# Patient Record
Sex: Male | Born: 1945
Health system: Southern US, Community
[De-identification: ages and names within clinical notes are randomized; demographics above are authoritative.]

## PROBLEM LIST (undated history)

## (undated) DIAGNOSIS — K648 Other hemorrhoids: Secondary | ICD-10-CM

## (undated) DIAGNOSIS — R7302 Impaired glucose tolerance (oral): Secondary | ICD-10-CM

## (undated) DIAGNOSIS — M109 Gout, unspecified: Secondary | ICD-10-CM

## (undated) DIAGNOSIS — I442 Atrioventricular block, complete: Secondary | ICD-10-CM

## (undated) DIAGNOSIS — I779 Disorder of arteries and arterioles, unspecified: Secondary | ICD-10-CM

## (undated) DIAGNOSIS — I2510 Atherosclerotic heart disease of native coronary artery without angina pectoris: Secondary | ICD-10-CM

## (undated) DIAGNOSIS — R51 Headache: Secondary | ICD-10-CM

## (undated) DIAGNOSIS — I10 Essential (primary) hypertension: Secondary | ICD-10-CM

## (undated) DIAGNOSIS — M1990 Unspecified osteoarthritis, unspecified site: Secondary | ICD-10-CM

## (undated) DIAGNOSIS — IMO0002 Reserved for concepts with insufficient information to code with codable children: Secondary | ICD-10-CM

## (undated) DIAGNOSIS — I739 Peripheral vascular disease, unspecified: Secondary | ICD-10-CM

## (undated) DIAGNOSIS — K219 Gastro-esophageal reflux disease without esophagitis: Secondary | ICD-10-CM

## (undated) DIAGNOSIS — R609 Edema, unspecified: Secondary | ICD-10-CM

## (undated) DIAGNOSIS — I350 Nonrheumatic aortic (valve) stenosis: Secondary | ICD-10-CM

## (undated) DIAGNOSIS — E119 Type 2 diabetes mellitus without complications: Secondary | ICD-10-CM

## (undated) DIAGNOSIS — R519 Headache, unspecified: Secondary | ICD-10-CM

## (undated) DIAGNOSIS — E785 Hyperlipidemia, unspecified: Secondary | ICD-10-CM

## (undated) DIAGNOSIS — Z86010 Personal history of colonic polyps: Secondary | ICD-10-CM

## (undated) DIAGNOSIS — Z7901 Long term (current) use of anticoagulants: Secondary | ICD-10-CM

## (undated) DIAGNOSIS — N179 Acute kidney failure, unspecified: Secondary | ICD-10-CM

## (undated) DIAGNOSIS — K5730 Diverticulosis of large intestine without perforation or abscess without bleeding: Secondary | ICD-10-CM

## (undated) DIAGNOSIS — R9430 Abnormal result of cardiovascular function study, unspecified: Secondary | ICD-10-CM

## (undated) DIAGNOSIS — Z125 Encounter for screening for malignant neoplasm of prostate: Secondary | ICD-10-CM

## (undated) DIAGNOSIS — J45909 Unspecified asthma, uncomplicated: Secondary | ICD-10-CM

## (undated) HISTORY — PX: AORTIC VALVE REPLACEMENT: SHX41

## (undated) HISTORY — PX: COLONOSCOPY: SHX174

## (undated) HISTORY — DX: Essential (primary) hypertension: I10

## (undated) HISTORY — DX: Acute kidney failure, unspecified: N17.9

## (undated) HISTORY — DX: Diverticulosis of large intestine without perforation or abscess without bleeding: K57.30

## (undated) HISTORY — DX: Edema, unspecified: R60.9

## (undated) HISTORY — DX: Long term (current) use of anticoagulants: Z79.01

## (undated) HISTORY — DX: Impaired glucose tolerance (oral): R73.02

## (undated) HISTORY — DX: Disorder of arteries and arterioles, unspecified: I77.9

## (undated) HISTORY — DX: Reserved for concepts with insufficient information to code with codable children: IMO0002

## (undated) HISTORY — DX: Hyperlipidemia, unspecified: E78.5

## (undated) HISTORY — PX: SHOULDER SURGERY: SHX246

## (undated) HISTORY — DX: Encounter for screening for malignant neoplasm of prostate: Z12.5

## (undated) HISTORY — PX: CORONARY ARTERY BYPASS GRAFT: SHX141

## (undated) HISTORY — DX: Nonrheumatic aortic (valve) stenosis: I35.0

## (undated) HISTORY — PX: CIRCUMCISION: SUR203

## (undated) HISTORY — PX: ESOPHAGOGASTRODUODENOSCOPY: SHX1529

## (undated) HISTORY — PX: HERNIA REPAIR: SHX51

## (undated) HISTORY — PX: PACEMAKER INSERTION: SHX728

## (undated) HISTORY — DX: Other hemorrhoids: K64.8

## (undated) HISTORY — DX: Abnormal result of cardiovascular function study, unspecified: R94.30

## (undated) HISTORY — DX: Atherosclerotic heart disease of native coronary artery without angina pectoris: I25.10

## (undated) HISTORY — DX: Unspecified osteoarthritis, unspecified site: M19.90

## (undated) HISTORY — PX: OTHER SURGICAL HISTORY: SHX169

## (undated) HISTORY — DX: Peripheral vascular disease, unspecified: I73.9

## (undated) HISTORY — DX: Gout, unspecified: M10.9

## (undated) HISTORY — DX: Atrioventricular block, complete: I44.2

## (undated) HISTORY — DX: Type 2 diabetes mellitus without complications: E11.9

## (undated) HISTORY — DX: Unspecified asthma, uncomplicated: J45.909

---
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
## 1997-12-10 ENCOUNTER — Encounter: Admission: RE | Admit: 1997-12-10 | Discharge: 1998-03-10 | Payer: Self-pay | Admitting: Internal Medicine

## 2000-06-20 ENCOUNTER — Ambulatory Visit (HOSPITAL_COMMUNITY): Admission: RE | Admit: 2000-06-20 | Discharge: 2000-06-20 | Payer: Self-pay | Admitting: Internal Medicine

## 2000-06-20 ENCOUNTER — Encounter: Payer: Self-pay | Admitting: Internal Medicine

## 2001-02-18 ENCOUNTER — Emergency Department (HOSPITAL_COMMUNITY): Admission: EM | Admit: 2001-02-18 | Discharge: 2001-02-18 | Payer: Self-pay | Admitting: Emergency Medicine

## 2001-02-18 ENCOUNTER — Encounter: Payer: Self-pay | Admitting: Emergency Medicine

## 2001-08-24 ENCOUNTER — Encounter: Payer: Self-pay | Admitting: Emergency Medicine

## 2001-08-24 ENCOUNTER — Emergency Department (HOSPITAL_COMMUNITY): Admission: EM | Admit: 2001-08-24 | Discharge: 2001-08-24 | Payer: Self-pay | Admitting: Emergency Medicine

## 2003-06-12 HISTORY — PX: CARDIAC CATHETERIZATION: SHX172

## 2003-06-28 ENCOUNTER — Ambulatory Visit (HOSPITAL_COMMUNITY): Admission: RE | Admit: 2003-06-28 | Discharge: 2003-06-29 | Payer: Self-pay | Admitting: Cardiology

## 2004-02-10 ENCOUNTER — Inpatient Hospital Stay (HOSPITAL_COMMUNITY): Admission: RE | Admit: 2004-02-10 | Discharge: 2004-02-28 | Payer: Self-pay | Admitting: Cardiology

## 2004-02-11 ENCOUNTER — Encounter: Payer: Self-pay | Admitting: Cardiovascular Disease

## 2004-02-18 ENCOUNTER — Encounter: Payer: Self-pay | Admitting: Thoracic Surgery (Cardiothoracic Vascular Surgery)

## 2004-02-18 ENCOUNTER — Encounter (INDEPENDENT_AMBULATORY_CARE_PROVIDER_SITE_OTHER): Payer: Self-pay | Admitting: *Deleted

## 2004-03-06 ENCOUNTER — Encounter: Admission: RE | Admit: 2004-03-06 | Discharge: 2004-03-06 | Payer: Self-pay | Admitting: Cardiology

## 2004-03-13 ENCOUNTER — Emergency Department (HOSPITAL_COMMUNITY): Admission: EM | Admit: 2004-03-13 | Discharge: 2004-03-13 | Payer: Self-pay

## 2004-03-23 ENCOUNTER — Encounter (HOSPITAL_COMMUNITY): Admission: RE | Admit: 2004-03-23 | Discharge: 2004-04-10 | Payer: Self-pay | Admitting: Cardiology

## 2004-04-13 ENCOUNTER — Encounter (HOSPITAL_COMMUNITY): Admission: RE | Admit: 2004-04-13 | Discharge: 2004-05-13 | Payer: Self-pay | Admitting: Cardiology

## 2004-05-11 ENCOUNTER — Encounter
Admission: RE | Admit: 2004-05-11 | Discharge: 2004-05-11 | Payer: Self-pay | Admitting: Thoracic Surgery (Cardiothoracic Vascular Surgery)

## 2004-05-15 ENCOUNTER — Encounter (HOSPITAL_COMMUNITY): Admission: RE | Admit: 2004-05-15 | Discharge: 2004-06-14 | Payer: Self-pay | Admitting: Cardiology

## 2004-05-27 ENCOUNTER — Ambulatory Visit: Payer: Self-pay | Admitting: Cardiology

## 2004-05-27 ENCOUNTER — Ambulatory Visit: Payer: Self-pay | Admitting: *Deleted

## 2004-06-09 ENCOUNTER — Ambulatory Visit: Payer: Self-pay | Admitting: Internal Medicine

## 2004-06-15 ENCOUNTER — Encounter (HOSPITAL_COMMUNITY): Admission: RE | Admit: 2004-06-15 | Discharge: 2004-07-10 | Payer: Self-pay | Admitting: Cardiology

## 2004-06-25 ENCOUNTER — Ambulatory Visit: Payer: Self-pay | Admitting: *Deleted

## 2004-07-23 ENCOUNTER — Ambulatory Visit: Payer: Self-pay | Admitting: Internal Medicine

## 2004-07-29 ENCOUNTER — Ambulatory Visit: Payer: Self-pay | Admitting: Internal Medicine

## 2004-08-20 ENCOUNTER — Ambulatory Visit: Payer: Self-pay | Admitting: Cardiology

## 2004-08-31 ENCOUNTER — Ambulatory Visit: Payer: Self-pay | Admitting: Internal Medicine

## 2004-09-10 ENCOUNTER — Ambulatory Visit: Payer: Self-pay | Admitting: Cardiology

## 2004-10-08 ENCOUNTER — Ambulatory Visit: Payer: Self-pay | Admitting: Internal Medicine

## 2004-10-29 ENCOUNTER — Ambulatory Visit: Payer: Self-pay | Admitting: Cardiology

## 2004-11-26 ENCOUNTER — Ambulatory Visit: Payer: Self-pay | Admitting: Cardiology

## 2004-11-27 ENCOUNTER — Ambulatory Visit: Payer: Self-pay | Admitting: Internal Medicine

## 2004-12-25 ENCOUNTER — Ambulatory Visit: Payer: Self-pay | Admitting: Cardiology

## 2005-01-15 ENCOUNTER — Ambulatory Visit: Payer: Self-pay | Admitting: Cardiology

## 2005-02-05 ENCOUNTER — Ambulatory Visit: Payer: Self-pay | Admitting: Cardiology

## 2005-03-05 ENCOUNTER — Ambulatory Visit: Payer: Self-pay | Admitting: Cardiology

## 2005-03-19 ENCOUNTER — Ambulatory Visit: Payer: Self-pay | Admitting: Internal Medicine

## 2005-04-01 ENCOUNTER — Ambulatory Visit: Payer: Self-pay | Admitting: Cardiology

## 2005-04-30 ENCOUNTER — Ambulatory Visit: Payer: Self-pay | Admitting: Internal Medicine

## 2005-04-30 ENCOUNTER — Ambulatory Visit: Payer: Self-pay | Admitting: Cardiovascular Disease

## 2005-05-27 ENCOUNTER — Ambulatory Visit: Payer: Self-pay | Admitting: Internal Medicine

## 2005-06-11 ENCOUNTER — Ambulatory Visit: Payer: Self-pay | Admitting: Internal Medicine

## 2005-06-17 ENCOUNTER — Ambulatory Visit: Payer: Self-pay | Admitting: Cardiology

## 2005-06-18 ENCOUNTER — Ambulatory Visit: Payer: Self-pay | Admitting: Internal Medicine

## 2005-07-01 ENCOUNTER — Ambulatory Visit: Payer: Self-pay | Admitting: Cardiology

## 2005-07-22 ENCOUNTER — Ambulatory Visit: Payer: Self-pay

## 2005-08-19 ENCOUNTER — Ambulatory Visit: Payer: Self-pay | Admitting: Internal Medicine

## 2005-09-16 ENCOUNTER — Ambulatory Visit: Payer: Self-pay | Admitting: Cardiology

## 2005-10-07 ENCOUNTER — Ambulatory Visit: Payer: Self-pay | Admitting: Internal Medicine

## 2005-10-14 ENCOUNTER — Ambulatory Visit: Payer: Self-pay | Admitting: Cardiology

## 2005-11-04 ENCOUNTER — Ambulatory Visit: Payer: Self-pay | Admitting: Cardiology

## 2005-12-02 ENCOUNTER — Ambulatory Visit: Payer: Self-pay | Admitting: Cardiology

## 2005-12-17 ENCOUNTER — Ambulatory Visit: Payer: Self-pay | Admitting: Internal Medicine

## 2005-12-23 ENCOUNTER — Ambulatory Visit: Payer: Self-pay | Admitting: Cardiology

## 2006-01-20 ENCOUNTER — Ambulatory Visit: Payer: Self-pay | Admitting: Cardiology

## 2006-01-24 ENCOUNTER — Ambulatory Visit: Payer: Self-pay

## 2006-02-03 ENCOUNTER — Ambulatory Visit: Payer: Self-pay | Admitting: Internal Medicine

## 2006-03-03 ENCOUNTER — Ambulatory Visit: Payer: Self-pay | Admitting: Cardiology

## 2006-03-31 ENCOUNTER — Ambulatory Visit: Payer: Self-pay | Admitting: Cardiology

## 2006-04-22 ENCOUNTER — Ambulatory Visit: Payer: Self-pay | Admitting: Internal Medicine

## 2006-04-28 ENCOUNTER — Ambulatory Visit: Payer: Self-pay | Admitting: Internal Medicine

## 2006-05-26 ENCOUNTER — Ambulatory Visit: Payer: Self-pay | Admitting: Cardiology

## 2006-06-14 ENCOUNTER — Ambulatory Visit: Payer: Self-pay | Admitting: Internal Medicine

## 2006-06-14 LAB — CONVERTED CEMR LAB
ALT: 22 units/L (ref 0–40)
AST: 28 units/L (ref 0–37)
Basophils Absolute: 0 10*3/uL (ref 0.0–0.1)
CO2: 32 meq/L (ref 19–32)
Calcium: 9.1 mg/dL (ref 8.4–10.5)
Chloride: 102 meq/L (ref 96–112)
Chol/HDL Ratio, serum: 3
Eosinophil percent: 1.2 % (ref 0.0–5.0)
Glucose, Bld: 96 mg/dL (ref 70–99)
LDL Cholesterol: 63 mg/dL (ref 0–99)
Lymphocytes Relative: 14.5 % (ref 12.0–46.0)
Monocytes Absolute: 0.7 10*3/uL (ref 0.2–0.7)
Platelets: 190 10*3/uL (ref 150–400)
Potassium: 3.9 meq/L (ref 3.5–5.1)
Sodium: 140 meq/L (ref 135–145)
Total Bilirubin: 0.9 mg/dL (ref 0.3–1.2)
VLDL: 18 mg/dL (ref 0–40)
WBC: 9.9 10*3/uL (ref 4.5–10.5)

## 2006-06-21 ENCOUNTER — Ambulatory Visit: Payer: Self-pay | Admitting: Internal Medicine

## 2006-06-23 ENCOUNTER — Ambulatory Visit: Payer: Self-pay | Admitting: Cardiology

## 2006-06-28 ENCOUNTER — Ambulatory Visit: Payer: Self-pay | Admitting: Internal Medicine

## 2006-07-21 ENCOUNTER — Ambulatory Visit: Payer: Self-pay | Admitting: Cardiology

## 2006-08-05 ENCOUNTER — Ambulatory Visit: Payer: Self-pay | Admitting: Internal Medicine

## 2006-08-12 ENCOUNTER — Ambulatory Visit: Payer: Self-pay | Admitting: Internal Medicine

## 2006-08-18 ENCOUNTER — Ambulatory Visit: Payer: Self-pay | Admitting: Cardiology

## 2006-08-29 ENCOUNTER — Ambulatory Visit: Payer: Self-pay | Admitting: Internal Medicine

## 2006-08-29 ENCOUNTER — Ambulatory Visit: Payer: Self-pay

## 2006-08-31 ENCOUNTER — Ambulatory Visit: Payer: Self-pay | Admitting: Internal Medicine

## 2006-09-08 ENCOUNTER — Ambulatory Visit: Payer: Self-pay | Admitting: *Deleted

## 2006-09-20 ENCOUNTER — Ambulatory Visit: Payer: Self-pay | Admitting: Internal Medicine

## 2006-10-06 ENCOUNTER — Ambulatory Visit: Payer: Self-pay | Admitting: Cardiology

## 2006-10-10 ENCOUNTER — Ambulatory Visit: Payer: Self-pay | Admitting: Internal Medicine

## 2006-11-03 ENCOUNTER — Ambulatory Visit: Payer: Self-pay | Admitting: Cardiology

## 2006-11-07 ENCOUNTER — Ambulatory Visit: Payer: Self-pay | Admitting: Internal Medicine

## 2006-12-01 ENCOUNTER — Ambulatory Visit: Payer: Self-pay | Admitting: Cardiology

## 2006-12-06 ENCOUNTER — Ambulatory Visit: Payer: Self-pay | Admitting: Internal Medicine

## 2006-12-29 ENCOUNTER — Ambulatory Visit: Payer: Self-pay | Admitting: Internal Medicine

## 2007-01-02 ENCOUNTER — Ambulatory Visit: Payer: Self-pay | Admitting: Internal Medicine

## 2007-01-16 ENCOUNTER — Encounter: Payer: Self-pay | Admitting: Internal Medicine

## 2007-01-16 DIAGNOSIS — M109 Gout, unspecified: Secondary | ICD-10-CM

## 2007-01-16 DIAGNOSIS — I25119 Atherosclerotic heart disease of native coronary artery with unspecified angina pectoris: Secondary | ICD-10-CM | POA: Insufficient documentation

## 2007-01-16 DIAGNOSIS — E785 Hyperlipidemia, unspecified: Secondary | ICD-10-CM | POA: Insufficient documentation

## 2007-01-16 DIAGNOSIS — I25810 Atherosclerosis of coronary artery bypass graft(s) without angina pectoris: Secondary | ICD-10-CM | POA: Insufficient documentation

## 2007-01-16 DIAGNOSIS — I10 Essential (primary) hypertension: Secondary | ICD-10-CM

## 2007-01-16 DIAGNOSIS — I2510 Atherosclerotic heart disease of native coronary artery without angina pectoris: Secondary | ICD-10-CM

## 2007-01-16 HISTORY — DX: Hyperlipidemia, unspecified: E78.5

## 2007-01-16 HISTORY — DX: Essential (primary) hypertension: I10

## 2007-01-16 HISTORY — DX: Gout, unspecified: M10.9

## 2007-01-17 ENCOUNTER — Ambulatory Visit: Payer: Self-pay | Admitting: Internal Medicine

## 2007-01-30 ENCOUNTER — Ambulatory Visit: Payer: Self-pay | Admitting: Internal Medicine

## 2007-01-30 ENCOUNTER — Ambulatory Visit: Payer: Self-pay | Admitting: Cardiology

## 2007-02-14 ENCOUNTER — Ambulatory Visit: Payer: Self-pay | Admitting: Internal Medicine

## 2007-02-20 ENCOUNTER — Ambulatory Visit: Payer: Self-pay | Admitting: Internal Medicine

## 2007-02-27 ENCOUNTER — Ambulatory Visit: Payer: Self-pay | Admitting: Internal Medicine

## 2007-03-15 ENCOUNTER — Ambulatory Visit: Payer: Self-pay | Admitting: Internal Medicine

## 2007-03-27 ENCOUNTER — Ambulatory Visit: Payer: Self-pay | Admitting: Internal Medicine

## 2007-04-13 ENCOUNTER — Ambulatory Visit: Payer: Self-pay | Admitting: Cardiology

## 2007-04-24 ENCOUNTER — Ambulatory Visit: Payer: Self-pay | Admitting: Internal Medicine

## 2007-05-11 ENCOUNTER — Ambulatory Visit: Payer: Self-pay | Admitting: Internal Medicine

## 2007-05-18 ENCOUNTER — Ambulatory Visit: Payer: Self-pay | Admitting: Internal Medicine

## 2007-05-22 ENCOUNTER — Ambulatory Visit: Payer: Self-pay | Admitting: Internal Medicine

## 2007-06-14 ENCOUNTER — Ambulatory Visit: Payer: Self-pay | Admitting: Internal Medicine

## 2007-06-19 ENCOUNTER — Ambulatory Visit: Payer: Self-pay | Admitting: Internal Medicine

## 2007-06-29 ENCOUNTER — Ambulatory Visit: Payer: Self-pay | Admitting: Cardiology

## 2007-07-17 ENCOUNTER — Ambulatory Visit: Payer: Self-pay | Admitting: Internal Medicine

## 2007-07-27 ENCOUNTER — Ambulatory Visit: Payer: Self-pay | Admitting: Internal Medicine

## 2007-08-14 ENCOUNTER — Ambulatory Visit: Payer: Self-pay

## 2007-08-24 ENCOUNTER — Ambulatory Visit: Payer: Self-pay | Admitting: Cardiology

## 2007-08-31 ENCOUNTER — Encounter: Payer: Self-pay | Admitting: Internal Medicine

## 2007-09-21 ENCOUNTER — Ambulatory Visit: Payer: Self-pay | Admitting: Cardiology

## 2007-10-16 ENCOUNTER — Ambulatory Visit: Payer: Self-pay | Admitting: Internal Medicine

## 2007-10-17 ENCOUNTER — Ambulatory Visit: Payer: Self-pay | Admitting: Internal Medicine

## 2007-10-17 DIAGNOSIS — K5730 Diverticulosis of large intestine without perforation or abscess without bleeding: Secondary | ICD-10-CM | POA: Insufficient documentation

## 2007-10-17 HISTORY — DX: Diverticulosis of large intestine without perforation or abscess without bleeding: K57.30

## 2007-10-17 LAB — CONVERTED CEMR LAB
AST: 33 units/L (ref 0–37)
Albumin: 4 g/dL (ref 3.5–5.2)
Basophils Relative: 0.1 % (ref 0.0–1.0)
Bilirubin, Direct: 0.1 mg/dL (ref 0.0–0.3)
Calcium: 9.5 mg/dL (ref 8.4–10.5)
Chloride: 106 meq/L (ref 96–112)
Cholesterol: 123 mg/dL (ref 0–200)
Eosinophils Absolute: 0.1 10*3/uL (ref 0.0–0.7)
GFR calc Af Amer: 87 mL/min
Glucose, Bld: 77 mg/dL (ref 70–99)
Hemoglobin: 14.4 g/dL (ref 13.0–17.0)
Lymphocytes Relative: 24.2 % (ref 12.0–46.0)
MCHC: 33.1 g/dL (ref 30.0–36.0)
MCV: 88.5 fL (ref 78.0–100.0)
Monocytes Relative: 8.5 % (ref 3.0–12.0)
Platelets: 155 10*3/uL (ref 150–400)
RBC: 4.9 M/uL (ref 4.22–5.81)
RDW: 13.4 % (ref 11.5–14.6)
TSH: 2.01 microintl units/mL (ref 0.35–5.50)
Total Bilirubin: 0.9 mg/dL (ref 0.3–1.2)
Triglycerides: 64 mg/dL (ref 0–149)
VLDL: 13 mg/dL (ref 0–40)

## 2007-10-19 ENCOUNTER — Ambulatory Visit: Payer: Self-pay

## 2007-11-16 ENCOUNTER — Ambulatory Visit: Payer: Self-pay | Admitting: Internal Medicine

## 2007-12-14 ENCOUNTER — Ambulatory Visit: Payer: Self-pay | Admitting: Internal Medicine

## 2008-01-10 ENCOUNTER — Ambulatory Visit: Payer: Self-pay | Admitting: Internal Medicine

## 2008-02-07 ENCOUNTER — Ambulatory Visit: Payer: Self-pay | Admitting: Cardiology

## 2008-02-16 ENCOUNTER — Ambulatory Visit: Payer: Self-pay | Admitting: Internal Medicine

## 2008-03-04 ENCOUNTER — Ambulatory Visit: Payer: Self-pay | Admitting: Internal Medicine

## 2008-03-12 ENCOUNTER — Ambulatory Visit: Payer: Self-pay | Admitting: Internal Medicine

## 2008-04-01 ENCOUNTER — Ambulatory Visit: Payer: Self-pay | Admitting: Internal Medicine

## 2008-04-04 ENCOUNTER — Ambulatory Visit: Payer: Self-pay | Admitting: Internal Medicine

## 2008-04-29 ENCOUNTER — Ambulatory Visit: Payer: Self-pay | Admitting: Cardiovascular Disease

## 2008-05-14 ENCOUNTER — Telehealth: Payer: Self-pay | Admitting: Internal Medicine

## 2008-05-17 ENCOUNTER — Ambulatory Visit: Payer: Self-pay | Admitting: Internal Medicine

## 2008-05-27 ENCOUNTER — Ambulatory Visit: Payer: Self-pay | Admitting: Cardiology

## 2008-05-28 ENCOUNTER — Telehealth: Payer: Self-pay | Admitting: Internal Medicine

## 2008-05-31 ENCOUNTER — Telehealth: Payer: Self-pay | Admitting: Internal Medicine

## 2008-06-24 ENCOUNTER — Ambulatory Visit: Payer: Self-pay | Admitting: Internal Medicine

## 2008-07-17 ENCOUNTER — Ambulatory Visit: Payer: Self-pay | Admitting: Internal Medicine

## 2008-07-17 ENCOUNTER — Telehealth: Payer: Self-pay | Admitting: Internal Medicine

## 2008-07-31 ENCOUNTER — Telehealth: Payer: Self-pay | Admitting: Internal Medicine

## 2008-08-01 ENCOUNTER — Ambulatory Visit: Payer: Self-pay | Admitting: Cardiology

## 2008-08-16 ENCOUNTER — Ambulatory Visit: Payer: Self-pay | Admitting: Internal Medicine

## 2008-08-29 ENCOUNTER — Ambulatory Visit: Payer: Self-pay | Admitting: Internal Medicine

## 2008-09-12 ENCOUNTER — Encounter: Payer: Self-pay | Admitting: Internal Medicine

## 2008-09-19 ENCOUNTER — Ambulatory Visit: Payer: Self-pay

## 2008-09-26 ENCOUNTER — Ambulatory Visit: Payer: Self-pay | Admitting: Internal Medicine

## 2008-09-27 ENCOUNTER — Ambulatory Visit: Payer: Self-pay | Admitting: Internal Medicine

## 2008-10-24 ENCOUNTER — Ambulatory Visit: Payer: Self-pay | Admitting: Cardiology

## 2008-10-24 ENCOUNTER — Ambulatory Visit: Payer: Self-pay

## 2008-11-01 DIAGNOSIS — K648 Other hemorrhoids: Secondary | ICD-10-CM

## 2008-11-01 DIAGNOSIS — Z950 Presence of cardiac pacemaker: Secondary | ICD-10-CM | POA: Insufficient documentation

## 2008-11-01 DIAGNOSIS — I442 Atrioventricular block, complete: Secondary | ICD-10-CM

## 2008-11-01 HISTORY — DX: Other hemorrhoids: K64.8

## 2008-11-13 ENCOUNTER — Ambulatory Visit: Payer: Self-pay | Admitting: Internal Medicine

## 2008-11-18 ENCOUNTER — Ambulatory Visit: Payer: Self-pay | Admitting: Internal Medicine

## 2008-11-27 ENCOUNTER — Ambulatory Visit: Payer: Self-pay | Admitting: Internal Medicine

## 2008-11-27 DIAGNOSIS — R609 Edema, unspecified: Secondary | ICD-10-CM

## 2008-11-27 HISTORY — DX: Edema, unspecified: R60.9

## 2008-12-10 ENCOUNTER — Encounter: Payer: Self-pay | Admitting: *Deleted

## 2008-12-16 ENCOUNTER — Ambulatory Visit: Payer: Self-pay | Admitting: Cardiology

## 2008-12-16 LAB — CONVERTED CEMR LAB
POC INR: 4.3
Protime: 25.1

## 2008-12-26 ENCOUNTER — Ambulatory Visit: Payer: Self-pay | Admitting: Internal Medicine

## 2008-12-26 LAB — CONVERTED CEMR LAB
POC INR: 2.4
Protime: 19

## 2008-12-28 ENCOUNTER — Ambulatory Visit: Payer: Self-pay | Admitting: Internal Medicine

## 2009-01-15 ENCOUNTER — Encounter: Payer: Self-pay | Admitting: *Deleted

## 2009-01-16 ENCOUNTER — Ambulatory Visit: Payer: Self-pay | Admitting: Cardiology

## 2009-02-13 ENCOUNTER — Ambulatory Visit: Payer: Self-pay | Admitting: Cardiology

## 2009-02-13 LAB — CONVERTED CEMR LAB: Prothrombin Time: 20.4 s

## 2009-02-18 ENCOUNTER — Encounter: Payer: Self-pay | Admitting: Cardiology

## 2009-03-06 ENCOUNTER — Telehealth: Payer: Self-pay | Admitting: Internal Medicine

## 2009-03-18 ENCOUNTER — Telehealth (INDEPENDENT_AMBULATORY_CARE_PROVIDER_SITE_OTHER): Payer: Self-pay | Admitting: *Deleted

## 2009-03-25 ENCOUNTER — Ambulatory Visit: Payer: Self-pay | Admitting: Cardiology

## 2009-03-27 ENCOUNTER — Ambulatory Visit: Payer: Self-pay | Admitting: Internal Medicine

## 2009-03-31 ENCOUNTER — Ambulatory Visit: Payer: Self-pay | Admitting: Internal Medicine

## 2009-04-22 ENCOUNTER — Ambulatory Visit: Payer: Self-pay | Admitting: Cardiology

## 2009-04-22 LAB — CONVERTED CEMR LAB: POC INR: 2.6

## 2009-05-09 ENCOUNTER — Ambulatory Visit: Payer: Self-pay | Admitting: Internal Medicine

## 2009-05-23 ENCOUNTER — Ambulatory Visit: Payer: Self-pay | Admitting: Internal Medicine

## 2009-05-23 ENCOUNTER — Ambulatory Visit: Payer: Self-pay | Admitting: Cardiology

## 2009-05-23 DIAGNOSIS — Z954 Presence of other heart-valve replacement: Secondary | ICD-10-CM

## 2009-05-23 DIAGNOSIS — Z952 Presence of prosthetic heart valve: Secondary | ICD-10-CM | POA: Insufficient documentation

## 2009-05-23 LAB — CONVERTED CEMR LAB: POC INR: 2.9

## 2009-06-19 ENCOUNTER — Ambulatory Visit: Payer: Self-pay | Admitting: Internal Medicine

## 2009-06-19 DIAGNOSIS — R42 Dizziness and giddiness: Secondary | ICD-10-CM

## 2009-06-19 LAB — CONVERTED CEMR LAB
AST: 42 units/L — ABNORMAL HIGH (ref 0–37)
Alkaline Phosphatase: 50 units/L (ref 39–117)
BUN: 16 mg/dL (ref 6–23)
Basophils Relative: 0.4 % (ref 0.0–3.0)
Bilirubin, Direct: 0 mg/dL (ref 0.0–0.3)
Creatinine, Ser: 1 mg/dL (ref 0.4–1.5)
Eosinophils Absolute: 0.1 10*3/uL (ref 0.0–0.7)
GFR calc non Af Amer: 79.71 mL/min (ref >60)
Glucose, Bld: 112 mg/dL — ABNORMAL HIGH (ref 70–99)
Hemoglobin: 14.4 g/dL (ref 13.0–17.0)
MCV: 89.4 fL (ref 78.0–100.0)
Monocytes Absolute: 0.4 10*3/uL (ref 0.1–1.0)
Neutro Abs: 3.6 10*3/uL (ref 1.4–7.7)
Platelets: 155 10*3/uL (ref 150.0–400.0)
Prothrombin Time: 21.2 s
Sodium: 140 meq/L (ref 135–145)
TSH: 1.28 microintl units/mL (ref 0.35–5.50)
Total Protein: 6.4 g/dL (ref 6.0–8.3)

## 2009-06-30 ENCOUNTER — Ambulatory Visit: Payer: Self-pay | Admitting: Internal Medicine

## 2009-07-01 ENCOUNTER — Telehealth: Payer: Self-pay | Admitting: Internal Medicine

## 2009-07-07 ENCOUNTER — Encounter (INDEPENDENT_AMBULATORY_CARE_PROVIDER_SITE_OTHER): Payer: Self-pay | Admitting: *Deleted

## 2009-07-07 ENCOUNTER — Ambulatory Visit: Payer: Self-pay | Admitting: Cardiology

## 2009-07-07 LAB — CONVERTED CEMR LAB: POC INR: 2.6

## 2009-08-04 ENCOUNTER — Ambulatory Visit: Payer: Self-pay | Admitting: Internal Medicine

## 2009-08-04 LAB — CONVERTED CEMR LAB: POC INR: 2.5

## 2009-08-21 ENCOUNTER — Encounter: Payer: Self-pay | Admitting: Cardiology

## 2009-08-21 ENCOUNTER — Encounter: Payer: Self-pay | Admitting: Internal Medicine

## 2009-08-27 ENCOUNTER — Encounter: Payer: Self-pay | Admitting: Cardiology

## 2009-08-28 ENCOUNTER — Ambulatory Visit: Payer: Self-pay | Admitting: Cardiology

## 2009-09-11 ENCOUNTER — Ambulatory Visit: Payer: Self-pay

## 2009-09-11 ENCOUNTER — Ambulatory Visit: Payer: Self-pay | Admitting: Internal Medicine

## 2009-09-11 ENCOUNTER — Encounter: Payer: Self-pay | Admitting: Cardiology

## 2009-09-11 ENCOUNTER — Ambulatory Visit (HOSPITAL_COMMUNITY): Admission: RE | Admit: 2009-09-11 | Discharge: 2009-09-11 | Payer: Self-pay | Admitting: Cardiology

## 2009-09-17 ENCOUNTER — Ambulatory Visit: Payer: Self-pay | Admitting: Internal Medicine

## 2009-09-17 LAB — CONVERTED CEMR LAB
Bilirubin Urine: NEGATIVE
Blood in Urine, dipstick: NEGATIVE
Protein, U semiquant: NEGATIVE
WBC Urine, dipstick: NEGATIVE

## 2009-09-24 ENCOUNTER — Ambulatory Visit: Payer: Self-pay | Admitting: Internal Medicine

## 2009-09-25 ENCOUNTER — Ambulatory Visit: Payer: Self-pay | Admitting: Cardiology

## 2009-09-25 LAB — CONVERTED CEMR LAB: POC INR: 3.7

## 2009-09-29 ENCOUNTER — Ambulatory Visit: Payer: Self-pay | Admitting: Internal Medicine

## 2009-10-20 LAB — CONVERTED CEMR LAB
ALT: 25 units/L (ref 0–53)
AST: 32 units/L (ref 0–37)
Albumin: 4 g/dL (ref 3.5–5.2)
BUN: 15 mg/dL (ref 6–23)
Basophils Absolute: 0 10*3/uL (ref 0.0–0.1)
Basophils Relative: 0.5 % (ref 0.0–3.0)
Bilirubin, Direct: 0 mg/dL (ref 0.0–0.3)
CO2: 30 meq/L (ref 19–32)
Chloride: 107 meq/L (ref 96–112)
Creatinine, Ser: 0.9 mg/dL (ref 0.4–1.5)
GFR calc non Af Amer: 89.95 mL/min (ref >60)
Glucose, Bld: 106 mg/dL — ABNORMAL HIGH (ref 70–99)
LDL Cholesterol: 70 mg/dL (ref 0–99)
Lymphs Abs: 1.5 10*3/uL (ref 0.7–4.0)
Monocytes Absolute: 0.4 10*3/uL (ref 0.1–1.0)
Potassium: 3.9 meq/L (ref 3.5–5.1)
Sodium: 142 meq/L (ref 135–145)
Total CHOL/HDL Ratio: 3
WBC: 5.7 10*3/uL (ref 4.5–10.5)

## 2009-10-23 ENCOUNTER — Ambulatory Visit: Payer: Self-pay | Admitting: Cardiology

## 2009-10-23 LAB — CONVERTED CEMR LAB: POC INR: 2.9

## 2009-11-07 ENCOUNTER — Telehealth: Payer: Self-pay | Admitting: Cardiology

## 2009-11-11 ENCOUNTER — Ambulatory Visit: Payer: Self-pay | Admitting: Cardiology

## 2009-11-12 ENCOUNTER — Telehealth: Payer: Self-pay | Admitting: Internal Medicine

## 2009-11-14 ENCOUNTER — Ambulatory Visit (HOSPITAL_COMMUNITY): Admission: RE | Admit: 2009-11-14 | Discharge: 2009-11-15 | Payer: Self-pay | Admitting: Surgery

## 2009-11-19 ENCOUNTER — Ambulatory Visit: Payer: Self-pay | Admitting: Cardiology

## 2009-11-24 ENCOUNTER — Ambulatory Visit: Payer: Self-pay

## 2009-11-24 ENCOUNTER — Ambulatory Visit: Payer: Self-pay | Admitting: Cardiovascular Disease

## 2009-11-27 ENCOUNTER — Encounter: Payer: Self-pay | Admitting: Internal Medicine

## 2009-12-15 ENCOUNTER — Ambulatory Visit: Payer: Self-pay | Admitting: Internal Medicine

## 2009-12-15 LAB — CONVERTED CEMR LAB: POC INR: 2.5

## 2010-01-14 ENCOUNTER — Ambulatory Visit: Payer: Self-pay | Admitting: Cardiology

## 2010-01-14 LAB — CONVERTED CEMR LAB: POC INR: 2.4

## 2010-01-26 ENCOUNTER — Ambulatory Visit: Payer: Self-pay | Admitting: Internal Medicine

## 2010-02-11 ENCOUNTER — Ambulatory Visit: Payer: Self-pay | Admitting: Cardiovascular Disease

## 2010-02-11 LAB — CONVERTED CEMR LAB: POC INR: 2.5

## 2010-03-11 ENCOUNTER — Encounter: Payer: Self-pay | Admitting: Cardiology

## 2010-03-12 ENCOUNTER — Ambulatory Visit: Payer: Self-pay | Admitting: Internal Medicine

## 2010-03-12 ENCOUNTER — Ambulatory Visit: Payer: Self-pay | Admitting: Cardiology

## 2010-03-18 ENCOUNTER — Ambulatory Visit: Payer: Self-pay | Admitting: Internal Medicine

## 2010-04-09 ENCOUNTER — Ambulatory Visit: Payer: Self-pay | Admitting: Cardiology

## 2010-04-09 LAB — CONVERTED CEMR LAB: POC INR: 2.4

## 2010-04-27 ENCOUNTER — Ambulatory Visit: Payer: Self-pay | Admitting: Internal Medicine

## 2010-05-07 ENCOUNTER — Ambulatory Visit: Payer: Self-pay | Admitting: Cardiology

## 2010-05-07 LAB — CONVERTED CEMR LAB: POC INR: 2.3

## 2010-05-08 ENCOUNTER — Encounter (INDEPENDENT_AMBULATORY_CARE_PROVIDER_SITE_OTHER): Payer: Self-pay | Admitting: *Deleted

## 2010-06-03 ENCOUNTER — Ambulatory Visit: Payer: Self-pay | Admitting: Internal Medicine

## 2010-06-23 ENCOUNTER — Ambulatory Visit: Payer: Self-pay | Admitting: Internal Medicine

## 2010-06-23 LAB — CONVERTED CEMR LAB: INR: 2.6

## 2010-07-21 ENCOUNTER — Ambulatory Visit: Admission: RE | Admit: 2010-07-21 | Discharge: 2010-07-21 | Payer: Self-pay | Source: Home / Self Care

## 2010-07-21 LAB — CONVERTED CEMR LAB: POC INR: 3

## 2010-07-27 ENCOUNTER — Encounter: Payer: Self-pay | Admitting: Internal Medicine

## 2010-08-04 ENCOUNTER — Ambulatory Visit: Payer: Self-pay | Admitting: Internal Medicine

## 2010-08-13 NOTE — Medication Information (Signed)
Summary: rov/ewj  Anticoagulant Therapy  Managed by: [name], PharmD Referring MD: Jeremy Rough MD PCP: Jeremy Heman MD: Jeremy Latch MD, [name] Indication 1: Aortic Valve Replacement (ICD-V43.3) Indication 2: Aortic Valve Disorder (ICD-424.1) Lab Used: [facility] Site: [facility] INR POC 1.5 INR RANGE 2 - 3  Dietary changes: no    Health status changes: no    Bleeding/hemorrhagic complications: no    Recent/future hospitalizations: no    Any changes in medication regimen? no    Recent/future dental: no  Any missed doses?: yes     Details: see note below  Is patient compliant with meds? yes      Comments: Pt scheduled for hernia operation this friday 11/14/09, pt took last coumadin dose this past Saturday 4/31/11.  INR today is 1.5  and patient is to begin taking lovenox injections.    Current Medications (verified): 1)  Altace 10 Mg Caps (Ramipril) .Marland Kitchen.. 1 Once Daily 2)  Aspirin Ec 81 Mg Tbec (Aspirin) .... Take 1 Tablet By Mouth Every Morning 3)  Colchicine 0.6 Mg Tabs (Colchicine) .Marland Kitchen.. 1 Two Times A Day 4)  Crestor 20 Mg Tabs (Rosuvastatin Calcium) .... Take One Tablet By Mouth Daily. 5)  Nitroquick 0.4 Mg Subl (Nitroglycerin) .... As Needed 6)  Metoprolol Succinate 100 Mg  Tb24 (Metoprolol Succinate) .... One Daily 7)  Warfarin Sodium 10 Mg Tabs (Warfarin Sodium) .... Take As Directed By Coumadin Clinic. 8)  Klor-Con M10 10 Meq  Tbcr (Potassium Chloride Crys Cr) .Marland Kitchen.. 1 Two Times A Day 9)  Amlodipine Besylate 5 Mg Tabs (Amlodipine Besylate) .... One Daily 10)  Viagra 50 Mg Tabs (Sildenafil Citrate) .... As Directed 11)  Furosemide 40 Mg Tabs (Furosemide) .... One Twice Daily 12)  Glucosamine-Chondroitin 250-200 Mg Caps (Glucosamine-Chondroitin) .... Take 1 Capsule Daily 13)  Vitamin C 500 Mg Tabs (Ascorbic Acid) .... Take 1 Tablet Daily 14)  Multivitamins   Tabs (Multiple Vitamin) .... Take 1 Tablet Daily 15)  Calcium 500 Mg Tabs (Calcium Carbonate)  .... Take 1 Tablet Two Times A Day 16)  Bonine 25 Mg Chew (Meclizine Hcl) .... Take 1 Tablet Daily 17)  Lovenox 120 Mg/0.39ml Soln (Enoxaparin Sodium) .... Discard 10 Mg.  Inject 110 Mg Subcutaneously As Directed.  Allergies (verified): 1)  ! Lopressor (Metoprolol Tartrate) 2)  Codeine Phosphate (Codeine Phosphate)  Anticoagulation Management History:      The patient is taking warfarin and comes in today for a routine follow up visit.  Positive risk factors for bleeding include an age of 67 years or older.  The bleeding index is 'intermediate risk'.  Positive CHADS2 values include History of HTN.  Negative CHADS2 values include Age > 70 years old.  The start date was 02/19/2004.  His last INR was 3.1.  Anticoagulation responsible provider: Shirlee Latch MD, [name].  INR POC: 1.5.  Cuvette Lot#: [id number].  Exp: 12/2010.    Anticoagulation Management Assessment/Plan:      The patient's current anticoagulation dose is Warfarin sodium 10 mg tabs: Take as directed by coumadin clinic..  The target INR is 2.0-3.0.  The next INR is due 11/19/2009.  Anticoagulation instructions were given to patient.  Results were reviewed/authorized by [name], PharmD.  He was notified by [name].         Prior Anticoagulation Instructions: INR 2.9  Continue on same dosage 10mg  daily.  Stop coumadin on 11/08/09 last dose then check INR on 11/11/09.  Current Anticoagulation Instructions: INR 1.5  Today 5/3 - Start Lovenox  injections today, one injection this AM and PM, NO coumadin Wed 5/4 - Lovenox in AM and PM, NO coumadin Thurs 5/5 - Lovenox in AM only, NO coumadin Friday 5/6 - Procedure Day, Lovenox in PM only, Coumadin 15 mg Sat 5/7 - Lovenox AM and PM, Coumadin 15 mg Sun 5/8 - Lovenox AM and PM, Coumadin 10 mg Mon 5/9 - Lovenox AM and PM, Coumadin 10 mg Tuesday 5/10 - Lovenox AM and PM, Coumadin 10 mg Wednesday 5/11 - Appointment    Prescriptions: LOVENOX 120 MG/0.8ML SOLN (ENOXAPARIN SODIUM)  Discard 10 mg.  Inject 110 mg subcutaneously as directed.  #14 x 0   Entered by:   [name]   Authorized by:   Jeremy Givens, MD, [facility]   Signed by:   [name] on 11/11/2009   Method used:   Electronically to        [facility]  [address] (retail)       [address]       [place], [place]  [address]       Ph: [phone]       Fax: [phone]   RxID:   [id number]

## 2010-08-13 NOTE — Progress Notes (Signed)
Summary: med question   Phone Note Call from Patient Call back at [phone]   Caller: Patient Reason for Call: Talk to Nurse Summary of Call: pt has question about when he is to start lovenox after he stops his coumadin Initial call taken by: Migdalia Dk,  November 07, 2009 10:36 AM  Follow-up for Phone Call        Spoke with pt.  He is to take his last dose of Coumadin on 4/30.  He will hold his Coumadin on 5/1 and 5/2 and we will recheck his INR on 5/3.  If his INR is <2, will start Lovenox and bridge for procedure.  Follow-up by: Weston Brass PharmD,  November 07, 2009 10:45 AM

## 2010-08-13 NOTE — Cardiovascular Report (Signed)
Summary: TTM   TTM   Imported By: Roderic Ovens 10/27/2009 11:16:15  _____________________________________________________________________  External Attachment:    Type:   Image     Comment:   External Document

## 2010-08-13 NOTE — Miscellaneous (Signed)
Summary: dx correction   Clinical Lists Changes  Problems: Changed problem from PACEMAKER (ICD-V45.Marland Kitchen01) to PACEMAKER, PERMANENT (ICD-V45.01) changed the incorrect dx code to correct dx code Genella Mech  May 08, 2010 12:24 PM

## 2010-08-13 NOTE — Miscellaneous (Signed)
  Clinical Lists Changes  Observations: Added new observation of PAST MED HX: HYPERLIPIDEMIA (ICD-272.4) HYPERTENSION (ICD-401.9) CORONARY ARTERY DISEASE (ICD-414.00) CABG... 2005 Bental procedure... August, 2005 (AVR in aortic root conduit) Aortic stenosis.... treated surgically PACEMAKER (ICD-V45..01).Marland KitchenMarland KitchenMarland KitchenAugust, 2005  Taylor AV BLOCK, COMPLETE (ICD-426.0) HEMORRHOIDS, INTERNAL (ICD-455.0) ENCOUNTER FOR THERAPEUTIC DRUG MONITORING (ICD-V58.83) SPECIAL SCREENING MALIGNANT NEOPLASM OF PROSTATE (ICD-V76.44) DIVERTICULOSIS, COLON (ICD-562.10) GOUT (ICD-274.9) Carotid artery disease.... minimal... Doppler.... August 2 05    (08/27/2009 18:02)       Past History:  Past Medical History: HYPERLIPIDEMIA (ICD-272.4) HYPERTENSION (ICD-401.9) CORONARY ARTERY DISEASE (ICD-414.00) CABG... 2005 Bental procedure... August, 2005 (AVR in aortic root conduit) Aortic stenosis.... treated surgically PACEMAKER (ICD-V45..01).Marland KitchenMarland KitchenMarland KitchenAugust, 2005  [place] AV BLOCK, COMPLETE (ICD-426.0) HEMORRHOIDS, INTERNAL (ICD-455.0) ENCOUNTER FOR THERAPEUTIC DRUG MONITORING (ICD-V58.83) SPECIAL SCREENING MALIGNANT NEOPLASM OF PROSTATE (ICD-V76.44) DIVERTICULOSIS, COLON (ICD-562.10) GOUT (ICD-274.9) Carotid artery disease.... minimal... Doppler.... August 2 05

## 2010-08-13 NOTE — Cardiovascular Report (Signed)
Summary: TTM   TTM   Imported By: Roderic Ovens 02/10/2010 14:34:17  _____________________________________________________________________  External Attachment:    Type:   Image     Comment:   External Document

## 2010-08-13 NOTE — Cardiovascular Report (Signed)
Summary: TTM   TTM   Imported By: Roderic Ovens 08/06/2009 10:40:14  _____________________________________________________________________  External Attachment:    Type:   Image     Comment:   External Document

## 2010-08-13 NOTE — Medication Information (Signed)
Summary: rov/eh  Anticoagulant Therapy  Managed by: Leota Sauers, PharmD, BCPS, CPP Referring MD: Willa Rough MD Supervising MD: Myrtis Ser MD, Tinnie Gens Indication 1: Aortic Valve Replacement (ICD-V43.3) Indication 2: Aortic Valve Disorder (ICD-424.1) Lab Used: [facility] Site: [facility] INR POC 3.3 INR RANGE 2 - 3  Dietary changes: no    Health status changes: no    Bleeding/hemorrhagic complications: no    Recent/future hospitalizations: no    Any changes in medication regimen? no    Recent/future dental: no  Any missed doses?: no       Is patient compliant with meds? yes       Allergies: 1)  ! Lopressor (Metoprolol Tartrate) 2)  Codeine Phosphate (Codeine Phosphate)  Anticoagulation Management History:      The patient is taking warfarin and comes in today for a routine follow up visit.  Positive risk factors for bleeding include an age of 23 years or older.  The bleeding index is 'intermediate risk'.  Positive CHADS2 values include History of HTN.  Negative CHADS2 values include Age > 81 years old.  The start date was 02/19/2004.  His last INR was 3.1.  Anticoagulation responsible provider: Myrtis Ser MD, Tinnie Gens.  INR POC: 3.3.  Cuvette Lot#: [id number].  Exp: 10/2010.    Anticoagulation Management Assessment/Plan:      The patient's current anticoagulation dose is Warfarin sodium 10 mg tabs: Take as directed by coumadin clinic..  The target INR is 2.0-3.0.  The next INR is due 09/25/2009.  Anticoagulation instructions were given to patient.  Results were reviewed/authorized by Leota Sauers, PharmD, BCPS, CPP.         Prior Anticoagulation Instructions: INR: 2.5 Continue with same dosage of 10mg  tablet daily  Recheck in 4 weeks  Current Anticoagulation Instructions: INR 3.3  Take 1/2 tab = 5mg  today Thur 2/17 then 1 tab = 10mg  each day

## 2010-08-13 NOTE — Medication Information (Signed)
Summary: rov coumadin - [facility]  Anticoagulant Therapy  Managed by: Leota Sauers, PharmD, BCPS, CPP Referring MD: Willa Rough MD PCP: Rayann Heman MD: Shirlee Latch MD, Verniece Encarnacion Indication 1: Aortic Valve Replacement (ICD-V43.3) Indication 2: Aortic Valve Disorder (ICD-424.1) Lab Used: [facility] Site: [facility] INR POC 3.7 INR RANGE 2 - 3  Dietary changes: no    Health status changes: no    Bleeding/hemorrhagic complications: no    Recent/future hospitalizations: yes       Details: plan hernia repair in future? when  Any changes in medication regimen? no    Recent/future dental: no  Any missed doses?: no       Is patient compliant with meds? yes       Current Medications (verified): 1)  Altace 10 Mg Caps (Ramipril) .Marland Kitchen.. 1 Once Daily 2)  Aspirin Ec 81 Mg Tbec (Aspirin) .... Take 1 Tablet By Mouth Every Morning 3)  Colchicine 0.6 Mg Tabs (Colchicine) .Marland Kitchen.. 1 Two Times A Day 4)  Crestor 20 Mg Tabs (Rosuvastatin Calcium) .... Take One Tablet By Mouth Daily. 5)  Nitroquick 0.4 Mg Subl (Nitroglycerin) .... As Needed 6)  Metoprolol Succinate 100 Mg  Tb24 (Metoprolol Succinate) .... One Daily 7)  Warfarin Sodium 10 Mg Tabs (Warfarin Sodium) .... Take As Directed By Coumadin Clinic. 8)  Klor-Con M10 10 Meq  Tbcr (Potassium Chloride Crys Cr) .Marland Kitchen.. 1 Two Times A Day 9)  Amlodipine Besylate 5 Mg Tabs (Amlodipine Besylate) .... One Daily 10)  Viagra 50 Mg Tabs (Sildenafil Citrate) .... As Directed 11)  Furosemide 40 Mg Tabs (Furosemide) .... One Twice Daily 12)  Glucosamine-Chondroitin 250-200 Mg Caps (Glucosamine-Chondroitin) .... Take 1 Capsule Daily 13)  Vitamin C 500 Mg Tabs (Ascorbic Acid) .... Take 1 Tablet Daily 14)  Multivitamins   Tabs (Multiple Vitamin) .... Take 1 Tablet Daily 15)  Calcium 500 Mg Tabs (Calcium Carbonate) .... Take 1 Tablet Two Times A Day 16)  Bonine 25 Mg Chew (Meclizine Hcl) .... Take 1 Tablet Daily  Allergies (verified): 1)  ! Lopressor  (Metoprolol Tartrate) 2)  Codeine Phosphate (Codeine Phosphate)  Anticoagulation Management History:      The patient is taking warfarin and comes in today for a routine follow up visit.  Positive risk factors for bleeding include an age of 67 years or older.  The bleeding index is 'intermediate risk'.  Positive CHADS2 values include History of HTN.  Negative CHADS2 values include Age > 47 years old.  The start date was 02/19/2004.  His last INR was 3.1.  Anticoagulation responsible provider: Shirlee Latch MD, Solyana Nonaka.  INR POC: 3.7.  Cuvette Lot#: [id number].  Exp: 10/2010.    Anticoagulation Management Assessment/Plan:      The patient's current anticoagulation dose is Warfarin sodium 10 mg tabs: Take as directed by coumadin clinic..  The target INR is 2.0-3.0.  The next INR is due 10/23/2009.  Anticoagulation instructions were given to patient.  Results were reviewed/authorized by Leota Sauers, PharmD, BCPS, CPP.         Prior Anticoagulation Instructions: INR 3.3  Take 1/2 tab = 5mg  today Thur 2/17 then 1 tab = 10mg  each day  Current Anticoagulation Instructions: INR 3.7  eat greens tonight 1/2 tab = 5mg  today Thur 3/17 then 1 tab = 10mg  each day

## 2010-08-13 NOTE — Procedures (Signed)
Summary: pc2      Allergies Added:   Current Medications (verified): 1)  Altace 10 Mg Caps (Ramipril) .Marland Kitchen.. 1 Once Daily 2)  Aspirin Ec 81 Mg Tbec (Aspirin) .... Take 1 Tablet By Mouth Every Morning 3)  Colchicine 0.6 Mg Tabs (Colchicine) .Marland Kitchen.. 1 Two Times A Day 4)  Crestor 20 Mg Tabs (Rosuvastatin Calcium) .... Take One Tablet By Mouth Daily. 5)  Nitroquick 0.4 Mg Subl (Nitroglycerin) .... As Needed 6)  Metoprolol Succinate 100 Mg  Tb24 (Metoprolol Succinate) .... One Daily 7)  Warfarin Sodium 10 Mg Tabs (Warfarin Sodium) .... Take As Directed By Coumadin Clinic. 8)  Klor-Con M10 10 Meq  Tbcr (Potassium Chloride Crys Cr) .Marland Kitchen.. 1 Two Times A Day 9)  Amlodipine Besylate 5 Mg Tabs (Amlodipine Besylate) .... One Daily 10)  Viagra 50 Mg Tabs (Sildenafil Citrate) .... As Directed 11)  Furosemide 40 Mg Tabs (Furosemide) .... One Twice Daily 12)  Glucosamine-Chondroitin 250-200 Mg Caps (Glucosamine-Chondroitin) .... Take 1 Capsule Daily 13)  Vitamin C 500 Mg Tabs (Ascorbic Acid) .... Take 1 Tablet Daily 14)  Multivitamins   Tabs (Multiple Vitamin) .... Take 1 Tablet Daily 15)  Calcium 500 Mg Tabs (Calcium Carbonate) .... Take 1 Tablet Two Times A Day 16)  Bonine 25 Mg Chew (Meclizine Hcl) .... Take 1 Tablet Daily  Allergies (verified): 1)  ! Lopressor (Metoprolol Tartrate) 2)  Codeine Phosphate (Codeine Phosphate)   PPM Specifications Following MD:  Lewayne Bunting, MD     PPM Vendor:  [employer] Jude     PPM Model Number:  [id number]     PPM Serial Number:  [id number] PPM DOI:  02/25/2004      Lead 1    Location: [facility]     DOI: 02/25/2004     Model #: [id number]     Serial #: [id number]     Status: active Lead 2    Location: RV     DOI: 02/25/2004     Model #: [id number]     Serial #: [id number]      Magnet Response Rate:  BOL  98.6 ERI  86.3  Indications:  CHB  Explantation Comments:  TTM's with [facility]  PPM Follow Up Remote Check?  No Battery Voltage:  2.80 V     Battery Est. Longevity:  6.25years     Pacer Dependent:  Yes        PPM Device Measurements Atrium  Amplitude: 2.2 mV, Impedance: 547 ohms, Threshold: 1.0 V at 0.5 msec Right Ventricle  Impedance: 580 ohms, Threshold: 0.5 V at 0.5 msec  Episodes MS Episodes:  7     Percent Mode Switch:  <1%     Coumadin:  Yes Atrial Pacing:  59%     Ventricular Pacing:  100%  Parameters Mode:  DDDR     Lower Rate Limit:  60     Upper Rate Limit:  120 Paced AV Delay:  250     Sensed AV Delay:  225 Next Cardiology Appt Due:  05/12/2010 Tech Comments:  Rate response adjusted for blunted histogram.  TTM's with [employer].  ROV 6 months with Dr. Ladona Ridgel. Altha Harm, LPN  Nov 24, 2009 11:34 AM  MD Comments:  Agree with above.

## 2010-08-13 NOTE — Miscellaneous (Signed)
  Clinical Lists Changes  Observations: Added new observation of PAST MED HX: HYPERLIPIDEMIA (ICD-272.4) HYPERTENSION (ICD-401.9) CORONARY ARTERY DISEASE (ICD-414.00) EF  50-55%.... echo... March, 2011... distal inferior and posterior hypokinesis and apical hypokinesis CABG... 2005 Bental procedure... August,.... 2005...( aVR in aortic root conduit)  /  echo... March, 2011... prosthetic valve working well Aortic stenosis... treated surgically PACEMAKER (ICD-V45..01).Marland KitchenMarland KitchenAugust, 2005... Dr. Ladona Ridgel Carotid artery disease minimal... Doppler... August 2 005 AV BLOCK, COMPLETE (ICD-426.0) HEMORRHOIDS, INTERNAL (ICD-455.0) ENCOUNTER FOR THERAPEUTIC DRUG MONITORING (ICD-V58.83) SPECIAL SCREENING MALIGNANT NEOPLASM OF PROSTATE (ICD-V76.44) DIVERTICULOSIS, COLON (ICD-562.10) GOUT (ICD-274.9)    (03/11/2010 13:53) Added new observation of REFERRING MD: Tsuei (03/11/2010 13:53) Added new observation of PRIMARY MD: Amador Cunas (03/11/2010 13:53)       Past History:  Past Medical History: HYPERLIPIDEMIA (ICD-272.4) HYPERTENSION (ICD-401.9) CORONARY ARTERY DISEASE (ICD-414.00) EF  50-55%.... echo... March, 2011... distal inferior and posterior hypokinesis and apical hypokinesis CABG... 2005 Bental procedure... August,.... 2005...( aVR in aortic root conduit)  /  echo... March, 2011... prosthetic valve working well Aortic stenosis... treated surgically PACEMAKER (ICD-V45..01).Marland KitchenMarland KitchenAugust, 2005... Dr. Ladona Ridgel Carotid artery disease minimal... Doppler... August 2 005 AV BLOCK, COMPLETE (ICD-426.0) HEMORRHOIDS, INTERNAL (ICD-455.0) ENCOUNTER FOR THERAPEUTIC DRUG MONITORING (ICD-V58.83) SPECIAL SCREENING MALIGNANT NEOPLASM OF PROSTATE (ICD-V76.44) DIVERTICULOSIS, COLON (ICD-562.10) GOUT (ICD-274.9)

## 2010-08-13 NOTE — Medication Information (Signed)
Summary: rov/tm  Anticoagulant Therapy  Managed by: Weston Brass, PharmD Referring MD: Willa Rough MD PCP: Rayann Heman MD: Eden Emms MD, Theron Arista Indication 1: Aortic Valve Replacement (ICD-V43.3) Indication 2: Aortic Valve Disorder (ICD-424.1) Lab Used: [facility] Site: [facility] INR POC 2.5 INR RANGE 2 - 3  Dietary changes: no    Health status changes: no    Bleeding/hemorrhagic complications: no    Recent/future hospitalizations: no    Any changes in medication regimen? no    Recent/future dental: no  Any missed doses?: no       Is patient compliant with meds? yes       Allergies: 1)  ! Lopressor (Metoprolol Tartrate) 2)  Codeine Phosphate (Codeine Phosphate)  Anticoagulation Management History:      The patient is taking warfarin and comes in today for a routine follow up visit.  Positive risk factors for bleeding include an age of 59 years or older.  The bleeding index is 'intermediate risk'.  Positive CHADS2 values include History of HTN.  Negative CHADS2 values include Age > 34 years old.  The start date was 02/19/2004.  His last INR was 3.1.  Anticoagulation responsible [place]: Eden Emms MD, Theron Arista.  INR POC: 2.5.  Cuvette Lot#: [id number].  Exp: 04/2011.    Anticoagulation Management Assessment/Plan:      The patient's current anticoagulation dose is Warfarin sodium 10 mg tabs: Take as directed by coumadin clinic..  The target INR is 2.0-3.0.  The next INR is due 03/11/2010.  Anticoagulation instructions were given to patient.  Results were reviewed/authorized by Weston Brass, PharmD.  He was notified by Gweneth Fritter, PharmD Candidate.         Prior Anticoagulation Instructions: INR 2.4 Continue 10mg s daily. Recheck in 4 weeks.   Current Anticoagulation Instructions: INR- 2.5  Continue taking 1 tablet (10mg ) every day

## 2010-08-13 NOTE — Assessment & Plan Note (Signed)
Summary: 6 MONTH ROV/NJR/PTS WIFE RSC/CJR   Vital Signs:  Patient profile:   65 year old male Weight:      253 pounds Temp:     98.2 degrees F oral BP sitting:   124 / 80  (right arm) Cuff size:   regular  Vitals Entered By: Duard Brady LPN (March 18, 2010 7:57 AM) CC: 6 mos rov - ok Is Patient Diabetic? No   Primary Care Provider:  Amador Cunas  CC:  6 mos rov - ok.  History of Present Illness: 53 -year-old patient, history of aortic valve repair on chronic Coumadin.  He is doing quite well and has had a recent cardiology appointment.  His only complaint today is lower extremity edema.  This has been an intermittent problem.  The past.  Blood pressure has been well controlled.  Blood pressure regimen includes amlodipine 5 mg daily.  He denies any symptoms of heart failure. He has a history of gout, which has been stable.  He has treated dyslipidemia, and a history of coronary artery disease.  He denies any exertional chest pain ; remains on Crestor, which he continues to tolerate well  Allergies: 1)  ! Lopressor (Metoprolol Tartrate) 2)  Codeine Phosphate (Codeine Phosphate)  Past History:  Past Medical History: Reviewed history from 03/12/2010 and no changes required. HYPERLIPIDEMIA (ICD-272.4) HYPERTENSION (ICD-401.9) CORONARY ARTERY DISEASE (ICD-414.00) EF  50-55%.... echo... March, 2011... distal inferior and posterior hypokinesis and apical hypokinesis CABG... 2005 Bental procedure... August,.... 2005...( aVR in aortic root conduit)  /  echo... March, 2011... prosthetic valve working well Aortic stenosis... treated surgically PACEMAKER (ICD-V45..01).Marland KitchenMarland KitchenAugust, 2005... Dr. Ladona Ridgel Carotid artery disease minimal... Doppler... August 2 005 AV BLOCK, COMPLETE (ICD-426.0) HEMORRHOIDS, INTERNAL (ICD-455.0) ENCOUNTER FOR THERAPEUTIC DRUG MONITORING (ICD-V58.83) SPECIAL SCREENING MALIGNANT NEOPLASM OF PROSTATE (ICD-V76.44) DIVERTICULOSIS, COLON (ICD-562.10) GOUT  (ICD-274.9)    Past Surgical History: Reviewed history from 11/01/2008 and no changes required. Coronary artery bypass graft August 2005 Aortic valve repair August 2005 Permanent pacemaker - Doylene Canning. Ladona Ridgel, M.D. - 02/25/2004 - complete heart block Circumcision Shoulder surgery status post PCI with stenting December 2004 colonoscopy  February 2008, upper panendoscopy, February 2008  Review of Systems       The patient complains of peripheral edema.  The patient denies anorexia, fever, weight loss, weight gain, vision loss, decreased hearing, hoarseness, chest pain, syncope, dyspnea on exertion, prolonged cough, headaches, hemoptysis, abdominal pain, melena, hematochezia, severe indigestion/heartburn, hematuria, incontinence, genital sores, muscle weakness, suspicious skin lesions, transient blindness, difficulty walking, depression, unusual weight change, abnormal bleeding, enlarged lymph nodes, angioedema, breast masses, and testicular masses.    Physical Exam  General:  overweight-appearing.  118/72overweight-appearing.   Head:  Normocephalic and atraumatic without obvious abnormalities. No apparent alopecia or balding. Eyes:  No corneal or conjunctival inflammation noted. EOMI. Perrla. Funduscopic exam benign, without hemorrhages, exudates or papilledema. Vision grossly normal. Mouth:  Oral mucosa and oropharynx without lesions or exudates.  Teeth in good repair. Neck:  No deformities, masses, or tenderness noted. Lungs:  Normal respiratory effort, chest expands symmetrically. Lungs are clear to auscultation, no crackles or wheezes. Heart:  prosthetic heart sounds.  No murmur Abdomen:  Bowel sounds positive,abdomen soft and non-tender without masses, organomegaly or hernias noted. Msk:  No deformity or scoliosis noted of thoracic or lumbar spine.   Extremities:  2+ left pedal edema and 2+ right pedal edema.  2+ left pedal edema and 2+ right pedal edema.   Skin:  Intact without  suspicious lesions or  rashes Cervical Nodes:  No lymphadenopathy noted   Impression & Recommendations:  Problem # 1:  AORTIC VALVE REPLACEMENT, HX OF (ICD-V43.3)  Problem # 2:  LEG EDEMA (ICD-782.3)  His updated medication list for this problem includes:    Furosemide 40 Mg Tabs (Furosemide) ..... One twice daily will discontinue amlodipine, continue aggressive salt restriction, and observed.  He will continue to monitor blood pressures at home closely  His updated medication list for this problem includes:    Furosemide 40 Mg Tabs (Furosemide) ..... One twice daily  Problem # 3:  HYPERLIPIDEMIA (ICD-272.4)  His updated medication list for this problem includes:    Crestor 20 Mg Tabs (Rosuvastatin calcium) .Marland Kitchen... Take one tablet by mouth daily.  His updated medication list for this problem includes:    Crestor 20 Mg Tabs (Rosuvastatin calcium) .Marland Kitchen... Take one tablet by mouth daily.  Problem # 4:  HYPERTENSION (ICD-401.9)  The following medications were removed from the medication list:    Amlodipine Besylate 5 Mg Tabs (Amlodipine besylate) ..... One daily His updated medication list for this problem includes:    Altace 10 Mg Caps (Ramipril) .Marland Kitchen... 1 once daily    Metoprolol Succinate 100 Mg Tb24 (Metoprolol succinate) ..... One daily    Furosemide 40 Mg Tabs (Furosemide) ..... One twice daily  The following medications were removed from the medication list:    Amlodipine Besylate 5 Mg Tabs (Amlodipine besylate) ..... One daily His updated medication list for this problem includes:    Altace 10 Mg Caps (Ramipril) .Marland Kitchen... 1 once daily    Metoprolol Succinate 100 Mg Tb24 (Metoprolol succinate) ..... One daily    Furosemide 40 Mg Tabs (Furosemide) ..... One twice daily  Problem # 5:  CORONARY ARTERY DISEASE (ICD-414.00)  The following medications were removed from the medication list:    Amlodipine Besylate 5 Mg Tabs (Amlodipine besylate) ..... One daily His updated medication  list for this problem includes:    Altace 10 Mg Caps (Ramipril) .Marland Kitchen... 1 once daily    Aspirin Ec 81 Mg Tbec (Aspirin) .Marland Kitchen... Take 1 tablet by mouth every morning    Nitroquick 0.4 Mg Subl (Nitroglycerin) .Marland Kitchen... As needed    Metoprolol Succinate 100 Mg Tb24 (Metoprolol succinate) ..... One daily    Furosemide 40 Mg Tabs (Furosemide) ..... One twice daily  The following medications were removed from the medication list:    Amlodipine Besylate 5 Mg Tabs (Amlodipine besylate) ..... One daily His updated medication list for this problem includes:    Altace 10 Mg Caps (Ramipril) .Marland Kitchen... 1 once daily    Aspirin Ec 81 Mg Tbec (Aspirin) .Marland Kitchen... Take 1 tablet by mouth every morning    Nitroquick 0.4 Mg Subl (Nitroglycerin) .Marland Kitchen... As needed    Metoprolol Succinate 100 Mg Tb24 (Metoprolol succinate) ..... One daily    Furosemide 40 Mg Tabs (Furosemide) ..... One twice daily  Complete Medication List: 1)  Altace 10 Mg Caps (Ramipril) .Marland Kitchen.. 1 once daily 2)  Aspirin Ec 81 Mg Tbec (Aspirin) .... Take 1 tablet by mouth every morning 3)  Crestor 20 Mg Tabs (Rosuvastatin calcium) .... Take one tablet by mouth daily. 4)  Nitroquick 0.4 Mg Subl (Nitroglycerin) .... As needed 5)  Metoprolol Succinate 100 Mg Tb24 (Metoprolol succinate) .... One daily 6)  Warfarin Sodium 10 Mg Tabs (Warfarin sodium) .... Take as directed by coumadin clinic. 7)  Klor-con M10 10 Meq Tbcr (Potassium chloride crys cr) .Marland Kitchen.. 1 two times a day 8)  Cialis 10  Mg Tabs (Tadalafil) .... As needed 9)  Furosemide 40 Mg Tabs (Furosemide) .... One twice daily 10)  Glucosamine-chondroitin 250-200 Mg Caps (Glucosamine-chondroitin) .... Take 1 capsule daily 11)  Vitamin C 500 Mg Tabs (Ascorbic acid) .... Take 1 tablet daily 12)  Multivitamins Tabs (Multiple vitamin) .... Take 1 tablet daily 13)  Calcium 500 Mg Tabs (Calcium carbonate) .... Take 1 tablet two times a day 14)  Bonine 25 Mg Chew (Meclizine hcl) .... Take 1 tablet daily 15)  Fish Oil Oil  (Fish oil) .... Once daily  Patient Instructions: 1)  Please schedule a follow-up appointment in 6 months. 2)  Limit your Sodium (Salt). 3)  It is important that you exercise regularly at least 20 minutes 5 times a week. If you develop chest pain, have severe difficulty breathing, or feel very tired , stop exercising immediately and seek medical attention. 4)  You need to lose weight. Consider a lower calorie diet and regular exercise.  Prescriptions: FUROSEMIDE 40 MG TABS (FUROSEMIDE) one twice daily  #180 x 6   Entered and Authorized by:   Gordy Savers  MD   Signed by:   Gordy Savers  MD on 03/18/2010   Method used:   Faxed to ...       [facility] Environmental education officer)       [address] [address]       [place], [place]  [address]       Ph: [phone]       Fax: [phone]   RxID:   [id number] KLOR-CON M10 10 MEQ  TBCR (POTASSIUM CHLORIDE CRYS CR) 1 two times a day  #180 x 6   Entered and Authorized by:   Gordy Savers  MD   Signed by:   Gordy Savers  MD on 03/18/2010   Method used:   Faxed to ...       [facility] Environmental education officer)       [address] [address]       [place], [place]  [address]       Ph: [phone]       Fax: [phone]   RxID:   [id number] METOPROLOL SUCCINATE 100 MG  TB24 (METOPROLOL SUCCINATE) one daily  #90 x 6   Entered and Authorized by:   Gordy Savers  MD   Signed by:   Gordy Savers  MD on 03/18/2010   Method used:   Faxed to ...       [facility] Environmental education officer)       [address] [address]       [place], [place]  [address]       Ph: [phone]       Fax: [phone]   RxID:   [id number] CRESTOR 20 MG TABS (ROSUVASTATIN CALCIUM) Take one tablet by mouth daily.  #90 x 6   Entered and Authorized by:   Gordy Savers  MD   Signed by:   Gordy Savers  MD on 03/18/2010   Method used:   Faxed to ...       [facility] Environmental education officer)       [address] [address]       [place], [place]  [address]       Ph: [phone]       Fax:  [phone]   RxID:   [id number] ALTACE 10 MG CAPS (RAMIPRIL) 1 once daily  #90 x 6   Entered and Authorized by:   Gordy Savers  MD   Signed by:  Gordy Savers  MD on 03/18/2010   Method used:   Faxed to ...       [facility] Environmental education officer)       [address] [address]       [place], [place]  [address]       Ph: [phone]       Fax: [phone]   RxID:   [id number] FUROSEMIDE 40 MG TABS (FUROSEMIDE) one twice daily  #180 x 6   Entered and Authorized by:   Gordy Savers  MD   Signed by:   Gordy Savers  MD on 03/18/2010   Method used:   Print then Give to Patient   RxID:   [id number] KLOR-CON M10 10 MEQ  TBCR (POTASSIUM CHLORIDE CRYS CR) 1 two times a day  #180 x 6   Entered and Authorized by:   Gordy Savers  MD   Signed by:   Gordy Savers  MD on 03/18/2010   Method used:   Print then Give to Patient   RxID:   [id number] METOPROLOL SUCCINATE 100 MG  TB24 (METOPROLOL SUCCINATE) one daily  #90 x 6   Entered and Authorized by:   Gordy Savers  MD   Signed by:   Gordy Savers  MD on 03/18/2010   Method used:   Print then Give to Patient   RxID:   [id number] CRESTOR 20 MG TABS (ROSUVASTATIN CALCIUM) Take one tablet by mouth daily.  #90 x 6   Entered and Authorized by:   Gordy Savers  MD   Signed by:   Gordy Savers  MD on 03/18/2010   Method used:   Print then Give to Patient   RxID:   [id number] ALTACE 10 MG CAPS (RAMIPRIL) 1 once daily  #90 x 6   Entered and Authorized by:   Gordy Savers  MD   Signed by:   Gordy Savers  MD on 03/18/2010   Method used:   Print then Give to Patient   RxID:   [id number]

## 2010-08-13 NOTE — Progress Notes (Signed)
Summary:  refill  Warfarin to [facility]  Phone Note Call from Patient Call back at Home Phone [phone]   Caller: spouse-Jeanetta Summary of Call: Pt is needing a refill of Warfarin to [facility]. Please fax [phone]   ID# [phone] Initial call taken by: Lucy Antigua,  Nov 12, 2009 1:12 PM    Prescriptions: WARFARIN SODIUM 10 MG TABS (WARFARIN SODIUM) Take as directed by coumadin clinic.  #30 x 3   Entered by:   Duard Brady LPN   Authorized by:   Gordy Savers  MD   Signed by:   Duard Brady LPN on 29/52/8413   Method used:   Faxed to ...       [facility] Environmental education officer)       [address] [address]       [place], [place]  [address]       Ph: [phone]       Fax: [phone]   RxID:   [id number]

## 2010-08-13 NOTE — Medication Information (Signed)
Summary: Coumadin Clinic   Anticoagulant Therapy  Managed by: Weston Brass, PharmD Referring MD: Willa Rough MD PCP: Rayann Heman MD: Jens Som MD, Arlys John Indication 1: Aortic Valve Replacement (ICD-V43.3) Indication 2: Aortic Valve Disorder (ICD-424.1) Lab Used: [facility] Site: [facility] INR POC 2.3 INR RANGE 2 - 3  Dietary changes: no    Health status changes: no    Bleeding/hemorrhagic complications: no    Recent/future hospitalizations: no    Any changes in medication regimen? no    Recent/future dental: no  Any missed doses?: no       Is patient compliant with meds? yes       Allergies: 1)  ! Lopressor (Metoprolol Tartrate) 2)  Codeine Phosphate (Codeine Phosphate)  Anticoagulation Management History:      The patient is taking warfarin and comes in today for a routine follow up visit.  Positive risk factors for bleeding include an age of 67 years or older.  The bleeding index is 'intermediate risk'.  Positive CHADS2 values include History of HTN.  Negative CHADS2 values include Age > 80 years old.  The start date was 02/19/2004.  His last INR was 3.1.  Anticoagulation responsible Linette Gunderson: Jens Som MD, Arlys John.  INR POC: 2.3.  Cuvette Lot#: [id number].  Exp: 05/2011.    Anticoagulation Management Assessment/Plan:      The patient's current anticoagulation dose is Warfarin sodium 10 mg tabs: Take as directed by coumadin clinic..  The target INR is 2.0-3.0.  The next INR is due 06/04/2010.  Anticoagulation instructions were given to patient.  Results were reviewed/authorized by Weston Brass, PharmD.  He was notified by Ilean Skill D candidate.         Prior Anticoagulation Instructions: INR 2.4  Continue taking one tablet every day.  Recheck in four weeks.   Current Anticoagulation Instructions: INR 2.3  Continue taking same dose of 1 tablet everyday. Recheck in 4 weeks.

## 2010-08-13 NOTE — Assessment & Plan Note (Signed)
Summary: f80m  Medications Added CIALIS 10 MG TABS (TADALAFIL) as needed FISH OIL   OIL (FISH OIL) once daily      Allergies Added:   Visit Type:  Follow-up Referring Provider:  Corliss Skains Primary Provider:  Amador Cunas  CC:  aortic valve replacement.  History of Present Illness: The patient is seen for followup of aortic valve replacement.  He does have coronary disease.  Is not having any chest pain. patient had a Bental  procedure with an aortic root conduit and a mechanical aortic valve.  This was done in 2005. he takes his Coumadin reliably and is quite stable.  Current Medications (verified): 1)  Altace 10 Mg Caps (Ramipril) .Marland Kitchen.. 1 Once Daily 2)  Aspirin Ec 81 Mg Tbec (Aspirin) .... Take 1 Tablet By Mouth Every Morning 3)  Colchicine 0.6 Mg Tabs (Colchicine) .Marland Kitchen.. 1 Two Times A Day 4)  Crestor 20 Mg Tabs (Rosuvastatin Calcium) .... Take One Tablet By Mouth Daily. 5)  Nitroquick 0.4 Mg Subl (Nitroglycerin) .... As Needed 6)  Metoprolol Succinate 100 Mg  Tb24 (Metoprolol Succinate) .... One Daily 7)  Warfarin Sodium 10 Mg Tabs (Warfarin Sodium) .... Take As Directed By Coumadin Clinic. 8)  Klor-Con M10 10 Meq  Tbcr (Potassium Chloride Crys Cr) .Marland Kitchen.. 1 Two Times A Day 9)  Amlodipine Besylate 5 Mg Tabs (Amlodipine Besylate) .... One Daily 10)  Cialis 10 Mg Tabs (Tadalafil) .... As Needed 11)  Furosemide 40 Mg Tabs (Furosemide) .... One Twice Daily 12)  Glucosamine-Chondroitin 250-200 Mg Caps (Glucosamine-Chondroitin) .... Take 1 Capsule Daily 13)  Vitamin C 500 Mg Tabs (Ascorbic Acid) .... Take 1 Tablet Daily 14)  Multivitamins   Tabs (Multiple Vitamin) .... Take 1 Tablet Daily 15)  Calcium 500 Mg Tabs (Calcium Carbonate) .... Take 1 Tablet Two Times A Day 16)  Bonine 25 Mg Chew (Meclizine Hcl) .... Take 1 Tablet Daily 17)  Fish Oil   Oil (Fish Oil) .... Once Daily  Allergies (verified): 1)  ! Lopressor (Metoprolol Tartrate) 2)  Codeine Phosphate (Codeine Phosphate)  Past  History:  Past Medical History: HYPERLIPIDEMIA (ICD-272.4) HYPERTENSION (ICD-401.9) CORONARY ARTERY DISEASE (ICD-414.00) EF  50-55%.... echo... March, 2011... distal inferior and posterior hypokinesis and apical hypokinesis CABG... 2005 Bental procedure... August,.... 2005...( aVR in aortic root conduit)  /  echo... March, 2011... prosthetic valve working well Aortic stenosis... treated surgically PACEMAKER (ICD-V45..01).Marland KitchenMarland KitchenAugust, 2005... Dr. Ladona Ridgel Carotid artery disease minimal... Doppler... August 2 005 AV BLOCK, COMPLETE (ICD-426.0) HEMORRHOIDS, INTERNAL (ICD-455.0) ENCOUNTER FOR THERAPEUTIC DRUG MONITORING (ICD-V58.83) SPECIAL SCREENING MALIGNANT NEOPLASM OF PROSTATE (ICD-V76.44) DIVERTICULOSIS, COLON (ICD-562.10) GOUT (ICD-274.9)    Review of Systems       Patient denies fever, chills, headache, sweats, rash, change in vision, change in hearing, chest pain, cough, nausea vomiting, urinary symptoms.  All of the systems are reviewed and are negative  Vital Signs:  Patient profile:   65 year old male Height:      67.25 inches Weight:      252 pounds BMI:     39.32 Pulse rate:   65 / minute BP sitting:   136 / 72  (left arm) Cuff size:   regular  Vitals Entered By: Hardin Negus, RMA (March 12, 2010 2:56 PM)  Physical Exam  General:  patient looks great. Eyes:  no xanthelasma. Neck:  no jugular distention. Lungs:  lungs are clear.  Respiratory effort is nonlabored. Heart:  cardiac exam reveals S1-S2.  the aortic closure sound is crisp.  No aortic insufficiency  is heard. Abdomen:  abdomen is soft. Extremities:  no peripheral edema. Psych:  patient is oriented to person time and place.  Affect is normal.   PPM Specifications Following MD:  Lewayne Bunting, MD     PPM Vendor:  [employer]     PPM Model Number:  [id number]     PPM Serial Number:  [id number] PPM DOI:  02/25/2004      Lead 1    Location: [facility]     DOI: 02/25/2004     Model #: [id number]     Serial #: [id number]     Status:  active Lead 2    Location: [facility]     DOI: 02/25/2004     Model #: [id number]     Serial #: [id number]      Magnet Response Rate:  BOL  98.6 ERI  86.3  Indications:  CHB  Explantation Comments:  TTM's with [employer]  PPM Follow Up Pacer Dependent:  Yes      Episodes Coumadin:  Yes  Parameters Mode:  DDDR     Lower Rate Limit:  60     Upper Rate Limit:  120 Paced AV Delay:  250     Sensed AV Delay:  225  Impression & Recommendations:  Problem # 1:  DIZZINESS (ICD-780.4) He is not having any dizziness.  Problem # 2:  COUMADIN THERAPY (ICD-V58.61) patient continues Coumadin and stable.  Problem # 3:  HYPERTENSION (ICD-401.9)  His updated medication list for this problem includes:    Altace 10 Mg Caps (Ramipril) .Marland Kitchen... 1 once daily    Aspirin Ec 81 Mg Tbec (Aspirin) .Marland Kitchen... Take 1 tablet by mouth every morning    Metoprolol Succinate 100 Mg Tb24 (Metoprolol succinate) ..... One daily    Amlodipine Besylate 5 Mg Tabs (Amlodipine besylate) ..... One daily    Furosemide 40 Mg Tabs (Furosemide) ..... One twice daily blood pressure is controlled.  No change in therapy.  Problem # 4:  AORTIC VALVE REPLACEMENT, HX OF (ICD-V43.3) We know that his aortic prosthesis is working well from his echo done last year.  Her bowel sound is crisp.  No further workup.  Patient Instructions: 1)  Your physician recommends that you continue on your current medications as directed. Please refer to the Current Medication list given to you today. 2)  Your physician wants you to follow-up in: 1 YEAR  You will receive a reminder letter in the mail two months in advance. If you don't receive a letter, please call our office to schedule the follow-up appointment.

## 2010-08-13 NOTE — Cardiovascular Report (Signed)
Summary: Office Visit   Office Visit   Imported By: Roderic Ovens 07/01/2010 09:21:18  _____________________________________________________________________  External Attachment:    Type:   Image     Comment:   External Document

## 2010-08-13 NOTE — Medication Information (Signed)
Summary: rov/jk   Anticoagulant Therapy  Managed by: Cloyde Reams, RN, BSN Referring MD: Willa Rough MD PCP: Rayann Heman MD: Eden Emms MD, Theron Arista Indication 1: Aortic Valve Replacement (ICD-V43.3) Indication 2: Aortic Valve Disorder (ICD-424.1) Lab Used: [facility]  Site: [facility] INR POC 2.9 INR RANGE 2 - 3  Dietary changes: no    Health status changes: no    Bleeding/hemorrhagic complications: no    Recent/future hospitalizations: no    Any changes in medication regimen? no    Recent/future dental: no  Any missed doses?: no       Is patient compliant with meds? yes       Allergies: 1)  ! Lopressor (Metoprolol Tartrate) 2)  Codeine Phosphate (Codeine Phosphate)  Anticoagulation Management History:      The patient is taking warfarin and comes in today for a routine follow up visit.  Positive risk factors for bleeding include an age of 20 years or older.  The bleeding index is 'intermediate risk'.  Positive CHADS2 values include History of HTN.  Negative CHADS2 values include Age > 31 years old.  The start date was 02/19/2004.  His last INR was 3.1.  Anticoagulation responsible provider: Eden Emms MD, Theron Arista.  INR POC: 2.9.  Cuvette Lot#: [id number].  Exp: 04/2011.    Anticoagulation Management Assessment/Plan:      The patient's current anticoagulation dose is Warfarin sodium 10 mg tabs: Take as directed by coumadin clinic..  The target INR is 2.0-3.0.  The next INR is due 04/09/2010.  Anticoagulation instructions were given to patient.  Results were reviewed/authorized by Cloyde Reams, RN, BSN.  He was notified by Cloyde Reams RN.         Prior Anticoagulation Instructions: INR- 2.5  Continue taking 1 tablet (10mg ) every day   Current Anticoagulation Instructions: INR 2.9  Continue on same dosage 10mg  daily.  Recheck in 4 weeks.

## 2010-08-13 NOTE — Consult Note (Signed)
Summary: [facility]   Imported By: Maryln Gottron 09/03/2009 09:47:30  _____________________________________________________________________  External Attachment:    Type:   Image     Comment:   External Document

## 2010-08-13 NOTE — Medication Information (Signed)
Summary: rov.mp  Anticoagulant Therapy  Managed by: Shelby Dubin, PharmD, BCPS, CPP Referring MD: Willa Rough MD Supervising MD: Tenny Craw MD, Gunnar Fusi Indication 1: Aortic Valve Replacement (ICD-V43.3) Indication 2: Aortic Valve Disorder (ICD-424.1) Lab Used: [facility] Site: [facility] INR POC 2.5 INR RANGE 2 - 3  Dietary changes: no    Health status changes: no    Bleeding/hemorrhagic complications: no    Recent/future hospitalizations: no    Any changes in medication regimen? no    Recent/future dental: no  Any missed doses?: no       Is patient compliant with meds? yes       Allergies (verified): 1)  ! Lopressor (Metoprolol Tartrate) 2)  Codeine Phosphate (Codeine Phosphate)  Anticoagulation Management History:      The patient is taking warfarin and comes in today for a routine follow up visit.  Positive risk factors for bleeding include an age of 67 years or older.  The bleeding index is 'intermediate risk'.  Positive CHADS2 values include History of HTN.  Negative CHADS2 values include Age > 33 years old.  The start date was 02/19/2004.  His last INR was 3.1.  Anticoagulation responsible provider: Tenny Craw MD, Gunnar Fusi.  INR POC: 2.5.  Cuvette Lot#: [id number].  Exp: 08/2010.    Anticoagulation Management Assessment/Plan:      The patient's current anticoagulation dose is Warfarin sodium 10 mg tabs: Take as directed by coumadin clinic..  The target INR is 2.0-3.0.  The next INR is due 09/01/2009.  Anticoagulation instructions were given to patient.  Results were reviewed/authorized by Shelby Dubin, PharmD, BCPS, CPP.  He was notified by Ysidro Evert, Pharm D Candidate.         Prior Anticoagulation Instructions: INR 2.6  Continue 1 tab daily.  Recheck in 4 weeks.    Current Anticoagulation Instructions: INR: 2.5 Continue with same dosage of 10mg  tablet daily  Recheck in 4 weeks

## 2010-08-13 NOTE — Medication Information (Signed)
Summary: rov/eac  Anticoagulant Therapy  Managed by: Eda Keys, PharmD Referring MD: Willa Rough MD PCP: Rayann Heman MD: Shirlee Latch MD, Freida Busman Indication 1: Aortic Valve Replacement (ICD-V43.3) Indication 2: Aortic Valve Disorder (ICD-424.1) Lab Used: [facility] Site: [facility] INR POC 1.2 INR RANGE 2 - 3  Dietary changes: no    Health status changes: no    Bleeding/hemorrhagic complications: no    Recent/future hospitalizations: no    Any changes in medication regimen? no    Recent/future dental: no  Any missed doses?: no       Is patient compliant with meds? yes      Comments: Hernia operation this past 11/14/09, pt remained in hospital overnight, and resumed coumadin on 11/15/09.  Pt was told by surgeon that he did not need to take lovenox post-op.    Allergies: 1)  ! Lopressor (Metoprolol Tartrate) 2)  Codeine Phosphate (Codeine Phosphate)  Anticoagulation Management History:      The patient is taking warfarin and comes in today for a routine follow up visit.  Positive risk factors for bleeding include an age of 20 years or older.  The bleeding index is 'intermediate risk'.  Positive CHADS2 values include History of HTN.  Negative CHADS2 values include Age > 66 years old.  The start date was 02/19/2004.  His last INR was 3.1.  Anticoagulation responsible provider: Shirlee Latch MD, [place].  INR POC: 1.2.  Cuvette Lot#: [id number].  Exp: 02/2011.    Anticoagulation Management Assessment/Plan:      The patient's current anticoagulation dose is Warfarin sodium 10 mg tabs: Take as directed by coumadin clinic..  The target INR is 2.0-3.0.  The next INR is due 11/26/2009.  Anticoagulation instructions were given to patient.  Results were reviewed/authorized by Eda Keys, PharmD.  He was notified by Eda Keys.         Prior Anticoagulation Instructions: INR 1.5  Today 5/3 - Start Lovenox injections today, one injection this AM and PM, NO coumadin Wed 5/4  - Lovenox in AM and PM, NO coumadin Thurs 5/5 - Lovenox in AM only, NO coumadin Friday 5/6 - Procedure Day, Lovenox in PM only, Coumadin 15 mg Sat 5/7 - Lovenox AM and PM, Coumadin 15 mg Sun 5/8 - Lovenox AM and PM, Coumadin 10 mg Mon 5/9 - Lovenox AM and PM, Coumadin 10 mg Tuesday 5/10 - Lovenox AM and PM, Coumadin 10 mg Wednesday 5/11 - Appointment     Current Anticoagulation Instructions: INR 1.2  Take 1.5 tablets (15 mg) today and tomorrow.  Then return to normal  dosing schedule of 1 tablet (10 mg) daily.  Return to clinic in 1 week.

## 2010-08-13 NOTE — Medication Information (Signed)
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 no    Recent/future dental: no  Any missed doses?: no       Is patient compliant with meds? yes      Comments: Pt requested to come back the same time as his appointment with w/ Dr. Ladona Ridgel  Allergies: 1)  ! Lopressor (Metoprolol Tartrate) 2)  Codeine Phosphate (Codeine Phosphate)  Anticoagulation Management History:      The patient is taking warfarin and comes in today for a routine follow up visit.  Positive risk factors for bleeding include an age of 67 years or older.  The bleeding index is 'intermediate risk'.  Positive CHADS2 values include History of HTN.  Negative CHADS2 values include Age > 44 years old.  The start date was 02/19/2004.  His last INR was 3.1.  Anticoagulation responsible provider: Bensimhon MD, Reuel Boom.  INR POC: 2.6.  Cuvette Lot#: [id number].  Exp: 06/2011.    Anticoagulation Management Assessment/Plan:      The patient's current anticoagulation dose is Warfarin sodium 10 mg tabs: Take as directed by coumadin clinic..  The target INR is 2.0-3.0.  The next INR is due 06/23/2010.  Anticoagulation instructions were given to patient.  Results were reviewed/authorized by Weston Brass, PharmD.  He was notified by Hoy Register, PharmD Candidate.         Prior Anticoagulation Instructions: INR 2.3  Continue taking same dose of 1 tablet everyday. Recheck in 4 weeks.   Current Anticoagulation Instructions: INR 2.6 Continue previous dose of 1 tablet everyday Recheck INR  in 4 weeks

## 2010-08-13 NOTE — Medication Information (Signed)
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 yes       Allergies: 1)  ! Lopressor (Metoprolol Tartrate) 2)  Codeine Phosphate (Codeine Phosphate)  Anticoagulation Management History:      The patient is taking warfarin and comes in today for a routine follow up visit.  Positive risk factors for bleeding include an age of 67 years or older.  The bleeding index is 'intermediate risk'.  Positive CHADS2 values include History of HTN.  Negative CHADS2 values include Age > 67 years old.  The start date was 02/19/2004.  His last INR was 2.6.  Anticoagulation responsible provider: Laruth Hanger, MD.  INR POC: 3.0.  Cuvette Lot#: [id number].  Exp: 08/2011.    Anticoagulation Management Assessment/Plan:      The patient's current anticoagulation dose is Warfarin sodium 10 mg tabs: Take as directed by coumadin clinic..  The target INR is 2.0-3.0.  The next INR is due 08/18/2010.  Anticoagulation instructions were given to patient.  Results were reviewed/authorized by Weston Brass, PharmD.  He was notified by Stephannie Peters, PharmD Candidate .         Prior Anticoagulation Instructions: INR 2.6 The patient is to continue with the same dose of coumadin.  This dosage includes:  take 1 tablet (10mg ) every day recheck INR in 4 weeks  Current Anticoagulation Instructions: INR 3.0  Coumadin 10 mg tablets - Continue 1 tablet every day

## 2010-08-13 NOTE — Assessment & Plan Note (Signed)
Summary: pc2    Visit Type:  Follow-up Referring Provider:  Corliss Skains Primary Provider:  Amador Cunas   History of Present Illness: [name] returns today for followup.  He is a 65 yo man with a h/o AS, s/p Bentall procedure in 2005 complicated by the development of CHB.  He has done well since then.  He denies c/p, sob, peripheral edema or syncope. No other complaints today.   Current Medications (verified): 1)  Altace 10 Mg Caps (Ramipril) .Marland Kitchen.. 1 Once Daily 2)  Aspirin Ec 81 Mg Tbec (Aspirin) .... Take 1 Tablet By Mouth Every Morning 3)  Crestor 20 Mg Tabs (Rosuvastatin Calcium) .... Take One Tablet By Mouth Daily. 4)  Nitroquick 0.4 Mg Subl (Nitroglycerin) .... As Needed 5)  Metoprolol Succinate 100 Mg  Tb24 (Metoprolol Succinate) .... One Daily 6)  Warfarin Sodium 10 Mg Tabs (Warfarin Sodium) .... Take As Directed By Coumadin Clinic. 7)  Klor-Con M10 10 Meq  Tbcr (Potassium Chloride Crys Cr) .Marland Kitchen.. 1 Two Times A Day 8)  Cialis 10 Mg Tabs (Tadalafil) .... As Needed 9)  Furosemide 40 Mg Tabs (Furosemide) .... One Twice Daily 10)  Glucosamine-Chondroitin 250-200 Mg Caps (Glucosamine-Chondroitin) .... Take 1 Capsule Daily 11)  Vitamin C 500 Mg Tabs (Ascorbic Acid) .... Take 1 Tablet Daily 12)  Multivitamins   Tabs (Multiple Vitamin) .... Take 1 Tablet Daily 13)  Calcium 500 Mg Tabs (Calcium Carbonate) .... Take 1 Tablet Two Times A Day 14)  Bonine 25 Mg Chew (Meclizine Hcl) .... Take 1 Tablet Daily 15)  Fish Oil   Oil (Fish Oil) .... Once Daily  Allergies: 1)  ! Lopressor (Metoprolol Tartrate) 2)  Codeine Phosphate (Codeine Phosphate)  Past History:  Past Medical History: Last updated: 03/12/2010 HYPERLIPIDEMIA (ICD-272.4) HYPERTENSION (ICD-401.9) CORONARY ARTERY DISEASE (ICD-414.00) EF  50-55%.... echo... March, 2011... distal inferior and posterior hypokinesis and apical hypokinesis CABG... 2005 Bental procedure... August,.... 2005...( aVR in aortic root conduit)  /  echo...  March, 2011... prosthetic valve working well Aortic stenosis... treated surgically PACEMAKER (ICD-V45..01).Marland KitchenMarland KitchenAugust, 2005... Dr. Ladona Ridgel Carotid artery disease minimal... Doppler... August 2 005 AV BLOCK, COMPLETE (ICD-426.0) HEMORRHOIDS, INTERNAL (ICD-455.0) ENCOUNTER FOR THERAPEUTIC DRUG MONITORING (ICD-V58.83) SPECIAL SCREENING MALIGNANT NEOPLASM OF PROSTATE (ICD-V76.44) DIVERTICULOSIS, COLON (ICD-562.10) GOUT (ICD-274.9)    Past Surgical History: Last updated: 11/01/2008 Coronary artery bypass graft August 2005 Aortic valve repair August 2005 Permanent pacemaker - Doylene Canning. Ladona Ridgel, M.D. - 02/25/2004 - complete heart block Circumcision Shoulder surgery status post PCI with stenting December 2004 colonoscopy  February 2008, upper panendoscopy, February 2008  Review of Systems  The patient denies chest pain, syncope, dyspnea on exertion, and peripheral edema.    Vital Signs:  Patient profile:   65 year old male Height:      67.25 inches Weight:      254 pounds BMI:     39.63 Pulse rate:   61 / minute BP sitting:   120 / 80  (right arm)  Vitals Entered By: [name] CMA (June 23, 2010 4:08 PM)  Physical Exam  General:  overweight-appearing.  118/72overweight-appearing.   Head:  Normocephalic and atraumatic without obvious abnormalities. No apparent alopecia or balding. Eyes:  No corneal or conjunctival inflammation noted. EOMI. Perrla. Funduscopic exam benign, without hemorrhages, exudates or papilledema. Vision grossly normal. Mouth:  Oral mucosa and oropharynx without lesions or exudates.  Teeth in good repair. Neck:  No deformities, masses, or tenderness noted. Chest Wall:  No deformities, masses, tenderness or gynecomastia noted. sternotomy scar. Well  healed PPM incision. Lungs:  Normal respiratory effort, chest expands symmetrically. Lungs are clear to auscultation, no crackles or wheezes. Heart:  prosthetic heart sounds.  No murmur Mechanical S2. Abdomen:   Bowel sounds positive,abdomen soft and non-tender without masses, organomegaly or hernias noted. Msk:  No deformity or scoliosis noted of thoracic or lumbar spine.   Pulses:  pedal pulses intact Extremities:  2+ left pedal edema and 2+ right pedal edema.  2+ left pedal edema and 2+ right pedal edema.   Neurologic:  Alert and oriented x 3.   PPM Specifications Following MD:  Lewayne Bunting, MD     PPM Vendor:  [employer]     PPM Model Number:  [id number]     PPM Serial Number:  [id number] PPM DOI:  02/25/2004      Lead 1    Location: [facility]     DOI: 02/25/2004     Model #: [id number]     Serial #: [id number]     Status: active Lead 2    Location: [facility]     DOI: 02/25/2004     Model #: [id number]     Serial #: [id number]      Magnet Response Rate:  BOL  98.6 ERI  86.3  Indications:  CHB  Explantation Comments:  TTM's with [employer]  PPM Follow Up Remote Check?  No Battery Voltage:  2.78 V     Battery Est. Longevity:  5.25 years     Pacer Dependent:  Yes       PPM Device Measurements Atrium  Amplitude: 2.2 mV, Impedance: 547 ohms, Threshold: 1.0 V at 0.5 msec Right Ventricle  Impedance: 566 ohms, Threshold: 0.5 V at 0.5 msec  Episodes MS Episodes:  4     Percent Mode Switch:  <1%     Coumadin:  Yes Atrial Pacing:  53%     Ventricular Pacing:  100%  Parameters Mode:  DDDR     Lower Rate Limit:  60     Upper Rate Limit:  120 Paced AV Delay:  250     Sensed AV Delay:  225 Next Cardiology Appt Due:  12/11/2010 Tech Comments:  No parameter changes.  Device function normal.  TTM's with [employer].  ROV 6 months clinic. Altha Harm, LPN  June 23, 2010 4:12 PM  MD Comments:  Agree with above.  Impression & Recommendations:  Problem # 1:  PACEMAKER, PERMANENT (ICD-V45.01) His device is working normally.  will followup in several months.  Problem # 2:  HYPERTENSION (ICD-401.9) His blood pressure is well controlled. Continue meds as below. His updated medication list for this problem includes:    Altace 10 Mg Caps (Ramipril)  .Marland Kitchen... 1 once daily    Aspirin Ec 81 Mg Tbec (Aspirin) .Marland Kitchen... Take 1 tablet by mouth every morning    Metoprolol Succinate 100 Mg Tb24 (Metoprolol succinate) ..... One daily    Furosemide 40 Mg Tabs (Furosemide) ..... One twice daily  Problem # 3:  CORONARY ARTERY DISEASE (ICD-414.00) He denies c/p or sob. Continue meds as below.  His updated medication list for this problem includes:    Altace 10 Mg Caps (Ramipril) .Marland Kitchen... 1 once daily    Aspirin Ec 81 Mg Tbec (Aspirin) .Marland Kitchen... Take 1 tablet by mouth every morning    Nitroquick 0.4 Mg Subl (Nitroglycerin) .Marland Kitchen... As needed    Metoprolol Succinate 100 Mg Tb24 (Metoprolol succinate) ..... One daily    Warfarin Sodium 10 Mg Tabs (Warfarin sodium) .Marland Kitchen... Take as  directed by coumadin clinic.  Patient Instructions: 1)  Your physician wants you to follow-up in: 12 months with Dr Court Joy will receive a reminder letter in the mail two months in advance. If you don't receive a letter, please call our office to schedule the follow-up appointment.

## 2010-08-13 NOTE — Medication Information (Signed)
Summary: rov/sp  Anticoagulant Therapy  Managed by: Weston Brass, PharmD Referring MD: Willa Rough MD PCP: Rayann Heman MD: Tenny Craw MD, Gunnar Fusi Indication 1: Aortic Valve Replacement (ICD-V43.3) Indication 2: Aortic Valve Disorder (ICD-424.1) Lab Used: [facility] Site: [facility] INR POC 2.5 INR RANGE 2 - 3  Dietary changes: no    Health status changes: no    Bleeding/hemorrhagic complications: no    Recent/future hospitalizations: no    Any changes in medication regimen? no    Recent/future dental: no  Any missed doses?: no       Is patient compliant with meds? yes       Allergies: 1)  ! Lopressor (Metoprolol Tartrate) 2)  Codeine Phosphate (Codeine Phosphate)  Anticoagulation Management History:      The patient is taking warfarin and comes in today for a routine follow up visit.  Positive risk factors for bleeding include an age of 67 years or older.  The bleeding index is 'intermediate risk'.  Positive CHADS2 values include History of HTN.  Negative CHADS2 values include Age > 81 years old.  The start date was 02/19/2004.  His last INR was 3.1.  Anticoagulation responsible provider: Tenny Craw MD, Gunnar Fusi.  INR POC: 2.5.  Cuvette Lot#: [id number].  Exp: 02/2011.    Anticoagulation Management Assessment/Plan:      The patient's current anticoagulation dose is Warfarin sodium 10 mg tabs: Take as directed by coumadin clinic..  The target INR is 2.0-3.0.  The next INR is due 01/14/2010.  Anticoagulation instructions were given to patient.  Results were reviewed/authorized by Weston Brass, PharmD.  He was notified by Weston Brass PharmD.         Prior Anticoagulation Instructions: INR 2.2  Continue same dose of 1 tablet every day   Current Anticoagulation Instructions: INR 2.5  Continue same dose of 1 tablet every day

## 2010-08-13 NOTE — Cardiovascular Report (Signed)
Summary: TTM   TTM   Imported By: Roderic Ovens 05/12/2010 15:19:45  _____________________________________________________________________  External Attachment:    Type:   Image     Comment:   External Document

## 2010-08-13 NOTE — Medication Information (Signed)
Summary: rov/nb   Anticoagulant Therapy  Managed by: [name], PharmD Referring MD: Jeremy Rough MD PCP: Jeremy Heman MD: Jeremy Romney MD, [name] Indication 1: Aortic Valve Replacement (ICD-V43.3) Indication 2: Aortic Valve Disorder (ICD-424.1) Lab Used: [facility] Site: [facility] INR RANGE 2 - 3  Dietary changes: no    Health status changes: no    Bleeding/hemorrhagic complications: no    Recent/future hospitalizations: no    Any changes in medication regimen? no    Recent/future dental: no  Any missed doses?: no       Is patient compliant with meds? yes       Allergies: 1)  ! Lopressor (Metoprolol Tartrate) 2)  Codeine Phosphate (Codeine Phosphate)  Anticoagulation Management History:      Positive risk factors for bleeding include an age of 67 years or older.  The bleeding index is 'intermediate risk'.  Positive CHADS2 values include History of HTN.  Negative CHADS2 values include Age > 43 years old.  The start date was 02/19/2004.  His last INR was 3.1 and today's INR is 2.6.  Anticoagulation responsible [name]: Bensimhon MD, [name].  Cuvette Lot#: [id number].  Exp: 07/2011.    Anticoagulation Management Assessment/Plan:      The patient's current anticoagulation dose is Warfarin sodium 10 mg tabs: Take as directed by coumadin clinic..  The target INR is 2.0-3.0.  The next INR is due 07/21/2010.  Anticoagulation instructions were given to patient.  Results were reviewed/authorized by [name], PharmD.         Prior Anticoagulation Instructions: INR 2.6 Continue previous dose of 1 tablet everyday Recheck INR in 4 weeks  Current Anticoagulation Instructions: INR 2.6 The patient is to continue with the same dose of coumadin.  This dosage includes:  take 1 tablet (10mg ) every day recheck INR in 4 weeks

## 2010-08-13 NOTE — Assessment & Plan Note (Signed)
Summary: cpx/njr   Vital Signs:  Patient profile:   65 year old male Height:      67.25 inches Weight:      241 pounds Temp:     98.5 degrees F oral BP sitting:   122 / 70  (left arm) Cuff size:   regular  Vitals Entered By: Duard Brady LPN (September 24, 2009 8:54 AM) CC: cpx - doing well Is Patient Diabetic? No   Primary Care Provider:  Amador Cunas  CC:  cpx - doing well.  History of Present Illness: 65 year old patient who is seen today for an  annual clinical exam.  He is followed by cardiology and has a history of complete heart block status post pacemaker insertion history of coronary artery disease and aortic valve replacement.  Surgery.  He has  done quite well.  He has  had a recent 2-D echocardiogram preoperatively for elective right inguinal hernia repair.  His right inguinal hernia remains symptomatic.  Denies any cardiotonic complaints.  He remains on chronic Coumadin therapy and follow the Coumadin clinic.  He is on statin therapy  Allergies: 1)  ! Lopressor (Metoprolol Tartrate) 2)  Codeine Phosphate (Codeine Phosphate)  Past History:  Past Medical History: Reviewed history from 08/28/2009 and no changes required. HYPERLIPIDEMIA (ICD-272.4) HYPERTENSION (ICD-401.9) CORONARY ARTERY DISEASE (ICD-414.00) CABG... 2005 Bental procedure... August,.... 2005...( aVR in aortic root conduit) Aortic stenosis... treated surgically PACEMAKER (ICD-V45..01).Marland KitchenMarland KitchenAugust, 2005... Dr. Ladona Ridgel Carotid artery disease minimal... Doppler... August 2 005 AV BLOCK, COMPLETE (ICD-426.0) HEMORRHOIDS, INTERNAL (ICD-455.0) ENCOUNTER FOR THERAPEUTIC DRUG MONITORING (ICD-V58.83) SPECIAL SCREENING MALIGNANT NEOPLASM OF PROSTATE (ICD-V76.44) DIVERTICULOSIS, COLON (ICD-562.10) GOUT (ICD-274.9)    Past Surgical History: Reviewed history from 11/01/2008 and no changes required. Coronary artery bypass graft August 2005 Aortic valve repair August 2005 Permanent pacemaker - Doylene Canning.  Ladona Ridgel, M.D. - 02/25/2004 - complete heart block Circumcision Shoulder surgery status post PCI with stenting December 2004 colonoscopy  February 2008, upper panendoscopy, February 2008  Family History: Reviewed history from 05/18/2007 and no changes required. thefather at age 33 MI and also had renal cancer mother's health unknown one brother history type 2 diabetes Positive  for hypertension, total two brothers and 3 sisters died age 12 (one brother)  Social History: Reviewed history from 11/01/2008 and no changes required. Full Time Married  Tobacco Use - Former.  1970 Alcohol Use - no  Review of Systems  The patient denies anorexia, fever, weight loss, weight gain, vision loss, decreased hearing, hoarseness, chest pain, syncope, dyspnea on exertion, peripheral edema, prolonged cough, headaches, hemoptysis, abdominal pain, melena, hematochezia, severe indigestion/heartburn, hematuria, incontinence, genital sores, muscle weakness, suspicious skin lesions, transient blindness, difficulty walking, depression, unusual weight change, abnormal bleeding, enlarged lymph nodes, angioedema, breast masses, and testicular masses.    Physical Exam  General:  overweight-appearing.  130/80overweight-appearing.   Head:  Normocephalic and atraumatic without obvious abnormalities. No apparent alopecia or balding. Eyes:  No corneal or conjunctival inflammation noted. EOMI. Perrla. Funduscopic exam benign, without hemorrhages, exudates or papilledema. Vision grossly normal. Ears:  External ear exam shows no significant lesions or deformities.  Otoscopic examination reveals clear canals, tympanic membranes are intact bilaterally without bulging, retraction, inflammation or discharge. Hearing is grossly normal bilaterally. Nose:  External nasal examination shows no deformity or inflammation. Nasal mucosa are pink and moist without lesions or exudates. Mouth:  Oral mucosa and oropharynx without lesions or  exudates.  Teeth in good repair. Neck:  No deformities, masses, or tenderness noted. Chest Wall:  No deformities,  masses, tenderness or gynecomastia noted. sternotomy scar Breasts:  No masses or gynecomastia noted Lungs:  Normal respiratory effort, chest expands symmetrically. Lungs are clear to auscultation, no crackles or wheezes. Heart:  regular prosthetic heart sounds no significant  murmur Abdomen:  obese soft and nontender.  No organomegaly. A right inguinal hernia noted Rectal:  small external hemorrhoid Genitalia:  Testes bilaterally descended without nodularity, tenderness or masses. No scrotal masses or lesions. No penis lesions or urethral discharge. Prostate:  1+ enlarged.  prominent left lobe Msk:  No deformity or scoliosis noted of thoracic or lumbar spine.   Pulses:  pedal pulses intact Extremities:  No clubbing, cyanosis, edema, or deformity noted with normal full range of motion of all joints.   Neurologic:  alert & oriented X3, cranial nerves II-XII intact, strength normal in all extremities, sensation intact to light touch, sensation intact to pinprick, gait normal, and DTRs symmetrical and normal.  alert & oriented X3, cranial nerves II-XII intact, strength normal in all extremities, sensation intact to light touch, sensation intact to pinprick, gait normal, and DTRs symmetrical and normal.   Skin:  Intact without suspicious lesions or rashes Cervical Nodes:  No lymphadenopathy noted Axillary Nodes:  No palpable lymphadenopathy Inguinal Nodes:  No significant adenopathy Psych:  Cognition and judgment appear intact. Alert and cooperative with normal attention span and concentration. No apparent delusions, illusions, hallucinations   Impression & Recommendations:  Problem # 1:  Preventive Health Care (ICD-V70.0)  Complete Medication List: 1)  Altace 10 Mg Caps (Ramipril) .Marland Kitchen.. 1 once daily 2)  Aspirin Ec 81 Mg Tbec (Aspirin) .... Take 1 tablet by mouth every  morning 3)  Colchicine 0.6 Mg Tabs (Colchicine) .Marland Kitchen.. 1 two times a day 4)  Crestor 20 Mg Tabs (Rosuvastatin calcium) .... Take one tablet by mouth daily. 5)  Nitroquick 0.4 Mg Subl (Nitroglycerin) .... As needed 6)  Metoprolol Succinate 100 Mg Tb24 (Metoprolol succinate) .... One daily 7)  Warfarin Sodium 10 Mg Tabs (Warfarin sodium) .... Take as directed by coumadin clinic. 8)  Klor-con M10 10 Meq Tbcr (Potassium chloride crys cr) .Marland Kitchen.. 1 two times a day 9)  Amlodipine Besylate 5 Mg Tabs (Amlodipine besylate) .... One daily 10)  Viagra 50 Mg Tabs (Sildenafil citrate) .... As directed 11)  Furosemide 40 Mg Tabs (Furosemide) .... One twice daily 12)  Glucosamine-chondroitin 250-200 Mg Caps (Glucosamine-chondroitin) .... Take 1 capsule daily 13)  Vitamin C 500 Mg Tabs (Ascorbic acid) .... Take 1 tablet daily 14)  Multivitamins Tabs (Multiple vitamin) .... Take 1 tablet daily 15)  Calcium 500 Mg Tabs (Calcium carbonate) .... Take 1 tablet two times a day 16)  Bonine 25 Mg Chew (Meclizine hcl) .... Take 1 tablet daily  Patient Instructions: 1)  Please schedule a follow-up appointment in 6 months. 2)  Limit your Sodium (Salt). 3)  It is important that you exercise regularly at least 20 minutes 5 times a week. If you develop chest pain, have severe difficulty breathing, or feel very tired , stop exercising immediately and seek medical attention. 4)  You need to lose weight. Consider a lower calorie diet and regular exercise.  Prescriptions: FUROSEMIDE 40 MG TABS (FUROSEMIDE) one twice daily  #180 x 6   Entered and Authorized by:   Gordy Savers  MD   Signed by:   Gordy Savers  MD on 09/24/2009   Method used:   Print then Give to Patient   RxID:   [id number] VIAGRA 50 MG TABS (  SILDENAFIL CITRATE) as directed  #12 x 6   Entered and Authorized by:   Gordy Savers  MD   Signed by:   Gordy Savers  MD on 09/24/2009   Method used:   Print then Give to Patient   RxID:    [id number] AMLODIPINE BESYLATE 5 MG TABS (AMLODIPINE BESYLATE) one daily  #90 x 6   Entered and Authorized by:   Gordy Savers  MD   Signed by:   Gordy Savers  MD on 09/24/2009   Method used:   Print then Give to Patient   RxID:   [id number] KLOR-CON M10 10 MEQ  TBCR (POTASSIUM CHLORIDE CRYS CR) 1 two times a day  #180 x 6   Entered and Authorized by:   Gordy Savers  MD   Signed by:   Gordy Savers  MD on 09/24/2009   Method used:   Print then Give to Patient   RxID:   [id number] METOPROLOL SUCCINATE 100 MG  TB24 (METOPROLOL SUCCINATE) one daily  #90 x 6   Entered and Authorized by:   Gordy Savers  MD   Signed by:   Gordy Savers  MD on 09/24/2009   Method used:   Print then Give to Patient   RxID:   [id number] CRESTOR 20 MG TABS (ROSUVASTATIN CALCIUM) Take one tablet by mouth daily.  #90 x 6   Entered and Authorized by:   Gordy Savers  MD   Signed by:   Gordy Savers  MD on 09/24/2009   Method used:   Print then Give to Patient   RxID:   [id number] ALTACE 10 MG CAPS (RAMIPRIL) 1 once daily  #90 x 6   Entered and Authorized by:   Gordy Savers  MD   Signed by:   Gordy Savers  MD on 09/24/2009   Method used:   Print then Give to Patient   RxID:   [id number]

## 2010-08-13 NOTE — Medication Information (Signed)
Summary: [facility] coumadin - [facility]  Anticoagulant Therapy  Managed by: Cloyde Reams, RN, BSN Referring MD: Willa Rough MD PCP: Rayann Heman MD: Jens Som MD, Arlys John Indication 1: Aortic Valve Replacement (ICD-V43.3) Indication 2: Aortic Valve Disorder (ICD-424.1) Lab Used: [facility] Site: [facility] INR POC 2.9 INR RANGE 2 - 3  Dietary changes: no    Health status changes: no    Bleeding/hemorrhagic complications: no    Recent/future hospitalizations: yes       Details: Hernia surgery scheduled for 11/14/09.    Any changes in medication regimen? no    Recent/future dental: no  Any missed doses?: no       Is patient compliant with meds? yes      Comments: Pt has been cleared for surgery by Dr Myrtis Ser.  Will need Lovenox bridge while off coumadin.    Allergies: 1)  ! Lopressor (Metoprolol Tartrate) 2)  Codeine Phosphate (Codeine Phosphate)  Anticoagulation Management History:      The patient is taking warfarin and comes in today for a routine follow up visit.  Positive risk factors for bleeding include an age of 67 years or older.  The bleeding index is 'intermediate risk'.  Positive CHADS2 values include History of HTN.  Negative CHADS2 values include Age > 35 years old.  The start date was 02/19/2004.  His last INR was 3.1.  Anticoagulation responsible provider: Jens Som MD, Arlys John.  INR POC: 2.9.  Cuvette Lot#: [id number].  Exp: 10/2010.    Anticoagulation Management Assessment/Plan:      The patient's current anticoagulation dose is Warfarin sodium 10 mg tabs: Take as directed by coumadin clinic..  The target INR is 2.0-3.0.  The next INR is due 11/11/2009.  Anticoagulation instructions were given to patient.  Results were reviewed/authorized by Cloyde Reams, RN, BSN.  He was notified by Cloyde Reams RN.         Prior Anticoagulation Instructions: INR 3.7  eat greens tonight 1/2 tab = 5mg  today Thur 3/17 then 1 tab = 10mg  each day  Current Anticoagulation  Instructions: INR 2.9  Continue on same dosage 10mg  daily.  Stop coumadin on 11/08/09 last dose then check INR on 11/11/09.

## 2010-08-13 NOTE — Medication Information (Signed)
Summary: rov/eac  Anticoagulant Therapy  Managed by: Weston Brass, PharmD Referring MD: Willa Rough MD PCP: Rayann Heman MD: Clifton James MD, Cristal Deer Indication 1: Aortic Valve Replacement (ICD-V43.3) Indication 2: Aortic Valve Disorder (ICD-424.1) Lab Used: [facility] Site: [facility] INR POC 2.2 INR RANGE 2 - 3  Dietary changes: no    Health status changes: no    Bleeding/hemorrhagic complications: no    Recent/future hospitalizations: yes       Details: had surgery last week ; restarted Coumadin on Friday 5/6  Any changes in medication regimen? no    Recent/future dental: no  Any missed doses?: yes     Details: off Coumadin for procedure last week  Is patient compliant with meds? yes       Allergies: 1)  ! Lopressor (Metoprolol Tartrate) 2)  Codeine Phosphate (Codeine Phosphate)  Anticoagulation Management History:      The patient is taking warfarin and comes in today for a routine follow up visit.  Positive risk factors for bleeding include an age of 65 years or older.  The bleeding index is 'intermediate risk'.  Positive CHADS2 values include History of HTN.  Negative CHADS2 values include Age > 21 years old.  The start date was 02/19/2004.  His last INR was 3.1.  Anticoagulation responsible provider: Clifton James MD, Cristal Deer.  INR POC: 2.2.  Cuvette Lot#: [id number].  Exp: 02/2011.    Anticoagulation Management Assessment/Plan:      The patient's current anticoagulation dose is Warfarin sodium 10 mg tabs: Take as directed by coumadin clinic..  The target INR is 2.0-3.0.  The next INR is due 12/15/2009.  Anticoagulation instructions were given to patient.  Results were reviewed/authorized by Weston Brass, PharmD.  He was notified by Weston Brass PharmD.         Prior Anticoagulation Instructions: INR 1.2  Take 1.5 tablets (15 mg) today and tomorrow.  Then return to normal  dosing schedule of 1 tablet (10 mg) daily.  Return to clinic in 1 week.    Current  Anticoagulation Instructions: INR 2.2  Continue same dose of 1 tablet every day

## 2010-08-13 NOTE — Medication Information (Signed)
Summary: rov/sp  Anticoagulant Therapy  Managed by: Bethena Midget, RN, BSN Referring MD: Willa Rough MD PCP: Rayann Heman MD: Shirlee Latch MD, Paxtyn Boyar Indication 1: Aortic Valve Replacement (ICD-V43.3) Indication 2: Aortic Valve Disorder (ICD-424.1) Lab Used: [facility]  Site: [facility] INR POC 2.4 INR RANGE 2 - 3  Dietary changes: no    Health status changes: yes       Details: Swelling Bil lower legs and feet, more in Rt.   Bleeding/hemorrhagic complications: no    Recent/future hospitalizations: no    Any changes in medication regimen? no    Recent/future dental: no  Any missed doses?: no       Is patient compliant with meds? yes       Allergies: 1)  ! Lopressor (Metoprolol Tartrate) 2)  Codeine Phosphate (Codeine Phosphate)  Anticoagulation Management History:      The patient is taking warfarin and comes in today for a routine follow up visit.  Positive risk factors for bleeding include an age of 17 years or older.  The bleeding index is 'intermediate risk'.  Positive CHADS2 values include History of HTN.  Negative CHADS2 values include Age > 23 years old.  The start date was 02/19/2004.  His last INR was 3.1.  Anticoagulation responsible provider: Shirlee Latch MD, Abner Ardis.  INR POC: 2.4.  Cuvette Lot#: [id number].  Exp: 03/2011.    Anticoagulation Management Assessment/Plan:      The patient's current anticoagulation dose is Warfarin sodium 10 mg tabs: Take as directed by coumadin clinic..  The target INR is 2.0-3.0.  The next INR is due 02/11/2010.  Anticoagulation instructions were given to patient.  Results were reviewed/authorized by Bethena Midget, RN, BSN.  He was notified by Bethena Midget, RN, BSN.         Prior Anticoagulation Instructions: INR 2.5  Continue same dose of 1 tablet every day   Current Anticoagulation Instructions: INR 2.4 Continue 10mg s daily. Recheck in 4 weeks.

## 2010-08-13 NOTE — Letter (Signed)
Summary: [facility]   Imported By: Maryln Gottron 12/24/2009 08:47:51  _____________________________________________________________________  External Attachment:    Type:   Image     Comment:   External Document

## 2010-08-13 NOTE — Assessment & Plan Note (Signed)
Summary: ec6/surgical clearance for/hernia/stop plavix & ASA 5 days/jml  Medications Added CRESTOR 20 MG TABS (ROSUVASTATIN CALCIUM) Take one tablet by mouth daily.      Allergies Added:   Visit Type:  surgical clearance Referring Provider:  Corliss Skains Primary Provider:  Amador Cunas   History of Present Illness: The patient is seen for surgical clearance for hernia surgery.  He is actually doing very well.  I have not seen him in many years.  He had aortic valve surgery in 2005.  This was done for aortic stenosis.  He had a dental bental procedure with a mechanical aortic prosthesis in an aortic conduit.  He did have CABG at that time.  He also developed AV block at that time and required a pacemaker.  His pacemaker works well and has been followed.  He has not had a followup echo since 2005.  He has no chest pain.  He does about full activity.  There've been no syncope.  He has a significant hernia that needs to be repaired.  Current Medications (verified): 1)  Altace 10 Mg Caps (Ramipril) .Marland Kitchen.. 1 Once Daily 2)  Aspirin Ec 81 Mg Tbec (Aspirin) .... Take 1 Tablet By Mouth Every Morning 3)  Colchicine 0.6 Mg Tabs (Colchicine) .Marland Kitchen.. 1 Two Times A Day 4)  Crestor 20 Mg Tabs (Rosuvastatin Calcium) .... Take One Tablet By Mouth Daily. 5)  Nitroquick 0.4 Mg Subl (Nitroglycerin) .... As Needed 6)  Metoprolol Succinate 100 Mg  Tb24 (Metoprolol Succinate) .... One Daily 7)  Warfarin Sodium 10 Mg Tabs (Warfarin Sodium) .... Take As Directed By Coumadin Clinic. 8)  Klor-Con M10 10 Meq  Tbcr (Potassium Chloride Crys Cr) .Marland Kitchen.. 1 Two Times A Day 9)  Amlodipine Besylate 5 Mg Tabs (Amlodipine Besylate) .... One Daily 10)  Viagra 50 Mg Tabs (Sildenafil Citrate) .... As Directed 11)  Furosemide 40 Mg Tabs (Furosemide) .... One Twice Daily 12)  Glucosamine-Chondroitin 250-200 Mg Caps (Glucosamine-Chondroitin) .... Take 1 Capsule Daily 13)  Vitamin C 500 Mg Tabs (Ascorbic Acid) .... Take 1 Tablet Daily 14)  Fish  Oil 1000 Mg Caps (Omega-3 Fatty Acids) .... Take 1 Capsule Daily 15)  Multivitamins   Tabs (Multiple Vitamin) .... Take 1 Tablet Daily 16)  Calcium 500 Mg Tabs (Calcium Carbonate) .... Take 1 Tablet Two Times A Day 17)  Bonine 25 Mg Chew (Meclizine Hcl) .... Take 1 Tablet Daily  Allergies (verified): 1)  ! Lopressor (Metoprolol Tartrate) 2)  Codeine Phosphate (Codeine Phosphate)  Past History:  Past Surgical History: Last updated: 11/01/2008 Coronary artery bypass graft August 2005 Aortic valve repair August 2005 Permanent pacemaker - Doylene Canning. Ladona Ridgel, M.D. - 02/25/2004 - complete heart block Circumcision Shoulder surgery status post PCI with stenting December 2004 colonoscopy  February 2008, upper panendoscopy, February 2008  Past Medical History: HYPERLIPIDEMIA (ICD-272.4) HYPERTENSION (ICD-401.9) CORONARY ARTERY DISEASE (ICD-414.00) CABG... 2005 Bental procedure... August,.... 2005...( aVR in aortic root conduit) Aortic stenosis... treated surgically PACEMAKER (ICD-V45..01).Marland KitchenMarland KitchenAugust, 2005... Dr. Ladona Ridgel Carotid artery disease minimal... Doppler... August 2 005 AV BLOCK, COMPLETE (ICD-426.0) HEMORRHOIDS, INTERNAL (ICD-455.0) ENCOUNTER FOR THERAPEUTIC DRUG MONITORING (ICD-V58.83) SPECIAL SCREENING MALIGNANT NEOPLASM OF PROSTATE (ICD-V76.44) DIVERTICULOSIS, COLON (ICD-562.10) GOUT (ICD-274.9)    Review of Systems       Patient denies fever, chills, headache, sweats, rash, change in vision, change in hearing, chest pain, cough, shortness of breath, nausea vomiting, urinary symptoms.  He is limited by his hernia.  All of the systems are reviewed and are negative.  Vital Signs:  Patient  profile:   65 year old male Height:      66 inches Weight:      241 pounds BMI:     39.04 Pulse rate:   62 / minute BP sitting:   142 / 72  (left arm) Cuff size:   regular  Vitals Entered By: Hardin Negus, RMA (August 28, 2009 11:52 AM)  Physical Exam  General:  patient is stable  in general. Head:  head is atraumatic. Eyes:  no xanthelasma. Neck:  arterial venous distention. Chest Wall:  no chest wall tenderness. Lungs:  lungs are clear.  Respiratory effort is nonlabored. Heart:  cardiac exam reveals S1 and S2.  Valve closure is crisp.  No AI is heard. Abdomen:  abdomen is soft. Msk:  no musculoskeletal deformities. Extremities:  no peripheral edema. Skin:  no skin rashes. Psych:  patient is oriented to person time and place.  Affect is normal.   PPM Specifications Following MD:  Lewayne Bunting, MD     PPM Vendor:  [employer]     PPM Model Number:  [id number]     PPM Serial Number:  [id number] PPM DOI:  02/25/2004      Lead 1    Location: [facility]     DOI: 02/25/2004     Model #: [id number]     Serial #: [id number]     Status: active Lead 2    Location: [facility]     DOI: 02/25/2004     Model #: [id number]     Serial #: [id number]      Magnet Response Rate:  BOL  98.6 ERI  86.3  Indications:  CHB  Explantation Comments:  TTM's with [employer]  PPM Follow Up Pacer Dependent:  Yes      Episodes Coumadin:  Yes  Parameters Mode:  DDDR     Lower Rate Limit:  60     Upper Rate Limit:  120 Paced AV Delay:  250     Sensed AV Delay:  225  Impression & Recommendations:  Problem # 1:  COUMADIN THERAPY (ICD-V58.61) The patient is on Coumadin for his mechanical prosthesis.  This will be carefully stopped with consideration of Lovenox coverage for his surgery.  Problem # 2:  AORTIC VALVE REPLACEMENT, HX OF (ICD-V43.3)  Orders: Echocardiogram (Echo) 2-D echo has been ordered to follow up his aortic valve prosthesis and to assess LV function.  Problem # 3:  LEG EDEMA (ICD-782.3) The patient is not having any edema.  Problem # 4:  HYPERTENSION (ICD-401.9)  His updated medication list for this problem includes:    Altace 10 Mg Caps (Ramipril) .Marland Kitchen... 1 once daily    Aspirin Ec 81 Mg Tbec (Aspirin) .Marland Kitchen... Take 1 tablet by mouth every morning    Metoprolol Succinate 100 Mg Tb24 (Metoprolol succinate) ..... One  daily    Amlodipine Besylate 5 Mg Tabs (Amlodipine besylate) ..... One daily    Furosemide 40 Mg Tabs (Furosemide) ..... One twice daily Blood pressure is under good control today.  No change in therapy.  Problem # 5:  CORONARY ARTERY DISEASE (ICD-414.00)  His updated medication list for this problem includes:    Altace 10 Mg Caps (Ramipril) .Marland Kitchen... 1 once daily    Aspirin Ec 81 Mg Tbec (Aspirin) .Marland Kitchen... Take 1 tablet by mouth every morning    Nitroquick 0.4 Mg Subl (Nitroglycerin) .Marland Kitchen... As needed    Metoprolol Succinate 100 Mg Tb24 (Metoprolol succinate) ..... One daily    Warfarin Sodium 10 Mg  Tabs (Warfarin sodium) .Marland Kitchen... Take as directed by coumadin clinic.    Amlodipine Besylate 5 Mg Tabs (Amlodipine besylate) ..... One daily  Orders: EKG w/ Interpretation (93000) Patient is not having any significant symptoms.  EKG is done and reviewed by me.  He has sinus rhythm with ventricular pacing.  The patient's exercise capacity is good.  Is not having symptoms.  He does not need any screening for his ischemic disease at this time.  Problem # 6:  * CLEARANCE FOR HERNIA SURGERY The patient's cardiac status is stable.  We will have his echo done and reviewed rapidly.  Unless there is an unexpected abnormality with his valve or left ventricular function, he will be cleared for surgery for his hernia repair. We will coordinate with the Coumadin clinic for the holding of his Coumadin probable Lovenox coverage for his surgery.  He will need to have his Coumadin started immediately after surgery.  Patient Instructions: 1)  Your physician has requested that you have an echocardiogram.  Echocardiography is a painless test that uses sound waves to create images of your heart. It provides your doctor with information about the size and shape of your heart and how well your heart's chambers and valves are working.  This procedure takes approximately one hour. There are no restrictions for this procedure. 2)  You  have been cleared for surgery, pending your echo results.  You will need to be off coumadin and on Lovenox for surgery, when you have scheduled the surgery please give [place] a call so we can stop your coumadin and start the Lovenox. 3)  Follow up in 1 year   Appended Document: ec6/surgical clearance for/hernia/stop plavix & ASA 5 days/jml Echocardiogram data reviewed.  Aortic prosthesis is working well.  Ejection fraction is 50-55%.  There is questionable wall motion abnormality, but this could be related to pacing function.  Overall the echo data is favorable.  Patient has no symptoms and has good exercise capacity.  He will be cleared for his hernia surgery.

## 2010-08-13 NOTE — Medication Information (Signed)
Summary: rov/ewj   Anticoagulant Therapy  Managed by: Weston Brass, PharmD Referring MD: Willa Rough MD PCP: Rayann Heman MD: Jens Som MD, Arlys John Indication 1: Aortic Valve Replacement (ICD-V43.3) Indication 2: Aortic Valve Disorder (ICD-424.1) Lab Used: [facility] Site: [facility] INR POC 2.4 INR RANGE 2 - 3  Dietary changes: no    Health status changes: no    Bleeding/hemorrhagic complications: no    Recent/future hospitalizations: no    Any changes in medication regimen? yes       Details: stopped amlodipine and colchicine  Recent/future dental: no  Any missed doses?: no       Is patient compliant with meds? yes       Allergies: 1)  ! Lopressor (Metoprolol Tartrate) 2)  Codeine Phosphate (Codeine Phosphate)  Anticoagulation Management History:      The patient is taking warfarin and comes in today for a routine follow up visit.  Positive risk factors for bleeding include an age of 59 years or older.  The bleeding index is 'intermediate risk'.  Positive CHADS2 values include History of HTN.  Negative CHADS2 values include Age > 69 years old.  The start date was 02/19/2004.  His last INR was 3.1.  Anticoagulation responsible provider: Jens Som MD, Arlys John.  INR POC: 2.4.  Cuvette Lot#: [id number].  Exp: 05/2011.    Anticoagulation Management Assessment/Plan:      The patient's current anticoagulation dose is Warfarin sodium 10 mg tabs: Take as directed by coumadin clinic..  The target INR is 2.0-3.0.  The next INR is due 05/07/2010.  Anticoagulation instructions were given to patient.  Results were reviewed/authorized by Weston Brass, PharmD.  He was notified by Kennieth Francois.         Prior Anticoagulation Instructions: INR 2.9  Continue on same dosage 10mg  daily.  Recheck in 4 weeks.    Current Anticoagulation Instructions: INR 2.4  Continue taking one tablet every day.  Recheck in four weeks.

## 2010-08-13 NOTE — Letter (Signed)
Summary: [name]'s Office Note  [name]'s Office Note   Imported By: Roderic Ovens 10/23/2009 16:23:44  _____________________________________________________________________  External Attachment:    Type:   Image     Comment:   External Document

## 2010-08-18 ENCOUNTER — Encounter: Payer: Self-pay | Admitting: Cardiology

## 2010-08-18 ENCOUNTER — Encounter (INDEPENDENT_AMBULATORY_CARE_PROVIDER_SITE_OTHER): Payer: MEDICARE

## 2010-08-18 DIAGNOSIS — Z7901 Long term (current) use of anticoagulants: Secondary | ICD-10-CM

## 2010-08-18 DIAGNOSIS — I359 Nonrheumatic aortic valve disorder, unspecified: Secondary | ICD-10-CM

## 2010-08-18 LAB — CONVERTED CEMR LAB: POC INR: 2.2

## 2010-08-19 NOTE — Cardiovascular Report (Signed)
Summary: TTM   TTM   Imported By: Roderic Ovens 08/11/2010 12:03:02  _____________________________________________________________________  External Attachment:    Type:   Image     Comment:   External Document

## 2010-08-25 DIAGNOSIS — I359 Nonrheumatic aortic valve disorder, unspecified: Secondary | ICD-10-CM

## 2010-08-25 DIAGNOSIS — I350 Nonrheumatic aortic (valve) stenosis: Secondary | ICD-10-CM

## 2010-08-25 DIAGNOSIS — Z954 Presence of other heart-valve replacement: Secondary | ICD-10-CM

## 2010-08-25 HISTORY — DX: Nonrheumatic aortic (valve) stenosis: I35.0

## 2010-08-27 NOTE — Medication Information (Signed)
Summary: [facility]   Anticoagulant Therapy  Managed by: Georgina Pillion, PharmD Referring MD: Willa Rough MD PCP: Rayann Heman MD: Elease Hashimoto, MD Indication 1: Aortic Valve Replacement (ICD-V43.3) Indication 2: Aortic Valve Disorder (ICD-424.1) Lab Used: [facility] Site: [facility] INR POC 2.2 INR RANGE 2 - 3  Dietary changes: no    Health status changes: no    Bleeding/hemorrhagic complications: no    Recent/future hospitalizations: no    Any changes in medication regimen? no    Recent/future dental: no  Any missed doses?: no       Is patient compliant with meds? yes       Allergies: 1)  ! Lopressor (Metoprolol Tartrate) 2)  Codeine Phosphate (Codeine Phosphate)  Anticoagulation Management History:      Positive risk factors for bleeding include an age of 67 years or older.  The bleeding index is 'intermediate risk'.  Positive CHADS2 values include History of HTN.  Negative CHADS2 values include Age > 83 years old.  The start date was 02/19/2004.  His last INR was 2.6.  Anticoagulation responsible provider: Nahser, MD.  INR POC: 2.2.  Exp: 08/2011.    Anticoagulation Management Assessment/Plan:      The patient's current anticoagulation dose is Warfarin sodium 10 mg tabs: Take as directed by coumadin clinic..  The target INR is 2.0-3.0.  The next INR is due 09/15/2010.  Anticoagulation instructions were given to patient.  Results were reviewed/authorized by Georgina Pillion, PharmD.  He was notified by Georgina Pillion PharmD.         Prior Anticoagulation Instructions: INR 3.0  Coumadin 10 mg tablets - Continue 1 tablet every day   Current Anticoagulation Instructions: Continue taking your coumadin as scheduled with 1 tablet (10 mg) daily.  INR 2.2

## 2010-09-02 ENCOUNTER — Telehealth: Payer: Self-pay | Admitting: Internal Medicine

## 2010-09-02 MED ORDER — FUROSEMIDE 40 MG PO TABS: 40.0000 mg | ORAL_TABLET | Freq: Two times a day (BID) | ORAL | Status: DC

## 2010-09-02 MED ORDER — POTASSIUM CHLORIDE 10 MEQ PO TBCR: 10.0000 meq | EXTENDED_RELEASE_TABLET | Freq: Two times a day (BID) | ORAL | Status: DC

## 2010-09-02 NOTE — Telephone Encounter (Signed)
Pts wife called to request that refills on meds: potassium / furosemide ... Be sent to [facility]...the patient has appt for cpx next month with Dr Kirtland Bouchard.

## 2010-09-15 ENCOUNTER — Encounter: Payer: Self-pay | Admitting: Cardiology

## 2010-09-15 ENCOUNTER — Encounter (INDEPENDENT_AMBULATORY_CARE_PROVIDER_SITE_OTHER): Payer: Medicare Other

## 2010-09-15 DIAGNOSIS — Z954 Presence of other heart-valve replacement: Secondary | ICD-10-CM

## 2010-09-15 DIAGNOSIS — I359 Nonrheumatic aortic valve disorder, unspecified: Secondary | ICD-10-CM

## 2010-09-15 DIAGNOSIS — Z7901 Long term (current) use of anticoagulants: Secondary | ICD-10-CM

## 2010-09-21 ENCOUNTER — Other Ambulatory Visit (INDEPENDENT_AMBULATORY_CARE_PROVIDER_SITE_OTHER): Payer: Medicare Other | Admitting: Internal Medicine

## 2010-09-21 DIAGNOSIS — R609 Edema, unspecified: Secondary | ICD-10-CM

## 2010-09-21 DIAGNOSIS — E785 Hyperlipidemia, unspecified: Secondary | ICD-10-CM

## 2010-09-21 DIAGNOSIS — Z79899 Other long term (current) drug therapy: Secondary | ICD-10-CM

## 2010-09-21 DIAGNOSIS — I10 Essential (primary) hypertension: Secondary | ICD-10-CM

## 2010-09-21 DIAGNOSIS — I2510 Atherosclerotic heart disease of native coronary artery without angina pectoris: Secondary | ICD-10-CM

## 2010-09-21 DIAGNOSIS — Z0000 Encounter for general adult medical examination without abnormal findings: Secondary | ICD-10-CM

## 2010-09-21 DIAGNOSIS — Z125 Encounter for screening for malignant neoplasm of prostate: Secondary | ICD-10-CM

## 2010-09-21 LAB — CBC WITH DIFFERENTIAL/PLATELET
Basophils Absolute: 0 10*3/uL (ref 0.0–0.1)
Basophils Relative: 0.3 % (ref 0.0–3.0)
Eosinophils Absolute: 0.1 10*3/uL (ref 0.0–0.7)
Lymphocytes Relative: 20.4 % (ref 12.0–46.0)
MCHC: 33.9 g/dL (ref 30.0–36.0)
Neutrophils Relative %: 70.1 % (ref 43.0–77.0)
Platelets: 150 10*3/uL (ref 150.0–400.0)
RBC: 4.78 Mil/uL (ref 4.22–5.81)

## 2010-09-21 LAB — HEPATIC FUNCTION PANEL
Alkaline Phosphatase: 44 U/L (ref 39–117)
Bilirubin, Direct: 0.1 mg/dL (ref 0.0–0.3)
Total Bilirubin: 0.6 mg/dL (ref 0.3–1.2)
Total Protein: 6.4 g/dL (ref 6.0–8.3)

## 2010-09-21 LAB — POCT URINALYSIS DIPSTICK
Leukocytes, UA: NEGATIVE
Nitrite, UA: NEGATIVE
Protein, UA: NEGATIVE
Urobilinogen, UA: 0.2
pH, UA: 6

## 2010-09-21 LAB — BASIC METABOLIC PANEL
CO2: 29 mEq/L (ref 19–32)
Calcium: 8.7 mg/dL (ref 8.4–10.5)
Creatinine, Ser: 1.1 mg/dL (ref 0.4–1.5)

## 2010-09-21 LAB — LIPID PANEL
HDL: 42.8 mg/dL (ref >39.00)
LDL Cholesterol: 81 mg/dL (ref 0–99)
Total CHOL/HDL Ratio: 4
VLDL: 29.2 mg/dL (ref 0.0–40.0)

## 2010-09-22 NOTE — Medication Information (Signed)
Summary: rov/ewj  Anticoagulant Therapy  Managed by: Cloyde Reams, RN, BSN Referring MD: Willa Rough MD PCP: Rayann Heman MD: Patty Sermons Indication 1: Aortic Valve Replacement (ICD-V43.3) Indication 2: Aortic Valve Disorder (ICD-424.1) Lab Used: [facility] Site: [facility] INR POC 2.5 INR RANGE 2 - 3  Dietary changes: no    Health status changes: no    Bleeding/hemorrhagic complications: no    Recent/future hospitalizations: no    Any changes in medication regimen? no    Recent/future dental: no  Any missed doses?: no       Is patient compliant with meds? yes       Allergies: 1)  ! Lopressor (Metoprolol Tartrate) 2)  Codeine Phosphate (Codeine Phosphate)  Anticoagulation Management History:      The patient is taking warfarin and comes in today for a routine follow up visit.  Positive risk factors for bleeding include an age of 67 years or older.  The bleeding index is 'intermediate risk'.  Positive CHADS2 values include History of HTN.  Negative CHADS2 values include Age > 27 years old.  The start date was 02/19/2004.  His last INR was 2.6.  Anticoagulation responsible provider: Kamilia Carollo.  INR POC: 2.5.  Cuvette Lot#: [id number].  Exp: 07/2011.    Anticoagulation Management Assessment/Plan:      The patient's current anticoagulation dose is Warfarin sodium 10 mg tabs: Take as directed by coumadin clinic..  The target INR is 2.0-3.0.  The next INR is due 10/13/2010.  Anticoagulation instructions were given to patient.  Results were reviewed/authorized by Cloyde Reams, RN, BSN.  He was notified by Cloyde Reams RN.         Prior Anticoagulation Instructions: Continue taking your coumadin as scheduled with 1 tablet (10 mg) daily.  INR 2.2  Current Anticoagulation Instructions: INR 2.5  Continue on same dosage 10mg  daily.  Recheck in 4 weeks.

## 2010-09-25 ENCOUNTER — Encounter: Payer: Self-pay | Admitting: Internal Medicine

## 2010-09-28 ENCOUNTER — Encounter: Payer: Self-pay | Admitting: Internal Medicine

## 2010-09-28 ENCOUNTER — Ambulatory Visit (INDEPENDENT_AMBULATORY_CARE_PROVIDER_SITE_OTHER): Payer: PRIVATE HEALTH INSURANCE | Admitting: Internal Medicine

## 2010-09-28 DIAGNOSIS — R609 Edema, unspecified: Secondary | ICD-10-CM

## 2010-09-28 DIAGNOSIS — E785 Hyperlipidemia, unspecified: Secondary | ICD-10-CM

## 2010-09-28 DIAGNOSIS — I10 Essential (primary) hypertension: Secondary | ICD-10-CM

## 2010-09-28 DIAGNOSIS — Z0000 Encounter for general adult medical examination without abnormal findings: Secondary | ICD-10-CM

## 2010-09-28 DIAGNOSIS — I359 Nonrheumatic aortic valve disorder, unspecified: Secondary | ICD-10-CM

## 2010-09-28 DIAGNOSIS — I2510 Atherosclerotic heart disease of native coronary artery without angina pectoris: Secondary | ICD-10-CM

## 2010-09-28 MED ORDER — ROSUVASTATIN CALCIUM 20 MG PO TABS: 20.0000 mg | ORAL_TABLET | Freq: Every day | ORAL | Status: DC

## 2010-09-28 MED ORDER — RAMIPRIL 10 MG PO CAPS: 10.0000 mg | ORAL_CAPSULE | Freq: Every day | ORAL | Status: DC

## 2010-09-28 MED ORDER — FUROSEMIDE 40 MG PO TABS: 40.0000 mg | ORAL_TABLET | Freq: Two times a day (BID) | ORAL | Status: DC

## 2010-09-28 MED ORDER — METOPROLOL SUCCINATE ER 100 MG PO TB24: 100.0000 mg | ORAL_TABLET | Freq: Every day | ORAL | Status: DC

## 2010-09-28 MED ORDER — POTASSIUM CHLORIDE 10 MEQ PO TBCR: 10.0000 meq | EXTENDED_RELEASE_TABLET | Freq: Two times a day (BID) | ORAL | Status: DC

## 2010-09-28 NOTE — Patient Instructions (Signed)
Limit your sodium (Salt) intake  You need to lose weight.  Consider a lower calorie diet and regular exercise.  Return in 6 months for follow-up    It is important that you exercise regularly, at least 20 minutes 3 to 4 times per week.  If you develop chest pain or shortness of breath seek  medical attention.

## 2010-09-28 NOTE — Progress Notes (Signed)
Subjective:    Patient ID: [name], male    DOB: February 14, 1946, 65 y.o.   MRN: [id number]  HPI  65 year old patient who is followed closely by cardiology for coronary artery disease he is also status post aortic valve repair and permanent pacemaker insertion for complete heart block. Laboratory studies were reviewed and and fasting blood sugar was up to 131. The significance of impaired glucose tolerance discussed at length. A brother has type 2 diabetes. He is seen today for an annual physical and except for right knee pain doing well. His weight is 250.  1. Risk factors, based on past  M,S,F history-  Patient has known coronary artery disease. Cardiovascular restrictions include hypertension dyslipidemia family history and impaired glucose tolerance  2.  Physical activities: fairly sedentary Limited somewhat by right knee pain  3.  Depression/mood: no history of depression or mood disorder  4.  Hearing: no hearing deficits  5.  ADL's: independent in all aspects of daily living  6.  Fall risk: low  7.  Home safety: no problems identified  8.  Height weight, and visual acuity; height and weight stable no change in visual acuity. Has had a recent eye exam  9.  Counseling: heart healthy diet weight loss exercise all encouraged  10. Lab orders based on risk factors: laboratory profile reviewed  11. Referral-  followup cardiology as scheduled 12. Care plan: lifestyle issues discussed at length weight loss exercise all encouraged  13. Cognitive assessment:  Alert and oriented normal affect no history of cognitive dysfunction   Past Medical History  Diagnosis Date  . Aortic valve disease 08/25/2010  . AORTIC VALVE REPLACEMENT, HX OF 05/23/2009  . AV BLOCK, COMPLETE 11/01/2008  . CORONARY ARTERY DISEASE 01/16/2007  . DIVERTICULOSIS, COLON 10/17/2007  . DIZZINESS 06/19/2009  . GOUT 01/16/2007  . HEMORRHOIDS, INTERNAL 11/01/2008  . HYPERLIPIDEMIA 01/16/2007  . HYPERTENSION 01/16/2007  .  LEG EDEMA 11/27/2008  . PACEMAKER, PERMANENT 11/01/2008  . CAD (coronary artery disease)   . Impaired glucose tolerance    Past Surgical History  Procedure Date  . Coronary artery bypass graft   . Aortic valve replacement   . Pacemaker insertion   . Shoulder surgery     reports that he quit smoking about 37 years ago. He does not have any smokeless tobacco history on file. His alcohol and drug histories not on file. family history is not on file. Allergies  Allergen Reactions  . Codeine Phosphate     REACTION: difficulty breathing  . Metoprolol Tartrate     REACTION: loss of vision         Review of Systems  Constitutional: Negative for fever, chills, activity change, appetite change and fatigue.  HENT: Negative for hearing loss, ear pain, congestion, rhinorrhea, sneezing, mouth sores, trouble swallowing, neck pain, neck stiffness, dental problem, voice change, sinus pressure and tinnitus.   Eyes: Negative for photophobia, pain, redness and visual disturbance.  Respiratory: Negative for apnea, cough, choking, chest tightness, shortness of breath and wheezing.   Cardiovascular: Positive for leg swelling. Negative for chest pain and palpitations.  Gastrointestinal: Negative for nausea, vomiting, abdominal pain, diarrhea, constipation, blood in stool, abdominal distention, anal bleeding and rectal pain.  Genitourinary: Negative for dysuria, urgency, frequency, hematuria, flank pain, decreased urine volume, discharge, penile swelling, scrotal swelling, difficulty urinating, genital sores and testicular pain.  Musculoskeletal: Positive for joint swelling. Negative for myalgias, back pain, arthralgias and gait problem.  Skin: Negative for color change, rash and  wound.  Neurological: Negative for dizziness, tremors, seizures, syncope, facial asymmetry, speech difficulty, weakness, light-headedness, numbness and headaches.  Hematological: Negative for adenopathy. Does not bruise/bleed  easily.  Psychiatric/Behavioral: Negative for suicidal ideas, hallucinations, behavioral problems, confusion, sleep disturbance, self-injury, dysphoric mood, decreased concentration and agitation. The patient is not nervous/anxious.        Objective:   Physical Exam  Constitutional: He appears well-developed and well-nourished. No distress.        Weight 250. Blood pressure 140/80 both arms  HENT:  Head: Normocephalic and atraumatic.  Right Ear: External ear normal.  Left Ear: External ear normal.  Nose: Nose normal.  Mouth/Throat: Oropharynx is clear and moist.  Eyes: Conjunctivae and EOM are normal. Pupils are equal, round, and reactive to light. No scleral icterus.  Neck: Normal range of motion. Neck supple. No JVD present. No thyromegaly present.  Cardiovascular: Regular rhythm, normal heart sounds and intact distal pulses.  Exam reveals no gallop and no friction rub.   No murmur heard. Pulmonary/Chest: Effort normal and breath sounds normal. He exhibits no tenderness.  Abdominal: Soft. Bowel sounds are normal. He exhibits no distension and no mass. There is no tenderness.  Genitourinary: Prostate normal and penis normal.        Prostate slightly irregular with more prominent left lobe  Musculoskeletal: Normal range of motion. He exhibits edema. He exhibits no tenderness.        +2 lower extremity edema  Right knee slightly swollen tender and warm to touch  Lymphadenopathy:    He has no cervical adenopathy.  Neurological: He is alert. He has normal reflexes. No cranial nerve deficit. Coordination normal.  Skin: Skin is warm and dry. No rash noted.  Psychiatric: He has a normal mood and affect. His behavior is normal.          Assessment & Plan:   health maintenance examination  Impaired because tolerance- lifestyle issues discussed at length  Coronary artery disease  Dyslipidemia

## 2010-09-29 LAB — BASIC METABOLIC PANEL
BUN: 15 mg/dL (ref 6–23)
CO2: 32 mEq/L (ref 19–32)
Chloride: 104 mEq/L (ref 96–112)
Creatinine, Ser: 1.24 mg/dL (ref 0.4–1.5)
Potassium: 4.5 mEq/L (ref 3.5–5.1)

## 2010-09-29 LAB — DIFFERENTIAL
Basophils Relative: 0 % (ref 0–1)
Eosinophils Absolute: 0.1 10*3/uL (ref 0.0–0.7)
Eosinophils Relative: 1 % (ref 0–5)
Lymphs Abs: 2 10*3/uL (ref 0.7–4.0)
Monocytes Relative: 8 % (ref 3–12)
Neutrophils Relative %: 66 % (ref 43–77)

## 2010-09-29 LAB — PROTIME-INR
INR: 2.04 — ABNORMAL HIGH (ref 0.00–1.49)
Prothrombin Time: 22.9 seconds — ABNORMAL HIGH (ref 11.6–15.2)

## 2010-09-29 LAB — CBC
HCT: 43.8 % (ref 39.0–52.0)
MCHC: 34.5 g/dL (ref 30.0–36.0)
MCV: 88 fL (ref 78.0–100.0)
Platelets: 160 10*3/uL (ref 150–400)

## 2010-10-13 ENCOUNTER — Encounter: Payer: Medicare Other | Admitting: *Deleted

## 2010-10-26 ENCOUNTER — Encounter: Payer: Self-pay | Admitting: Internal Medicine

## 2010-10-26 DIAGNOSIS — I442 Atrioventricular block, complete: Secondary | ICD-10-CM

## 2010-10-28 ENCOUNTER — Ambulatory Visit (INDEPENDENT_AMBULATORY_CARE_PROVIDER_SITE_OTHER): Payer: PRIVATE HEALTH INSURANCE | Admitting: *Deleted

## 2010-10-28 DIAGNOSIS — I359 Nonrheumatic aortic valve disorder, unspecified: Secondary | ICD-10-CM

## 2010-10-28 DIAGNOSIS — Z954 Presence of other heart-valve replacement: Secondary | ICD-10-CM

## 2010-10-28 LAB — POCT INR: INR: 2.3

## 2010-11-09 ENCOUNTER — Other Ambulatory Visit: Payer: Self-pay | Admitting: *Deleted

## 2010-11-09 MED ORDER — WARFARIN SODIUM 10 MG PO TABS: 10.0000 mg | ORAL_TABLET | ORAL | Status: DC

## 2010-11-24 NOTE — Assessment & Plan Note (Signed)
[name]                         ELECTROPHYSIOLOGY OFFICE NOTE   [name], [name]                      MRN:          [id number]  DATE:03/12/2008                            DOB:          07-20-1945    [name] returns today for followup.  He is a very pleasant middle-  aged male with complete heart block, status post pacemaker insertion  back in August 2005.  He returns today for followup.  The patient also  has an aortic valve replacement with a [employer] Jude Hemashield valve conduit  with a Bentall procedure done at that time and two-vessel bypass  surgery.  Previously, he has preserved LV function.  He denies chest  pain.  He denies shortness of breath.  He has had no significant  palpitations.   CURRENT MEDICINES:  1. Colchicine 0.6 b.i.d. as needed.  2. Potassium 20 daily.  3. Altace 5 a day.  4. Zocor 40 a day.  5. Warfarin 10 a day.  6. Boniva 25 a day.  7. Multiple vitamins.  8. Aspirin 81 a day.  9. Metoprolol 100 mg daily.  10.Also, hydrochlorothiazide 25 a day.   PHYSICAL EXAMINATION:  GENERAL:  He is a pleasant well-appearing middle-  aged man in no distress.  VITAL SIGNS:  Blood pressure was 174/96.  The pulse was 59 and regular,  respirations were 18, and the weight was 219 pounds.  NECK:  No jugular venous distention.  LUNGS:  Clear bilaterally to auscultation.  No wheezes, rales, or  rhonchi are present.  CARDIOVASCULAR:  Regular rate and rhythm.  Normal S1 and S2.  EXTREMITIES:  Demonstrated no edema.   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  P-  waves are 2.  There are no R-waves secondary to complete heart block.  The impedance was 535 in the A and 514 in the V.  Thresholds of 1 volt  at 0.5 both in the right atrium and 0.5 volt at 0.5 in the right  ventricle.  Battery voltage was 2.8 volts.   IMPRESSION:  1. Complete heart block, status post pacemaker insertion.  2. Coronary artery  disease, status post bypass surgery.  3. Aortic valve disease, status post replacement with Bentall      procedure.   DISCUSSION:  [name] was stable and his pacemaker is working  normally.  We will plan to see him back in the office in 1 year for  pacemaker followup.     Doylene Canning. Ladona Ridgel, MD  Electronically Signed    GWT/MedQ  DD: 03/12/2008  DT: 03/13/2008  Job #: [id number]

## 2010-11-25 ENCOUNTER — Ambulatory Visit (INDEPENDENT_AMBULATORY_CARE_PROVIDER_SITE_OTHER): Payer: PRIVATE HEALTH INSURANCE | Admitting: *Deleted

## 2010-11-25 DIAGNOSIS — I359 Nonrheumatic aortic valve disorder, unspecified: Secondary | ICD-10-CM

## 2010-11-25 DIAGNOSIS — Z954 Presence of other heart-valve replacement: Secondary | ICD-10-CM

## 2010-11-25 LAB — POCT INR: INR: 2.1

## 2010-11-27 NOTE — Consult Note (Signed)
NAME:  [name], [name]                         ACCOUNT NO.:  [id number]   MEDICAL RECORD NO.:  [id number]                   PATIENT TYPE:  OIB   LOCATION:  6525                                 FACILITY:  [facility]   PHYSICIAN:  Salvatore Decent. Cornelius Moras, M.D.              DATE OF BIRTH:  03-25-1946   DATE OF CONSULTATION:  02/13/2004  DATE OF DISCHARGE:                                   CONSULTATION   CONSULTING PHYSICIAN:  Salvatore Decent. Cornelius Moras, M.D.   REQUESTING PHYSICIAN:  Willa Rough, M.D.   PRIMARY CARE PHYSICIAN:  Gordy Savers, M.D. [facility]   REASON FOR CONSULTATION:  Aortic stenosis, coronary artery disease, class II  exertional angina.   HISTORY OF PRESENT ILLNESS:  Mr. 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  He has a history of hypertension, gout, and hyperlipidemia.  His cardiac history dates back to December 2004, when he originally  presented to Dr. Amador Cunas with symptoms of exertional chest pain.  He  subsequently underwent a stress Cardiolite exam that was abnormal prompting  cardiac catheterization.  He was noted to have mild aortic stenosis at the  time with two-vessel coronary artery disease and high grade stenosis of the  circumflex marginal branch.  He underwent percutaneous coronary intervention  and stent placement with a CYPHER stent in the circumflex marginal coronary  artery.  His symptoms improved.  Mr. Tietje continued to do well until the  last couple of months or so when he has now developed recurrent __________  exertional substernal chest pain.  He describes classical symptoms of angina  which only are brought on with relatively strenuous exertion.  This occurs  when he is mowing the lawn or lifting something heavy at work.  His pain is  always relieved by rest.  It is associated with shortness of breath.  He  also has mild exertional shortness of breath.  He denies any  episodes of  chest pain occurring at rest or awakening him from sleep at night.  He  denies any resting shortness of breath.  He denies any palpitations or  syncopal episodes.  He denies any history of lower edema, PND, orthopnea.  Mr. Kubisiak recently underwent followup stress Cardiolite exam which was  evaluated by Dr. Myrtis Ser on February 07, 2004.  The patient exercised nine minutes  on a standard Bruce protocol, and he was noted to develop severe chest pain  consistent with angina.  He had marked EKG changes with 2-mm of ST  depression in the inferior and lateral leads.  Cardiolite images also  demonstrated evidence suggestive of ischemia in the inferior apical wall  with complete reversibility.  Mr. Beavers was subsequently brought back in  for followup cardiac catheterization and a 2D echocardiogram.  Catheterization was initially performed, on February 10, 2004, by Dr. Riley Kill,  and the patient  was found to have progression of coronary artery disease and  aortic stenosis.  Left ventricular function was preserved.  The 2D  echocardiogram was also performed confirming the persistence of normal left  ventricular size and function but progression of mild to moderate aortic  stenosis with peak and mean transvalvular gradient increased to 42 and 25-  mmHg respectively in comparison with echocardiogram performed last December  where corresponding measurements were 34 and 19-mmHg respectively.  This  corresponds with an estimated aortic valve area of approximately 1.5-cm2.  The patient is also noted to have a bicuspid aortic valve on echocardiogram.  The patient was taken back to the cath lab yesterday by Dr. Riley Kill with  consideration of possible percutaneous coronary intervention.  However after  repeat coronary arteriography, the decision was made to refer the patient  for a surgical intervention and a surgical consultation has been requested.   REVIEW OF SYSTEMS:  GENERAL:  Mr. Rogerson reports  feeling well.  He has a  good appetite.  He did loose weight last year while he participated in a  diet with his wife but this was entirely intentional, and he has been quite  stable recently.  CARDIAC:  Notable for symptoms of exertional angina as  described.  The patient does not have significant symptoms of congestive  heart failure other than mild exertional shortness of breath.  RESPIRATORY:  Notable for a recent dry nonproductive cough.  The patient denies a  productive cough, hemoptysis, wheezing.  GASTROINTESTINAL:  Negative.  The  patient reports normal bowel function.  He has no difficulty swallowing.  He  denies a history of hematochezia, hematemesis, melena.  MUSCULOSKELETAL:  Notable for mild arthritis and arthralgias afflicting both elbows, both  shoulders, both knees, and his lower back.  This is not terribly limiting.  He has not had any flares of gout recently.  NEUROLOGIC:  Negative.  The  patient denies symptoms suggestive of previous TIA or stroke.  ENDOCRINE:  Negative.  The patient denies symptoms suggestive of diabetes; however, his  blood sugars have been elevated this hospital admission.  PSYCHIATRIC:  Negative.  The patient denies recent problems with depression or anxiety.  HEENT:  Negative. The patient denies loose teeth or painful teeth.  He sees  his dentist regularly.  He has now been counseled regarding dental  prophylaxis.  GENITOURINARY:  Negative.   PAST MEDICAL HISTORY:  1. Aortic stenosis.  2. Coronary artery disease, status post percutaneous coronary intervention     of the circumflex marginal branch in December 2004.  3. Hypertension.  4. Hyperlipidemia.  5. Gout.   PAST SURGICAL HISTORY:  Patient has undergone shoulder surgery on both  shoulders in the past, following injuries.   FAMILY HISTORY:  Noncontributory.   SOCIAL HISTORY:  The patient is married and lives with his wife in [place].  They have been from [place] their entire  lives.  They have  three children and several grandchildren.  They remain quite active  physically and he works for [employer] which requires a fair amount  of strenuous labor.  He is a nonsmoker, although he did smoke a long time  having quit 35 years previously.  He denies history of excessive alcohol  consumption.   MEDICATIONS PRIOR TO ADMISSION:  Aspirin, Plavix, Lipitor, InnoPran,  Norvasc, Maxzide, Colchicine, Combivent inhaler.   DRUG ALLERGIES:  None known.   PHYSICAL EXAMINATION:  GENERAL:  Notable for a well-appearing male who  appears his stated age  in no acute distress.  VITAL SIGNS:  Blood pressure is currently 158/99, pulse 59 and regular.  He  is afebrile.  Oxygen saturation is 96% on room air.  HEENT:  Grossly unrevealing.  He has good dentition.  NECK:  Supple.  There is no cervical nor supraclavicular lymphadenopathy.  There is no jugular venous distention.  No carotid bruits are noted.  CHEST:  Auscultation of the chest demonstrates clear and symmetric breath  sounds bilaterally.  No wheezes or rhonchi are demonstrated.  CARDIOVASCULAR:  Notable for a regular rate and rhythm.  There is a grade  3/6 systolic murmur heard best along the sternal border with radiation  towards the neck.  No diastolic murmurs are noted.  ABDOMEN:  Slightly obese but soft and nontender.  There are no palpable  masses.  Bowel sounds are present.  The liver edge is not palpable.  EXTREMITIES:  Warm and well perfused.  There is no lower extremity edema.  Distal pulses are trace palpable in the dorsalis pedis position bilaterally.  There is no venous insufficiency.  There is no lower extremity edema.  RECTAL:  Deferred.  GU:  Deferred.  SKIN:  Clean and dry, healthy-appearing throughout.   DIAGNOSTIC TESTS:  1. A 2D echocardiogram, performed August 2, has been reviewed.  This     demonstrates normal left ventricle function with mild left ventricular     hypertrophy.  There is  bicuspid aortic valve with moderate aortic     stenosis with peak and mean transvalvular gradient of 42 and 25-mmHg     respectively.  Based on peak velocity this corresponds to an aortic valve     area of approximately 1.5-cm2.  No other abnormalities are appreciated.  2. Cardiac catheterizations, performed August 1 and August 3, are both     reviewed as was the previous catheterization from December 2004.  These     findings demonstrate left main coronary stenosis with at least 50%     stenosis of the distal left main coronary artery.  There is a long     segment 60% stenosis of the proximal left anterior descending coronary     artery and 80% stenosis of the distal left anterior descending coronary     artery just after takeoff of a diagonal branch.  There is at least 50-60%     in-stent restenosis of the large circumflex marginal branch at the site     of the previous percutaneous coronary intervention.  There is right    dominant coronary circulation with 20-30% stenosis in the right coronary     artery proximally.  No other significant abnormalities are noted.  By     catheterization, the mean aortic transvalvular gradient is 23-mmHg.     Right heart catheterization was not performed such that aortic valve area     has not been estimated by catheterization.   IMPRESSION:  Left main coronary stenosis and two vessel coronary artery  disease with moderate aortic stenosis that has progressed somewhat in  comparison with previous evaluation in December 2004.  The patient presents  with recurrent symptoms of exertional angina.  I believe that he would best  be treated by elective aortic valve replacement and coronary artery bypass  grafting.   PLAN:  I have outlined options at length with Mr. Broadus and his wife.  Alternative treatment strategies have been discussed in detail.  They  understand and accept all associated risks of surgery including but not  limited to risk of death,  stroke, myocardial infarction, congestive heart  failure, respiratory failure, pneumonia, arrythmia, heart block or  bradycardia requiring permanent pacemaker, recurrent coronary artery  disease, possible complications related to prosthetic aortic valve.  We have  discussed the relative risks and benefits of aortic valve replacement using  a mechanical valve with subsequent need for long term anticoagulation versus  the use of a bioprosthetic tissue valve and the possible risk of late valve  degeneration and failure.  After considerable discussion, Mr. Whetsel  requests that we proceed with mechanical valve replacement.  He seems like a  good candidate for long term anticoagulation therapy with Coumadin and I  support this decision.  All of their questions have been addressed.                                               Salvatore Decent. Cornelius Moras, M.D.    CHO/MEDQ  D:  02/13/2004  T:  02/14/2004  Job:  [id number]   cc:   Willa Rough, M.D.   Arturo Morton. Riley Kill, M.D. [facility]   Gordy Savers, M.D. [facility]   patient's chart in short stay

## 2010-11-27 NOTE — Assessment & Plan Note (Signed)
[facility]                            [facility] OFFICE NOTE   NAME:[name]                      MRN:          [id number]  DATE:06/21/2006                            DOB:          08-09-1945    A 65 year old gentleman seen today for an annual exam.  He has a history  of coronary artery disease status post CABG, aortic valve repair, and  pacemaker insertion in 2004.  He is status post aortic valve replacement  with St. Jude valve via Bentall procedure, as well as a 2 vessel graft  with LIMA to LAD and vein graft to circumflex marginal branch.  Post-op  he developed high-grade AV block and has a DDD pacemaker implant.  Clinically, is doing quite well.  He has hypertension, hyperlipidemia,  and a history of gout.   SURGICAL PROCEDURES:  Have included a shoulder surgery and a  circumcision.   FAMILY HISTORY:  Reviewed, unchanged.   He did have a screening sigmoidoscopy 5 or 6 years ago.   EXAM:  Well-developed, robust-appearing male in no acute distress.  Blood pressure 140/80.  FUNDI, EARS, NOSE, AND THROAT:  Clear.  NECK:  No bruits.  CHEST:  Clear.  CARDIOVASCULAR:  A grade 2/6 systolic murmur at the base.  Prosthetic  heart sounds noted.  ABDOMEN:  Soft and nontender.  No organomegaly.  GENITALIA:  Normal.  A small right inguinal hernia noted.  EXTREMITIES:  Full peripheral pulses.  No edema.  Did have a left  patellar bursitis with swelling.  RECTAL:  Prostate was minimally enlarged.  Stool was Hematest positive.   IMPRESSION:  1. Heme-positive stool.  2. Hypertension.  3. Coronary artery disease status post aortic valve repair.  4. Chronic Coumadin.   DISPOSITION:  The situation was discussed at length.  Will consider  diagnostic colonoscopy.  Will need brisk therapy with heparin.  Altace  will be discontinued and benazepril substituted.  Toprol dose increased  to 25 mg daily.     Gordy Savers, MD  Electronically  Signed    PFK/MedQ  DD: 06/21/2006  DT: 06/21/2006  Job #: [id number]

## 2010-11-27 NOTE — Cardiovascular Report (Signed)
NAME:  LELDON, STEEGE                         ACCOUNT NO.:  [id number]   MEDICAL RECORD NO.:  [id number]                   PATIENT TYPE:  OIB   LOCATION:  6525                                 FACILITY:  [facility]   PHYSICIAN:  Arturo Morton. Riley Kill, M.D. [facility]         DATE OF BIRTH:  Nov 27, 1945   DATE OF PROCEDURE:  02/12/2004  DATE OF DISCHARGE:                              CARDIAC CATHETERIZATION   INDICATIONS FOR PROCEDURE:  Mr. Schulke is a very delightful 65 year old  gentleman who has had previously identified aortic stenosis.  The aortic  stenosis has been thought to be of moderate degree.  He underwent  catheterization in December by Dr. Samule Ohm and had a stent placed to his  obtuse marginal branch.  Following this, the patient did well but has had  exertional chest tightness.  He has also had an abnormal Cardiolite with a  redistribution defect, chest pain, and ST depression.  His initial  catheterization suggested a valve area of about 1.6 cm squared with a  gradient of about 13 mm.  He underwent repeat cardiac catheterization two  days earlier at which time he was noted to have some slight haziness in  front of the previously placed stent but without high grade narrowing.  There appeared to be a high grade lesion in the mid left anterior descending  artery as well as segmental diffuse disease throughout the proximal LAD.  We  had a repeat 2D echo done which did, in fact, suggest a valve area in the  range of 1.5 cm square, although there is fairly very little valve motion.  His overall LV function is well preserved.  We discussed the various options  and elected to bring him back to the catheterization lab today for further  evaluation and probable percutaneous intervention.   PROCEDURE:  Selective coronary arteriography of the left coronary artery.   DESCRIPTION OF PROCEDURE:  The patient was brought to the catheterization  laboratory and prepped and draped in the usual fashion.   Through an anterior  puncture, the left femoral artery was easily entered.  Bivalirudin was given  according to protocol.  A 7 French sheath was then placed in the left  femoral artery.  We used a 6 [place] guiding catheter and we took multiple  views of the left anterior descending  artery.  With this, I subsequently  called Dr. Myrtis Ser to the catheterization laboratory to review the findings  with me.  To briefly summarize, the LAD is a heavily calcified vessel.  In  careful analysis with good angiographic injections, the mean lumen diameter  appears to be in the range of about 1.7 mm square throughout the whole  proximal vessel.  The high grade stenosis is about 80% in the mid vessel  after the take off of the third diagonal.  Based on this and the diffuseness  of the disease and also the fact that the patient has left main  disease  involving the distal left main, we thought that the strategy of spot  stenting the mid left anterior descending  artery, which was our original  plan, would likely not be the best long term strategy.  We discussed this  with the patient who was agreeable to our plans and I subsequently brought  his wife into the laboratory to review the angiographic studies in detail.  We notified Dr. Rexanne Mano who is on call for surgery to see the patient  with regards to aortic valve replacement and coronary artery bypass.   ANGIOGRAPHIC DATA:  The left main coronary artery is fairly small in caliber  and there is a calcified shelf on the superior aspect with a minimal lumen  diameter in the range of about 2.5.  The distal left main demonstrates about  40% to perhaps 50% narrowing with a probable MLD in the range of just over  2.  The proximal LAD is calcified.  The proximal LAD after the septal  perforator is heavily calcified and diffusely diseased with an MLD that  appears to be in the range of about 1.5 to 1.7.  Beyond this, the vessel  opens up to what appears to be  about a 2 mm artery and then there is a high  grade stenosis of about 80% after the third diagonal branch.  Distal to  this, the vessel is diffusely diseased, but the MLD approaches about 2 to  2.5 mm in the distal vessel.  The first diagonal was a very small caliber  vessel diffusely diseased with about 80% narrowing.  The third diagonal also  has about 50% narrowing.  The circumflex has about 50% narrowing prior to  the stent site but does not appear as hazy as on the original study.  The  superior sub-branch also has about 50% narrowing.  The AV circumflex has  probably somewhere in the neighborhood of about 40% narrowing and this  applies to the posterolateral branch.  The right coronary artery was studied  on the previous angiographic study.   CONCLUSION:  1. Severe disease of the left anterior descending artery as described in the     above text.  2. Mild to moderate disease of the distal left main.  3. Other findings as noted above.  4. Known aortic valve stenosis of moderate severity.   DISPOSITION:  Dr. Myrtis Ser and I reviewed this in careful detail.  On re-review  after getting good guiding shots, it is my opinion that the patient would be  best served by revascularization surgery with a mammary to the LAD.  The  complicating features is his aortic valve.  His aortic valve is a bicuspid  aortic valve, there is very little valve motion noted angiographically.  By  echo, only modest valve motion is noted.  The gradient by me earlier in the  week was about 23 mm.  Dr. Laneta Simmers will see the patient.  The patient's  Plavix will be held.                                               Arturo Morton. Riley Kill, M.D. [facility]    TDS/MEDQ  D:  02/12/2004  T:  02/13/2004  Job:  [id number]   cc:   Willa Rough, M.D.

## 2010-11-27 NOTE — Discharge Summary (Signed)
NAME:  [name], [name]                         ACCOUNT NO.:  [id number]   MEDICAL RECORD NO.:  [id number]                   PATIENT TYPE:  OIB   LOCATION:  6522                                 FACILITY:  [facility]   PHYSICIAN:  Duke Salvia, M.D.               DATE OF BIRTH:  August 31, 1945   DATE OF ADMISSION:  06/28/2003  DATE OF DISCHARGE:  06/29/2003                                 DISCHARGE SUMMARY   DISCHARGE DIAGNOSES:  1. Coronary artery disease, status post drug eluding stent to the obtuse     marginal #1 on June 28, 2003, by Dr. Randa Evens.  2. Hypertension, treated.  3. Hyperlipidemia, treated.  4. CODEINE allergy.  5. Former smoker.   HOSPITAL COURSE:  [name] is a 65 year old male patient who had  exertional chest pain and underwent a treadmill test under the care of Dr.  Amador Cunas.  He was concerned about the patient's chest pain and ST segment  changes and referred him for evaluation.  The patient did have a soft murmur  on exam, and the 2-D echocardiogram did reveal a good LV with a dilated  aortic root, and significant AS.  The patient was brought in for cardiac  catheterization, and he was found to have an EF of 60% with no wall motion  abnormalities, no MR, aortic valve area of 1.65 sq/cm.  He had a 30% mid  right lesion, a 20% distal left main stenosis, 50% LAD stenosis.  There was  a 90% in OM1 that was reduced to 0% after utilization of a Taxus stent.  At  this point, we will be more aggressive with the patient's lipid lowering  therapy.  He needs to remain on Plavix for six months and aspirin  indefinitely.  He remained in the hospital overnight, and was in stable  condition with the following lab work:  Sodium 146, potassium 4.6, BUN 12,  creatinine 1.  Hemoglobin 14.4, hematocrit 42.1, platelets 198.  He was  discharged to home on the following medications:   DISCHARGE MEDICATIONS:  1. Plavix 75 mg a day for six months.  2. Aspirin 325 mg a  day.  3. Lipitor 40 mg at bedtime.  4. Norvasc 5 mg a day.  5. Maxzide 25 mg a day.  6. Colchicine 0.6 mg twice a day.  7. InnoPran XL 80 mg a day.  8. May continue his vitamins.  9. Sublingual nitroglycerin as needed.   ACTIVITY:  No lifting, driving, or heavy work for two days, then gradually  increase activity.   DIET:  Remain on a low fat, low salt, low cholesterol diet.   WOUND CARE:  Clean the cath site with soap and water.  No scrubbing.   FOLLOWUP:  Follow up with Dr. Myrtis Ser on July 15, 2003, at 4:15 p.m.      Guy Franco, P.A. [facility]  Duke Salvia, M.D.    LB/MEDQ  D:  06/29/2003  T:  06/30/2003  Job:  [id number]

## 2010-11-27 NOTE — Op Note (Signed)
NAME:  [name], [name]                         ACCOUNT NO.:  [id number]   MEDICAL RECORD NO.:  [id number]                   PATIENT TYPE:  OIB   LOCATION:  2306                                 FACILITY:  [facility]   PHYSICIAN:  Salvatore Decent. Cornelius Moras, M.D.              DATE OF BIRTH:  June 01, 1946   DATE OF PROCEDURE:  02/18/2004  DATE OF DISCHARGE:                                 OPERATIVE REPORT   PREOPERATIVE DIAGNOSES:  Aortic stenosis, two-vessel coronary artery  disease, Class II exertional angina.   POSTOPERATIVE DIAGNOSES:  Aortic stenosis, two-vessel coronary artery  disease, Class II exertional angina, with aneurysmal dilatation of the  proximal ascending thoracic aorta.   PROCEDURE:  Median sternotomy for Bentall aortic root replacement (#27-mm  St. Jude/Hemashield valve conduit), and coronary artery bypass grafting x2  (left internal mammary artery to distal left anterior descending coronary  artery, saphenous vein graft to circumflex marginal branch, endoscopic  saphenous vein harvest from right thigh).   SURGEON:  Salvatore Decent. Cornelius Moras, M.D.   ASSISTANT:  Salvatore Decent. Dorris Fetch, M.D.   SECOND ASSISTANT:  Jerold Coombe, P.A.   ANESTHESIA:  General.   BRIEF CLINICAL NOTE:  The patient is a 65 year old gentleman from [place]  with known history of coronary artery disease, hypertension, gout, and  hyperlipidemia.  He originally presented with symptoms of exertional angina  in December of 2004 which ultimately prompted stress Cardiolite exam  followed by cardiac catheterization.  He was found to have high-grade  stenosis of the circumflex marginal branch, and he underwent percutaneous  coronary intervention and stenting of this vessel  He did well initially,  but he returns now with recurrent symptoms of exertional angina.  He also is  noted to have moderate aortic stenosis, and the severity of this has  increased over the six months since his previous evaluation.  Cardiac  catheterization confirmed the presence of moderate aortic stenosis as well  as two-vessel coronary artery disease.  Left ventricular function is  preserved.  A full consultation note has been dictated previously.   OPERATIVE CONSENT:  The patient and his family have been counseled at length  regarding the indications and potential benefits of aortic valve replacement  and coronary artery bypass grafting.  Alternative treatment strategies have  been discussed in detail.  He understands and accepts all associated risk of  surgery including but not limited to risk of death, stroke, myocardial  infarction, congestive heart failure, respiratory failure, pneumonia,  bleeding requiring blood transfusion, arrhythmia, heart block or bradycardia  requiring permanent pacemaker, recurrent coronary artery disease.  Relative  risks and benefits with regards to options for mechanical valve replacement  have been discussed at length.  Alternatives of a completely mechanical  valve with need for long-term anticoagulation have been discussed as well as  the alternative of a tissue bioprosthetic valve and its associated potential  for late degeneration or failure.  All  of his questions have been addressed.  All of his questions have been addressed.  The patient requests that we  proceed with mechanical valve replacement, and he accepts the associated  risks and need for long-term anticoagulation with Coumadin.   OPERATIVE NOTE IN DETAIL:  The patient is brought to the operating room on  the above-mentioned date, and central monitoring is established by the  anesthesia service under the care and direction of Dr. Jean Rosenthal.  Specifically, a Swan-Ganz catheter is placed through a right internal  jugular approach.  A radial arterial line is placed.  Intravenous  antibiotics are administered.  Following induction with general endotracheal  anesthesia, a Foley catheter is placed.  The patient's chest, abdomen,  both  groins, and both lower extremities are prepared and draped in a sterile  manner.   Baseline transesophageal echocardiogram is performed by Dr. Jean Rosenthal.  This  confirms the presence of a bicuspid aortic valve with severe aortic  stenosis.  Left ventricular function appears normal.  There is mild-to-  moderate left ventricular hypertrophy.  Of note, the aortic annulus is  relatively large, and there is aneurysmal dilatation of the entire aortic  root including the sinuses of Valsalva which measure greater than 4.7 mm in  diameter.  The ascending aorta itself continues to get larger beyond the  level of the sinotubular junction.   Endoscopic saphenous vein procurement is performed through a small incision  made just above the right knee along the medial aspect of the right thigh.  The greater saphenous vein is removed from the right thigh and is notably of  good quality.  After the vein is removed, the small incision is closed in  multiple layers in routine fashion.   The median sternotomy incision is performed, and the left internal mammary  artery is dissected from the chest wall and prepared by bypass grafting.  The left internal mammary artery is good quality conduit.  The patient is  heparinized systemically.   The pericardium is opened.  The ascending aorta is aneurysmally dilated.  In  the mid-portion of the ascending aorta, it measures in excess of greater  than 5 cm in transverse diameter on its external surface.  The aortic wall  appears somewhat thin.  There are no palpable plaques or calcifications.  The aortic root replacement is felt indicated to replace the aneurysmally  dilated proximal ascending thoracic aorta as well as the aneurysmal  dilatation of the sinotubular junction and the sinuses of Valsalva.   The ascending aorta is cannulated at the level of the takeoff of the  innominate artery.  A two-stage venous cannula is placed to the tip of the right atrial  appendage.  A retrograde cardioplegia catheter is placed  through the right atrium into the coronary sinus.  Adequate heparinization  is verified.   Cardiopulmonary bypass is begun.  A left ventricular vent is placed through  the right superior pulmonary vein.  The surface of the heart is inspected.  There is mild left ventricular hypertrophy.  Distal sites are selected for  coronary bypass grafting.  The portion of saphenous vein and the left  internal mammary artery are both trimmed to appropriate lengths.  A  temperature probe is placed in the left ventricular septum.  A cardioplegia  catheter is placed in the ascending aorta.   The patient is cooled to 28 degrees systemic temperature.  The aortic cross-  clamp is applied, and cardioplegia is delivered initially in an antegrade  fashion  through the aortic root.  Iced saline slush is applied for topical  hypothermia.  Supplemental cardioplegia is administered retrograde through  the coronary sinus catheter.  The initial cardioplegic arrest and myocardial  cooling are felt to be excellent.  Repeat doses of cardioplegia are  administered intermittently throughout the cross-clamp portion of the  operation both through the aortic root, down the subsequently placed vein  graft, and retrograde through the coronary sinus catheter to maintain septal  temperature below 15 degrees centigrade.  The following distal coronary  anastomoses are performed:  1.  The circumflex marginal branch is grafted  with a saphenous vein graft in end-to-side fashion.  This coronary measures  1.6 mm in diameter at the site of distal bypass and is of good quality.  2.  The distal left anterior descending coronary artery is grafted with the left  internal mammary artery in and end-to-side fashion.  There coronary artery  measures 1.7 mm in diameter at the site of distal bypass and is of good  quality.  It is diffusely diseased proximally.   The ascending aorta is  transected just above the sinotubular junction.  Aortic valve is inspected.  The aortic valve is bicuspid and severely  stenotic.  There is heavy calcification in portions of the aortic annulus  and the leaflets of the valve itself.  The left main coronary artery and the  right coronary artery are identified, and their locations are notably  somewhat high along the dilated sinuses of Valsalva.  The aortic valve is  excised sharply.  The aortic annulus is decalcified.  This is somewhat  tedious due to the large amount of calcifications, particularly in the  region involving the commissure between the left and right cusps of the  aortic valve anteriorly.  Ultimately, adequate decalcification of the aortic  annulus is completed.  The left ventricular chamber and aortic root are  irrigated with copious iced saline solution.  The left main coronary artery  and the right coronary artery are now both mobilized away from the aortic wall on small button pedicles.  The aortic root is sized to accept a 27-mm  St. Jude mechanical prosthesis.   Aortic root replacement is performed using a St. Jude/Hemashield mechanical  valve graft conduit (Model #[id number], Serial [id number]).  This is  performed with interrupted 2-0 Ethibond horizontal mattress pledgeted  sutures with pledgets in the supra-annular position.  After the proximal  suture line is completed, the suture line is reinforced with BioGlue.  The  valve is carefully inspected to make sure that it is completely seated, and  that the leaflets open and close normally.  Thermal cautery is now utilized  to create a small circular hole in the proximal wall of the Hemashield graft  immediately adjacent to the left main coronary artery.  The left main  coronary artery is now reanastomosed directly to the sidewall of the  Hemashield graft with running 6-0 Prolene suture.  This suture line is  reinforced with BioGlue.  Similarly, the right coronary  artery is now sewn  directly to the side of the Hemashield graft after making another small hole  in the graft with thermal cautery.  This suture line is reinforced with  BioGlue.  The distal end of the Hemashield graft is now trimmed to an  appropriate length, and the remainder of the aneurysmally dilated portion of  the ascending thoracic aorta is resected.  The distal anastomosis is now  completed in an end-to-end fashion with  running 4-0 Prolene suture.  This  suture line is reinforced with BioGlue.   The single proximal saphenous vein anastomosis is performed directly to the  Hemashield graft after making a small circular opening along the dorsal  surface of the graft with thermal cautery.  A Magoon needle is placed  directly into the Hemashield graft to serve as an aortic root vent.  The  patient is placed in steep Trendelenburg position.  Left internal mammary  artery clamp is removed, and the septal temperature is noted to rise  rapidly.  One final dose of warm retrograde hotshot cardioplegia is  administered.  The lungs are ventilated and the heart allowed to fill to  facilitate evacuation of any residual air from the pulmonary veins and left  heart circulation.  The aortic cross-clamp is removed after a total cross-  clamp time of 134 minutes.   The heart begins to beat spontaneously without need for cardioversion.  The  retrograde cardioplegia catheter is removed.  All suture lines and proximal  and distal coronary anastomoses are inspected for hemostasis and appropriate  graft orientation.  Epicardial pacing wires are affixed to the right  ventricular outflow tract. The left ventricular vent is removed, and  epicardial pacing wires are now affixed to the right atrial appendage.  The  patient is rewarmed to 37 degrees centigrade temperature.  The patient is  weaned from cardiopulmonary bypass without difficulty.  The patient's rhythm at separation from bypass is third-degree  A-V block with sinus rhythm.  A-V  sequential pacing is employed.  No inotropic support is required.  Total  cardioplegia bypass time for the operation is 168 minutes.   Follow-up transesophageal echocardiogram is performed by Dr. Jean Rosenthal after  separation from bypass.  This demonstrates a normally-functioning mechanical  aortic valve.  There is normal left ventricular function.  There is no  mitral regurgitation. There is insignificant residual air.  No other  abnormalities are noted.   The aortic root vent is removed.  The venous cannula is removed.  The aortic  cannula is removed and its cannulation site oversewn with Prolene suture.  Protamine is administered to reverse the anticoagulation.  The mediastinal  and the left chest are irrigated with saline solution containing vancomycin.  Meticulous surgical hemostasis is ascertained.   The mediastinum and the left chest are drained with three chest tubes placed  through separate stab incisions inferiorly.  The median sternotomy is closed  in routine fashion, and the soft tissues anterior to the sternum are closed  in multiple layers.  The skin is closed with a running subcuticular skin  closure.   The patient tolerated the procedure well and is transported to the surgical  intensive care unit in stable condition.  There are no intraoperative  complications.  All sponge, instrument, and needle counts are verified  correct at completion of the operation.  No blood products were  administered.                                               Salvatore Decent. Cornelius Moras, M.D.    CHO/MEDQ  D:  02/18/2004  T:  02/18/2004  Job:  [id number]   cc:   Willa Rough, M.D.   Arturo Morton. Riley Kill, M.D. [facility]   Gordy Savers, M.D. [facility]

## 2010-11-27 NOTE — Assessment & Plan Note (Signed)
[facility]                           ELECTROPHYSIOLOGY OFFICE NOTE   NAME:[name], [name]                      MRN:          [id number]  DATE:01/24/2006                            DOB:          Jul 24, 1945    PACEMAKER NOTE:  [name] was seen today in the clinic on the 16th of  July 2007 for follow up of his Hanna. Jude, Model [id number] identity.  The date of  implant was February 25, 2004 for complete heart block.  On interrogation of  his device today his battery voltage is 2.81. P-wave measured 4 mV with an  atrial capture threshold of 1.25v at 0.5 msec and an atrial lead impedence  of 509.  R-waves are greater than 12.5 mV with a ventricular capture  threshold of 0.625v at 0.5 msec and a ventricular lead impedence of 445.  There were 130 mode switch episodes noted totaling less than 1% of the time.  No changes were made in his parameters.  He does have Autocapture on.  He  will be seen again in 6 months' time.                                   Altha Harm, LPN                                Doylene Canning. Ladona Ridgel, MD   PO/MedQ  DD:  01/24/2006  DT:  01/25/2006  Job #:  [id number]

## 2010-11-27 NOTE — Assessment & Plan Note (Signed)
[facility]                         GASTROENTEROLOGY OFFICE NOTE   NAME:[name], [name]                      MRN:          [id number]  DATE:06/28/2006                            DOB:          1946/01/21    REASON FOR CONSULTATION:  Heme-positive stool.   ASSESSMENT:  Hemoccult positive stool in a man on Coumadin and aspirin.  His hemoglobin is normal.  There are no other signs of bleeding.  He has  a St. Jude aortic valve and two-vessel coronary artery disease with  coronary artery bypass grafting with LIMA to the LAD and a vein graft to  the circumflex marginal branch.  He also has a pacemaker.   RECOMMENDATIONS AND PLAN:  A colonoscopy and possible upper GI endoscopy  is appropriate.  Aortic valves are generally low risk for emboli.  It is  possible that he may be able to just hold his Coumadin for 3-4 days and  stay on his aspirin and have the procedure rather than bridging Lovenox.  I am going to check with Dr. Myrtis Ser, his cardiologist of record, on his  recommendations to see whether we need a Lovenox window versus simply  holding the Coumadin for a few days.  This is explained to the patient.  He understands the risks, benefits and indications.  Pending that answer  from Dr. Myrtis Ser (that may require a consult/office visit with Dr. Myrtis Ser)  would arrange the endoscopy procedures.   HISTORY:  A 65 year old white man recently found to have a hemoccult-  positive stool on digital rectal examination in Dr. Vernon Prey  office.  He had no bleeding, no bowel habit changes.  He had a  sigmoidoscopy several years ago, about 5 or 6 years ago, that was  negative.  His GI review of systems is otherwise negative.   MEDICATIONS:  1. Colchicine b.i.d.  2. Toprol-XL 25 mg daily.  3. Lasix 20 mg daily.  4. Klor-Con 20 mEq daily.  5. Altace 5 mg daily.  6. Zocor 40 mg daily.  7. Warfarin 10 mg daily.  8. Boniva 25 mg daily.  9. Multivitamin daily.  10.Omega-3 fatty acids daily.  11.Glucosamine 500 mg a day.  12.Chondroitin 400 mg daily.  13.Vitamin B12 daily.  14.Aspirin 81 mg daily.  15.Vitamin C daily.  16.Calcium daily.   DRUG ALLERGIES:  LOPRESSOR and CODEINE.   PAST MEDICAL HISTORY:  1. Aortic valve replacement.  2. Coronary artery bypass grafting.  3. Hypertension.  4. Pacemaker for high-grade AV block.  5. Hypertension.  6. Dyslipidemia.  7. Gout.  8. Osteoarthritis.   FAMILY HISTORY:  Heart disease in his father, no colon cancer.   SOCIAL HISTORY:  Married.  He has been a Nutritional therapist.  He is getting set to  retire.  Lives with his wife who is also a patient of mine.  Two sons,  one daughter.  There no alcohol, tobacco, or drugs.   REVIEW OF SYSTEMS:  See medical history form for full details.   PHYSICAL EXAMINATION:  GENERAL:  Well-developed, well-nourished, middle-  aged white man.  VITAL SIGNS:  Height 5 feet 6-and-three-quarter  inches, weight 198  pounds, blood pressure 138/68, pulse 62.  HEENT:  Eyes anicteric.  ENT:  Normal mouth, nose, pharynx.  NECK:  Supple without thyromegaly or mass.  CHEST:  Clear.  HEART:  S1, S2 with a metallic S2, 2/6 systolic murmur.  ABDOMEN:  Soft, nontender, without organomegaly or mass.  RECTAL:  Deferred.  LYMPHATIC:  No neck or supraclavicular nodes.  EXTREMITIES:  Show 1+ bilateral lower extremity edema.  SKIN:  Warm, dry, no acute rash in the areas observed.  PSYCHIATRIC:  He is alert and oriented x3.   I appreciate the opportunity to care for this patient.     Iva Boop, MD,FACG  Electronically Signed    CEG/MedQ  DD: 06/28/2006  DT: 06/28/2006  Job #: [id number]   cc:   Gordy Savers, MD  Luis Abed, MD, [facility]

## 2010-11-27 NOTE — Cardiovascular Report (Signed)
NAME:  [name], [name]                         ACCOUNT NO.:  [id number]   MEDICAL RECORD NO.:  [id number]                   PATIENT TYPE:  INP   LOCATION:  6525                                 FACILITY:  [facility]   PHYSICIAN:  Arturo Morton. Riley Kill, M.D. [facility]         DATE OF BIRTH:  October 13, 1945   DATE OF PROCEDURE:  DATE OF DISCHARGE:  02/13/2004                              CARDIAC CATHETERIZATION   INDICATIONS FOR PROCEDURE:  Mr. [name] is a 65 year old gentleman who  previously has undergone intervention to the obtuse marginal branch by Dr.  Samule Ohm.  At that time, he was noted to have aortic valvular stenosis by both  echocardiography and catheterization.  The echocardiogram at that time  suggested a valve area of 0.9 sq cm, and the catheterization study suggested  a 1.6 sq cm valve area.  He more recently has had some fairly classical  exertional angina and radionuclide imaging has suggested chest pain with  electrocardiographic abnormalities and a perfusion defect compatible with  ischemia.  He was set back up by Dr. Myrtis Ser for left heart catheterization.   PROCEDURES:  1. Left heart catheterization.  2. Selective coronary arteriography.  3. Proximal root aortography.   DESCRIPTION OF PROCEDURE:  The patient was brought to the catheterization  laboratory and prepped and draped in the usual sterile fashion.  Through an  anterior puncture, the femoral artery was entered.  Coronary arteriography  was performed using a standard Judkins catheter for the left and a right  bypass catheter for the right which has an anterior takeoff.  It was  difficulty crossing the aortic valve.  The aortic root appeared to be large  with a heavily calcified valve.  The aortic valve was subsequently crossed  with a straight wire and RCA catheter.   HEMODYNAMIC DATA:  1. Central aorta 117/68, mean 87.  2. Left ventricle 149/16.  3. Peak gradient 22 mm.  4. Aortic valve mean gradient 23.   ANGIOGRAPHIC  DATA:  1. The aortic valve itself is heavily calcified with what appears to be     fairly poor mobility.  2. The aortic root is dilated and the post stenotic dilatation and the     appearance of the valve would strongly suggest a bicuspid aortic valve     with a domed appearance.  There is post stenotic dilatation.  3. The left main coronary artery tapers distally and has about 30% distal     left main narrowing.  It is also calcified.  4. Left left anterior descending coronary artery courses to the apex.  The     LAD is really diffusely diseased.  There appears to be a mid vessel with     an MLD of about 2 mm and long area of calcified plaque.  The vessel opens     up distally to this, but then just after the takeoff of a third diagonal     branch  there is a focal stenosis of about 80% in the LAD. There are three     diagonal branches, all of which are relatively small.  5. The circumflex provides a first marginal branch which bifurcates, the     inferior bifurcation is stented.  The stent itself is patent, although     the proximal portion of the stent does not appear to be completely fully     dilated and has an appearance of being perhaps slightly hypodense.  This     runs off distally with good distal runoff.  The AV circumflex itself     provides additional marginal branch with about 30% narrowing distally.  6. The right coronary artery demonstrates about 40 to 50% narrowing at most     in the mid vessel which does not appear to be flow limiting.  Beyond     this, there is moderate calcification of the vessel and a fair amount of     luminal irregularity but no high grade critical areas of stenosis.  The     PDA itself has a fair amount of luminal irregularity.   CONCLUSION:  1. At least moderate aortic valve stenosis with calculated aortic valve     gradient of 23 mm.  2. Moderately severe segmental disease of the left anterior descending     artery with a mid high grade focal  stenosis.  3. Heavily calcified aortic valve  appears to be bicuspid with post stenotic     aortic root dilatation.   DISPOSITION:  I plan to get an echocardiogram  and review the case with Dr.  Myrtis Ser.  Problematic is the LAD which itself is not a perfect vessel for  percutaneous intervention.  The MLD of the diffusely diseased mid vessel is  at best 2 mm in most places and probably less than that segmentally  throughout.  There is a focal lesion distally which likely is causing his  ischemia.  One of the questions revolves around the LAD itself can be  properly revascularized to try to buy him some time until aortic valve  surgery is necessary.  Nonetheless, it would seem that valve surgery would  seem to be unlikely more than about five years ago, and we will need to  discuss whether the patient would be best served by an attempt at  percutaneous intervention to buy them some time, or going ahead and putting  a mammary to the LAD.  Unfortunately, the LAD itself is a diffusely diseased  vessel and the distal vessel does in fact appear to be suitable for  grafting.  An echocardiogram  will be obtained and final disposition done  once that information is obtained.                                               Arturo Morton. Riley Kill, M.D. [facility]    TDS/MEDQ  D:  02/16/2004  T:  02/17/2004  Job:  [id number]   cc:   [facility]

## 2010-11-27 NOTE — Op Note (Signed)
NAME:  [name], [name]                         ACCOUNT NO.:  [id number]   MEDICAL RECORD NO.:  [id number]                   PATIENT TYPE:  INP   LOCATION:  2002                                 FACILITY:  [facility]   PHYSICIAN:  Doylene Canning. Ladona Ridgel, M.D.               DATE OF BIRTH:  03/15/1946   DATE OF PROCEDURE:  02/25/2004  DATE OF DISCHARGE:                                 OPERATIVE REPORT   PROCEDURE PERFORMED:  Insertion of a dual chamber pacemaker.   INDICATIONS FOR PROCEDURE:  Intermittent complete heart block, status post  aortic valve replacement.   INTRODUCTION:  The patient is a 65 year old male with coronary disease and  aortic valve disease, who is status post bypass surgery and aortic valve  replacement.  Postoperatively, the patient developed intermittent complete  heart block and is now referred for permanent pacemaker insertion.   DESCRIPTION OF PROCEDURE:  After informed consent was obtained, the patient  was taken to the diagnostic electrophysiology laboratory in a fasting state.  After the usual preparation and draping, intravenous fentanyl and Midazolam  were given for sedation.  A total of 30 mL of lidocaine was infiltrated into  the left infraclavicular region. A 5 cm incision was carried out over this  region and electrocautery utilized to dissect down to the fascial plane.  The right subclavian vein was then punctured times two.  The St. Jude model  1488 58 cm active fixation pacing lead, serial #[id number] was advanced into  the right ventricle and mapping was carried out in the right ventricle.  At  the final site, R waves measured 10 mV and pacing threshold of 0.8 V at 0.5  msec with a pacing impedance of 785 ohms.  10V pacing in the right ventricle  did not stimulate the diaphragm.  With the ventricular lead in satisfactory  position, attention was then turned to placement of the atrial lead. The  atrial lead was again a St. Jude model 1488 52 cm lead, serial   [id number],  which was advanced into the right atrium.  Mapping was then carried out in  the right atrium. The P-waves measured  2 mV and the atrial threshold 0.8 V  at 0.5 msec with a pacing impedance of 407 ohms once the lead was actively  fixed.  Again 10 V pacing in the atrium did not stimulate the diaphragm.  With both atrial and ventricular leads in satisfactory position, they were  secured to the pectoralis fascia with a figure-of-eight silk suture.  In  addition, the sewing sleeve was secured with silk suture.  Electrocautery  was then utilized to make a subcutaneous tissue pocket.  Electrocautery was  then utilized to assure hemostasis.  Kanamycin irrigation was utilized to  irrigate the pocket.  The St. Jude Identity, XLDR model [phone] dual chamber  pacemaker, serial [id number] was then connected to the atrial and ventricular  pacing leads and placed  in the subcutaneous pocket.  The generator was  secured with silk suture.  Additional Kanamycin was utilized to irrigate the  pocket and the incision was then closed with a layer of 2-0 Vicryl followed  by a layer of 3-0 Vicryl followed by a layer of 4-0 Vicryl.  Benzoin was  painted on the skin and Steri-Strips were applied and pressure dressing  placed.  The patient returned to his room in satisfactory condition.   COMPLICATIONS:  There were no immediate procedure complications.   RESULTS:  This demonstrates successful insertion of a Medtronic dual chamber  pacemaker in a patient with intermittent complete heart block, status post  aortic valve replacement.                                               Doylene Canning. Ladona Ridgel, M.D.    GWT/MEDQ  D:  02/25/2004  T:  02/25/2004  Job:  [id number]   cc:   Salvatore Decent. Cornelius Moras, M.D.  [address]  [place]  [place] [address]   Willa Rough, M.D.   Gordy Savers, M.D. [facility]

## 2010-11-27 NOTE — Discharge Summary (Signed)
NAME:  [name], [name]                         ACCOUNT NO.:  [id number]   MEDICAL RECORD NO.:  [id number]                   PATIENT TYPE:  INP   LOCATION:  2002                                 FACILITY:  [facility]   PHYSICIAN:  Salvatore Decent. Cornelius Moras, M.D.              DATE OF BIRTH:  1945-07-24   DATE OF ADMISSION:  02/18/2004  DATE OF DISCHARGE:  02/28/2004                                 DISCHARGE SUMMARY   ANTICIPATED DATE OF DISCHARGE:  February 28, 2004.   ADMISSION DIAGNOSES:  1. Aortic stenosis.  2. Two-vessel coronary artery disease.  3. Class 2 exertional angina.   ADDITIONAL/DISCHARGE DIAGNOSES:  1. Aortic stenosis status post Bentall aortic root replacement with a 27     millimeter St. Jude mechanical valve and Hemashield conduit, completed on     February 18, 2004.  2. Two-vessel coronary artery disease status post coronary artery bypass     grafting times two, also completed on February 18, 2004.  3. History of Class 2 exertional angina.  4. High-grade A-V block status post DDD pacemaker placement, February 24, 2004.  5. Chronic anticoagulation for mechanical valve coverage.  6. Status post percutaneous intervention of the circumflex marginal branch,     December 2004.  7. Hypertension.  8. Hyperlipidemia.  9. Gout.   HOSPITAL MANAGEMENT/PROCEDURES:  1. Bentall aortic root replacement with a 27 millimeter St. Jude/Hemashield     valve conduit.  2. Coronary artery bypass grafting times two utilizing the left internal     mammary artery to the distal left anterior descending artery and     saphenous vein graft to the circumflex marginal branch, completed on     February 18, 2004.  3. Initiation of cardiac rehabilitation Phase I.  4. Placement of a dual chamber pacemaker for complete heart block, completed     on February 24, 2004.  5. Initiation of anticoagulation therapy for mechanical valve.   CONSULTATIONS:  1. Electrophysiology.  2. Cardiac rehab.  3. Case  management.   HISTORY OF PRESENT ILLNESS:  [name] is a 65 year old gentleman from  [place] with a known history of coronary artery disease, hypertension,  gout and hyperlipidemia.  Patient originally presented with symptoms of  exertional angina in December 2004, which ultimate prompted stress  Cardiolite exam followed by cardiac catheterization.  The patient was found  to have high-grade stenosis of the circumflex marginal branch and underwent  percutaneous coronary intervention and stenting of this vessel.  The patient  did well initially, but returned with recurrent symptoms of exertional  angina in early August 2005.  The patient underwent cardiac catheterization  and was found to have moderate aortic stenosis, which was increasing in  severity over the past six months.  Catheterization also revealed the  presence of two-vessel coronary artery disease.  The patient's left  ventricular function was preserved.  A surgical consultation was initiated  for these findings.   Dr. Cornelius Moras of [facility] saw the patient in consultation while he was an inpatient  in early August.  His impression that was indeed the patient had left main  coronary stenosis and two-vessel coronary artery disease with moderate  aortic stenosis that had progressed somewhat in comparison with previous  evaluation in December 2004.  He believed that the patient would best be  treated with elective aortic valve replacement and coronary artery bypass  grafting.  The risks, benefits and alternatives to the procedure were  discussed with the patient and his family at that time.  They were in  understanding and agreed to proceed with surgery.  The relative risks and  benefits of aortic valve replacement using a mechanical valve with  subsequent need for long-term anticoagulation versus the use of  bioprosthetic tissue valve, and the possible risk of late valve degeneration  and failure were all discussed with the patient  and his family.  After  considerable discussion [name] requested that we proceed with  mechanical valve replacement.  Dr. Cornelius Moras felt that this was an appropriate  decision.  Overall, the patient was hemodynamically stable and was deemed  appropriate for discharge at that time with plans to return at a later date  for elective cardiac surgery.   HOSPITAL COURSE:  [name] was then admitted to [facility]  electively on February 18, 2004.  At that time the patient was taken to the  operating room and underwent coronary artery bypass grafting times two  utilizing the left internal mammary artery to the left anterior descending  coronary artery and saphenous vein graft to  the circumflex marginal branch  as well as Bentall aortic root replacement with a 27 mm St. Jude/Hemashield  valve conduit.  The greater saphenous vein was endoscopically harvested from  the right thigh.  Overall, the patient tolerated this procedure well.  An  intraoperative transesophageal echocardiogram revealed a bicuspid aortic  valve with thickened leaflets and restricted motion.  After cardiopulmonary  bypass the aortic valve showed no perivalvular leak and the leaflets were  mobile without evidence of regurgitation.  The patient was then transferred  to the surgical intensive care unit in critical, but stable condition.   The patient's postoperative course was complicated by high-grade A-V block  with intermittent second and third degree A-V block.  The patient was VVI  paced appropriately postoperatively.  The patient's underlying rhythm was an  intermittent second to third degree A-V block with the heart rate in the  30s.  An electrophysiology consultation was initiated and the patient was  seen by Dr. Ladona Ridgel.  Dr. Ladona Ridgel felt that the patient would benefit greatly  from a dual chamber pacemaker placement for this conduction abnormality. The patient continued to be paced via an external pacemaker  until he was  taken to the cardiac catheterization suite on February 24, 2004.  At that time  a dual chamber pacemaker was placed by Dr. Lewayne Bunting.  There were no  immediate procedural complications.  There was successful insertion of a  Medtronic dual chamber pacemaker.  The patient tolerated this procedure well  and was transferred back to his regular room in stable condition.   The patient then continued in a normal sinus rhythm with a ventricular paced  beat of 85 beats/minute.  The pacemaker seemed to be functioning  appropriately.  Otherwise, Mr. Steege made steady progress towards recovery  after cardiac surgery.  He awoke from anesthesia  without any neurologic  deficits.  He was extubated without difficulty.  He was initiated on cardiac  rehab Phase I and was advanced as tolerated. The patient had resumed normal  bowel and bladder function.  He was tolerating a regular diet.  He remained  hemodynamically stable and afebrile.  He was diuresed appropriately for  slight volume overload.  His incisions were healing well without evidence of  infection.   The patient was deemed appropriate for the initiation of discharge planning  then on February 27, 2004.  At the time of discharge planning the patient's  vital signs were as follows.  The temperature was 99.2, pulse was 91 and  regular, respirations were 20 and unlabored,  blood pressure was 134/80, and  SpO2 was 91-95%n on room air.  The patient's weight is 228 pounds with a  preoperative weight of 197 pounds.  On physical exam the patient's heart is  in a regular rate and rhythm without evidence of regurgitation.  His chest  is clear to auscultation.  His incisions are healing well without evidence  of infection.  The patient had been initiated on anticoagulation therapy for  coverage of his mechanical valve.   Lab values at the time of initiation of discharge planning were as follows.  PT INR from February 27, 2004 were 14.2 and 1.1  respectively.  TSH was 2.154.  CBC showed WBC of 9.7, hemoglobin of 9.1, hematocrit of 27.1, and platelet  count of 317,000.  BMET showed sodium of 132, potassium of 3.9, chloride of  98, CO2 of 28, glucose of 114, BUN of 18, and creatinine of 1.1.  Two-view  chest x-ray completed on August 17th showed no evidence of pneumothorax.  There was good position of two placement electrodes.  There was noted stable  cardiomegaly.  There was mild atelectasis at the left base.  There was a  small left pleural effusion.   DISPOSITION:  We will continue as planned with discharge then on February 28, 2004 pending A.M. rounds and no change in the patient's clinical status.  The patient will received 7.5 mg of Coumadin on the evening of August 18th.  We anticipate that the patient will be discharged on 7.5 mg of Coumadin  daily, then as directed by [facility].   DISCHARGE MEDICATIONS:  1. Lipitor 40 mg daily. 2. Coumadin 7.5 mg daily.  3. Metoprolol 25 mg twice daily.  4. Lasix 40 mg daily for two weeks.  5. Potassium 20 mEq twice daily for two weeks.  6. Tylox 1-2 tablets every 4-6 hours as needed for pain.   DISCHARGE INSTRUCTIONS:  1. Activity - the patient is to avoid driving.  He is to avoid heavy lifting     or strenuous activity.  He is to continue to walk daily.  2. Diet - the patient should follow a low-fat, low-salt diet.  3. Wound care - the patient is to wash his incisions daily with soap and     water.  He may shower.  He is to notify the [facility] office if he has any     redness, swelling or drainage from his incision sites.   SPECIAL INSTRUCTIONS:  1. The patient is to follow up with the [facility] on     Thursday, March 12, 2004.  This is scheduled for  9:15 A.M.  2. The patient is also scheduled to see Dr. Ladona Ridgel at [facility] on     July 01, 2004  at 12:10 P.M.  3. The patient is to follow up with the Coumadin Clinic on Monday, August     22nd,  for his first PT INR lab draw.   FOLLOW-UP APPOINTMENTS:  The patient is scheduled to see Dr. Cornelius Moras on August  29th at 1:45 P.M.      Carolyn A. Eustaquio Boyden.                  Salvatore Decent. Cornelius Moras, M.D.    CAF/MEDQ  D:  02/27/2004  T:  02/29/2004  Job:  [id number]   cc:   [facility]   Doylene Canning. Ladona Ridgel, M.D.  [facility]   Arturo Morton. Riley Kill, M.D. [facility]   Willa Rough, M.D.

## 2010-11-27 NOTE — Cardiovascular Report (Signed)
NAME:  [name], [name]                         ACCOUNT NO.:  [id number]   MEDICAL RECORD NO.:  [id number]                   PATIENT TYPE:  OIB   LOCATION:  2899                                 FACILITY:  [facility]   PHYSICIAN:  Salvadore Farber, M.D.             DATE OF BIRTH:  1946-04-27   DATE OF PROCEDURE:  06/28/2003  DATE OF DISCHARGE:                              CARDIAC CATHETERIZATION   PROCEDURE:  Left and right heart catheterization, left ventriculography,  coronary angiography, stent to the OM-1.   INDICATION:  Mr. Oran is a 65 year old gentleman with hypertension and  dyslipidemia.  He recently presented with exertional chest pain and had  positive exercise test.  An echocardiogram demonstrated mild aortic  stenosis, but valve seem to move minimally raising concern that the  gradients obtained by echocardiogram underestimated the degree of stenosis.  He is therefore referred for diagnostic angiography and right and left heart  catheterization with an assessment of his aortic valve.   PROCEDURAL TECHNIQUE:  Informed consent was obtained.  Under 1% lidocaine  local anesthesia, a 6 French sheath was placed in the right femoral artery  and an 8 French sheath in the right femoral vein using the modified  Seldinger technique.  Right heart catheterization was performed using a  balloon-tip catheter.  Cardiac output was measured using both Fick and  thermodilution methods.  The valve was then crossed with some difficulty  using a JR-4 catheter and a straight wire.  The wire was exchanged for a J  tip wire and pigtail advanced over this into the ventricle.  Simultaneous  femoral artery and ventricular pressure measurements were made.  Ventriculography was then performed by power injection.  Coronary  angiography was then performed using JL-5 and AL-1 (AL-1 for the RCA).   These calculations and images demonstrated mild aortic stenosis with  moderate stenosis of the LAD and a  severe stenosis of the first obtuse  marginal branch.  Decision was made to treat the marginal percutaneously.   Anticoagulation was initiated with heparin and eptifibatide to achieve and  maintain an ACT of greater than 200 seconds.  A 6 [place] JL-5 guide was  advanced over wire and engaged in the ostium of the left main coronary.  An  IQ wire was manipulated into the distal portion of the first obtuse marginal  branch.  Attempt was made to dilate the lesion using a 2.0 x 6-mm cutting  balloon.  This was unable to be passed beyond the lesion.  Therefore, it was  dilated using a 2.0 x 9-mm Maverick.  After two inflations there remained  approximately 50% residual stenosis.  Decision was therefore made to stent.  A 2.5 x 12-mm Taxus stent was positioned across the lesion with the  beginning of the stent laying just in the crux of bifurcation.  It was  deployed at 14 atmospheres.  Final angiograms demonstrated no residual  stenosis  and no TIMI-3 flow to the distal vasculature.  The patient was  transferred to the holding room after having tolerated the procedure well.  Plavix was administered upon arrival to the holding room (300 mg).   COMPLICATIONS:  None.   FINDINGS:   HEMODYNAMICS:  1. RA 6/6/3, RV 25/5/6, PA 25/15/19, PCW 05/22/10, LV 142/5/18, aorta     132/69/94.  2. Aortic valve:  Peak gradient 10, mean gradient 13, calculated valve area     1.65 sq cm.   VENTRICULOGRAPHY:  EF 60% without regional wall motion abnormality.  No  mitral regurgitation.   CORONARY ANGIOGRAPHY:  1. Left main:  There is a 20% stenosis of the distal vessel.  It is     calcified.  2. LAD:  The LAD is a moderate size vessel giving rise to two small     diagonals.  The mid and distal vessel are small in caliber, approximately     2.0 mm.  There is a 50% stenosis of the mid vessel.  3. Circumflex:  Large vessel giving rise to three obtuse marginals.  There     is a 90% stenosis in the first obtuse  marginal just after a branch.  This     was treated with drug-eluting stent to no residual stenosis.  4. RCA:  Moderate size dominant vessel with a 30% stenosis proximally.   IMPRESSION/RECOMMENDATIONS:  1. 90% stenosis of the first obtuse marginal treated successfully with drug-     eluting stent placement.  Will treat with aspirin indefinitely and Plavix     for six months.  2. 50% stenosis of the left anterior descending which will be treated     medically.  3. Aortic stenosis, mild.  Will increase Lipitor dose to 40 mg per day as     there is some early data that aggressive statin therapy retards the     progression of aortic stenosis.                                               Salvadore Farber, M.D.    WED/MEDQ  D:  06/28/2003  T:  06/28/2003  Job:  [id number]   cc:   Gordy Savers, M.D. [facility]   Willa Rough, M.D.

## 2010-11-27 NOTE — Assessment & Plan Note (Signed)
[facility]                         ELECTROPHYSIOLOGY OFFICE NOTE   NAME:FRIDDLETaim, Wurm                      MRN:          [id number]  DATE:08/29/2006                            DOB:          11/13/1945    Mr. Belt was seen today in the clinic on August 29, 2006, for  follow-up of his St. Jude model number [id number] Identity.  Date of implant  was February 25, 2004, for complete heart block.  On interrogation of his  device today, his battery voltage is 2.79.  P waves measured 4 mV with  an atrial capture threshold of 1 V at 0.5 msec and an atrial lead  impedance of 513 Ohms.  R waves measured greater than 12.5 mV with a  ventricular pacing threshold of 0.625 V at 0.5 msec and a ventricular  lead impedance of 452 Ohms.  There were 218 mode switch episodes noted  totaling less than 1% of the time.  Auto capture is programmed on.  No  other change is made in his parameters and he will continue with his  phone checks on an every 3 month basis and return for an office visit in  one year's time.      Altha Harm, LPN  Electronically Signed      Doylene Canning. Ladona Ridgel, MD  Electronically Signed   PO/MedQ  DD: 08/29/2006  DT: 08/29/2006  Job #: [id number]

## 2010-12-23 ENCOUNTER — Ambulatory Visit (INDEPENDENT_AMBULATORY_CARE_PROVIDER_SITE_OTHER): Payer: Medicare Other | Admitting: *Deleted

## 2010-12-23 DIAGNOSIS — Z954 Presence of other heart-valve replacement: Secondary | ICD-10-CM

## 2010-12-23 DIAGNOSIS — I359 Nonrheumatic aortic valve disorder, unspecified: Secondary | ICD-10-CM

## 2010-12-23 LAB — POCT INR: INR: 2.3

## 2011-01-20 ENCOUNTER — Ambulatory Visit (INDEPENDENT_AMBULATORY_CARE_PROVIDER_SITE_OTHER): Payer: Medicare Other | Admitting: *Deleted

## 2011-01-20 DIAGNOSIS — I359 Nonrheumatic aortic valve disorder, unspecified: Secondary | ICD-10-CM

## 2011-01-20 DIAGNOSIS — Z954 Presence of other heart-valve replacement: Secondary | ICD-10-CM

## 2011-01-20 LAB — POCT INR: INR: 3.3

## 2011-02-01 ENCOUNTER — Encounter: Payer: Self-pay | Admitting: Internal Medicine

## 2011-02-01 DIAGNOSIS — I442 Atrioventricular block, complete: Secondary | ICD-10-CM

## 2011-02-15 ENCOUNTER — Ambulatory Visit (INDEPENDENT_AMBULATORY_CARE_PROVIDER_SITE_OTHER): Payer: Medicare Other | Admitting: *Deleted

## 2011-02-15 DIAGNOSIS — I359 Nonrheumatic aortic valve disorder, unspecified: Secondary | ICD-10-CM

## 2011-02-15 DIAGNOSIS — I442 Atrioventricular block, complete: Secondary | ICD-10-CM

## 2011-02-15 DIAGNOSIS — Z954 Presence of other heart-valve replacement: Secondary | ICD-10-CM

## 2011-02-15 NOTE — Progress Notes (Signed)
Pacer check

## 2011-02-17 ENCOUNTER — Encounter: Payer: Self-pay | Admitting: Cardiology

## 2011-03-16 ENCOUNTER — Encounter: Payer: Self-pay | Admitting: Cardiology

## 2011-03-16 DIAGNOSIS — Z951 Presence of aortocoronary bypass graft: Secondary | ICD-10-CM | POA: Insufficient documentation

## 2011-03-16 DIAGNOSIS — Z7901 Long term (current) use of anticoagulants: Secondary | ICD-10-CM | POA: Insufficient documentation

## 2011-03-16 DIAGNOSIS — R9430 Abnormal result of cardiovascular function study, unspecified: Secondary | ICD-10-CM | POA: Insufficient documentation

## 2011-03-16 DIAGNOSIS — I739 Peripheral vascular disease, unspecified: Secondary | ICD-10-CM

## 2011-03-18 ENCOUNTER — Ambulatory Visit (INDEPENDENT_AMBULATORY_CARE_PROVIDER_SITE_OTHER): Payer: Medicare Other | Admitting: *Deleted

## 2011-03-18 ENCOUNTER — Ambulatory Visit (INDEPENDENT_AMBULATORY_CARE_PROVIDER_SITE_OTHER): Payer: Medicare Other | Admitting: Cardiology

## 2011-03-18 ENCOUNTER — Encounter: Payer: Self-pay | Admitting: Cardiology

## 2011-03-18 DIAGNOSIS — I359 Nonrheumatic aortic valve disorder, unspecified: Secondary | ICD-10-CM

## 2011-03-18 DIAGNOSIS — I779 Disorder of arteries and arterioles, unspecified: Secondary | ICD-10-CM

## 2011-03-18 DIAGNOSIS — Z7901 Long term (current) use of anticoagulants: Secondary | ICD-10-CM

## 2011-03-18 DIAGNOSIS — Z954 Presence of other heart-valve replacement: Secondary | ICD-10-CM

## 2011-03-18 DIAGNOSIS — R42 Dizziness and giddiness: Secondary | ICD-10-CM

## 2011-03-18 DIAGNOSIS — I2510 Atherosclerotic heart disease of native coronary artery without angina pectoris: Secondary | ICD-10-CM

## 2011-03-18 DIAGNOSIS — I442 Atrioventricular block, complete: Secondary | ICD-10-CM

## 2011-03-18 NOTE — Patient Instructions (Signed)
Your physician wants you to follow-up in: one year You will receive a reminder letter in the mail two months in advance. If you don't receive a letter, please call our office to schedule the follow-up appointment.  

## 2011-03-18 NOTE — Assessment & Plan Note (Signed)
Is not having any recurrent dizziness.  He's had no syncope.  His pacing function appears to be normal.  He follows with Dr. Ladona Ridgel.

## 2011-03-18 NOTE — Assessment & Plan Note (Signed)
Coronary disease is stable.  No further workup is needed.  I have chosen not to proceed with any exercise testing at this time.

## 2011-03-18 NOTE — Assessment & Plan Note (Signed)
He continues on Coumadin indefinitely.

## 2011-03-18 NOTE — Assessment & Plan Note (Signed)
He had minimal carotid disease in the past.  I do not hear any bruits.  When I see him next year we will consider a followup carotid Doppler.

## 2011-03-18 NOTE — Assessment & Plan Note (Signed)
Aortic valve closure is crisp.  The patient is doing well.  He had an echo in 2011 with good function.  No further workup is needed.

## 2011-03-18 NOTE — Progress Notes (Signed)
HPI Patient is seen today to followup coronary disease and aortic valve replacement.  The patient had a Bentall procedure in 2005.  He received a mechanical aortic valve to a new aortic root conduit.  We know this is working well by echo in March of 2011.  His treated his aortic stenosis.  He also underwent bypass.  In addition he had A-V block and received a pacemaker.  Is not having any chest pain.  There's been no syncope or presyncope.  He is not having any significant dizziness.  As part of today's evaluation I have reviewed her old chart record and I have completely updated no chronic medical record carefully. Allergies  Allergen Reactions  . Codeine Phosphate     REACTION: difficulty breathing  . Metoprolol Tartrate     REACTION: loss of vision    Current Outpatient Prescriptions  Medication Sig Dispense Refill  . Ascorbic Acid (VITAMIN C) 500 MG tablet Take 500 mg by mouth daily.        Marland Kitchen aspirin 81 MG tablet Take 81 mg by mouth daily.        . Calcium Carbonate (CALCIUM 500 PO) Take by mouth daily.        . furosemide (LASIX) 40 MG tablet Take 1 tablet (40 mg total) by mouth 2 (two) times daily.  180 tablet  6  . Glucosamine-Chondroitin 250-200 MG CAPS Take by mouth daily.        . Meclizine HCl (BONINE) 25 MG CHEW Chew 1 tablet by mouth daily.        . metoprolol (TOPROL-XL) 100 MG 24 hr tablet Take 1 tablet (100 mg total) by mouth daily.  90 tablet  6  . Multiple Vitamin (MULTIVITAMIN) tablet Take 1 tablet by mouth daily.        . nitroGLYCERIN (NITROSTAT) 0.4 MG SL tablet Place 0.4 mg under the tongue every 5 (five) minutes as needed.        . Omega-3 Fatty Acids (FISH OIL) 1000 MG CAPS Take by mouth daily.        . potassium chloride (KLOR-CON 10) 10 MEQ CR tablet Take 1 tablet (10 mEq total) by mouth 2 (two) times daily.  180 tablet  6  . ramipril (ALTACE) 10 MG capsule Take 1 capsule (10 mg total) by mouth daily.  90 capsule  6  . rosuvastatin (CRESTOR) 20 MG tablet Take 1  tablet (20 mg total) by mouth daily.  90 tablet  6  . tadalafil (CIALIS) 10 MG tablet Take 10 mg by mouth daily as needed.        . vitamin B-12 (CYANOCOBALAMIN) 100 MCG tablet Take 100 mcg by mouth daily.        Marland Kitchen warfarin (COUMADIN) 10 MG tablet Take 1 tablet (10 mg total) by mouth as directed.  40 tablet  3    History   Social History  . Marital Status: Married    Spouse Name: N/A    Number of Children: N/A  . Years of Education: N/A   Occupational History  . Not on file.   Social History Main Topics  . Smoking status: Former Smoker    Quit date: 07/12/1973  . Smokeless tobacco: Not on file  . Alcohol Use: No  . Drug Use: Not on file  . Sexually Active: Not on file   Other Topics Concern  . Not on file   Social History Narrative  . No narrative on file    Family History  Problem Relation Age of Onset  . Heart attack Father   . Cancer Father     renal cancer  . Diabetes Brother     Past Medical History  Diagnosis Date  . Aortic stenosis 08/25/2010    AVR  2005  . AORTIC VALVE REPLACEMENT, HX OF 05/23/2009    2005   Bental with aortic root conduit and mechanical AVR  . AV BLOCK, COMPLETE 11/01/2008    PTVDP  02/2004  . CAD (coronary artery disease) 01/16/2007    CABG  2005  . DIVERTICULOSIS, COLON 10/17/2007  . DIZZINESS 06/19/2009  . GOUT 01/16/2007  . HEMORRHOIDS, INTERNAL 11/01/2008  . HYPERLIPIDEMIA 01/16/2007  . HYPERTENSION 01/16/2007  . LEG EDEMA 11/27/2008  . PACEMAKER, PERMANENT 11/01/2008    Dr Ladona Ridgel 02/2004  . Impaired glucose tolerance   . Carotid artery disease     minimal, doppler 2005  . Special screening for malignant neoplasm of prostate   . Warfarin anticoagulation     AVR  . Ejection fraction     50-55%, echo, 09/2009, inferior/posterior,apical hypo  . Hx of CABG     2005    Past Surgical History  Procedure Date  . Coronary artery bypass graft   . Aortic valve replacement   . Pacemaker insertion   . Shoulder surgery   . Cardiac  catheterization 06/2003  . Circumcision   . Colonoscopy     ROS  Patient denies fever, chills, headache, rash, change in vision, change in hearing, chest pain, cough, nausea vomiting, urinary symptoms.  All other systems are reviewed and are negative.  PHYSICAL EXAM Patient is stable.  He is overweight.  He is oriented to person time and place.  Affect is normal.  Head is atraumatic.  There is no jugular venous distention.  Lungs are clear.  Respiratory effort is nonlabored.  Cardiac exam reveals a nostril and S2.  There is crisp closure sound of his mechanical aortic prosthesis.  The abdomen is protuberant but soft.  There is no peripheral edema.  No skin rashes.  There are no musculoskeletal deformities.  Filed Vitals:   03/18/11 0905  BP: 120/88  Pulse: 60  Height: 5\' 7"  (1.702 m)  Weight: 249 lb (112.946 kg)   EKG is done today and reviewed by me.  He is pacing with AV sequential pacing.  Rhythm is sinus.  ASSESSMENT & PLAN

## 2011-03-31 ENCOUNTER — Encounter: Payer: Self-pay | Admitting: Internal Medicine

## 2011-03-31 ENCOUNTER — Ambulatory Visit (INDEPENDENT_AMBULATORY_CARE_PROVIDER_SITE_OTHER): Payer: Medicare Other | Admitting: Internal Medicine

## 2011-03-31 DIAGNOSIS — E785 Hyperlipidemia, unspecified: Secondary | ICD-10-CM

## 2011-03-31 DIAGNOSIS — Z0000 Encounter for general adult medical examination without abnormal findings: Secondary | ICD-10-CM

## 2011-03-31 DIAGNOSIS — I779 Disorder of arteries and arterioles, unspecified: Secondary | ICD-10-CM

## 2011-03-31 DIAGNOSIS — I10 Essential (primary) hypertension: Secondary | ICD-10-CM

## 2011-03-31 DIAGNOSIS — Z7901 Long term (current) use of anticoagulants: Secondary | ICD-10-CM

## 2011-03-31 DIAGNOSIS — R609 Edema, unspecified: Secondary | ICD-10-CM

## 2011-03-31 DIAGNOSIS — I2510 Atherosclerotic heart disease of native coronary artery without angina pectoris: Secondary | ICD-10-CM

## 2011-03-31 DIAGNOSIS — Z23 Encounter for immunization: Secondary | ICD-10-CM

## 2011-03-31 MED ORDER — WARFARIN SODIUM 10 MG PO TABS: 10.0000 mg | ORAL_TABLET | ORAL | Status: DC

## 2011-03-31 NOTE — Patient Instructions (Signed)
Limit your sodium (Salt) intake  You need to lose weight.  Consider a lower calorie diet and regular exercise.    It is important that you exercise regularly, at least 20 minutes 3 to 4 times per week.  If you develop chest pain or shortness of breath seek  medical attention.  Return in 6 months for follow-up  

## 2011-03-31 NOTE — Progress Notes (Signed)
  Subjective:    Patient ID: [name], male    DOB: May 12, 1946, 65 y.o.   MRN: [id number]  HPI  65 year old patient who is seen today for followup. He has a history of coronary artery disease as well as aortic valve replacement surgery. He is on chronic Coumadin anticoagulation. He is doing quite well. He is enrolled in Weight Watchers program and has had some early success. Denies any cardiopulmonary complaints. He does have a history of impaired glucose tolerance. He has treated hypertension. He has a history of gout which has been stable. He has dyslipidemia treated with Crestor.    Review of Systems  Constitutional: Negative for fever, chills, appetite change and fatigue.  HENT: Negative for hearing loss, ear pain, congestion, sore throat, trouble swallowing, neck stiffness, dental problem, voice change and tinnitus.   Eyes: Negative for pain, discharge and visual disturbance.  Respiratory: Negative for cough, chest tightness, wheezing and stridor.   Cardiovascular: Positive for leg swelling. Negative for chest pain and palpitations.  Gastrointestinal: Negative for nausea, vomiting, abdominal pain, diarrhea, constipation, blood in stool and abdominal distention.  Genitourinary: Negative for urgency, hematuria, flank pain, discharge, difficulty urinating and genital sores.  Musculoskeletal: Negative for myalgias, back pain, joint swelling, arthralgias and gait problem.  Skin: Negative for rash.  Neurological: Negative for dizziness, syncope, speech difficulty, weakness, numbness and headaches.  Hematological: Negative for adenopathy. Does not bruise/bleed easily.  Psychiatric/Behavioral: Negative for behavioral problems and dysphoric mood. The patient is not nervous/anxious.        Objective:   Physical Exam  Constitutional: He is oriented to person, place, and time. He appears well-developed.       Blood pressure 120/80  HENT:  Head: Normocephalic.  Right Ear: External ear  normal.  Left Ear: External ear normal.  Eyes: Conjunctivae and EOM are normal.  Neck: Normal range of motion.  Cardiovascular: Normal rate.        Prosthetic heart sounds  Pulmonary/Chest: Breath sounds normal.  Abdominal: Bowel sounds are normal.  Musculoskeletal: Normal range of motion. He exhibits edema. He exhibits no tenderness.       +2 lower extremity edema  Neurological: He is alert and oriented to person, place, and time.  Psychiatric: He has a normal mood and affect. His behavior is normal.          Assessment & Plan:   Impaired glucose tolerance- stable with random blood sugar 150 Hypertension. Stable and well controlled we'll continue present regimen Status post aortic valve disease. Clinically stable Chronic Coumadin therapy Lower extremity edema. Will continue on furosemide present dose  We'll continue restricted salt diet and efforts at weight loss. His medical regimen will be unchanged we'll continue chronic Coumadin anticoagulation. Will followup with cardiology as planned as well as orthopedics. He will see back in 6 months for a complete exam

## 2011-04-15 ENCOUNTER — Ambulatory Visit (INDEPENDENT_AMBULATORY_CARE_PROVIDER_SITE_OTHER): Payer: Medicare Other | Admitting: *Deleted

## 2011-04-15 ENCOUNTER — Encounter: Payer: Medicare Other | Admitting: *Deleted

## 2011-04-15 DIAGNOSIS — Z954 Presence of other heart-valve replacement: Secondary | ICD-10-CM

## 2011-04-15 DIAGNOSIS — I359 Nonrheumatic aortic valve disorder, unspecified: Secondary | ICD-10-CM

## 2011-04-29 ENCOUNTER — Telehealth: Payer: Self-pay | Admitting: *Deleted

## 2011-04-29 MED ORDER — PRAVASTATIN SODIUM 40 MG PO TABS: 40.0000 mg | ORAL_TABLET | Freq: Every evening | ORAL | Status: DC

## 2011-04-29 NOTE — Telephone Encounter (Signed)
Left message on pt's personal voicemail 

## 2011-04-29 NOTE — Telephone Encounter (Signed)
Pravastatin 40 mg #90 one daily

## 2011-04-29 NOTE — Telephone Encounter (Signed)
Wife is calling to see if Dr. Kirtland Bouchard would change Pt from Crestor to Pravastatin.  It is less expensive.

## 2011-05-08 ENCOUNTER — Encounter: Payer: Self-pay | Admitting: Internal Medicine

## 2011-05-08 DIAGNOSIS — I442 Atrioventricular block, complete: Secondary | ICD-10-CM

## 2011-05-13 ENCOUNTER — Ambulatory Visit (INDEPENDENT_AMBULATORY_CARE_PROVIDER_SITE_OTHER): Payer: Medicare Other | Admitting: *Deleted

## 2011-05-13 DIAGNOSIS — Z954 Presence of other heart-valve replacement: Secondary | ICD-10-CM

## 2011-05-13 DIAGNOSIS — Z7901 Long term (current) use of anticoagulants: Secondary | ICD-10-CM

## 2011-05-13 DIAGNOSIS — I359 Nonrheumatic aortic valve disorder, unspecified: Secondary | ICD-10-CM

## 2011-06-10 ENCOUNTER — Ambulatory Visit (INDEPENDENT_AMBULATORY_CARE_PROVIDER_SITE_OTHER): Payer: Medicare Other | Admitting: *Deleted

## 2011-06-10 DIAGNOSIS — Z7901 Long term (current) use of anticoagulants: Secondary | ICD-10-CM

## 2011-06-10 DIAGNOSIS — I359 Nonrheumatic aortic valve disorder, unspecified: Secondary | ICD-10-CM

## 2011-06-10 DIAGNOSIS — Z954 Presence of other heart-valve replacement: Secondary | ICD-10-CM

## 2011-06-10 LAB — POCT INR: INR: 3.1

## 2011-07-08 ENCOUNTER — Ambulatory Visit (INDEPENDENT_AMBULATORY_CARE_PROVIDER_SITE_OTHER): Payer: Medicare Other | Admitting: *Deleted

## 2011-07-08 DIAGNOSIS — I359 Nonrheumatic aortic valve disorder, unspecified: Secondary | ICD-10-CM

## 2011-07-08 DIAGNOSIS — Z954 Presence of other heart-valve replacement: Secondary | ICD-10-CM

## 2011-07-08 DIAGNOSIS — Z7901 Long term (current) use of anticoagulants: Secondary | ICD-10-CM

## 2011-07-08 LAB — POCT INR: INR: 3.4

## 2011-08-05 ENCOUNTER — Ambulatory Visit (INDEPENDENT_AMBULATORY_CARE_PROVIDER_SITE_OTHER): Payer: Self-pay | Admitting: *Deleted

## 2011-08-05 DIAGNOSIS — Z7901 Long term (current) use of anticoagulants: Secondary | ICD-10-CM

## 2011-08-05 DIAGNOSIS — I359 Nonrheumatic aortic valve disorder, unspecified: Secondary | ICD-10-CM

## 2011-08-05 DIAGNOSIS — Z954 Presence of other heart-valve replacement: Secondary | ICD-10-CM

## 2011-08-05 LAB — POCT INR: INR: 2.8

## 2011-08-09 ENCOUNTER — Encounter: Payer: Self-pay | Admitting: Internal Medicine

## 2011-08-09 DIAGNOSIS — I442 Atrioventricular block, complete: Secondary | ICD-10-CM

## 2011-09-09 ENCOUNTER — Encounter: Payer: Self-pay | Admitting: Internal Medicine

## 2011-09-09 ENCOUNTER — Ambulatory Visit (INDEPENDENT_AMBULATORY_CARE_PROVIDER_SITE_OTHER): Payer: Medicare Other | Admitting: *Deleted

## 2011-09-09 ENCOUNTER — Ambulatory Visit (INDEPENDENT_AMBULATORY_CARE_PROVIDER_SITE_OTHER): Payer: Medicare Other | Admitting: Internal Medicine

## 2011-09-09 DIAGNOSIS — I359 Nonrheumatic aortic valve disorder, unspecified: Secondary | ICD-10-CM

## 2011-09-09 DIAGNOSIS — Z954 Presence of other heart-valve replacement: Secondary | ICD-10-CM

## 2011-09-09 DIAGNOSIS — Z950 Presence of cardiac pacemaker: Secondary | ICD-10-CM

## 2011-09-09 DIAGNOSIS — Z7901 Long term (current) use of anticoagulants: Secondary | ICD-10-CM

## 2011-09-09 DIAGNOSIS — I442 Atrioventricular block, complete: Secondary | ICD-10-CM

## 2011-09-09 DIAGNOSIS — I2510 Atherosclerotic heart disease of native coronary artery without angina pectoris: Secondary | ICD-10-CM

## 2011-09-09 LAB — PACEMAKER DEVICE OBSERVATION
AL AMPLITUDE: 1.9 mv
AL IMPEDENCE PM: 535 Ohm
ATRIAL PACING PM: 63
BAMS-0001: 170 {beats}/min
VENTRICULAR PACING PM: 100

## 2011-09-09 NOTE — Patient Instructions (Signed)
Your physician wants you to follow-up in: 12 months with Dr. Taylor. You will receive a reminder letter in the mail two months in advance. If you don't receive a letter, please call our office to schedule the follow-up appointment.    

## 2011-09-09 NOTE — Assessment & Plan Note (Signed)
Pacemaker interrogation demonstrates normal function. We'll plan to recheck in several months. 

## 2011-09-09 NOTE — Progress Notes (Signed)
HPI [name] returns today for followup. He is a pleasant 66 yo man with a h/o AS, s/p AVR/with a Bentall procedure done 8 yrs ago. He had CHB and underwent permanent pacemaker insertion. He returns today for followup. He denies chest pain or shortness of breath. He admits to dietary indiscretion. He has been compliant with his medications. No syncope. No fevers or chills. Allergies  Allergen Reactions  . Codeine Phosphate     REACTION: difficulty breathing  . Metoprolol Tartrate     REACTION: loss of vision     Current Outpatient Prescriptions  Medication Sig Dispense Refill  . Ascorbic Acid (VITAMIN C) 500 MG tablet Take 500 mg by mouth daily.        Marland Kitchen aspirin 81 MG tablet Take 81 mg by mouth daily.        . Calcium Carbonate (CALCIUM 500 PO) Take by mouth daily.        . furosemide (LASIX) 40 MG tablet Take 1 tablet (40 mg total) by mouth 2 (two) times daily.  180 tablet  6  . Glucosamine-Chondroitin 250-200 MG CAPS Take by mouth daily.        . Meclizine HCl (BONINE) 25 MG CHEW Chew 1 tablet by mouth daily.        . metoprolol (TOPROL-XL) 100 MG 24 hr tablet Take 1 tablet (100 mg total) by mouth daily.  90 tablet  6  . Multiple Vitamin (MULTIVITAMIN) tablet Take 1 tablet by mouth daily.        . nitroGLYCERIN (NITROSTAT) 0.4 MG SL tablet Place 0.4 mg under the tongue every 5 (five) minutes as needed.        . Omega-3 Fatty Acids (FISH OIL) 1000 MG CAPS Take 1,000 mg by mouth 2 (two) times daily.       . potassium chloride (KLOR-CON 10) 10 MEQ CR tablet Take 1 tablet (10 mEq total) by mouth 2 (two) times daily.  180 tablet  6  . pravastatin (PRAVACHOL) 40 MG tablet Take 1 tablet (40 mg total) by mouth every evening.  90 tablet  1  . ramipril (ALTACE) 10 MG capsule Take 1 capsule (10 mg total) by mouth daily.  90 capsule  6  . tadalafil (CIALIS) 10 MG tablet Take 10 mg by mouth daily as needed.        . vitamin B-12 (CYANOCOBALAMIN) 100 MCG tablet Take 100 mcg by mouth daily.          Marland Kitchen warfarin (COUMADIN) 10 MG tablet Take 1 tablet (10 mg total) by mouth as directed.  90 tablet  3     Past Medical History  Diagnosis Date  . Aortic stenosis 08/25/2010    AVR  2005  . AORTIC VALVE REPLACEMENT, HX OF 05/23/2009    2005   Bental with aortic root conduit and mechanical AVR  . AV BLOCK, COMPLETE 11/01/2008    PTVDP  02/2004  . CAD (coronary artery disease) 01/16/2007    CABG  2005  . DIVERTICULOSIS, COLON 10/17/2007  . DIZZINESS 06/19/2009  . GOUT 01/16/2007  . HEMORRHOIDS, INTERNAL 11/01/2008  . HYPERLIPIDEMIA 01/16/2007  . HYPERTENSION 01/16/2007  . LEG EDEMA 11/27/2008  . PACEMAKER, PERMANENT 11/01/2008    Dr Ladona Ridgel 02/2004  . Impaired glucose tolerance   . Carotid artery disease     minimal, doppler 2005  . Special screening for malignant neoplasm of prostate   . Warfarin anticoagulation     AVR  . Ejection fraction  50-55%, echo, 09/2009, inferior/posterior,apical hypo  . Hx of CABG     2005    ROS:   All systems reviewed and negative except as noted in the HPI.   Past Surgical History  Procedure Date  . Coronary artery bypass graft   . Aortic valve replacement   . Pacemaker insertion   . Shoulder surgery   . Cardiac catheterization 06/2003  . Circumcision   . Colonoscopy      Family History  Problem Relation Age of Onset  . Heart attack Father   . Cancer Father     renal cancer  . Diabetes Brother      History   Social History  . Marital Status: Married    Spouse Name: N/A    Number of Children: N/A  . Years of Education: N/A   Occupational History  . Not on file.   Social History Main Topics  . Smoking status: Former Smoker    Quit date: 07/12/1973  . Smokeless tobacco: Never Used  . Alcohol Use: No  . Drug Use: Not on file  . Sexually Active: Not on file   Other Topics Concern  . Not on file   Social History Narrative  . No narrative on file     BP 158/88  Pulse 64  Ht 5' 7.75" (1.721 m)  Wt 117.536 kg (259 lb 1.9 oz)   BMI 39.69 kg/m2  Physical Exam:  Well appearing middle-aged man, NAD HEENT: Unremarkable Neck:  No JVD, no thyromegally Lungs:  Clear with no wheezes, rales, or rhonchi. HEART:  Regular rate rhythm, no murmurs, no rubs, no clicks. Crisp mechanical aortic valve closure sound is present. Abd:  soft, positive bowel sounds, no organomegally, no rebound, no guarding Ext:  2 plus pulses, no edema, no cyanosis, no clubbing Skin:  No rashes no nodules Neuro:  CN II through XII intact, motor grossly intact  DEVICE  Normal device function.  See PaceArt for details.   Assess/Plan:

## 2011-09-09 NOTE — Assessment & Plan Note (Signed)
He denies anginal symptoms. He will continue his current medical therapy. 

## 2011-09-09 NOTE — Assessment & Plan Note (Signed)
Physical exam demonstrates that his valve closure sound is normal. No evidence of regurgitant or stenosis by exam. He has not had a 2-D echo in many years. As long as he remains stable, we will wait till his 10 year anniversary from his valve replacement before repeating his echo.

## 2011-09-30 ENCOUNTER — Other Ambulatory Visit (INDEPENDENT_AMBULATORY_CARE_PROVIDER_SITE_OTHER): Payer: Medicare Other

## 2011-09-30 DIAGNOSIS — Z0000 Encounter for general adult medical examination without abnormal findings: Secondary | ICD-10-CM

## 2011-09-30 DIAGNOSIS — Z79899 Other long term (current) drug therapy: Secondary | ICD-10-CM

## 2011-09-30 DIAGNOSIS — Z125 Encounter for screening for malignant neoplasm of prostate: Secondary | ICD-10-CM

## 2011-09-30 DIAGNOSIS — I2510 Atherosclerotic heart disease of native coronary artery without angina pectoris: Secondary | ICD-10-CM

## 2011-09-30 LAB — HEPATIC FUNCTION PANEL
AST: 23 U/L (ref 0–37)
Albumin: 4 g/dL (ref 3.5–5.2)
Total Protein: 6.6 g/dL (ref 6.0–8.3)

## 2011-09-30 LAB — CBC WITH DIFFERENTIAL/PLATELET
Eosinophils Absolute: 0.1 10*3/uL (ref 0.0–0.7)
MCHC: 33.6 g/dL (ref 30.0–36.0)
MCV: 87.6 fl (ref 78.0–100.0)
Monocytes Absolute: 0.4 10*3/uL (ref 0.1–1.0)
Neutrophils Relative %: 67.1 % (ref 43.0–77.0)
Platelets: 148 10*3/uL — ABNORMAL LOW (ref 150.0–400.0)

## 2011-09-30 LAB — POCT URINALYSIS DIPSTICK
Bilirubin, UA: NEGATIVE
Glucose, UA: NEGATIVE
Ketones, UA: NEGATIVE
Leukocytes, UA: NEGATIVE
pH, UA: 5.5

## 2011-09-30 LAB — PSA: PSA: 0.92 ng/mL (ref 0.10–4.00)

## 2011-09-30 LAB — BASIC METABOLIC PANEL
BUN: 20 mg/dL (ref 6–23)
CO2: 27 mEq/L (ref 19–32)
Chloride: 100 mEq/L (ref 96–112)
Creatinine, Ser: 1 mg/dL (ref 0.4–1.5)

## 2011-09-30 LAB — LIPID PANEL
HDL: 42 mg/dL (ref >39.00)
Triglycerides: 173 mg/dL — ABNORMAL HIGH (ref 0.0–149.0)

## 2011-09-30 LAB — TSH: TSH: 1.39 u[IU]/mL (ref 0.35–5.50)

## 2011-10-06 ENCOUNTER — Encounter: Payer: Self-pay | Admitting: Internal Medicine

## 2011-10-07 ENCOUNTER — Ambulatory Visit (INDEPENDENT_AMBULATORY_CARE_PROVIDER_SITE_OTHER): Payer: Medicare Other | Admitting: *Deleted

## 2011-10-07 ENCOUNTER — Ambulatory Visit (INDEPENDENT_AMBULATORY_CARE_PROVIDER_SITE_OTHER): Payer: Medicare Other | Admitting: Internal Medicine

## 2011-10-07 ENCOUNTER — Encounter: Payer: Self-pay | Admitting: Internal Medicine

## 2011-10-07 VITALS — BP 120/84 | HR 66 | Temp 97.7°F | Resp 20 | Ht 67.5 in | Wt 252.0 lb

## 2011-10-07 DIAGNOSIS — I359 Nonrheumatic aortic valve disorder, unspecified: Secondary | ICD-10-CM

## 2011-10-07 DIAGNOSIS — Z954 Presence of other heart-valve replacement: Secondary | ICD-10-CM

## 2011-10-07 DIAGNOSIS — R7309 Other abnormal glucose: Secondary | ICD-10-CM

## 2011-10-07 DIAGNOSIS — Z950 Presence of cardiac pacemaker: Secondary | ICD-10-CM

## 2011-10-07 DIAGNOSIS — I2510 Atherosclerotic heart disease of native coronary artery without angina pectoris: Secondary | ICD-10-CM

## 2011-10-07 DIAGNOSIS — I350 Nonrheumatic aortic (valve) stenosis: Secondary | ICD-10-CM

## 2011-10-07 DIAGNOSIS — Z23 Encounter for immunization: Secondary | ICD-10-CM

## 2011-10-07 DIAGNOSIS — I442 Atrioventricular block, complete: Secondary | ICD-10-CM

## 2011-10-07 DIAGNOSIS — Z2911 Encounter for prophylactic immunotherapy for respiratory syncytial virus (RSV): Secondary | ICD-10-CM

## 2011-10-07 DIAGNOSIS — Z7901 Long term (current) use of anticoagulants: Secondary | ICD-10-CM

## 2011-10-07 DIAGNOSIS — Z0000 Encounter for general adult medical examination without abnormal findings: Secondary | ICD-10-CM

## 2011-10-07 DIAGNOSIS — R7302 Impaired glucose tolerance (oral): Secondary | ICD-10-CM

## 2011-10-07 LAB — POCT INR: INR: 3.2

## 2011-10-07 MED ORDER — WARFARIN SODIUM 10 MG PO TABS: 10.0000 mg | ORAL_TABLET | ORAL | Status: DC

## 2011-10-07 MED ORDER — METOPROLOL TARTRATE 50 MG PO TABS: 50.0000 mg | ORAL_TABLET | Freq: Two times a day (BID) | ORAL | Status: DC

## 2011-10-07 MED ORDER — TADALAFIL 10 MG PO TABS: 10.0000 mg | ORAL_TABLET | Freq: Every day | ORAL | Status: DC | PRN

## 2011-10-07 MED ORDER — PRAVASTATIN SODIUM 40 MG PO TABS: 40.0000 mg | ORAL_TABLET | Freq: Every evening | ORAL | Status: DC

## 2011-10-07 MED ORDER — RAMIPRIL 10 MG PO CAPS: 10.0000 mg | ORAL_CAPSULE | Freq: Every day | ORAL | Status: DC

## 2011-10-07 NOTE — Patient Instructions (Signed)
Limit your sodium (Salt) intake    It is important that you exercise regularly, at least 20 minutes 3 to 4 times per week.  If you develop chest pain or shortness of breath seek  medical attention.  You need to lose weight.  Consider a lower calorie diet and regular exercise.  Return in 6 months for follow-up   

## 2011-10-07 NOTE — Progress Notes (Signed)
Subjective:    Patient ID: [name], male    DOB: 04-Sep-1945, 66 y.o.   MRN: [id number]  HPI 66 year old patient who is seen today for a preventive health examination    CC: cpx - doing well.  History of Present Illness:   He is followed by cardiology and has a history of complete heart block status post pacemaker insertion history of coronary artery disease and aortic valve replacement. Surgery. He has done quite well. He has had a recent 2-D echocardiogram preoperatively for elective right inguinal hernia repair. His right inguinal hernia remains symptomatic. Denies any cardiotonic complaints. He remains on chronic Coumadin therapy and follow the Coumadin clinic. He is on statin therapy   Allergies:  1) ! Lopressor (Metoprolol Tartrate)  2) Codeine Phosphate (Codeine Phosphate)   Past History:  Past Medical History:  Reviewed history from 08/28/2009 and no changes required.  HYPERLIPIDEMIA (ICD-272.4)  HYPERTENSION (ICD-401.9)  CORONARY ARTERY DISEASE (ICD-414.00)  CABG... 2005  Bental procedure... August,.... 2005...( aVR in aortic root conduit)  Aortic stenosis... treated surgically  PACEMAKER (ICD-V45..01).Marland KitchenMarland KitchenAugust, 2005... Dr. Ladona Ridgel  Carotid artery disease minimal... Doppler... August 2 005  AV BLOCK, COMPLETE (ICD-426.0)  HEMORRHOIDS, INTERNAL (ICD-455.0)  ENCOUNTER FOR THERAPEUTIC DRUG MONITORING (ICD-V58.83)  SPECIAL SCREENING MALIGNANT NEOPLASM OF PROSTATE (ICD-V76.44)  DIVERTICULOSIS, COLON (ICD-562.10)  GOUT (ICD-274.9)   Past Surgical History:  Reviewed history from 11/01/2008 and no changes required.  Coronary artery bypass graft August 2005  Aortic valve repair August 2005  Permanent pacemaker - Doylene Canning. Ladona Ridgel, M.D. - 02/25/2004 - complete heart block  Circumcision  Shoulder surgery  status post PCI with stenting December 2004  colonoscopy February 2008, upper panendoscopy, February 2008   Family History:  Reviewed history from 05/18/2007 and no  changes required.  thefather at age 16 MI and also had renal cancer  mother's health unknown  one brother history type 2 diabetes  Positive for hypertension, total two brothers and 3 sisters  died age 103 (one brother)   Social History:  Reviewed history from 11/01/2008 and no changes required.  Full Time  Married  Tobacco Use - Former. 1970  Alcohol Use - no   1. Risk factors, based on past  M,S,F history-  cardiovascular risk factors include hypertension and dyslipidemia. The patient is status post CABG  2.  Physical activities: No exercise restrictions although fairly sedentary. 3.  Depression/mood: No depression or mood disorder  4.  Hearing: No significant deficits  5.  ADL's: Independent in all aspects of daily living  6.  Fall risk: Low  7.  Home safety: No problems identified  8.  Height weight, and visual acuity; height and weight stable weight is 252. No visual complaints  9.  Counseling: More regular exercise heart healthy diet and weight loss all encouraged  10. Lab orders based on risk factors: Laboratory studies reviewed. Impaired glucose tolerance unchanged slight worsening of dyslipidemia. Last issues addressed  11. Referral : Followup cardiology. Continue monthly INR monitoring  12. Care plan: Monthly INR monitoring weight loss regular exercise all recommended  13. Cognitive assessment: Alert and oriented normal affect. No cognitive dysfunction     Review of Systems  Constitutional: Negative for fever, chills, appetite change and fatigue.  HENT: Negative for hearing loss, ear pain, congestion, sore throat, trouble swallowing, neck stiffness, dental problem, voice change and tinnitus.   Eyes: Negative for pain, discharge and visual disturbance.  Respiratory: Negative for cough, chest tightness, wheezing and stridor.   Cardiovascular: Negative for  chest pain, palpitations and leg swelling.  Gastrointestinal: Negative for nausea, vomiting, abdominal pain,  diarrhea, constipation, blood in stool and abdominal distention.  Genitourinary: Negative for urgency, hematuria, flank pain, discharge, difficulty urinating and genital sores.  Musculoskeletal: Negative for myalgias, back pain, joint swelling, arthralgias and gait problem.  Skin: Negative for rash.  Neurological: Negative for dizziness, syncope, speech difficulty, weakness, numbness and headaches.  Hematological: Negative for adenopathy. Does not bruise/bleed easily.  Psychiatric/Behavioral: Negative for behavioral problems and dysphoric mood. The patient is not nervous/anxious.        Objective:   Physical Exam  Constitutional: He appears well-developed and well-nourished.       Weight 252 Blood pressure 120/80  HENT:  Head: Normocephalic and atraumatic.  Right Ear: External ear normal.  Left Ear: External ear normal.  Nose: Nose normal.  Mouth/Throat: Oropharynx is clear and moist.  Eyes: Conjunctivae and EOM are normal. Pupils are equal, round, and reactive to light. No scleral icterus.  Neck: Normal range of motion. Neck supple. No JVD present. No thyromegaly present.       Brief right carotid bruit  Cardiovascular: Normal rate, regular rhythm and intact distal pulses.  Exam reveals no gallop and no friction rub.   No murmur heard.      Prosthetic heart sounds without significant murmur  Pulmonary/Chest: Effort normal and breath sounds normal. He exhibits no tenderness.  Abdominal: Soft. Bowel sounds are normal. He exhibits no distension and no mass. There is no tenderness.  Genitourinary: Rectum normal and penis normal. Guaiac negative stool.       Prostate +2 enlarged  Musculoskeletal: Normal range of motion. He exhibits no edema and no tenderness.  Lymphadenopathy:    He has no cervical adenopathy.  Neurological: He is alert. He has normal reflexes. No cranial nerve deficit. Coordination normal.  Skin: Skin is warm and dry. No rash noted.  Psychiatric: He has a normal mood  and affect. His behavior is normal.          Assessment & Plan:   Preventive health examination Coronary artery disease status post CABG Status post aortic valve repair on chronic Coumadin anticoagulation Status post AV block status post permanent pacemaker insertion  Obesity Hypertension Dyslipidemia  Lifestyle issues addressed

## 2011-11-04 ENCOUNTER — Ambulatory Visit (INDEPENDENT_AMBULATORY_CARE_PROVIDER_SITE_OTHER): Payer: Medicare Other | Admitting: Pharmacist

## 2011-11-04 DIAGNOSIS — Z954 Presence of other heart-valve replacement: Secondary | ICD-10-CM

## 2011-11-04 DIAGNOSIS — Z7901 Long term (current) use of anticoagulants: Secondary | ICD-10-CM

## 2011-11-04 DIAGNOSIS — I359 Nonrheumatic aortic valve disorder, unspecified: Secondary | ICD-10-CM

## 2011-11-04 LAB — POCT INR: INR: 4.8

## 2011-11-08 ENCOUNTER — Encounter: Payer: Self-pay | Admitting: Internal Medicine

## 2011-11-08 DIAGNOSIS — I442 Atrioventricular block, complete: Secondary | ICD-10-CM

## 2011-11-18 ENCOUNTER — Ambulatory Visit (INDEPENDENT_AMBULATORY_CARE_PROVIDER_SITE_OTHER): Payer: Medicare Other | Admitting: *Deleted

## 2011-11-18 DIAGNOSIS — I359 Nonrheumatic aortic valve disorder, unspecified: Secondary | ICD-10-CM

## 2011-11-18 DIAGNOSIS — Z954 Presence of other heart-valve replacement: Secondary | ICD-10-CM

## 2011-11-18 DIAGNOSIS — Z7901 Long term (current) use of anticoagulants: Secondary | ICD-10-CM

## 2011-11-21 ENCOUNTER — Other Ambulatory Visit: Payer: Self-pay | Admitting: Internal Medicine

## 2011-11-29 ENCOUNTER — Other Ambulatory Visit: Payer: Self-pay | Admitting: Internal Medicine

## 2011-11-29 ENCOUNTER — Telehealth: Payer: Self-pay

## 2011-11-29 MED ORDER — RAMIPRIL 10 MG PO CAPS: 10.0000 mg | ORAL_CAPSULE | Freq: Every day | ORAL | Status: DC

## 2011-11-29 NOTE — Telephone Encounter (Signed)
Called and spoke with pt's wife and she is aware.  [name] wife states the altace needs to be called in.  [facility] but the system did not transfer to the pharmacy and no one was could be contacted.  Rx sent again.

## 2011-11-29 NOTE — Telephone Encounter (Signed)
Triage VM:  [name] wife called and states the last time pt was in to see Dr. Amador Cunas pt was given a written rx and the pt cannot tolerate this medication.  [name] wife request a call back to explain.    Called pt's wife back for clarification.  Pt was given lopressor before a surgery and it caused pt to not be able to see.  [name] wife states pt was told to never take this medication again.  [name] wife would like to know which medication pt needs to be switched back to? Pls advise.

## 2011-11-29 NOTE — Telephone Encounter (Signed)
Continue Altace Discontinue metoprolol

## 2011-12-11 ENCOUNTER — Other Ambulatory Visit: Payer: Self-pay | Admitting: Internal Medicine

## 2011-12-16 ENCOUNTER — Ambulatory Visit (INDEPENDENT_AMBULATORY_CARE_PROVIDER_SITE_OTHER): Payer: Medicare Other | Admitting: *Deleted

## 2011-12-16 DIAGNOSIS — Z954 Presence of other heart-valve replacement: Secondary | ICD-10-CM

## 2011-12-16 DIAGNOSIS — I359 Nonrheumatic aortic valve disorder, unspecified: Secondary | ICD-10-CM

## 2011-12-16 DIAGNOSIS — Z7901 Long term (current) use of anticoagulants: Secondary | ICD-10-CM

## 2012-01-12 ENCOUNTER — Ambulatory Visit (INDEPENDENT_AMBULATORY_CARE_PROVIDER_SITE_OTHER): Payer: Medicare Other | Admitting: *Deleted

## 2012-01-12 DIAGNOSIS — Z7901 Long term (current) use of anticoagulants: Secondary | ICD-10-CM

## 2012-01-12 DIAGNOSIS — Z954 Presence of other heart-valve replacement: Secondary | ICD-10-CM

## 2012-01-12 DIAGNOSIS — I359 Nonrheumatic aortic valve disorder, unspecified: Secondary | ICD-10-CM

## 2012-01-12 LAB — POCT INR: INR: 2.5

## 2012-02-07 DIAGNOSIS — I442 Atrioventricular block, complete: Secondary | ICD-10-CM

## 2012-02-23 ENCOUNTER — Ambulatory Visit (INDEPENDENT_AMBULATORY_CARE_PROVIDER_SITE_OTHER): Payer: Medicare Other | Admitting: *Deleted

## 2012-02-23 DIAGNOSIS — I359 Nonrheumatic aortic valve disorder, unspecified: Secondary | ICD-10-CM

## 2012-02-23 DIAGNOSIS — Z954 Presence of other heart-valve replacement: Secondary | ICD-10-CM

## 2012-02-23 DIAGNOSIS — Z7901 Long term (current) use of anticoagulants: Secondary | ICD-10-CM

## 2012-02-23 LAB — POCT INR: INR: 1.7

## 2012-03-15 ENCOUNTER — Ambulatory Visit (INDEPENDENT_AMBULATORY_CARE_PROVIDER_SITE_OTHER): Payer: Medicare Other | Admitting: *Deleted

## 2012-03-15 DIAGNOSIS — Z7901 Long term (current) use of anticoagulants: Secondary | ICD-10-CM

## 2012-03-15 DIAGNOSIS — Z954 Presence of other heart-valve replacement: Secondary | ICD-10-CM

## 2012-03-15 DIAGNOSIS — I359 Nonrheumatic aortic valve disorder, unspecified: Secondary | ICD-10-CM

## 2012-03-19 ENCOUNTER — Encounter: Payer: Self-pay | Admitting: Cardiology

## 2012-03-21 ENCOUNTER — Encounter: Payer: Self-pay | Admitting: Cardiology

## 2012-03-21 ENCOUNTER — Ambulatory Visit (INDEPENDENT_AMBULATORY_CARE_PROVIDER_SITE_OTHER): Payer: Medicare Other | Admitting: Cardiology

## 2012-03-21 VITALS — BP 150/89 | HR 60 | Resp 18 | Ht 67.0 in | Wt 237.8 lb

## 2012-03-21 DIAGNOSIS — Z950 Presence of cardiac pacemaker: Secondary | ICD-10-CM

## 2012-03-21 DIAGNOSIS — Z7901 Long term (current) use of anticoagulants: Secondary | ICD-10-CM

## 2012-03-21 DIAGNOSIS — R42 Dizziness and giddiness: Secondary | ICD-10-CM

## 2012-03-21 DIAGNOSIS — I10 Essential (primary) hypertension: Secondary | ICD-10-CM

## 2012-03-21 DIAGNOSIS — I2510 Atherosclerotic heart disease of native coronary artery without angina pectoris: Secondary | ICD-10-CM

## 2012-03-21 DIAGNOSIS — Z954 Presence of other heart-valve replacement: Secondary | ICD-10-CM

## 2012-03-21 DIAGNOSIS — I779 Disorder of arteries and arterioles, unspecified: Secondary | ICD-10-CM

## 2012-03-21 NOTE — Assessment & Plan Note (Signed)
The patient's last carotid Doppler was 2005. He had minimal disease at that time. He has known coronary artery disease. This time for followup carotid Doppler and this will be scheduled.

## 2012-03-21 NOTE — Assessment & Plan Note (Signed)
Patient will continue on Coumadin indefinitely.

## 2012-03-21 NOTE — Assessment & Plan Note (Signed)
The patient does have known coronary disease. He did undergo CABG at the time of his aortic valve replacement in 2005. He is not having any significant symptoms. I've discussed with him the fact that we will consider doing an exercise test next year.

## 2012-03-21 NOTE — Assessment & Plan Note (Signed)
The patient received an aortic root conduit with mechanical aortic valve replacement in 2005. His echo in March, 2011 show that the valve is working quite well. He continues on full dose anticoagulation. He's doing very well. No further workup is needed at this time.

## 2012-03-21 NOTE — Assessment & Plan Note (Signed)
He is not having any recurrent dizziness. 

## 2012-03-21 NOTE — Patient Instructions (Addendum)
Your physician has requested that you have a carotid duplex. This test is an ultrasound of the carotid arteries in your neck. It looks at blood flow through these arteries that supply the brain with blood. Allow one hour for this exam. There are no restrictions or special instructions.   Your physician recommends that you continue on your current medications as directed. Please refer to the Current Medication list given to you today.   Your physician wants you to follow-up in: 1 year. You will receive a reminder letter in the mail two months in advance. If you don't receive a letter, please call our office to schedule the follow-up appointment.  Reminder: Please have your primary doctor check your lipids at your next visit with them

## 2012-03-21 NOTE — Progress Notes (Signed)
HPI  Patient is seen today to followup coronary artery disease and aortic valve replacement. [name] is doing very well. [name] underwent CABG and aVR in 2005. [name] also required a pacemaker. [name] is followed by Dr. Ladona Ridgel. [name] is fully active. [name] is overweight but is actively losing some weight. [name] has been having some knee problems.  Allergies  Allergen Reactions  . Codeine Phosphate     REACTION: difficulty breathing  . Metoprolol Tartrate     REACTION: loss of vision    Current Outpatient Prescriptions  Medication Sig Dispense Refill  . Ascorbic Acid (VITAMIN C) 500 MG tablet Take 500 mg by mouth daily.        Marland Kitchen aspirin 81 MG tablet Take 81 mg by mouth daily.        . Calcium Carbonate (CALCIUM 500 PO) Take by mouth daily.        . fexofenadine (ALLEGRA) 180 MG tablet Take 180 mg by mouth daily.      . furosemide (LASIX) 40 MG tablet TAKE ONE TABLET BY MOUTH TWICE DAILY  180 tablet  1  . Glucosamine-Chondroitin 250-200 MG CAPS Take by mouth daily.        Marland Kitchen KLOR-CON M10 10 MEQ tablet TAKE ONE TABLET BY MOUTH TWICE DAILY  180 each  1  . Meclizine HCl (BONINE) 25 MG CHEW Chew 1 tablet by mouth daily.        . metoprolol (LOPRESSOR) 50 MG tablet Take 1 tablet (50 mg total) by mouth 2 (two) times daily.  180 tablet  3  . metoprolol succinate (TOPROL-XL) 100 MG 24 hr tablet TAKE ONE TABLET BY MOUTH EVERY DAY  90 tablet  3  . Multiple Vitamin (MULTIVITAMIN) tablet Take 1 tablet by mouth daily.        . nitroGLYCERIN (NITROSTAT) 0.4 MG SL tablet Place 0.4 mg under the tongue every 5 (five) minutes as needed.        . Omega-3 Fatty Acids (FISH OIL) 1000 MG CAPS Take 1,000 mg by mouth 2 (two) times daily.       . pravastatin (PRAVACHOL) 40 MG tablet Take 1 tablet (40 mg total) by mouth every evening.  90 tablet  1  . ramipril (ALTACE) 10 MG capsule Take 1 capsule (10 mg total) by mouth daily.  90 capsule  6  . tadalafil (CIALIS) 10 MG tablet Take 1 tablet (10 mg total) by mouth daily as needed.  10  tablet  6  . vitamin B-12 (CYANOCOBALAMIN) 100 MCG tablet Take 100 mcg by mouth daily.        Marland Kitchen warfarin (COUMADIN) 10 MG tablet Take 1 tablet (10 mg total) by mouth as directed.  90 tablet  3  . DISCONTD: potassium chloride (KLOR-CON 10) 10 MEQ CR tablet Take 1 tablet (10 mEq total) by mouth 2 (two) times daily.  180 tablet  6    History   Social History  . Marital Status: Married    Spouse Name: N/A    Number of Children: N/A  . Years of Education: N/A   Occupational History  . Not on file.   Social History Main Topics  . Smoking status: Former Smoker    Quit date: 07/12/1973  . Smokeless tobacco: Never Used  . Alcohol Use: No  . Drug Use: Not on file  . Sexually Active: Not on file   Other Topics Concern  . Not on file   Social History Narrative  . No narrative on  file    Family History  Problem Relation Age of Onset  . Heart attack Father   . Cancer Father     renal cancer  . Diabetes Brother     Past Medical History  Diagnosis Date  . Aortic stenosis 08/25/2010    AVR  2005  . AORTIC VALVE REPLACEMENT, HX OF 05/23/2009    2005   Bental with aortic root conduit and mechanical AVR  . AV BLOCK, COMPLETE 11/01/2008    PTVDP  02/2004  . CAD (coronary artery disease) 01/16/2007    CABG  2005  . DIVERTICULOSIS, COLON 10/17/2007  . DIZZINESS 06/19/2009  . GOUT 01/16/2007  . HEMORRHOIDS, INTERNAL 11/01/2008  . HYPERLIPIDEMIA 01/16/2007  . HYPERTENSION 01/16/2007  . LEG EDEMA 11/27/2008  . PACEMAKER, PERMANENT 11/01/2008    Dr Ladona Ridgel 02/2004  . Impaired glucose tolerance   . Carotid artery disease     minimal, doppler 2005  . Special screening for malignant neoplasm of prostate   . Warfarin anticoagulation     AVR  . Ejection fraction     50-55%, echo, 09/2009, inferior/posterior,apical hypo  . Hx of CABG     2005    Past Surgical History  Procedure Date  . Coronary artery bypass graft   . Aortic valve replacement   . Pacemaker insertion   . Shoulder surgery   .  Cardiac catheterization 06/2003  . Circumcision   . Colonoscopy     ROS    Patient denies fever, chills, headache, sweats, rash, change in vision, change in hearing, chest pain, cough, nausea or vomiting, urinary symptoms. All other systems are reviewed and are negative.   PHYSICAL EXAM   Patient is oriented to person time and place. Affect is normal. Lungs are clear. Respiratory effort is nonlabored. Cardiac exam reveals S1 and S2. The aortic prosthetic closure sound is very crisp. No aortic insufficiency is heard. Abdomen is soft. There is no peripheral edema. There no musculoskeletal deformities. There are no skin rashes.  Filed Vitals:   03/21/12 1030  BP: 150/89  Pulse: 60  Resp: 18  Height: 5\' 7"  (1.702 m)  Weight: 237 lb 12.8 oz (107.865 kg)  SpO2: 93%   EKG is done today and reviewed by me. The rhythm is paced.  ASSESSMENT & PLAN

## 2012-03-21 NOTE — Assessment & Plan Note (Signed)
His pacemaker is followed carefully by Dr. Ladona Ridgel.

## 2012-03-27 ENCOUNTER — Encounter (INDEPENDENT_AMBULATORY_CARE_PROVIDER_SITE_OTHER): Payer: Medicare Other

## 2012-03-27 DIAGNOSIS — I6529 Occlusion and stenosis of unspecified carotid artery: Secondary | ICD-10-CM

## 2012-04-01 ENCOUNTER — Encounter: Payer: Self-pay | Admitting: Cardiology

## 2012-04-03 ENCOUNTER — Ambulatory Visit (INDEPENDENT_AMBULATORY_CARE_PROVIDER_SITE_OTHER): Payer: Medicare Other | Admitting: *Deleted

## 2012-04-03 DIAGNOSIS — I359 Nonrheumatic aortic valve disorder, unspecified: Secondary | ICD-10-CM

## 2012-04-03 DIAGNOSIS — Z7901 Long term (current) use of anticoagulants: Secondary | ICD-10-CM

## 2012-04-03 DIAGNOSIS — Z954 Presence of other heart-valve replacement: Secondary | ICD-10-CM

## 2012-04-03 LAB — POCT INR: INR: 3

## 2012-04-10 ENCOUNTER — Encounter: Payer: Self-pay | Admitting: Internal Medicine

## 2012-04-10 ENCOUNTER — Ambulatory Visit (INDEPENDENT_AMBULATORY_CARE_PROVIDER_SITE_OTHER): Payer: Medicare Other | Admitting: Internal Medicine

## 2012-04-10 VITALS — BP 130/80 | Temp 98.0°F | Wt 234.0 lb

## 2012-04-10 DIAGNOSIS — E785 Hyperlipidemia, unspecified: Secondary | ICD-10-CM

## 2012-04-10 DIAGNOSIS — Z7901 Long term (current) use of anticoagulants: Secondary | ICD-10-CM

## 2012-04-10 DIAGNOSIS — Z23 Encounter for immunization: Secondary | ICD-10-CM

## 2012-04-10 DIAGNOSIS — I2510 Atherosclerotic heart disease of native coronary artery without angina pectoris: Secondary | ICD-10-CM

## 2012-04-10 DIAGNOSIS — I10 Essential (primary) hypertension: Secondary | ICD-10-CM

## 2012-04-10 DIAGNOSIS — I442 Atrioventricular block, complete: Secondary | ICD-10-CM

## 2012-04-10 DIAGNOSIS — Z954 Presence of other heart-valve replacement: Secondary | ICD-10-CM

## 2012-04-10 MED ORDER — FUROSEMIDE 40 MG PO TABS: 40.0000 mg | ORAL_TABLET | Freq: Every day | ORAL | Status: DC

## 2012-04-10 MED ORDER — PRAVASTATIN SODIUM 40 MG PO TABS: 40.0000 mg | ORAL_TABLET | Freq: Every evening | ORAL | Status: DC

## 2012-04-10 MED ORDER — METOPROLOL TARTRATE 50 MG PO TABS: 50.0000 mg | ORAL_TABLET | Freq: Two times a day (BID) | ORAL | Status: DC

## 2012-04-10 MED ORDER — POTASSIUM CHLORIDE CRYS ER 10 MEQ PO TBCR: 10.0000 meq | EXTENDED_RELEASE_TABLET | Freq: Every day | ORAL | Status: DC

## 2012-04-10 MED ORDER — RAMIPRIL 10 MG PO CAPS: 10.0000 mg | ORAL_CAPSULE | Freq: Every day | ORAL | Status: DC

## 2012-04-10 MED ORDER — TADALAFIL 10 MG PO TABS: 10.0000 mg | ORAL_TABLET | Freq: Every day | ORAL | Status: DC | PRN

## 2012-04-10 MED ORDER — WARFARIN SODIUM 10 MG PO TABS: 10.0000 mg | ORAL_TABLET | ORAL | Status: DC

## 2012-04-10 NOTE — Patient Instructions (Addendum)
Limit your sodium (Salt) intake    It is important that you exercise regularly, at least 20 minutes 3 to 4 times per week.  If you develop chest pain or shortness of breath seek  medical attention.  You need to lose weight.  Consider a lower calorie diet and regular exercise.  Return in 6 months for follow-up   

## 2012-04-10 NOTE — Progress Notes (Signed)
  Subjective:    Patient ID: [name], male    DOB: Jan 16, 1946, 66 y.o.   MRN: [id number]  HPI  Wt Readings from Last 3 Encounters:  04/10/12 234 lb (106.142 kg)  03/21/12 237 lb 12.8 oz (107.865 kg)  10/07/11 252 lb (114.306 kg)    BP Readings from Last 3 Encounters:  04/10/12 130/80  03/21/12 150/89  10/07/11 120/84    Review of Systems     Objective:   Physical Exam        Assessment & Plan:   Hypertension controlled Dyslipidemia. Check lipid panel in 6 months CAD. Remains asymptomatic Status post aortic valve repair stable Chronic Coumadin anticoagulation  CPX 6 months

## 2012-04-10 NOTE — Progress Notes (Signed)
Subjective:    Patient ID: [name], male    DOB: 01-Jan-1946, 66 y.o.   MRN: [id number]  HPI  66 year old patient who is seen today for his six-month followup.  He has a history of coronary artery disease status post CABG. He remains on chronic Coumadin anticoagulation status post aVR. He is also status post permanent pacemaker insertion for complete heart block. His cardiac status has been stable. He denies any exertional symptoms although somewhat limited due 2 arthritis involving the knees. He has been seen by cardiology recently who ordered a carotid duplex that revealed very mild stable disease. A followup examination in 2 years recommended History of hypertension which has been well-controlled he has dyslipidemia and remains on Pravachol. He has a history of minimal lower extremity edema which has been managed with furosemide. Over the past 6 months he has lost almost 20 pounds in weight. His wife has been following a Weight Wat[employer] diet and he is following  portion control.  Past Medical History  Diagnosis Date  . Aortic stenosis 08/25/2010    AVR  2005  . AORTIC VALVE REPLACEMENT, HX OF 05/23/2009    2005   Bental with aortic root conduit and mechanical AVR  . AV BLOCK, COMPLETE 11/01/2008    PTVDP  02/2004  . CAD (coronary artery disease) 01/16/2007    CABG  2005  . DIVERTICULOSIS, COLON 10/17/2007  . DIZZINESS 06/19/2009  . GOUT 01/16/2007  . HEMORRHOIDS, INTERNAL 11/01/2008  . HYPERLIPIDEMIA 01/16/2007  . HYPERTENSION 01/16/2007  . LEG EDEMA 11/27/2008  . PACEMAKER, PERMANENT 11/01/2008    Dr Ladona Ridgel 02/2004  . Impaired glucose tolerance   . Carotid artery disease     minimal, doppler 2005  . Special screening for malignant neoplasm of prostate   . Warfarin anticoagulation     AVR  . Ejection fraction     50-55%, echo, 09/2009, inferior/posterior,apical hypo  . Hx of CABG     2005    History   Social History  . Marital Status: Married    Spouse Name: N/A    Number of  Children: N/A  . Years of Education: N/A   Occupational History  . Not on file.   Social History Main Topics  . Smoking status: Former Smoker    Quit date: 07/12/1973  . Smokeless tobacco: Never Used  . Alcohol Use: No  . Drug Use: Not on file  . Sexually Active: Not on file   Other Topics Concern  . Not on file   Social History Narrative  . No narrative on file    Past Surgical History  Procedure Date  . Coronary artery bypass graft   . Aortic valve replacement   . Pacemaker insertion   . Shoulder surgery   . Cardiac catheterization 06/2003  . Circumcision   . Colonoscopy     Family History  Problem Relation Age of Onset  . Heart attack Father   . Cancer Father     renal cancer  . Diabetes Brother     Allergies  Allergen Reactions  . Codeine Phosphate     REACTION: difficulty breathing  . Metoprolol Tartrate     REACTION: loss of vision    Current Outpatient Prescriptions on File Prior to Visit  Medication Sig Dispense Refill  . Ascorbic Acid (VITAMIN C) 500 MG tablet Take 500 mg by mouth daily.        Marland Kitchen aspirin 81 MG tablet Take 81 mg by mouth daily.        Marland Kitchen  Calcium Carbonate (CALCIUM 500 PO) Take by mouth daily.        . fexofenadine (ALLEGRA) 180 MG tablet Take 180 mg by mouth daily.      . furosemide (LASIX) 40 MG tablet TAKE ONE TABLET BY MOUTH TWICE DAILY  180 tablet  1  . Glucosamine-Chondroitin 250-200 MG CAPS Take by mouth daily.        Marland Kitchen KLOR-CON M10 10 MEQ tablet TAKE ONE TABLET BY MOUTH TWICE DAILY  180 each  1  . Meclizine HCl (BONINE) 25 MG CHEW Chew 1 tablet by mouth daily.        . metoprolol (LOPRESSOR) 50 MG tablet Take 1 tablet (50 mg total) by mouth 2 (two) times daily.  180 tablet  3  . metoprolol succinate (TOPROL-XL) 100 MG 24 hr tablet TAKE ONE TABLET BY MOUTH EVERY DAY  90 tablet  3  . Multiple Vitamin (MULTIVITAMIN) tablet Take 1 tablet by mouth daily.        . nitroGLYCERIN (NITROSTAT) 0.4 MG SL tablet Place 0.4 mg under the  tongue every 5 (five) minutes as needed.        . Omega-3 Fatty Acids (FISH OIL) 1000 MG CAPS Take 1,000 mg by mouth 2 (two) times daily.       . pravastatin (PRAVACHOL) 40 MG tablet Take 1 tablet (40 mg total) by mouth every evening.  90 tablet  1  . ramipril (ALTACE) 10 MG capsule Take 1 capsule (10 mg total) by mouth daily.  90 capsule  6  . tadalafil (CIALIS) 10 MG tablet Take 1 tablet (10 mg total) by mouth daily as needed.  10 tablet  6  . vitamin B-12 (CYANOCOBALAMIN) 100 MCG tablet Take 100 mcg by mouth daily.        Marland Kitchen warfarin (COUMADIN) 10 MG tablet Take 1 tablet (10 mg total) by mouth as directed.  90 tablet  3  . DISCONTD: potassium chloride (KLOR-CON 10) 10 MEQ CR tablet Take 1 tablet (10 mEq total) by mouth 2 (two) times daily.  180 tablet  6    BP 130/80  Temp 98 F (36.7 C) (Oral)  Wt 234 lb (106.142 kg)       Review of Systems  Constitutional: Negative for fever, chills, appetite change and fatigue.  HENT: Negative for hearing loss, ear pain, congestion, sore throat, trouble swallowing, neck stiffness, dental problem, voice change and tinnitus.   Eyes: Negative for pain, discharge and visual disturbance.  Respiratory: Negative for cough, chest tightness, wheezing and stridor.   Cardiovascular: Negative for chest pain, palpitations and leg swelling.  Gastrointestinal: Negative for nausea, vomiting, abdominal pain, diarrhea, constipation, blood in stool and abdominal distention.  Genitourinary: Negative for urgency, hematuria, flank pain, discharge, difficulty urinating and genital sores.  Musculoskeletal: Positive for arthralgias (knee pain) and gait problem. Negative for myalgias, back pain and joint swelling.  Skin: Negative for rash.  Neurological: Positive for headaches (this a.m. only). Negative for dizziness, syncope, speech difficulty, weakness and numbness.  Hematological: Negative for adenopathy. Does not bruise/bleed easily.  Psychiatric/Behavioral: Negative  for behavioral problems and dysphoric mood. The patient is not nervous/anxious.        Objective:   Physical Exam  Constitutional: He is oriented to person, place, and time. He appears well-developed.  HENT:  Head: Normocephalic.  Right Ear: External ear normal.  Left Ear: External ear normal.  Eyes: Conjunctivae normal and EOM are normal.  Neck: Normal range of motion.  Cardiovascular: Normal rate  and regular rhythm.        Prosthetic heart sounds. No murmur  Pulmonary/Chest: Breath sounds normal.  Abdominal: Bowel sounds are normal.  Musculoskeletal: Normal range of motion. He exhibits no edema and no tenderness.       Trace ankle edema  Neurological: He is alert and oriented to person, place, and time.  Psychiatric: He has a normal mood and affect. His behavior is normal.          Assessment & Plan:   Hypertension well controlled. Repeat blood pressure 130/70 Dyslipidemia. We'll continue Pravachol. We'll check a lipid profile in 6 months at that time his annual exam Coronary artery disease stable Obesity improved Chronic Coumadin anticoagulation. We'll continue monthly INRs   All medications refilled CPX 6 months Flu shot administered

## 2012-05-01 ENCOUNTER — Ambulatory Visit (INDEPENDENT_AMBULATORY_CARE_PROVIDER_SITE_OTHER): Payer: Medicare Other

## 2012-05-01 DIAGNOSIS — Z7901 Long term (current) use of anticoagulants: Secondary | ICD-10-CM

## 2012-05-01 DIAGNOSIS — I359 Nonrheumatic aortic valve disorder, unspecified: Secondary | ICD-10-CM

## 2012-05-01 DIAGNOSIS — Z954 Presence of other heart-valve replacement: Secondary | ICD-10-CM

## 2012-05-01 LAB — POCT INR: INR: 2.7

## 2012-05-08 DIAGNOSIS — I442 Atrioventricular block, complete: Secondary | ICD-10-CM

## 2012-05-21 ENCOUNTER — Other Ambulatory Visit: Payer: Self-pay | Admitting: Internal Medicine

## 2012-05-22 NOTE — Telephone Encounter (Signed)
Med filled.  

## 2012-05-29 ENCOUNTER — Ambulatory Visit (INDEPENDENT_AMBULATORY_CARE_PROVIDER_SITE_OTHER): Payer: Medicare Other | Admitting: *Deleted

## 2012-05-29 DIAGNOSIS — Z7901 Long term (current) use of anticoagulants: Secondary | ICD-10-CM

## 2012-05-29 DIAGNOSIS — I359 Nonrheumatic aortic valve disorder, unspecified: Secondary | ICD-10-CM

## 2012-05-29 DIAGNOSIS — Z954 Presence of other heart-valve replacement: Secondary | ICD-10-CM

## 2012-06-21 ENCOUNTER — Other Ambulatory Visit: Payer: Medicare Other

## 2012-06-26 ENCOUNTER — Ambulatory Visit (INDEPENDENT_AMBULATORY_CARE_PROVIDER_SITE_OTHER): Payer: Medicare Other | Admitting: *Deleted

## 2012-06-26 DIAGNOSIS — Z7901 Long term (current) use of anticoagulants: Secondary | ICD-10-CM

## 2012-06-26 DIAGNOSIS — Z954 Presence of other heart-valve replacement: Secondary | ICD-10-CM

## 2012-06-26 DIAGNOSIS — I359 Nonrheumatic aortic valve disorder, unspecified: Secondary | ICD-10-CM

## 2012-06-26 LAB — POCT INR: INR: 2.7

## 2012-07-16 ENCOUNTER — Encounter (HOSPITAL_COMMUNITY): Payer: Self-pay | Admitting: Adult Health

## 2012-07-16 ENCOUNTER — Emergency Department (HOSPITAL_COMMUNITY)
Admission: EM | Admit: 2012-07-16 | Discharge: 2012-07-16 | Disposition: A | Discharge status: Home / Self Care | Payer: Medicare Other | Attending: Emergency Medicine | Admitting: Emergency Medicine

## 2012-07-16 DIAGNOSIS — Z8679 Personal history of other diseases of the circulatory system: Secondary | ICD-10-CM | POA: Insufficient documentation

## 2012-07-16 DIAGNOSIS — Z7901 Long term (current) use of anticoagulants: Secondary | ICD-10-CM | POA: Insufficient documentation

## 2012-07-16 DIAGNOSIS — Z8719 Personal history of other diseases of the digestive system: Secondary | ICD-10-CM | POA: Insufficient documentation

## 2012-07-16 DIAGNOSIS — S81019A Laceration without foreign body, unspecified knee, initial encounter: Secondary | ICD-10-CM

## 2012-07-16 DIAGNOSIS — Y929 Unspecified place or not applicable: Secondary | ICD-10-CM | POA: Insufficient documentation

## 2012-07-16 DIAGNOSIS — Z8639 Personal history of other endocrine, nutritional and metabolic disease: Secondary | ICD-10-CM | POA: Insufficient documentation

## 2012-07-16 DIAGNOSIS — Z23 Encounter for immunization: Secondary | ICD-10-CM | POA: Insufficient documentation

## 2012-07-16 DIAGNOSIS — Z862 Personal history of diseases of the blood and blood-forming organs and certain disorders involving the immune mechanism: Secondary | ICD-10-CM | POA: Insufficient documentation

## 2012-07-16 DIAGNOSIS — E785 Hyperlipidemia, unspecified: Secondary | ICD-10-CM | POA: Insufficient documentation

## 2012-07-16 DIAGNOSIS — Z79899 Other long term (current) drug therapy: Secondary | ICD-10-CM | POA: Insufficient documentation

## 2012-07-16 DIAGNOSIS — Z87891 Personal history of nicotine dependence: Secondary | ICD-10-CM | POA: Insufficient documentation

## 2012-07-16 DIAGNOSIS — Z950 Presence of cardiac pacemaker: Secondary | ICD-10-CM | POA: Insufficient documentation

## 2012-07-16 DIAGNOSIS — S81009A Unspecified open wound, unspecified knee, initial encounter: Secondary | ICD-10-CM | POA: Insufficient documentation

## 2012-07-16 DIAGNOSIS — W19XXXA Unspecified fall, initial encounter: Secondary | ICD-10-CM

## 2012-07-16 DIAGNOSIS — Z951 Presence of aortocoronary bypass graft: Secondary | ICD-10-CM | POA: Insufficient documentation

## 2012-07-16 DIAGNOSIS — Z954 Presence of other heart-valve replacement: Secondary | ICD-10-CM | POA: Insufficient documentation

## 2012-07-16 DIAGNOSIS — W010XXA Fall on same level from slipping, tripping and stumbling without subsequent striking against object, initial encounter: Secondary | ICD-10-CM | POA: Insufficient documentation

## 2012-07-16 DIAGNOSIS — I2510 Atherosclerotic heart disease of native coronary artery without angina pectoris: Secondary | ICD-10-CM | POA: Insufficient documentation

## 2012-07-16 DIAGNOSIS — I10 Essential (primary) hypertension: Secondary | ICD-10-CM | POA: Insufficient documentation

## 2012-07-16 DIAGNOSIS — Y9301 Activity, walking, marching and hiking: Secondary | ICD-10-CM | POA: Insufficient documentation

## 2012-07-16 MED ORDER — TETANUS-DIPHTH-ACELL PERTUSSIS 5-2.5-18.5 LF-MCG/0.5 IM SUSP
0.5000 mL | Freq: Once | INTRAMUSCULAR | Status: AC
  Administered 2012-07-16: 0.5 mL via INTRAMUSCULAR

## 2012-07-16 MED ORDER — TETANUS-DIPHTH-ACELL PERTUSSIS 5-2.5-18.5 LF-MCG/0.5 IM SUSP
INTRAMUSCULAR | Status: AC
  Filled 2012-07-16: qty 0.5

## 2012-07-16 NOTE — ED Notes (Signed)
Pt reports tripping over a floor register and landing on register with left knee. Pt has a laceration approximately 2inch in length with little bleeding to the site. Pt reports pain 2/10. Pt has good popliteal and pedal pulses on left knee.

## 2012-07-16 NOTE — ED Provider Notes (Signed)
History   This chart was scribed for Jones Skene, MD by Melba Coon, ED Scribe. The patient was seen in room TR08C/TR08C and the patient's care was started at 7:23PM.    CSN: [id number]  Arrival date & time 07/16/12  1742   First MD Initiated Contact with Patient 07/16/12 1918      Chief Complaint  Patient presents with  . Laceration    (Consider location/radiation/quality/duration/timing/severity/associated sxs/prior treatment) The history is provided by the patient. No language interpreter was used.   [name] is a 67 y.o. male who presents to the Emergency Department complaining of constant, mild to moderate left knee pain pertaining to a laceration from a fall with no head contact or LOC with an onset about 2 hours ago. He reports tripping and falling on a sharp surface. He rates the severity of the pain a 2/10 at the ED but was worse at the time of the fall. Denies HA, fever, neck pain, sore throat, rash, back pain, CP, SOB, abdominal pain, nausea, emesis, diarrhea, dysuria, or extremity weakness, numbness, or tingling. He is on coumadin for artificial valve; last INR check was 3 weeks ago. Last tetanus shot was unknown. No known allergies. No other pertinent medical symptoms.  Past Medical History  Diagnosis Date  . Aortic stenosis 08/25/2010    AVR  2005  . AORTIC VALVE REPLACEMENT, HX OF 05/23/2009    2005   Bental with aortic root conduit and mechanical AVR  . AV BLOCK, COMPLETE 11/01/2008    PTVDP  02/2004  . CAD (coronary artery disease) 01/16/2007    CABG  2005  . DIVERTICULOSIS, COLON 10/17/2007  . DIZZINESS 06/19/2009  . GOUT 01/16/2007  . HEMORRHOIDS, INTERNAL 11/01/2008  . HYPERLIPIDEMIA 01/16/2007  . HYPERTENSION 01/16/2007  . LEG EDEMA 11/27/2008  . PACEMAKER, PERMANENT 11/01/2008    Dr Ladona Ridgel 02/2004  . Impaired glucose tolerance   . Carotid artery disease     minimal, doppler 2005  . Special screening for malignant neoplasm of prostate   . Warfarin  anticoagulation     AVR  . Ejection fraction     50-55%, echo, 09/2009, inferior/posterior,apical hypo  . Hx of CABG     2005    Past Surgical History  Procedure Date  . Coronary artery bypass graft   . Aortic valve replacement   . Pacemaker insertion   . Shoulder surgery   . Cardiac catheterization 06/2003  . Circumcision   . Colonoscopy     Family History  Problem Relation Age of Onset  . Heart attack Father   . Cancer Father     renal cancer  . Diabetes Brother     History  Substance Use Topics  . Smoking status: Former Smoker    Quit date: 07/12/1973  . Smokeless tobacco: Never Used  . Alcohol Use: No      Review of Systems 10 Systems reviewed and all are negative for acute change except as noted in the HPI.   Allergies  Codeine phosphate and Metoprolol tartrate  Home Medications   Current Outpatient Rx  Name  Route  Sig  Dispense  Refill  . VITAMIN C 500 MG PO TABS   Oral   Take 500 mg by mouth daily.           . ASPIRIN 81 MG PO TABS   Oral   Take 81 mg by mouth daily.           Marland Kitchen CALCIUM 500 PO  Oral   Take 1 tablet by mouth daily.          Marland Kitchen FEXOFENADINE HCL 180 MG PO TABS   Oral   Take 180 mg by mouth daily.         . FUROSEMIDE 40 MG PO TABS      TAKE ONE TABLET BY MOUTH TWICE DAILY   180 tablet   0   . GLUCOSAMINE-CHONDROITIN 250-200 MG PO CAPS   Oral   Take 1 capsule by mouth daily.          . MECLIZINE HCL 25 MG PO CHEW   Oral   Chew 1 tablet by mouth daily.           Marland Kitchen METOPROLOL SUCCINATE ER 100 MG PO TB24      TAKE ONE TABLET BY MOUTH EVERY DAY   90 tablet   3   . ONE-DAILY MULTI VITAMINS PO TABS   Oral   Take 1 tablet by mouth daily.           Marland Kitchen NITROGLYCERIN 0.4 MG SL SUBL   Sublingual   Place 0.4 mg under the tongue every 5 (five) minutes as needed. For chest pain         . FISH OIL 1000 MG PO CAPS   Oral   Take 1,000 mg by mouth 2 (two) times daily.          Marland Kitchen POTASSIUM CHLORIDE CRYS  ER 10 MEQ PO TBCR   Oral   Take 1 tablet (10 mEq total) by mouth daily.   90 tablet   6   . PRAVASTATIN SODIUM 40 MG PO TABS   Oral   Take 1 tablet (40 mg total) by mouth every evening.   90 tablet   1   . RAMIPRIL 10 MG PO CAPS   Oral   Take 1 capsule (10 mg total) by mouth daily.   90 capsule   6   . TADALAFIL 10 MG PO TABS   Oral   Take 1 tablet (10 mg total) by mouth daily as needed.   10 tablet   6   . VITAMIN B12 100 MCG PO TABS   Oral   Take 100 mcg by mouth daily.           . WARFARIN SODIUM 10 MG PO TABS   Oral   Take 5-10 mg by mouth daily. Take 10 mg everyday except Monday take 5 mg           BP 170/92  Pulse 65  Temp 97.6 F (36.4 C) (Oral)  Resp 16  SpO2 97%  Physical Exam  Nursing notes reviewed.  Electronic medical record reviewed. VITAL SIGNS:   Filed Vitals:   07/16/12 1748 07/16/12 1754  BP: 170/92 170/92  Pulse: 65 65  Temp: 97.6 F (36.4 C) 97.6 F (36.4 C)  TempSrc: Oral Oral  Resp: 18 16  SpO2: 97% 97%   CONSTITUTIONAL: Awake, oriented, appears non-toxic HENT: Atraumatic, normocephalic, oral mucosa pink and moist, airway patent. Nares patent without drainage. External ears normal. EYES: Conjunctiva clear, EOMI, PERRLA NECK: Trachea midline, non-tender, supple CARDIOVASCULAR: Normal heart rate, Normal rhythm. No rubs, gallops auscultated. Audible and palpable 6/6 click, loudest at aortic region from [facility] Valve. PULMONARY/CHEST: Clear to auscultation, no rhonchi, wheezes, or rales. Symmetrical breath sounds. Non-tender. ABDOMINAL: Non-distended, obese, soft, non-tender - no rebound or guarding.  BS normal. NEUROLOGIC: Non-focal, moving all four extremities, no gross sensory or motor  deficits. EXTREMITIES: No clubbing, cyanosis, or edema. 3.5cm laceration on left knee.  Knee non-tender. Bleeding hemostatic. SKIN: Warm, Dry, No erythema, No rash  ED Course  Procedures (including critical care time)  COORDINATION OF  CARE:  7:27PM - tetanus shot will be ordered for [name]. Laceration repair will be performed.  LACERATION REPAIR Performed by: Jones Skene, MD Consent: Verbal consent obtained. Risks and benefits: risks, benefits and alternatives were discussed Patient identity confirmed: provided demographic data Time out performed prior to procedure Prepped and Draped in normal sterile fashion Wound explored Laceration Location: left knee Laceration Length: 3.5cm No Foreign Bodies seen or palpated Anesthesia: local infiltration Local anesthetic: lidocaine 2% w/ epinephrine Anesthetic total: 5 ml Irrigation method: syringe, saline under pressure using splash shield Amount of cleaning: standard Skin closure: close Number of sutures or staples: 7, 3-0 Ethilon Technique: interrupted Patient tolerance: Patient tolerated the procedure well with no immediate complications.   Labs Reviewed - No data to display No results found.   1. Knee laceration   2. Fall     3-1/2 cm laceration left knee 7 stitches, interrupted 3-0 Ethilon  MDM  [name] is a 67 y.o. male presents with fall and laceration to left knee.  No knee pain.  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  No indication to XR, d/w pt who agrees.  Laceration repaired.  I explained the diagnosis and have given explicit precautions to return to the ER including signs of infection or any other new or worsening symptoms. The patient understands and accepts the medical plan as it's been dictated and I have answered their questions. Discharge instructions concerning home care has been given.  The patient is STABLE and is discharged to home in good condition.    I personally performed the services described in this documentation, which was scribed in my presence. The recorded information has been reviewed and is accurate.      Jones Skene, MD 07/17/12 1248

## 2012-07-16 NOTE — ED Notes (Addendum)
Presents with left leg laceration approximately 2 inches, bleeding controlled. Leg wrapped with ace bandage and non adherent dressing. Hit leg on floor register while walking.

## 2012-08-07 ENCOUNTER — Encounter: Payer: Self-pay | Admitting: Internal Medicine

## 2012-08-07 ENCOUNTER — Ambulatory Visit (INDEPENDENT_AMBULATORY_CARE_PROVIDER_SITE_OTHER): Payer: Medicare Other

## 2012-08-07 DIAGNOSIS — I359 Nonrheumatic aortic valve disorder, unspecified: Secondary | ICD-10-CM

## 2012-08-07 DIAGNOSIS — Z954 Presence of other heart-valve replacement: Secondary | ICD-10-CM

## 2012-08-07 DIAGNOSIS — I442 Atrioventricular block, complete: Secondary | ICD-10-CM

## 2012-08-07 DIAGNOSIS — Z7901 Long term (current) use of anticoagulants: Secondary | ICD-10-CM

## 2012-09-12 ENCOUNTER — Encounter: Payer: Self-pay | Admitting: Internal Medicine

## 2012-09-12 ENCOUNTER — Ambulatory Visit (INDEPENDENT_AMBULATORY_CARE_PROVIDER_SITE_OTHER): Payer: Medicare Other | Admitting: *Deleted

## 2012-09-12 ENCOUNTER — Ambulatory Visit (INDEPENDENT_AMBULATORY_CARE_PROVIDER_SITE_OTHER): Payer: Medicare Other | Admitting: Internal Medicine

## 2012-09-12 VITALS — BP 159/83 | HR 77 | Wt 240.0 lb

## 2012-09-12 DIAGNOSIS — I359 Nonrheumatic aortic valve disorder, unspecified: Secondary | ICD-10-CM

## 2012-09-12 DIAGNOSIS — I10 Essential (primary) hypertension: Secondary | ICD-10-CM

## 2012-09-12 DIAGNOSIS — Z950 Presence of cardiac pacemaker: Secondary | ICD-10-CM

## 2012-09-12 DIAGNOSIS — Z7901 Long term (current) use of anticoagulants: Secondary | ICD-10-CM

## 2012-09-12 DIAGNOSIS — Z954 Presence of other heart-valve replacement: Secondary | ICD-10-CM

## 2012-09-12 DIAGNOSIS — I442 Atrioventricular block, complete: Secondary | ICD-10-CM

## 2012-09-12 LAB — PACEMAKER DEVICE OBSERVATION
AL AMPLITUDE: 2.5 mv
AL THRESHOLD: 1.25 V
ATRIAL PACING PM: 54
BAMS-0001: 170 {beats}/min
BAMS-0003: 60 {beats}/min
DEVICE MODEL PM: 1380400

## 2012-09-12 NOTE — Patient Instructions (Addendum)
Your physician wants you to follow-up in: 12 months with Dr Court Joy will receive a reminder letter in the mail two months in advance. If you don't receive a letter, please call our office to schedule the follow-up appointment.  [facility] every 3 months

## 2012-09-12 NOTE — Assessment & Plan Note (Signed)
His blood pressure remains elevated. My hope is that with continued weight loss, his pressure will continue to improve. He may ultimately require up titration of his medical therapy.

## 2012-09-12 NOTE — Progress Notes (Signed)
HPI [name] returns today for followup. He is a very pleasant 67 year old man with a history of symptomatic bradycardia due to complete heart block, status post permanent pacemaker insertion. He has done well in the interim. He again some weight and enrolled in Weight [employer] and has lost over 30 pounds. His goal is to get down to 200 pounds. He denies chest pain, shortness of breath, or peripheral edema. No cough or hemoptysis. Allergies  Allergen Reactions  . Codeine Phosphate     REACTION: difficulty breathing  . Metoprolol Tartrate     REACTION: loss of vision     Current Outpatient Prescriptions  Medication Sig Dispense Refill  . Ascorbic Acid (VITAMIN C) 500 MG tablet Take 500 mg by mouth daily.        Marland Kitchen aspirin 81 MG tablet Take 81 mg by mouth daily.        . Calcium Carbonate (CALCIUM 500 PO) Take 1 tablet by mouth daily.       . furosemide (LASIX) 40 MG tablet TAKE ONE TABLET BY MOUTH TWICE DAILY  180 tablet  0  . Glucosamine-Chondroitin 250-200 MG CAPS Take 1 capsule by mouth daily.       Marland Kitchen loratadine (CLARITIN) 10 MG tablet Take 10 mg by mouth daily.      . Meclizine HCl (BONINE) 25 MG CHEW Chew 1 tablet by mouth daily.        . metoprolol succinate (TOPROL-XL) 100 MG 24 hr tablet TAKE ONE TABLET BY MOUTH EVERY DAY  90 tablet  3  . Multiple Vitamin (MULTIVITAMIN) tablet Take 1 tablet by mouth daily.        . nitroGLYCERIN (NITROSTAT) 0.4 MG SL tablet Place 0.4 mg under the tongue every 5 (five) minutes as needed. For chest pain      . Omega-3 Fatty Acids (FISH OIL) 1000 MG CAPS Take 1,000 mg by mouth 2 (two) times daily.       . potassium chloride (KLOR-CON M10) 10 MEQ tablet Take 1 tablet (10 mEq total) by mouth daily.  90 tablet  6  . pravastatin (PRAVACHOL) 40 MG tablet Take 1 tablet (40 mg total) by mouth every evening.  90 tablet  1  . ramipril (ALTACE) 10 MG capsule Take 1 capsule (10 mg total) by mouth daily.  90 capsule  6  . tadalafil (CIALIS) 10 MG tablet Take 1  tablet (10 mg total) by mouth daily as needed.  10 tablet  6  . traMADol (ULTRAM) 50 MG tablet       . vitamin B-12 (CYANOCOBALAMIN) 100 MCG tablet Take 100 mcg by mouth daily.        Marland Kitchen warfarin (COUMADIN) 10 MG tablet Take 5-10 mg by mouth daily. Take 10 mg everyday except Monday take 5 mg      . [DISCONTINUED] potassium chloride (KLOR-CON 10) 10 MEQ CR tablet Take 1 tablet (10 mEq total) by mouth 2 (two) times daily.  180 tablet  6   No current facility-administered medications for this visit.     Past Medical History  Diagnosis Date  . Aortic stenosis 08/25/2010    AVR  2005  . AORTIC VALVE REPLACEMENT, HX OF 05/23/2009    2005   Bental with aortic root conduit and mechanical AVR  . AV BLOCK, COMPLETE 11/01/2008    PTVDP  02/2004  . CAD (coronary artery disease) 01/16/2007    CABG  2005  . DIVERTICULOSIS, COLON 10/17/2007  . DIZZINESS 06/19/2009  . GOUT  01/16/2007  . HEMORRHOIDS, INTERNAL 11/01/2008  . HYPERLIPIDEMIA 01/16/2007  . HYPERTENSION 01/16/2007  . LEG EDEMA 11/27/2008  . PACEMAKER, PERMANENT 11/01/2008    Dr Ladona Ridgel 02/2004  . Impaired glucose tolerance   . Carotid artery disease     minimal, doppler 2005  . Special screening for malignant neoplasm of prostate   . Warfarin anticoagulation     AVR  . Ejection fraction     50-55%, echo, 09/2009, inferior/posterior,apical hypo  . Hx of CABG     2005    ROS:   All systems reviewed and negative except as noted in the HPI.   Past Surgical History  Procedure Laterality Date  . Coronary artery bypass graft    . Aortic valve replacement    . Pacemaker insertion    . Shoulder surgery    . Cardiac catheterization  06/2003  . Circumcision    . Colonoscopy       Family History  Problem Relation Age of Onset  . Heart attack Father   . Cancer Father     renal cancer  . Diabetes Brother      History   Social History  . Marital Status: Married    Spouse Name: N/A    Number of Children: N/A  . Years of Education: N/A    Occupational History  . Not on file.   Social History Main Topics  . Smoking status: Former Smoker    Quit date: 07/12/1973  . Smokeless tobacco: Never Used  . Alcohol Use: No  . Drug Use: Not on file  . Sexually Active: Not on file   Other Topics Concern  . Not on file   Social History Narrative  . No narrative on file     BP 159/83  Pulse 77  Wt 240 lb (108.863 kg)  BMI 37.58 kg/m2  Physical Exam:  Well appearing 67 year old man,NAD HEENT: Unremarkable Neck:  7 cm JVD, no thyromegally Lungs:  Clear with no wheezes, rales, or rhonchi. HEART:  Regular rate rhythm, no murmurs, no rubs, no clicks Abd:  soft, positive bowel sounds, no organomegally, no rebound, no guarding Ext:  2 plus pulses, no edema, no cyanosis, no clubbing Skin:  No rashes no nodules Neuro:  CN II through XII intact, motor grossly intact  EKG - normal sinus rhythm with P. Synchronous ventricular pacing, QRS duration 200 ms  DEVICE  Normal device function.  See PaceArt for details.   Assess/Plan:

## 2012-09-12 NOTE — Assessment & Plan Note (Signed)
His [employer] dual-chamber pacemaker is working normally. We'll plan to recheck in several months. 

## 2012-09-19 ENCOUNTER — Telehealth: Payer: Self-pay | Admitting: Internal Medicine

## 2012-09-19 MED ORDER — POTASSIUM CHLORIDE CRYS ER 10 MEQ PO TBCR: 10.0000 meq | EXTENDED_RELEASE_TABLET | Freq: Two times a day (BID) | ORAL | Status: DC

## 2012-09-19 MED ORDER — NITROGLYCERIN 0.4 MG SL SUBL: 0.4000 mg | SUBLINGUAL_TABLET | SUBLINGUAL | Status: DC | PRN

## 2012-09-19 NOTE — Telephone Encounter (Signed)
Patient's wife called stating that his potassium was called in for 1poqd and patient takes 1po bid. Please correct and send to [facility] in [place].

## 2012-09-19 NOTE — Telephone Encounter (Signed)
Pt notified Rx for Potassium 1 by mouth twice a day was sent to pharmacy and also refill for Nitroglycerin. Pt verbalized understanding.

## 2012-10-02 ENCOUNTER — Other Ambulatory Visit (INDEPENDENT_AMBULATORY_CARE_PROVIDER_SITE_OTHER): Payer: Medicare Other

## 2012-10-02 DIAGNOSIS — I10 Essential (primary) hypertension: Secondary | ICD-10-CM

## 2012-10-02 DIAGNOSIS — Z0000 Encounter for general adult medical examination without abnormal findings: Secondary | ICD-10-CM

## 2012-10-02 LAB — POCT URINALYSIS DIPSTICK
Bilirubin, UA: NEGATIVE
Glucose, UA: NEGATIVE
Ketones, UA: NEGATIVE
Leukocytes, UA: NEGATIVE
pH, UA: 6

## 2012-10-02 LAB — CBC WITH DIFFERENTIAL/PLATELET
Basophils Relative: 0.5 % (ref 0.0–3.0)
Eosinophils Relative: 1.3 % (ref 0.0–5.0)
Lymphocytes Relative: 21.6 % (ref 12.0–46.0)
Neutrophils Relative %: 70.5 % (ref 43.0–77.0)
RBC: 5.17 Mil/uL (ref 4.22–5.81)
WBC: 6.8 10*3/uL (ref 4.5–10.5)

## 2012-10-02 LAB — BASIC METABOLIC PANEL
Calcium: 9.3 mg/dL (ref 8.4–10.5)
Chloride: 102 mEq/L (ref 96–112)
Creatinine, Ser: 0.9 mg/dL (ref 0.4–1.5)
GFR: 84.76 mL/min (ref >60.00)

## 2012-10-02 LAB — LIPID PANEL
HDL: 33.9 mg/dL — ABNORMAL LOW (ref >39.00)
Triglycerides: 160 mg/dL — ABNORMAL HIGH (ref 0.0–149.0)
VLDL: 32 mg/dL (ref 0.0–40.0)

## 2012-10-02 LAB — HEPATIC FUNCTION PANEL
ALT: 19 U/L (ref 0–53)
Alkaline Phosphatase: 48 U/L (ref 39–117)
Bilirubin, Direct: 0.1 mg/dL (ref 0.0–0.3)
Total Protein: 6.4 g/dL (ref 6.0–8.3)

## 2012-10-08 ENCOUNTER — Other Ambulatory Visit: Payer: Self-pay | Admitting: Internal Medicine

## 2012-10-09 ENCOUNTER — Ambulatory Visit (INDEPENDENT_AMBULATORY_CARE_PROVIDER_SITE_OTHER): Payer: Medicare Other | Admitting: Internal Medicine

## 2012-10-09 ENCOUNTER — Encounter: Payer: Self-pay | Admitting: Internal Medicine

## 2012-10-09 VITALS — BP 150/90 | HR 73 | Temp 97.9°F | Resp 18 | Ht 67.0 in | Wt 232.0 lb

## 2012-10-09 DIAGNOSIS — Z0000 Encounter for general adult medical examination without abnormal findings: Secondary | ICD-10-CM

## 2012-10-09 DIAGNOSIS — Z954 Presence of other heart-valve replacement: Secondary | ICD-10-CM

## 2012-10-09 DIAGNOSIS — Z950 Presence of cardiac pacemaker: Secondary | ICD-10-CM

## 2012-10-09 DIAGNOSIS — I442 Atrioventricular block, complete: Secondary | ICD-10-CM

## 2012-10-09 DIAGNOSIS — I2510 Atherosclerotic heart disease of native coronary artery without angina pectoris: Secondary | ICD-10-CM

## 2012-10-09 DIAGNOSIS — R7309 Other abnormal glucose: Secondary | ICD-10-CM

## 2012-10-09 DIAGNOSIS — R7302 Impaired glucose tolerance (oral): Secondary | ICD-10-CM

## 2012-10-09 DIAGNOSIS — I10 Essential (primary) hypertension: Secondary | ICD-10-CM

## 2012-10-09 DIAGNOSIS — Z7901 Long term (current) use of anticoagulants: Secondary | ICD-10-CM

## 2012-10-09 NOTE — Patient Instructions (Signed)
Limit your sodium (Salt) intake    It is important that you exercise regularly, at least 20 minutes 3 to 4 times per week.  If you develop chest pain or shortness of breath seek  medical attention.  You need to lose weight.  Consider a lower calorie diet and regular exercise.  Return in 6 months for follow-up   

## 2012-10-09 NOTE — Progress Notes (Signed)
Patient ID: [name], male   DOB: [birth year], 67 y.o.   MRN: [id number]  Subjective:    Patient ID: [name], male    DOB: [birth year], 67 y.o.   MRN: [id number]  HPI 44 -year-old patient who is seen today for a preventive health examination    CC: cpx - doing well.  History of Present Illness:   He is followed by cardiology and has a history of complete heart block status post pacemaker insertion history of coronary artery disease and aortic valve replacement. Surgery. He has done quite well. Denies any cardiotonic complaints. He remains on chronic Coumadin therapy and follow the Coumadin clinic. He is on statin therapy   Allergies:  1) ! Lopressor (Metoprolol Tartrate)  2) Codeine Phosphate (Codeine Phosphate)   Past History:  Past Medical History:  Reviewed history from 08/28/2009 and no changes required.  HYPERLIPIDEMIA (ICD-272.4)  HYPERTENSION (ICD-401.9)  CORONARY ARTERY DISEASE (ICD-414.00)  CABG... 2005  Bental procedure... August,.... 2005...( aVR in aortic root conduit)  Aortic stenosis... treated surgically  PACEMAKER (ICD-V45..01).Marland KitchenMarland KitchenAugust, 2005... Dr. Ladona Ridgel  Carotid artery disease minimal... Doppler... August 2 005  AV BLOCK, COMPLETE (ICD-426.0)  HEMORRHOIDS, INTERNAL (ICD-455.0)  ENCOUNTER FOR THERAPEUTIC DRUG MONITORING (ICD-V58.83)  SPECIAL SCREENING MALIGNANT NEOPLASM OF PROSTATE (ICD-V76.44)  DIVERTICULOSIS, COLON (ICD-562.10)  GOUT (ICD-274.9)   Past Surgical History:  Reviewed history from 11/01/2008 and no changes required.  Coronary artery bypass graft August 2005  Aortic valve repair August 2005  Permanent pacemaker - Doylene Canning. Ladona Ridgel, M.D. - 02/25/2004 - complete heart block  Circumcision  Shoulder surgery  status post PCI with stenting December 2004  colonoscopy February 2008, upper panendoscopy, February 2008   Family History:  Reviewed history from 05/18/2007 and no changes required.  thefather at age 60 MI and also had renal  cancer  mother's health unknown  one brother history type 2 diabetes  Positive for hypertension, total two brothers and 3 sisters  died age 71 (one brother)   Social History:  Reviewed history from 11/01/2008 and no changes required.  Full Time  Married  Tobacco Use - Former. 1970  Alcohol Use - no   1. Risk factors, based on past  M,S,F history-  cardiovascular risk factors include hypertension and dyslipidemia. The patient is status post CABG  2.  Physical activities: No exercise restrictions although fairly sedentary. 3.  Depression/mood: No depression or mood disorder  4.  Hearing: No significant deficits  5.  ADL's: Independent in all aspects of daily living  6.  Fall risk: Low  7.  Home safety: No problems identified  8.  Height weight, and visual acuity; height and weight stable weight is 252. No visual complaints  9.  Counseling: More regular exercise heart healthy diet and weight loss all encouraged  10. Lab orders based on risk factors: Laboratory studies reviewed. Impaired glucose tolerance unchanged slight worsening of dyslipidemia. Last issues addressed  11. Referral : Followup cardiology. Continue monthly INR monitoring  12. Care plan: Monthly INR monitoring weight loss regular exercise all recommended  13. Cognitive assessment: Alert and oriented normal affect. No cognitive dysfunction     Review of Systems  Constitutional: Negative for fever, chills, appetite change and fatigue.  HENT: Negative for hearing loss, ear pain, congestion, sore throat, trouble swallowing, neck stiffness, dental problem, voice change and tinnitus.   Eyes: Negative for pain, discharge and visual disturbance.  Respiratory: Negative for cough, chest tightness, wheezing and stridor.   Cardiovascular: Negative for chest pain,  palpitations and leg swelling.  Gastrointestinal: Negative for nausea, vomiting, abdominal pain, diarrhea, constipation, blood in stool and abdominal  distention.  Genitourinary: Negative for urgency, hematuria, flank pain, discharge, difficulty urinating and genital sores.  Musculoskeletal: Negative for myalgias, back pain, joint swelling, arthralgias and gait problem.  Skin: Negative for rash.  Neurological: Negative for dizziness, syncope, speech difficulty, weakness, numbness and headaches.  Hematological: Negative for adenopathy. Does not bruise/bleed easily.  Psychiatric/Behavioral: Negative for behavioral problems and dysphoric mood. The patient is not nervous/anxious.        Objective:   Physical Exam  Constitutional: He appears well-developed and well-nourished.  Weight 232 Blood pressure 120/80  HENT:  Head: Normocephalic and atraumatic.  Right Ear: External ear normal.  Left Ear: External ear normal.  Nose: Nose normal.  Mouth/Throat: Oropharynx is clear and moist.  Eyes: Conjunctivae and EOM are normal. Pupils are equal, round, and reactive to light. No scleral icterus.  Neck: Normal range of motion. Neck supple. No JVD present. No thyromegaly present.  Brief right carotid bruit  Cardiovascular: Normal rate, regular rhythm and intact distal pulses.  Exam reveals no gallop and no friction rub.   No murmur heard. Prosthetic heart sounds without significant murmur  Pulmonary/Chest: Effort normal and breath sounds normal. He exhibits no tenderness.  Abdominal: Soft. Bowel sounds are normal. He exhibits no distension and no mass. There is no tenderness.  Genitourinary: Rectum normal and penis normal. Guaiac negative stool.  Prostate +2 enlarged  Musculoskeletal: Normal range of motion. He exhibits no edema and no tenderness.  Lymphadenopathy:    He has no cervical adenopathy.  Neurological: He is alert. He has normal reflexes. No cranial nerve deficit. Coordination normal.  Skin: Skin is warm and dry. No rash noted.  Psychiatric: He has a normal mood and affect. His behavior is normal.          Assessment & Plan:    Preventive health examination Coronary artery disease status post CABG Status post aortic valve repair on chronic Coumadin anticoagulation Status post AV block status post permanent pacemaker insertion  Obesity Hypertension Dyslipidemia  Lifestyle issues addressed

## 2012-10-11 ENCOUNTER — Other Ambulatory Visit: Payer: Self-pay | Admitting: *Deleted

## 2012-10-11 MED ORDER — FUROSEMIDE 40 MG PO TABS: ORAL_TABLET | ORAL | Status: DC

## 2012-10-24 ENCOUNTER — Ambulatory Visit (INDEPENDENT_AMBULATORY_CARE_PROVIDER_SITE_OTHER): Payer: Medicare Other | Admitting: *Deleted

## 2012-10-24 DIAGNOSIS — I359 Nonrheumatic aortic valve disorder, unspecified: Secondary | ICD-10-CM

## 2012-10-24 DIAGNOSIS — Z954 Presence of other heart-valve replacement: Secondary | ICD-10-CM

## 2012-10-24 DIAGNOSIS — Z7901 Long term (current) use of anticoagulants: Secondary | ICD-10-CM

## 2012-10-24 LAB — POCT INR: INR: 2

## 2012-10-31 ENCOUNTER — Other Ambulatory Visit: Payer: Self-pay | Admitting: Internal Medicine

## 2012-11-06 DIAGNOSIS — I442 Atrioventricular block, complete: Secondary | ICD-10-CM

## 2012-11-09 ENCOUNTER — Encounter: Payer: Self-pay | Admitting: Internal Medicine

## 2012-11-09 ENCOUNTER — Ambulatory Visit (INDEPENDENT_AMBULATORY_CARE_PROVIDER_SITE_OTHER): Payer: Medicare Other | Admitting: Internal Medicine

## 2012-11-09 VITALS — BP 140/86 | HR 66 | Temp 98.3°F | Resp 20 | Wt 231.0 lb

## 2012-11-09 DIAGNOSIS — J309 Allergic rhinitis, unspecified: Secondary | ICD-10-CM

## 2012-11-09 DIAGNOSIS — I10 Essential (primary) hypertension: Secondary | ICD-10-CM

## 2012-11-09 MED ORDER — METHYLPREDNISOLONE ACETATE 80 MG/ML IJ SUSP
80.0000 mg | Freq: Once | INTRAMUSCULAR | Status: AC
  Administered 2012-11-09: 80 mg via INTRAMUSCULAR

## 2012-11-09 NOTE — Progress Notes (Signed)
Subjective:    Patient ID: [name], male    DOB: 09-13-1945, 67 y.o.   MRN: [id number]  HPI   67 year old patient who has treated hypertension. He has a history of aortic stenosis status post aortic valve repair history complete AV block status post pacemaker insertion status post CABG. The past 3 days he has a cough congestion and mild sore throat. Cough is his predominant symptom. There's been no fever or sputum production. He does have a history of seasonal allergies during the spring  Past Medical History  Diagnosis Date  . Aortic stenosis 08/25/2010    AVR  2005  . AORTIC VALVE REPLACEMENT, HX OF 05/23/2009    2005   Bental with aortic root conduit and mechanical AVR  . AV BLOCK, COMPLETE 11/01/2008    PTVDP  02/2004  . CAD (coronary artery disease) 01/16/2007    CABG  2005  . DIVERTICULOSIS, COLON 10/17/2007  . DIZZINESS 06/19/2009  . GOUT 01/16/2007  . HEMORRHOIDS, INTERNAL 11/01/2008  . HYPERLIPIDEMIA 01/16/2007  . HYPERTENSION 01/16/2007  . LEG EDEMA 11/27/2008  . PACEMAKER, PERMANENT 11/01/2008    Dr Ladona Ridgel 02/2004  . Impaired glucose tolerance   . Carotid artery disease     minimal, doppler 2005  . Special screening for malignant neoplasm of prostate   . Warfarin anticoagulation     AVR  . Ejection fraction     50-55%, echo, 09/2009, inferior/posterior,apical hypo  . Hx of CABG     2005    History   Social History  . Marital Status: Married    Spouse Name: N/A    Number of Children: N/A  . Years of Education: N/A   Occupational History  . Not on file.   Social History Main Topics  . Smoking status: Former Smoker    Quit date: 07/12/1973  . Smokeless tobacco: Never Used  . Alcohol Use: No  . Drug Use: Not on file  . Sexually Active: Not on file   Other Topics Concern  . Not on file   Social History Narrative  . No narrative on file    Past Surgical History  Procedure Laterality Date  . Coronary artery bypass graft    . Aortic valve replacement    .  Pacemaker insertion    . Shoulder surgery    . Cardiac catheterization  06/2003  . Circumcision    . Colonoscopy      Family History  Problem Relation Age of Onset  . Heart attack Father   . Cancer Father     renal cancer  . Diabetes Brother     Allergies  Allergen Reactions  . Codeine Phosphate     REACTION: difficulty breathing  . Metoprolol Tartrate     REACTION: loss of vision    Current Outpatient Prescriptions on File Prior to Visit  Medication Sig Dispense Refill  . Ascorbic Acid (VITAMIN C) 500 MG tablet Take 500 mg by mouth daily.        Marland Kitchen aspirin 81 MG tablet Take 81 mg by mouth daily.        . Calcium Carbonate (CALCIUM 500 PO) Take 1 tablet by mouth daily.       . furosemide (LASIX) 40 MG tablet TAKE ONE TABLET BY MOUTH TWICE DAILY  180 tablet  3  . Glucosamine-Chondroitin 250-200 MG CAPS Take 1 capsule by mouth daily.       Marland Kitchen loratadine (CLARITIN) 10 MG tablet Take 10 mg by mouth daily.      Marland Kitchen  Meclizine HCl (BONINE) 25 MG CHEW Chew 1 tablet by mouth daily.        . metoprolol succinate (TOPROL-XL) 100 MG 24 hr tablet TAKE ONE TABLET BY MOUTH EVERY DAY  90 tablet  1  . Multiple Vitamin (MULTIVITAMIN) tablet Take 1 tablet by mouth daily.        . nitroGLYCERIN (NITROSTAT) 0.4 MG SL tablet Place 1 tablet (0.4 mg total) under the tongue every 5 (five) minutes as needed. For chest pain  30 tablet  1  . Omega-3 Fatty Acids (FISH OIL) 1000 MG CAPS Take 1,000 mg by mouth 2 (two) times daily.       . potassium chloride (KLOR-CON M10) 10 MEQ tablet Take 1 tablet (10 mEq total) by mouth 2 (two) times daily.  180 tablet  3  . pravastatin (PRAVACHOL) 40 MG tablet TAKE ONE TABLET BY MOUTH EVERY DAY IN THE EVENING  90 tablet  1  . ramipril (ALTACE) 10 MG capsule Take 1 capsule (10 mg total) by mouth daily.  90 capsule  6  . tadalafil (CIALIS) 10 MG tablet Take 1 tablet (10 mg total) by mouth daily as needed.  10 tablet  6  . traMADol (ULTRAM) 50 MG tablet       . vitamin B-12  (CYANOCOBALAMIN) 100 MCG tablet Take 100 mcg by mouth daily.        Marland Kitchen warfarin (COUMADIN) 10 MG tablet Take 5-10 mg by mouth daily. Take 10 mg everyday except Monday take 5 mg      . [DISCONTINUED] potassium chloride (KLOR-CON 10) 10 MEQ CR tablet Take 1 tablet (10 mEq total) by mouth 2 (two) times daily.  180 tablet  6   No current facility-administered medications on file prior to visit.    BP 140/86  Pulse 66  Temp(Src) 98.3 F (36.8 C) (Oral)  Resp 20  Wt 231 lb (104.781 kg)  BMI 36.17 kg/m2  SpO2 98%       Review of Systems  Constitutional: Positive for fatigue. Negative for fever, chills and appetite change.  HENT: Positive for congestion, sore throat, rhinorrhea, voice change and postnasal drip. Negative for hearing loss, ear pain, trouble swallowing, neck stiffness, dental problem and tinnitus.   Eyes: Negative for pain, discharge and visual disturbance.  Respiratory: Positive for cough. Negative for chest tightness, wheezing and stridor.   Cardiovascular: Negative for chest pain, palpitations and leg swelling.  Gastrointestinal: Negative for nausea, vomiting, abdominal pain, diarrhea, constipation, blood in stool and abdominal distention.  Genitourinary: Negative for urgency, hematuria, flank pain, discharge, difficulty urinating and genital sores.  Musculoskeletal: Negative for myalgias, back pain, joint swelling, arthralgias and gait problem.  Skin: Negative for rash.  Neurological: Negative for dizziness, syncope, speech difficulty, weakness, numbness and headaches.  Hematological: Negative for adenopathy. Does not bruise/bleed easily.  Psychiatric/Behavioral: Negative for behavioral problems and dysphoric mood. The patient is not nervous/anxious.        Objective:   Physical Exam  Constitutional: He is oriented to person, place, and time. He appears well-developed.  HENT:  Head: Normocephalic.  Right Ear: External ear normal.  Left Ear: External ear normal.   Eyes: Conjunctivae and EOM are normal.  Neck: Normal range of motion.  Cardiovascular: Normal rate, regular rhythm and normal heart sounds.   Prosthetic heart sounds  Pulmonary/Chest: Breath sounds normal.  Abdominal: Bowel sounds are normal.  Musculoskeletal: Normal range of motion. He exhibits no edema and no tenderness.  Neurological: He is alert and oriented  to person, place, and time.  Psychiatric: He has a normal mood and affect. His behavior is normal.          Assessment & Plan:   Allergic rhinitis/bronchitis we'll continue nonsedating antihistamines treat cough symptomatically and also treat with Depo-Medrol Hypertension stable

## 2012-11-09 NOTE — Patient Instructions (Addendum)
Acute bronchitis symptoms for less than 10 days are generally not helped by antibiotics.  Take over-the-counter expectorants and cough medications such as  Mucinex DM.  Call if there is no improvement in 5 to 7 days or if he developed worsening cough, fever, or new symptoms, such as shortness of breath or chest pain.    

## 2012-12-25 ENCOUNTER — Ambulatory Visit (INDEPENDENT_AMBULATORY_CARE_PROVIDER_SITE_OTHER): Payer: Medicare Other | Admitting: *Deleted

## 2012-12-25 DIAGNOSIS — Z954 Presence of other heart-valve replacement: Secondary | ICD-10-CM

## 2012-12-25 DIAGNOSIS — Z7901 Long term (current) use of anticoagulants: Secondary | ICD-10-CM

## 2013-01-02 ENCOUNTER — Other Ambulatory Visit (INDEPENDENT_AMBULATORY_CARE_PROVIDER_SITE_OTHER): Payer: Self-pay | Admitting: Surgery

## 2013-01-02 ENCOUNTER — Encounter (INDEPENDENT_AMBULATORY_CARE_PROVIDER_SITE_OTHER): Payer: Self-pay | Admitting: Surgery

## 2013-01-02 ENCOUNTER — Ambulatory Visit (INDEPENDENT_AMBULATORY_CARE_PROVIDER_SITE_OTHER): Payer: Medicare Other | Admitting: Surgery

## 2013-01-02 ENCOUNTER — Encounter (INDEPENDENT_AMBULATORY_CARE_PROVIDER_SITE_OTHER): Payer: Self-pay | Admitting: General Surgery

## 2013-01-02 VITALS — BP 136/70 | HR 60 | Temp 97.0°F | Resp 16 | Ht 67.75 in | Wt 231.6 lb

## 2013-01-02 DIAGNOSIS — K4091 Unilateral inguinal hernia, without obstruction or gangrene, recurrent: Secondary | ICD-10-CM

## 2013-01-02 DIAGNOSIS — K4090 Unilateral inguinal hernia, without obstruction or gangrene, not specified as recurrent: Secondary | ICD-10-CM

## 2013-01-02 DIAGNOSIS — K469 Unspecified abdominal hernia without obstruction or gangrene: Secondary | ICD-10-CM

## 2013-01-02 NOTE — Progress Notes (Signed)
Patient ID: [name], male   DOB: [birth year], 67 y.o.   MRN: [id number]  Chief Complaint  Patient presents with  . Follow-up    LTFU - hernia    HPI [name] is a 67 y.o. male.  PCP - Dr. Amador Cunas for recurrent right inguinal hernia  Cardiologist - Dr. Myrtis Ser HPI [name] who is status post open repair of a large indirect right inguinal hernia with mesh on 11/14/09. He had been doing quite well until about a year ago. He started developing some swelling in his right groin. This has become larger and more uncomfortable. It is especially uncomfortable when he is bending over. He denies any obstructive symptoms and continues to have regular bowel movements. He does state that his groin feels uncomfortable when he is having a bowel movement. He remains anticoagulated for his artificial heart valve. Last time we had to bridge him with Lovenox.  Past Medical History  Diagnosis Date  . Aortic stenosis 08/25/2010    AVR  2005  . AORTIC VALVE REPLACEMENT, HX OF 05/23/2009    2005   Bental with aortic root conduit and mechanical AVR  . AV BLOCK, COMPLETE 11/01/2008    PTVDP  02/2004  . CAD (coronary artery disease) 01/16/2007    CABG  2005  . DIVERTICULOSIS, COLON 10/17/2007  . DIZZINESS 06/19/2009  . GOUT 01/16/2007  . HEMORRHOIDS, INTERNAL 11/01/2008  . HYPERLIPIDEMIA 01/16/2007  . HYPERTENSION 01/16/2007  . LEG EDEMA 11/27/2008  . PACEMAKER, PERMANENT 11/01/2008    Dr Ladona Ridgel 02/2004  . Impaired glucose tolerance   . Carotid artery disease     minimal, doppler 2005  . Special screening for malignant neoplasm of prostate   . Warfarin anticoagulation     AVR  . Ejection fraction     50-55%, echo, 09/2009, inferior/posterior,apical hypo  . Hx of CABG     2005  . Asthma   . Arthritis     Past Surgical History  Procedure Laterality Date  . Coronary artery bypass graft    . Aortic valve replacement    . Pacemaker insertion    . Shoulder surgery    . Cardiac  catheterization  06/2003  . Circumcision    . Colonoscopy    . Hernia repair      Family History  Problem Relation Age of Onset  . Heart attack Father   . Cancer Father     renal cancer  . Diabetes Brother   . Hypertension Mother   . Diabetes Mother     Social History History  Substance Use Topics  . Smoking status: Former Smoker    Quit date: 07/12/1973  . Smokeless tobacco: Never Used  . Alcohol Use: No    Allergies  Allergen Reactions  . Codeine Phosphate     REACTION: difficulty breathing  . Metoprolol Tartrate     REACTION: loss of vision    Current Outpatient Prescriptions  Medication Sig Dispense Refill  . Ascorbic Acid (VITAMIN C) 500 MG tablet Take 500 mg by mouth daily.        Marland Kitchen aspirin 81 MG tablet Take 81 mg by mouth daily.        . Calcium Carbonate (CALCIUM 500 PO) Take 1 tablet by mouth daily.       . furosemide (LASIX) 40 MG tablet TAKE ONE TABLET BY MOUTH TWICE DAILY  180 tablet  3  . Glucosamine-Chondroitin 250-200 MG CAPS Take 1 capsule by mouth  daily.       . loratadine (CLARITIN) 10 MG tablet Take 10 mg by mouth daily.      . Meclizine HCl (BONINE) 25 MG CHEW Chew 1 tablet by mouth daily.        . metoprolol succinate (TOPROL-XL) 100 MG 24 hr tablet TAKE ONE TABLET BY MOUTH EVERY DAY  90 tablet  1  . Multiple Vitamin (MULTIVITAMIN) tablet Take 1 tablet by mouth daily.        . nitroGLYCERIN (NITROSTAT) 0.4 MG SL tablet Place 1 tablet (0.4 mg total) under the tongue every 5 (five) minutes as needed. For chest pain  30 tablet  1  . Omega-3 Fatty Acids (FISH OIL) 1000 MG CAPS Take 1,000 mg by mouth 2 (two) times daily.       . potassium chloride (KLOR-CON M10) 10 MEQ tablet Take 1 tablet (10 mEq total) by mouth 2 (two) times daily.  180 tablet  3  . pravastatin (PRAVACHOL) 40 MG tablet TAKE ONE TABLET BY MOUTH EVERY DAY IN THE EVENING  90 tablet  1  . ramipril (ALTACE) 10 MG capsule Take 1 capsule (10 mg total) by mouth daily.  90 capsule  6  .  tadalafil (CIALIS) 10 MG tablet Take 1 tablet (10 mg total) by mouth daily as needed.  10 tablet  6  . traMADol (ULTRAM) 50 MG tablet       . vitamin B-12 (CYANOCOBALAMIN) 100 MCG tablet Take 100 mcg by mouth daily.        Marland Kitchen warfarin (COUMADIN) 10 MG tablet Take 5-10 mg by mouth daily. Take 10 mg everyday except Monday take 5 mg      . [DISCONTINUED] potassium chloride (KLOR-CON 10) 10 MEQ CR tablet Take 1 tablet (10 mEq total) by mouth 2 (two) times daily.  180 tablet  6   No current facility-administered medications for this visit.    Review of Systems Review of Systems  Constitutional: Negative for fever, chills and unexpected weight change.  HENT: Negative for hearing loss, congestion, sore throat, trouble swallowing and voice change.   Eyes: Negative for visual disturbance.  Respiratory: Negative for cough and wheezing.   Cardiovascular: Negative for chest pain, palpitations and leg swelling.  Gastrointestinal: Negative for nausea, vomiting, abdominal pain, diarrhea, constipation, blood in stool, abdominal distention, anal bleeding and rectal pain.  Genitourinary: Negative for hematuria and difficulty urinating.  Musculoskeletal: Negative for arthralgias.  Skin: Negative for rash and wound.  Neurological: Negative for seizures, syncope, weakness and headaches.  Hematological: Negative for adenopathy. Does not bruise/bleed easily.  Psychiatric/Behavioral: Negative for confusion.    Blood pressure 136/70, pulse 60, temperature 97 F (36.1 C), temperature source Oral, resp. rate 16, height 5' 7.75" (1.721 m), weight 231 lb 9.6 oz (105.053 kg).  Physical Exam Physical Exam WDWN in NAD HEENT:  EOMI, sclera anicteric Neck:  No masses, no thyromegaly Lungs:  CTA bilaterally; normal respiratory effort CV:  Regular rate and rhythm; no murmurs Abd:  +bowel sounds, soft, non-tender, no masses GU:  Healed right inguinal incision - palpable hernia - reducible;  Small reducible left  inguinal hernia  Ext:  Well-perfused; no edema Skin:  Warm, dry; no sign of jaundice  Data Reviewed none  Assessment    Recurrent right inguinal hernia Left inguinal hernia     Plan    Laparoscopic bilateral inguinal hernia repairs with mesh.  The surgical procedure has been discussed with the patient.  Potential risks, benefits, alternative treatments, and  expected outcomes have been explained.  All of the patient's questions at this time have been answered.  The likelihood of reaching the patient's treatment goal is good.  The patient understand the proposed surgical procedure and wishes to proceed.  We will first obtain clearance from Dr. Myrtis Ser.  We will probably have to bridge his anticoagulation with Lovenox.        Avayah Raffety K. 01/02/2013, 1:29 PM

## 2013-01-03 ENCOUNTER — Telehealth: Payer: Self-pay

## 2013-01-03 DIAGNOSIS — I2510 Atherosclerotic heart disease of native coronary artery without angina pectoris: Secondary | ICD-10-CM

## 2013-01-03 DIAGNOSIS — Z954 Presence of other heart-valve replacement: Secondary | ICD-10-CM

## 2013-01-03 NOTE — Telephone Encounter (Signed)
From: Luis Abed, MD Sent: 01/02/2013 7:37 PM To: Homero Fellers Via, LPN This patient has an appointment to see me for surgical clearance for hernia surgery on July 15. He has coronary disease post CABG in 2005. He has not had any exercise testing since that time. He does need to have some studies done before I see him in the office. Please contact him. Please arrange for him to have a [employer]. The diagnosis is coronary disease post CABG 2005. Please also arrange for him to have a 2-D echo. The diagnosis is status post Bental procedure with a mechanical aortic valve placement in 2005   Pt advised, he verbalized understanding. Lexiscan and Echo ordered and message sent to [facility] to schedule and to contact pt with date and times of tests. Also, I went over [facility] instructions with pt and he repeated all instructions back to me and verbalized understanding.

## 2013-01-05 ENCOUNTER — Ambulatory Visit (HOSPITAL_COMMUNITY): Payer: Medicare Other | Attending: Internal Medicine | Admitting: Radiology

## 2013-01-05 DIAGNOSIS — Z954 Presence of other heart-valve replacement: Secondary | ICD-10-CM | POA: Insufficient documentation

## 2013-01-05 DIAGNOSIS — I2510 Atherosclerotic heart disease of native coronary artery without angina pectoris: Secondary | ICD-10-CM | POA: Insufficient documentation

## 2013-01-05 DIAGNOSIS — I10 Essential (primary) hypertension: Secondary | ICD-10-CM | POA: Insufficient documentation

## 2013-01-05 DIAGNOSIS — I359 Nonrheumatic aortic valve disorder, unspecified: Secondary | ICD-10-CM

## 2013-01-05 NOTE — Progress Notes (Signed)
Echocardiogram performed.  

## 2013-01-08 ENCOUNTER — Ambulatory Visit (HOSPITAL_COMMUNITY): Payer: Medicare Other | Attending: Cardiology | Admitting: Radiology

## 2013-01-08 VITALS — BP 132/94 | Ht 68.0 in | Wt 229.0 lb

## 2013-01-08 DIAGNOSIS — Z01810 Encounter for preprocedural cardiovascular examination: Secondary | ICD-10-CM | POA: Insufficient documentation

## 2013-01-08 DIAGNOSIS — I779 Disorder of arteries and arterioles, unspecified: Secondary | ICD-10-CM | POA: Insufficient documentation

## 2013-01-08 DIAGNOSIS — I10 Essential (primary) hypertension: Secondary | ICD-10-CM | POA: Insufficient documentation

## 2013-01-08 DIAGNOSIS — I2510 Atherosclerotic heart disease of native coronary artery without angina pectoris: Secondary | ICD-10-CM

## 2013-01-08 DIAGNOSIS — R0789 Other chest pain: Secondary | ICD-10-CM | POA: Insufficient documentation

## 2013-01-08 DIAGNOSIS — E785 Hyperlipidemia, unspecified: Secondary | ICD-10-CM | POA: Insufficient documentation

## 2013-01-08 DIAGNOSIS — Z8249 Family history of ischemic heart disease and other diseases of the circulatory system: Secondary | ICD-10-CM | POA: Insufficient documentation

## 2013-01-08 DIAGNOSIS — Z9221 Personal history of antineoplastic chemotherapy: Secondary | ICD-10-CM | POA: Insufficient documentation

## 2013-01-08 DIAGNOSIS — K4090 Unilateral inguinal hernia, without obstruction or gangrene, not specified as recurrent: Secondary | ICD-10-CM | POA: Insufficient documentation

## 2013-01-08 DIAGNOSIS — Z951 Presence of aortocoronary bypass graft: Secondary | ICD-10-CM | POA: Insufficient documentation

## 2013-01-08 DIAGNOSIS — Z950 Presence of cardiac pacemaker: Secondary | ICD-10-CM | POA: Insufficient documentation

## 2013-01-08 DIAGNOSIS — Z87891 Personal history of nicotine dependence: Secondary | ICD-10-CM | POA: Insufficient documentation

## 2013-01-08 MED ORDER — TECHNETIUM TC 99M SESTAMIBI GENERIC - CARDIOLITE
11.0000 | Freq: Once | INTRAVENOUS | Status: AC | PRN
  Administered 2013-01-08: 11 via INTRAVENOUS

## 2013-01-08 MED ORDER — ADENOSINE (DIAGNOSTIC) 3 MG/ML IV SOLN
0.5600 mg/kg | Freq: Once | INTRAVENOUS | Status: AC
  Administered 2013-01-08: 58.2 mg via INTRAVENOUS

## 2013-01-08 MED ORDER — TECHNETIUM TC 99M SESTAMIBI GENERIC - CARDIOLITE
33.0000 | Freq: Once | INTRAVENOUS | Status: AC | PRN
  Administered 2013-01-08: 33 via INTRAVENOUS

## 2013-01-08 NOTE — Progress Notes (Signed)
[facility] SITE 3 NUCLEAR MED [address] [place], [place] [address] [phone]    Cardiology Nuclear Med Study  [name] is a 67 y.o. male     MRN : [id number]     DOB: 17-Sep-1945  Procedure Date: 01/08/2013  Nuclear Med Background Indication for Stress Test:  Evaluation for Ischemia, Graft Patency, and Surgical Clearance for  (L) Inguinal hernia repair on 01-23-13 by Manus Rudd, MD History:  2005 Pacemaker CHB, 8/05 H/O Chemo, 8/05 CABG x2 + AVR 09/2009 ECHO EF: 50-55% Cardiac Risk Factors: Carotid Disease, Family History - CAD, History of Smoking, Hypertension and Lipids  Symptoms:  Chest Tightness    Nuclear Pre-Procedure Caffeine/Decaff Intake:  None> 12 hrs NPO After: 8:00pm   Lungs:  clear O2 Sat: 96% on room air. IV 0.9% NS with Angio Cath:  20g  IV Site: R Antecubital x 1, tolerated well IV Started by:  Irean Hong, RN  Chest Size (in):  44 Cup Size: n/a  Height: 5\' 8"  (1.727 m)  Weight:  229 lb (103.874 kg)  BMI:  Body mass index is 34.83 kg/(m^2). Tech Comments:  No Toprol or other medications today    Nuclear Med Study 1 or 2 day study: 1 day  Stress Test Type:  Adenosine  Reading MD: Cassell Clement, MD  Order Authorizing Provider:  Willa Rough, MD  Resting Radionuclide: Technetium 55m Sestamibi  Resting Radionuclide Dose: 11.0 mCi   Stress Radionuclide:  Technetium 14m Sestamibi  Stress Radionuclide Dose: 33.0 mCi           Stress Protocol Rest HR: 68 Stress HR: 75  Rest BP: 132/94 Stress BP: 132/82  Exercise Time (min): n/a METS: n/a   Predicted Max HR: 152 bpm % Max HR: 49.34 bpm Rate Pressure Product: 9900   Dose of Adenosine (mg):  58.3 Dose of Lexiscan: n/a mg  Dose of Atropine (mg): n/a Dose of Dobutamine: n/a mcg/kg/min (at max HR)  Stress Test Technologist: Milana Na, EMT-P  Nuclear Technologist:  Domenic Polite, CNMT     Rest Procedure:  Myocardial perfusion imaging was performed at rest 45 minutes  following the intravenous administration of Technetium 1m Sestamibi. Rest ECG: AV paced rhythm  Stress Procedure:  The patient received IV adenosine at 140 mcg/kg/min for 4 minutes.  Technetium 8m Sestamibi was injected at the 2 minute mark and quantitative spect images were obtained after a 45 minute delay. Stress ECG: No significant change from baseline ECG  QPS Raw Data Images:  Normal; no motion artifact; normal heart/lung ratio. Stress Images:  Normal homogeneous uptake in all areas of the myocardium. Rest Images:  Normal homogeneous uptake in all areas of the myocardium. Subtraction (SDS):  No evidence of ischemia. Transient Ischemic Dilatation (Normal <1.22):NA Lung/Heart Ratio (Normal <0.45):  0.43  Quantitative Gated Spect Images QGS EDV:  163 ml QGS ESV:  88 ml  Impression Exercise Capacity:  Adenosine study with no exercise. BP Response:  Normal blood pressure response. Clinical Symptoms:  Mild chest pain/dyspnea. ECG Impression:  No significant ECG changes with Lexiscan. Comparison with Prior Nuclear Study: No images to compare  Overall Impression:  Low risk stress nuclear study.  No ischemia. Mildly depressed LV systolic dysfunction..  LV Ejection Fraction: 46%.  LV Wall Motion:   Mildly depressed LV systolic function.Mild apical hypokinesia.   Cassell Clement

## 2013-01-21 ENCOUNTER — Encounter: Payer: Self-pay | Admitting: Cardiology

## 2013-01-21 DIAGNOSIS — I340 Nonrheumatic mitral (valve) insufficiency: Secondary | ICD-10-CM | POA: Insufficient documentation

## 2013-01-23 ENCOUNTER — Telehealth (INDEPENDENT_AMBULATORY_CARE_PROVIDER_SITE_OTHER): Payer: Self-pay | Admitting: General Surgery

## 2013-01-23 ENCOUNTER — Ambulatory Visit (INDEPENDENT_AMBULATORY_CARE_PROVIDER_SITE_OTHER): Payer: Medicare Other | Admitting: Cardiology

## 2013-01-23 ENCOUNTER — Ambulatory Visit (INDEPENDENT_AMBULATORY_CARE_PROVIDER_SITE_OTHER): Payer: Medicare Other | Admitting: *Deleted

## 2013-01-23 ENCOUNTER — Telehealth: Payer: Self-pay | Admitting: *Deleted

## 2013-01-23 ENCOUNTER — Encounter: Payer: Self-pay | Admitting: Cardiology

## 2013-01-23 VITALS — BP 128/74 | HR 64 | Ht 67.0 in | Wt 231.8 lb

## 2013-01-23 DIAGNOSIS — Z954 Presence of other heart-valve replacement: Secondary | ICD-10-CM

## 2013-01-23 DIAGNOSIS — Z7901 Long term (current) use of anticoagulants: Secondary | ICD-10-CM

## 2013-01-23 DIAGNOSIS — Z01810 Encounter for preprocedural cardiovascular examination: Secondary | ICD-10-CM

## 2013-01-23 DIAGNOSIS — I2510 Atherosclerotic heart disease of native coronary artery without angina pectoris: Secondary | ICD-10-CM

## 2013-01-23 DIAGNOSIS — I359 Nonrheumatic aortic valve disorder, unspecified: Secondary | ICD-10-CM

## 2013-01-23 LAB — POCT INR: INR: 2.1

## 2013-01-23 NOTE — Patient Instructions (Signed)
**Note De-Identified  Obfuscation** You have been given cardiac clearance to have hernia repair  Your physician wants you to follow-up in: 1 year. You will receive a reminder letter in the mail two months in advance. If you don't receive a letter, please call our office to schedule the follow-up appointment.

## 2013-01-23 NOTE — Assessment & Plan Note (Signed)
His coronary disease is stable. He did have a nuclear scan recently and there is no ischemia.

## 2013-01-23 NOTE — Telephone Encounter (Signed)
Called patient to let him know that we have cardiac clearance and also told her that I sent a message in epic to sharon Dr Myrtis Ser nurse needing to know about the Lovenox bridging. I told [name] once I hear back from Dr Myrtis Ser office that I will let them know

## 2013-01-23 NOTE — Assessment & Plan Note (Signed)
He continues on Coumadin. We will be able to hold this with bridging for the surgery that he needs.

## 2013-01-23 NOTE — Telephone Encounter (Signed)
Message copied by Jeannine Kitten on Tue Jan 23, 2013  4:50 PM ------      Message from: Wilder Glade      Created: Tue Jan 23, 2013  2:31 PM       Hey. I got Dr. Myrtis Ser note for the ok for hernia surgery. Dr Corliss Skains wants to know is Dr Myrtis Ser going to bridge lovenox for the patient. Please let me know so I can tell Dr Corliss Skains either way. Thanks Pattricia Boss Dr Corliss Skains nurse. ------

## 2013-01-23 NOTE — Telephone Encounter (Signed)
Called and spoke with Pattricia Boss nurse with DR Harlon Flor and she understands that from Dr Myrtis Ser notes  pt has clearance to hold coumadin and be bridged with Lovenox and pt will be given lovenox instructions when date of surgery has been scheduled.

## 2013-01-23 NOTE — Assessment & Plan Note (Signed)
Patient received a mechanical aortic valve in 2005. It has been followed carefully over the years and he continues to work well. He had a recent echo once again showing good valvular function. No further workup needed.

## 2013-01-23 NOTE — Progress Notes (Signed)
HPI  Patient is seen for followup coronary disease and aortic valve disease. He's also seen for preop clearance for hernia surgery and for possible knee surgery. He is doing well. I saw him last September, 2013. When I heard that he needed to be cleared for surgery, I reviewed his records carefully. He had not had any type of cardiac testing for many years. Decision was made to proceed with both an echo and a nuclear scan. He echo reveals ejection fraction 55%. He has some inferior hypokinesis. His prosthetic aortic valve gradients are good. There is mild mitral regurgitation. He had a nuclear scan revealing no significant ischemia.  Today in the office he's feeling well. He's not having any significant chest pain or shortness of breath.  Allergies  Allergen Reactions  . Codeine Phosphate     REACTION: difficulty breathing  . Metoprolol Tartrate     REACTION: loss of vision    Current Outpatient Prescriptions  Medication Sig Dispense Refill  . Ascorbic Acid (VITAMIN C) 500 MG tablet Take 1,000 mg by mouth daily.       Marland Kitchen aspirin 81 MG tablet Take 81 mg by mouth daily.        . calcium carbonate (OS-CAL) 600 MG TABS Take 600 mg by mouth 2 (two) times daily with a meal.      . furosemide (LASIX) 40 MG tablet TAKE ONE TABLET BY MOUTH TWICE DAILY  180 tablet  3  . Glucosamine-Chondroitin 250-200 MG CAPS Take 1 capsule by mouth daily.       Marland Kitchen loratadine (CLARITIN) 10 MG tablet Take 10 mg by mouth daily.      . Meclizine HCl (BONINE) 25 MG CHEW Chew 1 tablet by mouth daily.        . metoprolol succinate (TOPROL-XL) 100 MG 24 hr tablet TAKE ONE TABLET BY MOUTH EVERY DAY  90 tablet  1  . Multiple Vitamin (MULTIVITAMIN) tablet Take 1 tablet by mouth daily.        . nitroGLYCERIN (NITROSTAT) 0.4 MG SL tablet Place 1 tablet (0.4 mg total) under the tongue every 5 (five) minutes as needed. For chest pain  30 tablet  1  . Omega-3 Fatty Acids (FISH OIL) 1000 MG CAPS Take 1,000 mg by mouth 2 (two) times  daily.       . potassium chloride (KLOR-CON M10) 10 MEQ tablet Take 1 tablet (10 mEq total) by mouth 2 (two) times daily.  180 tablet  3  . pravastatin (PRAVACHOL) 40 MG tablet TAKE ONE TABLET BY MOUTH EVERY DAY IN THE EVENING  90 tablet  1  . ramipril (ALTACE) 10 MG capsule Take 1 capsule (10 mg total) by mouth daily.  90 capsule  6  . tadalafil (CIALIS) 10 MG tablet Take 1 tablet (10 mg total) by mouth daily as needed.  10 tablet  6  . traMADol (ULTRAM) 50 MG tablet       . vitamin B-12 (CYANOCOBALAMIN) 1000 MCG tablet Take 1,000 mcg by mouth daily.      Marland Kitchen warfarin (COUMADIN) 10 MG tablet Take 5-10 mg by mouth daily. Take 10 mg everyday except Monday take 5 mg      . [DISCONTINUED] potassium chloride (KLOR-CON 10) 10 MEQ CR tablet Take 1 tablet (10 mEq total) by mouth 2 (two) times daily.  180 tablet  6   No current facility-administered medications for this visit.    History   Social History  . Marital Status: Married  Spouse Name: N/A    Number of Children: N/A  . Years of Education: N/A   Occupational History  . Not on file.   Social History Main Topics  . Smoking status: Former Smoker    Quit date: 07/12/1973  . Smokeless tobacco: Never Used  . Alcohol Use: No  . Drug Use: No  . Sexually Active: Not on file   Other Topics Concern  . Not on file   Social History Narrative  . No narrative on file    Family History  Problem Relation Age of Onset  . Heart attack Father   . Cancer Father     renal cancer  . Diabetes Brother   . Hypertension Mother   . Diabetes Mother     Past Medical History  Diagnosis Date  . Aortic stenosis 08/25/2010    AVR  2005  . AORTIC VALVE REPLACEMENT, HX OF 05/23/2009    2005   Bental with aortic root conduit and mechanical AVR  . AV BLOCK, COMPLETE 11/01/2008    PTVDP  02/2004  . CAD (coronary artery disease) 01/16/2007    CABG  2005  . DIVERTICULOSIS, COLON 10/17/2007  . DIZZINESS 06/19/2009  . GOUT 01/16/2007  . HEMORRHOIDS,  INTERNAL 11/01/2008  . HYPERLIPIDEMIA 01/16/2007  . HYPERTENSION 01/16/2007  . LEG EDEMA 11/27/2008  . PACEMAKER, PERMANENT 11/01/2008    Dr Ladona Ridgel 02/2004  . Impaired glucose tolerance   . Carotid artery disease     minimal, doppler 2005  . Special screening for malignant neoplasm of prostate   . Warfarin anticoagulation     AVR  . Ejection fraction     50-55%, echo, 09/2009, inferior/posterior,apical hypo  . Hx of CABG     2005  . Asthma   . Arthritis     Past Surgical History  Procedure Laterality Date  . Coronary artery bypass graft    . Aortic valve replacement    . Pacemaker insertion    . Shoulder surgery    . Cardiac catheterization  06/2003  . Circumcision    . Colonoscopy    . Hernia repair      Patient Active Problem List   Diagnosis Date Noted  . Preop cardiovascular exam 01/23/2013  . Mitral regurgitation 01/21/2013  . Recurrent right inguinal hernia 01/02/2013  . Left inguinal hernia 01/02/2013  . Impaired glucose tolerance   . Carotid artery disease   . Warfarin anticoagulation   . Ejection fraction   . Hx of CABG   . Aortic stenosis 08/25/2010  . DIZZINESS 06/19/2009  . AORTIC VALVE REPLACEMENT, HX OF 05/23/2009  . LEG EDEMA 11/27/2008  . AV BLOCK, COMPLETE 11/01/2008  . PACEMAKER, PERMANENT 11/01/2008  . DIVERTICULOSIS, COLON 10/17/2007  . GOUT 01/16/2007  . CAD (coronary artery disease) 01/16/2007  . HYPERLIPIDEMIA 01/16/2007  . HYPERTENSION 01/16/2007    ROS   Patient denies fever, chills, headache, sweats, rash, change in vision, change in hearing, chest pain, cough, nausea vomiting, urinary symptoms. All other systems are reviewed and are negative.  PHYSICAL EXAM  Patient is overweight is stable. He is oriented to person time and place. Affect is normal. He's here with his wife. There is no jugulovenous distention. Lungs are clear. Respiratory effort is nonlabored. Cardiac exam reveals crisp closure sound of the aortic prosthesis. Abdomen is  soft. There is no peripheral edema. There no musculoskeletal deformities. There are no skin rashes.  Filed Vitals:   01/23/13 1158  BP: 128/74  Pulse: 64  Height: 5\' 7"  (1.702 m)  Weight: 231 lb 12.8 oz (105.144 kg)   EKG is done today and reviewed by me. The rhythm is paced.  ASSESSMENT & PLAN

## 2013-01-23 NOTE — Assessment & Plan Note (Signed)
The patient is stable to have hernia repairs. If decision is made for knee surgery he is also stable for this. His Coumadin can be held but he will need to be bridged with Lovenox. His mechanical valve was placed in 2005. He is cleared for his surgery.

## 2013-01-24 ENCOUNTER — Telehealth (INDEPENDENT_AMBULATORY_CARE_PROVIDER_SITE_OTHER): Payer: Self-pay | Admitting: General Surgery

## 2013-01-24 ENCOUNTER — Telehealth (INDEPENDENT_AMBULATORY_CARE_PROVIDER_SITE_OTHER): Payer: Self-pay | Admitting: Surgery

## 2013-01-24 ENCOUNTER — Telehealth: Payer: Self-pay | Admitting: Cardiology

## 2013-01-24 MED ORDER — ENOXAPARIN SODIUM 100 MG/ML ~~LOC~~ SOLN: 100.0000 mg | Freq: Two times a day (BID) | SUBCUTANEOUS | Status: DC

## 2013-01-24 NOTE — Telephone Encounter (Signed)
I spoke with the pt's wife and she was calling to let the coumadin clinic know that the pt's surgery has been scheduled for 02/07/13.  The pt does require a lovenox bridge and our clinic was waiting on the date of surgery before providing instructions.  I will forward this message to the Anticoagulation Clinic.

## 2013-01-24 NOTE — Telephone Encounter (Signed)
Called patient to let him know that I spoke to Kennon Rounds who is over the coumadin clinic and she will relay the message to Lelon Perla who works with Dr Myrtis Ser and they will bridge the patient with Lovenox. And Jasmine December will call the patient and will have him come in so she can show him what to do with the bridging and what to do after his surgery with his coumadin. Patient schedule for surgery on 02-07-13 at [facility]

## 2013-01-24 NOTE — Patient Instructions (Addendum)
Scheduled for Hernia repair on July 30th 02/01/2013 last day to take coumadin  02/02/2013 no coumadin no lovenox 02/03/2013 no coumadin Lovenox 100mg  8am and lovenox 100 mg 8pm 02/04/2013 no coumadin Lovenox 100 mg 8am and Lovenox 100 mg 8pm 02/05/2013 no coumadin  Lovenox 100 mg 8am and Lovenox 100 mg 8PM 02/06/2013 no coumadin Lovenox 100 mg 8am only 02/07/2013 no coumadin no Lovenox day of Surgery then when surgeon states may restart coumadin and Lovenox continue Lovenox 100 mg at 8am and Lovenox 100 mg 8pm and restart coumadin at same dose except take extra 1/2 tablet for 2 days and take coumadin and lovenox until seen in coumadin clinic on Monday August 4th

## 2013-01-24 NOTE — Telephone Encounter (Signed)
Will call pt and give instructions regarding when to stop coumadin and dosing regarding Lovenox .Will place this information in his coumadin note of 01/23/2013

## 2013-01-24 NOTE — Telephone Encounter (Signed)
Sx scheduled 02/07/13 note on orders to advise so Dr Myrtis Ser can bridge lovenox

## 2013-01-24 NOTE — Telephone Encounter (Signed)
New Prob     Wanting to get some info prior to surgery coming up. Please call.

## 2013-01-26 ENCOUNTER — Encounter (HOSPITAL_COMMUNITY): Payer: Self-pay | Admitting: Pharmacy Technician

## 2013-01-30 NOTE — Patient Instructions (Addendum)
Malachy Coleman Brandy  01/30/2013   Your procedure is scheduled on:  02/07/13              Surgery 100pm-250pm  Report to [facility] at    1030  AM.  Call this number if you have problems the morning of surgery: [phone]   Remember:   Do not eat food after midnite .              May have clear liquids until 0630am then npo.    Take these medicines the morning of surgery with A SIP OF WATER:    Do not wear jewelry,  Do not wear lotions, powders, or perfumes.   Do not shave 48 hours prior to surgery. Men may shave face and neck.  Do not bring valuables to the hospital.  Contacts, dentures or bridgework may not be worn into surgery.     Patients discharged the day of surgery will not be allowed to drive  home.  Name and phone number of your driver:    SEE CHG INSTRUCTION SHEET    Please read over the following fact sheets that you were given:, coughing and deep breathing exercises, leg exercises               Failure to comply with these instructions may result in cancellation of your surgery.                Patient Signature ____________________________              Nurse Signature _____________________________

## 2013-01-31 ENCOUNTER — Ambulatory Visit (HOSPITAL_COMMUNITY)
Admission: RE | Admit: 2013-01-31 | Discharge: 2013-01-31 | Disposition: A | Discharge status: Home / Self Care | Payer: Medicare Other | Source: Ambulatory Visit | Attending: Surgery | Admitting: Surgery

## 2013-01-31 ENCOUNTER — Encounter (HOSPITAL_COMMUNITY)
Admission: RE | Admit: 2013-01-31 | Discharge: 2013-01-31 | Disposition: A | Discharge status: Home / Self Care | Payer: Medicare Other | Source: Ambulatory Visit | Attending: Surgery | Admitting: Surgery

## 2013-01-31 ENCOUNTER — Encounter (HOSPITAL_COMMUNITY): Payer: Self-pay

## 2013-01-31 DIAGNOSIS — Z950 Presence of cardiac pacemaker: Secondary | ICD-10-CM | POA: Insufficient documentation

## 2013-01-31 DIAGNOSIS — Z01812 Encounter for preprocedural laboratory examination: Secondary | ICD-10-CM | POA: Insufficient documentation

## 2013-01-31 DIAGNOSIS — Z954 Presence of other heart-valve replacement: Secondary | ICD-10-CM | POA: Insufficient documentation

## 2013-01-31 DIAGNOSIS — I10 Essential (primary) hypertension: Secondary | ICD-10-CM | POA: Insufficient documentation

## 2013-01-31 DIAGNOSIS — Z01818 Encounter for other preprocedural examination: Secondary | ICD-10-CM | POA: Insufficient documentation

## 2013-01-31 DIAGNOSIS — K4091 Unilateral inguinal hernia, without obstruction or gangrene, recurrent: Secondary | ICD-10-CM | POA: Insufficient documentation

## 2013-01-31 DIAGNOSIS — Z951 Presence of aortocoronary bypass graft: Secondary | ICD-10-CM | POA: Insufficient documentation

## 2013-01-31 HISTORY — DX: Gastro-esophageal reflux disease without esophagitis: K21.9

## 2013-01-31 LAB — CBC
Hemoglobin: 15.5 g/dL (ref 13.0–17.0)
MCH: 30 pg (ref 26.0–34.0)
Platelets: 171 10*3/uL (ref 150–400)
RBC: 5.17 MIL/uL (ref 4.22–5.81)
WBC: 8.1 10*3/uL (ref 4.0–10.5)

## 2013-01-31 LAB — BASIC METABOLIC PANEL
CO2: 30 mEq/L (ref 19–32)
Calcium: 9.6 mg/dL (ref 8.4–10.5)
Chloride: 99 mEq/L (ref 96–112)
Glucose, Bld: 94 mg/dL (ref 70–99)
Potassium: 4.1 mEq/L (ref 3.5–5.1)
Sodium: 137 mEq/L (ref 135–145)

## 2013-01-31 LAB — PROTIME-INR
INR: 1.67 — ABNORMAL HIGH (ref 0.00–1.49)
Prothrombin Time: 19.2 seconds — ABNORMAL HIGH (ref 11.6–15.2)

## 2013-01-31 NOTE — Progress Notes (Addendum)
09/12/12 last office visit with Dr Ladona Ridgel [facility] 01/23/13 Last office visit with Dr Myrtis Ser [facility] Stress 01/09/13  ECHO 01/05/13   EKG 01/23/13 EPIC  Last device Observaton 09/12/12 EPIC  Carotid Duplex 03/27/12 EPIC

## 2013-02-01 NOTE — Progress Notes (Signed)
[name] at [facility] made aware of device orders on patient.  Aware pt's arrival time in Short Stay and time of surgery.  [name] stated they would be here.

## 2013-02-01 NOTE — Progress Notes (Signed)
Called [address] [facility] regarding patient's upcoming surgery on 02/07/13.  Rep to call me back.

## 2013-02-05 DIAGNOSIS — I442 Atrioventricular block, complete: Secondary | ICD-10-CM

## 2013-02-07 ENCOUNTER — Ambulatory Visit (HOSPITAL_COMMUNITY)
Admission: RE | Admit: 2013-02-07 | Discharge: 2013-02-07 | Disposition: A | Discharge status: Home / Self Care | Payer: Medicare Other | Source: Ambulatory Visit | Attending: Surgery | Admitting: Surgery

## 2013-02-07 ENCOUNTER — Encounter (HOSPITAL_COMMUNITY): Payer: Self-pay | Admitting: *Deleted

## 2013-02-07 ENCOUNTER — Ambulatory Visit (HOSPITAL_COMMUNITY): Payer: Medicare Other | Admitting: Anesthesiology

## 2013-02-07 ENCOUNTER — Encounter (HOSPITAL_COMMUNITY)
Admission: RE | Disposition: A | Discharge status: Home / Self Care | Payer: Self-pay | Source: Ambulatory Visit | Attending: Surgery

## 2013-02-07 ENCOUNTER — Encounter (HOSPITAL_COMMUNITY): Payer: Self-pay | Admitting: Anesthesiology

## 2013-02-07 DIAGNOSIS — K4090 Unilateral inguinal hernia, without obstruction or gangrene, not specified as recurrent: Secondary | ICD-10-CM

## 2013-02-07 DIAGNOSIS — I2510 Atherosclerotic heart disease of native coronary artery without angina pectoris: Secondary | ICD-10-CM | POA: Insufficient documentation

## 2013-02-07 DIAGNOSIS — I10 Essential (primary) hypertension: Secondary | ICD-10-CM | POA: Insufficient documentation

## 2013-02-07 DIAGNOSIS — Z950 Presence of cardiac pacemaker: Secondary | ICD-10-CM | POA: Insufficient documentation

## 2013-02-07 DIAGNOSIS — K4091 Unilateral inguinal hernia, without obstruction or gangrene, recurrent: Secondary | ICD-10-CM | POA: Insufficient documentation

## 2013-02-07 DIAGNOSIS — Z951 Presence of aortocoronary bypass graft: Secondary | ICD-10-CM | POA: Insufficient documentation

## 2013-02-07 DIAGNOSIS — E785 Hyperlipidemia, unspecified: Secondary | ICD-10-CM | POA: Insufficient documentation

## 2013-02-07 DIAGNOSIS — Z79899 Other long term (current) drug therapy: Secondary | ICD-10-CM | POA: Insufficient documentation

## 2013-02-07 DIAGNOSIS — J45909 Unspecified asthma, uncomplicated: Secondary | ICD-10-CM | POA: Insufficient documentation

## 2013-02-07 DIAGNOSIS — Z954 Presence of other heart-valve replacement: Secondary | ICD-10-CM | POA: Insufficient documentation

## 2013-02-07 HISTORY — PX: INSERTION OF MESH: SHX5868

## 2013-02-07 HISTORY — PX: INGUINAL HERNIA REPAIR: SHX194

## 2013-02-07 LAB — PROTIME-INR
INR: 0.99 (ref 0.00–1.49)
Prothrombin Time: 12.9 seconds (ref 11.6–15.2)

## 2013-02-07 LAB — APTT: aPTT: 28 seconds (ref 24–37)

## 2013-02-07 SURGERY — REPAIR, HERNIA, INGUINAL, BILATERAL, LAPAROSCOPIC
Anesthesia: General | Site: Groin | Laterality: Bilateral | Wound class: Clean

## 2013-02-07 MED ORDER — MIDAZOLAM HCL 5 MG/5ML IJ SOLN
INTRAMUSCULAR | Status: DC | PRN
  Administered 2013-02-07: 2 mg via INTRAVENOUS

## 2013-02-07 MED ORDER — HYDROMORPHONE HCL PF 1 MG/ML IJ SOLN: 0.2500 mg | INTRAMUSCULAR | Status: DC | PRN

## 2013-02-07 MED ORDER — LACTATED RINGERS IV SOLN: INTRAVENOUS | Status: DC

## 2013-02-07 MED ORDER — ONDANSETRON HCL 4 MG/2ML IJ SOLN
4.0000 mg | INTRAMUSCULAR | Status: DC | PRN
Start: 2013-02-07 — End: 2013-02-07

## 2013-02-07 MED ORDER — LIDOCAINE HCL 4 % MT SOLN
OROMUCOSAL | Status: DC | PRN
  Administered 2013-02-07: 4 mL via TOPICAL

## 2013-02-07 MED ORDER — LACTATED RINGERS IV SOLN
INTRAVENOUS | Status: DC
  Administered 2013-02-07: 15:00:00 via INTRAVENOUS
  Administered 2013-02-07: 1000 mL via INTRAVENOUS

## 2013-02-07 MED ORDER — ACETAMINOPHEN 10 MG/ML IV SOLN
1000.0000 mg | Freq: Once | INTRAVENOUS | Status: DC
  Filled 2013-02-07: qty 100

## 2013-02-07 MED ORDER — CEFAZOLIN SODIUM-DEXTROSE 2-3 GM-% IV SOLR
2.0000 g | INTRAVENOUS | Status: AC
  Administered 2013-02-07: 2 g via INTRAVENOUS

## 2013-02-07 MED ORDER — ACETAMINOPHEN 10 MG/ML IV SOLN
INTRAVENOUS | Status: DC | PRN
  Administered 2013-02-07: 1000 mg via INTRAVENOUS

## 2013-02-07 MED ORDER — ROCURONIUM BROMIDE 100 MG/10ML IV SOLN
INTRAVENOUS | Status: DC | PRN
  Administered 2013-02-07: 60 mg via INTRAVENOUS
  Administered 2013-02-07 (×2): 10 mg via INTRAVENOUS

## 2013-02-07 MED ORDER — BUPIVACAINE-EPINEPHRINE 0.25% -1:200000 IJ SOLN
INTRAMUSCULAR | Status: DC | PRN
  Administered 2013-02-07: 6 mL

## 2013-02-07 MED ORDER — ONDANSETRON HCL 4 MG/2ML IJ SOLN
INTRAMUSCULAR | Status: DC | PRN
  Administered 2013-02-07: 4 mg via INTRAVENOUS

## 2013-02-07 MED ORDER — PROPOFOL 10 MG/ML IV BOLUS
INTRAVENOUS | Status: DC | PRN
  Administered 2013-02-07: 160 mg via INTRAVENOUS

## 2013-02-07 MED ORDER — CHLORHEXIDINE GLUCONATE 4 % EX LIQD
1.0000 "application " | Freq: Once | CUTANEOUS | Status: DC
  Filled 2013-02-07: qty 15

## 2013-02-07 MED ORDER — BUPIVACAINE-EPINEPHRINE 0.25% -1:200000 IJ SOLN
INTRAMUSCULAR | Status: AC
  Filled 2013-02-07: qty 1

## 2013-02-07 MED ORDER — CEFAZOLIN SODIUM-DEXTROSE 2-3 GM-% IV SOLR
INTRAVENOUS | Status: AC
  Filled 2013-02-07: qty 50

## 2013-02-07 MED ORDER — FENTANYL CITRATE 0.05 MG/ML IJ SOLN
INTRAMUSCULAR | Status: DC | PRN
  Administered 2013-02-07 (×2): 50 ug via INTRAVENOUS
  Administered 2013-02-07: 100 ug via INTRAVENOUS
  Administered 2013-02-07: 50 ug via INTRAVENOUS
  Administered 2013-02-07: 150 ug via INTRAVENOUS

## 2013-02-07 MED ORDER — 0.9 % SODIUM CHLORIDE (POUR BTL) OPTIME
TOPICAL | Status: DC | PRN
  Administered 2013-02-07: 1000 mL

## 2013-02-07 MED ORDER — NEOSTIGMINE METHYLSULFATE 1 MG/ML IJ SOLN
INTRAMUSCULAR | Status: DC | PRN
  Administered 2013-02-07: 2 mg via INTRAVENOUS

## 2013-02-07 MED ORDER — LACTATED RINGERS IV SOLN
INTRAVENOUS | Status: DC | PRN
  Administered 2013-02-07: 1000 mL via INTRAVENOUS

## 2013-02-07 MED ORDER — TRAMADOL HCL 50 MG PO TABS: 100.0000 mg | ORAL_TABLET | Freq: Four times a day (QID) | ORAL | Status: DC | PRN

## 2013-02-07 MED ORDER — MORPHINE SULFATE 10 MG/ML IJ SOLN: 2.0000 mg | INTRAMUSCULAR | Status: DC | PRN

## 2013-02-07 MED ORDER — GLYCOPYRROLATE 0.2 MG/ML IJ SOLN
INTRAMUSCULAR | Status: DC | PRN
  Administered 2013-02-07: 0.4 mg via INTRAVENOUS

## 2013-02-07 SURGICAL SUPPLY — 42 items
APL SKNCLS STERI-STRIP NONHPOA (GAUZE/BANDAGES/DRESSINGS) ×2
BENZOIN TINCTURE PRP APPL 2/3 (GAUZE/BANDAGES/DRESSINGS) ×3 IMPLANT
CABLE HIGH FREQUENCY MONO STRZ (ELECTRODE) ×3 IMPLANT
CLOTH BEACON ORANGE TIMEOUT ST (SAFETY) ×3 IMPLANT
COVER SURGICAL LIGHT HANDLE (MISCELLANEOUS) ×2 IMPLANT
DECANTER SPIKE VIAL GLASS SM (MISCELLANEOUS) ×3 IMPLANT
DEVICE SECURE STRAP 25 ABSORB (INSTRUMENTS) ×1 IMPLANT
DISSECT BALLN SPACEMKR + OVL (BALLOONS) ×3
DISSECTOR BALLN SPACEMKR + OVL (BALLOONS) ×2 IMPLANT
DISSECTOR BLUNT TIP ENDO 5MM (MISCELLANEOUS) IMPLANT
DRAPE LAPAROSCOPIC ABDOMINAL (DRAPES) ×3 IMPLANT
DRSG TEGADERM 2-3/8X2-3/4 SM (GAUZE/BANDAGES/DRESSINGS) ×2 IMPLANT
ELECT REM PT RETURN 9FT ADLT (ELECTROSURGICAL) ×3
ELECTRODE REM PT RTRN 9FT ADLT (ELECTROSURGICAL) ×2 IMPLANT
GLOVE BIOGEL PI IND STRL 7.0 (GLOVE) ×2 IMPLANT
GLOVE BIOGEL PI INDICATOR 7.0 (GLOVE) ×1
GOWN STRL NON-REIN LRG LVL3 (GOWN DISPOSABLE) ×1 IMPLANT
GOWN STRL REIN XL XLG (GOWN DISPOSABLE) ×4 IMPLANT
KIT BASIN OR (CUSTOM PROCEDURE TRAY) ×3 IMPLANT
MARKER SKIN DUAL TIP RULER LAB (MISCELLANEOUS) ×3 IMPLANT
MESH 3DMAX LIGHT 4.1X6.2 LT LR (Mesh General) ×2 IMPLANT
MESH 3DMAX LIGHT 4.1X6.2 RT LR (Mesh General) ×1 IMPLANT
NDL INSUFFLATION 14GA 120MM (NEEDLE) IMPLANT
NEEDLE INSUFFLATION 14GA 120MM (NEEDLE) IMPLANT
NS IRRIG 1000ML POUR BTL (IV SOLUTION) ×3 IMPLANT
PENCIL BUTTON HOLSTER BLD 10FT (ELECTRODE) ×2 IMPLANT
SCISSORS LAP 5X35 DISP (ENDOMECHANICALS) IMPLANT
SET IRRIG TUBING LAPAROSCOPIC (IRRIGATION / IRRIGATOR) ×3 IMPLANT
SOLUTION ANTI FOG 6CC (MISCELLANEOUS) ×3 IMPLANT
STRIP CLOSURE SKIN 1/2X4 (GAUZE/BANDAGES/DRESSINGS) ×3 IMPLANT
SUT VIC AB 3-0 SH 27 (SUTURE)
SUT VIC AB 3-0 SH 27XBRD (SUTURE) IMPLANT
SUT VIC AB 4-0 SH 18 (SUTURE) ×2 IMPLANT
SYR 30ML LL (SYRINGE) ×2 IMPLANT
TACKER 5MM HERNIA 3.5CML NAB (ENDOMECHANICALS) ×2 IMPLANT
TOWEL OR 17X26 10 PK STRL BLUE (TOWEL DISPOSABLE) ×3 IMPLANT
TRAY FOLEY CATH 14FRSI W/METER (CATHETERS) ×3 IMPLANT
TRAY LAP CHOLE (CUSTOM PROCEDURE TRAY) ×3 IMPLANT
TROCAR BLADELESS OPT 5 75 (ENDOMECHANICALS) ×4 IMPLANT
TROCAR CANNULA W/PORT DUAL 5MM (MISCELLANEOUS) ×1 IMPLANT
TUBING FILTER THERMOFLATOR (ELECTROSURGICAL) ×2 IMPLANT
TUBING INSUFFLATION 10FT LAP (TUBING) ×2 IMPLANT

## 2013-02-07 NOTE — Anesthesia Preprocedure Evaluation (Addendum)
Anesthesia Evaluation  Patient identified by MRN, date of birth, ID band Patient awake    Reviewed: Allergy & Precautions, H&P , NPO status , Patient's Chart, lab work & pertinent test results, reviewed documented beta blocker date and time   Airway Mallampati: II TM Distance: >3 FB Neck ROM: full    Dental no notable dental hx. (+) Teeth Intact and Dental Advisory Given   Pulmonary neg pulmonary ROS, asthma ,  breath sounds clear to auscultation  Pulmonary exam normal       Cardiovascular hypertension, Pt. on home beta blockers and Pt. on medications + angina + CAD and + CABG + dysrhythmias + pacemaker + Valvular Problems/Murmurs AS Rhythm:regular Rate:Normal + Systolic murmurs S/p  AVR.  CABG 2005. EF 50%   Neuro/Psych negative neurological ROS  negative psych ROS   GI/Hepatic negative GI ROS, Neg liver ROS, GERD-  Medicated and Controlled,  Endo/Other  negative endocrine ROS  Renal/GU negative Renal ROS  negative genitourinary   Musculoskeletal   Abdominal   Peds  Hematology negative hematology ROS (+)   Anesthesia Other Findings   Reproductive/Obstetrics negative OB ROS                          Anesthesia Physical Anesthesia Plan  ASA: III  Anesthesia Plan: General   Post-op Pain Management:    Induction: Intravenous  Airway Management Planned: Oral ETT  Additional Equipment:   Intra-op Plan:   Post-operative Plan: Extubation in OR  Informed Consent: I have reviewed the patients History and Physical, chart, labs and discussed the procedure including the risks, benefits and alternatives for the proposed anesthesia with the patient or authorized representative who has indicated his/her understanding and acceptance.   Dental Advisory Given  Plan Discussed with: CRNA and Surgeon  Anesthesia Plan Comments:         Anesthesia Quick Evaluation

## 2013-02-07 NOTE — Transfer of Care (Signed)
Immediate Anesthesia Transfer of Care Note  Patient: [name]  Procedure(s) Performed: Procedure(s): LAPAROSCOPIC BILATERAL INGUINAL HERNIA REPAIR (Bilateral) INSERTION OF MESH (Bilateral)  Patient Location: PACU  Anesthesia Type:General  Level of Consciousness: awake, alert , oriented and patient cooperative  Airway & Oxygen Therapy: Patient Spontanous Breathing and Patient connected to face mask oxygen  Post-op Assessment: Report given to PACU RN, Post -op Vital signs reviewed and stable and Patient moving all extremities X 4  Post vital signs: stable  Complications: No apparent anesthesia complications

## 2013-02-07 NOTE — Op Note (Signed)
Pre-Op diagnosis:  1.  Recurrent right inguinal hernia    2.  Primary left inguinal hernia Post-op Diagnosis:  Same Procedure:  Laparoscopic bilateral inguinal hernia repairs with mesh Surgeon:  Alyrica Thurow K. Anesthesia:  GETT Indications: [name] who presents 3 years after having an open repair of a right inguinal hernia. He has developed a recurrence in this area. He also has a new left inguinal hernia. He presents now for laparoscopic repair of both of these. He has had his Coumadin held and has been on Lovenox for the last several days. Description of procedure: The patient brought to the operating room placed in supine position on the operating room table. After adequate level of general anesthesia was obtained a Foley catheter was placed under sterile technique. The patient's abdomen and groins were prepped with chlor prep. His scrotum was prepped with Betadine. We draped sterile fashion. A timeout was taken to ensure the proper patient proper procedure. We began working towards his right side. 2 cm below the umbilicus we made a transverse incision just to the left of midline. Dissection was carried down to the anterior surface of the rectus sheath. We incised the rectus sheath and retracted the rectus muscle. We entered the retromuscular space.The spacemaker balloon was inserted and advanced down to the symphysis pubis. We inflated the dissection balloon and held this in place for several minutes for hemostasis. We then removed the balloon and insufflated CO2 into the preperitoneal space. We maintaining a maximum pressure of 15 mm mercury. Two 5 mm ports placed in the lower midline.We began working towards his right side. We open the preperitoneal space all the way out to the anterior superior iliac spine. We examined the abdominal wall and dissected around the spermatic cord. The patient has a fairly large indirect hernia sac containing fat. We were able to reduce this completely  and by peeling this away from the spermatic cord. There is a small direct defect but this does not seem to be containing any contents. Once we completely reduced indirect hernia sac we used a large 3-D Max mesh. We inserted this into the preperitoneal space on the right and deployed at in the proper orientation. The lower edge was tucked underneath the edge of the peritoneum. We secured the mesh anteriorly with 3 staples from the secure strap device. We inspected for hemostasis. We then turned our attention to the other side. I moved to the other side of the table. We opened up the preperitoneal space on the left. The patient had another smaller indirect hernia. We were able to reduce this by peeling away from the spermatic cord. No direct defect was noted. Once we completely reduced this hernia sac, We placed a large 3-D Max left-sided mesh. This was also secured with 3 staples from the secure strap device. The lower edge the mesh was tucked underneath the edge of the peritoneum. We again inspected both pieces of mesh and released pneumoperitoneum under direct vision. The trochars were removed. The fascia was closed at the infraumbilical port site with 0 Vicryl. 4 Monocryl was used to close all the skin incisions. Both testicles were palpated down within the scrotum. There was no insufflation of the scrotum. Steri-Strips and dry dressings were applied. The patient was then extubated and brought to recovery in stable condition. Foley catheter was removed.  Wilmon Arms. Corliss Skains, MD, [facility]  General/ Trauma Surgery  02/07/2013 2:57 PM

## 2013-02-07 NOTE — Anesthesia Postprocedure Evaluation (Signed)
  Anesthesia Post-op Note  Patient: [name]  Procedure(s) Performed: Procedure(s) (LRB): LAPAROSCOPIC BILATERAL INGUINAL HERNIA REPAIR (Bilateral) INSERTION OF MESH (Bilateral)  Patient Location: PACU  Anesthesia Type: General  Level of Consciousness: awake and alert   Airway and Oxygen Therapy: Patient Spontanous Breathing  Post-op Pain: mild  Post-op Assessment: Post-op Vital signs reviewed, Patient's Cardiovascular Status Stable, Respiratory Function Stable, Patent Airway and No signs of Nausea or vomiting  Last Vitals:  Filed Vitals:   02/07/13 1500  BP: 136/67  Pulse: 70  Temp:   Resp: 15    Post-op Vital Signs: stable   Complications: No apparent anesthesia complications

## 2013-02-07 NOTE — H&P (Signed)
HPI  [name] is a 67 y.o. male. PCP - Dr. Amador Cunas for recurrent right inguinal hernia  Cardiologist - Dr. Myrtis Ser  HPI  [name] who is status post open repair of a large indirect right inguinal hernia with mesh on 11/14/09. He had been doing quite well until about a year ago. He started developing some swelling in his right groin. This has become larger and more uncomfortable. It is especially uncomfortable when he is bending over. He denies any obstructive symptoms and continues to have regular bowel movements. He does state that his groin feels uncomfortable when he is having a bowel movement. He remains anticoagulated for his artificial heart valve. Last time we had to bridge him with Lovenox.  Past Medical History   Diagnosis  Date   .  Aortic stenosis  08/25/2010     AVR 2005   .  AORTIC VALVE REPLACEMENT, HX OF  05/23/2009     2005 Bental with aortic root conduit and mechanical AVR   .  AV BLOCK, COMPLETE  11/01/2008     PTVDP 02/2004   .  CAD (coronary artery disease)  01/16/2007     CABG 2005   .  DIVERTICULOSIS, COLON  10/17/2007   .  DIZZINESS  06/19/2009   .  GOUT  01/16/2007   .  HEMORRHOIDS, INTERNAL  11/01/2008   .  HYPERLIPIDEMIA  01/16/2007   .  HYPERTENSION  01/16/2007   .  LEG EDEMA  11/27/2008   .  PACEMAKER, PERMANENT  11/01/2008     Dr Ladona Ridgel 02/2004   .  Impaired glucose tolerance    .  Carotid artery disease      minimal, doppler 2005   .  Special screening for malignant neoplasm of prostate    .  Warfarin anticoagulation      AVR   .  Ejection fraction      50-55%, echo, 09/2009, inferior/posterior,apical hypo   .  Hx of CABG      2005   .  Asthma    .  Arthritis     Past Surgical History   Procedure  Laterality  Date   .  Coronary artery bypass graft     .  Aortic valve replacement     .  Pacemaker insertion     .  Shoulder surgery     .  Cardiac catheterization   06/2003   .  Circumcision     .  Colonoscopy     .  Hernia repair       Family History   Problem  Relation  Age of Onset   .  Heart attack  Father    .  Cancer  Father      renal cancer   .  Diabetes  Brother    .  Hypertension  Mother    .  Diabetes  Mother    Social History  History   Substance Use Topics   .  Smoking status:  Former Smoker     Quit date:  07/12/1973   .  Smokeless tobacco:  Never Used   .  Alcohol Use:  No    Allergies   Allergen  Reactions   .  Codeine Phosphate      REACTION: difficulty breathing   .  Metoprolol Tartrate      REACTION: loss of vision    Current Outpatient Prescriptions   Medication  Sig  Dispense  Refill   .  Ascorbic Acid (VITAMIN C) 500 MG tablet  Take 500 mg by mouth daily.     Marland Kitchen  aspirin 81 MG tablet  Take 81 mg by mouth daily.     .  Calcium Carbonate (CALCIUM 500 PO)  Take 1 tablet by mouth daily.     .  furosemide (LASIX) 40 MG tablet  TAKE ONE TABLET BY MOUTH TWICE DAILY  180 tablet  3   .  Glucosamine-Chondroitin 250-200 MG CAPS  Take 1 capsule by mouth daily.     Marland Kitchen  loratadine (CLARITIN) 10 MG tablet  Take 10 mg by mouth daily.     .  Meclizine HCl (BONINE) 25 MG CHEW  Chew 1 tablet by mouth daily.     .  metoprolol succinate (TOPROL-XL) 100 MG 24 hr tablet  TAKE ONE TABLET BY MOUTH EVERY DAY  90 tablet  1   .  Multiple Vitamin (MULTIVITAMIN) tablet  Take 1 tablet by mouth daily.     .  nitroGLYCERIN (NITROSTAT) 0.4 MG SL tablet  Place 1 tablet (0.4 mg total) under the tongue every 5 (five) minutes as needed. For chest pain  30 tablet  1   .  Omega-3 Fatty Acids (FISH OIL) 1000 MG CAPS  Take 1,000 mg by mouth 2 (two) times daily.     .  potassium chloride (KLOR-CON M10) 10 MEQ tablet  Take 1 tablet (10 mEq total) by mouth 2 (two) times daily.  180 tablet  3   .  pravastatin (PRAVACHOL) 40 MG tablet  TAKE ONE TABLET BY MOUTH EVERY DAY IN THE EVENING  90 tablet  1   .  ramipril (ALTACE) 10 MG capsule  Take 1 capsule (10 mg total) by mouth daily.  90 capsule  6   .  tadalafil (CIALIS) 10 MG tablet   Take 1 tablet (10 mg total) by mouth daily as needed.  10 tablet  6   .  traMADol (ULTRAM) 50 MG tablet      .  vitamin B-12 (CYANOCOBALAMIN) 100 MCG tablet  Take 100 mcg by mouth daily.     Marland Kitchen  warfarin (COUMADIN) 10 MG tablet  Take 5-10 mg by mouth daily. Take 10 mg everyday except Monday take 5 mg     .  [DISCONTINUED] potassium chloride (KLOR-CON 10) 10 MEQ CR tablet  Take 1 tablet (10 mEq total) by mouth 2 (two) times daily.  180 tablet  6    No current facility-administered medications for this visit.   Review of Systems  Review of Systems  Constitutional: Negative for fever, chills and unexpected weight change.  HENT: Negative for hearing loss, congestion, sore throat, trouble swallowing and voice change.  Eyes: Negative for visual disturbance.  Respiratory: Negative for cough and wheezing.  Cardiovascular: Negative for chest pain, palpitations and leg swelling.  Gastrointestinal: Negative for nausea, vomiting, abdominal pain, diarrhea, constipation, blood in stool, abdominal distention, anal bleeding and rectal pain.  Genitourinary: Negative for hematuria and difficulty urinating.  Musculoskeletal: Negative for arthralgias.  Skin: Negative for rash and wound.  Neurological: Negative for seizures, syncope, weakness and headaches.  Hematological: Negative for adenopathy. Does not bruise/bleed easily.  Psychiatric/Behavioral: Negative for confusion.  Blood pressure 136/70, pulse 60, temperature 97 F (36.1 C), temperature source Oral, resp. rate 16, height 5' 7.75" (1.721 m), weight 231 lb 9.6 oz (105.053 kg).  Physical Exam  Physical Exam  WDWN in NAD  HEENT: EOMI, sclera anicteric  Neck: No masses, no thyromegaly  Lungs: CTA bilaterally; normal respiratory effort  CV: Regular rate and rhythm; no murmurs  Abd: +bowel sounds, soft, non-tender, no masses  GU: Healed right inguinal incision - palpable hernia - reducible; Small reducible left inguinal hernia  Ext: Well-perfused; no  edema  Skin: Warm, dry; no sign of jaundice  Data Reviewed  none  Assessment  Recurrent right inguinal hernia  Left inguinal hernia  Plan  Laparoscopic bilateral inguinal hernia repairs with mesh. The surgical procedure has been discussed with the patient. Potential risks, benefits, alternative treatments, and expected outcomes have been explained. All of the patient's questions at this time have been answered. The likelihood of reaching the patient's treatment goal is good. The patient understand the proposed surgical procedure and wishes to proceed.  Wilmon Arms. Corliss Skains, MD, [facility]  General/ Trauma Surgery  02/07/2013 11:06 AM

## 2013-02-08 ENCOUNTER — Encounter (HOSPITAL_COMMUNITY): Payer: Self-pay | Admitting: Surgery

## 2013-02-09 ENCOUNTER — Encounter (HOSPITAL_COMMUNITY): Payer: Self-pay | Admitting: Emergency Medicine

## 2013-02-09 ENCOUNTER — Emergency Department (HOSPITAL_COMMUNITY): Payer: Medicare Other

## 2013-02-09 ENCOUNTER — Ambulatory Visit (HOSPITAL_COMMUNITY): Payer: Medicare Other

## 2013-02-09 ENCOUNTER — Telehealth (INDEPENDENT_AMBULATORY_CARE_PROVIDER_SITE_OTHER): Payer: Self-pay | Admitting: Surgery

## 2013-02-09 ENCOUNTER — Inpatient Hospital Stay (HOSPITAL_COMMUNITY)
Admission: EM | Admit: 2013-02-09 | Discharge: 2013-02-23 | DRG: 919 | Disposition: A | Discharge status: Home / Self Care | Payer: Medicare Other | Attending: Surgery | Admitting: Surgery

## 2013-02-09 DIAGNOSIS — K219 Gastro-esophageal reflux disease without esophagitis: Secondary | ICD-10-CM | POA: Diagnosis present

## 2013-02-09 DIAGNOSIS — R34 Anuria and oliguria: Secondary | ICD-10-CM | POA: Diagnosis present

## 2013-02-09 DIAGNOSIS — K560 Paralytic ileus: Secondary | ICD-10-CM | POA: Diagnosis not present

## 2013-02-09 DIAGNOSIS — IMO0001 Reserved for inherently not codable concepts without codable children: Secondary | ICD-10-CM

## 2013-02-09 DIAGNOSIS — Z6839 Body mass index (BMI) 39.0-39.9, adult: Secondary | ICD-10-CM

## 2013-02-09 DIAGNOSIS — I2510 Atherosclerotic heart disease of native coronary artery without angina pectoris: Secondary | ICD-10-CM

## 2013-02-09 DIAGNOSIS — I359 Nonrheumatic aortic valve disorder, unspecified: Secondary | ICD-10-CM

## 2013-02-09 DIAGNOSIS — R109 Unspecified abdominal pain: Secondary | ICD-10-CM

## 2013-02-09 DIAGNOSIS — I959 Hypotension, unspecified: Secondary | ICD-10-CM

## 2013-02-09 DIAGNOSIS — I779 Disorder of arteries and arterioles, unspecified: Secondary | ICD-10-CM

## 2013-02-09 DIAGNOSIS — I442 Atrioventricular block, complete: Secondary | ICD-10-CM

## 2013-02-09 DIAGNOSIS — D62 Acute posthemorrhagic anemia: Secondary | ICD-10-CM

## 2013-02-09 DIAGNOSIS — D72829 Elevated white blood cell count, unspecified: Secondary | ICD-10-CM

## 2013-02-09 DIAGNOSIS — Z7901 Long term (current) use of anticoagulants: Secondary | ICD-10-CM

## 2013-02-09 DIAGNOSIS — E875 Hyperkalemia: Secondary | ICD-10-CM

## 2013-02-09 DIAGNOSIS — IMO0002 Reserved for concepts with insufficient information to code with codable children: Principal | ICD-10-CM | POA: Diagnosis present

## 2013-02-09 DIAGNOSIS — R739 Hyperglycemia, unspecified: Secondary | ICD-10-CM

## 2013-02-09 DIAGNOSIS — R578 Other shock: Secondary | ICD-10-CM | POA: Diagnosis present

## 2013-02-09 DIAGNOSIS — Z952 Presence of prosthetic heart valve: Secondary | ICD-10-CM

## 2013-02-09 DIAGNOSIS — D649 Anemia, unspecified: Secondary | ICD-10-CM

## 2013-02-09 DIAGNOSIS — I350 Nonrheumatic aortic (valve) stenosis: Secondary | ICD-10-CM

## 2013-02-09 DIAGNOSIS — Y838 Other surgical procedures as the cause of abnormal reaction of the patient, or of later complication, without mention of misadventure at the time of the procedure: Secondary | ICD-10-CM | POA: Diagnosis present

## 2013-02-09 DIAGNOSIS — E872 Acidosis, unspecified: Secondary | ICD-10-CM | POA: Diagnosis present

## 2013-02-09 DIAGNOSIS — R339 Retention of urine, unspecified: Secondary | ICD-10-CM | POA: Diagnosis present

## 2013-02-09 DIAGNOSIS — Z954 Presence of other heart-valve replacement: Secondary | ICD-10-CM

## 2013-02-09 DIAGNOSIS — R5383 Other fatigue: Secondary | ICD-10-CM | POA: Diagnosis present

## 2013-02-09 DIAGNOSIS — N179 Acute kidney failure, unspecified: Secondary | ICD-10-CM

## 2013-02-09 DIAGNOSIS — R609 Edema, unspecified: Secondary | ICD-10-CM

## 2013-02-09 DIAGNOSIS — Z950 Presence of cardiac pacemaker: Secondary | ICD-10-CM

## 2013-02-09 DIAGNOSIS — E785 Hyperlipidemia, unspecified: Secondary | ICD-10-CM | POA: Diagnosis present

## 2013-02-09 DIAGNOSIS — Z87891 Personal history of nicotine dependence: Secondary | ICD-10-CM

## 2013-02-09 DIAGNOSIS — Z951 Presence of aortocoronary bypass graft: Secondary | ICD-10-CM

## 2013-02-09 DIAGNOSIS — N4889 Other specified disorders of penis: Secondary | ICD-10-CM | POA: Diagnosis present

## 2013-02-09 DIAGNOSIS — J9600 Acute respiratory failure, unspecified whether with hypoxia or hypercapnia: Secondary | ICD-10-CM | POA: Diagnosis present

## 2013-02-09 DIAGNOSIS — N170 Acute kidney failure with tubular necrosis: Secondary | ICD-10-CM | POA: Diagnosis present

## 2013-02-09 DIAGNOSIS — E236 Other disorders of pituitary gland: Secondary | ICD-10-CM | POA: Diagnosis present

## 2013-02-09 DIAGNOSIS — R7302 Impaired glucose tolerance (oral): Secondary | ICD-10-CM

## 2013-02-09 DIAGNOSIS — I10 Essential (primary) hypertension: Secondary | ICD-10-CM

## 2013-02-09 DIAGNOSIS — R5381 Other malaise: Secondary | ICD-10-CM | POA: Diagnosis present

## 2013-02-09 HISTORY — DX: Acute kidney failure, unspecified: N17.9

## 2013-02-09 LAB — COMPREHENSIVE METABOLIC PANEL
ALT: 35 U/L (ref 0–53)
AST: 28 U/L (ref 0–37)
Albumin: 3.2 g/dL — ABNORMAL LOW (ref 3.5–5.2)
CO2: 23 mEq/L (ref 19–32)
Calcium: 8.2 mg/dL — ABNORMAL LOW (ref 8.4–10.5)
Chloride: 102 mEq/L (ref 96–112)
GFR calc non Af Amer: 30 mL/min — ABNORMAL LOW (ref >90)
Sodium: 137 mEq/L (ref 135–145)
Total Bilirubin: 0.3 mg/dL (ref 0.3–1.2)

## 2013-02-09 LAB — LIPASE, BLOOD: Lipase: 14 U/L (ref 11–59)

## 2013-02-09 LAB — CBC WITH DIFFERENTIAL/PLATELET
Basophils Relative: 0 % (ref 0–1)
Eosinophils Absolute: 0 10*3/uL (ref 0.0–0.7)
Eosinophils Relative: 0 % (ref 0–5)
Hemoglobin: 9.9 g/dL — ABNORMAL LOW (ref 13.0–17.0)
Lymphs Abs: 1.5 10*3/uL (ref 0.7–4.0)
MCH: 29.7 pg (ref 26.0–34.0)
MCHC: 33.2 g/dL (ref 30.0–36.0)
MCV: 89.5 fL (ref 78.0–100.0)
Monocytes Absolute: 1.3 10*3/uL — ABNORMAL HIGH (ref 0.1–1.0)
Monocytes Relative: 6 % (ref 3–12)
Neutrophils Relative %: 87 % — ABNORMAL HIGH (ref 43–77)

## 2013-02-09 LAB — URINE MICROSCOPIC-ADD ON

## 2013-02-09 LAB — HEMOGLOBIN AND HEMATOCRIT, BLOOD: HCT: 29.5 % — ABNORMAL LOW (ref 39.0–52.0)

## 2013-02-09 LAB — PROTIME-INR
INR: 1.12 (ref 0.00–1.49)
Prothrombin Time: 14.2 seconds (ref 11.6–15.2)
Prothrombin Time: 13.9 seconds (ref 11.6–15.2)

## 2013-02-09 LAB — URINALYSIS, ROUTINE W REFLEX MICROSCOPIC
Glucose, UA: NEGATIVE mg/dL
Leukocytes, UA: NEGATIVE
Nitrite: NEGATIVE
pH: 5 (ref 5.0–8.0)

## 2013-02-09 MED ORDER — NITROGLYCERIN 0.4 MG SL SUBL: 0.4000 mg | SUBLINGUAL_TABLET | SUBLINGUAL | Status: DC | PRN

## 2013-02-09 MED ORDER — ACETAMINOPHEN 325 MG PO TABS
650.0000 mg | ORAL_TABLET | Freq: Four times a day (QID) | ORAL | Status: DC | PRN
  Administered 2013-02-15 – 2013-02-23 (×7): 650 mg via ORAL
  Filled 2013-02-09 (×7): qty 2

## 2013-02-09 MED ORDER — ALUM & MAG HYDROXIDE-SIMETH 200-200-20 MG/5ML PO SUSP
30.0000 mL | ORAL | Status: DC | PRN
Start: 2013-02-09 — End: 2013-02-23
  Administered 2013-02-09 – 2013-02-10 (×2): 30 mL via ORAL
  Filled 2013-02-09 (×2): qty 30

## 2013-02-09 MED ORDER — ACETAMINOPHEN 650 MG RE SUPP: 650.0000 mg | Freq: Four times a day (QID) | RECTAL | Status: DC | PRN

## 2013-02-09 MED ORDER — PANTOPRAZOLE SODIUM 40 MG IV SOLR
40.0000 mg | Freq: Every day | INTRAVENOUS | Status: DC
  Administered 2013-02-09 – 2013-02-11 (×3): 40 mg via INTRAVENOUS
  Filled 2013-02-09 (×5): qty 40

## 2013-02-09 MED ORDER — SODIUM CHLORIDE 0.9 % IV BOLUS (SEPSIS)
1000.0000 mL | Freq: Once | INTRAVENOUS | Status: AC
  Administered 2013-02-09: 1000 mL via INTRAVENOUS

## 2013-02-09 MED ORDER — FENTANYL CITRATE 0.05 MG/ML IJ SOLN
25.0000 ug | Freq: Once | INTRAMUSCULAR | Status: AC
  Administered 2013-02-09: 25 ug via INTRAVENOUS
  Filled 2013-02-09: qty 2

## 2013-02-09 MED ORDER — DIPHENHYDRAMINE HCL 12.5 MG/5ML PO ELIX: 12.5000 mg | ORAL_SOLUTION | Freq: Four times a day (QID) | ORAL | Status: DC | PRN

## 2013-02-09 MED ORDER — IOHEXOL 300 MG/ML  SOLN: 50.0000 mL | Freq: Once | INTRAMUSCULAR | Status: DC | PRN

## 2013-02-09 MED ORDER — MORPHINE SULFATE 2 MG/ML IJ SOLN
1.0000 mg | INTRAMUSCULAR | Status: DC | PRN
  Administered 2013-02-09 (×2): 2 mg via INTRAVENOUS
  Administered 2013-02-10 (×2): 4 mg via INTRAVENOUS
  Administered 2013-02-10: 2 mg via INTRAVENOUS
  Administered 2013-02-10: 4 mg via INTRAVENOUS
  Administered 2013-02-11: 2 mg via INTRAVENOUS
  Administered 2013-02-11: 4 mg via INTRAVENOUS
  Administered 2013-02-11 – 2013-02-12 (×2): 2 mg via INTRAVENOUS
  Administered 2013-02-12 (×2): 4 mg via INTRAVENOUS
  Administered 2013-02-13 – 2013-02-20 (×4): 2 mg via INTRAVENOUS
  Filled 2013-02-09: qty 1
  Filled 2013-02-09: qty 2
  Filled 2013-02-09: qty 1
  Filled 2013-02-09: qty 2
  Filled 2013-02-09 (×2): qty 1
  Filled 2013-02-09 (×2): qty 2
  Filled 2013-02-09 (×2): qty 1
  Filled 2013-02-09: qty 2
  Filled 2013-02-09 (×3): qty 1
  Filled 2013-02-09: qty 2
  Filled 2013-02-09 (×2): qty 1

## 2013-02-09 MED ORDER — ONDANSETRON HCL 4 MG/2ML IJ SOLN
4.0000 mg | Freq: Four times a day (QID) | INTRAMUSCULAR | Status: DC | PRN
  Administered 2013-02-09: 4 mg via INTRAVENOUS
  Filled 2013-02-09: qty 2

## 2013-02-09 MED ORDER — VITAMIN K1 10 MG/ML IJ SOLN
5.0000 mg | Freq: Once | INTRAMUSCULAR | Status: AC
  Administered 2013-02-09: 5 mg via INTRAVENOUS
  Filled 2013-02-09: qty 0.5

## 2013-02-09 MED ORDER — CEFAZOLIN SODIUM-DEXTROSE 2-3 GM-% IV SOLR
2.0000 g | Freq: Three times a day (TID) | INTRAVENOUS | Status: DC
  Administered 2013-02-09 – 2013-02-15 (×17): 2 g via INTRAVENOUS
  Filled 2013-02-09 (×21): qty 50

## 2013-02-09 MED ORDER — DEXTROSE-NACL 5-0.45 % IV SOLN
INTRAVENOUS | Status: DC
  Administered 2013-02-09: 20 mL/h via INTRAVENOUS
  Administered 2013-02-10 (×2): 125 mL/h via INTRAVENOUS
  Administered 2013-02-11 (×2): 100 mL/h via INTRAVENOUS

## 2013-02-09 MED ORDER — SODIUM CHLORIDE 0.9 % IV BOLUS (SEPSIS)
1000.0000 mL | INTRAVENOUS | Status: AC
  Administered 2013-02-09: 1000 mL via INTRAVENOUS

## 2013-02-09 MED ORDER — DIPHENHYDRAMINE HCL 50 MG/ML IJ SOLN: 12.5000 mg | Freq: Four times a day (QID) | INTRAMUSCULAR | Status: DC | PRN

## 2013-02-09 NOTE — ED Notes (Signed)
Pt from home via EMS c/o nausea, abd pain, hematuria, urinary retention s/p hernia repair on Wednesday. Pt reports that he had blood in his urine this am before EMS arrived, but c/o not being able to urinate. Pt A&O and in NAD.

## 2013-02-09 NOTE — ED Notes (Signed)
[id number]> Expected date:<BR> Expected time:<BR> Means of arrival:<BR> Comments:<BR> EMS-post op issues

## 2013-02-09 NOTE — Consult Note (Addendum)
PULMONARY  / CRITICAL CARE MEDICINE  Name: [name] MRN: [id number] DOB: 01-27-1946    ADMISSION DATE:  02/09/2013 CONSULTATION DATE:  02/09/13  REFERRING MD :  Derrell Lolling  PRIMARY SERVICE: CCS/ gen surgery   CHIEF COMPLAINT:  abd pain/ weak  BRIEF PATIENT DESCRIPTION: 35 yowm s/p avr 2005 on chronic coumadin s/p Bil IH 7/30 readmitted am 8/1 with abd pain and weakness and found to have extensive extraperinal abd hematoma and blood loss anemia so PCCM asked to eval pm 8/1 for possible shock with low uop and soft bps  SIGNIFICANT EVENTS / STUDIES:  CT abd 8/1 > Large extraperitoneal hematoma deep to the rectus musculature in  the lower anterior abdominal wall.  Intraperitoneal and extraperitoneal gas in the abdomen and pelvis  tracks up into the lower chest consistent with dissection of gas  from the history of laparoscopic surgery 2 days ago.   LINES / TUBES:    CULTURES:    ANTIBIOTICS: Ancef 8/1 >>    HISTORY OF PRESENT ILLNESS:  66 y/o M with history of CAD s/p 2v CABG 2005 & Bentall aortic root replacement & St. Jude mechanical aortic valve at that time c/b heart block s/p pacemaker, HTN,  On ACEi who presented to [facility] with abdominal pain, anuria, and hypotension. On 02/07/13 he underwent laparoscopic bilateral inguinal hernia repairs. The patient had to be done under a heparin window (INR 0.99 day of surgery) and bridged with Lovenox before & after. He was restarted on his Coumadin the night of surgery. The patient urinated once post-op before going home, but has essentially not voided much since then. He had minimal UOP this AM accompanied with dysuria. He presented with complaints of abdominal pain and pressure in his pelvis. BP at home was 90/60 and he started sweating and felt cold so came to ER at advice of surgical office. Upon arrival, his BP was 70/40s which responded with IVF. A foley catheter was placed and he has only had a total of 50cc of urine   PAST MEDICAL  HISTORY :  Past Medical History  Diagnosis Date  . Aortic stenosis 08/25/2010    a. Bentall aortic root replacement with a St. Jude mechanical valve and Hemashield conduit 02/2004.  . AV BLOCK, COMPLETE     a. s/p St Jude dual chamber pacemaker 02/2004.  Marland Kitchen CAD (coronary artery disease)     a. s/p CABGx2 (LIMA-dLAD, SVG-Cx). b. Low risk nuc 12/2012 without ischemia, EF 46% mild apical hypokinesia (EF 55% inf HK by echo).  . DIVERTICULOSIS, COLON 10/17/2007  . GOUT 01/16/2007  . HEMORRHOIDS, INTERNAL 11/01/2008  . HYPERLIPIDEMIA 01/16/2007  . HYPERTENSION 01/16/2007  . LEG EDEMA 11/27/2008  . Impaired glucose tolerance   . Carotid artery disease     a. 0-39% bilateral ICA stenosis, stable mild hard plaque in carotid bulbs. F/u 03/2014 recommended.  Marland Kitchen Special screening for malignant neoplasm of prostate   . Warfarin anticoagulation     AVR  . Ejection fraction     a. EF 55% with inf HK, mild MR by echo 12/2012.  . Asthma   . Arthritis   . GERD (gastroesophageal reflux disease)     hxof    Past Surgical History  Procedure Laterality Date  . Coronary artery bypass graft    . Aortic valve replacement    . Pacemaker insertion    . Shoulder surgery    . Cardiac catheterization  06/2003  . Circumcision    . Colonoscopy    .  Hernia repair    . Coronary stents       prior to bypass   . Inguinal hernia repair Bilateral 02/07/2013    Procedure: LAPAROSCOPIC BILATERAL INGUINAL HERNIA REPAIR;  Surgeon: Wilmon Arms. Corliss Skains, MD;  Location: [facility];  Service: General;  Laterality: Bilateral;  . Insertion of mesh Bilateral 02/07/2013    Procedure: INSERTION OF MESH;  Surgeon: Wilmon Arms. Corliss Skains, MD;  Location: [facility];  Service: General;  Laterality: Bilateral;   Prior to Admission medications   Medication Sig Start Date End Date Taking? Authorizing Provider  Ascorbic Acid (VITAMIN C) 500 MG tablet Take 1,000 mg by mouth daily.    Yes Historical Provider, MD  aspirin 81 MG tablet Take 81 mg by mouth daily.      Yes Historical Provider, MD  calcium carbonate (OS-CAL) 600 MG TABS Take 600 mg by mouth 2 (two) times daily with a meal.   Yes Historical Provider, MD  enoxaparin (LOVENOX) 100 MG/ML injection Inject 1 mL (100 mg total) into the skin every 12 (twelve) hours. 02/03/13  Yes Luis Abed, MD  furosemide (LASIX) 40 MG tablet Take 40 mg by mouth 2 (two) times daily.   Yes Historical Provider, MD  Glucosamine-Chondroitin 250-200 MG CAPS Take 2 capsules by mouth daily.    Yes Historical Provider, MD  loratadine (CLARITIN) 10 MG tablet Take 10 mg by mouth daily.   Yes Historical Provider, MD  Meclizine HCl (BONINE) 25 MG CHEW Chew 1 tablet by mouth daily.     Yes Historical Provider, MD  metoprolol succinate (TOPROL-XL) 100 MG 24 hr tablet Take 100 mg by mouth every morning. Take with or immediately following a meal.   Yes Historical Provider, MD  Misc Natural Products (TART CHERRY ADVANCED) CAPS Take 1 capsule by mouth every evening.   Yes Historical Provider, MD  Multiple Vitamin (MULTIVITAMIN) tablet Take 1 tablet by mouth daily.     Yes Historical Provider, MD  nitroGLYCERIN (NITROSTAT) 0.4 MG SL tablet Place 1 tablet (0.4 mg total) under the tongue every 5 (five) minutes as needed. For chest pain 09/19/12  Yes Gordy Savers, MD  Omega-3 Fatty Acids (FISH OIL) 1000 MG CAPS Take 1,000 mg by mouth every morning.    Yes Historical Provider, MD  potassium chloride (KLOR-CON M10) 10 MEQ tablet Take 1 tablet (10 mEq total) by mouth 2 (two) times daily. 09/19/12  Yes Gordy Savers, MD  pravastatin (PRAVACHOL) 40 MG tablet Take 40 mg by mouth every evening.    Yes Historical Provider, MD  ramipril (ALTACE) 10 MG capsule Take 10 mg by mouth every morning.   Yes Historical Provider, MD  tadalafil (CIALIS) 10 MG tablet Take 1 tablet (10 mg total) by mouth daily as needed. 04/10/12  Yes Gordy Savers, MD  traMADol (ULTRAM) 50 MG tablet Take 50 mg by mouth every 6 (six) hours as needed for pain.   07/26/12  Yes Historical Provider, MD  vitamin B-12 (CYANOCOBALAMIN) 1000 MCG tablet Take 1,000 mcg by mouth daily.   Yes Historical Provider, MD  warfarin (COUMADIN) 10 MG tablet Take 5-10 mg by mouth every evening. Takes 5 mg on Monday and 10 mg all the other days   Yes Historical Provider, MD   Allergies  Allergen Reactions  . Codeine Phosphate     REACTION: difficulty breathing  . Metoprolol Tartrate     REACTION: loss of vision    FAMILY HISTORY:  Family History  Problem Relation Age of  Onset  . Heart attack Father   . Cancer Father     renal cancer  . Diabetes Brother   . Hypertension Mother   . Diabetes Mother    SOCIAL HISTORY:  reports that he quit smoking about 39 years ago. He has never used smokeless tobacco. He reports that he does not drink alcohol or use illicit drugs.  REVIEW OF SYSTEMS:   Taken in detail and all systems neg x above. Good appetite 7/31 none now but no n or v   SUBJECTIVE:  No sob/ c/o abd pain  VITAL SIGNS: Temp:  [97.4 F (36.3 C)-98.5 F (36.9 C)] 98.5 F (36.9 C) (08/01 1800) Pulse Rate:  [78-99] 93 (08/01 1802) Resp:  [12-23] 19 (08/01 1802) BP: (66-133)/(44-71) 132/53 mmHg (08/01 1802) SpO2:  [91 %-100 %] 100 % (08/01 1700) Weight:  [219 lb (99.338 kg)-238 lb 1.6 oz (108 kg)] 238 lb 1.6 oz (108 kg) (08/01 1402) FIO2  2lpm NP  HEMODYNAMICS:   VENTILATOR SETTINGS:   INTAKE / OUTPUT: Intake/Output     07/31 0701 - 08/01 0700 08/01 0701 - 08/02 0700   P.O.  50   I.V. (mL/kg)  925 (8.6)   Blood  1342.5   IV Piggyback  50   Total Intake(mL/kg)  2367.5 (21.9)   Urine (mL/kg/hr)  200   Total Output   200   Net   +2167.5          PHYSICAL EXAMINATION: General:  Uncomfortable, mild increased wob Neuro:  Alert, nodf HEENT:  Supple neck, no jvd / no nodes Cardiovascular:  RRR mechanical s2 Lungs:  Clear to A and P Abdomen:  Mod distended, tender diffusely mild rebound Musculoskeletal:  No deformity Skin:  Would looks good    LABS:  CBC Recent Labs     02/09/13  1058  02/09/13  1400  WBC  21.3*   --   HGB  9.9*  9.1*  HCT  29.8*  28.0*  PLT  213   --    Coag's Recent Labs     02/07/13  1103  02/09/13  1040  APTT  28   --   INR  0.99  1.12   BMET Recent Labs     02/09/13  1058  NA  137  K  4.8  CL  102  CO2  23  BUN  40*  CREATININE  2.17*  GLUCOSE  197*   Electrolytes Recent Labs     02/09/13  1058  CALCIUM  8.2*   Sepsis Markers No results found for this basename: LACTICACIDVEN, PROCALCITON, O2SATVEN,  in the last 72 hours ABG No results found for this basename: PHART, PCO2ART, PO2ART,  in the last 72 hours Liver Enzymes Recent Labs     02/09/13  1058  AST  28  ALT  35  ALKPHOS  41  BILITOT  0.3  ALBUMIN  3.2*   Cardiac Enzymes No results found for this basename: TROPONINI, PROBNP,  in the last 72 hours Glucose No results found for this basename: GLUCAP,  in the last 72 hours  Imaging Ct Abdomen Pelvis Wo Contrast  02/09/2013   *RADIOLOGY REPORT*  Clinical Data: Status post laparoscopic inguinal hernia repair 2 days ago.  Now with low abdominal pain and urinary retention.  CT ABDOMEN AND PELVIS WITHOUT CONTRAST  Technique:  Multidetector CT imaging of the abdomen and pelvis was performed following the standard protocol without intravenous contrast.  Comparison: None.  Findings: Several calcified  granulomas are seen within the liver parenchyma.  Calcified granuloma also noted in the spleen.  There is a trace amount of perihepatic and perisplenic ascites.  The stomach, duodenum, pancreas, the and adrenal glands are unremarkable.  The gallbladder is distended.  There is some layering sludge or stones in the lumen of the gallbladder.  Right kidney is unremarkable.  2.0 cm water density lesion in the left kidney is compatible with a cyst.  No abdominal aortic aneurysm.  No evidence for lymphadenopathy in the abdomen.  The patient has free gas in the abdomen and lower thorax.  Gas  is seen in the intraperitoneal space as well as extraperitoneal compartments of the anterior abdominal wall.  Imaging through the pelvis reveals a large 16.9 x 9.6 x 12.4 cm heterogeneous complex fluid collection in the anterior pelvis, consistent with hematoma.  This hematoma is deep to the rectus musculature and given the mass effect on the bladder and surgical history, it is most likely in the preperitoneal space.  Some blood products are seen tracking into the inguinal regions bilaterally, right greater than left.  There is edema/hemorrhage tracking in the pelvic floor posteriorly to the presacral space in the the patient has some subcutaneous edema in the lower anterior abdominal wall, compatible with recent surgery.  Foley catheter decompresses the urinary bladder.  Terminal ileum is normal. The appendix is not visualized, but there is no edema or inflammation in the region of the cecum.  Bone windows reveal no worrisome lytic or sclerotic osseous lesions.  IMPRESSION: Large extraperitoneal hematoma deep to the rectus musculature in the lower anterior abdominal wall.  Intraperitoneal and extraperitoneal gas in the abdomen and pelvis tracks up into the lower chest consistent with dissection of gas from the history of laparoscopic surgery 2 days ago.   Original Report Authenticated By: Kennith Center, M.D.   Dg Chest Port 1 View  02/09/2013   *RADIOLOGY REPORT*  Clinical Data: Postop hernia surgery  PORTABLE CHEST - 1 VIEW  Comparison: 01/31/2013  Findings: Dual lead right subclavian pacemaker device and leads are stable.  Normal heart size.  Lungs under aerated and clear.  No pneumothorax.  No pleural fluid.  IMPRESSION: No active cardiopulmonary disease.   Original Report Authenticated By: Jolaine Click, M.D.     CXR:  n/a  ASSESSMENT / PLAN:  PULMONARY A: Hypoxemic resp failure with poor Cabd  -rx 02  CARDIOVASCULAR A: Hypovolemia shock secondary to blood loss s/p AVR/ paced rhythm P vol expand  with prbc's and ffp  RENAL Lab Results  Component Value Date   CREATININE 2.17* 02/09/2013   CREATININE 0.91 01/31/2013   CREATININE 0.9 10/02/2012    A:  Acute renal failure, oliguric, on ACEi on admit P:  Hold acei, vol expand with prbc's and ffp    GASTROINTESTINAL A:  ? Developing ileus post op       Abd wall hematoma P:  Judicious analgesics  HEMATOLOGIC  Recent Labs Lab 02/09/13 1058 02/09/13 1400  HGB 9.9* 9.1*    Lab Results  Component Value Date   INR 1.12 02/09/2013   INR 0.99 02/07/2013   INR 1.67* 01/31/2013   PROTIME 19.0 12/26/2008   PROTIME 25.1 12/16/2008    A:  Acute blood loss anemia, coagulopathy corrected P:  Transfusions ordered, f/u serial hgb  INFECTIOUS A:  No issues P:   Ancef rx per CCS     I have personally obtained a history, examined the patient, evaluated laboratory and imaging results,  formulated the assessment and plan and placed orders. CRITICAL CARE: The patient is critically ill with multiple organ systems failure and requires high complexity decision making for assessment and support, frequent evaluation and titration of therapies, application of advanced monitoring technologies and extensive interpretation of multiple databases. Critical Care Time devoted to patient care services described in this note is 40 minutes.      Sandrea Hughs, MD Pulmonary and Critical Care Medicine [facility] Cell [phone] After 5:30 PM or weekends, call [phone]

## 2013-02-09 NOTE — Progress Notes (Signed)
UR completed 

## 2013-02-09 NOTE — Telephone Encounter (Signed)
Ms. Tugwell called me this morning (6:30AM) about her husband.  Over the last 12 hours he has felt cold, his BP at home is 90/60, and he gets light headed when he stands.  His is 2 days out from a lap bil ing hernia repair and on anti coagulation.  I advised her to go to the [facility] to be checked out.

## 2013-02-09 NOTE — ED Notes (Signed)
Tech Verlon Au unable to obtain accurate bladder scan d/t surgical sites and discomfort to pt.Received verbal to proceed with foley per Dr. Romeo Apple

## 2013-02-09 NOTE — Progress Notes (Addendum)
General surgery attending followup note:  Patient's blood pressure has been a little soft, with systolics sometimes below 80. BP back up to 115 systolic now. Abdominal exam reveals continued tenderness, perhaps a little bit higher in the abdomen that it was before. He is not diaphoretic. Hemoglobin went from 9.9 this morning to 9.1 at 2 PM, so the numbers suggest that he is not actively bleeding. He has not received PRBC yet.I suspect he is still somewhat hypovolemic.  Patient has been evaluated by cardiology. They feel that his heart and heart valve are functioning normally.  Plan: We will give 2 units of PRBCs now in hopes of further volume expanding him. If  he remains stable we will follow serial H&H's CCM to get involved.  If he becomes unstable again then we will do a CT angiogram to see if there is a site of extravasation, with the thought that he could then go directly to interventional radiology for angioembolization if there is a bleeding site. This was discussed with Dr. Denny Levy in radiology, who will alert his colleague on call tonight. This was discussed with the family.  The air and I  Angelia Mould. Derrell Lolling, M.D., [facility] General and Minimally invasive Surgery Breast and Colorectal Surgery Office:   [phone] Pager:   [phone]

## 2013-02-09 NOTE — H&P (Signed)
[name] [birth year]  [id number].   Primary Care MD: Dr. [name] Chief Complaint/Reason for Consult: abdominal pain with hypotension s/p lap bilateral inguinal hernia repairs HPI: This is a 67 yo male with multiple cardiac issues who underwent a laparoscopic bilateral inguinal hernia repair by Dr. Corliss Skains on Wednesday 02-07-13.  Due to an aortic valve replacement, the patient had to be done under a Heparin window and was restarted on his coumadin the night of surgery along with Lovenox injections.  The patient urinated once post op before going home, but has essentially not voided since then.  He is eating ok and moving his bowels.  No nausea.  He c/o abdominal pain and pressure in his pelvis.  This morning, he was noted to be hypotensive at home with a BP of 90/60s.  He starting sweating and becoming quite cold.  His wife called our office who referred him to [facility].  Upon arrival, his BP was 70/40s.  He has responded with some IVFs.  A foley catheter was placed and he has only had a total of 50cc of urine.  The patient denies any chest pain, SOB.  Currently, all labs and diagnostic studies are pending.  Review of Systems: Please see HPI, otherwise all other systems are negative  Family History  Problem Relation Age of Onset  . Heart attack Father   . Cancer Father     renal cancer  . Diabetes Brother   . Hypertension Mother   . Diabetes Mother     Past Medical History  Diagnosis Date  . Aortic stenosis 08/25/2010    AVR  2005  . AORTIC VALVE REPLACEMENT, HX OF 05/23/2009    2005   Bental with aortic root conduit and mechanical AVR  . AV BLOCK, COMPLETE 11/01/2008    PTVDP  02/2004  . CAD (coronary artery disease) 01/16/2007    CABG  2005  . DIVERTICULOSIS, COLON 10/17/2007  . DIZZINESS 06/19/2009  . GOUT 01/16/2007  . HEMORRHOIDS, INTERNAL 11/01/2008  . HYPERLIPIDEMIA 01/16/2007  . HYPERTENSION 01/16/2007  . LEG EDEMA 11/27/2008  . PACEMAKER, PERMANENT 11/01/2008    Dr Ladona Ridgel 02/2004   . Impaired glucose tolerance   . Carotid artery disease     minimal, doppler 2005  . Special screening for malignant neoplasm of prostate   . Warfarin anticoagulation     AVR  . Ejection fraction     50-55%, echo, 09/2009, inferior/posterior,apical hypo  . Hx of CABG     2005  . Asthma   . Arthritis   . Pacemaker   . Heart murmur     hx of   . GERD (gastroesophageal reflux disease)     hxof     Past Surgical History  Procedure Laterality Date  . Coronary artery bypass graft    . Aortic valve replacement    . Pacemaker insertion    . Shoulder surgery    . Cardiac catheterization  06/2003  . Circumcision    . Colonoscopy    . Hernia repair    . Coronary stents       prior to bypass   . Inguinal hernia repair Bilateral 02/07/2013    Procedure: LAPAROSCOPIC BILATERAL INGUINAL HERNIA REPAIR;  Surgeon: Wilmon Arms. Corliss Skains, MD;  Location: [facility];  Service: General;  Laterality: Bilateral;  . Insertion of mesh Bilateral 02/07/2013    Procedure: INSERTION OF MESH;  Surgeon: Wilmon Arms. Corliss Skains, MD;  Location: [facility];  Service: General;  Laterality: Bilateral;  Social History:  reports that he quit smoking about 39 years ago. He has never used smokeless tobacco. He reports that he does not drink alcohol or use illicit drugs.  Allergies:  Allergies  Allergen Reactions  . Codeine Phosphate     REACTION: difficulty breathing  . Metoprolol Tartrate     REACTION: loss of vision     (Not in a hospital admission)  Blood pressure 99/56, pulse 78, temperature 97.6 F (36.4 C), temperature source Oral, resp. rate 19, height 5' 7.75" (1.721 m), weight 219 lb (99.338 kg), SpO2 97.00%. Physical Exam: General: pleasant, obese white male who is laying in bed in mild distress secondary to being chilled with occasional shivers HEENT: head is normocephalic, atraumatic.  Sclera are noninjected.  PERRL.  Ears and nose without any masses or lesions.  Mouth is pink, but dry. Heart: regular, rate,  and rhythm (paced).  Normal s1,s2. No obvious murmurs, gallops, or rubs noted, however, artifical valve is audible.  Palpable radial and pedal pulses bilaterally Lungs: CTAB, no wheezes, rhonchi, or rales noted.  Respiratory effort nonlabored Abd: soft, tender diffusely, but greatest in lower abdomen where he does appear somewhat distended and a little more tight than his upper abdomen.  He has some ecchymosis noted across his entire lower abdomen, but not too severe at this time.  His incisions are all c/d/iw with steri-strips in place. hypoactive BS, no masses, hernias, or organomegaly GU: some scrotal and penile ecchymosis, but not dependent edema noted MS: all 4 extremities are symmetrical with no cyanosis, clubbing, or edema. Skin: warm and dry with no masses, lesions, or rashes.  Feet are cool Psych: A&Ox3 with an appropriate affect.    Results for orders placed during the hospital encounter of 02/09/13 (from the past 48 hour(s))  URINALYSIS, ROUTINE W REFLEX MICROSCOPIC     Status: Abnormal   Collection Time    02/09/13 10:12 AM      Result Value Range   Color, Urine AMBER (*) YELLOW   Comment: BIOCHEMICALS MAY BE AFFECTED BY COLOR   APPearance CLOUDY (*) CLEAR   Specific Gravity, Urine 1.027  1.005 - 1.030   pH 5.0  5.0 - 8.0   Glucose, UA NEGATIVE  NEGATIVE mg/dL   Hgb urine dipstick NEGATIVE  NEGATIVE   Bilirubin Urine SMALL (*) NEGATIVE   Ketones, ur NEGATIVE  NEGATIVE mg/dL   Protein, ur 30 (*) NEGATIVE mg/dL   Urobilinogen, UA 1.0  0.0 - 1.0 mg/dL   Nitrite NEGATIVE  NEGATIVE   Leukocytes, UA NEGATIVE  NEGATIVE  URINE MICROSCOPIC-ADD ON     Status: Abnormal   Collection Time    02/09/13 10:12 AM      Result Value Range   Squamous Epithelial / LPF FEW (*) RARE   Bacteria, UA RARE  RARE   Casts HYALINE CASTS (*) NEGATIVE   Urine-Other MUCOUS PRESENT     No results found.     Assessment/Plan 1. Abdominal pain, s/p lap bilateral inguinal hernia repairs, POD#2 2.  Hypotension, improving with IVFs 3. Suspected hematoma 4. Anuria, 50cc only present after insertion of foley Patient Active Problem List   Diagnosis Date Noted  . Abdominal pain 02/09/2013  . Anuria 02/09/2013  . Preop cardiovascular exam 01/23/2013  . Mitral regurgitation 01/21/2013  . Recurrent right inguinal hernia 01/02/2013  . Left inguinal hernia 01/02/2013  . Impaired glucose tolerance   . Carotid artery disease   . Warfarin anticoagulation   . Ejection fraction   .  Hx of CABG   . Aortic stenosis 08/25/2010  . DIZZINESS 06/19/2009  . AORTIC VALVE REPLACEMENT, HX OF 05/23/2009  . LEG EDEMA 11/27/2008  . AV BLOCK, COMPLETE 11/01/2008  . PACEMAKER, PERMANENT 11/01/2008  . DIVERTICULOSIS, COLON 10/17/2007  . GOUT 01/16/2007  . CAD (coronary artery disease) 01/16/2007  . HYPERLIPIDEMIA 01/16/2007  . HYPERTENSION 01/16/2007   Plan: 1. Currently awaiting labs to be completed so further workup with a CT scan can be done.  I suspect given his decrease in UOP that we will not be able to use IV contrast for his CT scan. 2. He will be admitted and resuscitated in the SDU. 3. Cardiology will be consulted as patient is high risk for stopping anticoagulation given his mechanical aortic valve; however, I suspect he is anemic secondary to a post operative hematoma.  He will be T&S and may require transfusion. 4. We will also ask for medicine to evaluate the patient to assist with his other medical problems along with his anuria. 5. All anticoagulation held at this point, pending further findings. 6. Further recommendations can be made after more information is obtained. 7. Remain NPO for now.  ADDENDUM: 1. WBC 21K Hgb 9.9 Cr. 2.12 Will get a stat CT scan without contrast to evaluate this hematoma.  Concern for continued active bleeding.  We have 4 units of pRBCs on hold right.  We have d/w pharmacy and they are recommending 2 of FFP for lovenox to see if that will help.  He is becoming  hypotensive again despite all of the fluid he has received.  He has a repeat h/h at 1400pm.    Bular Hickok E 02/09/2013, 10:54 AM Pager: [phone]

## 2013-02-09 NOTE — Progress Notes (Signed)
Patient ID: [name], male   DOB: 12-08-1945, 67 y.o.   MRN: [id number]    Subjective: Mild pain lower abdomen, no other C/O  Objective: Vital signs in last 24 hours: Temp:  [97.4 F (36.3 C)-98.5 F (36.9 C)] 98.5 F (36.9 C) (08/01 1800) Pulse Rate:  [78-99] 97 (08/01 1830) Resp:  [12-23] 22 (08/01 1830) BP: (66-133)/(44-71) 116/66 mmHg (08/01 1830) SpO2:  [91 %-100 %] 100 % (08/01 1830) Weight:  [219 lb (99.338 kg)-238 lb 1.6 oz (108 kg)] 238 lb 1.6 oz (108 kg) (08/01 1402) Last BM Date: 02/09/13  Intake/Output from previous day:   Intake/Output this shift: UOP 200cc past 5 hr    General appearance: alert, cooperative and no distress GI: abnormal findings:  mild tenderness in the lower abdomen and mild bruising  Lab Results:   Recent Labs  02/09/13 1058 02/09/13 1400 02/09/13 1831  WBC 21.3*  --   --   HGB 9.9* 9.1* 9.9*  HCT 29.8* 28.0* 29.5*  PLT 213  --   --    BMET  Recent Labs  02/09/13 1058  NA 137  K 4.8  CL 102  CO2 23  GLUCOSE 197*  BUN 40*  CREATININE 2.17*  CALCIUM 8.2*     Studies/Results: Ct Abdomen Pelvis Wo Contrast  02/09/2013   *RADIOLOGY REPORT*  Clinical Data: Status post laparoscopic inguinal hernia repair 2 days ago.  Now with low abdominal pain and urinary retention.  CT ABDOMEN AND PELVIS WITHOUT CONTRAST  Technique:  Multidetector CT imaging of the abdomen and pelvis was performed following the standard protocol without intravenous contrast.  Comparison: None.  Findings: Several calcified granulomas are seen within the liver parenchyma.  Calcified granuloma also noted in the spleen.  There is a trace amount of perihepatic and perisplenic ascites.  The stomach, duodenum, pancreas, the and adrenal glands are unremarkable.  The gallbladder is distended.  There is some layering sludge or stones in the lumen of the gallbladder.  Right kidney is unremarkable.  2.0 cm water density lesion in the left kidney is compatible with a cyst.   No abdominal aortic aneurysm.  No evidence for lymphadenopathy in the abdomen.  The patient has free gas in the abdomen and lower thorax.  Gas is seen in the intraperitoneal space as well as extraperitoneal compartments of the anterior abdominal wall.  Imaging through the pelvis reveals a large 16.9 x 9.6 x 12.4 cm heterogeneous complex fluid collection in the anterior pelvis, consistent with hematoma.  This hematoma is deep to the rectus musculature and given the mass effect on the bladder and surgical history, it is most likely in the preperitoneal space.  Some blood products are seen tracking into the inguinal regions bilaterally, right greater than left.  There is edema/hemorrhage tracking in the pelvic floor posteriorly to the presacral space in the the patient has some subcutaneous edema in the lower anterior abdominal wall, compatible with recent surgery.  Foley catheter decompresses the urinary bladder.  Terminal ileum is normal. The appendix is not visualized, but there is no edema or inflammation in the region of the cecum.  Bone windows reveal no worrisome lytic or sclerotic osseous lesions.  IMPRESSION: Large extraperitoneal hematoma deep to the rectus musculature in the lower anterior abdominal wall.  Intraperitoneal and extraperitoneal gas in the abdomen and pelvis tracks up into the lower chest consistent with dissection of gas from the history of laparoscopic surgery 2 days ago.   Original Report Authenticated By: Minerva Areola  Molli Posey, M.D.   Dg Chest Port 1 View  02/09/2013   *RADIOLOGY REPORT*  Clinical Data: Postop hernia surgery  PORTABLE CHEST - 1 VIEW  Comparison: 01/31/2013  Findings: Dual lead right subclavian pacemaker device and leads are stable.  Normal heart size.  Lungs under aerated and clear.  No pneumothorax.  No pleural fluid.  IMPRESSION: No active cardiopulmonary disease.   Original Report Authenticated By: Jolaine Click, M.D.    Anti-infectives: Anti-infectives   Start     Dose/Rate  Route Frequency Ordered Stop   02/09/13 1400  ceFAZolin (ANCEF) IVPB 2 g/50 mL premix     2 g 100 mL/hr over 30 Minutes Intravenous 3 times per day 02/09/13 1142        Assessment/Plan: Pre peritoneal bleed post lap IH repair.  Appears stable.  Monitor VS, UOP and hbg closely.      LOS: 0 days    Dwanna Goshert T 02/09/2013

## 2013-02-09 NOTE — Consult Note (Signed)
CARDIOLOGY CONSULT NOTE  Patient ID: [name], MRN: [id number], DOB/AGE: 02-20-1946 67 y.o. Admit date: 02/09/2013   Date of Consult: 02/09/2013 Primary Physician: Rogelia Boga, MD Primary Cardiologist: Myrtis Ser  Chief Complaint: abdominal pain, not urinating Reason for Consult: discuss ongoing anticoagulation in setting of mech AVR (2005), admitted with oliguria/ARF, large extraperitoneal hematoma POD#2 lap bilat inguinal hernia repair, ABL anemia  HPI: [name] is a 67 y/o M with history of CAD s/p 2v CABG 2005 & Bentall aortic root replacement & St. Jude mechanical aortic valve at that time c/b heart block s/p pacemaker, HTN, HL who presented to [facility] with abdominal pain, anuria, and hypotension. On 02/07/13 [name] underwent laparoscopic bilateral inguinal hernia repairs. The patient had to be done under a heparin window (INR 0.99 day of surgery)  and bridged with Lovenox before & after. [name] was restarted on his Coumadin the night of surgery. The patient urinated once post-op before going home, but has essentially not voided much since then. [name] had minimal UOP this AM accompanied with dysuria. [name] presented with complaints of abdominal pain and pressure in his pelvis. BP at home was 90/60 and [name] started sweating and felt cold so came to ER at advice of surgical office. Upon arrival, his BP was 70/40s which responded with IVF. A foley catheter was placed and [name] has only had a total of 50cc of urine. CT abd shows large extraperitoneal hematoma deep to the rectus musculature in the lower anterior abdominal wall, as well as intraperitoneal and extraperitoneal gas in the abdomen and pelvis tracking up into the lower chest consistent with dissection of gas from the history of laparoscopic surgery 2 days ago. [name] denies any CP, SOB, vomiting, syncope, LEE. BUN/Cr 40/2.17 (pre-op 0.91), Hgb went from 15.5(pre)->9.9(today), WBL 21k, INR 1.12, lactic acid 4.2. We are asked to consult regarding need for  ongoing anticogulation in the setting of AVR. Tentative plan per surgery appears to be volume resuscitation with crystalloid, pRBC, FFP. They do not presently feel that [name] will require surgical intervention for his hematoma.  Past Medical History  Diagnosis Date  . Aortic stenosis 08/25/2010    a. Bentall aortic root replacement with a St. Jude mechanical valve and Hemashield conduit 02/2004.  . AV BLOCK, COMPLETE     a. s/p St Jude dual chamber pacemaker 02/2004.  Marland Kitchen CAD (coronary artery disease)     a. s/p CABGx2 (LIMA-dLAD, SVG-Cx). b. Low risk nuc 12/2012 without ischemia, EF 46% mild apical hypokinesia (EF 55% inf HK by echo).  . DIVERTICULOSIS, COLON 10/17/2007  . GOUT 01/16/2007  . HEMORRHOIDS, INTERNAL 11/01/2008  . HYPERLIPIDEMIA 01/16/2007  . HYPERTENSION 01/16/2007  . LEG EDEMA 11/27/2008  . Impaired glucose tolerance   . Carotid artery disease     a. 0-39% bilateral ICA stenosis, stable mild hard plaque in carotid bulbs. F/u 03/2014 recommended.  Marland Kitchen Special screening for malignant neoplasm of prostate   . Warfarin anticoagulation     AVR  . Ejection fraction     a. EF 55% with inf HK, mild MR by echo 12/2012.  . Asthma   . Arthritis   . GERD (gastroesophageal reflux disease)     hxof       Most Recent Cardiac Studies: Nuc Stress Test 12/2012 Overall Impression: Low risk stress nuclear study. No ischemia. Mildly depressed LV systolic dysfunction..  LV Ejection Fraction: 46%. LV Wall Motion: Mildly depressed LV systolic function.Mild apical hypokinesia.  2D Echo 12/2012 - Left ventricle: LVEF  is approximately 55% with inferior hypokinesis The cavity size was normal. Wall thickness was normal. - Aortic valve: AV prosthesis is difficult to see. Peak and mean gradients through the valve are 22 and 11 mm Hg respectively. Mild aortic insufficiency. - Mitral valve: Mild regurgitation. - Left atrium: The atrium was mildly to moderately dilated. - Pulmonary arteries: PA peak pressure: 31mm  Hg (S).  Carotid Duplex 03/2012: 0-39% bilateral ICA stenosis, stable mild hard plaque in carotid bulbs. F/u 03/2014 recommended   Surgical History:  Past Surgical History  Procedure Laterality Date  . Coronary artery bypass graft    . Aortic valve replacement    . Pacemaker insertion    . Shoulder surgery    . Cardiac catheterization  06/2003  . Circumcision    . Colonoscopy    . Hernia repair    . Coronary stents       prior to bypass   . Inguinal hernia repair Bilateral 02/07/2013    Procedure: LAPAROSCOPIC BILATERAL INGUINAL HERNIA REPAIR;  Surgeon: Wilmon Arms. Corliss Skains, MD;  Location: [facility];  Service: General;  Laterality: Bilateral;  . Insertion of mesh Bilateral 02/07/2013    Procedure: INSERTION OF MESH;  Surgeon: Wilmon Arms. Corliss Skains, MD;  Location: [facility];  Service: General;  Laterality: Bilateral;     Home Meds: Prior to Admission medications   Medication Sig Start Date End Date Taking? Authorizing Provider  Ascorbic Acid (VITAMIN C) 500 MG tablet Take 1,000 mg by mouth daily.    Yes Historical Provider, MD  aspirin 81 MG tablet Take 81 mg by mouth daily.     Yes Historical Provider, MD  calcium carbonate (OS-CAL) 600 MG TABS Take 600 mg by mouth 2 (two) times daily with a meal.   Yes Historical Provider, MD  enoxaparin (LOVENOX) 100 MG/ML injection Inject 1 mL (100 mg total) into the skin every 12 (twelve) hours. 02/03/13  Yes Luis Abed, MD  furosemide (LASIX) 40 MG tablet Take 40 mg by mouth 2 (two) times daily.   Yes Historical Provider, MD  Glucosamine-Chondroitin 250-200 MG CAPS Take 2 capsules by mouth daily.    Yes Historical Provider, MD  loratadine (CLARITIN) 10 MG tablet Take 10 mg by mouth daily.   Yes Historical Provider, MD  Meclizine HCl (BONINE) 25 MG CHEW Chew 1 tablet by mouth daily.     Yes Historical Provider, MD  metoprolol succinate (TOPROL-XL) 100 MG 24 hr tablet Take 100 mg by mouth every morning. Take with or immediately following a meal.   Yes Historical  Provider, MD  Misc Natural Products (TART CHERRY ADVANCED) CAPS Take 1 capsule by mouth every evening.   Yes Historical Provider, MD  Multiple Vitamin (MULTIVITAMIN) tablet Take 1 tablet by mouth daily.     Yes Historical Provider, MD  nitroGLYCERIN (NITROSTAT) 0.4 MG SL tablet Place 1 tablet (0.4 mg total) under the tongue every 5 (five) minutes as needed. For chest pain 09/19/12  Yes Gordy Savers, MD  Omega-3 Fatty Acids (FISH OIL) 1000 MG CAPS Take 1,000 mg by mouth every morning.    Yes Historical Provider, MD  potassium chloride (KLOR-CON M10) 10 MEQ tablet Take 1 tablet (10 mEq total) by mouth 2 (two) times daily. 09/19/12  Yes Gordy Savers, MD  pravastatin (PRAVACHOL) 40 MG tablet Take 40 mg by mouth every evening.    Yes Historical Provider, MD  ramipril (ALTACE) 10 MG capsule Take 10 mg by mouth every morning.   Yes Historical  Provider, MD  tadalafil (CIALIS) 10 MG tablet Take 1 tablet (10 mg total) by mouth daily as needed. 04/10/12  Yes Gordy Savers, MD  traMADol (ULTRAM) 50 MG tablet Take 50 mg by mouth every 6 (six) hours as needed for pain.  07/26/12  Yes Historical Provider, MD  vitamin B-12 (CYANOCOBALAMIN) 1000 MCG tablet Take 1,000 mcg by mouth daily.   Yes Historical Provider, MD  warfarin (COUMADIN) 10 MG tablet Take 5-10 mg by mouth every evening. Takes 5 mg on Monday and 10 mg all the other days   Yes Historical Provider, MD    Inpatient Medications:  . pantoprazole (PROTONIX) IV  40 mg Intravenous QHS   .  ceFAZolin (ANCEF) IV 2 g (02/09/13 1326)  . dextrose 5 % and 0.45% NaCl      Allergies:  Allergies  Allergen Reactions  . Codeine Phosphate     REACTION: difficulty breathing  . Metoprolol Tartrate     REACTION: loss of vision    History   Social History  . Marital Status: Married    Spouse Name: N/A    Number of Children: N/A  . Years of Education: N/A   Occupational History  . Not on file.   Social History Main Topics  . Smoking  status: Former Smoker    Quit date: 07/12/1973  . Smokeless tobacco: Never Used  . Alcohol Use: No  . Drug Use: No  . Sexually Active: No   Other Topics Concern  . Not on file   Social History Narrative  . No narrative on file     Family History  Problem Relation Age of Onset  . Heart attack Father   . Cancer Father     renal cancer  . Diabetes Brother   . Hypertension Mother   . Diabetes Mother      Review of Systems: General: negative for fever. Feels chilly.  Cardiovascular: see above Dermatological: negative for rash Respiratory: negative for cough or wheezing Urologic: see above Abdominal: negative for diarrhea, bright red blood per rectum, melena, or hematemesis Neurologic: negative for visual changes, syncope, or dizziness All other systems reviewed and are otherwise negative except as noted above.  Labs:  Lab Results  Component Value Date   WBC 21.3* 02/09/2013   HGB 9.9* 02/09/2013   HCT 29.8* 02/09/2013   MCV 89.5 02/09/2013   PLT 213 02/09/2013     Recent Labs Lab 02/09/13 1058  NA 137  K 4.8  CL 102  CO2 23  BUN 40*  CREATININE 2.17*  CALCIUM 8.2*  PROT 5.7*  BILITOT 0.3  ALKPHOS 41  ALT 35  AST 28  GLUCOSE 197*   Radiology/Studies:  Ct Abdomen Pelvis Wo Contrast 02/09/2013   *RADIOLOGY REPORT*  Clinical Data: Status post laparoscopic inguinal hernia repair 2 days ago.  Now with low abdominal pain and urinary retention.  CT ABDOMEN AND PELVIS WITHOUT CONTRAST  Technique:  Multidetector CT imaging of the abdomen and pelvis was performed following the standard protocol without intravenous contrast.  Comparison: None.  Findings: Several calcified granulomas are seen within the liver parenchyma.  Calcified granuloma also noted in the spleen.  There is a trace amount of perihepatic and perisplenic ascites.  The stomach, duodenum, pancreas, the and adrenal glands are unremarkable.  The gallbladder is distended.  There is some layering sludge or stones in the  lumen of the gallbladder.  Right kidney is unremarkable.  2.0 cm water density lesion in the left kidney  is compatible with a cyst.  No abdominal aortic aneurysm.  No evidence for lymphadenopathy in the abdomen.  The patient has free gas in the abdomen and lower thorax.  Gas is seen in the intraperitoneal space as well as extraperitoneal compartments of the anterior abdominal wall.  Imaging through the pelvis reveals a large 16.9 x 9.6 x 12.4 cm heterogeneous complex fluid collection in the anterior pelvis, consistent with hematoma.  This hematoma is deep to the rectus musculature and given the mass effect on the bladder and surgical history, it is most likely in the preperitoneal space.  Some blood products are seen tracking into the inguinal regions bilaterally, right greater than left.  There is edema/hemorrhage tracking in the pelvic floor posteriorly to the presacral space in the the patient has some subcutaneous edema in the lower anterior abdominal wall, compatible with recent surgery.  Foley catheter decompresses the urinary bladder.  Terminal ileum is normal. The appendix is not visualized, but there is no edema or inflammation in the region of the cecum.  Bone windows reveal no worrisome lytic or sclerotic osseous lesions.  IMPRESSION: Large extraperitoneal hematoma deep to the rectus musculature in the lower anterior abdominal wall.  Intraperitoneal and extraperitoneal gas in the abdomen and pelvis tracks up into the lower chest consistent with dissection of gas from the history of laparoscopic surgery 2 days ago.   Original Report Authenticated By: Kennith Center, M.D.   Dg Chest 2 View  01/31/2013   *RADIOLOGY REPORT*  Clinical Data: Preoperative evaluation for hernia repair, history asthma, hypertension, smoking, post CABG, AVR, pacemaker  CHEST - 2 VIEW  Comparison: 11/10/2009  Findings: Right subclavian transvenous pacemaker leads project at right atrium and right ventricle. Upper normal heart size  post CABG and AVR. Tortuous aorta. Pulmonary vascularity normal. Lungs clear. No pleural effusion or pneumothorax. Question prior left rotator cuff surgery. Prior resection versus resorption of distal right clavicle. Minimal thoracolumbar scoliosis.  IMPRESSION: Post pacemaker, CABG and AVR. No acute abnormalities.   Original Report Authenticated By: Ulyses Southward, M.D.   Dg Chest Port 1 View  02/09/2013   *RADIOLOGY REPORT*  Clinical Data: Postop hernia surgery  PORTABLE CHEST - 1 VIEW  Comparison: 01/31/2013  Findings: Dual lead right subclavian pacemaker device and leads are stable.  Normal heart size.  Lungs under aerated and clear.  No pneumothorax.  No pleural fluid.  IMPRESSION: No active cardiopulmonary disease.   Original Report Authenticated By: Jolaine Click, M.D.   EKG: Vpaced 82bpm  Physical Exam: Blood pressure 92/58, pulse 92, temperature 97.6 F (36.4 C), temperature source Oral, resp. rate 20, height 5' 7.75" (1.721 m), weight 219 lb (99.338 kg), SpO2 93.00%. General: Well developed, well nourished, in no acute distress. Head: Normocephalic, atraumatic, sclera non-icteric, no xanthomas, nares are without discharge.  Neck: Negative for carotid bruits. JVD not elevated. Lungs: Clear bilaterally to auscultation without wheezes, rales, or rhonchi. Breathing is unlabored. Heart: RRR with S1 S2. Mech click. No murmurs, rubs, or gallops appreciated. Abdomen: Soft, tender with guarding. Hypoactive BS. Msk:  Strength and tone appear normal for age. Extremities: No clubbing or cyanosis. No LE edema.  Distal pedal pulses are 2+ and equal bilaterally. Neuro: Alert and oriented X 3. No facial asymmetry. No focal deficit. Moves all extremities spontaneously. Psych:  Responds to questions appropriately with a normal affect.   Assessment and Plan:   1. Large extraperitoneal abdominal hematoma POD #2 laparoscopic bilateral inguinal hernia repair 2. Anemia, likely acute blood loss 3.  Acute renal  failure with oliguria 4. Hypotension 5. AS s/p Bentall aortic root replacement with a St. Jude mechanical valve and Hemashield conduit 02/2004, on chronic Coumadin 6. CAD s/p 2V CABG 2005, low-risk nuc 12/2012 7. Prior h/o HTN 8. AV block 2005 s/p St. Jude pacemaker  Will likely need to hold antihypertensives in setting of hypotension, but consider continuing possibly lower dose beta blocker as soon as able in setting of known CAD. [name] may not require resumption of ASA given that [name] is on Coumadin. Will discuss recommendations for continued anticoagulation for valve with MD.  Signed, Kriste Basque Dunn PA-C 02/09/2013, 1:39 PM As above, patient seen and examined. Briefly [name] is a 67 year old male with past medical history of aortic stenosis status post Bentall aortic root replacement/St. Jude's mechanical valve, coronary artery disease status post coronary bypassing graft, prior pacemaker and now with extraperitoneal hematoma who I am  asked to evaluate for his aortic valve/anticoagulation. Patient underwent laparoscopic bilateral inguinal hernia repair on July 30. This morning [name] developed diaphoresis and inability to urinate. [name] was mildly dizzy. [name] did not have chest pain, dyspnea or syncope. [name] came to the emergency room and was hypotensive. CT scan shows large extraperitoneal hematoma. Hemoglobin decreased from 15.5 to 9.9. BUN and creatinine increased to 40 and 2.17. Electrocardiogram shows sinus rhythm with ventricular pacing. Exam shows diffuse abdominal tenderness. [name] is hypotensive. I agree with holding all anticoagulation. There is some risk with a mechanical aortic valve but there is no other option at this point. [name] has been given vitamin K and fresh frozen plasma. His INR today was 1.12. We will follow this with repeat values tomorrow. Follow serial hemoglobin and transfuse as needed. If [name] continues to bleed then [name] may require arteriogram to localize site with possible embolization versus surgical  intervention. General surgery is managing this issue. We will not resume anticoagulation until it is clear that [name] has stopped bleeding. His renal insufficiency is most likely related to hypotension/ATN. Hold all antihypertensives. Watch volume status as [name] is on Lasix chronically at home. Note LV function is preserved. Olga Millers

## 2013-02-09 NOTE — ED Notes (Signed)
Pt from home via EMS c/o urinary retention, hematuria, nausea. Pt had hernia repair on Wednesday at this facility. Pt denies CP, SOB

## 2013-02-09 NOTE — ED Notes (Signed)
Unable to obtain labs. Chrissie Noa, tech at bedside trying to obtain ordered labs

## 2013-02-09 NOTE — ED Provider Notes (Addendum)
CSN: [id number]     Arrival date & time 02/09/13  4098 History     First MD Initiated Contact with Patient 02/09/13 [phone]     Chief Complaint  Patient presents with  . Urinary Retention  . Abdominal Pain  . Post-op Problem  . Nausea   (Consider location/radiation/quality/duration/timing/severity/associated sxs/prior Treatment) Patient is a 67 y.o. male presenting with abdominal pain. The history is provided by the patient.  Abdominal Pain This is a new problem. The current episode started 2 days ago. The problem occurs constantly. The problem has been gradually worsening. Associated symptoms include abdominal pain. Pertinent negatives include no chest pain, no headaches and no shortness of breath. Exacerbated by: palpation. Nothing relieves the symptoms. He has tried nothing for the symptoms. The treatment provided no relief.    Past Medical History  Diagnosis Date  . Aortic stenosis 08/25/2010    a. [place] aortic root replacement with a 27  . AORTIC VALVE REPLACEMENT, HX OF 05/23/2009    2005   Bental with aortic root conduit and mechanical AVR  . AV BLOCK, COMPLETE 11/01/2008    a. s/p St Jude dual chamber pacemaker 02/2004.  Marland Kitchen CAD (coronary artery disease) 01/16/2007    a. s/p CABGx2 (LIMA-dLAD, SVG-Cx). b. Low risk nuc 12/2012 without ischemia, EF 46% mild apical hypokinesia (EF 55% inf HK by echo).  . DIVERTICULOSIS, COLON 10/17/2007  . DIZZINESS 06/19/2009  . GOUT 01/16/2007  . HEMORRHOIDS, INTERNAL 11/01/2008  . HYPERLIPIDEMIA 01/16/2007  . HYPERTENSION 01/16/2007  . LEG EDEMA 11/27/2008  . PACEMAKER, PERMANENT 11/01/2008    Dr Ladona Ridgel 02/2004  . Impaired glucose tolerance   . Carotid artery disease     a. 0-39% bilateral ICA stenosis, stable mild hard plaque in carotid bulbs. F/u 03/2014 recommended.  Marland Kitchen Special screening for malignant neoplasm of prostate   . Warfarin anticoagulation     AVR  . Ejection fraction     a. EF 55% with inf HK, mild MR by echo 12/2012.  Marland Kitchen Hx of CABG      2005  . Asthma   . Arthritis   . GERD (gastroesophageal reflux disease)     hxof    Past Surgical History  Procedure Laterality Date  . Coronary artery bypass graft    . Aortic valve replacement    . Pacemaker insertion    . Shoulder surgery    . Cardiac catheterization  06/2003  . Circumcision    . Colonoscopy    . Hernia repair    . Coronary stents       prior to bypass   . Inguinal hernia repair Bilateral 02/07/2013    Procedure: LAPAROSCOPIC BILATERAL INGUINAL HERNIA REPAIR;  Surgeon: Wilmon Arms. Corliss Skains, MD;  Location: [facility];  Service: General;  Laterality: Bilateral;  . Insertion of mesh Bilateral 02/07/2013    Procedure: INSERTION OF MESH;  Surgeon: Wilmon Arms. Corliss Skains, MD;  Location: [facility];  Service: General;  Laterality: Bilateral;   Family History  Problem Relation Age of Onset  . Heart attack Father   . Cancer Father     renal cancer  . Diabetes Brother   . Hypertension Mother   . Diabetes Mother    History  Substance Use Topics  . Smoking status: Former Smoker    Quit date: 07/12/1973  . Smokeless tobacco: Never Used  . Alcohol Use: No    Review of Systems  Constitutional: Negative for fever.  HENT: Negative for rhinorrhea, drooling and neck pain.  Eyes: Negative for pain.  Respiratory: Negative for cough and shortness of breath.   Cardiovascular: Negative for chest pain and leg swelling.  Gastrointestinal: Positive for nausea and abdominal pain. Negative for vomiting and diarrhea.  Genitourinary: Negative for dysuria and hematuria.  Musculoskeletal: Negative for gait problem.  Skin: Negative for color change.  Neurological: Negative for numbness and headaches.  Hematological: Negative for adenopathy.  Psychiatric/Behavioral: Negative for behavioral problems.  All other systems reviewed and are negative.    Allergies  Codeine phosphate and Metoprolol tartrate  Home Medications   Current Outpatient Rx  Name  Route  Sig  Dispense  Refill  .  Ascorbic Acid (VITAMIN C) 500 MG tablet   Oral   Take 1,000 mg by mouth daily.          Marland Kitchen aspirin 81 MG tablet   Oral   Take 81 mg by mouth daily.           . calcium carbonate (OS-CAL) 600 MG TABS   Oral   Take 600 mg by mouth 2 (two) times daily with a meal.         . enoxaparin (LOVENOX) 100 MG/ML injection   Subcutaneous   Inject 1 mL (100 mg total) into the skin every 12 (twelve) hours.   10 Syringe   1   . furosemide (LASIX) 40 MG tablet   Oral   Take 40 mg by mouth 2 (two) times daily.         . Glucosamine-Chondroitin 250-200 MG CAPS   Oral   Take 2 capsules by mouth daily.          Marland Kitchen loratadine (CLARITIN) 10 MG tablet   Oral   Take 10 mg by mouth daily.         . Meclizine HCl (BONINE) 25 MG CHEW   Oral   Chew 1 tablet by mouth daily.           . metoprolol succinate (TOPROL-XL) 100 MG 24 hr tablet   Oral   Take 100 mg by mouth every morning. Take with or immediately following a meal.         . Misc Natural Products (TART CHERRY ADVANCED) CAPS   Oral   Take 1 capsule by mouth every evening.         . Multiple Vitamin (MULTIVITAMIN) tablet   Oral   Take 1 tablet by mouth daily.           . nitroGLYCERIN (NITROSTAT) 0.4 MG SL tablet   Sublingual   Place 1 tablet (0.4 mg total) under the tongue every 5 (five) minutes as needed. For chest pain   30 tablet   1   . Omega-3 Fatty Acids (FISH OIL) 1000 MG CAPS   Oral   Take 1,000 mg by mouth every morning.          . potassium chloride (KLOR-CON M10) 10 MEQ tablet   Oral   Take 1 tablet (10 mEq total) by mouth 2 (two) times daily.   180 tablet   3   . pravastatin (PRAVACHOL) 40 MG tablet   Oral   Take 40 mg by mouth every evening.          . ramipril (ALTACE) 10 MG capsule   Oral   Take 10 mg by mouth every morning.         . tadalafil (CIALIS) 10 MG tablet   Oral   Take 1 tablet (10 mg total) by  mouth daily as needed.   10 tablet   6   . traMADol (ULTRAM) 50 MG  tablet   Oral   Take 50 mg by mouth every 6 (six) hours as needed for pain.          . vitamin B-12 (CYANOCOBALAMIN) 1000 MCG tablet   Oral   Take 1,000 mcg by mouth daily.         Marland Kitchen warfarin (COUMADIN) 10 MG tablet   Oral   Take 5-10 mg by mouth every evening. Takes 5 mg on Monday and 10 mg all the other days          BP 121/61  Pulse 89  Temp(Src) 97.6 F (36.4 C) (Oral)  Resp 15  Ht 5' 7.75" (1.721 m)  Wt 219 lb (99.338 kg)  BMI 33.54 kg/m2  SpO2 99% Physical Exam  Nursing note and vitals reviewed. Constitutional: He is oriented to person, place, and time. He appears well-developed and well-nourished.  HENT:  Head: Normocephalic and atraumatic.  Right Ear: External ear normal.  Left Ear: External ear normal.  Nose: Nose normal.  Mouth/Throat: Oropharynx is clear and moist. No oropharyngeal exudate.  Eyes: Conjunctivae and EOM are normal. Pupils are equal, round, and reactive to light.  Neck: Normal range of motion. Neck supple.  Cardiovascular: Normal rate, regular rhythm, normal heart sounds and intact distal pulses.  Exam reveals no gallop and no friction rub.   No murmur heard. Pulmonary/Chest: Effort normal and breath sounds normal. No respiratory distress. He has no wheezes.  Abdominal: Soft. Bowel sounds are normal. He exhibits no distension. There is tenderness (Moderate ttp of epigastric region and suprapubic/lower abdomen. Three surgical scars in suprapubic/lower abdomen area appear clean, dry, intact. Dependant bruising of penis and scrotum. ). There is no rebound and no guarding.  Musculoskeletal: Normal range of motion. He exhibits no edema and no tenderness.  No swelling or ttp of calves bilaterally.  Bilateral feet cool to touch.   Neurological: He is alert and oriented to person, place, and time.  Skin: Skin is warm and dry.  Psychiatric: He has a normal mood and affect. His behavior is normal.    ED Course   Procedures (including critical care  time)  Labs Reviewed  URINALYSIS, ROUTINE W REFLEX MICROSCOPIC - Abnormal; Notable for the following:    Color, Urine AMBER (*)    APPearance CLOUDY (*)    Bilirubin Urine SMALL (*)    Protein, ur 30 (*)    All other components within normal limits  COMPREHENSIVE METABOLIC PANEL - Abnormal; Notable for the following:    Glucose, Bld 197 (*)    BUN 40 (*)    Creatinine, Ser 2.17 (*)    Calcium 8.2 (*)    Total Protein 5.7 (*)    Albumin 3.2 (*)    GFR calc non Af Amer 30 (*)    GFR calc Af Amer 34 (*)    All other components within normal limits  CBC WITH DIFFERENTIAL - Abnormal; Notable for the following:    WBC 21.3 (*)    RBC 3.33 (*)    Hemoglobin 9.9 (*)    HCT 29.8 (*)    Neutrophils Relative % 87 (*)    Neutro Abs 18.5 (*)    Lymphocytes Relative 7 (*)    Monocytes Absolute 1.3 (*)    All other components within normal limits  LACTIC ACID, PLASMA - Abnormal; Notable for the following:    Lactic Acid,  Venous 4.2 (*)    All other components within normal limits  URINE MICROSCOPIC-ADD ON - Abnormal; Notable for the following:    Squamous Epithelial / LPF FEW (*)    Casts HYALINE CASTS (*)    All other components within normal limits  URINE CULTURE  LIPASE, BLOOD  PROTIME-INR  HEMOGLOBIN AND HEMATOCRIT, BLOOD  TYPE AND SCREEN  ABO/RH  PREPARE RBC (CROSSMATCH)  PREPARE FRESH FROZEN PLASMA   Ct Abdomen Pelvis Wo Contrast  02/09/2013   *RADIOLOGY REPORT*  Clinical Data: Status post laparoscopic inguinal hernia repair 2 days ago.  Now with low abdominal pain and urinary retention.  CT ABDOMEN AND PELVIS WITHOUT CONTRAST  Technique:  Multidetector CT imaging of the abdomen and pelvis was performed following the standard protocol without intravenous contrast.  Comparison: None.  Findings: Several calcified granulomas are seen within the liver parenchyma.  Calcified granuloma also noted in the spleen.  There is a trace amount of perihepatic and perisplenic ascites.  The  stomach, duodenum, pancreas, the and adrenal glands are unremarkable.  The gallbladder is distended.  There is some layering sludge or stones in the lumen of the gallbladder.  Right kidney is unremarkable.  2.0 cm water density lesion in the left kidney is compatible with a cyst.  No abdominal aortic aneurysm.  No evidence for lymphadenopathy in the abdomen.  The patient has free gas in the abdomen and lower thorax.  Gas is seen in the intraperitoneal space as well as extraperitoneal compartments of the anterior abdominal wall.  Imaging through the pelvis reveals a large 16.9 x 9.6 x 12.4 cm heterogeneous complex fluid collection in the anterior pelvis, consistent with hematoma.  This hematoma is deep to the rectus musculature and given the mass effect on the bladder and surgical history, it is most likely in the preperitoneal space.  Some blood products are seen tracking into the inguinal regions bilaterally, right greater than left.  There is edema/hemorrhage tracking in the pelvic floor posteriorly to the presacral space in the the patient has some subcutaneous edema in the lower anterior abdominal wall, compatible with recent surgery.  Foley catheter decompresses the urinary bladder.  Terminal ileum is normal. The appendix is not visualized, but there is no edema or inflammation in the region of the cecum.  Bone windows reveal no worrisome lytic or sclerotic osseous lesions.  IMPRESSION: Large extraperitoneal hematoma deep to the rectus musculature in the lower anterior abdominal wall.  Intraperitoneal and extraperitoneal gas in the abdomen and pelvis tracks up into the lower chest consistent with dissection of gas from the history of laparoscopic surgery 2 days ago.   Original Report Authenticated By: Kennith Center, M.D.   Dg Chest Port 1 View  02/09/2013   *RADIOLOGY REPORT*  Clinical Data: Postop hernia surgery  PORTABLE CHEST - 1 VIEW  Comparison: 01/31/2013  Findings: Dual lead right subclavian pacemaker  device and leads are stable.  Normal heart size.  Lungs under aerated and clear.  No pneumothorax.  No pleural fluid.  IMPRESSION: No active cardiopulmonary disease.   Original Report Authenticated By: Jolaine Click, M.D.   1. Abdominal pain   2. Hypotension   3. Acute renal failure   4. Hyperglycemia   5. Leukocytosis   6. Acute blood loss anemia   7. Lactic acidosis   8. Intra-abdominal hematoma, initial encounter      Date: 02/09/2013  Rate: 82  Rhythm: ventricular paced rhythm  QRS Axis: indeterminate  Intervals: indeterminate due to paced  rhythm  ST/T Wave abnormalities: indeterminate  Conduction Disutrbances:indeterminate due to paced rhythm  Narrative Interpretation: Ventricular paced rhythm  Old EKG Reviewed: unchanged MDM  12:35 PM 67 y.o. male w/ hx of aortic valve replacement on coumadin and s/p bilat ing hernia surgery on 02/07/13 who pw chills, lower abd pain, urinary retention, hypotension which began early this morning. Pt noted to have 90/60 BP at home, 81/47 here. Pt not tachycardic, denies cp or sob, not febrile. Doubt PE/ACS/sepsis. Pt restarted coumadin immediately after surgery was also taking lovenox at the same time. Concern for post operative bleeding in abdomen, acute urinary retention possibly from foley placement during procedure vs ARF. Abdomen ttp in epig and suprapubic region. Will get labs, CT abd, and monitor response to IVF 1L bolus x 2.   Surgery consulted. Plan for admit to Gen Surg.   CRITICAL CARE Performed by: Purvis Sheffield, S Total critical care time: 30 Critical care time was exclusive of separately billable procedures and treating other patients. Critical care was necessary to treat or prevent imminent or life-threatening deterioration. Critical care was time spent personally by me on the following activities: development of treatment plan with patient and/or surrogate as well as nursing, discussions with consultants, evaluation of patient's  response to treatment, examination of patient, obtaining history from patient or surrogate, ordering and performing treatments and interventions, ordering and review of laboratory studies, ordering and review of radiographic studies, pulse oximetry and re-evaluation of patient's condition.   Junius Argyle, MD 02/09/13 1235  Junius Argyle, MD 02/09/13 1328  Junius Argyle, MD 02/12/13 [phone]

## 2013-02-09 NOTE — Progress Notes (Signed)
Handoff report received from Darl Pikes, RN, ED. Patient transferring to room 1226.

## 2013-02-09 NOTE — H&P (Signed)
General surgery attending note:  I personally interviewed and examined this patient. I have reviewed his lab work, CT scan, operative summary, and I have discussed his case with Dr. Manus Rudd. I agree with the assessment and treatment plan outlined by Barnetta Chapel, PA.  This patient is 48 hours postop from laparoscopic bilateral inguinal hernia repairs with 3-D Max mesh. The surgery was uneventful. The patient had received a Lovenox bridge because of his mechanical heart valve, and he started on high dose Lovenox and Coumadin the night of surgery at home. At home he developed increasing lower abdominal pressure, oliguria and hypotension and diaphoresis. On arrival in the emergency department he was hypotensive. Foley catheter has been inserted. Urine is clear. Abdomen is soft in the upper abdomen  and tender with fullness in the infraumbilical area. He has a normal amount of ecchymoses but no external hematoma. BP is up to 115 systolic and heart rate is down to 95 after 3 L of saline. INR is normal. The rest of his lab work has been summarized elsewhere.  CT scan shows a contained extraperitoneal fluid collection which is probably a little over 1000 cc of whole blood. There is a little bit of residual carbon dioxide in the peritoneal space. The ureters are not dilated and there is no evidence of hydronephrosis or obstruction of the GU tract.   Assessment/plan:  Acute postoperative hemorrhage, preperitoneal space, anterior lower abdominal wall secondary to pharmacologically-induced coagulopathy. Recent TEPP repair bilateral inguinal hernias (02/07/2013)  resuscitate with crystalloid, packed RBC, and FFP. Clear liquids by mouth Admit to ICU for observation Cardiology and internal medicine have been consulted to participate in his care Most likely, he will not require surgical intervention for his hematoma since it most likely will tamponade  and slowly resolve over time. He will obviously need to  be followed carefully to make sure that this is the natural history of his bleeding.  acute renal failure, presume secondary to intravascular volume depletion. S/p CABG S/p AVR-mechanical Permanent pacemaker HTN  The biggest issue will be how  balance the need to control hemorrhage with the need to provide VTE  prophylaxis for his mechanical heart valve.    Angelia Mould. Derrell Lolling, M.D., [facility] General and Minimally invasive Surgery Breast and Colorectal Surgery Office:   [phone] Pager:   [phone]

## 2013-02-10 DIAGNOSIS — D649 Anemia, unspecified: Secondary | ICD-10-CM

## 2013-02-10 DIAGNOSIS — R7309 Other abnormal glucose: Secondary | ICD-10-CM

## 2013-02-10 DIAGNOSIS — R109 Unspecified abdominal pain: Secondary | ICD-10-CM

## 2013-02-10 LAB — PREPARE FRESH FROZEN PLASMA: Unit division: 0

## 2013-02-10 LAB — GLUCOSE, CAPILLARY: Glucose-Capillary: 162 mg/dL — ABNORMAL HIGH (ref 70–99)

## 2013-02-10 LAB — BASIC METABOLIC PANEL
BUN: 47 mg/dL — ABNORMAL HIGH (ref 6–23)
CO2: 24 mEq/L (ref 19–32)
Calcium: 8.2 mg/dL — ABNORMAL LOW (ref 8.4–10.5)
Creatinine, Ser: 2.01 mg/dL — ABNORMAL HIGH (ref 0.50–1.35)
Glucose, Bld: 205 mg/dL — ABNORMAL HIGH (ref 70–99)

## 2013-02-10 LAB — URINE CULTURE

## 2013-02-10 LAB — CBC
MCH: 30.2 pg (ref 26.0–34.0)
MCV: 87.7 fL (ref 78.0–100.0)
Platelets: 142 10*3/uL — ABNORMAL LOW (ref 150–400)
RDW: 14.7 % (ref 11.5–15.5)

## 2013-02-10 LAB — HEMOGLOBIN AND HEMATOCRIT, BLOOD: HCT: 26.1 % — ABNORMAL LOW (ref 39.0–52.0)

## 2013-02-10 MED ORDER — INSULIN ASPART 100 UNIT/ML ~~LOC~~ SOLN
0.0000 [IU] | SUBCUTANEOUS | Status: DC
  Administered 2013-02-10: 1 [IU] via SUBCUTANEOUS
  Administered 2013-02-10 (×4): 2 [IU] via SUBCUTANEOUS
  Administered 2013-02-11: 1 [IU] via SUBCUTANEOUS
  Administered 2013-02-11 – 2013-02-12 (×5): 2 [IU] via SUBCUTANEOUS
  Administered 2013-02-12 (×3): 1 [IU] via SUBCUTANEOUS
  Administered 2013-02-12: 5 [IU] via SUBCUTANEOUS
  Administered 2013-02-13: 1 [IU] via SUBCUTANEOUS
  Administered 2013-02-13: 2 [IU] via SUBCUTANEOUS
  Administered 2013-02-13 (×2): 1 [IU] via SUBCUTANEOUS
  Administered 2013-02-13: 2 [IU] via SUBCUTANEOUS
  Administered 2013-02-13: 1 [IU] via SUBCUTANEOUS
  Administered 2013-02-14 (×2): 2 [IU] via SUBCUTANEOUS
  Administered 2013-02-14 (×2): 1 [IU] via SUBCUTANEOUS
  Administered 2013-02-14: 2 [IU] via SUBCUTANEOUS
  Administered 2013-02-15: 1 [IU] via SUBCUTANEOUS
  Administered 2013-02-15 (×2): 2 [IU] via SUBCUTANEOUS
  Administered 2013-02-15 – 2013-02-17 (×11): 1 [IU] via SUBCUTANEOUS
  Administered 2013-02-17: 2 [IU] via SUBCUTANEOUS
  Administered 2013-02-17: 1 [IU] via SUBCUTANEOUS
  Administered 2013-02-17: 2 [IU] via SUBCUTANEOUS

## 2013-02-10 MED ORDER — SODIUM CHLORIDE 0.9 % IV BOLUS (SEPSIS)
500.0000 mL | Freq: Once | INTRAVENOUS | Status: AC
  Administered 2013-02-10: 500 mL via INTRAVENOUS

## 2013-02-10 NOTE — Progress Notes (Signed)
Subjective: Complains of a lot of lower abdominal pain  Objective: Vital signs in last 24 hours: Temp:  [97.4 F (36.3 C)-99 F (37.2 C)] 97.9 F (36.6 C) (08/02 0600) Pulse Rate:  [71-118] 89 (08/02 0600) Resp:  [12-23] 17 (08/02 0600) BP: (66-142)/(44-101) 120/73 mmHg (08/02 0600) SpO2:  [91 %-100 %] 98 % (08/02 0600) Weight:  [219 lb (99.338 kg)-238 lb 1.6 oz (108 kg)] 238 lb 1.6 oz (108 kg) (08/01 1402) Last BM Date: 02/09/13  Intake/Output from previous day: 08/01 0701 - 08/02 0700 In: 4382.5 [P.O.:50; I.V.:2490; Blood:1692.5; IV Piggyback:150] Out: 575 [Urine:575] Intake/Output this shift:    GI: tender diffusely with bruising over lower abdomen. few bs  Lab Results:   Recent Labs  02/09/13 1058  02/09/13 1831 02/09/13 2324  WBC 21.3*  --   --   --   HGB 9.9*  < > 9.9* 7.0*  HCT 29.8*  < > 29.5* 20.8*  PLT 213  --   --   --   < > = values in this interval not displayed. BMET  Recent Labs  02/09/13 1058  NA 137  K 4.8  CL 102  CO2 23  GLUCOSE 197*  BUN 40*  CREATININE 2.17*  CALCIUM 8.2*   PT/INR  Recent Labs  02/09/13 1040 02/09/13 1903  LABPROT 14.2 13.9  INR 1.12 1.09   ABG No results found for this basename: PHART, PCO2, PO2, HCO3,  in the last 72 hours  Studies/Results: Ct Abdomen Pelvis Wo Contrast  02/09/2013   *RADIOLOGY REPORT*  Clinical Data: Status post laparoscopic inguinal hernia repair 2 days ago.  Now with low abdominal pain and urinary retention.  CT ABDOMEN AND PELVIS WITHOUT CONTRAST  Technique:  Multidetector CT imaging of the abdomen and pelvis was performed following the standard protocol without intravenous contrast.  Comparison: None.  Findings: Several calcified granulomas are seen within the liver parenchyma.  Calcified granuloma also noted in the spleen.  There is a trace amount of perihepatic and perisplenic ascites.  The stomach, duodenum, pancreas, the and adrenal glands are unremarkable.  The gallbladder is  distended.  There is some layering sludge or stones in the lumen of the gallbladder.  Right kidney is unremarkable.  2.0 cm water density lesion in the left kidney is compatible with a cyst.  No abdominal aortic aneurysm.  No evidence for lymphadenopathy in the abdomen.  The patient has free gas in the abdomen and lower thorax.  Gas is seen in the intraperitoneal space as well as extraperitoneal compartments of the anterior abdominal wall.  Imaging through the pelvis reveals a large 16.9 x 9.6 x 12.4 cm heterogeneous complex fluid collection in the anterior pelvis, consistent with hematoma.  This hematoma is deep to the rectus musculature and given the mass effect on the bladder and surgical history, it is most likely in the preperitoneal space.  Some blood products are seen tracking into the inguinal regions bilaterally, right greater than left.  There is edema/hemorrhage tracking in the pelvic floor posteriorly to the presacral space in the the patient has some subcutaneous edema in the lower anterior abdominal wall, compatible with recent surgery.  Foley catheter decompresses the urinary bladder.  Terminal ileum is normal. The appendix is not visualized, but there is no edema or inflammation in the region of the cecum.  Bone windows reveal no worrisome lytic or sclerotic osseous lesions.  IMPRESSION: Large extraperitoneal hematoma deep to the rectus musculature in the lower anterior abdominal wall.  Intraperitoneal and extraperitoneal gas in the abdomen and pelvis tracks up into the lower chest consistent with dissection of gas from the history of laparoscopic surgery 2 days ago.   Original Report Authenticated By: Kennith Center, M.D.   Dg Chest Port 1 View  02/09/2013   *RADIOLOGY REPORT*  Clinical Data: Postop hernia surgery  PORTABLE CHEST - 1 VIEW  Comparison: 01/31/2013  Findings: Dual lead right subclavian pacemaker device and leads are stable.  Normal heart size.  Lungs under aerated and clear.  No  pneumothorax.  No pleural fluid.  IMPRESSION: No active cardiopulmonary disease.   Original Report Authenticated By: Jolaine Click, M.D.    Anti-infectives: Anti-infectives   Start     Dose/Rate Route Frequency Ordered Stop   02/09/13 1400  ceFAZolin (ANCEF) IVPB 2 g/50 mL premix     2 g 100 mL/hr over 30 Minutes Intravenous 3 times per day 02/09/13 1142        Assessment/Plan: s/p * No surgery found * Recheck hg and cr this am Continue to monitor uop Continue to monitor in icu Hold blood thinners  LOS: 1 day    TOTH III,PAUL S 02/10/2013

## 2013-02-10 NOTE — Progress Notes (Signed)
eLink Physician-Brief Progress Note Patient Name: [name] DOB: 10-May-1946 MRN: [id number]  Date of Service  02/10/2013   HPI/Events of Note   Lab result - anemia  eICU Interventions  Transfuse 2U PRBC   Intervention Category Intermediate Interventions: Bleeding - evaluation and treatment with blood products  Lavalle Skoda K. 02/10/2013, 12:21 AM

## 2013-02-10 NOTE — Progress Notes (Signed)
   Subjective:  Denies CP or dyspnea; complains of abdominal pain   Objective:  Filed Vitals:   02/10/13 0415 02/10/13 0430 02/10/13 0530 02/10/13 0600  BP: 134/85 101/66 142/66 120/73  Pulse: 102 91 88 89  Temp: 97.9 F (36.6 C) 97.8 F (36.6 C) 97.5 F (36.4 C) 97.9 F (36.6 C)  TempSrc: Oral Tympanic Oral Oral  Resp: 20 16 18 17   Height:      Weight:      SpO2:    98%    Intake/Output from previous day:  Intake/Output Summary (Last 24 hours) at 02/10/13 0732 Last data filed at 02/10/13 0700  Gross per 24 hour  Intake 4382.5 ml  Output    575 ml  Net 3807.5 ml    Physical Exam: Physical exam: Well-developed well-nourished in no acute distress.  Skin is warm and dry.  HEENT is normal.  Neck is supple.  Chest is clear to auscultation with normal expansion.  Cardiovascular exam is regular rate and rhythm. Crisp mechanical valve sound Abdominal exam diffuse tenderness to palpation. Extremities show no edema. neuro grossly intact    Lab Results: Basic Metabolic Panel:  Recent Labs  16/10/96 1058  NA 137  K 4.8  CL 102  CO2 23  GLUCOSE 197*  BUN 40*  CREATININE 2.17*  CALCIUM 8.2*   CBC:  Recent Labs  02/09/13 1058  02/09/13 1831 02/09/13 2324  WBC 21.3*  --   --   --   NEUTROABS 18.5*  --   --   --   HGB 9.9*  < > 9.9* 7.0*  HCT 29.8*  < > 29.5* 20.8*  MCV 89.5  --   --   --   PLT 213  --   --   --   < > = values in this interval not displayed.   Assessment/Plan:  1 H/O AVR - hold all anticoagulation until it is clear bleeding has stopped 2 extraperitoneal hematoma/acute blood loss anemia/hypovolemic shock - transfuse as needed; management per general surgery/CCM. Follow Hgb. 3 Acute kidney injury - most likely ATN from hypotension; await repeat BMET 4 HTN - BP meds on hold; follow volume status closey (patient on lasix at home); resume BP meds later as BP and renal function improves. 5 CAD/s/p CABG 6 H/O pacemaker - telemetry reveals  sinus tach with ventricular pacing.   Olga Millers 02/10/2013, 7:32 AM

## 2013-02-10 NOTE — Progress Notes (Signed)
PULMONARY  / CRITICAL CARE MEDICINE  Name: [name] MRN: [id number] DOB: 1945/07/17    ADMISSION DATE:  02/09/2013 CONSULTATION DATE:  02/09/13  REFERRING MD :  Derrell Lolling  PRIMARY SERVICE: [facility]/ gen surgery   CHIEF COMPLAINT:  abd pain/ weak  BRIEF PATIENT DESCRIPTION: 67 yowm s/p avr 2005 on chronic coumadin s/p Bil IH 7/30 readmitted am 8/1 with abd pain and weakness and found to have extensive extraperinal abd hematoma and blood loss anemia so PCCM asked to eval pm 8/1 for possible shock with low uop and soft bps  SIGNIFICANT EVENTS / STUDIES:  CT abd 8/1 > Large extraperitoneal hematoma deep to the rectus musculature in  the lower anterior abdominal wall.  Intraperitoneal and extraperitoneal gas in the abdomen and pelvis  tracks up into the lower chest consistent with dissection of gas  from the history of laparoscopic surgery 2 days ago.   LINES / TUBES:    CULTURES:    ANTIBIOTICS: Ancef 8/1 >>   SUBJECTIVE:  Complains of continued abdominal pain and more abdominal swelling this morning Transfused 2 U PRBC  VITAL SIGNS: Temp:  [97 F (36.1 C)-99 F (37.2 C)] 97 F (36.1 C) (08/02 0800) Pulse Rate:  [71-118] 89 (08/02 0600) Resp:  [12-23] 21 (08/02 0802) BP: (66-142)/(44-101) 114/63 mmHg (08/02 0802) SpO2:  [91 %-100 %] 99 % (08/02 0802) Weight:  [108 kg (238 lb 1.6 oz)] 108 kg (238 lb 1.6 oz) (08/01 1402) FIO2  2lpm NP  HEMODYNAMICS:   VENTILATOR SETTINGS:   INTAKE / OUTPUT: Intake/Output     08/01 0701 - 08/02 0700 08/02 0701 - 08/03 0700   P.O. 50    I.V. (mL/kg) 2490 (23.1) 125 (1.2)   Blood 1692.5    IV Piggyback 150    Total Intake(mL/kg) 4382.5 (40.6) 125 (1.2)   Urine (mL/kg/hr) 575 60 (0.3)   Total Output 575 60   Net +3807.5 +65          PHYSICAL EXAMINATION:  Gen: resting comfortably HEENT: NCAT, EOMi, OP clear,  PULM: CTA B CV: RRR, mechanical heart valve, no JVD AB: BS+, distended, bruising lower abdomen, tender lower  abdomen Ext: warm, pitting ankle edema, no clubbing, no cyanosis Derm: no rash or skin breakdown Neuro: A&Ox4, MAEW   LABS:  CBC Recent Labs     02/09/13  1058  02/09/13  1400  02/09/13  1831  02/09/13  2324  WBC  21.3*   --    --    --   HGB  9.9*  9.1*  9.9*  7.0*  HCT  29.8*  28.0*  29.5*  20.8*  PLT  213   --    --    --    Coag's Recent Labs     02/07/13  1103  02/09/13  1040  02/09/13  1903  APTT  28   --    --   INR  0.99  1.12  1.09   BMET Recent Labs     02/09/13  1058  NA  137  K  4.8  CL  102  CO2  23  BUN  40*  CREATININE  2.17*  GLUCOSE  197*   Electrolytes Recent Labs     02/09/13  1058  CALCIUM  8.2*   Sepsis Markers No results found for this basename: LACTICACIDVEN, PROCALCITON, O2SATVEN,  in the last 72 hours ABG No results found for this basename: PHART, PCO2ART, PO2ART,  in the last 72 hours  Liver Enzymes Recent Labs     02/09/13  1058  AST  28  ALT  35  ALKPHOS  41  BILITOT  0.3  ALBUMIN  3.2*   Cardiac Enzymes No results found for this basename: TROPONINI, PROBNP,  in the last 72 hours Glucose No results found for this basename: GLUCAP,  in the last 72 hours  Imaging Ct Abdomen Pelvis Wo Contrast  02/09/2013   *RADIOLOGY REPORT*  Clinical Data: Status post laparoscopic inguinal hernia repair 2 days ago.  Now with low abdominal pain and urinary retention.  CT ABDOMEN AND PELVIS WITHOUT CONTRAST  Technique:  Multidetector CT imaging of the abdomen and pelvis was performed following the standard protocol without intravenous contrast.  Comparison: None.  Findings: Several calcified granulomas are seen within the liver parenchyma.  Calcified granuloma also noted in the spleen.  There is a trace amount of perihepatic and perisplenic ascites.  The stomach, duodenum, pancreas, the and adrenal glands are unremarkable.  The gallbladder is distended.  There is some layering sludge or stones in the lumen of the gallbladder.  Right kidney is  unremarkable.  2.0 cm water density lesion in the left kidney is compatible with a cyst.  No abdominal aortic aneurysm.  No evidence for lymphadenopathy in the abdomen.  The patient has free gas in the abdomen and lower thorax.  Gas is seen in the intraperitoneal space as well as extraperitoneal compartments of the anterior abdominal wall.  Imaging through the pelvis reveals a large 16.9 x 9.6 x 12.4 cm heterogeneous complex fluid collection in the anterior pelvis, consistent with hematoma.  This hematoma is deep to the rectus musculature and given the mass effect on the bladder and surgical history, it is most likely in the preperitoneal space.  Some blood products are seen tracking into the inguinal regions bilaterally, right greater than left.  There is edema/hemorrhage tracking in the pelvic floor posteriorly to the presacral space in the the patient has some subcutaneous edema in the lower anterior abdominal wall, compatible with recent surgery.  Foley catheter decompresses the urinary bladder.  Terminal ileum is normal. The appendix is not visualized, but there is no edema or inflammation in the region of the cecum.  Bone windows reveal no worrisome lytic or sclerotic osseous lesions.  IMPRESSION: Large extraperitoneal hematoma deep to the rectus musculature in the lower anterior abdominal wall.  Intraperitoneal and extraperitoneal gas in the abdomen and pelvis tracks up into the lower chest consistent with dissection of gas from the history of laparoscopic surgery 2 days ago.   Original Report Authenticated By: Kennith Center, M.D.   Dg Chest Port 1 View  02/09/2013   *RADIOLOGY REPORT*  Clinical Data: Postop hernia surgery  PORTABLE CHEST - 1 VIEW  Comparison: 01/31/2013  Findings: Dual lead right subclavian pacemaker device and leads are stable.  Normal heart size.  Lungs under aerated and clear.  No pneumothorax.  No pleural fluid.  IMPRESSION: No active cardiopulmonary disease.   Original Report  Authenticated By: Jolaine Click, M.D.     CXR:  n/a  ASSESSMENT / PLAN:  PULMONARY A: No Acute issues this morning -monitor O2 saturation/volume status  CARDIOVASCULAR A: Hypovolemia shock secondary to blood loss s/p AVR/ paced rhythm P : -continue scheduled fluids -continue PRBC as noted below  RENAL Lab Results  Component Value Date   CREATININE 2.17* 02/09/2013   CREATININE 0.91 01/31/2013   CREATININE 0.9 10/02/2012    A:  Acute renal failure, oliguric, on  ACEi on admit P:  -hold ACE -continue IVF -one time bolus 500cc for continue oliguria    GASTROINTESTINAL A:  ? Developing ileus post op       Abd wall hematoma P:   -Judicious analgesics  HEMATOLOGIC  Recent Labs Lab 02/09/13 1400 02/09/13 1831 02/09/13 2324  HGB 9.1* 9.9* 7.0*    Lab Results  Component Value Date   INR 1.09 02/09/2013   INR 1.12 02/09/2013   INR 0.99 02/07/2013   PROTIME 19.0 12/26/2008   PROTIME 25.1 12/16/2008    A:  Acute blood loss anemia, coagulopathy corrected P:  -Transfusion for Hgb < 7 -repeat H/H later today  INFECTIOUS A:  No issues P:   Ancef rx per CCS    Today's summary: 66 yo male on chronic anticoagulation for St Jude aortic valve who is s/p lap hernia repair 7/30 readmitted on 8/1 due to rectal sheath hematoma and AKI.  Transfused again overnight for dropping Hgb; still oliguric.  Add  500cc fluid bolus now, f/u repeat h/h and BMET this morning.  Yolonda Kida PCCM Pager: [phone]

## 2013-02-11 LAB — CBC
HCT: 23 % — ABNORMAL LOW (ref 39.0–52.0)
Hemoglobin: 7.8 g/dL — ABNORMAL LOW (ref 13.0–17.0)
MCH: 29.9 pg (ref 26.0–34.0)
MCHC: 33.9 g/dL (ref 30.0–36.0)
MCV: 88.1 fL (ref 78.0–100.0)
Platelets: 144 10*3/uL — ABNORMAL LOW (ref 150–400)
RBC: 2.61 MIL/uL — ABNORMAL LOW (ref 4.22–5.81)
RDW: 14.9 % (ref 11.5–15.5)
WBC: 12.6 10*3/uL — ABNORMAL HIGH (ref 4.0–10.5)

## 2013-02-11 LAB — TYPE AND SCREEN
Unit division: 0
Unit division: 0

## 2013-02-11 LAB — BASIC METABOLIC PANEL
BUN: 43 mg/dL — ABNORMAL HIGH (ref 6–23)
CO2: 21 mEq/L (ref 19–32)
Calcium: 8.2 mg/dL — ABNORMAL LOW (ref 8.4–10.5)
Chloride: 97 mEq/L (ref 96–112)
Creatinine, Ser: 1.46 mg/dL — ABNORMAL HIGH (ref 0.50–1.35)
GFR calc Af Amer: 55 mL/min — ABNORMAL LOW (ref >90)
GFR calc non Af Amer: 48 mL/min — ABNORMAL LOW (ref >90)
Glucose, Bld: 180 mg/dL — ABNORMAL HIGH (ref 70–99)
Potassium: 4.7 mEq/L (ref 3.5–5.1)
Sodium: 127 mEq/L — ABNORMAL LOW (ref 135–145)

## 2013-02-11 LAB — GLUCOSE, CAPILLARY
Glucose-Capillary: 158 mg/dL — ABNORMAL HIGH (ref 70–99)
Glucose-Capillary: 155 mg/dL — ABNORMAL HIGH (ref 70–99)
Glucose-Capillary: 135 mg/dL — ABNORMAL HIGH (ref 70–99)

## 2013-02-11 LAB — PROTIME-INR
INR: 1.07 (ref 0.00–1.49)
Prothrombin Time: 13.7 seconds (ref 11.6–15.2)

## 2013-02-11 LAB — HEMOGLOBIN AND HEMATOCRIT, BLOOD
HCT: 23 % — ABNORMAL LOW (ref 39.0–52.0)
Hemoglobin: 8 g/dL — ABNORMAL LOW (ref 13.0–17.0)

## 2013-02-11 MED ORDER — SODIUM CHLORIDE 0.9 % IV SOLN
INTRAVENOUS | Status: DC
  Administered 2013-02-11: 75 mL/h via INTRAVENOUS
  Administered 2013-02-12: 02:00:00 via INTRAVENOUS

## 2013-02-11 NOTE — Progress Notes (Signed)
Subjective: Still tender but less so today. Feels a little better. No nausea  Objective: Vital signs in last 24 hours: Temp:  [97 F (36.1 C)-99.2 F (37.3 C)] 98.8 F (37.1 C) (08/03 0400) Pulse Rate:  [106] 106 (08/02 2000) Resp:  [14-23] 17 (08/03 0400) BP: (96-147)/(49-76) 106/64 mmHg (08/03 0400) SpO2:  [94 %-99 %] 97 % (08/03 0400) Last BM Date: 02/09/13  Intake/Output from previous day: 08/02 0701 - 08/03 0700 In: 3515 [P.O.:50; I.V.:2815; IV Piggyback:650] Out: 680 [Urine:680] Intake/Output this shift:    GI: soft, less tender. good bs. bruising of lower abdominal wall  Lab Results:   Recent Labs  02/10/13 0920 02/10/13 1850 02/11/13 0520  WBC 15.5*  --  12.6*  HGB 9.8* 8.8* 7.8*  HCT 28.4* 26.1* 23.0*  PLT 142*  --  144*   BMET  Recent Labs  02/10/13 0920 02/11/13 0346  NA 131* 127*  K 4.8 4.7  CL 98 97  CO2 24 21  GLUCOSE 205* 180*  BUN 47* 43*  CREATININE 2.01* 1.46*  CALCIUM 8.2* 8.2*   PT/INR  Recent Labs  02/10/13 0920 02/11/13 0520  LABPROT 13.2 13.7  INR 1.02 1.07   ABG No results found for this basename: PHART, PCO2, PO2, HCO3,  in the last 72 hours  Studies/Results: Ct Abdomen Pelvis Wo Contrast  02/09/2013   *RADIOLOGY REPORT*  Clinical Data: Status post laparoscopic inguinal hernia repair 2 days ago.  Now with low abdominal pain and urinary retention.  CT ABDOMEN AND PELVIS WITHOUT CONTRAST  Technique:  Multidetector CT imaging of the abdomen and pelvis was performed following the standard protocol without intravenous contrast.  Comparison: None.  Findings: Several calcified granulomas are seen within the liver parenchyma.  Calcified granuloma also noted in the spleen.  There is a trace amount of perihepatic and perisplenic ascites.  The stomach, duodenum, pancreas, the and adrenal glands are unremarkable.  The gallbladder is distended.  There is some layering sludge or stones in the lumen of the gallbladder.  Right kidney is  unremarkable.  2.0 cm water density lesion in the left kidney is compatible with a cyst.  No abdominal aortic aneurysm.  No evidence for lymphadenopathy in the abdomen.  The patient has free gas in the abdomen and lower thorax.  Gas is seen in the intraperitoneal space as well as extraperitoneal compartments of the anterior abdominal wall.  Imaging through the pelvis reveals a large 16.9 x 9.6 x 12.4 cm heterogeneous complex fluid collection in the anterior pelvis, consistent with hematoma.  This hematoma is deep to the rectus musculature and given the mass effect on the bladder and surgical history, it is most likely in the preperitoneal space.  Some blood products are seen tracking into the inguinal regions bilaterally, right greater than left.  There is edema/hemorrhage tracking in the pelvic floor posteriorly to the presacral space in the the patient has some subcutaneous edema in the lower anterior abdominal wall, compatible with recent surgery.  Foley catheter decompresses the urinary bladder.  Terminal ileum is normal. The appendix is not visualized, but there is no edema or inflammation in the region of the cecum.  Bone windows reveal no worrisome lytic or sclerotic osseous lesions.  IMPRESSION: Large extraperitoneal hematoma deep to the rectus musculature in the lower anterior abdominal wall.  Intraperitoneal and extraperitoneal gas in the abdomen and pelvis tracks up into the lower chest consistent with dissection of gas from the history of laparoscopic surgery 2 days ago.  Original Report Authenticated By: Kennith Center, M.D.   Dg Chest Port 1 View  02/09/2013   *RADIOLOGY REPORT*  Clinical Data: Postop hernia surgery  PORTABLE CHEST - 1 VIEW  Comparison: 01/31/2013  Findings: Dual lead right subclavian pacemaker device and leads are stable.  Normal heart size.  Lungs under aerated and clear.  No pneumothorax.  No pleural fluid.  IMPRESSION: No active cardiopulmonary disease.   Original Report  Authenticated By: Jolaine Click, M.D.    Anti-infectives: Anti-infectives   Start     Dose/Rate Route Frequency Ordered Stop   02/09/13 1400  ceFAZolin (ANCEF) IVPB 2 g/50 mL premix     2 g 100 mL/hr over 30 Minutes Intravenous 3 times per day 02/09/13 1142        Assessment/Plan: Whitney/p * No surgery found * Advance diet. Start full liquids today Continue to monitor in icu. Hg still drifting down but hasn't required blood since 8/1  LOS: 2 days    [name],[name] 02/11/2013

## 2013-02-11 NOTE — Progress Notes (Signed)
RT placed 2 LPM oxygen back on patient due to patient desating. RN aware.

## 2013-02-11 NOTE — Progress Notes (Addendum)
PULMONARY  / CRITICAL CARE MEDICINE  Name: [name] MRN: [id number] DOB: May 20, 1946    ADMISSION DATE:  02/09/2013 CONSULTATION DATE:  02/09/13  REFERRING MD :  Derrell Lolling  PRIMARY SERVICE: [facility]/ gen surgery   CHIEF COMPLAINT:  abd pain/ weak  BRIEF PATIENT DESCRIPTION: 67 yowm s/p avr 2005 on chronic coumadin s/p Bil IH 7/30 readmitted am 8/1 with abd pain and weakness and found to have extensive extraperinal abd hematoma and blood loss anemia so PCCM asked to eval pm 8/1 for possible shock with low uop and soft bps  SIGNIFICANT EVENTS / STUDIES:  CT abd 8/1 > Large extraperitoneal hematoma deep to the rectus musculature in  the lower anterior abdominal wall.  Intraperitoneal and extraperitoneal gas in the abdomen and pelvis  tracks up into the lower chest consistent with dissection of gas  from the history of laparoscopic surgery 2 days ago.   LINES / TUBES:    CULTURES:    ANTIBIOTICS: Ancef 8/1 >>   SUBJECTIVE:  Feels better today  VITAL SIGNS: Temp:  [97.2 F (36.2 C)-99.2 F (37.3 C)] 98.1 F (36.7 C) (08/03 0800) Pulse Rate:  [106] 106 (08/02 2000) Resp:  [14-23] 17 (08/03 0800) BP: (96-147)/(49-76) 129/64 mmHg (08/03 0800) SpO2:  [95 %-99 %] 99 % (08/03 0800) FIO2  2lpm [place]  HEMODYNAMICS:   VENTILATOR SETTINGS:   INTAKE / OUTPUT: Intake/Output     08/02 0701 - 08/03 0700 08/03 0701 - 08/04 0700   P.O. 50    I.V. (mL/kg) 2940 (27.2) 100 (0.9)   Blood     IV Piggyback 650    Total Intake(mL/kg) 3640 (33.7) 100 (0.9)   Urine (mL/kg/hr) 895 (0.3) 400 (1.3)   Total Output 895 400   Net +2745 -300          PHYSICAL EXAMINATION:  Gen: resting comfortably HEENT: NCAT, EOMi, OP clear,  PULM: CTA B CV: RRR, mechanical heart valve, no JVD AB: BS+, distended, bruising lower abdomen,less tender lower abdomen Ext: warm, pitting ankle edema, no clubbing, no cyanosis Derm: no rash or skin breakdown Neuro: A&Ox4, MAEW   LABS:  CBC Recent Labs      02/09/13  1058   02/10/13  0920  02/10/13  1850  02/11/13  0520  WBC  21.3*   --   15.5*   --   12.6*  HGB  9.9*   < >  9.8*  8.8*  7.8*  HCT  29.8*   < >  28.4*  26.1*  23.0*  PLT  213   --   142*   --   144*   < > = values in this interval not displayed.   Coag's Recent Labs     02/09/13  1903  02/10/13  0920  02/11/13  0520  APTT   --   27   --   INR  1.09  1.02  1.07   BMET Recent Labs     02/09/13  1058  02/10/13  0920  02/11/13  0346  NA  137  131*  127*  K  4.8  4.8  4.7  CL  102  98  97  CO2  23  24  21   BUN  40*  47*  43*  CREATININE  2.17*  2.01*  1.46*  GLUCOSE  197*  205*  180*   Electrolytes Recent Labs     02/09/13  1058  02/10/13  0920  02/11/13  0346  CALCIUM  8.2*  8.2*  8.2*   Sepsis Markers No results found for this basename: LACTICACIDVEN, PROCALCITON, O2SATVEN,  in the last 72 hours ABG No results found for this basename: PHART, PCO2ART, PO2ART,  in the last 72 hours Liver Enzymes Recent Labs     02/09/13  1058  AST  28  ALT  35  ALKPHOS  41  BILITOT  0.3  ALBUMIN  3.2*   Cardiac Enzymes No results found for this basename: TROPONINI, PROBNP,  in the last 72 hours Glucose Recent Labs     02/10/13  0940  02/10/13  1114  02/10/13  1554  02/10/13  2016  02/10/13  2306  02/11/13  0434  GLUCAP  167*  170*  162*  163*  141*  183*    Imaging Ct Abdomen Pelvis Wo Contrast  02/09/2013   *RADIOLOGY REPORT*  Clinical Data: Status post laparoscopic inguinal hernia repair 2 days ago.  Now with low abdominal pain and urinary retention.  CT ABDOMEN AND PELVIS WITHOUT CONTRAST  Technique:  Multidetector CT imaging of the abdomen and pelvis was performed following the standard protocol without intravenous contrast.  Comparison: None.  Findings: Several calcified granulomas are seen within the liver parenchyma.  Calcified granuloma also noted in the spleen.  There is a trace amount of perihepatic and perisplenic ascites.  The stomach, duodenum,  pancreas, the and adrenal glands are unremarkable.  The gallbladder is distended.  There is some layering sludge or stones in the lumen of the gallbladder.  Right kidney is unremarkable.  2.0 cm water density lesion in the left kidney is compatible with a cyst.  No abdominal aortic aneurysm.  No evidence for lymphadenopathy in the abdomen.  The patient has free gas in the abdomen and lower thorax.  Gas is seen in the intraperitoneal space as well as extraperitoneal compartments of the anterior abdominal wall.  Imaging through the pelvis reveals a large 16.9 x 9.6 x 12.4 cm heterogeneous complex fluid collection in the anterior pelvis, consistent with hematoma.  This hematoma is deep to the rectus musculature and given the mass effect on the bladder and surgical history, it is most likely in the preperitoneal space.  Some blood products are seen tracking into the inguinal regions bilaterally, right greater than left.  There is edema/hemorrhage tracking in the pelvic floor posteriorly to the presacral space in the the patient has some subcutaneous edema in the lower anterior abdominal wall, compatible with recent surgery.  Foley catheter decompresses the urinary bladder.  Terminal ileum is normal. The appendix is not visualized, but there is no edema or inflammation in the region of the cecum.  Bone windows reveal no worrisome lytic or sclerotic osseous lesions.  IMPRESSION: Large extraperitoneal hematoma deep to the rectus musculature in the lower anterior abdominal wall.  Intraperitoneal and extraperitoneal gas in the abdomen and pelvis tracks up into the lower chest consistent with dissection of gas from the history of laparoscopic surgery 2 days ago.   Original Report Authenticated By: Kennith Center, M.D.   Dg Chest Port 1 View  02/09/2013   *RADIOLOGY REPORT*  Clinical Data: Postop hernia surgery  PORTABLE CHEST - 1 VIEW  Comparison: 01/31/2013  Findings: Dual lead right subclavian pacemaker device and leads are  stable.  Normal heart size.  Lungs under aerated and clear.  No pneumothorax.  No pleural fluid.  IMPRESSION: No active cardiopulmonary disease.   Original Report Authenticated By: Jolaine Click, M.D.     CXR:  n/a  ASSESSMENT / PLAN:  PULMONARY A: No Acute issues this morning -monitor O2 saturation/volume status  CARDIOVASCULAR A: Hypovolemia shock secondary to blood loss > improved s/p AVR/ paced rhythm P : -continue scheduled fluids -continue PRBC as noted below -restart anticoagulation per surgery/cardiology  RENAL Lab Results  Component Value Date   CREATININE 1.46* 02/11/2013   CREATININE 2.01* 02/10/2013   CREATININE 2.17* 02/09/2013    A:  Acute renal failure, resolving Hyponatremia, likely SIADH due to pain P:  -Restart ACE later this week -change IVF to NS given hyponatremia -consider free water restrict and work up if no improvement in Na on BMET  8/5    GASTROINTESTINAL A:  ? Developing ileus post op       Abd wall hematoma P:   -Judicious analgesics -per surgery  HEMATOLOGIC  Recent Labs Lab 02/10/13 0920 02/10/13 1850 02/11/13 0520  HGB 9.8* 8.8* 7.8*    Lab Results  Component Value Date   INR 1.07 02/11/2013   INR 1.02 02/10/2013   INR 1.09 02/09/2013   PROTIME 19.0 12/26/2008   PROTIME 25.1 12/16/2008    A:  Acute blood loss anemia, coagulopathy corrected P:  -Transfusion for Hgb < 7 -repeat H/H 1900  INFECTIOUS A:  No issues P:   Ancef rx per CCS    Today's summary: 67 yo male on chronic anticoagulation for St Jude aortic valve who is s/p lap hernia repair 7/30 readmitted on 8/1 due to rectal sheath hematoma and AKI.  AKI improving; H/H a little lower but overall doing well Repeat h/h later today PCCM will likely sign off 8/5 if continued improvement   Yolonda Kida PCCM Pager: [phone]

## 2013-02-11 NOTE — Progress Notes (Signed)
   Subjective:  Denies CP or dyspnea; complains of abdominal pain   Objective:  Filed Vitals:   02/11/13 0000 02/11/13 0140 02/11/13 0200 02/11/13 0400  BP: 100/65 134/66 128/63 106/64  Pulse:      Temp:    98.8 F (37.1 C)  TempSrc:    Oral  Resp: 20 20 23 17   Height:      Weight:      SpO2: 97% 98% 98% 97%    Intake/Output from previous day:  Intake/Output Summary (Last 24 hours) at 02/11/13 0758 Last data filed at 02/11/13 0600  Gross per 24 hour  Intake   3515 ml  Output    895 ml  Net   2620 ml    Physical Exam: Physical exam: Well-developed well-nourished in no acute distress.  Skin is warm and dry.  HEENT is normal.  Neck is supple.  Chest is clear to auscultation with normal expansion.  Cardiovascular exam is regular rate and rhythm. Crisp mechanical valve sound Abdominal exam diffuse tenderness to palpation (improved). Extremities show trace-1+ edema in thighs. neuro grossly intact    Lab Results: Basic Metabolic Panel:  Recent Labs  40/98/11 0920 02/11/13 0346  NA 131* 127*  K 4.8 4.7  CL 98 97  CO2 24 21  GLUCOSE 205* 180*  BUN 47* 43*  CREATININE 2.01* 1.46*  CALCIUM 8.2* 8.2*   CBC:  Recent Labs  02/09/13 1058  02/10/13 0920 02/10/13 1850 02/11/13 0520  WBC 21.3*  --  15.5*  --  12.6*  NEUTROABS 18.5*  --   --   --   --   HGB 9.9*  < > 9.8* 8.8* 7.8*  HCT 29.8*  < > 28.4* 26.1* 23.0*  MCV 89.5  --  87.7  --  88.1  PLT 213  --  142*  --  144*  < > = values in this interval not displayed.   Assessment/Plan:  1 H/O AVR - continue to hold all anticoagulation until it is clear bleeding has stopped 2 extraperitoneal hematoma/acute blood loss anemia/hypovolemic shock - transfuse as needed; management per general surgery/CCM. Follow Hgb (trending down this AM). 3 Acute kidney injury - most likely ATN from hypotension; some improvement this AM 4 HTN - BP meds on hold; follow volume status closey (patient on lasix at home); resume BP  meds later as BP and renal function improves. 5 CAD/s/p CABG 6 H/O pacemaker - telemetry reveals sinus tach with ventricular pacing.   Olga Millers 02/11/2013, 7:58 AM

## 2013-02-12 LAB — PROTIME-INR: Prothrombin Time: 14.1 seconds (ref 11.6–15.2)

## 2013-02-12 LAB — BASIC METABOLIC PANEL
BUN: 23 mg/dL (ref 6–23)
Calcium: 8.2 mg/dL — ABNORMAL LOW (ref 8.4–10.5)
Chloride: 102 mEq/L (ref 96–112)
Creatinine, Ser: 0.84 mg/dL (ref 0.50–1.35)
GFR calc Af Amer: >90 mL/min (ref >90)

## 2013-02-12 LAB — GLUCOSE, CAPILLARY
Glucose-Capillary: 129 mg/dL — ABNORMAL HIGH (ref 70–99)
Glucose-Capillary: 131 mg/dL — ABNORMAL HIGH (ref 70–99)
Glucose-Capillary: 139 mg/dL — ABNORMAL HIGH (ref 70–99)
Glucose-Capillary: 181 mg/dL — ABNORMAL HIGH (ref 70–99)
Glucose-Capillary: 295 mg/dL — ABNORMAL HIGH (ref 70–99)

## 2013-02-12 LAB — HEMOGLOBIN AND HEMATOCRIT, BLOOD
HCT: 22.2 % — ABNORMAL LOW (ref 39.0–52.0)
Hemoglobin: 7.6 g/dL — ABNORMAL LOW (ref 13.0–17.0)

## 2013-02-12 MED ORDER — FUROSEMIDE 10 MG/ML IJ SOLN
40.0000 mg | Freq: Once | INTRAMUSCULAR | Status: AC
  Administered 2013-02-12: 40 mg via INTRAVENOUS
  Filled 2013-02-12: qty 4

## 2013-02-12 MED ORDER — PANTOPRAZOLE SODIUM 40 MG PO TBEC
40.0000 mg | DELAYED_RELEASE_TABLET | Freq: Every day | ORAL | Status: DC
  Administered 2013-02-12 – 2013-02-23 (×12): 40 mg via ORAL
  Filled 2013-02-12 (×12): qty 1

## 2013-02-12 MED ORDER — METOPROLOL SUCCINATE ER 25 MG PO TB24
25.0000 mg | ORAL_TABLET | Freq: Every day | ORAL | Status: DC
  Administered 2013-02-12 – 2013-02-14 (×3): 25 mg via ORAL
  Filled 2013-02-12 (×4): qty 1

## 2013-02-12 NOTE — Progress Notes (Signed)
PULMONARY  / CRITICAL CARE MEDICINE  Name: [name] MRN: [id number] DOB: Nov 23, 1945    ADMISSION DATE:  02/09/2013 CONSULTATION DATE:  02/09/13  REFERRING MD :  Derrell Lolling  PRIMARY SERVICE: CCS/ gen surgery   CHIEF COMPLAINT:  abd pain/ weak  BRIEF PATIENT DESCRIPTION: 67 yowm s/p avr 2005 on chronic coumadin s/p Bil IH 7/30 readmitted am 8/1 with abd pain and weakness and found to have extensive extraperinal abd hematoma and blood loss anemia so PCCM asked to eval pm 8/1 for possible shock with low uop and soft bps  SIGNIFICANT EVENTS / STUDIES:  CT abd 8/1 > Large extraperitoneal hematoma deep to the rectus musculature in  the lower anterior abdominal wall.  Intraperitoneal and extraperitoneal gas in the abdomen and pelvis  tracks up into the lower chest consistent with dissection of gas  from the history of laparoscopic surgery 2 days ago.   LINES / TUBES:    CULTURES:    ANTIBIOTICS: Ancef 8/1 >>   SUBJECTIVE:  Feels better today  VITAL SIGNS: Temp:  [98.4 F (36.9 C)-99.6 F (37.6 C)] 98.4 F (36.9 C) (08/04 0400) Resp:  [17-22] 19 (08/04 0349) BP: (120-146)/(64-73) 133/66 mmHg (08/04 0349) SpO2:  [97 %-100 %] 99 % (08/04 0349) Weight:  [114.3 kg (251 lb 15.8 oz)] 114.3 kg (251 lb 15.8 oz) (08/04 0500) Room air  HEMODYNAMICS:   VENTILATOR SETTINGS:   INTAKE / OUTPUT: Intake/Output     08/03 0701 - 08/04 0700 08/04 0701 - 08/05 0700   P.O. 240    I.V. (mL/kg) 1850 (16.2)    IV Piggyback 150    Total Intake(mL/kg) 2240 (19.6)    Urine (mL/kg/hr) 2400 (0.9)    Total Output 2400     Net -160            PHYSICAL EXAMINATION:  Gen: resting comfortably HEENT: NCAT, EOMi, OP clear,  PULM: CTA B CV: RRR, mechanical heart valve, no JVD + click  AB: BS+, distended, bruising lower abdomen,less tender lower abdomen Ext: warm, pitting ankle edema, no clubbing, no cyanosis Derm: no rash or skin breakdown Neuro: A&Ox4, MAEW   LABS:  CBC Recent Labs     02/09/13  1058   02/10/13  0920  02/10/13  1850  02/11/13  0520  02/11/13  1905  WBC  21.3*   --   15.5*   --   12.6*   --   HGB  9.9*   < >  9.8*  8.8*  7.8*  8.0*  HCT  29.8*   < >  28.4*  26.1*  23.0*  23.0*  PLT  213   --   142*   --   144*   --    < > = values in this interval not displayed.   Coag's Recent Labs     02/10/13  0920  02/11/13  0520  02/12/13  0345  APTT  27   --    --   INR  1.02  1.07  1.11   BMET Recent Labs     02/10/13  0920  02/11/13  0346  02/12/13  0345  NA  131*  127*  133*  K  4.8  4.7  4.3  CL  98  97  102  CO2  24  21  26   BUN  47*  43*  23  CREATININE  2.01*  1.46*  0.84  GLUCOSE  205*  180*  146*   Electrolytes  Recent Labs     02/10/13  0920  02/11/13  0346  02/12/13  0345  CALCIUM  8.2*  8.2*  8.2*   Sepsis Markers No results found for this basename: LACTICACIDVEN, PROCALCITON, O2SATVEN,  in the last 72 hours ABG No results found for this basename: PHART, PCO2ART, PO2ART,  in the last 72 hours Liver Enzymes Recent Labs     02/09/13  1058  AST  28  ALT  35  ALKPHOS  41  BILITOT  0.3  ALBUMIN  3.2*   Cardiac Enzymes No results found for this basename: TROPONINI, PROBNP,  in the last 72 hours Glucose Recent Labs     02/11/13  0737  02/11/13  1220  02/11/13  1522  02/11/13  1950  02/11/13  2350  02/12/13  0341  GLUCAP  158*  155*  135*  139*  128*  134*    Imaging No results found.   CXR:  n/a  ASSESSMENT / PLAN:  PULMONARY A: No Acute issues this morning -monitor O2 saturation/volume status  CARDIOVASCULAR A: Hypovolemia shock secondary to blood loss > improved s/p AVR/ paced rhythm P : -continue scheduled fluids -continue PRBC as noted below -restart anticoagulation per surgery/cardiology  RENAL   A:  Acute renal failure, resolved Hyponatremia, likely SIADH due to pain P:  -Restart ACE later this week -changed IVF to NS given hyponatremia -consider free water restrict and work up if no  improvement in Na on BMET  8/5    GASTROINTESTINAL A:  ? Developing ileus post op       Abd wall hematoma P:   -Judicious analgesics -per surgery  HEMATOLOGIC   A:  Acute blood loss anemia, coagulopathy corrected hgb holding  P:  -Transfusion for Hgb < 7 -repeat H/H 1900 - to resume anticoagulation when ok by surgical team  INFECTIOUS A:  No issues P:   Ancef rx per CCS    Today's summary: 67 yo male male on chronic anticoagulation for St Jude aortic valve who is s/p lap hernia repair 7/30 readmitted on 8/1 due to rectal sheath hematoma and AKI.  AKI improving; H/H a little lower but overall doing well PCCM will s/o   Reviewed above, examined pt, and agree with assessment/plan.  Hemodynamics stable, and clinically improving.  PCCM will sign off.  Please call if additional help needed.  Coralyn Helling, MD [facility] Pulmonary/Critical Care 02/12/2013, 11:49 AM Pager:  [phone] After 3pm call: [phone]

## 2013-02-12 NOTE — Progress Notes (Addendum)
Patient ID: [name], male   DOB: October 01, 1945, 67 y.o.   MRN: [id number]    Subjective: Continues to feel better, +flatus, no bm, tolerating diet, denies n/v, wants to get up and walk more  Objective: Vital signs in last 24 hours: Temp:  [98.4 F (36.9 C)-99.6 F (37.6 C)] 98.4 F (36.9 C) (08/04 0400) Resp:  [17-24] 23 (08/04 0830) BP: (120-155)/(59-73) 155/59 mmHg (08/04 0830) SpO2:  [94 %-100 %] 94 % (08/04 0830) Weight:  [251 lb 15.8 oz (114.3 kg)] 251 lb 15.8 oz (114.3 kg) (08/04 0500) Last BM Date: 02/09/13  Intake/Output from previous day: 08/03 0701 - 08/04 0700 In: 2240 [P.O.:240; I.V.:1850; IV Piggyback:150] Out: 2400 [Urine:2400] Intake/Output this shift:    GI: soft, less tender. good bs. bruising more so in scrotum now, scrotum and penis very swollen, would not be able to remove foley  Lab Results:   Recent Labs  02/10/13 0920  02/11/13 0520 02/11/13 1905  WBC 15.5*  --  12.6*  --   HGB 9.8*  < > 7.8* 8.0*  HCT 28.4*  < > 23.0* 23.0*  PLT 142*  --  144*  --   < > = values in this interval not displayed. BMET  Recent Labs  02/11/13 0346 02/12/13 0345  NA 127* 133*  K 4.7 4.3  CL 97 102  CO2 21 26  GLUCOSE 180* 146*  BUN 43* 23  CREATININE 1.46* 0.84  CALCIUM 8.2* 8.2*   PT/INR  Recent Labs  02/11/13 0520 02/12/13 0345  LABPROT 13.7 14.1  INR 1.07 1.11   ABG No results found for this basename: PHART, PCO2, PO2, HCO3,  in the last 72 hours  Studies/Results: No results found.  Anti-infectives: Anti-infectives   Start     Dose/Rate Route Frequency Ordered Stop   02/09/13 1400  ceFAZolin (ANCEF) IVPB 2 g/50 mL premix     2 g 100 mL/hr over 30 Minutes Intravenous 3 times per day 02/09/13 1142        Assessment/Plan: 1.  Lower abdomen/groin hematoma s/p bilateral IH repair 02/07/2013 - M Tsuei - : S/P 2 untis PRBC, doing well, recheck hgb today, has been stable, will plan transfer to the floor today  --soft diet  --transfer to  floor  --leave foley due to scrotal and penis swelling  2.  Anticoagulation 3.  History of pacemaker. 4.  History of CAD     LOS: 3 days    WHITE, ELIZABETH 02/12/2013  Agree with above. Wife in room.  I answered questions. Will have to go slow removing foley. Seems to be doing well.  Ovidio Kin, MD, [facility] Surgery Pager: [phone] Office phone:  [phone]

## 2013-02-12 NOTE — Progress Notes (Signed)
CARE MANAGEMENT NOTE 02/12/2013  Patient:  [name], [name]   Account Number:  [id number]  Date Initiated:  02/12/2013  Documentation initiated by:  DAVIS,RHONDA  Subjective/Objective Assessment:   pt with infection and retroperitoneal hematoma versus abcess.     Action/Plan:   to med surg floor from sdu on [id number] will be to go home   Anticipated DC Date:  02/15/2013   Anticipated DC Plan:  HOME/SELF CARE  In-house referral  NA      DC Planning Services  NA      [facility] Choice  NA   Choice offered to / List presented to:  NA   DME arranged  NA      DME agency  NA     HH arranged  NA      HH agency  NA   Status of service:  In process, will continue to follow Medicare Important Message given?  NA - LOS <3 / Initial given by admissions (If response is "NO", the following Medicare IM given date fields will be blank) Date Medicare IM given:   Date Additional Medicare IM given:    Discharge Disposition:    Per UR Regulation:  Reviewed for med. necessity/level of care/duration of stay  If discussed at Long Length of Stay Meetings, dates discussed:    Comments:   [id number] Earlene Plater RN, BSN, CCN: [id number] Case management. Chart reviewed for discharge planning and present needs. Discharge needs: none present at time of review. Next chart review due:  [id number]

## 2013-02-12 NOTE — Progress Notes (Signed)
   Subjective:  Denies CP or dyspnea; abdominal pain improving   Objective:  Filed Vitals:   02/11/13 2355 02/12/13 0349 02/12/13 0400 02/12/13 0500  BP: 146/64 133/66    Pulse:      Temp: 99.4 F (37.4 C) 98.4 F (36.9 C) 98.4 F (36.9 C)   TempSrc: Oral  Oral   Resp: 22 19    Height:      Weight:    251 lb 15.8 oz (114.3 kg)  SpO2: 97% 99%      Intake/Output from previous day:  Intake/Output Summary (Last 24 hours) at 02/12/13 0717 Last data filed at 02/12/13 0600  Gross per 24 hour  Intake   2240 ml  Output   2400 ml  Net   -160 ml    Physical Exam: Physical exam: Well-developed well-nourished in no acute distress.  Skin is warm and dry.  HEENT is normal.  Neck is supple.  Chest mildly diminished BS bases Cardiovascular exam is regular rate and rhythm. Crisp mechanical valve sound Abdominal exam diffuse tenderness to palpation (improving). Extremities show trace-1+ edema in thighs and ankles. neuro grossly intact    Lab Results: Basic Metabolic Panel:  Recent Labs  16/10/96 0346 02/12/13 0345  NA 127* 133*  K 4.7 4.3  CL 97 102  CO2 21 26  GLUCOSE 180* 146*  BUN 43* 23  CREATININE 1.46* 0.84  CALCIUM 8.2* 8.2*   CBC:  Recent Labs  02/09/13 1058  02/10/13 0920  02/11/13 0520 02/11/13 1905  WBC 21.3*  --  15.5*  --  12.6*  --   NEUTROABS 18.5*  --   --   --   --   --   HGB 9.9*  < > 9.8*  < > 7.8* 8.0*  HCT 29.8*  < > 28.4*  < > 23.0* 23.0*  MCV 89.5  --  87.7  --  88.1  --   PLT 213  --  142*  --  144*  --   < > = values in this interval not displayed.   Assessment/Plan:  1 H/O AVR - continue to hold all anticoagulation until it is clear bleeding has stopped; will await input from surgery concerning timing of resuming heparin/coumadin. 2 extraperitoneal hematoma/acute blood loss anemia/hypovolemic shock - transfuse as needed; management per general surgery/CCM. Follow Hgb (trending down this AM). 3 Acute kidney injury - most likely  ATN from hypotension; continues to improve 4 HTN - BP improving; patient mildly volume overloaded on exam; DC IVFs; lasix 40 mg IV x 1. Resume low dose toprol. 5 CAD/s/p CABG 6 H/O pacemaker - telemetry reveals sinus tach with ventricular pacing.   Olga Millers 02/12/2013, 7:17 AM

## 2013-02-13 LAB — GLUCOSE, CAPILLARY: Glucose-Capillary: 126 mg/dL — ABNORMAL HIGH (ref 70–99)

## 2013-02-13 LAB — BASIC METABOLIC PANEL
CO2: 28 mEq/L (ref 19–32)
Calcium: 8.4 mg/dL (ref 8.4–10.5)
Creatinine, Ser: 0.81 mg/dL (ref 0.50–1.35)
GFR calc non Af Amer: 89 mL/min — ABNORMAL LOW (ref >90)
Glucose, Bld: 136 mg/dL — ABNORMAL HIGH (ref 70–99)

## 2013-02-13 LAB — CBC
MCH: 29.7 pg (ref 26.0–34.0)
MCV: 89.5 fL (ref 78.0–100.0)
RDW: 14.9 % (ref 11.5–15.5)
WBC: 10.3 10*3/uL (ref 4.0–10.5)

## 2013-02-13 LAB — PROTIME-INR: INR: 1.11 (ref 0.00–1.49)

## 2013-02-13 LAB — PREPARE RBC (CROSSMATCH)

## 2013-02-13 LAB — HEPARIN LEVEL (UNFRACTIONATED): Heparin Unfractionated: <0.1 IU/mL — ABNORMAL LOW (ref 0.30–0.70)

## 2013-02-13 MED ORDER — AZITHROMYCIN 250 MG PO TABS
250.0000 mg | ORAL_TABLET | Freq: Every day | ORAL | Status: DC
  Administered 2013-02-14: 250 mg via ORAL
  Filled 2013-02-13 (×2): qty 1

## 2013-02-13 MED ORDER — FUROSEMIDE 10 MG/ML IJ SOLN
40.0000 mg | Freq: Two times a day (BID) | INTRAMUSCULAR | Status: DC
  Administered 2013-02-13 (×2): 40 mg via INTRAVENOUS
  Filled 2013-02-13 (×5): qty 4

## 2013-02-13 MED ORDER — HEPARIN (PORCINE) IN NACL 100-0.45 UNIT/ML-% IJ SOLN
1250.0000 [IU]/h | INTRAMUSCULAR | Status: DC
  Filled 2013-02-13: qty 250

## 2013-02-13 MED ORDER — POTASSIUM CHLORIDE CRYS ER 20 MEQ PO TBCR
20.0000 meq | EXTENDED_RELEASE_TABLET | Freq: Every day | ORAL | Status: DC
  Administered 2013-02-13 – 2013-02-23 (×11): 20 meq via ORAL
  Filled 2013-02-13 (×11): qty 1

## 2013-02-13 MED ORDER — HEPARIN (PORCINE) IN NACL 100-0.45 UNIT/ML-% IJ SOLN
1250.0000 [IU]/h | INTRAMUSCULAR | Status: DC
Start: 2013-02-13 — End: 2013-02-13
  Administered 2013-02-13: 1250 [IU]/h via INTRAVENOUS
  Filled 2013-02-13: qty 250

## 2013-02-13 MED ORDER — HEPARIN (PORCINE) IN NACL 100-0.45 UNIT/ML-% IJ SOLN
1500.0000 [IU]/h | INTRAMUSCULAR | Status: DC
  Filled 2013-02-13: qty 250

## 2013-02-13 MED ORDER — AZITHROMYCIN 500 MG PO TABS
500.0000 mg | ORAL_TABLET | Freq: Every day | ORAL | Status: AC
  Administered 2013-02-13: 500 mg via ORAL
  Filled 2013-02-13: qty 1

## 2013-02-13 NOTE — Progress Notes (Signed)
   Subjective:  Denies CP or dyspnea; abdominal pain much improved; complains of congestion and productive cough   Objective:  Filed Vitals:   02/12/13 1240 02/12/13 1500 02/12/13 2224 02/13/13 0504  BP: 125/72 126/69 145/84 106/62  Pulse: 103 96 100 81  Temp: 99.1 F (37.3 C) 99.8 F (37.7 C) 100.3 F (37.9 C) 98.5 F (36.9 C)  TempSrc: Oral Oral Oral Oral  Resp:  16 16 16   Height:      Weight:      SpO2: 97% 97% 98% 94%    Intake/Output from previous day:  Intake/Output Summary (Last 24 hours) at 02/13/13 1914 Last data filed at 02/13/13 0504  Gross per 24 hour  Intake    700 ml  Output   3000 ml  Net  -2300 ml    Physical Exam: Physical exam: Well-developed well-nourished in no acute distress.  Skin is warm and dry.  HEENT is normal.  Neck is supple.  Chest mildly diminished BS bases Cardiovascular exam is regular rate and rhythm. Crisp mechanical valve sound Abdominal exam diffuse tenderness to palpation (improving). Extremities show trace-1+ edema in thighs and ankles. neuro grossly intact    Lab Results: Basic Metabolic Panel:  Recent Labs  78/29/56 0345 02/13/13 0345  NA 133* 135  K 4.3 4.2  CL 102 101  CO2 26 28  GLUCOSE 146* 136*  BUN 23 18  CREATININE 0.84 0.81  CALCIUM 8.2* 8.4   CBC:  Recent Labs  02/11/13 0520  02/12/13 1025 02/13/13 0345  WBC 12.6*  --   --  10.3  HGB 7.8*  < > 7.6* 7.1*  HCT 23.0*  < > 22.2* 21.4*  MCV 88.1  --   --  89.5  PLT 144*  --   --  203  < > = values in this interval not displayed.   Assessment/Plan:  1 H/O AVR - all anticoagulation is on hold at present; need input from Dr Ezzard Standing concerning timing of resuming heparin/coumadin (hopefully soon given AVR). 2 extraperitoneal hematoma/acute blood loss anemia/hypovolemic shock - transfuse as needed; management per general surgery/CCM. Follow Hgb (continues to trend down this AM). 3 Acute kidney injury - most likely ATN from hypotension; resoloved. 4  HTN - BP controlled at present; will increase meds to preadmission dose if increases; patient volume overloaded on exam; lasix 40 mg IV BID; follow renal function. 5 CAD/s/p CABG 6 H/O pacemaker 7 Fever-complains of productive cough; probable URI; add azithromycin; check UA with foley in place.   [name] 02/13/2013, 6:07 AM

## 2013-02-13 NOTE — Progress Notes (Addendum)
ANTICOAGULATION CONSULT NOTE - Follow Up Consult  Pharmacy Consult for Heparin Indication: Mechanical Aortic Heart Valve  Allergies  Allergen Reactions  . Codeine Phosphate     REACTION: difficulty breathing  . Metoprolol Tartrate     REACTION: loss of vision    Patient Measurements: Height: 5\' 7"  (170.2 cm) Weight: 251 lb 15.8 oz (114.3 kg) IBW/kg (Calculated) : 66.1 Heparin Dosing Weight: 91.6kg  Vital Signs: Temp: 99.1 F (37.3 C) (08/05 1430) Temp src: Oral (08/05 1430) BP: 124/62 mmHg (08/05 1430) Pulse Rate: 92 (08/05 1430)  Labs:  Recent Labs  02/11/13 0346 02/11/13 0520 02/11/13 1905 02/12/13 0345 02/12/13 1025 02/13/13 0345 02/13/13 1715  HGB  --  7.8* 8.0*  --  7.6* 7.1*  --   HCT  --  23.0* 23.0*  --  22.2* 21.4*  --   PLT  --  144*  --   --   --  203  --   LABPROT  --  13.7  --  14.1  --  14.1  --   INR  --  1.07  --  1.11  --  1.11  --   HEPARINUNFRC  --   --   --   --   --   --  <0.10*  CREATININE 1.46*  --   --  0.84  --  0.81  --     Estimated Creatinine Clearance: 105.4 ml/min (by C-G formula based on Cr of 0.81).   Medical History: Past Medical History  Diagnosis Date  . Aortic stenosis 08/25/2010    a. Bentall aortic root replacement with a St. Jude mechanical valve and Hemashield conduit 02/2004.  . AV BLOCK, COMPLETE     a. s/p St Jude dual chamber pacemaker 02/2004.  Marland Kitchen CAD (coronary artery disease)     a. s/p CABGx2 (LIMA-dLAD, SVG-Cx). b. Low risk nuc 12/2012 without ischemia, EF 46% mild apical hypokinesia (EF 55% inf HK by echo).  . DIVERTICULOSIS, COLON 10/17/2007  . GOUT 01/16/2007  . HEMORRHOIDS, INTERNAL 11/01/2008  . HYPERLIPIDEMIA 01/16/2007  . HYPERTENSION 01/16/2007  . LEG EDEMA 11/27/2008  . Impaired glucose tolerance   . Carotid artery disease     a. 0-39% bilateral ICA stenosis, stable mild hard plaque in carotid bulbs. F/u 03/2014 recommended.  Marland Kitchen Special screening for malignant neoplasm of prostate   . Warfarin anticoagulation      AVR  . Ejection fraction     a. EF 55% with inf HK, mild MR by echo 12/2012.  . Asthma   . Arthritis   . GERD (gastroesophageal reflux disease)     hxof     Medications:  Scheduled:  . [START ON 02/14/2013] azithromycin  250 mg Oral Daily  .  ceFAZolin (ANCEF) IV  2 g Intravenous Q8H  . furosemide  40 mg Intravenous BID  . insulin aspart  0-9 Units Subcutaneous Q4H  . metoprolol succinate  25 mg Oral Daily  . pantoprazole  40 mg Oral Daily  . potassium chloride  20 mEq Oral Daily   Infusions:  . heparin 1,250 Units/hr (02/13/13 1610)    Assessment: 67 yo male admitted 8/1 with lower abdomen/groin hematoma s/p bilateral inguinal hernia repair 7/30. Patient on chronic coumadin for AVR and was on lovenox bridge prior to and following surgery which is currently on hold  Bruising in scrotum reported, Hgb remains low, Plt wnl  INR at baseline off coumadin  Per CCS, transfused 1 unit PRBC and began heparin without bolus  today  First HL < 0.1 with heparin infusing @ 1250 units/hr  Spoke with RN about any interruptions of therapy; upon viewing IV site, RN reports that IV had infiltrated for an unknown period of time  RN to restart IV heparin in new IV line  Goal of Therapy:  Heparin level 0.3-0.7 units/ml Monitor platelets by anticoagulation protocol: Yes   Plan:   Continue IV heparin @ 1250 units/hr (due to IV site being infiltrated)  Recheck heparin level in 6 hrs  Daily heparin level, CBC  F/u coumadin restart possibly 8/7  Terrilee Files, PharmD  02/13/2013,6:12 PM

## 2013-02-13 NOTE — Progress Notes (Signed)
ANTICOAGULATION CONSULT NOTE - Initial Consult  Pharmacy Consult for Heparin Indication: Mechanical Aortic Heart Valve  Allergies  Allergen Reactions  . Codeine Phosphate     REACTION: difficulty breathing  . Metoprolol Tartrate     REACTION: loss of vision    Patient Measurements: Height: 5\' 7"  (170.2 cm) Weight: 251 lb 15.8 oz (114.3 kg) IBW/kg (Calculated) : 66.1 Heparin Dosing Weight: 91.6kg  Vital Signs: Temp: 98.5 F (36.9 C) (08/05 0504) Temp src: Oral (08/05 0504) BP: 106/62 mmHg (08/05 0504) Pulse Rate: 81 (08/05 0504)  Labs:  Recent Labs  02/10/13 0920  02/11/13 0346 02/11/13 0520 02/11/13 1905 02/12/13 0345 02/12/13 1025 02/13/13 0345  HGB 9.8*  < >  --  7.8* 8.0*  --  7.6* 7.1*  HCT 28.4*  < >  --  23.0* 23.0*  --  22.2* 21.4*  PLT 142*  --   --  144*  --   --   --  203  APTT 27  --   --   --   --   --   --   --   LABPROT 13.2  --   --  13.7  --  14.1  --  14.1  INR 1.02  --   --  1.07  --  1.11  --  1.11  CREATININE 2.01*  --  1.46*  --   --  0.84  --  0.81  < > = values in this interval not displayed.  Estimated Creatinine Clearance: 105.4 ml/min (by C-G formula based on Cr of 0.81).   Medical History: Past Medical History  Diagnosis Date  . Aortic stenosis 08/25/2010    a. Bentall aortic root replacement with a St. Jude mechanical valve and Hemashield conduit 02/2004.  . AV BLOCK, COMPLETE     a. s/p St Jude dual chamber pacemaker 02/2004.  Marland Kitchen CAD (coronary artery disease)     a. s/p CABGx2 (LIMA-dLAD, SVG-Cx). b. Low risk nuc 12/2012 without ischemia, EF 46% mild apical hypokinesia (EF 55% inf HK by echo).  . DIVERTICULOSIS, COLON 10/17/2007  . GOUT 01/16/2007  . HEMORRHOIDS, INTERNAL 11/01/2008  . HYPERLIPIDEMIA 01/16/2007  . HYPERTENSION 01/16/2007  . LEG EDEMA 11/27/2008  . Impaired glucose tolerance   . Carotid artery disease     a. 0-39% bilateral ICA stenosis, stable mild hard plaque in carotid bulbs. F/u 03/2014 recommended.  Marland Kitchen Special  screening for malignant neoplasm of prostate   . Warfarin anticoagulation     AVR  . Ejection fraction     a. EF 55% with inf HK, mild MR by echo 12/2012.  . Asthma   . Arthritis   . GERD (gastroesophageal reflux disease)     hxof     Medications:  Scheduled:  . azithromycin  500 mg Oral Daily   Followed by  . [START ON 02/14/2013] azithromycin  250 mg Oral Daily  .  ceFAZolin (ANCEF) IV  2 g Intravenous Q8H  . furosemide  40 mg Intravenous BID  . insulin aspart  0-9 Units Subcutaneous Q4H  . metoprolol succinate  25 mg Oral Daily  . pantoprazole  40 mg Oral Daily  . potassium chloride  20 mEq Oral Daily   Infusions:    Assessment: 67 yo male admitted 8/1 with lower abdomen/groin hematoma s/p bilateral inguinal hernia repair 7/30. Patient on chronic coumadin for AVR and was on lovenox bridge prior to and following surgery which is currently on hold  Bruising in scrotum reported, Hgb  remains low, Plt wnl  INR at baseline off coumadin  Per CCS, transfuse 1 unit PRBC and begin heparin without bolus today  Goal of Therapy:  Heparin level 0.3-0.7 units/ml Monitor platelets by anticoagulation protocol: Yes   Plan:   No Heparin bolus per MD  Begin Heparin 1250 units/hr (14 units/kg/hr based on adjusted body weight)  Check heparin level in 8hrs  Daily heparin level, CBC  F/u coumadin restart possibly 8/7  Loralee Pacas, PharmD, BCPS Pager: [phone] 02/13/2013,8:20 AM

## 2013-02-13 NOTE — Progress Notes (Signed)
Patient ID: Jin Capote Meller, male   DOB: 1945-08-18, 67 y.o.   MRN: [id number]    Subjective: A little sore but overall doing well, very hungry and wants more to eat, denies n/v, no bm, +flatus  Objective: Vital signs in last 24 hours: Temp:  [98.5 F (36.9 C)-100.3 F (37.9 C)] 98.5 F (36.9 C) (08/05 0504) Pulse Rate:  [81-103] 81 (08/05 0504) Resp:  [16-23] 16 (08/05 0504) BP: (106-155)/(59-84) 106/62 mmHg (08/05 0504) SpO2:  [94 %-98 %] 94 % (08/05 0504) Last BM Date: 02/09/13  Intake/Output from previous day: 08/04 0701 - 08/05 0700 In: 700 [P.O.:600; IV Piggyback:100] Out: 3000 [Urine:3000] Intake/Output this shift:    GI: soft, min tender. good bs. bruising more so in scrotum now, scrotum and penis swelling looks better today, would not be able to remove foley  Lab Results:   Recent Labs  02/11/13 0520  02/12/13 1025 02/13/13 0345  WBC 12.6*  --   --  10.3  HGB 7.8*  < > 7.6* 7.1*  HCT 23.0*  < > 22.2* 21.4*  PLT 144*  --   --  203  < > = values in this interval not displayed. BMET  Recent Labs  02/12/13 0345 02/13/13 0345  NA 133* 135  K 4.3 4.2  CL 102 101  CO2 26 28  GLUCOSE 146* 136*  BUN 23 18  CREATININE 0.84 0.81  CALCIUM 8.2* 8.4   PT/INR  Recent Labs  02/12/13 0345 02/13/13 0345  LABPROT 14.1 14.1  INR 1.11 1.11   ABG No results found for this basename: PHART, PCO2, PO2, HCO3,  in the last 72 hours  Studies/Results: No results found.  Anti-infectives: Anti-infectives   Start     Dose/Rate Route Frequency Ordered Stop   02/14/13 1000  azithromycin (ZITHROMAX) tablet 250 mg     250 mg Oral Daily 02/13/13 0619 02/18/13 0959   02/13/13 1000  azithromycin (ZITHROMAX) tablet 500 mg     500 mg Oral Daily 02/13/13 0619 02/14/13 0959   02/09/13 1400  ceFAZolin (ANCEF) IVPB 2 g/50 mL premix     2 g 100 mL/hr over 30 Minutes Intravenous 3 times per day 02/09/13 1142        Assessment/Plan: 1.  Lower abdomen/groin hematoma s/p  bilateral IH repair - 02/07/2013 - M Tsuei - : S/P 2 untis PRBC, will transfuse another unit today and start heparin gtt but with NO bolus, following h and h closely and if drops will have to d/c heparin, would hold starting coumadin until thursday  --soft diet advance as tol  --transfuse one unit PRBC today  --recheck CBC in am  --leave foley due to scrotal and penis swelling  2.  Anticoagulation for mechanical valve 3.  History of pacemaker. 4.  History of CAD    LOS: 4 days    WHITE, ELIZABETH 02/13/2013  Agree with above. Spoke with B. Crenshaw about restarting his anticoagulation. Will restart with non bolused Heparin and see how he does.  Transfuse one unit and check labs in AM. Wife in room. Has not had a BM since the end of last week, but has active bowel sounds.  Seems to be progressing.  Ovidio Kin, MD, [facility] Pager: [phone] Office phone:  [phone]

## 2013-02-14 LAB — URINALYSIS, ROUTINE W REFLEX MICROSCOPIC
Ketones, ur: NEGATIVE mg/dL
Nitrite: NEGATIVE
pH: 5.5 (ref 5.0–8.0)

## 2013-02-14 LAB — TYPE AND SCREEN
ABO/RH(D): A POS
Antibody Screen: NEGATIVE
Unit division: 0

## 2013-02-14 LAB — BASIC METABOLIC PANEL
GFR calc Af Amer: >90 mL/min (ref >90)
GFR calc non Af Amer: 87 mL/min — ABNORMAL LOW (ref >90)
Potassium: 4 mEq/L (ref 3.5–5.1)
Sodium: 137 mEq/L (ref 135–145)

## 2013-02-14 LAB — HEPARIN LEVEL (UNFRACTIONATED)
Heparin Unfractionated: 0.13 IU/mL — ABNORMAL LOW (ref 0.30–0.70)
Heparin Unfractionated: 0.44 IU/mL (ref 0.30–0.70)

## 2013-02-14 LAB — PROTIME-INR: Prothrombin Time: 14 seconds (ref 11.6–15.2)

## 2013-02-14 LAB — URINE MICROSCOPIC-ADD ON

## 2013-02-14 LAB — CBC
Hemoglobin: 8.3 g/dL — ABNORMAL LOW (ref 13.0–17.0)
RBC: 2.9 MIL/uL — ABNORMAL LOW (ref 4.22–5.81)

## 2013-02-14 LAB — GLUCOSE, CAPILLARY: Glucose-Capillary: 185 mg/dL — ABNORMAL HIGH (ref 70–99)

## 2013-02-14 MED ORDER — OMEGA-3-ACID ETHYL ESTERS 1 G PO CAPS
1.0000 g | ORAL_CAPSULE | Freq: Two times a day (BID) | ORAL | Status: DC
  Administered 2013-02-14 – 2013-02-23 (×19): 1 g via ORAL
  Filled 2013-02-14 (×21): qty 1

## 2013-02-14 MED ORDER — SIMVASTATIN 40 MG PO TABS
40.0000 mg | ORAL_TABLET | Freq: Every day | ORAL | Status: DC
  Administered 2013-02-14 – 2013-02-22 (×9): 40 mg via ORAL
  Filled 2013-02-14 (×10): qty 1

## 2013-02-14 MED ORDER — HEPARIN (PORCINE) IN NACL 100-0.45 UNIT/ML-% IJ SOLN
1700.0000 [IU]/h | INTRAMUSCULAR | Status: AC
  Administered 2013-02-14 (×2): 1700 [IU]/h via INTRAVENOUS
  Filled 2013-02-14: qty 250

## 2013-02-14 MED ORDER — FUROSEMIDE 40 MG PO TABS
40.0000 mg | ORAL_TABLET | Freq: Two times a day (BID) | ORAL | Status: DC
  Administered 2013-02-14 – 2013-02-23 (×19): 40 mg via ORAL
  Filled 2013-02-14 (×21): qty 1

## 2013-02-14 MED ORDER — HEPARIN (PORCINE) IN NACL 100-0.45 UNIT/ML-% IJ SOLN
2200.0000 [IU]/h | INTRAMUSCULAR | Status: DC
  Administered 2013-02-14 – 2013-02-15 (×3): 2200 [IU]/h via INTRAVENOUS
  Filled 2013-02-14 (×4): qty 250

## 2013-02-14 NOTE — Progress Notes (Signed)
Subjective: No real complaints, swelling of scrotum is somewhat better.  Foley is in.  He has been up some, not much.  Still has an ice pack around perineium.  No other complaints, eating well, normal BM.  Objective: Vital signs in last 24 hours: Temp:  [98.7 F (37.1 C)-100.3 F (37.9 C)] 98.7 F (37.1 C) (08/06 0602) Pulse Rate:  [53-95] 82 (08/06 0602) Resp:  [18-20] 18 (08/06 0602) BP: (99-129)/(56-70) 128/62 mmHg (08/06 0602) SpO2:  [93 %-96 %] 93 % (08/06 0602) Last BM Date: 02/13/13 Tm 100.3, VSS H/H 8.3/25.4 Diet: dysphagia III Intake/Output from previous day: 08/05 0701 - 08/06 0700 In: 666.2 [P.O.:240; I.V.:276.2; IV Piggyback:150] Out: 3850 [Urine:3850] Intake/Output this shift:    General appearance: alert, cooperative and no distress GI: soft, he has some ecchymosis midline above pubic symphysis. Male genitalia: Still has scrotal and penile swelling with foley in place.  Lab Results:   Recent Labs  02/13/13 0345 02/14/13 0359  WBC 10.3 8.9  HGB 7.1* 8.3*  HCT 21.4* 25.4*  PLT 203 229    BMET  Recent Labs  02/13/13 0345 02/14/13 0359  NA 135 137  K 4.2 4.0  CL 101 102  CO2 28 29  GLUCOSE 136* 135*  BUN 18 19  CREATININE 0.81 0.86  CALCIUM 8.4 8.3*   PT/INR  Recent Labs  02/13/13 0345 02/14/13 0359  LABPROT 14.1 14.0  INR 1.11 1.10     Recent Labs Lab 02/09/13 1058  AST 28  ALT 35  ALKPHOS 41  BILITOT 0.3  PROT 5.7*  ALBUMIN 3.2*     Lipase     Component Value Date/Time   LIPASE 14 02/09/2013 1058     Studies/Results: No results found.  Medications: . azithromycin  250 mg Oral Daily  .  ceFAZolin (ANCEF) IV  2 g Intravenous Q8H  . furosemide  40 mg Oral BID  . insulin aspart  0-9 Units Subcutaneous Q4H  . metoprolol succinate  25 mg Oral Daily  . pantoprazole  40 mg Oral Daily  . potassium chloride  20 mEq Oral Daily   Prior to Admission medications   Medication Sig Start Date End Date Taking? Authorizing  Provider  Ascorbic Acid (VITAMIN C) 500 MG tablet Take 1,000 mg by mouth daily.    Yes Historical Provider, MD  aspirin 81 MG tablet Take 81 mg by mouth daily.     Yes Historical Provider, MD  calcium carbonate (OS-CAL) 600 MG TABS Take 600 mg by mouth 2 (two) times daily with a meal.   Yes Historical Provider, MD  enoxaparin (LOVENOX) 100 MG/ML injection Inject 1 mL (100 mg total) into the skin every 12 (twelve) hours. 02/03/13  Yes Luis Abed, MD  furosemide (LASIX) 40 MG tablet Take 40 mg by mouth 2 (two) times daily.   Yes Historical Provider, MD  Glucosamine-Chondroitin 250-200 MG CAPS Take 2 capsules by mouth daily.    Yes Historical Provider, MD  loratadine (CLARITIN) 10 MG tablet Take 10 mg by mouth daily.   Yes Historical Provider, MD  Meclizine HCl (BONINE) 25 MG CHEW Chew 1 tablet by mouth daily.     Yes Historical Provider, MD  metoprolol succinate (TOPROL-XL) 100 MG 24 hr tablet Take 100 mg by mouth every morning. Take with or immediately following a meal.   Yes Historical Provider, MD  Misc Natural Products (TART CHERRY ADVANCED) CAPS Take 1 capsule by mouth every evening.   Yes Historical Provider, MD  Multiple  Vitamin (MULTIVITAMIN) tablet Take 1 tablet by mouth daily.     Yes Historical Provider, MD  nitroGLYCERIN (NITROSTAT) 0.4 MG SL tablet Place 1 tablet (0.4 mg total) under the tongue every 5 (five) minutes as needed. For chest pain 09/19/12  Yes Gordy Savers, MD  Omega-3 Fatty Acids (FISH OIL) 1000 MG CAPS Take 1,000 mg by mouth every morning.    Yes Historical Provider, MD  potassium chloride (KLOR-CON M10) 10 MEQ tablet Take 1 tablet (10 mEq total) by mouth 2 (two) times daily. 09/19/12  Yes Gordy Savers, MD  pravastatin (PRAVACHOL) 40 MG tablet Take 40 mg by mouth every evening.    Yes Historical Provider, MD  ramipril (ALTACE) 10 MG capsule Take 10 mg by mouth every morning.   Yes Historical Provider, MD  tadalafil (CIALIS) 10 MG tablet Take 1 tablet (10 mg  total) by mouth daily as needed. 04/10/12  Yes Gordy Savers, MD  traMADol (ULTRAM) 50 MG tablet Take 50 mg by mouth every 6 (six) hours as needed for pain.  07/26/12  Yes Historical Provider, MD  vitamin B-12 (CYANOCOBALAMIN) 1000 MCG tablet Take 1,000 mcg by mouth daily.   Yes Historical Provider, MD  warfarin (COUMADIN) 10 MG tablet Take 5-10 mg by mouth every evening. Takes 5 mg on Monday and 10 mg all the other days   Yes Historical Provider, MD    Assessment/Plan Lower abdomen/groin hematoma s/p bilateral IH repair - 02/07/2013 - M Tsuei - S/P tranfusion 2Units of PRBC yesterday, St. Jude AVR, Heparin started yesterday without bolus CAD/PTVP   Plan:  Continue heparin per pharmacy, recheck H/H tomorrow, try an athletic supporter, to help with mobility.  If he does well and does not drop H/H we plan to start coumadin tomorrow. He is currently on a much lower dose of Toprol, he is not on altace, I restarted his Statin. He was on Pravachol, I may have accidentally changed it to Zocor trying to restart it here.  If he stays till he's therapeutic on coumadin we may be able to try with foley out before he goes.   LOS: 5 days    JENNINGS,WILLARD 02/14/2013  Agree with above.  Ovidio Kin, MD, [facility] Surgery Pager: [phone] Office phone:  [phone]

## 2013-02-14 NOTE — Progress Notes (Signed)
   Subjective:  Denies CP or dyspnea; abdominal pain much improved   Objective:  Filed Vitals:   02/13/13 1405 02/13/13 1430 02/13/13 2128 02/14/13 0602  BP: 116/59 124/62 129/70 128/62  Pulse: 53 92 95 82  Temp: 99.9 F (37.7 C) 99.1 F (37.3 C) 100.3 F (37.9 C) 98.7 F (37.1 C)  TempSrc: Oral Oral Oral Oral  Resp: 20 19 20 18   Height:      Weight:      SpO2: 94% 96% 93% 93%    Intake/Output from previous day:  Intake/Output Summary (Last 24 hours) at 02/14/13 0710 Last data filed at 02/14/13 0602  Gross per 24 hour  Intake 666.16 ml  Output   3850 ml  Net -3183.84 ml    Physical Exam: Physical exam: Well-developed well-nourished in no acute distress.  Skin is warm and dry.  HEENT is normal.  Neck is supple.  Chest CTA Cardiovascular exam is regular rate and rhythm. Crisp mechanical valve sound Abdominal exam diffuse tenderness to palpation (improving). Extremities show trace-1+ edema in thighs. neuro grossly intact    Lab Results: Basic Metabolic Panel:  Recent Labs  45/40/98 0345 02/14/13 0359  NA 135 137  K 4.2 4.0  CL 101 102  CO2 28 29  GLUCOSE 136* 135*  BUN 18 19  CREATININE 0.81 0.86  CALCIUM 8.4 8.3*   CBC:  Recent Labs  02/13/13 0345 02/14/13 0359  WBC 10.3 8.9  HGB 7.1* 8.3*  HCT 21.4* 25.4*  MCV 89.5 87.6  PLT 203 229     Assessment/Plan:  1 H/O AVR - Heparin has been resumed; follow HGB; if stable in AM, will resume coumadin; continue heparin until INR 2.5. 2 extraperitoneal hematoma/acute blood loss anemia/hypovolemic shock - improved; no evidence of active bleeding at present; transfuse as needed; management per general surgery/CCM.  3 Acute kidney injury - most likely ATN from hypotension; resoloved. 4 HTN - BP controlled at present; continue toprol at present dose; continue lasix but change to po; follow renal function. 5 CAD/s/p CABG 6 H/O pacemaker 7 Fever-probable URI; continue azithromycin; await UA.   Olga Millers 02/14/2013, 7:10 AM

## 2013-02-14 NOTE — Progress Notes (Signed)
ANTICOAGULATION CONSULT NOTE - Follow Up  Pharmacy Consult for Heparin Indication: Mechanical Aortic Heart Valve  Allergies  Allergen Reactions  . Codeine Phosphate     REACTION: difficulty breathing  . Metoprolol Tartrate     REACTION: loss of vision    Patient Measurements: Height: 5\' 7"  (170.2 cm) Weight: 251 lb 15.8 oz (114.3 kg) IBW/kg (Calculated) : 66.1 Heparin Dosing Weight: 91.6kg  Vital Signs: Temp: 98.7 F (37.1 C) (08/06 0602) Temp src: Oral (08/06 0602) BP: 114/68 mmHg (08/06 1045) Pulse Rate: 82 (08/06 1045)  Labs:  Recent Labs  02/12/13 0345 02/12/13 1025 02/13/13 0345 02/13/13 1715 02/13/13 2330 02/14/13 0359 02/14/13 1010  HGB  --  7.6* 7.1*  --   --  8.3*  --   HCT  --  22.2* 21.4*  --   --  25.4*  --   PLT  --   --  203  --   --  229  --   LABPROT 14.1  --  14.1  --   --  14.0  --   INR 1.11  --  1.11  --   --  1.10  --   HEPARINUNFRC  --   --   --  <0.10* <0.10*  --  0.13*  CREATININE 0.84  --  0.81  --   --  0.86  --     Estimated Creatinine Clearance: 99.3 ml/min (by C-G formula based on Cr of 0.86).   Medical History: Past Medical History  Diagnosis Date  . Aortic stenosis 08/25/2010    a. [place] aortic root replacement with a St. Jude mechanical valve and Hemashield conduit 02/2004.  . AV BLOCK, COMPLETE     a. s/p St Jude dual chamber pacemaker 02/2004.  Marland Kitchen CAD (coronary artery disease)     a. s/p CABGx2 (LIMA-dLAD, SVG-Cx). b. Low risk nuc 12/2012 without ischemia, EF 46% mild apical hypokinesia (EF 55% inf HK by echo).  . DIVERTICULOSIS, COLON 10/17/2007  . GOUT 01/16/2007  . HEMORRHOIDS, INTERNAL 11/01/2008  . HYPERLIPIDEMIA 01/16/2007  . HYPERTENSION 01/16/2007  . LEG EDEMA 11/27/2008  . Impaired glucose tolerance   . Carotid artery disease     a. 0-39% bilateral ICA stenosis, stable mild hard plaque in carotid bulbs. F/u 03/2014 recommended.  Marland Kitchen Special screening for malignant neoplasm of prostate   . Warfarin anticoagulation     AVR   . Ejection fraction     a. EF 55% with inf HK, mild MR by echo 12/2012.  . Asthma   . Arthritis   . GERD (gastroesophageal reflux disease)     hxof     Medications:  Scheduled:  . azithromycin  250 mg Oral Daily  .  ceFAZolin (ANCEF) IV  2 g Intravenous Q8H  . furosemide  40 mg Oral BID  . insulin aspart  0-9 Units Subcutaneous Q4H  . metoprolol succinate  25 mg Oral Daily  . omega-3 acid ethyl esters  1 g Oral BID  . pantoprazole  40 mg Oral Daily  . potassium chloride  20 mEq Oral Daily  . simvastatin  40 mg Oral q1800   Infusions:  . heparin 1,700 Units/hr (02/14/13 6213)    Assessment: 67 yo male admitted 8/1 with lower abdomen/groin hematoma s/p bilateral inguinal hernia repair 7/30. Patient on chronic coumadin for AVR and was on lovenox bridge prior to and following surgery which is currently on hold  Bruising in scrotum reported  INR at baseline off coumadin  Heparin  started without bolus yesterday  Heparin level remains subtherapeutic but starting to trend up. (<0.1 --> 0.13). Confirmed with RN - heparin currently infusing at 1700 units/hr (17 ml/hr). No problems with IV site (previous IV site had infiltrated, IV site was changed, current IV site looks good per RN). No interruptions in therapy, no bleeding events reported.  Hgb increased after transfusion  Possible restart of coumadin tomorrow noted in PA note.  Goal of Therapy:  Heparin level 0.3-0.7 units/ml Monitor platelets by anticoagulation protocol: Yes   Plan:   Increase heparin to 2200 units/hr (22 ml/hr)  Recheck heparin level in 6 hrs  Daily heparin level, CBC  F/u coumadin restart possibly 8/7  Darrol Angel, PharmD Pager: [phone] 02/14/2013,11:06 AM

## 2013-02-14 NOTE — Progress Notes (Signed)
Rx Brief Anticoagulation consult note  Assessement:  HL=<0.10  No problems with infusion or bleeding per RN  Plan:  Increase heparin to 1700 units/hr  Recheck HL ~ 8 hrs (10am)  Lorenza Evangelist 02/14/2013 1:14 AM

## 2013-02-14 NOTE — Progress Notes (Signed)
PHARMACY BRIEF NOTE - Drug Level Result  Pharmacy Consult for:  IV Heparin Indication:  Mechanical Aortic Heart Valve  With infusion of 2200 units/hr, the heparin level drawn at 17:11 today is reported as 0.44 units/ml.  This level is within the therapeutic range, 0.3-0.7 units/ml.  The patient's nurse has not seen any signs of bleeding.  Plan:  Continue heparin at the current rate overnight.  Next level will be drawn with morning labs 8/7.  Polo Riley R.Ph. 02/14/2013 6:52 PM

## 2013-02-15 LAB — CBC
HCT: 25.3 % — ABNORMAL LOW (ref 39.0–52.0)
Hemoglobin: 8.4 g/dL — ABNORMAL LOW (ref 13.0–17.0)
MCH: 29.2 pg (ref 26.0–34.0)
MCV: 87.8 fL (ref 78.0–100.0)
RBC: 2.88 MIL/uL — ABNORMAL LOW (ref 4.22–5.81)
WBC: 9.6 10*3/uL (ref 4.0–10.5)

## 2013-02-15 LAB — PROTIME-INR: Prothrombin Time: 14.2 seconds (ref 11.6–15.2)

## 2013-02-15 LAB — GLUCOSE, CAPILLARY
Glucose-Capillary: 138 mg/dL — ABNORMAL HIGH (ref 70–99)
Glucose-Capillary: 156 mg/dL — ABNORMAL HIGH (ref 70–99)
Glucose-Capillary: 171 mg/dL — ABNORMAL HIGH (ref 70–99)

## 2013-02-15 MED ORDER — WARFARIN SODIUM 10 MG PO TABS
10.0000 mg | ORAL_TABLET | Freq: Once | ORAL | Status: AC
  Administered 2013-02-15: 10 mg via ORAL
  Filled 2013-02-15: qty 1

## 2013-02-15 MED ORDER — HEPARIN (PORCINE) IN NACL 100-0.45 UNIT/ML-% IJ SOLN
2100.0000 [IU]/h | INTRAMUSCULAR | Status: DC
Start: 2013-02-15 — End: 2013-02-16
  Administered 2013-02-15: 2100 [IU]/h via INTRAVENOUS
  Filled 2013-02-15 (×3): qty 250

## 2013-02-15 MED ORDER — WARFARIN - PHARMACIST DOSING INPATIENT
Freq: Every day | Status: DC
  Administered 2013-02-21: 18:00:00

## 2013-02-15 MED ORDER — METOPROLOL SUCCINATE ER 50 MG PO TB24
50.0000 mg | ORAL_TABLET | Freq: Every day | ORAL | Status: DC
  Administered 2013-02-15 – 2013-02-23 (×8): 50 mg via ORAL
  Filled 2013-02-15 (×9): qty 1

## 2013-02-15 NOTE — Progress Notes (Signed)
  Subjective: He feels fine, they got him an athletic supporter, but it was to small.  He has less swelling in his scrotum and penis.  Objective: Vital signs in last 24 hours: Temp:  [98 F (36.7 C)-98.9 F (37.2 C)] 98 F (36.7 C) (08/07 0547) Pulse Rate:  [80-94] 81 (08/07 0547) Resp:  [18-20] 18 (08/07 0547) BP: (108-134)/(56-72) 134/64 mmHg (08/07 0547) SpO2:  [94 %-98 %] 98 % (08/07 0547) Last BM Date: 02/14/13 480 PO recorded,  Afebrile, VSS H/H is stable Intake/Output from previous day: 08/06 0701 - 08/07 0700 In: 1040.4 [P.O.:480; I.V.:460.4; IV Piggyback:100] Out: 3025 [Urine:3025] Intake/Output this shift:    General appearance: alert, cooperative and no distress Resp: clear to auscultation bilaterally GI: soft, still some ecchymosis lower mid abd,  his penile and scrotal swelling a little better too.  Lab Results:   Recent Labs  02/14/13 0359 02/15/13 0400  WBC 8.9 9.6  HGB 8.3* 8.4*  HCT 25.4* 25.3*  PLT 229 276    BMET  Recent Labs  02/13/13 0345 02/14/13 0359  NA 135 137  K 4.2 4.0  CL 101 102  CO2 28 29  GLUCOSE 136* 135*  BUN 18 19  CREATININE 0.81 0.86  CALCIUM 8.4 8.3*   PT/INR  Recent Labs  02/14/13 0359 02/15/13 0400  LABPROT 14.0 14.2  INR 1.10 1.12     Recent Labs Lab 02/09/13 1058  AST 28  ALT 35  ALKPHOS 41  BILITOT 0.3  PROT 5.7*  ALBUMIN 3.2*     Lipase     Component Value Date/Time   LIPASE 14 02/09/2013 1058     Studies/Results: No results found.  Medications: . azithromycin  250 mg Oral Daily  .  ceFAZolin (ANCEF) IV  2 g Intravenous Q8H  . furosemide  40 mg Oral BID  . insulin aspart  0-9 Units Subcutaneous Q4H  . metoprolol succinate  50 mg Oral Daily  . omega-3 acid ethyl esters  1 g Oral BID  . pantoprazole  40 mg Oral Daily  . potassium chloride  20 mEq Oral Daily  . simvastatin  40 mg Oral q1800    Assessment/Plan Lower abdomen/groin hematoma s/p bilateral IH repair - 02/07/2013 - M  Tsuei -  S/P tranfusion 2Units of PRBC yesterday,  St. Jude AVR, Heparin started yesterday without bolus  CAD/PTVP On Ancef and azithromycin for WBC elevation and fever (day 6 ANCEF; Day 2 Azithromycin) last temp elevation 8/5 at 2100 hours. Negative Urine culture.   Plan:  We will take the foley out and see if he can void on his own, his wife is bringing Him some clothes and supportive underwear.  We will restart his coumadin, continue heparin. Dr. Jens Som would like him fully anticoagulated prior to d/c home. No growth from urine culture no further fever, so I will stop antibiotics.  LOS: 6 days    JENNINGS,WILLARD 02/15/2013  Agree with above. Looks good.  Ovidio Kin, MD, [facility] Surgery Pager: [phone] Office phone:  [phone]

## 2013-02-15 NOTE — Progress Notes (Signed)
ANTICOAGULATION CONSULT NOTE - Follow Up  Pharmacy Consult for Heparin & Warfarin Indication: Mechanical Aortic Heart Valve  Allergies  Allergen Reactions  . Codeine Phosphate     REACTION: difficulty breathing  . Metoprolol Tartrate     REACTION: loss of vision   Patient Measurements: Height: 5\' 7"  (170.2 cm) Weight: 251 lb 15.8 oz (114.3 kg) IBW/kg (Calculated) : 66.1 Heparin Dosing Weight: 91.6kg  Vital Signs: Temp: 98 F (36.7 C) (08/07 0547) Temp src: Oral (08/07 0547) BP: 134/64 mmHg (08/07 0547) Pulse Rate: 81 (08/07 0547)  Labs:  Recent Labs  02/13/13 0345  02/14/13 0359 02/14/13 1010 02/14/13 1711 02/15/13 0400  HGB 7.1*  --  8.3*  --   --  8.4*  HCT 21.4*  --  25.4*  --   --  25.3*  PLT 203  --  229  --   --  276  LABPROT 14.1  --  14.0  --   --  14.2  INR 1.11  --  1.10  --   --  1.12  HEPARINUNFRC  --   < >  --  0.13* 0.44 0.51  CREATININE 0.81  --  0.86  --   --   --   < > = values in this interval not displayed.  Estimated Creatinine Clearance: 99.3 ml/min (by C-G formula based on Cr of 0.86).  Medications:  Scheduled:  . azithromycin  250 mg Oral Daily  .  ceFAZolin (ANCEF) IV  2 g Intravenous Q8H  . furosemide  40 mg Oral BID  . insulin aspart  0-9 Units Subcutaneous Q4H  . metoprolol succinate  50 mg Oral Daily  . omega-3 acid ethyl esters  1 g Oral BID  . pantoprazole  40 mg Oral Daily  . potassium chloride  20 mEq Oral Daily  . simvastatin  40 mg Oral q1800   Infusions:  . heparin 2,200 Units/hr (02/15/13 1610)    Assessment: 67 yo male admitted 8/1 with lower abdomen/groin hematoma s/p bilateral inguinal hernia repair 7/30. Patient on chronic coumadin for AVR and was on lovenox bridge prior to and following surgery, Warfarin resumed 7/30.  Bruising in scrotum improved, hematoma resolving  Heparin started without bolus 8/5, IV infiltrated 8/5, no further problems, no sign of bleed  Heparin level therapeutic today (0.51  units/ml),INR at baseline off coumadin (1.12)  Hgb increased after transfusion (8.4)  Resume Warfarin today 8/7, Goal INR 2.5-3.5 (per Cards note 8/7)  Azithromycin may increase INR   Goal of Therapy:  Heparin level 0.3-0.7 units/ml Monitor platelets by anticoagulation protocol: Yes   Plan:   Decrease heparin to 2100 units/hr (21 ml/hr)  Coumadin 10mg  this morning  Continue daily heparin level, CBC, INR  Discontinue Heparin when INR 2.5  Otho Bellows PharmD Pager [phone] 02/15/2013, 8:20 AM

## 2013-02-15 NOTE — Progress Notes (Signed)
   Subjective:  Denies CP or dyspnea; abdominal pain much improved   Objective:  Filed Vitals:   02/14/13 1807 02/14/13 2105 02/15/13 0127 02/15/13 0547  BP: 134/72 121/56 123/63 134/64  Pulse: 94 89 91 81  Temp: 98.8 F (37.1 C) 98.9 F (37.2 C) 98.6 F (37 C) 98 F (36.7 C)  TempSrc: Oral Oral Oral Oral  Resp: 18 18 20 18   Height:      Weight:      SpO2: 94% 96% 95% 98%    Intake/Output from previous day:  Intake/Output Summary (Last 24 hours) at 02/15/13 0724 Last data filed at 02/15/13 0550  Gross per 24 hour  Intake 1040.43 ml  Output   3025 ml  Net -1984.57 ml    Physical Exam: Physical exam: Well-developed well-nourished in no acute distress.  Skin is warm and dry.  HEENT is normal.  Neck is supple.  Chest CTA Cardiovascular exam is regular rate and rhythm. Crisp mechanical valve sound Abdominal exam diffuse tenderness to palpation (improving). Extremities show trace-1+ edema in thighs. neuro grossly intact    Lab Results: Basic Metabolic Panel:  Recent Labs  16/10/96 0345 02/14/13 0359  NA 135 137  K 4.2 4.0  CL 101 102  CO2 28 29  GLUCOSE 136* 135*  BUN 18 19  CREATININE 0.81 0.86  CALCIUM 8.4 8.3*   CBC:  Recent Labs  02/14/13 0359 02/15/13 0400  WBC 8.9 9.6  HGB 8.3* 8.4*  HCT 25.4* 25.3*  MCV 87.6 87.8  PLT 229 276     Assessment/Plan:  1 H/O AVR - Heparin has been resumed; HGB unchanged; begin coumadin; continue heparin until INR 2.5. 2 extraperitoneal hematoma/acute blood loss anemia/hypovolemic shock - improved; no evidence of active bleeding at present; transfuse as needed; management per general surgery/CCM.  3 Acute kidney injury - most likely ATN from hypotension; resoloved. 4 HTN - BP controlled at present; change toprol to 50 mg daily; continue lasix; follow renal function. 5 CAD/s/p CABG 6 H/O pacemaker 7 Fever-resolved; probable URI; continue azithromycin; UA negative. Can ancef be DCed? Will leave to  surgery.   Olga Millers 02/15/2013, 7:24 AM

## 2013-02-16 LAB — CBC
MCHC: 32.2 g/dL (ref 30.0–36.0)
MCV: 88.1 fL (ref 78.0–100.0)
Platelets: 293 10*3/uL (ref 150–400)
RDW: 15.3 % (ref 11.5–15.5)
WBC: 10.2 10*3/uL (ref 4.0–10.5)

## 2013-02-16 LAB — HEPARIN LEVEL (UNFRACTIONATED): Heparin Unfractionated: 0.6 IU/mL (ref 0.30–0.70)

## 2013-02-16 LAB — BASIC METABOLIC PANEL
Calcium: 9 mg/dL (ref 8.4–10.5)
Chloride: 99 mEq/L (ref 96–112)
Creatinine, Ser: 0.74 mg/dL (ref 0.50–1.35)
GFR calc Af Amer: >90 mL/min (ref >90)
GFR calc non Af Amer: >90 mL/min (ref >90)

## 2013-02-16 LAB — GLUCOSE, CAPILLARY
Glucose-Capillary: 129 mg/dL — ABNORMAL HIGH (ref 70–99)
Glucose-Capillary: 139 mg/dL — ABNORMAL HIGH (ref 70–99)
Glucose-Capillary: 143 mg/dL — ABNORMAL HIGH (ref 70–99)

## 2013-02-16 LAB — PROTIME-INR: Prothrombin Time: 14 seconds (ref 11.6–15.2)

## 2013-02-16 MED ORDER — HEPARIN (PORCINE) IN NACL 100-0.45 UNIT/ML-% IJ SOLN
1500.0000 [IU]/h | INTRAMUSCULAR | Status: DC
Start: 2013-02-16 — End: 2013-02-20
  Administered 2013-02-16 – 2013-02-18 (×5): 2000 [IU]/h via INTRAVENOUS
  Administered 2013-02-19: 1500 [IU]/h via INTRAVENOUS
  Administered 2013-02-19: 2000 [IU]/h via INTRAVENOUS
  Filled 2013-02-16 (×11): qty 250

## 2013-02-16 MED ORDER — WARFARIN SODIUM 10 MG PO TABS
10.0000 mg | ORAL_TABLET | Freq: Once | ORAL | Status: AC
  Administered 2013-02-16: 10 mg via ORAL
  Filled 2013-02-16 (×2): qty 1

## 2013-02-16 MED ORDER — POTASSIUM CHLORIDE CRYS ER 20 MEQ PO TBCR
40.0000 meq | EXTENDED_RELEASE_TABLET | Freq: Once | ORAL | Status: AC
  Administered 2013-02-16: 40 meq via ORAL
  Filled 2013-02-16: qty 2

## 2013-02-16 NOTE — Progress Notes (Signed)
ANTICOAGULATION CONSULT NOTE - Follow Up  Pharmacy Consult for Heparin & Warfarin Indication: Mechanical Aortic Heart Valve  Allergies  Allergen Reactions  . Codeine Phosphate     REACTION: difficulty breathing  . Metoprolol Tartrate     REACTION: loss of vision   Patient Measurements: Height: 5\' 7"  (170.2 cm) Weight: 251 lb 15.8 oz (114.3 kg) IBW/kg (Calculated) : 66.1 Heparin Dosing Weight: 91.6kg  Vital Signs: Temp: 98.3 F (36.8 C) (08/08 0600) Temp src: Oral (08/08 0600) BP: 107/60 mmHg (08/08 0600) Pulse Rate: 69 (08/08 0600)  Labs:  Recent Labs  02/14/13 0359  02/14/13 1711 02/15/13 0400 02/16/13 0435  HGB 8.3*  --   --  8.4* 8.8*  HCT 25.4*  --   --  25.3* 27.3*  PLT 229  --   --  276 293  LABPROT 14.0  --   --  14.2 14.0  INR 1.10  --   --  1.12 1.10  HEPARINUNFRC  --   < > 0.44 0.51 0.60  CREATININE 0.86  --   --   --  0.74  < > = values in this interval not displayed.  Estimated Creatinine Clearance: 106.8 ml/min (by C-G formula based on Cr of 0.74).  Medications:  Scheduled:  . furosemide  40 mg Oral BID  . insulin aspart  0-9 Units Subcutaneous Q4H  . metoprolol succinate  50 mg Oral Daily  . omega-3 acid ethyl esters  1 g Oral BID  . pantoprazole  40 mg Oral Daily  . potassium chloride  20 mEq Oral Daily  . potassium chloride  40 mEq Oral Once  . simvastatin  40 mg Oral q1800  . Warfarin - Pharmacist Dosing Inpatient   Does not apply q1800   Infusions:  . heparin 2,100 Units/hr (02/15/13 2050)    Assessment: 67 yo male admitted 8/1 with lower abdomen/groin hematoma s/p bilateral inguinal hernia repair 7/30. Patient on chronic coumadin for AVR and was on lovenox bridge prior to and following surgery, Warfarin resumed 7/30.  Bruising in scrotum improved, hematoma resolving  Heparin started without bolus 8/5  Heparin level therapeutic today (0.6) and trending up  Warfarin resumed 8/7, INR subtherapeutic as expected (1.1)  Hgb has  remained stable, no bleeding reported   Goal of Therapy:  INR 2.5 - 2.5 per Cardiology Heparin level 0.3-0.7 units/ml Monitor platelets by anticoagulation protocol: Yes   Plan:   Decrease heparin to 2000 units/hr to prevent further increase  Repeat warfarin 10mg  this morning at 10am  Continue daily heparin level, CBC, INR  Discontinue Heparin when INR 2.5  Loralee Pacas, PharmD, BCPS Pager: [phone]  02/16/2013, 7:01 AM

## 2013-02-16 NOTE — Progress Notes (Signed)
   Subjective:  Denies CP or dyspnea; mild abdominal pain much improved   Objective:  Filed Vitals:   02/15/13 0547 02/15/13 1400 02/15/13 2112 02/16/13 0600  BP: 134/64 138/62 117/70 107/60  Pulse: 81 75 89 69  Temp: 98 F (36.7 C) 99.1 F (37.3 C) 99.8 F (37.7 C) 98.3 F (36.8 C)  TempSrc: Oral Oral Oral Oral  Resp: 18 18 18 18   Height:      Weight:      SpO2: 98% 97% 96% 95%    Intake/Output from previous day:  Intake/Output Summary (Last 24 hours) at 02/16/13 4098 Last data filed at 02/16/13 0600  Gross per 24 hour  Intake 630.37 ml  Output   2325 ml  Net -1694.63 ml    Physical Exam: Physical exam: Well-developed well-nourished in no acute distress.  Skin is warm and dry.  HEENT is normal.  Neck is supple.  Chest CTA Cardiovascular exam is regular rate and rhythm. Crisp mechanical valve sound Abdominal exam tender mid lower abdomen but much improved Extremities show no edema neuro grossly intact    Lab Results: Basic Metabolic Panel:  Recent Labs  11/91/47 0359 02/16/13 0435  NA 137 136  K 4.0 3.7  CL 102 99  CO2 29 30  GLUCOSE 135* 139*  BUN 19 17  CREATININE 0.86 0.74  CALCIUM 8.3* 9.0   CBC:  Recent Labs  02/15/13 0400 02/16/13 0435  WBC 9.6 10.2  HGB 8.4* 8.8*  HCT 25.3* 27.3*  MCV 87.8 88.1  PLT 276 293     Assessment/Plan:  1 H/O AVR - Heparin has been resumed; HGB unchanged; continue coumadin; continue heparin until INR 2.5. Will resume ASA 81 mg daily in 2-4 weeks if Hgb remains stable. 2 extraperitoneal hematoma/acute blood loss anemia/hypovolemic shock - improved; no evidence of active bleeding at present; transfuse as needed; management per general surgery/CCM.  3 Acute kidney injury - most likely ATN from hypotension; resoloved. 4 HTN - BP controlled at present; continue toprol; continue lasix. Will not resume altace at present as BP borderline; can be added as outpatient if needed. 5 CAD/s/p CABG 6 H/O  pacemaker    Olga Millers 02/16/2013, 6:39 AM

## 2013-02-16 NOTE — Progress Notes (Signed)
General Surgery Note  LOS: 7 days  POD -  #8  Assessment/Plan: 1. Lower abdomen/groin hematoma after bilateral laparoscopic IH repair 02/07/2013 - M Tsuei   This now appears stable.  2. Anticoagulation   The patient has been on Heparin and is stable. Coumadin has been added.  Will want patient fully anticoagulated prior to discharge.  PT/INR - 14.0/1.0 - 02/16/2013 3.  History of pacemaker.  4.  [employer] Jude  AVR with mechanical valve 5.  CAD with history of CABG 6.  Foley removed yesterday - he has done well with this out.  Subjective:  Patient is doing well.  Eating well. No pain or issues.  Objective:   Filed Vitals:   02/16/13 0600  BP: 107/60  Pulse: 69  Temp: 98.3 F (36.8 C)  Resp: 18     Intake/Output from previous day:  08/07 0701 - 08/08 0700 In: 630.4 [P.O.:120; I.V.:510.4] Out: 2325 [Urine:2325]  Intake/Output this shift:      Physical Exam:   General: WN WM who is alert and oriented.    HEENT: Normal. Pupils equal. .   Lungs: Clear   Abdomen: Soft.  BS present   Wound: Good   Lab Results:    Recent Labs  02/15/13 0400 02/16/13 0435  WBC 9.6 10.2  HGB 8.4* 8.8*  HCT 25.3* 27.3*  PLT 276 293    BMET   Recent Labs  02/14/13 0359 02/16/13 0435  NA 137 136  K 4.0 3.7  CL 102 99  CO2 29 30  GLUCOSE 135* 139*  BUN 19 17  CREATININE 0.86 0.74  CALCIUM 8.3* 9.0    PT/INR   Recent Labs  02/15/13 0400 02/16/13 0435  LABPROT 14.2 14.0  INR 1.12 1.10    ABG  No results found for this basename: PHART, PCO2, PO2, HCO3,  in the last 72 hours   Studies/Results:  No results found.   Anti-infectives:   Anti-infectives   Start     Dose/Rate Route Frequency Ordered Stop   02/14/13 1000  azithromycin (ZITHROMAX) tablet 250 mg  Status:  Discontinued     250 mg Oral Daily 02/13/13 0619 02/15/13 0844   02/13/13 1000  azithromycin (ZITHROMAX) tablet 500 mg     500 mg Oral Daily 02/13/13 0619 02/13/13 0940   02/09/13 1400  ceFAZolin (ANCEF) IVPB  2 g/50 mL premix  Status:  Discontinued     2 g 100 mL/hr over 30 Minutes Intravenous 3 times per day 02/09/13 1142 02/15/13 0844      Ovidio Kin, MD, FACS Pager: [phone],   [facility] Office: [phone] 02/16/2013

## 2013-02-17 LAB — CBC
HCT: 29 % — ABNORMAL LOW (ref 39.0–52.0)
Hemoglobin: 9.3 g/dL — ABNORMAL LOW (ref 13.0–17.0)
MCH: 28.4 pg (ref 26.0–34.0)
MCHC: 32.1 g/dL (ref 30.0–36.0)
MCV: 88.4 fL (ref 78.0–100.0)
Platelets: 339 10*3/uL (ref 150–400)
RDW: 15.1 % (ref 11.5–15.5)

## 2013-02-17 LAB — GLUCOSE, CAPILLARY
Glucose-Capillary: 138 mg/dL — ABNORMAL HIGH (ref 70–99)
Glucose-Capillary: 131 mg/dL — ABNORMAL HIGH (ref 70–99)
Glucose-Capillary: 153 mg/dL — ABNORMAL HIGH (ref 70–99)
Glucose-Capillary: 131 mg/dL — ABNORMAL HIGH (ref 70–99)

## 2013-02-17 MED ORDER — WARFARIN SODIUM 2.5 MG PO TABS
12.5000 mg | ORAL_TABLET | Freq: Once | ORAL | Status: AC
  Administered 2013-02-17: 12.5 mg via ORAL
  Filled 2013-02-17: qty 1

## 2013-02-17 MED ORDER — INSULIN ASPART 100 UNIT/ML ~~LOC~~ SOLN
0.0000 [IU] | Freq: Three times a day (TID) | SUBCUTANEOUS | Status: DC
Start: 2013-02-18 — End: 2013-02-23
  Administered 2013-02-18 (×2): 1 [IU] via SUBCUTANEOUS
  Administered 2013-02-19: 2 [IU] via SUBCUTANEOUS
  Administered 2013-02-19 – 2013-02-20 (×2): 1 [IU] via SUBCUTANEOUS
  Administered 2013-02-20: 2 [IU] via SUBCUTANEOUS
  Administered 2013-02-20 – 2013-02-21 (×3): 1 [IU] via SUBCUTANEOUS
  Administered 2013-02-22: 2 [IU] via SUBCUTANEOUS

## 2013-02-17 NOTE — Progress Notes (Signed)
Subjective:  Walking in room. No CP, no SOB, no syncope, no CVA symptoms.   Objective:  Vital Signs in the last 24 hours: Temp:  [98.4 F (36.9 C)-98.9 F (37.2 C)] 98.4 F (36.9 C) (08/09 0500) Pulse Rate:  [78-83] 80 (08/09 0500) Resp:  [18-20] 18 (08/09 0500) BP: (118-151)/(72-80) 118/72 mmHg (08/09 0500) SpO2:  [94 %-97 %] 94 % (08/09 0500)  Intake/Output from previous day: 08/08 0701 - 08/09 0700 In: 1199.7 [P.O.:720; I.V.:479.7] Out: 3050 [Urine:3050]   Physical Exam: General: Well developed, well nourished, in no acute distress. Head:  Normocephalic and atraumatic. Lungs: Clear to auscultation and percussion. Heart: Normal S1 and sharp S2 click.  No murmur, rubs or gallops.  Abdomen: soft, non-tender, positive bowel sounds. Extremities: No clubbing or cyanosis. No edema. Neurologic: Alert and oriented x 3.    Lab Results:  Recent Labs  02/16/13 0435 02/17/13 0432  WBC 10.2 11.1*  HGB 8.8* 9.3*  PLT 293 339    Recent Labs  02/16/13 0435  NA 136  K 3.7  CL 99  CO2 30  GLUCOSE 139*  BUN 17  CREATININE 0.74     Telemetry: None  Assessment/Plan:  Principal Problem:   Abdominal pain Active Problems:   Anuria   Acute renal failure   Hypotension   Anemia   Hyperglycemia   Acute blood loss anemia   Leukocytosis   Lactic acidosis   1 H/O AVR - Heparin has been resumed; HGB unchanged; continue coumadin; continue heparin until INR 2.5 (although with mechanical aortic valve would not be unreasonable to run at 2-3 INR.) Will resume ASA 81 mg daily in 2-4 weeks if Hgb remains stable. INR 1.09 now  2 extraperitoneal hematoma/acute blood loss anemia/hypovolemic shock - improved; no evidence of active bleeding at present; transfuse as needed; management per general surgery/CCM.   3 Acute kidney injury - most likely ATN from hypotension; resoloved.   4 HTN - BP controlled at present; continue toprol; continue lasix. Will not resume altace at present as  BP borderline; can be added as outpatient if needed.   5 CAD/s/p CABG   6 H/O pacemaker  7 HL - simvastatin   Shakora Nordquist 02/17/2013, 8:05 AM

## 2013-02-17 NOTE — Progress Notes (Deleted)
ANTICOAGULATION CONSULT NOTE - Follow Up  Pharmacy Consult for Heparin & Warfarin Indication: Mechanical Aortic Heart Valve  Allergies  Allergen Reactions  . Codeine Phosphate     REACTION: difficulty breathing  . Metoprolol Tartrate     REACTION: loss of vision   Patient Measurements: Height: 5\' 7"  (170.2 cm) Weight: 251 lb 15.8 oz (114.3 kg) IBW/kg (Calculated) : 66.1 Heparin Dosing Weight: 91.6kg  Vital Signs: Temp: 98.4 F (36.9 C) (08/09 0500) Temp src: Oral (08/09 0500) BP: 118/72 mmHg (08/09 0500) Pulse Rate: 80 (08/09 0500)  Labs:  Recent Labs  02/15/13 0400 02/16/13 0435 02/17/13 0432  HGB 8.4* 8.8* 9.3*  HCT 25.3* 27.3* 29.0*  PLT 276 293 339  LABPROT 14.2 14.0 13.9  INR 1.12 1.10 1.09  HEPARINUNFRC 0.51 0.60 0.37  CREATININE  --  0.74  --     Estimated Creatinine Clearance: 106.8 ml/min (by C-G formula based on Cr of 0.74).  Medications:  Scheduled:  . furosemide  40 mg Oral BID  . insulin aspart  0-9 Units Subcutaneous Q4H  . metoprolol succinate  50 mg Oral Daily  . omega-3 acid ethyl esters  1 g Oral BID  . pantoprazole  40 mg Oral Daily  . potassium chloride  20 mEq Oral Daily  . simvastatin  40 mg Oral q1800  . Warfarin - Pharmacist Dosing Inpatient   Does not apply q1800   Infusions:  . heparin 2,000 Units/hr (02/16/13 2148)    Assessment: 67 yo male admitted 8/1 with lower abdomen/groin hematoma s/p bilateral inguinal hernia repair 7/30. Patient on chronic coumadin for AVR and was on lovenox bridge prior to and following surgery, Warfarin resumed 7/30.  Home dose of wafarin was 10 mg daily except 5 mg on mondays  Bruising in scrotum improved, hematoma resolving  Heparin started without bolus 8/5  Heparin level therapeutic today (0.37)   Warfarin resumed 8/7, INR subtherapeutic during reinitation as expected (1.09)  Hgb has remained stable, no bleeding reported   Goal of Therapy:  INR 2.5 - 2.5 per Cardiology Heparin level  0.3-0.7 units/ml Monitor platelets by anticoagulation protocol: Yes   Plan:   Continue heparin to 2000 units/hr  Increase warfarin dose today to 12.5 mg x 1 dose   Continue daily heparin level, CBC, INR  Discontinue Heparin when INR 2.5  Henson Fraticelli, Loma Messing PharmD Pager #: [phone] 7:22 AM 02/17/2013

## 2013-02-17 NOTE — Progress Notes (Signed)
ANTICOAGULATION CONSULT NOTE - Follow Up  Pharmacy Consult for Heparin & Warfarin Indication: Mechanical Aortic Heart Valve  Allergies  Allergen Reactions  . Codeine Phosphate     REACTION: difficulty breathing  . Metoprolol Tartrate     REACTION: loss of vision   Patient Measurements: Height: 5\' 7"  (170.2 cm) Weight: 251 lb 15.8 oz (114.3 kg) IBW/kg (Calculated) : 66.1 Heparin Dosing Weight: 91.6kg  Vital Signs: Temp: 98.4 F (36.9 C) (08/09 0500) Temp src: Oral (08/09 0500) BP: 118/72 mmHg (08/09 0500) Pulse Rate: 80 (08/09 0500)  Labs:  Recent Labs  02/15/13 0400 02/16/13 0435 02/17/13 0432  HGB 8.4* 8.8* 9.3*  HCT 25.3* 27.3* 29.0*  PLT 276 293 339  LABPROT 14.2 14.0 13.9  INR 1.12 1.10 1.09  HEPARINUNFRC 0.51 0.60 0.37  CREATININE  --  0.74  --     Estimated Creatinine Clearance: 106.8 ml/min (by C-G formula based on Cr of 0.74).  Medications:  Scheduled:  . furosemide  40 mg Oral BID  . insulin aspart  0-9 Units Subcutaneous Q4H  . metoprolol succinate  50 mg Oral Daily  . omega-3 acid ethyl esters  1 g Oral BID  . pantoprazole  40 mg Oral Daily  . potassium chloride  20 mEq Oral Daily  . simvastatin  40 mg Oral q1800  . Warfarin - Pharmacist Dosing Inpatient   Does not apply q1800   Infusions:  . heparin 2,000 Units/hr (02/16/13 2148)    Assessment: 67 yo male admitted 8/1 with lower abdomen/groin hematoma s/p bilateral inguinal hernia repair 7/30. Patient on chronic coumadin for [employer]. [employer] AVR with mechanical valve and was on lovenox bridge prior to and following surgery, Warfarin resumed 7/30.  Bruising in scrotum improved, hematoma resolving  Heparin started without bolus 8/5  Heparin level therapeutic today  Warfarin resumed 8/7, INR subtherapeutic as expected  Hgb has remained stable, no bleeding reported   Goal of Therapy:  Continue Heparin until INR 2.5 per Cardiology Heparin level 0.3-0.7 units/ml Monitor platelets by  anticoagulation protocol: Yes   Plan:   Continue heparin infusion at 2000 units/hr.  Warfarin 12.5 mg this morning at 10am  Continue daily heparin level, CBC, INR  Discontinue Heparin when INR 2.5  Clance Boll, PharmD, BCPS Pager: [phone] 02/17/2013 7:13 AM

## 2013-02-17 NOTE — Progress Notes (Signed)
Patient ID: [name], male   DOB: Dec 05, 1945, 67 y.o.   MRN: [id number] [facility] Surgery Progress Note:   * No surgery found *  Subjective: Mental status is mildly confused Objective: Vital signs in last 24 hours: Temp:  [98.4 F (36.9 C)-98.9 F (37.2 C)] 98.4 F (36.9 C) (08/09 0500) Pulse Rate:  [78-83] 80 (08/09 0500) Resp:  [18-20] 18 (08/09 0500) BP: (118-151)/(72-80) 118/72 mmHg (08/09 0500) SpO2:  [94 %-97 %] 94 % (08/09 0500)  Intake/Output from previous day: 08/08 0701 - 08/09 0700 In: 1199.7 [P.O.:720; I.V.:479.7] Out: 3050 [Urine:3050] Intake/Output this shift:    Physical Exam: Work of breathing is normal  Lab Results:  Results for orders placed during the hospital encounter of 02/09/13 (from the past 48 hour(s))  GLUCOSE, CAPILLARY     Status: Abnormal   Collection Time    02/15/13 12:26 PM      Result Value Range   Glucose-Capillary 132 (*) 70 - 99 mg/dL  GLUCOSE, CAPILLARY     Status: Abnormal   Collection Time    02/15/13  4:17 PM      Result Value Range   Glucose-Capillary 156 (*) 70 - 99 mg/dL  GLUCOSE, CAPILLARY     Status: Abnormal   Collection Time    02/15/13  7:16 PM      Result Value Range   Glucose-Capillary 171 (*) 70 - 99 mg/dL  GLUCOSE, CAPILLARY     Status: Abnormal   Collection Time    02/15/13 11:36 PM      Result Value Range   Glucose-Capillary 146 (*) 70 - 99 mg/dL  GLUCOSE, CAPILLARY     Status: Abnormal   Collection Time    02/16/13  3:36 AM      Result Value Range   Glucose-Capillary 129 (*) 70 - 99 mg/dL  PROTIME-INR     Status: None   Collection Time    02/16/13  4:35 AM      Result Value Range   Prothrombin Time 14.0  11.6 - 15.2 seconds   INR 1.10  0.00 - 1.49  HEPARIN LEVEL (UNFRACTIONATED)     Status: None   Collection Time    02/16/13  4:35 AM      Result Value Range   Heparin Unfractionated 0.60  0.30 - 0.70 IU/mL   Comment:            IF HEPARIN RESULTS ARE BELOW     EXPECTED VALUES, AND  PATIENT     DOSAGE HAS BEEN CONFIRMED,     SUGGEST FOLLOW UP TESTING     OF ANTITHROMBIN III LEVELS.  CBC     Status: Abnormal   Collection Time    02/16/13  4:35 AM      Result Value Range   WBC 10.2  4.0 - 10.5 K/uL   RBC 3.10 (*) 4.22 - 5.81 MIL/uL   Hemoglobin 8.8 (*) 13.0 - 17.0 g/dL   HCT 40.9 (*) 81.1 - 91.4 %   MCV 88.1  78.0 - 100.0 fL   MCH 28.4  26.0 - 34.0 pg   MCHC 32.2  30.0 - 36.0 g/dL   RDW 78.2  95.6 - 21.3 %   Platelets 293  150 - 400 K/uL  BASIC METABOLIC PANEL     Status: Abnormal   Collection Time    02/16/13  4:35 AM      Result Value Range   Sodium 136  135 - 145 mEq/L  Potassium 3.7  3.5 - 5.1 mEq/L   Chloride 99  96 - 112 mEq/L   CO2 30  19 - 32 mEq/L   Glucose, Bld 139 (*) 70 - 99 mg/dL   BUN 17  6 - 23 mg/dL   Creatinine, Ser 1.61  0.50 - 1.35 mg/dL   Calcium 9.0  8.4 - 09.6 mg/dL   GFR calc non Af Amer >90  >90 mL/min   GFR calc Af Amer >90  >90 mL/min   Comment:            The eGFR has been calculated     using the CKD EPI equation.     This calculation has not been     validated in all clinical     situations.     eGFR's persistently     <90 mL/min signify     possible Chronic Kidney Disease.  GLUCOSE, CAPILLARY     Status: Abnormal   Collection Time    02/16/13  7:30 AM      Result Value Range   Glucose-Capillary 129 (*) 70 - 99 mg/dL   Comment 1 Notify RN     Comment 2 Documented in Chart    GLUCOSE, CAPILLARY     Status: Abnormal   Collection Time    02/16/13 11:21 AM      Result Value Range   Glucose-Capillary 146 (*) 70 - 99 mg/dL   Comment 1 Notify RN     Comment 2 Documented in Chart    GLUCOSE, CAPILLARY     Status: Abnormal   Collection Time    02/16/13  4:21 PM      Result Value Range   Glucose-Capillary 139 (*) 70 - 99 mg/dL   Comment 1 Notify RN     Comment 2 Documented in Chart    GLUCOSE, CAPILLARY     Status: Abnormal   Collection Time    02/16/13  7:42 PM      Result Value Range   Glucose-Capillary 143 (*)  70 - 99 mg/dL  GLUCOSE, CAPILLARY     Status: Abnormal   Collection Time    02/17/13 12:16 AM      Result Value Range   Glucose-Capillary 137 (*) 70 - 99 mg/dL  PROTIME-INR     Status: None   Collection Time    02/17/13  4:32 AM      Result Value Range   Prothrombin Time 13.9  11.6 - 15.2 seconds   INR 1.09  0.00 - 1.49  HEPARIN LEVEL (UNFRACTIONATED)     Status: None   Collection Time    02/17/13  4:32 AM      Result Value Range   Heparin Unfractionated 0.37  0.30 - 0.70 IU/mL   Comment:            IF HEPARIN RESULTS ARE BELOW     EXPECTED VALUES, AND PATIENT     DOSAGE HAS BEEN CONFIRMED,     SUGGEST FOLLOW UP TESTING     OF ANTITHROMBIN III LEVELS.  CBC     Status: Abnormal   Collection Time    02/17/13  4:32 AM      Result Value Range   WBC 11.1 (*) 4.0 - 10.5 K/uL   RBC 3.28 (*) 4.22 - 5.81 MIL/uL   Hemoglobin 9.3 (*) 13.0 - 17.0 g/dL   HCT 04.5 (*) 40.9 - 81.1 %   MCV 88.4  78.0 - 100.0 fL   MCH  28.4  26.0 - 34.0 pg   MCHC 32.1  30.0 - 36.0 g/dL   RDW 30.8  65.7 - 84.6 %   Platelets 339  150 - 400 K/uL  GLUCOSE, CAPILLARY     Status: Abnormal   Collection Time    02/17/13  4:38 AM      Result Value Range   Glucose-Capillary 138 (*) 70 - 99 mg/dL    Radiology/Results: No results found.  Anti-infectives: Anti-infectives   Start     Dose/Rate Route Frequency Ordered Stop   02/14/13 1000  azithromycin (ZITHROMAX) tablet 250 mg  Status:  Discontinued     250 mg Oral Daily 02/13/13 0619 02/15/13 0844   02/13/13 1000  azithromycin (ZITHROMAX) tablet 500 mg     500 mg Oral Daily 02/13/13 0619 02/13/13 0940   02/09/13 1400  ceFAZolin (ANCEF) IVPB 2 g/50 mL premix  Status:  Discontinued     2 g 100 mL/hr over 30 Minutes Intravenous 3 times per day 02/09/13 1142 02/15/13 0844      Assessment/Plan: Problem List: Patient Active Problem List   Diagnosis Date Noted  . Abdominal pain 02/09/2013  . Anuria 02/09/2013  . Acute renal failure 02/09/2013  .  Hypotension 02/09/2013  . Anemia 02/09/2013  . Hyperglycemia 02/09/2013  . Acute blood loss anemia 02/09/2013  . Leukocytosis 02/09/2013  . Lactic acidosis 02/09/2013  . Preop cardiovascular exam 01/23/2013  . Mitral regurgitation 01/21/2013  . Recurrent right inguinal hernia 01/02/2013  . Left inguinal hernia 01/02/2013  . Impaired glucose tolerance   . Carotid artery disease   . Warfarin anticoagulation   . Ejection fraction   . Hx of CABG   . Aortic stenosis 08/25/2010  . DIZZINESS 06/19/2009  . AORTIC VALVE REPLACEMENT, HX OF 05/23/2009  . LEG EDEMA 11/27/2008  . AV BLOCK, COMPLETE 11/01/2008  . PACEMAKER, PERMANENT 11/01/2008  . DIVERTICULOSIS, COLON 10/17/2007  . GOUT 01/16/2007  . CAD (coronary artery disease) 01/16/2007  . HYPERLIPIDEMIA 01/16/2007  . HYPERTENSION 01/16/2007    Anticoagulation is in progress per cardiology.   * No surgery found *    LOS: 8 days   Matt B. Daphine Deutscher, MD, [facility] [phone] beeper [phone]  02/17/2013 8:10 AM

## 2013-02-18 LAB — CBC
Hemoglobin: 10.3 g/dL — ABNORMAL LOW (ref 13.0–17.0)
MCH: 29.7 pg (ref 26.0–34.0)
RBC: 3.47 MIL/uL — ABNORMAL LOW (ref 4.22–5.81)

## 2013-02-18 LAB — PROTIME-INR: INR: 1.14 (ref 0.00–1.49)

## 2013-02-18 LAB — GLUCOSE, CAPILLARY: Glucose-Capillary: 142 mg/dL — ABNORMAL HIGH (ref 70–99)

## 2013-02-18 MED ORDER — TRAMADOL HCL 50 MG PO TABS
50.0000 mg | ORAL_TABLET | Freq: Four times a day (QID) | ORAL | Status: DC | PRN
  Administered 2013-02-18 – 2013-02-22 (×7): 50 mg via ORAL
  Filled 2013-02-18 (×7): qty 1

## 2013-02-18 MED ORDER — WARFARIN SODIUM 7.5 MG PO TABS
15.0000 mg | ORAL_TABLET | Freq: Once | ORAL | Status: AC
  Administered 2013-02-18: 15 mg via ORAL
  Filled 2013-02-18: qty 2

## 2013-02-18 NOTE — Progress Notes (Signed)
Patient ID: [name], male   DOB: [birth year], 67 y.o.   MRN: [id number] [facility] Surgery Progress Note:   * No surgery found *  Subjective: Mental status is clearer today.   Objective: Vital signs in last 24 hours: Temp:  [97.3 F (36.3 C)-98.4 F (36.9 C)] 98.2 F (36.8 C) (08/10 0526) Pulse Rate:  [73-95] 95 (08/10 0526) Resp:  [18] 18 (08/10 0526) BP: (111-147)/(69-88) 115/78 mmHg (08/10 0526) SpO2:  [96 %-97 %] 96 % (08/10 0526)  Intake/Output from previous day: 08/09 0701 - 08/10 0700 In: 482.7 [I.V.:482.7] Out: 3900 [Urine:3900] Intake/Output this shift:    Physical Exam: Work of breathing is normal.  No complaints.    Lab Results:  Results for orders placed during the hospital encounter of 02/09/13 (from the past 48 hour(s))  GLUCOSE, CAPILLARY     Status: Abnormal   Collection Time    02/16/13 11:21 AM      Result Value Range   Glucose-Capillary 146 (*) 70 - 99 mg/dL   Comment 1 Notify RN     Comment 2 Documented in Chart    GLUCOSE, CAPILLARY     Status: Abnormal   Collection Time    02/16/13  4:21 PM      Result Value Range   Glucose-Capillary 139 (*) 70 - 99 mg/dL   Comment 1 Notify RN     Comment 2 Documented in Chart    GLUCOSE, CAPILLARY     Status: Abnormal   Collection Time    02/16/13  7:42 PM      Result Value Range   Glucose-Capillary 143 (*) 70 - 99 mg/dL  GLUCOSE, CAPILLARY     Status: Abnormal   Collection Time    02/17/13 12:16 AM      Result Value Range   Glucose-Capillary 137 (*) 70 - 99 mg/dL  PROTIME-INR     Status: None   Collection Time    02/17/13  4:32 AM      Result Value Range   Prothrombin Time 13.9  11.6 - 15.2 seconds   INR 1.09  0.00 - 1.49  HEPARIN LEVEL (UNFRACTIONATED)     Status: None   Collection Time    02/17/13  4:32 AM      Result Value Range   Heparin Unfractionated 0.37  0.30 - 0.70 IU/mL   Comment:            IF HEPARIN RESULTS ARE BELOW     EXPECTED VALUES, AND PATIENT     DOSAGE HAS BEEN  CONFIRMED,     SUGGEST FOLLOW UP TESTING     OF ANTITHROMBIN III LEVELS.  CBC     Status: Abnormal   Collection Time    02/17/13  4:32 AM      Result Value Range   WBC 11.1 (*) 4.0 - 10.5 K/uL   RBC 3.28 (*) 4.22 - 5.81 MIL/uL   Hemoglobin 9.3 (*) 13.0 - 17.0 g/dL   HCT 40.9 (*) 81.1 - 91.4 %   MCV 88.4  78.0 - 100.0 fL   MCH 28.4  26.0 - 34.0 pg   MCHC 32.1  30.0 - 36.0 g/dL   RDW 78.2  95.6 - 21.3 %   Platelets 339  150 - 400 K/uL  GLUCOSE, CAPILLARY     Status: Abnormal   Collection Time    02/17/13  4:38 AM      Result Value Range   Glucose-Capillary 138 (*) 70 - 99  mg/dL  GLUCOSE, CAPILLARY     Status: Abnormal   Collection Time    02/17/13  8:17 AM      Result Value Range   Glucose-Capillary 131 (*) 70 - 99 mg/dL  GLUCOSE, CAPILLARY     Status: Abnormal   Collection Time    02/17/13 11:32 AM      Result Value Range   Glucose-Capillary 183 (*) 70 - 99 mg/dL  GLUCOSE, CAPILLARY     Status: Abnormal   Collection Time    02/17/13  4:09 PM      Result Value Range   Glucose-Capillary 114 (*) 70 - 99 mg/dL  GLUCOSE, CAPILLARY     Status: Abnormal   Collection Time    02/17/13  7:16 PM      Result Value Range   Glucose-Capillary 153 (*) 70 - 99 mg/dL  GLUCOSE, CAPILLARY     Status: Abnormal   Collection Time    02/17/13 10:10 PM      Result Value Range   Glucose-Capillary 131 (*) 70 - 99 mg/dL  PROTIME-INR     Status: None   Collection Time    02/18/13  5:00 AM      Result Value Range   Prothrombin Time 14.4  11.6 - 15.2 seconds   INR 1.14  0.00 - 1.49  HEPARIN LEVEL (UNFRACTIONATED)     Status: None   Collection Time    02/18/13  5:00 AM      Result Value Range   Heparin Unfractionated 0.59  0.30 - 0.70 IU/mL   Comment:            IF HEPARIN RESULTS ARE BELOW     EXPECTED VALUES, AND PATIENT     DOSAGE HAS BEEN CONFIRMED,     SUGGEST FOLLOW UP TESTING     OF ANTITHROMBIN III LEVELS.  CBC     Status: Abnormal   Collection Time    02/18/13  5:00 AM       Result Value Range   WBC 11.5 (*) 4.0 - 10.5 K/uL   RBC 3.47 (*) 4.22 - 5.81 MIL/uL   Hemoglobin 10.3 (*) 13.0 - 17.0 g/dL   HCT 21.3 (*) 08.6 - 57.8 %   MCV 89.0  78.0 - 100.0 fL   MCH 29.7  26.0 - 34.0 pg   MCHC 33.3  30.0 - 36.0 g/dL   RDW 46.9  62.9 - 52.8 %   Platelets 370  150 - 400 K/uL    Radiology/Results: No results found.  Anti-infectives: Anti-infectives   Start     Dose/Rate Route Frequency Ordered Stop   02/14/13 1000  azithromycin (ZITHROMAX) tablet 250 mg  Status:  Discontinued     250 mg Oral Daily 02/13/13 0619 02/15/13 0844   02/13/13 1000  azithromycin (ZITHROMAX) tablet 500 mg     500 mg Oral Daily 02/13/13 0619 02/13/13 0940   02/09/13 1400  ceFAZolin (ANCEF) IVPB 2 g/50 mL premix  Status:  Discontinued     2 g 100 mL/hr over 30 Minutes Intravenous 3 times per day 02/09/13 1142 02/15/13 0844      Assessment/Plan: Problem List: Patient Active Problem List   Diagnosis Date Noted  . Abdominal pain 02/09/2013  . Anuria 02/09/2013  . Acute renal failure 02/09/2013  . Hypotension 02/09/2013  . Anemia 02/09/2013  . Hyperglycemia 02/09/2013  . Acute blood loss anemia 02/09/2013  . Leukocytosis 02/09/2013  . Lactic acidosis 02/09/2013  . Preop cardiovascular exam 01/23/2013  .  Mitral regurgitation 01/21/2013  . Recurrent right inguinal hernia 01/02/2013  . Left inguinal hernia 01/02/2013  . Impaired glucose tolerance   . Carotid artery disease   . Warfarin anticoagulation   . Ejection fraction   . Hx of CABG   . Aortic stenosis 08/25/2010  . DIZZINESS 06/19/2009  . AORTIC VALVE REPLACEMENT, HX OF 05/23/2009  . LEG EDEMA 11/27/2008  . AV BLOCK, COMPLETE 11/01/2008  . PACEMAKER, PERMANENT 11/01/2008  . DIVERTICULOSIS, COLON 10/17/2007  . GOUT 01/16/2007  . CAD (coronary artery disease) 01/16/2007  . HYPERLIPIDEMIA 01/16/2007  . HYPERTENSION 01/16/2007    PT and INR are slowly heading toward acceptable levels.  Continue observation.   * No  surgery found *    LOS: 9 days   Matt B. Daphine Deutscher, MD, [facility] [phone] beeper [phone]  02/18/2013 7:46 AM

## 2013-02-18 NOTE — Progress Notes (Signed)
Subjective:  A little tired. No CP, no SOB  Objective:  Vital Signs in the last 24 hours: Temp:  [97.3 F (36.3 C)-98.4 F (36.9 C)] 98.2 F (36.8 C) (08/10 0526) Pulse Rate:  [73-95] 95 (08/10 0526) Resp:  [18] 18 (08/10 0526) BP: (111-147)/(69-88) 115/78 mmHg (08/10 0526) SpO2:  [96 %-97 %] 96 % (08/10 0526)  Intake/Output from previous day: 08/09 0701 - 08/10 0700 In: 482.7 [I.V.:482.7] Out: 3900 [Urine:3900]   Physical Exam: General: Well developed, well nourished, in no acute distress. Head:  Normocephalic and atraumatic. Lungs: Clear to auscultation and percussion. Heart: Normal S1 and sharp S2.click  No murmur, rubs or gallops.  Abdomen: soft, non-tender, positive bowel sounds. Extremities: No clubbing or cyanosis. No edema. Neurologic: Alert and oriented x 3.    Lab Results:  Recent Labs  02/17/13 0432 02/18/13 0500  WBC 11.1* 11.5*  HGB 9.3* 10.3*  PLT 339 370    Recent Labs  02/16/13 0435  NA 136  K 3.7  CL 99  CO2 30  GLUCOSE 139*  BUN 17  CREATININE 0.74   Assessment/Plan:  Principal Problem:   Abdominal pain Active Problems:   Anuria   Acute renal failure   Hypotension   Anemia   Hyperglycemia   Acute blood loss anemia   Leukocytosis   Lactic acidosis  1 H/O AVR - Heparin has been resumed; HGB unchanged; continue coumadin; continue heparin until INR 2.5 (although with mechanical aortic valve would not be unreasonable to run at 2-3 INR.) Will resume ASA 81 mg daily in 2-4 weeks if Hgb remains stable. INR 1.14 now   2 extraperitoneal hematoma/acute blood loss anemia/hypovolemic shock - improved; no evidence of active bleeding at present; transfuse as needed; management per general surgery/CCM.   3 Acute kidney injury - most likely ATN from hypotension; resoloved.   4 HTN - BP controlled at present; continue toprol; continue lasix. Will not resume altace at present as BP borderline; can be added as outpatient if needed.   5 CAD/s/p  CABG   6 H/O pacemaker   7 HL - simvastatin    Shannette Tabares 02/18/2013, 8:03 AM

## 2013-02-18 NOTE — Progress Notes (Signed)
ANTICOAGULATION CONSULT NOTE - Follow Up  Pharmacy Consult for Heparin & Warfarin Indication: Mechanical Aortic Heart Valve  Allergies  Allergen Reactions  . Codeine Phosphate     REACTION: difficulty breathing  . Metoprolol Tartrate     REACTION: loss of vision   Patient Measurements: Height: 5\' 7"  (170.2 cm) Weight: 251 lb 15.8 oz (114.3 kg) IBW/kg (Calculated) : 66.1 Heparin Dosing Weight: 91.6kg  Vital Signs: Temp: 98.2 F (36.8 C) (08/10 0526) Temp src: Oral (08/10 0526) BP: 115/78 mmHg (08/10 0526) Pulse Rate: 95 (08/10 0526)  Labs:  Recent Labs  02/16/13 0435 02/17/13 0432 02/18/13 0500  HGB 8.8* 9.3* 10.3*  HCT 27.3* 29.0* 30.9*  PLT 293 339 370  LABPROT 14.0 13.9 14.4  INR 1.10 1.09 1.14  HEPARINUNFRC 0.60 0.37 0.59  CREATININE 0.74  --   --     Estimated Creatinine Clearance: 106.8 ml/min (by C-G formula based on Cr of 0.74).  Medications:  Scheduled:  . furosemide  40 mg Oral BID  . insulin aspart  0-9 Units Subcutaneous TID WC  . metoprolol succinate  50 mg Oral Daily  . omega-3 acid ethyl esters  1 g Oral BID  . pantoprazole  40 mg Oral Daily  . potassium chloride  20 mEq Oral Daily  . simvastatin  40 mg Oral q1800  . Warfarin - Pharmacist Dosing Inpatient   Does not apply q1800   Infusions:  . heparin 2,000 Units/hr (02/18/13 0159)    Assessment: 67 yo male admitted 8/1 with lower abdomen/groin hematoma s/p bilateral inguinal hernia repair 7/30. Patient on chronic coumadin for St. Jude AVR with mechanical valve and was on lovenox bridge prior to and following surgery, Warfarin resumed 7/30.  Bruising in scrotum improved, hematoma resolving  Heparin started without bolus 8/5  Heparin level therapeutic today  Warfarin resumed 8/7, INR subtherapeutic.  Doses thus far: 10mg , 10mg , 12.5mg   Home warfarin dosing = 10 mg daily except 5 mg on Mondays  Hgb has remained stable, no bleeding reported   Goal of Therapy:  INR 2-3 for  mechanical AVR; Continue Heparin until INR 2.5 per Cardiology Heparin level 0.3-0.7 units/ml Monitor platelets by anticoagulation protocol: Yes   Plan:   Continue heparin infusion at 2000 units/hr.  Warfarin 15 mg this morning at 10am.  INR has been slow to respond so giving boost dose today of 1.5x home dose.  Continue daily heparin level, CBC, INR  Discontinue Heparin when INR 2.5  Clance Boll, PharmD, BCPS Pager: [phone] 02/18/2013 7:22 AM

## 2013-02-19 DIAGNOSIS — I2510 Atherosclerotic heart disease of native coronary artery without angina pectoris: Secondary | ICD-10-CM

## 2013-02-19 LAB — GLUCOSE, CAPILLARY
Glucose-Capillary: 120 mg/dL — ABNORMAL HIGH (ref 70–99)
Glucose-Capillary: 137 mg/dL — ABNORMAL HIGH (ref 70–99)
Glucose-Capillary: 154 mg/dL — ABNORMAL HIGH (ref 70–99)

## 2013-02-19 LAB — PROTIME-INR: Prothrombin Time: 16.4 seconds — ABNORMAL HIGH (ref 11.6–15.2)

## 2013-02-19 LAB — CBC
MCH: 28.4 pg (ref 26.0–34.0)
MCV: 89.2 fL (ref 78.0–100.0)
Platelets: 346 10*3/uL (ref 150–400)
RDW: 15.3 % (ref 11.5–15.5)

## 2013-02-19 MED ORDER — WARFARIN SODIUM 7.5 MG PO TABS
15.0000 mg | ORAL_TABLET | Freq: Once | ORAL | Status: AC
  Administered 2013-02-19: 15 mg via ORAL
  Filled 2013-02-19: qty 2

## 2013-02-19 NOTE — Progress Notes (Signed)
  Subjective: No issues, no significant discomfort.  Tolerating diet  Objective: Vital signs in last 24 hours: Temp:  [97.7 F (36.5 C)-98.3 F (36.8 C)] 97.7 F (36.5 C) (08/11 0446) Pulse Rate:  [67-78] 67 (08/11 0446) Resp:  [18] 18 (08/11 0446) BP: (104-120)/(68-74) 120/74 mmHg (08/11 0446) SpO2:  [95 %-99 %] 95 % (08/11 0446) Last BM Date: 02/18/13  Intake/Output from previous day: 08/10 0701 - 08/11 0700 In: 600 [P.O.:120; I.V.:480] Out: 1950 [Urine:1950] Intake/Output this shift:    General appearance: alert, cooperative and no distress Resp: nonlabored GI: incisions okay, no sign of infection, ND, lower abdominal and pelvic ecchymosis  Lab Results:   Recent Labs  02/18/13 0500 02/19/13 0420  WBC 11.5* 10.2  HGB 10.3* 9.7*  HCT 30.9* 30.5*  PLT 370 346   BMET No results found for this basename: NA, K, CL, CO2, GLUCOSE, BUN, CREATININE, CALCIUM,  in the last 72 hours PT/INR  Recent Labs  02/18/13 0500 02/19/13 0420  LABPROT 14.4 16.4*  INR 1.14 1.36   ABG No results found for this basename: PHART, PCO2, PO2, HCO3,  in the last 72 hours  Studies/Results: No results found.  Anti-infectives: Anti-infectives   Start     Dose/Rate Route Frequency Ordered Stop   02/14/13 1000  azithromycin (ZITHROMAX) tablet 250 mg  Status:  Discontinued     250 mg Oral Daily 02/13/13 0619 02/15/13 0844   02/13/13 1000  azithromycin (ZITHROMAX) tablet 500 mg     500 mg Oral Daily 02/13/13 0619 02/13/13 0940   02/09/13 1400  ceFAZolin (ANCEF) IVPB 2 g/50 mL premix  Status:  Discontinued     2 g 100 mL/hr over 30 Minutes Intravenous 3 times per day 02/09/13 1142 02/15/13 0844      Assessment/Plan: s/p * No surgery found * Doing okay.  awaiting therapeutic INR.  LOS: 10 days    [name] DAVID 02/19/2013

## 2013-02-19 NOTE — Progress Notes (Signed)
ANTICOAGULATION CONSULT NOTE - Follow Up  Pharmacy Consult for Heparin & Warfarin Indication: Mechanical Aortic Heart Valve  Allergies  Allergen Reactions  . Codeine Phosphate     REACTION: difficulty breathing  . Metoprolol Tartrate     REACTION: loss of vision   Patient Measurements: Height: 5\' 7"  (170.2 cm) Weight: 251 lb 15.8 oz (114.3 kg) IBW/kg (Calculated) : 66.1 Heparin Dosing Weight: 91.6kg  Vital Signs: Temp: 97.7 F (36.5 C) (08/11 0446) Temp src: Oral (08/11 0446) BP: 116/74 mmHg (08/11 1006) Pulse Rate: 71 (08/11 1006)  Labs:  Recent Labs  02/17/13 0432 02/18/13 0500 02/19/13 0420 02/19/13 1245  HGB 9.3* 10.3* 9.7*  --   HCT 29.0* 30.9* 30.5*  --   PLT 339 370 346  --   LABPROT 13.9 14.4 16.4*  --   INR 1.09 1.14 1.36  --   HEPARINUNFRC 0.37 0.59 0.88* 0.48    Estimated Creatinine Clearance: 106.8 ml/min (by C-G formula based on Cr of 0.74).  Medications:  Scheduled:  . furosemide  40 mg Oral BID  . insulin aspart  0-9 Units Subcutaneous TID WC  . metoprolol succinate  50 mg Oral Daily  . omega-3 acid ethyl esters  1 g Oral BID  . pantoprazole  40 mg Oral Daily  . potassium chloride  20 mEq Oral Daily  . simvastatin  40 mg Oral q1800  . Warfarin - Pharmacist Dosing Inpatient   Does not apply q1800   Infusions:  . heparin 1,500 Units/hr (02/19/13 1610)    Assessment: 67 yo male admitted 8/1 with lower abdomen/groin hematoma s/p bilateral inguinal hernia repair 7/30. Patient on chronic warfarin for St. Jude AVR and was on Lovenox bridge prior to and following surgery, warfarin was resumed 7/30 postop.  Lovenox and warfarin were discontinued due to bleeding.  IV heparin was started 8/5 and warfarin was resumed 8/7.  Heparin level therapeutic after rate reduction to 1500 units/hr this AM.  INR slowly responding.  Inpatient doses thus far: 10mg , 10mg , 12.5mg , 15 mg.  Warfarin 15 mg was given this morning as outlined in earlier note.  Patient  confirms that his usual warfarin dosage is10 mg daily except 5 mg on Mondays.   Goal of Therapy:  INR 2-3 for mechanical AVR;  Heparin level 0.3-0.7 units/ml Monitor platelets by anticoagulation protocol: Yes   Plan:   Continue heparin at 1500 units/hr today.   Recheck heparin level, PT/INR, CBC in AM.  Per cardiology note from today, plan to discontinue heparin when INR is 2.0 or above.  Elie Goody, PharmD, BCPS Pager: [phone] 02/19/2013  1:52 PM

## 2013-02-19 NOTE — Progress Notes (Signed)
    SUBJECTIVE: No CP, No SOB  BP 120/74  Pulse 67  Temp(Src) 97.7 F (36.5 C) (Oral)  Resp 18  Ht 5\' 7"  (1.702 m)  Wt 251 lb 15.8 oz (114.3 kg)  BMI 39.46 kg/m2  SpO2 95%  Intake/Output Summary (Last 24 hours) at 02/19/13 0644 Last data filed at 02/19/13 0600  Gross per 24 hour  Intake    600 ml  Output   1950 ml  Net  -1350 ml    PHYSICAL EXAM General: Well developed, well nourished, in no acute distress. Alert and oriented x 3.  Psych:  Good affect, responds appropriately Neck: No JVD. No masses noted.  Lungs: Clear bilaterally with no wheezes or rhonci noted.  Heart: RRR with no murmurs noted. Abdomen: Bowel sounds are present. Soft, non-tender.  Extremities: No lower extremity edema.   LABS: CBC:  Recent Labs  02/18/13 0500 02/19/13 0420  WBC 11.5* 10.2  HGB 10.3* 9.7*  HCT 30.9* 30.5*  MCV 89.0 89.2  PLT 370 346    Current Meds: . furosemide  40 mg Oral BID  . insulin aspart  0-9 Units Subcutaneous TID WC  . metoprolol succinate  50 mg Oral Daily  . omega-3 acid ethyl esters  1 g Oral BID  . pantoprazole  40 mg Oral Daily  . potassium chloride  20 mEq Oral Daily  . simvastatin  40 mg Oral q1800  . warfarin  15 mg Oral Once  . Warfarin - Pharmacist Dosing Inpatient   Does not apply q1800   ASSESSMENT AND PLAN:  1 History of AVR: Heparin has been resumed; HGB stable. Continue coumadin and will continue heparin drip until INR 2.0. INR 1.36 today. Would not use Lovenox as bridge with recent bleeding  2 Extraperitoneal hematoma/acute blood loss anemia/hypovolemic shock: Improved; no evidence of active bleeding at present. management per general surgery/CCM.   3 Acute kidney injury: most likely ATN from hypotension; resoloved.   4 HTN: BP controlled. Will not resume altace at present as BP borderline  can be added as outpatient if needed.   5 CAD s/p CABG:  Stable.    MCALHANY,CHRISTOPHER  8/11/20146:44 AM

## 2013-02-19 NOTE — Progress Notes (Signed)
Patient ID: [name], male   DOB: 1946/07/08, 67 y.o.   MRN: [id number]   Patient resting comfortably.  Still has some groin/ scrotal tenderness No sign of recurrent hernia on either side Incisions c/d/i Bruising is improving  Awaiting therapeutic INR prior to discharge.  Wilmon Arms. Corliss Skains, MD, [facility]  General/ Trauma Surgery  02/19/2013 5:23 PM

## 2013-02-19 NOTE — Progress Notes (Signed)
ANTICOAGULATION CONSULT NOTE - Follow Up  Pharmacy Consult for Heparin & Warfarin Indication: Mechanical Aortic Heart Valve  Allergies  Allergen Reactions  . Codeine Phosphate     REACTION: difficulty breathing  . Metoprolol Tartrate     REACTION: loss of vision   Patient Measurements: Height: 5\' 7"  (170.2 cm) Weight: 251 lb 15.8 oz (114.3 kg) IBW/kg (Calculated) : 66.1 Heparin Dosing Weight: 91.6kg  Vital Signs: Temp: 97.7 F (36.5 C) (08/11 0446) Temp src: Oral (08/11 0446) BP: 120/74 mmHg (08/11 0446) Pulse Rate: 67 (08/11 0446)  Labs:  Recent Labs  02/17/13 0432 02/18/13 0500 02/19/13 0420  HGB 9.3* 10.3* 9.7*  HCT 29.0* 30.9* 30.5*  PLT 339 370 346  LABPROT 13.9 14.4 16.4*  INR 1.09 1.14 1.36  HEPARINUNFRC 0.37 0.59 0.88*    Estimated Creatinine Clearance: 106.8 ml/min (by C-G formula based on Cr of 0.74).  Medications:  Scheduled:  . furosemide  40 mg Oral BID  . insulin aspart  0-9 Units Subcutaneous TID WC  . metoprolol succinate  50 mg Oral Daily  . omega-3 acid ethyl esters  1 g Oral BID  . pantoprazole  40 mg Oral Daily  . potassium chloride  20 mEq Oral Daily  . simvastatin  40 mg Oral q1800  . Warfarin - Pharmacist Dosing Inpatient   Does not apply q1800   Infusions:  . heparin 2,000 Units/hr (02/19/13 0152)    Assessment: 67 yo male admitted 8/1 with lower abdomen/groin hematoma s/p bilateral inguinal hernia repair 7/30. Patient on chronic coumadin for St. Jude AVR with mechanical valve and was on lovenox bridge prior to and following surgery, Warfarin resumed 7/30.  Heparin level supratherapeutic this AM.   RN reports no new evidence of bleeding. Hgb, Hct, Pltc relatively stable.  Warfarin resumed 8/7, INR slowly responding.  Inpatient doses thus far: 10mg , 10mg , 12.5mg , 15 mg.  Home warfarin dosing reported as 10 mg daily except 5 mg on Mondays.   Goal of Therapy:  INR 2-3 for mechanical AVR; Continue Heparin until INR 2.5 per  Cardiology Heparin level 0.3-0.7 units/ml Monitor platelets by anticoagulation protocol: Yes   Plan:   Reduce heparin to 1500 units/hr now, recheck heparin level at 12:30 pm.  Repeat warfarin 15 mg this morning at 10am.  (INR has been slow to respond so giving another booster dose today of approximately 1.5x home dose).  Continue daily heparin level, CBC, INR  Elie Goody, PharmD, BCPS Pager: [phone] 02/19/2013  6:18 AM

## 2013-02-20 LAB — CBC
HCT: 31.3 % — ABNORMAL LOW (ref 39.0–52.0)
Hemoglobin: 10 g/dL — ABNORMAL LOW (ref 13.0–17.0)
RBC: 3.51 MIL/uL — ABNORMAL LOW (ref 4.22–5.81)
WBC: 10.5 10*3/uL (ref 4.0–10.5)

## 2013-02-20 LAB — GLUCOSE, CAPILLARY: Glucose-Capillary: 190 mg/dL — ABNORMAL HIGH (ref 70–99)

## 2013-02-20 LAB — HEPARIN LEVEL (UNFRACTIONATED): Heparin Unfractionated: 0.27 IU/mL — ABNORMAL LOW (ref 0.30–0.70)

## 2013-02-20 MED ORDER — HEPARIN (PORCINE) IN NACL 100-0.45 UNIT/ML-% IJ SOLN
1600.0000 [IU]/h | INTRAMUSCULAR | Status: DC
  Administered 2013-02-20 – 2013-02-23 (×5): 1600 [IU]/h via INTRAVENOUS
  Filled 2013-02-20 (×8): qty 250

## 2013-02-20 MED ORDER — WARFARIN SODIUM 7.5 MG PO TABS
17.5000 mg | ORAL_TABLET | Freq: Once | ORAL | Status: AC
  Administered 2013-02-20: 17.5 mg via ORAL
  Filled 2013-02-20: qty 1

## 2013-02-20 NOTE — Progress Notes (Signed)
    SUBJECTIVE: No complaints.   BP 110/66  Pulse 70  Temp(Src) 98.2 F (36.8 C) (Oral)  Resp 14  Ht 5\' 7"  (1.702 m)  Wt 251 lb 15.8 oz (114.3 kg)  BMI 39.46 kg/m2  SpO2 94%  Intake/Output Summary (Last 24 hours) at 02/20/13 1734 Last data filed at 02/20/13 1451  Gross per 24 hour  Intake 1025.5 ml  Output   4600 ml  Net -3574.5 ml    PHYSICAL EXAM General: Well developed, well nourished, in no acute distress. Alert and oriented x 3.  Psych:  Good affect, responds appropriately Neck: No JVD. No masses noted.  Lungs: Clear bilaterally with no wheezes or rhonci noted.  Heart: RRR with no murmurs noted. Abdomen: Bowel sounds are present. Soft, non-tender.  Extremities: No lower extremity edema.   LABS:CBC:  Recent Labs  02/19/13 0420 02/20/13 0420  WBC 10.2 10.5  HGB 9.7* 10.0*  HCT 30.5* 31.3*  MCV 89.2 89.2  PLT 346 338   Current Meds: . furosemide  40 mg Oral BID  . insulin aspart  0-9 Units Subcutaneous TID WC  . metoprolol succinate  50 mg Oral Daily  . omega-3 acid ethyl esters  1 g Oral BID  . pantoprazole  40 mg Oral Daily  . potassium chloride  20 mEq Oral Daily  . simvastatin  40 mg Oral q1800  . Warfarin - Pharmacist Dosing Inpatient   Does not apply q1800     ASSESSMENT AND PLAN:  1 History of AVR: Heparin has been resumed; HGB stable. Continue coumadin and will continue heparin drip until INR 2.0.    INR 1.42 today. Would not use Lovenox as bridge with recent bleeding   2 Extraperitoneal hematoma/acute blood loss anemia/hypovolemic shock: Improved; no evidence of active bleeding at present. management per general surgery/CCM.   3 HTN: BP controlled. Will not resume altace at present as BP borderline can be added as outpatient if needed.   4 CAD s/p CABG: Stable.     [name]  8/12/20145:34 PM

## 2013-02-20 NOTE — Progress Notes (Signed)
ANTICOAGULATION CONSULT NOTE - Follow Up  Pharmacy Consult for Heparin  Indication: Mechanical Aortic Heart Valve  Allergies  Allergen Reactions  . Codeine Phosphate     REACTION: difficulty breathing  . Metoprolol Tartrate     REACTION: loss of vision   Patient Measurements: Height: 5\' 7"  (170.2 cm) Weight: 251 lb 15.8 oz (114.3 kg) IBW/kg (Calculated) : 66.1 Heparin Dosing Weight: 91.6kg  Vital Signs: Temp: 98.2 F (36.8 C) (08/12 1450) Temp src: Oral (08/12 1450) BP: 110/66 mmHg (08/12 1450) Pulse Rate: 70 (08/12 1450)  Labs:  Recent Labs  02/18/13 0500 02/19/13 0420 02/19/13 1245 02/20/13 0420 02/20/13 1440  HGB 10.3* 9.7*  --  10.0*  --   HCT 30.9* 30.5*  --  31.3*  --   PLT 370 346  --  338  --   LABPROT 14.4 16.4*  --  17.0*  --   INR 1.14 1.36  --  1.42  --   HEPARINUNFRC 0.59 0.88* 0.48 0.27* 0.32   Medications:  Scheduled:  . furosemide  40 mg Oral BID  . insulin aspart  0-9 Units Subcutaneous TID WC  . metoprolol succinate  50 mg Oral Daily  . omega-3 acid ethyl esters  1 g Oral BID  . pantoprazole  40 mg Oral Daily  . potassium chloride  20 mEq Oral Daily  . simvastatin  40 mg Oral q1800  . Warfarin - Pharmacist Dosing Inpatient   Does not apply q1800   Infusions:  . heparin 1,600 Units/hr (02/20/13 1113)   Assessment: 67 yo male admitted 8/1 with lower abdomen/groin hematoma s/p bilateral inguinal hernia repair 7/30. Patient on chronic warfarin for St. Jude AVR and was on Lovenox bridge prior to and following surgery, warfarin was resumed 7/30 postop.  Lovenox and warfarin were discontinued due to bleeding.  IV heparin was started 8/5 and warfarin was resumed 8/7.  Heparin level therapeutic after increase in rate  No bleeding or other issues reported  Goal of Therapy:  INR 2-3 for mechanical AVR;  Heparin level 0.3-0.7 units/ml Monitor platelets by anticoagulation protocol: Yes   Plan:   Continue heparin to 1600 units/hr this am.    Recheck heparin level in AM  Daily PT/INR, CBC, Heparin level  Per cardiology note from 8/11, plan to discontinue heparin when INR is 2.0 or above.  Gwen Her PharmD  [phone] 02/20/2013 4:13 PM

## 2013-02-20 NOTE — Progress Notes (Signed)
  Subjective: No issues  Objective: Vital signs in last 24 hours: Temp:  [98.1 F (36.7 C)-98.3 F (36.8 C)] 98.3 F (36.8 C) (08/12 0558) Pulse Rate:  [69-71] 69 (08/12 0558) Resp:  [12-16] 12 (08/12 0558) BP: (112-117)/(70-74) 112/71 mmHg (08/12 0558) SpO2:  [94 %-96 %] 94 % (08/12 0558) Last BM Date: 02/18/13  Intake/Output from previous day: 08/11 0701 - 08/12 0700 In: 1261.2 [P.O.:900; I.V.:361.2] Out: 3100 [Urine:3100] Intake/Output this shift:    General appearance: alert, cooperative and no distress GI: minimal tenderness, wounds okay, stable ecchymosis  Lab Results:   Recent Labs  02/19/13 0420 02/20/13 0420  WBC 10.2 10.5  HGB 9.7* 10.0*  HCT 30.5* 31.3*  PLT 346 338   BMET No results found for this basename: NA, K, CL, CO2, GLUCOSE, BUN, CREATININE, CALCIUM,  in the last 72 hours PT/INR  Recent Labs  02/19/13 0420 02/20/13 0420  LABPROT 16.4* 17.0*  INR 1.36 1.42   ABG No results found for this basename: PHART, PCO2, PO2, HCO3,  in the last 72 hours  Studies/Results: No results found.  Anti-infectives: Anti-infectives   Start     Dose/Rate Route Frequency Ordered Stop   02/14/13 1000  azithromycin (ZITHROMAX) tablet 250 mg  Status:  Discontinued     250 mg Oral Daily 02/13/13 0619 02/15/13 0844   02/13/13 1000  azithromycin (ZITHROMAX) tablet 500 mg     500 mg Oral Daily 02/13/13 0619 02/13/13 0940   02/09/13 1400  ceFAZolin (ANCEF) IVPB 2 g/50 mL premix  Status:  Discontinued     2 g 100 mL/hr over 30 Minutes Intravenous 3 times per day 02/09/13 1142 02/15/13 0844      Assessment/Plan: s/p * No surgery found * awaiting therapeutic INR  LOS: 11 days    [name] DAVID 02/20/2013

## 2013-02-20 NOTE — Progress Notes (Signed)
ANTICOAGULATION CONSULT NOTE - Follow Up  Pharmacy Consult for Heparin & Warfarin Indication: Mechanical Aortic Heart Valve  Allergies  Allergen Reactions  . Codeine Phosphate     REACTION: difficulty breathing  . Metoprolol Tartrate     REACTION: loss of vision   Patient Measurements: Height: 5\' 7"  (170.2 cm) Weight: 251 lb 15.8 oz (114.3 kg) IBW/kg (Calculated) : 66.1 Heparin Dosing Weight: 91.6kg  Vital Signs: Temp: 98.3 F (36.8 C) (08/12 0558) Temp src: Oral (08/12 0558) BP: 112/71 mmHg (08/12 0558) Pulse Rate: 69 (08/12 0558)  Labs:  Recent Labs  02/18/13 0500 02/19/13 0420 02/19/13 1245 02/20/13 0420  HGB 10.3* 9.7*  --  10.0*  HCT 30.9* 30.5*  --  31.3*  PLT 370 346  --  338  LABPROT 14.4 16.4*  --  17.0*  INR 1.14 1.36  --  1.42  HEPARINUNFRC 0.59 0.88* 0.48 0.27*   Medications:  Scheduled:  . furosemide  40 mg Oral BID  . insulin aspart  0-9 Units Subcutaneous TID WC  . metoprolol succinate  50 mg Oral Daily  . omega-3 acid ethyl esters  1 g Oral BID  . pantoprazole  40 mg Oral Daily  . potassium chloride  20 mEq Oral Daily  . simvastatin  40 mg Oral q1800  . Warfarin - Pharmacist Dosing Inpatient   Does not apply q1800   Infusions:  . heparin 1,500 Units/hr (02/20/13 0600)   Assessment: 67 yo male admitted 8/1 with lower abdomen/groin hematoma s/p bilateral inguinal hernia repair 7/30. Patient on chronic warfarin for St. Jude AVR and was on Lovenox bridge prior to and following surgery, warfarin was resumed 7/30 postop.  Lovenox and warfarin were discontinued due to bleeding.  IV heparin was started 8/5 and warfarin was resumed 8/7.  Heparin level therapeutic after rate reduction to 1500 units/hr yesterday AM. Heparin level today below goal range (0.27 units/ml)  INR slowly responding = 1.42 today.  Inpatient doses thus far: 10mg , 10mg , 12.5mg , 15 mg, 15mg     Patient confirms that his usual warfarin dosage is 10 mg daily except 5 mg on  Mondays.   Goal of Therapy:  INR 2-3 for mechanical AVR;  Heparin level 0.3-0.7 units/ml Monitor platelets by anticoagulation protocol: Yes   Plan:   Increase heparin to 1600 units/hr this am.   Recheck heparin level at 1500  Warfarin 17.5mg  today, give dose at 1000  Daily PT/INR, CBC, Heparin level  Per cardiology note from 8/11, plan to discontinue heparin when INR is 2.0 or above.  Otho Bellows PharmD Pager [phone] 02/20/2013, 8:18 AM

## 2013-02-21 DIAGNOSIS — Z952 Presence of prosthetic heart valve: Secondary | ICD-10-CM

## 2013-02-21 LAB — GLUCOSE, CAPILLARY
Glucose-Capillary: 143 mg/dL — ABNORMAL HIGH (ref 70–99)
Glucose-Capillary: 132 mg/dL — ABNORMAL HIGH (ref 70–99)

## 2013-02-21 LAB — CBC
HCT: 31.8 % — ABNORMAL LOW (ref 39.0–52.0)
MCV: 88.8 fL (ref 78.0–100.0)
Platelets: 371 10*3/uL (ref 150–400)
RBC: 3.58 MIL/uL — ABNORMAL LOW (ref 4.22–5.81)
WBC: 9.3 10*3/uL (ref 4.0–10.5)

## 2013-02-21 LAB — HEPARIN LEVEL (UNFRACTIONATED): Heparin Unfractionated: 0.57 IU/mL (ref 0.30–0.70)

## 2013-02-21 MED ORDER — WARFARIN SODIUM 7.5 MG PO TABS
15.0000 mg | ORAL_TABLET | Freq: Once | ORAL | Status: AC
  Administered 2013-02-21: 15 mg via ORAL
  Filled 2013-02-21: qty 2

## 2013-02-21 NOTE — Progress Notes (Signed)
    SUBJECTIVE: No complaints.   BP 135/74  Pulse 78  Temp(Src) 97.9 F (36.6 C) (Oral)  Resp 16  Ht 5\' 7"  (1.702 m)  Wt 251 lb 15.8 oz (114.3 kg)  BMI 39.46 kg/m2  SpO2 92%  Intake/Output Summary (Last 24 hours) at 02/21/13 0655 Last data filed at 02/21/13 0514  Gross per 24 hour  Intake 1149.5 ml  Output   4400 ml  Net -3250.5 ml    PHYSICAL EXAM General: Well developed, well nourished, in no acute distress. Alert and oriented x 3.  Psych:  Good affect, responds appropriately Neck: No JVD. No masses noted.  Lungs: Clear bilaterally with no wheezes or rhonci noted.  Heart: RRR with no murmurs noted. Abdomen: Bowel sounds are present. Soft, non-tender.  Extremities: No lower extremity edema.   LABS: CBC:  Recent Labs  02/20/13 0420 02/21/13 0402  WBC 10.5 9.3  HGB 10.0* 10.3*  HCT 31.3* 31.8*  MCV 89.2 88.8  PLT 338 371   Current Meds: . furosemide  40 mg Oral BID  . insulin aspart  0-9 Units Subcutaneous TID WC  . metoprolol succinate  50 mg Oral Daily  . omega-3 acid ethyl esters  1 g Oral BID  . pantoprazole  40 mg Oral Daily  . potassium chloride  20 mEq Oral Daily  . simvastatin  40 mg Oral q1800  . Warfarin - Pharmacist Dosing Inpatient   Does not apply q1800     ASSESSMENT AND PLAN:  1 History of AVR: Heparin has been resumed; H/H stable. Continue coumadin and will continue heparin drip until INR 2.0. INR 1.67  today. Would not use Lovenox as bridge with recent bleeding.   2 Extraperitoneal hematoma/acute blood loss anemia/hypovolemic shock: Improved; no evidence of active bleeding at present. management per general surgery   3 HTN: BP controlled. Will not resume altace at present as BP borderline can be added as outpatient if needed.   4 CAD s/p CABG: Stable.     Angel Weedon  8/13/20146:55 AM

## 2013-02-21 NOTE — Progress Notes (Signed)
  Subjective: Pt feeling good, no discomfort, ambulating.  Tolerating diet.    Objective: Vital signs in last 24 hours: Temp:  [97.9 F (36.6 C)-98.4 F (36.9 C)] 97.9 F (36.6 C) (08/13 0514) Pulse Rate:  [70-78] 78 (08/13 0514) Resp:  [14-16] 16 (08/13 0514) BP: (110-135)/(66-75) 135/74 mmHg (08/13 0514) SpO2:  [92 %-94 %] 92 % (08/13 0514) Last BM Date: 02/18/13  Intake/Output from previous day: 08/12 0701 - 08/13 0700 In: 1357.5 [P.O.:960; I.V.:397.5] Out: 4400 [Urine:4400] Intake/Output this shift:    General appearance: alert, cooperative, appears stated age and no distress GI: soft, non-tender; bowel sounds normal; no masses,  no organomegaly Extremities: extremities normal, atraumatic, no cyanosis or edema.  Steri strips are intact.  Ecchymosis lower abdomen extending to left lateral abdomen/back.  Lab Results:   Recent Labs  02/20/13 0420 02/21/13 0402  WBC 10.5 9.3  HGB 10.0* 10.3*  HCT 31.3* 31.8*  PLT 338 371   Anti-infectives: Anti-infectives   Start     Dose/Rate Route Frequency Ordered Stop   02/14/13 1000  azithromycin (ZITHROMAX) tablet 250 mg  Status:  Discontinued     250 mg Oral Daily 02/13/13 0619 02/15/13 0844   02/13/13 1000  azithromycin (ZITHROMAX) tablet 500 mg     500 mg Oral Daily 02/13/13 0619 02/13/13 0940   02/09/13 1400  ceFAZolin (ANCEF) IVPB 2 g/50 mL premix  Status:  Discontinued     2 g 100 mL/hr over 30 Minutes Intravenous 3 times per day 02/09/13 1142 02/15/13 0844      Assessment/Plan: 67 year old male with a history of AVR, HTN, CAD s/p CABG.  Bilateral inguinal hernia repair(02/07/13)  Extraperitoneal hematoma lower anterior abdomen -stable hemoglobin and hematocrit, improved pain.   -awaiting therapeutic INR 1.67 today. -coumadin per pharmacy -appreciate cardiology consult -encouraged to ambulate -SCDs   LOS: 12 days    Bonner Puna [facility] ANP-BC Pager [phone]  02/21/2013 8:56 AM  No new issues.  INR creeping  up. Hopefully INR therapeutic soon.

## 2013-02-21 NOTE — Progress Notes (Signed)
ANTICOAGULATION CONSULT NOTE - Follow Up  Pharmacy Consult for Heparin & Warfarin Indication: Mechanical Aortic Heart Valve  Allergies  Allergen Reactions  . Codeine Phosphate     REACTION: difficulty breathing  . Metoprolol Tartrate     REACTION: loss of vision   Patient Measurements: Height: 5\' 7"  (170.2 cm) Weight: 251 lb 15.8 oz (114.3 kg) IBW/kg (Calculated) : 66.1 Heparin Dosing Weight: 91.6kg  Vital Signs: Temp: 97.9 F (36.6 C) (08/13 0514) Temp src: Oral (08/13 0514) BP: 135/74 mmHg (08/13 0514) Pulse Rate: 78 (08/13 0514)  Labs:  Recent Labs  02/19/13 0420  02/20/13 0420 02/20/13 1440 02/21/13 0402  HGB 9.7*  --  10.0*  --  10.3*  HCT 30.5*  --  31.3*  --  31.8*  PLT 346  --  338  --  371  LABPROT 16.4*  --  17.0*  --  19.2*  INR 1.36  --  1.42  --  1.67*  HEPARINUNFRC 0.88*  < > 0.27* 0.32 0.57  < > = values in this interval not displayed. Medications:  Scheduled:  . furosemide  40 mg Oral BID  . insulin aspart  0-9 Units Subcutaneous TID WC  . metoprolol succinate  50 mg Oral Daily  . omega-3 acid ethyl esters  1 g Oral BID  . pantoprazole  40 mg Oral Daily  . potassium chloride  20 mEq Oral Daily  . simvastatin  40 mg Oral q1800  . Warfarin - Pharmacist Dosing Inpatient   Does not apply q1800   Infusions:  . heparin 1,600 Units/hr (02/21/13 0325)   Assessment: 67 yo male admitted 8/1 with lower abdomen/groin hematoma s/p bilateral inguinal hernia repair 7/30. Patient on chronic warfarin for St. Jude AVR and was on Lovenox bridge prior to and following surgery, warfarin was resumed 7/30 postop.  Lovenox and warfarin were discontinued due to bleeding.  IV heparin was started 8/5 and warfarin was resumed 8/7.  Heparin level therapeutic on  1600 units/hr.  No bleeding reported.  Hgb, Hct, Pltc stable.  INR slowly responding (1.67 today).  Inpatient warfarin doses administered thus far: 10mg , 10mg , 12.5mg , 15 mg, 15 mg, 17.5 mg.    Patient confirms  that his usual warfarin dosage is 10 mg daily except 5 mg on Mondays.   Goal of Therapy:  INR 2-3 for mechanical AVR;  Heparin level 0.3-0.7 units/ml Monitor platelets by anticoagulation protocol: Yes   Plan:   Continue heparin at 1600 units/hr.   Warfarin 15 mg today, give dose at 1000  Daily PT/INR, CBC, Heparin level  Per cardiology, plan to discontinue heparin when INR is 2.0 or above.  Elie Goody, PharmD, BCPS Pager: [phone] 02/21/2013  7:08 AM

## 2013-02-22 LAB — HEPARIN LEVEL (UNFRACTIONATED): Heparin Unfractionated: 0.49 IU/mL (ref 0.30–0.70)

## 2013-02-22 LAB — PROTIME-INR
INR: 1.77 — ABNORMAL HIGH (ref 0.00–1.49)
Prothrombin Time: 20.1 seconds — ABNORMAL HIGH (ref 11.6–15.2)

## 2013-02-22 LAB — GLUCOSE, CAPILLARY: Glucose-Capillary: 114 mg/dL — ABNORMAL HIGH (ref 70–99)

## 2013-02-22 LAB — CBC
Platelets: 375 10*3/uL (ref 150–400)
RDW: 15.3 % (ref 11.5–15.5)
WBC: 8.9 10*3/uL (ref 4.0–10.5)

## 2013-02-22 MED ORDER — WARFARIN SODIUM 7.5 MG PO TABS
15.0000 mg | ORAL_TABLET | Freq: Once | ORAL | Status: AC
  Administered 2013-02-22: 15 mg via ORAL
  Filled 2013-02-22: qty 2

## 2013-02-22 MED ORDER — RAMIPRIL 10 MG PO CAPS
10.0000 mg | ORAL_CAPSULE | Freq: Every day | ORAL | Status: DC
  Administered 2013-02-22 – 2013-02-23 (×2): 10 mg via ORAL
  Filled 2013-02-22 (×3): qty 1

## 2013-02-22 NOTE — Progress Notes (Signed)
    SUBJECTIVE: No complaints.   BP 147/87  Pulse 67  Temp(Src) 98.4 F (36.9 C) (Oral)  Resp 16  Ht 5\' 7"  (1.702 m)  Wt 251 lb 15.8 oz (114.3 kg)  BMI 39.46 kg/m2  SpO2 98%  Intake/Output Summary (Last 24 hours) at 02/22/13 0615 Last data filed at 02/22/13 8657  Gross per 24 hour  Intake 1393.33 ml  Output   3900 ml  Net -2506.67 ml    PHYSICAL EXAM General: Well developed, well nourished, in no acute distress. Alert and oriented x 3.  Psych:  Good affect, responds appropriately Neck: No JVD. No masses noted.  Lungs: Clear bilaterally with no wheezes or rhonci noted.  Heart: RRR with no murmurs noted. Abdomen: Bowel sounds are present. Soft, non-tender.  Extremities: No lower extremity edema.   LABS: CBC:  Recent Labs  02/21/13 0402 02/22/13 0413  WBC 9.3 8.9  HGB 10.3* 10.3*  HCT 31.8* 32.4*  MCV 88.8 89.5  PLT 371 375   Current Meds: . furosemide  40 mg Oral BID  . insulin aspart  0-9 Units Subcutaneous TID WC  . metoprolol succinate  50 mg Oral Daily  . omega-3 acid ethyl esters  1 g Oral BID  . pantoprazole  40 mg Oral Daily  . potassium chloride  20 mEq Oral Daily  . simvastatin  40 mg Oral q1800  . Warfarin - Pharmacist Dosing Inpatient   Does not apply q1800     ASSESSMENT AND PLAN:   1 History of AVR: Continue coumadin and heparin drip until INR 2.0. INR 1.77 today. Would not use Lovenox as bridge with recent bleeding. H/H stabe. He is anxious to go home. If his INR is over 1.9 tomorrow, I think it would be safe to let him go home then. His coumadin is followed in our [facility].   2 Extraperitoneal hematoma/acute blood loss anemia/hypovolemic shock: Improved; no evidence of active bleeding at present. management per general surgery   3. HTN: BP elevated this am. Resume home dose of Altace 10 mg once daily today.   4 CAD s/p CABG: Stable.    Jillana Selph  8/14/20146:15 AM

## 2013-02-22 NOTE — Progress Notes (Signed)
  Subjective: Pt feeling down today, wants to go home. Denies abdominal pain, bm today.  Appetite is good.  Ambulating in the hallways.    Objective: Vital signs in last 24 hours: Temp:  [98.4 F (36.9 C)] 98.4 F (36.9 C) (08/14 0552) Pulse Rate:  [67] 67 (08/14 0552) Resp:  [16] 16 (08/14 0552) BP: (115-147)/(77-87) 147/87 mmHg (08/14 0552) SpO2:  [95 %-98 %] 98 % (08/14 0552) Last BM Date: 02/18/13  Intake/Output from previous day: 08/13 0701 - 08/14 0700 In: 1336 [P.O.:1080; I.V.:256] Out: 3900 [Urine:3900] Intake/Output this shift:   PE General appearance: alert, cooperative, appears stated age and no distress  GI: soft, non-tender; bowel sounds normal; no masses, no organomegaly  Extremities: extremities normal, atraumatic, no cyanosis or edema. Steri strips are intact. Ecchymosis lower abdomen extending to left lateral abdomen/back  Lab Results:   Recent Labs  02/21/13 0402 02/22/13 0413  WBC 9.3 8.9  HGB 10.3* 10.3*  HCT 31.8* 32.4*  PLT 371 375   BMET No results found for this basename: NA, K, CL, CO2, GLUCOSE, BUN, CREATININE, CALCIUM,  in the last 72 hours PT/INR  Recent Labs  02/21/13 0402 02/22/13 0413  LABPROT 19.2* 20.1*  INR 1.67* 1.77*   ABG No results found for this basename: PHART, PCO2, PO2, HCO3,  in the last 72 hours  Studies/Results: No results found.  Anti-infectives: Anti-infectives   Start     Dose/Rate Route Frequency Ordered Stop   02/14/13 1000  azithromycin (ZITHROMAX) tablet 250 mg  Status:  Discontinued     250 mg Oral Daily 02/13/13 0619 02/15/13 0844   02/13/13 1000  azithromycin (ZITHROMAX) tablet 500 mg     500 mg Oral Daily 02/13/13 0619 02/13/13 0940   02/09/13 1400  ceFAZolin (ANCEF) IVPB 2 g/50 mL premix  Status:  Discontinued     2 g 100 mL/hr over 30 Minutes Intravenous 3 times per day 02/09/13 1142 02/15/13 0844      Assessment/Plan: 67 year old male with a history of AVR, HTN, CAD s/p CABG. Bilateral  inguinal hernia repair(02/07/13)   Extraperitoneal hematoma lower anterior abdomen  -stable hemoglobin and hematocrit, improved pain.  -awaiting therapeutic INR 1.77 today.  -coumadin per pharmacy  -appreciate cardiology consult  -encouraged to ambulate  -SCDs  Dispo: home tomorrow if INR >1.9   LOS: 13 days   Teala Daffron, [facility] ANP-BC Pager [phone]  02/22/2013 10:00 AM

## 2013-02-22 NOTE — Progress Notes (Signed)
ANTICOAGULATION CONSULT NOTE - Follow Up  Pharmacy Consult for Heparin & Warfarin Indication: Mechanical Aortic Heart Valve  Allergies  Allergen Reactions  . Codeine Phosphate     REACTION: difficulty breathing  . Metoprolol Tartrate     REACTION: loss of vision   Patient Measurements: Height: 5\' 7"  (170.2 cm) Weight: 251 lb 15.8 oz (114.3 kg) IBW/kg (Calculated) : 66.1 Heparin Dosing Weight: 91.6kg  Vital Signs: Temp: 98.4 F (36.9 C) (08/14 0552) Temp src: Oral (08/14 0552) BP: 147/87 mmHg (08/14 0552) Pulse Rate: 67 (08/14 0552)  Labs:  Recent Labs  02/20/13 0420 02/20/13 1440 02/21/13 0402 02/22/13 0413  HGB 10.0*  --  10.3* 10.3*  HCT 31.3*  --  31.8* 32.4*  PLT 338  --  371 375  LABPROT 17.0*  --  19.2* 20.1*  INR 1.42  --  1.67* 1.77*  HEPARINUNFRC 0.27* 0.32 0.57 0.49   Medications:  Scheduled:  . furosemide  40 mg Oral BID  . insulin aspart  0-9 Units Subcutaneous TID WC  . metoprolol succinate  50 mg Oral Daily  . omega-3 acid ethyl esters  1 g Oral BID  . pantoprazole  40 mg Oral Daily  . potassium chloride  20 mEq Oral Daily  . ramipril  10 mg Oral Daily  . simvastatin  40 mg Oral q1800  . Warfarin - Pharmacist Dosing Inpatient   Does not apply q1800   Infusions:  . heparin 1,600 Units/hr (02/21/13 2117)   Assessment: 67 yo male admitted 8/1 with lower abdomen/groin hematoma s/p bilateral inguinal hernia repair 7/30. Patient on chronic warfarin for St. Jude AVR and was on Lovenox bridge prior to and following surgery, warfarin was resumed 7/30 postop.  Lovenox and warfarin were discontinued due to bleeding.  IV heparin was started 8/5 and warfarin was resumed 8/7.   8/14:   Heparin level remains therapeutic on 1600 units/hr.  No bleeding reported and no issues with infusion.  Hgb, Hct, Pltc stable.    INR continues to rise, today 1.77.  Inpatient warfarin doses administered thus far: 10mg , 10mg , 12.5mg , 15 mg, 15 mg, 17.5 mg, 15mg .     Patient confirms that his usual warfarin dosage is 10 mg daily except 5 mg on Mondays.  Noted cardiology plans for if INR > 1.9 tomorrow, may be ok for d/c with close f/u at [facility]  Goal of Therapy:  INR 2-3 for mechanical AVR;  Heparin level 0.3-0.7 units/ml Monitor platelets by anticoagulation protocol: Yes   Plan:   Continue heparin at 1600 units/hr.   Repeat coumadin 15mg  po x 1 at 1000.   Daily PT/INR, CBC, Heparin level  Haynes Hoehn, PharmD 02/22/2013 7:49 AM  Pager: [phone]

## 2013-02-23 LAB — CBC
HCT: 32.1 % — ABNORMAL LOW (ref 39.0–52.0)
Hemoglobin: 10.3 g/dL — ABNORMAL LOW (ref 13.0–17.0)
MCH: 28.5 pg (ref 26.0–34.0)
MCHC: 32.1 g/dL (ref 30.0–36.0)

## 2013-02-23 LAB — GLUCOSE, CAPILLARY: Glucose-Capillary: 107 mg/dL — ABNORMAL HIGH (ref 70–99)

## 2013-02-23 LAB — HEPARIN LEVEL (UNFRACTIONATED): Heparin Unfractionated: 0.53 IU/mL (ref 0.30–0.70)

## 2013-02-23 MED ORDER — ACETAMINOPHEN 325 MG PO TABS: 650.0000 mg | ORAL_TABLET | Freq: Four times a day (QID) | ORAL | Status: DC | PRN

## 2013-02-23 MED ORDER — WARFARIN SODIUM 7.5 MG PO TABS
15.0000 mg | ORAL_TABLET | Freq: Once | ORAL | Status: AC
  Administered 2013-02-23: 15 mg via ORAL
  Filled 2013-02-23: qty 2

## 2013-02-23 NOTE — Progress Notes (Signed)
ANTICOAGULATION CONSULT NOTE - Follow Up  Pharmacy Consult for Heparin & Warfarin Indication: Mechanical Aortic Heart Valve  Allergies  Allergen Reactions  . Codeine Phosphate     REACTION: difficulty breathing  . Metoprolol Tartrate     REACTION: loss of vision   Patient Measurements: Height: 5\' 7"  (170.2 cm) Weight: 251 lb 15.8 oz (114.3 kg) IBW/kg (Calculated) : 66.1 Heparin Dosing Weight: 91.6kg  Vital Signs: Temp: 98.2 F (36.8 C) (08/15 0539) Temp src: Oral (08/15 0539) BP: 97/58 mmHg (08/15 0539) Pulse Rate: 69 (08/15 0539)  Labs:  Recent Labs  02/21/13 0402 02/22/13 0413 02/23/13 0417  HGB 10.3* 10.3* 10.3*  HCT 31.8* 32.4* 32.1*  PLT 371 375 392  LABPROT 19.2* 20.1* 20.9*  INR 1.67* 1.77* 1.86*  HEPARINUNFRC 0.57 0.49 0.53   Medications:  Scheduled:  . furosemide  40 mg Oral BID  . insulin aspart  0-9 Units Subcutaneous TID WC  . metoprolol succinate  50 mg Oral Daily  . omega-3 acid ethyl esters  1 g Oral BID  . pantoprazole  40 mg Oral Daily  . potassium chloride  20 mEq Oral Daily  . ramipril  10 mg Oral Daily  . simvastatin  40 mg Oral q1800  . Warfarin - Pharmacist Dosing Inpatient   Does not apply q1800   Infusions:  . heparin 1,600 Units/hr (02/23/13 0142)   Assessment: 67 yo male admitted 8/1 with lower abdomen/groin hematoma s/p bilateral inguinal hernia repair 7/30. Patient on chronic warfarin for St. Jude AVR and was on Lovenox bridge prior to and following surgery, warfarin was resumed 7/30 postop.  Lovenox and warfarin were discontinued due to bleeding.  IV heparin was started 8/5 and warfarin was resumed 8/7.    8/15:   Heparin level remains therapeutic on 1600 units/hr.  No bleeding reported and no issues with infusion.  Hgb, Hct, Pltc stable.    INR continues to rise, today 1.86.  Inpatient warfarin doses administered thus far: 10mg , 10mg , 12.5mg , 15 mg, 15 mg, 17.5 mg, 15mg , 15 mg   Patient confirms that his usual warfarin  dosage is 10 mg daily except 5 mg on Mondays.  Noted cardiology states with mechanical AVR there is a lower risk of thrombosis so okay to go home today with INR 1.86.  Recommend repeating 15 mg today  and f/u with INR check this weekend if possible after discharge.   Goal of Therapy:  INR 2-3 for mechanical AVR;  Heparin level 0.3-0.7 units/ml Monitor platelets by anticoagulation protocol: Yes   Plan:   Continue heparin at 1600 units/hr.   Repeat coumadin 15mg  po x 1 at 1000.   Daily PT/INR, CBC, Heparin level  Americus Scheurich, Loma Messing PharmD Pager #: [phone] 8:30 AM 02/23/2013

## 2013-02-23 NOTE — Care Management Note (Signed)
    Page 1 of 2   02/23/2013     1:04:51 PM   CARE MANAGEMENT NOTE 02/23/2013  Patient:  [name], [name]   Account Number:  [id number]  Date Initiated:  02/12/2013  Documentation initiated by:  DAVIS,RHONDA  Subjective/Objective Assessment:   pt with infection and retroperitoneal hematoma versus abcess.     Action/Plan:   to med surg floor from [facility] on [id number] will be to go home   Anticipated DC Date:  02/17/2013   Anticipated DC Plan:  HOME/SELF CARE  In-house referral  NA      DC Planning Services  CM consult      PAC Choice  NA   Choice offered to / List presented to:  NA   DME arranged  NA      DME agency  NA     HH arranged  NA      HH agency  NA   Status of service:  Completed, signed off Medicare Important Message given?  NA - LOS <3 / Initial given by admissions (If response is "NO", the following Medicare IM given date fields will be blank) Date Medicare IM given:   Date Additional Medicare IM given:    Discharge Disposition:  HOME/SELF CARE  Per UR Regulation:  Reviewed for med. necessity/level of care/duration of stay  If discussed at Long Length of Stay Meetings, dates discussed:    Comments:  02/23/2013 Colleen Can BSN RN CCM [phone] Pt discharged to today. Plans to go to his home where spouse will be caregiver. He is ambulatory and will not reuire DME or HH services .   [id number] Earlene Plater RN, BSN, CCN: [id number] Case management. Chart reviewed for discharge planning and present needs. Discharge needs: none present at time of review. Next chart review due:  [id number]

## 2013-02-23 NOTE — Progress Notes (Signed)
    SUBJECTIVE: No complaints   BP 97/58  Pulse 69  Temp(Src) 98.2 F (36.8 C) (Oral)  Resp 16  Ht 5\' 7"  (1.702 m)  Wt 251 lb 15.8 oz (114.3 kg)  BMI 39.46 kg/m2  SpO2 97%  Intake/Output Summary (Last 24 hours) at 02/23/13 1610 Last data filed at 02/23/13 0142  Gross per 24 hour  Intake  427.2 ml  Output   1300 ml  Net -872.8 ml    PHYSICAL EXAM General: Well developed, well nourished, in no acute distress. Alert and oriented x 3.  Psych:  Good affect, responds appropriately Neck: No JVD. No masses noted.  Lungs: Clear bilaterally with no wheezes or rhonci noted.  Heart: RRR with no murmurs noted. Abdomen: Bowel sounds are present. Soft, non-tender.  Extremities: No lower extremity edema.   LABS: CBC:  Recent Labs  02/22/13 0413 02/23/13 0417  WBC 8.9 8.6  HGB 10.3* 10.3*  HCT 32.4* 32.1*  MCV 89.5 88.9  PLT 375 392    Current Meds: . furosemide  40 mg Oral BID  . insulin aspart  0-9 Units Subcutaneous TID WC  . metoprolol succinate  50 mg Oral Daily  . omega-3 acid ethyl esters  1 g Oral BID  . pantoprazole  40 mg Oral Daily  . potassium chloride  20 mEq Oral Daily  . ramipril  10 mg Oral Daily  . simvastatin  40 mg Oral q1800  . Warfarin - Pharmacist Dosing Inpatient   Does not apply q1800    ASSESSMENT AND PLAN:   1 History of AVR: INR is 1.86 today. With mechanical AVR, there is lower risk for thrombosis so I think it will be ok for himi to go home today. He can f/u in our coumadin clinic Monday for INR check.   2 Extraperitoneal hematoma/acute blood loss anemia/hypovolemic shock: Improved; no evidence of active bleeding at present. management per general surgery   3. HTN: BP controlled.    4 CAD s/p CABG: Stable.    [name]  8/15/20147:11 AM

## 2013-02-23 NOTE — Discharge Summary (Signed)
Physician Discharge Summary  Kylee Nardozzi Kuck ZOX:[id number] DOB: 1946/04/27 DOA: 02/09/2013  PCP: Rogelia Boga, MD  Consultation: cardiology and pharmacy for warfarin management   Admit date: 02/09/2013 Discharge date: 02/23/2013  Recommendations for Outpatient Follow-up:   Follow-up Information   Follow up with Wynona Luna., MD On 03/21/2013. (appointment time 9:10.  arrive by 8:50 please)    Specialty:  General Surgery   Contact information:   [address] [place] [address] [phone]       Follow up with Verne Carrow, MD. Call on 02/26/2013. (please call to schedule a INR check on monday please)    Specialty:  Cardiology   Contact information:   [address] [place] [address] [phone]      Discharge Diagnoses:  1. Extraperitoneal hematoma lower abdomen following inguinal hernia repair 2. Warfarin therapy for aortic valve replacement 3. Bilateral laparoscopic hernia repair 02/07/13 Dr. Corliss Skains 4. Hypertension 5. Coronary artery disease s/p CABG   Surgical Procedure: none this admission   Discharge Condition: stable Disposition: home  Diet recommendation: heart healthy   Filed Weights   02/09/13 0850 02/09/13 1402 02/12/13 0500  Weight: 219 lb (99.338 kg) 238 lb 1.6 oz (108 kg) 251 lb 15.8 oz (114.3 kg)    Filed Vitals:   02/23/13 1018  BP: 113/62  Pulse: 72  Temp:   Resp:       Hospital Course:  Mr. Wendorff presented to [facility] ED with abdominal pain and hypotension 2 days after bilateral inguinal hernia repairs.  He was found to have a large abdominal wall hematoma.  He had a decrease in hemoglobin, but was stable.  He also had an elevated WBC count.  He was given 2 units of FFP and 2 units of PRBCs due to hypotension.  He was monitored in the ICU and transferred to regular floor when medically more stable.  He was started on heparin per pharmacy and given an additional 1 unit of blood.  His coumadin was  restarted, cardiology wanted the patient fully anticoagulated prior to discharge.  His vital signs remained stable. H&H stabilized.  He was mobilized and diet was advanced as tolerated.  This morning his INR was 1.86, this was felt by cardiology to be adequate for discharge.  I checked with pharmacy regarding dose today, given 15mg  prior to discharge, 10mg  sat and sun which is his normal home dose.  He will follow up in coumadin clinic on Monday with his cardiologist.  We discussed warning signs that warrant immediate attention.  We discussed his restrictions.  He was encouraged to call sooner if questions or concerns arise otherwise he will see Dr. Corliss Skains in the clinic in 2 weeks.    PE  General appearance: alert, cooperative, appears stated age and no distress  GI: soft, non-tender; bowel sounds normal; no masses, no organomegaly  Extremities: extremities normal, atraumatic, no cyanosis or edema. Steri strips are intact. Ecchymosis lower abdomen extending to left lateral abdomen/back  Discharge Instructions   Future Appointments Provider Department Dept Phone   03/16/2013 9:10 AM Wilmon Arms. Corliss Skains, MD [facility], [place] [phone]   04/10/2013 8:00 AM Gordy Savers, MD [facility] [phone]       Medication List    ASK your doctor about these medications       aspirin 81 MG tablet  Take 81 mg by mouth daily.     BONINE 25 MG Chew  Generic drug:  Meclizine HCl  Chew 1 tablet by mouth daily.     calcium carbonate 600 MG Tabs tablet  Commonly known as:  OS-CAL  Take 600 mg by mouth 2 (two) times daily with a meal.     enoxaparin 100 MG/ML injection  Commonly known as:  LOVENOX  Inject 1 mL (100 mg total) into the skin every 12 (twelve) hours.     Fish Oil 1000 MG Caps  Take 1,000 mg by mouth every morning.     furosemide 40 MG tablet  Commonly known as:  LASIX  Take 40 mg by mouth 2 (two) times daily.     Glucosamine-Chondroitin 250-200 MG  Caps  Take 2 capsules by mouth daily.     loratadine 10 MG tablet  Commonly known as:  CLARITIN  Take 10 mg by mouth daily.     metoprolol succinate 100 MG 24 hr tablet  Commonly known as:  TOPROL-XL  Take 100 mg by mouth every morning. Take with or immediately following a meal.     multivitamin tablet  Take 1 tablet by mouth daily.     nitroGLYCERIN 0.4 MG SL tablet  Commonly known as:  NITROSTAT  Place 1 tablet (0.4 mg total) under the tongue every 5 (five) minutes as needed. For chest pain     potassium chloride 10 MEQ tablet  Commonly known as:  KLOR-CON M10  Take 1 tablet (10 mEq total) by mouth 2 (two) times daily.     pravastatin 40 MG tablet  Commonly known as:  PRAVACHOL  Take 40 mg by mouth every evening.     ramipril 10 MG capsule  Commonly known as:  ALTACE  Take 10 mg by mouth every morning.     tadalafil 10 MG tablet  Commonly known as:  CIALIS  Take 1 tablet (10 mg total) by mouth daily as needed.     Tart Cherry Advanced Caps  Take 1 capsule by mouth every evening.     traMADol 50 MG tablet  Commonly known as:  ULTRAM  Take 50 mg by mouth every 6 (six) hours as needed for pain.     vitamin B-12 1000 MCG tablet  Commonly known as:  CYANOCOBALAMIN  Take 1,000 mcg by mouth daily.     vitamin C 500 MG tablet  Commonly known as:  ASCORBIC ACID  Take 1,000 mg by mouth daily.     warfarin 10 MG tablet  Commonly known as:  COUMADIN  Take 5-10 mg by mouth every evening. Takes 5 mg on Monday and 10 mg all the other days           Follow-up Information   Follow up with Wynona Luna., MD On 03/21/2013. (appointment time 9:10.  arrive by 8:50 please)    Specialty:  General Surgery   Contact information:   [address] [place] [address] [phone]       Follow up with Verne Carrow, MD. Call on 02/26/2013. (please call to schedule a INR check on monday please)    Specialty:  Cardiology   Contact information:   [address] [place] [address] [phone]        The results of significant diagnostics from this hospitalization (including imaging, microbiology, ancillary and laboratory) are listed below for reference.    Significant Diagnostic Studies: Ct Abdomen Pelvis Wo Contrast  02/09/2013   *RADIOLOGY REPORT*  Clinical Data: Status post laparoscopic inguinal hernia repair 2 days ago.  Now with  low abdominal pain and urinary retention.  CT ABDOMEN AND PELVIS WITHOUT CONTRAST  Technique:  Multidetector CT imaging of the abdomen and pelvis was performed following the standard protocol without intravenous contrast.  Comparison: None.  Findings: Several calcified granulomas are seen within the liver parenchyma.  Calcified granuloma also noted in the spleen.  There is a trace amount of perihepatic and perisplenic ascites.  The stomach, duodenum, pancreas, the and adrenal glands are unremarkable.  The gallbladder is distended.  There is some layering sludge or stones in the lumen of the gallbladder.  Right kidney is unremarkable.  2.0 cm water density lesion in the left kidney is compatible with a cyst.  No abdominal aortic aneurysm.  No evidence for lymphadenopathy in the abdomen.  The patient has free gas in the abdomen and lower thorax.  Gas is seen in the intraperitoneal space as well as extraperitoneal compartments of the anterior abdominal wall.  Imaging through the pelvis reveals a large 16.9 x 9.6 x 12.4 cm heterogeneous complex fluid collection in the anterior pelvis, consistent with hematoma.  This hematoma is deep to the rectus musculature and given the mass effect on the bladder and surgical history, it is most likely in the preperitoneal space.  Some blood products are seen tracking into the inguinal regions bilaterally, right greater than left.  There is edema/hemorrhage tracking in the pelvic floor posteriorly to the presacral space in the the patient has some subcutaneous edema in the lower  anterior abdominal wall, compatible with recent surgery.  Foley catheter decompresses the urinary bladder.  Terminal ileum is normal. The appendix is not visualized, but there is no edema or inflammation in the region of the cecum.  Bone windows reveal no worrisome lytic or sclerotic osseous lesions.  IMPRESSION: Large extraperitoneal hematoma deep to the rectus musculature in the lower anterior abdominal wall.  Intraperitoneal and extraperitoneal gas in the abdomen and pelvis tracks up into the lower chest consistent with dissection of gas from the history of laparoscopic surgery 2 days ago.   Original Report Authenticated By: Kennith Center, M.D.   Dg Chest 2 View  01/31/2013   *RADIOLOGY REPORT*  Clinical Data: Preoperative evaluation for hernia repair, history asthma, hypertension, smoking, post CABG, AVR, pacemaker  CHEST - 2 VIEW  Comparison: 11/10/2009  Findings: Right subclavian transvenous pacemaker leads project at right atrium and right ventricle. Upper normal heart size post CABG and AVR. Tortuous aorta. Pulmonary vascularity normal. Lungs clear. No pleural effusion or pneumothorax. Question prior left rotator cuff surgery. Prior resection versus resorption of distal right clavicle. Minimal thoracolumbar scoliosis.  IMPRESSION: Post pacemaker, CABG and AVR. No acute abnormalities.   Original Report Authenticated By: Ulyses Southward, M.D.   Dg Chest Port 1 View  02/09/2013   *RADIOLOGY REPORT*  Clinical Data: Postop hernia surgery  PORTABLE CHEST - 1 VIEW  Comparison: 01/31/2013  Findings: Dual lead right subclavian pacemaker device and leads are stable.  Normal heart size.  Lungs under aerated and clear.  No pneumothorax.  No pleural fluid.  IMPRESSION: No active cardiopulmonary disease.   Original Report Authenticated By: Jolaine Click, M.D.   CBC:  Recent Labs Lab 02/19/13 1610 02/20/13 0420 02/21/13 0402 02/22/13 0413 02/23/13 0417  WBC 10.2 10.5 9.3 8.9 8.6  HGB 9.7* 10.0* 10.3* 10.3* 10.3*   HCT 30.5* 31.3* 31.8* 32.4* 32.1*  MCV 89.2 89.2 88.8 89.5 88.9  PLT 346 338 371 375 392   CBG:  Recent Labs Lab 02/21/13 2225 02/22/13 0716 02/22/13  1700 02/22/13 2113 02/23/13 0727  GLUCAP 132* 114* 154* 146* 107*    Principal Problem:   Abdominal pain Active Problems:   Anuria   Acute renal failure   Hypotension   Anemia   Hyperglycemia   Acute blood loss anemia   Leukocytosis   Lactic acidosis   Aortic valve replaced   Signed:  Emir Nack, ANP-BC

## 2013-02-26 ENCOUNTER — Ambulatory Visit (INDEPENDENT_AMBULATORY_CARE_PROVIDER_SITE_OTHER): Payer: Medicare Other | Admitting: *Deleted

## 2013-02-26 DIAGNOSIS — Z954 Presence of other heart-valve replacement: Secondary | ICD-10-CM

## 2013-02-26 DIAGNOSIS — I359 Nonrheumatic aortic valve disorder, unspecified: Secondary | ICD-10-CM

## 2013-02-26 DIAGNOSIS — Z7901 Long term (current) use of anticoagulants: Secondary | ICD-10-CM

## 2013-02-26 LAB — POCT INR: INR: 2.2

## 2013-03-05 ENCOUNTER — Encounter (INDEPENDENT_AMBULATORY_CARE_PROVIDER_SITE_OTHER): Payer: Medicare Other | Admitting: Surgery

## 2013-03-07 ENCOUNTER — Ambulatory Visit (INDEPENDENT_AMBULATORY_CARE_PROVIDER_SITE_OTHER): Payer: Medicare Other | Admitting: Internal Medicine

## 2013-03-07 ENCOUNTER — Ambulatory Visit (INDEPENDENT_AMBULATORY_CARE_PROVIDER_SITE_OTHER): Payer: Medicare Other | Admitting: *Deleted

## 2013-03-07 ENCOUNTER — Encounter: Payer: Self-pay | Admitting: Internal Medicine

## 2013-03-07 VITALS — BP 120/80 | HR 75 | Temp 97.6°F | Resp 20 | Wt 227.0 lb

## 2013-03-07 DIAGNOSIS — Z7901 Long term (current) use of anticoagulants: Secondary | ICD-10-CM

## 2013-03-07 DIAGNOSIS — R300 Dysuria: Secondary | ICD-10-CM

## 2013-03-07 DIAGNOSIS — I359 Nonrheumatic aortic valve disorder, unspecified: Secondary | ICD-10-CM

## 2013-03-07 DIAGNOSIS — Z954 Presence of other heart-valve replacement: Secondary | ICD-10-CM

## 2013-03-07 DIAGNOSIS — I10 Essential (primary) hypertension: Secondary | ICD-10-CM

## 2013-03-07 LAB — POCT URINALYSIS DIPSTICK
Spec Grav, UA: 1.01
Urobilinogen, UA: 0.2
pH, UA: 6

## 2013-03-07 MED ORDER — CIPROFLOXACIN HCL 500 MG PO TABS: 500.0000 mg | ORAL_TABLET | Freq: Two times a day (BID) | ORAL | Status: DC

## 2013-03-07 NOTE — Progress Notes (Signed)
Subjective:    Patient ID: [name], male    DOB: June 05, 1946, 67 y.o.   MRN: [id number]  HPI  67 year old patient who is seen today in followup. He was discharged from the hospital on August 15 following surgery complicated by postoperative bleeding. He was hospitalized for 2 weeks and now is back on Coumadin anticoagulation. He generally has done quite well except for burning dysuria since his hospital discharge. He did have a Foley catheter performed due to hypovolemia. No discharge. Urinalysis was fairly unremarkable  Past Medical History  Diagnosis Date  . Aortic stenosis 08/25/2010    a. [place] aortic root replacement with a St. Jude mechanical valve and Hemashield conduit 02/2004.  . AV BLOCK, COMPLETE     a. s/p St Jude dual chamber pacemaker 02/2004.  Marland Kitchen CAD (coronary artery disease)     a. s/p CABGx2 (LIMA-dLAD, SVG-Cx). b. Low risk nuc 12/2012 without ischemia, EF 46% mild apical hypokinesia (EF 55% inf HK by echo).  . DIVERTICULOSIS, COLON 10/17/2007  . GOUT 01/16/2007  . HEMORRHOIDS, INTERNAL 11/01/2008  . HYPERLIPIDEMIA 01/16/2007  . HYPERTENSION 01/16/2007  . LEG EDEMA 11/27/2008  . Impaired glucose tolerance   . Carotid artery disease     a. 0-39% bilateral ICA stenosis, stable mild hard plaque in carotid bulbs. F/u 03/2014 recommended.  Marland Kitchen Special screening for malignant neoplasm of prostate   . Warfarin anticoagulation     AVR  . Ejection fraction     a. EF 55% with inf HK, mild MR by echo 12/2012.  . Asthma   . Arthritis   . GERD (gastroesophageal reflux disease)     hxof     History   Social History  . Marital Status: Married    Spouse Name: N/A    Number of Children: N/A  . Years of Education: N/A   Occupational History  . Not on file.   Social History Main Topics  . Smoking status: Former Smoker    Quit date: 07/12/1973  . Smokeless tobacco: Never Used  . Alcohol Use: No  . Drug Use: No  . Sexual Activity: No   Other Topics Concern  . Not on file    Social History Narrative  . No narrative on file    Past Surgical History  Procedure Laterality Date  . Coronary artery bypass graft    . Aortic valve replacement    . Pacemaker insertion    . Shoulder surgery    . Cardiac catheterization  06/2003  . Circumcision    . Colonoscopy    . Hernia repair    . Coronary stents       prior to bypass   . Inguinal hernia repair Bilateral 02/07/2013    Procedure: LAPAROSCOPIC BILATERAL INGUINAL HERNIA REPAIR;  Surgeon: Wilmon Arms. Corliss Skains, MD;  Location: [facility];  Service: General;  Laterality: Bilateral;  . Insertion of mesh Bilateral 02/07/2013    Procedure: INSERTION OF MESH;  Surgeon: Wilmon Arms. Corliss Skains, MD;  Location: [facility];  Service: General;  Laterality: Bilateral;    Family History  Problem Relation Age of Onset  . Heart attack Father   . Cancer Father     renal cancer  . Diabetes Brother   . Hypertension Mother   . Diabetes Mother     Allergies  Allergen Reactions  . Codeine Phosphate     REACTION: difficulty breathing  . Metoprolol Tartrate     REACTION: loss of vision    Current Outpatient Prescriptions  on File Prior to Visit  Medication Sig Dispense Refill  . acetaminophen (TYLENOL) 325 MG tablet Take 2 tablets (650 mg total) by mouth every 6 (six) hours as needed.      . Ascorbic Acid (VITAMIN C) 500 MG tablet Take 1,000 mg by mouth daily.       Marland Kitchen aspirin 81 MG tablet Take 81 mg by mouth daily.        . calcium carbonate (OS-CAL) 600 MG TABS Take 600 mg by mouth 2 (two) times daily with a meal.      . furosemide (LASIX) 40 MG tablet Take 40 mg by mouth 2 (two) times daily.      . Glucosamine-Chondroitin 250-200 MG CAPS Take 2 capsules by mouth daily.       Marland Kitchen loratadine (CLARITIN) 10 MG tablet Take 10 mg by mouth daily.      . Meclizine HCl (BONINE) 25 MG CHEW Chew 1 tablet by mouth daily.        . metoprolol succinate (TOPROL-XL) 100 MG 24 hr tablet Take 100 mg by mouth every morning. Take with or immediately following  a meal.      . Misc Natural Products (TART CHERRY ADVANCED) CAPS Take 1 capsule by mouth every evening.      . Multiple Vitamin (MULTIVITAMIN) tablet Take 1 tablet by mouth daily.        . nitroGLYCERIN (NITROSTAT) 0.4 MG SL tablet Place 1 tablet (0.4 mg total) under the tongue every 5 (five) minutes as needed. For chest pain  30 tablet  1  . Omega-3 Fatty Acids (FISH OIL) 1000 MG CAPS Take 1,000 mg by mouth every morning.       . potassium chloride (KLOR-CON M10) 10 MEQ tablet Take 1 tablet (10 mEq total) by mouth 2 (two) times daily.  180 tablet  3  . pravastatin (PRAVACHOL) 40 MG tablet Take 40 mg by mouth every evening.       . ramipril (ALTACE) 10 MG capsule Take 10 mg by mouth every morning.      . tadalafil (CIALIS) 10 MG tablet Take 1 tablet (10 mg total) by mouth daily as needed.  10 tablet  6  . traMADol (ULTRAM) 50 MG tablet Take 50 mg by mouth every 6 (six) hours as needed for pain.       . vitamin B-12 (CYANOCOBALAMIN) 1000 MCG tablet Take 1,000 mcg by mouth daily.      Marland Kitchen warfarin (COUMADIN) 10 MG tablet Take 5-10 mg by mouth every evening. Takes 5 mg on Monday and 10 mg all the other days      . [DISCONTINUED] potassium chloride (KLOR-CON 10) 10 MEQ CR tablet Take 1 tablet (10 mEq total) by mouth 2 (two) times daily.  180 tablet  6   No current facility-administered medications on file prior to visit.    BP 120/80  Pulse 75  Temp(Src) 97.6 F (36.4 C) (Oral)  Resp 20  Wt 227 lb (102.967 kg)  BMI 35.55 kg/m2  SpO2 97%       Review of Systems  Constitutional: Negative for fever, chills, appetite change and fatigue.  HENT: Negative for hearing loss, ear pain, congestion, sore throat, trouble swallowing, neck stiffness, dental problem, voice change and tinnitus.   Eyes: Negative for pain, discharge and visual disturbance.  Respiratory: Negative for cough, chest tightness, wheezing and stridor.   Cardiovascular: Negative for chest pain, palpitations and leg swelling.   Gastrointestinal: Negative for nausea, vomiting, abdominal pain,  diarrhea, constipation, blood in stool and abdominal distention.  Genitourinary: Positive for dysuria. Negative for urgency, hematuria, flank pain, discharge, difficulty urinating and genital sores.  Musculoskeletal: Negative for myalgias, back pain, joint swelling, arthralgias and gait problem.  Skin: Negative for rash.  Neurological: Negative for dizziness, syncope, speech difficulty, weakness, numbness and headaches.  Hematological: Negative for adenopathy. Does not bruise/bleed easily.  Psychiatric/Behavioral: Negative for behavioral problems and dysphoric mood. The patient is not nervous/anxious.        Objective:   Physical Exam  Constitutional: He is oriented to person, place, and time. He appears well-developed.  Blood pressure normal No tachycardia  HENT:  Head: Normocephalic.  Right Ear: External ear normal.  Left Ear: External ear normal.  Eyes: Conjunctivae and EOM are normal.  Neck: Normal range of motion.  Cardiovascular: Normal rate and normal heart sounds.   Pulmonary/Chest: Breath sounds normal.  Abdominal: Bowel sounds are normal.  Musculoskeletal: Normal range of motion. He exhibits no edema and no tenderness.  Neurological: He is alert and oriented to person, place, and time.  Skin:  Extensive resolving ecchymosis  Psychiatric: He has a normal mood and affect. His behavior is normal.          Assessment & Plan:   Dysuria. This has been persistent following Foley catheterization. Will treat with short course of Cipro Hypertension stable  chronic Coumadin anticoagulation following aVR

## 2013-03-07 NOTE — Patient Instructions (Addendum)
Limit your sodium (Salt) intake  Return in 6 months for follow-up   Take your antibiotic as prescribed until ALL of it is gone, but stop if you develop a rash, swelling, or any side effects of the medication.  Contact our office as soon as possible if  there are side effects of the medication.

## 2013-03-16 ENCOUNTER — Ambulatory Visit (INDEPENDENT_AMBULATORY_CARE_PROVIDER_SITE_OTHER): Payer: Medicare Other | Admitting: Surgery

## 2013-03-16 ENCOUNTER — Encounter (INDEPENDENT_AMBULATORY_CARE_PROVIDER_SITE_OTHER): Payer: Self-pay | Admitting: Surgery

## 2013-03-16 VITALS — BP 132/82 | HR 60 | Temp 98.0°F | Resp 18 | Ht 67.0 in | Wt 231.0 lb

## 2013-03-16 DIAGNOSIS — K4091 Unilateral inguinal hernia, without obstruction or gangrene, recurrent: Secondary | ICD-10-CM

## 2013-03-16 DIAGNOSIS — K4090 Unilateral inguinal hernia, without obstruction or gangrene, not specified as recurrent: Secondary | ICD-10-CM

## 2013-03-16 NOTE — Progress Notes (Signed)
Status post laparoscopic bilateral inguinal hernia repairs on 02/07/13. The surgery was performed through a Lovenox window. Unfortunately, the patient had significant postoperative bleeding in his pre-peritoneal space. He required hospitalization for almost a week as well as a blood transfusions. The hematoma seems to be resolving although he still has some residual bruising around his flanks. The swelling in both groins seems to be improving. He still has some burning and irritation with urination but this seems to be from Foley trauma. Urinary tract infection has been ruled out by his primary care physician. Appetite and bowel movements are normal.  His incisions are well-healed with no sign of infection. No sign of recurrent hernia on either side. He still has some residual swelling mostly on the right. He has some very slight remaining ecchymosis around his flank and buttocks but his wife states that this continues to improve. No sign of any irritation at the urethral meatus.  The patient may resume a normal level of activity over the next couple of weeks. Followup as needed.  Wilmon Arms. Corliss Skains, MD, [facility]  General/ Trauma Surgery  03/16/2013 10:06 AM

## 2013-03-21 ENCOUNTER — Ambulatory Visit (INDEPENDENT_AMBULATORY_CARE_PROVIDER_SITE_OTHER): Payer: Medicare Other | Admitting: *Deleted

## 2013-03-21 DIAGNOSIS — I359 Nonrheumatic aortic valve disorder, unspecified: Secondary | ICD-10-CM

## 2013-03-21 DIAGNOSIS — Z7901 Long term (current) use of anticoagulants: Secondary | ICD-10-CM

## 2013-03-21 DIAGNOSIS — Z954 Presence of other heart-valve replacement: Secondary | ICD-10-CM

## 2013-04-02 ENCOUNTER — Ambulatory Visit (INDEPENDENT_AMBULATORY_CARE_PROVIDER_SITE_OTHER): Payer: Medicare Other | Admitting: *Deleted

## 2013-04-02 DIAGNOSIS — Z23 Encounter for immunization: Secondary | ICD-10-CM

## 2013-04-04 ENCOUNTER — Ambulatory Visit (INDEPENDENT_AMBULATORY_CARE_PROVIDER_SITE_OTHER): Payer: Medicare Other | Admitting: *Deleted

## 2013-04-04 DIAGNOSIS — Z954 Presence of other heart-valve replacement: Secondary | ICD-10-CM

## 2013-04-04 DIAGNOSIS — Z7901 Long term (current) use of anticoagulants: Secondary | ICD-10-CM

## 2013-04-04 DIAGNOSIS — I359 Nonrheumatic aortic valve disorder, unspecified: Secondary | ICD-10-CM

## 2013-04-10 ENCOUNTER — Ambulatory Visit: Payer: Medicare Other | Admitting: Internal Medicine

## 2013-04-18 ENCOUNTER — Other Ambulatory Visit: Payer: Self-pay | Admitting: *Deleted

## 2013-04-18 MED ORDER — PRAVASTATIN SODIUM 40 MG PO TABS: 40.0000 mg | ORAL_TABLET | Freq: Every evening | ORAL | Status: DC

## 2013-05-02 ENCOUNTER — Ambulatory Visit (INDEPENDENT_AMBULATORY_CARE_PROVIDER_SITE_OTHER): Payer: Medicare Other | Admitting: *Deleted

## 2013-05-02 DIAGNOSIS — Z954 Presence of other heart-valve replacement: Secondary | ICD-10-CM

## 2013-05-02 DIAGNOSIS — Z7901 Long term (current) use of anticoagulants: Secondary | ICD-10-CM

## 2013-05-02 DIAGNOSIS — I359 Nonrheumatic aortic valve disorder, unspecified: Secondary | ICD-10-CM

## 2013-05-07 DIAGNOSIS — I442 Atrioventricular block, complete: Secondary | ICD-10-CM

## 2013-05-19 ENCOUNTER — Other Ambulatory Visit: Payer: Self-pay | Admitting: Internal Medicine

## 2013-05-23 ENCOUNTER — Encounter: Payer: Self-pay | Admitting: Internal Medicine

## 2013-05-30 ENCOUNTER — Ambulatory Visit (INDEPENDENT_AMBULATORY_CARE_PROVIDER_SITE_OTHER): Payer: Medicare Other | Admitting: *Deleted

## 2013-05-30 DIAGNOSIS — I359 Nonrheumatic aortic valve disorder, unspecified: Secondary | ICD-10-CM

## 2013-05-30 DIAGNOSIS — Z7901 Long term (current) use of anticoagulants: Secondary | ICD-10-CM

## 2013-05-30 DIAGNOSIS — Z954 Presence of other heart-valve replacement: Secondary | ICD-10-CM

## 2013-05-30 LAB — POCT INR: INR: 2.1

## 2013-06-27 ENCOUNTER — Other Ambulatory Visit: Payer: Self-pay | Admitting: Internal Medicine

## 2013-06-27 ENCOUNTER — Ambulatory Visit (INDEPENDENT_AMBULATORY_CARE_PROVIDER_SITE_OTHER): Payer: Medicare Other | Admitting: *Deleted

## 2013-06-27 DIAGNOSIS — I359 Nonrheumatic aortic valve disorder, unspecified: Secondary | ICD-10-CM

## 2013-06-27 DIAGNOSIS — Z7901 Long term (current) use of anticoagulants: Secondary | ICD-10-CM

## 2013-06-27 DIAGNOSIS — Z954 Presence of other heart-valve replacement: Secondary | ICD-10-CM

## 2013-06-27 LAB — POCT INR: INR: 2.6

## 2013-07-18 ENCOUNTER — Telehealth: Payer: Self-pay | Admitting: Internal Medicine

## 2013-07-18 MED ORDER — NITROGLYCERIN 0.4 MG SL SUBL: 0.4000 mg | SUBLINGUAL_TABLET | SUBLINGUAL | Status: DC | PRN

## 2013-07-18 MED ORDER — METOPROLOL SUCCINATE ER 100 MG PO TB24: 100.0000 mg | ORAL_TABLET | Freq: Every morning | ORAL | Status: DC

## 2013-07-18 MED ORDER — WARFARIN SODIUM 10 MG PO TABS: ORAL_TABLET | ORAL | Status: DC

## 2013-07-18 MED ORDER — POTASSIUM CHLORIDE CRYS ER 10 MEQ PO TBCR: 10.0000 meq | EXTENDED_RELEASE_TABLET | Freq: Two times a day (BID) | ORAL | Status: DC

## 2013-07-18 MED ORDER — FUROSEMIDE 40 MG PO TABS: 40.0000 mg | ORAL_TABLET | Freq: Two times a day (BID) | ORAL | Status: DC

## 2013-07-18 MED ORDER — RAMIPRIL 10 MG PO CAPS: 10.0000 mg | ORAL_CAPSULE | Freq: Every day | ORAL | Status: DC

## 2013-07-18 NOTE — Telephone Encounter (Signed)
[facility] requesting refill of ramipril (ALTACE) 10 MG capsule, warfarin (COUMADIN) 10 MG tablet, and furosemide (LASIX) 40 MG tablet

## 2013-07-18 NOTE — Telephone Encounter (Signed)
Rx's sent to [facility] 

## 2013-07-18 NOTE — Telephone Encounter (Signed)
[facility] requesting refill of the following medications:  warfarin (COUMADIN) 10 MG tablet ramipril (ALTACE) 10 MG capsule furosemide (LASIX) 40 MG tablet potassium chloride (KLOR-CON M10) 10 MEQ tablet metoprolol succinate (TOPROL-XL) 100 MG 24 hr tablet pravastatin (PRAVACHOL) 40 MG tablet nitroGLYCERIN (NITROSTAT) 0.4 MG SL tablet

## 2013-08-06 ENCOUNTER — Encounter: Payer: Self-pay | Admitting: Internal Medicine

## 2013-08-06 DIAGNOSIS — I442 Atrioventricular block, complete: Secondary | ICD-10-CM

## 2013-08-08 ENCOUNTER — Ambulatory Visit (INDEPENDENT_AMBULATORY_CARE_PROVIDER_SITE_OTHER): Payer: Medicare HMO | Admitting: *Deleted

## 2013-08-08 DIAGNOSIS — I359 Nonrheumatic aortic valve disorder, unspecified: Secondary | ICD-10-CM

## 2013-08-08 DIAGNOSIS — Z5181 Encounter for therapeutic drug level monitoring: Secondary | ICD-10-CM

## 2013-08-08 DIAGNOSIS — Z954 Presence of other heart-valve replacement: Secondary | ICD-10-CM

## 2013-08-08 DIAGNOSIS — Z7901 Long term (current) use of anticoagulants: Secondary | ICD-10-CM

## 2013-08-08 LAB — POCT INR: INR: 2.2

## 2013-08-23 ENCOUNTER — Encounter: Payer: Self-pay | Admitting: Internal Medicine

## 2013-08-23 ENCOUNTER — Ambulatory Visit (INDEPENDENT_AMBULATORY_CARE_PROVIDER_SITE_OTHER): Payer: Medicare HMO | Admitting: Internal Medicine

## 2013-08-23 VITALS — BP 140/90 | HR 68 | Temp 97.6°F | Resp 20 | Ht 67.0 in | Wt 247.0 lb

## 2013-08-23 DIAGNOSIS — I10 Essential (primary) hypertension: Secondary | ICD-10-CM

## 2013-08-23 DIAGNOSIS — M109 Gout, unspecified: Secondary | ICD-10-CM

## 2013-08-23 DIAGNOSIS — Z954 Presence of other heart-valve replacement: Secondary | ICD-10-CM

## 2013-08-23 MED ORDER — PREDNISONE 20 MG PO TABS: 20.0000 mg | ORAL_TABLET | Freq: Two times a day (BID) | ORAL | Status: DC

## 2013-08-23 NOTE — Progress Notes (Signed)
Subjective:    Patient ID: [name], male    DOB: 1946/01/13, 68 y.o.   MRN: [id number]  HPI  68 year old patient who is status post aortic valve repair who is on chronic Coumadin anticoagulation. For the past several days she has had pain and swelling involving his left hand. This is primarily over the left second MCP joint. He states he has had gout involving this area in the past. He has been treated with Coke is seen in the remote past. More recently he has been using ibuprofen 3 times daily  Past Medical History  Diagnosis Date  . Aortic stenosis 08/25/2010    a. Bentall aortic root replacement with a St. Jude mechanical valve and Hemashield conduit 02/2004.  . AV BLOCK, COMPLETE     a. s/p St Jude dual chamber pacemaker 02/2004.  Marland Kitchen CAD (coronary artery disease)     a. s/p CABGx2 (LIMA-dLAD, SVG-Cx). b. Low risk nuc 12/2012 without ischemia, EF 46% mild apical hypokinesia (EF 55% inf HK by echo).  . DIVERTICULOSIS, COLON 10/17/2007  . GOUT 01/16/2007  . HEMORRHOIDS, INTERNAL 11/01/2008  . HYPERLIPIDEMIA 01/16/2007  . HYPERTENSION 01/16/2007  . LEG EDEMA 11/27/2008  . Impaired glucose tolerance   . Carotid artery disease     a. 0-39% bilateral ICA stenosis, stable mild hard plaque in carotid bulbs. F/u 03/2014 recommended.  Marland Kitchen Special screening for malignant neoplasm of prostate   . Warfarin anticoagulation     AVR  . Ejection fraction     a. EF 55% with inf HK, mild MR by echo 12/2012.  . Asthma   . Arthritis   . GERD (gastroesophageal reflux disease)     hxof     History   Social History  . Marital Status: Married    Spouse Name: N/A    Number of Children: N/A  . Years of Education: N/A   Occupational History  . Not on file.   Social History Main Topics  . Smoking status: Former Smoker    Quit date: 07/12/1973  . Smokeless tobacco: Never Used  . Alcohol Use: No  . Drug Use: No  . Sexual Activity: No   Other Topics Concern  . Not on file   Social History  Narrative  . No narrative on file    Past Surgical History  Procedure Laterality Date  . Coronary artery bypass graft    . Aortic valve replacement    . Pacemaker insertion    . Shoulder surgery    . Cardiac catheterization  06/2003  . Circumcision    . Colonoscopy    . Hernia repair    . Coronary stents       prior to bypass   . Inguinal hernia repair Bilateral 02/07/2013    Procedure: LAPAROSCOPIC BILATERAL INGUINAL HERNIA REPAIR;  Surgeon: Imogene Burn. Georgette Dover, MD;  Location: [facility];  Service: General;  Laterality: Bilateral;  . Insertion of mesh Bilateral 02/07/2013    Procedure: INSERTION OF MESH;  Surgeon: Imogene Burn. Georgette Dover, MD;  Location: [facility];  Service: General;  Laterality: Bilateral;    Family History  Problem Relation Age of Onset  . Heart attack Father   . Cancer Father     renal cancer  . Diabetes Brother   . Hypertension Mother   . Diabetes Mother     Allergies  Allergen Reactions  . Codeine Phosphate     REACTION: difficulty breathing  . Metoprolol Tartrate     REACTION: loss  of vision    Current Outpatient Prescriptions on File Prior to Visit  Medication Sig Dispense Refill  . acetaminophen (TYLENOL) 325 MG tablet Take 2 tablets (650 mg total) by mouth every 6 (six) hours as needed.      . Ascorbic Acid (VITAMIN C) 500 MG tablet Take 1,000 mg by mouth daily.       Marland Kitchen aspirin 81 MG tablet Take 81 mg by mouth daily.        . calcium carbonate (OS-CAL) 600 MG TABS Take 600 mg by mouth 2 (two) times daily with a meal.      . furosemide (LASIX) 40 MG tablet Take 1 tablet (40 mg total) by mouth 2 (two) times daily.  180 tablet  1  . Glucosamine-Chondroitin 250-200 MG CAPS Take 2 capsules by mouth daily.       Marland Kitchen loratadine (CLARITIN) 10 MG tablet Take 10 mg by mouth daily.      . Meclizine HCl (BONINE) 25 MG CHEW Chew 1 tablet by mouth daily.        . metoprolol succinate (TOPROL-XL) 100 MG 24 hr tablet Take 1 tablet (100 mg total) by mouth every morning. Take  with or immediately following a meal.  90 tablet  1  . Misc Natural Products (TART CHERRY ADVANCED) CAPS Take 1 capsule by mouth every evening.      . Multiple Vitamin (MULTIVITAMIN) tablet Take 1 tablet by mouth daily.        . nitroGLYCERIN (NITROSTAT) 0.4 MG SL tablet Place 1 tablet (0.4 mg total) under the tongue every 5 (five) minutes as needed. For chest pain  90 tablet  1  . Omega-3 Fatty Acids (FISH OIL) 1000 MG CAPS Take 1,000 mg by mouth every morning.       . potassium chloride (KLOR-CON M10) 10 MEQ tablet Take 1 tablet (10 mEq total) by mouth 2 (two) times daily.  180 tablet  1  . pravastatin (PRAVACHOL) 40 MG tablet Take 1 tablet (40 mg total) by mouth every evening.  90 tablet  1  . ramipril (ALTACE) 10 MG capsule Take 1 capsule (10 mg total) by mouth daily.  90 capsule  1  . traMADol (ULTRAM) 50 MG tablet Take 50 mg by mouth every 6 (six) hours as needed for pain.       . vitamin B-12 (CYANOCOBALAMIN) 1000 MCG tablet Take 1,000 mcg by mouth daily.      Marland Kitchen warfarin (COUMADIN) 10 MG tablet TAKE ONE TABLET BY MOUTH AS DIRECTED  90 tablet  0  . [DISCONTINUED] potassium chloride (KLOR-CON 10) 10 MEQ CR tablet Take 1 tablet (10 mEq total) by mouth 2 (two) times daily.  180 tablet  6   No current facility-administered medications on file prior to visit.    BP 140/90  Pulse 68  Temp(Src) 97.6 F (36.4 C) (Oral)  Resp 20  Ht 5\' 7"  (1.702 m)  Wt 247 lb (112.038 kg)  BMI 38.68 kg/m2  SpO2 96%       Review of Systems  Constitutional: Negative for fever, chills, appetite change and fatigue.  HENT: Negative for congestion, dental problem, ear pain, hearing loss, sore throat, tinnitus, trouble swallowing and voice change.   Eyes: Negative for pain, discharge and visual disturbance.  Respiratory: Negative for cough, chest tightness, wheezing and stridor.   Cardiovascular: Negative for chest pain, palpitations and leg swelling.  Gastrointestinal: Negative for nausea, vomiting,  abdominal pain, diarrhea, constipation, blood in stool and abdominal distention.  Genitourinary: Negative for urgency, hematuria, flank pain, discharge, difficulty urinating and genital sores.  Musculoskeletal: Positive for arthralgias and joint swelling. Negative for back pain, gait problem, myalgias and neck stiffness.  Skin: Negative for rash.  Neurological: Negative for dizziness, syncope, speech difficulty, weakness, numbness and headaches.  Hematological: Negative for adenopathy. Does not bruise/bleed easily.  Psychiatric/Behavioral: Negative for behavioral problems and dysphoric mood. The patient is not nervous/anxious.        Objective:   Physical Exam  Constitutional: He appears well-developed and well-nourished. No distress.  Repeat blood pressure 140/84  Musculoskeletal:  Soft tissue swelling over the left second and third MCP joints; second MCP joint slightly tender and warm          Assessment & Plan:   Probable gouty arthritis. Will treat with oral prednisone short-term. Check uric acid level Hypertension Obesity  Information concerning gout  and a low purine dietdispensed Schedule CPX

## 2013-08-23 NOTE — Progress Notes (Signed)
Pre-visit discussion using our clinic review tool. No additional management support is needed unless otherwise documented below in the visit note.  

## 2013-08-23 NOTE — Patient Instructions (Addendum)
Limit your sodium (Salt) intake  Prednisone one twice daily for 6 daysGout Gout is an inflammatory arthritis caused by a buildup of uric acid crystals in the joints. Uric acid is a chemical that is normally present in the blood. When the level of uric acid in the blood is too high it can form crystals that deposit in your joints and tissues. This causes joint redness, soreness, and swelling (inflammation). Repeat attacks are common. Over time, uric acid crystals can form into masses (tophi) near a joint, destroying bone and causing disfigurement. Gout is treatable and often preventable. CAUSES  The disease begins with elevated levels of uric acid in the blood. Uric acid is produced by your body when it breaks down a naturally found substance called purines. Certain foods you eat, such as meats and fish, contain high amounts of purines. Causes of an elevated uric acid level include:  Being passed down from parent to child (heredity).  Diseases that cause increased uric acid production (such as obesity, psoriasis, and certain cancers).  Excessive alcohol use.  Diet, especially diets rich in meat and seafood.  Medicines, including certain cancer-fighting medicines (chemotherapy), water pills (diuretics), and aspirin.  Chronic kidney disease. The kidneys are no longer able to remove uric acid well.  Problems with metabolism. Conditions strongly associated with gout include:  Obesity.  High blood pressure.  High cholesterol.  Diabetes. Not everyone with elevated uric acid levels gets gout. It is not understood why some people get gout and others do not. Surgery, joint injury, and eating too much of certain foods are some of the factors that can lead to gout attacks. SYMPTOMS   An attack of gout comes on quickly. It causes intense pain with redness, swelling, and warmth in a joint.  Fever can occur.  Often, only one joint is involved. Certain joints are more commonly involved:  Base  of the big toe.  Knee.  Ankle.  Wrist.  Finger. Without treatment, an attack usually goes away in a few days to weeks. Between attacks, you usually will not have symptoms, which is different from many other forms of arthritis. DIAGNOSIS  Your caregiver will suspect gout based on your symptoms and exam. In some cases, tests may be recommended. The tests may include:  Blood tests.  Urine tests.  X-rays.  Joint fluid exam. This exam requires a needle to remove fluid from the joint (arthrocentesis). Using a microscope, gout is confirmed when uric acid crystals are seen in the joint fluid. TREATMENT  There are two phases to gout treatment: treating the sudden onset (acute) attack and preventing attacks (prophylaxis).  Treatment of an Acute Attack.  Medicines are used. These include anti-inflammatory medicines or steroid medicines.  An injection of steroid medicine into the affected joint is sometimes necessary.  The painful joint is rested. Movement can worsen the arthritis.  You may use warm or cold treatments on painful joints, depending which works best for you.  Treatment to Prevent Attacks.  If you suffer from frequent gout attacks, your caregiver may advise preventive medicine. These medicines are started after the acute attack subsides. These medicines either help your kidneys eliminate uric acid from your body or decrease your uric acid production. You may need to stay on these medicines for a very long time.  The early phase of treatment with preventive medicine can be associated with an increase in acute gout attacks. For this reason, during the first few months of treatment, your caregiver may also advise you  to take medicines usually used for acute gout treatment. Be sure you understand your caregiver's directions. Your caregiver may make several adjustments to your medicine dose before these medicines are effective.  Discuss dietary treatment with your caregiver or  dietitian. Alcohol and drinks high in sugar and fructose and foods such as meat, poultry, and seafood can increase uric acid levels. Your caregiver or dietician can advise you on drinks and foods that should be limited. HOME CARE INSTRUCTIONS   Do not take aspirin to relieve pain. This raises uric acid levels.  Only take over-the-counter or prescription medicines for pain, discomfort, or fever as directed by your caregiver.  Rest the joint as much as possible. When in bed, keep sheets and blankets off painful areas.  Keep the affected joint raised (elevated).  Apply warm or cold treatments to painful joints. Use of warm or cold treatments depends on which works best for you.  Use crutches if the painful joint is in your leg.  Drink enough fluids to keep your urine clear or pale yellow. This helps your body get rid of uric acid. Limit alcohol, sugary drinks, and fructose drinks.  Follow your dietary instructions. Pay careful attention to the amount of protein you eat. Your daily diet should emphasize fruits, vegetables, whole grains, and fat-free or low-fat milk products. Discuss the use of coffee, vitamin C, and cherries with your caregiver or dietician. These may be helpful in lowering uric acid levels.  Maintain a healthy body weight. SEEK MEDICAL CARE IF:   You develop diarrhea, vomiting, or any side effects from medicines.  You do not feel better in 24 hours, or you are getting worse. SEEK IMMEDIATE MEDICAL CARE IF:   Your joint becomes suddenly more tender, and you have chills or a fever. MAKE SURE YOU:   Understand these instructions.  Will watch your condition.  Will get help right away if you are not doing well or get worse. Document Released: 06/25/2000 Document Revised: 10/23/2012 Document Reviewed: 02/09/2012 [facility] Patient Information 2014 [place]. Purine Restricted Diet A low-purine diet consists of foods that reduce uric acid made in your  body. INDICATIONS FOR USE  Your caregiver may ask you to follow a low-purine diet to reduce gout flairs.  GUIDELINES  Avoid high-purine foods, including all alcohol, yeast extracts taken as supplements, and sauces made from meats (like gravy). Do not eat high-purine meats, including anchovies, sardines, herring, mussels, tuna, codfish, scallops, trout, haddock, bacon, organ meats, tripe, goose, wild game, and sweetbreads.  Grains  Allowed/Recommended: All, except those listed to consume in moderation.  Consume in Moderation: Oatmeal ( cup uncooked daily), wheat bran or germ ( cup daily), and whole grains. Vegetables  Allowed/Recommended: All, except those listed to consume in moderation.  Consume in Moderation: Asparagus, cauliflower, spinach, mushrooms, and green peas ( cup daily). Fruit  Allowed/Recommended: All.  Consume in Moderation: None. Meat and Meat Substitutes  Allowed/Recommended: Eggs, nuts, and peanut butter.  Consume in Moderation: Limit to 4 to 6 oz daily. Avoid high-purine meats. Lentils, peas, and dried beans (1 cup daily). Milk  Allowed/Recommended: All. Choose low-fat or skim when possible.  Consume in Moderation: None. Fats and Oils  Allowed/Recommended: All.  Consume in Moderation: None. Beverages  Allowed/Recommended: All, except those listed to avoid.  Avoid: All alcohol. Condiments/Miscellaneous  Allowed/Recommended: All, except those listed to consume in moderation.  Consume in Moderation: Bouillon and meat-based broths and soups. Document Released: 10/23/2010 Document Revised: 09/20/2011 Document Reviewed: 10/23/2010 [employer] Patient Information  2014 [place], [place].

## 2013-08-24 ENCOUNTER — Telehealth: Payer: Self-pay | Admitting: Internal Medicine

## 2013-08-24 LAB — URIC ACID: URIC ACID, SERUM: 7.3 mg/dL (ref 4.0–7.8)

## 2013-08-24 NOTE — Telephone Encounter (Signed)
Relevant patient education assigned to patient using Emmi. ° °

## 2013-08-27 ENCOUNTER — Other Ambulatory Visit: Payer: Self-pay | Admitting: *Deleted

## 2013-08-27 MED ORDER — PRAVASTATIN SODIUM 40 MG PO TABS: 40.0000 mg | ORAL_TABLET | Freq: Every evening | ORAL | Status: DC

## 2013-08-29 ENCOUNTER — Telehealth: Payer: Self-pay | Admitting: Internal Medicine

## 2013-08-29 NOTE — Telephone Encounter (Signed)
Pt needs new rx pravastatin  40mg  #90 w/refills fax [facility] [phone]

## 2013-08-29 NOTE — Telephone Encounter (Signed)
Spoke to pt told him Rx refill was sent on 2/16 to [facility]. Pt verbalized understanding.

## 2013-09-07 ENCOUNTER — Ambulatory Visit: Payer: Medicare Other | Admitting: Internal Medicine

## 2013-09-21 ENCOUNTER — Ambulatory Visit (INDEPENDENT_AMBULATORY_CARE_PROVIDER_SITE_OTHER): Payer: Commercial Managed Care - HMO | Admitting: Internal Medicine

## 2013-09-21 ENCOUNTER — Ambulatory Visit (INDEPENDENT_AMBULATORY_CARE_PROVIDER_SITE_OTHER): Payer: Commercial Managed Care - HMO | Admitting: *Deleted

## 2013-09-21 ENCOUNTER — Encounter: Payer: Self-pay | Admitting: Internal Medicine

## 2013-09-21 VITALS — BP 158/84 | HR 66 | Ht 67.0 in | Wt 241.0 lb

## 2013-09-21 DIAGNOSIS — I359 Nonrheumatic aortic valve disorder, unspecified: Secondary | ICD-10-CM | POA: Diagnosis not present

## 2013-09-21 DIAGNOSIS — Z5181 Encounter for therapeutic drug level monitoring: Secondary | ICD-10-CM

## 2013-09-21 DIAGNOSIS — Z950 Presence of cardiac pacemaker: Secondary | ICD-10-CM | POA: Diagnosis not present

## 2013-09-21 DIAGNOSIS — I442 Atrioventricular block, complete: Secondary | ICD-10-CM

## 2013-09-21 DIAGNOSIS — I2510 Atherosclerotic heart disease of native coronary artery without angina pectoris: Secondary | ICD-10-CM | POA: Diagnosis not present

## 2013-09-21 DIAGNOSIS — Z954 Presence of other heart-valve replacement: Secondary | ICD-10-CM | POA: Diagnosis not present

## 2013-09-21 DIAGNOSIS — Z7901 Long term (current) use of anticoagulants: Secondary | ICD-10-CM | POA: Diagnosis not present

## 2013-09-21 LAB — MDC_IDC_ENUM_SESS_TYPE_INCLINIC
Battery Impedance: 3300 Ohm
Battery Voltage: 2.78 V
Implantable Pulse Generator Model: 5386
Implantable Pulse Generator Serial Number: 1380400
Lead Channel Impedance Value: 539 Ohm
Lead Channel Pacing Threshold Amplitude: 0.625 V
Lead Channel Pacing Threshold Amplitude: 1 V
Lead Channel Pacing Threshold Pulse Width: 0.5 ms
Lead Channel Setting Pacing Amplitude: 2.5 V
Lead Channel Setting Sensing Sensitivity: 3 mV
MDC IDC MSMT LEADCHNL RA PACING THRESHOLD PULSEWIDTH: 0.5 ms
MDC IDC MSMT LEADCHNL RA SENSING INTR AMPL: 2.3 mV
MDC IDC MSMT LEADCHNL RV IMPEDANCE VALUE: 623 Ohm
MDC IDC SESS DTM: 20150313142208
MDC IDC SET LEADCHNL RV PACING PULSEWIDTH: 0.5 ms

## 2013-09-21 LAB — POCT INR: INR: 2.7

## 2013-09-21 NOTE — Progress Notes (Signed)
HPI [name] returns today for followup. He is a very pleasant 68 year old man with a history of symptomatic bradycardia due to complete heart block, status post permanent pacemaker insertion. He is s/p Bentall with CHB. He has done well in the interim. He again some weight and enrolled in [employer] and has lost over 30 pounds. His goal is to get down to 200 pounds. He denies chest pain, shortness of breath, or peripheral edema. No cough or hemoptysis. Allergies  Allergen Reactions  . Codeine Phosphate     REACTION: difficulty breathing  . Metoprolol Tartrate     REACTION: loss of vision     Current Outpatient Prescriptions  Medication Sig Dispense Refill  . acetaminophen (TYLENOL) 325 MG tablet Take 2 tablets (650 mg total) by mouth every 6 (six) hours as needed.      . Ascorbic Acid (VITAMIN C) 500 MG tablet Take 1,000 mg by mouth daily.       Marland Kitchen aspirin 81 MG tablet Take 81 mg by mouth daily.        . calcium carbonate (OS-CAL) 600 MG TABS Take 600 mg by mouth 2 (two) times daily with a meal.      . furosemide (LASIX) 40 MG tablet Take 1 tablet (40 mg total) by mouth 2 (two) times daily.  180 tablet  1  . Glucosamine-Chondroitin 250-200 MG CAPS Take 2 capsules by mouth daily.       Marland Kitchen loratadine (CLARITIN) 10 MG tablet Take 10 mg by mouth daily.      . Meclizine HCl (BONINE) 25 MG CHEW Chew 1 tablet by mouth daily.        . metoprolol succinate (TOPROL-XL) 100 MG 24 hr tablet Take 1 tablet (100 mg total) by mouth every morning. Take with or immediately following a meal.  90 tablet  1  . Misc Natural Products (TART CHERRY ADVANCED) CAPS Take 2 capsules by mouth every evening.       . Multiple Vitamin (MULTIVITAMIN) tablet Take 1 tablet by mouth daily.        . nitroGLYCERIN (NITROSTAT) 0.4 MG SL tablet Place 1 tablet (0.4 mg total) under the tongue every 5 (five) minutes as needed. For chest pain  90 tablet  1  . Omega-3 Fatty Acids (FISH OIL) 1000 MG CAPS Take 1,000 mg by mouth  every morning.       . potassium chloride (KLOR-CON M10) 10 MEQ tablet Take 1 tablet (10 mEq total) by mouth 2 (two) times daily.  180 tablet  1  . pravastatin (PRAVACHOL) 40 MG tablet Take 1 tablet (40 mg total) by mouth every evening.  90 tablet  3  . ramipril (ALTACE) 10 MG capsule Take 1 capsule (10 mg total) by mouth daily.  90 capsule  1  . tadalafil (CIALIS) 10 MG tablet Take 40 mg by mouth daily as needed.      . traMADol (ULTRAM) 50 MG tablet Take 50 mg by mouth every 6 (six) hours as needed for pain.       . vitamin B-12 (CYANOCOBALAMIN) 1000 MCG tablet Take 1,000 mcg by mouth daily.      Marland Kitchen warfarin (COUMADIN) 10 MG tablet TAKE ONE TABLET BY MOUTH AS DIRECTED  90 tablet  0  . [DISCONTINUED] potassium chloride (KLOR-CON 10) 10 MEQ CR tablet Take 1 tablet (10 mEq total) by mouth 2 (two) times daily.  180 tablet  6   No current facility-administered medications for this visit.  Past Medical History  Diagnosis Date  . Aortic stenosis 08/25/2010    a. Bentall aortic root replacement with a St. Jude mechanical valve and Hemashield conduit 02/2004.  . AV BLOCK, COMPLETE     a. s/p St Jude dual chamber pacemaker 02/2004.  Marland Kitchen CAD (coronary artery disease)     a. s/p CABGx2 (LIMA-dLAD, SVG-Cx). b. Low risk nuc 12/2012 without ischemia, EF 46% mild apical hypokinesia (EF 55% inf HK by echo).  . DIVERTICULOSIS, COLON 10/17/2007  . GOUT 01/16/2007  . HEMORRHOIDS, INTERNAL 11/01/2008  . HYPERLIPIDEMIA 01/16/2007  . HYPERTENSION 01/16/2007  . LEG EDEMA 11/27/2008  . Impaired glucose tolerance   . Carotid artery disease     a. 0-39% bilateral ICA stenosis, stable mild hard plaque in carotid bulbs. F/u 03/2014 recommended.  Marland Kitchen Special screening for malignant neoplasm of prostate   . Warfarin anticoagulation     AVR  . Ejection fraction     a. EF 55% with inf HK, mild MR by echo 12/2012.  . Asthma   . Arthritis   . GERD (gastroesophageal reflux disease)     hxof     ROS:   All systems reviewed  and negative except as noted in the HPI.   Past Surgical History  Procedure Laterality Date  . Coronary artery bypass graft    . Aortic valve replacement    . Pacemaker insertion    . Shoulder surgery    . Cardiac catheterization  06/2003  . Circumcision    . Colonoscopy    . Hernia repair    . Coronary stents       prior to bypass   . Inguinal hernia repair Bilateral 02/07/2013    Procedure: LAPAROSCOPIC BILATERAL INGUINAL HERNIA REPAIR;  Surgeon: Imogene Burn. Georgette Dover, MD;  Location: [facility];  Service: General;  Laterality: Bilateral;  . Insertion of mesh Bilateral 02/07/2013    Procedure: INSERTION OF MESH;  Surgeon: Imogene Burn. Georgette Dover, MD;  Location: [facility];  Service: General;  Laterality: Bilateral;     Family History  Problem Relation Age of Onset  . Heart attack Father   . Cancer Father     renal cancer  . Diabetes Brother   . Hypertension Mother   . Diabetes Mother      History   Social History  . Marital Status: Married    Spouse Name: N/A    Number of Children: N/A  . Years of Education: N/A   Occupational History  . Not on file.   Social History Main Topics  . Smoking status: Former Smoker    Quit date: 07/12/1973  . Smokeless tobacco: Never Used  . Alcohol Use: No  . Drug Use: No  . Sexual Activity: No   Other Topics Concern  . Not on file   Social History Narrative  . No narrative on file     BP 158/84  Pulse 66  Ht 5\' 7"  (1.702 m)  Wt 241 lb (109.317 kg)  BMI 37.74 kg/m2  Physical Exam:  Well appearing 68 year old man,NAD HEENT: Unremarkable Neck:  7 cm JVD, no thyromegally Lungs:  Clear with no wheezes, rales, or rhonchi. HEART:  Regular rate rhythm, no murmurs, no rubs, no clicks, mechanical S2 Abd:  soft, positive bowel sounds, no organomegally, no rebound, no guarding Ext:  2 plus pulses, no edema, no cyanosis, no clubbing Skin:  No rashes no nodules Neuro:  CN II through XII intact, motor grossly intact  DEVICE  Normal device  function.  See PaceArt for details.   Assess/Plan:

## 2013-09-21 NOTE — Assessment & Plan Note (Signed)
He is s/p bypass doing well with no anginal symptoms. Continue current meds.

## 2013-09-21 NOTE — Assessment & Plan Note (Signed)
His St. Jude DDD PM is working normally. Will recheck in several months. 

## 2013-09-21 NOTE — Patient Instructions (Addendum)
Your physician recommends that you continue on your current medications as directed. Please refer to the Current Medication list given to you today.  Continue [employer] checks every 3 months.  Your physician recommends that you schedule a follow-up appointment in: 12 months with Dr.Taylor

## 2013-10-01 ENCOUNTER — Telehealth: Payer: Self-pay | Admitting: *Deleted

## 2013-10-01 MED ORDER — WARFARIN SODIUM 10 MG PO TABS: ORAL_TABLET | ORAL | Status: DC

## 2013-10-01 NOTE — Telephone Encounter (Signed)
Patient requests warfarin refill to be sent to [facility]. Thanks, MI

## 2013-10-22 ENCOUNTER — Telehealth: Payer: Self-pay | Admitting: Internal Medicine

## 2013-10-22 NOTE — Telephone Encounter (Signed)
Spoke to pt, told him I do not have that he is using albuterol inhaler. Pt said just as needed and then passed the phone to his wife and said it is Combivent inhaler. Told pt and wife I will have to check with Dr. Raliegh Ip and get back to him. Pt verbalized understanding.

## 2013-10-22 NOTE — Telephone Encounter (Signed)
Dr. K, please see message and advise. 

## 2013-10-22 NOTE — Telephone Encounter (Signed)
Pt request albuteral inhaler, Respinal, (Ipratropium)  Could not find on med list. [place] Mail order 90 days

## 2013-10-22 NOTE — Telephone Encounter (Signed)
Ok to RF? 

## 2013-10-23 MED ORDER — IPRATROPIUM-ALBUTEROL 20-100 MCG/ACT IN AERS: 1.0000 | INHALATION_SPRAY | Freq: Four times a day (QID) | RESPIRATORY_TRACT | Status: DC | PRN

## 2013-10-23 MED ORDER — IPRATROPIUM-ALBUTEROL 20-100 MCG/ACT IN AERS: 1.0000 | INHALATION_SPRAY | Freq: Four times a day (QID) | RESPIRATORY_TRACT | Status: DC

## 2013-10-23 NOTE — Telephone Encounter (Signed)
Left message on voicemail to call office.  

## 2013-10-23 NOTE — Telephone Encounter (Signed)
[name] wife [name] called back, asked her what pt is using? [name] said he uses Combivent like her, they only use it during allergy season. Told her okay will send Rx to [facility]. [name] verbalized understanding. Rx sent.

## 2013-11-02 ENCOUNTER — Ambulatory Visit (INDEPENDENT_AMBULATORY_CARE_PROVIDER_SITE_OTHER): Payer: Commercial Managed Care - HMO

## 2013-11-02 DIAGNOSIS — Z5181 Encounter for therapeutic drug level monitoring: Secondary | ICD-10-CM

## 2013-11-02 DIAGNOSIS — Z7901 Long term (current) use of anticoagulants: Secondary | ICD-10-CM

## 2013-11-02 DIAGNOSIS — Z954 Presence of other heart-valve replacement: Secondary | ICD-10-CM

## 2013-11-02 DIAGNOSIS — I359 Nonrheumatic aortic valve disorder, unspecified: Secondary | ICD-10-CM | POA: Diagnosis not present

## 2013-11-02 LAB — POCT INR: INR: 2.9

## 2013-12-14 ENCOUNTER — Ambulatory Visit (INDEPENDENT_AMBULATORY_CARE_PROVIDER_SITE_OTHER): Payer: Medicare HMO | Admitting: *Deleted

## 2013-12-14 DIAGNOSIS — Z7901 Long term (current) use of anticoagulants: Secondary | ICD-10-CM

## 2013-12-14 DIAGNOSIS — I359 Nonrheumatic aortic valve disorder, unspecified: Secondary | ICD-10-CM

## 2013-12-14 DIAGNOSIS — Z954 Presence of other heart-valve replacement: Secondary | ICD-10-CM

## 2013-12-14 DIAGNOSIS — Z5181 Encounter for therapeutic drug level monitoring: Secondary | ICD-10-CM

## 2013-12-14 LAB — POCT INR: INR: 3

## 2013-12-17 ENCOUNTER — Telehealth: Payer: Self-pay | Admitting: Internal Medicine

## 2013-12-17 NOTE — Telephone Encounter (Signed)
Pt needs [facility] referral for [facility]/ dr Ayesha Rumpf on Tues 6/9 5:30 pm Pt has been seen before at this office Npi:  [phone]  referral fax line: [phone]

## 2013-12-24 ENCOUNTER — Encounter: Payer: Self-pay | Admitting: Internal Medicine

## 2013-12-24 DIAGNOSIS — I442 Atrioventricular block, complete: Secondary | ICD-10-CM

## 2014-01-25 ENCOUNTER — Ambulatory Visit (INDEPENDENT_AMBULATORY_CARE_PROVIDER_SITE_OTHER): Payer: Commercial Managed Care - HMO | Admitting: *Deleted

## 2014-01-25 DIAGNOSIS — Z954 Presence of other heart-valve replacement: Secondary | ICD-10-CM

## 2014-01-25 DIAGNOSIS — Z5181 Encounter for therapeutic drug level monitoring: Secondary | ICD-10-CM

## 2014-01-25 DIAGNOSIS — Z7901 Long term (current) use of anticoagulants: Secondary | ICD-10-CM

## 2014-01-25 DIAGNOSIS — I359 Nonrheumatic aortic valve disorder, unspecified: Secondary | ICD-10-CM | POA: Diagnosis not present

## 2014-01-25 LAB — POCT INR: INR: 2.6

## 2014-02-07 ENCOUNTER — Ambulatory Visit (INDEPENDENT_AMBULATORY_CARE_PROVIDER_SITE_OTHER): Payer: Commercial Managed Care - HMO | Admitting: Cardiology

## 2014-02-07 ENCOUNTER — Encounter: Payer: Self-pay | Admitting: Cardiology

## 2014-02-07 VITALS — BP 137/82 | HR 74 | Ht 67.75 in | Wt 244.1 lb

## 2014-02-07 DIAGNOSIS — I779 Disorder of arteries and arterioles, unspecified: Secondary | ICD-10-CM

## 2014-02-07 DIAGNOSIS — Z7901 Long term (current) use of anticoagulants: Secondary | ICD-10-CM

## 2014-02-07 DIAGNOSIS — R05 Cough: Secondary | ICD-10-CM

## 2014-02-07 DIAGNOSIS — I2510 Atherosclerotic heart disease of native coronary artery without angina pectoris: Secondary | ICD-10-CM

## 2014-02-07 DIAGNOSIS — Z950 Presence of cardiac pacemaker: Secondary | ICD-10-CM

## 2014-02-07 DIAGNOSIS — I350 Nonrheumatic aortic (valve) stenosis: Secondary | ICD-10-CM

## 2014-02-07 DIAGNOSIS — I359 Nonrheumatic aortic valve disorder, unspecified: Secondary | ICD-10-CM

## 2014-02-07 DIAGNOSIS — Z954 Presence of other heart-valve replacement: Secondary | ICD-10-CM

## 2014-02-07 DIAGNOSIS — I25810 Atherosclerosis of coronary artery bypass graft(s) without angina pectoris: Secondary | ICD-10-CM

## 2014-02-07 DIAGNOSIS — I739 Peripheral vascular disease, unspecified: Secondary | ICD-10-CM

## 2014-02-07 DIAGNOSIS — R059 Cough, unspecified: Secondary | ICD-10-CM

## 2014-02-07 MED ORDER — OMEPRAZOLE MAGNESIUM 20 MG PO TBEC: 20.0000 mg | DELAYED_RELEASE_TABLET | Freq: Every day | ORAL | Status: DC

## 2014-02-07 MED ORDER — ATORVASTATIN CALCIUM 40 MG PO TABS: 40.0000 mg | ORAL_TABLET | Freq: Every day | ORAL | Status: DC

## 2014-02-07 NOTE — Assessment & Plan Note (Signed)
His aortic stenosis has been treated by an aortic valve replacement in 2005. We know from 2013 that the valve is working well by echo. No further workup now.

## 2014-02-07 NOTE — Assessment & Plan Note (Signed)
He is anticoagulated for his aortic valve prosthesis.

## 2014-02-07 NOTE — Assessment & Plan Note (Signed)
The patient had mild carotid disease in the past. He will need a followup Doppler in the next 6 months.

## 2014-02-07 NOTE — Assessment & Plan Note (Signed)
Patient's aortic valve is working well. No further workup.

## 2014-02-07 NOTE — Assessment & Plan Note (Signed)
He may be having some symptoms since he does have some sputum in the morning. However I wonder if he is having symptoms related to GERD. However be recommending omeprazole for short-term trial. He'll be following up with his primary physician.  As part of today's evaluation I spent greater than 25 minutes with is [facility]. More than half of this time was with direct contact speaking with the patient and his wife about all of his issues

## 2014-02-07 NOTE — Assessment & Plan Note (Signed)
Patient underwent bypass surgery at the time of his aortic valve replacement. He had no ischemia his nuclear study within the past few years. I've recommended that we change him from Pravachol to atorvastatin for guidelines directed therapy.

## 2014-02-07 NOTE — Patient Instructions (Signed)
**Note De-Identified  Obfuscation** Your physician has recommended you make the following change in your medication: stop taking Pravachol and start taking Atorvastatin 40 mg at bedtime and start taking over the counter Omeprazole 20 mg twice daily 30 to 60 mins before breakfast and supper for 1 week then only take 1 tablet daily 30 to 60 mins. Before breakfast there after.  Your physician has requested that you have a carotid duplex. This test is an ultrasound of the carotid arteries in your neck. It looks at blood flow through these arteries that supply the brain with blood. Allow one hour for this exam. There are no restrictions or special instructions.  Your physician wants you to follow-up in: 1 year. You will receive a reminder letter in the mail two months in advance. If you don't receive a letter, please call our office to schedule the follow-up appointment.

## 2014-02-07 NOTE — Assessment & Plan Note (Signed)
Pacemaker is working well. No change in therapy.

## 2014-02-07 NOTE — Progress Notes (Signed)
Patient ID: [name], male   DOB: 1945/12/22, 68 y.o.   MRN: [id number]    HPI  Patient is seen today for followup coronary disease and aortic valve disease. He saw Dr. Lovena Le in March, 2015. His pacemaker was working well. I saw him last July, 2014. We know from 2013 that his echo revealed good LV function with some inferior hypokinesis. His prosthetic aortic valve was working well. There was mild mitral regurgitation. His nuclear scan at that time revealed no significant ischemia.  The patient is doing well. He has had some problems with a cough. This appears to be respiratory. He has some sputum in the morning. He'll be following up with his primary physician. He's not been having any significant chest pain. Walking is limited because of his knee discomfort.  He has some symptoms that may be GERD.  Allergies  Allergen Reactions  . Codeine Phosphate Hives, Itching, Rash and Other (See Comments)    REACTION: difficulty breathing  . Metoprolol Tartrate Other (See Comments)    REACTION: loss of vision    Current Outpatient Prescriptions  Medication Sig Dispense Refill  . acetaminophen (TYLENOL) 325 MG tablet Take 2 tablets (650 mg total) by mouth every 6 (six) hours as needed.      . Ascorbic Acid (VITAMIN C) 500 MG tablet Take 1,000 mg by mouth daily.       Marland Kitchen aspirin 81 MG tablet Take 81 mg by mouth daily.        . calcium carbonate (OS-CAL) 600 MG TABS Take 600 mg by mouth 2 (two) times daily with a meal.      . furosemide (LASIX) 40 MG tablet Take 1 tablet (40 mg total) by mouth 2 (two) times daily.  180 tablet  1  . Glucosamine-Chondroitin 250-200 MG CAPS Take 2 capsules by mouth daily.       . Ipratropium-Albuterol (COMBIVENT) 20-100 MCG/ACT AERS respimat Inhale 1 puff into the lungs every 6 (six) hours as needed for wheezing.  3 Inhaler  1  . loratadine (CLARITIN) 10 MG tablet Take 10 mg by mouth daily.      . Meclizine HCl (BONINE) 25 MG CHEW Chew 1 tablet by mouth daily.         . metoprolol succinate (TOPROL-XL) 100 MG 24 hr tablet Take 1 tablet (100 mg total) by mouth every morning. Take with or immediately following a meal.  90 tablet  1  . Misc Natural Products (TART CHERRY ADVANCED) CAPS Take 2 capsules by mouth every evening.       . Multiple Vitamin (MULTIVITAMIN) tablet Take 1 tablet by mouth daily.        . nitroGLYCERIN (NITROSTAT) 0.4 MG SL tablet Place 1 tablet (0.4 mg total) under the tongue every 5 (five) minutes as needed. For chest pain  90 tablet  1  . Omega-3 Fatty Acids (FISH OIL) 1000 MG CAPS Take 1,000 mg by mouth every morning.       . potassium chloride (KLOR-CON M10) 10 MEQ tablet Take 1 tablet (10 mEq total) by mouth 2 (two) times daily.  180 tablet  1  . pravastatin (PRAVACHOL) 40 MG tablet Take 1 tablet (40 mg total) by mouth every evening.  90 tablet  3  . ramipril (ALTACE) 10 MG capsule Take 1 capsule (10 mg total) by mouth daily.  90 capsule  1  . tadalafil (CIALIS) 10 MG tablet Take 40 mg by mouth daily as needed.      Marland Kitchen  traMADol (ULTRAM) 50 MG tablet Take 50 mg by mouth every 6 (six) hours as needed for pain.       . vitamin B-12 (CYANOCOBALAMIN) 1000 MCG tablet Take 1,000 mcg by mouth daily.      Marland Kitchen warfarin (COUMADIN) 10 MG tablet 1 pill everyday or as directed by coumadin clinic  90 tablet  1  . [DISCONTINUED] potassium chloride (KLOR-CON 10) 10 MEQ CR tablet Take 1 tablet (10 mEq total) by mouth 2 (two) times daily.  180 tablet  6   No current facility-administered medications for this visit.    History   Social History  . Marital Status: Married    Spouse Name: N/A    Number of Children: N/A  . Years of Education: N/A   Occupational History  . Not on file.   Social History Main Topics  . Smoking status: Former Smoker    Quit date: 07/12/1973  . Smokeless tobacco: Never Used  . Alcohol Use: No  . Drug Use: No  . Sexual Activity: No   Other Topics Concern  . Not on file   Social History Narrative  . No narrative  on file    Family History  Problem Relation Age of Onset  . Heart attack Father   . Cancer Father     renal cancer  . Diabetes Brother   . Hypertension Mother   . Diabetes Mother     Past Medical History  Diagnosis Date  . Aortic stenosis 08/25/2010    a. [place] aortic root replacement with a St. Jude mechanical valve and Hemashield conduit 02/2004.  . AV BLOCK, COMPLETE     a. s/p St Jude dual chamber pacemaker 02/2004.  Marland Kitchen CAD (coronary artery disease)     a. s/p CABGx2 (LIMA-dLAD, SVG-Cx). b. Low risk nuc 12/2012 without ischemia, EF 46% mild apical hypokinesia (EF 55% inf HK by echo).  . DIVERTICULOSIS, COLON 10/17/2007  . GOUT 01/16/2007  . HEMORRHOIDS, INTERNAL 11/01/2008  . HYPERLIPIDEMIA 01/16/2007  . HYPERTENSION 01/16/2007  . LEG EDEMA 11/27/2008  . Impaired glucose tolerance   . Carotid artery disease     a. 0-39% bilateral ICA stenosis, stable mild hard plaque in carotid bulbs. F/u 03/2014 recommended.  Marland Kitchen Special screening for malignant neoplasm of prostate   . Warfarin anticoagulation     AVR  . Ejection fraction     a. EF 55% with inf HK, mild MR by echo 12/2012.  . Asthma   . Arthritis   . GERD (gastroesophageal reflux disease)     hxof     Past Surgical History  Procedure Laterality Date  . Coronary artery bypass graft    . Aortic valve replacement    . Pacemaker insertion    . Shoulder surgery    . Cardiac catheterization  06/2003  . Circumcision    . Colonoscopy    . Hernia repair    . Coronary stents       prior to bypass   . Inguinal hernia repair Bilateral 02/07/2013    Procedure: LAPAROSCOPIC BILATERAL INGUINAL HERNIA REPAIR;  Surgeon: Imogene Burn. Georgette Dover, MD;  Location: [facility];  Service: General;  Laterality: Bilateral;  . Insertion of mesh Bilateral 02/07/2013    Procedure: INSERTION OF MESH;  Surgeon: Imogene Burn. Georgette Dover, MD;  Location: [facility];  Service: General;  Laterality: Bilateral;    Patient Active Problem List   Diagnosis Date Noted  . Encounter  for therapeutic drug monitoring 08/08/2013  . Aortic valve  replaced 02/21/2013  . Abdominal pain 02/09/2013  . Anuria 02/09/2013  . Acute renal failure 02/09/2013  . Hypotension 02/09/2013  . Anemia 02/09/2013  . Hyperglycemia 02/09/2013  . Acute blood loss anemia 02/09/2013  . Leukocytosis 02/09/2013  . Lactic acidosis 02/09/2013  . Preop cardiovascular exam 01/23/2013  . Mitral regurgitation 01/21/2013  . Recurrent right inguinal hernia 01/02/2013  . Left inguinal hernia 01/02/2013  . Impaired glucose tolerance   . Carotid artery disease   . Warfarin anticoagulation   . Ejection fraction   . Hx of CABG   . Aortic stenosis 08/25/2010  . DIZZINESS 06/19/2009  . AORTIC VALVE REPLACEMENT, HX OF 05/23/2009  . LEG EDEMA 11/27/2008  . AV BLOCK, COMPLETE 11/01/2008  . PACEMAKER, PERMANENT 11/01/2008  . DIVERTICULOSIS, COLON 10/17/2007  . GOUT 01/16/2007  . CAD (coronary artery disease) 01/16/2007  . HYPERLIPIDEMIA 01/16/2007  . HYPERTENSION 01/16/2007    ROS   Patient denies fever, chills, headache, sweats, rash, change in vision, change in hearing, chest pain, nausea vomiting, urinary symptoms.  PHYSICAL EXAM   Patient is overweight. He is here with his wife. He is oriented to person time and place. Affect is normal. Head is atraumatic. Sclera and conjunctiva are normal. There is no jugulovenous distention. Lungs are clear. Respiratory effort is not labored.  Cardiac exam reveals S1 and S2. There is crisp closure of his aortic prosthesis. Abdomen is soft. There is no peripheral edema. There no musculoskeletal deformities. There are no skin rashes.  EKG is done today and reviewed by me. There is an electronic pacemaker sensing the atrium and pacing the ventricle.  ASSESSMENT & PLAN

## 2014-02-13 ENCOUNTER — Other Ambulatory Visit (INDEPENDENT_AMBULATORY_CARE_PROVIDER_SITE_OTHER): Payer: Commercial Managed Care - HMO

## 2014-02-13 ENCOUNTER — Ambulatory Visit (INDEPENDENT_AMBULATORY_CARE_PROVIDER_SITE_OTHER): Payer: Commercial Managed Care - HMO | Admitting: *Deleted

## 2014-02-13 DIAGNOSIS — Z0000 Encounter for general adult medical examination without abnormal findings: Secondary | ICD-10-CM

## 2014-02-13 DIAGNOSIS — Z23 Encounter for immunization: Secondary | ICD-10-CM

## 2014-02-13 LAB — HEPATIC FUNCTION PANEL
ALK PHOS: 47 U/L (ref 39–117)
ALT: 17 U/L (ref 0–53)
AST: 23 U/L (ref 0–37)
Albumin: 3.9 g/dL (ref 3.5–5.2)
BILIRUBIN DIRECT: 0.1 mg/dL (ref 0.0–0.3)
BILIRUBIN TOTAL: 0.7 mg/dL (ref 0.2–1.2)
Total Protein: 6.2 g/dL (ref 6.0–8.3)

## 2014-02-13 LAB — CBC WITH DIFFERENTIAL/PLATELET
BASOS ABS: 0 10*3/uL (ref 0.0–0.1)
Basophils Relative: 0.5 % (ref 0.0–3.0)
EOS ABS: 0.1 10*3/uL (ref 0.0–0.7)
Eosinophils Relative: 1.2 % (ref 0.0–5.0)
HCT: 44 % (ref 39.0–52.0)
Hemoglobin: 14.6 g/dL (ref 13.0–17.0)
LYMPHS PCT: 21.9 % (ref 12.0–46.0)
Lymphs Abs: 1.6 10*3/uL (ref 0.7–4.0)
MCHC: 33.3 g/dL (ref 30.0–36.0)
MCV: 89.6 fl (ref 78.0–100.0)
Monocytes Absolute: 0.5 10*3/uL (ref 0.1–1.0)
Monocytes Relative: 6.7 % (ref 3.0–12.0)
NEUTROS PCT: 69.7 % (ref 43.0–77.0)
Neutro Abs: 4.9 10*3/uL (ref 1.4–7.7)
Platelets: 170 10*3/uL (ref 150.0–400.0)
RBC: 4.91 Mil/uL (ref 4.22–5.81)
RDW: 14.2 % (ref 11.5–15.5)
WBC: 7.1 10*3/uL (ref 4.0–10.5)

## 2014-02-13 LAB — TSH: TSH: 1.77 u[IU]/mL (ref 0.35–4.50)

## 2014-02-13 LAB — LIPID PANEL
CHOLESTEROL: 192 mg/dL (ref 0–200)
HDL: 36.8 mg/dL — ABNORMAL LOW (ref >39.00)
LDL Cholesterol: 121 mg/dL — ABNORMAL HIGH (ref 0–99)
NonHDL: 155.2
Total CHOL/HDL Ratio: 5
Triglycerides: 171 mg/dL — ABNORMAL HIGH (ref 0.0–149.0)
VLDL: 34.2 mg/dL (ref 0.0–40.0)

## 2014-02-13 LAB — BASIC METABOLIC PANEL
BUN: 16 mg/dL (ref 6–23)
CALCIUM: 8.9 mg/dL (ref 8.4–10.5)
CO2: 31 mEq/L (ref 19–32)
CREATININE: 0.9 mg/dL (ref 0.4–1.5)
Chloride: 103 mEq/L (ref 96–112)
GFR: 87.63 mL/min (ref >60.00)
GLUCOSE: 101 mg/dL — AB (ref 70–99)
Potassium: 4.6 mEq/L (ref 3.5–5.1)
Sodium: 139 mEq/L (ref 135–145)

## 2014-02-13 LAB — POCT URINALYSIS DIPSTICK
BILIRUBIN UA: NEGATIVE
Blood, UA: NEGATIVE
GLUCOSE UA: NEGATIVE
Leukocytes, UA: NEGATIVE
Nitrite, UA: NEGATIVE
Spec Grav, UA: 1.02
Urobilinogen, UA: 0.2
pH, UA: 5.5

## 2014-02-13 LAB — PSA: PSA: 1.04 ng/mL (ref 0.10–4.00)

## 2014-02-15 ENCOUNTER — Other Ambulatory Visit: Payer: Self-pay | Admitting: Internal Medicine

## 2014-02-19 ENCOUNTER — Ambulatory Visit (INDEPENDENT_AMBULATORY_CARE_PROVIDER_SITE_OTHER): Payer: Commercial Managed Care - HMO | Admitting: Internal Medicine

## 2014-02-19 ENCOUNTER — Encounter: Payer: Self-pay | Admitting: Internal Medicine

## 2014-02-19 VITALS — BP 150/90 | HR 68 | Temp 98.1°F | Resp 20 | Ht 67.0 in | Wt 243.0 lb

## 2014-02-19 DIAGNOSIS — Z954 Presence of other heart-valve replacement: Secondary | ICD-10-CM

## 2014-02-19 DIAGNOSIS — E785 Hyperlipidemia, unspecified: Secondary | ICD-10-CM

## 2014-02-19 DIAGNOSIS — I10 Essential (primary) hypertension: Secondary | ICD-10-CM

## 2014-02-19 DIAGNOSIS — Z7901 Long term (current) use of anticoagulants: Secondary | ICD-10-CM

## 2014-02-19 DIAGNOSIS — I442 Atrioventricular block, complete: Secondary | ICD-10-CM

## 2014-02-19 DIAGNOSIS — Z952 Presence of prosthetic heart valve: Secondary | ICD-10-CM

## 2014-02-19 DIAGNOSIS — Z951 Presence of aortocoronary bypass graft: Secondary | ICD-10-CM

## 2014-02-19 DIAGNOSIS — Z0000 Encounter for general adult medical examination without abnormal findings: Secondary | ICD-10-CM

## 2014-02-19 NOTE — Patient Instructions (Signed)
Limit your sodium (Salt) intake  You need to lose weight.  Consider a lower calorie diet and regular exercise.    It is important that you exercise regularly, at least 20 minutes 3 to 4 times per week.  If you develop chest pain or shortness of breath seek  medical attention.  Cardiac Diet This diet can help prevent heart disease and stroke. Many factors influence your heart health, including eating and exercise habits. Coronary risk rises a lot with abnormal blood fat (lipid) levels. Cardiac meal planning includes limiting unhealthy fats, increasing healthy fats, and making other small dietary changes. General guidelines are as follows:  Adjust calorie intake to reach and maintain desirable body weight.  Limit total fat intake to less than 30% of total calories. Saturated fat should be less than 7% of calories.  Saturated fats are found in animal products and in some vegetable products. Saturated vegetable fats are found in coconut oil, cocoa butter, palm oil, and palm kernel oil. Read labels carefully to avoid these products as much as possible. Use butter in moderation. Choose tub margarines and oils that have 2 grams of fat or less. Good cooking oils are canola and olive oils.  Practice low-fat cooking techniques. Do not fry food. Instead, broil, bake, boil, steam, grill, roast on a rack, stir-fry, or microwave it. Other fat reducing suggestions include:  Remove the skin from poultry.  Remove all visible fat from meats.  Skim the fat off stews, soups, and gravies before serving them.  Steam vegetables in water or broth instead of sauting them in fat.  Avoid foods with trans fat (or hydrogenated oils), such as commercially fried foods and commercially baked goods. Commercial shortening and deep-frying fats will contain trans fat.  Increase intake of fruits, vegetables, whole grains, and legumes to replace foods high in fat.  Increase consumption of nuts, legumes, and seeds to at  least 4 servings weekly. One serving of a legume equals  cup, and 1 serving of nuts or seeds equals  cup.  Choose whole grains more often. Have 3 servings per day (a serving is 1 ounce [oz]).  Eat 4 to 5 servings of vegetables per day. A serving of vegetables is 1 cup of raw leafy vegetables;  cup of raw or cooked cut-up vegetables;  cup of vegetable juice.  Eat 4 to 5 servings of fruit per day. A serving of fruit is 1 medium whole fruit;  cup of dried fruit;  cup of fresh, frozen, or canned fruit;  cup of 100% fruit juice.  Increase your intake of dietary fiber to 20 to 30 grams per day. Insoluble fiber may help lower your risk of heart disease and may help curb your appetite.  Soluble fiber binds cholesterol to be removed from the blood. Foods high in soluble fiber are dried beans, citrus fruits, oats, apples, bananas, broccoli, Brussels sprouts, and eggplant.  Try to include foods fortified with plant sterols or stanols, such as yogurt, breads, juices, or margarines. Choose several fortified foods to achieve a daily intake of 2 to 3 grams of plant sterols or stanols.  Foods with omega-3 fats can help reduce your risk of heart disease. Aim to have a 3.5 oz portion of fatty fish twice per week, such as salmon, mackerel, albacore tuna, sardines, lake trout, or herring. If you wish to take a fish oil supplement, choose one that contains 1 gram of both DHA and EPA.  Limit processed meats to 2 servings (3 oz portion)  weekly.  Limit the sodium in your diet to 1500 milligrams (mg) per day. If you have high blood pressure, talk to a registered dietitian about a DASH (Dietary Approaches to Stop Hypertension) eating plan.  Limit sweets and beverages with added sugar, such as soda, to no more than 5 servings per week. One serving is:   1 tablespoon sugar.  1 tablespoon jelly or jam.   cup sorbet.  1 cup lemonade.   cup regular soda. CHOOSING FOODS Starches  Allowed: Breads: All  kinds (wheat, rye, raisin, white, oatmeal, [place], [place], and English muffin bread). Low-fat rolls: English muffins, frankfurter and hamburger buns, bagels, pita bread, tortillas (not fried). Pancakes, waffles, biscuits, and muffins made with recommended oil.  Avoid: Products made with saturated or trans fats, oils, or whole milk products. Butter rolls, cheese breads, croissants. Commercial doughnuts, muffins, sweet rolls, biscuits, waffles, pancakes, store-bought mixes. Crackers  Allowed: Low-fat crackers and snacks: Animal, graham, rye, saltine (with recommended oil, no lard), oyster, and matzo crackers. Bread sticks, melba toast, rusks, flatbread, pretzels, and light popcorn.  Avoid: High-fat crackers: cheese crackers, butter crackers, and those made with coconut, palm oil, or trans fat (hydrogenated oils). Buttered popcorn. Cereals  Allowed: Hot or cold whole-grain cereals.  Avoid: Cereals containing coconut, hydrogenated vegetable fat, or animal fat. Potatoes / Pasta / Rice  Allowed: All kinds of potatoes, rice, and pasta (such as macaroni, spaghetti, and noodles).  Avoid: Pasta or rice prepared with cream sauce or high-fat cheese. Chow mein noodles, [place] fries. Vegetables  Allowed: All vegetables and vegetable juices.  Avoid: Fried vegetables. Vegetables in cream, butter, or high-fat cheese sauces. Limit coconut. Fruit in cream or custard. Protein  Allowed: Limit your intake of meat, seafood, and poultry to no more than 6 oz (cooked weight) per day. All lean, well-trimmed beef, veal, pork, and lamb. All chicken and [place] without skin. All fish and shellfish. Wild game: wild duck, rabbit, pheasant, and venison. Egg whites or low-cholesterol egg substitutes may be used as desired. Meatless dishes: recipes with dried beans, peas, lentils, and tofu (soybean curd). Seeds and nuts: all seeds and most nuts.  Avoid: Prime grade and other heavily marbled and fatty meats, such as short  ribs, spare ribs, rib eye roast or steak, frankfurters, sausage, bacon, and high-fat luncheon meats, mutton. Caviar. Commercially fried fish. Domestic duck, goose, venison sausage. Organ meats: liver, gizzard, heart, chitterlings, brains, kidney, sweetbreads. Dairy  Allowed: Low-fat cheeses: nonfat or low-fat cottage cheese (1% or 2% fat), cheeses made with part skim milk, such as mozzarella, farmers, string, or ricotta. (Cheeses should be labeled no more than 2 to 6 grams fat per oz.). Skim (or 1%) milk: liquid, powdered, or evaporated. Buttermilk made with low-fat milk. Drinks made with skim or low-fat milk or cocoa. Chocolate milk or cocoa made with skim or low-fat (1%) milk. Nonfat or low-fat yogurt.  Avoid: Whole milk cheeses, including colby, cheddar, [place], Monterey Jack, [place], [place], [place], American, Swiss, and blue. Creamed cottage cheese, cream cheese. Whole milk and whole milk products, including buttermilk or yogurt made from whole milk, drinks made from whole milk. Condensed milk, evaporated whole milk, and 2% milk. Soups and Combination Foods  Allowed: Low-fat low-sodium soups: broth, dehydrated soups, homemade broth, soups with the fat removed, homemade cream soups made with skim or low-fat milk. Low-fat spaghetti, lasagna, chili, and Spanish rice if low-fat ingredients and low-fat cooking techniques are used.  Avoid: Cream soups made with whole milk, cream, or high-fat cheese. All  other soups. Desserts and Sweets  Allowed: Sherbet, fruit ices, gelatins, meringues, and angel food cake. Homemade desserts with recommended fats, oils, and milk products. Jam, jelly, honey, marmalade, sugars, and syrups. Pure sugar candy, such as gum drops, hard candy, jelly beans, marshmallows, mints, and small amounts of dark chocolate.  Avoid: Commercially prepared cakes, pies, cookies, frosting, pudding, or mixes for these products. Desserts containing whole milk products, chocolate, coconut,  lard, palm oil, or palm kernel oil. Ice cream or ice cream drinks. Candy that contains chocolate, coconut, butter, hydrogenated fat, or unknown ingredients. Buttered syrups. Fats and Oils  Allowed: Vegetable oils: safflower, sunflower, corn, soybean, cottonseed, sesame, canola, olive, or peanut. Non-hydrogenated margarines. Salad dressing or mayonnaise: homemade or commercial, made with a recommended oil. Low or nonfat salad dressing or mayonnaise.  Limit added fats and oils to 6 to 8 tsp per day (includes fats used in cooking, baking, salads, and spreads on bread). Remember to count the "hidden fats" in foods.  Avoid: Solid fats and shortenings: butter, lard, salt pork, bacon drippings. Gravy containing meat fat, shortening, or suet. Cocoa butter, coconut. Coconut oil, palm oil, palm kernel oil, or hydrogenated oils: these ingredients are often used in bakery products, nondairy creamers, whipped toppings, candy, and commercially fried foods. Read labels carefully. Salad dressings made of unknown oils, sour cream, or cheese, such as blue cheese and Roquefort. Cream, all kinds: half-and-half, light, heavy, or whipping. Sour cream or cream cheese (even if "light" or low-fat). Nondairy cream substitutes: coffee creamers and sour cream substitutes made with palm, palm kernel, hydrogenated oils, or coconut oil. Beverages  Allowed: Coffee (regular or decaffeinated), tea. Diet carbonated beverages, mineral water. Alcohol: Check with your caregiver. Moderation is recommended.  Avoid: Whole milk, regular sodas, and juice drinks with added sugar. Condiments  Allowed: All seasonings and condiments. Cocoa powder. "Cream" sauces made with recommended ingredients.  Avoid: Carob powder made with hydrogenated fats. SAMPLE MENU Breakfast   cup orange juice   cup oatmeal  1 slice toast  1 tsp margarine  1 cup skim milk Lunch  [place] sandwich with 2 oz [place], 2 slices bread  Lettuce and tomato  slices  Fresh fruit  Carrot sticks  Coffee or tea Snack  Fresh fruit or low-fat crackers Dinner  3 oz lean ground beef  1 baked potato  1 tsp margarine   cup asparagus  Lettuce salad  1 tbs non-creamy dressing   cup peach slices  1 cup skim milk Document Released: 04/06/2008 Document Revised: 12/28/2011 Document Reviewed: 08/28/2013 [employer] Patient Information 2015 [place], [place]. This information is not intended to replace advice given to you by your health care provider. Make sure you discuss any questions you have with your health care provider. Health Maintenance A healthy lifestyle and preventative care can promote health and wellness.  Maintain regular health, dental, and eye exams.  Eat a healthy diet. Foods like vegetables, fruits, whole grains, low-fat dairy products, and lean protein foods contain the nutrients you need and are low in calories. Decrease your intake of foods high in solid fats, added sugars, and salt. Get information about a proper diet from your health care provider, if necessary.  Regular physical exercise is one of the most important things you can do for your health. Most adults should get at least 150 minutes of moderate-intensity exercise (any activity that increases your heart rate and causes you to sweat) each week. In addition, most adults need muscle-strengthening exercises on 2 or more days a  week.   Maintain a healthy weight. The body mass index (BMI) is a screening tool to identify possible weight problems. It provides an estimate of body fat based on height and weight. Your health care provider can find your BMI and can help you achieve or maintain a healthy weight. For males 20 years and older:  A BMI below 18.5 is considered underweight.  A BMI of 18.5 to 24.9 is normal.  A BMI of 25 to 29.9 is considered overweight.  A BMI of 30 and above is considered obese.  Maintain normal blood lipids and cholesterol by exercising and  minimizing your intake of saturated fat. Eat a balanced diet with plenty of fruits and vegetables. Blood tests for lipids and cholesterol should begin at age 50 and be repeated every 5 years. If your lipid or cholesterol levels are high, you are over age 22, or you are at high risk for heart disease, you may need your cholesterol levels checked more frequently.Ongoing high lipid and cholesterol levels should be treated with medicines if diet and exercise are not working.  If you smoke, find out from your health care provider how to quit. If you do not use tobacco, do not start.  Lung cancer screening is recommended for adults aged 22-80 years who are at high risk for developing lung cancer because of a history of smoking. A yearly low-dose CT scan of the lungs is recommended for people who have at least a 30-pack-year history of smoking and are current smokers or have quit within the past 15 years. A pack year of smoking is smoking an average of 1 pack of cigarettes a day for 1 year (for example, a 30-pack-year history of smoking could mean smoking 1 pack a day for 30 years or 2 packs a day for 15 years). Yearly screening should continue until the smoker has stopped smoking for at least 15 years. Yearly screening should be stopped for people who develop a health problem that would prevent them from having lung cancer treatment.  If you choose to drink alcohol, do not have more than 2 drinks per day. One drink is considered to be 12 oz (360 mL) of beer, 5 oz (150 mL) of wine, or 1.5 oz (45 mL) of liquor.  Avoid the use of street drugs. Do not share needles with anyone. Ask for help if you need support or instructions about stopping the use of drugs.  High blood pressure causes heart disease and increases the risk of stroke. Blood pressure should be checked at least every 1-2 years. Ongoing high blood pressure should be treated with medicines if weight loss and exercise are not effective.  If you are  80-45 years old, ask your health care provider if you should take aspirin to prevent heart disease.  Diabetes screening involves taking a blood sample to check your fasting blood sugar level. This should be done once every 3 years after age 59 if you are at a normal weight and without risk factors for diabetes. Testing should be considered at a younger age or be carried out more frequently if you are overweight and have at least 1 risk factor for diabetes.  Colorectal cancer can be detected and often prevented. Most routine colorectal cancer screening begins at the age of 66 and continues through age 25. However, your health care provider may recommend screening at an earlier age if you have risk factors for colon cancer. On a yearly basis, your health care provider may provide  home test kits to check for hidden blood in the stool. A small camera at the end of a tube may be used to directly examine the colon (sigmoidoscopy or colonoscopy) to detect the earliest forms of colorectal cancer. Talk to your health care provider about this at age 44 when routine screening begins. A direct exam of the colon should be repeated every 5-10 years through age 8, unless early forms of precancerous polyps or small growths are found.  People who are at an increased risk for hepatitis B should be screened for this virus. You are considered at high risk for hepatitis B if:  You were born in a country where hepatitis B occurs often. Talk with your health care provider about which countries are considered high risk.  Your parents were born in a high-risk country and you have not received a shot to protect against hepatitis B (hepatitis B vaccine).  You have HIV or AIDS.  You use needles to inject street drugs.  You live with, or have sex with, someone who has hepatitis B.  You are a man who has sex with other men (MSM).  You get hemodialysis treatment.  You take certain medicines for conditions like cancer, organ  transplantation, and autoimmune conditions.  Hepatitis C blood testing is recommended for all people born from [birth year] through 1967 and any individual with known risk factors for hepatitis C.  Healthy men should no longer receive prostate-specific antigen (PSA) blood tests as part of routine cancer screening. Talk to your health care provider about prostate cancer screening.  Testicular cancer screening is not recommended for adolescents or adult males who have no symptoms. Screening includes self-exam, a health care provider exam, and other screening tests. Consult with your health care provider about any symptoms you have or any concerns you have about testicular cancer.  Practice safe sex. Use condoms and avoid high-risk sexual practices to reduce the spread of sexually transmitted infections (STIs).  You should be screened for STIs, including gonorrhea and chlamydia if:  You are sexually active and are younger than 24 years.  You are older than 24 years, and your health care provider tells you that you are at risk for this type of infection.  Your sexual activity has changed since you were last screened, and you are at an increased risk for chlamydia or gonorrhea. Ask your health care provider if you are at risk.  If you are at risk of being infected with HIV, it is recommended that you take a prescription medicine daily to prevent HIV infection. This is called pre-exposure prophylaxis (PrEP). You are considered at risk if:  You are a man who has sex with other men (MSM).  You are a heterosexual man who is sexually active with multiple partners.  You take drugs by injection.  You are sexually active with a partner who has HIV.  Talk with your health care provider about whether you are at high risk of being infected with HIV. If you choose to begin PrEP, you should first be tested for HIV. You should then be tested every 3 months for as long as you are taking PrEP.  Use sunscreen. Apply  sunscreen liberally and repeatedly throughout the day. You should seek shade when your shadow is shorter than you. Protect yourself by wearing long sleeves, pants, a wide-brimmed hat, and sunglasses year round whenever you are outdoors.  Tell your health care provider of new moles or changes in moles, especially if there is a  change in shape or color. Also, tell your health care provider if a mole is larger than the size of a pencil eraser.  A one-time screening for abdominal aortic aneurysm (AAA) and surgical repair of large AAAs by ultrasound is recommended for men aged 59-75 years who are current or former smokers.  Stay current with your vaccines (immunizations). Document Released: 12/25/2007 Document Revised: 07/03/2013 Document Reviewed: 11/23/2010 [facility] Patient Information 2015 [place], [place]. This information is not intended to replace advice given to you by your health care provider. Make sure you discuss any questions you have with your health care provider.

## 2014-02-19 NOTE — Progress Notes (Signed)
Patient ID: [name], male   DOB: Nov 10, 1945, 68 y.o.   MRN: [id number]  Subjective:    Patient ID: [name], male    DOB: 02-19-1946, 68 y.o.   MRN: [id number]  HPI  68 -year-old patient who is seen today for a preventive health examination    CC: cpx - doing well.  History of Present Illness:    68 year old patient who is in today for a preventive health examination He is followed by cardiology and has a history of complete heart block status post pacemaker insertion;  history of coronary artery disease and aortic valve replacement. Surgery. He has done quite well. Denies any cardio pulmonary complaints. He remains on chronic Coumadin therapy and followed in  the Coumadin clinic. He is on statin therapy; this has recently been switched to atorvastatin  Allergies:  1) ! Lopressor (Metoprolol Tartrate)  2) Codeine Phosphate (Codeine Phosphate)   Past History:  Past Medical History:   HYPERLIPIDEMIA (ICD-272.4)  HYPERTENSION (ICD-401.9)  CORONARY ARTERY DISEASE (ICD-414.00)  CABG... 2005  Bental procedure... August,.... 2005...( aVR in aortic root conduit)  Aortic stenosis... treated surgically  PACEMAKER (ICD-V45..01).Marland KitchenMarland KitchenAugust, 2005... Dr. Lovena Le  Carotid artery disease minimal... Doppler... August 2 005  AV BLOCK, COMPLETE (ICD-426.0)  HEMORRHOIDS, INTERNAL (ICD-455.0)  ENCOUNTER FOR THERAPEUTIC DRUG MONITORING (ICD-V58.83)  SPECIAL SCREENING MALIGNANT NEOPLASM OF PROSTATE (ICD-V76.44)  DIVERTICULOSIS, COLON (ICD-562.10)  GOUT (ICD-274.9)   Past Surgical History:   Coronary artery bypass graft August 2005  Aortic valve repair August 2005  Permanent pacemaker - [employer] M[employer]. Lovena Le, M.D. - 02/25/2004 - complete heart block  Circumcision  Shoulder surgery  status post PCI with stenting December 2004  colonoscopy February 2008, upper panendoscopy, February 2008   Family History:   thefather at age 32 MI and also had renal cancer  mother's health unknown  one  brother history type 2 diabetes  Positive for hypertension, total two brothers and 3 sisters  died age 39 (one brother)   Social History:   Full Time  Married  Tobacco Use - Former. 1970  Alcohol Use - no   1. Risk factors, based on past  M,S,F history-  cardiovascular risk factors include hypertension and dyslipidemia. The patient is status post CABG  2.  Physical activities: No exercise restrictions although fairly sedentary. 3.  Depression/mood: No depression or mood disorder  4.  Hearing: No significant deficits  5.  ADL's: Independent in all aspects of daily living  6.  Fall risk: Low  7.  Home safety: No problems identified  8.  Height weight, and visual acuity; height and weight stable weight is 252. No visual complaints  9.  Counseling: More regular exercise heart healthy diet and weight loss all encouraged  10. Lab orders based on risk factors: Laboratory studies reviewed. Impaired glucose tolerance unchanged slight worsening of dyslipidemia. Last issues addressed  11. Referral : Followup cardiology. Continue monthly INR monitoring  12. Care plan: Monthly INR monitoring weight loss regular exercise all recommended  13. Cognitive assessment: Alert and oriented normal affect. No cognitive dysfunction  14.  Preventive services-followup colonoscopy in 3 years.  Serial carotid artery ultrasounds  15.  Provider.  Update- followup cardiology.  GI followup in 3 years     Review of Systems  Constitutional: Negative for fever, chills, appetite change and fatigue.  HENT: Negative for congestion, dental problem, ear pain, hearing loss, sore throat, tinnitus, trouble swallowing and voice change.   Eyes: Negative for pain, discharge and visual disturbance.  Respiratory: Negative for cough, chest tightness, wheezing and stridor.   Cardiovascular: Negative for chest pain, palpitations and leg swelling.  Gastrointestinal: Negative for nausea, vomiting, abdominal pain,  diarrhea, constipation, blood in stool and abdominal distention.  Genitourinary: Negative for urgency, hematuria, flank pain, discharge, difficulty urinating and genital sores.  Musculoskeletal: Negative for arthralgias, back pain, gait problem, joint swelling, myalgias and neck stiffness.  Skin: Negative for rash.  Neurological: Negative for dizziness, syncope, speech difficulty, weakness, numbness and headaches.  Hematological: Negative for adenopathy. Does not bruise/bleed easily.  Psychiatric/Behavioral: Negative for behavioral problems and dysphoric mood. The patient is not nervous/anxious.        Objective:   Physical Exam  Constitutional: He appears well-developed and well-nourished.  Weight 243  Blood pressure 130/90   HENT:  Head: Normocephalic and atraumatic.  Right Ear: External ear normal.  Left Ear: External ear normal.  Nose: Nose normal.  Mouth/Throat: Oropharynx is clear and moist.  Eyes: Conjunctivae and EOM are normal. Pupils are equal, round, and reactive to light. No scleral icterus.  Neck: Normal range of motion. Neck supple. No JVD present. No thyromegaly present.  Brief right carotid bruit  Cardiovascular: Normal rate, regular rhythm and intact distal pulses.  Exam reveals no gallop and no friction rub.   No murmur heard. Prosthetic heart sounds without significant murmur  Dorsalis pedis pulses full.  Posterior tibial pulses faint   Pulmonary/Chest: Effort normal and breath sounds normal. He exhibits no tenderness.  Abdominal: Soft. Bowel sounds are normal. He exhibits no distension and no mass. There is no tenderness.  Genitourinary: Rectum normal and penis normal. Guaiac negative stool.  Prostate +2 enlarged  Musculoskeletal: Normal range of motion. He exhibits no edema and no tenderness.  Lymphadenopathy:    He has no cervical adenopathy.  Neurological: He is alert. He has normal reflexes. No cranial nerve deficit. Coordination normal.  Skin: Skin is  warm and dry. No rash noted.  Psychiatric: He has a normal mood and affect. His behavior is normal.          Assessment & Plan:   Preventive health examination Coronary artery disease status post CABG Status post aortic valve repair on chronic Coumadin anticoagulation Status post AV block status post permanent pacemaker insertion  Obesity Hypertension Dyslipidemia  Lifestyle issues addressed.  We'll encourage weight loss and more vigorous exercise regimen

## 2014-02-19 NOTE — Progress Notes (Signed)
Pre visit review using our clinic review tool, if applicable. No additional management support is needed unless otherwise documented below in the visit note. 

## 2014-02-20 ENCOUNTER — Other Ambulatory Visit: Payer: Self-pay | Admitting: Internal Medicine

## 2014-03-05 ENCOUNTER — Other Ambulatory Visit: Payer: Self-pay | Admitting: Internal Medicine

## 2014-03-08 ENCOUNTER — Ambulatory Visit (INDEPENDENT_AMBULATORY_CARE_PROVIDER_SITE_OTHER): Payer: Commercial Managed Care - HMO

## 2014-03-08 DIAGNOSIS — I359 Nonrheumatic aortic valve disorder, unspecified: Secondary | ICD-10-CM | POA: Diagnosis not present

## 2014-03-08 DIAGNOSIS — Z954 Presence of other heart-valve replacement: Secondary | ICD-10-CM

## 2014-03-08 DIAGNOSIS — Z5181 Encounter for therapeutic drug level monitoring: Secondary | ICD-10-CM

## 2014-03-08 DIAGNOSIS — Z7901 Long term (current) use of anticoagulants: Secondary | ICD-10-CM | POA: Diagnosis not present

## 2014-03-08 LAB — POCT INR: INR: 3.3

## 2014-03-25 DIAGNOSIS — I442 Atrioventricular block, complete: Secondary | ICD-10-CM

## 2014-04-02 ENCOUNTER — Other Ambulatory Visit: Payer: Self-pay | Admitting: Cardiology

## 2014-04-05 ENCOUNTER — Ambulatory Visit (INDEPENDENT_AMBULATORY_CARE_PROVIDER_SITE_OTHER): Payer: Commercial Managed Care - HMO

## 2014-04-05 ENCOUNTER — Ambulatory Visit: Payer: Commercial Managed Care - HMO

## 2014-04-05 DIAGNOSIS — Z954 Presence of other heart-valve replacement: Secondary | ICD-10-CM

## 2014-04-05 DIAGNOSIS — I359 Nonrheumatic aortic valve disorder, unspecified: Secondary | ICD-10-CM

## 2014-04-05 DIAGNOSIS — Z7901 Long term (current) use of anticoagulants: Secondary | ICD-10-CM

## 2014-04-05 DIAGNOSIS — Z5181 Encounter for therapeutic drug level monitoring: Secondary | ICD-10-CM

## 2014-04-05 LAB — POCT INR: INR: 2.9

## 2014-04-15 ENCOUNTER — Ambulatory Visit (HOSPITAL_COMMUNITY): Payer: Medicare HMO | Attending: Internal Medicine | Admitting: *Deleted

## 2014-04-15 DIAGNOSIS — I739 Peripheral vascular disease, unspecified: Secondary | ICD-10-CM

## 2014-04-15 DIAGNOSIS — I779 Disorder of arteries and arterioles, unspecified: Secondary | ICD-10-CM

## 2014-04-15 DIAGNOSIS — Z87891 Personal history of nicotine dependence: Secondary | ICD-10-CM | POA: Diagnosis not present

## 2014-04-15 DIAGNOSIS — E785 Hyperlipidemia, unspecified: Secondary | ICD-10-CM | POA: Diagnosis not present

## 2014-04-15 DIAGNOSIS — I2510 Atherosclerotic heart disease of native coronary artery without angina pectoris: Secondary | ICD-10-CM | POA: Diagnosis not present

## 2014-04-15 DIAGNOSIS — I10 Essential (primary) hypertension: Secondary | ICD-10-CM | POA: Insufficient documentation

## 2014-04-15 DIAGNOSIS — I6523 Occlusion and stenosis of bilateral carotid arteries: Secondary | ICD-10-CM | POA: Diagnosis not present

## 2014-04-15 DIAGNOSIS — Z951 Presence of aortocoronary bypass graft: Secondary | ICD-10-CM | POA: Diagnosis not present

## 2014-04-15 NOTE — Progress Notes (Signed)
Carotid duplex complete 

## 2014-04-17 ENCOUNTER — Encounter: Payer: Self-pay | Admitting: Cardiology

## 2014-04-19 ENCOUNTER — Telehealth: Payer: Self-pay | Admitting: Cardiology

## 2014-04-19 NOTE — Telephone Encounter (Signed)
**Note De-Identified  Obfuscation** The pt has been given his Carotid Doppler results and he verbalized understanding.

## 2014-04-19 NOTE — Telephone Encounter (Signed)
Follow Up ° °Pt returned call/sr  °

## 2014-04-19 NOTE — Telephone Encounter (Signed)
ERROR//SR

## 2014-04-22 ENCOUNTER — Encounter: Payer: Medicare HMO | Admitting: Internal Medicine

## 2014-04-29 ENCOUNTER — Telehealth: Payer: Self-pay | Admitting: Internal Medicine

## 2014-04-29 NOTE — Telephone Encounter (Signed)
Flu shot documented

## 2014-04-29 NOTE — Telephone Encounter (Signed)
Pt had flu shot on 9/25 and it has not been entered into system yet. Can you put this in? Pt keps getting calls from [facility] to get [facility] inj.

## 2014-05-03 ENCOUNTER — Ambulatory Visit (INDEPENDENT_AMBULATORY_CARE_PROVIDER_SITE_OTHER): Payer: Commercial Managed Care - HMO | Admitting: *Deleted

## 2014-05-03 DIAGNOSIS — Z952 Presence of prosthetic heart valve: Secondary | ICD-10-CM

## 2014-05-03 DIAGNOSIS — Z7901 Long term (current) use of anticoagulants: Secondary | ICD-10-CM

## 2014-05-03 DIAGNOSIS — Z954 Presence of other heart-valve replacement: Secondary | ICD-10-CM

## 2014-05-03 DIAGNOSIS — Z5181 Encounter for therapeutic drug level monitoring: Secondary | ICD-10-CM

## 2014-05-03 DIAGNOSIS — I359 Nonrheumatic aortic valve disorder, unspecified: Secondary | ICD-10-CM

## 2014-05-03 LAB — POCT INR: INR: 3

## 2014-05-10 ENCOUNTER — Telehealth: Payer: Self-pay | Admitting: Internal Medicine

## 2014-05-10 ENCOUNTER — Other Ambulatory Visit: Payer: Self-pay | Admitting: *Deleted

## 2014-05-10 MED ORDER — PREDNISONE 10 MG PO TABS: 10.0000 mg | ORAL_TABLET | Freq: Every day | ORAL | Status: DC

## 2014-05-10 MED ORDER — PREDNISONE 10 MG PO TABS: 10.0000 mg | ORAL_TABLET | Freq: Two times a day (BID) | ORAL | Status: DC

## 2014-05-10 NOTE — Telephone Encounter (Signed)
[name] wife Jeanett Schlein, notified Rx for Prednisone sent to pharmacy.

## 2014-05-10 NOTE — Telephone Encounter (Signed)
Please see message and advise 

## 2014-05-10 NOTE — Telephone Encounter (Signed)
Prednisone 10 mg #14 one twice a day

## 2014-05-10 NOTE — Telephone Encounter (Signed)
Pt states his right hand is swollen w/ arthritis. Been like this for one week. Pt would like a round of prednisone. [name] wife states dr Raliegh Ip did this for him before when the same thing happened and it helped. [facility]

## 2014-05-31 ENCOUNTER — Ambulatory Visit (INDEPENDENT_AMBULATORY_CARE_PROVIDER_SITE_OTHER): Payer: Commercial Managed Care - HMO

## 2014-05-31 DIAGNOSIS — Z5181 Encounter for therapeutic drug level monitoring: Secondary | ICD-10-CM

## 2014-05-31 DIAGNOSIS — I359 Nonrheumatic aortic valve disorder, unspecified: Secondary | ICD-10-CM

## 2014-05-31 DIAGNOSIS — Z952 Presence of prosthetic heart valve: Secondary | ICD-10-CM

## 2014-05-31 DIAGNOSIS — Z7901 Long term (current) use of anticoagulants: Secondary | ICD-10-CM

## 2014-05-31 DIAGNOSIS — Z954 Presence of other heart-valve replacement: Secondary | ICD-10-CM

## 2014-05-31 LAB — POCT INR: INR: 2.5

## 2014-06-24 DIAGNOSIS — I442 Atrioventricular block, complete: Secondary | ICD-10-CM

## 2014-06-28 ENCOUNTER — Ambulatory Visit (INDEPENDENT_AMBULATORY_CARE_PROVIDER_SITE_OTHER): Payer: Commercial Managed Care - HMO

## 2014-06-28 DIAGNOSIS — I359 Nonrheumatic aortic valve disorder, unspecified: Secondary | ICD-10-CM

## 2014-06-28 DIAGNOSIS — Z952 Presence of prosthetic heart valve: Secondary | ICD-10-CM

## 2014-06-28 DIAGNOSIS — Z5181 Encounter for therapeutic drug level monitoring: Secondary | ICD-10-CM

## 2014-06-28 DIAGNOSIS — Z954 Presence of other heart-valve replacement: Secondary | ICD-10-CM

## 2014-06-28 DIAGNOSIS — Z7901 Long term (current) use of anticoagulants: Secondary | ICD-10-CM

## 2014-06-28 LAB — POCT INR: INR: 3.3

## 2014-07-11 ENCOUNTER — Other Ambulatory Visit: Payer: Self-pay | Admitting: Internal Medicine

## 2014-07-16 ENCOUNTER — Telehealth: Payer: Self-pay | Admitting: Internal Medicine

## 2014-07-16 MED ORDER — RAMIPRIL 10 MG PO CAPS: 10.0000 mg | ORAL_CAPSULE | Freq: Every day | ORAL | Status: DC

## 2014-07-16 NOTE — Telephone Encounter (Signed)
Pt notified Rx sent to [facility].

## 2014-07-16 NOTE — Telephone Encounter (Signed)
[name] wife called bc [facility] called them bc they have not received rx ramipril (ALTACE) 10 MG capsule. Wife would like you to resend.

## 2014-07-18 ENCOUNTER — Other Ambulatory Visit: Payer: Self-pay | Admitting: Internal Medicine

## 2014-07-22 ENCOUNTER — Encounter (HOSPITAL_COMMUNITY): Payer: Self-pay | Admitting: Emergency Medicine

## 2014-07-22 ENCOUNTER — Emergency Department (HOSPITAL_COMMUNITY): Payer: Commercial Managed Care - HMO

## 2014-07-22 ENCOUNTER — Telehealth: Payer: Self-pay | Admitting: Cardiology

## 2014-07-22 ENCOUNTER — Emergency Department (HOSPITAL_COMMUNITY)
Admission: EM | Admit: 2014-07-22 | Discharge: 2014-07-22 | Disposition: A | Discharge status: Home / Self Care | Payer: Commercial Managed Care - HMO | Attending: Emergency Medicine | Admitting: Emergency Medicine

## 2014-07-22 DIAGNOSIS — Z87891 Personal history of nicotine dependence: Secondary | ICD-10-CM | POA: Diagnosis not present

## 2014-07-22 DIAGNOSIS — Z7901 Long term (current) use of anticoagulants: Secondary | ICD-10-CM | POA: Diagnosis not present

## 2014-07-22 DIAGNOSIS — K648 Other hemorrhoids: Secondary | ICD-10-CM | POA: Diagnosis not present

## 2014-07-22 DIAGNOSIS — Z7952 Long term (current) use of systemic steroids: Secondary | ICD-10-CM | POA: Insufficient documentation

## 2014-07-22 DIAGNOSIS — J45901 Unspecified asthma with (acute) exacerbation: Secondary | ICD-10-CM | POA: Insufficient documentation

## 2014-07-22 DIAGNOSIS — Z79899 Other long term (current) drug therapy: Secondary | ICD-10-CM | POA: Diagnosis not present

## 2014-07-22 DIAGNOSIS — I2510 Atherosclerotic heart disease of native coronary artery without angina pectoris: Secondary | ICD-10-CM | POA: Diagnosis not present

## 2014-07-22 DIAGNOSIS — E785 Hyperlipidemia, unspecified: Secondary | ICD-10-CM | POA: Insufficient documentation

## 2014-07-22 DIAGNOSIS — J159 Unspecified bacterial pneumonia: Secondary | ICD-10-CM | POA: Diagnosis not present

## 2014-07-22 DIAGNOSIS — K219 Gastro-esophageal reflux disease without esophagitis: Secondary | ICD-10-CM | POA: Diagnosis not present

## 2014-07-22 DIAGNOSIS — Z7982 Long term (current) use of aspirin: Secondary | ICD-10-CM | POA: Insufficient documentation

## 2014-07-22 DIAGNOSIS — J189 Pneumonia, unspecified organism: Secondary | ICD-10-CM | POA: Diagnosis not present

## 2014-07-22 DIAGNOSIS — M1990 Unspecified osteoarthritis, unspecified site: Secondary | ICD-10-CM | POA: Diagnosis not present

## 2014-07-22 DIAGNOSIS — Z9889 Other specified postprocedural states: Secondary | ICD-10-CM | POA: Insufficient documentation

## 2014-07-22 DIAGNOSIS — I10 Essential (primary) hypertension: Secondary | ICD-10-CM | POA: Insufficient documentation

## 2014-07-22 DIAGNOSIS — Z951 Presence of aortocoronary bypass graft: Secondary | ICD-10-CM | POA: Insufficient documentation

## 2014-07-22 DIAGNOSIS — R0602 Shortness of breath: Secondary | ICD-10-CM

## 2014-07-22 DIAGNOSIS — R079 Chest pain, unspecified: Secondary | ICD-10-CM | POA: Diagnosis present

## 2014-07-22 LAB — CBC WITH DIFFERENTIAL/PLATELET
BASOS PCT: 0 % (ref 0–1)
Basophils Absolute: 0 10*3/uL (ref 0.0–0.1)
EOS ABS: 0.1 10*3/uL (ref 0.0–0.7)
Eosinophils Relative: 1 % (ref 0–5)
HEMATOCRIT: 42.6 % (ref 39.0–52.0)
HEMOGLOBIN: 13.9 g/dL (ref 13.0–17.0)
LYMPHS ABS: 1.4 10*3/uL (ref 0.7–4.0)
Lymphocytes Relative: 19 % (ref 12–46)
MCH: 28.5 pg (ref 26.0–34.0)
MCHC: 32.6 g/dL (ref 30.0–36.0)
MCV: 87.3 fL (ref 78.0–100.0)
MONO ABS: 0.6 10*3/uL (ref 0.1–1.0)
MONOS PCT: 8 % (ref 3–12)
NEUTROS PCT: 72 % (ref 43–77)
Neutro Abs: 5.2 10*3/uL (ref 1.7–7.7)
Platelets: 167 10*3/uL (ref 150–400)
RBC: 4.88 MIL/uL (ref 4.22–5.81)
RDW: 15.1 % (ref 11.5–15.5)
WBC: 7.3 10*3/uL (ref 4.0–10.5)

## 2014-07-22 LAB — TROPONIN I

## 2014-07-22 LAB — I-STAT TROPONIN, ED: Troponin i, poc: 0 ng/mL (ref 0.00–0.08)

## 2014-07-22 LAB — BASIC METABOLIC PANEL
ANION GAP: 5 (ref 5–15)
BUN: 12 mg/dL (ref 6–23)
CO2: 27 mmol/L (ref 19–32)
Calcium: 9 mg/dL (ref 8.4–10.5)
Chloride: 104 mEq/L (ref 96–112)
Creatinine, Ser: 0.96 mg/dL (ref 0.50–1.35)
GFR, EST NON AFRICAN AMERICAN: 82 mL/min — AB (ref >90)
GLUCOSE: 187 mg/dL — AB (ref 70–99)
Potassium: 3.7 mmol/L (ref 3.5–5.1)
SODIUM: 136 mmol/L (ref 135–145)

## 2014-07-22 LAB — BRAIN NATRIURETIC PEPTIDE: B Natriuretic Peptide: 333 pg/mL — ABNORMAL HIGH (ref 0.0–100.0)

## 2014-07-22 MED ORDER — HYDROCORTISONE 2.5 % EX LOTN: TOPICAL_LOTION | Freq: Two times a day (BID) | CUTANEOUS | Status: DC

## 2014-07-22 MED ORDER — IPRATROPIUM-ALBUTEROL 0.5-2.5 (3) MG/3ML IN SOLN
3.0000 mL | Freq: Once | RESPIRATORY_TRACT | Status: AC
  Administered 2014-07-22: 3 mL via RESPIRATORY_TRACT
  Filled 2014-07-22: qty 3

## 2014-07-22 MED ORDER — LEVOFLOXACIN IN D5W 750 MG/150ML IV SOLN
750.0000 mg | Freq: Once | INTRAVENOUS | Status: AC
  Administered 2014-07-22: 750 mg via INTRAVENOUS
  Filled 2014-07-22: qty 150

## 2014-07-22 MED ORDER — NITROGLYCERIN 0.4 MG SL SUBL: 0.4000 mg | SUBLINGUAL_TABLET | SUBLINGUAL | Status: DC | PRN

## 2014-07-22 MED ORDER — LEVOFLOXACIN 750 MG PO TABS: 750.0000 mg | ORAL_TABLET | Freq: Every day | ORAL | Status: DC

## 2014-07-22 NOTE — Telephone Encounter (Signed)
Spoke with patient and he stated he felt like someone standing on chest When asked patient to rate on a scale of 1-10, 10 being the worst he rated at a 6 Patient have some increased shortness of breath  Advised patient needed to call 911 and go to ED Patient stated he could get himself there without calling 911, did encourage calling 911 Will forward to Dr Ron Parker

## 2014-07-22 NOTE — ED Notes (Signed)
PA at bedside,  

## 2014-07-22 NOTE — ED Notes (Signed)
Pt c/o CP that is pressure with SOB x 3 days; pt sts some cough

## 2014-07-22 NOTE — ED Provider Notes (Signed)
CSN: [id number]     Arrival date & time 07/22/14  1521 History   First MD Initiated Contact with Patient 07/22/14 1659     Chief Complaint  Patient presents with  . Chest Pain   HPI  Patient is a 69 year old male with past medical history of aortic stenosis with mechanical valve replacement, coronary artery disease, hyperlipidemia, hypertension, asthma, arthritis who presents emergency room for evaluation of chest pressure and shortness of breath. Patient states that for the past 2 days he has felt like something is been standing on his chest. He has tried inhalers at home with little relief. He takes a daily aspirin and also feels that that is not helping. He has a history of being exposed to bronchitis and pneumonia. He feels that he has had increased shortness of breath, wheezing, cough with white sputum production, a rash on his back and left arm, runny nose, nasal congestion. He denies ear pain, sore throat, numbness, leg swelling, PND, orthopnea. Patient states that he is followed by Dr. Ron Parker. He feels that this chest pressure and shortness of breath is similar to when he has had pneumonia in the past. Patient reports that he has missed 2 doses of metoprolol but hasn't started taking it again today. Patient states that the rash on his arms and his back are itchy. Nobody else in the house has a rash. He has not changed any of his personal products. He does admit to an allergy to poinsettias but has not been around them lately.  Past Medical History  Diagnosis Date  . Aortic stenosis 08/25/2010    a. [place] aortic root replacement with a St. Jude mechanical valve and Hemashield conduit 02/2004.  . AV BLOCK, COMPLETE     a. s/p St Jude dual chamber pacemaker 02/2004.  Marland Kitchen CAD (coronary artery disease)     a. s/p CABGx2 (LIMA-dLAD, SVG-Cx). b. Low risk nuc 12/2012 without ischemia, EF 46% mild apical hypokinesia (EF 55% inf HK by echo).  . DIVERTICULOSIS, COLON 10/17/2007  . GOUT 01/16/2007  .  HEMORRHOIDS, INTERNAL 11/01/2008  . HYPERLIPIDEMIA 01/16/2007  . HYPERTENSION 01/16/2007  . LEG EDEMA 11/27/2008  . Impaired glucose tolerance   . Carotid artery disease     a. 0-39% bilateral ICA stenosis, stable mild hard plaque in carotid bulbs. F/u 03/2014 recommended.  Marland Kitchen Special screening for malignant neoplasm of prostate   . Warfarin anticoagulation     AVR  . Ejection fraction     a. EF 55% with inf HK, mild MR by echo 12/2012.  . Asthma   . Arthritis   . GERD (gastroesophageal reflux disease)     hxof    Past Surgical History  Procedure Laterality Date  . Coronary artery bypass graft    . Aortic valve replacement    . Pacemaker insertion    . Shoulder surgery    . Cardiac catheterization  06/2003  . Circumcision    . Colonoscopy    . Hernia repair    . Coronary stents       prior to bypass   . Inguinal hernia repair Bilateral 02/07/2013    Procedure: LAPAROSCOPIC BILATERAL INGUINAL HERNIA REPAIR;  Surgeon: Imogene Burn. Georgette Dover, MD;  Location: [facility];  Service: General;  Laterality: Bilateral;  . Insertion of mesh Bilateral 02/07/2013    Procedure: INSERTION OF MESH;  Surgeon: Imogene Burn. Georgette Dover, MD;  Location: [facility];  Service: General;  Laterality: Bilateral;   Family History  Problem Relation Age of  Onset  . Heart attack Father   . Cancer Father     renal cancer  . Diabetes Brother   . Hypertension Mother   . Diabetes Mother    History  Substance Use Topics  . Smoking status: Former Smoker    Quit date: 07/12/1973  . Smokeless tobacco: Never Used  . Alcohol Use: No    Review of Systems  Constitutional: Negative for fever, chills and fatigue.  HENT: Positive for congestion, postnasal drip and rhinorrhea.   Respiratory: Positive for cough, shortness of breath and wheezing. Negative for chest tightness.   Cardiovascular: Positive for chest pain. Negative for palpitations and leg swelling.  Gastrointestinal: Negative for nausea, vomiting, abdominal pain, diarrhea and  constipation.  Genitourinary: Negative for urgency, frequency, hematuria and difficulty urinating.  Neurological: Negative for dizziness, numbness and headaches.  All other systems reviewed and are negative.     Allergies  Codeine phosphate and Metoprolol tartrate  Home Medications   Prior to Admission medications   Medication Sig Start Date End Date Taking? Authorizing Provider  acetaminophen (TYLENOL) 325 MG tablet Take 2 tablets (650 mg total) by mouth every 6 (six) hours as needed. 02/23/13   Erby Pian, NP  Ascorbic Acid (VITAMIN C) 500 MG tablet Take 1,000 mg by mouth daily.     Historical Provider, MD  aspirin 81 MG tablet Take 81 mg by mouth daily.      Historical Provider, MD  atorvastatin (LIPITOR) 40 MG tablet Take 40 mg by mouth daily.    Historical Provider, MD  calcium carbonate (OS-CAL) 600 MG TABS Take 600 mg by mouth 2 (two) times daily with a meal.    Historical Provider, MD  furosemide (LASIX) 40 MG tablet TAKE 1 TABLET TWICE DAILY 07/19/14   Marletta Lor, MD  Glucosamine-Chondroitin 250-200 MG CAPS Take 2 capsules by mouth daily.     Historical Provider, MD  hydrocortisone 2.5 % lotion Apply topically 2 (two) times daily. 07/22/14   Amrit Erck A Forcucci, PA-C  Ipratropium-Albuterol (COMBIVENT) 20-100 MCG/ACT AERS respimat Inhale 1 puff into the lungs every 6 (six) hours as needed for wheezing. 10/23/13   Marletta Lor, MD  levofloxacin (LEVAQUIN) 750 MG tablet Take 1 tablet (750 mg total) by mouth daily. X 7 days 07/22/14   Dellia Donnelly A Forcucci, PA-C  loratadine (CLARITIN) 10 MG tablet Take 10 mg by mouth daily.    Historical Provider, MD  Meclizine HCl (BONINE) 25 MG CHEW Chew 1 tablet by mouth daily.      Historical Provider, MD  metoprolol succinate (TOPROL-XL) 100 MG 24 hr tablet TAKE 1 TABLET EVERY MORNING.  TAKE WITH OR IMMEDIATELY FOLLOWING A MEAL 02/15/14   Marletta Lor, MD  Misc Natural Products [facility] ADVANCED) CAPS Take 1 capsule by mouth  every evening.     Historical Provider, MD  Multiple Vitamin (MULTIVITAMIN) tablet Take 1 tablet by mouth daily.      Historical Provider, MD  nitroGLYCERIN (NITROSTAT) 0.4 MG SL tablet Place 1 tablet (0.4 mg total) under the tongue every 5 (five) minutes as needed. For chest pain 07/18/13   Marletta Lor, MD  Omega-3 Fatty Acids (FISH OIL) 1000 MG CAPS Take 1,000 mg by mouth every morning.     Historical Provider, MD  omeprazole (PRILOSEC OTC) 20 MG tablet Take 1 tablet (20 mg total) by mouth daily. 02/07/14   Carlena Bjornstad, MD  potassium chloride (K-DUR,KLOR-CON) 10 MEQ tablet TAKE 1 TABLET TWICE DAILY 02/15/14  Marletta Lor, MD  predniSONE (DELTASONE) 10 MG tablet Take 1 tablet (10 mg total) by mouth 2 (two) times daily with a meal. 05/10/14   Marletta Lor, MD  ramipril (ALTACE) 10 MG capsule Take 1 capsule (10 mg total) by mouth daily. 07/16/14   Marletta Lor, MD  tadalafil (CIALIS) 10 MG tablet Take 40 mg by mouth daily as needed. 04/10/12   Marletta Lor, MD  traMADol (ULTRAM) 50 MG tablet Take 50 mg by mouth every 6 (six) hours as needed for pain.  07/26/12   Historical Provider, MD  vitamin B-12 (CYANOCOBALAMIN) 1000 MCG tablet Take 1,000 mcg by mouth daily.    Historical Provider, MD  warfarin (COUMADIN) 10 MG tablet Take as directed by coumadin clinic 04/03/14   Carlena Bjornstad, MD   BP 150/80 mmHg  Pulse 64  Temp(Src) 98.2 F (36.8 C) (Oral)  Resp 23  SpO2 96% Physical Exam  Constitutional: He is oriented to person, place, and time. He appears well-developed and well-nourished. No distress.  HENT:  Head: Normocephalic.  Mouth/Throat: Oropharynx is clear and moist. No oropharyngeal exudate.  Eyes: Conjunctivae and EOM are normal. Pupils are equal, round, and reactive to light. No scleral icterus.  Neck: Normal range of motion. Neck supple. No JVD present. No thyromegaly present.  Cardiovascular: Normal rate, regular rhythm, normal heart sounds and intact  distal pulses.  Exam reveals no gallop and no friction rub.   No murmur heard. Pulmonary/Chest: Effort normal. No respiratory distress. He has no wheezes. He has rales (Right lower lung field soft and crackles). He exhibits no tenderness.  Abdominal: Soft. Bowel sounds are normal. He exhibits no distension and no mass. There is no tenderness. There is no rebound and no guarding.  Musculoskeletal: Normal range of motion.  Lymphadenopathy:    He has no cervical adenopathy.  Neurological: He is alert and oriented to person, place, and time. He has normal strength. No cranial nerve deficit or sensory deficit.  Skin: Skin is warm and dry. He is not diaphoretic.  Psychiatric: He has a normal mood and affect. His behavior is normal. Judgment and thought content normal.  Nursing note and vitals reviewed.   ED Course  Procedures (including critical care time) Labs Review Labs Reviewed  BASIC METABOLIC PANEL - Abnormal; Notable for the following:    Glucose, Bld 187 (*)    GFR calc non Af Amer 82 (*)    All other components within normal limits  BRAIN NATRIURETIC PEPTIDE - Abnormal; Notable for the following:    B Natriuretic Peptide 333.0 (*)    All other components within normal limits  CBC WITH DIFFERENTIAL  TROPONIN I  PROTIME-INR  I-STAT TROPOININ, ED    Imaging Review Dg Chest 2 View  07/22/2014   CLINICAL DATA:  Acute shortness of breath and chest pain for 2 days.  EXAM: CHEST - 2 VIEW  COMPARISON:  02/09/2013  FINDINGS: Prior median sternotomy for aortic valve replacement. Right subclavian 2 lead pacer noted. Stable cardiomegaly without edema or pneumonia. Minor basilar atelectasis versus scarring. No focal collapse or consolidation. Negative for effusion or pneumothorax. Trachea midline. Degenerative changes of the spine diffusely.  IMPRESSION: Stable postoperative findings without superimposed acute process   Electronically Signed   By: Daryll Brod M.D.   On: 07/22/2014 17:25      EKG Interpretation   Date/Time:  Monday July 22 2014 15:25:27 EST Ventricular Rate:  87 PR Interval:    QRS Duration:  190 QT Interval:  450 QTC Calculation: 541 R Axis:   -15 Text Interpretation:  Electronic ventricular pacemaker No significant  change since last tracing Confirmed by YAO  MD, DAVID ([id number]) on 07/22/2014  4:58:19 PM      MDM   Final diagnoses:  Shortness of breath  CAP (community acquired pneumonia)   Patient is a 69 year old male who presents emergency room for evaluation rash and chest pressure and shortness of breath. Physical exam reveals maculopapular rash, left arm and back. Lungs reveal some crackles in the right lower lobe. Patient clinically seems to have possible pneumonia. Chest x-ray is negative. EKG is unchanged. I-STAT troponin is negative. BMP is unremarkable. CBC is unremarkable. Troponin is pending. Suspect that this might likely be clinical pneumonia. We'll give a dose of Levaquin here and a breathing treatment. It doesn't troponin is negative patient be discharged home patient return for ACS symptoms. We'll also discharge home with 2% hydrocortisone cream for rash. Patient to take his antibiotics until they're always gone. Patient follow-up with his PCP as needed. Patient to return for worsening shortness of breath, chest pain, or any other concerning symptoms. He states understanding and agreement at this time. Delta troponin is negative. I do not feel that this is ACS at this time. I believe that this may be more of a pulmonary cause. Patient seen by and discussed with Dr. Darl Householder who agrees with the above workup and plan. Patient stable for discharge.  Cherylann Parr, PA-C 07/22/14 [place] Yao, MD 07/24/14 [phone]

## 2014-07-22 NOTE — ED Notes (Signed)
Patient transported to X-ray 

## 2014-07-22 NOTE — Discharge Instructions (Signed)

## 2014-07-22 NOTE — Telephone Encounter (Signed)
Pt c/o of Chest Pain: (Chest Pressure)  1. Are you having CP right now? Yes 2. Are you experiencing any other symptoms (ex. SOB, nausea, vomiting, sweating)? No, he just reports he has felt bad. Bp is 153/86 3. How long have you been experiencing CP? Within 20 minutes.. persistent for the last 3-4 days  4. Is your CP continuous or coming and going? Continuous  5. Have you taken Nitroglycerin? No    Comment: Pt wife called reports the pt is stating that he feels as if someone is standing on his chest. Requests a call back to determine if they should go to the hospital. Please call

## 2014-07-22 NOTE — ED Notes (Signed)
Pt states he still has a chest pressure, but states, " I don't need a Nitro". Advised pt what the Nitro is for still refused.

## 2014-07-26 ENCOUNTER — Ambulatory Visit (INDEPENDENT_AMBULATORY_CARE_PROVIDER_SITE_OTHER): Payer: Commercial Managed Care - HMO

## 2014-07-26 DIAGNOSIS — Z954 Presence of other heart-valve replacement: Secondary | ICD-10-CM

## 2014-07-26 DIAGNOSIS — Z5181 Encounter for therapeutic drug level monitoring: Secondary | ICD-10-CM

## 2014-07-26 DIAGNOSIS — I359 Nonrheumatic aortic valve disorder, unspecified: Secondary | ICD-10-CM

## 2014-07-26 DIAGNOSIS — Z7901 Long term (current) use of anticoagulants: Secondary | ICD-10-CM

## 2014-07-26 DIAGNOSIS — Z952 Presence of prosthetic heart valve: Secondary | ICD-10-CM

## 2014-07-26 LAB — POCT INR: INR: 2.6

## 2014-08-02 ENCOUNTER — Other Ambulatory Visit: Payer: Self-pay | Admitting: Internal Medicine

## 2014-08-06 NOTE — Telephone Encounter (Signed)
[facility] [place] [facility] - Client [place] [facility] Patient Name: [name] DOB: May 01, 1946 Initial Comment Caller states her husband has a rash. Upper tosos. Nurse Assessment Nurse: Donalynn Furlong, RN, Myna Hidalgo Date/Time Eilene Ghazi Time): 08/06/2014 12:58:51 PM Confirm and document reason for call. If symptomatic, describe symptoms. ---Caller states her husband has a rash. Upper torso. Discharged from 07/22/14 for PNA, after being treated with antibiotics Has the patient traveled out of the country within the last 30 days? ---No Does the patient require triage? ---Yes Related visit to physician within the last 2 weeks? ---Yes Does the PT have any chronic conditions? (i.e. diabetes, asthma, etc.) ---Yes List chronic conditions. ---blood pressure, "heart condition"/takes thinners, asthma, takes Lipitor Guidelines Guideline Title Affirmed Question Affirmed Notes Rash or Redness - Localized Mild localized rash (all triage questions negative) Final Disposition [place], RN, Myna Hidalgo

## 2014-08-06 NOTE — Telephone Encounter (Signed)
Noted  

## 2014-08-08 ENCOUNTER — Encounter: Payer: Self-pay | Admitting: Internal Medicine

## 2014-08-08 ENCOUNTER — Telehealth: Payer: Self-pay | Admitting: Internal Medicine

## 2014-08-08 ENCOUNTER — Ambulatory Visit (INDEPENDENT_AMBULATORY_CARE_PROVIDER_SITE_OTHER): Payer: Commercial Managed Care - HMO | Admitting: Internal Medicine

## 2014-08-08 VITALS — BP 120/80 | HR 71 | Temp 98.1°F | Resp 20 | Ht 67.0 in | Wt 247.0 lb

## 2014-08-08 DIAGNOSIS — J209 Acute bronchitis, unspecified: Secondary | ICD-10-CM

## 2014-08-08 DIAGNOSIS — R05 Cough: Secondary | ICD-10-CM | POA: Diagnosis not present

## 2014-08-08 DIAGNOSIS — R059 Cough, unspecified: Secondary | ICD-10-CM

## 2014-08-08 MED ORDER — BENZONATATE 200 MG PO CAPS: 200.0000 mg | ORAL_CAPSULE | Freq: Two times a day (BID) | ORAL | Status: DC | PRN

## 2014-08-08 MED ORDER — PREDNISONE 20 MG PO TABS: 20.0000 mg | ORAL_TABLET | Freq: Every day | ORAL | Status: DC

## 2014-08-08 NOTE — Telephone Encounter (Signed)
Dr Raliegh Ip said he will have you look at his schedule to see when pt can come in to see him today. Wife said pt is still coughing and chest tightness. Pt recovering from pneumonia

## 2014-08-08 NOTE — Progress Notes (Signed)
Pre visit review using our clinic review tool, if applicable. No additional management support is needed unless otherwise documented below in the visit note. 

## 2014-08-08 NOTE — Patient Instructions (Signed)
Acute bronchitis symptoms are generally not helped by antibiotics.  Take over-the-counter expectorants and cough medications such as  Mucinex DM.  Call if there is no improvement in 5 to 7 days or if  you develop worsening cough, fever, or new symptoms, such as shortness of breath or chest pain.  HOME CARE INSTRUCTIONS  Get plenty of rest.  Drink enough fluids to keep your urine clear or pale yellow (unless you have a medical condition that requires fluid restriction). Increasing fluids may help thin your respiratory secretions (sputum) and reduce chest congestion, and it will prevent dehydration.  Take medicines only as directed by your health care provider.  If you were prescribed an antibiotic medicine, finish it all even if you start to feel better.  Avoid smoking and secondhand smoke. Exposure to cigarette smoke or irritating chemicals will make bronchitis worse. If you are a smoker, consider using nicotine gum or skin patches to help control withdrawal symptoms. Quitting smoking will help your lungs heal faster.  Reduce the chances of another bout of acute bronchitis by washing your hands frequently, avoiding people with cold symptoms, and trying not to touch your hands to your mouth, nose, or eyes.   

## 2014-08-08 NOTE — Progress Notes (Signed)
Subjective:    Patient ID: [name], male    DOB: June 29, 1946, 69 y.o.   MRN: [id number]  HPI  69 year old patient who is seen in the ED approximately 2 weeks ago.  He was treated with Levaquin and nebulizer treatments.  He's had persistent chest congestion and cough.  More recently has developed a rash involving his upper anterior chest and upper arms.  Cough is nonproductive.  Chest x-ray from January 11 reviewed and revealed no acute infiltrate.  He is status post aVR and is on Coumadin anticoagulation.  He has a codeine allergy  Past Medical History  Diagnosis Date  . Aortic stenosis 08/25/2010    a. Bentall aortic root replacement with a St. Jude mechanical valve and Hemashield conduit 02/2004.  . AV BLOCK, COMPLETE     a. s/p St Jude dual chamber pacemaker 02/2004.  Marland Kitchen CAD (coronary artery disease)     a. s/p CABGx2 (LIMA-dLAD, SVG-Cx). b. Low risk nuc 12/2012 without ischemia, EF 46% mild apical hypokinesia (EF 55% inf HK by echo).  . DIVERTICULOSIS, COLON 10/17/2007  . GOUT 01/16/2007  . HEMORRHOIDS, INTERNAL 11/01/2008  . HYPERLIPIDEMIA 01/16/2007  . HYPERTENSION 01/16/2007  . LEG EDEMA 11/27/2008  . Impaired glucose tolerance   . Carotid artery disease     a. 0-39% bilateral ICA stenosis, stable mild hard plaque in carotid bulbs. F/u 03/2014 recommended.  Marland Kitchen Special screening for malignant neoplasm of prostate   . Warfarin anticoagulation     AVR  . Ejection fraction     a. EF 55% with inf HK, mild MR by echo 12/2012.  . Asthma   . Arthritis   . GERD (gastroesophageal reflux disease)     hxof     History   Social History  . Marital Status: Married    Spouse Name: N/A    Number of Children: N/A  . Years of Education: N/A   Occupational History  . Not on file.   Social History Main Topics  . Smoking status: Former Smoker    Quit date: 07/12/1973  . Smokeless tobacco: Never Used  . Alcohol Use: No  . Drug Use: No  . Sexual Activity: No   Other Topics Concern  .  Not on file   Social History Narrative    Past Surgical History  Procedure Laterality Date  . Coronary artery bypass graft    . Aortic valve replacement    . Pacemaker insertion    . Shoulder surgery    . Cardiac catheterization  06/2003  . Circumcision    . Colonoscopy    . Hernia repair    . Coronary stents       prior to bypass   . Inguinal hernia repair Bilateral 02/07/2013    Procedure: LAPAROSCOPIC BILATERAL INGUINAL HERNIA REPAIR;  Surgeon: Imogene Burn. Georgette Dover, MD;  Location: [facility];  Service: General;  Laterality: Bilateral;  . Insertion of mesh Bilateral 02/07/2013    Procedure: INSERTION OF MESH;  Surgeon: Imogene Burn. Georgette Dover, MD;  Location: [facility];  Service: General;  Laterality: Bilateral;    Family History  Problem Relation Age of Onset  . Heart attack Father   . Cancer Father     renal cancer  . Diabetes Brother   . Hypertension Mother   . Diabetes Mother     Allergies  Allergen Reactions  . Codeine Phosphate Hives, Shortness Of Breath, Itching, Rash and Other (See Comments)    REACTION: difficulty breathing  . Metoprolol  Tartrate Other (See Comments)    REACTION: loss of vision    Current Outpatient Prescriptions on File Prior to Visit  Medication Sig Dispense Refill  . acetaminophen (TYLENOL) 325 MG tablet Take 2 tablets (650 mg total) by mouth every 6 (six) hours as needed.    . Ascorbic Acid (VITAMIN C) 500 MG tablet Take 1,000 mg by mouth daily.     Marland Kitchen aspirin 81 MG tablet Take 81 mg by mouth daily.      Marland Kitchen atorvastatin (LIPITOR) 40 MG tablet Take 40 mg by mouth daily.    . calcium carbonate (OS-CAL) 600 MG TABS Take 600 mg by mouth 2 (two) times daily with a meal.    . furosemide (LASIX) 40 MG tablet TAKE 1 TABLET TWICE DAILY 180 tablet 1  . Glucosamine-Chondroitin 250-200 MG CAPS Take 2 capsules by mouth daily.     Marland Kitchen guaiFENesin (MUCINEX) 600 MG 12 hr tablet Take 600 mg by mouth 2 (two) times daily as needed.    Marland Kitchen guaiFENesin-dextromethorphan (ROBITUSSIN  DM) 100-10 MG/5ML syrup Take 5 mLs by mouth every 4 (four) hours as needed for cough.    . hydrocortisone 2.5 % lotion Apply topically 2 (two) times daily. 59 mL 0  . loratadine (CLARITIN) 10 MG tablet Take 10 mg by mouth daily.    . Meclizine HCl (BONINE) 25 MG CHEW Chew 1 tablet by mouth daily.      . metoprolol succinate (TOPROL-XL) 100 MG 24 hr tablet TAKE 1 TABLET EVERY MORNING.  TAKE WITH OR IMMEDIATELY FOLLOWING A MEAL 90 tablet 1  . Misc Natural Products ([employer] ADVANCED) CAPS Take 1 capsule by mouth every morning.     . Multiple Vitamin (MULTIVITAMIN) tablet Take 1 tablet by mouth daily.      . nitroGLYCERIN (NITROSTAT) 0.4 MG SL tablet Place 1 tablet (0.4 mg total) under the tongue every 5 (five) minutes as needed. For chest pain 90 tablet 1  . Omega-3 Fatty Acids (FISH OIL) 1000 MG CAPS Take 1,000 mg by mouth every morning.     Marland Kitchen omeprazole (PRILOSEC OTC) 20 MG tablet Take 1 tablet (20 mg total) by mouth daily. (Patient taking differently: Take 20 mg by mouth as needed. )    . potassium chloride (K-DUR,KLOR-CON) 10 MEQ tablet TAKE 1 TABLET TWICE DAILY 180 tablet 1  . ramipril (ALTACE) 10 MG capsule Take 1 capsule (10 mg total) by mouth daily. 90 capsule 3  . tadalafil (CIALIS) 10 MG tablet Take 40 mg by mouth daily as needed.    . traMADol (ULTRAM) 50 MG tablet Take 50 mg by mouth every 6 (six) hours as needed for pain.     . vitamin B-12 (CYANOCOBALAMIN) 1000 MCG tablet Take 1,000 mcg by mouth daily.    Marland Kitchen warfarin (COUMADIN) 10 MG tablet Take as directed by coumadin clinic (Patient taking differently: Take 10 mg by mouth daily at 6 PM. Take as directed by coumadin clinic) 90 tablet 1  . [DISCONTINUED] potassium chloride (KLOR-CON 10) 10 MEQ CR tablet Take 1 tablet (10 mEq total) by mouth 2 (two) times daily. 180 tablet 6   No current facility-administered medications on file prior to visit.    BP 120/80 mmHg  Pulse 71  Temp(Src) 98.1 F (36.7 C) (Oral)  Resp 20  Ht 5\' 7"   (1.702 m)  Wt 247 lb (112.038 kg)  BMI 38.68 kg/m2  SpO2 94%     Review of Systems  Constitutional: Positive for fatigue. Negative for  fever, chills and appetite change.  HENT: Positive for postnasal drip, rhinorrhea and sinus pressure. Negative for congestion, dental problem, ear pain, hearing loss, sore throat, tinnitus, trouble swallowing and voice change.   Eyes: Negative for pain, discharge and visual disturbance.  Respiratory: Positive for cough. Negative for chest tightness, wheezing and stridor.   Cardiovascular: Negative for chest pain, palpitations and leg swelling.  Gastrointestinal: Negative for nausea, vomiting, abdominal pain, diarrhea, constipation, blood in stool and abdominal distention.  Genitourinary: Negative for urgency, hematuria, flank pain, discharge, difficulty urinating and genital sores.  Musculoskeletal: Negative for myalgias, back pain, joint swelling, arthralgias, gait problem and neck stiffness.  Skin: Negative for rash.  Neurological: Negative for dizziness, syncope, speech difficulty, weakness, numbness and headaches.  Hematological: Negative for adenopathy. Does not bruise/bleed easily.  Psychiatric/Behavioral: Negative for behavioral problems and dysphoric mood. The patient is not nervous/anxious.        Objective:   Physical Exam  Constitutional: He is oriented to person, place, and time. He appears well-developed.  HENT:  Head: Normocephalic.  Right Ear: External ear normal.  Left Ear: External ear normal.  Eyes: Conjunctivae and EOM are normal.  Neck: Normal range of motion.  Cardiovascular: Normal rate and normal heart sounds.   Pulmonary/Chest: Effort normal and breath sounds normal.  Abdominal: Bowel sounds are normal.  Musculoskeletal: Normal range of motion. He exhibits no edema or tenderness.  Neurological: He is alert and oriented to person, place, and time.  Skin:  Patchy erythema over the upper back and lateral aspects of both  upper arms  Psychiatric: He has a normal mood and affect. His behavior is normal.          Assessment & Plan:   Slowly resolving bronchitis.  Will treat with a burst of oral prednisone.  Continue expectorants and antitussives Hypertension, stable Rash.  Hopeful to respond to oral prednisone

## 2014-08-08 NOTE — Telephone Encounter (Signed)
Spoke to pt's wife, told her he can come in today at 1:30. Mrs Fleck said that will be fine. Kathy added pt to schedule.

## 2014-08-13 ENCOUNTER — Encounter (HOSPITAL_COMMUNITY): Payer: Self-pay | Admitting: Adult Health

## 2014-08-13 ENCOUNTER — Emergency Department (HOSPITAL_COMMUNITY)
Admission: EM | Admit: 2014-08-13 | Discharge: 2014-08-13 | Disposition: A | Discharge status: Home / Self Care | Payer: Commercial Managed Care - HMO | Attending: Emergency Medicine | Admitting: Emergency Medicine

## 2014-08-13 ENCOUNTER — Telehealth: Payer: Self-pay | Admitting: Internal Medicine

## 2014-08-13 ENCOUNTER — Emergency Department (HOSPITAL_COMMUNITY): Payer: Commercial Managed Care - HMO

## 2014-08-13 DIAGNOSIS — Z79899 Other long term (current) drug therapy: Secondary | ICD-10-CM | POA: Diagnosis not present

## 2014-08-13 DIAGNOSIS — R0602 Shortness of breath: Secondary | ICD-10-CM

## 2014-08-13 DIAGNOSIS — Z87891 Personal history of nicotine dependence: Secondary | ICD-10-CM | POA: Diagnosis not present

## 2014-08-13 DIAGNOSIS — M1990 Unspecified osteoarthritis, unspecified site: Secondary | ICD-10-CM | POA: Diagnosis not present

## 2014-08-13 DIAGNOSIS — Z7901 Long term (current) use of anticoagulants: Secondary | ICD-10-CM | POA: Insufficient documentation

## 2014-08-13 DIAGNOSIS — I10 Essential (primary) hypertension: Secondary | ICD-10-CM | POA: Insufficient documentation

## 2014-08-13 DIAGNOSIS — Z951 Presence of aortocoronary bypass graft: Secondary | ICD-10-CM | POA: Diagnosis not present

## 2014-08-13 DIAGNOSIS — E785 Hyperlipidemia, unspecified: Secondary | ICD-10-CM | POA: Insufficient documentation

## 2014-08-13 DIAGNOSIS — R05 Cough: Secondary | ICD-10-CM | POA: Diagnosis not present

## 2014-08-13 DIAGNOSIS — Z7982 Long term (current) use of aspirin: Secondary | ICD-10-CM | POA: Diagnosis not present

## 2014-08-13 DIAGNOSIS — K219 Gastro-esophageal reflux disease without esophagitis: Secondary | ICD-10-CM | POA: Insufficient documentation

## 2014-08-13 DIAGNOSIS — Z950 Presence of cardiac pacemaker: Secondary | ICD-10-CM | POA: Insufficient documentation

## 2014-08-13 DIAGNOSIS — R059 Cough, unspecified: Secondary | ICD-10-CM

## 2014-08-13 DIAGNOSIS — D72829 Elevated white blood cell count, unspecified: Secondary | ICD-10-CM | POA: Insufficient documentation

## 2014-08-13 DIAGNOSIS — I2510 Atherosclerotic heart disease of native coronary artery without angina pectoris: Secondary | ICD-10-CM | POA: Insufficient documentation

## 2014-08-13 DIAGNOSIS — Z9861 Coronary angioplasty status: Secondary | ICD-10-CM | POA: Insufficient documentation

## 2014-08-13 DIAGNOSIS — R7989 Other specified abnormal findings of blood chemistry: Secondary | ICD-10-CM | POA: Diagnosis not present

## 2014-08-13 DIAGNOSIS — Z8701 Personal history of pneumonia (recurrent): Secondary | ICD-10-CM | POA: Diagnosis not present

## 2014-08-13 DIAGNOSIS — Z9889 Other specified postprocedural states: Secondary | ICD-10-CM | POA: Insufficient documentation

## 2014-08-13 DIAGNOSIS — J45901 Unspecified asthma with (acute) exacerbation: Secondary | ICD-10-CM | POA: Insufficient documentation

## 2014-08-13 DIAGNOSIS — Z7952 Long term (current) use of systemic steroids: Secondary | ICD-10-CM | POA: Diagnosis not present

## 2014-08-13 LAB — BASIC METABOLIC PANEL
ANION GAP: 9 (ref 5–15)
BUN: 17 mg/dL (ref 6–23)
CHLORIDE: 101 mmol/L (ref 96–112)
CO2: 26 mmol/L (ref 19–32)
Calcium: 9.1 mg/dL (ref 8.4–10.5)
Creatinine, Ser: 1.04 mg/dL (ref 0.50–1.35)
GFR, EST AFRICAN AMERICAN: 82 mL/min — AB (ref >90)
GFR, EST NON AFRICAN AMERICAN: 71 mL/min — AB (ref >90)
GLUCOSE: 190 mg/dL — AB (ref 70–99)
POTASSIUM: 3.7 mmol/L (ref 3.5–5.1)
SODIUM: 136 mmol/L (ref 135–145)

## 2014-08-13 LAB — D-DIMER, QUANTITATIVE: D-Dimer, Quant: 0.42 ug/mL-FEU (ref 0.00–0.48)

## 2014-08-13 LAB — PROTIME-INR
INR: 2.16 — ABNORMAL HIGH (ref 0.00–1.49)
Prothrombin Time: 24.3 seconds — ABNORMAL HIGH (ref 11.6–15.2)

## 2014-08-13 LAB — CBC
HCT: 41.8 % (ref 39.0–52.0)
HEMOGLOBIN: 14.3 g/dL (ref 13.0–17.0)
MCH: 29.4 pg (ref 26.0–34.0)
MCHC: 34.2 g/dL (ref 30.0–36.0)
MCV: 86 fL (ref 78.0–100.0)
Platelets: 176 10*3/uL (ref 150–400)
RBC: 4.86 MIL/uL (ref 4.22–5.81)
RDW: 14.9 % (ref 11.5–15.5)
WBC: 11.2 10*3/uL — ABNORMAL HIGH (ref 4.0–10.5)

## 2014-08-13 LAB — I-STAT TROPONIN, ED
TROPONIN I, POC: 0 ng/mL (ref 0.00–0.08)
TROPONIN I, POC: 0.01 ng/mL (ref 0.00–0.08)

## 2014-08-13 LAB — BRAIN NATRIURETIC PEPTIDE: B Natriuretic Peptide: 120.4 pg/mL — ABNORMAL HIGH (ref 0.0–100.0)

## 2014-08-13 MED ORDER — IPRATROPIUM-ALBUTEROL 0.5-2.5 (3) MG/3ML IN SOLN
3.0000 mL | Freq: Once | RESPIRATORY_TRACT | Status: AC
  Administered 2014-08-13: 3 mL via RESPIRATORY_TRACT
  Filled 2014-08-13: qty 3

## 2014-08-13 NOTE — ED Notes (Signed)
Spoke with lab regarding adding on d-dimer, states test will be in process.

## 2014-08-13 NOTE — Telephone Encounter (Signed)
Pt currently admitted.

## 2014-08-13 NOTE — ED Notes (Signed)
Presents with SOB, chest tightness, dizziness and fatigue began two weeks ago when he was dx with PNA-today it all became worse, associated with a productive cough and diminished breath sounds. Sats are 93% RA. He is alert and oriented.

## 2014-08-13 NOTE — ED Provider Notes (Signed)
CSN: [id number]     Arrival date & time 08/13/14  1522 History   First MD Initiated Contact with Patient 08/13/14 1559     Chief Complaint  Patient presents with  . Shortness of Breath     (Consider location/radiation/quality/duration/timing/severity/associated sxs/prior Treatment) Patient is a 69 y.o. male presenting with shortness of breath. The history is provided by the patient. No language interpreter was used.  Shortness of Breath Severity:  Moderate Onset quality:  Gradual Duration:  2 weeks Timing:  Constant Progression:  Unchanged Chronicity:  New Context: not activity, not emotional upset, not known allergens and not URI   Relieved by:  Nothing Worsened by:  Coughing Ineffective treatments:  None tried Associated symptoms: cough and sputum production   Associated symptoms: no abdominal pain, no chest pain, no ear pain, no fever, no neck pain, no vomiting and no wheezing   Cough:    Cough characteristics:  Productive   Sputum characteristics:  White   Severity:  Moderate   Onset quality:  Gradual   Timing:  Constant   Progression:  Unchanged   Chronicity:  New   Past Medical History  Diagnosis Date  . Aortic stenosis 08/25/2010    a. Bentall aortic root replacement with a St. Jude mechanical valve and Hemashield conduit 02/2004.  . AV BLOCK, COMPLETE     a. s/p St Jude dual chamber pacemaker 02/2004.  Marland Kitchen CAD (coronary artery disease)     a. s/p CABGx2 (LIMA-dLAD, SVG-Cx). b. Low risk nuc 12/2012 without ischemia, EF 46% mild apical hypokinesia (EF 55% inf HK by echo).  . DIVERTICULOSIS, COLON 10/17/2007  . GOUT 01/16/2007  . HEMORRHOIDS, INTERNAL 11/01/2008  . HYPERLIPIDEMIA 01/16/2007  . HYPERTENSION 01/16/2007  . LEG EDEMA 11/27/2008  . Impaired glucose tolerance   . Carotid artery disease     a. 0-39% bilateral ICA stenosis, stable mild hard plaque in carotid bulbs. F/u 03/2014 recommended.  Marland Kitchen Special screening for malignant neoplasm of prostate   . Warfarin  anticoagulation     AVR  . Ejection fraction     a. EF 55% with inf HK, mild MR by echo 12/2012.  . Asthma   . Arthritis   . GERD (gastroesophageal reflux disease)     hxof    Past Surgical History  Procedure Laterality Date  . Coronary artery bypass graft    . Aortic valve replacement    . Pacemaker insertion    . Shoulder surgery    . Cardiac catheterization  06/2003  . Circumcision    . Colonoscopy    . Hernia repair    . Coronary stents       prior to bypass   . Inguinal hernia repair Bilateral 02/07/2013    Procedure: LAPAROSCOPIC BILATERAL INGUINAL HERNIA REPAIR;  Surgeon: Imogene Burn. Georgette Dover, MD;  Location: [facility];  Service: General;  Laterality: Bilateral;  . Insertion of mesh Bilateral 02/07/2013    Procedure: INSERTION OF MESH;  Surgeon: Imogene Burn. Georgette Dover, MD;  Location: [facility];  Service: General;  Laterality: Bilateral;   Family History  Problem Relation Age of Onset  . Heart attack Father   . Cancer Father     renal cancer  . Diabetes Brother   . Hypertension Mother   . Diabetes Mother    History  Substance Use Topics  . Smoking status: Former Smoker    Quit date: 07/12/1973  . Smokeless tobacco: Never Used  . Alcohol Use: No    Review of Systems  Constitutional: Negative for fever.  HENT: Negative for ear pain.   Respiratory: Positive for cough, sputum production and shortness of breath. Negative for chest tightness and wheezing.   Cardiovascular: Negative for chest pain.  Gastrointestinal: Negative for vomiting and abdominal pain.  Genitourinary: Negative for dysuria, urgency and frequency.  Musculoskeletal: Negative for back pain and neck pain.  Skin: Negative for color change.  Neurological: Negative for weakness and numbness.  All other systems reviewed and are negative.     Allergies  Codeine phosphate and Metoprolol tartrate  Home Medications   Prior to Admission medications   Medication Sig Start Date End Date Taking? Authorizing Provider   acetaminophen (TYLENOL) 325 MG tablet Take 2 tablets (650 mg total) by mouth every 6 (six) hours as needed. Patient taking differently: Take 650 mg by mouth every 6 (six) hours as needed for mild pain.  02/23/13  Yes Emina Riebock, NP  Ascorbic Acid (VITAMIN C) 500 MG tablet Take 1,000 mg by mouth daily.    Yes Historical Provider, MD  aspirin 81 MG tablet Take 81 mg by mouth daily.     Yes Historical Provider, MD  atorvastatin (LIPITOR) 40 MG tablet Take 40 mg by mouth every evening.    Yes Historical Provider, MD  benzonatate (TESSALON) 200 MG capsule Take 1 capsule (200 mg total) by mouth 2 (two) times daily as needed for cough. 08/08/14  Yes Marletta Lor, MD  calcium carbonate (OS-CAL) 600 MG TABS Take 600 mg by mouth 2 (two) times daily with a meal.   Yes Historical Provider, MD  furosemide (LASIX) 40 MG tablet TAKE 1 TABLET TWICE DAILY 07/19/14  Yes Marletta Lor, MD  Glucosamine-Chondroitin 250-200 MG CAPS Take 2 capsules by mouth daily.    Yes Historical Provider, MD  guaiFENesin (MUCINEX) 600 MG 12 hr tablet Take 600 mg by mouth 2 (two) times daily as needed for cough or to loosen phlegm.    Yes Historical Provider, MD  guaiFENesin-dextromethorphan (ROBITUSSIN DM) 100-10 MG/5ML syrup Take 5 mLs by mouth every 4 (four) hours as needed for cough.   Yes Historical Provider, MD  hydrocortisone 2.5 % lotion Apply topically 2 (two) times daily. 07/22/14  Yes Courtney A Forcucci, PA-C  loratadine (CLARITIN) 10 MG tablet Take 10 mg by mouth daily.   Yes Historical Provider, MD  Meclizine HCl (BONINE) 25 MG CHEW Chew 1 tablet by mouth daily.     Yes Historical Provider, MD  metoprolol succinate (TOPROL-XL) 100 MG 24 hr tablet TAKE 1 TABLET EVERY MORNING.  TAKE WITH OR IMMEDIATELY FOLLOWING A MEAL 08/04/14  Yes Marletta Lor, MD  Misc Natural Products [facility] ADVANCED) CAPS Take 2 capsules by mouth daily.    Yes Historical Provider, MD  Multiple Vitamin (MULTIVITAMIN) tablet Take 1  tablet by mouth daily.     Yes Historical Provider, MD  nitroGLYCERIN (NITROSTAT) 0.4 MG SL tablet Place 1 tablet (0.4 mg total) under the tongue every 5 (five) minutes as needed. For chest pain 07/18/13  Yes Marletta Lor, MD  Omega-3 Fatty Acids (FISH OIL) 1000 MG CAPS Take 1,000 mg by mouth 2 (two) times daily.    Yes Historical Provider, MD  omeprazole (PRILOSEC OTC) 20 MG tablet Take 1 tablet (20 mg total) by mouth daily. Patient taking differently: Take 20 mg by mouth as needed.  02/07/14  Yes Carlena Bjornstad, MD  potassium chloride (K-DUR,KLOR-CON) 10 MEQ tablet TAKE 1 TABLET TWICE DAILY 08/04/14  Yes Doretha Sou  Burnice Logan, MD  predniSONE (DELTASONE) 20 MG tablet Take 1 tablet (20 mg total) by mouth daily with breakfast. 08/08/14  Yes Marletta Lor, MD  ramipril (ALTACE) 10 MG capsule Take 1 capsule (10 mg total) by mouth daily. 07/16/14  Yes Marletta Lor, MD  tadalafil (CIALIS) 10 MG tablet Take 40 mg by mouth daily as needed for erectile dysfunction.  04/10/12  Yes Marletta Lor, MD  traMADol (ULTRAM) 50 MG tablet Take 50 mg by mouth every 6 (six) hours as needed for pain.  07/26/12  Yes Historical Provider, MD  vitamin B-12 (CYANOCOBALAMIN) 1000 MCG tablet Take 1,000 mcg by mouth daily.   Yes Historical Provider, MD  warfarin (COUMADIN) 10 MG tablet Take as directed by coumadin clinic Patient taking differently: Take 10 mg by mouth daily at 6 PM. Take as directed by coumadin clinic 04/03/14  Yes Carlena Bjornstad, MD   BP 124/62 mmHg  Pulse 67  Temp(Src) 98.7 F (37.1 C) (Oral)  Resp 18  SpO2 88% Physical Exam  Constitutional: He is oriented to person, place, and time. He appears well-developed and well-nourished. No distress.  HENT:  Head: Normocephalic and atraumatic.  Eyes: Pupils are equal, round, and reactive to light.  Neck: Normal range of motion.  Cardiovascular: Normal rate, regular rhythm and normal heart sounds.   Pulmonary/Chest: Effort normal and breath  sounds normal. No respiratory distress. He has no decreased breath sounds. He has no wheezes. He has no rhonchi. He has no rales.  Abdominal: Soft. He exhibits no distension. There is no tenderness. There is no rebound and no guarding.  Musculoskeletal: He exhibits no edema or tenderness.  Neurological: He is alert and oriented to person, place, and time. He exhibits normal muscle tone.  Skin: Skin is warm and dry.  Nursing note and vitals reviewed.   ED Course  Procedures (including critical care time) Labs Review Labs Reviewed  CBC - Abnormal; Notable for the following:    WBC 11.2 (*)    All other components within normal limits  BASIC METABOLIC PANEL - Abnormal; Notable for the following:    Glucose, Bld 190 (*)    GFR calc non Af Amer 71 (*)    GFR calc Af Amer 82 (*)    All other components within normal limits  BRAIN NATRIURETIC PEPTIDE - Abnormal; Notable for the following:    B Natriuretic Peptide 120.4 (*)    All other components within normal limits  PROTIME-INR - Abnormal; Notable for the following:    Prothrombin Time 24.3 (*)    INR 2.16 (*)    All other components within normal limits  D-DIMER, QUANTITATIVE  I-STAT TROPOININ, ED  I-STAT TROPOININ, ED    Imaging Review Dg Chest 2 View  08/13/2014   CLINICAL DATA:  Shortness of breath, chest tightness, dizziness and fatigue beginning 2 weeks ago, diagnosed with pneumonia, productive cough, diminished breath sounds, history coronary artery disease post CABG, arrhythmia post pacemaker, hypertension, hyperlipidemia, aortic stenosis post valvular surgery  EXAM: CHEST  2 VIEW  COMPARISON:  07/22/2014  FINDINGS: RIGHT subclavian sequential pacemaker leads project at RIGHT atrium and RIGHT ventricle.  Minimal enlargement of cardiac silhouette post AVR and CABG.  Partially calcified and tortuous thoracic aorta.  Pulmonary vascularity normal.  Lungs clear.  No pleural effusion or pneumothorax.  Prior resection of distal RIGHT  clavicle.  Bones demineralized.  IMPRESSION: Mild enlargement of cardiac silhouette post CABG, AVR and pacemaker.  No acute abnormalities.  Electronically Signed   By: Lavonia Dana M.D.   On: 08/13/2014 16:07     EKG Interpretation None      MDM   Final diagnoses:  Shortness of breath  Cough    Patient is a 69 year old Caucasian male with pertinent past medical history of coronary artery bypass graft, mechanical valve, and recent diagnosis of bronchitis and pneumonia who comes to the emergency department today with worsening shortness of breath this morning. Physical exam as above. With patient's history of mechanical valve this is concerning that a PE could be potentially contributing to his symptoms. Patient has expressed improvement with treatment for his bronchitis as a result I feel this is the most likely diagnosis.  Patient has a Wells of 1.5.  As a result a negative d-dimer should be sufficient to rule out PE. Initial workup included a CBC, BMP, BNP, PT/INR, d-dimer, troponin, chest x-ray, and EKG.  Chest x-ray demonstrated enlargement of cardiac silhouette but was otherwise unremarkable. No consolidations making pneumonia unlikely. There is no evidence of pneumothorax. Patient's CBC had white count of 11.2 otherwise unremarkable. BMP was unremarkable. BNP was 120 otherwise unremarkable. INR was 2.16 which is slightly subtherapeutic however patient has never had subtherapeutic INR before and has not yet taken his Coumadin today.  I do not feel he requires adjustment of his coumadin dosing at this time. D-dimer was negative at 0.42.  As a result I doubt a PE. Patient's history is atypical and non cardiac in nature.  I feel that an MI is unlikely however with his significant history will obtain delta troponin's. Delta troponins were negative as a result I do not feel patient requires further evaluation for MI at this time. Patient was treated with a DuoNeb in the emergency room with significant  improvement in his symptoms. I feel this is consistent with his history of bronchitis. The patient was instructed to continue his treatments with albuterol at home.  The patient expressed understanding. He was discharged in good condition. Labs reviewed by myself and considered in medical decision-making. Imaging was interpreted by radiology. Care was discussed with my attending Dr. Aline Brochure.     Katheren Shams, MD 08/14/14 1141  Pamella Pert, MD 08/14/14 [phone]

## 2014-08-13 NOTE — Discharge Instructions (Signed)
Cough, Adult  A cough is a reflex that helps clear your throat and airways. It can help heal the body or may be a reaction to an irritated airway. A cough may only last 2 or 3 weeks (acute) or may last more than 8 weeks (chronic).  CAUSES Acute cough:  Viral or bacterial infections. Chronic cough:  Infections.  Allergies.  Asthma.  Post-nasal drip.  Smoking.  Heartburn or acid reflux.  Some medicines.  Chronic lung problems (COPD).  Cancer. SYMPTOMS   Cough.  Fever.  Chest pain.  Increased breathing rate.  High-pitched whistling sound when breathing (wheezing).  Colored mucus that you cough up (sputum). TREATMENT   A bacterial cough may be treated with antibiotic medicine.  A viral cough must run its course and will not respond to antibiotics.  Your caregiver may recommend other treatments if you have a chronic cough. HOME CARE INSTRUCTIONS   Only take over-the-counter or prescription medicines for pain, discomfort, or fever as directed by your caregiver. Use cough suppressants only as directed by your caregiver.  Use a cold steam vaporizer or humidifier in your bedroom or home to help loosen secretions.  Sleep in a semi-upright position if your cough is worse at night.  Rest as needed.  Stop smoking if you smoke. SEEK IMMEDIATE MEDICAL CARE IF:   You have pus in your sputum.  Your cough starts to worsen.  You cannot control your cough with suppressants and are losing sleep.  You begin coughing up blood.  You have difficulty breathing.  You develop pain which is getting worse or is uncontrolled with medicine.  You have a fever. MAKE SURE YOU:   Understand these instructions.  Will watch your condition.  Will get help right away if you are not doing well or get worse. Document Released: 12/25/2010 Document Revised: 09/20/2011 Document Reviewed: 12/25/2010 [employer] Patient Information 2015 [employer]. This information is not intended  to replace advice given to you by your health care provider. Make sure you discuss any questions you have with your health care provider.  

## 2014-08-13 NOTE — Telephone Encounter (Signed)
Patient Name: [name]  DOB: 01/29/1946    Initial Comment Caller states husband has bronchitis he is complaining that he can not get enough air,   Nurse Assessment  Nurse: Leilani Merl, RN, Heather Date/Time (Eastern Time): 08/13/2014 2:44:36 PM  Confirm and document reason for call. If symptomatic, describe symptoms. ---Caller states husband has bronchitis that he was diagnosed with last week and given medication and today he is complaining that he can not get enough air, he is using his inhaler, but it is not helping.  Has the patient traveled out of the country within the last 30 days? ---Not Applicable  Does the patient require triage? ---Yes  Related visit to physician within the last 2 weeks? ---Yes  Does the PT have any chronic conditions? (i.e. diabetes, asthma, etc.) ---Yes  List chronic conditions. ---bronchitis, heart problems, artificial heart valve     Guidelines    Guideline Title Affirmed Question Affirmed Notes  Cough - Acute Productive Difficulty breathing    Final Disposition User   Go to ED Now [place], RN, [employer]

## 2014-08-13 NOTE — ED Notes (Signed)
Pt requesting breathing treatment, states his breathing feels tight and he has received them here in the ED before and they have helped, respirations even and nonlabored

## 2014-08-20 ENCOUNTER — Other Ambulatory Visit: Payer: Self-pay | Admitting: Cardiology

## 2014-08-22 ENCOUNTER — Ambulatory Visit: Payer: Commercial Managed Care - HMO | Admitting: Internal Medicine

## 2014-08-23 ENCOUNTER — Ambulatory Visit (INDEPENDENT_AMBULATORY_CARE_PROVIDER_SITE_OTHER): Payer: Commercial Managed Care - HMO | Admitting: *Deleted

## 2014-08-23 DIAGNOSIS — Z954 Presence of other heart-valve replacement: Secondary | ICD-10-CM | POA: Diagnosis not present

## 2014-08-23 DIAGNOSIS — I359 Nonrheumatic aortic valve disorder, unspecified: Secondary | ICD-10-CM | POA: Diagnosis not present

## 2014-08-23 DIAGNOSIS — Z952 Presence of prosthetic heart valve: Secondary | ICD-10-CM

## 2014-08-23 DIAGNOSIS — Z5181 Encounter for therapeutic drug level monitoring: Secondary | ICD-10-CM | POA: Diagnosis not present

## 2014-08-23 DIAGNOSIS — Z7901 Long term (current) use of anticoagulants: Secondary | ICD-10-CM

## 2014-08-23 LAB — POCT INR: INR: 2.9

## 2014-08-27 ENCOUNTER — Telehealth: Payer: Self-pay | Admitting: Internal Medicine

## 2014-08-27 DIAGNOSIS — R21 Rash and other nonspecific skin eruption: Secondary | ICD-10-CM

## 2014-08-27 NOTE — Telephone Encounter (Signed)
Okay to order Dermatology referral?

## 2014-08-27 NOTE — Telephone Encounter (Signed)
[name] wife notified referral for Dermatology done and someone will get in touch with him regarding an appt.

## 2014-08-27 NOTE — Telephone Encounter (Signed)
Pt was seen for a rash on both arms, sides, and now on legs and back and it itches. Had this since jan.  Pt was prescribed med by Dr Raliegh Ip , but is not any better.  Pt would ike referral to [facility] dermatology. Any doc ok [employer] ins. Phone: [phone].

## 2014-08-27 NOTE — Telephone Encounter (Signed)
ok 

## 2014-09-13 ENCOUNTER — Other Ambulatory Visit: Payer: Self-pay | Admitting: Dermatology

## 2014-09-13 DIAGNOSIS — D2111 Benign neoplasm of connective and other soft tissue of right upper limb, including shoulder: Secondary | ICD-10-CM | POA: Diagnosis not present

## 2014-09-13 DIAGNOSIS — D485 Neoplasm of uncertain behavior of skin: Secondary | ICD-10-CM | POA: Diagnosis not present

## 2014-09-13 DIAGNOSIS — L308 Other specified dermatitis: Secondary | ICD-10-CM | POA: Diagnosis not present

## 2014-09-13 DIAGNOSIS — L509 Urticaria, unspecified: Secondary | ICD-10-CM | POA: Diagnosis not present

## 2014-09-16 ENCOUNTER — Ambulatory Visit (INDEPENDENT_AMBULATORY_CARE_PROVIDER_SITE_OTHER): Payer: Commercial Managed Care - HMO | Admitting: Pharmacist

## 2014-09-16 DIAGNOSIS — I359 Nonrheumatic aortic valve disorder, unspecified: Secondary | ICD-10-CM | POA: Diagnosis not present

## 2014-09-16 DIAGNOSIS — Z952 Presence of prosthetic heart valve: Secondary | ICD-10-CM

## 2014-09-16 DIAGNOSIS — Z954 Presence of other heart-valve replacement: Secondary | ICD-10-CM | POA: Diagnosis not present

## 2014-09-16 DIAGNOSIS — Z7901 Long term (current) use of anticoagulants: Secondary | ICD-10-CM | POA: Diagnosis not present

## 2014-09-16 DIAGNOSIS — Z5181 Encounter for therapeutic drug level monitoring: Secondary | ICD-10-CM | POA: Diagnosis not present

## 2014-09-16 LAB — POCT INR: INR: 3.5

## 2014-09-19 ENCOUNTER — Ambulatory Visit (INDEPENDENT_AMBULATORY_CARE_PROVIDER_SITE_OTHER): Payer: Commercial Managed Care - HMO | Admitting: *Deleted

## 2014-09-19 DIAGNOSIS — I359 Nonrheumatic aortic valve disorder, unspecified: Secondary | ICD-10-CM | POA: Diagnosis not present

## 2014-09-19 DIAGNOSIS — Z7901 Long term (current) use of anticoagulants: Secondary | ICD-10-CM

## 2014-09-19 DIAGNOSIS — Z952 Presence of prosthetic heart valve: Secondary | ICD-10-CM

## 2014-09-19 DIAGNOSIS — Z5181 Encounter for therapeutic drug level monitoring: Secondary | ICD-10-CM | POA: Diagnosis not present

## 2014-09-19 DIAGNOSIS — Z954 Presence of other heart-valve replacement: Secondary | ICD-10-CM | POA: Diagnosis not present

## 2014-09-19 LAB — POCT INR: INR: 2.2

## 2014-09-24 ENCOUNTER — Encounter: Payer: Self-pay | Admitting: Internal Medicine

## 2014-09-24 ENCOUNTER — Other Ambulatory Visit: Payer: Self-pay

## 2014-09-24 ENCOUNTER — Ambulatory Visit (INDEPENDENT_AMBULATORY_CARE_PROVIDER_SITE_OTHER): Payer: Commercial Managed Care - HMO | Admitting: Internal Medicine

## 2014-09-24 VITALS — BP 126/70 | HR 75 | Ht 67.0 in | Wt 249.4 lb

## 2014-09-24 DIAGNOSIS — I10 Essential (primary) hypertension: Secondary | ICD-10-CM | POA: Diagnosis not present

## 2014-09-24 DIAGNOSIS — I779 Disorder of arteries and arterioles, unspecified: Secondary | ICD-10-CM | POA: Diagnosis not present

## 2014-09-24 DIAGNOSIS — I442 Atrioventricular block, complete: Secondary | ICD-10-CM | POA: Diagnosis not present

## 2014-09-24 DIAGNOSIS — Z950 Presence of cardiac pacemaker: Secondary | ICD-10-CM | POA: Diagnosis not present

## 2014-09-24 DIAGNOSIS — I739 Peripheral vascular disease, unspecified: Secondary | ICD-10-CM

## 2014-09-24 LAB — MDC_IDC_ENUM_SESS_TYPE_INCLINIC
Battery Impedance: 4800 Ohm
Battery Voltage: 2.76 V
Lead Channel Pacing Threshold Amplitude: 0.625 V
Lead Channel Pacing Threshold Amplitude: 1 V
Lead Channel Pacing Threshold Pulse Width: 0.5 ms
Lead Channel Sensing Intrinsic Amplitude: 2.5 mV
Lead Channel Setting Pacing Amplitude: 2.5 V
Lead Channel Setting Pacing Pulse Width: 0.5 ms
Lead Channel Setting Sensing Sensitivity: 3 mV
MDC IDC MSMT LEADCHNL RA IMPEDANCE VALUE: 505 Ohm
MDC IDC MSMT LEADCHNL RA PACING THRESHOLD PULSEWIDTH: 0.5 ms
MDC IDC MSMT LEADCHNL RV IMPEDANCE VALUE: 662 Ohm
MDC IDC PG SERIAL: 1380400
MDC IDC SESS DTM: 20160315135851

## 2014-09-24 MED ORDER — LOSARTAN POTASSIUM 100 MG PO TABS: 100.0000 mg | ORAL_TABLET | Freq: Every day | ORAL | Status: DC

## 2014-09-24 NOTE — Assessment & Plan Note (Signed)
He denies anginal symptoms. He'll continue his current medical therapy. 

## 2014-09-24 NOTE — Assessment & Plan Note (Signed)
His [employer] dual-chamber pacemaker is working normally. We'll plan to recheck in several months. 

## 2014-09-24 NOTE — Patient Instructions (Addendum)
Your physician wants you to follow-up in: 6 months in the device clinic and 12 months with Dr Knox Saliva will receive a reminder letter in the mail two months in advance. If you don't receive a letter, please call our office to schedule the follow-up appointment.   Your physician has recommended you make the following change in your medication:  1) Stop Ramipril 2) Start Losartan 100 mg daily

## 2014-09-24 NOTE — Progress Notes (Signed)
HPI [name] returns today for followup. He is a very pleasant 69 year old man with a history of symptomatic bradycardia due to complete heart block, status post permanent pacemaker insertion. He is s/p Bentall with CHB. He has done well in the interim. He again some weight and enrolled in Weight Wat[employer] and has lost over 30 pounds. His goal is to get down to 200 pounds. He denies chest pain, shortness of breath, or peripheral edema. No cough or hemoptysis. Allergies  Allergen Reactions  . Codeine Phosphate Hives, Shortness Of Breath, Itching, Rash and Other (See Comments)    REACTION: difficulty breathing  . Metoprolol Tartrate Other (See Comments)    REACTION: loss of vision. Can take the XL tablet not regular      Current Outpatient Prescriptions  Medication Sig Dispense Refill  . acetaminophen (TYLENOL) 325 MG tablet Take 2 tablets (650 mg total) by mouth every 6 (six) hours as needed. (Patient taking differently: Take 650 mg by mouth every 6 (six) hours as needed for mild pain. )    . Ascorbic Acid (VITAMIN C) 500 MG tablet Take 1,000 mg by mouth daily.     Marland Kitchen aspirin 81 MG tablet Take 81 mg by mouth daily.      Marland Kitchen atorvastatin (LIPITOR) 40 MG tablet Take 40 mg by mouth every evening.     . calcium carbonate (OS-CAL) 600 MG TABS Take 600 mg by mouth 2 (two) times daily with a meal.    . cetirizine (ZYRTEC) 10 MG tablet Take 20 mg by mouth daily.    . furosemide (LASIX) 40 MG tablet TAKE 1 TABLET TWICE DAILY 180 tablet 1  . Glucosamine-Chondroitin 250-200 MG CAPS Take 2 capsules by mouth daily.     Marland Kitchen guaiFENesin-dextromethorphan (ROBITUSSIN DM) 100-10 MG/5ML syrup Take 5 mLs by mouth every 4 (four) hours as needed for cough.    . hydrocortisone 2.5 % lotion Apply topically 2 (two) times daily. 59 mL 0  . Meclizine HCl (BONINE) 25 MG CHEW Chew 1 tablet by mouth daily.      . metoprolol succinate (TOPROL-XL) 100 MG 24 hr tablet TAKE 1 TABLET EVERY MORNING.  TAKE WITH OR IMMEDIATELY  FOLLOWING A MEAL 90 tablet 1  . Misc Natural Products (TART CHERRY ADVANCED) CAPS Take 2 capsules by mouth daily.     . Multiple Vitamin (MULTIVITAMIN) tablet Take 1 tablet by mouth daily.      . nitroGLYCERIN (NITROSTAT) 0.4 MG SL tablet Place 1 tablet (0.4 mg total) under the tongue every 5 (five) minutes as needed. For chest pain 90 tablet 1  . Omega-3 Fatty Acids (FISH OIL) 1000 MG CAPS Take 1,000 mg by mouth 2 (two) times daily.     Marland Kitchen omeprazole (PRILOSEC OTC) 20 MG tablet Take 1 tablet (20 mg total) by mouth daily. (Patient taking differently: Take 20 mg by mouth as needed. )    . potassium chloride (K-DUR,KLOR-CON) 10 MEQ tablet TAKE 1 TABLET TWICE DAILY 180 tablet 1  . predniSONE (DELTASONE) 20 MG tablet Take 1 tablet (20 mg total) by mouth daily with breakfast. 12 tablet 0  . ramipril (ALTACE) 10 MG capsule Take 1 capsule (10 mg total) by mouth daily. 90 capsule 3  . tadalafil (CIALIS) 10 MG tablet Take 40 mg by mouth daily as needed for erectile dysfunction.     . traMADol (ULTRAM) 50 MG tablet Take 50 mg by mouth every 6 (six) hours as needed for pain.     . vitamin B-12 (  CYANOCOBALAMIN) 1000 MCG tablet Take 1,000 mcg by mouth daily.    Marland Kitchen warfarin (COUMADIN) 10 MG tablet TAKE AS DIRECTED BY COUMADIN CLINIC 90 tablet 1  . [DISCONTINUED] potassium chloride (KLOR-CON 10) 10 MEQ CR tablet Take 1 tablet (10 mEq total) by mouth 2 (two) times daily. 180 tablet 6   No current facility-administered medications for this visit.     Past Medical History  Diagnosis Date  . Aortic stenosis 08/25/2010    a. Bentall aortic root replacement with a St. Jude mechanical valve and Hemashield conduit 02/2004.  . AV BLOCK, COMPLETE     a. s/p St Jude dual chamber pacemaker 02/2004.  Marland Kitchen CAD (coronary artery disease)     a. s/p CABGx2 (LIMA-dLAD, SVG-Cx). b. Low risk nuc 12/2012 without ischemia, EF 46% mild apical hypokinesia (EF 55% inf HK by echo).  . DIVERTICULOSIS, COLON 10/17/2007  . GOUT 01/16/2007  .  HEMORRHOIDS, INTERNAL 11/01/2008  . HYPERLIPIDEMIA 01/16/2007  . HYPERTENSION 01/16/2007  . LEG EDEMA 11/27/2008  . Impaired glucose tolerance   . Carotid artery disease     a. 0-39% bilateral ICA stenosis, stable mild hard plaque in carotid bulbs. F/u 03/2014 recommended.  Marland Kitchen Special screening for malignant neoplasm of prostate   . Warfarin anticoagulation     AVR  . Ejection fraction     a. EF 55% with inf HK, mild MR by echo 12/2012.  . Asthma   . Arthritis   . GERD (gastroesophageal reflux disease)     hxof     ROS:   All systems reviewed and negative except as noted in the HPI.   Past Surgical History  Procedure Laterality Date  . Coronary artery bypass graft    . Aortic valve replacement    . Pacemaker insertion    . Shoulder surgery    . Cardiac catheterization  06/2003  . Circumcision    . Colonoscopy    . Hernia repair    . Coronary stents       prior to bypass   . Inguinal hernia repair Bilateral 02/07/2013    Procedure: LAPAROSCOPIC BILATERAL INGUINAL HERNIA REPAIR;  Surgeon: Imogene Burn. Georgette Dover, MD;  Location: [facility];  Service: General;  Laterality: Bilateral;  . Insertion of mesh Bilateral 02/07/2013    Procedure: INSERTION OF MESH;  Surgeon: Imogene Burn. Georgette Dover, MD;  Location: [facility];  Service: General;  Laterality: Bilateral;     Family History  Problem Relation Age of Onset  . Heart attack Father   . Cancer Father     renal cancer  . Diabetes Brother   . Hypertension Mother   . Diabetes Mother      History   Social History  . Marital Status: Married    Spouse Name: N/A  . Number of Children: N/A  . Years of Education: N/A   Occupational History  . Not on file.   Social History Main Topics  . Smoking status: Former Smoker    Quit date: 07/12/1973  . Smokeless tobacco: Never Used  . Alcohol Use: No  . Drug Use: No  . Sexual Activity: No   Other Topics Concern  . Not on file   Social History Narrative     BP 126/70 mmHg  Pulse 75  Ht 5\' 7"   (1.702 m)  Wt 249 lb 6.4 oz (113.127 kg)  BMI 39.05 kg/m2  Physical Exam:  Well appearing 69 year old man,NAD HEENT: Unremarkable Neck:  7 cm JVD, no thyromegally Lungs:  Clear  with no wheezes, rales, or rhonchi. HEART:  Regular rate rhythm, no murmurs, no rubs, no clicks, mechanical S2 Abd:  soft, positive bowel sounds, no organomegally, no rebound, no guarding Ext:  2 plus pulses, no edema, no cyanosis, no clubbing Skin:  No rashes no nodules Neuro:  CN II through XII intact, motor grossly intact  DEVICE  Normal device function.  See PaceArt for details.   Assess/Plan:

## 2014-09-24 NOTE — Assessment & Plan Note (Signed)
His blood pressure is well controlled. No change in medical therapy. I've encouraged the patient to try and continue losing weight.

## 2014-09-30 ENCOUNTER — Telehealth: Payer: Self-pay | Admitting: Internal Medicine

## 2014-09-30 DIAGNOSIS — G8929 Other chronic pain: Secondary | ICD-10-CM

## 2014-09-30 DIAGNOSIS — M25569 Pain in unspecified knee: Principal | ICD-10-CM

## 2014-09-30 NOTE — Telephone Encounter (Signed)
Pt is requesting a referral to see Dr Gladstone Lighter . He receives shots in both knees .Marland Kitchen

## 2014-09-30 NOTE — Telephone Encounter (Signed)
Pt requesting referral to Ortho with Dr. Gladstone Lighter, okay to send referral?

## 2014-09-30 NOTE — Telephone Encounter (Signed)
ok 

## 2014-10-01 NOTE — Telephone Encounter (Signed)
Pt notified referral to Dr. Gladstone Lighter was done and someone will contact him regarding an appointment.

## 2014-10-02 ENCOUNTER — Encounter: Payer: Self-pay | Admitting: Internal Medicine

## 2014-10-03 ENCOUNTER — Ambulatory Visit (INDEPENDENT_AMBULATORY_CARE_PROVIDER_SITE_OTHER): Payer: Commercial Managed Care - HMO

## 2014-10-03 DIAGNOSIS — Z5181 Encounter for therapeutic drug level monitoring: Secondary | ICD-10-CM | POA: Diagnosis not present

## 2014-10-03 DIAGNOSIS — I359 Nonrheumatic aortic valve disorder, unspecified: Secondary | ICD-10-CM | POA: Diagnosis not present

## 2014-10-03 DIAGNOSIS — Z954 Presence of other heart-valve replacement: Secondary | ICD-10-CM | POA: Diagnosis not present

## 2014-10-03 DIAGNOSIS — Z7901 Long term (current) use of anticoagulants: Secondary | ICD-10-CM

## 2014-10-03 DIAGNOSIS — Z952 Presence of prosthetic heart valve: Secondary | ICD-10-CM

## 2014-10-03 LAB — POCT INR: INR: 4

## 2014-10-15 DIAGNOSIS — M170 Bilateral primary osteoarthritis of knee: Secondary | ICD-10-CM | POA: Diagnosis not present

## 2014-10-17 ENCOUNTER — Ambulatory Visit (INDEPENDENT_AMBULATORY_CARE_PROVIDER_SITE_OTHER): Payer: Commercial Managed Care - HMO | Admitting: *Deleted

## 2014-10-17 DIAGNOSIS — Z954 Presence of other heart-valve replacement: Secondary | ICD-10-CM | POA: Diagnosis not present

## 2014-10-17 DIAGNOSIS — I359 Nonrheumatic aortic valve disorder, unspecified: Secondary | ICD-10-CM | POA: Diagnosis not present

## 2014-10-17 DIAGNOSIS — Z7901 Long term (current) use of anticoagulants: Secondary | ICD-10-CM | POA: Diagnosis not present

## 2014-10-17 DIAGNOSIS — Z5181 Encounter for therapeutic drug level monitoring: Secondary | ICD-10-CM | POA: Diagnosis not present

## 2014-10-17 DIAGNOSIS — Z952 Presence of prosthetic heart valve: Secondary | ICD-10-CM

## 2014-10-17 LAB — POCT INR: INR: 2.4

## 2014-11-14 ENCOUNTER — Ambulatory Visit (INDEPENDENT_AMBULATORY_CARE_PROVIDER_SITE_OTHER): Payer: Commercial Managed Care - HMO | Admitting: *Deleted

## 2014-11-14 DIAGNOSIS — I359 Nonrheumatic aortic valve disorder, unspecified: Secondary | ICD-10-CM

## 2014-11-14 DIAGNOSIS — Z5181 Encounter for therapeutic drug level monitoring: Secondary | ICD-10-CM | POA: Diagnosis not present

## 2014-11-14 DIAGNOSIS — Z954 Presence of other heart-valve replacement: Secondary | ICD-10-CM | POA: Diagnosis not present

## 2014-11-14 DIAGNOSIS — Z7901 Long term (current) use of anticoagulants: Secondary | ICD-10-CM

## 2014-11-14 DIAGNOSIS — Z952 Presence of prosthetic heart valve: Secondary | ICD-10-CM

## 2014-11-14 LAB — POCT INR: INR: 2.8

## 2014-11-27 ENCOUNTER — Other Ambulatory Visit: Payer: Self-pay | Admitting: Cardiology

## 2014-12-03 ENCOUNTER — Other Ambulatory Visit: Payer: Self-pay | Admitting: Internal Medicine

## 2014-12-03 NOTE — Telephone Encounter (Signed)
Denied.  Filled on 07/19/14 for 6 months.  Request is too soon.

## 2014-12-19 ENCOUNTER — Ambulatory Visit (INDEPENDENT_AMBULATORY_CARE_PROVIDER_SITE_OTHER): Payer: Commercial Managed Care - HMO | Admitting: *Deleted

## 2014-12-19 DIAGNOSIS — I359 Nonrheumatic aortic valve disorder, unspecified: Secondary | ICD-10-CM | POA: Diagnosis not present

## 2014-12-19 DIAGNOSIS — Z7901 Long term (current) use of anticoagulants: Secondary | ICD-10-CM | POA: Diagnosis not present

## 2014-12-19 DIAGNOSIS — Z952 Presence of prosthetic heart valve: Secondary | ICD-10-CM

## 2014-12-19 DIAGNOSIS — Z5181 Encounter for therapeutic drug level monitoring: Secondary | ICD-10-CM | POA: Diagnosis not present

## 2014-12-19 DIAGNOSIS — Z954 Presence of other heart-valve replacement: Secondary | ICD-10-CM

## 2014-12-19 LAB — POCT INR: INR: 2.6

## 2014-12-23 ENCOUNTER — Telehealth: Payer: Self-pay | Admitting: Internal Medicine

## 2014-12-23 MED ORDER — METOPROLOL SUCCINATE ER 100 MG PO TB24: ORAL_TABLET | ORAL | Status: DC

## 2014-12-23 NOTE — Telephone Encounter (Signed)
Pt needs refills on metoprolol 100 mg #90 w/refills send to [facility]

## 2014-12-23 NOTE — Telephone Encounter (Signed)
[name] wife Mel Almond, notified Rx sent to [facility].

## 2014-12-24 DIAGNOSIS — I442 Atrioventricular block, complete: Secondary | ICD-10-CM | POA: Diagnosis not present

## 2015-01-03 ENCOUNTER — Telehealth: Payer: Self-pay

## 2015-01-03 ENCOUNTER — Other Ambulatory Visit: Payer: Self-pay | Admitting: *Deleted

## 2015-01-03 ENCOUNTER — Other Ambulatory Visit: Payer: Self-pay | Admitting: Cardiology

## 2015-01-03 MED ORDER — WARFARIN SODIUM 10 MG PO TABS: ORAL_TABLET | ORAL | Status: DC

## 2015-01-03 NOTE — Telephone Encounter (Signed)
Rx sent 

## 2015-01-23 DIAGNOSIS — M170 Bilateral primary osteoarthritis of knee: Secondary | ICD-10-CM | POA: Diagnosis not present

## 2015-01-30 ENCOUNTER — Ambulatory Visit (INDEPENDENT_AMBULATORY_CARE_PROVIDER_SITE_OTHER): Payer: Commercial Managed Care - HMO

## 2015-01-30 DIAGNOSIS — Z954 Presence of other heart-valve replacement: Secondary | ICD-10-CM

## 2015-01-30 DIAGNOSIS — I359 Nonrheumatic aortic valve disorder, unspecified: Secondary | ICD-10-CM | POA: Diagnosis not present

## 2015-01-30 DIAGNOSIS — Z7901 Long term (current) use of anticoagulants: Secondary | ICD-10-CM

## 2015-01-30 DIAGNOSIS — Z952 Presence of prosthetic heart valve: Secondary | ICD-10-CM

## 2015-01-30 DIAGNOSIS — Z5181 Encounter for therapeutic drug level monitoring: Secondary | ICD-10-CM | POA: Diagnosis not present

## 2015-01-30 LAB — POCT INR: INR: 2.3

## 2015-01-31 ENCOUNTER — Telehealth: Payer: Self-pay | Admitting: Internal Medicine

## 2015-01-31 NOTE — Telephone Encounter (Signed)
Pt needs a refill on his Potassium. Pt uses mail order pharmacy for [facility].

## 2015-02-03 MED ORDER — POTASSIUM CHLORIDE CRYS ER 10 MEQ PO TBCR: 10.0000 meq | EXTENDED_RELEASE_TABLET | Freq: Two times a day (BID) | ORAL | Status: DC

## 2015-02-03 NOTE — Telephone Encounter (Signed)
[name] wife Jeanett Schlein notified Rx sent to [facility].

## 2015-02-28 ENCOUNTER — Other Ambulatory Visit: Payer: Self-pay | Admitting: Internal Medicine

## 2015-03-03 ENCOUNTER — Telehealth: Payer: Self-pay | Admitting: Internal Medicine

## 2015-03-03 MED ORDER — FUROSEMIDE 40 MG PO TABS: 40.0000 mg | ORAL_TABLET | Freq: Two times a day (BID) | ORAL | Status: DC

## 2015-03-03 NOTE — Telephone Encounter (Signed)
Pt needs refill rx fax  to [facility] mail order pharm [phone]. Pt needs furosemide 40 mg twice a day #180 w/refills

## 2015-03-03 NOTE — Telephone Encounter (Signed)
Rx sent 

## 2015-03-13 ENCOUNTER — Ambulatory Visit (INDEPENDENT_AMBULATORY_CARE_PROVIDER_SITE_OTHER): Payer: Commercial Managed Care - HMO | Admitting: Pharmacist

## 2015-03-13 DIAGNOSIS — Z954 Presence of other heart-valve replacement: Secondary | ICD-10-CM

## 2015-03-13 DIAGNOSIS — Z7901 Long term (current) use of anticoagulants: Secondary | ICD-10-CM

## 2015-03-13 DIAGNOSIS — I359 Nonrheumatic aortic valve disorder, unspecified: Secondary | ICD-10-CM | POA: Diagnosis not present

## 2015-03-13 DIAGNOSIS — Z5181 Encounter for therapeutic drug level monitoring: Secondary | ICD-10-CM

## 2015-03-13 DIAGNOSIS — Z952 Presence of prosthetic heart valve: Secondary | ICD-10-CM

## 2015-03-13 LAB — POCT INR: INR: 3

## 2015-03-26 ENCOUNTER — Ambulatory Visit (INDEPENDENT_AMBULATORY_CARE_PROVIDER_SITE_OTHER): Payer: Commercial Managed Care - HMO | Admitting: *Deleted

## 2015-03-26 ENCOUNTER — Telehealth: Payer: Self-pay | Admitting: Internal Medicine

## 2015-03-26 DIAGNOSIS — Z950 Presence of cardiac pacemaker: Secondary | ICD-10-CM

## 2015-03-26 DIAGNOSIS — I442 Atrioventricular block, complete: Secondary | ICD-10-CM

## 2015-03-26 LAB — CUP PACEART INCLINIC DEVICE CHECK
Battery Impedance: 7700 Ohm
Battery Voltage: 2.73 V
Brady Statistic RV Percent Paced: 99 %
Date Time Interrogation Session: 20160914102333
Lead Channel Impedance Value: 501 Ohm
Lead Channel Pacing Threshold Pulse Width: 0.5 ms
Lead Channel Pacing Threshold Pulse Width: 0.5 ms
Lead Channel Setting Pacing Pulse Width: 0.5 ms
Lead Channel Setting Sensing Sensitivity: 3 mV
MDC IDC MSMT LEADCHNL RA PACING THRESHOLD AMPLITUDE: 1 V
MDC IDC MSMT LEADCHNL RA SENSING INTR AMPL: 2.1 mV
MDC IDC MSMT LEADCHNL RV IMPEDANCE VALUE: 618 Ohm
MDC IDC MSMT LEADCHNL RV PACING THRESHOLD AMPLITUDE: 0.75 V
MDC IDC PG SERIAL: 1380400
MDC IDC SET LEADCHNL RA PACING AMPLITUDE: 2.5 V
MDC IDC STAT BRADY RA PERCENT PACED: 42 %

## 2015-03-26 MED ORDER — PREDNISONE 20 MG PO TABS: 20.0000 mg | ORAL_TABLET | Freq: Every day | ORAL | Status: DC

## 2015-03-26 NOTE — Telephone Encounter (Signed)
Please see message and advise 

## 2015-03-26 NOTE — Telephone Encounter (Signed)
Prednisone 20 mg  #14 BID Offer return office visit if desired

## 2015-03-26 NOTE — Progress Notes (Signed)
Pacemaker check in clinic. Normal device function. Thresholds, sensing, impedances consistent with previous measurements. Device programmed to maximize longevity. 24 mode switches- longest 24 mins, pk A 591 +warfarin. No high ventricular rates noted. Device programmed at appropriate safety margins. Histogram distribution appropriate for patient activity level. Device programmed to optimize intrinsic conduction. Estimated longevity 1-1.5 years. ROV with GT in March.

## 2015-03-26 NOTE — Telephone Encounter (Signed)
Spoke to pt told him Rx for Prednisone sent to pharmacy if not improved office visit. Pt verbalized understanding.

## 2015-03-26 NOTE — Telephone Encounter (Signed)
Pt call to say that his right hand is swollen possibly a gout flair up. Pt is asking if predisone can be called in .    Pharmacy [facility]

## 2015-04-11 ENCOUNTER — Telehealth: Payer: Self-pay | Admitting: Internal Medicine

## 2015-04-11 MED ORDER — PREDNISONE 20 MG PO TABS: 20.0000 mg | ORAL_TABLET | Freq: Two times a day (BID) | ORAL | Status: DC

## 2015-04-11 NOTE — Telephone Encounter (Addendum)
Pt states his gout has returned. Pt concerned he did not have the correct medicine dosage, because  Dr Raliegh Ip advised prednisone #14  BID However when picked up RX, it only said  predniSONE (DELTASONE) 20 MG tablet 14 tablet 0 03/26/2015     Sig - Route: Take 1 tablet (20 mg total) by mouth daily with breakfast. - Oral   Pt hoping he can get another refill and take the correct amount so that is will go away.  Pt states prednisone has always worked in the past.  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

## 2015-04-11 NOTE — Telephone Encounter (Signed)
Spoke to pt, told him sorry about that, new Rx sent over for Prednisone twice a day for 7 days. Pt verbalized understanding.

## 2015-04-21 ENCOUNTER — Ambulatory Visit (INDEPENDENT_AMBULATORY_CARE_PROVIDER_SITE_OTHER): Payer: Commercial Managed Care - HMO | Admitting: *Deleted

## 2015-04-21 ENCOUNTER — Encounter: Payer: Self-pay | Admitting: Cardiology

## 2015-04-21 ENCOUNTER — Ambulatory Visit (INDEPENDENT_AMBULATORY_CARE_PROVIDER_SITE_OTHER): Payer: Commercial Managed Care - HMO | Admitting: Cardiology

## 2015-04-21 VITALS — BP 130/90 | HR 68 | Ht 67.0 in | Wt 246.8 lb

## 2015-04-21 DIAGNOSIS — Z951 Presence of aortocoronary bypass graft: Secondary | ICD-10-CM | POA: Diagnosis not present

## 2015-04-21 DIAGNOSIS — Z7901 Long term (current) use of anticoagulants: Secondary | ICD-10-CM

## 2015-04-21 DIAGNOSIS — I359 Nonrheumatic aortic valve disorder, unspecified: Secondary | ICD-10-CM

## 2015-04-21 DIAGNOSIS — Z954 Presence of other heart-valve replacement: Secondary | ICD-10-CM

## 2015-04-21 DIAGNOSIS — Z950 Presence of cardiac pacemaker: Secondary | ICD-10-CM

## 2015-04-21 DIAGNOSIS — Z5181 Encounter for therapeutic drug level monitoring: Secondary | ICD-10-CM

## 2015-04-21 DIAGNOSIS — I2510 Atherosclerotic heart disease of native coronary artery without angina pectoris: Secondary | ICD-10-CM | POA: Diagnosis not present

## 2015-04-21 DIAGNOSIS — I2583 Coronary atherosclerosis due to lipid rich plaque: Secondary | ICD-10-CM

## 2015-04-21 LAB — POCT INR: INR: 4.8

## 2015-04-21 MED ORDER — NITROGLYCERIN 0.4 MG SL SUBL: 0.4000 mg | SUBLINGUAL_TABLET | SUBLINGUAL | Status: DC | PRN

## 2015-04-21 NOTE — Progress Notes (Signed)
Cardiology Office Note   Date:  04/21/2015   ID:  [name], DOB 18-Feb-1946, MRN [id number]  PCP:  Nyoka Cowden, MD  Cardiologist:   Candee Furbish, MD       History of Present Illness: [name] is a 69 y.o. male former patient of Dr. Ron Parker here to establish care. Has coronary artery disease as well as pacemaker, and aortic valve disease.  In 2013, echo showed normal LV function with mild inferior hypokinesis. Has a mechanical prosthetic aortic valve working well. Mild mitral regurgitation. Nuclear stress test in 2013 also showed no ischemia.  Knee discomfort has been an issue. He was a Development worker, community for several years. Has lower extremity edema bilaterally. Overall doing well.  Weight loss has been an issue for him.  No chest pain, no shortness of breath, no syncope, no bleeding.  Previously had a hernia surgery and while on Lovenox bridge protocol bled, had to go back to the hospital for 2 weeks.    Past Medical History  Diagnosis Date  . Aortic stenosis 08/25/2010    a. Bentall aortic root replacement with a St. Jude mechanical valve and Hemashield conduit 02/2004.  . AV BLOCK, COMPLETE     a. s/p St Jude dual chamber pacemaker 02/2004.  Marland Kitchen CAD (coronary artery disease)     a. s/p CABGx2 (LIMA-dLAD, SVG-Cx). b. Low risk nuc 12/2012 without ischemia, EF 46% mild apical hypokinesia (EF 55% inf HK by echo).  . DIVERTICULOSIS, COLON 10/17/2007  . GOUT 01/16/2007  . HEMORRHOIDS, INTERNAL 11/01/2008  . HYPERLIPIDEMIA 01/16/2007  . HYPERTENSION 01/16/2007  . LEG EDEMA 11/27/2008  . Impaired glucose tolerance   . Carotid artery disease (HCC)     a. 0-39% bilateral ICA stenosis, stable mild hard plaque in carotid bulbs. F/u 03/2014 recommended.  Marland Kitchen Special screening for malignant neoplasm of prostate   . Warfarin anticoagulation     AVR  . Ejection fraction     a. EF 55% with inf HK, mild MR by echo 12/2012.  . Asthma   . Arthritis   . GERD (gastroesophageal reflux  disease)     hxof     Past Surgical History  Procedure Laterality Date  . Coronary artery bypass graft    . Aortic valve replacement    . Pacemaker insertion    . Shoulder surgery    . Cardiac catheterization  06/2003  . Circumcision    . Colonoscopy    . Hernia repair    . Coronary stents       prior to bypass   . Inguinal hernia repair Bilateral 02/07/2013    Procedure: LAPAROSCOPIC BILATERAL INGUINAL HERNIA REPAIR;  Surgeon: Imogene Burn. Georgette Dover, MD;  Location: [facility];  Service: General;  Laterality: Bilateral;  . Insertion of mesh Bilateral 02/07/2013    Procedure: INSERTION OF MESH;  Surgeon: Imogene Burn. Georgette Dover, MD;  Location: [facility];  Service: General;  Laterality: Bilateral;     Current Outpatient Prescriptions  Medication Sig Dispense Refill  . acetaminophen (TYLENOL) 325 MG tablet Take 2 tablets (650 mg total) by mouth every 6 (six) hours as needed. (Patient taking differently: Take 650 mg by mouth every 6 (six) hours as needed for mild pain. )    . Ascorbic Acid (VITAMIN C) 500 MG tablet Take 1,000 mg by mouth daily.     Marland Kitchen aspirin 81 MG tablet Take 81 mg by mouth daily.      Marland Kitchen atorvastatin (LIPITOR) 40 MG tablet TAKE  1 TABLET EVERY DAY  (REPLACES  PRAVASTATIN) 90 tablet 3  . calcium carbonate (OS-CAL) 600 MG TABS Take 600 mg by mouth 2 (two) times daily with a meal.    . cetirizine (ZYRTEC) 10 MG tablet Take 20 mg by mouth daily.    . furosemide (LASIX) 40 MG tablet Take 1 tablet (40 mg total) by mouth 2 (two) times daily. 180 tablet 1  . Glucosamine-Chondroitin 250-200 MG CAPS Take 2 capsules by mouth daily.     Marland Kitchen guaiFENesin-dextromethorphan (ROBITUSSIN DM) 100-10 MG/5ML syrup Take 5 mLs by mouth every 4 (four) hours as needed for cough.    . hydrocortisone 2.5 % lotion Apply topically 2 (two) times daily. (Patient taking differently: Apply topically as needed (for rash). ) 59 mL 0  . losartan (COZAAR) 100 MG tablet Take 1 tablet (100 mg total) by mouth daily. 90 tablet 3  .  Meclizine HCl (BONINE) 25 MG CHEW Chew 1 tablet by mouth daily.      . metoprolol succinate (TOPROL-XL) 100 MG 24 hr tablet TAKE 1 TABLET EVERY MORNING.  TAKE WITH OR IMMEDIATELY FOLLOWING A MEAL 90 tablet 1  . Misc Natural Products (TART CHERRY ADVANCED) CAPS Take 2 capsules by mouth daily.     . Multiple Vitamin (MULTIVITAMIN) tablet Take 1 tablet by mouth daily.      . nitroGLYCERIN (NITROSTAT) 0.4 MG SL tablet Place 1 tablet (0.4 mg total) under the tongue every 5 (five) minutes as needed. For chest pain 90 tablet 1  . Omega-3 Fatty Acids (FISH OIL) 1000 MG CAPS Take 1,000 mg by mouth 2 (two) times daily.     . potassium chloride (K-DUR,KLOR-CON) 10 MEQ tablet Take 1 tablet (10 mEq total) by mouth 2 (two) times daily. 180 tablet 3  . tadalafil (CIALIS) 10 MG tablet Take 40 mg by mouth daily as needed for erectile dysfunction.     . traMADol (ULTRAM) 50 MG tablet Take 50 mg by mouth every 6 (six) hours as needed for pain.     . vitamin B-12 (CYANOCOBALAMIN) 1000 MCG tablet Take 1,000 mcg by mouth daily.    Marland Kitchen warfarin (COUMADIN) 10 MG tablet TAKE AS DIRECTED BY COUMADIN CLINIC 90 tablet 3  . predniSONE (DELTASONE) 20 MG tablet Take 1 tablet (20 mg total) by mouth 2 (two) times daily with a meal. (Patient not taking: Reported on 04/21/2015) 14 tablet 0  . [DISCONTINUED] potassium chloride (KLOR-CON 10) 10 MEQ CR tablet Take 1 tablet (10 mEq total) by mouth 2 (two) times daily. 180 tablet 6   No current facility-administered medications for this visit.    Allergies:   Codeine phosphate; Metoprolol tartrate; and Ramipril    Social History:  The patient  reports that he quit smoking about 41 years ago. He has never used smokeless tobacco. He reports that he does not drink alcohol or use illicit drugs. Retired Development worker, community    Family History:  The patient's family history includes Cancer in his father; Diabetes in his brother and mother; Heart attack in his father; Hypertension in his mother.     ROS:  Please see the history of present illness.   Otherwise, review of systems are positive for none.   All other systems are reviewed and negative.    PHYSICAL EXAM: VS:  BP 130/90 mmHg  Pulse 68  Ht 5\' 7"  (1.702 m)  Wt 246 lb 12.8 oz (111.948 kg)  BMI 38.65 kg/m2  SpO2 97% , BMI Body mass index is 38.65  kg/(m^2). GEN: Well nourished, well developed, in no acute distress HEENT: normal Neck: no JVD, carotid bruits, or masses Cardiac: RRR; no murmurs, rubs, or gallops, 1+BLE  edema S2 CLICK Respiratory:  clear to auscultation bilaterally, normal work of breathing GI: soft, nontender, nondistended, + BS, obese MS: no deformity or atrophy Skin: warm and dry, no rash Neuro:  Strength and sensation are intact Psych: euthymic mood, full affect   EKG:  EKG is not ordered today.  Recent Labs: 08/13/2014: B Natriuretic Peptide 120.4*; BUN 17; Creatinine, Ser 1.04; Hemoglobin 14.3; Platelets 176; Potassium 3.7; Sodium 136    Lipid Panel    Component Value Date/Time   CHOL 192 02/13/2014 0759   TRIG 171.0* 02/13/2014 0759   TRIG 91 06/14/2006 0825   HDL 36.80* 02/13/2014 0759   CHOLHDL 5 02/13/2014 0759   CHOLHDL 3.0 CALC 06/14/2006 0825   VLDL 34.2 02/13/2014 0759   LDLCALC 121* 02/13/2014 0759      Wt Readings from Last 3 Encounters:  04/21/15 246 lb 12.8 oz (111.948 kg)  09/24/14 249 lb 6.4 oz (113.127 kg)  08/08/14 247 lb (112.038 kg)    04/2014-vascular study, carotids Heterogeneous plaque, bilaterally. Stable, 1-39% bilateral ICA stenosis. Patent vertebral arteries with antegrade flow. Normal subclavian arteries, bilaterally. f/u 2 years.  Other studies Reviewed: Additional studies/ records that were reviewed today include: Prior office notes reviewed, lab work reviewed, previous LDL 121. Review of the above records demonstrates: As above   ASSESSMENT AND PLAN:  1.  Aortic stenosis-history of aortic valve replacement in 2005, bioprosthetic. Echo 2013  functioning well.  2. History of aortic valve replacement, mechanical-2005 as above. Functioning well.  3. Coronary artery disease-bypass surgery at the time of aortic valve replacement 2005. 2013 no ischemia on nuclear stress test. Secondary prevention.  4. Hyperlipidemia-previously changed from Pravachol to atorvastatin, guideline directed therapy. LDL goal 70. He is getting lab work soon with Dr. Raliegh Ip.  5. Carotid artery disease-mild plaque previously. Stable disease. We will likely check again in 2017. Mild plaque bilaterally.  6. Pacemaker-working well, Dr. Lovena Le. Visit reviewed. He states he has about a year left of battery life.  7. Warfarin-previously noted that he was anticoagulated for his aortic valve prosthesis. I believe he does have an mechanical valve however.  8. Obesity-continue to encourage weight loss, decrease carbohydrates.     Current medicines are reviewed at length with the patient today.  The patient does not have concerns regarding medicines.  The following changes have been made:  no change  Labs/ tests ordered today include: none  No orders of the defined types were placed in this encounter.     Disposition:   FU with Skains in 1 year  Signed, Candee Furbish, MD  04/21/2015 9:35 AM    [facility] [place], [place], [place]  [address] Phone: [phone]; Fax: [phone]

## 2015-04-21 NOTE — Addendum Note (Signed)
Addended by: Shellia Cleverly on: 04/21/2015 09:45 AM   Modules accepted: Orders

## 2015-04-21 NOTE — Patient Instructions (Addendum)
Medication Instructions:  The current medical regimen is effective;  continue present plan and medications.  Follow-Up: Follow up in 1 year with Dr. Skains.  You will receive a letter in the mail 2 months before you are due.  Please call us when you receive this letter to schedule your follow up appointment.  Thank you for choosing [facility]!!     

## 2015-04-23 ENCOUNTER — Other Ambulatory Visit (INDEPENDENT_AMBULATORY_CARE_PROVIDER_SITE_OTHER): Payer: Commercial Managed Care - HMO

## 2015-04-23 DIAGNOSIS — Z125 Encounter for screening for malignant neoplasm of prostate: Secondary | ICD-10-CM | POA: Diagnosis not present

## 2015-04-23 DIAGNOSIS — Z0000 Encounter for general adult medical examination without abnormal findings: Secondary | ICD-10-CM

## 2015-04-23 LAB — CBC WITH DIFFERENTIAL/PLATELET
Basophils Absolute: 0 10*3/uL (ref 0.0–0.1)
Basophils Relative: 0.4 % (ref 0.0–3.0)
EOS PCT: 1.8 % (ref 0.0–5.0)
Eosinophils Absolute: 0.1 10*3/uL (ref 0.0–0.7)
HCT: 43.8 % (ref 39.0–52.0)
HEMOGLOBIN: 14.3 g/dL (ref 13.0–17.0)
Lymphocytes Relative: 17.5 % (ref 12.0–46.0)
Lymphs Abs: 1.2 10*3/uL (ref 0.7–4.0)
MCHC: 32.7 g/dL (ref 30.0–36.0)
MCV: 88.3 fl (ref 78.0–100.0)
MONO ABS: 0.4 10*3/uL (ref 0.1–1.0)
Monocytes Relative: 6.4 % (ref 3.0–12.0)
Neutro Abs: 5.2 10*3/uL (ref 1.4–7.7)
Neutrophils Relative %: 73.9 % (ref 43.0–77.0)
Platelets: 158 10*3/uL (ref 150.0–400.0)
RBC: 4.96 Mil/uL (ref 4.22–5.81)
RDW: 15.3 % (ref 11.5–15.5)
WBC: 7.1 10*3/uL (ref 4.0–10.5)

## 2015-04-23 LAB — LIPID PANEL
Cholesterol: 151 mg/dL (ref 0–200)
HDL: 41.5 mg/dL (ref >39.00)
LDL CALC: 75 mg/dL (ref 0–99)
NonHDL: 109.69
TRIGLYCERIDES: 173 mg/dL — AB (ref 0.0–149.0)
Total CHOL/HDL Ratio: 4
VLDL: 34.6 mg/dL (ref 0.0–40.0)

## 2015-04-23 LAB — HEPATIC FUNCTION PANEL
ALBUMIN: 3.9 g/dL (ref 3.5–5.2)
ALT: 26 U/L (ref 0–53)
AST: 20 U/L (ref 0–37)
Alkaline Phosphatase: 51 U/L (ref 39–117)
BILIRUBIN TOTAL: 1 mg/dL (ref 0.2–1.2)
Bilirubin, Direct: 0.2 mg/dL (ref 0.0–0.3)
Total Protein: 6 g/dL (ref 6.0–8.3)

## 2015-04-23 LAB — POCT URINALYSIS DIPSTICK
BILIRUBIN UA: NEGATIVE
Blood, UA: NEGATIVE
GLUCOSE UA: NEGATIVE
KETONES UA: NEGATIVE
Leukocytes, UA: NEGATIVE
Nitrite, UA: NEGATIVE
SPEC GRAV UA: 1.025
Urobilinogen, UA: 0.2
pH, UA: 5.5

## 2015-04-23 LAB — BASIC METABOLIC PANEL
BUN: 15 mg/dL (ref 6–23)
CHLORIDE: 103 meq/L (ref 96–112)
CO2: 32 mEq/L (ref 19–32)
Calcium: 9 mg/dL (ref 8.4–10.5)
Creatinine, Ser: 0.84 mg/dL (ref 0.40–1.50)
GFR: 95.79 mL/min (ref >60.00)
GLUCOSE: 203 mg/dL — AB (ref 70–99)
POTASSIUM: 4.4 meq/L (ref 3.5–5.1)
Sodium: 141 mEq/L (ref 135–145)

## 2015-04-23 LAB — PSA: PSA: 1.45 ng/mL (ref 0.10–4.00)

## 2015-04-23 LAB — TSH: TSH: 1.49 u[IU]/mL (ref 0.35–4.50)

## 2015-04-25 ENCOUNTER — Other Ambulatory Visit (INDEPENDENT_AMBULATORY_CARE_PROVIDER_SITE_OTHER): Payer: Commercial Managed Care - HMO

## 2015-04-25 DIAGNOSIS — R739 Hyperglycemia, unspecified: Secondary | ICD-10-CM

## 2015-04-25 LAB — HEMOGLOBIN A1C: HEMOGLOBIN A1C: 9 % — AB (ref 4.6–6.5)

## 2015-04-28 ENCOUNTER — Telehealth: Payer: Self-pay | Admitting: Internal Medicine

## 2015-04-28 ENCOUNTER — Ambulatory Visit (INDEPENDENT_AMBULATORY_CARE_PROVIDER_SITE_OTHER): Payer: Commercial Managed Care - HMO | Admitting: Internal Medicine

## 2015-04-28 ENCOUNTER — Encounter: Payer: Self-pay | Admitting: Internal Medicine

## 2015-04-28 VITALS — BP 120/68 | HR 69 | Temp 98.3°F | Resp 20 | Ht 66.25 in | Wt 246.0 lb

## 2015-04-28 DIAGNOSIS — Z23 Encounter for immunization: Secondary | ICD-10-CM

## 2015-04-28 DIAGNOSIS — I10 Essential (primary) hypertension: Secondary | ICD-10-CM | POA: Diagnosis not present

## 2015-04-28 DIAGNOSIS — E1110 Type 2 diabetes mellitus with ketoacidosis without coma: Secondary | ICD-10-CM

## 2015-04-28 DIAGNOSIS — I350 Nonrheumatic aortic (valve) stenosis: Secondary | ICD-10-CM

## 2015-04-28 DIAGNOSIS — E785 Hyperlipidemia, unspecified: Secondary | ICD-10-CM | POA: Diagnosis not present

## 2015-04-28 DIAGNOSIS — E1310 Other specified diabetes mellitus with ketoacidosis without coma: Secondary | ICD-10-CM

## 2015-04-28 DIAGNOSIS — Z0000 Encounter for general adult medical examination without abnormal findings: Secondary | ICD-10-CM

## 2015-04-28 MED ORDER — GLIPIZIDE ER 2.5 MG PO TB24: 2.5000 mg | ORAL_TABLET | Freq: Every day | ORAL | Status: DC

## 2015-04-28 MED ORDER — METFORMIN HCL 1000 MG PO TABS
1000.0000 mg | ORAL_TABLET | Freq: Two times a day (BID) | ORAL | Status: DC
Start: 2015-04-28 — End: 2016-04-18

## 2015-04-28 NOTE — Patient Instructions (Signed)
Limit your sodium (Salt) intake  You need to lose weight.  Consider a lower calorie diet and regular exercise.   Please check your hemoglobin A1c every 3 monthsBlood Glucose Monitoring, Adult Monitoring your blood glucose (also know as blood sugar) helps you to manage your diabetes. It also helps you and your health care provider monitor your diabetes and determine how well your treatment plan is working. WHY SHOULD YOU MONITOR YOUR BLOOD GLUCOSE?  It can help you understand how food, exercise, and medicine affect your blood glucose.  It allows you to know what your blood glucose is at any given moment. You can quickly tell if you are having low blood glucose (hypoglycemia) or high blood glucose (hyperglycemia).  It can help you and your health care provider know how to adjust your medicines.  It can help you understand how to manage an illness or adjust medicine for exercise. WHEN SHOULD YOU TEST? Your health care provider will help you decide how often you should check your blood glucose. This may depend on the type of diabetes you have, your diabetes control, or the types of medicines you are taking. Be sure to write down all of your blood glucose readings so that this information can be reviewed with your health care provider. See below for examples of testing times that your health care provider may suggest. Type 1 Diabetes  Test at least 2 times per day if your diabetes is well controlled, if you are using an insulin pump, or if you perform multiple daily injections.  If your diabetes is not well controlled or if you are sick, you may need to test more often.  It is a good idea to also test:  Before every insulin injection.  Before and after exercise.  Between meals and 2 hours after a meal.  Occasionally between 2:00 a.m. and 3:00 a.m. Type 2 Diabetes  If you are taking insulin, test at least 2 times per day. However, it is best to test before every insulin injection.  If  you take medicines by mouth (orally), test 2 times a day.  If you are on a controlled diet, test once a day.  If your diabetes is not well controlled or if you are sick, you may need to monitor more often. HOW TO MONITOR YOUR BLOOD GLUCOSE Supplies Needed  Blood glucose meter.  Test strips for your meter. Each meter has its own strips. You must use the strips that go with your own meter.  A pricking needle (lancet).  A device that holds the lancet (lancing device).  A journal or log book to write down your results. Procedure  Wash your hands with soap and water. Alcohol is not preferred.  Prick the side of your finger (not the tip) with the lancet.  Gently milk the finger until a small drop of blood appears.  Follow the instructions that come with your meter for inserting the test strip, applying blood to the strip, and using your blood glucose meter. Other Areas to Get Blood for Testing Some meters allow you to use other areas of your body (other than your finger) to test your blood. These areas are called alternative sites. The most common alternative sites are:  The forearm.  The thigh.  The back area of the lower leg.  The palm of the hand. The blood flow in these areas is slower. Therefore, the blood glucose values you get may be delayed, and the numbers are different from what you would  get from your fingers. Do not use alternative sites if you think you are having hypoglycemia. Your reading will not be accurate. Always use a finger if you are having hypoglycemia. Also, if you cannot feel your lows (hypoglycemia unawareness), always use your fingers for your blood glucose checks. ADDITIONAL TIPS FOR GLUCOSE MONITORING  Do not reuse lancets.  Always carry your supplies with you.  All blood glucose meters have a 24-hour "hotline" number to call if you have questions or need help.  Adjust (calibrate) your blood glucose meter with a control solution after finishing a  few boxes of strips. BLOOD GLUCOSE RECORD KEEPING It is a good idea to keep a daily record or log of your blood glucose readings. Most glucose meters, if not all, keep your glucose records stored in the meter. Some meters come with the ability to download your records to your home computer. Keeping a record of your blood glucose readings is especially helpful if you are wanting to look for patterns. Make notes to go along with the blood glucose readings because you might forget what happened at that exact time. Keeping good records helps you and your health care provider to work together to achieve good diabetes management.    This information is not intended to replace advice given to you by your health care provider. Make sure you discuss any questions you have with your health care provider.   Document Released: 07/01/2003 Document Revised: 07/19/2014 Document Reviewed: 11/20/2012 Elsevier Interactive Patient Education 2016 [place]. Diabetes and Standards of Medical Care Diabetes is complicated. You may find that your diabetes team includes a dietitian, nurse, diabetes educator, eye doctor, and more. To help everyone know what is going on and to help you get the care you deserve, the following schedule of care was developed to help keep you on track. Below are the tests, exams, vaccines, medicines, education, and plans you will need. HbA1c test This test shows how well you have controlled your glucose over the past 2-3 months. It is used to see if your diabetes management plan needs to be adjusted.   It is performed at least 2 times a year if you are meeting treatment goals.  It is performed 4 times a year if therapy has changed or if you are not meeting treatment goals. Blood pressure test  This test is performed at every routine medical visit. The goal is less than 140/90 mm Hg for most people, but 130/80 mm Hg in some cases. Ask your health care provider about your goal. Dental  exam  Follow up with the dentist regularly. Eye exam  If you are diagnosed with type 1 diabetes as a child, get an exam upon reaching the age of 27 years or older and having had diabetes for 3-5 years. Yearly eye exams are recommended after that initial eye exam.  If you are diagnosed with type 1 diabetes as an adult, get an exam within 5 years of diagnosis and then yearly.  If you are diagnosed with type 2 diabetes, get an exam as soon as possible after the diagnosis and then yearly. Foot care exam  Visual foot exams are performed at every routine medical visit. The exams check for cuts, injuries, or other problems with the feet.  You should have a complete foot exam performed every year. This exam includes an inspection of the structure and skin of your feet, a check of the pulses in your feet, and a check of the sensation in your  feet.  Type 1 diabetes: The first exam is performed 5 years after diagnosis.  Type 2 diabetes: The first exam is performed at the time of diagnosis.  Check your feet nightly for cuts, injuries, or other problems with your feet. Tell your health care provider if anything is not healing. Kidney function test (urine microalbumin)  This test is performed once a year.  Type 1 diabetes: The first test is performed 5 years after diagnosis.  Type 2 diabetes: The first test is performed at the time of diagnosis.  A serum creatinine and estimated glomerular filtration rate (eGFR) test is done once a year to assess the level of chronic kidney disease (CKD), if present. Lipid profile (cholesterol, HDL, LDL, triglycerides)  Performed every 5 years for most people.  The goal for LDL is less than 100 mg/dL. If you are at high risk, the goal is less than 70 mg/dL.  The goal for HDL is 40 mg/dL-50 mg/dL for men and 50 mg/dL-60 mg/dL for women. An HDL cholesterol of 60 mg/dL or higher gives some protection against heart disease.  The goal for triglycerides is less  than 150 mg/dL. Immunizations  The flu (influenza) vaccine is recommended yearly for every person 84 months of age or older who has diabetes.  The pneumonia (pneumococcal) vaccine is recommended for every person 44 years of age or older who has diabetes. Adults 42 years of age or older may receive the pneumonia vaccine as a series of two separate shots.  The hepatitis B vaccine is recommended for adults shortly after they have been diagnosed with diabetes.  The Tdap (tetanus, diphtheria, and pertussis) vaccine should be given:  According to normal childhood vaccination schedules, for children.  Every 10 years, for adults who have diabetes. Diabetes self-management education  Education is recommended at diagnosis and ongoing as needed. Treatment plan  Your treatment plan is reviewed at every medical visit.   This information is not intended to replace advice given to you by your health care provider. Make sure you discuss any questions you have with your health care provider.   Document Released: 04/25/2009 Document Revised: 07/19/2014 Document Reviewed: 11/28/2012 Elsevier Interactive Patient Education 2016 [place] for Eating Away From Home If You Have Diabetes Controlling your level of blood glucose, also known as blood sugar, can be challenging. It can be even more difficult when you do not prepare your own meals. The following tips can help you manage your diabetes when you eat away from home. PLANNING AHEAD Plan ahead if you know you will be eating away from home:  Ask your health care provider how to time meals and medicine if you are taking insulin.  Make a list of restaurants near you that offer healthy choices. If they have a carry-out menu, take it home and plan what you will order ahead of time.  Look up the restaurant you want to eat at online. Many chain and fast-food restaurants list nutritional information online. Use this information to choose the healthiest  options and to calculate how many carbohydrates will be in your meal.  Use a carbohydrate-counting book or mobile app to look up the carbohydrate content and serving size of the foods you want to eat.  Become familiar with serving sizes and learn to recognize how many servings are in a portion. This will allow you to estimate how many carbohydrates you can eat. FREE FOODS A "free food" is any food or drink that has less than 5 g  of carbohydrates per serving. Free foods include:  Many vegetables.  Hard boiled eggs.  Nuts or seeds.  Olives.  Cheeses.  Meats. These types of foods make good appetizer choices and are often available at salad bars. Lemon juice, vinegar, or a low-calorie salad dressing of fewer than 20 calories per serving can be used as a "free" salad dressing.  CHOICES TO REDUCE CARBOHYDRATES  Substitute nonfat sweetened yogurt with a sugar-free yogurt. Yogurt made from soy milk may also be used, but you will still want a sugar-free or plain option to choose a lower carbohydrate amount.  Ask your server to take away the bread basket or chips from your table.  Order fresh fruit. A salad bar often offers fresh fruit choices. Avoid canned fruit because it is usually packed in sugar or syrup.  Order a salad, and eat it without dressing. Or, create a "free" salad dressing.  Ask for substitutions. For example, instead of [place] fries, request an order of a vegetable such as salad, green beans, or broccoli. OTHER TIPS   If you take insulin, take the insulin once your food arrives to your table. This will ensure your insulin and food are timed correctly.  Ask your server about the portion size before your order, and ask for a take-out box if the portion has more servings than you should have. When your food comes, leave the amount you should have on the plate, and put the rest in the take-out box.  Consider splitting an entree with someone and ordering a side salad.   This  information is not intended to replace advice given to you by your health care provider. Make sure you discuss any questions you have with your health care provider.   Document Released: 06/28/2005 Document Revised: 03/19/2015 Document Reviewed: 09/25/2013 Elsevier Interactive Patient Education 2016 Elsevier Inc[employer] Type 2 Diabetes Mellitus, Adult Type 2 diabetes mellitus, often simply referred to as type 2 diabetes, is a long-lasting (chronic) disease. In type 2 diabetes, the pancreas does not make enough insulin (a hormone), the cells are less responsive to the insulin that is made (insulin resistance), or both. Normally, insulin moves sugars from food into the tissue cells. The tissue cells use the sugars for energy. The lack of insulin or the lack of normal response to insulin causes excess sugars to build up in the blood instead of going into the tissue cells. As a result, high blood sugar (hyperglycemia) develops. The effect of high sugar (glucose) levels can cause many complications. Type 2 diabetes was also previously called adult-onset diabetes, but it can occur at any age.  RISK FACTORS  A person is predisposed to developing type 2 diabetes if someone in the family has the disease and also has one or more of the following primary risk factors:  Weight gain, or being overweight or obese.  An inactive lifestyle.  A history of consistently eating high-calorie foods. Maintaining a normal weight and regular physical activity can reduce the chance of developing type 2 diabetes. SYMPTOMS  A person with type 2 diabetes may not show symptoms initially. The symptoms of type 2 diabetes appear slowly. The symptoms include:  Increased thirst (polydipsia).  Increased urination (polyuria).  Increased urination during the night (nocturia).  Sudden or unexplained weight changes.  Frequent, recurring infections.  Tiredness (fatigue).  Weakness.  Vision changes, such as blurred  vision.  Fruity smell to your breath.  Abdominal pain.  Nausea or vomiting.  Cuts or bruises which are slow to  heal.  Tingling or numbness in the hands or feet.  An open skin wound (ulcer). DIAGNOSIS Type 2 diabetes is frequently not diagnosed until complications of diabetes are present. Type 2 diabetes is diagnosed when symptoms or complications are present and when blood glucose levels are increased. Your blood glucose level may be checked by one or more of the following blood tests:  A fasting blood glucose test. You will not be allowed to eat for at least 8 hours before a blood sample is taken.  A random blood glucose test. Your blood glucose is checked at any time of the day regardless of when you ate.  A hemoglobin A1c blood glucose test. A hemoglobin A1c test provides information about blood glucose control over the previous 3 months.  An oral glucose tolerance test (OGTT). Your blood glucose is measured after you have not eaten (fasted) for 2 hours and then after you drink a glucose-containing beverage. TREATMENT   You may need to take insulin or diabetes medicine daily to keep blood glucose levels in the desired range.  If you use insulin, you may need to adjust the dosage depending on the carbohydrates that you eat with each meal or snack.  Lifestyle changes are recommended as part of your treatment. These may include:  Following an individualized diet plan developed by a nutritionist or dietitian.  Exercising daily. Your health care providers will set individualized treatment goals for you based on your age, your medicines, how long you have had diabetes, and any other medical conditions you have. Generally, the goal of treatment is to maintain the following blood glucose levels:  Before meals (preprandial): 80-130 mg/dL.  After meals (postprandial): below 180 mg/dL.  A1c: less than 6.5-7%. HOME CARE INSTRUCTIONS   Have your hemoglobin A1c level checked twice a  year.  Perform daily blood glucose monitoring as directed by your health care provider.  Monitor urine ketones when you are ill and as directed by your health care provider.  Take your diabetes medicine or insulin as directed by your health care provider to maintain your blood glucose levels in the desired range.  Never run out of diabetes medicine or insulin. It is needed every day.  If you are using insulin, you may need to adjust the amount of insulin given based on your intake of carbohydrates. Carbohydrates can raise blood glucose levels but need to be included in your diet. Carbohydrates provide vitamins, minerals, and fiber which are an essential part of a healthy diet. Carbohydrates are found in fruits, vegetables, whole grains, dairy products, legumes, and foods containing added sugars.  Eat healthy foods. You should make an appointment to see a registered dietitian to help you create an eating plan that is right for you.  Lose weight if you are overweight.  Carry a medical alert card or wear your medical alert jewelry.  Carry a 15-gram carbohydrate snack with you at all times to treat low blood glucose (hypoglycemia). Some examples of 15-gram carbohydrate snacks include:  Glucose tablets, 3 or 4.  Glucose gel, 15-gram tube.  Raisins, 2 tablespoons (24 grams).  Jelly beans, 6.  Animal crackers, 8.  Regular pop, 4 ounces (120 mL).  Gummy treats, 9.  Recognize hypoglycemia. Hypoglycemia occurs with blood glucose levels of 70 mg/dL and below. The risk for hypoglycemia increases when fasting or skipping meals, during or after intense exercise, and during sleep. Hypoglycemia symptoms can include:  Tremors or shakes.  Decreased ability to concentrate.  Sweating.  Increased heart  rate.  Headache.  Dry mouth.  Hunger.  Irritability.  Anxiety.  Restless sleep.  Altered speech or coordination.  Confusion.  Treat hypoglycemia promptly. If you are alert and  able to safely swallow, follow the 15:15 rule:  Take 15-20 grams of rapid-acting glucose or carbohydrate. Rapid-acting options include glucose gel, glucose tablets, or 4 ounces (120 mL) of fruit juice, regular soda, or low-fat milk.  Check your blood glucose level 15 minutes after taking the glucose.  Take 15-20 grams more of glucose if the repeat blood glucose level is still 70 mg/dL or below.  Eat a meal or snack within 1 hour once blood glucose levels return to normal.  Be alert to feeling very thirsty and urinating more frequently than usual, which are early signs of hyperglycemia. An early awareness of hyperglycemia allows for prompt treatment. Treat hyperglycemia as directed by your health care provider.  Engage in at least 150 minutes of moderate-intensity physical activity a week, spread over at least 3 days of the week or as directed by your health care provider. In addition, you should engage in resistance exercise at least 2 times a week or as directed by your health care provider. Try to spend no more than 90 minutes at one time inactive.  Adjust your medicine and food intake as needed if you start a new exercise or sport.  Follow your sick-day plan anytime you are unable to eat or drink as usual.  Do not use any tobacco products including cigarettes, chewing tobacco, or electronic cigarettes. If you need help quitting, ask your health care provider.  Limit alcohol intake to no more than 1 drink per day for nonpregnant women and 2 drinks per day for men. You should drink alcohol only when you are also eating food. Talk with your health care provider whether alcohol is safe for you. Tell your health care provider if you drink alcohol several times a week.  Keep all follow-up visits as directed by your health care provider. This is important.  Schedule an eye exam soon after the diagnosis of type 2 diabetes and then annually.  Perform daily skin and foot care. Examine your skin  and feet daily for cuts, bruises, redness, nail problems, bleeding, blisters, or sores. A foot exam by a health care provider should be done annually.  Brush your teeth and gums at least twice a day and floss at least once a day. Follow up with your dentist regularly.  Share your diabetes management plan with your workplace or school.  Keep your immunizations up to date. It is recommended that you receive a flu (influenza) vaccine every year. It is also recommended that you receive a pneumonia (pneumococcal) vaccine. If you are 78 years of age or older and have never received a pneumonia vaccine, this vaccine may be given as a series of two separate shots. Ask your health care provider which additional vaccines may be recommended.  Learn to manage stress.  Obtain ongoing diabetes education and support as needed.  Participate in or seek rehabilitation as needed to maintain or improve independence and quality of life. Request a physical or occupational therapy referral if you are having foot or hand numbness, or difficulties with grooming, dressing, eating, or physical activity. SEEK MEDICAL CARE IF:   You are unable to eat food or drink fluids for more than 6 hours.  You have nausea and vomiting for more than 6 hours.  Your blood glucose level is over 240 mg/dL.  There is  a change in mental status.  You develop an additional serious illness.  You have diarrhea for more than 6 hours.  You have been sick or have had a fever for a couple of days and are not getting better.  You have pain during any physical activity.  SEEK IMMEDIATE MEDICAL CARE IF:  You have difficulty breathing.  You have moderate to large ketone levels.   This information is not intended to replace advice given to you by your health care provider. Make sure you discuss any questions you have with your health care provider.   Document Released: 06/28/2005 Document Revised: 03/19/2015 Document Reviewed:  01/25/2012 Elsevier Interactive Patient Education [employer].

## 2015-04-28 NOTE — Telephone Encounter (Signed)
Pt needs new rxs accu check nano glucometer with test strips and lancet sent to [facility] order pharm . Pt needs enough supplies for 90 day supply

## 2015-04-28 NOTE — Progress Notes (Signed)
Patient ID: [name], male   DOB: March 20, 1946, 69 y.o.   MRN: [id number]  Subjective:    Patient ID: [name], male    DOB: 23-Jun-1946, 69 y.o.   MRN: [id number]  HPI 34 -year-old patient who is seen today for a preventive health examination    CC: cpx - doing well.  History of Present Illness:    69 y/o is in today for a preventive health examination He is followed by cardiology and has a history of complete heart block status post pacemaker insertion;  history of coronary artery disease and aortic valve replacement. Surgery. He has done quite well. Denies any cardio pulmonary complaints. He remains on chronic Coumadin therapy and followed in  the Coumadin clinic. He is on statin therapy; this has been switched to atorvastatin Patient has a history of impaired glucose tolerance.  Recent laboratory studies revealed a fasting blood sugar greater than 200 and hemoglobin A1c 9.0  Allergies:  1) ! Lopressor (Metoprolol Tartrate)  2) Codeine Phosphate (Codeine Phosphate)   Past History:  Past Medical History:   HYPERLIPIDEMIA (ICD-272.4)  HYPERTENSION (ICD-401.9)  CORONARY ARTERY DISEASE (ICD-414.00)  CABG... 2005  Bental procedure... August,.... 2005...( aVR in aortic root conduit)  Aortic stenosis... treated surgically  PACEMAKER (ICD-V45..01).Marland KitchenMarland KitchenAugust, 2005... Dr. Lovena Le  Carotid artery disease minimal... Doppler... August 2 005  AV BLOCK, COMPLETE (ICD-426.0)  HEMORRHOIDS, INTERNAL (ICD-455.0)  ENCOUNTER FOR THERAPEUTIC DRUG MONITORING (ICD-V58.83)  SPECIAL SCREENING MALIGNANT NEOPLASM OF PROSTATE (ICD-V76.44)  DIVERTICULOSIS, COLON (ICD-562.10)  GOUT (ICD-274.9)   Past Surgical History:   Coronary artery bypass graft August 2005  Aortic valve repair August 2005  Permanent pacemaker - [employer]. Lovena Le, M.D. - 02/25/2004 - complete heart block  Circumcision  Shoulder surgery  status post PCI with stenting December 2004  colonoscopy February 2008, upper  panendoscopy, February 2008   Family History:   thefather at age 41 MI and also had renal cancer  mother's health unknown  one brother history type 2 diabetes  Positive for hypertension, total two brothers and 3 sisters  died age 57 (one brother)   Social History:   Full Time  Married  Tobacco Use - Former. 1970  Alcohol Use - no   1. Risk factors, based on past  M,S,F history-  cardiovascular risk factors include hypertension and dyslipidemia. The patient is status post CABG  2.  Physical activities: No exercise restrictions although fairly sedentary. 3.  Depression/mood: No depression or mood disorder  4.  Hearing: No significant deficits  5.  ADL's: Independent in all aspects of daily living  6.  Fall risk: Low  7.  Home safety: No problems identified  8.  Height weight, and visual acuity; height and weight stable weight is 252. No visual complaints  9.  Counseling: More regular exercise heart healthy diet and weight loss all encouraged  10. Lab orders based on risk factors: Laboratory studies reviewed. Impaired glucose tolerance unchanged slight worsening of dyslipidemia. Last issues addressed  11. Referral : Followup cardiology. Continue monthly INR monitoring  12. Care plan: Monthly INR monitoring weight loss regular exercise all recommended  13. Cognitive assessment: Alert and oriented normal affect. No cognitive dysfunction  14.  Preventive services-followup colonoscopy in 2 years.  Serial carotid artery ultrasounds  15.  Provider.  Update- followup cardiology.  GI followup in 2 years     Review of Systems  Constitutional: Negative for fever, chills, appetite change and fatigue.  HENT: Negative for congestion, dental problem, ear  pain, hearing loss, sore throat, tinnitus, trouble swallowing and voice change.   Eyes: Negative for pain, discharge and visual disturbance.  Respiratory: Negative for cough, chest tightness, wheezing and stridor.    Cardiovascular: Negative for chest pain, palpitations and leg swelling.  Gastrointestinal: Negative for nausea, vomiting, abdominal pain, diarrhea, constipation, blood in stool and abdominal distention.  Genitourinary: Negative for urgency, hematuria, flank pain, discharge, difficulty urinating and genital sores.  Musculoskeletal: Negative for myalgias, back pain, joint swelling, arthralgias, gait problem and neck stiffness.  Skin: Negative for rash.  Neurological: Negative for dizziness, syncope, speech difficulty, weakness, numbness and headaches.  Hematological: Negative for adenopathy. Does not bruise/bleed easily.  Psychiatric/Behavioral: Negative for behavioral problems and dysphoric mood. The patient is not nervous/anxious.        Objective:   Physical Exam  Constitutional: He appears well-developed and well-nourished.  Weight 246 Blood pressure 120/68  HENT:  Head: Normocephalic and atraumatic.  Right Ear: External ear normal.  Left Ear: External ear normal.  Nose: Nose normal.  Mouth/Throat: Oropharynx is clear and moist.  Eyes: Conjunctivae and EOM are normal. Pupils are equal, round, and reactive to light. No scleral icterus.  Neck: Normal range of motion. Neck supple. No JVD present. No thyromegaly present.  Brief right carotid bruit  Cardiovascular: Normal rate, regular rhythm and intact distal pulses.  Exam reveals no gallop and no friction rub.   No murmur heard. Prosthetic heart sounds without significant murmur  Dorsalis pedis pulses full.  Posterior tibial pulses faint   Pulmonary/Chest: Effort normal and breath sounds normal. He exhibits no tenderness.  Abdominal: Soft. Bowel sounds are normal. He exhibits no distension and no mass. There is no tenderness.  Genitourinary: Rectum normal and penis normal. Guaiac negative stool.  Prostate +2 enlarged  Musculoskeletal: Normal range of motion. He exhibits no edema or tenderness.  Lymphadenopathy:    He has no  cervical adenopathy.  Neurological: He is alert. He has normal reflexes. No cranial nerve deficit. Coordination normal.  Skin: Skin is warm and dry. No rash noted.  Psychiatric: He has a normal mood and affect. His behavior is normal.          Assessment & Plan:   Preventive health examination Coronary artery disease status post CABG Status post aortic valve repair on chronic Coumadin anticoagulation Status post AV block status post permanent pacemaker insertion  diabetes mellitus.  New onset.  Patient will be placed on metformin therapy.  Lifestyle issues discussed.  Will start home blood sugar monitoring Recheck 6 weeks  Obesity Hypertension Dyslipidemia  Lifestyle issues addressed.  We'll encourage weight loss and more vigorous exercise regimen

## 2015-04-29 ENCOUNTER — Encounter: Payer: Self-pay | Admitting: Internal Medicine

## 2015-04-29 ENCOUNTER — Other Ambulatory Visit: Payer: Self-pay | Admitting: Internal Medicine

## 2015-04-29 NOTE — Telephone Encounter (Signed)
Left message on voicemail to call office.  

## 2015-04-30 ENCOUNTER — Telehealth: Payer: Self-pay | Admitting: Internal Medicine

## 2015-04-30 MED ORDER — ACCU-CHEK FASTCLIX LANCETS MISC: Status: DC

## 2015-04-30 MED ORDER — GLUCOSE BLOOD VI STRP: ORAL_STRIP | Status: DC

## 2015-04-30 NOTE — Telephone Encounter (Signed)
Pt return your call Butch Penny ad would like a call back

## 2015-04-30 NOTE — Telephone Encounter (Signed)
See other message

## 2015-04-30 NOTE — Telephone Encounter (Signed)
Spoke to pt's wife Mel Almond, asked her how many times a day is pt suppose to test sugar. Mel Almond said twice a day. Told her okay I will send Rx's for test strips and lancets to [facility]. Jeanetta verbalized understanding.

## 2015-05-01 ENCOUNTER — Ambulatory Visit (INDEPENDENT_AMBULATORY_CARE_PROVIDER_SITE_OTHER): Payer: Commercial Managed Care - HMO | Admitting: Pharmacist

## 2015-05-01 DIAGNOSIS — I359 Nonrheumatic aortic valve disorder, unspecified: Secondary | ICD-10-CM

## 2015-05-01 DIAGNOSIS — Z5181 Encounter for therapeutic drug level monitoring: Secondary | ICD-10-CM

## 2015-05-01 DIAGNOSIS — Z7901 Long term (current) use of anticoagulants: Secondary | ICD-10-CM

## 2015-05-01 DIAGNOSIS — Z954 Presence of other heart-valve replacement: Secondary | ICD-10-CM

## 2015-05-01 DIAGNOSIS — Z952 Presence of prosthetic heart valve: Secondary | ICD-10-CM

## 2015-05-01 LAB — POCT INR: INR: 3.2

## 2015-05-02 ENCOUNTER — Encounter: Payer: Commercial Managed Care - HMO | Attending: Internal Medicine | Admitting: Dietician

## 2015-05-02 ENCOUNTER — Encounter: Payer: Self-pay | Admitting: Dietician

## 2015-05-02 VITALS — Ht 66.25 in | Wt 247.0 lb

## 2015-05-02 DIAGNOSIS — IMO0002 Reserved for concepts with insufficient information to code with codable children: Secondary | ICD-10-CM

## 2015-05-02 DIAGNOSIS — Z713 Dietary counseling and surveillance: Secondary | ICD-10-CM | POA: Insufficient documentation

## 2015-05-02 DIAGNOSIS — E1165 Type 2 diabetes mellitus with hyperglycemia: Secondary | ICD-10-CM | POA: Insufficient documentation

## 2015-05-02 DIAGNOSIS — E118 Type 2 diabetes mellitus with unspecified complications: Secondary | ICD-10-CM

## 2015-05-02 NOTE — Progress Notes (Signed)
Diabetes Self-Management Education  Visit Type: First/Initial  Appt. Start Time: 1000 Appt. End Time: 1100  05/02/2015  [name], identified by name and date of birth, is a 69 y.o. male with a diagnosis of Diabetes: Type 2 (diagnosed 04/25/15).  Patient is here with his wife.  He is a retired Development worker, community.  Hx includes CABG, AVR, pacemaker, and aneurism repair.  Mediations include Coumadin, Glucotrol XL and Glucophage.  He was taught how to use the meter today and CBG was 229.  He started his medication this am.  Both shop and wife cooks.  He has already made diet changes by reducing refined sugars and carbohydrate portions.  ASSESSMENT  Height 5' 6.25" (1.683 m), weight 247 lb (112.038 kg). Body mass index is 39.55 kg/(m^2).      Diabetes Self-Management Education - 05/02/15 1009    Visit Information   Visit Type First/Initial   Initial Visit   Diabetes Type Type 2   Are you currently following a meal plan? No   Are you taking your medications as prescribed? Yes   Date Diagnosed 04/25/15   Health Coping   How would you rate your overall health? Good   Psychosocial Assessment   Patient Belief/Attitude about Diabetes Motivated to manage diabetes   Self-care barriers None   Self-management support Doctor's office;Family   Other persons present Patient;Spouse/SO   Patient Concerns Weight Control;Glycemic Control;Nutrition/Meal planning   Special Needs None   Preferred Learning Style No preference indicated   Learning Readiness Ready   What is the last grade level you completed in school? 4 years HS   Complications   Last HgB A1C per patient/outside source 9 %  04/25/15   How often do you check your blood sugar? 0 times/day (not testing)   Have you had a dilated eye exam in the past 12 months? Yes   Have you had a dental exam in the past 12 months? Yes   Are you checking your feet? No   Dietary Intake   Breakfast eggbeaters, oatmeal, 1 slice [place] bacon, 6 oz OJ, 2  cups coffee with sweet and low and nondairy creamer  8   Snack (morning) fruit   Lunch leftovers  12-1   Snack (afternoon) yogurt (low carb), peanuts   Dinner rotisorie chicken, squash caserole   Snack (evening) none   Beverage(s) diet Mt. Dew, diet green tea, water   Exercise   Exercise Type Light (walking / raking leaves)  knee pain   How many days per week to you exercise? 2   How many minutes per day do you exercise? 1   Total minutes per week of exercise 2   Patient Education   Previous Diabetes Education No   Disease state  Definition of diabetes, type 1 and 2, and the diagnosis of diabetes;Factors that contribute to the development of diabetes   Nutrition management  Role of diet in the treatment of diabetes and the relationship between the three main macronutrients and blood glucose level;Food label reading, portion sizes and measuring food.;Meal options for control of blood glucose level and chronic complications.   Physical activity and exercise  Role of exercise on diabetes management, blood pressure control and cardiac health.;Helped patient identify appropriate exercises in relation to his/her diabetes, diabetes complications and other health issue.   Medications Reviewed patients medication for diabetes, action, purpose, timing of dose and side effects.   Monitoring Taught/evaluated SMBG meter.;Purpose and frequency of SMBG.;Identified appropriate SMBG and/or A1C goals.;Taught/discussed recording of  test results and interpretation of SMBG.;Yearly dilated eye exam;Daily foot exams   Chronic complications Relationship between chronic complications and blood glucose control;Assessed and discussed foot care and prevention of foot problems;Retinopathy and reason for yearly dilated eye exams;Reviewed with patient heart disease, higher risk of, and prevention   Psychosocial adjustment Worked with patient to identify barriers to care and solutions;Role of stress on diabetes    Individualized Goals (developed by patient)   Nutrition Follow meal plan discussed;General guidelines for healthy choices and portions discussed   Physical Activity Exercise 5-7 days per week;30 minutes per day   Monitoring  test my blood glucose as discussed   Reducing Risk examine blood glucose patterns;do foot checks daily   Outcomes   Expected Outcomes Demonstrated interest in learning. Expect positive outcomes   Future DMSE 4-6 wks   Program Status Completed      Individualized Plan for Diabetes Self-Management Training:   Learning Objective:  Patient will have a greater understanding of diabetes self-management. Patient education plan is to attend individual and/or group sessions per assessed needs and concerns.   Plan:   Patient Instructions  Plan:  Stop eating when you are full. Consider silver sneakers program Low salt tomato juice rather than orange juice. Aim for 3 Carb Choices per meal (45 grams) +/- 1 either way  Aim for 0-1 Carbs per snack if hungry  Include protein in moderation with your meals and snacks Consider reading food labels for Total Carbohydrate and Fat Grams of foods Consider  increasing your activity level by walking/biking/yard work for 30 minutes daily as tolerated Consider checking BG at alternate times per day as directed by MD  Consider taking medication as directed by MD      Expected Outcomes:  Demonstrated interest in learning. Expect positive outcomes  Education material provided: Living Well with Diabetes, Food label handouts, A1C conversion sheet, Meal plan card, My Plate and Snack sheet  If problems or questions, patient to contact team via:  Phone and Email  Future DSME appointment: prn

## 2015-05-02 NOTE — Patient Instructions (Addendum)
Plan:  Stop eating when you are full. Consider silver sneakers program Low salt tomato juice rather than orange juice. Aim for 3 Carb Choices per meal (45 grams) +/- 1 either way  Aim for 0-1 Carbs per snack if hungry  Include protein in moderation with your meals and snacks Consider reading food labels for Total Carbohydrate and Fat Grams of foods Consider  increasing your activity level by walking/biking/yard work for 30 minutes daily as tolerated Consider checking BG at alternate times per day as directed by MD  Consider taking medication as directed by MD

## 2015-05-05 DIAGNOSIS — H5200 Hypermetropia, unspecified eye: Secondary | ICD-10-CM | POA: Diagnosis not present

## 2015-05-05 DIAGNOSIS — I10 Essential (primary) hypertension: Secondary | ICD-10-CM | POA: Diagnosis not present

## 2015-05-05 DIAGNOSIS — H5210 Myopia, unspecified eye: Secondary | ICD-10-CM | POA: Diagnosis not present

## 2015-05-05 DIAGNOSIS — E109 Type 1 diabetes mellitus without complications: Secondary | ICD-10-CM | POA: Diagnosis not present

## 2015-05-05 LAB — HM DIABETES EYE EXAM

## 2015-05-13 ENCOUNTER — Telehealth: Payer: Self-pay | Admitting: Internal Medicine

## 2015-05-13 NOTE — Telephone Encounter (Signed)
Wife would like you to call her,  donna to discuss pt's DM meds.

## 2015-05-13 NOTE — Telephone Encounter (Signed)
Spoke to pt's wife Mel Almond, she said pt's blood sugar was 93 yesterday prior to dinner and he did not take his Metformin. Asked her how his sugars have been running. Mel Almond said prior to dinner 128-113. So this is the first time it was 25? Jeanetta said yes. Told her to continue to monitior sugars if he gets 90 or below again or starts having symptoms like dizziness please call office. Jeanetta verbalized understanding.

## 2015-05-15 ENCOUNTER — Encounter: Payer: Self-pay | Admitting: Internal Medicine

## 2015-05-22 ENCOUNTER — Ambulatory Visit (INDEPENDENT_AMBULATORY_CARE_PROVIDER_SITE_OTHER): Payer: Commercial Managed Care - HMO | Admitting: *Deleted

## 2015-05-22 DIAGNOSIS — Z7901 Long term (current) use of anticoagulants: Secondary | ICD-10-CM | POA: Diagnosis not present

## 2015-05-22 DIAGNOSIS — Z5181 Encounter for therapeutic drug level monitoring: Secondary | ICD-10-CM | POA: Diagnosis not present

## 2015-05-22 DIAGNOSIS — I359 Nonrheumatic aortic valve disorder, unspecified: Secondary | ICD-10-CM

## 2015-05-22 DIAGNOSIS — Z954 Presence of other heart-valve replacement: Secondary | ICD-10-CM | POA: Diagnosis not present

## 2015-05-22 DIAGNOSIS — Z952 Presence of prosthetic heart valve: Secondary | ICD-10-CM

## 2015-05-22 LAB — POCT INR: INR: 2.5

## 2015-05-23 ENCOUNTER — Other Ambulatory Visit: Payer: Self-pay

## 2015-05-23 DIAGNOSIS — M25569 Pain in unspecified knee: Secondary | ICD-10-CM

## 2015-05-28 DIAGNOSIS — M170 Bilateral primary osteoarthritis of knee: Secondary | ICD-10-CM | POA: Diagnosis not present

## 2015-06-09 ENCOUNTER — Encounter: Payer: Self-pay | Admitting: Internal Medicine

## 2015-06-09 ENCOUNTER — Ambulatory Visit (INDEPENDENT_AMBULATORY_CARE_PROVIDER_SITE_OTHER): Payer: Commercial Managed Care - HMO | Admitting: Internal Medicine

## 2015-06-09 VITALS — BP 120/78 | HR 65 | Temp 97.9°F | Resp 20 | Ht 66.25 in | Wt 239.0 lb

## 2015-06-09 DIAGNOSIS — E119 Type 2 diabetes mellitus without complications: Secondary | ICD-10-CM | POA: Diagnosis not present

## 2015-06-09 DIAGNOSIS — I10 Essential (primary) hypertension: Secondary | ICD-10-CM

## 2015-06-09 DIAGNOSIS — E785 Hyperlipidemia, unspecified: Secondary | ICD-10-CM

## 2015-06-09 DIAGNOSIS — I2510 Atherosclerotic heart disease of native coronary artery without angina pectoris: Secondary | ICD-10-CM

## 2015-06-09 NOTE — Progress Notes (Signed)
Pre visit review using our clinic review tool, if applicable. No additional management support is needed unless otherwise documented below in the visit note. 

## 2015-06-09 NOTE — Patient Instructions (Signed)
Hold glipizide.  If blood sugars are consistently less than 70  Limit your sodium (Salt) intake    It is important that you exercise regularly, at least 20 minutes 3 to 4 times per week.  If you develop chest pain or shortness of breath seek  medical attention.   Please check your hemoglobin A1c every 3 months

## 2015-06-09 NOTE — Progress Notes (Signed)
Subjective:    Patient ID: [name], male    DOB: 13-Sep-1945, 69 y.o.   MRN: [id number]  HPI 69 year old patient who is seen today for follow-up of newly diagnosed diabetes.  He was noted have a random blood sugar greater than 200 and hemoglobin A1c of 9.0.  He has received diabetic instructions including diet and has been not very active.  There is been some modest weight loss  Wt Readings from Last 3 Encounters:  06/09/15 239 lb (108.41 kg)  05/02/15 247 lb (112.038 kg)  04/28/15 246 lb (111.585 kg)    He feels quite well.  Blood sugars have been trending down  Past Medical History  Diagnosis Date  . Aortic stenosis 08/25/2010    a. [place] aortic root replacement with a St. Jude mechanical valve and Hemashield conduit 02/2004.  . AV BLOCK, COMPLETE     a. s/p St Jude dual chamber pacemaker 02/2004.  Marland Kitchen CAD (coronary artery disease)     a. s/p CABGx2 (LIMA-dLAD, SVG-Cx). b. Low risk nuc 12/2012 without ischemia, EF 46% mild apical hypokinesia (EF 55% inf HK by echo).  . DIVERTICULOSIS, COLON 10/17/2007  . GOUT 01/16/2007  . HEMORRHOIDS, INTERNAL 11/01/2008  . HYPERLIPIDEMIA 01/16/2007  . HYPERTENSION 01/16/2007  . LEG EDEMA 11/27/2008  . Impaired glucose tolerance   . Carotid artery disease (HCC)     a. 0-39% bilateral ICA stenosis, stable mild hard plaque in carotid bulbs. F/u 03/2014 recommended.  Marland Kitchen Special screening for malignant neoplasm of prostate   . Warfarin anticoagulation     AVR  . Ejection fraction     a. EF 55% with inf HK, mild MR by echo 12/2012.  . Asthma   . Arthritis   . GERD (gastroesophageal reflux disease)     hxof   . Diabetes mellitus without complication [facility])     Social History   Social History  . Marital Status: Married    Spouse Name: N/A  . Number of Children: N/A  . Years of Education: N/A   Occupational History  . Not on file.   Social History Main Topics  . Smoking status: Former Smoker    Quit date: 07/12/1973  . Smokeless  tobacco: Never Used  . Alcohol Use: No  . Drug Use: No  . Sexual Activity: No   Other Topics Concern  . Not on file   Social History Narrative    Past Surgical History  Procedure Laterality Date  . Coronary artery bypass graft    . Aortic valve replacement    . Pacemaker insertion    . Shoulder surgery    . Cardiac catheterization  06/2003  . Circumcision    . Colonoscopy    . Hernia repair    . Coronary stents       prior to bypass   . Inguinal hernia repair Bilateral 02/07/2013    Procedure: LAPAROSCOPIC BILATERAL INGUINAL HERNIA REPAIR;  Surgeon: Imogene Burn. Georgette Dover, MD;  Location: [facility] ORS;  Service: General;  Laterality: Bilateral;  . Insertion of mesh Bilateral 02/07/2013    Procedure: INSERTION OF MESH;  Surgeon: Imogene Burn. Georgette Dover, MD;  Location: [facility];  Service: General;  Laterality: Bilateral;    Family History  Problem Relation Age of Onset  . Heart attack Father   . Cancer Father     renal cancer  . Diabetes Brother   . Hypertension Mother   . Diabetes Mother     Allergies  Allergen Reactions  .  Codeine Phosphate Hives, Shortness Of Breath, Itching, Rash and Other (See Comments)    REACTION: difficulty breathing  . Metoprolol Tartrate Other (See Comments)    REACTION: loss of vision. Can take the XL tablet not regular   . Ramipril Cough    Current Outpatient Prescriptions on File Prior to Visit  Medication Sig Dispense Refill  . ACCU-CHEK FASTCLIX LANCETS MISC USE TO CHECK BLOOD SUGAR TWICE A DAY AND PRN 302 each 3  . acetaminophen (TYLENOL) 325 MG tablet Take 2 tablets (650 mg total) by mouth every 6 (six) hours as needed. (Patient taking differently: Take 650 mg by mouth every 6 (six) hours as needed for mild pain. )    . Ascorbic Acid (VITAMIN C) 500 MG tablet Take 1,000 mg by mouth daily.     Marland Kitchen aspirin 81 MG tablet Take 81 mg by mouth daily.      Marland Kitchen atorvastatin (LIPITOR) 40 MG tablet TAKE 1 TABLET EVERY DAY  (REPLACES  PRAVASTATIN) 90 tablet 3  . calcium  carbonate (OS-CAL) 600 MG TABS Take 600 mg by mouth 2 (two) times daily with a meal.    . cetirizine (ZYRTEC) 10 MG tablet Take 20 mg by mouth daily.    . furosemide (LASIX) 40 MG tablet Take 1 tablet (40 mg total) by mouth 2 (two) times daily. 180 tablet 1  . glipiZIDE (GLUCOTROL XL) 2.5 MG 24 hr tablet Take 1 tablet (2.5 mg total) by mouth daily with breakfast. 90 tablet 3  . Glucosamine-Chondroitin 250-200 MG CAPS Take 2 capsules by mouth daily.     Marland Kitchen glucose blood (ACCU-CHEK SMARTVIEW) test strip USE TO CHECK BLOOD SUGAR TWICE A DAY AND PRN 300 each 3  . guaiFENesin-dextromethorphan (ROBITUSSIN DM) 100-10 MG/5ML syrup Take 5 mLs by mouth every 4 (four) hours as needed for cough.    . hydrocortisone 2.5 % lotion Apply topically 2 (two) times daily. 59 mL 0  . losartan (COZAAR) 100 MG tablet Take 1 tablet (100 mg total) by mouth daily. 90 tablet 3  . Meclizine HCl (BONINE) 25 MG CHEW Chew 1 tablet by mouth daily.      . metFORMIN (GLUCOPHAGE) 1000 MG tablet Take 1 tablet (1,000 mg total) by mouth 2 (two) times daily with a meal. 180 tablet 3  . metoprolol succinate (TOPROL-XL) 100 MG 24 hr tablet TAKE 1 TABLET EVERY MORNING.  TAKE WITH OR IMMEDIATELY FOLLOWING A MEAL 90 tablet 1  . Misc Natural Products (TART CHERRY ADVANCED) CAPS Take 2 capsules by mouth daily.     . Multiple Vitamin (MULTIVITAMIN) tablet Take 1 tablet by mouth daily.      . nitroGLYCERIN (NITROSTAT) 0.4 MG SL tablet Place 1 tablet (0.4 mg total) under the tongue every 5 (five) minutes as needed. For chest pain 25 tablet prn  . Omega-3 Fatty Acids (FISH OIL) 1000 MG CAPS Take 1,000 mg by mouth 2 (two) times daily.     . potassium chloride (K-DUR,KLOR-CON) 10 MEQ tablet Take 1 tablet (10 mEq total) by mouth 2 (two) times daily. 180 tablet 3  . tadalafil (CIALIS) 10 MG tablet Take 40 mg by mouth daily as needed for erectile dysfunction.     . traMADol (ULTRAM) 50 MG tablet Take 50 mg by mouth every 6 (six) hours as needed for  pain.     . vitamin B-12 (CYANOCOBALAMIN) 1000 MCG tablet Take 1,000 mcg by mouth daily.    Marland Kitchen warfarin (COUMADIN) 10 MG tablet TAKE AS DIRECTED BY COUMADIN  CLINIC 90 tablet 3  . [DISCONTINUED] potassium chloride (KLOR-CON 10) 10 MEQ CR tablet Take 1 tablet (10 mEq total) by mouth 2 (two) times daily. 180 tablet 6   No current facility-administered medications on file prior to visit.    BP 120/78 mmHg  Pulse 65  Temp(Src) 97.9 F (36.6 C) (Oral)  Resp 20  Ht 5' 6.25" (1.683 m)  Wt 239 lb (108.41 kg)  BMI 38.27 kg/m2  SpO2 98%    Review of Systems  Constitutional: Negative for fever, chills, appetite change and fatigue.  HENT: Negative for congestion, dental problem, ear pain, hearing loss, sore throat, tinnitus, trouble swallowing and voice change.   Eyes: Negative for pain, discharge and visual disturbance.  Respiratory: Negative for cough, chest tightness, wheezing and stridor.   Cardiovascular: Negative for chest pain, palpitations and leg swelling.  Gastrointestinal: Negative for nausea, vomiting, abdominal pain, diarrhea, constipation, blood in stool and abdominal distention.  Genitourinary: Negative for urgency, hematuria, flank pain, discharge, difficulty urinating and genital sores.  Musculoskeletal: Negative for myalgias, back pain, joint swelling, arthralgias, gait problem and neck stiffness.  Skin: Negative for rash.  Neurological: Negative for dizziness, syncope, speech difficulty, weakness, numbness and headaches.  Hematological: Negative for adenopathy. Does not bruise/bleed easily.  Psychiatric/Behavioral: Negative for behavioral problems and dysphoric mood. The patient is not nervous/anxious.        Objective:   Physical Exam  Constitutional: He appears well-developed and well-nourished. No distress.  Blood pressure 120/78 Weight 239          Assessment & Plan:  Diabetes mellitus.  Improved glycemic control No change in therapy Glipizide will be held  if blood sugars are less than 70 on a regular basis or there is any frank hypoglycemia  Recheck 3 months with hemoglobin A1c

## 2015-06-19 ENCOUNTER — Ambulatory Visit (INDEPENDENT_AMBULATORY_CARE_PROVIDER_SITE_OTHER): Payer: Commercial Managed Care - HMO | Admitting: *Deleted

## 2015-06-19 DIAGNOSIS — Z954 Presence of other heart-valve replacement: Secondary | ICD-10-CM | POA: Diagnosis not present

## 2015-06-19 DIAGNOSIS — Z7901 Long term (current) use of anticoagulants: Secondary | ICD-10-CM | POA: Diagnosis not present

## 2015-06-19 DIAGNOSIS — Z5181 Encounter for therapeutic drug level monitoring: Secondary | ICD-10-CM | POA: Diagnosis not present

## 2015-06-19 DIAGNOSIS — I359 Nonrheumatic aortic valve disorder, unspecified: Secondary | ICD-10-CM

## 2015-06-19 DIAGNOSIS — Z952 Presence of prosthetic heart valve: Secondary | ICD-10-CM

## 2015-06-19 LAB — POCT INR: INR: 2

## 2015-06-25 DIAGNOSIS — I442 Atrioventricular block, complete: Secondary | ICD-10-CM | POA: Diagnosis not present

## 2015-06-30 ENCOUNTER — Other Ambulatory Visit: Payer: Self-pay | Admitting: Internal Medicine

## 2015-07-08 ENCOUNTER — Other Ambulatory Visit: Payer: Self-pay | Admitting: Internal Medicine

## 2015-07-17 ENCOUNTER — Ambulatory Visit (INDEPENDENT_AMBULATORY_CARE_PROVIDER_SITE_OTHER): Payer: Commercial Managed Care - HMO | Admitting: *Deleted

## 2015-07-17 DIAGNOSIS — Z7901 Long term (current) use of anticoagulants: Secondary | ICD-10-CM

## 2015-07-17 DIAGNOSIS — I359 Nonrheumatic aortic valve disorder, unspecified: Secondary | ICD-10-CM | POA: Diagnosis not present

## 2015-07-17 DIAGNOSIS — Z954 Presence of other heart-valve replacement: Secondary | ICD-10-CM | POA: Diagnosis not present

## 2015-07-17 DIAGNOSIS — Z5181 Encounter for therapeutic drug level monitoring: Secondary | ICD-10-CM

## 2015-07-17 DIAGNOSIS — Z952 Presence of prosthetic heart valve: Secondary | ICD-10-CM

## 2015-07-17 LAB — POCT INR: INR: 1.9

## 2015-07-31 ENCOUNTER — Ambulatory Visit (INDEPENDENT_AMBULATORY_CARE_PROVIDER_SITE_OTHER): Payer: Commercial Managed Care - HMO | Admitting: *Deleted

## 2015-07-31 DIAGNOSIS — Z954 Presence of other heart-valve replacement: Secondary | ICD-10-CM

## 2015-07-31 DIAGNOSIS — Z5181 Encounter for therapeutic drug level monitoring: Secondary | ICD-10-CM | POA: Diagnosis not present

## 2015-07-31 DIAGNOSIS — Z952 Presence of prosthetic heart valve: Secondary | ICD-10-CM

## 2015-07-31 DIAGNOSIS — Z7901 Long term (current) use of anticoagulants: Secondary | ICD-10-CM

## 2015-07-31 DIAGNOSIS — I359 Nonrheumatic aortic valve disorder, unspecified: Secondary | ICD-10-CM | POA: Diagnosis not present

## 2015-07-31 LAB — POCT INR: INR: 2.3

## 2015-08-28 ENCOUNTER — Ambulatory Visit (INDEPENDENT_AMBULATORY_CARE_PROVIDER_SITE_OTHER): Payer: Commercial Managed Care - HMO | Admitting: *Deleted

## 2015-08-28 DIAGNOSIS — Z952 Presence of prosthetic heart valve: Secondary | ICD-10-CM

## 2015-08-28 DIAGNOSIS — Z7901 Long term (current) use of anticoagulants: Secondary | ICD-10-CM

## 2015-08-28 DIAGNOSIS — Z954 Presence of other heart-valve replacement: Secondary | ICD-10-CM

## 2015-08-28 DIAGNOSIS — Z5181 Encounter for therapeutic drug level monitoring: Secondary | ICD-10-CM | POA: Diagnosis not present

## 2015-08-28 DIAGNOSIS — I359 Nonrheumatic aortic valve disorder, unspecified: Secondary | ICD-10-CM | POA: Diagnosis not present

## 2015-08-28 LAB — POCT INR: INR: 2.4

## 2015-09-08 DIAGNOSIS — M170 Bilateral primary osteoarthritis of knee: Secondary | ICD-10-CM | POA: Diagnosis not present

## 2015-09-09 ENCOUNTER — Other Ambulatory Visit: Payer: Self-pay | Admitting: Internal Medicine

## 2015-09-10 ENCOUNTER — Encounter: Payer: Self-pay | Admitting: Internal Medicine

## 2015-09-10 ENCOUNTER — Ambulatory Visit (INDEPENDENT_AMBULATORY_CARE_PROVIDER_SITE_OTHER): Payer: Commercial Managed Care - HMO | Admitting: Internal Medicine

## 2015-09-10 VITALS — BP 124/80 | HR 70 | Temp 98.1°F | Resp 20 | Ht 66.25 in | Wt 243.0 lb

## 2015-09-10 DIAGNOSIS — I10 Essential (primary) hypertension: Secondary | ICD-10-CM | POA: Diagnosis not present

## 2015-09-10 DIAGNOSIS — E119 Type 2 diabetes mellitus without complications: Secondary | ICD-10-CM | POA: Diagnosis not present

## 2015-09-10 LAB — HEMOGLOBIN A1C: Hgb A1c MFr Bld: 6.1 % (ref 4.6–6.5)

## 2015-09-10 NOTE — Progress Notes (Signed)
Subjective:    Patient ID: [name], male    DOB: [birth year], 70 y.o.   MRN: [id number]  HPI  70 year old patient who is seen today for follow-up of diabetes.  This is a fairly recent diagnosis and now he is on dual therapy.  Fasting blood sugars generally range from 88-95 and postprandial readings 90-100.  No hypoglycemia.  He generally feels well.  There is been some modest weight loss. He has essential hypertension. He is scheduled for cardiology follow-up soon which will include the pacemaker check. Status post aVR on chronic Coumadin anticoagulation  Past Medical History  Diagnosis Date  . Aortic stenosis 08/25/2010    a. Bentall aortic root replacement with a St. Jude mechanical valve and Hemashield conduit 02/2004.  . AV BLOCK, COMPLETE     a. s/p St Jude dual chamber pacemaker 02/2004.  Marland Kitchen CAD (coronary artery disease)     a. s/p CABGx2 (LIMA-dLAD, SVG-Cx). b. Low risk nuc 12/2012 without ischemia, EF 46% mild apical hypokinesia (EF 55% inf HK by echo).  . DIVERTICULOSIS, COLON 10/17/2007  . GOUT 01/16/2007  . HEMORRHOIDS, INTERNAL 11/01/2008  . HYPERLIPIDEMIA 01/16/2007  . HYPERTENSION 01/16/2007  . LEG EDEMA 11/27/2008  . Impaired glucose tolerance   . Carotid artery disease (HCC)     a. 0-39% bilateral ICA stenosis, stable mild hard plaque in carotid bulbs. F/u 03/2014 recommended.  Marland Kitchen Special screening for malignant neoplasm of prostate   . Warfarin anticoagulation     AVR  . Ejection fraction     a. EF 55% with inf HK, mild MR by echo 12/2012.  . Asthma   . Arthritis   . GERD (gastroesophageal reflux disease)     hxof   . Diabetes mellitus without complication [facility])     Social History   Social History  . Marital Status: Married    Spouse Name: N/A  . Number of Children: N/A  . Years of Education: N/A   Occupational History  . Not on file.   Social History Main Topics  . Smoking status: Former Smoker    Quit date: 07/12/1973  . Smokeless tobacco: Never Used    . Alcohol Use: No  . Drug Use: No  . Sexual Activity: No   Other Topics Concern  . Not on file   Social History Narrative    Past Surgical History  Procedure Laterality Date  . Coronary artery bypass graft    . Aortic valve replacement    . Pacemaker insertion    . Shoulder surgery    . Cardiac catheterization  06/2003  . Circumcision    . Colonoscopy    . Hernia repair    . Coronary stents       prior to bypass   . Inguinal hernia repair Bilateral 02/07/2013    Procedure: LAPAROSCOPIC BILATERAL INGUINAL HERNIA REPAIR;  Surgeon: Imogene Burn. Georgette Dover, MD;  Location: [facility];  Service: General;  Laterality: Bilateral;  . Insertion of mesh Bilateral 02/07/2013    Procedure: INSERTION OF MESH;  Surgeon: Imogene Burn. Georgette Dover, MD;  Location: [facility];  Service: General;  Laterality: Bilateral;    Family History  Problem Relation Age of Onset  . Heart attack Father   . Cancer Father     renal cancer  . Diabetes Brother   . Hypertension Mother   . Diabetes Mother     Allergies  Allergen Reactions  . Codeine Phosphate Hives, Shortness Of Breath, Itching, Rash and Other (See Comments)  REACTION: difficulty breathing  . Metoprolol Tartrate Other (See Comments)    REACTION: loss of vision. Can take the XL tablet not regular   . Ramipril Cough    Current Outpatient Prescriptions on File Prior to Visit  Medication Sig Dispense Refill  . ACCU-CHEK FASTCLIX LANCETS MISC USE TO CHECK BLOOD SUGAR TWICE A DAY AND PRN 302 each 3  . acetaminophen (TYLENOL) 325 MG tablet Take 2 tablets (650 mg total) by mouth every 6 (six) hours as needed. (Patient taking differently: Take 650 mg by mouth every 6 (six) hours as needed for mild pain. )    . Ascorbic Acid (VITAMIN C) 500 MG tablet Take 1,000 mg by mouth daily.     Marland Kitchen aspirin 81 MG tablet Take 81 mg by mouth daily.      Marland Kitchen atorvastatin (LIPITOR) 40 MG tablet TAKE 1 TABLET EVERY DAY  (REPLACES  PRAVASTATIN) 90 tablet 3  . calcium carbonate (OS-CAL)  600 MG TABS Take 600 mg by mouth 2 (two) times daily with a meal.    . cetirizine (ZYRTEC) 10 MG tablet Take 20 mg by mouth daily.    . furosemide (LASIX) 40 MG tablet TAKE 1 TABLET TWICE DAILY 180 tablet 1  . glipiZIDE (GLUCOTROL XL) 2.5 MG 24 hr tablet Take 1 tablet (2.5 mg total) by mouth daily with breakfast. 90 tablet 3  . Glucosamine-Chondroitin 250-200 MG CAPS Take 2 capsules by mouth daily.     Marland Kitchen glucose blood (ACCU-CHEK SMARTVIEW) test strip USE TO CHECK BLOOD SUGAR TWICE A DAY AND PRN 300 each 3  . guaiFENesin-dextromethorphan (ROBITUSSIN DM) 100-10 MG/5ML syrup Take 5 mLs by mouth every 4 (four) hours as needed for cough.    . hydrocortisone 2.5 % lotion Apply topically 2 (two) times daily. 59 mL 0  . losartan (COZAAR) 100 MG tablet Take 1 tablet (100 mg total) by mouth daily. 90 tablet 2  . Meclizine HCl (BONINE) 25 MG CHEW Chew 1 tablet by mouth daily.      . metFORMIN (GLUCOPHAGE) 1000 MG tablet Take 1 tablet (1,000 mg total) by mouth 2 (two) times daily with a meal. 180 tablet 3  . metoprolol succinate (TOPROL-XL) 100 MG 24 hr tablet TAKE 1 TABLET EVERY MORNING WITH OR IMMEDIATELY FOLLOWING A MEAL 90 tablet 1  . Misc Natural Products (TART CHERRY ADVANCED) CAPS Take 2 capsules by mouth daily.     . Multiple Vitamin (MULTIVITAMIN) tablet Take 1 tablet by mouth daily.      . nitroGLYCERIN (NITROSTAT) 0.4 MG SL tablet Place 1 tablet (0.4 mg total) under the tongue every 5 (five) minutes as needed. For chest pain 25 tablet prn  . Omega-3 Fatty Acids (FISH OIL) 1000 MG CAPS Take 1,000 mg by mouth 2 (two) times daily.     . potassium chloride (K-DUR,KLOR-CON) 10 MEQ tablet Take 1 tablet (10 mEq total) by mouth 2 (two) times daily. 180 tablet 3  . tadalafil (CIALIS) 10 MG tablet Take 40 mg by mouth daily as needed for erectile dysfunction.     . traMADol (ULTRAM) 50 MG tablet Take 50 mg by mouth every 6 (six) hours as needed for pain.     . vitamin B-12 (CYANOCOBALAMIN) 1000 MCG tablet  Take 1,000 mcg by mouth daily.    Marland Kitchen warfarin (COUMADIN) 10 MG tablet TAKE AS DIRECTED BY COUMADIN CLINIC 90 tablet 3  . [DISCONTINUED] potassium chloride (KLOR-CON 10) 10 MEQ CR tablet Take 1 tablet (10 mEq total) by mouth 2 (  two) times daily. 180 tablet 6   No current facility-administered medications on file prior to visit.    BP 124/80 mmHg  Pulse 70  Temp(Src) 98.1 F (36.7 C) (Oral)  Resp 20  Ht 5' 6.25" (1.683 m)  Wt 243 lb (110.224 kg)  BMI 38.91 kg/m2  SpO2 96%     Review of Systems  Constitutional: Negative for fever, chills, appetite change and fatigue.  HENT: Negative for congestion, dental problem, ear pain, hearing loss, sore throat, tinnitus, trouble swallowing and voice change.   Eyes: Negative for pain, discharge and visual disturbance.  Respiratory: Negative for cough, chest tightness, wheezing and stridor.   Cardiovascular: Negative for chest pain, palpitations and leg swelling.  Gastrointestinal: Negative for nausea, vomiting, abdominal pain, diarrhea, constipation, blood in stool and abdominal distention.  Genitourinary: Negative for urgency, hematuria, flank pain, discharge, difficulty urinating and genital sores.  Musculoskeletal: Negative for myalgias, back pain, joint swelling, arthralgias, gait problem and neck stiffness.  Skin: Negative for rash.  Neurological: Negative for dizziness, syncope, speech difficulty, weakness, numbness and headaches.  Hematological: Negative for adenopathy. Does not bruise/bleed easily.  Psychiatric/Behavioral: Negative for behavioral problems and dysphoric mood. The patient is not nervous/anxious.        Objective:   Physical Exam  Constitutional: He is oriented to person, place, and time. He appears well-developed.  HENT:  Head: Normocephalic.  Right Ear: External ear normal.  Left Ear: External ear normal.  Eyes: Conjunctivae and EOM are normal.  Neck: Normal range of motion.  Cardiovascular: Normal rate and normal  heart sounds.   Pulmonary/Chest: Breath sounds normal.  Abdominal: Bowel sounds are normal.  Musculoskeletal: Normal range of motion. He exhibits no edema or tenderness.  Trace edema  Neurological: He is alert and oriented to person, place, and time.  Psychiatric: He has a normal mood and affect. His behavior is normal.          Assessment & Plan:   Diabetes mellitus.  Appears to be well controlled.  We'll check a hemoglobin A1c Hypertension, stable coronary artery disease Status post aVR Status post permanent pacemaker.  Follow cardiology

## 2015-09-10 NOTE — Patient Instructions (Signed)
Limit your sodium (Salt) intake   Please check your hemoglobin A1c every 3-6 months   

## 2015-09-10 NOTE — Progress Notes (Signed)
Pre visit review using our clinic review tool, if applicable. No additional management support is needed unless otherwise documented below in the visit note. 

## 2015-10-01 ENCOUNTER — Ambulatory Visit (INDEPENDENT_AMBULATORY_CARE_PROVIDER_SITE_OTHER): Payer: Commercial Managed Care - HMO | Admitting: Internal Medicine

## 2015-10-01 ENCOUNTER — Encounter: Payer: Self-pay | Admitting: Internal Medicine

## 2015-10-01 ENCOUNTER — Ambulatory Visit (INDEPENDENT_AMBULATORY_CARE_PROVIDER_SITE_OTHER): Payer: Commercial Managed Care - HMO | Admitting: *Deleted

## 2015-10-01 VITALS — BP 124/70 | HR 71 | Ht 68.0 in | Wt 233.8 lb

## 2015-10-01 DIAGNOSIS — Z954 Presence of other heart-valve replacement: Secondary | ICD-10-CM | POA: Diagnosis not present

## 2015-10-01 DIAGNOSIS — Z5181 Encounter for therapeutic drug level monitoring: Secondary | ICD-10-CM

## 2015-10-01 DIAGNOSIS — I442 Atrioventricular block, complete: Secondary | ICD-10-CM

## 2015-10-01 DIAGNOSIS — Z952 Presence of prosthetic heart valve: Secondary | ICD-10-CM

## 2015-10-01 DIAGNOSIS — I359 Nonrheumatic aortic valve disorder, unspecified: Secondary | ICD-10-CM

## 2015-10-01 DIAGNOSIS — Z7901 Long term (current) use of anticoagulants: Secondary | ICD-10-CM | POA: Diagnosis not present

## 2015-10-01 LAB — CUP PACEART INCLINIC DEVICE CHECK
Battery Impedance: 16300 Ohm
Date Time Interrogation Session: 20170322171345
Implantable Lead Implant Date: 20050816
Implantable Lead Location: 753860
Lead Channel Impedance Value: 480 Ohm
Lead Channel Pacing Threshold Pulse Width: 0.5 ms
Lead Channel Setting Sensing Sensitivity: 3 mV
MDC IDC LEAD IMPLANT DT: 20050816
MDC IDC LEAD LOCATION: 753859
MDC IDC MSMT BATTERY REMAINING LONGEVITY: 6
MDC IDC MSMT BATTERY VOLTAGE: 2.66 V
MDC IDC MSMT LEADCHNL RA PACING THRESHOLD AMPLITUDE: 1.25 V
MDC IDC MSMT LEADCHNL RA SENSING INTR AMPL: 2 mV
MDC IDC MSMT LEADCHNL RV IMPEDANCE VALUE: 662 Ohm
MDC IDC MSMT LEADCHNL RV PACING THRESHOLD AMPLITUDE: 0.625 V
MDC IDC MSMT LEADCHNL RV PACING THRESHOLD PULSEWIDTH: 0.5 ms
MDC IDC PG SERIAL: 1380400
MDC IDC SET LEADCHNL RA PACING AMPLITUDE: 2.5 V
MDC IDC SET LEADCHNL RV PACING PULSEWIDTH: 0.5 ms
Pulse Gen Model: 5386

## 2015-10-01 LAB — POCT INR: INR: 1.8

## 2015-10-01 NOTE — Patient Instructions (Signed)

## 2015-10-01 NOTE — Progress Notes (Signed)
HPI Mr. Graul returns today for followup. He is a very pleasant 70 year old man with a history of symptomatic bradycardia due to complete heart block, status post permanent pacemaker insertion. He is s/p Bentall with CHB. He has done well in the interim. He has lost over 20 pounds. His goal is to get down to 200 pounds. He denies chest pain, shortness of breath, or peripheral edema. No cough or hemoptysis. He is considering knee surgery. He is approaching device ERI. Allergies  Allergen Reactions  . Codeine Phosphate Hives, Shortness Of Breath, Itching, Rash and Other (See Comments)    REACTION: difficulty breathing  . Metoprolol Tartrate Other (See Comments)    REACTION: loss of vision. Can take the XL tablet not regular   . Ramipril Cough     Current Outpatient Prescriptions  Medication Sig Dispense Refill  . ACCU-CHEK FASTCLIX LANCETS MISC USE TO CHECK BLOOD SUGAR TWICE A DAY AND PRN 302 each 3  . acetaminophen (TYLENOL) 325 MG tablet Take 650 mg by mouth every 6 (six) hours as needed for mild pain.    Marland Kitchen amoxicillin (AMOXIL) 500 MG capsule Take 4 capsules by mouth as needed. Take one (1) hour prior to dental procedures.    . Ascorbic Acid (VITAMIN C) 500 MG tablet Take 1,000 mg by mouth daily.     Marland Kitchen aspirin 81 MG tablet Take 81 mg by mouth daily.      Marland Kitchen atorvastatin (LIPITOR) 40 MG tablet TAKE 1 TABLET EVERY DAY  (REPLACES  PRAVASTATIN) 90 tablet 3  . calcium carbonate (OS-CAL) 600 MG TABS Take 600 mg by mouth 2 (two) times daily with a meal.    . cetirizine (ZYRTEC) 10 MG tablet Take 20 mg by mouth daily.    . furosemide (LASIX) 40 MG tablet TAKE 1 TABLET TWICE DAILY 180 tablet 1  . glipiZIDE (GLUCOTROL XL) 2.5 MG 24 hr tablet Take 1 tablet (2.5 mg total) by mouth daily with breakfast. 90 tablet 3  . Glucosamine-Chondroitin 250-200 MG CAPS Take 2 capsules by mouth daily.     Marland Kitchen glucose blood (ACCU-CHEK SMARTVIEW) test strip USE TO CHECK BLOOD SUGAR TWICE A DAY AND PRN 300 each 3  .  guaiFENesin-dextromethorphan (ROBITUSSIN DM) 100-10 MG/5ML syrup Take 5 mLs by mouth every 4 (four) hours as needed for cough.    . hydrocortisone 2.5 % lotion Apply topically 2 (two) times daily. 59 mL 0  . losartan (COZAAR) 100 MG tablet Take 1 tablet (100 mg total) by mouth daily. 90 tablet 2  . Meclizine HCl (BONINE) 25 MG CHEW Chew 1 tablet by mouth daily.      . metFORMIN (GLUCOPHAGE) 1000 MG tablet Take 1 tablet (1,000 mg total) by mouth 2 (two) times daily with a meal. 180 tablet 3  . metoprolol succinate (TOPROL-XL) 100 MG 24 hr tablet TAKE 1 TABLET EVERY MORNING WITH OR IMMEDIATELY FOLLOWING A MEAL 90 tablet 1  . Misc Natural Products (TART CHERRY ADVANCED) CAPS Take 2 capsules by mouth daily.     . Multiple Vitamin (MULTIVITAMIN) tablet Take 1 tablet by mouth daily.      . nitroGLYCERIN (NITROSTAT) 0.4 MG SL tablet Place 1 tablet (0.4 mg total) under the tongue every 5 (five) minutes as needed. For chest pain 25 tablet prn  . Omega-3 Fatty Acids (FISH OIL) 1000 MG CAPS Take 1,000 mg by mouth 2 (two) times daily.     . potassium chloride (K-DUR,KLOR-CON) 10 MEQ tablet Take 1 tablet (10 mEq total) by  mouth 2 (two) times daily. 180 tablet 3  . tadalafil (CIALIS) 10 MG tablet Take 40 mg by mouth daily as needed for erectile dysfunction.     . traMADol (ULTRAM) 50 MG tablet Take 50 mg by mouth every 6 (six) hours as needed for pain.     . vitamin B-12 (CYANOCOBALAMIN) 1000 MCG tablet Take 1,000 mcg by mouth daily.    Marland Kitchen warfarin (COUMADIN) 10 MG tablet TAKE AS DIRECTED BY COUMADIN CLINIC 90 tablet 3  . [DISCONTINUED] potassium chloride (KLOR-CON 10) 10 MEQ CR tablet Take 1 tablet (10 mEq total) by mouth 2 (two) times daily. 180 tablet 6   No current facility-administered medications for this visit.     Past Medical History  Diagnosis Date  . Aortic stenosis 08/25/2010    a. Bentall aortic root replacement with a St. Jude mechanical valve and Hemashield conduit 02/2004.  . AV BLOCK,  COMPLETE     a. s/p St Jude dual chamber pacemaker 02/2004.  Marland Kitchen CAD (coronary artery disease)     a. s/p CABGx2 (LIMA-dLAD, SVG-Cx). b. Low risk nuc 12/2012 without ischemia, EF 46% mild apical hypokinesia (EF 55% inf HK by echo).  . DIVERTICULOSIS, COLON 10/17/2007  . GOUT 01/16/2007  . HEMORRHOIDS, INTERNAL 11/01/2008  . HYPERLIPIDEMIA 01/16/2007  . HYPERTENSION 01/16/2007  . LEG EDEMA 11/27/2008  . Impaired glucose tolerance   . Carotid artery disease (HCC)     a. 0-39% bilateral ICA stenosis, stable mild hard plaque in carotid bulbs. F/u 03/2014 recommended.  Marland Kitchen Special screening for malignant neoplasm of prostate   . Warfarin anticoagulation     AVR  . Ejection fraction     a. EF 55% with inf HK, mild MR by echo 12/2012.  . Asthma   . Arthritis   . GERD (gastroesophageal reflux disease)     hxof   . Diabetes mellitus without complication ([place])     ROS:   All systems reviewed and negative except as noted in the HPI.   Past Surgical History  Procedure Laterality Date  . Coronary artery bypass graft    . Aortic valve replacement    . Pacemaker insertion    . Shoulder surgery    . Cardiac catheterization  06/2003  . Circumcision    . Colonoscopy    . Hernia repair    . Coronary stents       prior to bypass   . Inguinal hernia repair Bilateral 02/07/2013    Procedure: LAPAROSCOPIC BILATERAL INGUINAL HERNIA REPAIR;  Surgeon: Imogene Burn. Georgette Dover, MD;  Location: [facility];  Service: General;  Laterality: Bilateral;  . Insertion of mesh Bilateral 02/07/2013    Procedure: INSERTION OF MESH;  Surgeon: Imogene Burn. Georgette Dover, MD;  Location: [facility];  Service: General;  Laterality: Bilateral;     Family History  Problem Relation Age of Onset  . Heart attack Father   . Cancer Father     renal cancer  . Diabetes Brother   . Hypertension Mother   . Diabetes Mother      Social History   Social History  . Marital Status: Married    Spouse Name: N/A  . Number of Children: N/A  . Years of  Education: N/A   Occupational History  . Not on file.   Social History Main Topics  . Smoking status: Former Smoker    Quit date: 07/12/1973  . Smokeless tobacco: Never Used  . Alcohol Use: No  . Drug Use: No  .  Sexual Activity: No   Other Topics Concern  . Not on file   Social History Narrative     BP 124/70 mmHg  Pulse 71  Ht 5\' 8"  (1.727 m)  Wt 233 lb 12.8 oz (106.051 kg)  BMI 35.56 kg/m2  Physical Exam:  Well appearing 70 year old man,NAD HEENT: Unremarkable Neck:  7 cm JVD, no thyromegally Lungs:  Clear with no wheezes, rales, or rhonchi. HEART:  Regular rate rhythm, no murmurs, no rubs, no clicks, mechanical S2 Abd:  soft, positive bowel sounds, no organomegally, no rebound, no guarding Ext:  2 plus pulses, no edema, no cyanosis, no clubbing Skin:  No rashes no nodules Neuro:  CN II through XII intact, motor grossly intact  ECG - NSR with p synchronous ventricular pacing  DEVICE  Normal device function.  See PaceArt for details.   Assess/Plan: 1. Complete heart block - he is doing well, s/p PPM insertion 2. HTN - his blood pressure is well controlled. Will follow. 3. PPM - his St. Jude DDD PM is approaching ERI. Will follow carefully as he has no escape. 4. Obesity - I have encouraged the patient to lose weight. He had lost over 30 lbs in the past but gained it back.   Mikle [email or web address].D.

## 2015-10-03 ENCOUNTER — Encounter: Payer: Self-pay | Admitting: *Deleted

## 2015-10-03 NOTE — Progress Notes (Signed)
Faxed order for monthly TTMs (for battery) to [employer] at [phone].  Fax confirmation received.

## 2015-10-20 ENCOUNTER — Ambulatory Visit (INDEPENDENT_AMBULATORY_CARE_PROVIDER_SITE_OTHER): Payer: Commercial Managed Care - HMO | Admitting: Internal Medicine

## 2015-10-20 ENCOUNTER — Encounter: Payer: Self-pay | Admitting: Internal Medicine

## 2015-10-20 VITALS — BP 120/70 | HR 81 | Temp 98.2°F | Resp 20 | Ht 68.0 in | Wt 231.0 lb

## 2015-10-20 DIAGNOSIS — I10 Essential (primary) hypertension: Secondary | ICD-10-CM | POA: Diagnosis not present

## 2015-10-20 DIAGNOSIS — E119 Type 2 diabetes mellitus without complications: Secondary | ICD-10-CM

## 2015-10-20 DIAGNOSIS — H65191 Other acute nonsuppurative otitis media, right ear: Secondary | ICD-10-CM

## 2015-10-20 MED ORDER — AMOXICILLIN-POT CLAVULANATE 875-125 MG PO TABS: 1.0000 | ORAL_TABLET | Freq: Two times a day (BID) | ORAL | Status: DC

## 2015-10-20 NOTE — Progress Notes (Signed)
Subjective:    Patient ID: [name], male    DOB: Nov 21, 1945, 70 y.o.   MRN: [id number]  HPI  70 year old patient who has a history of essential hypertension.  He is on chronic anticoagulation following aVR.  For the past 6 days he has had chest congestion, cough, low-grade fever for the past couple days she has had worsening right ear pain associated with some bloody drainage.  No documented fever.  She also has some mild pharyngitis  Past Medical History  Diagnosis Date  . Aortic stenosis 08/25/2010    a. Bentall aortic root replacement with a St. Jude mechanical valve and Hemashield conduit 02/2004.  . AV BLOCK, COMPLETE     a. s/p St Jude dual chamber pacemaker 02/2004.  Marland Kitchen CAD (coronary artery disease)     a. s/p CABGx2 (LIMA-dLAD, SVG-Cx). b. Low risk nuc 12/2012 without ischemia, EF 46% mild apical hypokinesia (EF 55% inf HK by echo).  . DIVERTICULOSIS, COLON 10/17/2007  . GOUT 01/16/2007  . HEMORRHOIDS, INTERNAL 11/01/2008  . HYPERLIPIDEMIA 01/16/2007  . HYPERTENSION 01/16/2007  . LEG EDEMA 11/27/2008  . Impaired glucose tolerance   . Carotid artery disease (HCC)     a. 0-39% bilateral ICA stenosis, stable mild hard plaque in carotid bulbs. F/u 03/2014 recommended.  Marland Kitchen Special screening for malignant neoplasm of prostate   . Warfarin anticoagulation     AVR  . Ejection fraction     a. EF 55% with inf HK, mild MR by echo 12/2012.  . Asthma   . Arthritis   . GERD (gastroesophageal reflux disease)     hxof   . Diabetes mellitus without complication [facility])     Social History   Social History  . Marital Status: Married    Spouse Name: N/A  . Number of Children: N/A  . Years of Education: N/A   Occupational History  . Not on file.   Social History Main Topics  . Smoking status: Former Smoker    Quit date: 07/12/1973  . Smokeless tobacco: Never Used  . Alcohol Use: No  . Drug Use: No  . Sexual Activity: No   Other Topics Concern  . Not on file   Social History  Narrative    Past Surgical History  Procedure Laterality Date  . Coronary artery bypass graft    . Aortic valve replacement    . Pacemaker insertion    . Shoulder surgery    . Cardiac catheterization  06/2003  . Circumcision    . Colonoscopy    . Hernia repair    . Coronary stents       prior to bypass   . Inguinal hernia repair Bilateral 02/07/2013    Procedure: LAPAROSCOPIC BILATERAL INGUINAL HERNIA REPAIR;  Surgeon: Imogene Burn. Georgette Dover, MD;  Location: [facility];  Service: General;  Laterality: Bilateral;  . Insertion of mesh Bilateral 02/07/2013    Procedure: INSERTION OF MESH;  Surgeon: Imogene Burn. Georgette Dover, MD;  Location: [facility];  Service: General;  Laterality: Bilateral;    Family History  Problem Relation Age of Onset  . Heart attack Father   . Cancer Father     renal cancer  . Diabetes Brother   . Hypertension Mother   . Diabetes Mother     Allergies  Allergen Reactions  . Codeine Phosphate Hives, Shortness Of Breath, Itching, Rash and Other (See Comments)    REACTION: difficulty breathing  . Metoprolol Tartrate Other (See Comments)    REACTION: loss  of vision. Can take the XL tablet not regular   . Ramipril Cough    Current Outpatient Prescriptions on File Prior to Visit  Medication Sig Dispense Refill  . ACCU-CHEK FASTCLIX LANCETS MISC USE TO CHECK BLOOD SUGAR TWICE A DAY AND PRN 302 each 3  . acetaminophen (TYLENOL) 325 MG tablet Take 650 mg by mouth every 6 (six) hours as needed for mild pain.    Marland Kitchen amoxicillin (AMOXIL) 500 MG capsule Take 4 capsules by mouth as needed. Take one (1) hour prior to dental procedures.    . Ascorbic Acid (VITAMIN C) 500 MG tablet Take 1,000 mg by mouth daily.     Marland Kitchen aspirin 81 MG tablet Take 81 mg by mouth daily.      Marland Kitchen atorvastatin (LIPITOR) 40 MG tablet TAKE 1 TABLET EVERY DAY  (REPLACES  PRAVASTATIN) 90 tablet 3  . calcium carbonate (OS-CAL) 600 MG TABS Take 600 mg by mouth 2 (two) times daily with a meal.    . cetirizine (ZYRTEC) 10 MG  tablet Take 20 mg by mouth daily.    . furosemide (LASIX) 40 MG tablet TAKE 1 TABLET TWICE DAILY 180 tablet 1  . glipiZIDE (GLUCOTROL XL) 2.5 MG 24 hr tablet Take 1 tablet (2.5 mg total) by mouth daily with breakfast. 90 tablet 3  . Glucosamine-Chondroitin 250-200 MG CAPS Take 2 capsules by mouth daily.     Marland Kitchen glucose blood (ACCU-CHEK SMARTVIEW) test strip USE TO CHECK BLOOD SUGAR TWICE A DAY AND PRN 300 each 3  . guaiFENesin-dextromethorphan (ROBITUSSIN DM) 100-10 MG/5ML syrup Take 5 mLs by mouth every 4 (four) hours as needed for cough.    . hydrocortisone 2.5 % lotion Apply topically 2 (two) times daily. 59 mL 0  . losartan (COZAAR) 100 MG tablet Take 1 tablet (100 mg total) by mouth daily. 90 tablet 2  . Meclizine HCl (BONINE) 25 MG CHEW Chew 1 tablet by mouth daily.      . metFORMIN (GLUCOPHAGE) 1000 MG tablet Take 1 tablet (1,000 mg total) by mouth 2 (two) times daily with a meal. 180 tablet 3  . metoprolol succinate (TOPROL-XL) 100 MG 24 hr tablet TAKE 1 TABLET EVERY MORNING WITH OR IMMEDIATELY FOLLOWING A MEAL 90 tablet 1  . Misc Natural Products (TART CHERRY ADVANCED) CAPS Take 2 capsules by mouth daily.     . Multiple Vitamin (MULTIVITAMIN) tablet Take 1 tablet by mouth daily.      . nitroGLYCERIN (NITROSTAT) 0.4 MG SL tablet Place 1 tablet (0.4 mg total) under the tongue every 5 (five) minutes as needed. For chest pain 25 tablet prn  . Omega-3 Fatty Acids (FISH OIL) 1000 MG CAPS Take 1,000 mg by mouth 2 (two) times daily.     . potassium chloride (K-DUR,KLOR-CON) 10 MEQ tablet Take 1 tablet (10 mEq total) by mouth 2 (two) times daily. 180 tablet 3  . tadalafil (CIALIS) 10 MG tablet Take 40 mg by mouth daily as needed for erectile dysfunction.     . traMADol (ULTRAM) 50 MG tablet Take 50 mg by mouth every 6 (six) hours as needed for pain.     . vitamin B-12 (CYANOCOBALAMIN) 1000 MCG tablet Take 1,000 mcg by mouth daily.    Marland Kitchen warfarin (COUMADIN) 10 MG tablet TAKE AS DIRECTED BY COUMADIN  CLINIC 90 tablet 3  . [DISCONTINUED] potassium chloride (KLOR-CON 10) 10 MEQ CR tablet Take 1 tablet (10 mEq total) by mouth 2 (two) times daily. 180 tablet 6   No  current facility-administered medications on file prior to visit.    BP 120/70 mmHg  Pulse 81  Temp(Src) 98.2 F (36.8 C) (Oral)  Resp 20  Ht 5\' 8"  (1.727 m)  Wt 231 lb (104.781 kg)  BMI 35.13 kg/m2  SpO2 98%     Review of Systems  Constitutional: Positive for activity change, appetite change and fatigue. Negative for fever and chills.  HENT: Positive for congestion, ear discharge, ear pain and sore throat. Negative for dental problem, hearing loss, tinnitus, trouble swallowing and voice change.   Eyes: Negative for pain, discharge and visual disturbance.  Respiratory: Positive for cough. Negative for chest tightness, wheezing and stridor.   Cardiovascular: Negative for chest pain, palpitations and leg swelling.  Gastrointestinal: Negative for nausea, vomiting, abdominal pain, diarrhea, constipation, blood in stool and abdominal distention.  Genitourinary: Negative for urgency, hematuria, flank pain, discharge, difficulty urinating and genital sores.  Musculoskeletal: Negative for myalgias, back pain, joint swelling, arthralgias, gait problem and neck stiffness.  Skin: Negative for rash.  Neurological: Negative for dizziness, syncope, speech difficulty, weakness, numbness and headaches.  Hematological: Negative for adenopathy. Does not bruise/bleed easily.  Psychiatric/Behavioral: Negative for behavioral problems and dysphoric mood. The patient is not nervous/anxious.        Objective:   Physical Exam  Constitutional: He is oriented to person, place, and time. He appears well-developed.  HENT:  Head: Normocephalic.  Right Ear: External ear normal.  Left Ear: External ear normal.  Right tympanic membrane was erythematous.  Dried blood present over the tympanic membrane and in the canal Weber lateralized to the  right  Eyes: Conjunctivae and EOM are normal.  Neck: Normal range of motion.  Cardiovascular: Normal rate and normal heart sounds.   Pulmonary/Chest: Breath sounds normal.  Abdominal: Bowel sounds are normal.  Musculoskeletal: Normal range of motion. He exhibits no edema or tenderness.  Neurological: He is alert and oriented to person, place, and time.  Psychiatric: He has a normal mood and affect. His behavior is normal.          Assessment & Plan:   Right otitis media URI Status post aVR Essential hypertension Chronic anticoagulation  Will treat with Augmentin

## 2015-10-20 NOTE — Progress Notes (Signed)
Pre visit review using our clinic review tool, if applicable. No additional management support is needed unless otherwise documented below in the visit note. 

## 2015-10-20 NOTE — Patient Instructions (Addendum)
Otitis Media, Adult Otitis media is redness, soreness, and inflammation of the middle ear. Otitis media may be caused by allergies or, most commonly, by infection. Often it occurs as a complication of the common cold. SIGNS AND SYMPTOMS Symptoms of otitis media may include:  Earache.  Fever.  Ringing in your ear.  Headache.  Leakage of fluid from the ear. DIAGNOSIS To diagnose otitis media, your health care provider will examine your ear with an otoscope. This is an instrument that allows your health care provider to see into your ear in order to examine your eardrum. Your health care provider also will ask you questions about your symptoms. TREATMENT  Typically, otitis media resolves on its own within 3-5 days. Your health care provider may prescribe medicine to ease your symptoms of pain. If otitis media does not resolve within 5 days or is recurrent, your health care provider may prescribe antibiotic medicines if he or she suspects that a bacterial infection is the cause. HOME CARE INSTRUCTIONS   If you were prescribed an antibiotic medicine, finish it all even if you start to feel better.  Take medicines only as directed by your health care provider.  Keep all follow-up visits as directed by your health care provider. SEEK MEDICAL CARE IF:  You have otitis media only in one ear, or bleeding from your nose, or both.  You notice a lump on your neck.  You are not getting better in 3-5 days.  You feel worse instead of better. SEEK IMMEDIATE MEDICAL CARE IF:   You have pain that is not controlled with medicine.  You have swelling, redness, or pain around your ear or stiffness in your neck.  You notice that part of your face is paralyzed.  You notice that the bone behind your ear (mastoid) is tender when you touch it. MAKE SURE YOU:   Understand these instructions.  Will watch your condition.  Will get help right away if you are not doing well or get worse.   This  information is not intended to replace advice given to you by your health care provider. Make sure you discuss any questions you have with your health care provider.   Document Released: 04/02/2004 Document Revised: 07/19/2014 Document Reviewed: 01/23/2013 Elsevier Interactive Patient Education 2016 [place]  Drink plenty of water. Water helps thin the mucus so your sinuses can drain more easily.  Use a humidifier.  Inhale steam 3-4 times a day (for example, sit in the bathroom with the shower running).  Apply a warm, moist washcloth to your face 3-4 times a day, or as directed by your health care provider.  Use saline nasal sprays to help moisten and clean your sinuses.  Take medicines only as directed by your health care provider.  If you were prescribed either an antibiotic or antifungal medicine, finish it all even if you start to feel better.

## 2015-10-21 ENCOUNTER — Telehealth: Payer: Self-pay | Admitting: Internal Medicine

## 2015-10-21 MED ORDER — AMOXICILLIN-POT CLAVULANATE 875-125 MG PO TABS: 1.0000 | ORAL_TABLET | Freq: Two times a day (BID) | ORAL | Status: DC

## 2015-10-21 NOTE — Telephone Encounter (Signed)
Pt.s wife Mel Almond notified Rx was sent to local pharmacy. Jeanetta verbalized understanding.

## 2015-10-21 NOTE — Telephone Encounter (Signed)
Pt would like to have Rx Amoxicillin-clavulanate 875-125 mg sent to [facility] in [place] on [address] it was previously sent to the mail order pharm.

## 2015-10-23 ENCOUNTER — Telehealth: Payer: Self-pay | Admitting: *Deleted

## 2015-10-23 NOTE — Telephone Encounter (Signed)
Spoke with pt's wife and she states he has been placed on Augmentin 875/125mg  1 bid for 1 week and pt's wife instructed that there is no interaction between Augmentin and Coumadin . Instructed to continue to take coumadin as ordered and Augmentin as ordered and to keep his appt in coumadin clinic on April 19th and she states understanding

## 2015-10-29 ENCOUNTER — Ambulatory Visit (INDEPENDENT_AMBULATORY_CARE_PROVIDER_SITE_OTHER): Payer: Commercial Managed Care - HMO | Admitting: *Deleted

## 2015-10-29 DIAGNOSIS — Z5181 Encounter for therapeutic drug level monitoring: Secondary | ICD-10-CM

## 2015-10-29 DIAGNOSIS — Z954 Presence of other heart-valve replacement: Secondary | ICD-10-CM | POA: Diagnosis not present

## 2015-10-29 DIAGNOSIS — I359 Nonrheumatic aortic valve disorder, unspecified: Secondary | ICD-10-CM

## 2015-10-29 DIAGNOSIS — Z7901 Long term (current) use of anticoagulants: Secondary | ICD-10-CM | POA: Diagnosis not present

## 2015-10-29 DIAGNOSIS — Z952 Presence of prosthetic heart valve: Secondary | ICD-10-CM

## 2015-10-29 LAB — POCT INR: INR: 2.7

## 2015-11-17 ENCOUNTER — Other Ambulatory Visit: Payer: Self-pay | Admitting: Internal Medicine

## 2015-11-18 ENCOUNTER — Other Ambulatory Visit: Payer: Self-pay | Admitting: Cardiology

## 2015-11-26 ENCOUNTER — Ambulatory Visit (INDEPENDENT_AMBULATORY_CARE_PROVIDER_SITE_OTHER): Payer: Commercial Managed Care - HMO | Admitting: Pharmacist

## 2015-11-26 DIAGNOSIS — Z954 Presence of other heart-valve replacement: Secondary | ICD-10-CM

## 2015-11-26 DIAGNOSIS — Z5181 Encounter for therapeutic drug level monitoring: Secondary | ICD-10-CM

## 2015-11-26 DIAGNOSIS — Z952 Presence of prosthetic heart valve: Secondary | ICD-10-CM

## 2015-11-26 DIAGNOSIS — Z7901 Long term (current) use of anticoagulants: Secondary | ICD-10-CM

## 2015-11-26 DIAGNOSIS — I359 Nonrheumatic aortic valve disorder, unspecified: Secondary | ICD-10-CM | POA: Diagnosis not present

## 2015-11-26 LAB — POCT INR: INR: 1.8

## 2015-11-28 DIAGNOSIS — M170 Bilateral primary osteoarthritis of knee: Secondary | ICD-10-CM | POA: Diagnosis not present

## 2015-12-03 ENCOUNTER — Other Ambulatory Visit: Payer: Self-pay | Admitting: Internal Medicine

## 2015-12-16 ENCOUNTER — Other Ambulatory Visit: Payer: Self-pay | Admitting: Internal Medicine

## 2015-12-24 ENCOUNTER — Ambulatory Visit (INDEPENDENT_AMBULATORY_CARE_PROVIDER_SITE_OTHER): Payer: Commercial Managed Care - HMO | Admitting: *Deleted

## 2015-12-24 DIAGNOSIS — I359 Nonrheumatic aortic valve disorder, unspecified: Secondary | ICD-10-CM | POA: Diagnosis not present

## 2015-12-24 DIAGNOSIS — Z7901 Long term (current) use of anticoagulants: Secondary | ICD-10-CM | POA: Diagnosis not present

## 2015-12-24 DIAGNOSIS — Z954 Presence of other heart-valve replacement: Secondary | ICD-10-CM | POA: Diagnosis not present

## 2015-12-24 DIAGNOSIS — Z952 Presence of prosthetic heart valve: Secondary | ICD-10-CM

## 2015-12-24 DIAGNOSIS — Z5181 Encounter for therapeutic drug level monitoring: Secondary | ICD-10-CM | POA: Diagnosis not present

## 2015-12-24 LAB — POCT INR: INR: 2

## 2015-12-25 ENCOUNTER — Other Ambulatory Visit: Payer: Self-pay | Admitting: Internal Medicine

## 2015-12-25 NOTE — Telephone Encounter (Signed)
Rx refill sent to pharmacy. 

## 2015-12-26 ENCOUNTER — Encounter: Payer: Self-pay | Admitting: Internal Medicine

## 2015-12-26 DIAGNOSIS — M1711 Unilateral primary osteoarthritis, right knee: Secondary | ICD-10-CM | POA: Diagnosis not present

## 2015-12-26 DIAGNOSIS — M170 Bilateral primary osteoarthritis of knee: Secondary | ICD-10-CM | POA: Diagnosis not present

## 2015-12-26 DIAGNOSIS — I442 Atrioventricular block, complete: Secondary | ICD-10-CM | POA: Diagnosis not present

## 2015-12-26 DIAGNOSIS — M722 Plantar fascial fibromatosis: Secondary | ICD-10-CM | POA: Diagnosis not present

## 2016-01-12 ENCOUNTER — Encounter: Payer: Self-pay | Admitting: Internal Medicine

## 2016-01-12 ENCOUNTER — Ambulatory Visit (INDEPENDENT_AMBULATORY_CARE_PROVIDER_SITE_OTHER): Payer: Commercial Managed Care - HMO | Admitting: Internal Medicine

## 2016-01-12 VITALS — BP 128/82 | HR 72 | Temp 97.7°F | Ht 68.0 in | Wt 234.0 lb

## 2016-01-12 DIAGNOSIS — Z954 Presence of other heart-valve replacement: Secondary | ICD-10-CM | POA: Diagnosis not present

## 2016-01-12 DIAGNOSIS — I10 Essential (primary) hypertension: Secondary | ICD-10-CM | POA: Diagnosis not present

## 2016-01-12 DIAGNOSIS — Z7901 Long term (current) use of anticoagulants: Secondary | ICD-10-CM | POA: Diagnosis not present

## 2016-01-12 DIAGNOSIS — Z952 Presence of prosthetic heart valve: Secondary | ICD-10-CM

## 2016-01-12 DIAGNOSIS — E119 Type 2 diabetes mellitus without complications: Secondary | ICD-10-CM

## 2016-01-12 LAB — HEMOGLOBIN A1C: HEMOGLOBIN A1C: 6.1 % (ref 4.6–6.5)

## 2016-01-12 NOTE — Progress Notes (Signed)
Pre visit review using our clinic review tool, if applicable. No additional management support is needed unless otherwise documented below in the visit note. 

## 2016-01-12 NOTE — Progress Notes (Signed)
Subjective:    Patient ID: [name], male    DOB: March 12, 1946, 70 y.o.   MRN: [id number]  HPI Wt Readings from Last 3 Encounters:  01/12/16 234 lb (106.142 kg)  10/20/15 231 lb (104.781 kg)  10/01/15 233 lb 12.8 oz (106.051 kg)   Lab Results  Component Value Date   HGBA1C 6.1 09/10/2015   70 year old patient who is seen today for follow-up of type 2 diabetes.  For the past 5 days.  Glipizide has been placed on hold due to some episodes of weakness and shaking.  No documented hypoglycemia, but blood sugars are often in the seventies fasting in the morning.  He remains on metformin and thousand milligrams twice daily He is status post aVR and remains on chronic Coumadin anticoagulation. He also complains of lower extremity cramping but also occasionally involves his hands.  He is on furosemide 40 twice a day as well as potassium supplementation.  He has chronic lower extremity edema Last eye examination October 2016 Doing well.  No new concerns or complaints Denies any cardiopulmonary complaints  Past Medical History  Diagnosis Date  . Aortic stenosis 08/25/2010    a. Bentall aortic root replacement with a St. Jude mechanical valve and Hemashield conduit 02/2004.  . AV BLOCK, COMPLETE     a. s/p St Jude dual chamber pacemaker 02/2004.  Marland Kitchen CAD (coronary artery disease)     a. s/p CABGx2 (LIMA-dLAD, SVG-Cx). b. Low risk nuc 12/2012 without ischemia, EF 46% mild apical hypokinesia (EF 55% inf HK by echo).  . DIVERTICULOSIS, COLON 10/17/2007  . GOUT 01/16/2007  . HEMORRHOIDS, INTERNAL 11/01/2008  . HYPERLIPIDEMIA 01/16/2007  . HYPERTENSION 01/16/2007  . LEG EDEMA 11/27/2008  . Impaired glucose tolerance   . Carotid artery disease (HCC)     a. 0-39% bilateral ICA stenosis, stable mild hard plaque in carotid bulbs. F/u 03/2014 recommended.  Marland Kitchen Special screening for malignant neoplasm of prostate   . Warfarin anticoagulation     AVR  . Ejection fraction     a. EF 55% with inf HK, mild MR by  echo 12/2012.  . Asthma   . Arthritis   . GERD (gastroesophageal reflux disease)     hxof   . Diabetes mellitus without complication [facility])      Social History   Social History  . Marital Status: Married    Spouse Name: N/A  . Number of Children: N/A  . Years of Education: N/A   Occupational History  . Not on file.   Social History Main Topics  . Smoking status: Former Smoker    Quit date: 07/12/1973  . Smokeless tobacco: Never Used  . Alcohol Use: No  . Drug Use: No  . Sexual Activity: No   Other Topics Concern  . Not on file   Social History Narrative    Past Surgical History  Procedure Laterality Date  . Coronary artery bypass graft    . Aortic valve replacement    . Pacemaker insertion    . Shoulder surgery    . Cardiac catheterization  06/2003  . Circumcision    . Colonoscopy    . Hernia repair    . Coronary stents       prior to bypass   . Inguinal hernia repair Bilateral 02/07/2013    Procedure: LAPAROSCOPIC BILATERAL INGUINAL HERNIA REPAIR;  Surgeon: Imogene Burn. Georgette Dover, MD;  Location: [facility];  Service: General;  Laterality: Bilateral;  . Insertion of mesh Bilateral 02/07/2013  Procedure: INSERTION OF MESH;  Surgeon: Imogene Burn. Georgette Dover, MD;  Location: [facility];  Service: General;  Laterality: Bilateral;    Family History  Problem Relation Age of Onset  . Heart attack Father   . Cancer Father     renal cancer  . Diabetes Brother   . Hypertension Mother   . Diabetes Mother     Allergies  Allergen Reactions  . Codeine Phosphate Hives, Shortness Of Breath, Itching, Rash and Other (See Comments)    REACTION: difficulty breathing  . Metoprolol Tartrate Other (See Comments)    REACTION: loss of vision. Can take the XL tablet not regular   . Ramipril Cough    Current Outpatient Prescriptions on File Prior to Visit  Medication Sig Dispense Refill  . ACCU-CHEK FASTCLIX LANCETS MISC USE TO CHECK BLOOD SUGAR TWICE A DAY AND AS NEEDED 306 each 3  .  acetaminophen (TYLENOL) 325 MG tablet Take 650 mg by mouth every 6 (six) hours as needed for mild pain.    . Alcohol Swabs (ALCOHOL PREP) PADS     . amoxicillin-clavulanate (AUGMENTIN) 875-125 MG tablet Take 1 tablet by mouth 2 (two) times daily. 14 tablet 0  . Ascorbic Acid (VITAMIN C) 500 MG tablet Take 1,000 mg by mouth daily.     Marland Kitchen aspirin 81 MG tablet Take 81 mg by mouth daily.      Marland Kitchen atorvastatin (LIPITOR) 40 MG tablet Take 1 tablet (40 mg total) by mouth daily at 6 PM. 90 tablet 3  . calcium carbonate (OS-CAL) 600 MG TABS Take 600 mg by mouth 2 (two) times daily with a meal.    . cetirizine (ZYRTEC) 10 MG tablet Take 20 mg by mouth daily.    . furosemide (LASIX) 40 MG tablet TAKE 1 TABLET TWICE DAILY 180 tablet 1  . glipiZIDE (GLUCOTROL XL) 2.5 MG 24 hr tablet Take 1 tablet (2.5 mg total) by mouth daily with breakfast. 90 tablet 3  . Glucosamine-Chondroitin 250-200 MG CAPS Take 2 capsules by mouth daily.     Marland Kitchen glucose blood (ACCU-CHEK SMARTVIEW) test strip USE TO CHECK BLOOD SUGAR TWICE A DAY AND PRN 300 each 3  . guaiFENesin-dextromethorphan (ROBITUSSIN DM) 100-10 MG/5ML syrup Take 5 mLs by mouth every 4 (four) hours as needed for cough.    . hydrocortisone 2.5 % lotion Apply topically 2 (two) times daily. 59 mL 0  . losartan (COZAAR) 100 MG tablet Take 1 tablet (100 mg total) by mouth daily. 90 tablet 2  . Meclizine HCl (BONINE) 25 MG CHEW Chew 1 tablet by mouth daily.      . metFORMIN (GLUCOPHAGE) 1000 MG tablet Take 1 tablet (1,000 mg total) by mouth 2 (two) times daily with a meal. 180 tablet 3  . metoprolol succinate (TOPROL-XL) 100 MG 24 hr tablet TAKE 1 TABLET EVERY MORNING WITH OR IMMEDIATELY FOLLOWING A MEAL 90 tablet 1  . Misc Natural Products (TART CHERRY ADVANCED) CAPS Take 2 capsules by mouth daily.     . Multiple Vitamin (MULTIVITAMIN) tablet Take 1 tablet by mouth daily.      . nitroGLYCERIN (NITROSTAT) 0.4 MG SL tablet Place 1 tablet (0.4 mg total) under the tongue every 5  (five) minutes as needed. For chest pain 25 tablet prn  . Omega-3 Fatty Acids (FISH OIL) 1000 MG CAPS Take 1,000 mg by mouth 2 (two) times daily.     . potassium chloride (K-DUR) 10 MEQ tablet TAKE 1 TABLET TWICE DAILY 180 tablet 3  .  tadalafil (CIALIS) 10 MG tablet Take 40 mg by mouth daily as needed for erectile dysfunction.     . traMADol (ULTRAM) 50 MG tablet Take 50 mg by mouth every 6 (six) hours as needed for pain.     . vitamin B-12 (CYANOCOBALAMIN) 1000 MCG tablet Take 1,000 mcg by mouth daily.    Marland Kitchen warfarin (COUMADIN) 10 MG tablet TAKE AS DIRECTED BY COUMADIN CLINIC 90 tablet 1   No current facility-administered medications on file prior to visit.    BP 128/82 mmHg  Pulse 72  Temp(Src) 97.7 F (36.5 C) (Oral)  Ht 5\' 8"  (1.727 m)  Wt 234 lb (106.142 kg)  BMI 35.59 kg/m2     Review of Systems  Constitutional: Negative for fever, chills, appetite change and fatigue.  HENT: Negative for congestion, dental problem, ear pain, hearing loss, sore throat, tinnitus, trouble swallowing and voice change.   Eyes: Negative for pain, discharge and visual disturbance.  Respiratory: Negative for cough, chest tightness, wheezing and stridor.   Cardiovascular: Positive for leg swelling. Negative for chest pain and palpitations.  Gastrointestinal: Negative for nausea, vomiting, abdominal pain, diarrhea, constipation, blood in stool and abdominal distention.  Genitourinary: Negative for urgency, hematuria, flank pain, discharge, difficulty urinating and genital sores.  Musculoskeletal: Negative for myalgias, back pain, joint swelling, arthralgias, gait problem and neck stiffness.  Skin: Negative for rash.  Neurological: Positive for weakness. Negative for dizziness, syncope, speech difficulty, numbness and headaches.  Hematological: Negative for adenopathy. Does not bruise/bleed easily.  Psychiatric/Behavioral: Negative for behavioral problems and dysphoric mood. The patient is not  nervous/anxious.        Objective:   Physical Exam  Constitutional: He is oriented to person, place, and time. He appears well-developed.  Obese Blood pressure low normal  HENT:  Head: Normocephalic.  Right Ear: External ear normal.  Left Ear: External ear normal.  Eyes: Conjunctivae and EOM are normal.  Neck: Normal range of motion.  Cardiovascular: Normal rate, regular rhythm and normal heart sounds.   Prosthetic heart sounds  Pulmonary/Chest: Breath sounds normal.  Abdominal: Bowel sounds are normal.  Musculoskeletal: Normal range of motion. He exhibits edema. He exhibits no tenderness.  Neurological: He is alert and oriented to person, place, and time.  Psychiatric: He has a normal mood and affect. His behavior is normal.          Assessment & Plan:   Diabetes mellitus.  Appears to be under tight control.  Mild hypoglycemia, fasting in the morning.  Glipizide has been on hold for 5 days and he maintains nice glycemic control.  Will continue to hold.  We'll check hemoglobin A1c today and repeat in 3 months diabetic foot examination performed Essential hypertension, well-controlled Lower extremity edema.  In view of the cramping will attempt to decrease furosemide to 40 mg daily  Reassessed.  3 months  Nyoka Cowden, MD

## 2016-01-12 NOTE — Patient Instructions (Signed)
Limit your sodium (Salt) intake   Please check your hemoglobin A1c every 3 months    It is important that you exercise regularly, at least 20 minutes 3 to 4 times per week.  If you develop chest pain or shortness of breath seek  medical attention.  You need to lose weight.  Consider a lower calorie diet and regular exercise. 

## 2016-01-21 ENCOUNTER — Ambulatory Visit (INDEPENDENT_AMBULATORY_CARE_PROVIDER_SITE_OTHER): Payer: Commercial Managed Care - HMO | Admitting: Pharmacist

## 2016-01-21 DIAGNOSIS — Z5181 Encounter for therapeutic drug level monitoring: Secondary | ICD-10-CM | POA: Diagnosis not present

## 2016-01-21 DIAGNOSIS — I359 Nonrheumatic aortic valve disorder, unspecified: Secondary | ICD-10-CM | POA: Diagnosis not present

## 2016-01-21 DIAGNOSIS — Z7901 Long term (current) use of anticoagulants: Secondary | ICD-10-CM

## 2016-01-21 DIAGNOSIS — Z952 Presence of prosthetic heart valve: Secondary | ICD-10-CM

## 2016-01-21 DIAGNOSIS — Z954 Presence of other heart-valve replacement: Secondary | ICD-10-CM | POA: Diagnosis not present

## 2016-01-21 LAB — POCT INR: INR: 1.8

## 2016-01-26 ENCOUNTER — Encounter: Payer: Self-pay | Admitting: Internal Medicine

## 2016-01-26 ENCOUNTER — Emergency Department (HOSPITAL_COMMUNITY): Payer: Commercial Managed Care - HMO

## 2016-01-26 ENCOUNTER — Encounter (HOSPITAL_COMMUNITY): Payer: Self-pay | Admitting: Emergency Medicine

## 2016-01-26 ENCOUNTER — Telehealth: Payer: Self-pay | Admitting: Internal Medicine

## 2016-01-26 ENCOUNTER — Observation Stay (HOSPITAL_COMMUNITY)
Admission: EM | Admit: 2016-01-26 | Discharge: 2016-01-27 | Disposition: A | Discharge status: Home / Self Care | Payer: Commercial Managed Care - HMO | Attending: Internal Medicine | Admitting: Internal Medicine

## 2016-01-26 ENCOUNTER — Ambulatory Visit (INDEPENDENT_AMBULATORY_CARE_PROVIDER_SITE_OTHER): Payer: Commercial Managed Care - HMO | Admitting: *Deleted

## 2016-01-26 VITALS — BP 108/68 | HR 72 | Resp 16

## 2016-01-26 DIAGNOSIS — R0600 Dyspnea, unspecified: Secondary | ICD-10-CM | POA: Insufficient documentation

## 2016-01-26 DIAGNOSIS — Z7984 Long term (current) use of oral hypoglycemic drugs: Secondary | ICD-10-CM | POA: Insufficient documentation

## 2016-01-26 DIAGNOSIS — IMO0001 Reserved for inherently not codable concepts without codable children: Secondary | ICD-10-CM | POA: Diagnosis present

## 2016-01-26 DIAGNOSIS — I442 Atrioventricular block, complete: Secondary | ICD-10-CM | POA: Diagnosis not present

## 2016-01-26 DIAGNOSIS — E119 Type 2 diabetes mellitus without complications: Secondary | ICD-10-CM | POA: Insufficient documentation

## 2016-01-26 DIAGNOSIS — Z950 Presence of cardiac pacemaker: Secondary | ICD-10-CM

## 2016-01-26 DIAGNOSIS — R05 Cough: Secondary | ICD-10-CM | POA: Diagnosis not present

## 2016-01-26 DIAGNOSIS — Z87891 Personal history of nicotine dependence: Secondary | ICD-10-CM | POA: Diagnosis not present

## 2016-01-26 DIAGNOSIS — Z7982 Long term (current) use of aspirin: Secondary | ICD-10-CM | POA: Diagnosis not present

## 2016-01-26 DIAGNOSIS — R0789 Other chest pain: Principal | ICD-10-CM | POA: Insufficient documentation

## 2016-01-26 DIAGNOSIS — Z951 Presence of aortocoronary bypass graft: Secondary | ICD-10-CM | POA: Diagnosis not present

## 2016-01-26 DIAGNOSIS — R079 Chest pain, unspecified: Secondary | ICD-10-CM | POA: Diagnosis not present

## 2016-01-26 DIAGNOSIS — D72829 Elevated white blood cell count, unspecified: Secondary | ICD-10-CM | POA: Diagnosis present

## 2016-01-26 DIAGNOSIS — J45909 Unspecified asthma, uncomplicated: Secondary | ICD-10-CM | POA: Insufficient documentation

## 2016-01-26 DIAGNOSIS — Z7901 Long term (current) use of anticoagulants: Secondary | ICD-10-CM | POA: Diagnosis not present

## 2016-01-26 DIAGNOSIS — E1139 Type 2 diabetes mellitus with other diabetic ophthalmic complication: Secondary | ICD-10-CM

## 2016-01-26 DIAGNOSIS — Z952 Presence of prosthetic heart valve: Secondary | ICD-10-CM

## 2016-01-26 DIAGNOSIS — Z79899 Other long term (current) drug therapy: Secondary | ICD-10-CM | POA: Diagnosis not present

## 2016-01-26 DIAGNOSIS — I25810 Atherosclerosis of coronary artery bypass graft(s) without angina pectoris: Secondary | ICD-10-CM | POA: Diagnosis present

## 2016-01-26 DIAGNOSIS — R5383 Other fatigue: Secondary | ICD-10-CM | POA: Insufficient documentation

## 2016-01-26 DIAGNOSIS — I2510 Atherosclerotic heart disease of native coronary artery without angina pectoris: Secondary | ICD-10-CM | POA: Insufficient documentation

## 2016-01-26 DIAGNOSIS — Z954 Presence of other heart-valve replacement: Secondary | ICD-10-CM | POA: Diagnosis not present

## 2016-01-26 DIAGNOSIS — I10 Essential (primary) hypertension: Secondary | ICD-10-CM | POA: Diagnosis present

## 2016-01-26 DIAGNOSIS — I25119 Atherosclerotic heart disease of native coronary artery with unspecified angina pectoris: Secondary | ICD-10-CM | POA: Diagnosis present

## 2016-01-26 DIAGNOSIS — R0602 Shortness of breath: Secondary | ICD-10-CM | POA: Diagnosis not present

## 2016-01-26 LAB — URINALYSIS, ROUTINE W REFLEX MICROSCOPIC
BILIRUBIN URINE: NEGATIVE
GLUCOSE, UA: NEGATIVE mg/dL
HGB URINE DIPSTICK: NEGATIVE
KETONES UR: NEGATIVE mg/dL
Leukocytes, UA: NEGATIVE
Nitrite: NEGATIVE
PROTEIN: NEGATIVE mg/dL
Specific Gravity, Urine: 1.016 (ref 1.005–1.030)
pH: 6 (ref 5.0–8.0)

## 2016-01-26 LAB — CUP PACEART INCLINIC DEVICE CHECK
Battery Impedance: 27100 Ohm
Brady Statistic RA Percent Paced: 38 %
Brady Statistic RV Percent Paced: 99 %
Implantable Lead Implant Date: 20050816
Lead Channel Impedance Value: 507 Ohm
Lead Channel Impedance Value: 602 Ohm
Lead Channel Pacing Threshold Pulse Width: 0.5 ms
Lead Channel Setting Sensing Sensitivity: 3 mV
MDC IDC LEAD IMPLANT DT: 20050816
MDC IDC LEAD LOCATION: 753859
MDC IDC LEAD LOCATION: 753860
MDC IDC MSMT BATTERY VOLTAGE: 2.59 V
MDC IDC MSMT LEADCHNL RA PACING THRESHOLD AMPLITUDE: 1.25 V
MDC IDC MSMT LEADCHNL RA SENSING INTR AMPL: 2.1 mV
MDC IDC MSMT LEADCHNL RV PACING THRESHOLD AMPLITUDE: 0.5 V
MDC IDC MSMT LEADCHNL RV PACING THRESHOLD PULSEWIDTH: 0.5 ms
MDC IDC SESS DTM: 20170717143338
MDC IDC SET LEADCHNL RA PACING AMPLITUDE: 2.5 V
MDC IDC SET LEADCHNL RV PACING PULSEWIDTH: 0.5 ms
Pulse Gen Serial Number: 1380400

## 2016-01-26 LAB — I-STAT TROPONIN, ED: TROPONIN I, POC: 0 ng/mL (ref 0.00–0.08)

## 2016-01-26 LAB — BASIC METABOLIC PANEL
ANION GAP: 6 (ref 5–15)
BUN: 14 mg/dL (ref 6–20)
CALCIUM: 9.3 mg/dL (ref 8.9–10.3)
CO2: 28 mmol/L (ref 22–32)
Chloride: 104 mmol/L (ref 101–111)
Creatinine, Ser: 0.96 mg/dL (ref 0.61–1.24)
Glucose, Bld: 129 mg/dL — ABNORMAL HIGH (ref 65–99)
POTASSIUM: 4 mmol/L (ref 3.5–5.1)
SODIUM: 138 mmol/L (ref 135–145)

## 2016-01-26 LAB — PROTIME-INR
INR: 2.17 — ABNORMAL HIGH (ref 0.00–1.49)
Prothrombin Time: 24 seconds — ABNORMAL HIGH (ref 11.6–15.2)

## 2016-01-26 LAB — CBC
HEMATOCRIT: 42.7 % (ref 39.0–52.0)
HEMOGLOBIN: 13.6 g/dL (ref 13.0–17.0)
MCH: 28.6 pg (ref 26.0–34.0)
MCHC: 31.9 g/dL (ref 30.0–36.0)
MCV: 89.7 fL (ref 78.0–100.0)
Platelets: 204 10*3/uL (ref 150–400)
RBC: 4.76 MIL/uL (ref 4.22–5.81)
RDW: 14.9 % (ref 11.5–15.5)
WBC: 11.1 10*3/uL — AB (ref 4.0–10.5)

## 2016-01-26 LAB — TROPONIN I: Troponin I: <0.03 ng/mL (ref <0.03)

## 2016-01-26 LAB — BRAIN NATRIURETIC PEPTIDE: B NATRIURETIC PEPTIDE 5: 102.9 pg/mL — AB (ref 0.0–100.0)

## 2016-01-26 MED ORDER — ACETAMINOPHEN 325 MG PO TABS: 650.0000 mg | ORAL_TABLET | Freq: Four times a day (QID) | ORAL | Status: DC | PRN

## 2016-01-26 MED ORDER — METOPROLOL SUCCINATE ER 100 MG PO TB24
100.0000 mg | ORAL_TABLET | Freq: Every day | ORAL | Status: DC
  Administered 2016-01-27: 100 mg via ORAL
  Filled 2016-01-26: qty 1

## 2016-01-26 MED ORDER — SODIUM CHLORIDE 0.9% FLUSH: 3.0000 mL | INTRAVENOUS | Status: DC | PRN

## 2016-01-26 MED ORDER — ASPIRIN 81 MG PO CHEW
324.0000 mg | CHEWABLE_TABLET | ORAL | Status: AC
  Administered 2016-01-26: 324 mg via ORAL
  Filled 2016-01-26: qty 4

## 2016-01-26 MED ORDER — SODIUM CHLORIDE 0.9% FLUSH
3.0000 mL | Freq: Two times a day (BID) | INTRAVENOUS | Status: DC
  Administered 2016-01-26 – 2016-01-27 (×2): 3 mL via INTRAVENOUS

## 2016-01-26 MED ORDER — NITROGLYCERIN 0.4 MG SL SUBL: 0.4000 mg | SUBLINGUAL_TABLET | SUBLINGUAL | Status: DC | PRN

## 2016-01-26 MED ORDER — ACETAMINOPHEN 325 MG PO TABS: 650.0000 mg | ORAL_TABLET | ORAL | Status: DC | PRN

## 2016-01-26 MED ORDER — GLUCOSAMINE-CHONDROITIN 250-200 MG PO CAPS: 2.0000 | ORAL_CAPSULE | Freq: Every day | ORAL | Status: DC

## 2016-01-26 MED ORDER — WARFARIN SODIUM 5 MG PO TABS
5.0000 mg | ORAL_TABLET | Freq: Once | ORAL | Status: AC
  Administered 2016-01-26: 5 mg via ORAL
  Filled 2016-01-26: qty 1

## 2016-01-26 MED ORDER — ASPIRIN 300 MG RE SUPP: 300.0000 mg | RECTAL | Status: AC

## 2016-01-26 MED ORDER — ADULT MULTIVITAMIN W/MINERALS CH
1.0000 | ORAL_TABLET | Freq: Every day | ORAL | Status: DC
  Administered 2016-01-27: 1 via ORAL
  Filled 2016-01-26: qty 1

## 2016-01-26 MED ORDER — MECLIZINE HCL 25 MG PO TABS
25.0000 mg | ORAL_TABLET | Freq: Every day | ORAL | Status: DC
  Administered 2016-01-27: 25 mg via ORAL
  Filled 2016-01-26: qty 1

## 2016-01-26 MED ORDER — INSULIN ASPART 100 UNIT/ML ~~LOC~~ SOLN: 0.0000 [IU] | Freq: Three times a day (TID) | SUBCUTANEOUS | Status: DC

## 2016-01-26 MED ORDER — ASPIRIN EC 81 MG PO TBEC
81.0000 mg | DELAYED_RELEASE_TABLET | Freq: Every day | ORAL | Status: DC
  Administered 2016-01-27: 81 mg via ORAL
  Filled 2016-01-26: qty 1

## 2016-01-26 MED ORDER — FUROSEMIDE 40 MG PO TABS
40.0000 mg | ORAL_TABLET | Freq: Every day | ORAL | Status: DC
  Administered 2016-01-27: 40 mg via ORAL
  Filled 2016-01-26: qty 1

## 2016-01-26 MED ORDER — SODIUM CHLORIDE 0.9 % IV SOLN: 250.0000 mL | INTRAVENOUS | Status: DC | PRN

## 2016-01-26 MED ORDER — WARFARIN - PHYSICIAN DOSING INPATIENT: Freq: Every day | Status: DC

## 2016-01-26 MED ORDER — LOSARTAN POTASSIUM 50 MG PO TABS: 100.0000 mg | ORAL_TABLET | Freq: Every evening | ORAL | Status: DC

## 2016-01-26 MED ORDER — POTASSIUM CHLORIDE CRYS ER 10 MEQ PO TBCR
10.0000 meq | EXTENDED_RELEASE_TABLET | Freq: Two times a day (BID) | ORAL | Status: DC
  Administered 2016-01-27: 10 meq via ORAL
  Filled 2016-01-26 (×2): qty 1

## 2016-01-26 MED ORDER — ONDANSETRON HCL 4 MG/2ML IJ SOLN: 4.0000 mg | Freq: Four times a day (QID) | INTRAMUSCULAR | Status: DC | PRN

## 2016-01-26 MED ORDER — ATORVASTATIN CALCIUM 40 MG PO TABS
40.0000 mg | ORAL_TABLET | Freq: Every day | ORAL | Status: DC
  Administered 2016-01-26: 40 mg via ORAL
  Filled 2016-01-26: qty 1

## 2016-01-26 NOTE — H&P (Signed)
History and Physical    Cleotis Kepford Whitney [id number] DOB: 11-Jul-1946 DOA: 01/26/2016  PCP: Nyoka Cowden, MD   Patient coming from: Home   Chief Complaint: Acute dyspnea, chest discomfort   HPI: [name] is a 70 y.o. male with medical history significant for CAD status post CABG, complete heart block with pacer, aortic stenosis status post mechanical valve replacement on warfarin, hypertension, and type 2 diabetes mellitus who presents the emergency department for evaluation of acute dyspnea with chest discomfort. Patient reports waking in his usual state of health this morning and was feeling well when he sat down for breakfast. While seated, he experienced acute onset of dyspnea with a vague discomfort in the central and right chest. Patient reports the sensation is similar to when his pacer has been turned off in the cardiology clinic. There were no alleviating or exacerbating factors identified and the patient denied any associated nausea, diaphoresis, or symptoms in the arm, neck, or jaw. Patient was able to get in to see his cardiologist in the clinic today for his evaluation of his pacer, which was found to be operating appropriately, but with battery nearing the end of its life. Patient was advised to seek further evaluation in the emergency department.  ED Course: Upon arrival to the ED, patient is found to be afebrile, saturating well on room air, and with vital signs stable. EKG demonstrates a paced rhythm and chest x-ray is negative for acute cardiopulmonary disease. BMP is unremarkable and CBC is notable only for a leukocytosis to 11,100. Troponin is undetectable and BNP is slightly elevated at 102.9. INR is within the therapeutic range at 2.17 and urinalysis is unremarkable. Patient remained hemodynamically stable in the emergency department and did not experience any further anginal symptoms. Given his significant cardiac history, he will be observed for ongoing  evaluation and management of acute dyspnea and chest discomfort concerning for possible ACS.  Review of Systems:  All other systems reviewed and apart from HPI, are negative.  Past Medical History  Diagnosis Date  . Aortic stenosis 08/25/2010    a. [place] aortic root replacement with a St. Jude mechanical valve and Hemashield conduit 02/2004.  . AV BLOCK, COMPLETE     a. s/p St Jude dual chamber pacemaker 02/2004.  Marland Kitchen CAD (coronary artery disease)     a. s/p CABGx2 (LIMA-dLAD, SVG-Cx). b. Low risk nuc 12/2012 without ischemia, EF 46% mild apical hypokinesia (EF 55% inf HK by echo).  . DIVERTICULOSIS, COLON 10/17/2007  . GOUT 01/16/2007  . HEMORRHOIDS, INTERNAL 11/01/2008  . HYPERLIPIDEMIA 01/16/2007  . HYPERTENSION 01/16/2007  . LEG EDEMA 11/27/2008  . Impaired glucose tolerance   . Carotid artery disease (HCC)     a. 0-39% bilateral ICA stenosis, stable mild hard plaque in carotid bulbs. F/u 03/2014 recommended.  Marland Kitchen Special screening for malignant neoplasm of prostate   . Warfarin anticoagulation     AVR  . Ejection fraction     a. EF 55% with inf HK, mild MR by echo 12/2012.  . Asthma   . Arthritis   . GERD (gastroesophageal reflux disease)     hxof   . Diabetes mellitus without complication [facility])     Past Surgical History  Procedure Laterality Date  . Coronary artery bypass graft    . Aortic valve replacement    . Pacemaker insertion    . Shoulder surgery    . Cardiac catheterization  06/2003  . Circumcision    . Colonoscopy    .  Hernia repair    . Coronary stents       prior to bypass   . Inguinal hernia repair Bilateral 02/07/2013    Procedure: LAPAROSCOPIC BILATERAL INGUINAL HERNIA REPAIR;  Surgeon: Imogene Burn. Georgette Dover, MD;  Location: [facility];  Service: General;  Laterality: Bilateral;  . Insertion of mesh Bilateral 02/07/2013    Procedure: INSERTION OF MESH;  Surgeon: Imogene Burn. Tsuei, MD;  Location: [facility];  Service: General;  Laterality: Bilateral;     reports that he quit  smoking about 42 years ago. He has never used smokeless tobacco. He reports that he does not drink alcohol or use illicit drugs.  Allergies  Allergen Reactions  . Codeine Phosphate Hives, Shortness Of Breath, Itching, Rash and Other (See Comments)    REACTION: difficulty breathing  . Metoprolol Tartrate Other (See Comments)    REACTION: loss of vision. Can take the XL tablet not regular   . Ramipril Cough    Family History  Problem Relation Age of Onset  . Heart attack Father   . Cancer Father     renal cancer  . Diabetes Brother   . Hypertension Mother   . Diabetes Mother      Prior to Admission medications   Medication Sig Start Date End Date Taking? Authorizing Provider  acetaminophen (TYLENOL) 325 MG tablet Take 650 mg by mouth every 6 (six) hours as needed for mild pain.   Yes Historical Provider, MD  Ascorbic Acid (VITAMIN C) 500 MG tablet Take 1,000 mg by mouth daily.    Yes Historical Provider, MD  aspirin 81 MG tablet Take 81 mg by mouth daily.     Yes Historical Provider, MD  atorvastatin (LIPITOR) 40 MG tablet Take 1 tablet (40 mg total) by mouth daily at 6 PM. 12/17/15  Yes Evans Lance, MD  calcium carbonate (OS-CAL) 600 MG TABS Take 600 mg by mouth 2 (two) times daily with a meal.   Yes Historical Provider, MD  cetirizine (ZYRTEC) 10 MG tablet Take 10 mg by mouth daily.    Yes Historical Provider, MD  furosemide (LASIX) 40 MG tablet TAKE 1 TABLET TWICE DAILY Patient taking differently: TAKE 1 TABLET ONCE DAILY 12/25/15  Yes Marletta Lor, MD  Glucosamine-Chondroitin 250-200 MG CAPS Take 2 capsules by mouth daily.    Yes Historical Provider, MD  guaiFENesin-dextromethorphan (ROBITUSSIN DM) 100-10 MG/5ML syrup Take 5 mLs by mouth every 4 (four) hours as needed for cough.   Yes Historical Provider, MD  losartan (COZAAR) 100 MG tablet Take 1 tablet (100 mg total) by mouth daily. Patient taking differently: Take 100 mg by mouth every evening.  07/01/15  Yes Jerline Pain, MD  Meclizine HCl (BONINE) 25 MG CHEW Chew 25 mg by mouth daily.    Yes Historical Provider, MD  metFORMIN (GLUCOPHAGE) 1000 MG tablet Take 1 tablet (1,000 mg total) by mouth 2 (two) times daily with a meal. 04/28/15  Yes Marletta Lor, MD  metoprolol succinate (TOPROL-XL) 100 MG 24 hr tablet TAKE 1 TABLET EVERY MORNING WITH OR IMMEDIATELY FOLLOWING A MEAL 09/09/15  Yes Marletta Lor, MD  Misc Natural Products (TART CHERRY ADVANCED) CAPS Take 2 capsules by mouth daily.    Yes Historical Provider, MD  Multiple Vitamin (MULTIVITAMIN) tablet Take 1 tablet by mouth daily.     Yes Historical Provider, MD  nitroGLYCERIN (NITROSTAT) 0.4 MG SL tablet Place 1 tablet (0.4 mg total) under the tongue every 5 (five) minutes as needed.  For chest pain 04/21/15  Yes Jerline Pain, MD  Omega-3 Fatty Acids (FISH OIL) 1000 MG CAPS Take 1,000 mg by mouth 2 (two) times daily.    Yes Historical Provider, MD  potassium chloride (K-DUR) 10 MEQ tablet TAKE 1 TABLET TWICE DAILY 12/03/15  Yes Marletta Lor, MD  tadalafil (CIALIS) 10 MG tablet Take 40 mg by mouth daily as needed for erectile dysfunction.  04/10/12  Yes Marletta Lor, MD  traMADol (ULTRAM) 50 MG tablet Take 50 mg by mouth every 6 (six) hours as needed for pain.  07/26/12  Yes Historical Provider, MD  vitamin B-12 (CYANOCOBALAMIN) 1000 MCG tablet Take 1,000 mcg by mouth daily.   Yes Historical Provider, MD  warfarin (COUMADIN) 10 MG tablet TAKE AS DIRECTED BY COUMADIN CLINIC Patient taking differently: TAKES 5MG  ON MON ONLY, TAKES 10MG  ALL OTHER DAYS (IN EVENINGS) 11/18/15  Yes Jerline Pain, MD  ACCU-CHEK FASTCLIX LANCETS MISC USE TO CHECK BLOOD SUGAR TWICE A DAY AND AS NEEDED 11/17/15   Marletta Lor, MD  amoxicillin-clavulanate (AUGMENTIN) 875-125 MG tablet Take 1 tablet by mouth 2 (two) times daily. 10/21/15   Marletta Lor, MD  glucose blood (ACCU-CHEK SMARTVIEW) test strip USE TO CHECK BLOOD SUGAR TWICE A DAY AND PRN  04/30/15   Marletta Lor, MD  hydrocortisone 2.5 % lotion Apply topically 2 (two) times daily. 07/22/14   Starlyn Skeans, PA-C    Physical Exam: Filed Vitals:   01/26/16 2030 01/26/16 2100 01/26/16 2130 01/26/16 2200  BP: 151/84 140/79 148/75 142/73  Pulse: 71 69 68 67  Temp:      TempSrc:      Resp: 20 16 22 16   Height:      Weight:      SpO2: 95% 96% 99% 95%      Constitutional: NAD, calm, comfortable Eyes: PERTLA, lids and conjunctivae normal ENMT: Mucous membranes are moist. Posterior pharynx clear of any exudate or lesions.   Neck: normal, supple, no masses, no thyromegaly Respiratory: clear to auscultation bilaterally, no wheezing, no crackles. Normal respiratory effort.   Cardiovascular: S1 & S2 heard, regular rate and rhythm, no significant murmurs. Trace pitting edema to b/l LE's. 2+ pedal pulses.   Abdomen: No distension, no tenderness, no masses palpated. Bowel sounds normal.  Musculoskeletal: no clubbing / cyanosis. No joint deformity upper and lower extremities. Normal muscle tone.  Skin: no significant rashes, lesions, ulcers. Warm, dry, well-perfused. Neurologic: CN 2-12 grossly intact. Sensation intact, DTR normal. Strength 5/5 in all 4 limbs.  Psychiatric: Normal judgment and insight. Alert and oriented x 3. Normal mood and affect.     Labs on Admission: I have personally reviewed following labs and imaging studies  CBC:  Recent Labs Lab 01/26/16 1422  WBC 11.1*  HGB 13.6  HCT 42.7  MCV 89.7  PLT 0000000   Basic Metabolic Panel:  Recent Labs Lab 01/26/16 1422  NA 138  K 4.0  CL 104  CO2 28  GLUCOSE 129*  BUN 14  CREATININE 0.96  CALCIUM 9.3   GFR: Estimated Creatinine Clearance: 82 mL/min (by C-G formula based on Cr of 0.96). Liver Function Tests: No results for input(s): AST, ALT, ALKPHOS, BILITOT, PROT, ALBUMIN in the last 168 hours. No results for input(s): LIPASE, AMYLASE in the last 168 hours. No results for input(s): AMMONIA  in the last 168 hours. Coagulation Profile:  Recent Labs Lab 01/21/16 1002 01/26/16 1422  INR 1.8 2.17*   Cardiac Enzymes: No  results for input(s): CKTOTAL, CKMB, CKMBINDEX, TROPONINI in the last 168 hours. BNP (last 3 results) No results for input(s): PROBNP in the last 8760 hours. HbA1C: No results for input(s): HGBA1C in the last 72 hours. CBG: No results for input(s): GLUCAP in the last 168 hours. Lipid Profile: No results for input(s): CHOL, HDL, LDLCALC, TRIG, CHOLHDL, LDLDIRECT in the last 72 hours. Thyroid Function Tests: No results for input(s): TSH, T4TOTAL, FREET4, T3FREE, THYROIDAB in the last 72 hours. Anemia Panel: No results for input(s): VITAMINB12, FOLATE, FERRITIN, TIBC, IRON, RETICCTPCT in the last 72 hours. Urine analysis:    Component Value Date/Time   COLORURINE YELLOW 01/26/2016 2031   APPEARANCEUR CLEAR 01/26/2016 2031   LABSPEC 1.016 01/26/2016 2031   PHURINE 6.0 01/26/2016 2031   GLUCOSEU NEGATIVE 01/26/2016 2031   HGBUR NEGATIVE 01/26/2016 2031   HGBUR negative 09/17/2009 0800   BILIRUBINUR NEGATIVE 01/26/2016 2031   BILIRUBINUR n 04/23/2015 1002   KETONESUR NEGATIVE 01/26/2016 2031   PROTEINUR NEGATIVE 01/26/2016 2031   PROTEINUR trace 04/23/2015 1002   UROBILINOGEN 0.2 04/23/2015 1002   UROBILINOGEN 0.2 02/14/2013 0910   NITRITE NEGATIVE 01/26/2016 2031   NITRITE n 04/23/2015 1002   LEUKOCYTESUR NEGATIVE 01/26/2016 2031   Sepsis Labs: @LABRCNTIP (procalcitonin:4,lacticidven:4) )No results found for this or any previous visit (from the past 240 hour(s)).   Radiological Exams on Admission: Dg Chest 2 View  01/26/2016  CLINICAL DATA:  Cough starting this morning EXAM: CHEST  2 VIEW COMPARISON:  08/13/2014 FINDINGS: Cardiomediastinal silhouette is stable. Status post median sternotomy and cardiac valve replacement. Dual lead cardiac pacemaker is unchanged in position. No acute infiltrate or pleural effusion. No pulmonary edema. Mild  degenerative changes thoracic spine. IMPRESSION: No active cardiopulmonary disease. Status post CABG and cardiac valve replacement. Electronically Signed   By: Lahoma Crocker M.D.   On: 01/26/2016 14:53    EKG: Independently reviewed. Atrially-paced rhythm.   Assessment/Plan  1. Chest pain  - Pt with hx of CAD as below  - Initial troponin 0.00 and EKG without acute ischemic features  - Sxs have resolved by time of admission  - Monitor on telemetry for ischemic changes; repeat EKG in am, sooner for angina recurrence  - Obtain serial troponin measurements  - ASA 324 mg chew ordered  - Continue beta-blocker and statin  - NTG prn angina   2. CAD - Hx of remote CABG  - Low-risk nuclear med stress test in 2014  - Ruling-out ACS as above, otherwise continue current management with daily ASA 81, Lipitor, Toprol, losartan  3. Hypertension  - At goal currently  - Continue current management with losartan and Toprol   4. Hyperlipidemia  - Continue Lipitor 40 mg qHS   5. Type II DM  - A1c 6.1% earlier this month, reflecting good glycemic-control  - Managed with metformin only at home, will hold while in hospital  - Check CBG with meals and qHS  - Start with low-intensity SSI correctional, adjust prn    6. Aortic valve replacement - Stable, continue AC with warfarin  - INR therapeutic at 2.17 on admission    7. Leukocytosis - WBC elevated to 11.1 on admission with unclear etiology  - No fever or localizing s/s  - Monitor off antibiotics     DVT prophylaxis: warfarin  Code Status: Full  Family Communication: Wife updated at bedside  Disposition Plan: Observe on telemetry  Consults called: None Admission status: Observation     Vianne Bulls, MD [facility]  Pager [phone]  If 7PM-7AM, please contact night-coverage [email or web address] Password [email or web address]  01/26/2016, 10:02 PM

## 2016-01-26 NOTE — Progress Notes (Signed)
Pacemaker check in clinic due to extreme fatigue and shortness of breath. Normal device function. Thresholds, sensing, impedances consistent with previous measurements. RV sensing not tested- patient dependent. Device programmed to maximize longevity. 5 mode switch episodes- longest 10 seconds. Device programmed at appropriate safety margins. Histogram distribution appropriate for patient activity level. Device programmed to optimize intrinsic conduction. Estimated longevity 0.25 years. ROV with device clinic 02/18/16 at 0930.  [name] is c/o SOB, extreme fatigue, some chest pressure since 11:30 this morning. He appears to be tired. He is without pallor and diaphoresis. BP 108/64, HR 70s, 02 95%. He reports that he has not used his inhaler today. He used it while in the office, and reports some relief with his shortness of breath a few minutes later but continues to have some chest pressure. I reviewed these symptoms and findings of PPM check with Dr. Lovena Le. He has advised the patient to seek treatment in the ER if he wishes, given his extensive cardiac history and current symptoms. [name] and his wife are agreeable.

## 2016-01-26 NOTE — ED Provider Notes (Signed)
CSN: [id number]     Arrival date & time 01/26/16  1407 History   First MD Initiated Contact with Patient 01/26/16 1855     Chief Complaint  Patient presents with  . Weakness  . Shortness of Breath     (Consider location/radiation/quality/duration/timing/severity/associated sxs/prior Treatment) HPI  70 year old male who presents with weakness, shortness of breath and chest pressure. He has a history of CAD status post CABG, aortic stenosis status post mechanical aortic valve replacement on Coumadin, complete heart block status post permanent pacemaker, hypertension hyperlipidemia. States that he has been in his usual state of health. Has been more exertional over the weekend working in his yard. Today at 11 to 11:30 AM while eating developed sudden onset of shortness of breath, fatigue and generalized weakness, and chest pressure. Did not feel that symptoms worsened with exertion. Was seen for pacemaker check and at that time his pacemaker has low battery but otherwise functioning normally. He did take a puff of a breathing treatment, he did feel some mild improvement with some mild residual shortness of breath. No major dyspnea on exertion, orthopnea, PND. Has noted some increased left pedal edema.  Past Medical History  Diagnosis Date  . Aortic stenosis 08/25/2010    a. Bentall aortic root replacement with a St. Jude mechanical valve and Hemashield conduit 02/2004.  . AV BLOCK, COMPLETE     a. s/p St Jude dual chamber pacemaker 02/2004.  Marland Kitchen CAD (coronary artery disease)     a. s/p CABGx2 (LIMA-dLAD, SVG-Cx). b. Low risk nuc 12/2012 without ischemia, EF 46% mild apical hypokinesia (EF 55% inf HK by echo).  . DIVERTICULOSIS, COLON 10/17/2007  . GOUT 01/16/2007  . HEMORRHOIDS, INTERNAL 11/01/2008  . HYPERLIPIDEMIA 01/16/2007  . HYPERTENSION 01/16/2007  . LEG EDEMA 11/27/2008  . Impaired glucose tolerance   . Carotid artery disease (HCC)     a. 0-39% bilateral ICA stenosis, stable mild hard plaque in  carotid bulbs. F/u 03/2014 recommended.  Marland Kitchen Special screening for malignant neoplasm of prostate   . Warfarin anticoagulation     AVR  . Ejection fraction     a. EF 55% with inf HK, mild MR by echo 12/2012.  . Asthma   . Arthritis   . GERD (gastroesophageal reflux disease)     hxof   . Diabetes mellitus without complication [facility]    Past Surgical History  Procedure Laterality Date  . Coronary artery bypass graft    . Aortic valve replacement    . Pacemaker insertion    . Shoulder surgery    . Cardiac catheterization  06/2003  . Circumcision    . Colonoscopy    . Hernia repair    . Coronary stents       prior to bypass   . Inguinal hernia repair Bilateral 02/07/2013    Procedure: LAPAROSCOPIC BILATERAL INGUINAL HERNIA REPAIR;  Surgeon: Imogene Burn. Georgette Dover, MD;  Location: [facility];  Service: General;  Laterality: Bilateral;  . Insertion of mesh Bilateral 02/07/2013    Procedure: INSERTION OF MESH;  Surgeon: Imogene Burn. Georgette Dover, MD;  Location: [facility];  Service: General;  Laterality: Bilateral;   Family History  Problem Relation Age of Onset  . Heart attack Father   . Cancer Father     renal cancer  . Diabetes Brother   . Hypertension Mother   . Diabetes Mother    Social History  Substance Use Topics  . Smoking status: Former Smoker    Quit date: 07/12/1973  .  Smokeless tobacco: Never Used  . Alcohol Use: No    Review of Systems 10/14 systems reviewed and are negative other than those stated in the HPI    Allergies  Codeine phosphate; Metoprolol tartrate; and Ramipril  Home Medications   Prior to Admission medications   Medication Sig Start Date End Date Taking? Authorizing Provider  acetaminophen (TYLENOL) 325 MG tablet Take 650 mg by mouth every 6 (six) hours as needed for mild pain.   Yes Historical Provider, MD  Ascorbic Acid (VITAMIN C) 500 MG tablet Take 1,000 mg by mouth daily.    Yes Historical Provider, MD  aspirin 81 MG tablet Take 81 mg by mouth daily.     Yes  Historical Provider, MD  atorvastatin (LIPITOR) 40 MG tablet Take 1 tablet (40 mg total) by mouth daily at 6 PM. 12/17/15  Yes Evans Lance, MD  calcium carbonate (OS-CAL) 600 MG TABS Take 600 mg by mouth 2 (two) times daily with a meal.   Yes Historical Provider, MD  cetirizine (ZYRTEC) 10 MG tablet Take 10 mg by mouth daily.    Yes Historical Provider, MD  furosemide (LASIX) 40 MG tablet TAKE 1 TABLET TWICE DAILY Patient taking differently: TAKE 1 TABLET ONCE DAILY 12/25/15  Yes Marletta Lor, MD  Glucosamine-Chondroitin 250-200 MG CAPS Take 2 capsules by mouth daily.    Yes Historical Provider, MD  guaiFENesin-dextromethorphan (ROBITUSSIN DM) 100-10 MG/5ML syrup Take 5 mLs by mouth every 4 (four) hours as needed for cough.   Yes Historical Provider, MD  losartan (COZAAR) 100 MG tablet Take 1 tablet (100 mg total) by mouth daily. Patient taking differently: Take 100 mg by mouth every evening.  07/01/15  Yes Jerline Pain, MD  Meclizine HCl (BONINE) 25 MG CHEW Chew 25 mg by mouth daily.    Yes Historical Provider, MD  metFORMIN (GLUCOPHAGE) 1000 MG tablet Take 1 tablet (1,000 mg total) by mouth 2 (two) times daily with a meal. 04/28/15  Yes Marletta Lor, MD  metoprolol succinate (TOPROL-XL) 100 MG 24 hr tablet TAKE 1 TABLET EVERY MORNING WITH OR IMMEDIATELY FOLLOWING A MEAL 09/09/15  Yes Marletta Lor, MD  Misc Natural Products (TART CHERRY ADVANCED) CAPS Take 2 capsules by mouth daily.    Yes Historical Provider, MD  Multiple Vitamin (MULTIVITAMIN) tablet Take 1 tablet by mouth daily.     Yes Historical Provider, MD  nitroGLYCERIN (NITROSTAT) 0.4 MG SL tablet Place 1 tablet (0.4 mg total) under the tongue every 5 (five) minutes as needed. For chest pain 04/21/15  Yes Jerline Pain, MD  Omega-3 Fatty Acids (FISH OIL) 1000 MG CAPS Take 1,000 mg by mouth 2 (two) times daily.    Yes Historical Provider, MD  potassium chloride (K-DUR) 10 MEQ tablet TAKE 1 TABLET TWICE DAILY 12/03/15   Yes Marletta Lor, MD  tadalafil (CIALIS) 10 MG tablet Take 40 mg by mouth daily as needed for erectile dysfunction.  04/10/12  Yes Marletta Lor, MD  traMADol (ULTRAM) 50 MG tablet Take 50 mg by mouth every 6 (six) hours as needed for pain.  07/26/12  Yes Historical Provider, MD  vitamin B-12 (CYANOCOBALAMIN) 1000 MCG tablet Take 1,000 mcg by mouth daily.   Yes Historical Provider, MD  warfarin (COUMADIN) 10 MG tablet TAKE AS DIRECTED BY COUMADIN CLINIC Patient taking differently: TAKES 5MG  ON MON ONLY, TAKES 10MG  ALL OTHER DAYS (IN EVENINGS) 11/18/15  Yes Jerline Pain, MD  ACCU-CHEK FASTCLIX LANCETS MISC USE  TO CHECK BLOOD SUGAR TWICE A DAY AND AS NEEDED 11/17/15   Marletta Lor, MD  amoxicillin-clavulanate (AUGMENTIN) 875-125 MG tablet Take 1 tablet by mouth 2 (two) times daily. 10/21/15   Marletta Lor, MD  glucose blood (ACCU-CHEK SMARTVIEW) test strip USE TO CHECK BLOOD SUGAR TWICE A DAY AND PRN 04/30/15   Marletta Lor, MD  hydrocortisone 2.5 % lotion Apply topically 2 (two) times daily. 07/22/14   Courtney Forcucci, PA-C   BP 151/81 mmHg  Pulse 66  Temp(Src) 97.7 F (36.5 C) (Oral)  Resp 18  Ht 5\' 7"  (1.702 m)  Wt 234 lb (106.142 kg)  BMI 36.64 kg/m2  SpO2 97% Physical Exam Physical Exam  Nursing note and vitals reviewed. Constitutional: Well developed, well nourished, non-toxic, and in no acute distress Head: Normocephalic and atraumatic.  Mouth/Throat: Oropharynx is clear and moist.  Neck: Normal range of motion. Neck supple.  Cardiovascular: Normal rate and regular rhythm.  baseline bilateral pedal edema left > right Pulmonary/Chest: Effort normal and breath sounds normal.  Abdominal: Soft. There is no tenderness. There is no rebound and no guarding.  Musculoskeletal: Normal range of motion.  Neurological: Alert, no facial droop, fluent speech, moves all extremities symmetrically Skin: Skin is warm and dry.  Psychiatric: Cooperative  ED Course   Procedures (including critical care time) Labs Review Labs Reviewed  BASIC METABOLIC PANEL - Abnormal; Notable for the following:    Glucose, Bld 129 (*)    All other components within normal limits  CBC - Abnormal; Notable for the following:    WBC 11.1 (*)    All other components within normal limits  BRAIN NATRIURETIC PEPTIDE - Abnormal; Notable for the following:    B Natriuretic Peptide 102.9 (*)    All other components within normal limits  URINALYSIS, ROUTINE W REFLEX MICROSCOPIC (NOT AT [facility])  PROTIME-INR  Randolm Idol, ED    Imaging Review Dg Chest 2 View  01/26/2016  CLINICAL DATA:  Cough starting this morning EXAM: CHEST  2 VIEW COMPARISON:  08/13/2014 FINDINGS: Cardiomediastinal silhouette is stable. Status post median sternotomy and cardiac valve replacement. Dual lead cardiac pacemaker is unchanged in position. No acute infiltrate or pleural effusion. No pulmonary edema. Mild degenerative changes thoracic spine. IMPRESSION: No active cardiopulmonary disease. Status post CABG and cardiac valve replacement. Electronically Signed   By: Lahoma Crocker M.D.   On: 01/26/2016 14:53   I have personally reviewed and evaluated these images and lab results as part of my medical decision-making.   EKG Interpretation   Date/Time:  Monday January 26 2016 14:11:32 EDT Ventricular Rate:  82 PR Interval:    QRS Duration: 186 QT Interval:  450 QTC Calculation: 525 R Axis:   -44 Text Interpretation:  Atrial-sensed ventricular-paced rhythm with  prolonged AV conduction Abnormal ECG Unchanged from prior EKg  Confirmed  by LIU MD, DANA [id number]) on 01/26/2016 7:25:10 PM      MDM   Final diagnoses:  None    Presenting with Chest pressure, fatigue and shortness of breath at 11:30 AM. He is asymptomatic on presentation. EKG is nonischemic and shows paced rhythm. Troponin 1 is negative and chest x-ray without acute cardiopulmonary processes. Does not appear significantly fluid  overloaded and symptoms not suggestive of CHF exacerbation. His higher risk for ACS, with history of CABG, hypertension, hyperlipidemia and age. I discussed with Dr. Myna Hidalgo who will admit for observation for cardiac rule out.    Forde Dandy, MD 01/26/16 2113

## 2016-01-26 NOTE — Telephone Encounter (Signed)
Call transferred into triage for SOB. Pt states for last 20 min he has been feeling like he feels when he gets his device checked and they cut it off briefly.  I asked patient to send a remote transmission. I spoke with Pamala Hurry in device clinic. Forwarding phone note to device triage. Verified patient's home phone number.

## 2016-01-26 NOTE — Telephone Encounter (Signed)
Transmission not received. Called [name]- [name] is still experiencing SOB and fatigue. Considering the battery longevity 0.40yr remaining at 10/01/15 PPM check and his dependency on the device, I have asked if [name] felt comfortable making the drive with [name] to the [facility] in [place]. She said she does and she will be here in about 45 mins. Added [name] to the device schedule for PPM check at 1pm.

## 2016-01-26 NOTE — Telephone Encounter (Signed)
Patient is aware someone will be calling him after they receive his transmission.

## 2016-01-26 NOTE — Telephone Encounter (Signed)
Pt seen in office

## 2016-01-26 NOTE — Telephone Encounter (Signed)
New message  Pt wife called about husband is feeling like he is weak, shortness of breath. Battery in pace maker is 72 years old.   Pt c/o Shortness Of Breath: STAT if SOB developed within the last 24 hours or pt is noticeably SOB on the phone  1. Are you currently SOB (can you hear that pt is SOB on the phone)?  Yes  2. How long have you been experiencing SOB?  A few minutes ago(11:35am)  3. Are you SOB when sitting or when up moving around? Sitting   4. Are you currently experiencing any other symptoms? Weakness

## 2016-01-26 NOTE — ED Notes (Signed)
Pt states while eating breakfast around 11 he started just feeling "not well" and having shortness of breath. Pt denies any chest pain. Pt was seen at cardiologist to have pacemaker checked. Pt states battery life is down to .25 and was sent here for further evaluation. Pt is warm and dry at this time.

## 2016-01-27 ENCOUNTER — Other Ambulatory Visit: Payer: Self-pay | Admitting: Student

## 2016-01-27 DIAGNOSIS — Z950 Presence of cardiac pacemaker: Secondary | ICD-10-CM | POA: Diagnosis not present

## 2016-01-27 DIAGNOSIS — R0600 Dyspnea, unspecified: Secondary | ICD-10-CM

## 2016-01-27 DIAGNOSIS — R0789 Other chest pain: Secondary | ICD-10-CM | POA: Diagnosis not present

## 2016-01-27 DIAGNOSIS — J45909 Unspecified asthma, uncomplicated: Secondary | ICD-10-CM | POA: Diagnosis not present

## 2016-01-27 DIAGNOSIS — I25118 Atherosclerotic heart disease of native coronary artery with other forms of angina pectoris: Secondary | ICD-10-CM | POA: Diagnosis not present

## 2016-01-27 DIAGNOSIS — I442 Atrioventricular block, complete: Secondary | ICD-10-CM | POA: Diagnosis not present

## 2016-01-27 DIAGNOSIS — R079 Chest pain, unspecified: Secondary | ICD-10-CM | POA: Diagnosis not present

## 2016-01-27 DIAGNOSIS — E119 Type 2 diabetes mellitus without complications: Secondary | ICD-10-CM | POA: Diagnosis not present

## 2016-01-27 DIAGNOSIS — Z954 Presence of other heart-valve replacement: Secondary | ICD-10-CM | POA: Diagnosis not present

## 2016-01-27 DIAGNOSIS — R5383 Other fatigue: Secondary | ICD-10-CM | POA: Diagnosis not present

## 2016-01-27 DIAGNOSIS — Z951 Presence of aortocoronary bypass graft: Secondary | ICD-10-CM | POA: Diagnosis not present

## 2016-01-27 DIAGNOSIS — I10 Essential (primary) hypertension: Secondary | ICD-10-CM | POA: Diagnosis not present

## 2016-01-27 DIAGNOSIS — I2510 Atherosclerotic heart disease of native coronary artery without angina pectoris: Secondary | ICD-10-CM | POA: Diagnosis not present

## 2016-01-27 LAB — GLUCOSE, CAPILLARY
Glucose-Capillary: 110 mg/dL — ABNORMAL HIGH (ref 65–99)
Glucose-Capillary: 131 mg/dL — ABNORMAL HIGH (ref 65–99)

## 2016-01-27 LAB — PROTIME-INR
INR: 2.04 — AB (ref 0.00–1.49)
PROTHROMBIN TIME: 22.9 s — AB (ref 11.6–15.2)

## 2016-01-27 LAB — TROPONIN I
Troponin I: <0.03 ng/mL (ref <0.03)
Troponin I: <0.03 ng/mL (ref <0.03)

## 2016-01-27 NOTE — Discharge Instructions (Signed)

## 2016-01-27 NOTE — Consult Note (Signed)
Cardiology Consult    Patient ID: [name] MRN: [id number], DOB/AGE: June 12, 1946   Admit date: 01/26/2016 Date of Consult: 01/27/2016  Primary Physician: Nyoka Cowden, MD Reason for Consult: Chest Pain Primary Cardiologist: Dr. Lovena Le Requesting Provider: Dr. Ree Kida   History of Present Illness    Pleas Newlon Hoganson is a 70 y.o. male with past medical history of CAD (s/p CABG x 2 w/ LIMA-LAD and SVG-Cx in 2005), aortic stenosis (s/p Bentall aortic root replacement w/ St.Jude mechanical valve), CHB (s/p PPM 2005), Type 2 DM, HTN, HLD, and GERD who presented to [facility] ED on 01/26/2016 for evaluation of chest pain.  He called the office earlier that day reported shortness of breath and fatigue. He reported the sensation felt like when his device is cut off to be checked. His device was checked in the clinic and showed normal device functioning and it was programmed to maximize longevity with an estimated longevity of 0.25 years. He has an appointment with the device clinic on 02/18/2016. He also reported at the time of his device check having chest pressure since 1130 that morning. It was therefore recommended he go to the ED for evaluation.   In talking with the patient today, he reports his chest "felt funny" that morning while eating breakfast. He stood up and walked outside which relieved his pain. He says it does not feel like a pressure or shooing pain. Not resembling any previous anginal episodes. He says it "felt relieved" when his device was interrogated. He denies any repeat episodes of chest pain or dyspnea since being admitted. Has ambulated around his room without difficulty. Reports feeling well this morning and wants to go home.  While admitted, cyclic troponin values have been negative. BNP 102.9. INR 2.17. CBC with WBC of 11.1, Hgb 13.6, platelets 204. K+ 4.0. Creatinine 0.96. CXR with no active cardiopulmonary disease. EKG shows AV paced, HR 68, with no acute  ST or T-wave changes when compared to previous tracings.   Past Medical History   Past Medical History  Diagnosis Date  . Aortic stenosis 08/25/2010    a. Bentall aortic root replacement with a St. Jude mechanical valve and Hemashield conduit 02/2004.  . AV BLOCK, COMPLETE     a. s/p St Jude dual chamber pacemaker 02/2004.  Marland Kitchen CAD (coronary artery disease)     a. s/p CABGx2 (LIMA-dLAD, SVG-Cx). b. Low risk nuc 12/2012 without ischemia, EF 46% mild apical hypokinesia (EF 55% inf HK by echo).  . DIVERTICULOSIS, COLON 10/17/2007  . GOUT 01/16/2007  . HEMORRHOIDS, INTERNAL 11/01/2008  . HYPERLIPIDEMIA 01/16/2007  . HYPERTENSION 01/16/2007  . LEG EDEMA 11/27/2008  . Impaired glucose tolerance   . Carotid artery disease (HCC)     a. 0-39% bilateral ICA stenosis, stable mild hard plaque in carotid bulbs. F/u 03/2014 recommended.  Marland Kitchen Special screening for malignant neoplasm of prostate   . Warfarin anticoagulation     AVR  . Ejection fraction     a. EF 55% with inf HK, mild MR by echo 12/2012.  . Asthma   . Arthritis   . GERD (gastroesophageal reflux disease)     hxof   . Diabetes mellitus without complication [facility])     Past Surgical History  Procedure Laterality Date  . Coronary artery bypass graft    . Aortic valve replacement    . Pacemaker insertion    . Shoulder surgery    . Cardiac catheterization  06/2003  . Circumcision    .  Colonoscopy    . Hernia repair    . Coronary stents       prior to bypass   . Inguinal hernia repair Bilateral 02/07/2013    Procedure: LAPAROSCOPIC BILATERAL INGUINAL HERNIA REPAIR;  Surgeon: Imogene Burn. Georgette Dover, MD;  Location: [facility];  Service: General;  Laterality: Bilateral;  . Insertion of mesh Bilateral 02/07/2013    Procedure: INSERTION OF MESH;  Surgeon: Imogene Burn. Georgette Dover, MD;  Location: [facility];  Service: General;  Laterality: Bilateral;     Allergies  Allergies  Allergen Reactions  . Codeine Phosphate Hives, Shortness Of Breath, Itching, Rash and Other  (See Comments)    REACTION: difficulty breathing  . Metoprolol Tartrate Other (See Comments)    REACTION: loss of vision. Can take the XL tablet not regular   . Ramipril Cough    Inpatient Medications    . aspirin EC  81 mg Oral Daily  . atorvastatin  40 mg Oral q1800  . furosemide  40 mg Oral Daily  . insulin aspart  0-9 Units Subcutaneous TID WC  . losartan  100 mg Oral QPM  . meclizine  25 mg Oral Daily  . metoprolol succinate  100 mg Oral Daily  . multivitamin with minerals  1 tablet Oral Daily  . potassium chloride  10 mEq Oral BID  . sodium chloride flush  3 mL Intravenous Q12H  . Warfarin - Physician Dosing Inpatient   Does not apply q66    Family History    Family History  Problem Relation Age of Onset  . Heart attack Father   . Cancer Father     renal cancer  . Diabetes Brother   . Hypertension Mother   . Diabetes Mother     Social History    Social History   Social History  . Marital Status: Married    Spouse Name: N/A  . Number of Children: N/A  . Years of Education: N/A   Occupational History  . Not on file.   Social History Main Topics  . Smoking status: Former Smoker    Quit date: 07/12/1973  . Smokeless tobacco: Never Used  . Alcohol Use: No  . Drug Use: No  . Sexual Activity: No   Other Topics Concern  . Not on file   Social History Narrative     Review of Systems    General:  No chills, fever, night sweats or weight changes.  Cardiovascular:  No orthopnea, palpitations, paroxysmal nocturnal dyspnea. Positive for chest pain, dyspnea, and lower extremity edema. Dermatological: No rash, lesions/masses Respiratory: No cough, Positive for dyspnea. Urologic: No hematuria, dysuria Abdominal:   No nausea, vomiting, diarrhea, bright red blood per rectum, melena, or hematemesis Neurologic:  No visual changes, wkns, changes in mental status. All other systems reviewed and are otherwise negative except as noted above.  Physical Exam      Blood pressure 132/70, pulse 66, temperature 97.7 F (36.5 C), temperature source Oral, resp. rate 18, height 5\' 7"  (1.702 m), weight 229 lb 3.2 oz (103.964 kg), SpO2 93 %.  General: Pleasant,  Caucasian male appearing in NAD. Psych: Normal affect. Neuro: Alert and oriented X 3. Moves all extremities spontaneously. HEENT: Normal  Neck: Supple without bruits or JVD. Lungs:  Resp regular and unlabored, CTA without wheezing or rales. Heart: RRR no s3, s4, or murmurs. Crisp valve sounds appreciated. Abdomen: Soft, non-tender, non-distended, BS + x 4.  Extremities: No clubbing or cyanosis. Trace lower extremity edema. DP/PT/Radials 2+ and  equal bilaterally.  Labs    Troponin [facility] Test)  Recent Labs  01/26/16 1440  TROPIPOC 0.00    Recent Labs  01/26/16 2201 01/27/16 0431  TROPONINI <0.03 <0.03   Lab Results  Component Value Date   WBC 11.1* 01/26/2016   HGB 13.6 01/26/2016   HCT 42.7 01/26/2016   MCV 89.7 01/26/2016   PLT 204 01/26/2016    Recent Labs Lab 01/26/16 1422  NA 138  K 4.0  CL 104  CO2 28  BUN 14  CREATININE 0.96  CALCIUM 9.3  GLUCOSE 129*   Lab Results  Component Value Date   CHOL 151 04/23/2015   HDL 41.50 04/23/2015   LDLCALC 75 04/23/2015   TRIG 173.0* 04/23/2015   Lab Results  Component Value Date   DDIMER 0.42 08/13/2014     Radiology Studies    Dg Chest 2 View  01/26/2016  CLINICAL DATA:  Cough starting this morning EXAM: CHEST  2 VIEW COMPARISON:  08/13/2014 FINDINGS: Cardiomediastinal silhouette is stable. Status post median sternotomy and cardiac valve replacement. Dual lead cardiac pacemaker is unchanged in position. No acute infiltrate or pleural effusion. No pulmonary edema. Mild degenerative changes thoracic spine. IMPRESSION: No active cardiopulmonary disease. Status post CABG and cardiac valve replacement. Electronically Signed   By: Lahoma Crocker M.D.   On: 01/26/2016 14:53    EKG & Cardiac Imaging    EKG:  AV paced,  HR 68, with no acute ST or T-wave changes when compared to previous tracings.  Echocardiogram: 12/2012 Study Conclusions  - Left ventricle: LVEF is approximately 55% with inferior hypokinesis The cavity size was normal. Wall thickness was normal. - Aortic valve: AV prosthesis is difficult to see. Peak and mean gradients through the valve are 22 and 11 mm Hg respectively. Mild aortic insufficiency. - Mitral valve: Mild regurgitation. - Left atrium: The atrium was mildly to moderately dilated. - Pulmonary arteries: PA peak pressure: 43mm Hg (S). Assessment & Plan    1. Atypical Chest Pain - reports developing a "funny sensation" in his chest while eating breakfast on 7/17, saying it feels like when his PPM is cut off to be checked. The sensation improved when he stood up and walked outside. Had a lingering sensation for a few hours but says this had fully resolved by the time he arrived to the ED. No recurrence in chest discomfort since being admitted.  - cyclic troponin values have been negative. EKG shows AV paced, HR 68, with no acute ST or T-wave changes when compared to previous tracings. - overall, his symptoms sound atypical for ischemia. Improved with activity and no dyspnea with exertion noted. - would likely not pursue further ischemic evaluation at this time. Dr. Harrington Challenger to see the patient as well.  2. CAD - s/p CABG x 2 w/ LIMA-LAD and SVG-Cx in 2005. NST in 2014 with no evidence of ischemia.  - continue ASA, statin, BB, and ARB.   3. Aortic stenosis - s/p Bentall aortic root replacement w/ St.Jude mechanical valve in 2005  4. CHB - s/p PPM 2005 - device interrogated in the clinic yesterday and showed normal device function with an estimated longevity of 0.25 years. He has a follow-up appointment with the device clinic on 02/18/2016.    Signed, Erma Heritage, PA-C 01/27/2016, 8:22 AM Pager: [phone]  PT seen and examined  Agree with findingas of B Strader  above Pt with known CAD and also PPM  Had "spell" yesterday  Strange  Not associated with activity  Did take awhile to go away  OK today   On exam Lungs CTA  Cardiac RRR  No S3  Ext without edema    Pacer checked yesterday  Still OK  I would recomm that the pt ambulate   If OK without symptoms I think OK for d/c  WOuld set up for outpt echo to eval LVEF and valve function.  Dorris Carnes

## 2016-01-27 NOTE — ED Notes (Signed)
Attempted report x1. 

## 2016-01-27 NOTE — Progress Notes (Signed)
Pt walked in hallway  Felt OK I feel he is OK to d/c home   Will make sure he has f/u   He should have an outpt echo to reevla valve

## 2016-01-27 NOTE — Care Management Obs Status (Signed)
[facility] NOTIFICATION   Patient Details  Name: KEON AINSWORTH MRN: [id number] Date of Birth: 1945-12-31   Medicare Observation Status Notification Given:  Yes    Bethena Roys, RN 01/27/2016, 11:14 AM

## 2016-01-27 NOTE — Discharge Summary (Signed)
Physician Discharge Summary  [name] [id number] DOB: 09-20-1945 DOA: 01/26/2016  PCP: Nyoka Cowden, MD  Admit date: 01/26/2016 Discharge date: 01/27/2016  Time spent: 45 minutes  Recommendations for Outpatient Follow-up:  Patient will be discharged to home.  Patient will need to follow up with primary care provider within one week of discharge, repeat CBC.  Follow up with cardiology for outpatient echocardiogram. Patient should continue medications as prescribed.  Patient should follow a heart healthy/carb modified diet.   Discharge Diagnoses:  Chest pain  CAD Essential hypertension  Hyperlipidemia  Diabetes mellitus, type II Aortic valve replacement Leukocytosis  Discharge Condition: Stable  Diet recommendation: Heart healthy/carb modified  Filed Weights   01/26/16 1416 01/27/16 0137  Weight: 106.142 kg (234 lb) 103.964 kg (229 lb 3.2 oz)    History of present illness:  On 01/26/2016 by Dr. Christia Reading Opyd [name] is a 70 y.o. male with medical history significant for CAD status post CABG, complete heart block with pacer, aortic stenosis status post mechanical valve replacement on warfarin, hypertension, and type 2 diabetes mellitus who presents the emergency department for evaluation of acute dyspnea with chest discomfort. Patient reports waking in his usual state of health this morning and was feeling well when he sat down for breakfast. While seated, he experienced acute onset of dyspnea with a vague discomfort in the central and right chest. Patient reports the sensation is similar to when his pacer has been turned off in the cardiology clinic. There were no alleviating or exacerbating factors identified and the patient denied any associated nausea, diaphoresis, or symptoms in the arm, neck, or jaw. Patient was able to get in to see his cardiologist in the clinic today for his evaluation of his pacer, which was found to be operating appropriately, but  with battery nearing the end of its life. Patient was advised to seek further evaluation in the emergency department.  Hospital Course:  Chest pain  -Pt with hx of CAD as below  -Initial troponin 0.00 and EKG without acute ischemic features  -Troponins cycled and unremarkable -continue statin and toprol, aspirin  -Cardiology consulted and appreciated, recommended outpatient follow up with echocardiogram  CAD -Hx of remote CABG  -Low-risk nuclear med stress test in 2014  -continue ASA 81, Lipitor, Toprol, losartan  Essential hypertension  -At goal currently  -Continue current management with losartan and Toprol   Hyperlipidemia  -Continue Lipitor 40 mg qHS   Diabetes mellitus, type II -A1c 6.1 (01/12/2016) -Continue home meds at discharge  Aortic valve replacement -Stable, continue AC with warfarin  -INR therapeutic at 2.04   Leukocytosis -WBC elevated to 11.1 on admission with unclear etiology  -CXR and UA unremarkable for infection -Monitor off antibiotics   Procedures: None  Consultations: Cardiology  Discharge Exam: Filed Vitals:   01/27/16 1008 01/27/16 1113  BP: 151/83 145/72  Pulse: 66 67  Temp:    Resp:       General: Well developed, well nourished, NAD, appears stated age  HEENT: NCAT, mucous membranes moist.  Neck: Supple, no JVD, no masses  Cardiovascular: RRR, mechanical S2, no murmurs  Respiratory: Clear to auscultation bilaterally with equal chest rise  Abdomen: Soft, nontender, nondistended, + bowel sounds  Extremities: warm dry without cyanosis clubbing. Trace LE edema  Neuro: AAOx3, nonfocal  Skin: Without rashes exudates or nodules  Psych: Normal affect and demeanor with intact judgement and insight  Discharge Instructions      Discharge Instructions    Discharge instructions  Complete by:  As directed   Patient will be discharged to home.  Patient will need to follow up with primary care provider within one week  of discharge, repeat CBC.  Follow up with cardiology for outpatient echocardiogram. Patient should continue medications as prescribed.  Patient should follow a heart healthy/carb modified diet.            Medication List    STOP taking these medications        amoxicillin-clavulanate 875-125 MG tablet  Commonly known as:  AUGMENTIN     tadalafil 10 MG tablet  Commonly known as:  CIALIS      TAKE these medications        ACCU-CHEK FASTCLIX LANCETS Misc  USE TO CHECK BLOOD SUGAR TWICE A DAY AND AS NEEDED     acetaminophen 325 MG tablet  Commonly known as:  TYLENOL  Take 650 mg by mouth every 6 (six) hours as needed for mild pain.     aspirin 81 MG tablet  Take 81 mg by mouth daily.     atorvastatin 40 MG tablet  Commonly known as:  LIPITOR  Take 1 tablet (40 mg total) by mouth daily at 6 PM.     BONINE 25 MG Chew  Generic drug:  Meclizine HCl  Chew 25 mg by mouth daily.     calcium carbonate 600 MG Tabs tablet  Commonly known as:  OS-CAL  Take 600 mg by mouth 2 (two) times daily with a meal.     cetirizine 10 MG tablet  Commonly known as:  ZYRTEC  Take 10 mg by mouth daily.     Fish Oil 1000 MG Caps  Take 1,000 mg by mouth 2 (two) times daily.     furosemide 40 MG tablet  Commonly known as:  LASIX  TAKE 1 TABLET TWICE DAILY     Glucosamine-Chondroitin 250-200 MG Caps  Take 2 capsules by mouth daily.     glucose blood test strip  Commonly known as:  ACCU-CHEK SMARTVIEW  USE TO CHECK BLOOD SUGAR TWICE A DAY AND PRN     guaiFENesin-dextromethorphan 100-10 MG/5ML syrup  Commonly known as:  ROBITUSSIN DM  Take 5 mLs by mouth every 4 (four) hours as needed for cough.     hydrocortisone 2.5 % lotion  Apply topically 2 (two) times daily.     losartan 100 MG tablet  Commonly known as:  COZAAR  Take 1 tablet (100 mg total) by mouth daily.     metFORMIN 1000 MG tablet  Commonly known as:  GLUCOPHAGE  Take 1 tablet (1,000 mg total) by mouth 2 (two) times  daily with a meal.     metoprolol succinate 100 MG 24 hr tablet  Commonly known as:  TOPROL-XL  TAKE 1 TABLET EVERY MORNING WITH OR IMMEDIATELY FOLLOWING A MEAL     multivitamin tablet  Take 1 tablet by mouth daily.     nitroGLYCERIN 0.4 MG SL tablet  Commonly known as:  NITROSTAT  Place 1 tablet (0.4 mg total) under the tongue every 5 (five) minutes as needed. For chest pain     potassium chloride 10 MEQ tablet  Commonly known as:  K-DUR  TAKE 1 TABLET TWICE DAILY     TART CHERRY ADVANCED Caps  Take 2 capsules by mouth daily.     traMADol 50 MG tablet  Commonly known as:  ULTRAM  Take 50 mg by mouth every 6 (six) hours as needed for pain.  vitamin B-12 1000 MCG tablet  Commonly known as:  CYANOCOBALAMIN  Take 1,000 mcg by mouth daily.     vitamin C 500 MG tablet  Commonly known as:  ASCORBIC ACID  Take 1,000 mg by mouth daily.     warfarin 10 MG tablet  Commonly known as:  COUMADIN  TAKE AS DIRECTED BY COUMADIN CLINIC       Allergies  Allergen Reactions  . Codeine Phosphate Hives, Shortness Of Breath, Itching, Rash and Other (See Comments)    REACTION: difficulty breathing  . Metoprolol Tartrate Other (See Comments)    REACTION: loss of vision. Can take the XL tablet not regular   . Ramipril Cough   Follow-up Information    Follow up with Nyoka Cowden, MD. Schedule an appointment as soon as possible for a visit in 1 week.   Specialty:  Internal Medicine   Why:  Hospital follow up   Contact information:   [place] [address] [phone]       Follow up with Candee Furbish, MD. Schedule an appointment as soon as possible for a visit in 1 week.   Specialty:  Cardiology   Why:  Hospital follow up   Contact information:   [id number] [place] [address] [phone]        The results of significant diagnostics from this hospitalization (including imaging, microbiology, ancillary and laboratory)  are listed below for reference.    Significant Diagnostic Studies: Dg Chest 2 View  01/26/2016  CLINICAL DATA:  Cough starting this morning EXAM: CHEST  2 VIEW COMPARISON:  08/13/2014 FINDINGS: Cardiomediastinal silhouette is stable. Status post median sternotomy and cardiac valve replacement. Dual lead cardiac pacemaker is unchanged in position. No acute infiltrate or pleural effusion. No pulmonary edema. Mild degenerative changes thoracic spine. IMPRESSION: No active cardiopulmonary disease. Status post CABG and cardiac valve replacement. Electronically Signed   By: Lahoma Crocker M.D.   On: 01/26/2016 14:53    Microbiology: No results found for this or any previous visit (from the past 240 hour(s)).   Labs: Basic Metabolic Panel:  Recent Labs Lab 01/26/16 1422  NA 138  K 4.0  CL 104  CO2 28  GLUCOSE 129*  BUN 14  CREATININE 0.96  CALCIUM 9.3   Liver Function Tests: No results for input(s): AST, ALT, ALKPHOS, BILITOT, PROT, ALBUMIN in the last 168 hours. No results for input(s): LIPASE, AMYLASE in the last 168 hours. No results for input(s): AMMONIA in the last 168 hours. CBC:  Recent Labs Lab 01/26/16 1422  WBC 11.1*  HGB 13.6  HCT 42.7  MCV 89.7  PLT 204   Cardiac Enzymes:  Recent Labs Lab 01/26/16 2201 01/27/16 0431 01/27/16 1054  TROPONINI <0.03 <0.03 <0.03   BNP: BNP (last 3 results)  Recent Labs  01/26/16 1422  BNP 102.9*    ProBNP (last 3 results) No results for input(s): PROBNP in the last 8760 hours.  CBG:  Recent Labs Lab 01/27/16 0722 01/27/16 1100  GLUCAP 110* 131*       Signed:  Errik Mitchelle  [facility] 01/27/2016, 1:01 PM

## 2016-02-06 ENCOUNTER — Ambulatory Visit (HOSPITAL_COMMUNITY): Payer: Commercial Managed Care - HMO | Attending: Internal Medicine

## 2016-02-06 DIAGNOSIS — Z8249 Family history of ischemic heart disease and other diseases of the circulatory system: Secondary | ICD-10-CM | POA: Diagnosis not present

## 2016-02-06 DIAGNOSIS — Z952 Presence of prosthetic heart valve: Secondary | ICD-10-CM | POA: Insufficient documentation

## 2016-02-06 DIAGNOSIS — I119 Hypertensive heart disease without heart failure: Secondary | ICD-10-CM | POA: Diagnosis not present

## 2016-02-06 DIAGNOSIS — Z87891 Personal history of nicotine dependence: Secondary | ICD-10-CM | POA: Diagnosis not present

## 2016-02-06 DIAGNOSIS — E119 Type 2 diabetes mellitus without complications: Secondary | ICD-10-CM | POA: Diagnosis not present

## 2016-02-06 DIAGNOSIS — Z951 Presence of aortocoronary bypass graft: Secondary | ICD-10-CM | POA: Insufficient documentation

## 2016-02-06 DIAGNOSIS — I2510 Atherosclerotic heart disease of native coronary artery without angina pectoris: Secondary | ICD-10-CM | POA: Insufficient documentation

## 2016-02-06 DIAGNOSIS — R0600 Dyspnea, unspecified: Secondary | ICD-10-CM

## 2016-02-06 DIAGNOSIS — I351 Nonrheumatic aortic (valve) insufficiency: Secondary | ICD-10-CM | POA: Diagnosis not present

## 2016-02-18 ENCOUNTER — Encounter: Payer: Self-pay | Admitting: Internal Medicine

## 2016-02-18 ENCOUNTER — Ambulatory Visit (INDEPENDENT_AMBULATORY_CARE_PROVIDER_SITE_OTHER): Payer: Commercial Managed Care - HMO | Admitting: *Deleted

## 2016-02-18 ENCOUNTER — Ambulatory Visit (INDEPENDENT_AMBULATORY_CARE_PROVIDER_SITE_OTHER): Payer: Commercial Managed Care - HMO | Admitting: Pharmacist

## 2016-02-18 DIAGNOSIS — Z954 Presence of other heart-valve replacement: Secondary | ICD-10-CM

## 2016-02-18 DIAGNOSIS — Z5181 Encounter for therapeutic drug level monitoring: Secondary | ICD-10-CM | POA: Diagnosis not present

## 2016-02-18 DIAGNOSIS — I442 Atrioventricular block, complete: Secondary | ICD-10-CM

## 2016-02-18 DIAGNOSIS — Z952 Presence of prosthetic heart valve: Secondary | ICD-10-CM

## 2016-02-18 DIAGNOSIS — Z7901 Long term (current) use of anticoagulants: Secondary | ICD-10-CM | POA: Diagnosis not present

## 2016-02-18 DIAGNOSIS — I359 Nonrheumatic aortic valve disorder, unspecified: Secondary | ICD-10-CM | POA: Diagnosis not present

## 2016-02-18 LAB — POCT INR: INR: 1.9

## 2016-02-18 NOTE — Progress Notes (Signed)
Pacemaker check in clinic for battery status only (N/C). Remaining longevity 0-0.25 years. No episodes recorded. No changes made this session. Patient will follow up with the Device clinic on 9/6.

## 2016-02-19 LAB — CUP PACEART INCLINIC DEVICE CHECK
Date Time Interrogation Session: 20170809100848
Implantable Lead Implant Date: 20050816
Implantable Lead Location: 753859
Lead Channel Impedance Value: 494 Ohm
Lead Channel Setting Pacing Pulse Width: 0.5 ms
Lead Channel Setting Sensing Sensitivity: 3 mV
MDC IDC LEAD IMPLANT DT: 20050816
MDC IDC LEAD LOCATION: 753860
MDC IDC MSMT BATTERY IMPEDANCE: 29400 Ohm
MDC IDC MSMT BATTERY VOLTAGE: 2.59 V
MDC IDC MSMT LEADCHNL RV IMPEDANCE VALUE: 578 Ohm
MDC IDC PG SERIAL: 1380400
MDC IDC SET LEADCHNL RA PACING AMPLITUDE: 2.5 V
Pulse Gen Model: 5386

## 2016-03-10 ENCOUNTER — Ambulatory Visit (INDEPENDENT_AMBULATORY_CARE_PROVIDER_SITE_OTHER): Payer: Commercial Managed Care - HMO | Admitting: *Deleted

## 2016-03-10 DIAGNOSIS — Z7901 Long term (current) use of anticoagulants: Secondary | ICD-10-CM | POA: Diagnosis not present

## 2016-03-10 DIAGNOSIS — Z954 Presence of other heart-valve replacement: Secondary | ICD-10-CM

## 2016-03-10 DIAGNOSIS — Z5181 Encounter for therapeutic drug level monitoring: Secondary | ICD-10-CM

## 2016-03-10 DIAGNOSIS — Z952 Presence of prosthetic heart valve: Secondary | ICD-10-CM

## 2016-03-10 DIAGNOSIS — I359 Nonrheumatic aortic valve disorder, unspecified: Secondary | ICD-10-CM

## 2016-03-10 LAB — POCT INR: INR: 1.7

## 2016-03-17 ENCOUNTER — Ambulatory Visit (INDEPENDENT_AMBULATORY_CARE_PROVIDER_SITE_OTHER): Payer: Commercial Managed Care - HMO | Admitting: *Deleted

## 2016-03-17 DIAGNOSIS — I442 Atrioventricular block, complete: Secondary | ICD-10-CM

## 2016-03-17 DIAGNOSIS — Z950 Presence of cardiac pacemaker: Secondary | ICD-10-CM

## 2016-03-17 LAB — CUP PACEART INCLINIC DEVICE CHECK
Battery Remaining Longevity: 0 mo
Battery Voltage: 2.56 V
Brady Statistic RV Percent Paced: 99 %
Implantable Lead Implant Date: 20050816
Implantable Lead Location: 753859
Lead Channel Impedance Value: 473 Ohm
Lead Channel Setting Sensing Sensitivity: 3 mV
MDC IDC LEAD IMPLANT DT: 20050816
MDC IDC LEAD LOCATION: 753860
MDC IDC MSMT BATTERY IMPEDANCE: 31400 Ohm
MDC IDC MSMT LEADCHNL RV IMPEDANCE VALUE: 564 Ohm
MDC IDC PG SERIAL: 1380400
MDC IDC SESS DTM: 20170906135503
MDC IDC SET LEADCHNL RA PACING AMPLITUDE: 2.5 V
MDC IDC SET LEADCHNL RV PACING PULSEWIDTH: 0.5 ms
MDC IDC STAT BRADY RA PERCENT PACED: 39 %

## 2016-03-17 NOTE — Progress Notes (Signed)
Pacemaker check in clinic for battery status only (N/C). Remaining longevity <3 months. Battery voltage 2.56V, ERI at 2.5V. No episode triggers enabled. No mode switches. No changes made this session. ROV with Device Clinic on 04/28/16 for battery check (billable).

## 2016-03-24 ENCOUNTER — Ambulatory Visit (INDEPENDENT_AMBULATORY_CARE_PROVIDER_SITE_OTHER): Payer: Commercial Managed Care - HMO | Admitting: *Deleted

## 2016-03-24 DIAGNOSIS — Z954 Presence of other heart-valve replacement: Secondary | ICD-10-CM

## 2016-03-24 DIAGNOSIS — Z5181 Encounter for therapeutic drug level monitoring: Secondary | ICD-10-CM

## 2016-03-24 DIAGNOSIS — I359 Nonrheumatic aortic valve disorder, unspecified: Secondary | ICD-10-CM

## 2016-03-24 DIAGNOSIS — Z7901 Long term (current) use of anticoagulants: Secondary | ICD-10-CM | POA: Diagnosis not present

## 2016-03-24 DIAGNOSIS — Z952 Presence of prosthetic heart valve: Secondary | ICD-10-CM

## 2016-03-24 LAB — POCT INR: INR: 2.4

## 2016-03-30 DIAGNOSIS — M170 Bilateral primary osteoarthritis of knee: Secondary | ICD-10-CM | POA: Diagnosis not present

## 2016-03-31 ENCOUNTER — Telehealth: Payer: Self-pay | Admitting: *Deleted

## 2016-03-31 NOTE — Telephone Encounter (Signed)
Spoke with patient's wife.  Patient had requested that coumadin clinic appointment and device check appointment be scheduled on the same day.  Patient's wife is agreeable to moving pacer check appointment to 04/14/16 at 9:00am.  She is appreciative of call and denies additional questions or concerns at this time.

## 2016-04-02 ENCOUNTER — Encounter: Payer: Self-pay | Admitting: *Deleted

## 2016-04-06 ENCOUNTER — Encounter: Payer: Self-pay | Admitting: Internal Medicine

## 2016-04-06 ENCOUNTER — Ambulatory Visit (INDEPENDENT_AMBULATORY_CARE_PROVIDER_SITE_OTHER): Payer: Commercial Managed Care - HMO | Admitting: Internal Medicine

## 2016-04-06 VITALS — BP 164/90 | HR 67 | Temp 97.9°F | Resp 20 | Ht 67.0 in | Wt 232.5 lb

## 2016-04-06 DIAGNOSIS — E785 Hyperlipidemia, unspecified: Secondary | ICD-10-CM

## 2016-04-06 DIAGNOSIS — I10 Essential (primary) hypertension: Secondary | ICD-10-CM

## 2016-04-06 DIAGNOSIS — Z23 Encounter for immunization: Secondary | ICD-10-CM | POA: Diagnosis not present

## 2016-04-06 DIAGNOSIS — E119 Type 2 diabetes mellitus without complications: Secondary | ICD-10-CM

## 2016-04-06 DIAGNOSIS — Z0000 Encounter for general adult medical examination without abnormal findings: Secondary | ICD-10-CM

## 2016-04-06 LAB — MICROALBUMIN / CREATININE URINE RATIO
CREATININE, U: 136.1 mg/dL
MICROALB UR: 0.7 mg/dL (ref 0.0–1.9)
Microalb Creat Ratio: 0.5 mg/g (ref 0.0–30.0)

## 2016-04-06 LAB — LIPID PANEL
CHOL/HDL RATIO: 3
Cholesterol: 130 mg/dL (ref 0–200)
HDL: 46 mg/dL (ref >39.00)
LDL CALC: 62 mg/dL (ref 0–99)
NonHDL: 84.26
Triglycerides: 112 mg/dL (ref 0.0–149.0)
VLDL: 22.4 mg/dL (ref 0.0–40.0)

## 2016-04-06 MED ORDER — WARFARIN SODIUM 10 MG PO TABS: ORAL_TABLET | ORAL | 1 refills | Status: DC

## 2016-04-06 NOTE — Progress Notes (Signed)
Subjective:    Patient ID: [name], male    DOB: 08-15-1945, 70 y.o.   MRN: [id number]  HPI  Lab Results  Component Value Date   HGBA1C 6.1 01/12/2016    Wt Readings from Last 3 Encounters:  04/06/16 232 lb 8 oz (105.5 kg)  01/27/16 229 lb 3.2 oz (104 kg)  01/12/16 234 lb (106.1 kg)    BP Readings from Last 3 Encounters:  04/06/16 (!) 164/90  01/27/16 130/60  01/26/16 1/61   70 year old patient who is seen today in follow-up.  He has type 2 diabetes that has been tightly controlled on metformin therapy alone.  He continues close home blood sugar monitoring. He is followed closely by cardiology.  Status post aVR and pacemaker No cardiac complaints  Past Medical History:  Diagnosis Date  . Aortic stenosis 08/25/2010   a. [place] aortic root replacement with a St. Jude mechanical valve and Hemashield conduit 02/2004.  Marland Kitchen Arthritis   . Asthma   . AV BLOCK, COMPLETE    a. s/p St Jude dual chamber pacemaker 02/2004.  Marland Kitchen CAD (coronary artery disease)    a. s/p CABGx2 (LIMA-dLAD, SVG-Cx). b. Low risk nuc 12/2012 without ischemia, EF 46% mild apical hypokinesia (EF 55% inf HK by echo).  . Carotid artery disease ([place])    a. 0-39% bilateral ICA stenosis, stable mild hard plaque in carotid bulbs. F/u 03/2014 recommended.  . Diabetes mellitus without complication ([place])   . DIVERTICULOSIS, COLON 10/17/2007  . Ejection fraction    a. EF 55% with inf HK, mild MR by echo 12/2012.  Marland Kitchen GERD (gastroesophageal reflux disease)    hxof   . GOUT 01/16/2007  . HEMORRHOIDS, INTERNAL 11/01/2008  . HYPERLIPIDEMIA 01/16/2007  . HYPERTENSION 01/16/2007  . Impaired glucose tolerance   . LEG EDEMA 11/27/2008  . Special screening for malignant neoplasm of prostate   . Warfarin anticoagulation    AVR     Social History   Social History  . Marital status: Married    Spouse name: N/A  . Number of children: N/A  . Years of education: N/A   Occupational History  . Not on file.   Social History  Main Topics  . Smoking status: Former Smoker    Quit date: 07/12/1973  . Smokeless tobacco: Never Used  . Alcohol use No  . Drug use: No  . Sexual activity: No   Other Topics Concern  . Not on file   Social History Narrative  . No narrative on file    Past Surgical History:  Procedure Laterality Date  . AORTIC VALVE REPLACEMENT    . CARDIAC CATHETERIZATION  06/2003  . CIRCUMCISION    . COLONOSCOPY    . CORONARY ARTERY BYPASS GRAFT    . coronary stents      prior to bypass   . HERNIA REPAIR    . INGUINAL HERNIA REPAIR Bilateral 02/07/2013   Procedure: LAPAROSCOPIC BILATERAL INGUINAL HERNIA REPAIR;  Surgeon: Imogene Burn. Georgette Dover, MD;  Location: [facility];  Service: General;  Laterality: Bilateral;  . INSERTION OF MESH Bilateral 02/07/2013   Procedure: INSERTION OF MESH;  Surgeon: Imogene Burn. Georgette Dover, MD;  Location: [facility];  Service: General;  Laterality: Bilateral;  . PACEMAKER INSERTION    . SHOULDER SURGERY      Family History  Problem Relation Age of Onset  . Heart attack Father   . Cancer Father     renal cancer  . Hypertension Mother   .  Diabetes Mother   . Diabetes Brother     Allergies  Allergen Reactions  . Codeine Phosphate Hives, Shortness Of Breath, Itching, Rash and Other (See Comments)    REACTION: difficulty breathing  . Metoprolol Tartrate Other (See Comments)    REACTION: loss of vision. Can take the XL tablet not regular   . Ramipril Cough    Current Outpatient Prescriptions on File Prior to Visit  Medication Sig Dispense Refill  . ACCU-CHEK FASTCLIX LANCETS MISC USE TO CHECK BLOOD SUGAR TWICE A DAY AND AS NEEDED 306 each 3  . acetaminophen (TYLENOL) 325 MG tablet Take 650 mg by mouth every 6 (six) hours as needed for mild pain.    . Ascorbic Acid (VITAMIN C) 500 MG tablet Take 1,000 mg by mouth daily.     Marland Kitchen aspirin 81 MG tablet Take 81 mg by mouth daily.      Marland Kitchen atorvastatin (LIPITOR) 40 MG tablet Take 1 tablet (40 mg total) by mouth daily at 6 PM. 90 tablet  3  . calcium carbonate (OS-CAL) 600 MG TABS Take 600 mg by mouth 2 (two) times daily with a meal.    . cetirizine (ZYRTEC) 10 MG tablet Take 10 mg by mouth daily.     . furosemide (LASIX) 40 MG tablet TAKE 1 TABLET TWICE DAILY (Patient taking differently: TAKE 1 TABLET ONCE DAILY) 180 tablet 1  . Glucosamine-Chondroitin 250-200 MG CAPS Take 2 capsules by mouth daily.     Marland Kitchen glucose blood (ACCU-CHEK SMARTVIEW) test strip USE TO CHECK BLOOD SUGAR TWICE A DAY AND PRN 300 each 3  . guaiFENesin-dextromethorphan (ROBITUSSIN DM) 100-10 MG/5ML syrup Take 5 mLs by mouth every 4 (four) hours as needed for cough.    . hydrocortisone 2.5 % lotion Apply topically 2 (two) times daily. 59 mL 0  . losartan (COZAAR) 100 MG tablet Take 1 tablet (100 mg total) by mouth daily. (Patient taking differently: Take 100 mg by mouth every evening. ) 90 tablet 2  . Meclizine HCl (BONINE) 25 MG CHEW Chew 25 mg by mouth daily.     . metFORMIN (GLUCOPHAGE) 1000 MG tablet Take 1 tablet (1,000 mg total) by mouth 2 (two) times daily with a meal. 180 tablet 3  . metoprolol succinate (TOPROL-XL) 100 MG 24 hr tablet TAKE 1 TABLET EVERY MORNING WITH OR IMMEDIATELY FOLLOWING A MEAL 90 tablet 1  . Misc Natural Products (TART CHERRY ADVANCED) CAPS Take 2 capsules by mouth daily.     . Multiple Vitamin (MULTIVITAMIN) tablet Take 1 tablet by mouth daily.      . nitroGLYCERIN (NITROSTAT) 0.4 MG SL tablet Place 1 tablet (0.4 mg total) under the tongue every 5 (five) minutes as needed. For chest pain 25 tablet prn  . Omega-3 Fatty Acids (FISH OIL) 1000 MG CAPS Take 1,000 mg by mouth 2 (two) times daily.     . potassium chloride (K-DUR) 10 MEQ tablet TAKE 1 TABLET TWICE DAILY 180 tablet 3  . traMADol (ULTRAM) 50 MG tablet Take 50 mg by mouth every 6 (six) hours as needed for pain.     . vitamin B-12 (CYANOCOBALAMIN) 1000 MCG tablet Take 1,000 mcg by mouth daily.     No current facility-administered medications on file prior to visit.     BP  (!) 164/90 (BP Location: Left Arm, Patient Position: Sitting, Cuff Size: Normal)   Pulse 67   Temp 97.9 F (36.6 C) (Oral)   Resp 20   Ht 5\' 7"  (1.702 m)  Wt 232 lb 8 oz (105.5 kg)   SpO2 97%   BMI 36.41 kg/m     Review of Systems  Constitutional: Negative for appetite change, chills, fatigue and fever.  HENT: Negative for congestion, dental problem, ear pain, hearing loss, sore throat, tinnitus, trouble swallowing and voice change.   Eyes: Negative for pain, discharge and visual disturbance.  Respiratory: Negative for cough, chest tightness, wheezing and stridor.   Cardiovascular: Negative for chest pain, palpitations and leg swelling.  Gastrointestinal: Negative for abdominal distention, abdominal pain, blood in stool, constipation, diarrhea, nausea and vomiting.  Genitourinary: Negative for difficulty urinating, discharge, flank pain, genital sores, hematuria and urgency.  Musculoskeletal: Negative for arthralgias, back pain, gait problem, joint swelling, myalgias and neck stiffness.  Skin: Negative for rash.  Neurological: Negative for dizziness, syncope, speech difficulty, weakness, numbness and headaches.  Hematological: Negative for adenopathy. Does not bruise/bleed easily.  Psychiatric/Behavioral: Negative for behavioral problems and dysphoric mood. The patient is not nervous/anxious.        Objective:   Physical Exam  Constitutional: He is oriented to person, place, and time. He appears well-developed.  HENT:  Head: Normocephalic.  Right Ear: External ear normal.  Left Ear: External ear normal.  Eyes: Conjunctivae and EOM are normal.  Neck: Normal range of motion.  Cardiovascular: Normal rate.   Prosthetic heart sounds  Pulmonary/Chest: Breath sounds normal.  Abdominal: Bowel sounds are normal.  Musculoskeletal: Normal range of motion. He exhibits no edema or tenderness.  Neurological: He is alert and oriented to person, place, and time.  Psychiatric: He has a  normal mood and affect. His behavior is normal.          Assessment & Plan:   Diabetes mellitus.  Tight control.  Check hemoglobin A1c in 6 months Dyslipidemia.  Check lipid profile Status post aVR.  Follow-up cardiology Status post pacemaker.  Follow cardiology   CPX 6 months  [id number],PETER Pilar Plate

## 2016-04-06 NOTE — Patient Instructions (Signed)
Limit your sodium (Salt) intake   Please check your hemoglobin A1c every 3-6 months    It is important that you exercise regularly, at least 20 minutes 3 to 4 times per week.  If you develop chest pain or shortness of breath seek  medical attention.  You need to lose weight.  Consider a lower calorie diet and regular exercise.    

## 2016-04-07 LAB — HEPATITIS C ANTIBODY: HCV Ab: NEGATIVE

## 2016-04-14 ENCOUNTER — Ambulatory Visit (INDEPENDENT_AMBULATORY_CARE_PROVIDER_SITE_OTHER): Payer: Commercial Managed Care - HMO | Admitting: Pharmacist

## 2016-04-14 ENCOUNTER — Ambulatory Visit (INDEPENDENT_AMBULATORY_CARE_PROVIDER_SITE_OTHER): Payer: Commercial Managed Care - HMO | Admitting: *Deleted

## 2016-04-14 ENCOUNTER — Encounter: Payer: Self-pay | Admitting: Internal Medicine

## 2016-04-14 DIAGNOSIS — Z950 Presence of cardiac pacemaker: Secondary | ICD-10-CM

## 2016-04-14 DIAGNOSIS — Z952 Presence of prosthetic heart valve: Secondary | ICD-10-CM

## 2016-04-14 DIAGNOSIS — I359 Nonrheumatic aortic valve disorder, unspecified: Secondary | ICD-10-CM | POA: Diagnosis not present

## 2016-04-14 DIAGNOSIS — Z5181 Encounter for therapeutic drug level monitoring: Secondary | ICD-10-CM | POA: Diagnosis not present

## 2016-04-14 DIAGNOSIS — Z7901 Long term (current) use of anticoagulants: Secondary | ICD-10-CM

## 2016-04-14 LAB — POCT INR: INR: 2

## 2016-04-14 NOTE — Progress Notes (Signed)
Pacemaker battery check in clinic. Device at [employer]. Normal device function. Thresholds, sensing, impedances consistent with previous measurements. Device programmed to maximize longevity. No mode switch or high ventricular rates noted. Device programmed at appropriate safety margins. Histogram distribution appropriate for patient activity level. Device programmed to optimize intrinsic conduction. Batery voltage 2.56--ERI. ROV with GT 10/9.

## 2016-04-18 ENCOUNTER — Other Ambulatory Visit: Payer: Self-pay | Admitting: Internal Medicine

## 2016-04-19 ENCOUNTER — Encounter: Payer: Self-pay | Admitting: Internal Medicine

## 2016-04-19 ENCOUNTER — Ambulatory Visit (INDEPENDENT_AMBULATORY_CARE_PROVIDER_SITE_OTHER): Payer: Commercial Managed Care - HMO | Admitting: Internal Medicine

## 2016-04-19 ENCOUNTER — Other Ambulatory Visit: Payer: Self-pay | Admitting: Cardiology

## 2016-04-19 ENCOUNTER — Telehealth: Payer: Self-pay

## 2016-04-19 VITALS — BP 124/72 | HR 72 | Ht 67.75 in | Wt 234.2 lb

## 2016-04-19 DIAGNOSIS — Z950 Presence of cardiac pacemaker: Secondary | ICD-10-CM

## 2016-04-19 NOTE — Progress Notes (Signed)
HPI [name] returns today for followup. He is a very pleasant 70 year old man with a history of symptomatic bradycardia due to complete heart block, status post permanent pacemaker insertion. He is s/p Bentall with CHB. He has done well in the interim. He has lost over 20 pounds. His goal is to get down to 200 pounds. He denies chest pain, shortness of breath, or peripheral edema. No cough or hemoptysis. He is considering knee surgery. He is approaching device ERI. Allergies  Allergen Reactions  . Codeine Phosphate Hives, Shortness Of Breath, Itching, Rash and Other (See Comments)    REACTION: difficulty breathing  . Metoprolol Tartrate Other (See Comments)    REACTION: loss of vision. Can take the XL tablet not regular   . Other     Bumble bees - has to lay down  . Poison Ivy Extract     Severe rash and hives all over body  . Poison Oak Extract [Poison Oak Extract]     Severe rash and hives all over body  . Ramipril Cough     Current Outpatient Prescriptions  Medication Sig Dispense Refill  . ACCU-CHEK FASTCLIX LANCETS MISC USE TO CHECK BLOOD SUGAR TWICE A DAY AND AS NEEDED 306 each 3  . acetaminophen (TYLENOL) 325 MG tablet Take 650 mg by mouth every 6 (six) hours as needed for mild pain.    . Ascorbic Acid (VITAMIN C) 500 MG tablet Take 1,000 mg by mouth daily.     Marland Kitchen aspirin 81 MG tablet Take 81 mg by mouth daily.      Marland Kitchen atorvastatin (LIPITOR) 40 MG tablet Take 1 tablet (40 mg total) by mouth daily at 6 PM. 90 tablet 3  . calcium carbonate (OS-CAL) 600 MG TABS Take 600 mg by mouth 2 (two) times daily with a meal.    . cetirizine (ZYRTEC) 10 MG tablet Take 10 mg by mouth daily.     . furosemide (LASIX) 40 MG tablet Take 40 mg by mouth daily.    . Glucosamine-Chondroitin 250-200 MG CAPS Take 2 capsules by mouth daily.     Marland Kitchen glucose blood (ACCU-CHEK SMARTVIEW) test strip USE TO CHECK BLOOD SUGAR TWICE A DAY AND PRN 300 each 3  . guaiFENesin-dextromethorphan (ROBITUSSIN DM) 100-10  MG/5ML syrup Take 5 mLs by mouth every 4 (four) hours as needed for cough.    . hydrocortisone 2.5 % lotion Apply 1 application topically 2 (two) times daily.    Marland Kitchen losartan (COZAAR) 100 MG tablet Take 1 tablet (100 mg total) by mouth daily. 90 tablet 2  . Meclizine HCl (BONINE) 25 MG CHEW Chew 25 mg by mouth daily.     . metFORMIN (GLUCOPHAGE) 1000 MG tablet TAKE 1 TABLET (1,000 MG TOTAL) BY MOUTH 2 (TWO) TIMES DAILY WITH A MEAL. 180 tablet 3  . metoprolol succinate (TOPROL-XL) 100 MG 24 hr tablet Take 100 mg by mouth every morning. Take with or immediately following a meal.    . Misc Natural Products (TART CHERRY ADVANCED) CAPS Take 2 capsules by mouth daily.     . Multiple Vitamin (MULTIVITAMIN) tablet Take 1 tablet by mouth daily.      . nitroGLYCERIN (NITROSTAT) 0.4 MG SL tablet Place 1 tablet (0.4 mg total) under the tongue every 5 (five) minutes as needed. For chest pain 25 tablet prn  . Omega-3 Fatty Acids (FISH OIL) 1000 MG CAPS Take 1,000 mg by mouth 2 (two) times daily.     . potassium chloride (K-DUR) 10 MEQ  tablet Take 10 mEq by mouth daily.     . traMADol (ULTRAM) 50 MG tablet Take 50 mg by mouth every 6 (six) hours as needed for pain.     . vitamin B-12 (CYANOCOBALAMIN) 1000 MCG tablet Take 1,000 mcg by mouth daily.    Marland Kitchen warfarin (COUMADIN) 10 MG tablet TAKE AS DIRECTED BY COUMADIN CLINIC 90 tablet 1   No current facility-administered medications for this visit.      Past Medical History:  Diagnosis Date  . Aortic stenosis 08/25/2010   a. Bentall aortic root replacement with a St. Jude mechanical valve and Hemashield conduit 02/2004.  Marland Kitchen Arthritis   . Asthma   . AV BLOCK, COMPLETE    a. s/p St Jude dual chamber pacemaker 02/2004.  Marland Kitchen CAD (coronary artery disease)    a. s/p CABGx2 (LIMA-dLAD, SVG-Cx). b. Low risk nuc 12/2012 without ischemia, EF 46% mild apical hypokinesia (EF 55% inf HK by echo).  . Carotid artery disease ([place])    a. 0-39% bilateral ICA stenosis, stable mild  hard plaque in carotid bulbs. F/u 03/2014 recommended.  . Diabetes mellitus without complication ([place])   . DIVERTICULOSIS, COLON 10/17/2007  . Ejection fraction    a. EF 55% with inf HK, mild MR by echo 12/2012.  Marland Kitchen GERD (gastroesophageal reflux disease)    hxof   . GOUT 01/16/2007  . HEMORRHOIDS, INTERNAL 11/01/2008  . HYPERLIPIDEMIA 01/16/2007  . HYPERTENSION 01/16/2007  . Impaired glucose tolerance   . LEG EDEMA 11/27/2008  . Special screening for malignant neoplasm of prostate   . Warfarin anticoagulation    AVR    ROS:   All systems reviewed and negative except as noted in the HPI.   Past Surgical History:  Procedure Laterality Date  . AORTIC VALVE REPLACEMENT    . CARDIAC CATHETERIZATION  06/2003  . CIRCUMCISION    . COLONOSCOPY    . CORONARY ARTERY BYPASS GRAFT    . coronary stents      prior to bypass   . HERNIA REPAIR    . INGUINAL HERNIA REPAIR Bilateral 02/07/2013   Procedure: LAPAROSCOPIC BILATERAL INGUINAL HERNIA REPAIR;  Surgeon: Jeremy Burn. Georgette Dover, MD;  Location: [facility];  Service: General;  Laterality: Bilateral;  . INSERTION OF MESH Bilateral 02/07/2013   Procedure: INSERTION OF MESH;  Surgeon: Jeremy Burn. Georgette Dover, MD;  Location: [facility];  Service: General;  Laterality: Bilateral;  . PACEMAKER INSERTION    . SHOULDER SURGERY       Family History  Problem Relation Age of Onset  . Heart attack Father   . Cancer Father     renal cancer  . Hypertension Mother   . Diabetes Mother   . Diabetes Brother      Social History   Social History  . Marital status: Married    Spouse name: N/A  . Number of children: N/A  . Years of education: N/A   Occupational History  . Not on file.   Social History Main Topics  . Smoking status: Former Smoker    Quit date: 07/12/1973  . Smokeless tobacco: Never Used  . Alcohol use No  . Drug use: No  . Sexual activity: No   Other Topics Concern  . Not on file   Social History Narrative  . No narrative on file     BP 124/72    Pulse 72   Ht 5' 7.75" (1.721 m)   Wt 234 lb 3.2 oz (106.2 kg)   BMI 35.87  kg/m   Physical Exam:  Well appearing 70 year old man,NAD HEENT: Unremarkable Neck:  7 cm JVD, no thyromegally Lungs:  Clear with no wheezes, rales, or rhonchi. HEART:  Regular rate rhythm, no murmurs, no rubs, no clicks, mechanical S2 Abd:  soft, positive bowel sounds, no organomegally, no rebound, no guarding Ext:  2 plus pulses, no edema, no cyanosis, no clubbing Skin:  No rashes no nodules Neuro:  CN II through XII intact, motor grossly intact  ECG - NSR with p synchronous ventricular pacing  DEVICE  Normal device function.  See PaceArt for details.   Assess/Plan: 1. Complete heart block - he is doing well, s/p PPM insertion 2. HTN - his blood pressure is well controlled. Will follow. 3. PPM - his St. Jude DDD PM has reached ERI. We will schedule generator change out. 4. Obesity - I have encouraged the patient to lose weight. He had lost over 30 lbs in the past but gained it back.   Mikle Bosworth.D.

## 2016-04-19 NOTE — Patient Instructions (Addendum)
Medication Instructions:  Your physician recommends that you continue on your current medications as directed. Please refer to the Current Medication list given to you today.   Labwork: BMET/CBC 1-2 weeks prior   Testing/Procedures: Pacemaker generator change   Follow-Up: Follow-up for a wound check in the Device Clinic 10-14 days from 05/06/16  and with Dr. Lovena Le  91 days after  05/06/16      .    Any Other Special Instructions Will Be Listed Below ---  HOLD Coumadin 1 day prior. (Last dose 05/04/16 / hold 05/05/16 / start back on 05/06/16 night after procedure)  HOLD Metformin 24 hours prior and 48 hors after procedure (05/06/16)  Please wash with the CHG Soap the night before and morning of procedure (follow instruction page "Preparing For Surgery").   Report to the [place] [facility] at  05/06/16 at 5:30AM   Nothing to eat or drink after midnight the night before procedure  No medication day of procedure  You will need someone to drive you home after the procedure    If you need a refill on your cardiac medications before your next appointment, please call your pharmacy.

## 2016-04-19 NOTE — Telephone Encounter (Signed)
Called pt. Informed pt he should HOLD his Coumadin 1 day prior to procedure on 05/06/16. Last dose would be 05/04/16 - HOLD on 05/05/16 - next dose 05/06/16 (night after procedure). Pt verbalized understanding of information.

## 2016-04-19 NOTE — Telephone Encounter (Signed)
Called pt. Pt had a question about holding Coumadin prior to procedure. Pt did not answer. Left voice message to call us back.

## 2016-04-21 ENCOUNTER — Ambulatory Visit (INDEPENDENT_AMBULATORY_CARE_PROVIDER_SITE_OTHER): Payer: Commercial Managed Care - HMO | Admitting: Cardiology

## 2016-04-21 ENCOUNTER — Encounter: Payer: Self-pay | Admitting: Cardiology

## 2016-04-21 VITALS — BP 144/88 | HR 74 | Ht 67.75 in | Wt 229.0 lb

## 2016-04-21 DIAGNOSIS — I442 Atrioventricular block, complete: Secondary | ICD-10-CM

## 2016-04-21 DIAGNOSIS — I2510 Atherosclerotic heart disease of native coronary artery without angina pectoris: Secondary | ICD-10-CM | POA: Diagnosis not present

## 2016-04-21 DIAGNOSIS — Z951 Presence of aortocoronary bypass graft: Secondary | ICD-10-CM | POA: Diagnosis not present

## 2016-04-21 DIAGNOSIS — Z950 Presence of cardiac pacemaker: Secondary | ICD-10-CM

## 2016-04-21 DIAGNOSIS — Z7901 Long term (current) use of anticoagulants: Secondary | ICD-10-CM

## 2016-04-21 DIAGNOSIS — I2583 Coronary atherosclerosis due to lipid rich plaque: Secondary | ICD-10-CM

## 2016-04-21 DIAGNOSIS — Z952 Presence of prosthetic heart valve: Secondary | ICD-10-CM

## 2016-04-21 NOTE — Progress Notes (Signed)
Cardiology Office Note   Date:  04/21/2016   ID:  [name], DOB 06-21-1946, MRN [id number]  PCP:  Nyoka Cowden, MD  Cardiologist:   Candee Furbish, MD       History of Present Illness: [name] is a 70 y.o. male former patient of Dr. Ron Parker here for followup. Has coronary artery disease as well as pacemaker, and aortic valve disease.  Pacer ERI - changeout sched soon. Dr. [facility] stay 01/2016 - CP, neg trop. ECHO reassuring.  In 2013, echo showed normal LV function with mild inferior hypokinesis. Has a mechanical aortic valve working well. Mild mitral regurgitation. Nuclear stress test in 2013 also showed no ischemia.  Knee discomfort has been an issue. He was a Development worker, community for several years. Has lower extremity edema bilaterally, mostly on right leg. Overall doing well. Feels some mild shortness of breath.  Weight loss has been an issue for him.  No chest pain, no syncope, no bleeding.  Previously had a hernia surgery and while on Lovenox bridge protocol bled, had to go back to the hospital for 2 weeks.    Past Medical History:  Diagnosis Date  . Aortic stenosis 08/25/2010   a. Bentall aortic root replacement with a St. Jude mechanical valve and Hemashield conduit 02/2004.  Marland Kitchen Arthritis   . Asthma   . AV BLOCK, COMPLETE    a. s/p St Jude dual chamber pacemaker 02/2004.  Marland Kitchen CAD (coronary artery disease)    a. s/p CABGx2 (LIMA-dLAD, SVG-Cx). b. Low risk nuc 12/2012 without ischemia, EF 46% mild apical hypokinesia (EF 55% inf HK by echo).  . Carotid artery disease ([place])    a. 0-39% bilateral ICA stenosis, stable mild hard plaque in carotid bulbs. F/u 03/2014 recommended.  . Diabetes mellitus without complication ([place])   . DIVERTICULOSIS, COLON 10/17/2007  . Ejection fraction    a. EF 55% with inf HK, mild MR by echo 12/2012.  Marland Kitchen GERD (gastroesophageal reflux disease)    hxof   . GOUT 01/16/2007  . HEMORRHOIDS, INTERNAL 11/01/2008  . HYPERLIPIDEMIA  01/16/2007  . HYPERTENSION 01/16/2007  . Impaired glucose tolerance   . LEG EDEMA 11/27/2008  . Special screening for malignant neoplasm of prostate   . Warfarin anticoagulation    AVR    Past Surgical History:  Procedure Laterality Date  . AORTIC VALVE REPLACEMENT    . CARDIAC CATHETERIZATION  06/2003  . CIRCUMCISION    . COLONOSCOPY    . CORONARY ARTERY BYPASS GRAFT    . coronary stents      prior to bypass   . HERNIA REPAIR    . INGUINAL HERNIA REPAIR Bilateral 02/07/2013   Procedure: LAPAROSCOPIC BILATERAL INGUINAL HERNIA REPAIR;  Surgeon: Imogene Burn. Georgette Dover, MD;  Location: [facility];  Service: General;  Laterality: Bilateral;  . INSERTION OF MESH Bilateral 02/07/2013   Procedure: INSERTION OF MESH;  Surgeon: Imogene Burn. Georgette Dover, MD;  Location: [facility];  Service: General;  Laterality: Bilateral;  . PACEMAKER INSERTION    . SHOULDER SURGERY       Current Outpatient Prescriptions  Medication Sig Dispense Refill  . ACCU-CHEK FASTCLIX LANCETS MISC USE TO CHECK BLOOD SUGAR TWICE A DAY AND AS NEEDED 306 each 3  . acetaminophen (TYLENOL) 325 MG tablet Take 650 mg by mouth every 6 (six) hours as needed for mild pain.    . Ascorbic Acid (VITAMIN C) 500 MG tablet Take 1,000 mg by mouth daily.     Marland Kitchen  aspirin 81 MG tablet Take 81 mg by mouth daily.      Marland Kitchen atorvastatin (LIPITOR) 40 MG tablet Take 1 tablet (40 mg total) by mouth daily at 6 PM. 90 tablet 3  . calcium carbonate (OS-CAL) 600 MG TABS Take 600 mg by mouth 2 (two) times daily with a meal.    . cetirizine (ZYRTEC) 10 MG tablet Take 10 mg by mouth daily.     . furosemide (LASIX) 40 MG tablet Take 40 mg by mouth daily.    . Glucosamine-Chondroitin 250-200 MG CAPS Take 2 capsules by mouth daily.     Marland Kitchen glucose blood (ACCU-CHEK SMARTVIEW) test strip USE TO CHECK BLOOD SUGAR TWICE A DAY AND PRN 300 each 3  . guaiFENesin-dextromethorphan (ROBITUSSIN DM) 100-10 MG/5ML syrup Take 5 mLs by mouth every 4 (four) hours as needed for cough.    .  hydrocortisone 2.5 % lotion Apply 1 application topically 2 (two) times daily.    Marland Kitchen losartan (COZAAR) 100 MG tablet Take 1 tablet (100 mg total) by mouth daily. 90 tablet 3  . Meclizine HCl (BONINE) 25 MG CHEW Chew 25 mg by mouth daily.     . metFORMIN (GLUCOPHAGE) 1000 MG tablet TAKE 1 TABLET (1,000 MG TOTAL) BY MOUTH 2 (TWO) TIMES DAILY WITH A MEAL. 180 tablet 3  . metoprolol succinate (TOPROL-XL) 100 MG 24 hr tablet Take 100 mg by mouth every morning. Take with or immediately following a meal.    . Misc Natural Products (TART CHERRY ADVANCED) CAPS Take 2 capsules by mouth daily.     . Multiple Vitamin (MULTIVITAMIN) tablet Take 1 tablet by mouth daily.      . nitroGLYCERIN (NITROSTAT) 0.4 MG SL tablet Place 1 tablet (0.4 mg total) under the tongue every 5 (five) minutes as needed. For chest pain 25 tablet prn  . Omega-3 Fatty Acids (FISH OIL) 1000 MG CAPS Take 1,000 mg by mouth 2 (two) times daily.     . potassium chloride (K-DUR) 10 MEQ tablet Take 10 mEq by mouth daily.     . traMADol (ULTRAM) 50 MG tablet Take 50 mg by mouth every 6 (six) hours as needed for pain.     . vitamin B-12 (CYANOCOBALAMIN) 1000 MCG tablet Take 1,000 mcg by mouth daily.    Marland Kitchen warfarin (COUMADIN) 10 MG tablet TAKE AS DIRECTED BY COUMADIN CLINIC 90 tablet 1   No current facility-administered medications for this visit.     Allergies:   Codeine phosphate; Metoprolol tartrate; Other; Poison ivy extract; Poison oak extract [poison oak extract]; and Ramipril    Social History:  The patient  reports that he quit smoking about 42 years ago. He has never used smokeless tobacco. He reports that he does not drink alcohol or use drugs. Retired Development worker, community    Family History:  The patient's family history includes Cancer in his father; Diabetes in his brother and mother; Heart attack in his father; Hypertension in his mother.    ROS:  Please see the history of present illness.   Otherwise, review of systems are positive for  none.   All other systems are reviewed and negative.    PHYSICAL EXAM: VS:  BP (!) 144/88   Pulse 74   Ht 5' 7.75" (1.721 m)   Wt 229 lb (103.9 kg)   BMI 35.08 kg/m  , BMI Body mass index is 35.08 kg/m. GEN: Well nourished, well developed, in no acute distress  HEENT: normal  Neck: no JVD, carotid bruits,  or masses Cardiac: RRR; no murmurs, rubs, or gallops, 1+BLE  edema S2 CLICK Respiratory:  clear to auscultation bilaterally, normal work of breathing GI: soft, nontender, nondistended, + BS, obese MS: no deformity or atrophy  Skin: warm and dry, no rash Neuro:  Strength and sensation are intact Psych: euthymic mood, full affect   EKG:  EKG is not ordered today.  Recent Labs: 04/23/2015: ALT 26; TSH 1.49 01/26/2016: B Natriuretic Peptide 102.9; BUN 14; Creatinine, Ser 0.96; Hemoglobin 13.6; Platelets 204; Potassium 4.0; Sodium 138    Lipid Panel    Component Value Date/Time   CHOL 130 04/06/2016 0845   TRIG 112.0 04/06/2016 0845   TRIG 91 06/14/2006 0825   HDL 46.00 04/06/2016 0845   CHOLHDL 3 04/06/2016 0845   VLDL 22.4 04/06/2016 0845   LDLCALC 62 04/06/2016 0845      Wt Readings from Last 3 Encounters:  04/21/16 229 lb (103.9 kg)  04/19/16 234 lb 3.2 oz (106.2 kg)  04/06/16 232 lb 8 oz (105.5 kg)    04/2014-vascular study, carotids Heterogeneous plaque, bilaterally. Stable, 1-39% bilateral ICA stenosis. Patent vertebral arteries with antegrade flow. Normal subclavian arteries, bilaterally. f/u 2 years.  Other studies Reviewed: ECHO 02/06/16: - Left ventricle: LVEF is approximately 55% with hypokinesis of the   distal inferior/inferoseptal walls. The cavity size was normal.   Wall thickness was normal. Doppler parameters are consistent with   abnormal left ventricular relaxation (grade 1 diastolic   dysfunction). - Aortic valve: AV prosthesis appears to move well Peak and mean   gradients through the valve are 16 and 10 mm Hg respectively.   There was  trivial regurgitation. Valve area (VTI): 2.77 cm^2.   Valve area (Vmax): 2.63 cm^2. Valve area (Vmean): 2.55 cm^2. - Right ventricle: The cavity size was mildly dilated. Wall   thickness was normal. - Right atrium: The atrium was mildly dilated.  Additional studies/ records that were reviewed today include: Prior office notes reviewed, lab work reviewed, previous LDL 121. Review of the above records demonstrates: As above   ASSESSMENT AND PLAN:  1.  Aortic stenosis-history of aortic valve replacement in 2005, Bentall, mechanical. Echo 2017 functioning well.  2. History of aortic valve replacement, mechanical-2005 as above. Functioning well. Echocardiogram reviewed.  3. Coronary artery disease-bypass surgery at the time of aortic valve replacement 2005. 2013 no ischemia on nuclear stress test. Secondary prevention.  4. Hyperlipidemia-previously changed from Pravachol to atorvastatin, guideline directed therapy. LDL goal 70. Dr. Beverely Risen has been checking labs.  5. Carotid artery disease-mild plaque previously. Stable disease. Mild plaque bilaterally.  6. Pacemaker-change out sched 05/06/16 - Dr. Lovena Le. Visit reviewed.   7. Warfarin-previously noted that he was anticoagulated for his aortic valve prosthesis.  8. Obesity-continue to encourage weight loss, decrease carbohydrates. Weight loss will help with his diabetes, taking metformin, diet controlled. This will also help with his lower extremity edema     Current medicines are reviewed at length with the patient today.  The patient does not have concerns regarding medicines.  The following changes have been made:  no change  Labs/ tests ordered today include: none  No orders of the defined types were placed in this encounter.    Disposition:   FU with Josette Shimabukuro in 1 year  Signed, Candee Furbish, MD  04/21/2016 9:43 AM    [facility] [address], [place], [place]  [address] Phone: [phone]; Fax:  [phone]

## 2016-04-21 NOTE — Patient Instructions (Signed)
Medication Instructions:  The current medical regimen is effective;  continue present plan and medications.  Follow-Up: Follow up in 6 months with Katy Thompson, PA.  You will receive a letter in the mail 2 months before you are due.  Please call us when you receive this letter to schedule your follow up appointment.  Follow up in 1 year with Dr. Skains.  You will receive a letter in the mail 2 months before you are due.  Please call us when you receive this letter to schedule your follow up appointment.  If you need a refill on your cardiac medications before your next appointment, please call your pharmacy.  Thank you for choosing [facility]!!     

## 2016-04-29 ENCOUNTER — Other Ambulatory Visit: Payer: Commercial Managed Care - HMO | Admitting: *Deleted

## 2016-04-29 DIAGNOSIS — Z79899 Other long term (current) drug therapy: Secondary | ICD-10-CM | POA: Diagnosis not present

## 2016-04-29 DIAGNOSIS — Z950 Presence of cardiac pacemaker: Secondary | ICD-10-CM | POA: Diagnosis not present

## 2016-04-29 LAB — BASIC METABOLIC PANEL
BUN: 18 mg/dL (ref 7–25)
CHLORIDE: 105 mmol/L (ref 98–110)
CO2: 29 mmol/L (ref 20–31)
Calcium: 8.6 mg/dL (ref 8.6–10.3)
Creat: 0.87 mg/dL (ref 0.70–1.18)
GLUCOSE: 110 mg/dL — AB (ref 65–99)
POTASSIUM: 4.1 mmol/L (ref 3.5–5.3)
SODIUM: 140 mmol/L (ref 135–146)

## 2016-04-29 LAB — CBC WITH DIFFERENTIAL/PLATELET
BASOS ABS: 0 {cells}/uL (ref 0–200)
Basophils Relative: 0 %
EOS ABS: 142 {cells}/uL (ref 15–500)
EOS PCT: 2 %
HEMATOCRIT: 38.7 % (ref 38.5–50.0)
HEMOGLOBIN: 13 g/dL — AB (ref 13.2–17.1)
LYMPHS ABS: 1775 {cells}/uL (ref 850–3900)
Lymphocytes Relative: 25 %
MCH: 28.3 pg (ref 27.0–33.0)
MCHC: 33.6 g/dL (ref 32.0–36.0)
MCV: 84.1 fL (ref 80.0–100.0)
MONO ABS: 568 {cells}/uL (ref 200–950)
MPV: 10.6 fL (ref 7.5–12.5)
Monocytes Relative: 8 %
NEUTROS ABS: 4615 {cells}/uL (ref 1500–7800)
NEUTROS PCT: 65 %
Platelets: 208 10*3/uL (ref 140–400)
RBC: 4.6 MIL/uL (ref 4.20–5.80)
RDW: 14.3 % (ref 11.0–15.0)
WBC: 7.1 10*3/uL (ref 3.8–10.8)

## 2016-05-06 ENCOUNTER — Ambulatory Visit (HOSPITAL_COMMUNITY)
Admission: RE | Admit: 2016-05-06 | Discharge: 2016-05-06 | Disposition: A | Discharge status: Home / Self Care | Payer: Commercial Managed Care - HMO | Source: Ambulatory Visit | Attending: Internal Medicine | Admitting: Internal Medicine

## 2016-05-06 ENCOUNTER — Encounter (HOSPITAL_COMMUNITY): Payer: Self-pay | Admitting: Internal Medicine

## 2016-05-06 ENCOUNTER — Encounter (HOSPITAL_COMMUNITY)
Admission: RE | Disposition: A | Discharge status: Home / Self Care | Payer: Self-pay | Source: Ambulatory Visit | Attending: Internal Medicine

## 2016-05-06 DIAGNOSIS — Z87891 Personal history of nicotine dependence: Secondary | ICD-10-CM | POA: Insufficient documentation

## 2016-05-06 DIAGNOSIS — Z7982 Long term (current) use of aspirin: Secondary | ICD-10-CM | POA: Diagnosis not present

## 2016-05-06 DIAGNOSIS — Z45010 Encounter for checking and testing of cardiac pacemaker pulse generator [battery]: Secondary | ICD-10-CM | POA: Insufficient documentation

## 2016-05-06 DIAGNOSIS — I2510 Atherosclerotic heart disease of native coronary artery without angina pectoris: Secondary | ICD-10-CM | POA: Insufficient documentation

## 2016-05-06 DIAGNOSIS — E785 Hyperlipidemia, unspecified: Secondary | ICD-10-CM | POA: Insufficient documentation

## 2016-05-06 DIAGNOSIS — Z952 Presence of prosthetic heart valve: Secondary | ICD-10-CM | POA: Insufficient documentation

## 2016-05-06 DIAGNOSIS — E669 Obesity, unspecified: Secondary | ICD-10-CM | POA: Diagnosis not present

## 2016-05-06 DIAGNOSIS — M1990 Unspecified osteoarthritis, unspecified site: Secondary | ICD-10-CM | POA: Insufficient documentation

## 2016-05-06 DIAGNOSIS — E119 Type 2 diabetes mellitus without complications: Secondary | ICD-10-CM | POA: Diagnosis not present

## 2016-05-06 DIAGNOSIS — Z7901 Long term (current) use of anticoagulants: Secondary | ICD-10-CM | POA: Insufficient documentation

## 2016-05-06 DIAGNOSIS — I10 Essential (primary) hypertension: Secondary | ICD-10-CM | POA: Diagnosis not present

## 2016-05-06 DIAGNOSIS — Z7984 Long term (current) use of oral hypoglycemic drugs: Secondary | ICD-10-CM | POA: Diagnosis not present

## 2016-05-06 DIAGNOSIS — Z6835 Body mass index (BMI) 35.0-35.9, adult: Secondary | ICD-10-CM | POA: Insufficient documentation

## 2016-05-06 DIAGNOSIS — Z8051 Family history of malignant neoplasm of kidney: Secondary | ICD-10-CM | POA: Diagnosis not present

## 2016-05-06 DIAGNOSIS — M109 Gout, unspecified: Secondary | ICD-10-CM | POA: Diagnosis not present

## 2016-05-06 DIAGNOSIS — Z951 Presence of aortocoronary bypass graft: Secondary | ICD-10-CM | POA: Diagnosis not present

## 2016-05-06 DIAGNOSIS — J45909 Unspecified asthma, uncomplicated: Secondary | ICD-10-CM | POA: Diagnosis not present

## 2016-05-06 DIAGNOSIS — Z8249 Family history of ischemic heart disease and other diseases of the circulatory system: Secondary | ICD-10-CM | POA: Diagnosis not present

## 2016-05-06 DIAGNOSIS — I6523 Occlusion and stenosis of bilateral carotid arteries: Secondary | ICD-10-CM | POA: Insufficient documentation

## 2016-05-06 DIAGNOSIS — Z833 Family history of diabetes mellitus: Secondary | ICD-10-CM | POA: Diagnosis not present

## 2016-05-06 DIAGNOSIS — I442 Atrioventricular block, complete: Secondary | ICD-10-CM | POA: Diagnosis not present

## 2016-05-06 DIAGNOSIS — Z950 Presence of cardiac pacemaker: Secondary | ICD-10-CM | POA: Diagnosis present

## 2016-05-06 DIAGNOSIS — K219 Gastro-esophageal reflux disease without esophagitis: Secondary | ICD-10-CM | POA: Insufficient documentation

## 2016-05-06 HISTORY — PX: EP IMPLANTABLE DEVICE: SHX172B

## 2016-05-06 LAB — PROTIME-INR
INR: 1.31
PROTHROMBIN TIME: 16.4 s — AB (ref 11.4–15.2)

## 2016-05-06 LAB — GLUCOSE, CAPILLARY: Glucose-Capillary: 119 mg/dL — ABNORMAL HIGH (ref 65–99)

## 2016-05-06 LAB — SURGICAL PCR SCREEN
MRSA, PCR: NEGATIVE
Staphylococcus aureus: NEGATIVE

## 2016-05-06 SURGERY — PPM/BIV PPM GENERATOR CHANGEOUT

## 2016-05-06 MED ORDER — FENTANYL CITRATE (PF) 100 MCG/2ML IJ SOLN
INTRAMUSCULAR | Status: DC | PRN
  Administered 2016-05-06: 12.5 ug via INTRAVENOUS
  Administered 2016-05-06: 25 ug via INTRAVENOUS
  Administered 2016-05-06: 12.5 ug via INTRAVENOUS

## 2016-05-06 MED ORDER — MIDAZOLAM HCL 5 MG/5ML IJ SOLN
INTRAMUSCULAR | Status: DC | PRN
Start: 2016-05-06 — End: 2016-05-06
  Administered 2016-05-06 (×4): 1 mg via INTRAVENOUS

## 2016-05-06 MED ORDER — SODIUM CHLORIDE 0.9 % IV SOLN
INTRAVENOUS | Status: DC
  Administered 2016-05-06: 07:00:00 via INTRAVENOUS

## 2016-05-06 MED ORDER — ONDANSETRON HCL 4 MG/2ML IJ SOLN: 4.0000 mg | Freq: Four times a day (QID) | INTRAMUSCULAR | Status: DC | PRN

## 2016-05-06 MED ORDER — SODIUM CHLORIDE 0.9 % IR SOLN
80.0000 mg | Status: AC
  Administered 2016-05-06: 80 mg
  Filled 2016-05-06: qty 2

## 2016-05-06 MED ORDER — CHLORHEXIDINE GLUCONATE 4 % EX LIQD
60.0000 mL | Freq: Once | CUTANEOUS | Status: DC
  Filled 2016-05-06: qty 60

## 2016-05-06 MED ORDER — ACETAMINOPHEN 325 MG PO TABS
325.0000 mg | ORAL_TABLET | ORAL | Status: DC | PRN
  Filled 2016-05-06: qty 2

## 2016-05-06 MED ORDER — FENTANYL CITRATE (PF) 100 MCG/2ML IJ SOLN
INTRAMUSCULAR | Status: AC
  Filled 2016-05-06: qty 2

## 2016-05-06 MED ORDER — MUPIROCIN 2 % EX OINT
TOPICAL_OINTMENT | CUTANEOUS | Status: AC
  Administered 2016-05-06: 1 via TOPICAL
  Filled 2016-05-06: qty 22

## 2016-05-06 MED ORDER — MIDAZOLAM HCL 5 MG/5ML IJ SOLN
INTRAMUSCULAR | Status: AC
  Filled 2016-05-06: qty 5

## 2016-05-06 MED ORDER — CEFAZOLIN SODIUM-DEXTROSE 2-4 GM/100ML-% IV SOLN
2.0000 g | INTRAVENOUS | Status: AC
  Administered 2016-05-06: 2 g via INTRAVENOUS
  Filled 2016-05-06: qty 100

## 2016-05-06 MED ORDER — CEFAZOLIN SODIUM-DEXTROSE 2-4 GM/100ML-% IV SOLN
INTRAVENOUS | Status: AC
  Filled 2016-05-06: qty 100

## 2016-05-06 MED ORDER — SODIUM CHLORIDE 0.9 % IR SOLN
Status: AC
  Filled 2016-05-06: qty 2

## 2016-05-06 MED ORDER — LIDOCAINE HCL (PF) 1 % IJ SOLN
INTRAMUSCULAR | Status: DC | PRN
  Administered 2016-05-06: 36 mL via INTRADERMAL

## 2016-05-06 MED ORDER — LIDOCAINE HCL (PF) 1 % IJ SOLN
INTRAMUSCULAR | Status: AC
  Filled 2016-05-06: qty 60

## 2016-05-06 MED ORDER — MUPIROCIN 2 % EX OINT
1.0000 "application " | TOPICAL_OINTMENT | Freq: Once | CUTANEOUS | Status: AC
  Administered 2016-05-06: 1 via TOPICAL
  Filled 2016-05-06: qty 22

## 2016-05-06 SURGICAL SUPPLY — 4 items
CABLE SURGICAL S-101-97-12 (CABLE) ×2 IMPLANT
PACEMAKER ASSURITY DR-RF (Pacemaker) ×2 IMPLANT
PAD DEFIB LIFELINK (PAD) ×2 IMPLANT
TRAY PACEMAKER INSERTION (PACKS) ×2 IMPLANT

## 2016-05-06 NOTE — Interval H&P Note (Signed)
History and Physical Interval Note:  05/06/2016 7:36 AM  [name]  has presented today for surgery, with the diagnosis of av block  The various methods of treatment have been discussed with the patient and family. After consideration of risks, benefits and other options for treatment, the patient has consented to  Procedure(s): PPM Generator Changeout (N/A) as a surgical intervention .  The patient's history has been reviewed, patient examined, no change in status, stable for surgery.  I have reviewed the patient's chart and labs.  Questions were answered to the patient's satisfaction.     [name]

## 2016-05-06 NOTE — Discharge Instructions (Signed)
Pacemaker Battery Change °A pacemaker battery usually lasts 4 to 12 years. Once or twice per year, you will be asked to visit your health care provider to have a full evaluation of your pacemaker. When a battery needs to be replaced, the entire pacemaker is replaced so that you can benefit from new circuitry and any new features that have been added to pacemakers. Most often, this procedure is very simple because the leads are already in place.  °There are many things that affect how long a pacemaker battery will last, including:  °· The age of the pacemaker.   °· The number of leads (1, 2, or 3).   °· The pacemaker work load. If the pacemaker is helping the heart more often, the battery will not last as long as it would if the pacemaker did not need to help the heart.   °· Power (voltage) settings.  °LET YOUR HEALTH CARE PROVIDER KNOW ABOUT:  °· Any allergies you have.   °· All medicines you are taking, including vitamins, herbs, eye drops, creams, and over-the-counter medicines.   °· Previous problems you or members of your family have had with the use of anesthetics.   °· Any blood disorders you have.   °· Previous surgeries you have had, especially since your last pacemaker placement.   °· Medical conditions you have.   °· Possibility of pregnancy, if this applies. °· Symptoms of chest pain, trouble breathing, palpitations, light-headedness, or feelings of an abnormal or irregular heartbeat. °RISKS AND COMPLICATIONS  °Generally, this is a safe procedure. However, as with any procedure, problems can occur and include:  °· Bleeding.   °· Bruising of the skin around where the incision was made.   °· Pain at the incision site.   °· Pulling apart of the skin at the incision site.   °· Infection.   °· Allergic reaction to anesthetics or other medicines used during the procedure.   °People with diabetes may have a temporary increase in their blood sugar after any surgical procedure.  °BEFORE THE PROCEDURE  °· Wash all  of the skin around the area of the chest where the pacemaker is located.   °· Ask your health care provider for help with any medicine adjustments before the pacemaker is replaced.   °· Do not eat or drink anything after midnight on the night before the procedure or as directed by your health care provider. °· Ask your health care provider if you can take a sip of water with any approved medicines the morning of the procedure. °PROCEDURE  °· After giving medicine to numb the skin (local anesthetic), your health care provider will make a cut to reopen the pocket holding the pacemaker.   °· The old pacemaker will be disconnected from its leads.   °· The leads will be tested.   °· If needed, the leads will be replaced. If the leads are functioning properly, the new pacemaker may be connected to the existing leads. °· A heart monitor and the pacemaker programmer will be used to make sure that the new pacemaker is working properly. °· The incision site will then be closed. A dressing will be placed over the pacemaker site. The dressing will be removed 24-48 hours afterward. °AFTER THE PROCEDURE  °· You will be taken to a recovery area after the new pacemaker implant is completed. Your vital signs such as blood pressure, heart rate, breathing, and oxygen levels will be monitored. °· Your health care provider will tell you when you will need to next test your pacemaker or when to return to the office for follow-up   for removal of stitches. °  °This information is not intended to replace advice given to you by your health care provider. Make sure you discuss any questions you have with your health care provider. °  °Document Released: 10/06/2006 Document Revised: 07/19/2014 Document Reviewed: 01/10/2013 °Elsevier Interactive Patient Education ©2016 [employer] ° °

## 2016-05-06 NOTE — H&P (View-Only) (Signed)
HPI [name] returns today for followup. He is a very pleasant 70 year old man with a history of symptomatic bradycardia due to complete heart block, status post permanent pacemaker insertion. He is s/p Bentall with CHB. He has done well in the interim. He has lost over 20 pounds. His goal is to get down to 200 pounds. He denies chest pain, shortness of breath, or peripheral edema. No cough or hemoptysis. He is considering knee surgery. He is approaching device ERI. Allergies  Allergen Reactions  . Codeine Phosphate Hives, Shortness Of Breath, Itching, Rash and Other (See Comments)    REACTION: difficulty breathing  . Metoprolol Tartrate Other (See Comments)    REACTION: loss of vision. Can take the XL tablet not regular   . Other     Bumble bees - has to lay down  . Poison Ivy Extract     Severe rash and hives all over body  . Poison Oak Extract [Poison Oak Extract]     Severe rash and hives all over body  . Ramipril Cough     Current Outpatient Prescriptions  Medication Sig Dispense Refill  . ACCU-CHEK FASTCLIX LANCETS MISC USE TO CHECK BLOOD SUGAR TWICE A DAY AND AS NEEDED 306 each 3  . acetaminophen (TYLENOL) 325 MG tablet Take 650 mg by mouth every 6 (six) hours as needed for mild pain.    . Ascorbic Acid (VITAMIN C) 500 MG tablet Take 1,000 mg by mouth daily.     Marland Kitchen aspirin 81 MG tablet Take 81 mg by mouth daily.      Marland Kitchen atorvastatin (LIPITOR) 40 MG tablet Take 1 tablet (40 mg total) by mouth daily at 6 PM. 90 tablet 3  . calcium carbonate (OS-CAL) 600 MG TABS Take 600 mg by mouth 2 (two) times daily with a meal.    . cetirizine (ZYRTEC) 10 MG tablet Take 10 mg by mouth daily.     . furosemide (LASIX) 40 MG tablet Take 40 mg by mouth daily.    . Glucosamine-Chondroitin 250-200 MG CAPS Take 2 capsules by mouth daily.     Marland Kitchen glucose blood (ACCU-CHEK SMARTVIEW) test strip USE TO CHECK BLOOD SUGAR TWICE A DAY AND PRN 300 each 3  . guaiFENesin-dextromethorphan (ROBITUSSIN DM) 100-10  MG/5ML syrup Take 5 mLs by mouth every 4 (four) hours as needed for cough.    . hydrocortisone 2.5 % lotion Apply 1 application topically 2 (two) times daily.    Marland Kitchen losartan (COZAAR) 100 MG tablet Take 1 tablet (100 mg total) by mouth daily. 90 tablet 2  . Meclizine HCl (BONINE) 25 MG CHEW Chew 25 mg by mouth daily.     . metFORMIN (GLUCOPHAGE) 1000 MG tablet TAKE 1 TABLET (1,000 MG TOTAL) BY MOUTH 2 (TWO) TIMES DAILY WITH A MEAL. 180 tablet 3  . metoprolol succinate (TOPROL-XL) 100 MG 24 hr tablet Take 100 mg by mouth every morning. Take with or immediately following a meal.    . Misc Natural Products (TART CHERRY ADVANCED) CAPS Take 2 capsules by mouth daily.     . Multiple Vitamin (MULTIVITAMIN) tablet Take 1 tablet by mouth daily.      . nitroGLYCERIN (NITROSTAT) 0.4 MG SL tablet Place 1 tablet (0.4 mg total) under the tongue every 5 (five) minutes as needed. For chest pain 25 tablet prn  . Omega-3 Fatty Acids (FISH OIL) 1000 MG CAPS Take 1,000 mg by mouth 2 (two) times daily.     . potassium chloride (K-DUR) 10 MEQ  tablet Take 10 mEq by mouth daily.     . traMADol (ULTRAM) 50 MG tablet Take 50 mg by mouth every 6 (six) hours as needed for pain.     . vitamin B-12 (CYANOCOBALAMIN) 1000 MCG tablet Take 1,000 mcg by mouth daily.    Marland Kitchen warfarin (COUMADIN) 10 MG tablet TAKE AS DIRECTED BY COUMADIN CLINIC 90 tablet 1   No current facility-administered medications for this visit.      Past Medical History:  Diagnosis Date  . Aortic stenosis 08/25/2010   a. Bentall aortic root replacement with a St. Jude mechanical valve and Hemashield conduit 02/2004.  Marland Kitchen Arthritis   . Asthma   . AV BLOCK, COMPLETE    a. s/p St Jude dual chamber pacemaker 02/2004.  Marland Kitchen CAD (coronary artery disease)    a. s/p CABGx2 (LIMA-dLAD, SVG-Cx). b. Low risk nuc 12/2012 without ischemia, EF 46% mild apical hypokinesia (EF 55% inf HK by echo).  . Carotid artery disease ([place])    a. 0-39% bilateral ICA stenosis, stable mild  hard plaque in carotid bulbs. F/u 03/2014 recommended.  . Diabetes mellitus without complication ([place])   . DIVERTICULOSIS, COLON 10/17/2007  . Ejection fraction    a. EF 55% with inf HK, mild MR by echo 12/2012.  Marland Kitchen GERD (gastroesophageal reflux disease)    hxof   . GOUT 01/16/2007  . HEMORRHOIDS, INTERNAL 11/01/2008  . HYPERLIPIDEMIA 01/16/2007  . HYPERTENSION 01/16/2007  . Impaired glucose tolerance   . LEG EDEMA 11/27/2008  . Special screening for malignant neoplasm of prostate   . Warfarin anticoagulation    AVR    ROS:   All systems reviewed and negative except as noted in the HPI.   Past Surgical History:  Procedure Laterality Date  . AORTIC VALVE REPLACEMENT    . CARDIAC CATHETERIZATION  06/2003  . CIRCUMCISION    . COLONOSCOPY    . CORONARY ARTERY BYPASS GRAFT    . coronary stents      prior to bypass   . HERNIA REPAIR    . INGUINAL HERNIA REPAIR Bilateral 02/07/2013   Procedure: LAPAROSCOPIC BILATERAL INGUINAL HERNIA REPAIR;  Surgeon: Imogene Burn. Georgette Dover, MD;  Location: [facility];  Service: General;  Laterality: Bilateral;  . INSERTION OF MESH Bilateral 02/07/2013   Procedure: INSERTION OF MESH;  Surgeon: Imogene Burn. Georgette Dover, MD;  Location: [facility];  Service: General;  Laterality: Bilateral;  . PACEMAKER INSERTION    . SHOULDER SURGERY       Family History  Problem Relation Age of Onset  . Heart attack Father   . Cancer Father     renal cancer  . Hypertension Mother   . Diabetes Mother   . Diabetes Brother      Social History   Social History  . Marital status: Married    Spouse name: N/A  . Number of children: N/A  . Years of education: N/A   Occupational History  . Not on file.   Social History Main Topics  . Smoking status: Former Smoker    Quit date: 07/12/1973  . Smokeless tobacco: Never Used  . Alcohol use No  . Drug use: No  . Sexual activity: No   Other Topics Concern  . Not on file   Social History Narrative  . No narrative on file     BP 124/72    Pulse 72   Ht 5' 7.75" (1.721 m)   Wt 234 lb 3.2 oz (106.2 kg)   BMI 35.87  kg/m   Physical Exam:  Well appearing 70 year old man,NAD HEENT: Unremarkable Neck:  7 cm JVD, no thyromegally Lungs:  Clear with no wheezes, rales, or rhonchi. HEART:  Regular rate rhythm, no murmurs, no rubs, no clicks, mechanical S2 Abd:  soft, positive bowel sounds, no organomegally, no rebound, no guarding Ext:  2 plus pulses, no edema, no cyanosis, no clubbing Skin:  No rashes no nodules Neuro:  CN II through XII intact, motor grossly intact  ECG - NSR with p synchronous ventricular pacing  DEVICE  Normal device function.  See PaceArt for details.   Assess/Plan: 1. Complete heart block - he is doing well, s/p PPM insertion 2. HTN - his blood pressure is well controlled. Will follow. 3. PPM - his St. Jude DDD PM has reached ERI. We will schedule generator change out. 4. Obesity - I have encouraged the patient to lose weight. He had lost over 30 lbs in the past but gained it back.   [name].D.

## 2016-05-12 ENCOUNTER — Ambulatory Visit (INDEPENDENT_AMBULATORY_CARE_PROVIDER_SITE_OTHER): Payer: Commercial Managed Care - HMO | Admitting: *Deleted

## 2016-05-12 DIAGNOSIS — Z7901 Long term (current) use of anticoagulants: Secondary | ICD-10-CM | POA: Diagnosis not present

## 2016-05-12 DIAGNOSIS — Z5181 Encounter for therapeutic drug level monitoring: Secondary | ICD-10-CM | POA: Diagnosis not present

## 2016-05-12 DIAGNOSIS — I359 Nonrheumatic aortic valve disorder, unspecified: Secondary | ICD-10-CM | POA: Diagnosis not present

## 2016-05-12 DIAGNOSIS — Z952 Presence of prosthetic heart valve: Secondary | ICD-10-CM | POA: Diagnosis not present

## 2016-05-12 LAB — POCT INR: INR: 1.5

## 2016-05-17 ENCOUNTER — Ambulatory Visit (INDEPENDENT_AMBULATORY_CARE_PROVIDER_SITE_OTHER): Payer: Commercial Managed Care - HMO

## 2016-05-17 ENCOUNTER — Ambulatory Visit (INDEPENDENT_AMBULATORY_CARE_PROVIDER_SITE_OTHER): Payer: Commercial Managed Care - HMO | Admitting: *Deleted

## 2016-05-17 DIAGNOSIS — I442 Atrioventricular block, complete: Secondary | ICD-10-CM

## 2016-05-17 DIAGNOSIS — Z950 Presence of cardiac pacemaker: Secondary | ICD-10-CM

## 2016-05-17 DIAGNOSIS — Z5181 Encounter for therapeutic drug level monitoring: Secondary | ICD-10-CM

## 2016-05-17 DIAGNOSIS — Z7901 Long term (current) use of anticoagulants: Secondary | ICD-10-CM

## 2016-05-17 DIAGNOSIS — Z952 Presence of prosthetic heart valve: Secondary | ICD-10-CM

## 2016-05-17 DIAGNOSIS — I359 Nonrheumatic aortic valve disorder, unspecified: Secondary | ICD-10-CM

## 2016-05-17 LAB — CUP PACEART INCLINIC DEVICE CHECK
Battery Remaining Longevity: 132 mo
Battery Voltage: 3.16 V
Brady Statistic RA Percent Paced: 32 %
Brady Statistic RV Percent Paced: 99.65 %
Date Time Interrogation Session: 20171106112058
Implantable Lead Implant Date: 20050816
Implantable Lead Location: 753860
Lead Channel Pacing Threshold Amplitude: 1 V
Lead Channel Pacing Threshold Pulse Width: 0.5 ms
Lead Channel Sensing Intrinsic Amplitude: 2.8 mV
Lead Channel Setting Pacing Amplitude: 0.875
Lead Channel Setting Pacing Pulse Width: 0.5 ms
MDC IDC LEAD IMPLANT DT: 20050816
MDC IDC LEAD LOCATION: 753859
MDC IDC MSMT LEADCHNL RA IMPEDANCE VALUE: 487.5 Ohm
MDC IDC MSMT LEADCHNL RV IMPEDANCE VALUE: 725 Ohm
MDC IDC MSMT LEADCHNL RV PACING THRESHOLD AMPLITUDE: 0.625 V
MDC IDC MSMT LEADCHNL RV PACING THRESHOLD PULSEWIDTH: 0.5 ms
MDC IDC PG IMPLANT DT: 20171026
MDC IDC SET LEADCHNL RA PACING AMPLITUDE: 2 V
MDC IDC SET LEADCHNL RV SENSING SENSITIVITY: 4 mV
Pulse Gen Model: 2272
Pulse Gen Serial Number: 3182792

## 2016-05-17 LAB — POCT INR: INR: 2.3

## 2016-05-17 NOTE — Progress Notes (Signed)
Wound check appointment. Steri-strips removed. Wound without redness or edema. Incision edges approximated, wound well healed. Normal device function. Thresholds, sensing, and impedances consistent with implant measurements. Device programmed with auto capture on. Histogram distribution appropriate for patient and level of activity. 1 mode switch--AT per EGM, duration 10 sec. No high ventricular rates noted. Patient educated about wound care, arm mobility, lifting restrictions, and home monitor. ROV with GT on 08/10/16.

## 2016-05-25 DIAGNOSIS — M170 Bilateral primary osteoarthritis of knee: Secondary | ICD-10-CM | POA: Diagnosis not present

## 2016-06-07 ENCOUNTER — Ambulatory Visit (INDEPENDENT_AMBULATORY_CARE_PROVIDER_SITE_OTHER)
Admission: RE | Admit: 2016-06-07 | Discharge: 2016-06-07 | Disposition: A | Discharge status: Home / Self Care | Payer: Commercial Managed Care - HMO | Source: Ambulatory Visit | Attending: Family Medicine | Admitting: Family Medicine

## 2016-06-07 ENCOUNTER — Ambulatory Visit (INDEPENDENT_AMBULATORY_CARE_PROVIDER_SITE_OTHER): Payer: Commercial Managed Care - HMO | Admitting: Family Medicine

## 2016-06-07 ENCOUNTER — Encounter: Payer: Self-pay | Admitting: Family Medicine

## 2016-06-07 ENCOUNTER — Ambulatory Visit (INDEPENDENT_AMBULATORY_CARE_PROVIDER_SITE_OTHER): Payer: Commercial Managed Care - HMO | Admitting: *Deleted

## 2016-06-07 DIAGNOSIS — R062 Wheezing: Secondary | ICD-10-CM

## 2016-06-07 DIAGNOSIS — J189 Pneumonia, unspecified organism: Secondary | ICD-10-CM

## 2016-06-07 DIAGNOSIS — I359 Nonrheumatic aortic valve disorder, unspecified: Secondary | ICD-10-CM | POA: Diagnosis not present

## 2016-06-07 DIAGNOSIS — Z952 Presence of prosthetic heart valve: Secondary | ICD-10-CM

## 2016-06-07 DIAGNOSIS — R05 Cough: Secondary | ICD-10-CM | POA: Diagnosis not present

## 2016-06-07 DIAGNOSIS — Z5181 Encounter for therapeutic drug level monitoring: Secondary | ICD-10-CM | POA: Diagnosis not present

## 2016-06-07 DIAGNOSIS — Z7901 Long term (current) use of anticoagulants: Secondary | ICD-10-CM | POA: Diagnosis not present

## 2016-06-07 LAB — POCT INR: INR: 2

## 2016-06-07 MED ORDER — ALBUTEROL SULFATE (2.5 MG/3ML) 0.083% IN NEBU: 2.5000 mg | INHALATION_SOLUTION | Freq: Once | RESPIRATORY_TRACT | Status: DC

## 2016-06-07 MED ORDER — DOXYCYCLINE HYCLATE 100 MG PO TABS
100.0000 mg | ORAL_TABLET | Freq: Two times a day (BID) | ORAL | 0 refills | Status: DC
Start: 2016-06-07 — End: 2016-10-11

## 2016-06-07 MED ORDER — ALBUTEROL SULFATE (2.5 MG/3ML) 0.083% IN NEBU
2.5000 mg | INHALATION_SOLUTION | Freq: Once | RESPIRATORY_TRACT | Status: AC
  Administered 2016-06-07: 2.5 mg via RESPIRATORY_TRACT

## 2016-06-07 NOTE — Progress Notes (Signed)
Subjective:    Patient ID: [name], male    DOB: [birth year], 70 y.o.   MRN: [id number]  HPI  [name] is a 70 year old male who presents today with nasal congestion and sinus pressure for 2 weeks.  Associated cough with yellow/brown sputum and SOB that occurs with coughing at times per patient, rhinitis with yellow drainage. He denies history of bronchitis/asthma. His history includes CAD, pacemaker, and aortic valve disease with mechanical aortic valve replacement in 2005. He denies fever, chills, sweats, ear pain, N/V/D. Recent sick contact exposure with mother in law.  No recent antibiotic use. Treatment at home with Mucinex, robitussin, Tylenol has provided minimal benefit. He denies use of albuterol inhaler at this time. He reports that symptoms are not improving. No aggravating or relieving factors noted.  Review of Systems  Constitutional: Negative for chills and fever.  HENT: Positive for congestion, rhinorrhea and sinus pressure. Negative for postnasal drip and sore throat.   Respiratory: Positive for cough and shortness of breath.   Cardiovascular: Negative for chest pain, palpitations and leg swelling.  Gastrointestinal: Negative for abdominal pain, constipation, diarrhea, nausea and vomiting.  Musculoskeletal: Negative for myalgias.  Skin: Negative for rash.  Neurological: Negative for dizziness, light-headedness and headaches.   Past Medical History:  Diagnosis Date  . Aortic stenosis 08/25/2010   a. Bentall aortic root replacement with a St. Jude mechanical valve and Hemashield conduit 02/2004.  Marland Kitchen Arthritis   . Asthma   . AV BLOCK, COMPLETE    a. s/p St Jude dual chamber pacemaker 02/2004.  Marland Kitchen CAD (coronary artery disease)    a. s/p CABGx2 (LIMA-dLAD, SVG-Cx). b. Low risk nuc 12/2012 without ischemia, EF 46% mild apical hypokinesia (EF 55% inf HK by echo).  . Carotid artery disease ([place])    a. 0-39% bilateral ICA stenosis, stable mild hard plaque in carotid  bulbs. F/u 03/2014 recommended.  . Diabetes mellitus without complication ([place])   . DIVERTICULOSIS, COLON 10/17/2007  . Ejection fraction    a. EF 55% with inf HK, mild MR by echo 12/2012.  Marland Kitchen GERD (gastroesophageal reflux disease)    hxof   . GOUT 01/16/2007  . HEMORRHOIDS, INTERNAL 11/01/2008  . HYPERLIPIDEMIA 01/16/2007  . HYPERTENSION 01/16/2007  . Impaired glucose tolerance   . LEG EDEMA 11/27/2008  . Special screening for malignant neoplasm of prostate   . Warfarin anticoagulation    AVR     Social History   Social History  . Marital status: Married    Spouse name: N/A  . Number of children: N/A  . Years of education: N/A   Occupational History  . Not on file.   Social History Main Topics  . Smoking status: Former Smoker    Quit date: 07/12/1973  . Smokeless tobacco: Never Used  . Alcohol use No  . Drug use: No  . Sexual activity: No   Other Topics Concern  . Not on file   Social History Narrative  . No narrative on file    Past Surgical History:  Procedure Laterality Date  . AORTIC VALVE REPLACEMENT    . CARDIAC CATHETERIZATION  06/2003  . CIRCUMCISION    . COLONOSCOPY    . CORONARY ARTERY BYPASS GRAFT    . coronary stents      prior to bypass   . EP IMPLANTABLE DEVICE N/A 05/06/2016   Procedure: PPM Generator Changeout;  Surgeon: Evans Lance, MD;  Location: [place] [facility];  Service: Cardiovascular;  Laterality: N/A;  . HERNIA REPAIR    . INGUINAL HERNIA REPAIR Bilateral 02/07/2013   Procedure: LAPAROSCOPIC BILATERAL INGUINAL HERNIA REPAIR;  Surgeon: Imogene Burn. Georgette Dover, MD;  Location: [facility];  Service: General;  Laterality: Bilateral;  . INSERTION OF MESH Bilateral 02/07/2013   Procedure: INSERTION OF MESH;  Surgeon: Imogene Burn. Georgette Dover, MD;  Location: [facility];  Service: General;  Laterality: Bilateral;  . PACEMAKER INSERTION    . SHOULDER SURGERY      Family History  Problem Relation Age of Onset  . Heart attack Father   . Cancer Father     renal cancer  .  Hypertension Mother   . Diabetes Mother   . Diabetes Brother     Allergies  Allergen Reactions  . Bee Venom Shortness Of Breath and Swelling  . Codeine Phosphate Hives, Shortness Of Breath, Itching, Rash and Other (See Comments)    REACTION: difficulty breathing  . Metoprolol Tartrate Other (See Comments)    REACTION: loss of vision. Can take the XL tablet not regular   . Poison Ivy Extract     Severe rash and hives all over body  . Poison Oak Extract [Poison Oak Extract]     Severe rash and hives all over body  . Ramipril Rash and Cough    Current Outpatient Prescriptions on File Prior to Visit  Medication Sig Dispense Refill  . ACCU-CHEK FASTCLIX LANCETS MISC USE TO CHECK BLOOD SUGAR TWICE A DAY AND AS NEEDED 306 each 3  . acetaminophen (TYLENOL ARTHRITIS PAIN) 650 MG CR tablet Take 1,300 mg by mouth 2 (two) times daily.    Marland Kitchen albuterol (PROVENTIL HFA;VENTOLIN HFA) 108 (90 Base) MCG/ACT inhaler Inhale 2 puffs into the lungs every 6 (six) hours as needed for wheezing or shortness of breath.    . Ascorbic Acid (VITAMIN C) 500 MG tablet Take 1,000 mg by mouth daily.     Marland Kitchen aspirin 81 MG tablet Take 81 mg by mouth every evening.     Marland Kitchen atorvastatin (LIPITOR) 40 MG tablet Take 1 tablet (40 mg total) by mouth daily at 6 PM. 90 tablet 3  . calcium carbonate (OS-CAL) 600 MG TABS Take 600 mg by mouth 2 (two) times daily with a meal.    . cetirizine (ZYRTEC) 10 MG tablet Take 10 mg by mouth daily.     Marland Kitchen dimenhyDRINATE (DRAMAMINE) 50 MG tablet Take 25 mg by mouth daily.    . furosemide (LASIX) 40 MG tablet Take 40 mg by mouth daily.    . Glucosamine-Chondroitin 250-200 MG CAPS Take 2 capsules by mouth daily.     Marland Kitchen glucose blood (ACCU-CHEK SMARTVIEW) test strip USE TO CHECK BLOOD SUGAR TWICE A DAY AND PRN 300 each 3  . guaiFENesin-dextromethorphan (ROBITUSSIN DM) 100-10 MG/5ML syrup Take 5 mLs by mouth every 4 (four) hours as needed for cough.    . losartan (COZAAR) 100 MG tablet Take 1 tablet  (100 mg total) by mouth daily. (Patient taking differently: Take 100 mg by mouth every evening. ) 90 tablet 3  . metFORMIN (GLUCOPHAGE) 1000 MG tablet TAKE 1 TABLET (1,000 MG TOTAL) BY MOUTH 2 (TWO) TIMES DAILY WITH A MEAL. 180 tablet 3  . metoprolol succinate (TOPROL-XL) 100 MG 24 hr tablet Take 100 mg by mouth every morning. Take with or immediately following a meal.    . Misc Natural Products (TART CHERRY ADVANCED) CAPS Take 2 capsules by mouth daily.     . Multiple Vitamin (MULTIVITAMIN) tablet Take  1 tablet by mouth daily.      . nitroGLYCERIN (NITROSTAT) 0.4 MG SL tablet Place 1 tablet (0.4 mg total) under the tongue every 5 (five) minutes as needed. For chest pain 25 tablet prn  . Omega-3 Fatty Acids (FISH OIL) 1200 MG CAPS Take 1,200 mg by mouth 2 (two) times daily.    . potassium chloride (K-DUR) 10 MEQ tablet Take 10 mEq by mouth daily.     . Probiotic Product (PHILLIPS COLON HEALTH PO) Take 1 capsule by mouth daily.    . traMADol (ULTRAM) 50 MG tablet Take 50 mg by mouth every 6 (six) hours as needed for pain.     . vitamin B-12 (CYANOCOBALAMIN) 1000 MCG tablet Take 1,000 mcg by mouth daily.    Marland Kitchen warfarin (COUMADIN) 10 MG tablet TAKE AS DIRECTED BY COUMADIN CLINIC (Patient taking differently: Take 10 mg by mouth daily at 6 PM. TAKE AS DIRECTED BY COUMADIN CLINIC) 90 tablet 1   No current facility-administered medications on file prior to visit.     BP (!) 148/88 (BP Location: Left Arm, Patient Position: Sitting, Cuff Size: Normal)   Pulse 70   Temp 97.8 F (36.6 C) (Oral)   Wt 229 lb 12.8 oz (104.2 kg)   SpO2 94%   BMI 35.20 kg/m       Objective:   Physical Exam  Constitutional: He is oriented to person, place, and time. He appears well-developed and well-nourished.  HENT:  Right Ear: Tympanic membrane normal.  Left Ear: Tympanic membrane normal.  Nose: Rhinorrhea present. Right sinus exhibits no maxillary sinus tenderness and no frontal sinus tenderness. Left sinus  exhibits no maxillary sinus tenderness and no frontal sinus tenderness.  Mouth/Throat: Mucous membranes are normal. No oropharyngeal exudate or posterior oropharyngeal erythema.  Eyes: Pupils are equal, round, and reactive to light. No scleral icterus.  Neck: Neck supple.  Cardiovascular: Normal rate and regular rhythm.   Pulmonary/Chest: Effort normal. He has wheezes. He has rales.  Rales noted left lower lobe. Minimal wheezing noted.  Abdominal: Soft. Bowel sounds are normal. There is no tenderness.  Musculoskeletal: He exhibits no edema.  Lymphadenopathy:    He has no cervical adenopathy.  Neurological: He is alert and oriented to person, place, and time. Coordination normal.  Skin: Skin is warm and dry. No rash noted.       Assessment & Plan:  1. Community acquired pneumonia of left lung, unspecified part of lung Mucopurulent sputum, cough with SOB, O2 sat 94%, rales noted in left lower lobe support treatment for suspected CAP. Will obtain a chest X-ray. Advised patient and his wife to follow up in 2 to 3 days for further evaluation of symptoms. Further advised patient to follow up sooner if symptoms do not improve, worsen, or he develops a fever >100. We also discussed a recheck of his INR and he and his wife stated that they will set this appointment up. They also will contact nurse who will obtain the INR today and let her know that doxycycline has been ordered.    - DG Chest 2 View; Future - doxycycline (VIBRA-TABS) 100 MG tablet; Take 1 tablet (100 mg total) by mouth 2 (two) times daily.  Dispense: 20 tablet; Refill: 0  2. Wheezing Nebulizer treatment provided; no wheezing noted after treatment. Albuterol inhaler as needed during symptoms which has been prescribed by his PCP. Chest X-ray will determine further treatment.  - albuterol (PROVENTIL) (2.5 MG/3ML) 0.083% nebulizer solution 2.5 mg; Take 3 mLs (  2.5 mg total) by nebulization once.  Delano Metz, FNP-C

## 2016-06-07 NOTE — Patient Instructions (Addendum)
Chest X-ray has been ordered for you today. Please follow up in 2 to 3 days for evaluation and also for a check of your INR.    Community-Acquired Pneumonia, Adult Pneumonia is an infection of the lungs. There are different types of pneumonia. One type can develop while a person is in a hospital. A different type, called community-acquired pneumonia, develops in people who are not, or have not recently been, in the hospital or other health care facility. What are the causes? Pneumonia may be caused by bacteria, viruses, or funguses. Community-acquired pneumonia is often caused by Streptococcus pneumonia bacteria. These bacteria are often passed from one person to another by breathing in droplets from the cough or sneeze of an infected person. What increases the risk? The condition is more likely to develop in:  People who havechronic diseases, such as chronic obstructive pulmonary disease (COPD), asthma, congestive heart failure, cystic fibrosis, diabetes, or kidney disease.  People who haveearly-stage or late-stage HIV.  People who havesickle cell disease.  People who havehad their spleen removed (splenectomy).  People who havepoor Human resources officer.  People who havemedical conditions that increase the risk of breathing in (aspirating) secretions their own mouth and nose.  People who havea weakened immune system (immunocompromised).  People who smoke.  People whotravel to areas where pneumonia-causing germs commonly exist.  People whoare around animal habitats or animals that have pneumonia-causing germs, including birds, bats, rabbits, cats, and farm animals. What are the signs or symptoms? Symptoms of this condition include:  Adry cough.  A wet (productive) cough.  Fever.  Sweating.  Chest pain, especially when breathing deeply or coughing.  Rapid breathing or difficulty breathing.  Shortness of breath.  Shaking chills.  Fatigue.  Muscle aches. How is  this diagnosed? Your health care provider will take a medical history and perform a physical exam. You may also have other tests, including:  Imaging studies of your chest, including X-rays.  Tests to check your blood oxygen level and other blood gases.  Other tests on blood, mucus (sputum), fluid around your lungs (pleural fluid), and urine. If your pneumonia is severe, other tests may be done to identify the specific cause of your illness. How is this treated? The type of treatment that you receive depends on many factors, such as the cause of your pneumonia, the medicines you take, and other medical conditions that you have. For most adults, treatment and recovery from pneumonia may occur at home. In some cases, treatment must happen in a hospital. Treatment may include:  Antibiotic medicines, if the pneumonia was caused by bacteria.  Antiviral medicines, if the pneumonia was caused by a virus.  Medicines that are given by mouth or through an IV tube.  Oxygen.  Respiratory therapy. Although rare, treating severe pneumonia may include:  Mechanical ventilation. This is done if you are not breathing well on your own and you cannot maintain a safe blood oxygen level.  Thoracentesis. This procedureremoves fluid around one lung or both lungs to help you breathe better. Follow these instructions at home:  Take over-the-counter and prescription medicines only as told by your health care provider.  Only takecough medicine if you are losing sleep. Understand that cough medicine can prevent your body's natural ability to remove mucus from your lungs.  If you were prescribed an antibiotic medicine, take it as told by your health care provider. Do not stop taking the antibiotic even if you start to feel better.  Sleep in a semi-upright position at  night. Try sleeping in a reclining chair, or place a few pillows under your head.  Do not use tobacco products, including cigarettes, chewing  tobacco, and e-cigarettes. If you need help quitting, ask your health care provider.  Drink enough water to keep your urine clear or pale yellow. This will help to thin out mucus secretions in your lungs. How is this prevented? There are ways that you can decrease your risk of developing community-acquired pneumonia. Consider getting a pneumococcal vaccine if:  You are older than 72 years of age.  You are older than 72 years of age and are undergoing cancer treatment, have chronic lung disease, or have other medical conditions that affect your immune system. Ask your health care provider if this applies to you. There are different types and schedules of pneumococcal vaccines. Ask your health care provider which vaccination option is best for you. You may also prevent community-acquired pneumonia if you take these actions:  Get an influenza vaccine every year. Ask your health care provider which type of influenza vaccine is best for you.  Go to the dentist on a regular basis.  Wash your hands often. Use hand sanitizer if soap and water are not available. Contact a health care provider if:  You have a fever.  You are losing sleep because you cannot control your cough with cough medicine. Get help right away if:  You have worsening shortness of breath.  You have increased chest pain.  Your sickness becomes worse, especially if you are an older adult or have a weakened immune system.  You cough up blood. This information is not intended to replace advice given to you by your health care provider. Make sure you discuss any questions you have with your health care provider. Document Released: 06/28/2005 Document Revised: 11/06/2015 Document Reviewed: 10/23/2014 Elsevier Interactive Patient Education  2017 [employer].

## 2016-06-10 ENCOUNTER — Ambulatory Visit (INDEPENDENT_AMBULATORY_CARE_PROVIDER_SITE_OTHER): Payer: Commercial Managed Care - HMO | Admitting: Family Medicine

## 2016-06-10 ENCOUNTER — Encounter: Payer: Self-pay | Admitting: Family Medicine

## 2016-06-10 ENCOUNTER — Other Ambulatory Visit: Payer: Self-pay | Admitting: *Deleted

## 2016-06-10 VITALS — BP 140/78 | HR 66 | Temp 98.0°F | Wt 226.2 lb

## 2016-06-10 DIAGNOSIS — R059 Cough, unspecified: Secondary | ICD-10-CM

## 2016-06-10 DIAGNOSIS — J189 Pneumonia, unspecified organism: Secondary | ICD-10-CM

## 2016-06-10 DIAGNOSIS — R05 Cough: Secondary | ICD-10-CM

## 2016-06-10 MED ORDER — BENZONATATE 100 MG PO CAPS: 100.0000 mg | ORAL_CAPSULE | Freq: Three times a day (TID) | ORAL | 0 refills | Status: DC

## 2016-06-10 MED ORDER — ALBUTEROL SULFATE HFA 108 (90 BASE) MCG/ACT IN AERS: 2.0000 | INHALATION_SPRAY | Freq: Four times a day (QID) | RESPIRATORY_TRACT | 3 refills | Status: DC | PRN

## 2016-06-10 NOTE — Progress Notes (Signed)
Pre visit review using our clinic review tool, if applicable. No additional management support is needed unless otherwise documented below in the visit note. 

## 2016-06-10 NOTE — Patient Instructions (Signed)
It was a pleasure to see you today! If symptoms do not continue to improve, worsen, or you develop new symptoms, please follow up for further evaluation.   Community-Acquired Pneumonia, Adult Introduction Pneumonia is an infection of the lungs. One type of pneumonia can happen while a person is in a hospital. A different type can happen when a person is not in a hospital (community-acquired pneumonia). It is easy for this kind to spread from person to person. It can spread to you if you breathe near an infected person who coughs or sneezes. Some symptoms include:  A dry cough.  A wet (productive) cough.  Fever.  Sweating.  Chest pain. Follow these instructions at home:  Take over-the-counter and prescription medicines only as told by your doctor.  Only take cough medicine if you are losing sleep.  If you were prescribed an antibiotic medicine, take it as told by your doctor. Do not stop taking the antibiotic even if you start to feel better.  Sleep with your head and neck raised (elevated). You can do this by putting a few pillows under your head, or you can sleep in a recliner.  Do not use tobacco products. These include cigarettes, chewing tobacco, and e-cigarettes. If you need help quitting, ask your doctor.  Drink enough water to keep your pee (urine) clear or pale yellow. A shot (vaccine) can help prevent pneumonia. Shots are often suggested for:  People older than 72 years of age.  People older than 72 years of age:  Who are having cancer treatment.  Who have long-term (chronic) lung disease.  Who have problems with their body's defense system (immune system). You may also prevent pneumonia if you take these actions:  Get the flu (influenza) shot every year.  Go to the dentist as often as told.  Wash your hands often. If soap and water are not available, use hand sanitizer. Contact a doctor if:  You have a fever.  You lose sleep because your cough medicine  does not help. Get help right away if:  You are short of breath and it gets worse.  You have more chest pain.  Your sickness gets worse. This is very serious if:  You are an older adult.  Your body's defense system is weak.  You cough up blood. This information is not intended to replace advice given to you by your health care provider. Make sure you discuss any questions you have with your health care provider. Document Released: 12/15/2007 Document Revised: 12/04/2015 Document Reviewed: 10/23/2014  2017 Elsevier

## 2016-06-10 NOTE — Progress Notes (Signed)
Subjective:    Patient ID: [name], male    DOB: 1946-04-06, 70 y.o.   MRN: [id number]  HPI  [name] is a 70 year old male who presents today for follow up for CAP.  He reports a productive cough with yellow sputum and rhinits with yellow drainage that remains present after 2 weeks.  He denies history of asthma/bronchitis.  History of CAD, pacemaker, aortic valve disease with mechanical aortic valve replacement in 2005. He denies fever, chills, sweats, ear pain, N/V/D. Treatment with Mucinex and doxycycline are providing benefit for patient.  No aggravating or alleviating factors are noted. Chest X-ray on 06/07/16 did not reveal acute cardiopulmonary disease.  Review of Systems  Constitutional: Negative for chills and fever.  HENT: Positive for rhinorrhea. Negative for congestion, sinus pressure, sneezing and sore throat.   Respiratory: Positive for cough. Negative for shortness of breath and wheezing.   Cardiovascular: Negative for chest pain and palpitations.  Gastrointestinal: Negative for abdominal pain, diarrhea, nausea and vomiting.  Genitourinary: Negative for dysuria and frequency.  Musculoskeletal: Negative for back pain.  Neurological: Negative for dizziness, light-headedness, numbness and headaches.   Past Medical History:  Diagnosis Date  . Aortic stenosis 08/25/2010   a. Bentall aortic root replacement with a St. Jude mechanical valve and Hemashield conduit 02/2004.  Marland Kitchen Arthritis   . Asthma   . AV BLOCK, COMPLETE    a. s/p St Jude dual chamber pacemaker 02/2004.  Marland Kitchen CAD (coronary artery disease)    a. s/p CABGx2 (LIMA-dLAD, SVG-Cx). b. Low risk nuc 12/2012 without ischemia, EF 46% mild apical hypokinesia (EF 55% inf HK by echo).  . Carotid artery disease ([place])    a. 0-39% bilateral ICA stenosis, stable mild hard plaque in carotid bulbs. F/u 03/2014 recommended.  . Diabetes mellitus without complication ([place])   . DIVERTICULOSIS, COLON 10/17/2007  . Ejection  fraction    a. EF 55% with inf HK, mild MR by echo 12/2012.  Marland Kitchen GERD (gastroesophageal reflux disease)    hxof   . GOUT 01/16/2007  . HEMORRHOIDS, INTERNAL 11/01/2008  . HYPERLIPIDEMIA 01/16/2007  . HYPERTENSION 01/16/2007  . Impaired glucose tolerance   . LEG EDEMA 11/27/2008  . Special screening for malignant neoplasm of prostate   . Warfarin anticoagulation    AVR     Social History   Social History  . Marital status: Married    Spouse name: N/A  . Number of children: N/A  . Years of education: N/A   Occupational History  . Not on file.   Social History Main Topics  . Smoking status: Former Smoker    Quit date: 07/12/1973  . Smokeless tobacco: Never Used  . Alcohol use No  . Drug use: No  . Sexual activity: No   Other Topics Concern  . Not on file   Social History Narrative  . No narrative on file    Past Surgical History:  Procedure Laterality Date  . AORTIC VALVE REPLACEMENT    . CARDIAC CATHETERIZATION  06/2003  . CIRCUMCISION    . COLONOSCOPY    . CORONARY ARTERY BYPASS GRAFT    . coronary stents      prior to bypass   . EP IMPLANTABLE DEVICE N/A 05/06/2016   Procedure: PPM Generator Changeout;  Surgeon: Evans Lance, MD;  Location: [facility];  Service: Cardiovascular;  Laterality: N/A;  . HERNIA REPAIR    . INGUINAL HERNIA REPAIR Bilateral 02/07/2013   Procedure: LAPAROSCOPIC  BILATERAL INGUINAL HERNIA REPAIR;  Surgeon: Imogene Burn. Georgette Dover, MD;  Location: [facility];  Service: General;  Laterality: Bilateral;  . INSERTION OF MESH Bilateral 02/07/2013   Procedure: INSERTION OF MESH;  Surgeon: Imogene Burn. Georgette Dover, MD;  Location: [facility];  Service: General;  Laterality: Bilateral;  . PACEMAKER INSERTION    . SHOULDER SURGERY      Family History  Problem Relation Age of Onset  . Heart attack Father   . Cancer Father     renal cancer  . Hypertension Mother   . Diabetes Mother   . Diabetes Brother     Allergies  Allergen Reactions  . Bee Venom Shortness Of  Breath and Swelling  . Codeine Phosphate Hives, Shortness Of Breath, Itching, Rash and Other (See Comments)    REACTION: difficulty breathing  . Metoprolol Tartrate Other (See Comments)    REACTION: loss of vision. Can take the XL tablet not regular   . Poison Ivy Extract     Severe rash and hives all over body  . Poison Oak Extract [Poison Oak Extract]     Severe rash and hives all over body  . Ramipril Rash and Cough    Current Outpatient Prescriptions on File Prior to Visit  Medication Sig Dispense Refill  . ACCU-CHEK FASTCLIX LANCETS MISC USE TO CHECK BLOOD SUGAR TWICE A Whitney AND AS NEEDED 306 each 3  . acetaminophen (TYLENOL ARTHRITIS PAIN) 650 MG CR tablet Take 1,300 mg by mouth 2 (two) times daily.    Marland Kitchen albuterol (PROVENTIL HFA;VENTOLIN HFA) 108 (90 Base) MCG/ACT inhaler Inhale 2 puffs into the lungs every 6 (six) hours as needed for wheezing or shortness of breath.    . Ascorbic Acid (VITAMIN C) 500 MG tablet Take 1,000 mg by mouth daily.     Marland Kitchen aspirin 81 MG tablet Take 81 mg by mouth every evening.     Marland Kitchen atorvastatin (LIPITOR) 40 MG tablet Take 1 tablet (40 mg total) by mouth daily at 6 PM. 90 tablet 3  . calcium carbonate (OS-CAL) 600 MG TABS Take 600 mg by mouth 2 (two) times daily with a meal.    . cetirizine (ZYRTEC) 10 MG tablet Take 10 mg by mouth daily.     Marland Kitchen dimenhyDRINATE (DRAMAMINE) 50 MG tablet Take 25 mg by mouth daily.    Marland Kitchen doxycycline (VIBRA-TABS) 100 MG tablet Take 1 tablet (100 mg total) by mouth 2 (two) times daily. 20 tablet 0  . furosemide (LASIX) 40 MG tablet Take 40 mg by mouth daily.    . Glucosamine-Chondroitin 250-200 MG CAPS Take 2 capsules by mouth daily.     Marland Kitchen glucose blood (ACCU-CHEK SMARTVIEW) test strip USE TO CHECK BLOOD SUGAR TWICE A Whitney AND PRN 300 each 3  . guaiFENesin-dextromethorphan (ROBITUSSIN DM) 100-10 MG/5ML syrup Take 5 mLs by mouth every 4 (four) hours as needed for cough.    . losartan (COZAAR) 100 MG tablet Take 1 tablet (100 mg total)  by mouth daily. (Patient taking differently: Take 100 mg by mouth every evening. ) 90 tablet 3  . metFORMIN (GLUCOPHAGE) 1000 MG tablet TAKE 1 TABLET (1,000 MG TOTAL) BY MOUTH 2 (TWO) TIMES DAILY WITH A MEAL. 180 tablet 3  . metoprolol succinate (TOPROL-XL) 100 MG 24 hr tablet Take 100 mg by mouth every morning. Take with or immediately following a meal.    . Misc Natural Products (TART CHERRY ADVANCED) CAPS Take 2 capsules by mouth daily.     . Multiple Vitamin (  MULTIVITAMIN) tablet Take 1 tablet by mouth daily.      . nitroGLYCERIN (NITROSTAT) 0.4 MG SL tablet Place 1 tablet (0.4 mg total) under the tongue every 5 (five) minutes as needed. For chest pain 25 tablet prn  . Omega-3 Fatty Acids (FISH OIL) 1200 MG CAPS Take 1,200 mg by mouth 2 (two) times daily.    . potassium chloride (K-DUR) 10 MEQ tablet Take 10 mEq by mouth daily.     . Probiotic Product (PHILLIPS COLON HEALTH PO) Take 1 capsule by mouth daily.    . traMADol (ULTRAM) 50 MG tablet Take 50 mg by mouth every 6 (six) hours as needed for pain.     . vitamin B-12 (CYANOCOBALAMIN) 1000 MCG tablet Take 1,000 mcg by mouth daily.    Marland Kitchen warfarin (COUMADIN) 10 MG tablet TAKE AS DIRECTED BY COUMADIN CLINIC (Patient taking differently: Take 10 mg by mouth daily at 6 PM. TAKE AS DIRECTED BY COUMADIN CLINIC) 90 tablet 1   No current facility-administered medications on file prior to visit.     BP 140/78 (BP Location: Left Arm, Patient Position: Sitting, Cuff Size: Large)   Pulse 66   Temp 98 F (36.7 C) (Oral)   Wt 226 lb 3.2 oz (102.6 kg)   SpO2 94%   BMI 34.65 kg/m       Objective:   Physical Exam  Constitutional: He is oriented to person, place, and time. He appears well-developed and well-nourished.  HENT:  Right Ear: Tympanic membrane normal.  Left Ear: Tympanic membrane normal.  Nose: Rhinorrhea present. Right sinus exhibits no maxillary sinus tenderness and no frontal sinus tenderness. Left sinus exhibits no maxillary sinus  tenderness and no frontal sinus tenderness.  Mouth/Throat: Mucous membranes are normal. No oropharyngeal exudate or posterior oropharyngeal erythema.  Eyes: Pupils are equal, round, and reactive to light. No scleral icterus.  Neck: Neck supple.  Cardiovascular: Normal rate and regular rhythm.   Pulmonary/Chest: Effort normal and breath sounds normal. He has no wheezes. He has no rales.  Abdominal: Soft. There is no tenderness.  Lymphadenopathy:    He has no cervical adenopathy.  Neurological: He is alert and oriented to person, place, and time. Coordination normal.  Skin: Skin is warm and dry. No rash noted.       Assessment & Plan:  1. Community acquired pneumonia, unspecified laterality Symptoms are improving with doxycycline; Cough remains present, lungs CTA today. Advised patient to continue doxycycline as ordered and follow up if symptoms do not continue to improve, worsens, or he develops SOB. He will obtain INR tomorrow. Refill of albuterol has been provided. Advised patient that this should be used on a short term basis and follow up with his PCP if he is using this > 2x/week after resolution of symptoms.  2. Cough Advised increased water and Mucinex DM or benzonatate for cough not controlled with Mucinex DM. - benzonatate (TESSALON) 100 MG capsule; Take 1 capsule (100 mg total) by mouth 3 (three) times daily.  Dispense: 20 capsule; Refill: 0  Delano Metz, FNP-C

## 2016-06-11 ENCOUNTER — Ambulatory Visit (INDEPENDENT_AMBULATORY_CARE_PROVIDER_SITE_OTHER): Payer: Commercial Managed Care - HMO

## 2016-06-11 DIAGNOSIS — Z5181 Encounter for therapeutic drug level monitoring: Secondary | ICD-10-CM

## 2016-06-11 DIAGNOSIS — I359 Nonrheumatic aortic valve disorder, unspecified: Secondary | ICD-10-CM

## 2016-06-11 DIAGNOSIS — Z952 Presence of prosthetic heart valve: Secondary | ICD-10-CM | POA: Diagnosis not present

## 2016-06-11 DIAGNOSIS — Z7901 Long term (current) use of anticoagulants: Secondary | ICD-10-CM

## 2016-06-11 LAB — POCT INR: INR: 2.2

## 2016-06-17 ENCOUNTER — Telehealth: Payer: Self-pay | Admitting: Internal Medicine

## 2016-06-17 ENCOUNTER — Ambulatory Visit (INDEPENDENT_AMBULATORY_CARE_PROVIDER_SITE_OTHER): Payer: Commercial Managed Care - HMO | Admitting: Family Medicine

## 2016-06-17 ENCOUNTER — Encounter: Payer: Self-pay | Admitting: Family Medicine

## 2016-06-17 VITALS — BP 122/78 | HR 83 | Temp 98.1°F | Ht 67.75 in | Wt 225.3 lb

## 2016-06-17 DIAGNOSIS — J4541 Moderate persistent asthma with (acute) exacerbation: Secondary | ICD-10-CM | POA: Diagnosis not present

## 2016-06-17 DIAGNOSIS — J988 Other specified respiratory disorders: Secondary | ICD-10-CM

## 2016-06-17 MED ORDER — PREDNISONE 10 MG PO TABS: ORAL_TABLET | ORAL | 0 refills | Status: DC

## 2016-06-17 MED ORDER — BENZONATATE 100 MG PO CAPS: 100.0000 mg | ORAL_CAPSULE | Freq: Three times a day (TID) | ORAL | 0 refills | Status: DC | PRN

## 2016-06-17 MED ORDER — AMOXICILLIN-POT CLAVULANATE 875-125 MG PO TABS: 1.0000 | ORAL_TABLET | Freq: Two times a day (BID) | ORAL | 0 refills | Status: DC

## 2016-06-17 NOTE — Telephone Encounter (Signed)
Spoke with pt's wife and scheduled appt for this afternoon with Almyra Free. Nothing further needed at this time.

## 2016-06-17 NOTE — Progress Notes (Signed)
HPI:  [name] Is a pleasant 70 year old with a past medical history significant for asthma here for an acute visit for cough and wheezing. He saw another provider and was treated with doxycycline for possible pneumonia. His chest x-ray looked good. He also has been using Tessalon which he thinks helps the cough. He reports his symptoms started 3 weeks ago when he was raking leaves. He reports he has a lot of allergies, particularly to poinsettias which he has been around a lot.he has had a runny nose, postnasal drip, itchy nose, wheezing and a productive cough. He reports that in the mucus turned clear on the antibiotic, but now has become green again. His albuterol helps with his symptoms. He has required prednisone in the past for similar symptoms and wonders if he needs a course of this. His wife reports he can take prednisone, but has to call his Coumadin clinic when he is on this for an adjustment in his dose. No fevers, body aches, rash, NVD, CP, increased swelling or palpitations.  ROS: See pertinent positives and negatives per HPI.  Past Medical History:  Diagnosis Date  . Aortic stenosis 08/25/2010   a. Bentall aortic root replacement with a St. Jude mechanical valve and Hemashield conduit 02/2004.  Marland Kitchen Arthritis   . Asthma   . AV BLOCK, COMPLETE    a. s/p St Jude dual chamber pacemaker 02/2004.  Marland Kitchen CAD (coronary artery disease)    a. s/p CABGx2 (LIMA-dLAD, SVG-Cx). b. Low risk nuc 12/2012 without ischemia, EF 46% mild apical hypokinesia (EF 55% inf HK by echo).  . Carotid artery disease ([place])    a. 0-39% bilateral ICA stenosis, stable mild hard plaque in carotid bulbs. F/u 03/2014 recommended.  . Diabetes mellitus without complication ([place])   . DIVERTICULOSIS, COLON 10/17/2007  . Ejection fraction    a. EF 55% with inf HK, mild MR by echo 12/2012.  Marland Kitchen GERD (gastroesophageal reflux disease)    hxof   . GOUT 01/16/2007  . HEMORRHOIDS, INTERNAL 11/01/2008  . HYPERLIPIDEMIA 01/16/2007  .  HYPERTENSION 01/16/2007  . Impaired glucose tolerance   . LEG EDEMA 11/27/2008  . Special screening for malignant neoplasm of prostate   . Warfarin anticoagulation    AVR    Past Surgical History:  Procedure Laterality Date  . AORTIC VALVE REPLACEMENT    . CARDIAC CATHETERIZATION  06/2003  . CIRCUMCISION    . COLONOSCOPY    . CORONARY ARTERY BYPASS GRAFT    . coronary stents      prior to bypass   . EP IMPLANTABLE DEVICE N/A 05/06/2016   Procedure: PPM Generator Changeout;  Surgeon: Evans Lance, MD;  Location: [facility];  Service: Cardiovascular;  Laterality: N/A;  . HERNIA REPAIR    . INGUINAL HERNIA REPAIR Bilateral 02/07/2013   Procedure: LAPAROSCOPIC BILATERAL INGUINAL HERNIA REPAIR;  Surgeon: Imogene Burn. Georgette Dover, MD;  Location: [facility];  Service: General;  Laterality: Bilateral;  . INSERTION OF MESH Bilateral 02/07/2013   Procedure: INSERTION OF MESH;  Surgeon: Imogene Burn. Georgette Dover, MD;  Location: [facility];  Service: General;  Laterality: Bilateral;  . PACEMAKER INSERTION    . SHOULDER SURGERY      Family History  Problem Relation Age of Onset  . Heart attack Father   . Cancer Father     renal cancer  . Hypertension Mother   . Diabetes Mother   . Diabetes Brother     Social History   Social History  .  Marital status: Married    Spouse name: N/A  . Number of children: N/A  . Years of education: N/A   Social History Main Topics  . Smoking status: Former Smoker    Quit date: 07/12/1973  . Smokeless tobacco: Never Used  . Alcohol use No  . Drug use: No  . Sexual activity: No   Other Topics Concern  . None   Social History Narrative  . None     Current Outpatient Prescriptions:  .  ACCU-CHEK FASTCLIX LANCETS MISC, USE TO CHECK BLOOD SUGAR TWICE A DAY AND AS NEEDED, Disp: 306 each, Rfl: 3 .  acetaminophen (TYLENOL ARTHRITIS PAIN) 650 MG CR tablet, Take 1,300 mg by mouth 2 (two) times daily., Disp: , Rfl:  .  albuterol (PROVENTIL HFA;VENTOLIN HFA) 108 (90  Base) MCG/ACT inhaler, Inhale 2 puffs into the lungs every 6 (six) hours as needed for wheezing or shortness of breath., Disp: 3 Inhaler, Rfl: 3 .  Ascorbic Acid (VITAMIN C) 500 MG tablet, Take 1,000 mg by mouth daily. , Disp: , Rfl:  .  aspirin 81 MG tablet, Take 81 mg by mouth every evening. , Disp: , Rfl:  .  atorvastatin (LIPITOR) 40 MG tablet, Take 1 tablet (40 mg total) by mouth daily at 6 PM., Disp: 90 tablet, Rfl: 3 .  calcium carbonate (OS-CAL) 600 MG TABS, Take 600 mg by mouth 2 (two) times daily with a meal., Disp: , Rfl:  .  cetirizine (ZYRTEC) 10 MG tablet, Take 10 mg by mouth daily. , Disp: , Rfl:  .  dimenhyDRINATE (DRAMAMINE) 50 MG tablet, Take 25 mg by mouth daily., Disp: , Rfl:  .  doxycycline (VIBRA-TABS) 100 MG tablet, Take 1 tablet (100 mg total) by mouth 2 (two) times daily., Disp: 20 tablet, Rfl: 0 .  furosemide (LASIX) 40 MG tablet, Take 40 mg by mouth daily., Disp: , Rfl:  .  Glucosamine-Chondroitin 250-200 MG CAPS, Take 2 capsules by mouth daily. , Disp: , Rfl:  .  glucose blood (ACCU-CHEK SMARTVIEW) test strip, USE TO CHECK BLOOD SUGAR TWICE A DAY AND PRN, Disp: 300 each, Rfl: 3 .  guaiFENesin-dextromethorphan (ROBITUSSIN DM) 100-10 MG/5ML syrup, Take 5 mLs by mouth every 4 (four) hours as needed for cough., Disp: , Rfl:  .  losartan (COZAAR) 100 MG tablet, Take 1 tablet (100 mg total) by mouth daily. (Patient taking differently: Take 100 mg by mouth every evening. ), Disp: 90 tablet, Rfl: 3 .  metFORMIN (GLUCOPHAGE) 1000 MG tablet, TAKE 1 TABLET (1,000 MG TOTAL) BY MOUTH 2 (TWO) TIMES DAILY WITH A MEAL., Disp: 180 tablet, Rfl: 3 .  metoprolol succinate (TOPROL-XL) 100 MG 24 hr tablet, Take 100 mg by mouth every morning. Take with or immediately following a meal., Disp: , Rfl:  .  Misc Natural Products (TART CHERRY ADVANCED) CAPS, Take 2 capsules by mouth daily. , Disp: , Rfl:  .  Multiple Vitamin (MULTIVITAMIN) tablet, Take 1 tablet by mouth daily.  , Disp: , Rfl:  .   nitroGLYCERIN (NITROSTAT) 0.4 MG SL tablet, Place 1 tablet (0.4 mg total) under the tongue every 5 (five) minutes as needed. For chest pain, Disp: 25 tablet, Rfl: prn .  Omega-3 Fatty Acids (FISH OIL) 1200 MG CAPS, Take 1,200 mg by mouth 2 (two) times daily., Disp: , Rfl:  .  potassium chloride (K-DUR) 10 MEQ tablet, Take 10 mEq by mouth daily. , Disp: , Rfl:  .  Probiotic Product ([place]), Take 1  capsule by mouth daily., Disp: , Rfl:  .  traMADol (ULTRAM) 50 MG tablet, Take 50 mg by mouth every 6 (six) hours as needed for pain. , Disp: , Rfl:  .  vitamin B-12 (CYANOCOBALAMIN) 1000 MCG tablet, Take 1,000 mcg by mouth daily., Disp: , Rfl:  .  warfarin (COUMADIN) 10 MG tablet, TAKE AS DIRECTED BY COUMADIN CLINIC (Patient taking differently: Take 10 mg by mouth daily at 6 PM. TAKE AS DIRECTED BY COUMADIN CLINIC), Disp: 90 tablet, Rfl: 1 .  amoxicillin-clavulanate (AUGMENTIN) 875-125 MG tablet, Take 1 tablet by mouth 2 (two) times daily., Disp: 14 tablet, Rfl: 0 .  benzonatate (TESSALON) 100 MG capsule, Take 1 capsule (100 mg total) by mouth 3 (three) times daily as needed for cough., Disp: 20 capsule, Rfl: 0 .  predniSONE (DELTASONE) 10 MG tablet, 40mg  (4 tabs) x 2 days, then 20mg  (2 tabs) x 2 days, then 10mg  (1 tab) x 2 days, Disp: 14 tablet, Rfl: 0  EXAM:  Vitals:   06/17/16 1559  BP: 122/78  Pulse: 83  Temp: 98.1 F (36.7 C)    Body mass index is 34.51 kg/m.  GENERAL: vitals reviewed and listed above, alert, oriented, appears well hydrated and in no acute distress  HEENT: atraumatic, conjunttiva clear, no obvious abnormalities on inspection of external nose and ears, normal appearance of ear canals and TMs, clear nasal congestion, mild post oropharyngeal erythema with PND, no tonsillar edema or exudate, no sinus TTP  NECK: no obvious masses on inspection  LUNGS: defuse exp wheeze, no signs resp distress  CV: HRRR, no peripheral edema  MS: moves all extremities  without noticeable abnormality  PSYCH: pleasant and cooperative, no obvious depression or anxiety  ASSESSMENT AND PLAN:  Discussed the following assessment and plan:  Respiratory infection - Plan: amoxicillin-clavulanate (AUGMENTIN) 875-125 MG tablet  Moderate persistent asthma with acute exacerbation - Plan: predniSONE (DELTASONE) 10 MG tablet  -prednisone, augmentin for asthma with acute exacerbation w/ discolored increased mucus production -alb prn -refilled tessalong -Patient advised to return or notify a doctor immediately if symptoms worsen or persist or new concerns arise.  Patient Instructions  BEFORE YOU LEAVE: -follow up: in 1 week if not significantly better  Take the antibiotic (augmentin) and prednisone as instructed.  Use the tessalon and your albuterol as needed.  Follow up sooner if worsening or new concerns.    Colin Benton R., DO

## 2016-06-17 NOTE — Patient Instructions (Signed)
BEFORE YOU LEAVE: -follow up: in 1 week if not significantly better  Take the antibiotic (augmentin) and prednisone as instructed.  Use the tessalon and your albuterol as needed.  Follow up sooner if worsening or new concerns.

## 2016-06-17 NOTE — Telephone Encounter (Signed)
Patient Name: [name]  DOB: 01-08-1946    Initial Comment Caller states husband just finished abx for pneumonia, still coughing and having congestion   Nurse Assessment  Nurse: Raphael Gibney, RN, Vanita Ingles Date/Time (Eastern Time): 06/17/2016 12:58:03 PM  Confirm and document reason for call. If symptomatic, describe symptoms. ---Caller states spouse finished antibiotics for pneumonia this am. he is still coughing and is still congested. No fever. Change in sputum has changed from yellow to clear. he is feeling some better. He is taking Tessalon and it is not helping much.  Does the patient have any new or worsening symptoms? ---Yes  Will a triage be completed? ---Yes  Related visit to physician within the last 2 weeks? ---Yes  Does the PT have any chronic conditions? (i.e. diabetes, asthma, etc.) ---Yes  List chronic conditions. ---heart surgery; pacemaker; diabetes; asthma  Is this a behavioral health or substance abuse call? ---No     Guidelines    Guideline Title Affirmed Question Affirmed Notes  Cough - Acute Productive SEVERE coughing spells (e.g., whooping sound after coughing, vomiting after coughing)    Final Disposition User   See Physician within 24 Hours [place], RN, Vanita Ingles    Comments  pt would prefer to see Almira Coaster or Dr. Bluford Kaufmann and no appts available within 24 hrs. Please call pt back regarding medication for cough or appt.   Referrals  GO TO FACILITY REFUSED   Disagree/Comply: Disagree  Disagree/Comply Reason: Disagree with instructions

## 2016-06-17 NOTE — Progress Notes (Signed)
Pre visit review using our clinic review tool, if applicable. No additional management support is needed unless otherwise documented below in the visit note. 

## 2016-06-18 ENCOUNTER — Telehealth: Payer: Self-pay

## 2016-06-18 NOTE — Telephone Encounter (Signed)
[name] wife called stated pt has been started on Augmentin 2 tabs BID x 1 week starting today 06/18/16, and Prednisone 10mg  tablets, 14 tablets, 6 day course (4 tabs QD x 2 days, 2 tabs QD x 2 days, 1 tab QD x 2 days) starting today 06/18/16 as well.  Made appt to check INR on 06/22/16, aware Prednisone can interact with Coumadin, will monitor for any incr bleeding/bruising.

## 2016-06-22 ENCOUNTER — Ambulatory Visit (INDEPENDENT_AMBULATORY_CARE_PROVIDER_SITE_OTHER): Payer: Commercial Managed Care - HMO | Admitting: *Deleted

## 2016-06-22 DIAGNOSIS — Z5181 Encounter for therapeutic drug level monitoring: Secondary | ICD-10-CM

## 2016-06-22 DIAGNOSIS — Z7901 Long term (current) use of anticoagulants: Secondary | ICD-10-CM

## 2016-06-22 DIAGNOSIS — I359 Nonrheumatic aortic valve disorder, unspecified: Secondary | ICD-10-CM

## 2016-06-22 DIAGNOSIS — Z952 Presence of prosthetic heart valve: Secondary | ICD-10-CM | POA: Diagnosis not present

## 2016-06-22 LAB — POCT INR: INR: 2.8

## 2016-06-25 LAB — HM DIABETES EYE EXAM

## 2016-07-07 ENCOUNTER — Encounter: Payer: Self-pay | Admitting: Internal Medicine

## 2016-07-22 ENCOUNTER — Ambulatory Visit (INDEPENDENT_AMBULATORY_CARE_PROVIDER_SITE_OTHER): Payer: Medicare HMO | Admitting: *Deleted

## 2016-07-22 DIAGNOSIS — Z5181 Encounter for therapeutic drug level monitoring: Secondary | ICD-10-CM | POA: Diagnosis not present

## 2016-07-22 DIAGNOSIS — I359 Nonrheumatic aortic valve disorder, unspecified: Secondary | ICD-10-CM

## 2016-07-22 DIAGNOSIS — Z952 Presence of prosthetic heart valve: Secondary | ICD-10-CM | POA: Diagnosis not present

## 2016-07-22 DIAGNOSIS — Z7901 Long term (current) use of anticoagulants: Secondary | ICD-10-CM | POA: Diagnosis not present

## 2016-07-22 LAB — POCT INR: INR: 2.8

## 2016-08-10 ENCOUNTER — Encounter: Payer: Commercial Managed Care - HMO | Admitting: Internal Medicine

## 2016-08-13 ENCOUNTER — Encounter: Payer: Self-pay | Admitting: Internal Medicine

## 2016-08-13 ENCOUNTER — Ambulatory Visit (INDEPENDENT_AMBULATORY_CARE_PROVIDER_SITE_OTHER): Payer: Medicare HMO | Admitting: Internal Medicine

## 2016-08-13 VITALS — BP 142/84 | HR 69 | Ht 69.0 in | Wt 232.2 lb

## 2016-08-13 DIAGNOSIS — Z950 Presence of cardiac pacemaker: Secondary | ICD-10-CM

## 2016-08-13 DIAGNOSIS — I442 Atrioventricular block, complete: Secondary | ICD-10-CM | POA: Diagnosis not present

## 2016-08-13 LAB — CUP PACEART INCLINIC DEVICE CHECK
Brady Statistic RA Percent Paced: 26 %
Date Time Interrogation Session: 20180202152440
Implantable Lead Implant Date: 20050816
Implantable Lead Implant Date: 20050816
Implantable Lead Location: 753860
Lead Channel Impedance Value: 725 Ohm
Lead Channel Pacing Threshold Pulse Width: 0.5 ms
Lead Channel Pacing Threshold Pulse Width: 0.5 ms
Lead Channel Setting Pacing Amplitude: 0.875
Lead Channel Setting Pacing Amplitude: 1.875
Lead Channel Setting Sensing Sensitivity: 4 mV
MDC IDC LEAD LOCATION: 753859
MDC IDC MSMT BATTERY VOLTAGE: 3.02 V
MDC IDC MSMT LEADCHNL RA IMPEDANCE VALUE: 487.5 Ohm
MDC IDC MSMT LEADCHNL RA PACING THRESHOLD AMPLITUDE: 0.875 V
MDC IDC MSMT LEADCHNL RA SENSING INTR AMPL: 2.8 mV
MDC IDC MSMT LEADCHNL RV PACING THRESHOLD AMPLITUDE: 0.625 V
MDC IDC PG IMPLANT DT: 20171026
MDC IDC PG SERIAL: 3182792
MDC IDC SET LEADCHNL RV PACING PULSEWIDTH: 0.5 ms
MDC IDC STAT BRADY RV PERCENT PACED: 99.9 %
Pulse Gen Model: 2272

## 2016-08-13 NOTE — Patient Instructions (Addendum)
Medication Instructions:  Your physician recommends that you continue on your current medications as directed. Please refer to the Current Medication list given to you today.   Labwork: None Ordered   Testing/Procedures: None Ordered   Follow-Up: Your physician wants you to follow-up in: 9 months with Dr. Lovena Le. You will receive a reminder letter in the mail two months in advance. If you don't receive a letter, please call our office to schedule the follow-up appointment.  Remote monitoring is used to monitor your Pacemaker from home. This monitoring reduces the number of office visits required to check your device to one time per year. It allows [place] to keep an eye on the functioning of your device to ensure it is working properly. You are scheduled for a device check from home on 11/15/16. You may send your transmission at any time that day. If you have a wireless device, the transmission will be sent automatically. After your physician reviews your transmission, you will receive a postcard with your next transmission date.    Any Other Special Instructions Will Be Listed Below (If Applicable).     If you need a refill on your cardiac medications before your next appointment, please call your pharmacy.

## 2016-08-13 NOTE — Progress Notes (Signed)
HPI [name] returns today for followup. He is a very pleasant 71 year old man with a history of symptomatic bradycardia due to complete heart block, status post permanent pacemaker insertion. He is s/p Bentall with CHB. He has done well in the interim.  His goal is to get down to 200 pounds. He denies chest pain, shortness of breath, or peripheral edema. No cough or hemoptysis. He has undergone PPM gen change and healed nicely.  Allergies  Allergen Reactions  . Bee Venom Shortness Of Breath and Swelling  . Codeine Phosphate Hives, Shortness Of Breath, Itching, Rash and Other (See Comments)    REACTION: difficulty breathing  . Metoprolol Tartrate Other (See Comments)    REACTION: loss of vision. Can take the XL tablet not regular   . Poison Ivy Extract     Severe rash and hives all over body  . Poison Oak Extract [Poison Oak Extract]     Severe rash and hives all over body  . Ramipril Rash and Cough     Current Outpatient Prescriptions  Medication Sig Dispense Refill  . ACCU-CHEK FASTCLIX LANCETS MISC USE TO CHECK BLOOD SUGAR TWICE A DAY AND AS NEEDED 306 each 3  . acetaminophen (TYLENOL ARTHRITIS PAIN) 650 MG CR tablet Take 1,300 mg by mouth 2 (two) times daily.    Marland Kitchen albuterol (PROVENTIL HFA;VENTOLIN HFA) 108 (90 Base) MCG/ACT inhaler Inhale 2 puffs into the lungs every 6 (six) hours as needed for wheezing or shortness of breath. 3 Inhaler 3  . amoxicillin-clavulanate (AUGMENTIN) 875-125 MG tablet Take 1 tablet by mouth 2 (two) times daily. 14 tablet 0  . Ascorbic Acid (VITAMIN C) 500 MG tablet Take 1,000 mg by mouth daily.     Marland Kitchen aspirin 81 MG tablet Take 81 mg by mouth every evening.     Marland Kitchen atorvastatin (LIPITOR) 40 MG tablet Take 1 tablet (40 mg total) by mouth daily at 6 PM. 90 tablet 3  . benzonatate (TESSALON) 100 MG capsule Take 1 capsule (100 mg total) by mouth 3 (three) times daily as needed for cough. 20 capsule 0  . calcium carbonate (OS-CAL) 600 MG TABS Take 600 mg by mouth 2  (two) times daily with a meal.    . cetirizine (ZYRTEC) 10 MG tablet Take 10 mg by mouth daily.     Marland Kitchen dimenhyDRINATE (DRAMAMINE) 50 MG tablet Take 25 mg by mouth daily.    Marland Kitchen doxycycline (VIBRA-TABS) 100 MG tablet Take 1 tablet (100 mg total) by mouth 2 (two) times daily. 20 tablet 0  . furosemide (LASIX) 40 MG tablet Take 40 mg by mouth daily.    . Glucosamine-Chondroitin 250-200 MG CAPS Take 2 capsules by mouth daily.     Marland Kitchen glucose blood (ACCU-CHEK SMARTVIEW) test strip USE TO CHECK BLOOD SUGAR TWICE A DAY AND PRN 300 each 3  . guaiFENesin-dextromethorphan (ROBITUSSIN DM) 100-10 MG/5ML syrup Take 5 mLs by mouth every 4 (four) hours as needed for cough.    . losartan (COZAAR) 100 MG tablet Take 1 tablet (100 mg total) by mouth daily. (Patient taking differently: Take 100 mg by mouth every evening. ) 90 tablet 3  . metFORMIN (GLUCOPHAGE) 1000 MG tablet TAKE 1 TABLET (1,000 MG TOTAL) BY MOUTH 2 (TWO) TIMES DAILY WITH A MEAL. 180 tablet 3  . metoprolol succinate (TOPROL-XL) 100 MG 24 hr tablet Take 100 mg by mouth every morning. Take with or immediately following a meal.    . Misc Natural Products (TART CHERRY ADVANCED) CAPS Take  2 capsules by mouth daily.     . Multiple Vitamin (MULTIVITAMIN) tablet Take 1 tablet by mouth daily.      . nitroGLYCERIN (NITROSTAT) 0.4 MG SL tablet Place 1 tablet (0.4 mg total) under the tongue every 5 (five) minutes as needed. For chest pain 25 tablet prn  . Omega-3 Fatty Acids (FISH OIL) 1200 MG CAPS Take 1,200 mg by mouth 2 (two) times daily.    . potassium chloride (K-DUR) 10 MEQ tablet Take 10 mEq by mouth daily.     . predniSONE (DELTASONE) 10 MG tablet 40mg  (4 tabs) x 2 days, then 20mg  (2 tabs) x 2 days, then 10mg  (1 tab) x 2 days 14 tablet 0  . Probiotic Product (PHILLIPS COLON HEALTH PO) Take 1 capsule by mouth daily.    . traMADol (ULTRAM) 50 MG tablet Take 50 mg by mouth every 6 (six) hours as needed for pain.     . vitamin B-12 (CYANOCOBALAMIN) 1000 MCG  tablet Take 1,000 mcg by mouth daily.    Marland Kitchen warfarin (COUMADIN) 10 MG tablet TAKE AS DIRECTED BY COUMADIN CLINIC (Patient taking differently: Take 10 mg by mouth daily at 6 PM. TAKE AS DIRECTED BY COUMADIN CLINIC) 90 tablet 1   No current facility-administered medications for this visit.      Past Medical History:  Diagnosis Date  . Aortic stenosis 08/25/2010   a. Bentall aortic root replacement with a St. Jude mechanical valve and Hemashield conduit 02/2004.  Marland Kitchen Arthritis   . Asthma   . AV BLOCK, COMPLETE    a. s/p St Jude dual chamber pacemaker 02/2004.  Marland Kitchen CAD (coronary artery disease)    a. s/p CABGx2 (LIMA-dLAD, SVG-Cx). b. Low risk nuc 12/2012 without ischemia, EF 46% mild apical hypokinesia (EF 55% inf HK by echo).  . Carotid artery disease ([place])    a. 0-39% bilateral ICA stenosis, stable mild hard plaque in carotid bulbs. F/u 03/2014 recommended.  . Diabetes mellitus without complication ([place])   . DIVERTICULOSIS, COLON 10/17/2007  . Ejection fraction    a. EF 55% with inf HK, mild MR by echo 12/2012.  Marland Kitchen GERD (gastroesophageal reflux disease)    hxof   . GOUT 01/16/2007  . HEMORRHOIDS, INTERNAL 11/01/2008  . HYPERLIPIDEMIA 01/16/2007  . HYPERTENSION 01/16/2007  . Impaired glucose tolerance   . LEG EDEMA 11/27/2008  . Special screening for malignant neoplasm of prostate   . Warfarin anticoagulation    AVR    ROS:   All systems reviewed and negative except as noted in the HPI.   Past Surgical History:  Procedure Laterality Date  . AORTIC VALVE REPLACEMENT    . CARDIAC CATHETERIZATION  06/2003  . CIRCUMCISION    . COLONOSCOPY    . CORONARY ARTERY BYPASS GRAFT    . coronary stents      prior to bypass   . EP IMPLANTABLE DEVICE N/A 05/06/2016   Procedure: PPM Generator Changeout;  Surgeon: Evans Lance, MD;  Location: [facility];  Service: Cardiovascular;  Laterality: N/A;  . HERNIA REPAIR    . INGUINAL HERNIA REPAIR Bilateral 02/07/2013   Procedure: LAPAROSCOPIC  BILATERAL INGUINAL HERNIA REPAIR;  Surgeon: Imogene Burn. Georgette Dover, MD;  Location: [facility];  Service: General;  Laterality: Bilateral;  . INSERTION OF MESH Bilateral 02/07/2013   Procedure: INSERTION OF MESH;  Surgeon: Imogene Burn. Georgette Dover, MD;  Location: [facility];  Service: General;  Laterality: Bilateral;  . PACEMAKER INSERTION    . SHOULDER SURGERY  Family History  Problem Relation Age of Onset  . Heart attack Father   . Cancer Father     renal cancer  . Hypertension Mother   . Diabetes Mother   . Diabetes Brother      Social History   Social History  . Marital status: Married    Spouse name: N/A  . Number of children: N/A  . Years of education: N/A   Occupational History  . Not on file.   Social History Main Topics  . Smoking status: Former Smoker    Quit date: 07/12/1973  . Smokeless tobacco: Never Used  . Alcohol use No  . Drug use: No  . Sexual activity: No   Other Topics Concern  . Not on file   Social History Narrative  . No narrative on file     BP (!) 142/84   Pulse 69   Ht 5\' 9"  (1.753 m)   Wt 232 lb 3.2 oz (105.3 kg)   SpO2 97%   BMI 34.29 kg/m   Physical Exam:  Well appearing 71 year old man,NAD HEENT: Unremarkable Neck:  7 cm JVD, no thyromegally Lungs:  Clear with no wheezes, rales, or rhonchi. HEART:  Regular rate rhythm, no murmurs, no rubs, no clicks, mechanical S2 Abd:  soft, positive bowel sounds, no organomegally, no rebound, no guarding Ext:  2 plus pulses, no edema, no cyanosis, no clubbing Skin:  No rashes no nodules Neuro:  CN II through XII intact, motor grossly intact  ECG - NSR with p synchronous ventricular pacing  DEVICE  Normal device function.  See PaceArt for details.   Assess/Plan: 1. Complete heart block - he is doing well, s/p PPM insertion 2. HTN - his blood pressure is fairly well controlled. Will follow. 3. PPM - his St. Jude DDD PM has been changed out and we will follow. 4. Obesity - I have encouraged the  patient to lose weight. He had lost over 30 lbs in the past but gained it back and then some.  Mikle Bosworth.D.

## 2016-08-19 ENCOUNTER — Ambulatory Visit (INDEPENDENT_AMBULATORY_CARE_PROVIDER_SITE_OTHER): Payer: Medicare HMO | Admitting: *Deleted

## 2016-08-19 DIAGNOSIS — Z7901 Long term (current) use of anticoagulants: Secondary | ICD-10-CM

## 2016-08-19 DIAGNOSIS — Z952 Presence of prosthetic heart valve: Secondary | ICD-10-CM | POA: Diagnosis not present

## 2016-08-19 DIAGNOSIS — I359 Nonrheumatic aortic valve disorder, unspecified: Secondary | ICD-10-CM | POA: Diagnosis not present

## 2016-08-19 DIAGNOSIS — Z5181 Encounter for therapeutic drug level monitoring: Secondary | ICD-10-CM | POA: Diagnosis not present

## 2016-08-19 LAB — POCT INR: INR: 2.2

## 2016-08-23 ENCOUNTER — Encounter: Payer: Self-pay | Admitting: Internal Medicine

## 2016-08-24 DIAGNOSIS — M170 Bilateral primary osteoarthritis of knee: Secondary | ICD-10-CM | POA: Diagnosis not present

## 2016-09-19 ENCOUNTER — Other Ambulatory Visit: Payer: Self-pay | Admitting: Internal Medicine

## 2016-09-23 ENCOUNTER — Ambulatory Visit (INDEPENDENT_AMBULATORY_CARE_PROVIDER_SITE_OTHER): Payer: Medicare HMO | Admitting: *Deleted

## 2016-09-23 DIAGNOSIS — Z7901 Long term (current) use of anticoagulants: Secondary | ICD-10-CM

## 2016-09-23 DIAGNOSIS — I359 Nonrheumatic aortic valve disorder, unspecified: Secondary | ICD-10-CM

## 2016-09-23 DIAGNOSIS — Z952 Presence of prosthetic heart valve: Secondary | ICD-10-CM | POA: Diagnosis not present

## 2016-09-23 DIAGNOSIS — Z5181 Encounter for therapeutic drug level monitoring: Secondary | ICD-10-CM | POA: Diagnosis not present

## 2016-09-23 LAB — POCT INR: INR: 2.1

## 2016-10-08 ENCOUNTER — Encounter: Payer: Commercial Managed Care - HMO | Admitting: Internal Medicine

## 2016-10-11 ENCOUNTER — Encounter: Payer: Self-pay | Admitting: Internal Medicine

## 2016-10-11 ENCOUNTER — Ambulatory Visit (INDEPENDENT_AMBULATORY_CARE_PROVIDER_SITE_OTHER): Payer: Medicare HMO | Admitting: Internal Medicine

## 2016-10-11 VITALS — BP 168/90 | HR 78 | Temp 97.6°F | Ht 69.0 in | Wt 232.6 lb

## 2016-10-11 DIAGNOSIS — I10 Essential (primary) hypertension: Secondary | ICD-10-CM | POA: Diagnosis not present

## 2016-10-11 DIAGNOSIS — E1139 Type 2 diabetes mellitus with other diabetic ophthalmic complication: Secondary | ICD-10-CM

## 2016-10-11 DIAGNOSIS — E785 Hyperlipidemia, unspecified: Secondary | ICD-10-CM | POA: Diagnosis not present

## 2016-10-11 DIAGNOSIS — Z0000 Encounter for general adult medical examination without abnormal findings: Secondary | ICD-10-CM

## 2016-10-11 DIAGNOSIS — IMO0001 Reserved for inherently not codable concepts without codable children: Secondary | ICD-10-CM

## 2016-10-11 DIAGNOSIS — Z7901 Long term (current) use of anticoagulants: Secondary | ICD-10-CM | POA: Diagnosis not present

## 2016-10-11 DIAGNOSIS — E119 Type 2 diabetes mellitus without complications: Secondary | ICD-10-CM | POA: Diagnosis not present

## 2016-10-11 LAB — CBC WITH DIFFERENTIAL/PLATELET
BASOS PCT: 0.5 % (ref 0.0–3.0)
Basophils Absolute: 0 10*3/uL (ref 0.0–0.1)
EOS PCT: 1.3 % (ref 0.0–5.0)
Eosinophils Absolute: 0.1 10*3/uL (ref 0.0–0.7)
HCT: 42.3 % (ref 39.0–52.0)
Hemoglobin: 13.8 g/dL (ref 13.0–17.0)
LYMPHS PCT: 20.2 % (ref 12.0–46.0)
Lymphs Abs: 1.6 10*3/uL (ref 0.7–4.0)
MCHC: 32.5 g/dL (ref 30.0–36.0)
MCV: 87 fl (ref 78.0–100.0)
MONO ABS: 0.5 10*3/uL (ref 0.1–1.0)
MONOS PCT: 6.4 % (ref 3.0–12.0)
NEUTROS ABS: 5.8 10*3/uL (ref 1.4–7.7)
NEUTROS PCT: 71.6 % (ref 43.0–77.0)
PLATELETS: 207 10*3/uL (ref 150.0–400.0)
RBC: 4.86 Mil/uL (ref 4.22–5.81)
RDW: 15 % (ref 11.5–15.5)
WBC: 8 10*3/uL (ref 4.0–10.5)

## 2016-10-11 LAB — COMPREHENSIVE METABOLIC PANEL
ALBUMIN: 4.3 g/dL (ref 3.5–5.2)
ALT: 15 U/L (ref 0–53)
AST: 22 U/L (ref 0–37)
Alkaline Phosphatase: 43 U/L (ref 39–117)
BUN: 18 mg/dL (ref 6–23)
CALCIUM: 9.6 mg/dL (ref 8.4–10.5)
CHLORIDE: 105 meq/L (ref 96–112)
CO2: 31 meq/L (ref 19–32)
Creatinine, Ser: 0.82 mg/dL (ref 0.40–1.50)
GFR: 98.08 mL/min (ref >60.00)
Glucose, Bld: 111 mg/dL — ABNORMAL HIGH (ref 70–99)
POTASSIUM: 4.3 meq/L (ref 3.5–5.1)
Sodium: 143 mEq/L (ref 135–145)
Total Bilirubin: 0.6 mg/dL (ref 0.2–1.2)
Total Protein: 6.3 g/dL (ref 6.0–8.3)

## 2016-10-11 LAB — LIPID PANEL
CHOL/HDL RATIO: 3
CHOLESTEROL: 139 mg/dL (ref 0–200)
HDL: 43.1 mg/dL (ref >39.00)
LDL CALC: 75 mg/dL (ref 0–99)
NonHDL: 95.4
TRIGLYCERIDES: 104 mg/dL (ref 0.0–149.0)
VLDL: 20.8 mg/dL (ref 0.0–40.0)

## 2016-10-11 LAB — HEMOGLOBIN A1C: HEMOGLOBIN A1C: 6.3 % (ref 4.6–6.5)

## 2016-10-11 LAB — TSH: TSH: 1.27 u[IU]/mL (ref 0.35–4.50)

## 2016-10-11 LAB — MICROALBUMIN / CREATININE URINE RATIO
CREATININE, U: 114.1 mg/dL
Microalb Creat Ratio: 0.6 mg/g (ref 0.0–30.0)

## 2016-10-11 NOTE — Progress Notes (Signed)
Subjective:    Patient ID: [name], male    DOB: 03/18/1946, 71 y.o.   MRN: [id number]  HPI  71 year old patient who is seen today for a preventive health examination and Medicare wellness visit He continues to do well.  His main complaint is bilateral knee pain.  He has advanced arthritis and does benefit from quarterly cortisone injections.  His orthopedic physician.  Doesn't suspect the need for total knee replacement in the future. He is status post aVR and remains on Coumadin anticoagulation.  He has essential hypertension and dyslipidemia.  He has controlled diabetes  Lab Results  Component Value Date   HGBA1C 6.1 01/12/2016   Past Medical History:  Diagnosis Date  . Aortic stenosis 08/25/2010   a. Bentall aortic root replacement with a St. Jude mechanical valve and Hemashield conduit 02/2004.  Marland Kitchen Arthritis   . Asthma   . AV BLOCK, COMPLETE    a. s/p St Jude dual chamber pacemaker 02/2004.  Marland Kitchen CAD (coronary artery disease)    a. s/p CABGx2 (LIMA-dLAD, SVG-Cx). b. Low risk nuc 12/2012 without ischemia, EF 46% mild apical hypokinesia (EF 55% inf HK by echo).  . Carotid artery disease ([place])    a. 0-39% bilateral ICA stenosis, stable mild hard plaque in carotid bulbs. F/u 03/2014 recommended.  . Diabetes mellitus without complication ([place])   . DIVERTICULOSIS, COLON 10/17/2007  . Ejection fraction    a. EF 55% with inf HK, mild MR by echo 12/2012.  Marland Kitchen GERD (gastroesophageal reflux disease)    hxof   . GOUT 01/16/2007  . HEMORRHOIDS, INTERNAL 11/01/2008  . HYPERLIPIDEMIA 01/16/2007  . HYPERTENSION 01/16/2007  . Impaired glucose tolerance   . LEG EDEMA 11/27/2008  . Special screening for malignant neoplasm of prostate   . Warfarin anticoagulation    AVR     Social History   Social History  . Marital status: Married    Spouse name: N/A  . Number of children: N/A  . Years of education: N/A   Occupational History  . Not on file.   Social History Main Topics  . Smoking  status: Former Smoker    Quit date: 07/12/1973  . Smokeless tobacco: Never Used  . Alcohol use No  . Drug use: No  . Sexual activity: No   Other Topics Concern  . Not on file   Social History Narrative  . No narrative on file    Past Surgical History:  Procedure Laterality Date  . AORTIC VALVE REPLACEMENT    . CARDIAC CATHETERIZATION  06/2003  . CIRCUMCISION    . COLONOSCOPY    . CORONARY ARTERY BYPASS GRAFT    . coronary stents      prior to bypass   . EP IMPLANTABLE DEVICE N/A 05/06/2016   Procedure: PPM Generator Changeout;  Surgeon: Evans Lance, MD;  Location: [facility];  Service: Cardiovascular;  Laterality: N/A;  . HERNIA REPAIR    . INGUINAL HERNIA REPAIR Bilateral 02/07/2013   Procedure: LAPAROSCOPIC BILATERAL INGUINAL HERNIA REPAIR;  Surgeon: Imogene Burn. Georgette Dover, MD;  Location: [facility];  Service: General;  Laterality: Bilateral;  . INSERTION OF MESH Bilateral 02/07/2013   Procedure: INSERTION OF MESH;  Surgeon: Imogene Burn. Georgette Dover, MD;  Location: [facility];  Service: General;  Laterality: Bilateral;  . PACEMAKER INSERTION    . SHOULDER SURGERY      Family History  Problem Relation Age of Onset  . Heart attack Father   . Cancer Father  renal cancer  . Hypertension Mother   . Diabetes Mother   . Diabetes Brother     Allergies  Allergen Reactions  . Bee Venom Shortness Of Breath and Swelling  . Codeine Phosphate Hives, Shortness Of Breath, Itching, Rash and Other (See Comments)    REACTION: difficulty breathing  . Metoprolol Tartrate Other (See Comments)    REACTION: loss of vision. Can take the XL tablet not regular   . Poison Ivy Extract     Severe rash and hives all over body  . Poison Oak Extract [Poison Oak Extract]     Severe rash and hives all over body  . Ramipril Rash and Cough    Current Outpatient Prescriptions on File Prior to Visit  Medication Sig Dispense Refill  . ACCU-CHEK FASTCLIX LANCETS MISC USE TO CHECK BLOOD SUGAR TWICE A DAY AND  AS NEEDED 306 each 3  . acetaminophen (TYLENOL ARTHRITIS PAIN) 650 MG CR tablet Take 1,300 mg by mouth 2 (two) times daily.    Marland Kitchen albuterol (PROVENTIL HFA;VENTOLIN HFA) 108 (90 Base) MCG/ACT inhaler Inhale 2 puffs into the lungs every 6 (six) hours as needed for wheezing or shortness of breath. 3 Inhaler 3  . amoxicillin-clavulanate (AUGMENTIN) 875-125 MG tablet Take 1 tablet by mouth 2 (two) times daily. 14 tablet 0  . Ascorbic Acid (VITAMIN C) 500 MG tablet Take 1,000 mg by mouth daily.     Marland Kitchen aspirin 81 MG tablet Take 81 mg by mouth every evening.     Marland Kitchen atorvastatin (LIPITOR) 40 MG tablet Take 1 tablet (40 mg total) by mouth daily at 6 PM. 90 tablet 3  . benzonatate (TESSALON) 100 MG capsule Take 1 capsule (100 mg total) by mouth 3 (three) times daily as needed for cough. 20 capsule 0  . calcium carbonate (OS-CAL) 600 MG TABS Take 600 mg by mouth 2 (two) times daily with a meal.    . cetirizine (ZYRTEC) 10 MG tablet Take 10 mg by mouth daily.     Marland Kitchen dimenhyDRINATE (DRAMAMINE) 50 MG tablet Take 25 mg by mouth daily.    Marland Kitchen doxycycline (VIBRA-TABS) 100 MG tablet Take 1 tablet (100 mg total) by mouth 2 (two) times daily. 20 tablet 0  . furosemide (LASIX) 40 MG tablet Take 40 mg by mouth daily.    . Glucosamine-Chondroitin 250-200 MG CAPS Take 2 capsules by mouth daily.     Marland Kitchen glucose blood (ACCU-CHEK SMARTVIEW) test strip USE TO CHECK BLOOD SUGAR TWICE A DAY AND PRN 300 each 3  . guaiFENesin-dextromethorphan (ROBITUSSIN DM) 100-10 MG/5ML syrup Take 5 mLs by mouth every 4 (four) hours as needed for cough.    . losartan (COZAAR) 100 MG tablet Take 1 tablet (100 mg total) by mouth daily. (Patient taking differently: Take 100 mg by mouth every evening. ) 90 tablet 3  . metFORMIN (GLUCOPHAGE) 1000 MG tablet TAKE 1 TABLET (1,000 MG TOTAL) BY MOUTH 2 (TWO) TIMES DAILY WITH A MEAL. 180 tablet 3  . metoprolol succinate (TOPROL-XL) 100 MG 24 hr tablet TAKE 1 TABLET EVERY MORNING WITH OR IMMEDIATELY FOLLOWING A  MEAL 90 tablet 1  . Misc Natural Products (TART CHERRY ADVANCED) CAPS Take 2 capsules by mouth daily.     . Multiple Vitamin (MULTIVITAMIN) tablet Take 1 tablet by mouth daily.      . nitroGLYCERIN (NITROSTAT) 0.4 MG SL tablet Place 1 tablet (0.4 mg total) under the tongue every 5 (five) minutes as needed. For chest pain 25 tablet prn  .  Omega-3 Fatty Acids (FISH OIL) 1200 MG CAPS Take 1,200 mg by mouth 2 (two) times daily.    . potassium chloride (K-DUR) 10 MEQ tablet Take 10 mEq by mouth daily.     . predniSONE (DELTASONE) 10 MG tablet 40mg  (4 tabs) x 2 days, then 20mg  (2 tabs) x 2 days, then 10mg  (1 tab) x 2 days 14 tablet 0  . Probiotic Product (PHILLIPS COLON HEALTH PO) Take 1 capsule by mouth daily.    . traMADol (ULTRAM) 50 MG tablet Take 50 mg by mouth every 6 (six) hours as needed for pain.     . vitamin B-12 (CYANOCOBALAMIN) 1000 MCG tablet Take 1,000 mcg by mouth daily.    Marland Kitchen warfarin (COUMADIN) 10 MG tablet TAKE AS DIRECTED BY COUMADIN CLINIC (Patient taking differently: Take 10 mg by mouth daily at 6 PM. TAKE AS DIRECTED BY COUMADIN CLINIC) 90 tablet 1   No current facility-administered medications on file prior to visit.     BP (!) 168/90 (BP Location: Left Arm, Patient Position: Sitting, Cuff Size: Normal)   Pulse 78   Temp 97.6 F (36.4 C) (Oral)   Ht 5\' 9"  (1.753 m)   Wt 232 lb 9.6 oz (105.5 kg)   SpO2 98%   BMI 34.35 kg/m   Medicare wellness visit  1. Risk factors, based on past  M,S,F history required cardiac factors include hypertension and dyslipidemia as well as diet-controlled diabetes;  Patient has known coronary artery disease  2.  Physical activities:walks daily.  No exercise limitation  3.  Depression/mood: no history of major depression or mood disorder.   4.  Hearing:moderate deficits  5.  ADL's:independent  6.  Fall risk:low  7.  Home safety:no problems identified  8.  Height weight, and visual acuity;height and weight stable no change in visual  acuity  9.  Counseling:continue heart healthy diet and regular exercise modest weight loss encouraged  10. Lab orders based on risk factors: laboratory profile will be reviewed to include hemoglobin A1c and lipid profile  11. Referral :needs follow-up colonoscopy.  Will need bridging with Lovenox  12. Care plan:follow colonoscopy.  Continue efforts at aggressive risk factor modification  13. Cognitive assessment: alert and oriented with normal affect no cognitive dysfunction  14. Screening: Patient provided with a written and personalized 5-10 year screening schedule in the AVS.    15. Provider List Update:  Primary care GI ophthalmology   Review of Systems  Constitutional: Negative for activity change, appetite change, chills, fatigue and fever.  HENT: Negative for congestion, dental problem, ear pain, hearing loss, mouth sores, rhinorrhea, sinus pressure, sneezing, tinnitus, trouble swallowing and voice change.   Eyes: Negative for photophobia, pain, redness and visual disturbance.  Respiratory: Negative for apnea, cough, choking, chest tightness, shortness of breath and wheezing.   Cardiovascular: Positive for leg swelling. Negative for chest pain and palpitations.  Gastrointestinal: Negative for abdominal distention, abdominal pain, anal bleeding, blood in stool, constipation, diarrhea, nausea, rectal pain and vomiting.  Genitourinary: Negative for decreased urine volume, difficulty urinating, discharge, dysuria, flank pain, frequency, genital sores, hematuria, penile swelling, scrotal swelling, testicular pain and urgency.  Musculoskeletal: Positive for gait problem. Negative for arthralgias, back pain, joint swelling, myalgias, neck pain and neck stiffness.       Bilateral knee pain  Skin: Negative for color change, rash and wound.  Neurological: Negative for dizziness, tremors, seizures, syncope, facial asymmetry, speech difficulty, weakness, light-headedness, numbness and  headaches.  Hematological: Negative for adenopathy. Does  not bruise/bleed easily.  Psychiatric/Behavioral: Negative for agitation, behavioral problems, confusion, decreased concentration, dysphoric mood, hallucinations, self-injury, sleep disturbance and suicidal ideas. The patient is not nervous/anxious.        Objective:   Physical Exam  Constitutional: He appears well-developed and well-nourished.  Repeat blood pressure 140/82 ( no medications today)  HENT:  Head: Normocephalic and atraumatic.  Right Ear: External ear normal.  Left Ear: External ear normal.  Nose: Nose normal.  Mouth/Throat: Oropharynx is clear and moist.  Eyes: Conjunctivae and EOM are normal. Pupils are equal, round, and reactive to light. No scleral icterus.  Neck: Normal range of motion. Neck supple. No JVD present. No thyromegaly present.  Cardiovascular: Regular rhythm, normal heart sounds and intact distal pulses.  Exam reveals no gallop and no friction rub.   No murmur heard. Prosthetic heart sounds  Pulmonary/Chest: Effort normal and breath sounds normal. He exhibits no tenderness.  Status post sternotomy Surgical scar right upper anterior chest wall and right shoulder area  Abdominal: Soft. Bowel sounds are normal. He exhibits no distension and no mass. There is no tenderness.  Genitourinary: Prostate normal and penis normal. Rectal exam shows guaiac negative stool.  Musculoskeletal: Normal range of motion. He exhibits edema. He exhibits no tenderness.  Right greater than left pedal edema  Lymphadenopathy:    He has no cervical adenopathy.  Neurological: He is alert. He has normal reflexes. No cranial nerve deficit. Coordination normal.  Skin: Skin is warm and dry. No rash noted.  Psychiatric: He has a normal mood and affect. His behavior is normal.          Assessment & Plan:   Preventive health exam Medicare wellness visit Status post aVR Chronic Coumadin anticoagulation Osteoarthritis both  knees Essential hypertension, stable Coronary artery disease Diet-controlled diabetes.  Will check hemoglobin A1c, urine for microalbumin Dyslipidemia.  Continue statin therapy  Follow-up 6 months  [name] Pilar Plate

## 2016-10-11 NOTE — Progress Notes (Signed)
Pre visit review using our clinic review tool, if applicable. No additional management support is needed unless otherwise documented below in the visit note. 

## 2016-10-11 NOTE — Patient Instructions (Signed)
Limit your sodium (Salt) intake    It is important that you exercise regularly, at least 20 minutes 3 to 4 times per week.  If you develop chest pain or shortness of breath seek  medical attention.  You need to lose weight.  Consider a lower calorie diet and regular exercise.  Return in 6 months for follow-up  Schedule your colonoscopy to help detect colon cancer.

## 2016-10-12 LAB — HEPATITIS C ANTIBODY: HCV Ab: NEGATIVE

## 2016-10-13 ENCOUNTER — Encounter: Payer: Self-pay | Admitting: Internal Medicine

## 2016-10-29 ENCOUNTER — Other Ambulatory Visit: Payer: Self-pay | Admitting: Internal Medicine

## 2016-10-31 NOTE — Progress Notes (Signed)
Cardiology Office Note    Date:  11/02/2016   ID:  [name], DOB [birth year], MRN [id number]  PCP:  Nyoka Cowden, MD  Cardiologist:  Dr. Marlou Porch / Dr. Lovena Le (EP)  CC: follow up - 6 months  History of Present Illness:  [name] is a 71 y.o. male with a history of AS s/p mechanical AVR with Bentall procedure (2005) on coumadin, CAD s/p CABG x2V (LIMA--> LAD, SVG--> LCx in 2005 at the time of AVR), surgery c/b CHB s/p PPM (2005, gen change 04/2016), carotid artery disease, DMT2, HTN, HLD and obesity who presents to clinic for follow up.   2D ECHO in 01/2016 showed normal LV function with a well functioning prosthesis. Last nuclear stress test was in 2013 was low risk. He was last seen by Dr. Marlou Porch in 04/2016 and doing well.  Today he presents to clinic for follow up. He has been feeling tired lately mostly due to allergies. He never gets chest pain aside from the ache he gets in his sternal incision site when its cold. No SOB. He had chronic LE edema which he attributes this bad knees, he sleeps in a recliner because its easier to breath and feels better on his knees. He occasionally has coughing in the middle of the night. He has mild, intermittent dizziness but no syncope. No blood in stool or urine. No palpitations. INRs have been good and he will see coumadin today. He does walk everyday and cuts the grass with no exertional chest pain or SOB. More vigorous activity limited by knee pain. He may require surgery in the near future.     Past Medical History:  Diagnosis Date  . Aortic stenosis 08/25/2010   a. Bentall aortic root replacement with a St. Jude mechanical valve and Hemashield conduit 02/2004.  Marland Kitchen Arthritis   . Asthma   . AV BLOCK, COMPLETE    a. s/p St Jude dual chamber pacemaker 02/2004.  Marland Kitchen CAD (coronary artery disease)    a. s/p CABGx2 (LIMA-dLAD, SVG-Cx). b. Low risk nuc 12/2012 without ischemia, EF 46% mild apical hypokinesia (EF 55% inf HK by echo).    . Carotid artery disease ([place])    a. 0-39% bilateral ICA stenosis, stable mild hard plaque in carotid bulbs. F/u 03/2014 recommended.  . Diabetes mellitus without complication ([place])   . DIVERTICULOSIS, COLON 10/17/2007  . Ejection fraction    a. EF 55% with inf HK, mild MR by echo 12/2012.  Marland Kitchen GERD (gastroesophageal reflux disease)    hxof   . GOUT 01/16/2007  . HEMORRHOIDS, INTERNAL 11/01/2008  . HYPERLIPIDEMIA 01/16/2007  . HYPERTENSION 01/16/2007  . Impaired glucose tolerance   . LEG EDEMA 11/27/2008  . Special screening for malignant neoplasm of prostate   . Warfarin anticoagulation    AVR    Past Surgical History:  Procedure Laterality Date  . AORTIC VALVE REPLACEMENT    . CARDIAC CATHETERIZATION  06/2003  . CIRCUMCISION    . COLONOSCOPY    . CORONARY ARTERY BYPASS GRAFT    . coronary stents      prior to bypass   . EP IMPLANTABLE DEVICE N/A 05/06/2016   Procedure: PPM Generator Changeout;  Surgeon: Evans Lance, MD;  Location: [facility];  Service: Cardiovascular;  Laterality: N/A;  . HERNIA REPAIR    . INGUINAL HERNIA REPAIR Bilateral 02/07/2013   Procedure: LAPAROSCOPIC BILATERAL INGUINAL HERNIA REPAIR;  Surgeon: Imogene Burn. Tsuei, MD;  Location: [facility];  Service:  General;  Laterality: Bilateral;  . INSERTION OF MESH Bilateral 02/07/2013   Procedure: INSERTION OF MESH;  Surgeon: Imogene Burn. Georgette Dover, MD;  Location: [facility];  Service: General;  Laterality: Bilateral;  . PACEMAKER INSERTION    . SHOULDER SURGERY      Current Medications: Outpatient Medications Prior to Visit  Medication Sig Dispense Refill  . ACCU-CHEK FASTCLIX LANCETS MISC USE TO CHECK BLOOD SUGAR TWICE A DAY AND AS NEEDED 306 each 3  . acetaminophen (TYLENOL ARTHRITIS PAIN) 650 MG CR tablet Take 1,300 mg by mouth 2 (two) times daily.    Marland Kitchen albuterol (PROVENTIL HFA;VENTOLIN HFA) 108 (90 Base) MCG/ACT inhaler Inhale 2 puffs into the lungs every 6 (six) hours as needed for wheezing or shortness of breath. 3  Inhaler 3  . Ascorbic Acid (VITAMIN C) 500 MG tablet Take 1,000 mg by mouth daily.     Marland Kitchen aspirin 81 MG tablet Take 81 mg by mouth every evening.     Marland Kitchen atorvastatin (LIPITOR) 40 MG tablet Take 1 tablet (40 mg total) by mouth daily at 6 PM. 90 tablet 3  . benzonatate (TESSALON) 100 MG capsule Take 1 capsule (100 mg total) by mouth 3 (three) times daily as needed for cough. 20 capsule 0  . calcium carbonate (OS-CAL) 600 MG TABS Take 600 mg by mouth 2 (two) times daily with a meal.    . cetirizine (ZYRTEC) 10 MG tablet Take 10 mg by mouth daily.     . furosemide (LASIX) 40 MG tablet Take 40 mg by mouth daily.    . Glucosamine-Chondroitin 250-200 MG CAPS Take 2 capsules by mouth daily.     Marland Kitchen glucose blood (ACCU-CHEK SMARTVIEW) test strip USE TO CHECK BLOOD SUGAR TWICE A DAY AND PRN 300 each 3  . guaiFENesin-dextromethorphan (ROBITUSSIN DM) 100-10 MG/5ML syrup Take 5 mLs by mouth every 4 (four) hours as needed for cough.    . metFORMIN (GLUCOPHAGE) 1000 MG tablet TAKE 1 TABLET (1,000 MG TOTAL) BY MOUTH 2 (TWO) TIMES DAILY WITH A MEAL. 180 tablet 3  . metoprolol succinate (TOPROL-XL) 100 MG 24 hr tablet TAKE 1 TABLET EVERY MORNING WITH OR IMMEDIATELY FOLLOWING A MEAL 90 tablet 1  . Misc Natural Products (TART CHERRY ADVANCED) CAPS Take 2 capsules by mouth daily.     . Multiple Vitamin (MULTIVITAMIN) tablet Take 1 tablet by mouth daily.      . nitroGLYCERIN (NITROSTAT) 0.4 MG SL tablet Place 1 tablet (0.4 mg total) under the tongue every 5 (five) minutes as needed. For chest pain 25 tablet prn  . Omega-3 Fatty Acids (FISH OIL) 1200 MG CAPS Take 1,200 mg by mouth 2 (two) times daily.    . potassium chloride (K-DUR) 10 MEQ tablet Take 10 mEq by mouth daily.     . Probiotic Product (PHILLIPS COLON HEALTH PO) Take 1 capsule by mouth daily.    . traMADol (ULTRAM) 50 MG tablet Take 50 mg by mouth every 6 (six) hours as needed for pain.     . vitamin B-12 (CYANOCOBALAMIN) 1000 MCG tablet Take 1,000 mcg by mouth  daily.    Marland Kitchen warfarin (COUMADIN) 10 MG tablet TAKE AS DIRECTED BY COUMADIN CLINIC (Patient taking differently: Take 10 mg by mouth daily at 6 PM. TAKE AS DIRECTED BY COUMADIN CLINIC) 90 tablet 1  . losartan (COZAAR) 100 MG tablet Take 1 tablet (100 mg total) by mouth daily. (Patient taking differently: Take 100 mg by mouth every evening. ) 90 tablet 3  No facility-administered medications prior to visit.      Allergies:   Bee venom; Codeine phosphate; Metoprolol tartrate; Poison ivy extract; Poison oak extract [poison oak extract]; and Ramipril   Social History   Social History  . Marital status: Married    Spouse name: N/A  . Number of children: N/A  . Years of education: N/A   Social History Main Topics  . Smoking status: Former Smoker    Quit date: 07/12/1973  . Smokeless tobacco: Never Used  . Alcohol use No  . Drug use: No  . Sexual activity: No   Other Topics Concern  . None   Social History Narrative  . None     Family History:  The patient's family history includes Cancer in his father; Diabetes in his brother and mother; Heart attack in his father; Hypertension in his mother.      ROS:   Please see the history of present illness.    ROS All other systems reviewed and are negative.   PHYSICAL EXAM:   VS:  BP 120/70   Pulse 69   Ht 5\' 7"  (1.702 m)   Wt 232 lb 12.8 oz (105.6 kg)   SpO2 97%   BMI 36.46 kg/m    GEN: Well nourished, well developed, in no acute distress, obese HEENT: normal  Neck: no JVD, carotid bruits, or masses Cardiac: RRR; no murmurs, rubs, or gallops, 1+ bilateral LE edema, Crisp mechanical click Respiratory:  clear to auscultation bilaterally, normal work of breathing GI: soft, nontender, nondistended, + BS MS: no deformity or atrophy  Skin: warm and dry, no rash Neuro:  Alert and Oriented x 3, Strength and sensation are intact Psych: euthymic mood, full affect   Wt Readings from Last 3 Encounters:  11/02/16 232 lb 12.8 oz (105.6  kg)  10/11/16 232 lb 9.6 oz (105.5 kg)  08/13/16 232 lb 3.2 oz (105.3 kg)      Studies/Labs Reviewed:   EKG:  EKG is NOT ordered today.    Recent Labs: 01/26/2016: B Natriuretic Peptide 102.9 10/11/2016: ALT 15; BUN 18; Creatinine, Ser 0.82; Hemoglobin 13.8; Platelets 207.0; Potassium 4.3; Sodium 143; TSH 1.27   Lipid Panel    Component Value Date/Time   CHOL 139 10/11/2016 1327   TRIG 104.0 10/11/2016 1327   TRIG 91 06/14/2006 0825   HDL 43.10 10/11/2016 1327   CHOLHDL 3 10/11/2016 1327   VLDL 20.8 10/11/2016 1327   LDLCALC 75 10/11/2016 1327    Additional studies/ records that were reviewed today include:  ECHO 02/06/16: - Left ventricle: LVEF is approximately 55% with hypokinesis of the distal inferior/inferoseptal walls. The cavity size was normal. Wall thickness was normal. Doppler parameters are consistent with abnormal left ventricular relaxation (grade 1 diastolic dysfunction). - Aortic valve: AV prosthesis appears to move well Peak and mean gradients through the valve are 16 and 10 mm Hg respectively. There was trivial regurgitation. Valve area (VTI): 2.77 cm^2. Valve area (Vmax): 2.63 cm^2. Valve area (Vmean): 2.55 cm^2. - Right ventricle: The cavity size was mildly dilated. Wall thickness was normal. - Right atrium: The atrium was mildly dilated.  Carotid duplex 2013: 0-39% bilateral ICA stenosis, stable mild hard plaque in carotid bulbs.  ASSESSMENT & PLAN:   AS s/p mechanical AVR with Bentall procedure (2005): valve functioning well on echo in 2017. Crisp mechanical click on exam. Continue coumadin.  INRs have been in good range. He see's coumadin clinic today.   CAD s/p CABG (2005): at the  time of AVR. Last stress test in 2013 was low risk. He has not had any chest pain or SOB. Continue ASA, statin and BB.   HLD: LDL 75 earlier this month. Followed by PCP, Dr. Raliegh Ip. Continue atorva 40mg  daily.   HTN: Bp under excellent control  today  Carotid artery disease: 1-39% bilateral stenosis in 2015.   CHB s/p PPM with recent gen change: followed by Dr. Lovena Le  Obesity: Body mass index is 36.46 kg/m. He has made a lot of progress with getting his HgA1c down and working to loose more weight  Dispo: he may need knee surgery and colonoscopy soon. I have asked him to call our office for clearance and to arrange bridging when these are scheduled.   Medication Adjustments/Labs and Tests Ordered: Current medicines are reviewed at length with the patient today.  Concerns regarding medicines are outlined above.  Medication changes, Labs and Tests ordered today are listed in the Patient Instructions below. Patient Instructions  Medication Instructions:  Your physician recommends that you continue on your current medications as directed. Please refer to the Current Medication list given to you today.   Labwork: None ordered  Testing/Procedures: None ordered  Follow-Up: Your physician recommends that you schedule a follow-up appointment in: AS PLANNED   Any Other Special Instructions Will Be Listed Below (If Applicable).   If you need a refill on your cardiac medications before your next appointment, please call your pharmacy.      Signed, Angelena Form, PA-C  11/02/2016 10:11 AM    [facility] [place], [place], [place]  [address] Phone: [phone]; Fax: [phone]

## 2016-11-02 ENCOUNTER — Encounter: Payer: Self-pay | Admitting: Physician Assistant

## 2016-11-02 ENCOUNTER — Ambulatory Visit (INDEPENDENT_AMBULATORY_CARE_PROVIDER_SITE_OTHER): Payer: Medicare HMO | Admitting: Physician Assistant

## 2016-11-02 ENCOUNTER — Ambulatory Visit (INDEPENDENT_AMBULATORY_CARE_PROVIDER_SITE_OTHER): Payer: Medicare HMO

## 2016-11-02 VITALS — BP 120/70 | HR 69 | Ht 67.0 in | Wt 232.8 lb

## 2016-11-02 DIAGNOSIS — Z952 Presence of prosthetic heart valve: Secondary | ICD-10-CM

## 2016-11-02 DIAGNOSIS — I359 Nonrheumatic aortic valve disorder, unspecified: Secondary | ICD-10-CM

## 2016-11-02 DIAGNOSIS — Z5181 Encounter for therapeutic drug level monitoring: Secondary | ICD-10-CM

## 2016-11-02 DIAGNOSIS — I10 Essential (primary) hypertension: Secondary | ICD-10-CM

## 2016-11-02 DIAGNOSIS — I779 Disorder of arteries and arterioles, unspecified: Secondary | ICD-10-CM | POA: Diagnosis not present

## 2016-11-02 DIAGNOSIS — Z7901 Long term (current) use of anticoagulants: Secondary | ICD-10-CM

## 2016-11-02 DIAGNOSIS — Z951 Presence of aortocoronary bypass graft: Secondary | ICD-10-CM

## 2016-11-02 DIAGNOSIS — I739 Peripheral vascular disease, unspecified: Secondary | ICD-10-CM

## 2016-11-02 DIAGNOSIS — Z950 Presence of cardiac pacemaker: Secondary | ICD-10-CM | POA: Diagnosis not present

## 2016-11-02 DIAGNOSIS — E785 Hyperlipidemia, unspecified: Secondary | ICD-10-CM | POA: Diagnosis not present

## 2016-11-02 LAB — POCT INR: INR: 2

## 2016-11-02 MED ORDER — NITROGLYCERIN 0.4 MG SL SUBL: 0.4000 mg | SUBLINGUAL_TABLET | SUBLINGUAL | 3 refills | Status: DC | PRN

## 2016-11-02 NOTE — Patient Instructions (Addendum)
Medication Instructions:  Your physician recommends that you continue on your current medications as directed. Please refer to the Current Medication list given to you today.   Labwork: None ordered  Testing/Procedures: None ordered  Follow-Up: Your physician recommends that you schedule a follow-up appointment in: AS PLANNED   Any Other Special Instructions Will Be Listed Below (If Applicable).   If you need a refill on your cardiac medications before your next appointment, please call your pharmacy.

## 2016-11-15 ENCOUNTER — Ambulatory Visit (INDEPENDENT_AMBULATORY_CARE_PROVIDER_SITE_OTHER): Payer: Medicare HMO | Admitting: *Deleted

## 2016-11-15 DIAGNOSIS — I442 Atrioventricular block, complete: Secondary | ICD-10-CM | POA: Diagnosis not present

## 2016-11-16 NOTE — Progress Notes (Signed)
Remote pacemaker transmission.   

## 2016-11-17 LAB — CUP PACEART REMOTE DEVICE CHECK
Battery Remaining Longevity: 126 mo
Battery Voltage: 3.01 V
Brady Statistic AP VP Percent: 27 %
Brady Statistic AP VS Percent: 1 %
Brady Statistic RA Percent Paced: 27 %
Date Time Interrogation Session: 20180507060014
Implantable Lead Implant Date: 20050816
Implantable Lead Implant Date: 20050816
Implantable Lead Location: 753859
Implantable Lead Location: 753860
Lead Channel Impedance Value: 700 Ohm
Lead Channel Pacing Threshold Amplitude: 1 V
Lead Channel Sensing Intrinsic Amplitude: 3.1 mV
Lead Channel Setting Pacing Pulse Width: 0.5 ms
Lead Channel Setting Sensing Sensitivity: 4 mV
MDC IDC MSMT BATTERY REMAINING PERCENTAGE: 95.5 %
MDC IDC MSMT LEADCHNL RA IMPEDANCE VALUE: 490 Ohm
MDC IDC MSMT LEADCHNL RA PACING THRESHOLD PULSEWIDTH: 0.5 ms
MDC IDC MSMT LEADCHNL RV PACING THRESHOLD AMPLITUDE: 0.5 V
MDC IDC MSMT LEADCHNL RV PACING THRESHOLD PULSEWIDTH: 0.5 ms
MDC IDC PG IMPLANT DT: 20171026
MDC IDC SET LEADCHNL RA PACING AMPLITUDE: 2 V
MDC IDC SET LEADCHNL RV PACING AMPLITUDE: 0.75 V
MDC IDC STAT BRADY AS VP PERCENT: 73 %
MDC IDC STAT BRADY AS VS PERCENT: 1 %
MDC IDC STAT BRADY RV PERCENT PACED: 99 %
Pulse Gen Model: 2272
Pulse Gen Serial Number: 3182792

## 2016-11-19 ENCOUNTER — Encounter: Payer: Self-pay | Admitting: Cardiology

## 2016-11-22 DIAGNOSIS — M170 Bilateral primary osteoarthritis of knee: Secondary | ICD-10-CM | POA: Diagnosis not present

## 2016-11-25 ENCOUNTER — Telehealth: Payer: Self-pay

## 2016-11-25 ENCOUNTER — Encounter: Payer: Self-pay | Admitting: Internal Medicine

## 2016-11-25 ENCOUNTER — Ambulatory Visit (INDEPENDENT_AMBULATORY_CARE_PROVIDER_SITE_OTHER): Payer: Medicare HMO | Admitting: Internal Medicine

## 2016-11-25 VITALS — BP 144/70 | HR 68 | Ht 66.5 in | Wt 231.1 lb

## 2016-11-25 DIAGNOSIS — Z952 Presence of prosthetic heart valve: Secondary | ICD-10-CM

## 2016-11-25 DIAGNOSIS — Z1211 Encounter for screening for malignant neoplasm of colon: Secondary | ICD-10-CM

## 2016-11-25 DIAGNOSIS — I2510 Atherosclerotic heart disease of native coronary artery without angina pectoris: Secondary | ICD-10-CM | POA: Diagnosis not present

## 2016-11-25 DIAGNOSIS — Z7901 Long term (current) use of anticoagulants: Secondary | ICD-10-CM | POA: Diagnosis not present

## 2016-11-25 NOTE — Patient Instructions (Signed)
   You have been scheduled for a colonoscopy. Please follow written instructions given to you at your visit today.  Please pick up your prep supplies at the pharmacy. If you use inhalers (even only as needed), please bring them with you on the day of your procedure.    You will be contaced by our office prior to your procedure for directions on holding your Coumadin/Warfarin.  If you do not hear from our office 1 week prior to your scheduled procedure, please call [phone] to discuss.    I appreciate the opportunity to care for you. Carl Gessner, MD, FACG 

## 2016-11-25 NOTE — Telephone Encounter (Signed)
[facility] [address]  [place] [address]  11/25/2016   RE: [name] DOB: 1946/03/16 MRN: [id number]   Dear Candee Furbish M.D.,    We have scheduled the above patient for an endoscopic procedure. Our records show that he is on anticoagulation therapy.   Please advise as to how long the patient may come off his therapy of warfarin prior to the colonoscopy procedure, which is scheduled for 02/08/17.  He has had to be Lovenox bridged in the past.    Please fax back/ or route the completed form to [name], [name] at [phone].   Sincerely,    Silvano Rusk, MD, [facility]

## 2016-11-25 NOTE — Progress Notes (Signed)
   Snyder Colavito Grindstaff 71 y.o. 1945/07/20 [id number]  Assessment & Plan:   Encounter Diagnoses  Name Primary?  . Colon cancer screening Yes  . Warfarin anticoagulation   . S/P AVR (aortic valve replacement)   . Coronary artery disease involving native coronary artery of native heart without angina pectoris    We reviewed options and have decided to repeat a colonoscopy.  Will need to hold warfarin - ? Needs Lovenox window - will check with cardiology  The risks and benefits as well as alternatives of endoscopic procedure(s) have been discussed and reviewed. All questions answered. The patient agrees to proceed. Extra risk of stroke off warfarin explained  I appreciate the opportunity to care for this patient.  Subjective:   Chief Complaint: Colon cancer screening, takes warfarin  HPI Patient is here with his wife. 10 years ago he had a negative screening colonoscopy. He is not having any major GI symptoms though rarely will see some bright red blood with wiping that he attributes to hemorrhoids. He does take warfarin chronically after aortic valve, St. Jude's, and aortic root repair. Has had coronary artery bypass grafting as well. He seems to move around without significant dyspnea and no angina. His knees and arthritis limit his ambulation more than anything. He may be headed towards a right total knee replacement and says that the cardiology PA told him there were no contraindications to him having that.  He had a double hernia repair a couple of years ago and had a Lovenox window, and had significant hemorrhage after the Lovenox is restarted after hernia repair.  Medications, allergies, past medical history, past surgical history, family history and social history are reviewed and updated in the EMR.  Review of systems  joint pains especially knee, some back pain.  All other review of systems reported to be negative   Objective:   Physical Exam BP (!) 144/70 (BP Location:  Left Arm, Patient Position: Sitting, Cuff Size: Normal)   Pulse 68   Ht 5' 6.5" (1.689 m) Comment: height measured without shoes  Wt 231 lb 2 oz (104.8 kg)   BMI 36.75 kg/m  NAD Eyes anicteric Lungs CTA Ht S1 metallic S2 abd obese Ext no cyanosis or clubbing trace R>L LE edema

## 2016-11-29 NOTE — Telephone Encounter (Signed)
Pt will hold Coumadin x5 days with Lovenox bridge per Dr Marlou Porch' recommendation. Will coordinate bridge in Coumadin clinic. Clearance routed to Patti Martinique.

## 2016-11-29 NOTE — Telephone Encounter (Signed)
Please help him set up Lovenox bridge, mechanical aortic valve, endoscopy procedure. He has used Lovenox bridge in the past. Thanks Candee Furbish, MD

## 2016-11-29 NOTE — Telephone Encounter (Signed)
Patient is cutting the grass, left message for him to call me back.

## 2016-11-29 NOTE — Telephone Encounter (Signed)
Informed patient and his wife of the anti-coag coumadin plan with the Lovenox bridge.

## 2016-11-29 NOTE — Telephone Encounter (Signed)
Patient returned phone call. °

## 2016-12-12 ENCOUNTER — Other Ambulatory Visit: Payer: Self-pay | Admitting: Internal Medicine

## 2016-12-14 ENCOUNTER — Ambulatory Visit (INDEPENDENT_AMBULATORY_CARE_PROVIDER_SITE_OTHER): Payer: Medicare HMO | Admitting: *Deleted

## 2016-12-14 DIAGNOSIS — Z7901 Long term (current) use of anticoagulants: Secondary | ICD-10-CM | POA: Diagnosis not present

## 2016-12-14 DIAGNOSIS — Z5181 Encounter for therapeutic drug level monitoring: Secondary | ICD-10-CM

## 2016-12-14 DIAGNOSIS — Z952 Presence of prosthetic heart valve: Secondary | ICD-10-CM | POA: Diagnosis not present

## 2016-12-14 DIAGNOSIS — I359 Nonrheumatic aortic valve disorder, unspecified: Secondary | ICD-10-CM

## 2016-12-14 LAB — POCT INR: INR: 2.4

## 2016-12-15 ENCOUNTER — Other Ambulatory Visit: Payer: Self-pay | Admitting: Internal Medicine

## 2017-01-12 ENCOUNTER — Encounter (HOSPITAL_COMMUNITY): Payer: Self-pay | Admitting: Emergency Medicine

## 2017-01-12 ENCOUNTER — Emergency Department (HOSPITAL_COMMUNITY): Payer: Medicare HMO

## 2017-01-12 ENCOUNTER — Emergency Department (HOSPITAL_COMMUNITY)
Admission: EM | Admit: 2017-01-12 | Discharge: 2017-01-12 | Disposition: A | Discharge status: Home / Self Care | Payer: Medicare HMO | Attending: Emergency Medicine | Admitting: Emergency Medicine

## 2017-01-12 DIAGNOSIS — J45909 Unspecified asthma, uncomplicated: Secondary | ICD-10-CM | POA: Diagnosis not present

## 2017-01-12 DIAGNOSIS — Z87891 Personal history of nicotine dependence: Secondary | ICD-10-CM | POA: Diagnosis not present

## 2017-01-12 DIAGNOSIS — Z7984 Long term (current) use of oral hypoglycemic drugs: Secondary | ICD-10-CM | POA: Insufficient documentation

## 2017-01-12 DIAGNOSIS — E119 Type 2 diabetes mellitus without complications: Secondary | ICD-10-CM | POA: Insufficient documentation

## 2017-01-12 DIAGNOSIS — Z79899 Other long term (current) drug therapy: Secondary | ICD-10-CM | POA: Diagnosis not present

## 2017-01-12 DIAGNOSIS — W19XXXA Unspecified fall, initial encounter: Secondary | ICD-10-CM

## 2017-01-12 DIAGNOSIS — I10 Essential (primary) hypertension: Secondary | ICD-10-CM | POA: Insufficient documentation

## 2017-01-12 DIAGNOSIS — M25561 Pain in right knee: Secondary | ICD-10-CM | POA: Insufficient documentation

## 2017-01-12 MED ORDER — ACETAMINOPHEN 500 MG PO TABS
1000.0000 mg | ORAL_TABLET | Freq: Once | ORAL | Status: DC
  Filled 2017-01-12: qty 2

## 2017-01-12 MED ORDER — OXYCODONE HCL 5 MG PO TABS: 2.5000 mg | ORAL_TABLET | ORAL | 0 refills | Status: DC | PRN

## 2017-01-12 MED ORDER — OXYCODONE HCL 5 MG PO TABS
5.0000 mg | ORAL_TABLET | Freq: Once | ORAL | Status: DC
  Filled 2017-01-12: qty 1

## 2017-01-12 NOTE — ED Notes (Signed)
Ortho tech paged for knee immobilizer 

## 2017-01-12 NOTE — Progress Notes (Signed)
Orthopedic Tech Progress Note Patient Details:  [name] 04-19-1946 [id number]  Ortho Devices Type of Ortho Device: Knee Immobilizer Ortho Device/Splint Interventions: Application   [place] Pink 01/12/2017, 1:47 PM

## 2017-01-12 NOTE — ED Triage Notes (Signed)
Patient from home complaining of right knee pain.  States he was trying to step over some logs on the ground when his right knee "gave out" and he fell, caught himself with his right hand.  Laceration to right hand.  Patient states he has been told before that he needs a knee replacement.  Patient alert and oriented at this time.  Took 50mg  tramadol PTA

## 2017-01-12 NOTE — Discharge Instructions (Signed)
Take tylenol 1000mg(2 extra strength) four times a day.  ° °Then take the pain medicine if you feel like you need it. Narcotics do not help with the pain, they only make you care about it less.  You can become addicted to this, people may break into your house to steal it.  It will constipate you.  If you drive under the influence of this medicine you can get a DUI.   ° °

## 2017-01-12 NOTE — ED Provider Notes (Signed)
[place] DEPT Provider Note   CSN: [id number] Arrival date & time: 01/12/17  1215     History   Chief Complaint Chief Complaint  Patient presents with  . Fall    HPI [name] is a 71 y.o. male.  71 yo M with a cc of a fall.  Patient was stepping over some wood when he had a sharp pain in his knee.  This caused him so much pain that he lost his balance and fell.  Able to walk home afterwards, took a nap and then felt that his knee was significantly swollen and then had trouble walking on it.   Small lac to the palm of his hand and abrasion to R knee.  TDap up to date.  Denies head injury, loc, chest pain, abdominal pain, back pain.      The history is provided by the patient.  Injury  This is a new problem. The current episode started 3 to 5 hours ago. The problem occurs constantly. The problem has been gradually worsening. Pertinent negatives include no chest pain, no abdominal pain, no headaches and no shortness of breath. The symptoms are aggravated by bending and twisting. The symptoms are relieved by rest. The treatment provided no relief.    Past Medical History:  Diagnosis Date  . Acute renal failure ([place]) 02/09/2013  . Aortic stenosis 08/25/2010   a. Bentall aortic root replacement with a St. Jude mechanical valve and Hemashield conduit 02/2004.  Marland Kitchen Arthritis   . Asthma   . AV BLOCK, COMPLETE    a. s/p St Jude dual chamber pacemaker 02/2004.  Marland Kitchen CAD (coronary artery disease)    a. s/p CABGx2 (LIMA-dLAD, SVG-Cx). b. Low risk nuc 12/2012 without ischemia, EF 46% mild apical hypokinesia (EF 55% inf HK by echo).  . Carotid artery disease ([place])    a. 0-39% bilateral ICA stenosis, stable mild hard plaque in carotid bulbs. F/u 03/2014 recommended.  . Diabetes mellitus without complication ([place])   . DIVERTICULOSIS, COLON 10/17/2007  . Ejection fraction    a. EF 55% with inf HK, mild MR by echo 12/2012.  Marland Kitchen GERD (gastroesophageal reflux disease)    hxof   . GOUT 01/16/2007   . HEMORRHOIDS, INTERNAL 11/01/2008  . HYPERLIPIDEMIA 01/16/2007  . HYPERTENSION 01/16/2007  . Impaired glucose tolerance   . LEG EDEMA 11/27/2008  . Special screening for malignant neoplasm of prostate   . Warfarin anticoagulation    AVR    Patient Active Problem List   Diagnosis Date Noted  . 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 Diabetes mellitus without complication ([place]) 87/56/4332  . Encounter for therapeutic drug monitoring 08/08/2013  . Aortic valve replaced 02/21/2013  . Mitral regurgitation 01/21/2013  . Recurrent right inguinal hernia 01/02/2013  . Left inguinal hernia 01/02/2013  . Carotid artery disease ([place])   . Warfarin anticoagulation   . Hx of CABG   . Aortic stenosis 08/25/2010  . AORTIC VALVE REPLACEMENT, HX OF 05/23/2009  . AV BLOCK, COMPLETE 11/01/2008  . PACEMAKER, PERMANENT 11/01/2008  . GOUT 01/16/2007  . CAD (coronary artery disease) 01/16/2007  . Dyslipidemia 01/16/2007  . Essential hypertension 01/16/2007    Past Surgical History:  Procedure Laterality Date  . AORTIC VALVE REPLACEMENT    . CARDIAC CATHETERIZATION  06/2003  . CIRCUMCISION    . COLONOSCOPY    . CORONARY ARTERY BYPASS GRAFT    . coronary stents      prior to  bypass   . EP IMPLANTABLE DEVICE N/A 05/06/2016   Procedure: PPM Generator Changeout;  Surgeon: Evans Lance, MD;  Location: [facility];  Service: Cardiovascular;  Laterality: N/A;  . ESOPHAGOGASTRODUODENOSCOPY    . HERNIA REPAIR    . INGUINAL HERNIA REPAIR Bilateral 02/07/2013   Procedure: LAPAROSCOPIC BILATERAL INGUINAL HERNIA REPAIR;  Surgeon: Imogene Burn. Georgette Dover, MD;  Location: [facility];  Service: General;  Laterality: Bilateral;  . INSERTION OF MESH Bilateral 02/07/2013   Procedure: INSERTION OF MESH;  Surgeon: Imogene Burn. Georgette Dover, MD;  Location: [facility];  Service: General;  Laterality: Bilateral;  . PACEMAKER INSERTION    . SHOULDER SURGERY Bilateral        Home  Medications    Prior to Admission medications   Medication Sig Start Date End Date Taking? Authorizing Provider  ACCU-CHEK FASTCLIX LANCETS MISC USE TO CHECK BLOOD SUGAR TWICE A DAY AND AS NEEDED 10/29/16   Marletta Lor, MD  acetaminophen (TYLENOL ARTHRITIS PAIN) 650 MG CR tablet Take 1,300 mg by mouth 2 (two) times daily.    [provider]  albuterol (PROVENTIL HFA;VENTOLIN HFA) 108 (90 Base) MCG/ACT inhaler Inhale 2 puffs into the lungs every 6 (six) hours as needed for wheezing or shortness of breath. 06/10/16   Marletta Lor, MD  Ascorbic Acid (VITAMIN C) 500 MG tablet Take 1,000 mg by mouth daily.     [provider]  aspirin 81 MG tablet Take 81 mg by mouth every evening.     [provider]  atorvastatin (LIPITOR) 40 MG tablet TAKE 1 TABLET EVERY DAY  AT  6 P.M. 12/13/16   Evans Lance, MD  calcium carbonate (OS-CAL) 600 MG TABS Take 600 mg by mouth 2 (two) times daily with a meal.    [provider]  cetirizine (ZYRTEC) 10 MG tablet Take 10 mg by mouth daily.     [provider]  furosemide (LASIX) 40 MG tablet TAKE 1 TABLET TWICE DAILY 12/15/16   Marletta Lor, MD  Glucosamine-Chondroitin 250-200 MG CAPS Take 2 capsules by mouth daily.     [provider]  glucose blood (ACCU-CHEK SMARTVIEW) test strip USE TO CHECK BLOOD SUGAR TWICE A DAY AND PRN 04/30/15   Marletta Lor, MD  guaiFENesin-dextromethorphan (ROBITUSSIN DM) 100-10 MG/5ML syrup Take 5 mLs by mouth every 4 (four) hours as needed for cough.    [provider]  losartan (COZAAR) 100 MG tablet Take 100 mg by mouth every evening.    [provider]  metFORMIN (GLUCOPHAGE) 1000 MG tablet TAKE 1 TABLET (1,000 MG TOTAL) BY MOUTH 2 (TWO) TIMES DAILY WITH A MEAL. 04/19/16   Marletta Lor, MD  metoprolol succinate (TOPROL-XL) 100 MG 24 hr tablet TAKE 1 TABLET EVERY MORNING WITH OR IMMEDIATELY FOLLOWING A MEAL 12/16/16   Marletta Lor, MD  Misc Natural Products [facility] ADVANCED) CAPS Take 2 capsules by mouth daily.     [provider]  Multiple Vitamin (MULTIVITAMIN) tablet Take 1 tablet by mouth daily.      [provider]  nitroGLYCERIN (NITROSTAT) 0.4 MG SL tablet Place 1 tablet (0.4 mg total) under the tongue every 5 (five) minutes as needed. For chest pain 11/02/16   Eileen Stanford, PA-C  Omega-3 Fatty Acids (FISH OIL) 1200 MG CAPS Take 1,200 mg by mouth 2 (two) times daily.    [provider]  oxyCODONE (ROXICODONE) 5 MG immediate release tablet Take 0.5 tablets (  2.5 mg total) by mouth every 4 (four) hours as needed for severe pain. 01/12/17   Deno Etienne, DO  potassium chloride (K-DUR) 10 MEQ tablet Take 10 mEq by mouth daily.     [provider]  Probiotic Product ([place]) Take 1 capsule by mouth daily.    [provider]  traMADol (ULTRAM) 50 MG tablet Take 50 mg by mouth every 6 (six) hours as needed for pain.  07/26/12   [provider]  vitamin B-12 (CYANOCOBALAMIN) 1000 MCG tablet Take 1,000 mcg by mouth daily.    [provider]  warfarin (COUMADIN) 10 MG tablet TAKE AS DIRECTED BY COUMADIN CLINIC 12/16/16   Marletta Lor, MD    Family History Family History  Problem Relation Age of Onset  . Heart attack Father   . Kidney cancer Father   . Hypertension Mother   . Diabetes Mother   . Diabetes Brother   . Epilepsy Sister   . Diabetes Sister   . Diabetes Brother     Social History Social History  Substance Use Topics  . Smoking status: Former Smoker    Quit date: 07/12/1973  . Smokeless tobacco: Never Used  . Alcohol use No     Allergies   Bee venom; Codeine phosphate; Metoprolol tartrate; Poison ivy extract; Poison oak extract [poison oak extract]; and Ramipril   Review of Systems Review of Systems  Constitutional: Negative for chills and fever.  HENT: Negative for congestion and facial swelling.    Eyes: Negative for discharge and visual disturbance.  Respiratory: Negative for shortness of breath.   Cardiovascular: Negative for chest pain and palpitations.  Gastrointestinal: Negative for abdominal pain, diarrhea and vomiting.  Musculoskeletal: Positive for arthralgias and myalgias.  Skin: Positive for wound. Negative for color change and rash.  Neurological: Negative for tremors, syncope and headaches.  Psychiatric/Behavioral: Negative for confusion and dysphoric mood.     Physical Exam Updated Vital Signs BP 130/69   Pulse 66   Temp 97.9 F (36.6 C) (Oral)   Resp 16   SpO2 94%   Physical Exam  Constitutional: He is oriented to person, place, and time. He appears well-developed and well-nourished.  HENT:  Head: Normocephalic and atraumatic.  Eyes: EOM are normal. Pupils are equal, round, and reactive to light.  Neck: Normal range of motion. Neck supple. No JVD present.  Cardiovascular: Normal rate and regular rhythm.  Exam reveals no gallop and no friction rub.   No murmur heard. Pulmonary/Chest: No respiratory distress. He has no wheezes.  Abdominal: He exhibits no distension. There is no rebound and no guarding.  Musculoskeletal: Normal range of motion. He exhibits edema and tenderness (right knee effusion, pain worst to lateral joint space.  PMS intact distally).  Superficial lac to the palm of the right hand  Neurological: He is alert and oriented to person, place, and time.  Skin: No rash noted. No pallor.  Psychiatric: He has a normal mood and affect. His behavior is normal.  Nursing note and vitals reviewed.    ED Treatments / Results  Labs (all labs ordered are listed, but only abnormal results are displayed) Labs Reviewed - No data to display  EKG  EKG Interpretation None       Radiology Dg Knee Complete 4 Views Right  Result Date: 01/12/2017 CLINICAL DATA:  Anterolateral right knee pain EXAM: RIGHT KNEE - COMPLETE 4+ VIEW COMPARISON:  None.  FINDINGS: There is a moderate suprapatellar joint effusion with  tricompartmental osteoarthritic joint space narrowing and spurring of the right knee. No fracture, dislocations nor suspicious osseous lesions. Vascular calcifications are present along the course of the included femoral through tibial arteries. IMPRESSION: Tricompartmental osteoarthritis of the knee with moderate suprapatellar joint effusion. Electronically Signed   By: Ashley Royalty M.D.   On: 01/12/2017 13:26    Procedures Procedures (including critical care time)  Medications Ordered in ED Medications  acetaminophen (TYLENOL) tablet 1,000 mg (1,000 mg Oral Refused 01/12/17 1408)  oxyCODONE (Oxy IR/ROXICODONE) immediate release tablet 5 mg (5 mg Oral Refused 01/12/17 1408)     Initial Impression / Assessment and Plan / ED Course  I have reviewed the triage vital signs and the nursing notes.  Pertinent labs & imaging results that were available during my care of the patient were reviewed by me and considered in my medical decision making (see chart for details).     71 yo M with a cc of a mechanical fall.  Effusion to right knee with some tenderness. Xray negative, able to ambulate, limited ligamentous exam with pain and swelling.  Ortho follow up.   3:16 PM:  I have discussed the diagnosis/risks/treatment options with the patient and family and believe the pt to be eligible for discharge home to follow-up with PCP. We also discussed returning to the ED immediately if new or worsening sx occur. We discussed the sx which are most concerning (e.g., sudden worsening pain, fever, inability to tolerate by mouth) that necessitate immediate return. Medications administered to the patient during their visit and any new prescriptions provided to the patient are listed below.  Medications given during this visit Medications  acetaminophen (TYLENOL) tablet 1,000 mg (1,000 mg Oral Refused 01/12/17 1408)  oxyCODONE (Oxy IR/ROXICODONE) immediate  release tablet 5 mg (5 mg Oral Refused 01/12/17 1408)     The patient appears reasonably screen and/or stabilized for discharge and I doubt any other medical condition or other [facility] requiring further screening, evaluation, or treatment in the ED at this time prior to discharge.    Final Clinical Impressions(s) / ED Diagnoses   Final diagnoses:  Fall, initial encounter  Acute pain of right knee    New Prescriptions Discharge Medication List as of 01/12/2017  1:36 PM    START taking these medications   Details  oxyCODONE (ROXICODONE) 5 MG immediate release tablet Take 0.5 tablets (2.5 mg total) by mouth every 4 (four) hours as needed for severe pain., Starting Wed 01/12/2017, [place], Linna Hoff, DO 01/12/17 [phone]

## 2017-01-14 DIAGNOSIS — M1711 Unilateral primary osteoarthritis, right knee: Secondary | ICD-10-CM | POA: Diagnosis not present

## 2017-01-14 DIAGNOSIS — M25561 Pain in right knee: Secondary | ICD-10-CM | POA: Diagnosis not present

## 2017-01-25 ENCOUNTER — Ambulatory Visit (INDEPENDENT_AMBULATORY_CARE_PROVIDER_SITE_OTHER): Payer: Medicare HMO | Admitting: *Deleted

## 2017-01-25 DIAGNOSIS — Z952 Presence of prosthetic heart valve: Secondary | ICD-10-CM

## 2017-01-25 DIAGNOSIS — Z7901 Long term (current) use of anticoagulants: Secondary | ICD-10-CM | POA: Diagnosis not present

## 2017-01-25 DIAGNOSIS — Z5181 Encounter for therapeutic drug level monitoring: Secondary | ICD-10-CM | POA: Diagnosis not present

## 2017-01-25 DIAGNOSIS — I359 Nonrheumatic aortic valve disorder, unspecified: Secondary | ICD-10-CM

## 2017-01-25 LAB — POCT INR: INR: 2.5

## 2017-01-28 DIAGNOSIS — M25561 Pain in right knee: Secondary | ICD-10-CM | POA: Diagnosis not present

## 2017-01-28 DIAGNOSIS — M1711 Unilateral primary osteoarthritis, right knee: Secondary | ICD-10-CM | POA: Diagnosis not present

## 2017-02-08 ENCOUNTER — Encounter: Payer: Medicare HMO | Admitting: Internal Medicine

## 2017-02-11 DIAGNOSIS — M25561 Pain in right knee: Secondary | ICD-10-CM | POA: Diagnosis not present

## 2017-02-11 DIAGNOSIS — M1711 Unilateral primary osteoarthritis, right knee: Secondary | ICD-10-CM | POA: Diagnosis not present

## 2017-02-14 ENCOUNTER — Ambulatory Visit (INDEPENDENT_AMBULATORY_CARE_PROVIDER_SITE_OTHER): Payer: Medicare HMO | Admitting: *Deleted

## 2017-02-14 DIAGNOSIS — I442 Atrioventricular block, complete: Secondary | ICD-10-CM

## 2017-02-15 NOTE — Progress Notes (Signed)
Remote pacemaker transmission.   

## 2017-02-16 ENCOUNTER — Encounter: Payer: Self-pay | Admitting: Cardiology

## 2017-02-18 LAB — CUP PACEART REMOTE DEVICE CHECK
Battery Remaining Longevity: 124 mo
Brady Statistic AS VP Percent: 74 %
Brady Statistic RA Percent Paced: 25 %
Brady Statistic RV Percent Paced: 99 %
Date Time Interrogation Session: 20180806174908
Implantable Lead Implant Date: 20050816
Implantable Lead Location: 753859
Lead Channel Impedance Value: 700 Ohm
Lead Channel Pacing Threshold Amplitude: 1 V
Lead Channel Pacing Threshold Pulse Width: 0.5 ms
Lead Channel Setting Pacing Amplitude: 2 V
Lead Channel Setting Sensing Sensitivity: 4 mV
MDC IDC LEAD IMPLANT DT: 20050816
MDC IDC LEAD LOCATION: 753860
MDC IDC MSMT BATTERY REMAINING PERCENTAGE: 95.5 %
MDC IDC MSMT BATTERY VOLTAGE: 3.01 V
MDC IDC MSMT LEADCHNL RA IMPEDANCE VALUE: 480 Ohm
MDC IDC MSMT LEADCHNL RA SENSING INTR AMPL: 2.4 mV
MDC IDC MSMT LEADCHNL RV PACING THRESHOLD AMPLITUDE: 0.5 V
MDC IDC MSMT LEADCHNL RV PACING THRESHOLD PULSEWIDTH: 0.5 ms
MDC IDC PG IMPLANT DT: 20171026
MDC IDC SET LEADCHNL RV PACING AMPLITUDE: 0.75 V
MDC IDC SET LEADCHNL RV PACING PULSEWIDTH: 0.5 ms
MDC IDC STAT BRADY AP VP PERCENT: 26 %
MDC IDC STAT BRADY AP VS PERCENT: 1 %
MDC IDC STAT BRADY AS VS PERCENT: 1 %
Pulse Gen Model: 2272
Pulse Gen Serial Number: 3182792

## 2017-03-08 ENCOUNTER — Ambulatory Visit (INDEPENDENT_AMBULATORY_CARE_PROVIDER_SITE_OTHER): Payer: Medicare HMO | Admitting: *Deleted

## 2017-03-08 DIAGNOSIS — Z952 Presence of prosthetic heart valve: Secondary | ICD-10-CM

## 2017-03-08 DIAGNOSIS — Z5181 Encounter for therapeutic drug level monitoring: Secondary | ICD-10-CM

## 2017-03-08 DIAGNOSIS — Z7901 Long term (current) use of anticoagulants: Secondary | ICD-10-CM

## 2017-03-08 DIAGNOSIS — I359 Nonrheumatic aortic valve disorder, unspecified: Secondary | ICD-10-CM | POA: Diagnosis not present

## 2017-03-08 LAB — POCT INR: INR: 3

## 2017-03-27 ENCOUNTER — Other Ambulatory Visit: Payer: Self-pay | Admitting: Internal Medicine

## 2017-03-31 ENCOUNTER — Encounter: Payer: Self-pay | Admitting: Internal Medicine

## 2017-04-12 ENCOUNTER — Ambulatory Visit (INDEPENDENT_AMBULATORY_CARE_PROVIDER_SITE_OTHER): Payer: Medicare HMO | Admitting: Internal Medicine

## 2017-04-12 ENCOUNTER — Encounter: Payer: Self-pay | Admitting: Internal Medicine

## 2017-04-12 VITALS — BP 130/68 | HR 74 | Temp 97.8°F | Ht 66.5 in | Wt 229.2 lb

## 2017-04-12 DIAGNOSIS — Z23 Encounter for immunization: Secondary | ICD-10-CM

## 2017-04-12 DIAGNOSIS — I350 Nonrheumatic aortic (valve) stenosis: Secondary | ICD-10-CM | POA: Diagnosis not present

## 2017-04-12 DIAGNOSIS — I2510 Atherosclerotic heart disease of native coronary artery without angina pectoris: Secondary | ICD-10-CM | POA: Diagnosis not present

## 2017-04-12 DIAGNOSIS — I10 Essential (primary) hypertension: Secondary | ICD-10-CM | POA: Diagnosis not present

## 2017-04-12 DIAGNOSIS — E119 Type 2 diabetes mellitus without complications: Secondary | ICD-10-CM | POA: Diagnosis not present

## 2017-04-12 LAB — POCT GLYCOSYLATED HEMOGLOBIN (HGB A1C): Hemoglobin A1C: 5.6

## 2017-04-12 NOTE — Patient Instructions (Signed)
Limit your sodium (Salt) intake   Please check your hemoglobin A1c every 6  Months  You need to lose weight.  Consider a lower calorie diet and regular exercise.    It is important that you exercise regularly, at least 20 minutes 3 to 4 times per week.  If you develop chest pain or shortness of breath seek  medical attention.  Return in 6 months for follow-up

## 2017-04-12 NOTE — Progress Notes (Signed)
Subjective:    Patient ID: [name], male    DOB: 1946/05/01, 71 y.o.   MRN: [id number]  HPI  Lab Results  Component Value Date   HGBA1C 6.3 10/11/2016   71 year old patient who is seen today for follow-up.  He has a history of hypertension, coronary artery disease.  He is status post aortic valve repair and remains on Coumadin anticoagulation. He has been followed by orthopedics recently due to traumatic right knee pain.  Otherwise, doing well.  He did have a examination performed in January of this year No cardiopulmonary complaints, although has been fairly inactive due to right knee discomfort  Flu vaccine administered  Past Medical History:  Diagnosis Date  . Acute renal failure ([place]) 02/09/2013  . Aortic stenosis 08/25/2010   a. Bentall aortic root replacement with a St. Jude mechanical valve and Hemashield conduit 02/2004.  Marland Kitchen Arthritis   . Asthma   . AV BLOCK, COMPLETE    a. s/p St Jude dual chamber pacemaker 02/2004.  Marland Kitchen CAD (coronary artery disease)    a. s/p CABGx2 (LIMA-dLAD, SVG-Cx). b. Low risk nuc 12/2012 without ischemia, EF 46% mild apical hypokinesia (EF 55% inf HK by echo).  . Carotid artery disease ([place])    a. 0-39% bilateral ICA stenosis, stable mild hard plaque in carotid bulbs. F/u 03/2014 recommended.  . Diabetes mellitus without complication ([place])   . DIVERTICULOSIS, COLON 10/17/2007  . Ejection fraction    a. EF 55% with inf HK, mild MR by echo 12/2012.  Marland Kitchen GERD (gastroesophageal reflux disease)    hxof   . GOUT 01/16/2007  . HEMORRHOIDS, INTERNAL 11/01/2008  . HYPERLIPIDEMIA 01/16/2007  . HYPERTENSION 01/16/2007  . Impaired glucose tolerance   . LEG EDEMA 11/27/2008  . Special screening for malignant neoplasm of prostate   . Warfarin anticoagulation    AVR     Social History   Social History  . Marital status: Married    Spouse name: N/A  . Number of children: 3  . Years of education: N/A   Occupational History  . retired    Social History Main  Topics  . Smoking status: Former Smoker    Quit date: 07/12/1973  . Smokeless tobacco: Never Used  . Alcohol use No  . Drug use: No  . Sexual activity: No   Other Topics Concern  . Not on file   Social History Narrative  . No narrative on file    Past Surgical History:  Procedure Laterality Date  . AORTIC VALVE REPLACEMENT    . CARDIAC CATHETERIZATION  06/2003  . CIRCUMCISION    . COLONOSCOPY    . CORONARY ARTERY BYPASS GRAFT    . coronary stents      prior to bypass   . EP IMPLANTABLE DEVICE N/A 05/06/2016   Procedure: PPM Generator Changeout;  Surgeon: Evans Lance, MD;  Location: [facility];  Service: Cardiovascular;  Laterality: N/A;  . ESOPHAGOGASTRODUODENOSCOPY    . HERNIA REPAIR    . INGUINAL HERNIA REPAIR Bilateral 02/07/2013   Procedure: LAPAROSCOPIC BILATERAL INGUINAL HERNIA REPAIR;  Surgeon: Imogene Burn. Georgette Dover, MD;  Location: [facility];  Service: General;  Laterality: Bilateral;  . INSERTION OF MESH Bilateral 02/07/2013   Procedure: INSERTION OF MESH;  Surgeon: Imogene Burn. Georgette Dover, MD;  Location: [facility];  Service: General;  Laterality: Bilateral;  . PACEMAKER INSERTION    . SHOULDER SURGERY Bilateral     Family History  Problem Relation Age of Onset  .  Heart attack Father   . Kidney cancer Father   . Hypertension Mother   . Diabetes Mother   . Diabetes Brother   . Epilepsy Sister   . Diabetes Sister   . Diabetes Brother     Allergies  Allergen Reactions  . Bee Venom Shortness Of Breath and Swelling  . Codeine Phosphate Hives, Shortness Of Breath, Itching, Rash and Other (See Comments)    REACTION: difficulty breathing  . Metoprolol Tartrate Other (See Comments)    REACTION: loss of vision. Can take the XL tablet not regular   . Poison Ivy Extract     Severe rash and hives all over body  . Poison Oak Extract [Poison Oak Extract]     Severe rash and hives all over body  . Ramipril Rash and Cough    Current Outpatient Prescriptions on File Prior to  Visit  Medication Sig Dispense Refill  . ACCU-CHEK FASTCLIX LANCETS MISC USE TO CHECK BLOOD SUGAR TWICE A DAY AND AS NEEDED 306 each 3  . acetaminophen (TYLENOL ARTHRITIS PAIN) 650 MG CR tablet Take 1,300 mg by mouth 2 (two) times daily.    Marland Kitchen albuterol (PROVENTIL HFA;VENTOLIN HFA) 108 (90 Base) MCG/ACT inhaler Inhale 2 puffs into the lungs every 6 (six) hours as needed for wheezing or shortness of breath. 3 Inhaler 3  . Ascorbic Acid (VITAMIN C) 500 MG tablet Take 1,000 mg by mouth daily.     Marland Kitchen aspirin 81 MG tablet Take 81 mg by mouth every evening.     Marland Kitchen atorvastatin (LIPITOR) 40 MG tablet TAKE 1 TABLET EVERY DAY  AT  6 P.M. 90 tablet 2  . calcium carbonate (OS-CAL) 600 MG TABS Take 600 mg by mouth 2 (two) times daily with a meal.    . cetirizine (ZYRTEC) 10 MG tablet Take 10 mg by mouth daily.     . furosemide (LASIX) 40 MG tablet TAKE 1 TABLET TWICE DAILY 180 tablet 1  . Glucosamine-Chondroitin 250-200 MG CAPS Take 2 capsules by mouth daily.     Marland Kitchen glucose blood (ACCU-CHEK SMARTVIEW) test strip USE TO CHECK BLOOD SUGAR TWICE A DAY AND PRN 300 each 3  . guaiFENesin-dextromethorphan (ROBITUSSIN DM) 100-10 MG/5ML syrup Take 5 mLs by mouth every 4 (four) hours as needed for cough.    . losartan (COZAAR) 100 MG tablet TAKE 1 TABLET (100 MG TOTAL) BY MOUTH DAILY. 90 tablet 1  . metFORMIN (GLUCOPHAGE) 1000 MG tablet TAKE 1 TABLET TWICE DAILY WITH MEALS 180 tablet 3  . metoprolol succinate (TOPROL-XL) 100 MG 24 hr tablet TAKE 1 TABLET EVERY MORNING WITH OR IMMEDIATELY FOLLOWING A MEAL 90 tablet 1  . Misc Natural Products ([employer] [place] ADVANCED) CAPS Take 2 capsules by mouth daily.     . Multiple Vitamin (MULTIVITAMIN) tablet Take 1 tablet by mouth daily.      . nitroGLYCERIN (NITROSTAT) 0.4 MG SL tablet Place 1 tablet (0.4 mg total) under the tongue every 5 (five) minutes as needed. For chest pain 25 tablet 3  . Omega-3 Fatty Acids (FISH OIL) 1200 MG CAPS Take 1,200 mg by mouth 2 (two) times daily.      Marland Kitchen oxyCODONE (ROXICODONE) 5 MG immediate release tablet Take 0.5 tablets (2.5 mg total) by mouth every 4 (four) hours as needed for severe pain. 6 tablet 0  . potassium chloride (K-DUR) 10 MEQ tablet Take 10 mEq by mouth daily.     . potassium chloride (K-DUR,KLOR-CON) 10 MEQ tablet TAKE 1 TABLET TWICE DAILY  180 tablet 3  . Probiotic Product ([employer] [facility] PO) Take 1 capsule by mouth daily.    . traMADol (ULTRAM) 50 MG tablet Take 50 mg by mouth every 6 (six) hours as needed for pain.     . vitamin B-12 (CYANOCOBALAMIN) 1000 MCG tablet Take 1,000 mcg by mouth daily.    Marland Kitchen warfarin (COUMADIN) 10 MG tablet TAKE AS DIRECTED BY COUMADIN CLINIC 90 tablet 1   No current facility-administered medications on file prior to visit.     BP 130/68 (BP Location: Left Arm, Patient Position: Sitting, Cuff Size: Normal)   Pulse 74   Temp 97.8 F (36.6 C) (Oral)   Ht 5' 6.5" (1.689 m)   Wt 229 lb 3.2 oz (104 kg)   SpO2 96%   BMI 36.44 kg/m     Review of Systems  Constitutional: Negative for appetite change, chills, fatigue and fever.  HENT: Negative for congestion, dental problem, ear pain, hearing loss, sore throat, tinnitus, trouble swallowing and voice change.   Eyes: Negative for pain, discharge and visual disturbance.  Respiratory: Negative for cough, chest tightness, wheezing and stridor.   Cardiovascular: Negative for chest pain, palpitations and leg swelling.  Gastrointestinal: Negative for abdominal distention, abdominal pain, blood in stool, constipation, diarrhea, nausea and vomiting.  Genitourinary: Negative for difficulty urinating, discharge, flank pain, genital sores, hematuria and urgency.  Musculoskeletal: Positive for arthralgias. Negative for back pain, gait problem, joint swelling, myalgias and neck stiffness.  Skin: Negative for rash.  Neurological: Negative for dizziness, syncope, speech difficulty, weakness, numbness and headaches.  Hematological: Negative for  adenopathy. Does not bruise/bleed easily.  Psychiatric/Behavioral: Negative for behavioral problems and dysphoric mood. The patient is not nervous/anxious.        Objective:   Physical Exam  Constitutional: He is oriented to person, place, and time. He appears well-developed.  Weight 229 Blood pressure 120/70  HENT:  Head: Normocephalic.  Right Ear: External ear normal.  Left Ear: External ear normal.  Mouth/Throat: Oropharynx is clear and moist.  Eyes: Conjunctivae and EOM are normal.  Neck: Normal range of motion.  Cardiovascular: Normal rate.   Rhythm is regular.  Prosthetic heart sounds.  No murmur  Pulmonary/Chest: Breath sounds normal.  Abdominal: Bowel sounds are normal.  Musculoskeletal: Normal range of motion. He exhibits no edema or tenderness.  Right knee slightly swollen and warm to touch  Neurological: He is alert and oriented to person, place, and time.  Psychiatric: He has a normal mood and affect. His behavior is normal.          Assessment & Plan:   Status post aortic valve repair.  Continue chronic Coumadin anticoagulation Essential hypertension, stable Obesity.  Weight loss encouraged Type 2 diabetes.  Continue metformin  Lab Results  Component Value Date   HGBA1C 5.6 04/12/2017    CPX 6 months  [name] Pilar Plate

## 2017-04-16 DIAGNOSIS — M25561 Pain in right knee: Secondary | ICD-10-CM | POA: Diagnosis not present

## 2017-04-16 DIAGNOSIS — M170 Bilateral primary osteoarthritis of knee: Secondary | ICD-10-CM | POA: Diagnosis not present

## 2017-04-16 DIAGNOSIS — M1712 Unilateral primary osteoarthritis, left knee: Secondary | ICD-10-CM | POA: Diagnosis not present

## 2017-04-16 DIAGNOSIS — M1711 Unilateral primary osteoarthritis, right knee: Secondary | ICD-10-CM | POA: Diagnosis not present

## 2017-04-19 ENCOUNTER — Ambulatory Visit (INDEPENDENT_AMBULATORY_CARE_PROVIDER_SITE_OTHER): Payer: Medicare HMO | Admitting: *Deleted

## 2017-04-19 DIAGNOSIS — Z5181 Encounter for therapeutic drug level monitoring: Secondary | ICD-10-CM

## 2017-04-19 DIAGNOSIS — I359 Nonrheumatic aortic valve disorder, unspecified: Secondary | ICD-10-CM | POA: Diagnosis not present

## 2017-04-19 DIAGNOSIS — Z952 Presence of prosthetic heart valve: Secondary | ICD-10-CM

## 2017-04-19 DIAGNOSIS — Z7901 Long term (current) use of anticoagulants: Secondary | ICD-10-CM

## 2017-04-19 LAB — POCT INR: INR: 2.3

## 2017-05-11 ENCOUNTER — Encounter: Payer: Self-pay | Admitting: Internal Medicine

## 2017-05-11 ENCOUNTER — Ambulatory Visit (INDEPENDENT_AMBULATORY_CARE_PROVIDER_SITE_OTHER): Payer: Medicare HMO | Admitting: Internal Medicine

## 2017-05-11 VITALS — BP 142/80 | HR 76 | Ht 66.5 in | Wt 227.0 lb

## 2017-05-11 DIAGNOSIS — I10 Essential (primary) hypertension: Secondary | ICD-10-CM

## 2017-05-11 DIAGNOSIS — Z951 Presence of aortocoronary bypass graft: Secondary | ICD-10-CM

## 2017-05-11 DIAGNOSIS — Z950 Presence of cardiac pacemaker: Secondary | ICD-10-CM

## 2017-05-11 DIAGNOSIS — I442 Atrioventricular block, complete: Secondary | ICD-10-CM

## 2017-05-11 NOTE — Progress Notes (Signed)
HPI Mr. Cumpton returns today for ongoing evaluation and management of complete heart block after aortic root and aortic valve replacement, status post pacemaker insertion. In the interim, he has lost 5 pounds. His diabetes has been well-controlled. He remains active walking his dog. He denies chest pain or shortness of breath. Minimal peripheral edema. He does have chronic knee pain and is considering knee replacement surgery. Allergies  Allergen Reactions  . Bee Venom Shortness Of Breath and Swelling  . Codeine Phosphate Hives, Shortness Of Breath, Itching, Rash and Other (See Comments)    REACTION: difficulty breathing  . Metoprolol Tartrate Other (See Comments)    REACTION: loss of vision. Can take the XL tablet not regular   . Poison Ivy Extract     Severe rash and hives all over body  . Poison Oak Extract     Severe rash and hives all over body  . Ramipril Rash and Cough     Current Outpatient Prescriptions  Medication Sig Dispense Refill  . acetaminophen (TYLENOL ARTHRITIS PAIN) 650 MG CR tablet Take 1,300 mg by mouth 2 (two) times daily.    Marland Kitchen albuterol (PROVENTIL HFA;VENTOLIN HFA) 108 (90 Base) MCG/ACT inhaler Inhale 2 puffs into the lungs every 6 (six) hours as needed for wheezing or shortness of breath. 3 Inhaler 3  . Ascorbic Acid (VITAMIN C) 500 MG tablet Take 1,000 mg by mouth daily.     Marland Kitchen aspirin 81 MG tablet Take 81 mg by mouth every evening.     Marland Kitchen atorvastatin (LIPITOR) 40 MG tablet Take 40 mg by mouth daily at 6 PM.    . calcium carbonate (OS-CAL) 600 MG TABS Take 600 mg by mouth 2 (two) times daily with a meal.    . cetirizine (ZYRTEC) 10 MG tablet Take 10 mg by mouth daily.     . furosemide (LASIX) 40 MG tablet Take 40 mg by mouth daily.    . Glucosamine-Chondroitin 250-200 MG CAPS Take 2 capsules by mouth daily.     Marland Kitchen guaiFENesin-dextromethorphan (ROBITUSSIN DM) 100-10 MG/5ML syrup Take 5 mLs by mouth every 4 (four) hours as needed for cough.    . losartan  (COZAAR) 100 MG tablet TAKE 1 TABLET (100 MG TOTAL) BY MOUTH DAILY. 90 tablet 1  . Misc Natural Products (TART CHERRY ADVANCED) CAPS Take 2 capsules by mouth daily.     . Multiple Vitamin (MULTIVITAMIN) tablet Take 1 tablet by mouth daily.      . nitroGLYCERIN (NITROSTAT) 0.4 MG SL tablet Place 1 tablet (0.4 mg total) under the tongue every 5 (five) minutes as needed. For chest pain 25 tablet 3  . Omega-3 Fatty Acids (FISH OIL) 1200 MG CAPS Take 1,200 mg by mouth 2 (two) times daily.    . potassium chloride (K-DUR,KLOR-CON) 10 MEQ tablet TAKE 1 TABLET TWICE DAILY 180 tablet 3  . Probiotic Product (PHILLIPS COLON HEALTH PO) Take 1 capsule by mouth daily.    . traMADol (ULTRAM) 50 MG tablet Take 50 mg by mouth every 6 (six) hours as needed for pain.     . vitamin B-12 (CYANOCOBALAMIN) 1000 MCG tablet Take 1,000 mcg by mouth daily.    Marland Kitchen warfarin (COUMADIN) 10 MG tablet TAKE AS DIRECTED BY COUMADIN CLINIC 90 tablet 1  . ACCU-CHEK FASTCLIX LANCETS MISC USE TO CHECK BLOOD SUGAR TWICE A DAY AND AS NEEDED 306 each 3  . glucose blood (ACCU-CHEK SMARTVIEW) test strip USE TO CHECK BLOOD SUGAR TWICE A DAY AND  PRN 300 each 3   No current facility-administered medications for this visit.      Past Medical History:  Diagnosis Date  . Acute renal failure ([place]) 02/09/2013  . Aortic stenosis 08/25/2010   a. Bentall aortic root replacement with a St. Jude mechanical valve and Hemashield conduit 02/2004.  Marland Kitchen Arthritis   . Asthma   . AV BLOCK, COMPLETE    a. s/p St Jude dual chamber pacemaker 02/2004.  Marland Kitchen CAD (coronary artery disease)    a. s/p CABGx2 (LIMA-dLAD, SVG-Cx). b. Low risk nuc 12/2012 without ischemia, EF 46% mild apical hypokinesia (EF 55% inf HK by echo).  . Carotid artery disease ([place])    a. 0-39% bilateral ICA stenosis, stable mild hard plaque in carotid bulbs. F/u 03/2014 recommended.  . Diabetes mellitus without complication ([place])   . DIVERTICULOSIS, COLON 10/17/2007  . Ejection fraction    a. EF  55% with inf HK, mild MR by echo 12/2012.  Marland Kitchen GERD (gastroesophageal reflux disease)    hxof   . GOUT 01/16/2007  . HEMORRHOIDS, INTERNAL 11/01/2008  . HYPERLIPIDEMIA 01/16/2007  . HYPERTENSION 01/16/2007  . Impaired glucose tolerance   . LEG EDEMA 11/27/2008  . Special screening for malignant neoplasm of prostate   . Warfarin anticoagulation    AVR    ROS:   All systems reviewed and negative except as noted in the HPI.   Past Surgical History:  Procedure Laterality Date  . AORTIC VALVE REPLACEMENT    . CARDIAC CATHETERIZATION  06/2003  . CIRCUMCISION    . COLONOSCOPY    . CORONARY ARTERY BYPASS GRAFT    . coronary stents      prior to bypass   . EP IMPLANTABLE DEVICE N/A 05/06/2016   Procedure: PPM Generator Changeout;  Surgeon: Evans Lance, MD;  Location: [facility];  Service: Cardiovascular;  Laterality: N/A;  . ESOPHAGOGASTRODUODENOSCOPY    . HERNIA REPAIR    . INGUINAL HERNIA REPAIR Bilateral 02/07/2013   Procedure: LAPAROSCOPIC BILATERAL INGUINAL HERNIA REPAIR;  Surgeon: Imogene Burn. Georgette Dover, MD;  Location: [facility];  Service: General;  Laterality: Bilateral;  . INSERTION OF MESH Bilateral 02/07/2013   Procedure: INSERTION OF MESH;  Surgeon: Imogene Burn. Georgette Dover, MD;  Location: [facility];  Service: General;  Laterality: Bilateral;  . PACEMAKER INSERTION    . SHOULDER SURGERY Bilateral      Family History  Problem Relation Age of Onset  . Heart attack Father   . Kidney cancer Father   . Hypertension Mother   . Diabetes Mother   . Diabetes Brother   . Epilepsy Sister   . Diabetes Sister   . Diabetes Brother      Social History   Social History  . Marital status: Married    Spouse name: N/A  . Number of children: 3  . Years of education: N/A   Occupational History  . retired    Social History Main Topics  . Smoking status: Former Smoker    Quit date: 07/12/1973  . Smokeless tobacco: Never Used  . Alcohol use No  . Drug use: No  . Sexual activity: No   Other  Topics Concern  . Not on file   Social History Narrative  . No narrative on file     BP (!) 142/80   Pulse 76   Ht 5' 6.5" (1.689 m)   Wt 227 lb (103 kg)   BMI 36.09 kg/m   Physical Exam:  Well appearing 71 year old  man, NAD HEENT: Unremarkable Neck:  6 cm JVD, no thyromegally Lymphatics:  No adenopathy Back:  No CVA tenderness Lungs:  Clear, with no wheezes, rales, or rhonchi HEART:  Regular rate rhythm, no murmurs, no rubs, no clicks, mechanical S2 Abd:  soft, positive bowel sounds, no organomegally, no rebound, no guarding Ext:  2 plus pulses, no edema, no cyanosis, no clubbing Skin:  No rashes no nodules Neuro:  CN II through XII intact, motor grossly intact   DEVICE  Normal device function.  See PaceArt for details.   Assess/Plan: 1. Complete heart block - he has no escape today. He is asymptomatic status post pacemaker insertion. 2. Pacemaker - his [place] dual-chamber pacemaker is working normally. We'll recheck in several months. 3. Hypertension - his systolic blood pressure is a little elevated today. At home it is typically better. He is encouraged to lose weight and maintain a low-sodium diet.  Cristopher Peru, M.D.

## 2017-05-11 NOTE — Patient Instructions (Addendum)
Medication Instructions:  Your physician recommends that you continue on your current medications as directed. Please refer to the Current Medication list given to you today.  Labwork: None ordered.  Testing/Procedures: None ordered.  Follow-Up: Your physician wants you to follow-up in: one year with Dr. Lovena Le.   You will receive a reminder letter in the mail two months in advance. If you don't receive a letter, please call our office to schedule the follow-up appointment.  Remote monitoring is used to monitor your Pacemaker from home. This monitoring reduces the number of office visits required to check your device to one time per year. It allows [place] to keep an eye on the functioning of your device to ensure it is working properly. You are scheduled for a device check from home on 05/16/2017. You may send your transmission at any time that day. If you have a wireless device, the transmission will be sent automatically. After your physician reviews your transmission, you will receive a postcard with your next transmission date.    Any Other Special Instructions Will Be Listed Below (If Applicable).     If you need a refill on your cardiac medications before your next appointment, please call your pharmacy.

## 2017-05-16 LAB — CUP PACEART INCLINIC DEVICE CHECK
Battery Voltage: 3.01 V
Brady Statistic RA Percent Paced: 26 %
Implantable Lead Implant Date: 20050816
Implantable Lead Implant Date: 20050816
Implantable Lead Location: 753860
Lead Channel Impedance Value: 537.5 Ohm
Lead Channel Impedance Value: 762.5 Ohm
Lead Channel Pacing Threshold Pulse Width: 0.5 ms
Lead Channel Setting Pacing Amplitude: 0.75 V
Lead Channel Setting Pacing Amplitude: 2 V
Lead Channel Setting Pacing Pulse Width: 0.5 ms
Lead Channel Setting Sensing Sensitivity: 4 mV
MDC IDC LEAD LOCATION: 753859
MDC IDC MSMT BATTERY REMAINING LONGEVITY: 126 mo
MDC IDC MSMT LEADCHNL RA PACING THRESHOLD AMPLITUDE: 1 V
MDC IDC MSMT LEADCHNL RA PACING THRESHOLD PULSEWIDTH: 0.5 ms
MDC IDC MSMT LEADCHNL RA SENSING INTR AMPL: 3.7 mV
MDC IDC MSMT LEADCHNL RV PACING THRESHOLD AMPLITUDE: 0.5 V
MDC IDC PG IMPLANT DT: 20171026
MDC IDC PG SERIAL: 3182792
MDC IDC SESS DTM: 20181031141116
MDC IDC STAT BRADY RV PERCENT PACED: 99.81 %

## 2017-05-19 ENCOUNTER — Other Ambulatory Visit: Payer: Self-pay | Admitting: Internal Medicine

## 2017-05-25 ENCOUNTER — Encounter: Payer: Self-pay | Admitting: Cardiology

## 2017-05-25 ENCOUNTER — Ambulatory Visit (INDEPENDENT_AMBULATORY_CARE_PROVIDER_SITE_OTHER): Payer: Medicare HMO | Admitting: Pharmacist

## 2017-05-25 ENCOUNTER — Ambulatory Visit: Payer: Medicare HMO | Admitting: Cardiology

## 2017-05-25 VITALS — BP 126/66 | HR 73 | Ht 66.5 in | Wt 228.5 lb

## 2017-05-25 DIAGNOSIS — E785 Hyperlipidemia, unspecified: Secondary | ICD-10-CM | POA: Diagnosis not present

## 2017-05-25 DIAGNOSIS — I10 Essential (primary) hypertension: Secondary | ICD-10-CM

## 2017-05-25 DIAGNOSIS — Z950 Presence of cardiac pacemaker: Secondary | ICD-10-CM | POA: Diagnosis not present

## 2017-05-25 DIAGNOSIS — Z952 Presence of prosthetic heart valve: Secondary | ICD-10-CM | POA: Diagnosis not present

## 2017-05-25 DIAGNOSIS — I359 Nonrheumatic aortic valve disorder, unspecified: Secondary | ICD-10-CM

## 2017-05-25 DIAGNOSIS — Z951 Presence of aortocoronary bypass graft: Secondary | ICD-10-CM

## 2017-05-25 DIAGNOSIS — Z7901 Long term (current) use of anticoagulants: Secondary | ICD-10-CM

## 2017-05-25 DIAGNOSIS — Z5181 Encounter for therapeutic drug level monitoring: Secondary | ICD-10-CM

## 2017-05-25 LAB — POCT INR: INR: 2.6

## 2017-05-25 NOTE — Patient Instructions (Signed)
Continue on same dosage of 1 tablet every day. Recheck in 6 weeks. Call with any changes & when scheduled for colonoscopy or rt knee surgery [phone].

## 2017-05-25 NOTE — Patient Instructions (Signed)

## 2017-05-25 NOTE — Progress Notes (Signed)
Cardiology Office Note   Date:  05/25/2017   ID:  [name], DOB 11-Sep-1945, MRN [id number]  PCP:  Jeremy Lor, MD  Cardiologist:   Candee Furbish, MD       History of Present Illness: [name] is a 71 y.o. male former patient of Dr. Ron Parker here for followup. Has coronary artery disease as well as pacemaker, and aortic valve disease.  Pacer ERI - changeout sched soon. Dr. [facility] stay 01/2016 - CP, neg trop. ECHO reassuring.  In 2013, echo showed normal LV function with mild inferior hypokinesis. Has a mechanical aortic valve working well. Mild mitral regurgitation. Nuclear stress test in 2013 also showed no ischemia.  Knee discomfort has been an issue. He was a Development worker, community for several years. Has lower extremity edema bilaterally, mostly on right leg. Overall doing well. Feels some mild shortness of breath.  Weight loss has been an issue for him.  No chest pain, no syncope, no bleeding.  Previously had a hernia surgery and while on Lovenox bridge protocol bled, had to go back to the hospital for 2 weeks.  05/25/17-I reviewed office note from 05/11/17 from Dr. Cristopher Whitney regarding his pacemaker and previous complete heart block.  He has no escape.  Saint Jude.   Past Medical History:  Diagnosis Date  . Acute renal failure ([name]) 02/09/2013  . Aortic stenosis 08/25/2010   a. Bentall aortic root replacement with a St. Jude mechanical valve and Hemashield conduit 02/2004.  [name] Arthritis   . Asthma   . AV BLOCK, COMPLETE    a. s/p St Jude dual chamber pacemaker 02/2004.  [name] CAD (coronary artery disease)    a. s/p CABGx2 (LIMA-dLAD, SVG-Cx). b. Low risk nuc 12/2012 without ischemia, EF 46% mild apical hypokinesia (EF 55% inf HK by echo).  . Carotid artery disease ([name])    a. 0-39% bilateral ICA stenosis, stable mild hard plaque in carotid bulbs. F/u 03/2014 recommended.  . Diabetes mellitus without complication ([name])   . DIVERTICULOSIS, COLON 10/17/2007  .  Ejection fraction    a. EF 55% with inf HK, mild MR by echo 12/2012.  [name] GERD (gastroesophageal reflux disease)    hxof   . GOUT 01/16/2007  . HEMORRHOIDS, INTERNAL 11/01/2008  . HYPERLIPIDEMIA 01/16/2007  . HYPERTENSION 01/16/2007  . Impaired glucose tolerance   . LEG EDEMA 11/27/2008  . Special screening for malignant neoplasm of prostate   . Warfarin anticoagulation    AVR    Past Surgical History:  Procedure Laterality Date  . AORTIC VALVE REPLACEMENT    . CARDIAC CATHETERIZATION  06/2003  . CIRCUMCISION    . COLONOSCOPY    . CORONARY ARTERY BYPASS GRAFT    . coronary stents      prior to bypass   . ESOPHAGOGASTRODUODENOSCOPY    . HERNIA REPAIR    . PACEMAKER INSERTION    . SHOULDER SURGERY Bilateral      Current Outpatient Medications  Medication Sig Dispense Refill  . ACCU-CHEK FASTCLIX LANCETS MISC USE TO CHECK BLOOD SUGAR TWICE A DAY AND AS NEEDED 306 each 3  . acetaminophen (TYLENOL ARTHRITIS PAIN) 650 MG CR tablet Take 1,300 mg by mouth 2 (two) times daily.    [name] albuterol (PROVENTIL HFA;VENTOLIN HFA) 108 (90 Base) MCG/ACT inhaler Inhale 2 puffs into the lungs every 6 (six) hours as needed for wheezing or shortness of breath. 3 Inhaler 3  . Ascorbic Acid (VITAMIN C) 500 MG tablet Take  1,000 mg by mouth daily.     [name] aspirin 81 MG tablet Take 81 mg by mouth every evening.     [name] atorvastatin (LIPITOR) 40 MG tablet Take 40 mg by mouth daily at 6 PM.    . calcium carbonate (OS-CAL) 600 MG TABS Take 600 mg by mouth 2 (two) times daily with a meal.    . cetirizine (ZYRTEC) 10 MG tablet Take 10 mg by mouth daily.     . furosemide (LASIX) 40 MG tablet TAKE 1 TABLET TWICE DAILY 180 tablet 1  . Glucosamine-Chondroitin 250-200 MG CAPS Take 2 capsules by mouth daily.     [name] glucose blood (ACCU-CHEK SMARTVIEW) test strip USE TO CHECK BLOOD SUGAR TWICE A DAY AND PRN 300 each 3  . guaiFENesin-dextromethorphan (ROBITUSSIN DM) 100-10 MG/5ML syrup Take 5 mLs by mouth every 4 (four) hours as  needed for cough.    . losartan (COZAAR) 100 MG tablet TAKE 1 TABLET (100 MG TOTAL) BY MOUTH DAILY. 90 tablet 1  . metFORMIN (GLUCOPHAGE) 1000 MG tablet Take 1,000 mg by mouth 2 (two) times daily with a meal.    . metoprolol succinate (TOPROL-XL) 100 MG 24 hr tablet Take 100 mg by mouth daily with breakfast.    . Misc Natural Products (TART CHERRY ADVANCED) CAPS Take 2 capsules by mouth daily.     . Multiple Vitamin (MULTIVITAMIN) tablet Take 1 tablet by mouth daily.      . nitroGLYCERIN (NITROSTAT) 0.4 MG SL tablet Place 1 tablet (0.4 mg total) under the tongue every 5 (five) minutes as needed. For chest pain 25 tablet 3  . Omega-3 Fatty Acids (FISH OIL) 1200 MG CAPS Take 1,200 mg by mouth 2 (two) times daily.    . potassium chloride (K-DUR,KLOR-CON) 10 MEQ tablet Take 10 mEq by mouth 2 (two) times daily.    . Probiotic Product (PHILLIPS COLON HEALTH PO) Take 1 capsule by mouth daily.    . traMADol (ULTRAM) 50 MG tablet Take 50 mg by mouth every 6 (six) hours as needed for pain.     . vitamin B-12 (CYANOCOBALAMIN) 1000 MCG tablet Take 1,000 mcg by mouth daily.    [name] warfarin (COUMADIN) 10 MG tablet TAKE AS DIRECTED BY COUMADIN CLINIC 90 tablet 1   No current facility-administered medications for this visit.     Allergies:   Bee venom; Codeine phosphate; Metoprolol tartrate; Poison ivy extract; Poison oak extract; and Ramipril    Social History:  The patient  reports that he quit smoking about 43 years ago. he has never used smokeless tobacco. He reports that he does not drink alcohol or use drugs. Retired Development worker, community    Family History:  The patient's family history includes Diabetes in his brother, brother, mother, and sister; Epilepsy in his sister; Heart attack in his father; Hypertension in his mother; Kidney cancer in his father.    ROS:  Please see the history of present illness.   Otherwise, review of systems are positive for none.   All other systems are reviewed and negative.     PHYSICAL EXAM: VS:  BP 126/66 (BP Location: Right Arm, Patient Position: Sitting, Cuff Size: Normal)   Pulse 73   Ht 5' 6.5" (1.689 m)   Wt 228 lb 8 oz (103.6 kg)   SpO2 96%   BMI 36.33 kg/m  , BMI Body mass index is 36.33 kg/m. GEN: Well nourished, well developed, in no acute distress  HEENT: normal  Neck: no JVD, carotid bruits,  or masses Cardiac: RRR; S2 CLICK no murmurs, rubs, or gallops, mild chronic LE edema  Respiratory:  clear to auscultation bilaterally, normal work of breathing GI: soft, nontender, nondistended, + BS MS: no deformity or atrophy  Skin: warm and dry, no rash Neuro:  Alert and Oriented x 3, Strength and sensation are intact Psych: euthymic mood, full affect    EKG:  EKG is not ordered today.  Recent Labs: 10/11/2016: ALT 15; BUN 18; Creatinine, Ser 0.82; Hemoglobin 13.8; Platelets 207.0; Potassium 4.3; Sodium 143; TSH 1.27    Lipid Panel    Component Value Date/Time   CHOL 139 10/11/2016 1327   TRIG 104.0 10/11/2016 1327   TRIG 91 06/14/2006 0825   HDL 43.10 10/11/2016 1327   CHOLHDL 3 10/11/2016 1327   VLDL 20.8 10/11/2016 1327   LDLCALC 75 10/11/2016 1327      Wt Readings from Last 3 Encounters:  05/25/17 228 lb 8 oz (103.6 kg)  05/11/17 227 lb (103 kg)  04/12/17 229 lb 3.2 oz (104 kg)    04/2014-vascular study, carotids Heterogeneous plaque, bilaterally. Stable, 1-39% bilateral ICA stenosis. Patent vertebral arteries with antegrade flow. Normal subclavian arteries, bilaterally. f/u 2 years.  Other studies Reviewed: ECHO 02/06/16: - Left ventricle: LVEF is approximately 55% with hypokinesis of the   distal inferior/inferoseptal walls. The cavity size was normal.   Wall thickness was normal. Doppler parameters are consistent with   abnormal left ventricular relaxation (grade 1 diastolic   dysfunction). - Aortic valve: AV prosthesis appears to move well Peak and mean   gradients through the valve are 16 and 10 mm Hg  respectively.   There was trivial regurgitation. Valve area (VTI): 2.77 cm^2.   Valve area (Vmax): 2.63 cm^2. Valve area (Vmean): 2.55 cm^2. - Right ventricle: The cavity size was mildly dilated. Wall   thickness was normal. - Right atrium: The atrium was mildly dilated.  Additional studies/ records that were reviewed today include: Prior office notes reviewed, lab work reviewed, previous LDL 121. Review of the above records demonstrates: As above   ASSESSMENT AND PLAN:  1.  Aortic stenosis-history of aortic valve replacement in 2005, Bentall, mechanical. Echo 2017 functioning well.  2. History of aortic valve replacement, mechanical-2005 as above. Functioning well. Echocardiogram reviewed.  Coumadin clinic.  Dental prophylaxis.  3. Coronary artery disease-bypass surgery at the time of aortic valve replacement 2005. 2013 no ischemia on nuclear stress test. Secondary prevention.  4. Hyperlipidemia-previously changed from Pravachol to atorvastatin, guideline directed therapy. LDL goal 70. Dr. Beverely Risen has been checking labs.  Overall doing well.  5. Carotid artery disease-mild plaque previously. Stable disease. Mild plaque bilaterally.  Continue with secondary prevention  6. Pacemaker-change out 05/06/16 - Dr. Lovena Le. Visit reviewed.  No changes made.  Reminder, he does not have an escape rhythm.  7. Warfarin- anticoagulated for his aortic valve prosthesis.  8. Obesity-continue to encourage weight loss, decrease carbohydrates. Weight loss will help with his diabetes, taking metformin, diet controlled. This will also help with his lower extremity edema     Current medicines are reviewed at length with the patient today.  The patient does not have concerns regarding medicines.  The following changes have been made:  no change  Labs/ tests ordered today include: none  No orders of the defined types were placed in this encounter.    Disposition:   FU with [place] in 1  year  Signed, Candee Furbish, MD  05/25/2017 10:43 AM    [facility] [address], [place], [place]  [address] Phone: [phone]; Fax: [phone]

## 2017-05-30 ENCOUNTER — Other Ambulatory Visit: Payer: Self-pay | Admitting: Internal Medicine

## 2017-05-30 MED ORDER — SILDENAFIL CITRATE 20 MG PO TABS: ORAL_TABLET | ORAL | 3 refills | Status: DC

## 2017-05-31 ENCOUNTER — Telehealth: Payer: Self-pay | Admitting: Family Medicine

## 2017-05-31 ENCOUNTER — Telehealth: Payer: Self-pay | Admitting: Internal Medicine

## 2017-05-31 NOTE — Telephone Encounter (Signed)
Copied from [place] [id number]. Topic: General - Other >> May 31, 2017  1:43 PM Oneta Rack wrote: Osvaldo Human name: Mrs. Zeller  Relation to RU:[id number]  Call back number: [phone]  Pharmacy:[facility]   Reason for call:  [facility] informed patient Prior Authorization is needed for sildenafil (REVATIO) 20 MG tablet and please call [facility] PA department at  [phone]

## 2017-05-31 NOTE — Telephone Encounter (Signed)
Copied from [place] [id number]. Topic: General - Other >> May 31, 2017  1:43 PM Oneta Rack wrote: Osvaldo Human name: Mrs. Marinaro  Relation to [id number]  Call back number: [phone]  Pharmacy:[facility]   Reason for call:  [facility] informed patient Prior Authorization is needed for sildenafil (REVATIO) 20 MG tablet and please call [facility] PA department at  [phone]

## 2017-06-06 NOTE — Telephone Encounter (Signed)
Prior auth requested via [email or web address] with the diagnosis of ED per the Rx instructions.  Key: [id number]

## 2017-06-06 NOTE — Telephone Encounter (Signed)
Dr Adriana Simas you please let me know what is the diagnosis needed for Sildenafil?  I did not see one listed under the pts problem list.

## 2017-06-13 NOTE — Telephone Encounter (Signed)
Letter received from [facility] stating the Rx was denied for ED.  Letter given to [facility] for the physician to review.

## 2017-06-13 NOTE — Telephone Encounter (Signed)
Notify patient that this medication is not covered by his drug benefit plan. This medication is available in a generic. Ask  him to check various drug stores to determine the best price and we will call this medication in which he will have to pay out-of-pocket

## 2017-06-13 NOTE — Telephone Encounter (Signed)
PA denied, please advise.  

## 2017-06-15 NOTE — Telephone Encounter (Signed)
Notified patient that this medication is not covered by his drug benefit plan. This medication is available in a generic. Asked pt to check various drug stores to determine the best price and we will call this medication in which he will have to pay out-of-pocket. Patient verbalized understanding.

## 2017-07-06 ENCOUNTER — Ambulatory Visit (INDEPENDENT_AMBULATORY_CARE_PROVIDER_SITE_OTHER): Payer: Medicare HMO

## 2017-07-06 DIAGNOSIS — Z952 Presence of prosthetic heart valve: Secondary | ICD-10-CM | POA: Diagnosis not present

## 2017-07-06 DIAGNOSIS — Z09 Encounter for follow-up examination after completed treatment for conditions other than malignant neoplasm: Secondary | ICD-10-CM

## 2017-07-06 DIAGNOSIS — Z5181 Encounter for therapeutic drug level monitoring: Secondary | ICD-10-CM | POA: Diagnosis not present

## 2017-07-06 LAB — POCT INR: INR: 2.4

## 2017-07-06 NOTE — Patient Instructions (Signed)
Continue on same dosage of 1 tablet every day. Recheck in 6 weeks. Call with any changes & when scheduled for colonoscopy or rt knee surgery [phone].

## 2017-08-10 ENCOUNTER — Ambulatory Visit (INDEPENDENT_AMBULATORY_CARE_PROVIDER_SITE_OTHER): Payer: Medicare HMO | Admitting: *Deleted

## 2017-08-10 DIAGNOSIS — I442 Atrioventricular block, complete: Secondary | ICD-10-CM | POA: Diagnosis not present

## 2017-08-10 NOTE — Progress Notes (Signed)
Remote pacemaker transmission.   

## 2017-08-11 ENCOUNTER — Encounter: Payer: Self-pay | Admitting: Cardiology

## 2017-08-17 ENCOUNTER — Ambulatory Visit (INDEPENDENT_AMBULATORY_CARE_PROVIDER_SITE_OTHER): Payer: Medicare HMO

## 2017-08-17 DIAGNOSIS — Z952 Presence of prosthetic heart valve: Secondary | ICD-10-CM | POA: Diagnosis not present

## 2017-08-17 DIAGNOSIS — Z09 Encounter for follow-up examination after completed treatment for conditions other than malignant neoplasm: Secondary | ICD-10-CM | POA: Diagnosis not present

## 2017-08-17 DIAGNOSIS — Z5181 Encounter for therapeutic drug level monitoring: Secondary | ICD-10-CM

## 2017-08-17 LAB — POCT INR: INR: 2.5

## 2017-08-17 NOTE — Patient Instructions (Signed)
Description   Continue on same dosage of 1 tablet every day. Recheck in 6 weeks. Call with any changes & when scheduled for colonoscopy or rt knee surgery [phone].      

## 2017-08-25 LAB — CUP PACEART REMOTE DEVICE CHECK
Battery Remaining Percentage: 95.5 %
Battery Voltage: 2.99 V
Brady Statistic AP VS Percent: 1 %
Brady Statistic AS VP Percent: 70 %
Brady Statistic AS VS Percent: 1 %
Implantable Lead Location: 753860
Lead Channel Impedance Value: 510 Ohm
Lead Channel Impedance Value: 740 Ohm
Lead Channel Pacing Threshold Amplitude: 0.5 V
Lead Channel Pacing Threshold Pulse Width: 0.5 ms
Lead Channel Setting Pacing Amplitude: 2 V
Lead Channel Setting Pacing Pulse Width: 0.5 ms
MDC IDC LEAD IMPLANT DT: 20050816
MDC IDC LEAD IMPLANT DT: 20050816
MDC IDC LEAD LOCATION: 753859
MDC IDC MSMT BATTERY REMAINING LONGEVITY: 130 mo
MDC IDC MSMT LEADCHNL RA PACING THRESHOLD AMPLITUDE: 1 V
MDC IDC MSMT LEADCHNL RA PACING THRESHOLD PULSEWIDTH: 0.5 ms
MDC IDC MSMT LEADCHNL RA SENSING INTR AMPL: 2.4 mV
MDC IDC PG IMPLANT DT: 20171026
MDC IDC SESS DTM: 20190130070014
MDC IDC SET LEADCHNL RV PACING AMPLITUDE: 0.75 V
MDC IDC SET LEADCHNL RV SENSING SENSITIVITY: 4 mV
MDC IDC STAT BRADY AP VP PERCENT: 30 %
MDC IDC STAT BRADY RA PERCENT PACED: 29 %
MDC IDC STAT BRADY RV PERCENT PACED: 99 %
Pulse Gen Model: 2272
Pulse Gen Serial Number: 3182792

## 2017-08-29 DIAGNOSIS — M13862 Other specified arthritis, left knee: Secondary | ICD-10-CM | POA: Diagnosis not present

## 2017-08-29 DIAGNOSIS — M13861 Other specified arthritis, right knee: Secondary | ICD-10-CM | POA: Diagnosis not present

## 2017-09-28 ENCOUNTER — Ambulatory Visit (INDEPENDENT_AMBULATORY_CARE_PROVIDER_SITE_OTHER): Payer: Medicare HMO | Admitting: Pharmacist

## 2017-09-28 DIAGNOSIS — Z952 Presence of prosthetic heart valve: Secondary | ICD-10-CM | POA: Diagnosis not present

## 2017-09-28 DIAGNOSIS — Z5181 Encounter for therapeutic drug level monitoring: Secondary | ICD-10-CM

## 2017-09-28 DIAGNOSIS — Z09 Encounter for follow-up examination after completed treatment for conditions other than malignant neoplasm: Secondary | ICD-10-CM | POA: Diagnosis not present

## 2017-09-28 LAB — POCT INR: INR: 2.9

## 2017-09-28 NOTE — Patient Instructions (Signed)
Description   Continue on same dosage of 1 tablet every day. Recheck in 6 weeks. Call with any changes & when scheduled for colonoscopy or rt knee surgery [phone].      

## 2017-10-03 ENCOUNTER — Other Ambulatory Visit: Payer: Self-pay | Admitting: Internal Medicine

## 2017-10-11 ENCOUNTER — Encounter: Payer: Self-pay | Admitting: Internal Medicine

## 2017-10-11 ENCOUNTER — Ambulatory Visit (INDEPENDENT_AMBULATORY_CARE_PROVIDER_SITE_OTHER): Payer: Medicare HMO | Admitting: Internal Medicine

## 2017-10-11 VITALS — BP 142/70 | HR 71 | Temp 98.6°F | Wt 226.0 lb

## 2017-10-11 DIAGNOSIS — E785 Hyperlipidemia, unspecified: Secondary | ICD-10-CM

## 2017-10-11 DIAGNOSIS — Z951 Presence of aortocoronary bypass graft: Secondary | ICD-10-CM

## 2017-10-11 DIAGNOSIS — I10 Essential (primary) hypertension: Secondary | ICD-10-CM

## 2017-10-11 DIAGNOSIS — E119 Type 2 diabetes mellitus without complications: Secondary | ICD-10-CM

## 2017-10-11 DIAGNOSIS — I350 Nonrheumatic aortic (valve) stenosis: Secondary | ICD-10-CM | POA: Diagnosis not present

## 2017-10-11 LAB — CBC WITH DIFFERENTIAL/PLATELET
BASOS ABS: 0 10*3/uL (ref 0.0–0.1)
BASOS PCT: 0.5 % (ref 0.0–3.0)
EOS ABS: 0.1 10*3/uL (ref 0.0–0.7)
Eosinophils Relative: 1.5 % (ref 0.0–5.0)
HEMATOCRIT: 41.9 % (ref 39.0–52.0)
Hemoglobin: 13.9 g/dL (ref 13.0–17.0)
LYMPHS PCT: 24.4 % (ref 12.0–46.0)
Lymphs Abs: 1.7 10*3/uL (ref 0.7–4.0)
MCHC: 33.2 g/dL (ref 30.0–36.0)
MCV: 87.8 fl (ref 78.0–100.0)
MONOS PCT: 7.6 % (ref 3.0–12.0)
Monocytes Absolute: 0.5 10*3/uL (ref 0.1–1.0)
Neutro Abs: 4.7 10*3/uL (ref 1.4–7.7)
Neutrophils Relative %: 66 % (ref 43.0–77.0)
Platelets: 195 10*3/uL (ref 150.0–400.0)
RBC: 4.77 Mil/uL (ref 4.22–5.81)
RDW: 14.5 % (ref 11.5–15.5)
WBC: 7.1 10*3/uL (ref 4.0–10.5)

## 2017-10-11 LAB — LIPID PANEL
CHOL/HDL RATIO: 3
CHOLESTEROL: 120 mg/dL (ref 0–200)
HDL: 43.3 mg/dL (ref >39.00)
LDL CALC: 55 mg/dL (ref 0–99)
NonHDL: 76.75
TRIGLYCERIDES: 109 mg/dL (ref 0.0–149.0)
VLDL: 21.8 mg/dL (ref 0.0–40.0)

## 2017-10-11 LAB — COMPREHENSIVE METABOLIC PANEL
ALBUMIN: 4.1 g/dL (ref 3.5–5.2)
ALT: 21 U/L (ref 0–53)
AST: 25 U/L (ref 0–37)
Alkaline Phosphatase: 45 U/L (ref 39–117)
BUN: 17 mg/dL (ref 6–23)
CALCIUM: 9.6 mg/dL (ref 8.4–10.5)
CHLORIDE: 103 meq/L (ref 96–112)
CO2: 30 meq/L (ref 19–32)
CREATININE: 0.78 mg/dL (ref 0.40–1.50)
GFR: 103.61 mL/min (ref >60.00)
Glucose, Bld: 92 mg/dL (ref 70–99)
Potassium: 4.3 mEq/L (ref 3.5–5.1)
Sodium: 139 mEq/L (ref 135–145)
Total Bilirubin: 0.6 mg/dL (ref 0.2–1.2)
Total Protein: 6.2 g/dL (ref 6.0–8.3)

## 2017-10-11 LAB — TSH: TSH: 2.16 u[IU]/mL (ref 0.35–4.50)

## 2017-10-11 LAB — POCT GLYCOSYLATED HEMOGLOBIN (HGB A1C): Hemoglobin A1C: 5.7

## 2017-10-11 LAB — MICROALBUMIN / CREATININE URINE RATIO
CREATININE, U: 151.8 mg/dL
Microalb Creat Ratio: 0.5 mg/g (ref 0.0–30.0)
Microalb, Ur: 0.8 mg/dL (ref 0.0–1.9)

## 2017-10-11 NOTE — Progress Notes (Signed)
Subjective:    Patient ID: [name], male    DOB: 10/13/1945, 72 y.o.   MRN: [id number]  HPI 72 year old patient who is seen today for follow-up.  He is followed by cardiology status post AVR and status post CABG.  Remains on chronic Coumadin anticoagulation.  Status post pacemaker He has type 2 diabetes which has been well controlled.  Hemoglobin A1c today 5.7.  He has essential hypertension Doing quite well today except for some mild rhinorrhea.  Has been seen by ophthalmology.  No recent lab  Past Medical History:  Diagnosis Date  . Acute renal failure ([place]) 02/09/2013  . Aortic stenosis 08/25/2010   a. Bentall aortic root replacement with a St. Jude mechanical valve and Hemashield conduit 02/2004.  Marland Kitchen Arthritis   . Asthma   . AV BLOCK, COMPLETE    a. s/p St Jude dual chamber pacemaker 02/2004.  Marland Kitchen CAD (coronary artery disease)    a. s/p CABGx2 (LIMA-dLAD, SVG-Cx). b. Low risk nuc 12/2012 without ischemia, EF 46% mild apical hypokinesia (EF 55% inf HK by echo).  . Carotid artery disease ([place])    a. 0-39% bilateral ICA stenosis, stable mild hard plaque in carotid bulbs. F/u 03/2014 recommended.  . Diabetes mellitus without complication ([place])   . DIVERTICULOSIS, COLON 10/17/2007  . Ejection fraction    a. EF 55% with inf HK, mild MR by echo 12/2012.  Marland Kitchen GERD (gastroesophageal reflux disease)    hxof   . GOUT 01/16/2007  . HEMORRHOIDS, INTERNAL 11/01/2008  . HYPERLIPIDEMIA 01/16/2007  . HYPERTENSION 01/16/2007  . Impaired glucose tolerance   . LEG EDEMA 11/27/2008  . Special screening for malignant neoplasm of prostate   . Warfarin anticoagulation    AVR     Social History   Socioeconomic History  . Marital status: Married    Spouse name: Not on file  . Number of children: 3  . Years of education: Not on file  . Highest education level: Not on file  Occupational History  . Occupation: retired  Scientific laboratory technician  . Financial resource strain: Not on file  . Food insecurity:   Worry: Not on file    Inability: Not on file  . Transportation needs:    Medical: Not on file    Non-medical: Not on file  Tobacco Use  . Smoking status: Former Smoker    Last attempt to quit: 07/12/1973    Years since quitting: 44.2  . Smokeless tobacco: Never Used  Substance and Sexual Activity  . Alcohol use: No  . Drug use: No  . Sexual activity: Never  Lifestyle  . Physical activity:    Days per week: Not on file    Minutes per session: Not on file  . Stress: Not on file  Relationships  . Social connections:    Talks on phone: Not on file    Gets together: Not on file    Attends religious service: Not on file    Active member of club or organization: Not on file    Attends meetings of clubs or organizations: Not on file    Relationship status: Not on file  . Intimate partner violence:    Fear of current or ex partner: Not on file    Emotionally abused: Not on file    Physically abused: Not on file    Forced sexual activity: Not on file  Other Topics Concern  . Not on file  Social History Narrative  . Not on file  Past Surgical History:  Procedure Laterality Date  . AORTIC VALVE REPLACEMENT    . CARDIAC CATHETERIZATION  06/2003  . CIRCUMCISION    . COLONOSCOPY    . CORONARY ARTERY BYPASS GRAFT    . coronary stents      prior to bypass   . EP IMPLANTABLE DEVICE N/A 05/06/2016   Procedure: PPM Generator Changeout;  Surgeon: Evans Lance, MD;  Location: [facility];  Service: Cardiovascular;  Laterality: N/A;  . ESOPHAGOGASTRODUODENOSCOPY    . HERNIA REPAIR    . INGUINAL HERNIA REPAIR Bilateral 02/07/2013   Procedure: LAPAROSCOPIC BILATERAL INGUINAL HERNIA REPAIR;  Surgeon: Imogene Burn. Georgette Dover, MD;  Location: [facility];  Service: General;  Laterality: Bilateral;  . INSERTION OF MESH Bilateral 02/07/2013   Procedure: INSERTION OF MESH;  Surgeon: Imogene Burn. Georgette Dover, MD;  Location: [facility];  Service: General;  Laterality: Bilateral;  . PACEMAKER INSERTION    .  SHOULDER SURGERY Bilateral     Family History  Problem Relation Age of Onset  . Heart attack Father   . Kidney cancer Father   . Hypertension Mother   . Diabetes Mother   . Diabetes Brother   . Epilepsy Sister   . Diabetes Sister   . Diabetes Brother     Allergies  Allergen Reactions  . Bee Venom Shortness Of Breath and Swelling  . Codeine Phosphate Hives, Shortness Of Breath, Itching, Rash and Other (See Comments)    REACTION: difficulty breathing  . Metoprolol Tartrate Other (See Comments)    REACTION: loss of vision. Can take the XL tablet not regular   . Poison Ivy Extract     Severe rash and hives all over body  . Poison Oak Extract     Severe rash and hives all over body  . Ramipril Rash and Cough    Current Outpatient Medications on File Prior to Visit  Medication Sig Dispense Refill  . ACCU-CHEK FASTCLIX LANCETS MISC USE TO CHECK BLOOD SUGAR TWICE A DAY AND AS NEEDED 306 each 3  . acetaminophen (TYLENOL ARTHRITIS PAIN) 650 MG CR tablet Take 1,300 mg by mouth 2 (two) times daily.    Marland Kitchen albuterol (PROVENTIL HFA;VENTOLIN HFA) 108 (90 Base) MCG/ACT inhaler INHALE 2 PUFFS INTO THE LUNGS EVERY 6 (SIX) HOURS AS NEEDED FOR WHEEZING OR SHORTNESS OF BREATH. 54 g 3  . Ascorbic Acid (VITAMIN C) 500 MG tablet Take 1,000 mg by mouth daily.     Marland Kitchen aspirin 81 MG tablet Take 81 mg by mouth every evening.     Marland Kitchen atorvastatin (LIPITOR) 40 MG tablet TAKE 1 TABLET EVERY DAY AT 6 PM 90 tablet 2  . calcium carbonate (OS-CAL) 600 MG TABS Take 600 mg by mouth 2 (two) times daily with a meal.    . cetirizine (ZYRTEC) 10 MG tablet Take 10 mg by mouth daily.     . furosemide (LASIX) 40 MG tablet TAKE 1 TABLET TWICE DAILY 180 tablet 1  . Glucosamine-Chondroitin 250-200 MG CAPS Take 2 capsules by mouth daily.     Marland Kitchen glucose blood (ACCU-CHEK SMARTVIEW) test strip USE TO CHECK BLOOD SUGAR TWICE A DAY AND PRN 300 each 3  . guaiFENesin-dextromethorphan (ROBITUSSIN DM) 100-10 MG/5ML syrup Take 5 mLs by  mouth every 4 (four) hours as needed for cough.    . losartan (COZAAR) 100 MG tablet TAKE 1 TABLET EVERY DAY 90 tablet 2  . metFORMIN (GLUCOPHAGE) 1000 MG tablet Take 1,000 mg by mouth 2 (two) times daily  with a meal.    . metoprolol succinate (TOPROL-XL) 100 MG 24 hr tablet TAKE 1 TABLET EVERY MORNING WITH OR IMMEDIATELY FOLLOWING A MEAL 90 tablet 2  . Misc Natural Products (TART CHERRY ADVANCED) CAPS Take 2 capsules by mouth daily.     . Multiple Vitamin (MULTIVITAMIN) tablet Take 1 tablet by mouth daily.      . nitroGLYCERIN (NITROSTAT) 0.4 MG SL tablet Place 1 tablet (0.4 mg total) under the tongue every 5 (five) minutes as needed. For chest pain 25 tablet 3  . Omega-3 Fatty Acids (FISH OIL) 1200 MG CAPS Take 1,200 mg by mouth 2 (two) times daily.    . potassium chloride (K-DUR,KLOR-CON) 10 MEQ tablet Take 10 mEq by mouth 2 (two) times daily.    . Probiotic Product (PHILLIPS COLON HEALTH PO) Take 1 capsule by mouth daily.    . traMADol (ULTRAM) 50 MG tablet Take 50 mg by mouth every 6 (six) hours as needed for pain.     . vitamin B-12 (CYANOCOBALAMIN) 1000 MCG tablet Take 1,000 mcg by mouth daily.    Marland Kitchen warfarin (COUMADIN) 10 MG tablet TAKE AS DIRECTED BY COUMADIN CLINIC 90 tablet 1   No current facility-administered medications on file prior to visit.     BP (!) 142/70 (BP Location: Right Arm, Patient Position: Sitting, Cuff Size: Large)   Pulse 71   Temp 98.6 F (37 C) (Oral)   Wt 226 lb (102.5 kg)   SpO2 95%   BMI 35.93 kg/m      Review of Systems  Constitutional: Negative for appetite change, chills, fatigue and fever.  HENT: Positive for postnasal drip and rhinorrhea. Negative for congestion, dental problem, ear pain, hearing loss, sore throat, tinnitus, trouble swallowing and voice change.   Eyes: Negative for pain, discharge and visual disturbance.  Respiratory: Negative for cough, chest tightness, wheezing and stridor.   Cardiovascular: Negative for chest pain,  palpitations and leg swelling.  Gastrointestinal: Negative for abdominal distention, abdominal pain, blood in stool, constipation, diarrhea, nausea and vomiting.  Genitourinary: Negative for difficulty urinating, discharge, flank pain, genital sores, hematuria and urgency.  Musculoskeletal: Negative for arthralgias, back pain, gait problem, joint swelling, myalgias and neck stiffness.  Skin: Negative for rash.  Neurological: Negative for dizziness, syncope, speech difficulty, weakness, numbness and headaches.  Hematological: Negative for adenopathy. Does not bruise/bleed easily.  Psychiatric/Behavioral: Negative for behavioral problems and dysphoric mood. The patient is not nervous/anxious.        Objective:   Physical Exam  Constitutional: He is oriented to person, place, and time. He appears well-developed.  HENT:  Head: Normocephalic.  Right Ear: External ear normal.  Left Ear: External ear normal.  Eyes: Conjunctivae and EOM are normal.  Neck: Normal range of motion.  Cardiovascular: Normal rate, regular rhythm and normal heart sounds.  Prosthetic heart sounds  Pulmonary/Chest: Breath sounds normal.  Abdominal: Bowel sounds are normal.  Musculoskeletal: Normal range of motion. He exhibits no edema or tenderness.  Neurological: He is alert and oriented to person, place, and time.  Psychiatric: He has a normal mood and affect. His behavior is normal.          Assessment & Plan:   Type 2 diabetes.  Well controlled Obesity.  Continue efforts at weight loss Essential hypertension stable Status post AVR Chronic Coumadin anticoagulation Coronary artery disease  We will check updated lab including urine for microalbumin and lipid profile Follow-up 6 months  [name]

## 2017-10-11 NOTE — Patient Instructions (Signed)
Limit your sodium (Salt) intake    It is important that you exercise regularly, at least 20 minutes 3 to 4 times per week.  If you develop chest pain or shortness of breath seek  medical attention.  You need to lose weight.  Consider a lower calorie diet and regular exercise.  Cardiology follow-up as scheduled  Return in 6 months for follow-up

## 2017-11-09 ENCOUNTER — Ambulatory Visit (INDEPENDENT_AMBULATORY_CARE_PROVIDER_SITE_OTHER): Payer: Medicare HMO | Admitting: *Deleted

## 2017-11-09 DIAGNOSIS — Z5181 Encounter for therapeutic drug level monitoring: Secondary | ICD-10-CM

## 2017-11-09 DIAGNOSIS — I442 Atrioventricular block, complete: Secondary | ICD-10-CM

## 2017-11-09 DIAGNOSIS — Z09 Encounter for follow-up examination after completed treatment for conditions other than malignant neoplasm: Secondary | ICD-10-CM

## 2017-11-09 DIAGNOSIS — Z952 Presence of prosthetic heart valve: Secondary | ICD-10-CM | POA: Diagnosis not present

## 2017-11-09 LAB — POCT INR: INR: 2.5

## 2017-11-09 NOTE — Progress Notes (Signed)
Remote pacemaker transmission.   

## 2017-11-09 NOTE — Patient Instructions (Signed)
Description   Continue on same dosage of 1 tablet every day. Recheck in 6 weeks. Call with any changes & when scheduled for colonoscopy or rt knee surgery [phone].      

## 2017-11-10 ENCOUNTER — Encounter: Payer: Self-pay | Admitting: Cardiology

## 2017-11-29 DIAGNOSIS — M170 Bilateral primary osteoarthritis of knee: Secondary | ICD-10-CM | POA: Diagnosis not present

## 2017-11-29 LAB — CUP PACEART REMOTE DEVICE CHECK
Battery Remaining Longevity: 130 mo
Battery Remaining Percentage: 95.5 %
Battery Voltage: 2.99 V
Brady Statistic AP VS Percent: 1 %
Brady Statistic AS VS Percent: 1 %
Brady Statistic RV Percent Paced: 99 %
Date Time Interrogation Session: 20190501060014
Implantable Lead Implant Date: 20050816
Implantable Lead Location: 753860
Implantable Pulse Generator Implant Date: 20171026
Lead Channel Impedance Value: 490 Ohm
Lead Channel Pacing Threshold Amplitude: 0.5 V
Lead Channel Pacing Threshold Amplitude: 0.875 V
Lead Channel Pacing Threshold Pulse Width: 0.5 ms
Lead Channel Sensing Intrinsic Amplitude: 12 mV
Lead Channel Sensing Intrinsic Amplitude: 2.6 mV
Lead Channel Setting Pacing Amplitude: 0.75 V
Lead Channel Setting Sensing Sensitivity: 4 mV
MDC IDC LEAD IMPLANT DT: 20050816
MDC IDC LEAD LOCATION: 753859
MDC IDC MSMT LEADCHNL RA PACING THRESHOLD PULSEWIDTH: 0.5 ms
MDC IDC MSMT LEADCHNL RV IMPEDANCE VALUE: 740 Ohm
MDC IDC PG SERIAL: 3182792
MDC IDC SET LEADCHNL RA PACING AMPLITUDE: 1.875
MDC IDC SET LEADCHNL RV PACING PULSEWIDTH: 0.5 ms
MDC IDC STAT BRADY AP VP PERCENT: 29 %
MDC IDC STAT BRADY AS VP PERCENT: 71 %
MDC IDC STAT BRADY RA PERCENT PACED: 29 %

## 2017-12-21 ENCOUNTER — Ambulatory Visit (INDEPENDENT_AMBULATORY_CARE_PROVIDER_SITE_OTHER): Payer: Medicare HMO | Admitting: *Deleted

## 2017-12-21 DIAGNOSIS — Z5181 Encounter for therapeutic drug level monitoring: Secondary | ICD-10-CM

## 2017-12-21 DIAGNOSIS — Z952 Presence of prosthetic heart valve: Secondary | ICD-10-CM | POA: Diagnosis not present

## 2017-12-21 DIAGNOSIS — Z09 Encounter for follow-up examination after completed treatment for conditions other than malignant neoplasm: Secondary | ICD-10-CM

## 2017-12-21 LAB — POCT INR: INR: 3.1 — AB (ref 2.0–3.0)

## 2017-12-21 NOTE — Patient Instructions (Addendum)
  Description   Today take 1/2 tablet then continue on same dosage of 1 tablet every day. Recheck in 5 weeks. Call with any changes & when scheduled for colonoscopy or rt knee surgery [phone].

## 2018-01-25 ENCOUNTER — Ambulatory Visit (INDEPENDENT_AMBULATORY_CARE_PROVIDER_SITE_OTHER): Payer: Medicare HMO | Admitting: *Deleted

## 2018-01-25 DIAGNOSIS — Z952 Presence of prosthetic heart valve: Secondary | ICD-10-CM | POA: Diagnosis not present

## 2018-01-25 DIAGNOSIS — Z5181 Encounter for therapeutic drug level monitoring: Secondary | ICD-10-CM

## 2018-01-25 DIAGNOSIS — M25551 Pain in right hip: Secondary | ICD-10-CM | POA: Diagnosis not present

## 2018-01-25 DIAGNOSIS — Z09 Encounter for follow-up examination after completed treatment for conditions other than malignant neoplasm: Secondary | ICD-10-CM | POA: Diagnosis not present

## 2018-01-25 DIAGNOSIS — M5136 Other intervertebral disc degeneration, lumbar region: Secondary | ICD-10-CM | POA: Diagnosis not present

## 2018-01-25 LAB — POCT INR: INR: 2.6 (ref 2.0–3.0)

## 2018-01-25 NOTE — Patient Instructions (Signed)
Description   Continue on same dosage of 1 tablet every day. Recheck in 6 weeks. Call with any changes & when scheduled for colonoscopy or rt knee surgery [phone].

## 2018-01-26 ENCOUNTER — Other Ambulatory Visit: Payer: Self-pay | Admitting: Surgical

## 2018-01-26 ENCOUNTER — Telehealth: Payer: Self-pay | Admitting: Pharmacist

## 2018-01-26 DIAGNOSIS — M5136 Other intervertebral disc degeneration, lumbar region: Secondary | ICD-10-CM

## 2018-01-26 NOTE — Telephone Encounter (Signed)
Phone call to patient to verify medication list and allergies for myelogram procedure. Wife and husband on phone. Pt instructed to stop coumadin (pending cardiologist Dr Marlou Porch approval) 4 days prior to myelogram procedure and the Tramadol 48 hrs prior to myelogram procedure. Pt verbalized understanding. [place] hold request faxed to Dr Marlou Porch office, awaiting reply.

## 2018-01-26 NOTE — Telephone Encounter (Signed)
[name] wife called to report that pt is pending procedure with Dr. Gladstone Lighter for which he will need to hold his warfarin. She reports that the last time he did lovenox injections he had a hemorrhage so she hopes he will not need this in the future. She states that the procedure is not scheduled, but will be next week sometime. Per her report Dr. Charlestine Night office will be contacting our office for clearance. Will await procedure information.

## 2018-01-27 ENCOUNTER — Telehealth: Payer: Self-pay | Admitting: *Deleted

## 2018-01-27 NOTE — Telephone Encounter (Signed)
   Primary Cardiologist:Mark Marlou Porch, MD  Chart reviewed as part of pre-operative protocol coverage. Because of Jobin Montelongo Chavous's past medical history and time since last visit, he/she will require a follow-up visit in order to better assess preoperative cardiovascular risk.  Pre-op covering staff: - Please schedule appointment and call patient to inform them. - Please contact requesting surgeon's office via preferred method (i.e, phone, fax) to inform them of need for appointment prior to surgery.  Cecilie Kicks, NP  01/27/2018, 4:22 PM

## 2018-01-27 NOTE — Telephone Encounter (Signed)
   [facility] Pre-operative Risk Assessment    Request for surgical clearance:  1. What type of surgery is being performed? myelogram   2. When is this surgery scheduled? TBD   3. What type of clearance is required (medical clearance vs. Pharmacy clearance to hold med vs. Both)? Pharmacy  4. Are there any medications that need to be held prior to surgery and how long? Coumadin, 4 days  5. Practice name and name of physician performing surgery?  [facility]  6. What is your office phone number [phone]   7.   What is your office fax number [phone]  8.   Anesthesia type (None, local, MAC, general)  Not listed.   Rodman Key 01/27/2018, 12:29 PM  _________________________________________________________________   (provider comments below)

## 2018-01-30 NOTE — Telephone Encounter (Signed)
He may proceed with myelogram with low cardiac risk.  MECHANICAL AORTIC VALVE - please review instructions from [facility] in pharmacy. (4 day hold of coumadin - no bridge). Had bleeding complication post hernia repair with Lovenox. ACC guidelines allow for cessation of coumadin in this situation without bridge.   Candee Furbish, MD

## 2018-01-30 NOTE — Telephone Encounter (Signed)
Routed to [place].

## 2018-01-31 NOTE — Telephone Encounter (Signed)
See phone note from 01/27/18 for additional details.

## 2018-01-31 NOTE — Telephone Encounter (Signed)
See notes by Dr. Marlou Porch and Kelli Churn, RN. Make sure communication was received by requesting provider. This note will be removed from the preop pool. Richardson Dopp, PA-C    01/31/2018 4:52 PM

## 2018-02-08 ENCOUNTER — Ambulatory Visit (INDEPENDENT_AMBULATORY_CARE_PROVIDER_SITE_OTHER): Payer: Medicare HMO | Admitting: *Deleted

## 2018-02-08 ENCOUNTER — Encounter: Payer: Self-pay | Admitting: Cardiology

## 2018-02-08 DIAGNOSIS — I442 Atrioventricular block, complete: Secondary | ICD-10-CM

## 2018-02-08 NOTE — Progress Notes (Signed)
Remote pacemaker transmission.   

## 2018-02-13 ENCOUNTER — Ambulatory Visit
Admission: RE | Admit: 2018-02-13 | Discharge: 2018-02-13 | Disposition: A | Discharge status: Home / Self Care | Payer: Medicare HMO | Source: Ambulatory Visit | Attending: Surgical | Admitting: Surgical

## 2018-02-13 ENCOUNTER — Ambulatory Visit (INDEPENDENT_AMBULATORY_CARE_PROVIDER_SITE_OTHER): Payer: Medicare HMO | Admitting: *Deleted

## 2018-02-13 DIAGNOSIS — Z5181 Encounter for therapeutic drug level monitoring: Secondary | ICD-10-CM

## 2018-02-13 DIAGNOSIS — M5136 Other intervertebral disc degeneration, lumbar region: Secondary | ICD-10-CM

## 2018-02-13 DIAGNOSIS — M5126 Other intervertebral disc displacement, lumbar region: Secondary | ICD-10-CM | POA: Diagnosis not present

## 2018-02-13 DIAGNOSIS — Z09 Encounter for follow-up examination after completed treatment for conditions other than malignant neoplasm: Secondary | ICD-10-CM | POA: Diagnosis not present

## 2018-02-13 DIAGNOSIS — Z952 Presence of prosthetic heart valve: Secondary | ICD-10-CM | POA: Diagnosis not present

## 2018-02-13 LAB — POCT INR: INR: 1 — AB (ref 2.0–3.0)

## 2018-02-13 MED ORDER — DIAZEPAM 5 MG PO TABS
5.0000 mg | ORAL_TABLET | Freq: Once | ORAL | Status: AC
  Administered 2018-02-13: 5 mg via ORAL

## 2018-02-13 MED ORDER — ONDANSETRON HCL 4 MG/2ML IJ SOLN: 4.0000 mg | Freq: Four times a day (QID) | INTRAMUSCULAR | Status: DC | PRN

## 2018-02-13 MED ORDER — IOPAMIDOL (ISOVUE-M 200) INJECTION 41%
15.0000 mL | Freq: Once | INTRAMUSCULAR | Status: AC
  Administered 2018-02-13: 15 mL via INTRATHECAL

## 2018-02-13 NOTE — Patient Instructions (Signed)
Description   After your myelogram today resume your Coumadin at normal dose; which is 1 tablet every day. Recheck in 1week. Call with any changes & when scheduled for colonoscopy or rt knee surgery [phone].

## 2018-02-13 NOTE — Discharge Instructions (Signed)
Myelogram Discharge Instructions  1. Go home and rest quietly for the next 24 hours.  It is important to lie flat for the next 24 hours.  Get up only to go to the restroom.  You may lie in the bed or on a couch on your back, your stomach, your left side or your right side.  You may have one pillow under your head.  You may have pillows between your knees while you are on your side or under your knees while you are on your back.  2. DO NOT drive today.  Recline the seat as far back as it will go, while still wearing your seat belt, on the way home.  3. You may get up to go to the bathroom as needed.  You may sit up for 10 minutes to eat.  You may resume your normal diet and medications unless otherwise indicated.  Drink lots of extra fluids today and tomorrow.  4. The incidence of headache, nausea, or vomiting is about 5% (one in 20 patients).  If you develop a headache, lie flat and drink plenty of fluids until the headache goes away.  Caffeinated beverages may be helpful.  If you develop severe nausea and vomiting or a headache that does not go away with flat bed rest, call [phone].  5. You may resume normal activities after your 24 hours of bed rest is over; however, do not exert yourself strongly or do any heavy lifting tomorrow. If when you get up you have a headache when standing, go back to bed and force fluids for another 24 hours.  6. Call your physician for a follow-up appointment.  The results of your myelogram will be sent directly to your physician by the following day.  7. If you have any questions or if complications develop after you arrive home, please call [phone].  Discharge instructions have been explained to the patient.  The patient, or the person responsible for the patient, fully understands these instructions.  YOU MAY RESTART YOUR COUMADIN TODAY. YOU MAY RESTART YOUR TRAMADOL TOMORROW 02/14/2018 AT 1:00 PM.

## 2018-02-21 ENCOUNTER — Ambulatory Visit (INDEPENDENT_AMBULATORY_CARE_PROVIDER_SITE_OTHER): Payer: Medicare HMO

## 2018-02-21 DIAGNOSIS — Z09 Encounter for follow-up examination after completed treatment for conditions other than malignant neoplasm: Secondary | ICD-10-CM

## 2018-02-21 DIAGNOSIS — Z5181 Encounter for therapeutic drug level monitoring: Secondary | ICD-10-CM | POA: Diagnosis not present

## 2018-02-21 DIAGNOSIS — Z952 Presence of prosthetic heart valve: Secondary | ICD-10-CM

## 2018-02-21 LAB — POCT INR: INR: 1.3 — AB (ref 2.0–3.0)

## 2018-02-21 NOTE — Patient Instructions (Signed)
Please take extra 1/2 tablet today and tomorrow, then continue dosage of 1 tablet every day. Recheck in 1week. Call with any changes & when scheduled for colonoscopy or rt knee surgery [phone]

## 2018-02-22 ENCOUNTER — Telehealth: Payer: Self-pay | Admitting: *Deleted

## 2018-02-22 ENCOUNTER — Telehealth: Payer: Self-pay | Admitting: Cardiology

## 2018-02-22 DIAGNOSIS — M48061 Spinal stenosis, lumbar region without neurogenic claudication: Secondary | ICD-10-CM | POA: Diagnosis not present

## 2018-02-22 NOTE — Telephone Encounter (Signed)
Walk in pt Form-Surgical Clearance dropped off. Placed in Triage box to be addressed.

## 2018-02-22 NOTE — Telephone Encounter (Signed)
   [facility] Pre-operative Risk Assessment    Request for surgical clearance:  1. What type of surgery is being performed? Central Decompression Lumbar Laminectomy L5-S   2. When is this surgery scheduled? TBD   3. What type of clearance is required (medical clearance vs. Pharmacy clearance to hold med vs. Both)? Both  4. Are there any medications that need to be held prior to surgery and how long? None noted   5. Practice name and name of physician performing surgery?  [facility]. Dr. Gladstone Lighter   6. What is your office phone number - [phone]    7.   What is your office fax number -[phone]  8.   Anesthesia type (None, local, MAC, general) ? Not noted.    Please let me know if there are any contraindications or recommendations for the patient prior to surgery, during the procedure, or in the post-operative period _________________________________________________________________   (provider comments below)

## 2018-02-23 NOTE — Telephone Encounter (Signed)
Pt takes warfarin for mechanical AVR. Pt cleared 1 month ago for myelogram after discussion with Dr Marlou Porch, will clear this procedure with same anticoagulation instructions since there have been no acute changes. Recommend holding warfarin for 4 days prior to procedure with no Lovenox bridge. He has had bleeding complications in the past post hernia repair with Lovenox.

## 2018-02-27 NOTE — Telephone Encounter (Signed)
   Primary Cardiologist: Candee Furbish, MD  Chart reviewed as part of pre-operative protocol coverage. Patient was contacted 02/27/2018 in reference to pre-operative risk assessment for pending surgery as outlined below.  [name] was last seen 05/2017 with Dr. Marlou Porch. H/o Bentall aortic root replacement, St. Jude mechanical valve, CAD s/p CABGx2, CHB s/p PPM, HTN, HLD, GERD, DM, low risk nuc 2014. Has no underlying escape rhythm per notes.  Dr. Marlou Porch recently cleared him in a separate note for myelogram, with specific instructions for no bridging given prior bleeding issues. Given it's been >6 months since last visit I will route to Dr Marlou Porch to find out if he feels a visit would be helpful prior to clearing patient for back surgery. Dr. Marlou Porch - - Please route response to [facility] PREOP (the pre-op pool). Thank you.  Charlie Pitter, PA-C 02/27/2018, 4:26 PM

## 2018-02-28 ENCOUNTER — Encounter: Payer: Self-pay | Admitting: Cardiology

## 2018-02-28 ENCOUNTER — Ambulatory Visit: Payer: Medicare HMO | Admitting: *Deleted

## 2018-02-28 ENCOUNTER — Ambulatory Visit (INDEPENDENT_AMBULATORY_CARE_PROVIDER_SITE_OTHER): Payer: Medicare HMO | Admitting: Cardiology

## 2018-02-28 VITALS — BP 120/70 | HR 71 | Ht 66.5 in | Wt 214.0 lb

## 2018-02-28 DIAGNOSIS — Z09 Encounter for follow-up examination after completed treatment for conditions other than malignant neoplasm: Secondary | ICD-10-CM | POA: Diagnosis not present

## 2018-02-28 DIAGNOSIS — Z01810 Encounter for preprocedural cardiovascular examination: Secondary | ICD-10-CM | POA: Diagnosis not present

## 2018-02-28 DIAGNOSIS — Z5181 Encounter for therapeutic drug level monitoring: Secondary | ICD-10-CM | POA: Diagnosis not present

## 2018-02-28 DIAGNOSIS — I10 Essential (primary) hypertension: Secondary | ICD-10-CM | POA: Diagnosis not present

## 2018-02-28 DIAGNOSIS — Z952 Presence of prosthetic heart valve: Secondary | ICD-10-CM

## 2018-02-28 DIAGNOSIS — Z950 Presence of cardiac pacemaker: Secondary | ICD-10-CM | POA: Diagnosis not present

## 2018-02-28 LAB — POCT INR: INR: 2.2 (ref 2.0–3.0)

## 2018-02-28 NOTE — Progress Notes (Signed)
Cardiology Office Note   Date:  02/28/2018   ID:  [name], DOB [birth year], MRN [id number]  PCP:  Jeremy Lor, MD  Cardiologist:   Jeremy Furbish, MD       History of Present Illness: [name] is a 72 y.o. male former patient of Dr. Ron Whitney here for followup. Has coronary artery disease as well as pacemaker, and aortic valve disease.  Pacer ERI - changeout sched soon. Dr. [facility] stay 01/2016 - CP, neg trop. ECHO reassuring.  In 2014, echo showed normal LV function with mild inferior hypokinesis. Has a mechanical aortic valve working well. Mild mitral regurgitation. Nuclear stress test in 2014 also showed no ischemia.  Knee discomfort has been an issue. He was a Development worker, community for several years. Has lower extremity edema bilaterally, mostly on right leg. Overall doing well. Feels some mild shortness of breath.  Weight loss has been an issue for him.  No chest pain, no syncope, no bleeding.  Previously had a hernia surgery and while on Lovenox bridge protocol bled, had to go back to the hospital for 2 weeks.  05/25/17-I reviewed office note from 05/11/17 from Dr. Cristopher Whitney regarding his pacemaker and previous complete heart block.  He has no escape.  [place].  02/28/2018-overall he has been doing very well except for his back pain.  He is planning to undergo surgery.  He is here for preoperative risk assessment.  He has not been having any anginal symptoms, no significant shortness of breath.  His last stress test was within the 5-year window and was overall low risk with no ischemia.  Echocardiogram showed a normal functioning mechanical aortic valve previously.  Sharp S2 click noted on exam.  Note he has had prior bleeding with Lovenox bridge.  No prior stroke.  Past Medical History:  Diagnosis Date  . Acute renal failure ([place]) 02/09/2013  . Aortic stenosis 08/25/2010   a. Bentall aortic root replacement with a St. Jude mechanical valve and Hemashield  conduit 02/2004.  Marland Kitchen Arthritis   . Asthma   . AV BLOCK, COMPLETE    a. s/p St Jude dual chamber pacemaker 02/2004.  Marland Kitchen CAD (coronary artery disease)    a. s/p CABGx2 (LIMA-dLAD, SVG-Cx). b. Low risk nuc 12/2012 without ischemia, EF 46% mild apical hypokinesia (EF 55% inf HK by echo).  . Carotid artery disease ([place])    a. 0-39% bilateral ICA stenosis, stable mild hard plaque in carotid bulbs. F/u 03/2014 recommended.  . Diabetes mellitus without complication ([place])   . DIVERTICULOSIS, COLON 10/17/2007  . Ejection fraction    a. EF 55% with inf HK, mild MR by echo 12/2012.  Marland Kitchen GERD (gastroesophageal reflux disease)    hxof   . GOUT 01/16/2007  . HEMORRHOIDS, INTERNAL 11/01/2008  . HYPERLIPIDEMIA 01/16/2007  . HYPERTENSION 01/16/2007  . Impaired glucose tolerance   . LEG EDEMA 11/27/2008  . Special screening for malignant neoplasm of prostate   . Warfarin anticoagulation    AVR    Past Surgical History:  Procedure Laterality Date  . AORTIC VALVE REPLACEMENT    . CARDIAC CATHETERIZATION  06/2003  . CIRCUMCISION    . COLONOSCOPY    . CORONARY ARTERY BYPASS GRAFT    . coronary stents      prior to bypass   . EP IMPLANTABLE DEVICE N/A 05/06/2016   Procedure: PPM Generator Changeout;  Surgeon: Jeremy Lance, MD;  Location: [facility];  Service: Cardiovascular;  Laterality: N/A;  . ESOPHAGOGASTRODUODENOSCOPY    . HERNIA REPAIR    . INGUINAL HERNIA REPAIR Bilateral 02/07/2013   Procedure: LAPAROSCOPIC BILATERAL INGUINAL HERNIA REPAIR;  Surgeon: [name]. Jeremy Dover, MD;  Location: [facility];  Service: General;  Laterality: Bilateral;  . INSERTION OF MESH Bilateral 02/07/2013   Procedure: INSERTION OF MESH;  Surgeon: [name]. Jeremy Dover, MD;  Location: [facility];  Service: General;  Laterality: Bilateral;  . PACEMAKER INSERTION    . SHOULDER SURGERY Bilateral      Current Outpatient Medications  Medication Sig Dispense Refill  . ACCU-CHEK FASTCLIX LANCETS MISC USE TO CHECK BLOOD SUGAR TWICE A DAY AND  AS NEEDED 306 each 3  . acetaminophen (TYLENOL ARTHRITIS PAIN) 650 MG CR tablet Take 1,300 mg by mouth 2 (two) times daily.    Marland Kitchen albuterol (PROVENTIL HFA;VENTOLIN HFA) 108 (90 Base) MCG/ACT inhaler INHALE 2 PUFFS INTO THE LUNGS EVERY 6 (SIX) HOURS AS NEEDED FOR WHEEZING OR SHORTNESS OF BREATH. 54 g 3  . Ascorbic Acid (VITAMIN C) 500 MG tablet Take 1,000 mg by mouth daily.     Marland Kitchen aspirin 81 MG tablet Take 81 mg by mouth every evening.     Marland Kitchen atorvastatin (LIPITOR) 40 MG tablet TAKE 1 TABLET EVERY DAY AT 6 PM 90 tablet 2  . calcium carbonate (OS-CAL) 600 MG TABS Take 600 mg by mouth 2 (two) times daily with a meal.    . cetirizine (ZYRTEC) 10 MG tablet Take 10 mg by mouth daily.     . furosemide (LASIX) 40 MG tablet TAKE 1 TABLET TWICE DAILY 180 tablet 1  . Glucosamine-Chondroitin 250-200 MG CAPS Take 2 capsules by mouth daily.     Marland Kitchen glucose blood (ACCU-CHEK SMARTVIEW) test strip USE TO CHECK BLOOD SUGAR TWICE A DAY AND PRN 300 each 3  . guaiFENesin-dextromethorphan (ROBITUSSIN DM) 100-10 MG/5ML syrup Take 5 mLs by mouth every 4 (four) hours as needed for cough.    . losartan (COZAAR) 100 MG tablet TAKE 1 TABLET EVERY DAY 90 tablet 2  . metFORMIN (GLUCOPHAGE) 1000 MG tablet Take 1,000 mg by mouth 2 (two) times daily with a meal.    . metoprolol succinate (TOPROL-XL) 100 MG 24 hr tablet TAKE 1 TABLET EVERY MORNING WITH OR IMMEDIATELY FOLLOWING A MEAL 90 tablet 2  . Misc Natural Products (TART CHERRY ADVANCED) CAPS Take 2 capsules by mouth daily.     . Multiple Vitamin (MULTIVITAMIN) tablet Take 1 tablet by mouth daily.      . nitroGLYCERIN (NITROSTAT) 0.4 MG SL tablet Place 1 tablet (0.4 mg total) under the tongue every 5 (five) minutes as needed. For chest pain 25 tablet 3  . Omega-3 Fatty Acids (FISH OIL) 1200 MG CAPS Take 1,200 mg by mouth 2 (two) times daily.    . potassium chloride (K-DUR,KLOR-CON) 10 MEQ tablet Take 10 mEq by mouth 2 (two) times daily.    . Probiotic Product (PHILLIPS COLON  HEALTH PO) Take 1 capsule by mouth daily.    . traMADol (ULTRAM) 50 MG tablet Take 50 mg by mouth every 6 (six) hours as needed for pain.     . vitamin B-12 (CYANOCOBALAMIN) 1000 MCG tablet Take 1,000 mcg by mouth daily.    Marland Kitchen warfarin (COUMADIN) 10 MG tablet TAKE AS DIRECTED BY COUMADIN CLINIC 90 tablet 1   No current facility-administered medications for this visit.     Allergies:   Bee venom; Codeine phosphate; Metoprolol tartrate; and Ramipril    Social History:  The patient  reports that he quit smoking about 44 years ago. He has never used smokeless tobacco. He reports that he does not drink alcohol or use drugs. Retired Development worker, community    Family History:  The patient's family history includes Diabetes in his brother, brother, mother, and sister; Epilepsy in his sister; Heart attack in his father; Hypertension in his mother; Kidney cancer in his father.    ROS:  Please see the history of present illness.   Otherwise, review of systems are positive for none.   All other systems are reviewed and negative.    PHYSICAL EXAM: VS:  BP 120/70   Pulse 71   Ht 5' 6.5" (1.689 m)   Wt 214 lb (97.1 kg)   BMI 34.02 kg/m  , BMI Body mass index is 34.02 kg/m. GEN: Well nourished, well developed, in no acute distress  HEENT: normal  Neck: no JVD, carotid bruits, or masses Cardiac: S2 click RRR; no murmurs, rubs, or gallops,no edema  Respiratory:  clear to auscultation bilaterally, normal work of breathing GI: soft, nontender, nondistended, + BS MS: no deformity or atrophy  Skin: warm and dry, no rash Neuro:  Alert and Oriented x 3, Strength and sensation are intact Psych: euthymic mood, full affect     EKG: Today 02/28/2018- AV pacing 71 bpm personally viewed  Recent Labs: 10/11/2017: ALT 21; BUN 17; Creatinine, Ser 0.78; Hemoglobin 13.9; Platelets 195.0; Potassium 4.3; Sodium 139; TSH 2.16    Lipid Panel    Component Value Date/Time   CHOL 120 10/11/2017 0905   TRIG 109.0 10/11/2017  0905   TRIG 91 06/14/2006 0825   HDL 43.30 10/11/2017 0905   CHOLHDL 3 10/11/2017 0905   VLDL 21.8 10/11/2017 0905   LDLCALC 55 10/11/2017 0905      Wt Readings from Last 3 Encounters:  02/28/18 214 lb (97.1 kg)  10/11/17 226 lb (102.5 kg)  05/25/17 228 lb 8 oz (103.6 kg)    04/2014-vascular study, carotids Heterogeneous plaque, bilaterally. Stable, 1-39% bilateral ICA stenosis. Patent vertebral arteries with antegrade flow. Normal subclavian arteries, bilaterally. f/u 2 years.  Other studies Reviewed: ECHO 02/06/16: - Left ventricle: LVEF is approximately 55% with hypokinesis of the   distal inferior/inferoseptal walls. The cavity size was normal.   Wall thickness was normal. Doppler parameters are consistent with   abnormal left ventricular relaxation (grade 1 diastolic   dysfunction). - Aortic valve: AV prosthesis appears to move well Peak and mean   gradients through the valve are 16 and 10 mm Hg respectively.   There was trivial regurgitation. Valve area (VTI): 2.77 cm^2.   Valve area (Vmax): 2.63 cm^2. Valve area (Vmean): 2.55 cm^2. - Right ventricle: The cavity size was mildly dilated. Wall   thickness was normal. - Right atrium: The atrium was mildly dilated.  Additional studies/ records that were reviewed today include: Prior office notes reviewed, lab work reviewed, previous LDL 121. Review of the above records demonstrates: As above   ASSESSMENT AND PLAN:  Preoperative cardiac risk stratification -He may proceed with surgery with moderate overall cardiac risk based upon his prior bypass surgery and mechanical aortic valve.  We will have to coordinate with Coumadin clinic.  I discussed with pharmacy team and we will hold his Coumadin for 4 days prior to his surgery and then recommend that he resume his Coumadin, previous standard dosing of 10 mg as directed by Coumadin clinic, on night of surgery.  I would not bridge him with Lovenox.  He has had a prior bleed with  this approach, in addition he is having spinal surgery.  1.  Aortic stenosis-history of aortic valve replacement in 2005, Bentall, mechanical. Echo 2017 functioning well.  No changes made.  2. History of aortic valve replacement, mechanical-2005 as above. Functioning well. Echocardiogram reviewed.  Coumadin clinic.  Dental prophylaxis.  Stable.  3. Coronary artery disease-bypass surgery at the time of aortic valve replacement 2005. 2013 no ischemia on nuclear stress test. Secondary prevention.  4. Hyperlipidemia-previously changed from Pravachol to atorvastatin, guideline directed therapy. LDL goal 70. Dr. Beverely Risen has been checking labs.  Overall doing well. No Changes  5. Carotid artery disease-mild plaque previously. Stable disease. Mild plaque bilaterally.  Continue with secondary prevention  6. Pacemaker-change out 05/06/16 - Dr. Lovena Le. Visit reviewed.  No changes made.  Reminder, he does NOT have an escape rhythm.  7. Warfarin- anticoagulated for his aortic valve prosthesis.  Stable  8. Obesity-continue to encourage weight loss, decrease carbohydrates. Weight loss will help with his diabetes, taking metformin, diet controlled. This will also help with his lower extremity edema     Current medicines are reviewed at length with the patient today.  The patient does not have concerns regarding medicines.  The following changes have been made:  no change  Labs/ tests ordered today include: none  No orders of the defined types were placed in this encounter.    Disposition:   FU with Cecille Rubin in 6 months , Ilhan Madan in 1 year  Signed, Jeremy Furbish, MD  02/28/2018 5:17 PM    [facility] [place], [place], [place]  [address] Phone: [phone]; Fax: [phone]

## 2018-02-28 NOTE — Telephone Encounter (Signed)
Preoperative cardiac risk stratification -He may proceed with surgery with moderate overall cardiac risk based upon his prior bypass surgery and mechanical aortic valve.  We will have to coordinate with Coumadin clinic.  I discussed with pharmacy team and we will hold his Coumadin for 4 days prior to his surgery and then recommend that he resume his Coumadin, previous standard dosing of 10 mg as directed by Coumadin clinic, on night of surgery.  I would not bridge him with Lovenox.  He has had a prior bleed with this approach, in addition he is having spinal surgery. 02/28/2018 per Dr Candee Furbish

## 2018-02-28 NOTE — Telephone Encounter (Signed)
S/w pt is coming in today to address surgical clearance with Dr.Skains.

## 2018-02-28 NOTE — Telephone Encounter (Signed)
     Primary Cardiologist:Mark Marlou Porch, MD  Chart reviewed as part of pre-operative protocol coverage. Because of Samanyu Tinnell Ayo's past medical history and time since last visit, he/she will require a follow-up visit in order to better assess preoperative cardiovascular risk. This is per discussion with Dr. Marlou Porch.   Pre-op covering staff: - Please schedule appointment and call patient to inform them. - Please contact requesting surgeon's office via preferred method (i.e, phone, fax) to inform them of need for appointment prior to surgery.  Truitt Merle, NP  02/28/2018, 2:21 PM

## 2018-02-28 NOTE — Patient Instructions (Signed)
Description   Continue dosage of 1 tablet every day. Recheck in 3 weeks. Call with any changes & when scheduled for back surgery, # [phone].

## 2018-02-28 NOTE — Patient Instructions (Signed)
Medication Instructions:  The current medical regimen is effective;  continue present plan and medications.  Follow-Up: Follow up in 6 month with Truitt Merle, NP.  You will receive a letter in the mail 2 months before you are due.  Please call us when you receive this letter to schedule your follow up appointment.  Follow up in 1 year with Dr. Marlou Porch.  You will receive a letter in the mail 2 months before you are due.  Please call us when you receive this letter to schedule your follow up appointment.  If you need a refill on your cardiac medications before your next appointment, please call your pharmacy.  Thank you for choosing [facility]!!  Hold Coumadin 4 days before your surgery and restart Coumadin 10 mg the night of your surgery.

## 2018-03-01 NOTE — Addendum Note (Signed)
Addended by: Maren Beach, Kayin Kettering A on: 03/01/2018 04:26 PM   Modules accepted: Orders

## 2018-03-02 ENCOUNTER — Ambulatory Visit: Payer: Medicare HMO | Admitting: Adult Health

## 2018-03-07 LAB — CUP PACEART REMOTE DEVICE CHECK
Battery Remaining Longevity: 130 mo
Battery Remaining Percentage: 95.5 %
Battery Voltage: 2.99 V
Brady Statistic RA Percent Paced: 28 %
Brady Statistic RV Percent Paced: 99 %
Date Time Interrogation Session: 20190731103345
Implantable Lead Implant Date: 20050816
Implantable Lead Location: 753859
Lead Channel Impedance Value: 490 Ohm
Lead Channel Pacing Threshold Amplitude: 0.875 V
Lead Channel Pacing Threshold Pulse Width: 0.5 ms
Lead Channel Sensing Intrinsic Amplitude: 2.2 mV
Lead Channel Setting Pacing Amplitude: 0.75 V
MDC IDC LEAD IMPLANT DT: 20050816
MDC IDC LEAD LOCATION: 753860
MDC IDC MSMT LEADCHNL RV IMPEDANCE VALUE: 760 Ohm
MDC IDC MSMT LEADCHNL RV PACING THRESHOLD AMPLITUDE: 0.5 V
MDC IDC MSMT LEADCHNL RV PACING THRESHOLD PULSEWIDTH: 0.5 ms
MDC IDC MSMT LEADCHNL RV SENSING INTR AMPL: 12 mV
MDC IDC PG IMPLANT DT: 20171026
MDC IDC SET LEADCHNL RA PACING AMPLITUDE: 1.875
MDC IDC SET LEADCHNL RV PACING PULSEWIDTH: 0.5 ms
MDC IDC SET LEADCHNL RV SENSING SENSITIVITY: 4 mV
MDC IDC STAT BRADY AP VP PERCENT: 28 %
MDC IDC STAT BRADY AP VS PERCENT: 1 %
MDC IDC STAT BRADY AS VP PERCENT: 72 %
MDC IDC STAT BRADY AS VS PERCENT: 1 %
Pulse Gen Model: 2272
Pulse Gen Serial Number: 3182792

## 2018-03-08 NOTE — Patient Instructions (Signed)
[name]  03/08/2018   Your procedure is scheduled on: 03/15/2018   Report to [facility] Main  Entrance  Report to admitting at   0600 AM    Call this number if you have problems the morning of surgery [phone]   Remember: Do not eat food or drink liquids :After Midnight.     Take these medicines the morning of surgery with A SIP OF WATER:Inhalers as usual and bring, Zyrtec, toprol   DO NOT TAKE ANY DIABETIC MEDICATIONS DAY OF YOUR SURGERY                               You may not have any metal on your body including hair pins and              piercings  Do not wear jewelry,  lotions, powders or perfumes, deodorant                         Men may shave face and neck.   Do not bring valuables to the hospital. [place].  Contacts, dentures or bridgework may not be worn into surgery.  Leave suitcase in the car. After surgery it may be brought to your room.                       Please read over the following fact sheets you were given: _____________________________________________________________________             [facility] - Preparing for Surgery Before surgery, you can play an important role.  Because skin is not sterile, your skin needs to be as free of germs as possible.  You can reduce the number of germs on your skin by washing with CHG (chlorahexidine gluconate) soap before surgery.  CHG is an antiseptic cleaner which kills germs and bonds with the skin to continue killing germs even after washing. Please DO NOT use if you have an allergy to CHG or antibacterial soaps.  If your skin becomes reddened/irritated stop using the CHG and inform your nurse when you arrive at Short Stay. Do not shave (including legs and underarms) for at least 48 hours prior to the first CHG shower.  You may shave your face/neck. Please follow these instructions carefully:  1.  Shower with CHG Soap the night  before surgery and the  morning of Surgery.  2.  If you choose to wash your hair, wash your hair first as usual with your  normal  shampoo.  3.  After you shampoo, rinse your hair and body thoroughly to remove the  shampoo.                           4.  Use CHG as you would any other liquid soap.  You can apply chg directly  to the skin and wash                       Gently with a scrungie or clean washcloth.  5.  Apply the CHG Soap to your body ONLY FROM THE NECK DOWN.   Do not use on face/ open  Wound or open sores. Avoid contact with eyes, ears mouth and genitals (private parts).                       Wash face,  Genitals (private parts) with your normal soap.             6.  Wash thoroughly, paying special attention to the area where your surgery  will be performed.  7.  Thoroughly rinse your body with warm water from the neck down.  8.  DO NOT shower/wash with your normal soap after using and rinsing off  the CHG Soap.                9.  Pat yourself dry with a clean towel.            10.  Wear clean pajamas.            11.  Place clean sheets on your bed the night of your first shower and do not  sleep with pets. Day of Surgery : Do not apply any lotions/deodorants the morning of surgery.  Please wear clean clothes to the hospital/surgery center.  FAILURE TO FOLLOW THESE INSTRUCTIONS MAY RESULT IN THE CANCELLATION OF YOUR SURGERY PATIENT SIGNATURE_________________________________  NURSE SIGNATURE__________________________________  ________________________________________________________________________   [name]  An incentive spirometer is a tool that can help keep your lungs clear and active. This tool measures how well you are filling your lungs with each breath. Taking long deep breaths may help reverse or decrease the chance of developing breathing (pulmonary) problems (especially infection) following:  A long period of time when you are  unable to move or be active. BEFORE THE PROCEDURE   If the spirometer includes an indicator to show your best effort, your nurse or respiratory therapist will set it to a desired goal.  If possible, sit up straight or lean slightly forward. Try not to slouch.  Hold the incentive spirometer in an upright position. INSTRUCTIONS FOR USE  1. Sit on the edge of your bed if possible, or sit up as far as you can in bed or on a chair. 2. Hold the incentive spirometer in an upright position. 3. Breathe out normally. 4. Place the mouthpiece in your mouth and seal your lips tightly around it. 5. Breathe in slowly and as deeply as possible, raising the piston or the ball toward the top of the column. 6. Hold your breath for 3-5 seconds or for as long as possible. Allow the piston or ball to fall to the bottom of the column. 7. Remove the mouthpiece from your mouth and breathe out normally. 8. Rest for a few seconds and repeat Steps 1 through 7 at least 10 times every 1-2 hours when you are awake. Take your time and take a few normal breaths between deep breaths. 9. The spirometer may include an indicator to show your best effort. Use the indicator as a goal to work toward during each repetition. 10. After each set of 10 deep breaths, practice coughing to be sure your lungs are clear. If you have an incision (the cut made at the time of surgery), support your incision when coughing by placing a pillow or rolled up towels firmly against it. Once you are able to get out of bed, walk around indoors and cough well. You may stop using the incentive spirometer when instructed by your caregiver.  RISKS AND COMPLICATIONS  Take your time so you do not get  dizzy or light-headed.  If you are in pain, you may need to take or ask for pain medication before doing incentive spirometry. It is harder to take a deep breath if you are having pain. AFTER USE  Rest and breathe slowly and easily.  It can be helpful to  keep track of a log of your progress. Your caregiver can provide you with a simple table to help with this. If you are using the spirometer at home, follow these instructions: [place] IF:   You are having difficultly using the spirometer.  You have trouble using the spirometer as often as instructed.  Your pain medication is not giving enough relief while using the spirometer.  You develop fever of 100.5 F (38.1 C) or higher. SEEK IMMEDIATE MEDICAL CARE IF:   You cough up bloody sputum that had not been present before.  You develop fever of 102 F (38.9 C) or greater.  You develop worsening pain at or near the incision site. MAKE SURE YOU:   Understand these instructions.  Will watch your condition.  Will get help right away if you are not doing well or get worse. Document Released: 11/08/2006 Document Revised: 09/20/2011 Document Reviewed: 01/09/2007 [facility] Patient Information 2014 [place], [place].   ________________________________________________________________________

## 2018-03-09 ENCOUNTER — Encounter (HOSPITAL_COMMUNITY)
Admission: RE | Admit: 2018-03-09 | Discharge: 2018-03-09 | Disposition: A | Discharge status: Home / Self Care | Payer: Medicare HMO | Source: Ambulatory Visit | Attending: Orthopedic Surgery | Admitting: Orthopedic Surgery

## 2018-03-09 ENCOUNTER — Other Ambulatory Visit: Payer: Self-pay

## 2018-03-09 ENCOUNTER — Encounter (HOSPITAL_COMMUNITY): Payer: Self-pay

## 2018-03-09 ENCOUNTER — Ambulatory Visit (HOSPITAL_COMMUNITY)
Admission: RE | Admit: 2018-03-09 | Discharge: 2018-03-09 | Disposition: A | Discharge status: Home / Self Care | Payer: Medicare HMO | Source: Ambulatory Visit | Attending: Surgical | Admitting: Surgical

## 2018-03-09 DIAGNOSIS — M5126 Other intervertebral disc displacement, lumbar region: Secondary | ICD-10-CM | POA: Insufficient documentation

## 2018-03-09 DIAGNOSIS — M48061 Spinal stenosis, lumbar region without neurogenic claudication: Secondary | ICD-10-CM | POA: Diagnosis not present

## 2018-03-09 DIAGNOSIS — Z01818 Encounter for other preprocedural examination: Secondary | ICD-10-CM | POA: Insufficient documentation

## 2018-03-09 DIAGNOSIS — M545 Low back pain, unspecified: Secondary | ICD-10-CM

## 2018-03-09 DIAGNOSIS — M47816 Spondylosis without myelopathy or radiculopathy, lumbar region: Secondary | ICD-10-CM | POA: Insufficient documentation

## 2018-03-09 DIAGNOSIS — Z01812 Encounter for preprocedural laboratory examination: Secondary | ICD-10-CM | POA: Diagnosis not present

## 2018-03-09 HISTORY — DX: Headache: R51

## 2018-03-09 HISTORY — DX: Headache, unspecified: R51.9

## 2018-03-09 LAB — COMPREHENSIVE METABOLIC PANEL
ALT: 24 U/L (ref 0–44)
AST: 28 U/L (ref 15–41)
Albumin: 4.1 g/dL (ref 3.5–5.0)
Alkaline Phosphatase: 41 U/L (ref 38–126)
Anion gap: 7 (ref 5–15)
BUN: 13 mg/dL (ref 8–23)
CO2: 29 mmol/L (ref 22–32)
Calcium: 9.1 mg/dL (ref 8.9–10.3)
Chloride: 105 mmol/L (ref 98–111)
Creatinine, Ser: 0.79 mg/dL (ref 0.61–1.24)
GFR calc Af Amer: >60 mL/min (ref >60)
GFR calc non Af Amer: >60 mL/min (ref >60)
Glucose, Bld: 111 mg/dL — ABNORMAL HIGH (ref 70–99)
Potassium: 4.6 mmol/L (ref 3.5–5.1)
Sodium: 141 mmol/L (ref 135–145)
Total Bilirubin: 0.8 mg/dL (ref 0.3–1.2)
Total Protein: 6.5 g/dL (ref 6.5–8.1)

## 2018-03-09 LAB — CBC WITH DIFFERENTIAL/PLATELET
Basophils Absolute: 0 10*3/uL (ref 0.0–0.1)
Basophils Relative: 0 %
Eosinophils Absolute: 0.1 10*3/uL (ref 0.0–0.7)
Eosinophils Relative: 1 %
HCT: 43.8 % (ref 39.0–52.0)
Hemoglobin: 14.4 g/dL (ref 13.0–17.0)
Lymphocytes Relative: 23 %
Lymphs Abs: 1.7 10*3/uL (ref 0.7–4.0)
MCH: 29.2 pg (ref 26.0–34.0)
MCHC: 32.9 g/dL (ref 30.0–36.0)
MCV: 88.8 fL (ref 78.0–100.0)
Monocytes Absolute: 0.6 10*3/uL (ref 0.1–1.0)
Monocytes Relative: 7 %
Neutro Abs: 5.3 10*3/uL (ref 1.7–7.7)
Neutrophils Relative %: 69 %
Platelets: 198 10*3/uL (ref 150–400)
RBC: 4.93 MIL/uL (ref 4.22–5.81)
RDW: 15.1 % (ref 11.5–15.5)
WBC: 7.7 10*3/uL (ref 4.0–10.5)

## 2018-03-09 LAB — HEMOGLOBIN A1C
Hgb A1c MFr Bld: 6 % — ABNORMAL HIGH (ref 4.8–5.6)
Mean Plasma Glucose: 125.5 mg/dL

## 2018-03-09 LAB — GLUCOSE, CAPILLARY: GLUCOSE-CAPILLARY: 83 mg/dL (ref 70–99)

## 2018-03-09 LAB — APTT: aPTT: 37 seconds — ABNORMAL HIGH (ref 24–36)

## 2018-03-09 LAB — PROTIME-INR
INR: 2.46
Prothrombin Time: 26.5 seconds — ABNORMAL HIGH (ref 11.4–15.2)

## 2018-03-09 LAB — SURGICAL PCR SCREEN
MRSA, PCR: NEGATIVE
STAPHYLOCOCCUS AUREUS: NEGATIVE

## 2018-03-09 NOTE — Progress Notes (Signed)
EKG-03/01/18-epic. Dr Marlou Porch- 02/28/2018- preop clearance- epic.

## 2018-03-09 NOTE — Progress Notes (Signed)
PT/INR done 03/09/18 faxed via epic to Dr Gladstone Lighter.  Will repeat am of surgery.  Order in epic.,

## 2018-03-14 MED ORDER — BUPIVACAINE LIPOSOME 1.3 % IJ SUSP
20.0000 mL | INTRAMUSCULAR | Status: DC
  Filled 2018-03-14: qty 20

## 2018-03-14 NOTE — H&P (Signed)
[name] is an 72 y.o. male.   Chief Complaint: low back pain HPI: [name] presented with the chief complaint of right hip pain. He says his hip has been giving him trouble for about 2 months. No known injury. Pain is 6/10. He is taking tramadol for pain which he says helps along with tylenol. Aching pain in the hip. Certain positions will sometimes help with the pain but not for long. Hard to bend over and do any work due to the pain. He says he is interested in getting a shot in his hip because they help with his knee pain. He denies groin pain. He reports long history of low back pain. He is having pain in the right lower lumbar area that radiates into the buttocks and down to the calf. He has some numbness in the right foot. He noticed the pain about 2 months ago after bending over struggling to put on a compression stocking. No change in bladder or bowel function. CT myelogram showed severe spinal stenosis and a disc herniation at L4-L5 on the right.   Past Medical History:  Diagnosis Date  . Acute renal failure ([place]) 02/09/2013  . Aortic stenosis 08/25/2010   a. Bentall aortic root replacement with a St. Jude mechanical valve and Hemashield conduit 02/2004.  Marland Kitchen Arthritis   . Asthma   . AV BLOCK, COMPLETE    a. s/p St Jude dual chamber pacemaker 02/2004.  Marland Kitchen CAD (coronary artery disease)    a. s/p CABGx2 (LIMA-dLAD, SVG-Cx). b. Low risk nuc 12/2012 without ischemia, EF 46% mild apical hypokinesia (EF 55% inf HK by echo).  . Carotid artery disease ([place])    a. 0-39% bilateral ICA stenosis, stable mild hard plaque in carotid bulbs. F/u 03/2014 recommended.  . Diabetes mellitus without complication ([place])    type 2   . DIVERTICULOSIS, COLON 10/17/2007  . Ejection fraction    a. EF 55% with inf HK, mild MR by echo 12/2012.  Marland Kitchen GERD (gastroesophageal reflux disease)    hxof   . GOUT 01/16/2007  . Headache    hx of migraines   . HEMORRHOIDS, INTERNAL 11/01/2008  . HYPERLIPIDEMIA 01/16/2007  .  HYPERTENSION 01/16/2007  . Impaired glucose tolerance   . LEG EDEMA 11/27/2008  . Special screening for malignant neoplasm of prostate   . Warfarin anticoagulation    AVR    Past Surgical History:  Procedure Laterality Date  . AORTIC VALVE REPLACEMENT    . CARDIAC CATHETERIZATION  06/2003  . CIRCUMCISION    . COLONOSCOPY    . CORONARY ARTERY BYPASS GRAFT    . coronary stents      prior to bypass   . EP IMPLANTABLE DEVICE N/A 05/06/2016   Procedure: PPM Generator Changeout;  Surgeon: Evans Lance, MD;  Location: [facility];  Service: Cardiovascular;  Laterality: N/A;  . ESOPHAGOGASTRODUODENOSCOPY    . HERNIA REPAIR    . INGUINAL HERNIA REPAIR Bilateral 02/07/2013   Procedure: LAPAROSCOPIC BILATERAL INGUINAL HERNIA REPAIR;  Surgeon: Imogene Burn. Georgette Dover, MD;  Location: [facility];  Service: General;  Laterality: Bilateral;  . INSERTION OF MESH Bilateral 02/07/2013   Procedure: INSERTION OF MESH;  Surgeon: Imogene Burn. Georgette Dover, MD;  Location: [facility];  Service: General;  Laterality: Bilateral;  . PACEMAKER INSERTION    . SHOULDER SURGERY Bilateral     Current Facility-Administered Medications:  .  [START ON 03/15/2018] bupivacaine liposome (EXPAREL) 1.3 % injection 266 mg, 20 mL, Infiltration, On  Call to OR, [name], MD  Current Outpatient Medications:  .  acetaminophen (TYLENOL ARTHRITIS PAIN) 650 MG CR tablet, Take 1,300 mg by mouth 2 (two) times daily., Disp: , Rfl:  .  albuterol (PROVENTIL HFA;VENTOLIN HFA) 108 (90 Base) MCG/ACT inhaler, INHALE 2 PUFFS INTO THE LUNGS EVERY 6 (SIX) HOURS AS NEEDED FOR WHEEZING OR SHORTNESS OF BREATH., Disp: 54 g, Rfl: 3 .  Ascorbic Acid (VITAMIN C) 500 MG tablet, Take 1,000 mg by mouth daily. , Disp: , Rfl:  .  aspirin 81 MG tablet, Take 81 mg by mouth every evening. , Disp: , Rfl:  .  atorvastatin (LIPITOR) 40 MG tablet, TAKE 1 TABLET EVERY DAY AT 6 PM (Patient taking differently: Take 40 mg by mouth every evening. ), Disp: 90 tablet, Rfl: 2 .   calcium carbonate (OS-CAL) 600 MG TABS, Take 600 mg by mouth 2 (two) times daily with a meal., Disp: , Rfl:  .  cetirizine (ZYRTEC) 10 MG tablet, Take 10 mg by mouth daily. , Disp: , Rfl:  .  furosemide (LASIX) 40 MG tablet, TAKE 1 TABLET TWICE DAILY (Patient taking differently: Take 40 mg by mouth daily. ), Disp: 180 tablet, Rfl: 1 .  Glucosamine-Chondroitin 250-200 MG CAPS, Take 2 capsules by mouth daily. , Disp: , Rfl:  .  guaiFENesin-dextromethorphan (ROBITUSSIN DM) 100-10 MG/5ML syrup, Take 5 mLs by mouth every 4 (four) hours as needed for cough., Disp: , Rfl:  .  losartan (COZAAR) 100 MG tablet, TAKE 1 TABLET EVERY DAY (Patient taking differently: Take 100 mg by mouth every evening. ), Disp: 90 tablet, Rfl: 2 .  metFORMIN (GLUCOPHAGE) 1000 MG tablet, Take 1,000 mg by mouth 2 (two) times daily with a meal., Disp: , Rfl:  .  metoprolol succinate (TOPROL-XL) 100 MG 24 hr tablet, TAKE 1 TABLET EVERY MORNING WITH OR IMMEDIATELY FOLLOWING A MEAL (Patient taking differently: Take 100 mg by mouth daily. ), Disp: 90 tablet, Rfl: 2 .  Misc Natural Products (TART CHERRY ADVANCED) CAPS, Take 2 capsules by mouth daily. , Disp: , Rfl:  .  Multiple Vitamin (MULTIVITAMIN) tablet, Take 1 tablet by mouth daily.  , Disp: , Rfl:  .  nitroGLYCERIN (NITROSTAT) 0.4 MG SL tablet, Place 1 tablet (0.4 mg total) under the tongue every 5 (five) minutes as needed. For chest pain, Disp: 25 tablet, Rfl: 3 .  Omega-3 Fatty Acids (FISH OIL) 1200 MG CAPS, Take 1,200 mg by mouth daily. , Disp: , Rfl:  .  potassium chloride (K-DUR,KLOR-CON) 10 MEQ tablet, Take 10 mEq by mouth daily. , Disp: , Rfl:  .  Probiotic Product (PHILLIPS COLON HEALTH PO), Take 1 capsule by mouth daily., Disp: , Rfl:  .  traMADol (ULTRAM) 50 MG tablet, Take 50 mg by mouth every 6 (six) hours as needed for pain. , Disp: , Rfl:  .  vitamin B-12 (CYANOCOBALAMIN) 1000 MCG tablet, Take 1,000 mcg by mouth daily., Disp: , Rfl:  .  warfarin (COUMADIN) 10 MG  tablet, TAKE AS DIRECTED BY COUMADIN CLINIC (Patient taking differently: Take 10 mg by mouth daily with supper. ), Disp: 90 tablet, Rfl: 1 .  ACCU-CHEK FASTCLIX LANCETS MISC, USE TO CHECK BLOOD SUGAR TWICE A DAY AND AS NEEDED, Disp: 306 each, Rfl: 3 .  glucose blood (ACCU-CHEK SMARTVIEW) test strip, USE TO CHECK BLOOD SUGAR TWICE A DAY AND PRN, Disp: 300 each, Rfl: 3  Family History  Problem Relation Age of Onset  . Heart attack Father   . Kidney  cancer Father   . Hypertension Mother   . Diabetes Mother   . Diabetes Brother   . Epilepsy Sister   . Diabetes Sister   . Diabetes Brother    Social History:  reports that he quit smoking about 44 years ago. He has never used smokeless tobacco. He reports that he does not drink alcohol or use drugs.  Allergies:  Allergies  Allergen Reactions  . Bee Venom Shortness Of Breath and Swelling  . Codeine Phosphate Hives, Shortness Of Breath, Itching, Rash and Other (See Comments)  . Metoprolol Tartrate Other (See Comments)    loss of vision. Can take the XL tablet not regular   . Ramipril Rash and Cough    Review of Systems  Constitutional: Negative.   HENT: Positive for hearing loss. Negative for congestion, ear discharge, ear pain, nosebleeds, sinus pain, sore throat and tinnitus.   Eyes: Negative.   Respiratory: Negative.  Negative for stridor.   Cardiovascular: Negative.   Gastrointestinal: Negative.   Genitourinary: Negative.   Musculoskeletal: Positive for back pain, joint pain and myalgias. Negative for falls and neck pain.  Skin: Negative.   Neurological: Positive for sensory change and weakness. Negative for dizziness, tingling, tremors, speech change, focal weakness, seizures, loss of consciousness and headaches.  Endo/Heme/Allergies: Negative.   Psychiatric/Behavioral: Negative.     Physical Exam  Constitutional: He is oriented to person, place, and time. He appears well-developed. No distress.  Morbidly obese  HENT:  Head:  Normocephalic and atraumatic.  Right Ear: External ear normal.  Left Ear: External ear normal.  Nose: Nose normal.  Mouth/Throat: Oropharynx is clear and moist.  Eyes: Conjunctivae and EOM are normal.  Neck: Normal range of motion. Neck supple.  Cardiovascular: Normal rate, regular rhythm and intact distal pulses.  Murmur heard. Respiratory: Effort normal and breath sounds normal. No respiratory distress. He has no wheezes.  GI: Soft. Bowel sounds are normal. He exhibits no distension. There is no tenderness.  Musculoskeletal:       Right hip: Normal.       Left hip: Normal.       Lumbar back: He exhibits pain and spasm.  Neurological: He is alert and oriented to person, place, and time.  Sensation to light touch is decreased in the L5 nerve distribution of the right foot along the first web space. Motor strength 3/5 in the dorsiflexors of the right LE and 2/5 of the EHL. Otherwise 5/5.  Skin: No rash noted. No erythema.  Psychiatric: He has a normal mood and affect. His behavior is normal.    Ht 5 ft 7.75 in Wt 227 lbs BMI 34.8 BP 135 / 85 HR 76 bpm  Assessment/Plan Lumbar spinal stenosis L4-L5 and disc herniation L4-L5 right [name] has lumbar spinal stenosis as well as a disc herniation at L4-L5. He needs a lumbar central decompressive laminectomy and microdiscectomy L4-L5 on the right. He has received cardiac clearance. He will stay overnight for observation. Risks and benefits of the procedure discussed with the patient and his family by Dr. Gladstone Lighter.   H&P performed by Dr. [name], MD Documented by Ardeen Jourdain, [name]   [name], [place], [name] 03/14/2018, 12:08 PM

## 2018-03-15 ENCOUNTER — Observation Stay (HOSPITAL_COMMUNITY)
Admission: RE | Admit: 2018-03-15 | Discharge: 2018-03-16 | Disposition: A | Discharge status: Home / Self Care | Payer: Medicare HMO | Source: Ambulatory Visit | Attending: Orthopedic Surgery | Admitting: Orthopedic Surgery

## 2018-03-15 ENCOUNTER — Ambulatory Visit (HOSPITAL_COMMUNITY): Payer: Medicare HMO

## 2018-03-15 ENCOUNTER — Other Ambulatory Visit: Payer: Self-pay

## 2018-03-15 ENCOUNTER — Ambulatory Visit (HOSPITAL_COMMUNITY): Payer: Medicare HMO | Admitting: Certified Registered Nurse Anesthetist

## 2018-03-15 ENCOUNTER — Encounter (HOSPITAL_COMMUNITY)
Admission: RE | Disposition: A | Discharge status: Home / Self Care | Payer: Self-pay | Source: Ambulatory Visit | Attending: Orthopedic Surgery

## 2018-03-15 ENCOUNTER — Encounter (HOSPITAL_COMMUNITY): Payer: Self-pay | Admitting: *Deleted

## 2018-03-15 DIAGNOSIS — Z419 Encounter for procedure for purposes other than remedying health state, unspecified: Secondary | ICD-10-CM

## 2018-03-15 DIAGNOSIS — Z7901 Long term (current) use of anticoagulants: Secondary | ICD-10-CM | POA: Insufficient documentation

## 2018-03-15 DIAGNOSIS — Z888 Allergy status to other drugs, medicaments and biological substances status: Secondary | ICD-10-CM | POA: Diagnosis not present

## 2018-03-15 DIAGNOSIS — I6523 Occlusion and stenosis of bilateral carotid arteries: Secondary | ICD-10-CM | POA: Insufficient documentation

## 2018-03-15 DIAGNOSIS — M21371 Foot drop, right foot: Secondary | ICD-10-CM | POA: Diagnosis not present

## 2018-03-15 DIAGNOSIS — E785 Hyperlipidemia, unspecified: Secondary | ICD-10-CM | POA: Diagnosis not present

## 2018-03-15 DIAGNOSIS — M5126 Other intervertebral disc displacement, lumbar region: Secondary | ICD-10-CM | POA: Diagnosis not present

## 2018-03-15 DIAGNOSIS — J45909 Unspecified asthma, uncomplicated: Secondary | ICD-10-CM | POA: Insufficient documentation

## 2018-03-15 DIAGNOSIS — Z885 Allergy status to narcotic agent status: Secondary | ICD-10-CM | POA: Diagnosis not present

## 2018-03-15 DIAGNOSIS — Z950 Presence of cardiac pacemaker: Secondary | ICD-10-CM | POA: Insufficient documentation

## 2018-03-15 DIAGNOSIS — M48062 Spinal stenosis, lumbar region with neurogenic claudication: Principal | ICD-10-CM | POA: Diagnosis present

## 2018-03-15 DIAGNOSIS — Z91030 Bee allergy status: Secondary | ICD-10-CM | POA: Diagnosis not present

## 2018-03-15 DIAGNOSIS — M109 Gout, unspecified: Secondary | ICD-10-CM | POA: Insufficient documentation

## 2018-03-15 DIAGNOSIS — I350 Nonrheumatic aortic (valve) stenosis: Secondary | ICD-10-CM | POA: Insufficient documentation

## 2018-03-15 DIAGNOSIS — Z951 Presence of aortocoronary bypass graft: Secondary | ICD-10-CM | POA: Diagnosis not present

## 2018-03-15 DIAGNOSIS — I10 Essential (primary) hypertension: Secondary | ICD-10-CM | POA: Diagnosis not present

## 2018-03-15 DIAGNOSIS — E119 Type 2 diabetes mellitus without complications: Secondary | ICD-10-CM | POA: Diagnosis not present

## 2018-03-15 DIAGNOSIS — Z79899 Other long term (current) drug therapy: Secondary | ICD-10-CM | POA: Insufficient documentation

## 2018-03-15 DIAGNOSIS — Z7984 Long term (current) use of oral hypoglycemic drugs: Secondary | ICD-10-CM | POA: Insufficient documentation

## 2018-03-15 DIAGNOSIS — Z7982 Long term (current) use of aspirin: Secondary | ICD-10-CM | POA: Diagnosis not present

## 2018-03-15 DIAGNOSIS — Z87891 Personal history of nicotine dependence: Secondary | ICD-10-CM | POA: Insufficient documentation

## 2018-03-15 DIAGNOSIS — I442 Atrioventricular block, complete: Secondary | ICD-10-CM | POA: Diagnosis not present

## 2018-03-15 DIAGNOSIS — Z981 Arthrodesis status: Secondary | ICD-10-CM | POA: Diagnosis not present

## 2018-03-15 DIAGNOSIS — Z952 Presence of prosthetic heart valve: Secondary | ICD-10-CM | POA: Diagnosis not present

## 2018-03-15 DIAGNOSIS — I2510 Atherosclerotic heart disease of native coronary artery without angina pectoris: Secondary | ICD-10-CM | POA: Insufficient documentation

## 2018-03-15 DIAGNOSIS — M1990 Unspecified osteoarthritis, unspecified site: Secondary | ICD-10-CM | POA: Diagnosis not present

## 2018-03-15 DIAGNOSIS — M48061 Spinal stenosis, lumbar region without neurogenic claudication: Secondary | ICD-10-CM | POA: Diagnosis not present

## 2018-03-15 HISTORY — PX: LUMBAR LAMINECTOMY/DECOMPRESSION MICRODISCECTOMY: SHX5026

## 2018-03-15 LAB — GLUCOSE, CAPILLARY
GLUCOSE-CAPILLARY: 97 mg/dL (ref 70–99)
Glucose-Capillary: 105 mg/dL — ABNORMAL HIGH (ref 70–99)
Glucose-Capillary: 128 mg/dL — ABNORMAL HIGH (ref 70–99)
Glucose-Capillary: 154 mg/dL — ABNORMAL HIGH (ref 70–99)

## 2018-03-15 LAB — PROTIME-INR
INR: 1.03
Prothrombin Time: 13.4 seconds (ref 11.4–15.2)

## 2018-03-15 SURGERY — LUMBAR LAMINECTOMY/DECOMPRESSION MICRODISCECTOMY
Anesthesia: General | Site: Back

## 2018-03-15 MED ORDER — FLEET ENEMA 7-19 GM/118ML RE ENEM: 1.0000 | ENEMA | Freq: Once | RECTAL | Status: DC | PRN

## 2018-03-15 MED ORDER — INSULIN ASPART 100 UNIT/ML ~~LOC~~ SOLN
0.0000 [IU] | Freq: Three times a day (TID) | SUBCUTANEOUS | Status: DC
  Administered 2018-03-15 – 2018-03-16 (×2): 2 [IU] via SUBCUTANEOUS
  Administered 2018-03-16: 3 [IU] via SUBCUTANEOUS

## 2018-03-15 MED ORDER — BISACODYL 5 MG PO TBEC: 5.0000 mg | DELAYED_RELEASE_TABLET | Freq: Every day | ORAL | Status: DC | PRN

## 2018-03-15 MED ORDER — SODIUM CHLORIDE 0.9 % IV SOLN
INTRAVENOUS | Status: AC
  Filled 2018-03-15: qty 500000

## 2018-03-15 MED ORDER — ROCURONIUM BROMIDE 10 MG/ML (PF) SYRINGE
PREFILLED_SYRINGE | INTRAVENOUS | Status: DC | PRN
  Administered 2018-03-15: 70 mg via INTRAVENOUS
  Administered 2018-03-15: 10 mg via INTRAVENOUS

## 2018-03-15 MED ORDER — PHENYLEPHRINE HCL 10 MG/ML IJ SOLN
INTRAMUSCULAR | Status: AC
  Filled 2018-03-15: qty 2

## 2018-03-15 MED ORDER — METHOCARBAMOL 500 MG IVPB - SIMPLE MED
500.0000 mg | Freq: Four times a day (QID) | INTRAVENOUS | Status: DC | PRN
  Administered 2018-03-15: 500 mg via INTRAVENOUS
  Filled 2018-03-15: qty 50

## 2018-03-15 MED ORDER — LACTATED RINGERS IV SOLN
INTRAVENOUS | Status: DC
  Administered 2018-03-15: 07:00:00 via INTRAVENOUS

## 2018-03-15 MED ORDER — THROMBIN 5000 UNITS EX SOLR
OROMUCOSAL | Status: DC | PRN
  Administered 2018-03-15: 5 mL via TOPICAL

## 2018-03-15 MED ORDER — MENTHOL 3 MG MT LOZG: 1.0000 | LOZENGE | OROMUCOSAL | Status: DC | PRN

## 2018-03-15 MED ORDER — FENTANYL CITRATE (PF) 250 MCG/5ML IJ SOLN
INTRAMUSCULAR | Status: AC
  Filled 2018-03-15: qty 5

## 2018-03-15 MED ORDER — BACITRACIN-NEOMYCIN-POLYMYXIN 400-5-5000 EX OINT
TOPICAL_OINTMENT | CUTANEOUS | Status: AC
  Filled 2018-03-15: qty 1

## 2018-03-15 MED ORDER — PROPOFOL 10 MG/ML IV BOLUS
INTRAVENOUS | Status: DC | PRN
  Administered 2018-03-15: 100 mg via INTRAVENOUS

## 2018-03-15 MED ORDER — LIDOCAINE 2% (20 MG/ML) 5 ML SYRINGE
INTRAMUSCULAR | Status: AC
  Filled 2018-03-15: qty 5

## 2018-03-15 MED ORDER — LIDOCAINE 2% (20 MG/ML) 5 ML SYRINGE
INTRAMUSCULAR | Status: DC | PRN
  Administered 2018-03-15: 60 mg via INTRAVENOUS

## 2018-03-15 MED ORDER — BUPIVACAINE-EPINEPHRINE (PF) 0.5% -1:200000 IJ SOLN
INTRAMUSCULAR | Status: DC | PRN
  Administered 2018-03-15: 20 mL

## 2018-03-15 MED ORDER — BUPIVACAINE-EPINEPHRINE (PF) 0.5% -1:200000 IJ SOLN
INTRAMUSCULAR | Status: AC
  Filled 2018-03-15: qty 30

## 2018-03-15 MED ORDER — ACETAMINOPHEN 325 MG PO TABS
650.0000 mg | ORAL_TABLET | ORAL | Status: DC | PRN
  Administered 2018-03-15 – 2018-03-16 (×3): 650 mg via ORAL
  Filled 2018-03-15 (×3): qty 2

## 2018-03-15 MED ORDER — NITROGLYCERIN 0.4 MG SL SUBL: 0.4000 mg | SUBLINGUAL_TABLET | SUBLINGUAL | Status: DC | PRN

## 2018-03-15 MED ORDER — CEFAZOLIN SODIUM-DEXTROSE 2-4 GM/100ML-% IV SOLN
2.0000 g | INTRAVENOUS | Status: AC
  Administered 2018-03-15: 2 g via INTRAVENOUS
  Filled 2018-03-15: qty 100

## 2018-03-15 MED ORDER — THROMBIN (RECOMBINANT) 5000 UNITS EX SOLR
CUTANEOUS | Status: AC
  Filled 2018-03-15: qty 10000

## 2018-03-15 MED ORDER — ALBUTEROL SULFATE (2.5 MG/3ML) 0.083% IN NEBU: 3.0000 mL | INHALATION_SOLUTION | Freq: Four times a day (QID) | RESPIRATORY_TRACT | Status: DC | PRN

## 2018-03-15 MED ORDER — METHOCARBAMOL 500 MG PO TABS: 500.0000 mg | ORAL_TABLET | Freq: Four times a day (QID) | ORAL | Status: DC | PRN

## 2018-03-15 MED ORDER — ROCURONIUM BROMIDE 10 MG/ML (PF) SYRINGE
PREFILLED_SYRINGE | INTRAVENOUS | Status: AC
  Filled 2018-03-15: qty 10

## 2018-03-15 MED ORDER — PHENYLEPHRINE 40 MCG/ML (10ML) SYRINGE FOR IV PUSH (FOR BLOOD PRESSURE SUPPORT)
PREFILLED_SYRINGE | INTRAVENOUS | Status: AC
  Filled 2018-03-15: qty 10

## 2018-03-15 MED ORDER — BACITRACIN-NEOMYCIN-POLYMYXIN 400-5-5000 EX OINT
TOPICAL_OINTMENT | CUTANEOUS | Status: DC | PRN
  Administered 2018-03-15: 1 via TOPICAL

## 2018-03-15 MED ORDER — POLYETHYLENE GLYCOL 3350 17 G PO PACK: 17.0000 g | PACK | Freq: Every day | ORAL | Status: DC | PRN

## 2018-03-15 MED ORDER — LACTATED RINGERS IV SOLN
INTRAVENOUS | Status: DC
  Administered 2018-03-15 – 2018-03-16 (×2): via INTRAVENOUS

## 2018-03-15 MED ORDER — ACETAMINOPHEN 650 MG RE SUPP: 650.0000 mg | RECTAL | Status: DC | PRN

## 2018-03-15 MED ORDER — ONDANSETRON HCL 4 MG/2ML IJ SOLN
INTRAMUSCULAR | Status: DC | PRN
  Administered 2018-03-15: 4 mg via INTRAVENOUS

## 2018-03-15 MED ORDER — ONDANSETRON HCL 4 MG PO TABS: 4.0000 mg | ORAL_TABLET | Freq: Four times a day (QID) | ORAL | Status: DC | PRN

## 2018-03-15 MED ORDER — METHOCARBAMOL 500 MG IVPB - SIMPLE MED
INTRAVENOUS | Status: AC
  Administered 2018-03-15: 500 mg via INTRAVENOUS
  Filled 2018-03-15: qty 50

## 2018-03-15 MED ORDER — SODIUM CHLORIDE 0.9 % IV SOLN
INTRAVENOUS | Status: DC | PRN
  Administered 2018-03-15: 500 mL

## 2018-03-15 MED ORDER — PROPOFOL 10 MG/ML IV BOLUS
INTRAVENOUS | Status: AC
  Filled 2018-03-15: qty 20

## 2018-03-15 MED ORDER — PHENYLEPHRINE 40 MCG/ML (10ML) SYRINGE FOR IV PUSH (FOR BLOOD PRESSURE SUPPORT)
PREFILLED_SYRINGE | INTRAVENOUS | Status: DC | PRN
  Administered 2018-03-15 (×3): 120 ug via INTRAVENOUS

## 2018-03-15 MED ORDER — HYDROMORPHONE HCL 1 MG/ML IJ SOLN: 0.2500 mg | INTRAMUSCULAR | Status: DC | PRN

## 2018-03-15 MED ORDER — BUPIVACAINE LIPOSOME 1.3 % IJ SUSP
INTRAMUSCULAR | Status: DC | PRN
  Administered 2018-03-15: 20 mL

## 2018-03-15 MED ORDER — LOSARTAN POTASSIUM 50 MG PO TABS
100.0000 mg | ORAL_TABLET | Freq: Every evening | ORAL | Status: DC
  Filled 2018-03-15 (×2): qty 2

## 2018-03-15 MED ORDER — CEFAZOLIN SODIUM-DEXTROSE 1-4 GM/50ML-% IV SOLN
1.0000 g | Freq: Three times a day (TID) | INTRAVENOUS | Status: AC
  Administered 2018-03-15 – 2018-03-16 (×3): 1 g via INTRAVENOUS
  Filled 2018-03-15 (×3): qty 50

## 2018-03-15 MED ORDER — POTASSIUM CHLORIDE CRYS ER 10 MEQ PO TBCR
10.0000 meq | EXTENDED_RELEASE_TABLET | Freq: Every day | ORAL | Status: DC
  Administered 2018-03-15 – 2018-03-16 (×2): 10 meq via ORAL
  Filled 2018-03-15 (×2): qty 1

## 2018-03-15 MED ORDER — METOPROLOL SUCCINATE ER 50 MG PO TB24
100.0000 mg | ORAL_TABLET | Freq: Every day | ORAL | Status: DC
  Administered 2018-03-16: 100 mg via ORAL
  Filled 2018-03-15: qty 2

## 2018-03-15 MED ORDER — FUROSEMIDE 40 MG PO TABS
40.0000 mg | ORAL_TABLET | Freq: Every day | ORAL | Status: DC
  Administered 2018-03-15 – 2018-03-16 (×2): 40 mg via ORAL
  Filled 2018-03-15 (×2): qty 1

## 2018-03-15 MED ORDER — PHENOL 1.4 % MT LIQD: 1.0000 | OROMUCOSAL | Status: DC | PRN

## 2018-03-15 MED ORDER — ONDANSETRON HCL 4 MG/2ML IJ SOLN
4.0000 mg | Freq: Four times a day (QID) | INTRAMUSCULAR | Status: DC | PRN
  Administered 2018-03-16: 4 mg via INTRAVENOUS
  Filled 2018-03-15: qty 2

## 2018-03-15 MED ORDER — PROMETHAZINE HCL 25 MG/ML IJ SOLN: 6.2500 mg | INTRAMUSCULAR | Status: DC | PRN

## 2018-03-15 MED ORDER — SODIUM CHLORIDE 0.9 % IV SOLN
INTRAVENOUS | Status: DC | PRN
  Administered 2018-03-15: 50 ug/min via INTRAVENOUS

## 2018-03-15 MED ORDER — SUGAMMADEX SODIUM 200 MG/2ML IV SOLN
INTRAVENOUS | Status: DC | PRN
  Administered 2018-03-15: 200 mg via INTRAVENOUS

## 2018-03-15 MED ORDER — HYDROMORPHONE HCL 1 MG/ML IJ SOLN
0.5000 mg | INTRAMUSCULAR | Status: DC | PRN
  Administered 2018-03-15: 0.5 mg via INTRAVENOUS
  Filled 2018-03-15 (×2): qty 0.5

## 2018-03-15 MED ORDER — FENTANYL CITRATE (PF) 250 MCG/5ML IJ SOLN
INTRAMUSCULAR | Status: DC | PRN
  Administered 2018-03-15: 100 ug via INTRAVENOUS
  Administered 2018-03-15: 25 ug via INTRAVENOUS
  Administered 2018-03-15: 50 ug via INTRAVENOUS

## 2018-03-15 MED ORDER — CHLORHEXIDINE GLUCONATE 4 % EX LIQD: 60.0000 mL | Freq: Once | CUTANEOUS | Status: DC

## 2018-03-15 SURGICAL SUPPLY — 54 items
AGENT HMST SPONGE THK3/8 (HEMOSTASIS) ×1
APL SKNCLS STERI-STRIP NONHPOA (GAUZE/BANDAGES/DRESSINGS) ×1
BAG SPEC THK2 15X12 ZIP CLS (MISCELLANEOUS) ×1
BAG ZIPLOCK 12X15 (MISCELLANEOUS) ×2 IMPLANT
BENZOIN TINCTURE PRP APPL 2/3 (GAUZE/BANDAGES/DRESSINGS) ×3 IMPLANT
CLEANER TIP ELECTROSURG 2X2 (MISCELLANEOUS) ×3 IMPLANT
COVER SURGICAL LIGHT HANDLE (MISCELLANEOUS) ×3 IMPLANT
DRAPE MICROSCOPE LEICA (MISCELLANEOUS) ×3 IMPLANT
DRAPE SHEET LG 3/4 BI-LAMINATE (DRAPES) ×3 IMPLANT
DRAPE SURG 17X11 SM STRL (DRAPES) ×3 IMPLANT
DRSG ADAPTIC 3X8 NADH LF (GAUZE/BANDAGES/DRESSINGS) ×3 IMPLANT
DRSG PAD ABDOMINAL 8X10 ST (GAUZE/BANDAGES/DRESSINGS) ×12 IMPLANT
DURAPREP 26ML APPLICATOR (WOUND CARE) ×3 IMPLANT
ELECT BLADE TIP CTD 4 INCH (ELECTRODE) ×3 IMPLANT
ELECT REM PT RETURN 15FT ADLT (MISCELLANEOUS) ×3 IMPLANT
GAUZE SPONGE 4X4 12PLY STRL (GAUZE/BANDAGES/DRESSINGS) ×3 IMPLANT
GLOVE BIOGEL PI IND STRL 6.5 (GLOVE) ×1 IMPLANT
GLOVE BIOGEL PI IND STRL 7.5 (GLOVE) IMPLANT
GLOVE BIOGEL PI IND STRL 8.5 (GLOVE) ×1 IMPLANT
GLOVE BIOGEL PI IND STRL 9 (GLOVE) IMPLANT
GLOVE BIOGEL PI INDICATOR 6.5 (GLOVE) ×4
GLOVE BIOGEL PI INDICATOR 7.5 (GLOVE) ×4
GLOVE BIOGEL PI INDICATOR 8.5 (GLOVE) ×2
GLOVE BIOGEL PI INDICATOR 9 (GLOVE) ×2
GLOVE ECLIPSE 8.0 STRL XLNG CF (GLOVE) ×6 IMPLANT
GLOVE SURG ORTHO 8.5 STRL (GLOVE) ×2 IMPLANT
GLOVE SURG SS PI 6.5 STRL IVOR (GLOVE) ×3 IMPLANT
GOWN SPEC L3 XXLG W/TWL (GOWN DISPOSABLE) ×2 IMPLANT
GOWN STRL REUS W/ TWL LRG LVL3 (GOWN DISPOSABLE) ×1 IMPLANT
GOWN STRL REUS W/TWL LRG LVL3 (GOWN DISPOSABLE) ×3
GOWN STRL REUS W/TWL XL LVL3 (GOWN DISPOSABLE) ×3 IMPLANT
HEMOSTAT SPONGE AVITENE ULTRA (HEMOSTASIS) ×3 IMPLANT
KIT BASIN OR (CUSTOM PROCEDURE TRAY) ×3 IMPLANT
MANIFOLD NEPTUNE II (INSTRUMENTS) ×3 IMPLANT
NDL SPNL 18GX3.5 QUINCKE PK (NEEDLE) ×2 IMPLANT
NEEDLE HYPO 22GX1.5 SAFETY (NEEDLE) ×6 IMPLANT
NEEDLE SPNL 18GX3.5 QUINCKE PK (NEEDLE) ×6 IMPLANT
PACK LAMINECTOMY ORTHO (CUSTOM PROCEDURE TRAY) ×3 IMPLANT
PAD ABD 8X10 STRL (GAUZE/BANDAGES/DRESSINGS) ×2 IMPLANT
PATTIES SURGICAL .5 X.5 (GAUZE/BANDAGES/DRESSINGS) IMPLANT
PATTIES SURGICAL .75X.75 (GAUZE/BANDAGES/DRESSINGS) ×3 IMPLANT
PATTIES SURGICAL 1X1 (DISPOSABLE) IMPLANT
SPONGE LAP 4X18 RFD (DISPOSABLE) ×12 IMPLANT
SPONGE SURGIFOAM ABS GEL 100 (HEMOSTASIS) ×2 IMPLANT
STAPLER VISISTAT 35W (STAPLE) ×3 IMPLANT
SUT VIC AB 1 CT1 27 (SUTURE) ×6
SUT VIC AB 1 CT1 27XBRD ANTBC (SUTURE) ×2 IMPLANT
SUT VIC AB 2-0 CT1 27 (SUTURE) ×6
SUT VIC AB 2-0 CT1 TAPERPNT 27 (SUTURE) ×2 IMPLANT
SYR 20CC LL (SYRINGE) ×6 IMPLANT
TAPE CLOTH SURG 6X10 WHT LF (GAUZE/BANDAGES/DRESSINGS) ×2 IMPLANT
TOWEL OR 17X26 10 PK STRL BLUE (TOWEL DISPOSABLE) ×3 IMPLANT
TRAY FOLEY CATH 16FRSI W/METER (SET/KITS/TRAYS/PACK) ×2 IMPLANT
YANKAUER SUCT BULB TIP NO VENT (SUCTIONS) ×2 IMPLANT

## 2018-03-15 NOTE — Plan of Care (Signed)
  Problem: Health Behavior/Discharge Planning: Goal: Ability to manage health-related needs will improve Outcome: Progressing   Problem: Clinical Measurements: Goal: Ability to maintain clinical measurements within normal limits will improve Outcome: Progressing   Problem: Clinical Measurements: Goal: Respiratory complications will improve Outcome: Progressing   

## 2018-03-15 NOTE — Anesthesia Procedure Notes (Signed)
Procedure Name: Intubation Date/Time: 03/15/2018 8:26 AM Performed by: [place] Raeonna Milo C, CRNA Pre-anesthesia Checklist: Patient identified, Emergency Drugs available, Suction available and Patient being monitored Patient Re-evaluated:Patient Re-evaluated prior to induction Oxygen Delivery Method: Circle system utilized Preoxygenation: Pre-oxygenation with 100% oxygen Induction Type: IV induction Ventilation: Mask ventilation without difficulty Laryngoscope Size: Mac and 4 Grade View: Grade I Tube type: Oral Tube size: 7.5 mm Number of attempts: 1 Airway Equipment and Method: Stylet and Oral airway Placement Confirmation: ETT inserted through vocal cords under direct vision,  positive ETCO2 and breath sounds checked- equal and bilateral Secured at: 22 cm Tube secured with: Tape Dental Injury: Teeth and Oropharynx as per pre-operative assessment

## 2018-03-15 NOTE — Brief Op Note (Signed)
03/15/2018  11:07 AM  PATIENT:  [name]  72 y.o. male  PRE-OPERATIVE DIAGNOSIS:  spinal stenosis L4-L5 with herniated nucleus pulposus on right,and Foraminal Stenosis involving the L-4 and the L-5 nerve roots on the Right. Right Foot Drop.  POST-OPERATIVE DIAGNOSIS:  spinal stenosis L4-L5  And Foraminal Stenosis involving the L-4 and L-5 nerve roots on the right.  PROCEDURE:  Procedure(s): Complete decompressive lumbar laminectomy for spinal stenosis of L4-L5 and foraminotomy for L4-L5 root on right (N/A),both Nerve roots.Exploration of the L-4-L-5 Disc Space on the Right.  SURGEON:  Surgeon(s) and Role:    * [name], MD - Primary  PHYSICIAN ASSISTANT:Amber Janes[place] PA   ASSISTANTS:Amber Kimberly PA   ANESTHESIA:   general  EBL:  550 mL   BLOOD ADMINISTERED:none  DRAINS: none   LOCAL MEDICATIONS USED:  MARCAINE20cc of 0.50% with Epinephrine at the start of the case and 20cc of Exparel at the end of the case.     SPECIMEN:  No Specimen  DISPOSITION OF SPECIMEN:  N/A  COUNTS:  YES  TOURNIQUET:  * No tourniquets in log *  DICTATION: .Other Dictation: Dictation Number [id number]  PLAN OF CARE: Admit for overnight observation  PATIENT DISPOSITION:  PACU - hemodynamically stable.   Delay start of Pharmacological VTE agent (>24hrs) due to surgical blood loss or risk of bleeding: yes

## 2018-03-15 NOTE — Anesthesia Preprocedure Evaluation (Signed)
Anesthesia Evaluation  Patient identified by MRN, date of birth, ID band Patient awake    Reviewed: Allergy & Precautions, NPO status , Patient's Chart, lab work & pertinent test results  Airway Mallampati: II  TM Distance: >3 FB Neck ROM: Full    Dental no notable dental hx.    Pulmonary neg pulmonary ROS, former smoker,    Pulmonary exam normal breath sounds clear to auscultation       Cardiovascular hypertension, + CAD  + pacemaker  Rhythm:Regular Rate:Normal + Systolic murmurs Aortic stenosis 08/25/2010 a. Bentall aortic root replacement with a St. Jude mechanical valve and Hemashield conduit 02/2004. AV BLOCK, COMPLETE  a. s/p St Jude dual chamber pacemaker 02/2004. CAD (coronary artery disease)  a. s/p CABGx2 (LIMA-dLAD, SVG-Cx). b. Low risk nuc 12/2012 without ischemia, EF 46% mild apical hypokinesia (EF 55% inf HK by echo).        Neuro/Psych negative neurological ROS  negative psych ROS   GI/Hepatic negative GI ROS, Neg liver ROS,   Endo/Other  diabetes  Renal/GU negative Renal ROS  negative genitourinary   Musculoskeletal negative musculoskeletal ROS (+)   Abdominal   Peds negative pediatric ROS (+)  Hematology negative hematology ROS (+)   Anesthesia Other Findings   Reproductive/Obstetrics negative OB ROS                             Anesthesia Physical Anesthesia Plan  ASA: IV  Anesthesia Plan: General   Post-op Pain Management:    Induction: Intravenous  PONV Risk Score and Plan: 2 and Ondansetron, Dexamethasone and Treatment may vary due to age or medical condition  Airway Management Planned: Oral ETT  Additional Equipment:   Intra-op Plan:   Post-operative Plan: Extubation in OR  Informed Consent: I have reviewed the patients History and Physical, chart, labs and discussed the procedure including the risks, benefits and alternatives for the proposed anesthesia  with the patient or authorized representative who has indicated his/her understanding and acceptance.   Dental advisory given  Plan Discussed with: CRNA and Surgeon  Anesthesia Plan Comments:         Anesthesia Quick Evaluation

## 2018-03-15 NOTE — Anesthesia Postprocedure Evaluation (Signed)
Anesthesia Post Note  Patient: [name]  Procedure(s) Performed: Complete decompressive lumbar laminectomy for spinal stenosis of L4-L5 and foraminotomy for L4-L5 root on right (N/A Back)     Patient location during evaluation: PACU Anesthesia Type: General Level of consciousness: awake and alert Pain management: pain level controlled Vital Signs Assessment: post-procedure vital signs reviewed and stable Respiratory status: spontaneous breathing, nonlabored ventilation, respiratory function stable and patient connected to nasal cannula oxygen Cardiovascular status: blood pressure returned to baseline and stable Postop Assessment: no apparent nausea or vomiting Anesthetic complications: no    Last Vitals:  Vitals:   03/15/18 1355 03/15/18 1504  BP: 128/67 137/83  Pulse: 63 63  Resp: 15   Temp: 36.5 C 36.9 C  SpO2: 100% 100%    Last Pain:  Vitals:   03/15/18 1504  TempSrc: Oral  PainSc:                  Adream Parzych S

## 2018-03-15 NOTE — Discharge Instructions (Addendum)
For the first three days, remove your dressing, and tape a piece of saran wrap over your incision Take your shower, then remove the saran wrap and put a clean dressing on After three days you can shower without the saran wrap.  Call Dr. Gladstone Lighter if any wound complications or temperature of 101 degrees F or over.  No lifting or excessive bending No driving while taking pain medications.  Call the office for an appointment to see Dr. Gladstone Lighter in two weeks: [phone] and ask for Dr. Charlestine Night nurse, Brunilda Payor.  Make appointment early next week for PT/INR check for coumadin level correction

## 2018-03-15 NOTE — Evaluation (Signed)
Physical Therapy Evaluation Patient Details Name: [name] MRN: [id number] DOB: 1946-03-10 Today's Date: 03/15/2018   History of Present Illness  s/p lumbar decompression for stenosis  Clinical Impression  The patient is mobilizing slowly. Pt admitted ith above diagnosis. Pt currently with functional limitations due to the deficits listed below (see PT Problem List).  Pt will benefit from skilled PT to increase their independence and safety with mobility to allow discharge to the venue listed below.       Follow Up Recommendations Follow surgeon's recommendation for DC plan and follow-up therapies    Equipment Recommendations  None recommended by PT    Recommendations for Other Services       Precautions / Restrictions Precautions Precautions: Back Precaution Comments: reviewed back precautions Restrictions Weight Bearing Restrictions: No      Mobility  Bed Mobility Overal bed mobility: Needs Assistance Bed Mobility: Rolling;Sidelying to Sit Rolling: Min assist Sidelying to sit: Min assist       General bed mobility comments: cues for precautions  Transfers Overall transfer level: Needs assistance Equipment used: Rolling walker (2 wheeled) Transfers: Sit to/from Stand Sit to Stand: Min assist;From elevated surface         General transfer comment: steady assist to rise, cues for ahnd position  Ambulation/Gait Ambulation/Gait assistance: Min assist Gait Distance (Feet): 15 Feet Assistive device: Rolling walker (2 wheeled) Gait Pattern/deviations: Step-to pattern;Shuffle;Narrow base of support;Decreased stride length     General Gait Details: shuffling gait  Stairs            Wheelchair Mobility    Modified Rankin (Stroke Patients Only)       Balance                                             Pertinent Vitals/Pain Pain Assessment: 0-10 Pain Score: 7  Pain Descriptors / Indicators: Aching Pain Intervention(s):  Limited activity within patient's tolerance;Monitored during session    Home Living Family/patient expects to be discharged to:: Private residence Living Arrangements: Spouse/significant other   Type of Home: Mobile home Home Access: Ramped entrance       Home Equipment: Environmental consultant - 2 wheels      Prior Function Level of Independence: Independent               Hand Dominance        Extremity/Trunk Assessment   Upper Extremity Assessment Upper Extremity Assessment: Defer to OT evaluation    Lower Extremity Assessment Lower Extremity Assessment: Generalized weakness    Cervical / Trunk Assessment Cervical / Trunk Assessment: Other exceptions Cervical / Trunk Exceptions: trunk forward flexed  Communication   Communication: No difficulties  Cognition Arousal/Alertness: Awake/alert Behavior During Therapy: WFL for tasks assessed/performed;Flat affect Overall Cognitive Status: Within Functional Limits for tasks assessed                                        General Comments      Exercises     Assessment/Plan    PT Assessment Patient needs continued PT services  PT Problem List Decreased strength;Decreased activity tolerance;Decreased balance;Decreased mobility;Decreased knowledge of precautions;Decreased safety awareness;Decreased knowledge of use of DME;Pain       PT Treatment Interventions DME instruction;Gait training;Functional mobility training;Therapeutic activities;Patient/family education  PT Goals (Current goals can be found in the Care Plan section)  Acute Rehab PT Goals Patient Stated Goal: to go home PT Goal Formulation: With patient Time For Goal Achievement: 03/18/18 Potential to Achieve Goals: Good    Frequency Min 5X/week   Barriers to discharge        Co-evaluation               AM-PAC PT "6 Clicks" Daily Activity  Outcome Measure Difficulty turning over in bed (including adjusting bedclothes, sheets and  blankets)?: Unable Difficulty moving from lying on back to sitting on the side of the bed? : Unable Difficulty sitting down on and standing up from a chair with arms (e.g., wheelchair, bedside commode, etc,.)?: Unable Help needed moving to and from a bed to chair (including a wheelchair)?: Total Help needed walking in hospital room?: Total Help needed climbing 3-5 steps with a railing? : Total 6 Click Score: 6    End of Session   Activity Tolerance: Patient tolerated treatment well Patient left: in chair;with call bell/phone within reach Nurse Communication: Mobility status PT Visit Diagnosis: Unsteadiness on feet (R26.81)    Time: 6151-8343 PT Time Calculation (min) (ACUTE ONLY): 36 min   Charges:   PT Evaluation $PT Eval Low Complexity: 1 Low PT Treatments $Gait Training: 8-22 mins           Claretha Cooper 03/15/2018, 5:25 PM

## 2018-03-15 NOTE — Transfer of Care (Signed)
Immediate Anesthesia Transfer of Care Note  Patient: [name]  Procedure(s) Performed: Complete decompressive lumbar laminectomy for spinal stenosis of L4-L5 and foraminotomy for L4-L5 root on right (N/A Back)  Patient Location: PACU  Anesthesia Type:General  Level of Consciousness: awake, alert  and oriented  Airway & Oxygen Therapy: Patient Spontanous Breathing and Patient connected to face mask oxygen  Post-op Assessment: Report given to RN and Post -op Vital signs reviewed and stable  Post vital signs: Reviewed and stable  Last Vitals:  Vitals Value Taken Time  BP    Temp    Pulse    Resp    SpO2      Last Pain:  Vitals:   03/15/18 0631  TempSrc: Oral      Patients Stated Pain Goal: 4 (53/91/22 5834)  Complications: No apparent anesthesia complications

## 2018-03-15 NOTE — Op Note (Signed)
NAME: [name], [name] MEDICAL RECORD [id number] ACCOUNT [id number] DATE OF BIRTH:[birth year] FACILITY: [facility] LOCATION: [facility] PHYSICIAN:Truman Aceituno Fransico Setters, MD  OPERATIVE REPORT  DATE OF PROCEDURE:  03/15/2018  SURGEON:  Kipp Brood. Brenlee Koskela, MD  ASSISTANT:  Ardeen Jourdain, PA-C  PREOPERATIVE DIAGNOSES: 1.  Spinal stenosis at L4-L5. 2.  Partial foot drop on the right. 3.  Questionable disk herniation at 4-5 on the right. 4.  Foraminal stenosis involving the L4 root on the right. 5.  Foraminal stenosis involving the L5 root on the right.  POSTOPERATIVE DIAGNOSES:   1.  Spinal stenosis at L4-L5. 2.  Partial foot drop on the right. 3.  Questionable disk herniation at 4-5 on the right. 4.  Foraminal stenosis involving the L4 root on the right. 5.  Foraminal stenosis involving the L5 root on the right.  OPERATION: 1.  complete decompressive lumbar laminectomy at L4-L5 for spinal stenosis. 2.  Exploration of the L4-L5 disk space on the right. 3.  Foraminotomy for the L4 root on the right. 4.  Foraminotomy for the L5 nerve root on the right.  DESCRIPTION OF PROCEDURE:  Under general anesthesia, routine orthopedic prep and draping of the lower back was carried out.  The patient had 2 grams of IV Ancef.  Appropriate timeout was carried out also marking the appropriate right side of the back in  the holding area.  At this time, after sterile prep and draping and after the timeout with the patient on the Wilson frame, 2 needles were placed in the back for localization purposes.  An x-ray was taken.  An incision then was made over the L4-5 space.   This was extended proximally and distally.  At this time, the self-retaining retractors were inserted.  I then dissected the muscle off of the lamina and spinous processes bilaterally at L4-L5.  We extended the dissection proximally and distally.  A  Kocher clamp was placed on the spinous processes.  Another x-ray was taken.  We then went down  and did a central decompressive lumbar laminectomy at L4-L5 in the usual fashion.  The microscope was then brought in.  Note, we took a great deal of time  because of the continuous bleeding which was a very low grade type of bleeding from perhaps the previous blood thinners the patient was on, even though we had him off of that for at least five to seven days.  Once we were down to the lamina with our  dissection, I mentioned, we brought the microscope in and did a complete central decompressive lumbar laminectomy.  I then went out and dissected the ligamentum flavum.  Once this was dissected off the dura, we then protected the dura with cottonoids.  I  then extended the dissection distally.  Another marker was placed and an x-ray was taken.  I went distal and proximal to the L4-L5 disk space until we had good freedom of the dura.  We went out and thoroughly decompressed the lateral recess as well.  We  did a nice foraminotomy as well to free up the roots.  Once this was done, we were able to easily move the dura.  There were no leaks noted.  We thoroughly irrigated out the area, loosely applied some thrombin-soaked Gelfoam and closed the wound in  layers in the usual fashion.  I left a small deep distal and proximal part of the wound open for drainage purposes in order to avoid any compression on the sac.  At the beginning  of the procedure, I injected 20 mL of 0.5% Marcaine and epinephrine into  the soft tissue.  That was to control bleeding.  At the end of the procedure, we injected 20 mL of Exparel for pain relief.  The sterile dressings were applied after the wound was closed.  As I mentioned, the patient had 2 grams of IV Ancef.    He was returned to the recovery room.  AN/NUANCE  D:03/15/2018 T:03/15/2018 JOB:[id number]

## 2018-03-15 NOTE — Interval H&P Note (Signed)
History and Physical Interval Note:  03/15/2018 7:58 AM  [name]  has presented today for surgery, with the diagnosis of spinal stenosis L4-L5 with herniated nucleus pulposus on right  The various methods of treatment have been discussed with the patient and family. After consideration of risks, benefits and other options for treatment, the patient has consented to  Procedure(s) with comments: Decompressive lumbar laminectomy and microdiscectomy L4-L5 right (N/A) - 150min as a surgical intervention .  The patient's history has been reviewed, patient examined, no change in status, stable for surgery.  I have reviewed the patient's chart and labs.  Questions were answered to the patient's satisfaction.     [name]

## 2018-03-16 ENCOUNTER — Encounter (HOSPITAL_COMMUNITY): Payer: Self-pay | Admitting: Orthopedic Surgery

## 2018-03-16 DIAGNOSIS — M5126 Other intervertebral disc displacement, lumbar region: Secondary | ICD-10-CM | POA: Diagnosis not present

## 2018-03-16 DIAGNOSIS — I442 Atrioventricular block, complete: Secondary | ICD-10-CM | POA: Diagnosis not present

## 2018-03-16 DIAGNOSIS — M48062 Spinal stenosis, lumbar region with neurogenic claudication: Secondary | ICD-10-CM | POA: Diagnosis not present

## 2018-03-16 DIAGNOSIS — M21371 Foot drop, right foot: Secondary | ICD-10-CM | POA: Diagnosis not present

## 2018-03-16 DIAGNOSIS — M48061 Spinal stenosis, lumbar region without neurogenic claudication: Secondary | ICD-10-CM | POA: Diagnosis not present

## 2018-03-16 DIAGNOSIS — I10 Essential (primary) hypertension: Secondary | ICD-10-CM | POA: Diagnosis not present

## 2018-03-16 DIAGNOSIS — J45909 Unspecified asthma, uncomplicated: Secondary | ICD-10-CM | POA: Diagnosis not present

## 2018-03-16 DIAGNOSIS — I350 Nonrheumatic aortic (valve) stenosis: Secondary | ICD-10-CM | POA: Diagnosis not present

## 2018-03-16 DIAGNOSIS — E119 Type 2 diabetes mellitus without complications: Secondary | ICD-10-CM | POA: Diagnosis not present

## 2018-03-16 DIAGNOSIS — I2510 Atherosclerotic heart disease of native coronary artery without angina pectoris: Secondary | ICD-10-CM | POA: Diagnosis not present

## 2018-03-16 LAB — GLUCOSE, CAPILLARY
Glucose-Capillary: 149 mg/dL — ABNORMAL HIGH (ref 70–99)
Glucose-Capillary: 165 mg/dL — ABNORMAL HIGH (ref 70–99)

## 2018-03-16 MED ORDER — METHOCARBAMOL 500 MG PO TABS: 500.0000 mg | ORAL_TABLET | Freq: Four times a day (QID) | ORAL | 0 refills | Status: DC | PRN

## 2018-03-16 MED ORDER — TRAMADOL HCL 50 MG PO TABS: 50.0000 mg | ORAL_TABLET | Freq: Four times a day (QID) | ORAL | 0 refills | Status: DC | PRN

## 2018-03-16 NOTE — Progress Notes (Signed)
   03/16/18 1244 PT note  PT Visit Information  Last PT Received On 03/16/18  Assistance Needed +1  History of Present Illness s/p lumbar decompression for stenosis, H/O PPM  Precautions  Precautions Back  Precaution Comments reviewed back precautions  Pain Assessment  Pain Location back  Pain Descriptors / Indicators Aching  Cognition  Arousal/Alertness Awake/alert  Behavior During Therapy Flat affect  General Comments slow processing at times  Bed Mobility  General bed mobility comments in chair  Transfers  General transfer comment in chair  Ambulation/Gait  Ambulation/Gait assistance Min assist  Gait Distance (Feet) 100 Feet  Assistive device Rolling walker (2 wheeled)  Gait Pattern/deviations Step-to pattern;Shuffle;Narrow base of support;Decreased stride length;Festinating  General Gait Details shuffling gait, mildly festinating, balance loss x 2 with turns  Balance  Overall balance assessment Needs assistance  Sitting-balance support Feet supported;Bilateral upper extremity supported  Sitting balance-Leahy Scale Good  Standing balance support During functional activity;Bilateral upper extremity supported  Standing balance-Leahy Scale Poor  PT - End of Session  Equipment Utilized During Treatment Gait belt  Activity Tolerance Patient tolerated treatment well  Patient left in chair;with call bell/phone within reach;with chair alarm set  Nurse Communication Mobility status   PT - Assessment/Plan  PT Plan Current plan remains appropriate  PT Visit Diagnosis Unsteadiness on feet (R26.81)  PT Frequency (ACUTE ONLY) Min 5X/week  Follow Up Recommendations Home health PT  PT equipment None recommended by PT  AM-PAC PT "6 Clicks" Daily Activity Outcome Measure  Difficulty turning over in bed (including adjusting bedclothes, sheets and blankets)? 1  Difficulty moving from lying on back to sitting on the side of the bed?  1  Difficulty sitting down on and standing up from a  chair with arms (e.g., wheelchair, bedside commode, etc,.)? 2  Help needed moving to and from a bed to chair (including a wheelchair)? 2  Help needed walking in hospital room? 2  Help needed climbing 3-5 steps with a railing?  1  6 Click Score 9  Mobility G Code  CL  PT Goal Progression  Progress towards PT goals Progressing toward goals  PT Time Calculation  PT Start Time (ACUTE ONLY) 1136  PT Stop Time (ACUTE ONLY) 1148  PT Time Calculation (min) (ACUTE ONLY) 12 min  PT General Charges  $$ ACUTE PT VISIT 1 Visit  PT Treatments  $Gait Training 8-22 mins  [place] PT [phone]  1 gait

## 2018-03-16 NOTE — Progress Notes (Signed)
Pt and pt's Wife was provided with the prescriptions and discharge instructions. After discussing the prescriptions and discharge instructions, the pt and pt's Wife reported no further questions or concerns.

## 2018-03-16 NOTE — Evaluation (Signed)
Occupational Therapy Evaluation Patient Details Name: [name] MRN: [id number] DOB: [birth year] Today's Date: 03/16/2018    History of Present Illness s/p lumbar decompression for stenosis   Clinical Impression   This 72 year old man was admitted for the above sx. At baseline, he is independent with adls. He needs mostly min A for transfers and mod A for LB adls. Needs reinforcement with back precautions. His wife also has back issues. Will follow in acute setting and recommend HHOT to continue to increase independence    Follow Up Recommendations  Home health OT;Supervision/Assistance - 24 hour    Equipment Recommendations  3 in 1 bedside commode    Recommendations for Other Services       Precautions / Restrictions Precautions Precautions: Back Precaution Booklet Issued: Yes (comment) Precaution Comments: reviewed back precautions Restrictions Weight Bearing Restrictions: No      Mobility Bed Mobility     Rolling: Mod assist Sidelying to sit: Mod assist       General bed mobility comments: extra time and assistance  Transfers   Equipment used: Rolling walker (2 wheeled)   Sit to Stand: Min assist         General transfer comment: from bed; mod from comfort height commode with grab bar.  cues for back precautions    Balance                                           ADL either performed or assessed with clinical judgement   ADL Overall ADL's : Needs assistance/impaired             Lower Body Bathing: Minimal assistance;Sit to/from stand;With adaptive equipment       Lower Body Dressing: Moderate assistance;Sit to/from stand;With adaptive equipment;Maximal assistance   Toilet Transfer: Moderate assistance;Minimal assistance;Ambulation;Comfort height toilet;Grab bars             General ADL Comments: reviewed protocol.  Pt needs supervison for UB and mod/maxA for LB with AE.  Needs practice with reacher; needs help  with compression socks.  Reviewed toilet aide options.  Demonstrated shower transfer but did not perform     Vision         Perception     Praxis      Pertinent Vitals/Pain Pain Score: 4 (neck/back easing off) Pain Descriptors / Indicators: Aching Pain Intervention(s): Limited activity within patient's tolerance;Monitored during session;Premedicated before session;Repositioned     Hand Dominance     Extremity/Trunk Assessment Upper Extremity Assessment Upper Extremity Assessment: Generalized weakness           Communication Communication Communication: No difficulties   Cognition Arousal/Alertness: Awake/alert Behavior During Therapy: WFL for tasks assessed/performed;Flat affect                                   General Comments: slow processing at times   General Comments       Exercises     Shoulder Instructions      Home Living Family/patient expects to be discharged to:: Private residence Living Arrangements: Spouse/significant other                 Bathroom Shower/Tub: Occupational psychologist: Standard     Home Equipment: Shower seat - built in;Grab bars - tub/shower  Prior Functioning/Environment Level of Independence: Independent        Comments: wife also has back problems and 71 y o mother in law lives with them--currently in hospital        OT Problem List: Decreased strength;Decreased activity tolerance;Pain;Decreased knowledge of use of DME or AE;Decreased knowledge of precautions      OT Treatment/Interventions: Self-care/ADL training;DME and/or AE instruction;Patient/family education;Balance training;Therapeutic activities    OT Goals(Current goals can be found in the care plan section) Acute Rehab OT Goals Patient Stated Goal: to go home OT Goal Formulation: With patient Time For Goal Achievement: 03/30/18 Potential to Achieve Goals: Good ADL Goals Pt Will Transfer to Toilet: with min  guard assist;ambulating;bedside commode Pt Will Perform Tub/Shower Transfer: Shower transfer;with min guard assist;3 in 1;ambulating Additional ADL Goal #1: pt will follow back precautions with one cue per task during adls  OT Frequency: Min 2X/week   Barriers to D/C:            Co-evaluation              AM-PAC PT "6 Clicks" Daily Activity     Outcome Measure Help from another person eating meals?: None Help from another person taking care of personal grooming?: A Little Help from another person toileting, which includes using toliet, bedpan, or urinal?: A Lot Help from another person bathing (including washing, rinsing, drying)?: A Little Help from another person to put on and taking off regular upper body clothing?: A Little Help from another person to put on and taking off regular lower body clothing?: A Lot 6 Click Score: 17   End of Session    Activity Tolerance: Patient limited by fatigue Patient left: in chair;with call bell/phone within reach;with chair alarm set  OT Visit Diagnosis: Muscle weakness (generalized) (M62.81)                Time: 0511-0211 OT Time Calculation (min): 36 min Charges:  OT General Charges $OT Visit: 1 Visit OT Evaluation $OT Eval Low Complexity: 1 Low OT Treatments $Self Care/Home Management : 8-22 mins  [name], OTR/L [phone] 03/16/2018  [name] 03/16/2018, 9:04 AM

## 2018-03-16 NOTE — Care Management (Signed)
Pt with HHPT orders. Choice offered and [facility] chosen. [facility] rep alerted of referral. Marney Doctor RN,BSN [phone]

## 2018-03-16 NOTE — Discharge Summary (Signed)
Physician Discharge Summary   Patient ID: [name] MRN: [id number] DOB/AGE: 74/29/1947 74 y.o.  Admit date: 03/15/2018 Discharge date: 03/16/2018  Primary Diagnosis:   spinal stenosis L4-L5 with herniated nucleus pulposus on right  Admission Diagnoses:  Past Medical History:  Diagnosis Date  . Acute renal failure ([place]) 02/09/2013  . Aortic stenosis 08/25/2010   a. Bentall aortic root replacement with a St. Jude mechanical valve and Hemashield conduit 02/2004.  Marland Kitchen Arthritis   . Asthma   . AV BLOCK, COMPLETE    a. s/p St Jude dual chamber pacemaker 02/2004.  Marland Kitchen CAD (coronary artery disease)    a. s/p CABGx2 (LIMA-dLAD, SVG-Cx). b. Low risk nuc 12/2012 without ischemia, EF 46% mild apical hypokinesia (EF 55% inf HK by echo).  . Carotid artery disease ([place])    a. 0-39% bilateral ICA stenosis, stable mild hard plaque in carotid bulbs. F/u 03/2014 recommended.  . Diabetes mellitus without complication ([place])    type 2   . DIVERTICULOSIS, COLON 10/17/2007  . Ejection fraction    a. EF 55% with inf HK, mild MR by echo 12/2012.  Marland Kitchen GERD (gastroesophageal reflux disease)    hxof   . GOUT 01/16/2007  . Headache    hx of migraines   . HEMORRHOIDS, INTERNAL 11/01/2008  . HYPERLIPIDEMIA 01/16/2007  . HYPERTENSION 01/16/2007  . Impaired glucose tolerance   . LEG EDEMA 11/27/2008  . Special screening for malignant neoplasm of prostate   . Warfarin anticoagulation    AVR   Discharge Diagnoses:   Active Problems:   Spinal stenosis, lumbar region with neurogenic claudication  Procedure:  Procedure(s) (LRB): Complete decompressive lumbar laminectomy for spinal stenosis of L4-L5 and foraminotomy for L4-L5 root on right (N/A)   Consults: None  HPI: Mr. Simonis presented with the chief complaint of right hip pain. He says his hip has been giving him trouble for about 2 months. No known injury. Pain is 6/10. He is taking tramadol for pain which he says helps along with tylenol. Aching pain in the hip.  Certain positions will sometimes help with the pain but not for long. Hard to bend over and do any work due to the pain. He says he is interested in getting a shot in his hip because they help with his knee pain. He denies groin pain. He reports long history of low back pain. He is having pain in the right lower lumbar area that radiates into the buttocks and down to the calf. He has some numbness in the right foot. He noticed the pain about 2 months ago after bending over struggling to put on a compression stocking. No change in bladder or bowel function. CT myelogram showed severe spinal stenosis and a disc herniation at L4-L5 on the right.    Laboratory Data: Hospital Outpatient Visit on 03/09/2018  Component Date Value Ref Range Status  . Glucose-Capillary 03/09/2018 83  70 - 99 mg/dL Final  . aPTT 03/09/2018 37* 24 - 36 seconds Final   Comment:        IF BASELINE aPTT IS ELEVATED, SUGGEST PATIENT RISK ASSESSMENT BE USED TO DETERMINE APPROPRIATE ANTICOAGULANT THERAPY. Performed at [facility], [place] [address] [place], [place] [address]   . WBC 03/09/2018 7.7  4.0 - 10.5 K/uL Final  . RBC 03/09/2018 4.93  4.22 - 5.81 MIL/uL Final  . Hemoglobin 03/09/2018 14.4  13.0 - 17.0 g/dL Final  . HCT 03/09/2018 43.8  39.0 - 52.0 % Final  .  MCV 03/09/2018 88.8  78.0 - 100.0 fL Final  . MCH 03/09/2018 29.2  26.0 - 34.0 pg Final  . MCHC 03/09/2018 32.9  30.0 - 36.0 g/dL Final  . RDW 03/09/2018 15.1  11.5 - 15.5 % Final  . Platelets 03/09/2018 198  150 - 400 K/uL Final  . Neutrophils Relative % 03/09/2018 69  % Final  . Neutro Abs 03/09/2018 5.3  1.7 - 7.7 K/uL Final  . Lymphocytes Relative 03/09/2018 23  % Final  . Lymphs Abs 03/09/2018 1.7  0.7 - 4.0 K/uL Final  . Monocytes Relative 03/09/2018 7  % Final  . Monocytes Absolute 03/09/2018 0.6  0.1 - 1.0 K/uL Final  . Eosinophils Relative 03/09/2018 1  % Final  . Eosinophils Absolute 03/09/2018 0.1  0.0 - 0.7 K/uL Final  .  Basophils Relative 03/09/2018 0  % Final  . Basophils Absolute 03/09/2018 0.0  0.0 - 0.1 K/uL Final   Performed at [place] [address], [place], [place] [address]  . Sodium 03/09/2018 141  135 - 145 mmol/L Final  . Potassium 03/09/2018 4.6  3.5 - 5.1 mmol/L Final  . Chloride 03/09/2018 105  98 - 111 mmol/L Final  . CO2 03/09/2018 29  22 - 32 mmol/L Final  . Glucose, Bld 03/09/2018 111* 70 - 99 mg/dL Final  . BUN 03/09/2018 13  8 - 23 mg/dL Final  . Creatinine, Ser 03/09/2018 0.79  0.61 - 1.24 mg/dL Final  . Calcium 03/09/2018 9.1  8.9 - 10.3 mg/dL Final  . Total Protein 03/09/2018 6.5  6.5 - 8.1 g/dL Final  . Albumin 03/09/2018 4.1  3.5 - 5.0 g/dL Final  . AST 03/09/2018 28  15 - 41 U/L Final  . ALT 03/09/2018 24  0 - 44 U/L Final  . Alkaline Phosphatase 03/09/2018 41  38 - 126 U/L Final  . Total Bilirubin 03/09/2018 0.8  0.3 - 1.2 mg/dL Final  . GFR calc non Af Amer 03/09/2018 >60  >60 mL/min Final  . GFR calc Af Amer 03/09/2018 >60  >60 mL/min Final   Comment: (NOTE) The eGFR has been calculated using the CKD EPI equation. This calculation has not been validated in all clinical situations. eGFR's persistently <60 mL/min signify possible Chronic Kidney Disease.   Georgiann Hahn gap 03/09/2018 7  5 - 15 Final   Performed at [facility], [place] [address] [place], [place] [address]  . Prothrombin Time 03/09/2018 26.5* 11.4 - 15.2 seconds Final  . INR 03/09/2018 2.46   Final   Performed at [facility], [place] [address], [place], [place] [address]  . MRSA, PCR 03/09/2018 NEGATIVE  NEGATIVE Final  . Staphylococcus aureus 03/09/2018 NEGATIVE  NEGATIVE Final   Comment: (NOTE) The Xpert SA Assay (FDA approved for NASAL specimens in patients 55 years of age and older), is one component of a comprehensive surveillance program. It is not intended to diagnose infection nor to guide or monitor treatment. Performed at [facility] [place] [address], [place], [place] [address]   . Hgb A1c MFr Bld 03/09/2018 6.0* 4.8 - 5.6 % Final   Comment: (NOTE) Pre diabetes:          5.7%-6.4% Diabetes:              >6.4% Glycemic control for   <7.0% adults with diabetes   . Mean Plasma Glucose 03/09/2018 125.5  mg/dL Final   Performed at [facility], [address] [place], [place] [address]   No results for input(s): HGB in the last 72 hours. No results for input(s): WBC, RBC, HCT, PLT in the last 72 hours. No results for input(s): NA, K, CL, CO2, BUN, CREATININE, GLUCOSE, CALCIUM in the last 72 hours. Recent Labs    03/15/18 0655  INR 1.03    X-Rays:Dg Lumbar Spine 2-3 Views  Result Date: 03/09/2018 CLINICAL DATA:  Preop for lumbar surgery. EXAM: LUMBAR SPINE - 2-3 VIEW COMPARISON:  CT scan of February 13, 2018. FINDINGS: No fracture or spondylolisthesis is noted. Mild degenerative disc disease is noted at L2-3, L3-4 and L4-5 and L5-S1 with anterior osteophyte formation. IMPRESSION: Mild degenerative changes as described above. No acute abnormality seen in the lumbar spine. Electronically Signed   By: Marijo Conception, M.D.   On: 03/09/2018 17:25   Dg Spine Portable 1 View  Result Date: 03/15/2018 CLINICAL DATA:  Intraoperative localization film for patient undergoing lumbar surgery. EXAM: PORTABLE SPINE - 1 VIEW COMPARISON:  Intraoperative localization film earlier today. FINDINGS: Metallic probe is at the level of the L4 pedicles. IMPRESSION: As above. Electronically Signed   By: Inge Rise M.D.   On: 03/15/2018 13:21   Dg Spine Portable 1 View  Result Date: 03/15/2018 CLINICAL DATA:  Spinal surgery EXAM: PORTABLE SPINE - 1 VIEW COMPARISON:  None. FINDINGS: Single cross-table lateral x-ray of the lumbar spine is provided. Posterior tissue retractors are present. Metallic probe with the tip projecting over the posterior inferior corner of the L4 vertebral body. IMPRESSION: Intraoperative  localization as described above. Electronically Signed   By: Kathreen Devoid   On: 03/15/2018 11:42   Dg Spine Portable 1 View  Result Date: 03/15/2018 CLINICAL DATA:  Intraoperative localization film for patient undergoing L4-5 decompression. EXAM: PORTABLE SPINE - 1 VIEW COMPARISON:  Intraoperative localization films earlier today. FINDINGS: Single lateral view of the lumbar spine demonstrates clamps on the L3 and L4 spinous processes. Probe tip projects at the level of the inferior endplate of L4. IMPRESSION: Localization as above. Electronically Signed   By: Inge Rise M.D.   On: 03/15/2018 11:35   Dg Spine Portable 1 View  Result Date: 03/15/2018 CLINICAL DATA:  Intraoperative numbering spinal surgery EXAM: PORTABLE SPINE - 1 VIEW COMPARISON:  02/13/2018 FINDINGS: Standard lumbar numbering based on myelogram. Lateral images numbered 3 and 4 are submitted for review. As requested, the spine is numbered on both images. On the fourth image retractors overlap the L3-4 and L4-5 interspinous spaces. A probe projects at the posterior elements of L4. IMPRESSION: Intraoperative localization as described. Electronically Signed   By: Monte Fantasia M.D.   On: 03/15/2018 10:30   Dg Spine Portable 1 View  Result Date: 03/15/2018 CLINICAL DATA:  Intraoperative radiograph. EXAM: PORTABLE SPINE - 1 VIEW COMPARISON:  03/15/2018 FINDINGS: Single low resolution intraoperative lateral radiograph of the lumbosacral spine demonstrates instruments within the posterior soft tissues just inferior to the spinous processes of L2 and L3 vertebral bodies. IMPRESSION: Surgical instruments within the posterior soft tissues just inferior to the spinous processes of L2 and L3 vertebral bodies. Electronically Signed   By: Fidela Salisbury M.D.   On: 03/15/2018 09:40   Dg Spine Portable 1 View  Result Date: 03/15/2018 CLINICAL DATA:  Intraoperative radiograph. EXAM: PORTABLE SPINE - 1 VIEW COMPARISON:  03/09/2018 FINDINGS:  Single lateral low resolution radiograph of the lumbosacral spine demonstrates placement of surgical instruments within the posterior soft tissues at the level of inferior  aspect of the spinal process of L4 vertebral body and mid to inferior aspect of the spinous process of L5 vertebral body. IMPRESSION: Intraoperative radiograph for placement of surgical instruments. Electronically Signed   By: Fidela Salisbury M.D.   On: 03/15/2018 09:20    EKG: Orders placed or performed during the hospital encounter of 03/09/18  . EKG  . EKG     Hospital Course: Patient was admitted to [facility] and taken to the OR and underwent the above state procedure without complications.  Patient tolerated the procedure well and was later transferred to the recovery room and then to the orthopaedic floor for postoperative care.  They were given PO and IV analgesics for pain control following their surgery.  They were given 24 hours of postoperative antibiotics.   PT was consulted postop to assist with mobility and transfers.  The patient was allowed to be WBAT with therapy and was taught back precautions. Discharge planning was consulted to help with postop disposition and equipment needs.  Patient had a fair night on the evening of surgery and started to get up OOB with therapy on day one. Patient was seen in rounds and was ready to go home on day one. He did have some stability issues with therapy initially so HHPT was ordered.  They were given discharge instructions and dressing directions.  They were instructed on when to follow up in the office with Dr.Gioffre.   Diet: Cardiac diet and Diabetic diet Activity:WBAT Follow-up:in 2 weeks Disposition - Home Discharged Condition: stable   Discharge Instructions    Call MD / Call 911   Complete by:  As directed    If you experience chest pain or shortness of breath, CALL 911 and be transported to the hospital emergency room.  If you develope a fever above  101 F, pus (white drainage) or increased drainage or redness at the wound, or calf pain, call your surgeon's office.   Constipation Prevention   Complete by:  As directed    Drink plenty of fluids.  Prune juice may be helpful.  You may use a stool softener, such as Colace (over the counter) 100 mg twice a day.  Use MiraLax (over the counter) for constipation as needed.   Diet - low sodium heart healthy   Complete by:  As directed    Diet Carb Modified   Complete by:  As directed    Discharge instructions   Complete by:  As directed    For the first three days, remove your dressing, and tape a piece of saran wrap over your incision Take your shower, then remove the saran wrap and put a clean dressing on After three days you can shower without the saran wrap.  Call Dr. Gladstone Lighter if any wound complications or temperature of 101 degrees F or over.  No lifting or excessive bending No driving while taking pain medications.  Call the office for an appointment to see Dr. Gladstone Lighter in two weeks: [phone] and ask for Dr. Charlestine Night nurse, Brunilda Payor. Make appointment early next week to have PT/INR check for coumadin level correction   Increase activity slowly as tolerated   Complete by:  As directed      Allergies as of 03/16/2018      Reactions   Bee Venom Shortness Of Breath, Swelling   Codeine Phosphate Hives, Shortness Of Breath, Itching, Rash, Other (See Comments)   Tolerated multiple doses of IV morphine   Metoprolol Tartrate Other (See Comments)  loss of vision. Can take the XL tablet not regular    Ramipril Rash, Cough      Medication List    TAKE these medications   ACCU-CHEK FASTCLIX LANCETS Misc USE TO CHECK BLOOD SUGAR TWICE A DAY AND AS NEEDED   albuterol 108 (90 Base) MCG/ACT inhaler Commonly known as:  PROVENTIL HFA;VENTOLIN HFA INHALE 2 PUFFS INTO THE LUNGS EVERY 6 (SIX) HOURS AS NEEDED FOR WHEEZING OR SHORTNESS OF BREATH.   aspirin 81 MG tablet Take 81 mg by mouth  every evening.   atorvastatin 40 MG tablet Commonly known as:  LIPITOR TAKE 1 TABLET EVERY DAY AT 6 PM What changed:  See the new instructions.   calcium carbonate 600 MG Tabs tablet Commonly known as:  OS-CAL Take 600 mg by mouth 2 (two) times daily with a meal.   cetirizine 10 MG tablet Commonly known as:  ZYRTEC Take 10 mg by mouth daily.   Fish Oil 1200 MG Caps Take 1,200 mg by mouth daily.   furosemide 40 MG tablet Commonly known as:  LASIX TAKE 1 TABLET TWICE DAILY What changed:  when to take this   Glucosamine-Chondroitin 250-200 MG Caps Take 2 capsules by mouth daily.   glucose blood test strip USE TO CHECK BLOOD SUGAR TWICE A DAY AND PRN   guaiFENesin-dextromethorphan 100-10 MG/5ML syrup Commonly known as:  ROBITUSSIN DM Take 5 mLs by mouth every 4 (four) hours as needed for cough.   losartan 100 MG tablet Commonly known as:  COZAAR TAKE 1 TABLET EVERY DAY What changed:  when to take this   metFORMIN 1000 MG tablet Commonly known as:  GLUCOPHAGE Take 1,000 mg by mouth 2 (two) times daily with a meal.   methocarbamol 500 MG tablet Commonly known as:  ROBAXIN Take 1 tablet (500 mg total) by mouth every 6 (six) hours as needed for muscle spasms.   metoprolol succinate 100 MG 24 hr tablet Commonly known as:  TOPROL-XL TAKE 1 TABLET EVERY MORNING WITH OR IMMEDIATELY FOLLOWING A MEAL What changed:  See the new instructions.   multivitamin tablet Take 1 tablet by mouth daily.   nitroGLYCERIN 0.4 MG SL tablet Commonly known as:  NITROSTAT Place 1 tablet (0.4 mg total) under the tongue every 5 (five) minutes as needed. For chest pain   PHILLIPS COLON HEALTH PO Take 1 capsule by mouth daily.   potassium chloride 10 MEQ tablet Commonly known as:  K-DUR,KLOR-CON Take 10 mEq by mouth daily.   TART CHERRY ADVANCED Caps Take 2 capsules by mouth daily.   traMADol 50 MG tablet Commonly known as:  ULTRAM Take 1-2 tablets (50-100 mg total) by mouth every 6  (six) hours as needed. What changed:    how much to take  reasons to take this   TYLENOL ARTHRITIS PAIN 650 MG CR tablet Generic drug:  acetaminophen Take 1,300 mg by mouth 2 (two) times daily.   vitamin B-12 1000 MCG tablet Commonly known as:  CYANOCOBALAMIN Take 1,000 mcg by mouth daily.   vitamin C 500 MG tablet Commonly known as:  ASCORBIC ACID Take 1,000 mg by mouth daily.   warfarin 10 MG tablet Commonly known as:  COUMADIN Take as directed. If you are unsure how to take this medication, talk to your nurse or doctor. Original instructions:  TAKE AS DIRECTED BY COUMADIN CLINIC What changed:  See the new instructions.      Follow-up Information    [name], MD. Schedule an appointment as soon  as possible for a visit in 2 week(s).   Specialty:  Orthopedic Surgery Contact information: [address] [place] [address] [phone]        [facility] Follow up.   Specialty:  [place] Why:  For home health physical therapy Contact information: [place] [address] [phone]           Signed: Ardeen Jourdain, PA-C Orthopaedic Surgery 03/16/2018, 3:36 PM

## 2018-03-16 NOTE — Progress Notes (Signed)
   Subjective: 1 Day Post-Op Procedure(s) (LRB): Complete decompressive lumbar laminectomy for spinal stenosis of L4-L5 and foraminotomy for L4-L5 root on right (N/A) Patient reports pain as mild.   Patient seen in rounds for Dr. Gladstone Lighter Patient is well, and has had no acute complaints or problems other than some mild discomfort in his low back. No SOB or chest pain. Voiding well. Reports flatus. Plan is to go Home after hospital stay.  Objective: Vital signs in last 24 hours: Temp:  [97.7 F (36.5 C)-100.1 F (37.8 C)] 97.9 F (36.6 C) (09/05 0605) Pulse Rate:  [62-98] 95 (09/05 0605) Resp:  [10-17] 17 (09/05 0605) BP: (88-177)/(52-98) 101/56 (09/05 0605) SpO2:  [93 %-100 %] 93 % (09/05 0605)  Intake/Output from previous day:  Intake/Output Summary (Last 24 hours) at 03/16/2018 0738 Last data filed at 03/16/2018 0981 Gross per 24 hour  Intake 2349.81 ml  Output 1475 ml  Net 874.81 ml     Recent Labs    03/15/18 0655  INR 1.03    EXAM General - Patient is Alert and Oriented Extremity - Neurovascular intact Intact pulses distally Dorsiflexion/Plantar flexion intact Dressing/Incision - clean, dry Motor Function - intact, moving foot and toes well on exam. 5/5 in left LE. 4/5 in left EHL.   Past Medical History:  Diagnosis Date  . Acute renal failure ([place]) 02/09/2013  . Aortic stenosis 08/25/2010   a. Bentall aortic root replacement with a St. Jude mechanical valve and Hemashield conduit 02/2004.  Marland Kitchen Arthritis   . Asthma   . AV BLOCK, COMPLETE    a. s/p St Jude dual chamber pacemaker 02/2004.  Marland Kitchen CAD (coronary artery disease)    a. s/p CABGx2 (LIMA-dLAD, SVG-Cx). b. Low risk nuc 12/2012 without ischemia, EF 46% mild apical hypokinesia (EF 55% inf HK by echo).  . Carotid artery disease ([place])    a. 0-39% bilateral ICA stenosis, stable mild hard plaque in carotid bulbs. F/u 03/2014 recommended.  . Diabetes mellitus without complication ([place])    type 2   . DIVERTICULOSIS, COLON  10/17/2007  . Ejection fraction    a. EF 55% with inf HK, mild MR by echo 12/2012.  Marland Kitchen GERD (gastroesophageal reflux disease)    hxof   . GOUT 01/16/2007  . Headache    hx of migraines   . HEMORRHOIDS, INTERNAL 11/01/2008  . HYPERLIPIDEMIA 01/16/2007  . HYPERTENSION 01/16/2007  . Impaired glucose tolerance   . LEG EDEMA 11/27/2008  . Special screening for malignant neoplasm of prostate   . Warfarin anticoagulation    AVR    Assessment/Plan: 1 Day Post-Op Procedure(s) (LRB): Complete decompressive lumbar laminectomy for spinal stenosis of L4-L5 and foraminotomy for L4-L5 root on right (N/A) Active Problems:   Spinal stenosis, lumbar region with neurogenic claudication  Estimated body mass index is 34.02 kg/m as calculated from the following:   Height as of this encounter: 5' 6.5" (1.689 m).   Weight as of this encounter: 97.1 kg. Up with therapy D/C IV fluids when tolerating POs well  DVT Prophylaxis - None; will resume aspirin and coumadin therapy once home Weight-Bearing as tolerated  WE will have him get up and walk with therapy this morning. Plan for DC home today. Follow up in office in 2 weeks. Discharge instructions given.  Ardeen Jourdain, PA-C Orthopaedic Surgery 03/16/2018, 7:38 AM

## 2018-03-16 NOTE — Progress Notes (Signed)
Physical Therapy Treatment Patient Details Name: JAKEIM SEDORE MRN: [id number] DOB: 02-25-1946 Today's Date: 03/16/2018    History of Present Illness s/p lumbar decompression for stenosis, H/O PPM    PT Comments    The patient demonstrates decreased balance responses with mild loss x 2 while turning around. Wife not present. Recommend patient have  Assist for all ambulation. Will ambulate again and use shoes. Recommend HHPT   Follow Up Recommendations  Home health PT     Equipment Recommendations  None recommended by PT    Recommendations for Other Services       Precautions / Restrictions Precautions Precautions: Back Precaution Comments: reviewed back precautions    Mobility  Bed Mobility               General bed mobility comments: in chair  Transfers                 General transfer comment: in chair  Ambulation/Gait Ambulation/Gait assistance: Min assist Gait Distance (Feet): 120 Feet Assistive device: Rolling walker (2 wheeled) Gait Pattern/deviations: Step-to pattern;Shuffle;Narrow base of support;Decreased stride length;Festinating     General Gait Details: shuffling gait, mildly festinating, balance loss x 2 with turns   Stairs             Wheelchair Mobility    Modified Rankin (Stroke Patients Only)       Balance Overall balance assessment: Needs assistance Sitting-balance support: Feet supported;Bilateral upper extremity supported Sitting balance-Leahy Scale: Good     Standing balance support: During functional activity;Bilateral upper extremity supported Standing balance-Leahy Scale: Poor                              Cognition Arousal/Alertness: Awake/alert Behavior During Therapy: Flat affect                                   General Comments: slow processing at times      Exercises      General Comments        Pertinent Vitals/Pain Pain Score: 4  Pain Location: back Pain  Descriptors / Indicators: Aching Pain Intervention(s): Monitored during session;Premedicated before session    Home Living                      Prior Function            PT Goals (current goals can now be found in the care plan section) Progress towards PT goals: Progressing toward goals    Frequency    Min 5X/week      PT Plan Current plan remains appropriate    Co-evaluation              AM-PAC PT "6 Clicks" Daily Activity  Outcome Measure  Difficulty turning over in bed (including adjusting bedclothes, sheets and blankets)?: Unable Difficulty moving from lying on back to sitting on the side of the bed? : Unable Difficulty sitting down on and standing up from a chair with arms (e.g., wheelchair, bedside commode, etc,.)?: A Lot Help needed moving to and from a bed to chair (including a wheelchair)?: A Lot Help needed walking in hospital room?: A Lot Help needed climbing 3-5 steps with a railing? : Total 6 Click Score: 9    End of Session Equipment Utilized During Treatment: Gait belt Activity Tolerance: Patient tolerated treatment well  Patient left: in chair;with call bell/phone within reach;with chair alarm set Nurse Communication: Mobility status PT Visit Diagnosis: Unsteadiness on feet (R26.81)     Time: 1610-9604 PT Time Calculation (min) (ACUTE ONLY): 21 min  Charges:  $Gait Training: 8-22 mins                     Villa Pancho PT [phone]    Claretha Cooper 03/16/2018, 12:44 PM

## 2018-03-16 NOTE — Progress Notes (Signed)
Patient up to chair yesterday, but refused ambulation in evening and this a.m.

## 2018-03-16 NOTE — Progress Notes (Signed)
PT note- spoke with patient's wife to recommend that patient have assistance to ambulate at present due to decreased balance. Recommend HHPT. patine plans Dc today. Tresa Endo PT [phone]

## 2018-03-19 DIAGNOSIS — I2510 Atherosclerotic heart disease of native coronary artery without angina pectoris: Secondary | ICD-10-CM | POA: Diagnosis not present

## 2018-03-19 DIAGNOSIS — J45909 Unspecified asthma, uncomplicated: Secondary | ICD-10-CM | POA: Diagnosis not present

## 2018-03-19 DIAGNOSIS — I441 Atrioventricular block, second degree: Secondary | ICD-10-CM | POA: Diagnosis not present

## 2018-03-19 DIAGNOSIS — Z4789 Encounter for other orthopedic aftercare: Secondary | ICD-10-CM | POA: Diagnosis not present

## 2018-03-19 DIAGNOSIS — E1139 Type 2 diabetes mellitus with other diabetic ophthalmic complication: Secondary | ICD-10-CM | POA: Diagnosis not present

## 2018-03-19 DIAGNOSIS — M109 Gout, unspecified: Secondary | ICD-10-CM | POA: Diagnosis not present

## 2018-03-19 DIAGNOSIS — M48062 Spinal stenosis, lumbar region with neurogenic claudication: Secondary | ICD-10-CM | POA: Diagnosis not present

## 2018-03-19 DIAGNOSIS — I051 Rheumatic mitral insufficiency: Secondary | ICD-10-CM | POA: Diagnosis not present

## 2018-03-19 DIAGNOSIS — I10 Essential (primary) hypertension: Secondary | ICD-10-CM | POA: Diagnosis not present

## 2018-03-21 ENCOUNTER — Ambulatory Visit (INDEPENDENT_AMBULATORY_CARE_PROVIDER_SITE_OTHER): Payer: Medicare HMO

## 2018-03-21 DIAGNOSIS — Z5181 Encounter for therapeutic drug level monitoring: Secondary | ICD-10-CM

## 2018-03-21 DIAGNOSIS — Z09 Encounter for follow-up examination after completed treatment for conditions other than malignant neoplasm: Secondary | ICD-10-CM

## 2018-03-21 DIAGNOSIS — Z952 Presence of prosthetic heart valve: Secondary | ICD-10-CM | POA: Diagnosis not present

## 2018-03-21 LAB — POCT INR: INR: 2.4 (ref 2.0–3.0)

## 2018-03-21 NOTE — Patient Instructions (Signed)
Continue dosage of 1 tablet every day. Recheck in 4 weeks.  Call with any changes., # [phone].

## 2018-03-24 DIAGNOSIS — J45909 Unspecified asthma, uncomplicated: Secondary | ICD-10-CM | POA: Diagnosis not present

## 2018-03-24 DIAGNOSIS — I10 Essential (primary) hypertension: Secondary | ICD-10-CM | POA: Diagnosis not present

## 2018-03-24 DIAGNOSIS — I2510 Atherosclerotic heart disease of native coronary artery without angina pectoris: Secondary | ICD-10-CM | POA: Diagnosis not present

## 2018-03-24 DIAGNOSIS — M109 Gout, unspecified: Secondary | ICD-10-CM | POA: Diagnosis not present

## 2018-03-24 DIAGNOSIS — I051 Rheumatic mitral insufficiency: Secondary | ICD-10-CM | POA: Diagnosis not present

## 2018-03-24 DIAGNOSIS — M48062 Spinal stenosis, lumbar region with neurogenic claudication: Secondary | ICD-10-CM | POA: Diagnosis not present

## 2018-03-24 DIAGNOSIS — I441 Atrioventricular block, second degree: Secondary | ICD-10-CM | POA: Diagnosis not present

## 2018-03-24 DIAGNOSIS — E1139 Type 2 diabetes mellitus with other diabetic ophthalmic complication: Secondary | ICD-10-CM | POA: Diagnosis not present

## 2018-03-24 DIAGNOSIS — Z4789 Encounter for other orthopedic aftercare: Secondary | ICD-10-CM | POA: Diagnosis not present

## 2018-03-27 DIAGNOSIS — I10 Essential (primary) hypertension: Secondary | ICD-10-CM | POA: Diagnosis not present

## 2018-03-27 DIAGNOSIS — I2510 Atherosclerotic heart disease of native coronary artery without angina pectoris: Secondary | ICD-10-CM | POA: Diagnosis not present

## 2018-03-27 DIAGNOSIS — M48062 Spinal stenosis, lumbar region with neurogenic claudication: Secondary | ICD-10-CM | POA: Diagnosis not present

## 2018-03-27 DIAGNOSIS — M109 Gout, unspecified: Secondary | ICD-10-CM | POA: Diagnosis not present

## 2018-03-27 DIAGNOSIS — J45909 Unspecified asthma, uncomplicated: Secondary | ICD-10-CM | POA: Diagnosis not present

## 2018-03-27 DIAGNOSIS — Z4789 Encounter for other orthopedic aftercare: Secondary | ICD-10-CM | POA: Diagnosis not present

## 2018-03-27 DIAGNOSIS — E1139 Type 2 diabetes mellitus with other diabetic ophthalmic complication: Secondary | ICD-10-CM | POA: Diagnosis not present

## 2018-03-27 DIAGNOSIS — I441 Atrioventricular block, second degree: Secondary | ICD-10-CM | POA: Diagnosis not present

## 2018-03-27 DIAGNOSIS — I051 Rheumatic mitral insufficiency: Secondary | ICD-10-CM | POA: Diagnosis not present

## 2018-03-29 DIAGNOSIS — J45909 Unspecified asthma, uncomplicated: Secondary | ICD-10-CM | POA: Diagnosis not present

## 2018-03-29 DIAGNOSIS — I2510 Atherosclerotic heart disease of native coronary artery without angina pectoris: Secondary | ICD-10-CM | POA: Diagnosis not present

## 2018-03-29 DIAGNOSIS — Z4789 Encounter for other orthopedic aftercare: Secondary | ICD-10-CM | POA: Diagnosis not present

## 2018-03-29 DIAGNOSIS — I441 Atrioventricular block, second degree: Secondary | ICD-10-CM | POA: Diagnosis not present

## 2018-03-29 DIAGNOSIS — I051 Rheumatic mitral insufficiency: Secondary | ICD-10-CM | POA: Diagnosis not present

## 2018-03-29 DIAGNOSIS — M109 Gout, unspecified: Secondary | ICD-10-CM | POA: Diagnosis not present

## 2018-03-29 DIAGNOSIS — I10 Essential (primary) hypertension: Secondary | ICD-10-CM | POA: Diagnosis not present

## 2018-03-29 DIAGNOSIS — M48062 Spinal stenosis, lumbar region with neurogenic claudication: Secondary | ICD-10-CM | POA: Diagnosis not present

## 2018-03-29 DIAGNOSIS — E1139 Type 2 diabetes mellitus with other diabetic ophthalmic complication: Secondary | ICD-10-CM | POA: Diagnosis not present

## 2018-03-30 ENCOUNTER — Encounter: Payer: Self-pay | Admitting: Internal Medicine

## 2018-03-30 ENCOUNTER — Ambulatory Visit (INDEPENDENT_AMBULATORY_CARE_PROVIDER_SITE_OTHER): Payer: Medicare HMO | Admitting: Internal Medicine

## 2018-03-30 VITALS — BP 126/70 | HR 78 | Temp 97.8°F | Wt 221.4 lb

## 2018-03-30 DIAGNOSIS — E785 Hyperlipidemia, unspecified: Secondary | ICD-10-CM | POA: Diagnosis not present

## 2018-03-30 DIAGNOSIS — I2510 Atherosclerotic heart disease of native coronary artery without angina pectoris: Secondary | ICD-10-CM

## 2018-03-30 DIAGNOSIS — Z951 Presence of aortocoronary bypass graft: Secondary | ICD-10-CM

## 2018-03-30 DIAGNOSIS — E1139 Type 2 diabetes mellitus with other diabetic ophthalmic complication: Secondary | ICD-10-CM

## 2018-03-30 DIAGNOSIS — E119 Type 2 diabetes mellitus without complications: Secondary | ICD-10-CM | POA: Diagnosis not present

## 2018-03-30 DIAGNOSIS — Z9889 Other specified postprocedural states: Secondary | ICD-10-CM

## 2018-03-30 DIAGNOSIS — IMO0001 Reserved for inherently not codable concepts without codable children: Secondary | ICD-10-CM

## 2018-03-30 DIAGNOSIS — R609 Edema, unspecified: Secondary | ICD-10-CM | POA: Diagnosis not present

## 2018-03-30 DIAGNOSIS — Z23 Encounter for immunization: Secondary | ICD-10-CM

## 2018-03-30 NOTE — Progress Notes (Signed)
Subjective:    Patient ID: [name], male    DOB: [birth year], 72 y.o.   MRN: [id number]  HPI 72 year old patient who is seen today for follow-up.  He is status post laminectomy for spinal stenosis 2 weeks ago.  He continues with physical therapy and is making some nice progress. He has had some increase in lower extremity edema  He has coronary artery disease and is status post mechanical valve for aortic stenosis.  He is status post pacemaker  Cardiac status has been stable.  No increase in shortness of breath he is using incentive spirometer faithfully postop  He has a history of type 2 diabetes controlled with metformin therapy.  Recent hemoglobin A1c as an inpatient 6.0  Past Medical History:  Diagnosis Date  . Acute renal failure ([place]) 02/09/2013  . Aortic stenosis 08/25/2010   a. Bentall aortic root replacement with a St. Jude mechanical valve and Hemashield conduit 02/2004.  Marland Kitchen Arthritis   . Asthma   . AV BLOCK, COMPLETE    a. s/p St Jude dual chamber pacemaker 02/2004.  Marland Kitchen CAD (coronary artery disease)    a. s/p CABGx2 (LIMA-dLAD, SVG-Cx). b. Low risk nuc 12/2012 without ischemia, EF 46% mild apical hypokinesia (EF 55% inf HK by echo).  . Carotid artery disease ([place])    a. 0-39% bilateral ICA stenosis, stable mild hard plaque in carotid bulbs. F/u 03/2014 recommended.  . Diabetes mellitus without complication ([place])    type 2   . DIVERTICULOSIS, COLON 10/17/2007  . Ejection fraction    a. EF 55% with inf HK, mild MR by echo 12/2012.  Marland Kitchen GERD (gastroesophageal reflux disease)    hxof   . GOUT 01/16/2007  . Headache    hx of migraines   . HEMORRHOIDS, INTERNAL 11/01/2008  . HYPERLIPIDEMIA 01/16/2007  . HYPERTENSION 01/16/2007  . Impaired glucose tolerance   . LEG EDEMA 11/27/2008  . Special screening for malignant neoplasm of prostate   . Warfarin anticoagulation    AVR     Social History   Socioeconomic History  . Marital status: Married    Spouse name: Not on file  .  Number of children: 3  . Years of education: Not on file  . Highest education level: Not on file  Occupational History  . Occupation: retired  Scientific laboratory technician  . Financial resource strain: Not on file  . Food insecurity:    Worry: Not on file    Inability: Not on file  . Transportation needs:    Medical: Not on file    Non-medical: Not on file  Tobacco Use  . Smoking status: Former Smoker    Last attempt to quit: 07/12/1973    Years since quitting: 44.7  . Smokeless tobacco: Never Used  Substance and Sexual Activity  . Alcohol use: No  . Drug use: No  . Sexual activity: Never  Lifestyle  . Physical activity:    Days per week: Not on file    Minutes per session: Not on file  . Stress: Not on file  Relationships  . Social connections:    Talks on phone: Not on file    Gets together: Not on file    Attends religious service: Not on file    Active member of club or organization: Not on file    Attends meetings of clubs or organizations: Not on file    Relationship status: Not on file  . Intimate partner violence:    Fear of  current or ex partner: Not on file    Emotionally abused: Not on file    Physically abused: Not on file    Forced sexual activity: Not on file  Other Topics Concern  . Not on file  Social History Narrative  . Not on file    Past Surgical History:  Procedure Laterality Date  . AORTIC VALVE REPLACEMENT    . CARDIAC CATHETERIZATION  06/2003  . CIRCUMCISION    . COLONOSCOPY    . CORONARY ARTERY BYPASS GRAFT    . coronary stents      prior to bypass   . EP IMPLANTABLE DEVICE N/A 05/06/2016   Procedure: PPM Generator Changeout;  Surgeon: Evans Lance, MD;  Location: [facility];  Service: Cardiovascular;  Laterality: N/A;  . ESOPHAGOGASTRODUODENOSCOPY    . HERNIA REPAIR    . INGUINAL HERNIA REPAIR Bilateral 02/07/2013   Procedure: LAPAROSCOPIC BILATERAL INGUINAL HERNIA REPAIR;  Surgeon: Imogene Burn. Georgette Dover, MD;  Location: [facility];  Service:  General;  Laterality: Bilateral;  . INSERTION OF MESH Bilateral 02/07/2013   Procedure: INSERTION OF MESH;  Surgeon: Imogene Burn. Georgette Dover, MD;  Location: [facility];  Service: General;  Laterality: Bilateral;  . LUMBAR LAMINECTOMY/DECOMPRESSION MICRODISCECTOMY N/A 03/15/2018   Procedure: Complete decompressive lumbar laminectomy for spinal stenosis of L4-L5 and foraminotomy for L4-L5 root on right;  Surgeon: [name], MD;  Location: [facility];  Service: Orthopedics;  Laterality: N/A;  . PACEMAKER INSERTION    . SHOULDER SURGERY Bilateral     Family History  Problem Relation Age of Onset  . Heart attack Father   . Kidney cancer Father   . Hypertension Mother   . Diabetes Mother   . Diabetes Brother   . Epilepsy Sister   . Diabetes Sister   . Diabetes Brother     Allergies  Allergen Reactions  . Bee Venom Shortness Of Breath and Swelling  . Codeine Phosphate Hives, Shortness Of Breath, Itching, Rash and Other (See Comments)    Tolerated multiple doses of IV morphine  . Metoprolol Tartrate Other (See Comments)    loss of vision. Can take the XL tablet not regular   . Tramadol     Pt has hallucinations   . Ramipril Rash and Cough    Current Outpatient Medications on File Prior to Visit  Medication Sig Dispense Refill  . ACCU-CHEK FASTCLIX LANCETS MISC USE TO CHECK BLOOD SUGAR TWICE A DAY AND AS NEEDED 306 each 3  . acetaminophen (TYLENOL ARTHRITIS PAIN) 650 MG CR tablet Take 1,300 mg by mouth 2 (two) times daily.    Marland Kitchen albuterol (PROVENTIL HFA;VENTOLIN HFA) 108 (90 Base) MCG/ACT inhaler INHALE 2 PUFFS INTO THE LUNGS EVERY 6 (SIX) HOURS AS NEEDED FOR WHEEZING OR SHORTNESS OF BREATH. 54 g 3  . Ascorbic Acid (VITAMIN C) 500 MG tablet Take 1,000 mg by mouth daily.     Marland Kitchen aspirin 81 MG tablet Take 81 mg by mouth every evening.     Marland Kitchen atorvastatin (LIPITOR) 40 MG tablet TAKE 1 TABLET EVERY DAY AT 6 PM (Patient taking differently: Take 40 mg by mouth every evening. ) 90 tablet 2  . calcium  carbonate (OS-CAL) 600 MG TABS Take 600 mg by mouth 2 (two) times daily with a meal.    . cetirizine (ZYRTEC) 10 MG tablet Take 10 mg by mouth daily.     . furosemide (LASIX) 40 MG tablet TAKE 1 TABLET TWICE DAILY (Patient taking differently: Take 40 mg by  mouth daily. ) 180 tablet 1  . Glucosamine-Chondroitin 250-200 MG CAPS Take 2 capsules by mouth daily.     Marland Kitchen glucose blood (ACCU-CHEK SMARTVIEW) test strip USE TO CHECK BLOOD SUGAR TWICE A DAY AND PRN 300 each 3  . guaiFENesin-dextromethorphan (ROBITUSSIN DM) 100-10 MG/5ML syrup Take 5 mLs by mouth every 4 (four) hours as needed for cough.    . losartan (COZAAR) 100 MG tablet TAKE 1 TABLET EVERY DAY (Patient taking differently: Take 100 mg by mouth every evening. ) 90 tablet 2  . metFORMIN (GLUCOPHAGE) 1000 MG tablet Take 1,000 mg by mouth 2 (two) times daily with a meal.    . methocarbamol (ROBAXIN) 500 MG tablet Take 1 tablet (500 mg total) by mouth every 6 (six) hours as needed for muscle spasms. 40 tablet 0  . metoprolol succinate (TOPROL-XL) 100 MG 24 hr tablet TAKE 1 TABLET EVERY MORNING WITH OR IMMEDIATELY FOLLOWING A MEAL (Patient taking differently: Take 100 mg by mouth daily. ) 90 tablet 2  . Misc Natural Products (TART CHERRY ADVANCED) CAPS Take 2 capsules by mouth daily.     . Multiple Vitamin (MULTIVITAMIN) tablet Take 1 tablet by mouth daily.      . nitroGLYCERIN (NITROSTAT) 0.4 MG SL tablet Place 1 tablet (0.4 mg total) under the tongue every 5 (five) minutes as needed. For chest pain 25 tablet 3  . Omega-3 Fatty Acids (FISH OIL) 1200 MG CAPS Take 1,200 mg by mouth daily.     . potassium chloride (K-DUR,KLOR-CON) 10 MEQ tablet Take 10 mEq by mouth daily.     . Probiotic Product (PHILLIPS COLON HEALTH PO) Take 1 capsule by mouth daily.    . traMADol (ULTRAM) 50 MG tablet Take 1-2 tablets (50-100 mg total) by mouth every 6 (six) hours as needed. 60 tablet 0  . vitamin B-12 (CYANOCOBALAMIN) 1000 MCG tablet Take 1,000 mcg by mouth  daily.    Marland Kitchen warfarin (COUMADIN) 10 MG tablet TAKE AS DIRECTED BY COUMADIN CLINIC (Patient taking differently: Take 10 mg by mouth daily with supper. ) 90 tablet 1   No current facility-administered medications on file prior to visit.     BP 126/70 (BP Location: Right Arm, Patient Position: Sitting, Cuff Size: Large)   Pulse 78   Temp 97.8 F (36.6 C) (Oral)   Wt 221 lb 6.4 oz (100.4 kg)   SpO2 100%   BMI 35.20 kg/m      Review of Systems  Constitutional: Negative for appetite change, chills, fatigue and fever.  HENT: Negative for congestion, dental problem, ear pain, hearing loss, sore throat, tinnitus, trouble swallowing and voice change.   Eyes: Negative for pain, discharge and visual disturbance.  Respiratory: Negative for cough, chest tightness, wheezing and stridor.   Cardiovascular: Positive for leg swelling. Negative for chest pain and palpitations.  Gastrointestinal: Negative for abdominal distention, abdominal pain, blood in stool, constipation, diarrhea, nausea and vomiting.  Genitourinary: Negative for difficulty urinating, discharge, flank pain, genital sores, hematuria and urgency.  Musculoskeletal: Negative for arthralgias, back pain, gait problem, joint swelling, myalgias and neck stiffness.  Skin: Negative for rash.  Neurological: Negative for dizziness, syncope, speech difficulty, weakness, numbness and headaches.  Hematological: Negative for adenopathy. Does not bruise/bleed easily.  Psychiatric/Behavioral: Negative for behavioral problems and dysphoric mood. The patient is not nervous/anxious.        Objective:   Physical Exam  Constitutional: He is oriented to person, place, and time. He appears well-developed.  Blood pressure low normal Weight  221  HENT:  Head: Normocephalic.  Right Ear: External ear normal.  Left Ear: External ear normal.  Eyes: Conjunctivae and EOM are normal.  Neck: Normal range of motion.  Cardiovascular: Normal rate and normal  heart sounds.  Bioprosthetic heart sounds  Pulmonary/Chest: Breath sounds normal. He has no rales.  Abdominal: Bowel sounds are normal.  Musculoskeletal: Normal range of motion. He exhibits edema. He exhibits no tenderness.  +2 lower extremity edema  Neurological: He is alert and oriented to person, place, and time.  Psychiatric: He has a normal mood and affect. His behavior is normal.          Assessment & Plan:   Diabetes mellitus type 2 well-controlled on metformin therapy.  Recent hemoglobin A1c 6.0 Status post laminectomy Lower extremity edema.  Will increase furosemide to a twice daily regimen until fluid controlled then decrease to once daily Coronary artery disease Essential hypertension stable  Patient will follow up with a new provider in 3 to 6 months  Marletta Lor

## 2018-03-30 NOTE — Patient Instructions (Addendum)
Limit your sodium (Salt) intake    It is important that you exercise regularly, at least 20 minutes 3 to 4 times per week.  If you develop chest pain or shortness of breath seek  medical attention.   Please check your hemoglobin A1c every 3-6 months  Increase furosemide along with potassium supplement to a twice daily regimen until lower extremity swelling has improved.  Then resume once daily dosing in the morning

## 2018-04-04 DIAGNOSIS — M109 Gout, unspecified: Secondary | ICD-10-CM | POA: Diagnosis not present

## 2018-04-04 DIAGNOSIS — I2510 Atherosclerotic heart disease of native coronary artery without angina pectoris: Secondary | ICD-10-CM | POA: Diagnosis not present

## 2018-04-04 DIAGNOSIS — I10 Essential (primary) hypertension: Secondary | ICD-10-CM | POA: Diagnosis not present

## 2018-04-04 DIAGNOSIS — Z4789 Encounter for other orthopedic aftercare: Secondary | ICD-10-CM | POA: Diagnosis not present

## 2018-04-04 DIAGNOSIS — E1139 Type 2 diabetes mellitus with other diabetic ophthalmic complication: Secondary | ICD-10-CM | POA: Diagnosis not present

## 2018-04-04 DIAGNOSIS — I051 Rheumatic mitral insufficiency: Secondary | ICD-10-CM | POA: Diagnosis not present

## 2018-04-04 DIAGNOSIS — J45909 Unspecified asthma, uncomplicated: Secondary | ICD-10-CM | POA: Diagnosis not present

## 2018-04-04 DIAGNOSIS — I441 Atrioventricular block, second degree: Secondary | ICD-10-CM | POA: Diagnosis not present

## 2018-04-04 DIAGNOSIS — M48062 Spinal stenosis, lumbar region with neurogenic claudication: Secondary | ICD-10-CM | POA: Diagnosis not present

## 2018-04-10 ENCOUNTER — Other Ambulatory Visit: Payer: Self-pay | Admitting: Internal Medicine

## 2018-04-18 ENCOUNTER — Ambulatory Visit (INDEPENDENT_AMBULATORY_CARE_PROVIDER_SITE_OTHER): Payer: Medicare HMO | Admitting: *Deleted

## 2018-04-18 DIAGNOSIS — Z09 Encounter for follow-up examination after completed treatment for conditions other than malignant neoplasm: Secondary | ICD-10-CM | POA: Diagnosis not present

## 2018-04-18 DIAGNOSIS — Z952 Presence of prosthetic heart valve: Secondary | ICD-10-CM

## 2018-04-18 DIAGNOSIS — Z5181 Encounter for therapeutic drug level monitoring: Secondary | ICD-10-CM

## 2018-04-18 DIAGNOSIS — I350 Nonrheumatic aortic (valve) stenosis: Secondary | ICD-10-CM | POA: Diagnosis not present

## 2018-04-18 LAB — POCT INR: INR: 2.4 (ref 2.0–3.0)

## 2018-04-18 NOTE — Patient Instructions (Signed)
Description   Continue dosage of 1 tablet every day. Recheck in 4 weeks.  Call with any changes., # [phone].

## 2018-04-25 ENCOUNTER — Other Ambulatory Visit: Payer: Self-pay | Admitting: Internal Medicine

## 2018-04-25 MED ORDER — ACCU-CHEK FASTCLIX LANCETS MISC: 3 refills | Status: DC

## 2018-04-25 NOTE — Telephone Encounter (Signed)
Copied from [place] [id number]. Topic: Quick Communication - Rx Refill/Question >> Apr 25, 2018 10:15 AM Keene Breath wrote: Medication: ACCU-CHEK FASTCLIX LANCETS MISC / glucose blood (ACCU-CHEK SMARTVIEW) test strip  Patient called to request a refill for the above medications.  CB# [phone]  Preferred Pharmacy (with phone number or street name): [place], [place] [phone] (Phone) [phone] (Fax)

## 2018-04-25 NOTE — Telephone Encounter (Signed)
Requested medication (s) are due for refill today: yes  Requested medication (s) are on the active medication list: yes  Last refill: 03/30/18  Future visit scheduled: yes with [name]   Notes to clinic: pt last seen by Dr Clarisse Gouge   Requested Prescriptions  Pending Prescriptions Disp Refills   ACCU-CHEK FASTCLIX LANCETS [place] 15 each 3     Endocrinology: Diabetes - Testing Supplies Passed - 04/25/2018 11:35 AM      Passed - Valid encounter within last 12 months    Recent Outpatient Visits          3 weeks ago Diabetes mellitus without complication ([place])   [facility], Doretha Sou, MD   6 months ago Diabetes mellitus without complication ([place])   [place] [facility], Doretha Sou, MD   1 year ago Need for influenza vaccination   [place] at [facility], Doretha Sou, MD   1 year ago Encounter for preventive health examination   [facility] at [facility], Doretha Sou, MD   1 year ago Respiratory infection   Therap[facility], [facility] at [facility], Nickola Major, DO      Future Appointments            In 5 months Nafziger, Tommi Rumps, NP [facility] at [place], [place] [facility]

## 2018-04-26 ENCOUNTER — Telehealth: Payer: Self-pay | Admitting: Internal Medicine

## 2018-04-26 MED ORDER — ACCU-CHEK FASTCLIX LANCETS MISC: 3 refills | Status: DC

## 2018-04-26 NOTE — Telephone Encounter (Signed)
Copied from [place] [id number]. Topic: Quick Communication - Rx Refill/Question >> Apr 26, 2018  9:35 AM Carolyn Stare wrote: Accu check went to [facility] and pt need the below RX'S to go to [facility]     please resend    Medication    ACCU-CHEK FASTCLIX LANCETS MISC    glucose blood (ACCU-CHEK SMARTVIEW) test strip    [facility] Order

## 2018-04-26 NOTE — Telephone Encounter (Signed)
Rx sent 

## 2018-05-01 ENCOUNTER — Other Ambulatory Visit: Payer: Self-pay

## 2018-05-01 MED ORDER — GLUCOSE BLOOD VI STRP: ORAL_STRIP | 3 refills | Status: DC

## 2018-05-10 ENCOUNTER — Telehealth: Payer: Self-pay | Admitting: Cardiology

## 2018-05-10 ENCOUNTER — Ambulatory Visit (INDEPENDENT_AMBULATORY_CARE_PROVIDER_SITE_OTHER): Payer: Medicare HMO | Admitting: *Deleted

## 2018-05-10 DIAGNOSIS — I442 Atrioventricular block, complete: Secondary | ICD-10-CM

## 2018-05-10 NOTE — Telephone Encounter (Signed)
LMOVM reminding pt to send remote transmission.   

## 2018-05-12 NOTE — Progress Notes (Signed)
Remote pacemaker transmission.   

## 2018-05-15 DIAGNOSIS — M25561 Pain in right knee: Secondary | ICD-10-CM | POA: Diagnosis not present

## 2018-05-15 DIAGNOSIS — Z4789 Encounter for other orthopedic aftercare: Secondary | ICD-10-CM | POA: Diagnosis not present

## 2018-05-16 ENCOUNTER — Ambulatory Visit: Payer: Medicare HMO | Admitting: *Deleted

## 2018-05-16 DIAGNOSIS — Z5181 Encounter for therapeutic drug level monitoring: Secondary | ICD-10-CM | POA: Diagnosis not present

## 2018-05-16 DIAGNOSIS — Z09 Encounter for follow-up examination after completed treatment for conditions other than malignant neoplasm: Secondary | ICD-10-CM

## 2018-05-16 DIAGNOSIS — Z952 Presence of prosthetic heart valve: Secondary | ICD-10-CM

## 2018-05-16 LAB — POCT INR: INR: 2.4 (ref 2.0–3.0)

## 2018-05-16 NOTE — Patient Instructions (Signed)
Description   Continue dosage of 1 tablet every day. Recheck in 6 weeks. Call with any changes., # [phone].      

## 2018-05-19 ENCOUNTER — Other Ambulatory Visit: Payer: Self-pay | Admitting: Internal Medicine

## 2018-05-19 ENCOUNTER — Encounter: Payer: Self-pay | Admitting: Cardiology

## 2018-05-19 NOTE — Telephone Encounter (Signed)
Okay for refill on Warfarin? Disregard the Furosemide I will send this to pt PCP after you review it.

## 2018-05-19 NOTE — Telephone Encounter (Signed)
Please refill.

## 2018-06-05 ENCOUNTER — Other Ambulatory Visit: Payer: Self-pay | Admitting: Internal Medicine

## 2018-06-07 ENCOUNTER — Encounter: Payer: Medicare HMO | Admitting: Internal Medicine

## 2018-06-07 ENCOUNTER — Encounter: Payer: Self-pay | Admitting: Internal Medicine

## 2018-06-07 ENCOUNTER — Ambulatory Visit: Payer: Medicare HMO | Admitting: Internal Medicine

## 2018-06-07 VITALS — BP 132/82 | HR 80 | Ht 66.5 in | Wt 228.0 lb

## 2018-06-07 DIAGNOSIS — I10 Essential (primary) hypertension: Secondary | ICD-10-CM | POA: Diagnosis not present

## 2018-06-07 DIAGNOSIS — I442 Atrioventricular block, complete: Secondary | ICD-10-CM

## 2018-06-07 DIAGNOSIS — I5030 Unspecified diastolic (congestive) heart failure: Secondary | ICD-10-CM | POA: Diagnosis not present

## 2018-06-07 DIAGNOSIS — Z951 Presence of aortocoronary bypass graft: Secondary | ICD-10-CM

## 2018-06-07 DIAGNOSIS — Z950 Presence of cardiac pacemaker: Secondary | ICD-10-CM

## 2018-06-07 NOTE — Patient Instructions (Signed)
Medication Instructions:  Your physician recommends that you continue on your current medications as directed. Please refer to the Current Medication list given to you today.  Labwork: None ordered.  Testing/Procedures: None ordered.  Follow-Up: Your physician wants you to follow-up in: one year with Dr. Lovena Le.   You will receive a reminder letter in the mail two months in advance. If you don't receive a letter, please call our office to schedule the follow-up appointment.  Remote monitoring is used to monitor your Pacemaker from home. This monitoring reduces the number of office visits required to check your device to one time per year. It allows [place] to keep an eye on the functioning of your device to ensure it is working properly. You are scheduled for a device check from home on 08/09/2018. You may send your transmission at any time that day. If you have a wireless device, the transmission will be sent automatically. After your physician reviews your transmission, you will receive a postcard with your next transmission date.  Any Other Special Instructions Will Be Listed Below (If Applicable).  If you need a refill on your cardiac medications before your next appointment, please call your pharmacy.

## 2018-06-07 NOTE — Progress Notes (Signed)
HPI [name] returns today for ongoing evaluation and management of complete heart block after aortic root and aortic valve replacement, status post pacemaker insertion. In the interim, he has lost 5 pounds. His diabetes has been well-controlled. He remains active walking his dog. He denies chest pain or shortness of breath. Minimal peripheral edema. He does have chronic knee pain and is considering knee replacement surgery. Allergies  Allergen Reactions  . Bee Venom Shortness Of Breath and Swelling  . Codeine Phosphate Hives, Shortness Of Breath, Itching, Rash and Other (See Comments)    Tolerated multiple doses of IV morphine  . Metoprolol Tartrate Other (See Comments)    loss of vision. Can take the XL tablet not regular   . Tramadol     Pt has hallucinations   . Ramipril Rash and Cough     Current Outpatient Medications  Medication Sig Dispense Refill  . ACCU-CHEK FASTCLIX LANCETS MISC USE TO CHECK BLOOD SUGAR TWICE A DAY AND AS NEEDED 306 each 3  . acetaminophen (TYLENOL ARTHRITIS PAIN) 650 MG CR tablet Take 1,300 mg by mouth 2 (two) times daily.    Marland Kitchen albuterol (PROVENTIL HFA;VENTOLIN HFA) 108 (90 Base) MCG/ACT inhaler INHALE 2 PUFFS INTO THE LUNGS EVERY 6 (SIX) HOURS AS NEEDED FOR WHEEZING OR SHORTNESS OF BREATH. 54 g 3  . Ascorbic Acid (VITAMIN C) 500 MG tablet Take 1,000 mg by mouth daily.     Marland Kitchen aspirin 81 MG tablet Take 81 mg by mouth every evening.     Marland Kitchen atorvastatin (LIPITOR) 40 MG tablet TAKE 1 TABLET EVERY DAY AT 6 PM (Patient taking differently: Take 40 mg by mouth every evening. ) 90 tablet 2  . calcium carbonate (OS-CAL) 600 MG TABS Take 600 mg by mouth 2 (two) times daily with a meal.    . cetirizine (ZYRTEC) 10 MG tablet Take 10 mg by mouth daily.     . furosemide (LASIX) 40 MG tablet TAKE 1 TABLET TWICE DAILY 180 tablet 0  . Glucosamine-Chondroitin 250-200 MG CAPS Take 2 capsules by mouth daily.     Marland Kitchen glucose blood (ACCU-CHEK SMARTVIEW) test strip USE TO  CHECK BLOOD SUGAR TWICE A DAY AND PRN 300 each 3  . guaiFENesin-dextromethorphan (ROBITUSSIN DM) 100-10 MG/5ML syrup Take 5 mLs by mouth every 4 (four) hours as needed for cough.    . losartan (COZAAR) 100 MG tablet TAKE 1 TABLET EVERY DAY 90 tablet 0  . metFORMIN (GLUCOPHAGE) 1000 MG tablet TAKE 1 TABLET TWICE DAILY WITH MEALS 180 tablet 3  . metoprolol succinate (TOPROL-XL) 100 MG 24 hr tablet Take 1 tablet (100 mg total) by mouth daily. 90 tablet 0  . Misc Natural Products (TART CHERRY ADVANCED) CAPS Take 2 capsules by mouth daily.     . Multiple Vitamin (MULTIVITAMIN) tablet Take 1 tablet by mouth daily.      . nitroGLYCERIN (NITROSTAT) 0.4 MG SL tablet Place 1 tablet (0.4 mg total) under the tongue every 5 (five) minutes as needed. For chest pain 25 tablet 3  . Omega-3 Fatty Acids (FISH OIL) 1200 MG CAPS Take 1,200 mg by mouth daily.     . potassium chloride (K-DUR,KLOR-CON) 10 MEQ tablet TAKE 1 TABLET TWICE DAILY 180 tablet 0  . Probiotic Product (PHILLIPS COLON HEALTH PO) Take 1 capsule by mouth daily.    . vitamin B-12 (CYANOCOBALAMIN) 1000 MCG tablet Take 1,000 mcg by mouth daily.    Marland Kitchen warfarin (COUMADIN) 10 MG tablet TAKE AS DIRECTED  BY COUMADIN CLINIC 90 tablet 0   No current facility-administered medications for this visit.      Past Medical History:  Diagnosis Date  . Acute renal failure ([place]) 02/09/2013  . Aortic stenosis 08/25/2010   a. Bentall aortic root replacement with a St. Jude mechanical valve and Hemashield conduit 02/2004.  Marland Kitchen Arthritis   . Asthma   . AV BLOCK, COMPLETE    a. s/p St Jude dual chamber pacemaker 02/2004.  Marland Kitchen CAD (coronary artery disease)    a. s/p CABGx2 (LIMA-dLAD, SVG-Cx). b. Low risk nuc 12/2012 without ischemia, EF 46% mild apical hypokinesia (EF 55% inf HK by echo).  . Carotid artery disease ([place])    a. 0-39% bilateral ICA stenosis, stable mild hard plaque in carotid bulbs. F/u 03/2014 recommended.  . Diabetes mellitus without complication ([place])     type 2   . DIVERTICULOSIS, COLON 10/17/2007  . Ejection fraction    a. EF 55% with inf HK, mild MR by echo 12/2012.  Marland Kitchen GERD (gastroesophageal reflux disease)    hxof   . GOUT 01/16/2007  . Headache    hx of migraines   . HEMORRHOIDS, INTERNAL 11/01/2008  . HYPERLIPIDEMIA 01/16/2007  . HYPERTENSION 01/16/2007  . Impaired glucose tolerance   . LEG EDEMA 11/27/2008  . Special screening for malignant neoplasm of prostate   . Warfarin anticoagulation    AVR    ROS:   All systems reviewed and negative except as noted in the HPI.   Past Surgical History:  Procedure Laterality Date  . AORTIC VALVE REPLACEMENT    . CARDIAC CATHETERIZATION  06/2003  . CIRCUMCISION    . COLONOSCOPY    . CORONARY ARTERY BYPASS GRAFT    . coronary stents      prior to bypass   . EP IMPLANTABLE DEVICE N/A 05/06/2016   Procedure: PPM Generator Changeout;  Surgeon: Evans Lance, MD;  Location: [facility];  Service: Cardiovascular;  Laterality: N/A;  . ESOPHAGOGASTRODUODENOSCOPY    . HERNIA REPAIR    . INGUINAL HERNIA REPAIR Bilateral 02/07/2013   Procedure: LAPAROSCOPIC BILATERAL INGUINAL HERNIA REPAIR;  Surgeon: Imogene Burn. Georgette Dover, MD;  [facility];  Service: General;  Laterality: Bilateral;  . INSERTION OF MESH Bilateral 02/07/2013   Procedure: INSERTION OF MESH;  Surgeon: Imogene Burn. Georgette Dover, MD;  [facility];  Service: General;  Laterality: Bilateral;  . LUMBAR LAMINECTOMY/DECOMPRESSION MICRODISCECTOMY N/A 03/15/2018   Procedure: Complete decompressive lumbar laminectomy for spinal stenosis of L4-L5 and foraminotomy for L4-L5 root on right;  Surgeon: [name], MD;  [facility];  Service: Orthopedics;  Laterality: N/A;  . PACEMAKER INSERTION    . SHOULDER SURGERY Bilateral      Family History  Problem Relation Age of Onset  . Heart attack Father   . Kidney cancer Father   . Hypertension Mother   . Diabetes Mother   . Diabetes Brother   . Epilepsy Sister   . Diabetes Sister   .  Diabetes Brother      Social History   Socioeconomic History  . Marital status: Married    Spouse name: Not on file  . Number of children: 3  . Years of education: Not on file  . Highest education level: Not on file  Occupational History  . Occupation: retired  Scientific laboratory technician  . Financial resource strain: Not on file  . Food insecurity:    Worry: Not on file    Inability: Not on file  .  Transportation needs:    Medical: Not on file    Non-medical: Not on file  Tobacco Use  . Smoking status: Former Smoker    Last attempt to quit: 07/12/1973    Years since quitting: 44.9  . Smokeless tobacco: Never Used  Substance and Sexual Activity  . Alcohol use: No  . Drug use: No  . Sexual activity: Never  Lifestyle  . Physical activity:    Days per week: Not on file    Minutes per session: Not on file  . Stress: Not on file  Relationships  . Social connections:    Talks on phone: Not on file    Gets together: Not on file    Attends religious service: Not on file    Active member of club or organization: Not on file    Attends meetings of clubs or organizations: Not on file    Relationship status: Not on file  . Intimate partner violence:    Fear of current or ex partner: Not on file    Emotionally abused: Not on file    Physically abused: Not on file    Forced sexual activity: Not on file  Other Topics Concern  . Not on file  Social History Narrative  . Not on file     BP 132/82   Pulse 80   Ht 5' 6.5" (1.689 m)   Wt 228 lb (103.4 kg)   SpO2 95%   BMI 36.25 kg/m   Physical Exam:  Well appearing NAD HEENT: Unremarkable Neck:  No JVD, no thyromegally Lymphatics:  No adenopathy Back:  No CVA tenderness Lungs:  Clear HEART:  Regular rate rhythm, no murmurs, no rubs, no clicks, mechanical S2. Abd:  soft, positive bowel sounds, no organomegally, no rebound, no guarding Ext:  2 plus pulses, no edema, no cyanosis, no clubbing Skin:  No rashes no nodules Neuro:  CN  II through XII intact, motor grossly intact  DEVICE  Normal device function.  See PaceArt for details.   Assess/Plan: 1. CHB - he is asymptomatic, s/p PPM 2. PPM - his St. Jude DDD PM is working normally. We will recheck in several months. 3. Diastolic heart failure - he is limited by his back and his leg. 4. AVR - on exam today his valve sounds are crisp and I do not appreciate any murmur.  Mikle Bosworth.D.

## 2018-06-09 ENCOUNTER — Other Ambulatory Visit: Payer: Self-pay | Admitting: Internal Medicine

## 2018-06-10 LAB — CUP PACEART INCLINIC DEVICE CHECK
Battery Voltage: 2.99 V
Brady Statistic RV Percent Paced: 99.87 %
Date Time Interrogation Session: 20191127170914
Implantable Lead Implant Date: 20050816
Implantable Lead Location: 753859
Implantable Lead Location: 753860
Implantable Pulse Generator Implant Date: 20171026
Lead Channel Pacing Threshold Amplitude: 1.125 V
Lead Channel Pacing Threshold Pulse Width: 0.5 ms
Lead Channel Sensing Intrinsic Amplitude: 3 mV
Lead Channel Setting Pacing Amplitude: 0.75 V
MDC IDC LEAD IMPLANT DT: 20050816
MDC IDC MSMT BATTERY REMAINING LONGEVITY: 132 mo
MDC IDC MSMT LEADCHNL RA IMPEDANCE VALUE: 475 Ohm
MDC IDC MSMT LEADCHNL RV IMPEDANCE VALUE: 725 Ohm
MDC IDC MSMT LEADCHNL RV PACING THRESHOLD AMPLITUDE: 0.5 V
MDC IDC MSMT LEADCHNL RV PACING THRESHOLD PULSEWIDTH: 0.5 ms
MDC IDC SET LEADCHNL RA PACING AMPLITUDE: 2.125
MDC IDC SET LEADCHNL RV PACING PULSEWIDTH: 0.5 ms
MDC IDC SET LEADCHNL RV SENSING SENSITIVITY: 4 mV
MDC IDC STAT BRADY RA PERCENT PACED: 24 %
Pulse Gen Model: 2272
Pulse Gen Serial Number: 3182792

## 2018-06-22 DIAGNOSIS — M1712 Unilateral primary osteoarthritis, left knee: Secondary | ICD-10-CM | POA: Diagnosis not present

## 2018-06-22 DIAGNOSIS — M1711 Unilateral primary osteoarthritis, right knee: Secondary | ICD-10-CM | POA: Diagnosis not present

## 2018-06-27 ENCOUNTER — Ambulatory Visit (INDEPENDENT_AMBULATORY_CARE_PROVIDER_SITE_OTHER): Payer: Medicare HMO

## 2018-06-27 DIAGNOSIS — Z952 Presence of prosthetic heart valve: Secondary | ICD-10-CM | POA: Diagnosis not present

## 2018-06-27 DIAGNOSIS — Z5181 Encounter for therapeutic drug level monitoring: Secondary | ICD-10-CM

## 2018-06-27 DIAGNOSIS — Z09 Encounter for follow-up examination after completed treatment for conditions other than malignant neoplasm: Secondary | ICD-10-CM

## 2018-06-27 LAB — POCT INR: INR: 2.3 (ref 2.0–3.0)

## 2018-06-27 NOTE — Patient Instructions (Signed)
Description   Continue dosage of 1 tablet every day. Recheck in 6 weeks. Call with any changes., # [phone].

## 2018-07-11 LAB — CUP PACEART REMOTE DEVICE CHECK
Brady Statistic AP VS Percent: 1 %
Brady Statistic AS VP Percent: 75 %
Brady Statistic RA Percent Paced: 25 %
Brady Statistic RV Percent Paced: 99 %
Date Time Interrogation Session: 20191030191856
Implantable Lead Implant Date: 20050816
Implantable Lead Location: 753860
Implantable Pulse Generator Implant Date: 20171026
Lead Channel Pacing Threshold Amplitude: 0.5 V
Lead Channel Sensing Intrinsic Amplitude: 12 mV
Lead Channel Sensing Intrinsic Amplitude: 3.1 mV
Lead Channel Setting Pacing Amplitude: 2 V
Lead Channel Setting Pacing Pulse Width: 0.5 ms
Lead Channel Setting Sensing Sensitivity: 4 mV
MDC IDC LEAD IMPLANT DT: 20050816
MDC IDC LEAD LOCATION: 753859
MDC IDC MSMT BATTERY REMAINING LONGEVITY: 130 mo
MDC IDC MSMT BATTERY REMAINING PERCENTAGE: 95.5 %
MDC IDC MSMT BATTERY VOLTAGE: 2.99 V
MDC IDC MSMT LEADCHNL RA IMPEDANCE VALUE: 490 Ohm
MDC IDC MSMT LEADCHNL RA PACING THRESHOLD AMPLITUDE: 1 V
MDC IDC MSMT LEADCHNL RA PACING THRESHOLD PULSEWIDTH: 0.5 ms
MDC IDC MSMT LEADCHNL RV IMPEDANCE VALUE: 740 Ohm
MDC IDC MSMT LEADCHNL RV PACING THRESHOLD PULSEWIDTH: 0.5 ms
MDC IDC SET LEADCHNL RV PACING AMPLITUDE: 0.75 V
MDC IDC STAT BRADY AP VP PERCENT: 25 %
MDC IDC STAT BRADY AS VS PERCENT: 1 %
Pulse Gen Model: 2272
Pulse Gen Serial Number: 3182792

## 2018-07-21 ENCOUNTER — Encounter: Payer: Self-pay | Admitting: Family Medicine

## 2018-07-21 DIAGNOSIS — M170 Bilateral primary osteoarthritis of knee: Secondary | ICD-10-CM | POA: Insufficient documentation

## 2018-07-30 ENCOUNTER — Other Ambulatory Visit: Payer: Self-pay | Admitting: Internal Medicine

## 2018-08-02 ENCOUNTER — Other Ambulatory Visit: Payer: Self-pay | Admitting: Adult Health

## 2018-08-02 MED ORDER — FUROSEMIDE 40 MG PO TABS: 40.0000 mg | ORAL_TABLET | Freq: Two times a day (BID) | ORAL | 0 refills | Status: DC

## 2018-08-02 NOTE — Telephone Encounter (Signed)
Requested Prescriptions  Pending Prescriptions Disp Refills  . furosemide (LASIX) 40 MG tablet 180 tablet 0    Sig: Take 1 tablet (40 mg total) by mouth 2 (two) times daily.     Cardiovascular:  Diuretics - Loop Passed - 08/02/2018  1:05 PM      Passed - K in normal range and within 360 days    Potassium  Date Value Ref Range Status  03/09/2018 4.6 3.5 - 5.1 mmol/L Final         Passed - Ca in normal range and within 360 days    Calcium  Date Value Ref Range Status  03/09/2018 9.1 8.9 - 10.3 mg/dL Final         Passed - Na in normal range and within 360 days    Sodium  Date Value Ref Range Status  03/09/2018 141 135 - 145 mmol/L Final         Passed - Cr in normal range and within 360 days    Creat  Date Value Ref Range Status  04/29/2016 0.87 0.70 - 1.18 mg/dL Final    Comment:      For patients > or = 75 years of age: The upper reference limit for Creatinine is approximately 13% higher for people identified as African-American.      Creatinine, Ser  Date Value Ref Range Status  03/09/2018 0.79 0.61 - 1.24 mg/dL Final         Passed - Last BP in normal range    BP Readings from Last 1 Encounters:  06/07/18 132/82         Passed - Valid encounter within last 6 months    Recent Outpatient Visits          4 months ago Diabetes mellitus without complication ([place])   [place] [facility], Doretha Sou, MD   9 months ago Diabetes mellitus without complication ([place])   [place] at [facility], Doretha Sou, MD   1 year ago Need for influenza vaccination   [facility], Doretha Sou, MD   1 year ago Encounter for preventive health examination   [facility] at [facility], Doretha Sou, MD   2 years ago Respiratory infection   [facility], Nickola Major, DO      Future Appointments            In 1 month Nafziger, Tommi Rumps, NP [facility] at [place], [facility]

## 2018-08-02 NOTE — Telephone Encounter (Signed)
Copied from [place] [id number]. Topic: Quick Communication - Rx Refill/Question >> Aug 02, 2018 12:58 PM Judyann Munson wrote: Medication: furosemide (LASIX) 40 MG tablet  Has the patient contacted their pharmacy? Yes  Preferred Pharmacy (with phone number or street name): [place], [place] [phone] (Phone) [phone] (Fax)    Agent: Please be advised that RX refills may take up to 3 business days. We ask that you follow-up with your pharmacy.

## 2018-08-08 ENCOUNTER — Ambulatory Visit (INDEPENDENT_AMBULATORY_CARE_PROVIDER_SITE_OTHER): Payer: Medicare HMO | Admitting: *Deleted

## 2018-08-08 DIAGNOSIS — Z952 Presence of prosthetic heart valve: Secondary | ICD-10-CM | POA: Diagnosis not present

## 2018-08-08 DIAGNOSIS — Z5181 Encounter for therapeutic drug level monitoring: Secondary | ICD-10-CM

## 2018-08-08 DIAGNOSIS — Z09 Encounter for follow-up examination after completed treatment for conditions other than malignant neoplasm: Secondary | ICD-10-CM

## 2018-08-08 LAB — POCT INR: INR: 2.6 (ref 2.0–3.0)

## 2018-08-08 NOTE — Patient Instructions (Signed)
Description   Continue dosage of 1 tablet every day. Recheck in 6 weeks. Call with any changes., # [phone].

## 2018-08-09 ENCOUNTER — Ambulatory Visit (INDEPENDENT_AMBULATORY_CARE_PROVIDER_SITE_OTHER): Payer: Medicare HMO

## 2018-08-09 DIAGNOSIS — I442 Atrioventricular block, complete: Secondary | ICD-10-CM

## 2018-08-10 ENCOUNTER — Telehealth: Payer: Self-pay

## 2018-08-10 NOTE — Progress Notes (Signed)
Remote pacemaker transmission.   

## 2018-08-11 LAB — CUP PACEART REMOTE DEVICE CHECK
Battery Remaining Percentage: 95.5 %
Battery Voltage: 2.99 V
Brady Statistic AS VP Percent: 74 %
Brady Statistic RA Percent Paced: 25 %
Date Time Interrogation Session: 20200129070013
Implantable Lead Implant Date: 20050816
Implantable Lead Implant Date: 20050816
Implantable Lead Location: 753859
Lead Channel Impedance Value: 730 Ohm
Lead Channel Pacing Threshold Amplitude: 1.125 V
Lead Channel Pacing Threshold Pulse Width: 0.5 ms
Lead Channel Pacing Threshold Pulse Width: 0.5 ms
Lead Channel Sensing Intrinsic Amplitude: 2.7 mV
Lead Channel Setting Pacing Amplitude: 0.75 V
Lead Channel Setting Sensing Sensitivity: 4 mV
MDC IDC LEAD LOCATION: 753860
MDC IDC MSMT BATTERY REMAINING LONGEVITY: 128 mo
MDC IDC MSMT LEADCHNL RA IMPEDANCE VALUE: 480 Ohm
MDC IDC MSMT LEADCHNL RV PACING THRESHOLD AMPLITUDE: 0.5 V
MDC IDC MSMT LEADCHNL RV SENSING INTR AMPL: 12 mV
MDC IDC PG IMPLANT DT: 20171026
MDC IDC PG SERIAL: 3182792
MDC IDC SET LEADCHNL RA PACING AMPLITUDE: 2.125
MDC IDC SET LEADCHNL RV PACING PULSEWIDTH: 0.5 ms
MDC IDC STAT BRADY AP VP PERCENT: 26 %
MDC IDC STAT BRADY AP VS PERCENT: 1 %
MDC IDC STAT BRADY AS VS PERCENT: 1 %
MDC IDC STAT BRADY RV PERCENT PACED: 99 %

## 2018-08-14 ENCOUNTER — Other Ambulatory Visit: Payer: Self-pay | Admitting: Cardiology

## 2018-08-14 ENCOUNTER — Other Ambulatory Visit: Payer: Self-pay | Admitting: Family Medicine

## 2018-08-15 NOTE — Telephone Encounter (Signed)
Pt has an upcoming appt to establish care.  Please advise.

## 2018-08-17 ENCOUNTER — Other Ambulatory Visit: Payer: Self-pay | Admitting: Adult Health

## 2018-08-17 MED ORDER — METOPROLOL SUCCINATE ER 100 MG PO TB24: 100.0000 mg | ORAL_TABLET | Freq: Every day | ORAL | 0 refills | Status: DC

## 2018-08-17 NOTE — Telephone Encounter (Signed)
Copied from [place] [id number]. Topic: Quick Communication - Rx Refill/Question >> Aug 17, 2018 11:46 AM Ahmed Prima L wrote: Medication: metoprolol succinate (TOPROL-XL) 100 MG 24 hr tablet  Has the patient contacted their pharmacy? Yes , patient has upcoming TOC from Dr Raliegh Ip to [name]  (Agent: If no, request that the patient contact the pharmacy for the refill.) (Agent: If yes, when and what did the pharmacy advise?)  Preferred Pharmacy (with phone number or street name): [facility], [place] [address]    Agent: Please be advised that RX refills may take up to 3 business days. We ask that you follow-up with your pharmacy.

## 2018-08-28 ENCOUNTER — Encounter: Payer: Self-pay | Admitting: Cardiology

## 2018-08-28 ENCOUNTER — Ambulatory Visit: Payer: Medicare HMO | Admitting: Cardiology

## 2018-08-28 VITALS — BP 130/72 | HR 76 | Ht 67.75 in | Wt 219.8 lb

## 2018-08-28 DIAGNOSIS — Z7901 Long term (current) use of anticoagulants: Secondary | ICD-10-CM

## 2018-08-28 DIAGNOSIS — I10 Essential (primary) hypertension: Secondary | ICD-10-CM | POA: Diagnosis not present

## 2018-08-28 DIAGNOSIS — E785 Hyperlipidemia, unspecified: Secondary | ICD-10-CM | POA: Diagnosis not present

## 2018-08-28 DIAGNOSIS — Z952 Presence of prosthetic heart valve: Secondary | ICD-10-CM | POA: Diagnosis not present

## 2018-08-28 DIAGNOSIS — I739 Peripheral vascular disease, unspecified: Secondary | ICD-10-CM | POA: Diagnosis not present

## 2018-08-28 DIAGNOSIS — I442 Atrioventricular block, complete: Secondary | ICD-10-CM | POA: Diagnosis not present

## 2018-08-28 DIAGNOSIS — I779 Disorder of arteries and arterioles, unspecified: Secondary | ICD-10-CM

## 2018-08-28 MED ORDER — NITROGLYCERIN 0.4 MG SL SUBL: 0.4000 mg | SUBLINGUAL_TABLET | SUBLINGUAL | 3 refills | Status: DC | PRN

## 2018-08-28 NOTE — Patient Instructions (Signed)
Medication Instructions:  Your physician recommends that you continue on your current medications as directed. Please refer to the Current Medication list given to you today.   Labwork: -None  Testing/Procedures: -None  Follow-Up: You have two recalls in system. Office will call you with appointment when time gets closer.  Any Other Special Instructions Will Be Listed Below (If Applicable).     If you need a refill on your cardiac medications before your next appointment, please call your pharmacy.

## 2018-08-28 NOTE — Progress Notes (Signed)
Cardiology Office Note   Date:  08/28/2018   ID:  [name], DOB [birth year], MRN [id number]  PCP:  [name], NP  Cardiologist: Dr. Elta Guadeloupe Skains/Dr. Cristopher Peru  Chief Complaint  Patient presents with  . Follow-up  . Coronary Artery Disease  . Aortic Stenosis  . Anticoagulation     History of Present Illness: [name] is a 73 y.o. male who presents for follow up for CAD, aortic valve disease and PPM, seen for Dr. Marlou Whitney.    [name] has a past history of CAD s/p CABGx2 , AV block s/p PPM, aortic valve disease s/p Bentall (2012) root replacement, HTN and HLD.   He was last seen by Dr. Marlou Whitney 02/28/2018. At that time he was stable from a cardiac standpoint. He was suffering from chronic knee and back pain and was seen by ortho for possible repair. He was seen by Dr. Marlou Whitney for pre-operative assessment. He was noted to have had a stress test which was low risk without ischemia. Last echocardiogram showed normal functioning mechanical valve. He was previously bridged with Lovenox for a previous procedure and had some post operative bleeding complications. An S2 click was noted on exam.   Today he is doing good from a cardiac perspective. He denies anginal symptoms. His back procedure went well without complication. He has chronic knee pain for which he receives injections. He has intermittent Whitney swelling without orthopnea symptoms. He denies bleeding in his stool or urine. His PCP has retired and he will be starting with a new PA in the next several weeks. He follows at our Coumadin clinic and with Dr. Lovena Whitney for his PPM.   Past Medical History:  Diagnosis Date  . Acute renal failure ([place]) 02/09/2013  . Aortic stenosis 08/25/2010   a. Bentall aortic root replacement with a St. Jude mechanical valve and Hemashield conduit 02/2004.  Marland Kitchen Arthritis   . Asthma   . AV BLOCK, COMPLETE    a. s/p St Jude dual chamber pacemaker 02/2004.  Marland Kitchen CAD (coronary artery disease)    a.  s/p CABGx2 (LIMA-dLAD, SVG-Cx). b. Low risk nuc 12/2012 without ischemia, EF 46% mild apical hypokinesia (EF 55% inf HK by echo).  . Carotid artery disease ([place])    a. 0-39% bilateral ICA stenosis, stable mild hard plaque in carotid bulbs. F/u 03/2014 recommended.  . Diabetes mellitus without complication ([place])    type 2   . DIVERTICULOSIS, COLON 10/17/2007  . Ejection fraction    a. EF 55% with inf HK, mild MR by echo 12/2012.  Marland Kitchen GERD (gastroesophageal reflux disease)    hxof   . GOUT 01/16/2007  . Headache    hx of migraines   . HEMORRHOIDS, INTERNAL 11/01/2008  . HYPERLIPIDEMIA 01/16/2007  . HYPERTENSION 01/16/2007  . Impaired glucose tolerance   . LEG EDEMA 11/27/2008  . Special screening for malignant neoplasm of prostate   . Warfarin anticoagulation    AVR    Past Surgical History:  Procedure Laterality Date  . AORTIC VALVE REPLACEMENT    . CARDIAC CATHETERIZATION  06/2003  . CIRCUMCISION    . COLONOSCOPY    . CORONARY ARTERY BYPASS GRAFT    . coronary stents      prior to bypass   . EP IMPLANTABLE DEVICE N/A 05/06/2016   Procedure: PPM Generator Changeout;  Surgeon: Evans Lance, MD;  Location: [facility];  Service: Cardiovascular;  Laterality: N/A;  . ESOPHAGOGASTRODUODENOSCOPY    .  HERNIA REPAIR    . INGUINAL HERNIA REPAIR Bilateral 02/07/2013   Procedure: LAPAROSCOPIC BILATERAL INGUINAL HERNIA REPAIR;  Surgeon: Imogene Burn. Georgette Dover, MD;  Location: [facility];  Service: General;  Laterality: Bilateral;  . INSERTION OF MESH Bilateral 02/07/2013   Procedure: INSERTION OF MESH;  Surgeon: Imogene Burn. Georgette Dover, MD;  Location: [facility];  Service: General;  Laterality: Bilateral;  . LUMBAR LAMINECTOMY/DECOMPRESSION MICRODISCECTOMY N/A 03/15/2018   Procedure: Complete decompressive lumbar laminectomy for spinal stenosis of L4-L5 and foraminotomy for L4-L5 root on right;  Surgeon: [name], MD;  Location: [facility];  Service: Orthopedics;  Laterality: N/A;  . PACEMAKER INSERTION    .  SHOULDER SURGERY Bilateral      Current Outpatient Medications  Medication Sig Dispense Refill  . ACCU-CHEK FASTCLIX LANCETS MISC USE TO CHECK BLOOD SUGAR TWICE A DAY AND AS NEEDED 306 each 3  . acetaminophen (TYLENOL ARTHRITIS PAIN) 650 MG CR tablet Take 1,300 mg by mouth 2 (two) times daily.    Marland Kitchen albuterol (PROVENTIL HFA;VENTOLIN HFA) 108 (90 Base) MCG/ACT inhaler INHALE 2 PUFFS INTO THE LUNGS EVERY 6 (SIX) HOURS AS NEEDED FOR WHEEZING OR SHORTNESS OF BREATH. 54 g 3  . Ascorbic Acid (VITAMIN C) 500 MG tablet Take 1,000 mg by mouth daily.     Marland Kitchen aspirin 81 MG tablet Take 81 mg by mouth every evening.     Marland Kitchen atorvastatin (LIPITOR) 40 MG tablet TAKE 1 TABLET EVERY DAY AT 6 PM 90 tablet 3  . calcium carbonate (OS-CAL) 600 MG TABS Take 600 mg by mouth 2 (two) times daily with a meal.    . cetirizine (ZYRTEC) 10 MG tablet Take 10 mg by mouth daily.     . furosemide (LASIX) 40 MG tablet Take 1 tablet (40 mg total) by mouth 2 (two) times daily. 180 tablet 0  . Glucosamine-Chondroitin 250-200 MG CAPS Take 2 capsules by mouth daily.     Marland Kitchen glucose blood (ACCU-CHEK SMARTVIEW) test strip USE TO CHECK BLOOD SUGAR TWICE A DAY AND PRN 300 each 3  . guaiFENesin-dextromethorphan (ROBITUSSIN DM) 100-10 MG/5ML syrup Take 5 mLs by mouth every 4 (four) hours as needed for cough.    . losartan (COZAAR) 100 MG tablet Take 1 tablet (100 mg total) by mouth daily. 90 tablet 2  . metFORMIN (GLUCOPHAGE) 1000 MG tablet TAKE 1 TABLET TWICE DAILY WITH MEALS 180 tablet 3  . metoprolol succinate (TOPROL-XL) 100 MG 24 hr tablet Take 1 tablet (100 mg total) by mouth daily. 90 tablet 0  . Misc Natural Products (TART CHERRY ADVANCED) CAPS Take 2 capsules by mouth daily.     . Multiple Vitamin (MULTIVITAMIN) tablet Take 1 tablet by mouth daily.      . nitroGLYCERIN (NITROSTAT) 0.4 MG SL tablet Place 1 tablet (0.4 mg total) under the tongue every 5 (five) minutes as needed. For chest pain 25 tablet 3  . Omega-3 Fatty Acids (FISH  OIL) 1200 MG CAPS Take 1,200 mg by mouth daily.     . potassium chloride (K-DUR,KLOR-CON) 10 MEQ tablet TAKE 1 TABLET TWICE DAILY 180 tablet 0  . Probiotic Product (PHILLIPS COLON HEALTH PO) Take 1 capsule by mouth daily.    . vitamin B-12 (CYANOCOBALAMIN) 1000 MCG tablet Take 1,000 mcg by mouth daily.    Marland Kitchen warfarin (COUMADIN) 10 MG tablet TAKE 1 TABLET DAILY OR AS DIRECTED BY COUMADIN CLINIC 90 tablet 1   No current facility-administered medications for this visit.     Allergies:   Bee  venom; Codeine phosphate; Metoprolol tartrate; Tramadol; and Ramipril    Social History:  The patient  reports that he quit smoking about 45 years ago. He has never used smokeless tobacco. He reports that he does not drink alcohol or use drugs.   Family History:  The patient's family history includes Diabetes in his brother, brother, mother, and sister; Epilepsy in his sister; Heart attack in his father; Hypertension in his mother; Kidney cancer in his father.    ROS:  Please see the history of present illness. Otherwise, review of systems are positive for none. All other systems are reviewed and negative.    PHYSICAL EXAM: VS:  BP 130/72 (BP Location: Left Arm, Patient Position: Sitting, Cuff Size: Normal)   Pulse 76   Ht 5' 7.75" (1.721 m)   Wt 219 lb 12.8 oz (99.7 kg)   SpO2 94% Comment: at rest  BMI 33.67 kg/m  , BMI Body mass index is 33.67 kg/m.   General: Well developed, well nourished, NAD Skin: Warm, dry, intact  Head: Normocephalic, atraumatic, clear, moist mucus membranes. Neck: Negative for carotid bruits. No JVD Lungs:Clear to ausculation bilaterally. No wheezes, rales, or rhonchi. Breathing is unlabored. Cardiovascular: RRR with S1 S2. + mechanical click.  MSK: Strength and tone appear normal for age. 5/5 in all extremities Extremities: 1+ BLE edema. No clubbing or cyanosis. DP/PT pulses 1+ bilaterally Neuro: Alert and oriented. No focal deficits. No facial asymmetry. MAE  spontaneously. Psych: Responds to questions appropriately with normal affect.    EKG:  EKG is not ordered today.  Recent Labs: 10/11/2017: TSH 2.16 03/09/2018: ALT 24; BUN 13; Creatinine, Ser 0.79; Hemoglobin 14.4; Platelets 198; Potassium 4.6; Sodium 141   Lipid Panel    Component Value Date/Time   CHOL 120 10/11/2017 0905   TRIG 109.0 10/11/2017 0905   TRIG 91 06/14/2006 0825   HDL 43.30 10/11/2017 0905   CHOLHDL 3 10/11/2017 0905   VLDL 21.8 10/11/2017 0905   LDLCALC 55 10/11/2017 0905    Wt Readings from Last 3 Encounters:  08/28/18 219 lb 12.8 oz (99.7 kg)  06/07/18 228 lb (103.4 kg)  03/30/18 221 lb 6.4 oz (100.4 kg)    Other studies Reviewed: Additional studies/ records that were reviewed today include:   04/2014-vascular study, carotids Heterogeneous plaque, bilaterally. Stable, 1-39% bilateral ICA stenosis. Patent vertebral arteries with antegrade flow. Normal subclavian arteries, bilaterally. f/u 2 years.  Other studies Reviewed: ECHO 02/06/16: - Left ventricle: LVEF is approximately 55% with hypokinesis of the distal inferior/inferoseptal walls. The cavity size was normal. Wall thickness was normal. Doppler parameters are consistent with abnormal left ventricular relaxation (grade 1 diastolic dysfunction). - Aortic valve: AV prosthesis appears to move well Peak and mean gradients through the valve are 16 and 10 mm Hg respectively. There was trivial regurgitation. Valve area (VTI): 2.77 cm^2. Valve area (Vmax): 2.63 cm^2. Valve area (Vmean): 2.55 cm^2. - Right ventricle: The cavity size was mildly dilated. Wall thickness was normal. - Right atrium: The atrium was mildly dilated.   ASSESSMENT AND PLAN:  1. Aortic stenosis with priorortic valve replacement, mechanical: -History of Bentall procedure with mechanical aortic valve replacement in 2005. -Last echocardiogram in 2017 with normal function and no concerns  -No changes  made -Follows Coumadin clinic with stable INR Q6 weeks   -Dental prophylaxis reviewed   3. Coronary artery disease s/p CABG: -Bypass surgery at the time of aortic valve replacement 2005  -Nuclear stress test from 2013 with no ischemia -Continue secondary  prevention -Continue ASA, BB, statin  4. Hyperlipidemia: -Previously changed from Pravachol to atorvastatin, guideline directed therapy -Last LDL 10/11/2017, 55  -Labs per PCP   5. Carotid artery disease: -Stable, mild plaque bilaterally -Continue with secondary prevention  6. Pacemaker: -Change out 05/06/16, follows with Dr. Lovena Whitney -PPM dependent   7. Chronic Coumadin: -Continue Coumadin for his aortic valve prosthesis -Denies s/s acute bleeding  -Last INR, 2.6 08/08/2018  8. Mild Whitney edema: -Continue PO Lasix 40mg  daily -Encouraged compression stockings and Whitney elevation -Denies SOB or orthopnea symptoms    Current medicines are reviewed at length with the patient today.  The patient does not have concerns regarding medicines.   The following changes have been made:  no change  Labs/ tests ordered today include: None  No orders of the defined types were placed in this encounter.  Disposition:   FU with Dr. Marlou Whitney in 6 months  Signed, Kathyrn Drown, NP  08/28/2018 3:39 PM    [address], [place], [place]  [address] Phone: [phone]; Fax: [phone]

## 2018-09-19 ENCOUNTER — Ambulatory Visit (INDEPENDENT_AMBULATORY_CARE_PROVIDER_SITE_OTHER): Payer: Medicare HMO | Admitting: *Deleted

## 2018-09-19 DIAGNOSIS — Z09 Encounter for follow-up examination after completed treatment for conditions other than malignant neoplasm: Secondary | ICD-10-CM

## 2018-09-19 DIAGNOSIS — Z952 Presence of prosthetic heart valve: Secondary | ICD-10-CM

## 2018-09-19 DIAGNOSIS — Z5181 Encounter for therapeutic drug level monitoring: Secondary | ICD-10-CM

## 2018-09-19 LAB — POCT INR: INR: 2.6 (ref 2.0–3.0)

## 2018-09-19 NOTE — Patient Instructions (Signed)
Description   Continue dosage of 1 tablet every day. Recheck in 6 weeks. Call with any changes., # [phone].

## 2018-09-21 DIAGNOSIS — M1712 Unilateral primary osteoarthritis, left knee: Secondary | ICD-10-CM | POA: Diagnosis not present

## 2018-09-21 DIAGNOSIS — M13861 Other specified arthritis, right knee: Secondary | ICD-10-CM | POA: Diagnosis not present

## 2018-09-28 ENCOUNTER — Other Ambulatory Visit: Payer: Self-pay

## 2018-09-28 ENCOUNTER — Ambulatory Visit (INDEPENDENT_AMBULATORY_CARE_PROVIDER_SITE_OTHER): Payer: Medicare HMO | Admitting: Adult Health

## 2018-09-28 ENCOUNTER — Encounter: Payer: Self-pay | Admitting: Adult Health

## 2018-09-28 VITALS — BP 144/82 | Temp 97.6°F | Ht 66.75 in | Wt 220.0 lb

## 2018-09-28 DIAGNOSIS — E119 Type 2 diabetes mellitus without complications: Secondary | ICD-10-CM | POA: Diagnosis not present

## 2018-09-28 DIAGNOSIS — I10 Essential (primary) hypertension: Secondary | ICD-10-CM | POA: Diagnosis not present

## 2018-09-28 DIAGNOSIS — Z952 Presence of prosthetic heart valve: Secondary | ICD-10-CM

## 2018-09-28 DIAGNOSIS — Z7901 Long term (current) use of anticoagulants: Secondary | ICD-10-CM | POA: Diagnosis not present

## 2018-09-28 DIAGNOSIS — Z950 Presence of cardiac pacemaker: Secondary | ICD-10-CM | POA: Diagnosis not present

## 2018-09-28 DIAGNOSIS — I2510 Atherosclerotic heart disease of native coronary artery without angina pectoris: Secondary | ICD-10-CM

## 2018-09-28 DIAGNOSIS — Z7689 Persons encountering health services in other specified circumstances: Secondary | ICD-10-CM

## 2018-09-28 LAB — POCT GLYCOSYLATED HEMOGLOBIN (HGB A1C): HbA1c, POC (controlled diabetic range): 6 % (ref 0.0–7.0)

## 2018-09-28 NOTE — Progress Notes (Signed)
Patient presents to clinic today to establish care. Pleasant 73 year old male who  has a past medical history of Acute renal failure ([place]) (02/09/2013), Aortic stenosis (08/25/2010), Arthritis, Asthma, AV BLOCK, COMPLETE, CAD (coronary artery disease), Carotid artery disease ([place]), Diabetes mellitus without complication ([place]), DIVERTICULOSIS, COLON (10/17/2007), Ejection fraction, GERD (gastroesophageal reflux disease), GOUT (01/16/2007), Headache, HEMORRHOIDS, INTERNAL (11/01/2008), HYPERLIPIDEMIA (01/16/2007), HYPERTENSION (01/16/2007), Impaired glucose tolerance, LEG EDEMA (11/27/2008), Special screening for malignant neoplasm of prostate, and Warfarin anticoagulation.  He is a former patient of Dr. Burnice Logan   Acute Concerns: Establish Care  Chronic Issues: DM -Takes metformin 1000 mg twice a day.  Appears as though he has been well controlled for a significant amount of time. He checks his blood sugars at home and reports readings 120 or below  Lab Results  Component Value Date   HGBA1C 6.0 (H) 03/09/2018    Hyperlipidemia/CAD-  Prescribed Lipitor 40 mg daily and is on chronic anti coagulation with warfarin. Lab Results  Component Value Date   CHOL 120 10/11/2017   HDL 43.30 10/11/2017   LDLCALC 55 10/11/2017   TRIG 109.0 10/11/2017   CHOLHDL 3 10/11/2017   HTN -Prescribe losartan 100 mg, Lasix 40 mg   and metoprolol XL 100 mg daily BP Readings from Last 3 Encounters:  09/28/18 (!) 144/82  08/28/18 130/72  06/07/18 132/82   Aortic stenosis with mechanical valve replacement -H/o Bantell procedure with mechanical aortic valve replacement in 2005 -Last echocardiogram in 2017 with normal function and no concerns -Followed by Coumadin clinic with stable INR, every 6 weeks  Coronary artery disease status post CABG -Bypass surgery at the time of aortic valve replacement in 2005. -Her stress test from 2013 showed no ischemia -On beta-blocker and statin  Osteoarthritis - bilateral  knees. Is followed by Orthopedics, Dr. Gladstone Lighter. Had knee injections last week.   Health Maintenance: Dental -- Routine  Vision -- Routine  Immunizations -- UTD Colonoscopy -- due  Treatment Team  Cardiology - Dr. Lovena Le and [place]    Past Medical History:  Diagnosis Date   Acute renal failure ([place]) 02/09/2013   Aortic stenosis 08/25/2010   a. [place] aortic root replacement with a St. Jude mechanical valve and Hemashield conduit 02/2004.   Arthritis    Asthma    AV BLOCK, COMPLETE    a. s/p St Jude dual chamber pacemaker 02/2004.   CAD (coronary artery disease)    a. s/p CABGx2 (LIMA-dLAD, SVG-Cx). b. Low risk nuc 12/2012 without ischemia, EF 46% mild apical hypokinesia (EF 55% inf HK by echo).   Carotid artery disease ([facility])    a. 0-39% bilateral ICA stenosis, stable mild hard plaque in carotid bulbs. F/u 03/2014 recommended.   Diabetes mellitus without complication ([place])    type 2    DIVERTICULOSIS, COLON 10/17/2007   Ejection fraction    a. EF 55% with inf HK, mild MR by echo 12/2012.   GERD (gastroesophageal reflux disease)    hxof    GOUT 01/16/2007   Headache    hx of migraines    HEMORRHOIDS, INTERNAL 11/01/2008   HYPERLIPIDEMIA 01/16/2007   HYPERTENSION 01/16/2007   Impaired glucose tolerance    LEG EDEMA 11/27/2008   Special screening for malignant neoplasm of prostate    Warfarin anticoagulation    AVR    Past Surgical History:  Procedure Laterality Date   AORTIC VALVE REPLACEMENT     CARDIAC CATHETERIZATION  06/2003   CIRCUMCISION  COLONOSCOPY     CORONARY ARTERY BYPASS GRAFT     coronary stents      prior to bypass    EP IMPLANTABLE DEVICE N/A 05/06/2016   Procedure: PPM Generator Changeout;  Surgeon: Evans Lance, MD;  Location: [facility];  Service: Cardiovascular;  Laterality: N/A;   ESOPHAGOGASTRODUODENOSCOPY     HERNIA REPAIR     INGUINAL HERNIA REPAIR Bilateral 02/07/2013   Procedure: LAPAROSCOPIC  BILATERAL INGUINAL HERNIA REPAIR;  Surgeon: Imogene Burn. Georgette Dover, MD;  Location: [facility];  Service: General;  Laterality: Bilateral;   INSERTION OF MESH Bilateral 02/07/2013   Procedure: INSERTION OF MESH;  Surgeon: Imogene Burn. Georgette Dover, MD;  Location: [facility];  Service: General;  Laterality: Bilateral;   LUMBAR LAMINECTOMY/DECOMPRESSION MICRODISCECTOMY N/A 03/15/2018   Procedure: Complete decompressive lumbar laminectomy for spinal stenosis of L4-L5 and foraminotomy for L4-L5 root on right;  Surgeon: [name], MD;  Location: [facility];  Service: Orthopedics;  Laterality: N/A;   PACEMAKER INSERTION     SHOULDER SURGERY Bilateral     Current Outpatient Medications on File Prior to Visit  Medication Sig Dispense Refill   ACCU-CHEK FASTCLIX LANCETS MISC USE TO CHECK BLOOD SUGAR TWICE A DAY AND AS NEEDED 306 each 3   acetaminophen (TYLENOL ARTHRITIS PAIN) 650 MG CR tablet Take 1,300 mg by mouth 2 (two) times daily.     albuterol (PROVENTIL HFA;VENTOLIN HFA) 108 (90 Base) MCG/ACT inhaler INHALE 2 PUFFS INTO THE LUNGS EVERY 6 (SIX) HOURS AS NEEDED FOR WHEEZING OR SHORTNESS OF BREATH. 54 g 3   Ascorbic Acid (VITAMIN C) 500 MG tablet Take 1,000 mg by mouth daily.      aspirin 81 MG tablet Take 81 mg by mouth every evening.      atorvastatin (LIPITOR) 40 MG tablet TAKE 1 TABLET EVERY DAY AT 6 PM 90 tablet 3   calcium carbonate (OS-CAL) 600 MG TABS Take 600 mg by mouth 2 (two) times daily with a meal.     cetirizine (ZYRTEC) 10 MG tablet Take 10 mg by mouth daily.      furosemide (LASIX) 40 MG tablet Take 1 tablet (40 mg total) by mouth 2 (two) times daily. 180 tablet 0   Glucosamine-Chondroitin 250-200 MG CAPS Take 2 capsules by mouth daily.      glucose blood (ACCU-CHEK SMARTVIEW) test strip USE TO CHECK BLOOD SUGAR TWICE A DAY AND PRN 300 each 3   guaiFENesin-dextromethorphan (ROBITUSSIN DM) 100-10 MG/5ML syrup Take 5 mLs by mouth every 4 (four) hours as needed for cough.     losartan  (COZAAR) 100 MG tablet Take 1 tablet (100 mg total) by mouth daily. 90 tablet 2   metFORMIN (GLUCOPHAGE) 1000 MG tablet TAKE 1 TABLET TWICE DAILY WITH MEALS 180 tablet 3   metoprolol succinate (TOPROL-XL) 100 MG 24 hr tablet Take 1 tablet (100 mg total) by mouth daily. 90 tablet 0   Misc Natural Products (TART CHERRY ADVANCED) CAPS Take 2 capsules by mouth daily.      Multiple Vitamin (MULTIVITAMIN) tablet Take 1 tablet by mouth daily.       nitroGLYCERIN (NITROSTAT) 0.4 MG SL tablet Place 1 tablet (0.4 mg total) under the tongue every 5 (five) minutes as needed. For chest pain 25 tablet 3   Omega-3 Fatty Acids (FISH OIL) 1200 MG CAPS Take 1,200 mg by mouth daily.      potassium chloride (K-DUR,KLOR-CON) 10 MEQ tablet TAKE 1 TABLET TWICE DAILY 180 tablet 0   Probiotic  Product (PHILLIPS COLON HEALTH PO) Take 1 capsule by mouth daily.     vitamin B-12 (CYANOCOBALAMIN) 1000 MCG tablet Take 1,000 mcg by mouth daily.     warfarin (COUMADIN) 10 MG tablet TAKE 1 TABLET DAILY OR AS DIRECTED BY COUMADIN CLINIC 90 tablet 1   No current facility-administered medications on file prior to visit.     Allergies  Allergen Reactions   Bee Venom Shortness Of Breath and Swelling   Codeine Phosphate Hives, Shortness Of Breath, Itching, Rash and Other (See Comments)    Tolerated multiple doses of IV morphine   Metoprolol Tartrate Other (See Comments)    loss of vision. Can take the XL tablet not regular    Tramadol     Pt has hallucinations    Ramipril Rash and Cough    Family History  Problem Relation Age of Onset   Heart attack Father    Kidney cancer Father    Hypertension Mother    Diabetes Mother    Diabetes Brother    Epilepsy Sister    Diabetes Sister    Diabetes Brother     Social History   Socioeconomic History   Marital status: Married    Spouse name: Not on file   Number of children: 3   Years of education: Not on file   Highest education level: Not on  file  Occupational History   Occupation: retired  Scientist, product/process development strain: Not on file   Food insecurity:    Worry: Not on file    Inability: Not on file   Transportation needs:    Medical: Not on file    Non-medical: Not on file  Tobacco Use   Smoking status: Former Smoker    Last attempt to quit: 07/12/1973    Years since quitting: 45.2   Smokeless tobacco: Never Used  Substance and Sexual Activity   Alcohol use: No   Drug use: No   Sexual activity: Never  Lifestyle   Physical activity:    Days per week: Not on file    Minutes per session: Not on file   Stress: Not on file  Relationships   Social connections:    Talks on phone: Not on file    Gets together: Not on file    Attends religious service: Not on file    Active member of club or organization: Not on file    Attends meetings of clubs or organizations: Not on file    Relationship status: Not on file   Intimate partner violence:    Fear of current or ex partner: Not on file    Emotionally abused: Not on file    Physically abused: Not on file    Forced sexual activity: Not on file  Other Topics Concern   Not on file  Social History Narrative   Not on file    Review of Systems  Constitutional: Negative.   HENT: Negative.   Eyes: Negative.   Respiratory: Negative.   Cardiovascular: Positive for leg swelling (right leg).  Gastrointestinal: Negative.   Genitourinary: Negative.   Musculoskeletal: Negative.   Skin: Negative.   Neurological: Negative.   Endo/Heme/Allergies: Negative.   All other systems reviewed and are negative.   BP (!) 144/82    Temp 97.6 F (36.4 C)    Ht 5' 6.75" (1.695 m)    Wt 220 lb (99.8 kg)    BMI 34.72 kg/m   Physical Exam Vitals signs and nursing note  reviewed.  HENT:     Nose: Nose normal.     Mouth/Throat:     Mouth: Mucous membranes are moist.     Pharynx: Oropharynx is clear.  Eyes:     Extraocular Movements: Extraocular movements  intact.     Pupils: Pupils are equal, round, and reactive to light.  Cardiovascular:     Rate and Rhythm: Normal rate and regular rhythm.     Pulses: Normal pulses.     Heart sounds: Normal heart sounds.     Comments: S2 click   Pulmonary:     Effort: Pulmonary effort is normal.     Breath sounds: Normal breath sounds.  Musculoskeletal:     Right lower leg: Edema (trace non pitting ) present.  Skin:    General: Skin is warm and dry.  Neurological:     General: No focal deficit present.  Psychiatric:        Mood and Affect: Mood normal.        Behavior: Behavior normal.        Thought Content: Thought content normal.        Judgment: Judgment normal.     Recent Results (from the past 2160 hour(s))  POCT INR     Status: None   Collection Time: 08/08/18 10:17 AM  Result Value Ref Range   INR 2.6 2.0 - 3.0  CUP PACEART REMOTE DEVICE CHECK     Status: None   Collection Time: 08/09/18  7:00 AM  Result Value Ref Range   Date Time Interrogation Session [id number]    Pulse Generator Manufacturer SJCR    Pulse Gen Model 2272 Assurity MRI    Pulse Gen Serial Number [id number]    Clinic Name [facility] Pulse Generator Type Implantable Pulse Generator    Implantable Pulse Generator Implant Date 96222979    Implantable Lead Manufacturer PACE    Implantable Lead Model 1488TC Tendril SDX    Implantable Lead Serial Number [id number]    Implantable Lead Implant Date 89211941    Implantable Lead Location Detail 1 APEX    Implantable Lead Location [id number]    Implantable Lead Manufacturer PACE    Implantable Lead Model 1488TC Tendril SDX    Implantable Lead Serial Number [id number]    Implantable Lead Implant Date [id number]    Implantable Lead Location Detail 1 APPENDAGE    Implantable Lead Location [phone]    Lead Channel Setting Sensing Sensitivity 4.0 mV   Lead Channel Setting Sensing Adaptation Mode Fixed Pacing    Lead Channel Setting Pacing Amplitude 2.125    Lead  Channel Setting Pacing Pulse Width 0.5 ms   Lead Channel Setting Pacing Amplitude 0.75 V   Lead Channel Status     Lead Channel Impedance Value 480 ohm   Lead Channel Sensing Intrinsic Amplitude 2.7 mV   Lead Channel Pacing Threshold Amplitude 1.125 V   Lead Channel Pacing Threshold Pulse Width 0.5 ms   Lead Channel Status     Lead Channel Impedance Value 730 ohm   Lead Channel Sensing Intrinsic Amplitude 12.0 mV   Lead Channel Pacing Threshold Amplitude 0.5 V   Lead Channel Pacing Threshold Pulse Width 0.5 ms   Battery Status MOS    Battery Remaining Longevity 128 mo   Battery Remaining Percentage 95.5 %   Battery Voltage 2.99 V   Brady Statistic RA Percent Paced 25.0 %   Brady Statistic RV Percent Paced 99.0 %   [employer] AP  VP Percent 26.0 %   Brady Statistic AS VP Percent 74.0 %   Brady Statistic AP VS Percent 1.0 %   Brady Statistic AS VS Percent 1.0 %  POCT INR     Status: None   Collection Time: 09/19/18 10:38 AM  Result Value Ref Range   INR 2.6 2.0 - 3.0    Assessment/Plan: 1. Encounter to establish care - Follow up in April for CPE  - Follow up sooner if needed - Continue to eat healthy. Work on moderate weight loss   2. Diabetes mellitus without complication ([place])  - POC HgB A1c - 6.0  - No change in medication   3. Aortic valve replaced - Follow up with Cardiology as directed   4. PACEMAKER, PERMANENT - Follow up with Cardiology as directed   5. Essential hypertension - Well controlled. No change in medication   6. Coronary artery disease involving native coronary artery of native heart without angina pectoris - Continue with Lipitor   7. Warfarin anticoagulation - Continue with coumadin clinic   [name], NP

## 2018-09-28 NOTE — Patient Instructions (Addendum)
It was great meeting you today   Please follow up with me after April 2nd for your physical   If you need anything before that please let me know   Your A1c was 6.0

## 2018-10-07 ENCOUNTER — Encounter: Payer: Self-pay | Admitting: Adult Health

## 2018-10-11 ENCOUNTER — Other Ambulatory Visit: Payer: Self-pay | Admitting: Adult Health

## 2018-10-11 NOTE — Telephone Encounter (Signed)
Sent to the pharmacy by e-scribe. 

## 2018-10-12 NOTE — Telephone Encounter (Signed)
I did not mean to open phone note.

## 2018-10-17 ENCOUNTER — Other Ambulatory Visit: Payer: Self-pay | Admitting: Adult Health

## 2018-10-17 MED ORDER — FUROSEMIDE 40 MG PO TABS: 40.0000 mg | ORAL_TABLET | Freq: Two times a day (BID) | ORAL | 0 refills | Status: DC

## 2018-10-17 MED ORDER — ALBUTEROL SULFATE HFA 108 (90 BASE) MCG/ACT IN AERS: 2.0000 | INHALATION_SPRAY | Freq: Four times a day (QID) | RESPIRATORY_TRACT | 1 refills | Status: DC | PRN

## 2018-10-17 NOTE — Telephone Encounter (Signed)
Requested Prescriptions  Pending Prescriptions Disp Refills  . albuterol (PROVENTIL HFA;VENTOLIN HFA) 108 (90 Base) MCG/ACT inhaler 54 g 1    Sig: Inhale 2 puffs into the lungs every 6 (six) hours as needed for wheezing or shortness of breath.     Pulmonology:  Beta Agonists Failed - 10/17/2018  2:59 AM      Failed - One inhaler should last at least one month. If the patient is requesting refills earlier, contact the patient to check for uncontrolled symptoms.      Passed - Valid encounter within last 12 months    Recent Outpatient Visits          2 weeks ago Diabetes mellitus without complication ([place])   Therapist, music at [facility], [place], NP   6 months ago Diabetes mellitus without complication ([place])   Therapist, music at [facility], Doretha Sou, MD   1 year ago Diabetes mellitus without complication ([place])   [place] at [facility], Doretha Sou, MD   1 year ago Need for influenza vaccination   Therapist, music at [facility], Doretha Sou, MD   2 years ago Encounter for preventive health examination   Therapist, music at [facility], Doretha Sou, MD      Future Appointments            In 2 months Nafziger, Tommi Rumps, NP [employer] at [place], [facility]         . furosemide (LASIX) 40 MG tablet 180 tablet 0    Sig: Take 1 tablet (40 mg total) by mouth 2 (two) times daily.     Cardiovascular:  Diuretics - Loop Failed - 10/17/2018  2:59 AM      Failed - Last BP in normal range    BP Readings from Last 1 Encounters:  09/28/18 (!) 144/82         Passed - K in normal range and within 360 days    Potassium  Date Value Ref Range Status  03/09/2018 4.6 3.5 - 5.1 mmol/L Final         Passed - Ca in normal range and within 360 days    Calcium  Date Value Ref Range Status  03/09/2018 9.1 8.9 - 10.3 mg/dL Final         Passed - Na in normal range and within 360 days    Sodium  Date Value Ref Range Status  03/09/2018 141  135 - 145 mmol/L Final         Passed - Cr in normal range and within 360 days    Creat  Date Value Ref Range Status  04/29/2016 0.87 0.70 - 1.18 mg/dL Final    Comment:      For patients > or = 75 years of age: The upper reference limit for Creatinine is approximately 13% higher for people identified as African-American.      Creatinine, Ser  Date Value Ref Range Status  03/09/2018 0.79 0.61 - 1.24 mg/dL Final         Passed - Valid encounter within last 6 months    Recent Outpatient Visits          2 weeks ago Diabetes mellitus without complication ([place])   Therapist, music at [place], NP   6 months ago Diabetes mellitus without complication ([place])   Therapist, music at [facility], Doretha Sou, MD   1 year ago Diabetes mellitus without complication ([place])   [place] at [facility], Doretha Sou, MD  1 year ago Need for influenza vaccination   Therapist, music at [facility], Doretha Sou, MD   2 years ago Encounter for preventive health examination   [employer] at [facility], Doretha Sou, MD      Future Appointments            In 2 months Nafziger, Tommi Rumps, NP [employer] at [place], [facility]

## 2018-10-20 ENCOUNTER — Encounter: Payer: Self-pay | Admitting: Adult Health

## 2018-10-23 MED ORDER — FUROSEMIDE 40 MG PO TABS: 40.0000 mg | ORAL_TABLET | Freq: Two times a day (BID) | ORAL | 0 refills | Status: DC

## 2018-10-23 MED ORDER — ALBUTEROL SULFATE HFA 108 (90 BASE) MCG/ACT IN AERS: 2.0000 | INHALATION_SPRAY | Freq: Four times a day (QID) | RESPIRATORY_TRACT | 0 refills | Status: DC | PRN

## 2018-10-23 NOTE — Addendum Note (Signed)
Addended by: Miles Costain T on: 10/23/2018 08:24 AM   Modules accepted: Orders

## 2018-10-23 NOTE — Telephone Encounter (Signed)
Sent to wrong pharmacy.  Resent to [facility] for 90 day supply.

## 2018-10-28 ENCOUNTER — Encounter: Payer: Self-pay | Admitting: Adult Health

## 2018-10-30 ENCOUNTER — Telehealth: Payer: Self-pay

## 2018-10-30 ENCOUNTER — Other Ambulatory Visit: Payer: Self-pay | Admitting: Adult Health

## 2018-10-30 NOTE — Telephone Encounter (Signed)

## 2018-10-31 ENCOUNTER — Other Ambulatory Visit: Payer: Self-pay

## 2018-10-31 ENCOUNTER — Ambulatory Visit (INDEPENDENT_AMBULATORY_CARE_PROVIDER_SITE_OTHER): Payer: Medicare HMO | Admitting: *Deleted

## 2018-10-31 DIAGNOSIS — Z952 Presence of prosthetic heart valve: Secondary | ICD-10-CM | POA: Diagnosis not present

## 2018-10-31 DIAGNOSIS — Z09 Encounter for follow-up examination after completed treatment for conditions other than malignant neoplasm: Secondary | ICD-10-CM | POA: Diagnosis not present

## 2018-10-31 DIAGNOSIS — Z5181 Encounter for therapeutic drug level monitoring: Secondary | ICD-10-CM

## 2018-10-31 LAB — POCT INR: INR: 3.2 — AB (ref 2.0–3.0)

## 2018-10-31 NOTE — Telephone Encounter (Signed)
Sent to the pharmacy by e-scribe. 

## 2018-11-01 ENCOUNTER — Encounter: Payer: Medicare HMO | Admitting: Adult Health

## 2018-11-08 ENCOUNTER — Other Ambulatory Visit: Payer: Self-pay

## 2018-11-08 ENCOUNTER — Ambulatory Visit (INDEPENDENT_AMBULATORY_CARE_PROVIDER_SITE_OTHER): Payer: Medicare HMO | Admitting: *Deleted

## 2018-11-08 DIAGNOSIS — I442 Atrioventricular block, complete: Secondary | ICD-10-CM

## 2018-11-08 LAB — CUP PACEART REMOTE DEVICE CHECK
Battery Remaining Longevity: 128 mo
Battery Remaining Percentage: 95.5 %
Battery Voltage: 2.99 V
Brady Statistic AP VP Percent: 27 %
Brady Statistic AP VS Percent: 1 %
Brady Statistic AS VP Percent: 73 %
Brady Statistic AS VS Percent: 1 %
Brady Statistic RA Percent Paced: 26 %
Brady Statistic RV Percent Paced: 99 %
Date Time Interrogation Session: 20200429060022
Implantable Lead Implant Date: 20050816
Implantable Lead Implant Date: 20050816
Implantable Lead Location: 753859
Implantable Lead Location: 753860
Implantable Pulse Generator Implant Date: 20171026
Lead Channel Impedance Value: 490 Ohm
Lead Channel Impedance Value: 740 Ohm
Lead Channel Pacing Threshold Amplitude: 0.5 V
Lead Channel Pacing Threshold Amplitude: 1.25 V
Lead Channel Pacing Threshold Pulse Width: 0.5 ms
Lead Channel Pacing Threshold Pulse Width: 0.5 ms
Lead Channel Sensing Intrinsic Amplitude: 12 mV
Lead Channel Sensing Intrinsic Amplitude: 4.1 mV
Lead Channel Setting Pacing Amplitude: 0.75 V
Lead Channel Setting Pacing Amplitude: 2.25 V
Lead Channel Setting Pacing Pulse Width: 0.5 ms
Lead Channel Setting Sensing Sensitivity: 4 mV
Pulse Gen Model: 2272
Pulse Gen Serial Number: 3182792

## 2018-11-17 NOTE — Progress Notes (Signed)
Remote pacemaker transmission.   

## 2018-11-20 ENCOUNTER — Telehealth: Payer: Self-pay | Admitting: Adult Health

## 2018-11-20 NOTE — Telephone Encounter (Signed)
Copied from [place] [id number]. Topic: Quick Communication - Rx Refill/Question >> Nov 20, 2018  1:41 PM Virl Axe D wrote: Medication: metFORMIN (GLUCOPHAGE) 1000 MG tablet   Has the patient contacted their pharmacy? Yes.   (Agent: If no, request that the patient contact the pharmacy for the refill.) (Agent: If yes, when and what did the pharmacy advise?)  Preferred Pharmacy (with phone number or street name): [facility], [place] [phone] (Phone) [phone] (Fax)    Agent: Please be advised that RX refills may take up to 3 business days. We ask that you follow-up with your pharmacy.

## 2018-11-21 MED ORDER — METFORMIN HCL 1000 MG PO TABS: 1000.0000 mg | ORAL_TABLET | Freq: Two times a day (BID) | ORAL | 0 refills | Status: DC

## 2018-11-21 NOTE — Telephone Encounter (Signed)
Sent to the pharmacy by e-scribe. 

## 2018-12-07 ENCOUNTER — Telehealth: Payer: Self-pay | Admitting: Pharmacist

## 2018-12-07 NOTE — Telephone Encounter (Signed)
1. COVID-19 Pre-Screening Questions:  . In the past 7 to 10 days have you had a cough,  shortness of breath, headache, congestion, fever (100 or greater) body aches, chills, sore throat, or sudden loss of taste or sense of smell? no . Have you been around anyone with known Covid 19. no . Have you been around anyone who is awaiting Covid 19 test results in the past 7 to 10 days? no . Have you been around anyone who has been exposed to Covid 19, or has mentioned symptoms of Covid 19 within the past 7 to 10 days? no   2. Pt advised of visitor restrictions (no visitors allowed except if needed to conduct the visit). Also advised to arrive at appointment time and wear a mask.   3. Patient aware of in-office visit   

## 2018-12-12 ENCOUNTER — Ambulatory Visit (INDEPENDENT_AMBULATORY_CARE_PROVIDER_SITE_OTHER): Payer: Medicare HMO | Admitting: *Deleted

## 2018-12-12 ENCOUNTER — Other Ambulatory Visit: Payer: Self-pay

## 2018-12-12 DIAGNOSIS — Z952 Presence of prosthetic heart valve: Secondary | ICD-10-CM

## 2018-12-12 DIAGNOSIS — Z5181 Encounter for therapeutic drug level monitoring: Secondary | ICD-10-CM

## 2018-12-12 DIAGNOSIS — Z09 Encounter for follow-up examination after completed treatment for conditions other than malignant neoplasm: Secondary | ICD-10-CM | POA: Diagnosis not present

## 2018-12-12 LAB — POCT INR: INR: 3.6 — AB (ref 2.0–3.0)

## 2018-12-12 NOTE — Patient Instructions (Addendum)
Description    Hold tonight's dose and then continue dosage of 1 tablet every day except for Thursday and take half a tablet. Recheck in 3 weeks. Call with any changes., # [phone].

## 2018-12-28 ENCOUNTER — Ambulatory Visit (INDEPENDENT_AMBULATORY_CARE_PROVIDER_SITE_OTHER): Payer: Medicare HMO | Admitting: Adult Health

## 2018-12-28 ENCOUNTER — Other Ambulatory Visit: Payer: Self-pay

## 2018-12-28 ENCOUNTER — Encounter: Payer: Self-pay | Admitting: Adult Health

## 2018-12-28 VITALS — BP 146/86 | Temp 98.5°F | Ht 67.0 in | Wt 215.0 lb

## 2018-12-28 DIAGNOSIS — Z0000 Encounter for general adult medical examination without abnormal findings: Secondary | ICD-10-CM | POA: Diagnosis not present

## 2018-12-28 DIAGNOSIS — Z1211 Encounter for screening for malignant neoplasm of colon: Secondary | ICD-10-CM | POA: Diagnosis not present

## 2018-12-28 DIAGNOSIS — R259 Unspecified abnormal involuntary movements: Secondary | ICD-10-CM

## 2018-12-28 DIAGNOSIS — I10 Essential (primary) hypertension: Secondary | ICD-10-CM

## 2018-12-28 DIAGNOSIS — I2510 Atherosclerotic heart disease of native coronary artery without angina pectoris: Secondary | ICD-10-CM

## 2018-12-28 DIAGNOSIS — Z125 Encounter for screening for malignant neoplasm of prostate: Secondary | ICD-10-CM | POA: Diagnosis not present

## 2018-12-28 DIAGNOSIS — M170 Bilateral primary osteoarthritis of knee: Secondary | ICD-10-CM

## 2018-12-28 DIAGNOSIS — E785 Hyperlipidemia, unspecified: Secondary | ICD-10-CM

## 2018-12-28 DIAGNOSIS — I442 Atrioventricular block, complete: Secondary | ICD-10-CM

## 2018-12-28 DIAGNOSIS — E119 Type 2 diabetes mellitus without complications: Secondary | ICD-10-CM

## 2018-12-28 DIAGNOSIS — G252 Other specified forms of tremor: Secondary | ICD-10-CM

## 2018-12-28 LAB — CBC WITH DIFFERENTIAL/PLATELET
Basophils Absolute: 0.1 10*3/uL (ref 0.0–0.1)
Basophils Relative: 0.7 % (ref 0.0–3.0)
Eosinophils Absolute: 0.2 10*3/uL (ref 0.0–0.7)
Eosinophils Relative: 1.9 % (ref 0.0–5.0)
HCT: 42.3 % (ref 39.0–52.0)
Hemoglobin: 13.9 g/dL (ref 13.0–17.0)
Lymphocytes Relative: 25 % (ref 12.0–46.0)
Lymphs Abs: 2.2 10*3/uL (ref 0.7–4.0)
MCHC: 32.8 g/dL (ref 30.0–36.0)
MCV: 89.9 fl (ref 78.0–100.0)
Monocytes Absolute: 0.6 10*3/uL (ref 0.1–1.0)
Monocytes Relative: 6.3 % (ref 3.0–12.0)
Neutro Abs: 5.8 10*3/uL (ref 1.4–7.7)
Neutrophils Relative %: 66.1 % (ref 43.0–77.0)
Platelets: 213 10*3/uL (ref 150.0–400.0)
RBC: 4.7 Mil/uL (ref 4.22–5.81)
RDW: 15 % (ref 11.5–15.5)
WBC: 8.7 10*3/uL (ref 4.0–10.5)

## 2018-12-28 LAB — COMPREHENSIVE METABOLIC PANEL WITH GFR
ALT: 19 U/L (ref 0–53)
AST: 24 U/L (ref 0–37)
Albumin: 4.1 g/dL (ref 3.5–5.2)
Alkaline Phosphatase: 51 U/L (ref 39–117)
BUN: 17 mg/dL (ref 6–23)
CO2: 30 meq/L (ref 19–32)
Calcium: 9.2 mg/dL (ref 8.4–10.5)
Chloride: 106 meq/L (ref 96–112)
Creatinine, Ser: 0.84 mg/dL (ref 0.40–1.50)
GFR: 89.2 mL/min
Glucose, Bld: 92 mg/dL (ref 70–99)
Potassium: 4.4 meq/L (ref 3.5–5.1)
Sodium: 142 meq/L (ref 135–145)
Total Bilirubin: 0.5 mg/dL (ref 0.2–1.2)
Total Protein: 5.9 g/dL — ABNORMAL LOW (ref 6.0–8.3)

## 2018-12-28 LAB — HEMOGLOBIN A1C: Hgb A1c MFr Bld: 6.4 % (ref 4.6–6.5)

## 2018-12-28 LAB — LIPID PANEL
Cholesterol: 116 mg/dL (ref 0–200)
HDL: 47.1 mg/dL (ref >39.00)
LDL Cholesterol: 52 mg/dL (ref 0–99)
NonHDL: 68.98
Total CHOL/HDL Ratio: 2
Triglycerides: 86 mg/dL (ref 0.0–149.0)
VLDL: 17.2 mg/dL (ref 0.0–40.0)

## 2018-12-28 LAB — TSH: TSH: 1.47 u[IU]/mL (ref 0.35–4.50)

## 2018-12-28 LAB — PSA: PSA: 1.29 ng/mL (ref 0.10–4.00)

## 2018-12-28 MED ORDER — FLUTICASONE PROPIONATE 50 MCG/ACT NA SUSP: 2.0000 | Freq: Every day | NASAL | 6 refills | Status: AC

## 2018-12-28 NOTE — Patient Instructions (Signed)
It was great seeing you today   We will follow up with you regarding your blood work   I have sent in a nasal spray called Flonase to help with the nasal drip   Someone will contract you to schedule your colonoscopy   Let me know if you change your mind about seeing a neurologist for the tremors.

## 2018-12-28 NOTE — Progress Notes (Signed)
Subjective:    Patient ID: [name], male    DOB: Aug 08, 1945, 73 y.o.   MRN: [id number]  HPI Patient presents for yearly preventative medicine examination. He is a pleasant 73 year old male who  has a past medical history of Acute renal failure ([place]) (02/09/2013), Aortic stenosis (08/25/2010), Arthritis, Asthma, AV BLOCK, COMPLETE, CAD (coronary artery disease), Carotid artery disease ([place]), Diabetes mellitus without complication ([place]), DIVERTICULOSIS, COLON (10/17/2007), Ejection fraction, GERD (gastroesophageal reflux disease), GOUT (01/16/2007), Headache, HEMORRHOIDS, INTERNAL (11/01/2008), HYPERLIPIDEMIA (01/16/2007), HYPERTENSION (01/16/2007), Impaired glucose tolerance, LEG EDEMA (11/27/2008), Special screening for malignant neoplasm of prostate, and Warfarin anticoagulation.   DM II -Controlled with metformin 1000 mg twice a day.  Does check his blood sugars at home and reports readings of 120 or below.  He denies any symptoms of hypoglycemia Lab Results  Component Value Date   HGBA1C 6.0 09/28/2018   Hyperlipidemia/CAD -Prescribed Lipitor 40 mg daily and is on chronic anticoagulation with warfarin Lab Results  Component Value Date   CHOL 120 10/11/2017   HDL 43.30 10/11/2017   LDLCALC 55 10/11/2017   TRIG 109.0 10/11/2017   CHOLHDL 3 10/11/2017   HTN -currently prescribed losartan 100 mg, Lasix 40 mg, and metoprolol extended release 100 mg daily. He did not take his blood pressure medication this morning.  BP Readings from Last 3 Encounters:  12/28/18 (!) 146/86  09/28/18 (!) 144/82  08/28/18 130/72   Aortic Stenosis with mechanical Valve replacement  -History of Bentall procedure with mechanical aortic valve replacement in 2005.  His last echocardiogram in 2017 showed normal function and no concerning issues.  Is followed by the Coumadin clinic with a stable INR.  Coronary Artery Disease s/p CABG -Bypass surgery at the time of aortic valve replacement in 2005.  He is on a  beta-blocker and statin.  Complete AV Block - s/p pacemaker.   Osteoarthritis -is followed by orthopedics for bilateral knee pain. He receives steroid injections every 3-4 months.   Upper Extremity Tremors - worse on right then left. Reports that he has had these tremors since his back surgery in 2019. Denies issues with feeding or ADLs  All immunizations and health maintenance protocols were reviewed with the patient and needed orders were placed.  Appropriate screening laboratory values were ordered for the patient including screening of hyperlipidemia, renal function and hepatic function. If indicated by BPH, a PSA was ordered.  Medication reconciliation,  past medical history, social history, problem list and allergies were reviewed in detail with the patient  Goals were established with regard to weight loss, exercise, and  diet in compliance with medications  End of life planning was discussed.  He is up-to-date on routine dental and vision screens.  He is due for colonoscopy  Review of Systems  Constitutional: Negative.   HENT: Positive for postnasal drip.   Respiratory: Negative.   Cardiovascular: Negative.   Gastrointestinal: Negative.   Genitourinary: Negative.   Musculoskeletal: Positive for arthralgias.  Skin: Negative.   Neurological: Positive for tremors.  Hematological: Negative.   Psychiatric/Behavioral: Negative.   All other systems reviewed and are negative.  Past Medical History:  Diagnosis Date   Acute renal failure ([place]) 02/09/2013   Aortic stenosis 08/25/2010   a. Bentall aortic root replacement with a St. Jude mechanical valve and Hemashield conduit 02/2004.   Arthritis    Asthma    AV BLOCK, COMPLETE    a. s/p St Jude dual chamber pacemaker 02/2004.   CAD (coronary artery  disease)    a. s/p CABGx2 (LIMA-dLAD, SVG-Cx). b. Low risk nuc 12/2012 without ischemia, EF 46% mild apical hypokinesia (EF 55% inf HK by echo).   Carotid artery disease ([facility])      a. 0-39% bilateral ICA stenosis, stable mild hard plaque in carotid bulbs. F/u 03/2014 recommended.   Diabetes mellitus without complication ([place])    type 2    DIVERTICULOSIS, COLON 10/17/2007   Ejection fraction    a. EF 55% with inf HK, mild MR by echo 12/2012.   GERD (gastroesophageal reflux disease)    hxof    GOUT 01/16/2007   Headache    hx of migraines    HEMORRHOIDS, INTERNAL 11/01/2008   HYPERLIPIDEMIA 01/16/2007   HYPERTENSION 01/16/2007   Impaired glucose tolerance    LEG EDEMA 11/27/2008   Special screening for malignant neoplasm of prostate    Warfarin anticoagulation    AVR    Social History   Socioeconomic History   Marital status: Married    Spouse name: Not on file   Number of children: 3   Years of education: Not on file   Highest education level: Not on file  Occupational History   Occupation: retired  Scientist, product/process development strain: Not on file   Food insecurity    Worry: Not on file    Inability: Not on file   Transportation needs    Medical: Not on file    Non-medical: Not on file  Tobacco Use   Smoking status: Former Smoker    Quit date: 07/12/1973    Years since quitting: 45.4   Smokeless tobacco: Never Used  Substance and Sexual Activity   Alcohol use: No   Drug use: No   Sexual activity: Never  Lifestyle   Physical activity    Days per week: Not on file    Minutes per session: Not on file   Stress: Not on file  Relationships   Social connections    Talks on phone: Not on file    Gets together: Not on file    Attends religious service: Not on file    Active member of club or organization: Not on file    Attends meetings of clubs or organizations: Not on file    Relationship status: Not on file   Intimate partner violence    Fear of current or ex partner: Not on file    Emotionally abused: Not on file    Physically abused: Not on file    Forced sexual activity: Not on file  Other Topics Concern    Not on file  Social History Narrative   Not on file    Past Surgical History:  Procedure Laterality Date   AORTIC VALVE REPLACEMENT     CARDIAC CATHETERIZATION  06/2003   CIRCUMCISION     COLONOSCOPY     CORONARY ARTERY BYPASS GRAFT     coronary stents      prior to bypass    EP IMPLANTABLE DEVICE N/A 05/06/2016   Procedure: [place];  Surgeon: Evans Lance, MD;  Location: [facility];  Service: Cardiovascular;  Laterality: N/A;   ESOPHAGOGASTRODUODENOSCOPY     HERNIA REPAIR     INGUINAL HERNIA REPAIR Bilateral 02/07/2013   Procedure: LAPAROSCOPIC BILATERAL INGUINAL HERNIA REPAIR;  Surgeon: Imogene Burn. Georgette Dover, MD;  Location: [facility];  Service: General;  Laterality: Bilateral;   INSERTION OF MESH Bilateral 02/07/2013   Procedure: INSERTION OF MESH;  Surgeon: Rodman Key  Oren Section, MD;  Location: [facility];  Service: General;  Laterality: Bilateral;   LUMBAR LAMINECTOMY/DECOMPRESSION MICRODISCECTOMY N/A 03/15/2018   Procedure: Complete decompressive lumbar laminectomy for spinal stenosis of L4-L5 and foraminotomy for L4-L5 root on right;  Surgeon: [name], MD;  Location: [facility];  Service: Orthopedics;  Laterality: N/A;   PACEMAKER INSERTION     SHOULDER SURGERY Bilateral     Family History  Problem Relation Age of Onset   Heart attack Father    Kidney cancer Father    Hypertension Mother    Diabetes Mother    Diabetes Brother    Epilepsy Sister    Diabetes Sister    Diabetes Brother     Allergies  Allergen Reactions   Bee Venom Shortness Of Breath and Swelling   Codeine Phosphate Hives, Shortness Of Breath, Itching, Rash and Other (See Comments)    Tolerated multiple doses of IV morphine   Metoprolol Tartrate Other (See Comments)    loss of vision. Can take the XL tablet not regular    Tramadol     Pt has hallucinations    Ramipril Rash and Cough    Current Outpatient Medications on File Prior to Visit  Medication Sig  Dispense Refill   ACCU-CHEK FASTCLIX LANCETS MISC USE TO CHECK BLOOD SUGAR TWICE A DAY AND AS NEEDED 306 each 3   acetaminophen (TYLENOL ARTHRITIS PAIN) 650 MG CR tablet Take 1,300 mg by mouth 2 (two) times daily.     albuterol (PROVENTIL HFA;VENTOLIN HFA) 108 (90 Base) MCG/ACT inhaler Inhale 2 puffs into the lungs every 6 (six) hours as needed for wheezing or shortness of breath. 3 Inhaler 0   Ascorbic Acid (VITAMIN C) 500 MG tablet Take 1,000 mg by mouth daily.      aspirin 81 MG tablet Take 81 mg by mouth every evening.      atorvastatin (LIPITOR) 40 MG tablet TAKE 1 TABLET EVERY DAY AT 6 PM 90 tablet 3   calcium carbonate (OS-CAL) 600 MG TABS Take 600 mg by mouth 2 (two) times daily with a meal.     cetirizine (ZYRTEC) 10 MG tablet Take 10 mg by mouth daily.      furosemide (LASIX) 40 MG tablet Take 1 tablet (40 mg total) by mouth 2 (two) times daily. 180 tablet 0   Glucosamine-Chondroitin 250-200 MG CAPS Take 2 capsules by mouth daily.      glucose blood (ACCU-CHEK SMARTVIEW) test strip USE TO CHECK BLOOD SUGAR TWICE A DAY AND PRN 300 each 3   guaiFENesin-dextromethorphan (ROBITUSSIN DM) 100-10 MG/5ML syrup Take 5 mLs by mouth every 4 (four) hours as needed for cough.     losartan (COZAAR) 100 MG tablet Take 1 tablet (100 mg total) by mouth daily. 90 tablet 2   metFORMIN (GLUCOPHAGE) 1000 MG tablet Take 1 tablet (1,000 mg total) by mouth 2 (two) times daily with a meal. 180 tablet 0   metoprolol succinate (TOPROL-XL) 100 MG 24 hr tablet TAKE 1 TABLET EVERY DAY 90 tablet 0   Misc Natural Products (TART CHERRY ADVANCED) CAPS Take 2 capsules by mouth daily.      Multiple Vitamin (MULTIVITAMIN) tablet Take 1 tablet by mouth daily.       nitroGLYCERIN (NITROSTAT) 0.4 MG SL tablet Place 1 tablet (0.4 mg total) under the tongue every 5 (five) minutes as needed. For chest pain 25 tablet 3   Omega-3 Fatty Acids (FISH OIL) 1200 MG CAPS Take 1,200 mg by mouth daily.  potassium  chloride (K-DUR) 10 MEQ tablet TAKE 1 TABLET TWICE DAILY 180 tablet 0   Probiotic Product (PHILLIPS COLON HEALTH PO) Take 1 capsule by mouth daily.     vitamin B-12 (CYANOCOBALAMIN) 1000 MCG tablet Take 1,000 mcg by mouth daily.     warfarin (COUMADIN) 10 MG tablet TAKE 1 TABLET DAILY OR AS DIRECTED BY COUMADIN CLINIC 90 tablet 1   No current facility-administered medications on file prior to visit.     BP (!) 146/86 Comment: NO MEDS   Temp 98.5 F (36.9 C)    Ht 5\' 7"  (1.702 m) Comment: WITH SHOES   Wt 215 lb (97.5 kg)    BMI 33.67 kg/m       Objective:   Physical Exam Vitals signs and nursing note reviewed.  Constitutional:      Appearance: Normal appearance.  HENT:     Head: Normocephalic.     Right Ear: Tympanic membrane, ear canal and external ear normal. There is no impacted cerumen.     Left Ear: Tympanic membrane, ear canal and external ear normal. There is no impacted cerumen.     Nose: Nose normal.     Mouth/Throat:     Mouth: Mucous membranes are moist.     Pharynx: Oropharynx is clear.  Cardiovascular:     Rate and Rhythm: Normal rate and regular rhythm.     Pulses: Normal pulses.     Heart sounds: Normal heart sounds. No murmur. No friction rub. No gallop.   Pulmonary:     Effort: Pulmonary effort is normal. No respiratory distress.     Breath sounds: Normal breath sounds. No stridor. No wheezing, rhonchi or rales.  Chest:     Chest wall: No tenderness.  Abdominal:     General: Abdomen is flat. Bowel sounds are normal. There is no distension.     Palpations: Abdomen is soft. There is no mass.     Tenderness: There is no abdominal tenderness. There is no guarding.     Hernia: No hernia is present.  Musculoskeletal: Normal range of motion.        General: No swelling, tenderness, deformity or signs of injury.     Right lower leg: No edema.  Skin:    General: Skin is warm and dry.     Capillary Refill: Capillary refill takes less than 2 seconds.      Coloration: Skin is not jaundiced or pale.     Findings: No bruising, erythema or rash.  Neurological:     General: No focal deficit present.     Mental Status: He is alert and oriented to person, place, and time.     Motor: Tremor (Resting tremor R>L. No cogwheel) present. No weakness or abnormal muscle tone.  Psychiatric:        Mood and Affect: Mood normal.        Behavior: Behavior normal.        Thought Content: Thought content normal.        Judgment: Judgment normal.       Assessment & Plan:  1. Routine general medical examination at a health care facility  - CBC with Differential/Platelet - Comprehensive metabolic panel - Hemoglobin A1c - Lipid panel - TSH  2. Dyslipidemia - Continues with statin and asa - CBC with Differential/Platelet - Comprehensive metabolic panel - Hemoglobin A1c - Lipid panel - TSH  3. Coronary artery disease involving native coronary artery of native heart without angina pectoris - Continues with  statin and asa.  - Follow up with Cardiology as directed  - CBC with Differential/Platelet - Comprehensive metabolic panel - Hemoglobin A1c - Lipid panel - TSH  4. AV BLOCK, COMPLETE - Follow up with Cardiology as directed   5. Essential hypertension - No change in medications  - CBC with Differential/Platelet - Comprehensive metabolic panel - Hemoglobin A1c - Lipid panel - TSH  6. Diabetes mellitus without complication ([place]) - Consider adding agent  - Follow up in 3 months  - CBC with Differential/Platelet - Comprehensive metabolic panel - Hemoglobin A1c - Lipid panel - TSH  7. Primary osteoarthritis of both knees - Follow up with orthopedics as needed  8. Prostate cancer screening - PSA  9. Colon cancer screening  - Ambulatory referral to Gastroenterology  10. Resting tremor - We discussed seeing Neurology but he refused at this time   [name], NP

## 2018-12-29 ENCOUNTER — Encounter: Payer: Medicare HMO | Admitting: Adult Health

## 2019-01-01 ENCOUNTER — Telehealth: Payer: Self-pay

## 2019-01-01 NOTE — Telephone Encounter (Signed)

## 2019-01-05 ENCOUNTER — Ambulatory Visit (INDEPENDENT_AMBULATORY_CARE_PROVIDER_SITE_OTHER): Payer: Medicare HMO | Admitting: *Deleted

## 2019-01-05 ENCOUNTER — Other Ambulatory Visit: Payer: Self-pay

## 2019-01-05 DIAGNOSIS — Z5181 Encounter for therapeutic drug level monitoring: Secondary | ICD-10-CM | POA: Diagnosis not present

## 2019-01-05 DIAGNOSIS — Z952 Presence of prosthetic heart valve: Secondary | ICD-10-CM | POA: Diagnosis not present

## 2019-01-05 DIAGNOSIS — Z09 Encounter for follow-up examination after completed treatment for conditions other than malignant neoplasm: Secondary | ICD-10-CM

## 2019-01-05 LAB — POCT INR: INR: 2.9 (ref 2.0–3.0)

## 2019-01-05 NOTE — Patient Instructions (Addendum)
Description   Continue taking the dose you have been taking which is 1 tablet every day. Recheck in 4 weeks. Call with any changes. # [phone] [id number].

## 2019-01-18 ENCOUNTER — Telehealth: Payer: Self-pay

## 2019-01-18 ENCOUNTER — Encounter: Payer: Self-pay | Admitting: Internal Medicine

## 2019-01-18 ENCOUNTER — Ambulatory Visit (INDEPENDENT_AMBULATORY_CARE_PROVIDER_SITE_OTHER): Payer: Medicare HMO | Admitting: Internal Medicine

## 2019-01-18 ENCOUNTER — Other Ambulatory Visit: Payer: Self-pay

## 2019-01-18 VITALS — Ht 67.75 in | Wt 211.0 lb

## 2019-01-18 DIAGNOSIS — Z7901 Long term (current) use of anticoagulants: Secondary | ICD-10-CM

## 2019-01-18 DIAGNOSIS — Z952 Presence of prosthetic heart valve: Secondary | ICD-10-CM | POA: Diagnosis not present

## 2019-01-18 DIAGNOSIS — I350 Nonrheumatic aortic (valve) stenosis: Secondary | ICD-10-CM | POA: Diagnosis not present

## 2019-01-18 DIAGNOSIS — Z1211 Encounter for screening for malignant neoplasm of colon: Secondary | ICD-10-CM

## 2019-01-18 NOTE — Telephone Encounter (Signed)
[facility] Pre-operative Risk Assessment     Request for surgical clearance:     Endoscopy Procedure  What type of surgery is being performed?     colonoscopy  When is this surgery scheduled?     02/14/2019  What type of clearance is required ?   Pharmacy  Are there any medications that need to be held prior to surgery and how long? Warfarin , 5 days  Practice name and name of physician performing surgery?      [facility]  What is your office phone and fax number?      Phone- [phone]  [phone]  Anesthesia type (None, local, MAC, general) ?       MAC

## 2019-01-18 NOTE — Telephone Encounter (Signed)
PharmD to review coumadin. Note, patient has a mechanical aortic valve, CAD s/p CABG, CHB s/p PPM. Last note by Kathyrn Drown on 08/28/2018 mentioned Lovenox bridge prior to previous surgery.

## 2019-01-18 NOTE — Progress Notes (Signed)
TELEHEALTH ENCOUNTER IN SETTING OF COVID-19 PANDEMIC - REQUESTED BY PATIENT SERVICE PROVIDED BY TELEMEDECINE - TYPE: telephone PATIENT LOCATION: Home PATIENT HAS CONSENTED TO TELEHEALTH VISIT PROVIDER LOCATION: OFFICE REFERRING PROVIDER:N/A PARTICIPANTS OTHER THAN PATIENT:none TIME SPENT ON CALL:5 mins   [name] 73 y.o. 08-20-1945 [id number]  Assessment & Plan:   Encounter Diagnoses  Name Primary?   Colon cancer screening Yes   Warfarin anticoagulation    Aortic valve stenosis, etiology of cardiac valve disease unspecified    Aortic valve replaced     We have decided to proceed with a screening colonoscopy.  We have discussed the other screening options that include Cologuard and Hemoccults though I find those less favorable given his chronic warfarin therapy and I think an increased false positive test risk.  He also has hemorrhoids that have bled in the past.  He understands the rare but real risk of stroke off anticoagulation therapy.  Anticipate holding warfarin 5 days prior to colonoscopy.  We will clarify with cardiology, Dr. Marlou Porch.  He also understands that it is possible he could contract the coronavirus through his colonoscopy procedure and exposure to other people though we are following recommendations to reduce that risk.   The risks and benefits as well as alternatives of endoscopic procedure(s) have been discussed and reviewed. All questions answered. The patient agrees to proceed.   I appreciate the opportunity to care for this patient. Cc: [name], NP   Subjective:   Chief Complaint: Colon cancer screening on warfarin  HPI The patient was last seen in 2018 at which point we were going to proceed with a screening colonoscopy but he ended up having lumbar back surgery and it was not done.  That was somewhat successful he said.  He also has an aortic valve replacement and is on warfarin therapy also status post coronary artery bypass  grafting and pacemaker insertion.  He is followed by Dr. Marlou Porch and Dr. Lovena Le.  He says he has regular defecation every 2 days.  No bowel complaints though he does have hemorrhoids that can be symptomatic at times. Allergies  Allergen Reactions   Bee Venom Shortness Of Breath and Swelling   Codeine Phosphate Hives, Shortness Of Breath, Itching, Rash and Other (See Comments)    Tolerated multiple doses of IV morphine   Metoprolol Tartrate Other (See Comments)    loss of vision. Can take the XL tablet not regular    Tramadol     Pt has hallucinations    Ramipril Rash and Cough   Current Meds  Medication Sig   ACCU-CHEK FASTCLIX LANCETS MISC USE TO CHECK BLOOD SUGAR TWICE A DAY AND AS NEEDED   acetaminophen (TYLENOL ARTHRITIS PAIN) 650 MG CR tablet Take 1,300 mg by mouth 2 (two) times daily.   albuterol (PROVENTIL HFA;VENTOLIN HFA) 108 (90 Base) MCG/ACT inhaler Inhale 2 puffs into the lungs every 6 (six) hours as needed for wheezing or shortness of breath.   Ascorbic Acid (VITAMIN C) 500 MG tablet Take 1,000 mg by mouth daily.    aspirin 81 MG tablet Take 81 mg by mouth every evening.    atorvastatin (LIPITOR) 40 MG tablet TAKE 1 TABLET EVERY DAY AT 6 PM   calcium carbonate (OS-CAL) 600 MG TABS Take 600 mg by mouth 2 (two) times daily with a meal.   cetirizine (ZYRTEC) 10 MG tablet Take 10 mg by mouth daily.    fluticasone (FLONASE) 50 MCG/ACT nasal spray Place 2 sprays into both  nostrils daily.   furosemide (LASIX) 40 MG tablet Take 1 tablet (40 mg total) by mouth 2 (two) times daily.   Glucosamine-Chondroitin 250-200 MG CAPS Take 2 capsules by mouth daily.    glucose blood (ACCU-CHEK SMARTVIEW) test strip USE TO CHECK BLOOD SUGAR TWICE A DAY AND PRN   guaiFENesin-dextromethorphan (ROBITUSSIN DM) 100-10 MG/5ML syrup Take 5 mLs by mouth every 4 (four) hours as needed for cough.   losartan (COZAAR) 100 MG tablet Take 1 tablet (100 mg total) by mouth daily.   metFORMIN  (GLUCOPHAGE) 1000 MG tablet Take 1 tablet (1,000 mg total) by mouth 2 (two) times daily with a meal.   metoprolol succinate (TOPROL-XL) 100 MG 24 hr tablet TAKE 1 TABLET EVERY DAY   Misc Natural Products (TART CHERRY ADVANCED) CAPS Take 2 capsules by mouth daily.    Multiple Vitamin (MULTIVITAMIN) tablet Take 1 tablet by mouth daily.     nitroGLYCERIN (NITROSTAT) 0.4 MG SL tablet Place 1 tablet (0.4 mg total) under the tongue every 5 (five) minutes as needed. For chest pain   Omega-3 Fatty Acids (FISH OIL) 1200 MG CAPS Take 1,200 mg by mouth daily.    potassium chloride (K-DUR) 10 MEQ tablet TAKE 1 TABLET TWICE DAILY   Probiotic Product (PHILLIPS COLON HEALTH PO) Take 1 capsule by mouth daily.   vitamin B-12 (CYANOCOBALAMIN) 1000 MCG tablet Take 1,000 mcg by mouth daily.   warfarin (COUMADIN) 10 MG tablet TAKE 1 TABLET DAILY OR AS DIRECTED BY COUMADIN CLINIC   Past Medical History:  Diagnosis Date   Acute renal failure ([place]) 02/09/2013   Aortic stenosis 08/25/2010   a. Bentall aortic root replacement with a St. Jude mechanical valve and Hemashield conduit 02/2004.   Arthritis    Asthma    AV BLOCK, COMPLETE    a. s/p St Jude dual chamber pacemaker 02/2004.   CAD (coronary artery disease)    a. s/p CABGx2 (LIMA-dLAD, SVG-Cx). b. Low risk nuc 12/2012 without ischemia, EF 46% mild apical hypokinesia (EF 55% inf HK by echo).   Carotid artery disease ([facility])    a. 0-39% bilateral ICA stenosis, stable mild hard plaque in carotid bulbs. F/u 03/2014 recommended.   Diabetes mellitus without complication ([place])    type 2    DIVERTICULOSIS, COLON 10/17/2007   Ejection fraction    a. EF 55% with inf HK, mild MR by echo 12/2012.   GERD (gastroesophageal reflux disease)    hxof    GOUT 01/16/2007   Headache    hx of migraines    HEMORRHOIDS, INTERNAL 11/01/2008   HYPERLIPIDEMIA 01/16/2007   HYPERTENSION 01/16/2007   Impaired glucose tolerance    LEG EDEMA 11/27/2008   Special  screening for malignant neoplasm of prostate    Warfarin anticoagulation    AVR   Past Surgical History:  Procedure Laterality Date   AORTIC VALVE REPLACEMENT     CARDIAC CATHETERIZATION  06/2003   CIRCUMCISION     COLONOSCOPY     CORONARY ARTERY BYPASS GRAFT     coronary stents      prior to bypass    EP IMPLANTABLE DEVICE N/A 05/06/2016   Procedure: PPM Generator Changeout;  Surgeon: Evans Lance, MD;  Location: [facility];  Service: Cardiovascular;  Laterality: N/A;   ESOPHAGOGASTRODUODENOSCOPY     HERNIA REPAIR     INGUINAL HERNIA REPAIR Bilateral 02/07/2013   Procedure: LAPAROSCOPIC BILATERAL INGUINAL HERNIA REPAIR;  Surgeon: Imogene Burn. Tsuei, MD;  Location: [facility];  Service: General;  Laterality: Bilateral;   INSERTION OF MESH Bilateral 02/07/2013   Procedure: INSERTION OF MESH;  Surgeon: Imogene Burn. Georgette Dover, MD;  Location: [facility];  Service: General;  Laterality: Bilateral;   LUMBAR LAMINECTOMY/DECOMPRESSION MICRODISCECTOMY N/A 03/15/2018   Procedure: Complete decompressive lumbar laminectomy for spinal stenosis of L4-L5 and foraminotomy for L4-L5 root on right;  Surgeon: [name], MD;  Location: [facility];  Service: Orthopedics;  Laterality: N/A;   PACEMAKER INSERTION     SHOULDER SURGERY Bilateral    Social History   Social History Narrative   The patient is married and retired   He reports 3 grown children   Former smoker, no alcohol or tobacco or drug use at this time   family history includes Diabetes in his brother, brother, mother, and sister; Epilepsy in his sister; Heart attack in his father; Hypertension in his mother; Kidney cancer in his father.   Review of Systems As per HPI.  Still has some back pain issues.

## 2019-01-18 NOTE — Patient Instructions (Signed)
It was good to speak to you today.  As we discussed we will go ahead with setting up a preventive or screening colonoscopy.   Since you take warfarin, that increases the risk of bleeding if we do not temporarily stop that prior to the procedure so we will anticipate having you stop that 5 days prior to your colonoscopy.  We will clarify with Dr. Marlou Porch of cardiology and his team members regarding this to make sure that is acceptable.   We will prescribe the MiraLAX prep for you.  In addition I would like you to take 2 Dulcolax laxatives the day before your prep.  You can take those in the afternoon.  My medical assistant will explain that further.  I appreciate the opportunity to care for you Gatha Mayer, MD, [facility]

## 2019-01-19 NOTE — Telephone Encounter (Signed)
Wife [name] informed of the holding directions for her husband's warfarin. He is hard of hearing per the wife so she helps him out. She verbalized understanding.

## 2019-01-19 NOTE — Telephone Encounter (Addendum)
Patient with diagnosis of mechanical aortic valve (st judes) on warfarin for anticoagulation.    Procedure: colonoscopy Date of procedure: 02/14/2019  Per office protocol, patient can hold warfarin for 5 days prior to procedure.   Patient will NOT need lovenox bridge He has a history of bleeding complications after a hernia repair with lovenox per note from Dr. Marlou Porch on 01/27/18

## 2019-01-31 ENCOUNTER — Telehealth: Payer: Self-pay

## 2019-01-31 NOTE — Telephone Encounter (Signed)

## 2019-01-31 NOTE — Telephone Encounter (Signed)
LMOM FOR PRESCREEN  

## 2019-02-02 ENCOUNTER — Other Ambulatory Visit: Payer: Self-pay

## 2019-02-02 ENCOUNTER — Ambulatory Visit (INDEPENDENT_AMBULATORY_CARE_PROVIDER_SITE_OTHER): Payer: Medicare HMO | Admitting: *Deleted

## 2019-02-02 DIAGNOSIS — Z952 Presence of prosthetic heart valve: Secondary | ICD-10-CM

## 2019-02-02 DIAGNOSIS — Z09 Encounter for follow-up examination after completed treatment for conditions other than malignant neoplasm: Secondary | ICD-10-CM

## 2019-02-02 DIAGNOSIS — Z5181 Encounter for therapeutic drug level monitoring: Secondary | ICD-10-CM

## 2019-02-02 LAB — POCT INR: INR: 2.9 (ref 2.0–3.0)

## 2019-02-02 NOTE — Patient Instructions (Addendum)
Description   Continue taking 1 tablet every day. Recheck in 1 week after procedure. Call with any changes. # [phone] [phone].     Take your last dose of Coumadin on 02/08/2019 for procedure on 02/14/2019. When you resume your Coumadin take an extra 1/2 tablet (15mg ) for 2 days then resume taking 1 tablet (10mg ) daily until next appt.

## 2019-02-07 ENCOUNTER — Ambulatory Visit (INDEPENDENT_AMBULATORY_CARE_PROVIDER_SITE_OTHER): Payer: Medicare HMO | Admitting: *Deleted

## 2019-02-07 DIAGNOSIS — I442 Atrioventricular block, complete: Secondary | ICD-10-CM | POA: Diagnosis not present

## 2019-02-07 LAB — CUP PACEART REMOTE DEVICE CHECK
Battery Remaining Longevity: 128 mo
Battery Remaining Percentage: 95.5 %
Battery Voltage: 2.99 V
Brady Statistic AP VP Percent: 27 %
Brady Statistic AP VS Percent: 1 %
Brady Statistic AS VP Percent: 73 %
Brady Statistic AS VS Percent: 1 %
Brady Statistic RA Percent Paced: 26 %
Brady Statistic RV Percent Paced: 99 %
Date Time Interrogation Session: 20200729060019
Implantable Lead Implant Date: 20050816
Implantable Lead Implant Date: 20050816
Implantable Lead Location: 753859
Implantable Lead Location: 753860
Implantable Pulse Generator Implant Date: 20171026
Lead Channel Impedance Value: 490 Ohm
Lead Channel Impedance Value: 760 Ohm
Lead Channel Pacing Threshold Amplitude: 0.5 V
Lead Channel Pacing Threshold Amplitude: 1 V
Lead Channel Pacing Threshold Pulse Width: 0.5 ms
Lead Channel Pacing Threshold Pulse Width: 0.5 ms
Lead Channel Sensing Intrinsic Amplitude: 12 mV
Lead Channel Sensing Intrinsic Amplitude: 2.6 mV
Lead Channel Setting Pacing Amplitude: 0.75 V
Lead Channel Setting Pacing Amplitude: 2 V
Lead Channel Setting Pacing Pulse Width: 0.5 ms
Lead Channel Setting Sensing Sensitivity: 4 mV
Pulse Gen Model: 2272
Pulse Gen Serial Number: 3182792

## 2019-02-10 DIAGNOSIS — Z860101 Personal history of adenomatous and serrated colon polyps: Secondary | ICD-10-CM

## 2019-02-10 DIAGNOSIS — Z86010 Personal history of colonic polyps: Secondary | ICD-10-CM

## 2019-02-10 HISTORY — DX: Personal history of adenomatous and serrated colon polyps: Z86.0101

## 2019-02-10 HISTORY — DX: Personal history of colonic polyps: Z86.010

## 2019-02-13 ENCOUNTER — Telehealth: Payer: Self-pay | Admitting: Internal Medicine

## 2019-02-13 NOTE — Telephone Encounter (Signed)

## 2019-02-14 ENCOUNTER — Ambulatory Visit (AMBULATORY_SURGERY_CENTER): Payer: Medicare HMO | Admitting: Internal Medicine

## 2019-02-14 ENCOUNTER — Other Ambulatory Visit: Payer: Self-pay

## 2019-02-14 ENCOUNTER — Encounter: Payer: Self-pay | Admitting: Internal Medicine

## 2019-02-14 VITALS — BP 124/68 | HR 71 | Temp 98.3°F | Resp 19 | Ht 67.5 in | Wt 211.0 lb

## 2019-02-14 DIAGNOSIS — Z1211 Encounter for screening for malignant neoplasm of colon: Secondary | ICD-10-CM

## 2019-02-14 DIAGNOSIS — K5140 Inflammatory polyps of colon without complications: Secondary | ICD-10-CM | POA: Diagnosis not present

## 2019-02-14 DIAGNOSIS — D128 Benign neoplasm of rectum: Secondary | ICD-10-CM

## 2019-02-14 DIAGNOSIS — D124 Benign neoplasm of descending colon: Secondary | ICD-10-CM

## 2019-02-14 MED ORDER — SODIUM CHLORIDE 0.9 % IV SOLN: 500.0000 mL | Freq: Once | INTRAVENOUS | Status: DC

## 2019-02-14 NOTE — Progress Notes (Signed)
Temp taken by WR VS taken by CW 

## 2019-02-14 NOTE — Patient Instructions (Addendum)
I found and removed 2 tiny polyps.  Based upon these findings and your age I suspect this will be your last routine colonoscopy.  I will let you know after I see the pathology results.  Please restart warfarin tonight - as instructed by the anti-coagulation clinic,  then resume normal dosing and follow-up with anti-coagulation clinic as planned  I appreciate the opportunity to care for you. Gatha Mayer, MD, FACG   YOU HAD AN ENDOSCOPIC PROCEDURE TODAY AT [facility]:   Refer to the procedure report that was given to you for any specific questions about what was found during the examination.  If the procedure report does not answer your questions, please call your gastroenterologist to clarify.  If you requested that your care partner not be given the details of your procedure findings, then the procedure report has been included in a sealed envelope for you to review at your convenience later.  YOU SHOULD EXPECT: Some feelings of bloating in the abdomen. Passage of more gas than usual.  Walking can help get rid of the air that was put into your GI tract during the procedure and reduce the bloating. If you had a lower endoscopy (such as a colonoscopy or flexible sigmoidoscopy) you may notice spotting of blood in your stool or on the toilet paper. If you underwent a bowel prep for your procedure, you may not have a normal bowel movement for a few days.  Please Note:  You might notice some irritation and congestion in your nose or some drainage.  This is from the oxygen used during your procedure.  There is no need for concern and it should clear up in a day or so.  SYMPTOMS TO REPORT IMMEDIATELY:   Following lower endoscopy (colonoscopy or flexible sigmoidoscopy):  Excessive amounts of blood in the stool  Significant tenderness or worsening of abdominal pains  Swelling of the abdomen that is new, acute  Fever of 100F or higher    For urgent or emergent  issues, a gastroenterologist can be reached at any hour by calling [phone].   DIET:  We do recommend a small meal at first, but then you may proceed to your regular diet.  Drink plenty of fluids but you should avoid alcoholic beverages for 24 hours.  ACTIVITY:  You should plan to take it easy for the rest of today and you should NOT DRIVE or use heavy machinery until tomorrow (because of the sedation medicines used during the test).    FOLLOW UP: Our staff will call the number listed on your records 48-72 hours following your procedure to check on you and address any questions or concerns that you may have regarding the information given to you following your procedure. If we do not reach you, we will leave a message.  We will attempt to reach you two times.  During this call, we will ask if you have developed any symptoms of COVID 19. If you develop any symptoms (ie: fever, flu-like symptoms, shortness of breath, cough etc.) before then, please call [phone].  If you test positive for Covid 19 in the 2 weeks post procedure, please call and report this information to [place].    If any biopsies were taken you will be contacted by phone or by letter within the next 1-3 weeks.  Please call us at [phone] if you have not heard about the biopsies in 3 weeks.    SIGNATURES/CONFIDENTIALITY: You and/or your care partner  have signed paperwork which will be entered into your electronic medical record.  These signatures attest to the fact that that the information above on your After Visit Summary has been reviewed and is understood.  Full responsibility of the confidentiality of this discharge information lies with you and/or your care-partner.

## 2019-02-14 NOTE — Op Note (Signed)
[place] Patient Name: [name] Procedure Date: 02/14/2019 11:17 AM MRN: [id number] Endoscopist: Gatha Mayer , MD Age: 73 Referring MD:  Date of Birth: 1946/05/05 Gender: Male Account #: [id number] Procedure:                Colonoscopy Indications:              Screening for colorectal malignant neoplasm Medicines:                Propofol per Anesthesia, Monitored Anesthesia Care Procedure:                Pre-Anesthesia Assessment:                           - Prior to the procedure, a History and Physical                            was performed, and patient medications and                            allergies were reviewed. The patient's tolerance of                            previous anesthesia was also reviewed. The risks                            and benefits of the procedure and the sedation                            options and risks were discussed with the patient.                            All questions were answered, and informed consent                            was obtained. Prior Anticoagulants: The patient                            last took Coumadin (warfarin) 5 days prior to the                            procedure. ASA Grade Assessment: III - A patient                            with severe systemic disease. After reviewing the                            risks and benefits, the patient was deemed in                            satisfactory condition to undergo the procedure.                           After obtaining informed consent, the colonoscope  was passed under direct vision. Throughout the                            procedure, the patient's blood pressure, pulse, and                            oxygen saturations were monitored continuously. The                            Colonoscope was introduced through the anus and                            advanced to the the cecum, identified by   appendiceal orifice and ileocecal valve. The                            colonoscopy was performed without difficulty. The                            patient tolerated the procedure well. The quality                            of the bowel preparation was good. The bowel                            preparation used was Miralax via split dose                            instruction. The ileocecal valve, appendiceal                            orifice, and rectum were photographed. Scope In: 11:26:42 AM Scope Out: 11:43:26 AM Scope Withdrawal Time: 0 hours 11 minutes 57 seconds  Total Procedure Duration: 0 hours 16 minutes 44 seconds  Findings:                 A diminutive polyp was found in the proximal                            descending colon. The polyp was sessile. The polyp                            was removed with a cold snare. Resection and                            retrieval were complete. Verification of patient                            identification for the specimen was done. Estimated                            blood loss was minimal.                           A 1 to  2 mm polyp was found in the rectum. The                            polyp was sessile. The polyp was removed with a                            cold biopsy forceps. Resection and retrieval were                            complete. Verification of patient identification                            for the specimen was done. Estimated blood loss was                            minimal.                           The exam was otherwise without abnormality on                            direct and retroflexion views.                           The perianal exam findings include a perianal rash.                           The digital rectal exam was normal. Pertinent                            negatives include normal prostate (size, shape, and                            consistency). Complications:            No immediate  complications. Estimated Blood Loss:     Estimated blood loss was minimal. Impression:               - One diminutive polyp in the proximal descending                            colon, removed with a cold snare. Resected and                            retrieved.                           - One 1 to 2 mm polyp in the rectum, removed with a                            cold biopsy forceps. Resected and retrieved.                           - The examination was otherwise normal on direct  and retroflexion views.                           - Perianal rash found on perianal exam. Recommendation:           - Patient has a contact number available for                            emergencies. The signs and symptoms of potential                            delayed complications were discussed with the                            patient. Return to normal activities tomorrow.                            Written discharge instructions were provided to the                            patient.                           - Resume previous diet.                           - Continue present medications.                           - Resume Coumadin (warfarin) at prior dose today.                           - No repeat colonoscopy due to age. Gatha Mayer, MD 02/14/2019 11:57:08 AM This report has been signed electronically.

## 2019-02-14 NOTE — Progress Notes (Signed)
Called to room to assist during endoscopic procedure.  Patient ID and intended procedure confirmed with present staff. Received instructions for my participation in the procedure from the performing physician.  

## 2019-02-14 NOTE — Progress Notes (Signed)
To PACU, VSS. Report to [email or web address] 

## 2019-02-15 NOTE — Progress Notes (Signed)
Remote pacemaker transmission.   

## 2019-02-16 ENCOUNTER — Telehealth: Payer: Self-pay

## 2019-02-16 NOTE — Telephone Encounter (Signed)
Left voice mail

## 2019-02-16 NOTE — Telephone Encounter (Signed)
  Follow up Call-  Call back number 02/14/2019  Post procedure Call Back phone  # [phone]  Permission to leave phone message Yes  Some recent data might be hidden     Patient questions:  Do you have a fever, pain , or abdominal swelling? No. Pain Score  0 *  Have you tolerated food without any problems? Yes.    Have you been able to return to your normal activities? Yes.    Do you have any questions about your discharge instructions: Diet   No. Medications  No. Follow up visit  No.  Do you have questions or concerns about your Care? No.  Actions: * If pain score is 4 or above: No action needed, pain <4. 1. Have you developed a fever since your procedure? no  2.   Have you had an respiratory symptoms (SOB or cough) since your procedure? no  3.   Have you tested positive for COVID 19 since your procedure no  4.   Have you had any family members/close contacts diagnosed with the COVID 19 since your procedure?  no   If yes to any of these questions please route to Joylene John, RN and Alphonsa Gin, Therapist, sports.

## 2019-02-18 ENCOUNTER — Encounter: Payer: Self-pay | Admitting: Internal Medicine

## 2019-02-22 ENCOUNTER — Ambulatory Visit (INDEPENDENT_AMBULATORY_CARE_PROVIDER_SITE_OTHER): Payer: Medicare HMO | Admitting: Pharmacist

## 2019-02-22 ENCOUNTER — Other Ambulatory Visit: Payer: Self-pay

## 2019-02-22 DIAGNOSIS — Z09 Encounter for follow-up examination after completed treatment for conditions other than malignant neoplasm: Secondary | ICD-10-CM | POA: Diagnosis not present

## 2019-02-22 DIAGNOSIS — Z952 Presence of prosthetic heart valve: Secondary | ICD-10-CM | POA: Diagnosis not present

## 2019-02-22 DIAGNOSIS — Z5181 Encounter for therapeutic drug level monitoring: Secondary | ICD-10-CM | POA: Diagnosis not present

## 2019-02-22 LAB — POCT INR: INR: 2.3 (ref 2.0–3.0)

## 2019-02-22 NOTE — Patient Instructions (Signed)
Description   Continue taking 1 tablet every day. Recheck in 8 weeks. Call with any changes. # [phone].

## 2019-02-28 DIAGNOSIS — G252 Other specified forms of tremor: Secondary | ICD-10-CM | POA: Diagnosis not present

## 2019-02-28 DIAGNOSIS — M13862 Other specified arthritis, left knee: Secondary | ICD-10-CM | POA: Diagnosis not present

## 2019-02-28 DIAGNOSIS — M13861 Other specified arthritis, right knee: Secondary | ICD-10-CM | POA: Diagnosis not present

## 2019-02-28 DIAGNOSIS — M25561 Pain in right knee: Secondary | ICD-10-CM | POA: Diagnosis not present

## 2019-02-28 DIAGNOSIS — M25562 Pain in left knee: Secondary | ICD-10-CM | POA: Diagnosis not present

## 2019-03-02 ENCOUNTER — Encounter: Payer: Self-pay | Admitting: Adult Health

## 2019-03-02 ENCOUNTER — Other Ambulatory Visit: Payer: Self-pay | Admitting: Adult Health

## 2019-03-02 ENCOUNTER — Other Ambulatory Visit: Payer: Self-pay | Admitting: Internal Medicine

## 2019-03-06 MED ORDER — POTASSIUM CHLORIDE CRYS ER 10 MEQ PO TBCR: 10.0000 meq | EXTENDED_RELEASE_TABLET | Freq: Two times a day (BID) | ORAL | 3 refills | Status: DC

## 2019-03-06 NOTE — Telephone Encounter (Signed)
Sent to the pharmacy by e-scribe. 

## 2019-03-07 ENCOUNTER — Other Ambulatory Visit: Payer: Self-pay

## 2019-03-07 ENCOUNTER — Ambulatory Visit (INDEPENDENT_AMBULATORY_CARE_PROVIDER_SITE_OTHER): Payer: Medicare HMO | Admitting: Neurology

## 2019-03-07 ENCOUNTER — Encounter: Payer: Self-pay | Admitting: Neurology

## 2019-03-07 VITALS — BP 152/85 | HR 82 | Ht 67.0 in | Wt 215.0 lb

## 2019-03-07 DIAGNOSIS — G20 Parkinson's disease: Secondary | ICD-10-CM

## 2019-03-07 DIAGNOSIS — K5900 Constipation, unspecified: Secondary | ICD-10-CM

## 2019-03-07 DIAGNOSIS — Z9181 History of falling: Secondary | ICD-10-CM | POA: Diagnosis not present

## 2019-03-07 DIAGNOSIS — G4719 Other hypersomnia: Secondary | ICD-10-CM

## 2019-03-07 MED ORDER — CARBIDOPA-LEVODOPA 25-100 MG PO TABS: ORAL_TABLET | ORAL | 5 refills | Status: DC

## 2019-03-07 NOTE — Patient Instructions (Signed)
I believe you have Parkinson's disease, affecting your right more than left side. This condition does progress with time. It can affect your balance, your memory, your mood, your bowel and bladder function, your posture, balance and walking and your activities of daily living. However, there are good supportive treatments and symptomatic treatments available, so most patients have a change to a good quality life and life expectancy is not typically altered. Overall you are doing fairly well but I do want to suggest a few things today:  Remember to drink plenty of fluid at least 6 glasses (8 oz each), eat healthy meals and do not skip any meals. Try to eat protein with a every meal and eat a healthy snack such as fruit or nuts in between meals. Try to keep a regular sleep-wake schedule and try to exercise daily, particularly in the form of walking, 20-30 minutes a day, if you can.   We will start medication: Sinemet (generic name: carbidopa-levodopa) 25/100 mg: Take half a pill twice daily (8 AM and noon) for one week, then half a pill 3 times a day (8 AM, noon, and 4 PM) for one week, then one pill 3 times a day thereafter. Please try to take the medication away from you mealtimes, that is, ideally either one hour before or 2 hours after your meal to ensure optimal absorption. The medication can interfere with the protein content of your meal and trying to the protein in your food and therefore not get fully absorbed.  Common side effects reported are: Nausea, vomiting, sedation, confusion, lightheadedness. Rare side effects include hallucinations, severe nausea or vomiting, diarrhea and significant drop in blood pressure especially when going from lying to standing or from sitting to standing.   Please remember, no medication is without potential side effects and not every medication is right for every patient with PD.   Try to stay active physically and mentally. I would recommend you start using your  walker because your balance is impaired. In addition, you have significant knee pain.  Please be really proactive About constipation issues, use over the counter meds and supplements, titrating as needed to where you have a formed stool at least every other day. You can use over-the-counter probiotic yogurt or pills, add fiber in the form of Metamucil, and use a stool softener or if needed a laxative, even daily if necessary.   Follow up in 3 months.  We may consider a sleep study down the road as you are sleepy during the day.   For any emergencies you know to call 911 or go to the nearest emergency room.

## 2019-03-07 NOTE — Progress Notes (Signed)
Subjective:    Patient ID: [name] is a 73 y.o. male.  HPI     Star Age, MD, PhD [facility] [address], [address] [place], [place] [address]  Dear Luetta Nutting,  I saw your patient, [name], upon your kind request to my neurologic clinic today for initial consultation of his tremor, concern for parkinsonism.  The patient is unaccompanied today.  As you know, [name] is a 73 year old right-handed gentleman with an underlying complex medical history of hypertension, hyperlipidemia, gout, diabetes, coronary artery disease, history of mitral valve regurgitation, aortic stenosis, carotid artery disease, spinal stenosis of the lumbar spine, status post lumbar spine surgery in September 2019 under Dr. Melvyn Novas, history of aortic valve replacement, status post cardiac pacemaker placement, status post CABG, knee arthritis bilaterally, status post lumbar laminectomy, and obesity, who reports a b/l hand tremor for the past year, started after his back surgery and first noticed in the right hand.  He has had some balance issues as well, and further asking them, it is possible that he has had balance problems for the past 3+ years, maybe even 5 years.  His wife has noted a gradual slowing down and difficulty with fine motor skills.  He reports a family history of Parkinson's disease.  His brother died at 72 and had Parkinson's disease as well as complications of diabetes does understand.  He is 1 of a total of 13 children, this includes two stepchildren, one half brother and 3 half-sisters he reports.  No other sibling with Parkinson's disease.  He reports a fall about 2 weeks ago, he feels like he tripped over his own feet.  He fell on his left side and bruised his face but also his left rib cage and left knee.  He did not seek medical attention at the time.  He has ongoing issues with bilateral knee pain, his right ankle is swollen, he has arthritis in both hands  as well.  He has never seen a rheumatologist.  He sleeps in a recliner.  His wife does report that he is sleepy during the day.  He snores a little bit according to her.  She also endorses that he has significant constipation issues, may go 2 or even 3 days without a bowel movement.  He has been trying to eat prunes every day which has helped and she is giving him Dulcolax as needed.  He tries to hydrate well with water. I reviewed your office note from 02/28/2019.  He has received bilateral cortisone injections into the knees.  He has had falls.  He has had some gait changes including shuffling.  You noticed a tremor.  He had repeat knee steroid injections on 02/28/2019. He has a cane and a walker but does not typically use any 1 of them on a consistent basis.  He did not bring a walking aid today.  His Past Medical History Is Significant For: Past Medical History:  Diagnosis Date  . Acute renal failure ([place]) 02/09/2013  . Aortic stenosis 08/25/2010   a. [place] aortic root replacement with a St. Jude mechanical valve and Hemashield conduit 02/2004.  Marland Kitchen Arthritis   . Asthma   . AV BLOCK, COMPLETE    a. s/p St Jude dual chamber pacemaker 02/2004.  Marland Kitchen CAD (coronary artery disease)    a. s/p CABGx2 (LIMA-dLAD, SVG-Cx). b. Low risk nuc 12/2012 without ischemia, EF 46% mild apical hypokinesia (EF 55% inf HK by echo).  Marland Kitchen  Carotid artery disease (HCC)    a. 0-39% bilateral ICA stenosis, stable mild hard plaque in carotid bulbs. F/u 03/2014 recommended.  . Diabetes mellitus without complication ([place])    type 2   . DIVERTICULOSIS, COLON 10/17/2007  . Ejection fraction    a. EF 55% with inf HK, mild MR by echo 12/2012.  Marland Kitchen GERD (gastroesophageal reflux disease)    hxof   . GOUT 01/16/2007  . Headache    hx of migraines   . HEMORRHOIDS, INTERNAL 11/01/2008  . Hx of adenomatous polyp of colon 02/2019   no recall needed  . HYPERLIPIDEMIA 01/16/2007  . HYPERTENSION 01/16/2007  . Impaired glucose tolerance   . LEG EDEMA  11/27/2008  . Special screening for malignant neoplasm of prostate   . Warfarin anticoagulation    AVR    His Past Surgical History Is Significant For: Past Surgical History:  Procedure Laterality Date  . AORTIC VALVE REPLACEMENT    . CARDIAC CATHETERIZATION  06/2003  . CIRCUMCISION    . COLONOSCOPY    . CORONARY ARTERY BYPASS GRAFT    . coronary stents      prior to bypass   . EP IMPLANTABLE DEVICE N/A 05/06/2016   Procedure: PPM Generator Changeout;  Surgeon: Evans Lance, MD;  Location: [facility];  Service: Cardiovascular;  Laterality: N/A;  . ESOPHAGOGASTRODUODENOSCOPY    . HERNIA REPAIR    . INGUINAL HERNIA REPAIR Bilateral 02/07/2013   Procedure: LAPAROSCOPIC BILATERAL INGUINAL HERNIA REPAIR;  Surgeon: Imogene Burn. Georgette Dover, MD;  Location: [facility];  Service: General;  Laterality: Bilateral;  . INSERTION OF MESH Bilateral 02/07/2013   Procedure: INSERTION OF MESH;  Surgeon: Imogene Burn. Georgette Dover, MD;  Location: [facility];  Service: General;  Laterality: Bilateral;  . LUMBAR LAMINECTOMY/DECOMPRESSION MICRODISCECTOMY N/A 03/15/2018   Procedure: Complete decompressive lumbar laminectomy for spinal stenosis of L4-L5 and foraminotomy for L4-L5 root on right;  Surgeon: [name], MD;  Location: [facility];  Service: Orthopedics;  Laterality: N/A;  . PACEMAKER INSERTION    . SHOULDER SURGERY Bilateral     His Family History Is Significant For: Family History  Problem Relation Age of Onset  . Heart attack Father   . Kidney cancer Father   . Hypertension Mother   . Diabetes Mother   . Diabetes Brother   . Epilepsy Sister   . Diabetes Sister   . Diabetes Brother   . Colon cancer Neg Hx   . Rectal cancer Neg Hx   . Stomach cancer Neg Hx   . Esophageal cancer Neg Hx     His Social History Is Significant For: Social History   Socioeconomic History  . Marital status: Married    Spouse name: Not on file  . Number of children: 3  . Years of education: Not on file  . Highest  education level: Not on file  Occupational History  . Occupation: retired  Scientific laboratory technician  . Financial resource strain: Not on file  . Food insecurity    Worry: Not on file    Inability: Not on file  . Transportation needs    Medical: Not on file    Non-medical: Not on file  Tobacco Use  . Smoking status: Former Smoker    Quit date: 07/12/1973    Years since quitting: 45.6  . Smokeless tobacco: Never Used  Substance and Sexual Activity  . Alcohol use: No  . Drug use: No  . Sexual activity: Not on file  Lifestyle  .  Physical activity    Days per week: Not on file    Minutes per session: Not on file  . Stress: Not on file  Relationships  . Social Herbalist on phone: Not on file    Gets together: Not on file    Attends religious service: Not on file    Active member of club or organization: Not on file    Attends meetings of clubs or organizations: Not on file    Relationship status: Not on file  Other Topics Concern  . Not on file  Social History Narrative   The patient is married and retired   He reports 3 grown children   Former smoker, no alcohol or tobacco or drug use at this time    His Allergies Are:  Allergies  Allergen Reactions  . Bee Venom Shortness Of Breath and Swelling  . Codeine Phosphate Hives, Shortness Of Breath, Itching, Rash and Other (See Comments)    Tolerated multiple doses of IV morphine  . Metoprolol Tartrate Other (See Comments)    loss of vision. Can take the XL tablet not regular   . Tramadol     Pt has hallucinations   . Ramipril Rash and Cough  :   His Current Medications Are:  Outpatient Encounter Medications as of 03/07/2019  Medication Sig  . ACCU-CHEK FASTCLIX LANCETS MISC USE TO CHECK BLOOD SUGAR TWICE A DAY AND AS NEEDED  . acetaminophen (TYLENOL ARTHRITIS PAIN) 650 MG CR tablet Take 1,300 mg by mouth 2 (two) times daily.  Marland Kitchen albuterol (PROVENTIL HFA;VENTOLIN HFA) 108 (90 Base) MCG/ACT inhaler Inhale 2 puffs into the  lungs every 6 (six) hours as needed for wheezing or shortness of breath.  . Ascorbic Acid (VITAMIN C) 500 MG tablet Take 1,000 mg by mouth daily.   Marland Kitchen aspirin 81 MG tablet Take 81 mg by mouth every evening.   Marland Kitchen atorvastatin (LIPITOR) 40 MG tablet Take 1 tablet (40 mg total) by mouth daily at 6 PM. Please make annual appt with Dr. Lovena Le for future refills. [phone].  . calcium carbonate (OS-CAL) 600 MG TABS Take 600 mg by mouth 2 (two) times daily with a meal.  . cetirizine (ZYRTEC) 10 MG tablet Take 10 mg by mouth daily.   . fluticasone (FLONASE) 50 MCG/ACT nasal spray Place 2 sprays into both nostrils daily.  . furosemide (LASIX) 40 MG tablet TAKE 1 TABLET TWICE DAILY  . Glucosamine-Chondroitin 250-200 MG CAPS Take 2 capsules by mouth daily.   Marland Kitchen glucose blood (ACCU-CHEK SMARTVIEW) test strip USE TO CHECK BLOOD SUGAR TWICE A DAY AND PRN  . guaiFENesin-dextromethorphan (ROBITUSSIN DM) 100-10 MG/5ML syrup Take 5 mLs by mouth every 4 (four) hours as needed for cough.  . losartan (COZAAR) 100 MG tablet Take 1 tablet (100 mg total) by mouth daily. Please make annual appt with Dr. Lovena Le for future refills. [phone].  . metFORMIN (GLUCOPHAGE) 1000 MG tablet Take 1 tablet (1,000 mg total) by mouth 2 (two) times daily with a meal.  . metoprolol succinate (TOPROL-XL) 100 MG 24 hr tablet TAKE 1 TABLET EVERY DAY  . Misc Natural Products (TART CHERRY ADVANCED) CAPS Take 2 capsules by mouth daily.   . Multiple Vitamin (MULTIVITAMIN) tablet Take 1 tablet by mouth daily.    . nitroGLYCERIN (NITROSTAT) 0.4 MG SL tablet Place 1 tablet (0.4 mg total) under the tongue every 5 (five) minutes as needed. For chest pain  . Omega-3 Fatty Acids (FISH OIL) 1200 MG CAPS  Take 1,200 mg by mouth daily.   . potassium chloride (K-DUR) 10 MEQ tablet Take 1 tablet (10 mEq total) by mouth 2 (two) times daily.  . Probiotic Product (PHILLIPS COLON HEALTH PO) Take 1 capsule by mouth daily.  . vitamin B-12 (CYANOCOBALAMIN)  1000 MCG tablet Take 1,000 mcg by mouth daily.  Marland Kitchen warfarin (COUMADIN) 10 MG tablet TAKE 1 TABLET DAILY OR AS DIRECTED BY COUMADIN CLINIC   No facility-administered encounter medications on file as of 03/07/2019.   :  Review of Systems:  Out of a complete 14 point review of systems, all are reviewed and negative with the exception of these symptoms as listed below:  Review of Systems  Neurological:       Pt presents today to discuss his bilateral hand tremor. Pt reports that his tremor started in his right hand almost a year ago after his back surgery. Pt feels that the tremors are worsening bilaterally. Pt is right handed.    Objective:  Neurological Exam  Physical Exam Physical Examination:   Vitals:   03/07/19 1042  BP: (!) 152/85  Pulse: 82   General Examination: The patient is a very pleasant 73 y.o. male in no acute distress. He appears well-developed and well-nourished and well groomed.   HEENT: Normocephalic, atraumatic, pupils are equal, round and reactive to light and accommodation. He has corrective eyeglasses, extraocular tracking is mildly impaired with saccadic eye movements noted, slight limitations of upgaze noted.  No nystagmus.  Face is symmetric with with moderate facial masking noted, he has moderate hypophonia.  He has Moderate nuchal rigidity.  His mouth tends to stay open involuntarily at times.  He has no obvious sialorrhea.  Tongue protrudes centrally in palate elevates symmetrically.  There is mild mouth dryness noted.  Airway examination reveals moderate airway crowding, small tonsils, questionable absent tonsils but he reports no tonsillectomy.  He has a small airway entry, thicker soft palate, large appearing uvula.Hearing is mildly impaired. No obvious lip, neck or jaw tremor, no voice tremor.  No dysarthria.   Chest: Clear to auscultation without wheezing, rhonchi or crackles noted.  Heart: S1+S2+0, regular and normal without murmurs, rubs or gallops noted.    Abdomen: Soft, non-tender and non-distended with normal bowel sounds appreciated on auscultation.  Extremities: There is 1-2+ edema in the right lower extremity, particularly around the ankle, he has 1+ or trace edema in the left leg, but also pronounced more around the.  He has right knee swelling and tenderness.  He is wearing compression socks up to knees.   Skin: Warm and dry without trophic changes noted.  Musculoskeletal: exam reveals: Significant swelling in his hands in the knuckles, particularly right middle finger, he has mild ulnar deviation on the right, arthritic changes in both hands and both knees, right knee swollen and tender.   Neurologically:  Mental status: The patient is awake, alert and oriented in all 4 spheres. His immediate and remote memory, attention, language skills and fund of knowledge are appropriate. There is no evidence of aphasia, agnosia, apraxia or anomia. Thought process is linear. Mood is normal and affect is normal.  Cranial nerves II - XII are as described above under HEENT exam. In addition: shoulder shrug is normal But at rest, right shoulder is higher than left.  On 03/07/2019: On Archimedes spiral drawing, he has insecurity with the left hand which is his nondominant hand, slight insecurity noted with the right hand, handwriting is small, legible but slightly tremulous as well.  He has a bilateral upper extremity resting tremor, right more consistent than left.  He has a slight postural tremor bilaterally, no significant action tremor, no intention tremor.  On fine motor testing, he has moderate difficulty with finger taps, hand movements and rapid alternating padding on the right side, slightly better on the left.  Foot taps and foot agility are moderately impaired on the right and slightly better on the left.  He has no lower extremity resting tremor.  He stands up with difficulty and has to push himself up, requires only 1 attempt and no assistance  needed.  He stands slightly wide-based, posture slightly stooped for age.  He walks with decreased arm swing, decreased stride length and decreased pace, arm swing nearly absent on the right side, slightly better on the left side.  His balance is impaired, noticeable with difficulty turning.  No obvious festination or freezing noted.  He did not bring a walking aid. Cerebellar testing: No dysmetria or intention tremor on finger to nose testing. Heel to shin Was not attempted secondary to knee pain bilaterally.  Sensory exam reveals no obvious numbness, he has intact sensation to light touch throughout.   Assessment and Plan:  Assessment and Plan:  In summary, [name] is a very pleasant 73 y.o.-year old male  with an underlying complex medical history of hypertension, hyperlipidemia, gout, diabetes, coronary artery disease, history of mitral valve regurgitation, aortic stenosis, carotid artery disease, spinal stenosis of the lumbar spine, status post lumbar spine surgery in September 2019 under Dr. Melvyn Novas, history of aortic valve replacement, status post cardiac pacemaker placement, status post CABG, knee arthritis bilaterally, status post lumbar laminectomy, and obesity, who presents For initial consultation of his hand tremors but also gait changes and posture changes noted, concerning for parkinsonism.  His history and examination are indeed in keeping with parkinsonism, likely right-sided predominant Parkinson's disease.  His examination stands a little in contrast with his history of only 1 year of tremor.  In further questioning them, it is possible that he has had symptoms dating back to 2 or 3 or even 5 years, particularly with changes in his posture, slowness, fine motor dyscontrol and balance issues noted per wife.  His tremor has been more apparent since his back surgery he believes.  Atypical parkinsonism could be in the differential especially given the presentation of bilateral involvement  at this time, nevertheless, there are no significant red flags for atypical parkinsonism at this time, we can consider a DaTscan also down the road.  He cannot have an MRI because of his pacemaker.I suggested weGo ahead with symptomatic treatment with Sinemet, 25-100 mg strength half a pill twice daily with gradual titration to 1 pill 3 times daily.We talked about the importance of maintaining a healthy lifestyle and fall prevention.  He is advised to start using his walker because of his balance issues and fall risk.  For constipation, he is reminded to be very proactive, adding senna on a daily basis or Metamucil or a laxative on a regular basis if needed. He is sleepy during the day.  I would like to consider a sleep study in the Near future.  For now, we will proceed with Sinemet titration.  I plan to see him back in 3 months, sooner if needed.  I answered all their questions today and the patient and his wife are in agreement Thank you very much for allowing me to participate in the care of this nice patient. If I  can be of any further assistance to you please do not hesitate to call me at [phone].  Sincerely,   Star Age, MD, PhD

## 2019-03-22 ENCOUNTER — Ambulatory Visit (INDEPENDENT_AMBULATORY_CARE_PROVIDER_SITE_OTHER): Payer: Medicare HMO

## 2019-03-22 ENCOUNTER — Other Ambulatory Visit: Payer: Self-pay

## 2019-03-22 DIAGNOSIS — Z23 Encounter for immunization: Secondary | ICD-10-CM

## 2019-04-02 ENCOUNTER — Telehealth: Payer: Self-pay | Admitting: Neurology

## 2019-04-02 NOTE — Telephone Encounter (Signed)
Please ask patient to reduce the Sinemet back to half a pill 3 times daily for 3 days and then stop it altogether, if his symptoms improve, we can consider another medication in the near future, please ask him to give [place] an update in the next couple of weeks by phone or email via my chart messaging.

## 2019-04-02 NOTE — Telephone Encounter (Signed)
[name] wife called and states that the last dosage the patient is taking of the carbidopa-levodopa (SINEMET IR) 25-100 MG tablet is making him nauseated and has given him diarrhea. Please advise.

## 2019-04-02 NOTE — Telephone Encounter (Signed)
I called pt, spoke to pt's wife Mel Almond, per DPR, and advised her of these recommendations. [name] wife verbalized understanding and had no further questions.

## 2019-04-02 NOTE — Telephone Encounter (Signed)
I called pt, spoke to pt's wife Mel Almond, per DPR. She reports that pt started the TID dosing of sinemet about 2 weeks ago. About a week after starting the TID dosing he noticed he was feeling nauseous and started having diarrhea. He has not been checked out by his PCP.

## 2019-04-04 ENCOUNTER — Ambulatory Visit: Payer: Medicare HMO | Admitting: Cardiology

## 2019-04-04 ENCOUNTER — Encounter: Payer: Self-pay | Admitting: Cardiology

## 2019-04-04 ENCOUNTER — Other Ambulatory Visit: Payer: Self-pay

## 2019-04-04 VITALS — BP 140/80 | HR 64 | Ht 67.5 in | Wt 217.2 lb

## 2019-04-04 DIAGNOSIS — I779 Disorder of arteries and arterioles, unspecified: Secondary | ICD-10-CM

## 2019-04-04 DIAGNOSIS — I10 Essential (primary) hypertension: Secondary | ICD-10-CM

## 2019-04-04 DIAGNOSIS — I739 Peripheral vascular disease, unspecified: Secondary | ICD-10-CM | POA: Diagnosis not present

## 2019-04-04 DIAGNOSIS — Z951 Presence of aortocoronary bypass graft: Secondary | ICD-10-CM | POA: Diagnosis not present

## 2019-04-04 DIAGNOSIS — I442 Atrioventricular block, complete: Secondary | ICD-10-CM

## 2019-04-04 DIAGNOSIS — Z952 Presence of prosthetic heart valve: Secondary | ICD-10-CM | POA: Diagnosis not present

## 2019-04-04 NOTE — Patient Instructions (Signed)
Medication Instructions:  Your provider recommends that you continue on your current medications as directed. Please refer to the Current Medication list given to you today.    Labwork: None  Testing/Procedures: None  Follow-Up: Your provider wants you to follow-up in: 1 year with Dr. Marlou Porch or his assistant. You will receive a reminder letter in the mail two months in advance. If you don't receive a letter, please call our office to schedule the follow-up appointment.    Any Other Special Instructions Will Be Listed Below (If Applicable).     If you need a refill on your cardiac medications before your next appointment, please call your pharmacy.

## 2019-04-04 NOTE — Progress Notes (Signed)
Cardiology Office Note   Date:  04/04/2019   ID:  [name], DOB 10-29-1945, MRN [id number]  PCP:  [name], NP  Cardiologist:   Candee Furbish, MD       History of Present Illness: [name] is a 73 y.o. male former patient of Dr. Ron Parker here for followup. Has coronary artery disease as well as pacemaker, and aortic valve disease.  Pacer ERI - changeout sched soon. Dr. [facility] stay 01/2016 - CP, neg trop. ECHO reassuring.  In 2014, echo showed normal LV function with mild inferior hypokinesis. Has a mechanical aortic valve working well. Mild mitral regurgitation. Nuclear stress test in 2014 also showed no ischemia.  Knee discomfort has been an issue. He was a Development worker, community for several years. Has lower extremity edema bilaterally, mostly on right leg. Overall doing well. Feels some mild shortness of breath.  Weight loss has been an issue for him.  No chest pain, no syncope, no bleeding.  Previously had a hernia surgery and while on Lovenox bridge protocol bled, had to go back to the hospital for 2 weeks.  05/25/17-I reviewed office note from 05/11/17 from Dr. Cristopher Peru regarding his pacemaker and previous complete heart block.  He has no escape.  [place].  02/28/2018-overall he has been doing very well except for his back pain.  He is planning to undergo surgery.  He is here for preoperative risk assessment.  He has not been having any anginal symptoms, no significant shortness of breath.  His last stress test was within the 5-year window and was overall low risk with no ischemia.  Echocardiogram showed a normal functioning mechanical aortic valve previously.  Sharp S2 click noted on exam.  Note he has had prior bleeding with Lovenox bridge.  No prior stroke.  04/04/19 - here for follow up CAD/valve/ pacemaker. Parkinson, last visit Dr. Rexene Alberts with neuro. denies any fevers chills nausea vomiting syncope bleeding.  He did have some constipation which is  associated with his Parkinson's.  When increasing his Sinemet, began to have some diarrhea he has backing down on this.  Tremor noted after his back surgery.  Slowly progressive.  Denies any cardiac symptoms at this time.  Doing well.  Past Medical History:  Diagnosis Date  . Acute renal failure ([place]) 02/09/2013  . Aortic stenosis 08/25/2010   a. Bentall aortic root replacement with a St. Jude mechanical valve and Hemashield conduit 02/2004.  Marland Kitchen Arthritis   . Asthma   . AV BLOCK, COMPLETE    a. s/p St Jude dual chamber pacemaker 02/2004.  Marland Kitchen CAD (coronary artery disease)    a. s/p CABGx2 (LIMA-dLAD, SVG-Cx). b. Low risk nuc 12/2012 without ischemia, EF 46% mild apical hypokinesia (EF 55% inf HK by echo).  . Carotid artery disease ([place])    a. 0-39% bilateral ICA stenosis, stable mild hard plaque in carotid bulbs. F/u 03/2014 recommended.  . Diabetes mellitus without complication ([place])    type 2   . DIVERTICULOSIS, COLON 10/17/2007  . Ejection fraction    a. EF 55% with inf HK, mild MR by echo 12/2012.  Marland Kitchen GERD (gastroesophageal reflux disease)    hxof   . GOUT 01/16/2007  . Headache    hx of migraines   . HEMORRHOIDS, INTERNAL 11/01/2008  . Hx of adenomatous polyp of colon 02/2019   no recall needed  . HYPERLIPIDEMIA 01/16/2007  . HYPERTENSION 01/16/2007  . Impaired glucose tolerance   . LEG EDEMA  11/27/2008  . Special screening for malignant neoplasm of prostate   . Warfarin anticoagulation    AVR    Past Surgical History:  Procedure Laterality Date  . AORTIC VALVE REPLACEMENT    . CARDIAC CATHETERIZATION  06/2003  . CIRCUMCISION    . COLONOSCOPY    . CORONARY ARTERY BYPASS GRAFT    . coronary stents      prior to bypass   . EP IMPLANTABLE DEVICE N/A 05/06/2016   Procedure: PPM Generator Changeout;  Surgeon: Evans Lance, MD;  Location: [facility];  Service: Cardiovascular;  Laterality: N/A;  . ESOPHAGOGASTRODUODENOSCOPY    . HERNIA REPAIR    . INGUINAL HERNIA REPAIR Bilateral  02/07/2013   Procedure: LAPAROSCOPIC BILATERAL INGUINAL HERNIA REPAIR;  Surgeon: Imogene Burn. Georgette Dover, MD;  Location: [facility];  Service: General;  Laterality: Bilateral;  . INSERTION OF MESH Bilateral 02/07/2013   Procedure: INSERTION OF MESH;  Surgeon: Imogene Burn. Georgette Dover, MD;  Location: [facility];  Service: General;  Laterality: Bilateral;  . LUMBAR LAMINECTOMY/DECOMPRESSION MICRODISCECTOMY N/A 03/15/2018   Procedure: Complete decompressive lumbar laminectomy for spinal stenosis of L4-L5 and foraminotomy for L4-L5 root on right;  Surgeon: [name], MD;  Location: [facility];  Service: Orthopedics;  Laterality: N/A;  . PACEMAKER INSERTION    . SHOULDER SURGERY Bilateral      Current Outpatient Medications  Medication Sig Dispense Refill  . ACCU-CHEK FASTCLIX LANCETS MISC USE TO CHECK BLOOD SUGAR TWICE A DAY AND AS NEEDED 306 each 3  . acetaminophen (TYLENOL ARTHRITIS PAIN) 650 MG CR tablet Take 1,300 mg by mouth 2 (two) times daily.    Marland Kitchen albuterol (PROVENTIL HFA;VENTOLIN HFA) 108 (90 Base) MCG/ACT inhaler Inhale 2 puffs into the lungs every 6 (six) hours as needed for wheezing or shortness of breath. 3 Inhaler 0  . Ascorbic Acid (VITAMIN C) 500 MG tablet Take 1,000 mg by mouth daily.     Marland Kitchen aspirin 81 MG tablet Take 81 mg by mouth every evening.     Marland Kitchen atorvastatin (LIPITOR) 40 MG tablet Take 1 tablet (40 mg total) by mouth daily at 6 PM. Please make annual appt with Dr. Lovena Le for future refills. [phone]. 90 tablet 0  . calcium carbonate (OS-CAL) 600 MG TABS Take 600 mg by mouth 2 (two) times daily with a meal.    . carbidopa-levodopa (SINEMET IR) 25-100 MG tablet Take 1/2 pill twice daily x 1 week, then 1/2 pill 3 times a day x 1 week, then 1 pill 3 times a day thereafter. 90 tablet 5  . cetirizine (ZYRTEC) 10 MG tablet Take 10 mg by mouth daily.     . fluticasone (FLONASE) 50 MCG/ACT nasal spray Place 2 sprays into both nostrils daily. 16 g 6  . furosemide (LASIX) 40 MG tablet TAKE 1 TABLET TWICE  DAILY 180 tablet 3  . Glucosamine-Chondroitin 250-200 MG CAPS Take 2 capsules by mouth daily.     Marland Kitchen glucose blood (ACCU-CHEK SMARTVIEW) test strip USE TO CHECK BLOOD SUGAR TWICE A DAY AND PRN 300 each 3  . guaiFENesin-dextromethorphan (ROBITUSSIN DM) 100-10 MG/5ML syrup Take 5 mLs by mouth every 4 (four) hours as needed for cough.    . losartan (COZAAR) 100 MG tablet Take 1 tablet (100 mg total) by mouth daily. Please make annual appt with Dr. Lovena Le for future refills. [phone]. 90 tablet 0  . metFORMIN (GLUCOPHAGE) 1000 MG tablet Take 1 tablet (1,000 mg total) by mouth 2 (two) times daily with a  meal. 180 tablet 0  . metoprolol succinate (TOPROL-XL) 100 MG 24 hr tablet TAKE 1 TABLET EVERY DAY 90 tablet 3  . Misc Natural Products (TART CHERRY ADVANCED) CAPS Take 2 capsules by mouth daily.     . Multiple Vitamin (MULTIVITAMIN) tablet Take 1 tablet by mouth daily.      . nitroGLYCERIN (NITROSTAT) 0.4 MG SL tablet Place 1 tablet (0.4 mg total) under the tongue every 5 (five) minutes as needed. For chest pain 25 tablet 3  . Omega-3 Fatty Acids (FISH OIL) 1200 MG CAPS Take 1,200 mg by mouth daily.     . potassium chloride (K-DUR) 10 MEQ tablet Take 1 tablet (10 mEq total) by mouth 2 (two) times daily. 180 tablet 3  . Probiotic Product (PHILLIPS COLON HEALTH PO) Take 1 capsule by mouth daily.    . vitamin B-12 (CYANOCOBALAMIN) 1000 MCG tablet Take 1,000 mcg by mouth daily.    Marland Kitchen warfarin (COUMADIN) 10 MG tablet TAKE 1 TABLET DAILY OR AS DIRECTED BY COUMADIN CLINIC 90 tablet 1   No current facility-administered medications for this visit.     Allergies:   Bee venom, Codeine phosphate, Metoprolol tartrate, Tramadol, and Ramipril    Social History:  The patient  reports that he quit smoking about 45 years ago. He has never used smokeless tobacco. He reports that he does not drink alcohol or use drugs. Retired Development worker, community    Family History:  The patient's family history includes Diabetes in his  brother, brother, mother, and sister; Epilepsy in his sister; Heart attack in his father; Hypertension in his mother; Kidney cancer in his father.    ROS:  Please see the history of present illness.      All other systems are reviewed and negative.    PHYSICAL EXAM: VS:  BP 140/80   Pulse 64   Ht 5' 7.5" (1.715 m)   Wt 217 lb 2.9 oz (98.5 kg)   BMI 33.51 kg/m  , BMI Body mass index is 33.51 kg/m. GEN: Well nourished, well developed, in no acute distress  HEENT: normal  Neck: no JVD, carotid bruits, or masses Cardiac: S2 click RRR; no murmurs, rubs, or gallops,no edema pacemaker noted Respiratory:  clear to auscultation bilaterally, normal work of breathing GI: soft, nontender, nondistended, + BS MS: no deformity or atrophy  Skin: warm and dry, no rash Neuro:  Alert and Oriented x 3, Strength and sensation are intact, parkinsonian tremor noted Psych: euthymic mood, full affect      EKG: Today 04/04/2019-sinus rhythm first-degree AV block 272 ms with paced rhythm.  02/28/2018- AV pacing 71 bpm personally viewed  Recent Labs: 12/28/2018: ALT 19; BUN 17; Creatinine, Ser 0.84; Hemoglobin 13.9; Platelets 213.0; Potassium 4.4; Sodium 142; TSH 1.47    Lipid Panel    Component Value Date/Time   CHOL 116 12/28/2018 1004   TRIG 86.0 12/28/2018 1004   TRIG 91 06/14/2006 0825   HDL 47.10 12/28/2018 1004   CHOLHDL 2 12/28/2018 1004   VLDL 17.2 12/28/2018 1004   LDLCALC 52 12/28/2018 1004      Wt Readings from Last 3 Encounters:  04/04/19 217 lb 2.9 oz (98.5 kg)  03/07/19 215 lb (97.5 kg)  02/14/19 211 lb (95.7 kg)    04/2014-vascular study, carotids Heterogeneous plaque, bilaterally. Stable, 1-39% bilateral ICA stenosis. Patent vertebral arteries with antegrade flow. Normal subclavian arteries, bilaterally. f/u 2 years.  Other studies Reviewed: ECHO 02/06/16: - Left ventricle: LVEF is approximately 55% with hypokinesis of the  distal inferior/inferoseptal walls. The  cavity size was normal.   Wall thickness was normal. Doppler parameters are consistent with   abnormal left ventricular relaxation (grade 1 diastolic   dysfunction). - Aortic valve: AV prosthesis appears to move well Peak and mean   gradients through the valve are 16 and 10 mm Hg respectively.   There was trivial regurgitation. Valve area (VTI): 2.77 cm^2.   Valve area (Vmax): 2.63 cm^2. Valve area (Vmean): 2.55 cm^2. - Right ventricle: The cavity size was mildly dilated. Wall   thickness was normal. - Right atrium: The atrium was mildly dilated.  Additional studies/ records that were reviewed today include: Prior office notes reviewed, lab work reviewed, previous LDL 121 now 52. Review of the above records demonstrates: As above   ASSESSMENT AND PLAN:   1.  Aortic stenosis-history of aortic valve replacement in 2005, Bentall, mechanical. Echo 2017 functioning well.  Excellent.  No changes.  Sharp click.  2. History of aortic valve replacement, mechanical-2005 as above. Functioning well. Echocardiogram reviewed.  Coumadin clinic.  Dental prophylaxis.  Sharp click, stable.  3. Coronary artery disease-bypass surgery at the time of aortic valve replacement 2005. 2013 no ischemia on nuclear stress test.  Continue with secondary prevention efforts, statin etc.  4. Hyperlipidemia-previously changed from Pravachol to atorvastatin, guideline directed therapy. LDL goal 70. Recently 57. Dr. Carlisle Cater has been checking labs.  No myalgias, doing well.  5. Carotid artery disease-mild plaque previously. Stable disease. Mild plaque bilaterally.  Continue with secondary prevention, no changes  6. Pacemaker-change out 05/06/16 - Dr. Lovena Le. Visit reviewed.  No changes made.  Reminder, he does NOT have an escape rhythm.  7. Warfarin- anticoagulated for his aortic valve prosthesis.  No changes, stable  8. Obesity-continue to encourage weight loss, decrease carbohydrates.  Doing well with his diet.  BMI  33  9.  Parkinson's- neurology notes reviewed.   1 year follow-up  Current medicines are reviewed at length with the patient today.  The patient does not have concerns regarding medicines.  The following changes have been made:  no change  Labs/ tests ordered today include: none   Orders Placed This Encounter  Procedures  . EKG 12-Lead     Disposition:   FU with [name] in 6 months , [name] in 1 year  Signed, Candee Furbish, MD  04/04/2019 9:43 AM    [facility] [place], [place], [place]  [address] Phone: [phone]; Fax: [phone]

## 2019-04-05 ENCOUNTER — Other Ambulatory Visit: Payer: Self-pay | Admitting: Cardiology

## 2019-04-05 ENCOUNTER — Other Ambulatory Visit: Payer: Self-pay | Admitting: Adult Health

## 2019-04-06 ENCOUNTER — Other Ambulatory Visit: Payer: Self-pay | Admitting: Family Medicine

## 2019-04-06 DIAGNOSIS — E119 Type 2 diabetes mellitus without complications: Secondary | ICD-10-CM

## 2019-04-06 NOTE — Telephone Encounter (Signed)
Sent to the pharmacy by e-scribe for 90 days.  I have scheduled the pt for A1C lab work and follow up with [facility] [employer].

## 2019-04-09 ENCOUNTER — Other Ambulatory Visit (INDEPENDENT_AMBULATORY_CARE_PROVIDER_SITE_OTHER): Payer: Medicare HMO

## 2019-04-09 ENCOUNTER — Other Ambulatory Visit: Payer: Self-pay

## 2019-04-09 DIAGNOSIS — E119 Type 2 diabetes mellitus without complications: Secondary | ICD-10-CM

## 2019-04-09 LAB — HEMOGLOBIN A1C: Hgb A1c MFr Bld: 6.3 % (ref 4.6–6.5)

## 2019-04-11 ENCOUNTER — Other Ambulatory Visit: Payer: Self-pay

## 2019-04-11 ENCOUNTER — Telehealth (INDEPENDENT_AMBULATORY_CARE_PROVIDER_SITE_OTHER): Payer: Medicare HMO | Admitting: Adult Health

## 2019-04-11 ENCOUNTER — Other Ambulatory Visit: Payer: Self-pay | Admitting: Family Medicine

## 2019-04-11 DIAGNOSIS — E119 Type 2 diabetes mellitus without complications: Secondary | ICD-10-CM

## 2019-04-11 MED ORDER — ALBUTEROL SULFATE HFA 108 (90 BASE) MCG/ACT IN AERS: 2.0000 | INHALATION_SPRAY | Freq: Four times a day (QID) | RESPIRATORY_TRACT | 0 refills | Status: DC | PRN

## 2019-04-11 NOTE — Telephone Encounter (Signed)
Sent to the pharmacy by e-scribe. 

## 2019-04-11 NOTE — Progress Notes (Signed)
Virtual Visit via Video Note  I connected with [name] on 04/11/19 at  9:30 AM EDT by a video enabled telemedicine application and verified that I am speaking with the correct person using two identifiers.  Location patient: home Location provider:work or home office Persons participating in the virtual visit: patient, provider  I discussed the limitations of evaluation and management by telemedicine and the availability of in person appointments. The patient expressed understanding and agreed to proceed.   HPI: 73 year old male who is being evaluated today for 53-month follow-up regarding diabetes mellitus.  He is currently prescribed metformin 1000 mg twice daily and has been very well controlled on this for some time now.  Does monitor his blood sugars at home and reports readings consistently in the 120s.  He denies hypoglycemic events.  His last A1c in June 2020 was 6.4.  Since we last spoke, he has been seen by neurology for bilateral upper extremity tremors.  He was diagnosed with Parkinson's disease.  He was started on Sinemet by neurology, but when he got to the dosing of 1 pill 3 times a day he started having abdominal pain and diarrhea.  Medication dose was then reduced to one half a pill 3 times daily for 3 days and then stop it altogether.  They have an upcoming appointment with neurology to discuss other medications.   ROS: See pertinent positives and negatives per HPI.  Past Medical History:  Diagnosis Date  . Acute renal failure ([place]) 02/09/2013  . Aortic stenosis 08/25/2010   a. Bentall aortic root replacement with a St. Jude mechanical valve and Hemashield conduit 02/2004.  Marland Kitchen Arthritis   . Asthma   . AV BLOCK, COMPLETE    a. s/p St Jude dual chamber pacemaker 02/2004.  Marland Kitchen CAD (coronary artery disease)    a. s/p CABGx2 (LIMA-dLAD, SVG-Cx). b. Low risk nuc 12/2012 without ischemia, EF 46% mild apical hypokinesia (EF 55% inf HK by echo).  . Carotid artery disease ([place])     a. 0-39% bilateral ICA stenosis, stable mild hard plaque in carotid bulbs. F/u 03/2014 recommended.  . Diabetes mellitus without complication ([place])    type 2   . DIVERTICULOSIS, COLON 10/17/2007  . Ejection fraction    a. EF 55% with inf HK, mild MR by echo 12/2012.  Marland Kitchen GERD (gastroesophageal reflux disease)    hxof   . GOUT 01/16/2007  . Headache    hx of migraines   . HEMORRHOIDS, INTERNAL 11/01/2008  . Hx of adenomatous polyp of colon 02/2019   no recall needed  . HYPERLIPIDEMIA 01/16/2007  . HYPERTENSION 01/16/2007  . Impaired glucose tolerance   . LEG EDEMA 11/27/2008  . Special screening for malignant neoplasm of prostate   . Warfarin anticoagulation    AVR    Past Surgical History:  Procedure Laterality Date  . AORTIC VALVE REPLACEMENT    . CARDIAC CATHETERIZATION  06/2003  . CIRCUMCISION    . COLONOSCOPY    . CORONARY ARTERY BYPASS GRAFT    . coronary stents      prior to bypass   . EP IMPLANTABLE DEVICE N/A 05/06/2016   Procedure: PPM Generator Changeout;  Surgeon: Evans Lance, MD;  Location: [facility];  Service: Cardiovascular;  Laterality: N/A;  . ESOPHAGOGASTRODUODENOSCOPY    . HERNIA REPAIR    . INGUINAL HERNIA REPAIR Bilateral 02/07/2013   Procedure: LAPAROSCOPIC BILATERAL INGUINAL HERNIA REPAIR;  Surgeon: Imogene Burn. Tsuei, MD;  Location: [facility];  Service:  General;  Laterality: Bilateral;  . INSERTION OF MESH Bilateral 02/07/2013   Procedure: INSERTION OF MESH;  Surgeon: Imogene Burn. Georgette Dover, MD;  Location: [facility];  Service: General;  Laterality: Bilateral;  . LUMBAR LAMINECTOMY/DECOMPRESSION MICRODISCECTOMY N/A 03/15/2018   Procedure: Complete decompressive lumbar laminectomy for spinal stenosis of L4-L5 and foraminotomy for L4-L5 root on right;  Surgeon: [name], MD;  Location: [facility];  Service: Orthopedics;  Laterality: N/A;  . PACEMAKER INSERTION    . SHOULDER SURGERY Bilateral     Family History  Problem Relation Age of Onset  . Heart attack Father    . Kidney cancer Father   . Hypertension Mother   . Diabetes Mother   . Diabetes Brother   . Epilepsy Sister   . Diabetes Sister   . Diabetes Brother   . Colon cancer Neg Hx   . Rectal cancer Neg Hx   . Stomach cancer Neg Hx   . Esophageal cancer Neg Hx      Current Outpatient Medications:  .  ACCU-CHEK FASTCLIX LANCETS MISC, USE TO CHECK BLOOD SUGAR TWICE A DAY AND AS NEEDED, Disp: 306 each, Rfl: 3 .  acetaminophen (TYLENOL ARTHRITIS PAIN) 650 MG CR tablet, Take 1,300 mg by mouth 2 (two) times daily., Disp: , Rfl:  .  albuterol (PROVENTIL HFA;VENTOLIN HFA) 108 (90 Base) MCG/ACT inhaler, Inhale 2 puffs into the lungs every 6 (six) hours as needed for wheezing or shortness of breath., Disp: 3 Inhaler, Rfl: 0 .  Ascorbic Acid (VITAMIN C) 500 MG tablet, Take 1,000 mg by mouth daily. , Disp: , Rfl:  .  aspirin 81 MG tablet, Take 81 mg by mouth every evening. , Disp: , Rfl:  .  atorvastatin (LIPITOR) 40 MG tablet, Take 1 tablet (40 mg total) by mouth daily at 6 PM. Please make annual appt with Dr. Lovena Le for future refills. [phone]., Disp: 90 tablet, Rfl: 0 .  calcium carbonate (OS-CAL) 600 MG TABS, Take 600 mg by mouth 2 (two) times daily with a meal., Disp: , Rfl:  .  carbidopa-levodopa (SINEMET IR) 25-100 MG tablet, Take 1/2 pill twice daily x 1 week, then 1/2 pill 3 times a day x 1 week, then 1 pill 3 times a day thereafter., Disp: 90 tablet, Rfl: 5 .  cetirizine (ZYRTEC) 10 MG tablet, Take 10 mg by mouth daily. , Disp: , Rfl:  .  fluticasone (FLONASE) 50 MCG/ACT nasal spray, Place 2 sprays into both nostrils daily., Disp: 16 g, Rfl: 6 .  furosemide (LASIX) 40 MG tablet, TAKE 1 TABLET TWICE DAILY, Disp: 180 tablet, Rfl: 3 .  Glucosamine-Chondroitin 250-200 MG CAPS, Take 2 capsules by mouth daily. , Disp: , Rfl:  .  glucose blood (ACCU-CHEK SMARTVIEW) test strip, USE TO CHECK BLOOD SUGAR TWICE A DAY AND PRN, Disp: 300 each, Rfl: 3 .  guaiFENesin-dextromethorphan (ROBITUSSIN DM) 100-10  MG/5ML syrup, Take 5 mLs by mouth every 4 (four) hours as needed for cough., Disp: , Rfl:  .  losartan (COZAAR) 100 MG tablet, Take 1 tablet (100 mg total) by mouth daily. Please make annual appt with Dr. Lovena Le for future refills. [phone]., Disp: 90 tablet, Rfl: 0 .  metFORMIN (GLUCOPHAGE) 1000 MG tablet, TAKE 1 TABLET (1,000 MG TOTAL) BY MOUTH 2 (TWO) TIMES DAILY WITH A MEAL., Disp: 180 tablet, Rfl: 0 .  metoprolol succinate (TOPROL-XL) 100 MG 24 hr tablet, TAKE 1 TABLET EVERY DAY, Disp: 90 tablet, Rfl: 3 .  Misc Natural Products (TART CHERRY ADVANCED) CAPS,  Take 2 capsules by mouth daily. , Disp: , Rfl:  .  Multiple Vitamin (MULTIVITAMIN) tablet, Take 1 tablet by mouth daily.  , Disp: , Rfl:  .  nitroGLYCERIN (NITROSTAT) 0.4 MG SL tablet, Place 1 tablet (0.4 mg total) under the tongue every 5 (five) minutes as needed. For chest pain, Disp: 25 tablet, Rfl: 3 .  Omega-3 Fatty Acids (FISH OIL) 1200 MG CAPS, Take 1,200 mg by mouth daily. , Disp: , Rfl:  .  potassium chloride (K-DUR) 10 MEQ tablet, Take 1 tablet (10 mEq total) by mouth 2 (two) times daily., Disp: 180 tablet, Rfl: 3 .  Probiotic Product (PHILLIPS COLON HEALTH PO), Take 1 capsule by mouth daily., Disp: , Rfl:  .  vitamin B-12 (CYANOCOBALAMIN) 1000 MCG tablet, Take 1,000 mcg by mouth daily., Disp: , Rfl:  .  warfarin (COUMADIN) 10 MG tablet, TAKE 1 TABLET DAILY OR AS DIRECTED BY COUMADIN CLINIC, Disp: 90 tablet, Rfl: 1  EXAM:  VITALS per patient if applicable:  GENERAL: alert, oriented, appears well and in no acute distress  HEENT: atraumatic, conjunttiva clear, no obvious abnormalities on inspection of external nose and ears  NECK: normal movements of the head and neck  LUNGS: on inspection no signs of respiratory distress, breathing rate appears normal, no obvious gross SOB, gasping or wheezing  CV: no obvious cyanosis  MS: moves all visible extremities without noticeable abnormality  PSYCH/NEURO: pleasant and  cooperative, no obvious depression or anxiety, speech and thought processing grossly intact  ASSESSMENT AND PLAN:  Discussed the following assessment and plan: 1. Diabetes mellitus without complication ([place]) -His [id number] today is 6.3. -Continue with current regimen follow-up in 6 months or sooner if blood sugars consistently above 150   I discussed the assessment and treatment plan with the patient. The patient was provided an opportunity to ask questions and all were answered. The patient agreed with the plan and demonstrated an understanding of the instructions.   The patient was advised to call back or seek an in-person evaluation if the symptoms worsen or if the condition fails to improve as anticipated.   [name], NP

## 2019-04-19 ENCOUNTER — Ambulatory Visit (INDEPENDENT_AMBULATORY_CARE_PROVIDER_SITE_OTHER): Payer: Medicare HMO

## 2019-04-19 ENCOUNTER — Other Ambulatory Visit: Payer: Self-pay

## 2019-04-19 DIAGNOSIS — Z09 Encounter for follow-up examination after completed treatment for conditions other than malignant neoplasm: Secondary | ICD-10-CM | POA: Diagnosis not present

## 2019-04-19 DIAGNOSIS — Z5181 Encounter for therapeutic drug level monitoring: Secondary | ICD-10-CM | POA: Diagnosis not present

## 2019-04-19 DIAGNOSIS — Z952 Presence of prosthetic heart valve: Secondary | ICD-10-CM | POA: Diagnosis not present

## 2019-04-19 LAB — POCT INR: INR: 2.7 (ref 2.0–3.0)

## 2019-04-19 NOTE — Patient Instructions (Signed)
Description   Continue on same dosage 1 tablet every day. Recheck in 8 weeks. Call with any changes. # [phone].

## 2019-04-20 ENCOUNTER — Encounter: Payer: Self-pay | Admitting: Neurology

## 2019-04-23 ENCOUNTER — Telehealth: Payer: Self-pay | Admitting: Neurology

## 2019-04-23 MED ORDER — ROPINIROLE HCL 0.25 MG PO TABS: ORAL_TABLET | ORAL | 3 refills | Status: DC

## 2019-04-23 NOTE — Telephone Encounter (Signed)
Please see my chart message for reference. Pt had nausea and diarrhea on C/L, has been off sinemet.  I would like to suggest he start Requip (generic name: ropinirole) 0.25 mg: Take one pill twice daily (morning and lunchtime) for one week, then one pill 3 times a day (morning, lunch and evening) for one week, then 2 pills 3 times a day for one week, then 3 pills three times a day thereafter. Common side effects reported are: Sedation, sleepiness, nausea, vomiting, and rare side effects are confusion, hallucinations, swelling in legs, and abnormal behaviors, including impulse control problems, which can manifest as excessive eating, obsessions with food or gambling, or hypersexuality.  Please advise him, that he may need his INR checked more frequently until he is on a steady dose of the new medication as the requip may increase the INR. Please have his discuss this also with his prescribing physician for the coumadin.

## 2019-04-23 NOTE — Telephone Encounter (Signed)
Noted, thank you

## 2019-04-23 NOTE — Telephone Encounter (Signed)
I called pt and discussed these recommendations with him. He does not want to start requip at this time. He would prefer to discuss this with Dr. Rexene Alberts at his appt in November. I offered pt a sooner appt but he would rather keep the November appt.

## 2019-05-02 ENCOUNTER — Telehealth: Payer: Self-pay

## 2019-05-02 NOTE — Telephone Encounter (Signed)
[name] wife called states pt has been given a rx for Ropinirole for Parkinson's Disease.Wife states they were advised it could effect his Coumadin levels.  Per Micromedex major drug interaction, discussed with Fuller Canada, PharmD.  She states Ropinirole does not typically have a major effect on INR.  Micromedex states it can increase the INR.  She advised to schedule pt an appt to check INR in 2 weeks after start.  [name] wife states she is going to have pt start taking on Saturday 05/05/19, made appt for 05/21/19.  Pt will be tapering up on dosage of Ropinirole over a 4 week period of time, upto 9 tablets daily.

## 2019-05-04 DIAGNOSIS — C44311 Basal cell carcinoma of skin of nose: Secondary | ICD-10-CM | POA: Diagnosis not present

## 2019-05-04 DIAGNOSIS — Z85828 Personal history of other malignant neoplasm of skin: Secondary | ICD-10-CM | POA: Diagnosis not present

## 2019-05-04 HISTORY — PX: SKIN BIOPSY: SHX1

## 2019-05-07 ENCOUNTER — Encounter: Payer: Self-pay | Admitting: Internal Medicine

## 2019-05-07 ENCOUNTER — Other Ambulatory Visit: Payer: Self-pay

## 2019-05-07 ENCOUNTER — Ambulatory Visit (INDEPENDENT_AMBULATORY_CARE_PROVIDER_SITE_OTHER): Payer: Medicare HMO | Admitting: Internal Medicine

## 2019-05-07 DIAGNOSIS — I10 Essential (primary) hypertension: Secondary | ICD-10-CM | POA: Diagnosis not present

## 2019-05-07 NOTE — Progress Notes (Signed)
   Subjective:   Patient ID: [name], male    DOB: 06/23/1946, 73 y.o.   MRN: [id number]  HPI The patient is a 73 YO man coming in for unclear purpose. He was told to come in for something but not sure what. He is not sure who called him about this. I do not see any notes in the system from PCP or any other person who documents need for any testing etc. He denies any new problems. Is just starting new medicine requip with his neurologist for his parkinson's. Has just taken that 2 days now. Denies chest pains or SOB or abdominal problems. No burning with urination or pain with urination or blood in urine. No change to his lasix dosing recently.   Review of Systems  Constitutional: Negative.   HENT: Negative.   Eyes: Negative.   Respiratory: Negative for cough, chest tightness and shortness of breath.   Cardiovascular: Negative for chest pain, palpitations and leg swelling.  Gastrointestinal: Negative for abdominal distention, abdominal pain, constipation, diarrhea, nausea and vomiting.  Musculoskeletal: Negative.   Skin: Negative.   Neurological: Negative.   Psychiatric/Behavioral: Negative.     Objective:  Physical Exam Constitutional:      Appearance: He is well-developed.  HENT:     Head: Normocephalic and atraumatic.  Neck:     Musculoskeletal: Normal range of motion.  Cardiovascular:     Rate and Rhythm: Normal rate and regular rhythm.     Heart sounds: Murmur present.     Comments: Click from valve present Pulmonary:     Effort: Pulmonary effort is normal. No respiratory distress.     Breath sounds: Normal breath sounds. No wheezing or rales.  Abdominal:     General: Bowel sounds are normal. There is no distension.     Palpations: Abdomen is soft.     Tenderness: There is no abdominal tenderness. There is no rebound.  Skin:    General: Skin is warm and dry.  Neurological:     Mental Status: He is alert and oriented to person, place, and time.     Coordination:  Coordination normal.     Comments: Some mild tremor on exam     Vitals:   05/07/19 0905  BP: 120/90  Pulse: 67  Temp: 97.9 F (36.6 C)  TempSrc: Oral  SpO2: 98%  Weight: 212 lb (96.2 kg)  Height: 5' 7.5" (1.715 m)    Assessment & Plan:  Visit time 25 minutes: greater than 50% of that time was spent in face to face counseling and coordination of care with the patient: counseled about lack of recent changes and explanation of review of the medical records and most recent labs.

## 2019-05-07 NOTE — Assessment & Plan Note (Signed)
BP is normal today without morning meds yet. Most recent kidney and liver function normal. Schedule for INR on 05/21/19. No indication for labs or changes today. Counseled patient and his wife regarding this.

## 2019-05-09 LAB — CUP PACEART REMOTE DEVICE CHECK
Battery Remaining Longevity: 127 mo
Battery Remaining Percentage: 95.5 %
Battery Voltage: 2.99 V
Brady Statistic AP VP Percent: 26 %
Brady Statistic AP VS Percent: 1 %
Brady Statistic AS VP Percent: 74 %
Brady Statistic AS VS Percent: 1 %
Brady Statistic RA Percent Paced: 25 %
Brady Statistic RV Percent Paced: 99 %
Date Time Interrogation Session: 20201028060013
Implantable Lead Implant Date: 20050816
Implantable Lead Implant Date: 20050816
Implantable Lead Location: 753859
Implantable Lead Location: 753860
Implantable Pulse Generator Implant Date: 20171026
Lead Channel Impedance Value: 490 Ohm
Lead Channel Impedance Value: 730 Ohm
Lead Channel Pacing Threshold Amplitude: 0.625 V
Lead Channel Pacing Threshold Amplitude: 1 V
Lead Channel Pacing Threshold Pulse Width: 0.5 ms
Lead Channel Pacing Threshold Pulse Width: 0.5 ms
Lead Channel Sensing Intrinsic Amplitude: 12 mV
Lead Channel Sensing Intrinsic Amplitude: 3.1 mV
Lead Channel Setting Pacing Amplitude: 0.875
Lead Channel Setting Pacing Amplitude: 2 V
Lead Channel Setting Pacing Pulse Width: 0.5 ms
Lead Channel Setting Sensing Sensitivity: 4 mV
Pulse Gen Model: 2272
Pulse Gen Serial Number: 3182792

## 2019-05-10 ENCOUNTER — Ambulatory Visit (INDEPENDENT_AMBULATORY_CARE_PROVIDER_SITE_OTHER): Payer: Medicare HMO | Admitting: *Deleted

## 2019-05-10 ENCOUNTER — Encounter: Payer: Self-pay | Admitting: Family Medicine

## 2019-05-10 DIAGNOSIS — I442 Atrioventricular block, complete: Secondary | ICD-10-CM | POA: Diagnosis not present

## 2019-05-21 ENCOUNTER — Other Ambulatory Visit: Payer: Self-pay

## 2019-05-21 ENCOUNTER — Ambulatory Visit (INDEPENDENT_AMBULATORY_CARE_PROVIDER_SITE_OTHER): Payer: Medicare HMO

## 2019-05-21 DIAGNOSIS — Z09 Encounter for follow-up examination after completed treatment for conditions other than malignant neoplasm: Secondary | ICD-10-CM

## 2019-05-21 DIAGNOSIS — Z5181 Encounter for therapeutic drug level monitoring: Secondary | ICD-10-CM

## 2019-05-21 DIAGNOSIS — Z952 Presence of prosthetic heart valve: Secondary | ICD-10-CM | POA: Diagnosis not present

## 2019-05-21 LAB — POCT INR: INR: 2.7 (ref 2.0–3.0)

## 2019-05-21 NOTE — Patient Instructions (Signed)
Description   Continue on same dosage 1 tablet every day. Recheck in 8 weeks. Call with any changes. # [phone].

## 2019-05-31 ENCOUNTER — Other Ambulatory Visit: Payer: Self-pay

## 2019-05-31 ENCOUNTER — Ambulatory Visit (INDEPENDENT_AMBULATORY_CARE_PROVIDER_SITE_OTHER): Payer: Medicare HMO | Admitting: Neurology

## 2019-05-31 ENCOUNTER — Encounter: Payer: Self-pay | Admitting: Neurology

## 2019-05-31 VITALS — BP 156/87 | HR 76 | Ht 67.0 in | Wt 219.0 lb

## 2019-05-31 DIAGNOSIS — G20 Parkinson's disease: Secondary | ICD-10-CM | POA: Diagnosis not present

## 2019-05-31 MED ORDER — ROPINIROLE HCL 1 MG PO TABS: 1.0000 mg | ORAL_TABLET | Freq: Three times a day (TID) | ORAL | 3 refills | Status: DC

## 2019-05-31 NOTE — Progress Notes (Signed)
Subjective:    Patient ID: [name] is a 73 y.o. male.  HPI     Interim history:   [name] is a 73 year old right-handed gentleman with an underlying complex medical history of hypertension, hyperlipidemia, gout, diabetes, coronary artery disease, history of mitral valve regurgitation, aortic stenosis, carotid artery disease, spinal stenosis of the lumbar spine, status post lumbar spine surgery in September 2019 under Dr. Melvyn Novas, history of aortic valve replacement, status post cardiac pacemaker placement, status post CABG, knee arthritis bilaterally, status post lumbar laminectomy, and obesity, who presents for follow-up consultation of his parkinsonism, likely right-sided predominant Parkinson's disease.  The patient is accompanied by his wife again today.  I first met him on 03/07/2019 at the request of his primary care provider, at which time he reported an at least 1 year history of hand tremor.  Examination revealed parkinsonism with right-sided predominance.  He was advised to start a trial of Sinemet.  We talked about diagnostic testing including a DaTscan.  He has a pacemaker and cannot have an MRI.  His wife called in September reporting that he had nausea and diarrhea.  He was advised to taper off the Sinemet.  He reported back via MyChart message in October that his nausea and diarrhea subsided after he tapered off the Sinemet.  He requested advise regarding another medication.  I suggested we start him on Requip.  He declined and wanted to wait till his appointment.  Today, 05/31/2019: He reports that he ended up starting the ropinirole.  His wife feels that it has helped his tremor.  He also believes that his tremor is less and may his stiffness is less, he still has arthritis in both knees, is status post steroid injections, the injections may last him over 3 months at a time and previously he had gel injections which were more expensive and did not last him as long.  He has  had a little bit of mood irritability per wife.  He denies any frank anxiety or depression.  She reports that he has done well, he is more active, he walks the dog, he also likes to work in the yard, he tries to hydrate with water.  He has had some constipation, generally speaking, has a bowel movement every other day currently.  He likes to eat prunes.  The patient's allergies, current medications, family history, past medical history, past social history, past surgical history and problem list were reviewed and updated as appropriate.   Previously:   03/07/2019: (He) reports a b/l hand tremor for the past year, started after his back surgery and first noticed in the right hand.  He has had some balance issues as well, and further asking them, it is possible that he has had balance problems for the past 3+ years, maybe even 5 years.  His wife has noted a gradual slowing down and difficulty with fine motor skills.  He reports a family history of Parkinson's disease.  His brother died at 57 and had Parkinson's disease as well as complications of diabetes does understand.  He is 1 of a total of 13 children, this includes two stepchildren, one half brother and 3 half-sisters he reports.  No other sibling with Parkinson's disease.  He reports a fall about 2 weeks ago, he feels like he tripped over his own feet.  He fell on his left side and bruised his face but also his left rib cage and left knee.  He did not seek medical  attention at the time.  He has ongoing issues with bilateral knee pain, his right ankle is swollen, he has arthritis in both hands as well.  He has never seen a rheumatologist.  He sleeps in a recliner.  His wife does report that he is sleepy during the day.  He snores a little bit according to her.  She also endorses that he has significant constipation issues, may go 2 or even 3 days without a bowel movement.  He has been trying to eat prunes every day which has helped and she is giving him  Dulcolax as needed.  He tries to hydrate well with water. I reviewed your office note from 02/28/2019.  He has received bilateral cortisone injections into the knees.  He has had falls.  He has had some gait changes including shuffling.  You noticed a tremor.  He had repeat knee steroid injections on 02/28/2019. He has a cane and a walker but does not typically use any 1 of them on a consistent basis.  He did not bring a walking aid today.  His Past Medical History Is Significant For: Past Medical History:  Diagnosis Date  . Acute renal failure ([place]) 02/09/2013  . Aortic stenosis 08/25/2010   a. [place] aortic root replacement with a St. Jude mechanical valve and Hemashield conduit 02/2004.  Marland Kitchen Arthritis   . Asthma   . AV BLOCK, COMPLETE    a. s/p St Jude dual chamber pacemaker 02/2004.  Marland Kitchen CAD (coronary artery disease)    a. s/p CABGx2 (LIMA-dLAD, SVG-Cx). b. Low risk nuc 12/2012 without ischemia, EF 46% mild apical hypokinesia (EF 55% inf HK by echo).  . Carotid artery disease ([place])    a. 0-39% bilateral ICA stenosis, stable mild hard plaque in carotid bulbs. F/u 03/2014 recommended.  . Diabetes mellitus without complication ([place])    type 2   . DIVERTICULOSIS, COLON 10/17/2007  . Ejection fraction    a. EF 55% with inf HK, mild MR by echo 12/2012.  Marland Kitchen GERD (gastroesophageal reflux disease)    hxof   . GOUT 01/16/2007  . Headache    hx of migraines   . HEMORRHOIDS, INTERNAL 11/01/2008  . Hx of adenomatous polyp of colon 02/2019   no recall needed  . HYPERLIPIDEMIA 01/16/2007  . HYPERTENSION 01/16/2007  . Impaired glucose tolerance   . LEG EDEMA 11/27/2008  . Special screening for malignant neoplasm of prostate   . Warfarin anticoagulation    AVR    His Past Surgical History Is Significant For: Past Surgical History:  Procedure Laterality Date  . AORTIC VALVE REPLACEMENT    . CARDIAC CATHETERIZATION  06/2003  . CIRCUMCISION    . COLONOSCOPY    . CORONARY ARTERY BYPASS GRAFT    . coronary  stents      prior to bypass   . EP IMPLANTABLE DEVICE N/A 05/06/2016   Procedure: PPM Generator Changeout;  Surgeon: Evans Lance, MD;  Location: [facility];  Service: Cardiovascular;  Laterality: N/A;  . ESOPHAGOGASTRODUODENOSCOPY    . HERNIA REPAIR    . INGUINAL HERNIA REPAIR Bilateral 02/07/2013   Procedure: LAPAROSCOPIC BILATERAL INGUINAL HERNIA REPAIR;  Surgeon: Imogene Burn. Georgette Dover, MD;  Location: [facility];  Service: General;  Laterality: Bilateral;  . INSERTION OF MESH Bilateral 02/07/2013   Procedure: INSERTION OF MESH;  Surgeon: Imogene Burn. Georgette Dover, MD;  Location: [facility];  Service: General;  Laterality: Bilateral;  . LUMBAR LAMINECTOMY/DECOMPRESSION MICRODISCECTOMY N/A 03/15/2018   Procedure: Complete  decompressive lumbar laminectomy for spinal stenosis of L4-L5 and foraminotomy for L4-L5 root on right;  Surgeon: [name], MD;  Location: [facility];  Service: Orthopedics;  Laterality: N/A;  . PACEMAKER INSERTION    . SHOULDER SURGERY Bilateral   . SKIN BIOPSY  05/04/2019   BASAL CELL CARCINOMA//MID CENTRAL NASAL    His Family History Is Significant For: Family History  Problem Relation Age of Onset  . Heart attack Father   . Kidney cancer Father   . Hypertension Mother   . Diabetes Mother   . Diabetes Brother   . Epilepsy Sister   . Diabetes Sister   . Diabetes Brother   . Colon cancer Neg Hx   . Rectal cancer Neg Hx   . Stomach cancer Neg Hx   . Esophageal cancer Neg Hx     His Social History Is Significant For: Social History   Socioeconomic History  . Marital status: Married    Spouse name: Not on file  . Number of children: 3  . Years of education: Not on file  . Highest education level: Not on file  Occupational History  . Occupation: retired  Scientific laboratory technician  . Financial resource strain: Not on file  . Food insecurity    Worry: Not on file    Inability: Not on file  . Transportation needs    Medical: Not on file    Non-medical: Not on file  Tobacco  Use  . Smoking status: Former Smoker    Quit date: 07/12/1973    Years since quitting: 45.9  . Smokeless tobacco: Never Used  Substance and Sexual Activity  . Alcohol use: No  . Drug use: No  . Sexual activity: Not on file  Lifestyle  . Physical activity    Days per week: Not on file    Minutes per session: Not on file  . Stress: Not on file  Relationships  . Social Herbalist on phone: Not on file    Gets together: Not on file    Attends religious service: Not on file    Active member of club or organization: Not on file    Attends meetings of clubs or organizations: Not on file    Relationship status: Not on file  Other Topics Concern  . Not on file  Social History Narrative   The patient is married and retired   He reports 3 grown children   Former smoker, no alcohol or tobacco or drug use at this time    His Allergies Are:  Allergies  Allergen Reactions  . Bee Venom Shortness Of Breath and Swelling  . Codeine Phosphate Hives, Shortness Of Breath, Itching, Rash and Other (See Comments)    Tolerated multiple doses of IV morphine  . Metoprolol Tartrate Other (See Comments)    loss of vision. Can take the XL tablet not regular   . Sinemet [Carbidopa W-Levodopa]   . Tramadol     Pt has hallucinations   . Ramipril Rash and Cough  :   His Current Medications Are:  Outpatient Encounter Medications as of 05/31/2019  Medication Sig  . ACCU-CHEK FASTCLIX LANCETS MISC USE TO CHECK BLOOD SUGAR TWICE A DAY AND AS NEEDED  . acetaminophen (TYLENOL ARTHRITIS PAIN) 650 MG CR tablet Take 1,300 mg by mouth 2 (two) times daily.  Marland Kitchen albuterol (VENTOLIN HFA) 108 (90 Base) MCG/ACT inhaler Inhale 2 puffs into the lungs every 6 (six) hours as needed for wheezing or shortness  of breath.  . Ascorbic Acid (VITAMIN C) 500 MG tablet Take 1,000 mg by mouth daily.   Marland Kitchen aspirin 81 MG tablet Take 81 mg by mouth every evening.   Marland Kitchen atorvastatin (LIPITOR) 40 MG tablet Take 1 tablet (40 mg  total) by mouth daily at 6 PM. Please make annual appt with Dr. Lovena Le for future refills. [phone].  . calcium carbonate (OS-CAL) 600 MG TABS Take 600 mg by mouth 2 (two) times daily with a meal.  . cetirizine (ZYRTEC) 10 MG tablet Take 10 mg by mouth daily.   . fluticasone (FLONASE) 50 MCG/ACT nasal spray Place 2 sprays into both nostrils daily.  . furosemide (LASIX) 40 MG tablet TAKE 1 TABLET TWICE DAILY  . Glucosamine-Chondroitin 250-200 MG CAPS Take 2 capsules by mouth daily.   Marland Kitchen glucose blood (ACCU-CHEK SMARTVIEW) test strip USE TO CHECK BLOOD SUGAR TWICE A DAY AND PRN  . guaiFENesin-dextromethorphan (ROBITUSSIN DM) 100-10 MG/5ML syrup Take 5 mLs by mouth every 4 (four) hours as needed for cough.  . losartan (COZAAR) 100 MG tablet Take 1 tablet (100 mg total) by mouth daily. Please make annual appt with Dr. Lovena Le for future refills. [phone].  . metFORMIN (GLUCOPHAGE) 1000 MG tablet TAKE 1 TABLET (1,000 MG TOTAL) BY MOUTH 2 (TWO) TIMES DAILY WITH A MEAL.  . metoprolol succinate (TOPROL-XL) 100 MG 24 hr tablet TAKE 1 TABLET EVERY DAY  . Misc Natural Products (TART CHERRY ADVANCED) CAPS Take 2 capsules by mouth daily.   . Multiple Vitamin (MULTIVITAMIN) tablet Take 1 tablet by mouth daily.    . nitroGLYCERIN (NITROSTAT) 0.4 MG SL tablet Place 1 tablet (0.4 mg total) under the tongue every 5 (five) minutes as needed. For chest pain  . Omega-3 Fatty Acids (FISH OIL) 1200 MG CAPS Take 1,200 mg by mouth daily.   . potassium chloride (K-DUR) 10 MEQ tablet Take 1 tablet (10 mEq total) by mouth 2 (two) times daily.  . Probiotic Product (PHILLIPS COLON HEALTH PO) Take 1 capsule by mouth daily.  Marland Kitchen rOPINIRole (REQUIP) 0.25 MG tablet Take 1 pill twice daily x 1 week, then 1 pill 3 times daily x 1 week, then 2 pills 3 times daily, for one week, then 3 pills 3 times daily thereafter  . vitamin B-12 (CYANOCOBALAMIN) 1000 MCG tablet Take 1,000 mcg by mouth daily.  Marland Kitchen warfarin (COUMADIN) 10 MG tablet  TAKE 1 TABLET DAILY OR AS DIRECTED BY COUMADIN CLINIC   No facility-administered encounter medications on file as of 05/31/2019.   :  Review of Systems:  Out of a complete 14 point review of systems, all are reviewed and negative with the exception of these symptoms as listed below: Review of Systems  Neurological:       Pt presents today to discuss his PD. Pt is doing well on the maintenance dose of requip.    Objective:  Neurological Exam  Physical Exam Physical Examination:   Vitals:   05/31/19 0936  BP: (!) 156/87  Pulse: 76    General Examination: The patient is a very pleasant 73 y.o. male in no acute distress. He appears well-developed and well-nourished and well groomed.   HEENT: Normocephalic, atraumatic, pupils are equal, round and reactive to light and accommodation. He has corrective eyeglasses, extraocular tracking is mildly impaired with saccadic eye movements noted, slight limitations of upgaze noted.  No nystagmus.  Face is symmetric with with moderate facial masking noted, he has moderate hypophonia.  He has Moderate nuchal rigidity.  His  mouth tends to stay open involuntarily at times.  He has no obvious sialorrhea.  Tongue protrudes centrally in palate elevates symmetrically.  There is mild mouth dryness noted.  Airway examination reveals moderate airway crowding. Hearing is mildly impaired. No obvious lip, neck or jaw tremor, no voice tremor.  No dysarthria.   Chest: Clear to auscultation without wheezing, rhonchi or crackles noted.  Heart: S1+S2+0, regular and normal without murmurs, rubs or gallops noted.   Abdomen: Soft, non-tender and non-distended with normal bowel sounds appreciated on auscultation.  Extremities: There is 1-2+ edema in the Lower extremities, he is wearing compression socks.   Skin: Warm and dry without trophic changes noted.  Musculoskeletal: exam reveals: Significant swelling in his hands in the knuckles, particularly right  middle finger, he has mild ulnar deviation on the right, arthritic changes in both hands and both knees, right knee swollen and tender.   Neurologically:  Mental status: The patient is awake, alert and oriented in all 4 spheres. His immediate and remote memory, attention, language skills and fund of knowledge are appropriate. There is no evidence of aphasia, agnosia, apraxia or anomia. Thought process is linear. Mood is normal and affect is normal.  Cranial nerves II - XII are as described above under HEENT exam. In addition: shoulder shrug is normal But at rest, right shoulder is higher than left.  (On 03/07/2019: On Archimedes spiral drawing, he has insecurity with the left hand which is his nondominant hand, slight insecurity noted with the right hand, handwriting is small, legible but slightly tremulous as well.)    He has a bilateral upper extremity resting tremor, right more consistent than left.  He has a slight postural tremor bilaterally, no significant action tremor, no intention tremor.  On fine motor testing, he has moderate difficulty with finger taps, hand movements and rapid alternating padding on the right side, slightly better on the left.  Foot taps and foot agility are moderately impaired on the right and slightly better on the left.  He has no lower extremity resting tremor.  He stands up with difficulty and has to push himself up, requires 2 attempta and no assistance needed.  He stands slightly wide-based, posture slightly stooped for age.  He walks with decreased arm swing, decreased stride length and decreased pace, arm swing nearly absent on the right side, slightly better on the left side.  His balance is impaired, noticeable with difficulty turning.  No obvious festination or freezing noted.  He did not bring a walking aid. Cerebellar testing: No dysmetria or intention tremor on finger to nose testing. Heel to shin Was not attempted secondary to knee pain bilaterally.  Sensory  exam reveals no obvious numbness, he has intact sensation to light touch throughout.   Assessment and Plan:  Assessment and Plan:  In summary, [name] is a very pleasant 73 year old male  with an underlying complex medical history of hypertension, hyperlipidemia, gout, diabetes, coronary artery disease, history of mitral valve regurgitation, aortic stenosis, carotid artery disease, spinal stenosis of the lumbar spine, status post lumbar spine surgery in September 2019 under Dr. Melvyn Novas, history of aortic valve replacement, status post cardiac pacemaker placement, status post CABG, knee arthritis bilaterally, status post lumbar laminectomy, and obesity, who presents for Follow-up consultation of his parkinsonism, right-sided predominant Parkinson's disease most likely. He could not tolerate Sinemet secondary to nausea and diarrhea.  He started taking Requip low-dose about a month ago, currently on 0.75 mg 3 times daily.  He tolerates it and feels that it has helped his tremor and dexterity as well as stiffness.  He has lower extremity swelling and usually uses compression Socks.  He is advised to monitor his swelling as Requip can make it worse, it can also be sedating.  It can in some instances also increase the INR as he is taking Coumadin.  He is advised to be mindful of this and have his INR checked on a regular basis.  I would like to increase the dose a little bit today to 1 mg strength 1 pill 3 times daily.  He is agreeable.  He is advised to stay well-hydrated, well rested, monitor for constipation and be proactive about it.  He is advised to stand up slowly and get his bearings first, use a cane as needed.  I would like to see him back in 4 months, sooner if needed.  He is advised to call or email through my chart for any interim questions or concerns.  I answered all the questions today and the patient and his wife were in agreement.

## 2019-05-31 NOTE — Patient Instructions (Signed)
Please make sure you get your INR checked on a regular basis.  Sometimes the Requip can increase your Coumadin effect and increase the INR.  As discussed, we will increase the Requip mildly to 1 mg strength tablets, take 1 pill 3 times a day.  That way, you do not have to take as many pills.  I am glad you have noticed some positive effects from taking the ropinirole.  Please continue to monitor your lower extremity swelling.  Ropinirole can cause order exacerbate swelling.  It can also make you sleepy, please be mindful of this.  Please try to hydrate well with water, stand up slowly, get your bearings first, use a cane if needed. Please monitor for anxiety, depression symptoms or mood irritability.  We can consider a low-dose antidepressant if needed down the road.  Please follow-up routinely in 4 months.

## 2019-06-01 NOTE — Progress Notes (Signed)
Remote pacemaker transmission.   

## 2019-06-08 IMAGING — DX DG SPINE 1V PORT
1 series · 1 of 1 positions shown · non-contrast
Comparison: 03/09/2018

CLINICAL DATA: Intraoperative radiograph.

EXAM:
PORTABLE SPINE - 1 VIEW

[l-spine lat]
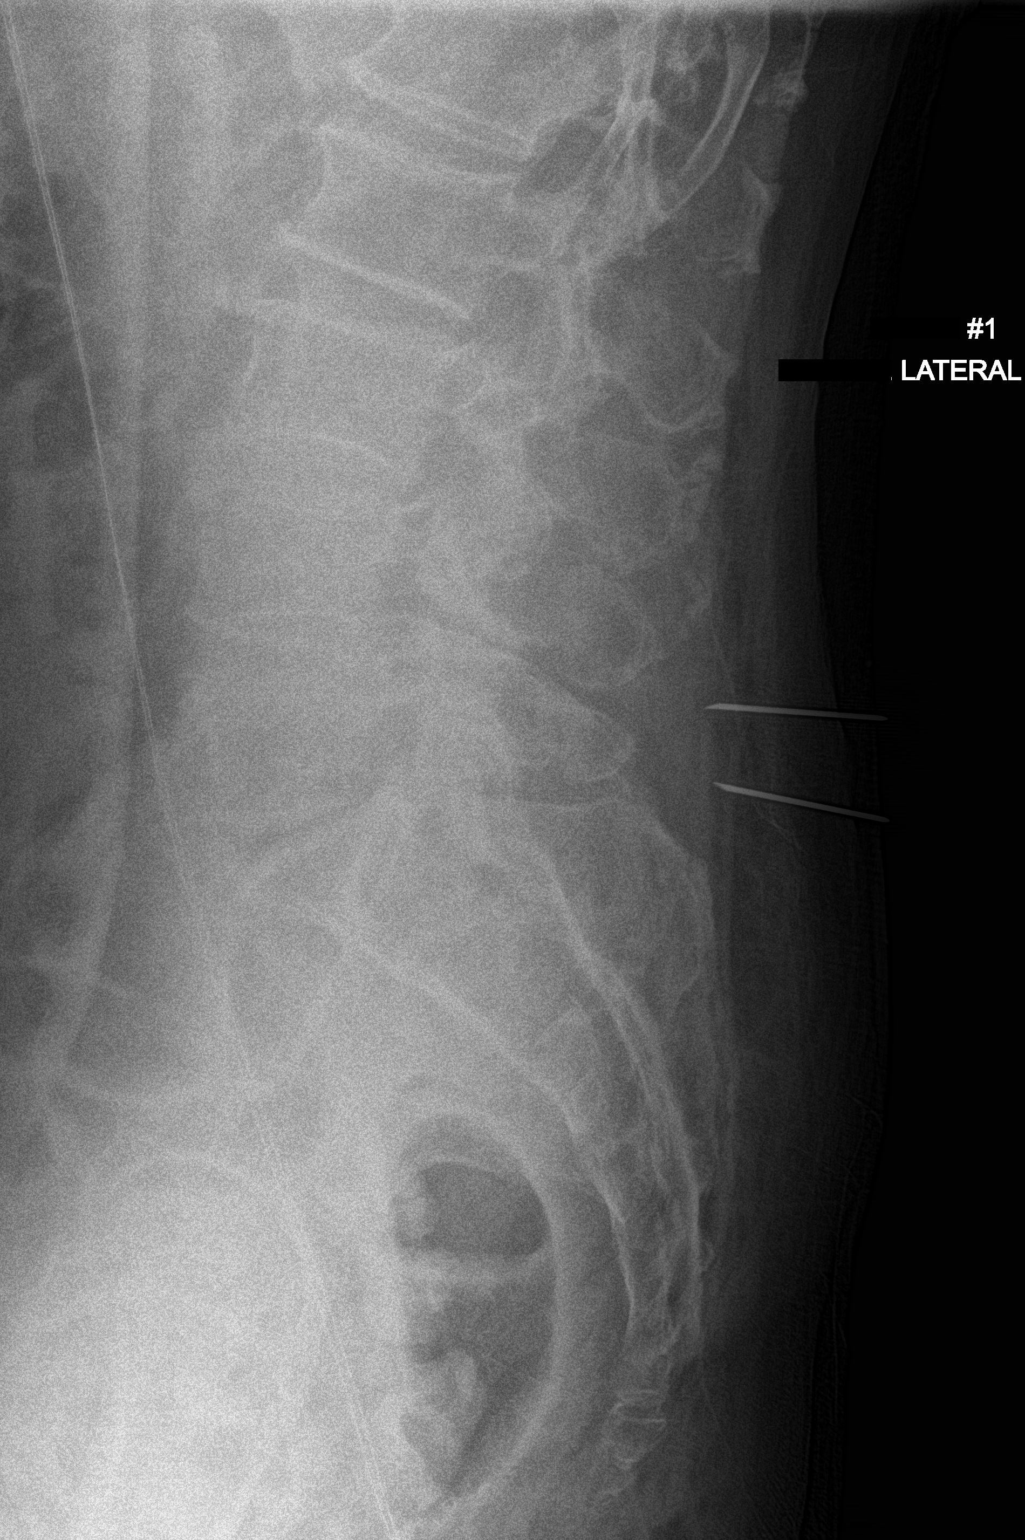

[1 of 1 positions shown; findings below may reference images not displayed]

FINDINGS: Single lateral low resolution radiograph of the lumbosacral spine
demonstrates placement of surgical instruments within the posterior
soft tissues at the level of inferior aspect of the spinal process
of L4 vertebral body and mid to inferior aspect of the spinous
process of L5 vertebral body.
IMPRESSION: Intraoperative radiograph for placement of surgical instruments.

## 2019-06-08 IMAGING — DX DG SPINE 1V PORT
1 series · 1 of 1 positions shown · non-contrast
Comparison: Intraoperative localization films earlier today.

CLINICAL DATA: Intraoperative localization film for patient
undergoing L4-5 decompression.

EXAM:
PORTABLE SPINE - 1 VIEW

[l-spine lat]
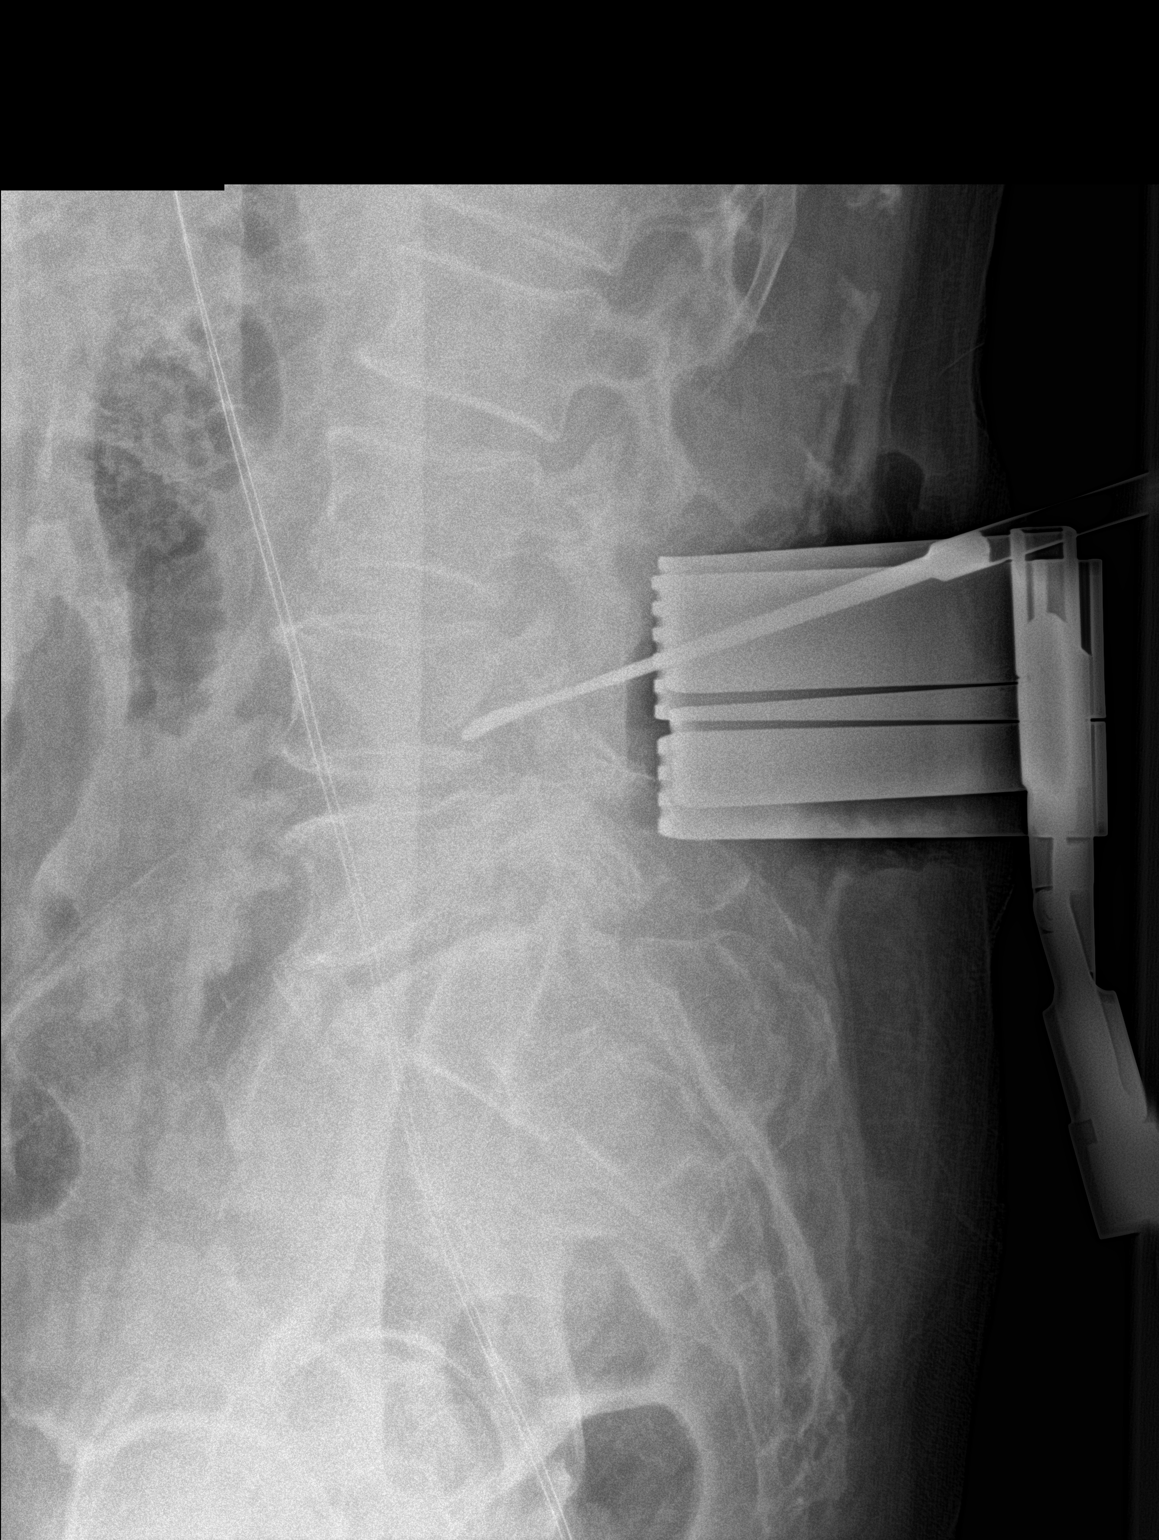

[1 of 1 positions shown; findings below may reference images not displayed]

FINDINGS: Single lateral view of the lumbar spine demonstrates clamps on the
L3 and L4 spinous processes. Probe tip projects at the level of the
inferior endplate of L4.
IMPRESSION: Localization as above.

## 2019-06-08 IMAGING — DX DG SPINE 1V PORT
1 series · 1 of 1 positions shown · non-contrast
Comparison: None.

CLINICAL DATA: Spinal surgery

EXAM:
PORTABLE SPINE - 1 VIEW

[l-spine lat]
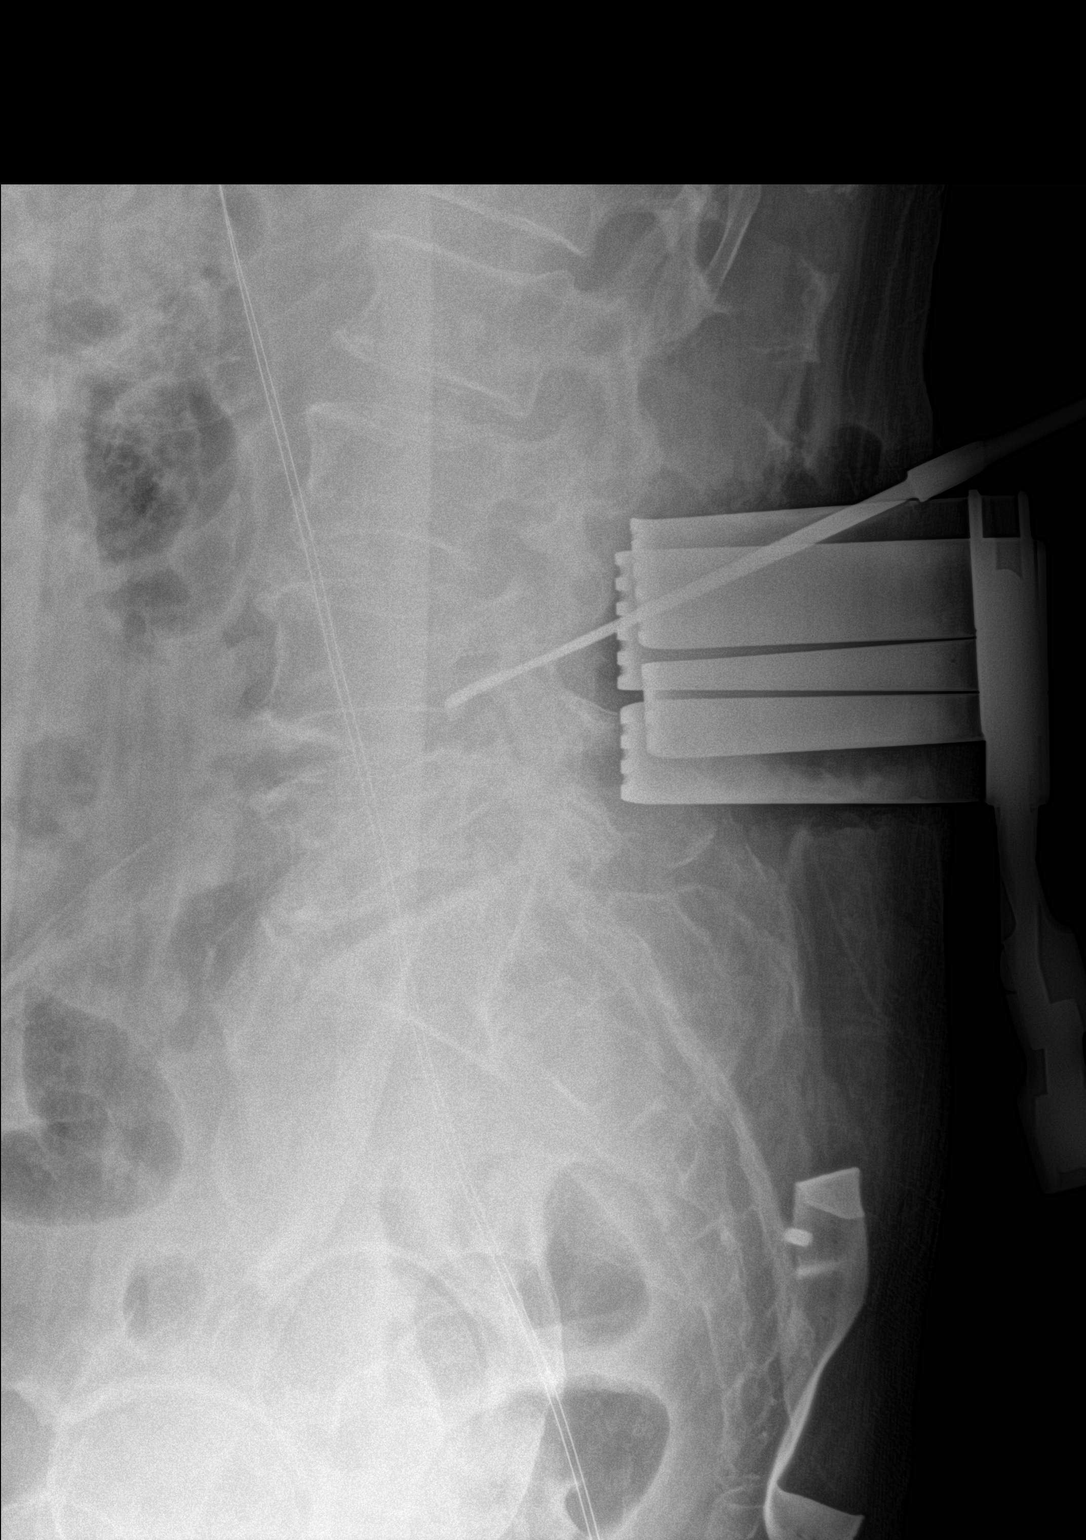

[1 of 1 positions shown; findings below may reference images not displayed]

FINDINGS: Single cross-table lateral x-ray of the lumbar spine is provided.
Posterior tissue retractors are present. Metallic probe with the tip
projecting over the posterior inferior corner of the L4 vertebral
body.
IMPRESSION: Intraoperative localization as described above.

## 2019-06-08 IMAGING — DX DG SPINE 1V PORT
1 series · 1 of 1 positions shown · non-contrast
Comparison: Intraoperative localization film earlier today.

CLINICAL DATA: Intraoperative localization film for patient
undergoing lumbar surgery..

EXAM:
PORTABLE SPINE - 1 VIEW

[l-spine lat]
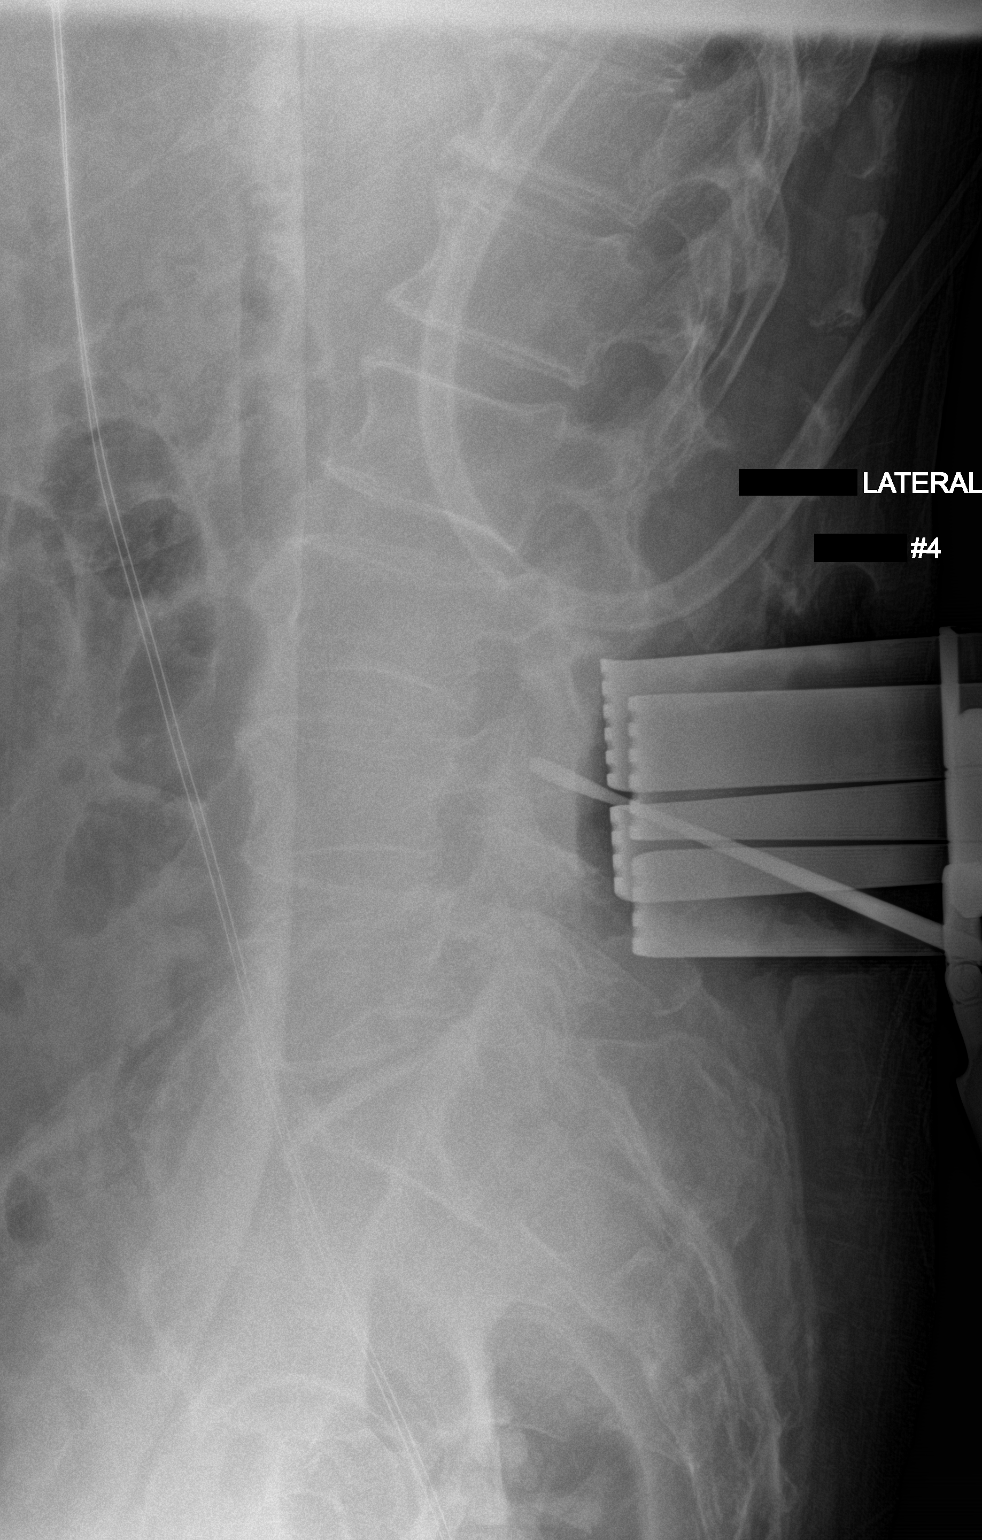

[1 of 1 positions shown; findings below may reference images not displayed]

FINDINGS: Metallic probe is at the level of the L4 pedicles.
IMPRESSION: As above.

## 2019-06-08 IMAGING — DX DG SPINE 1V PORT
1 series · 1 of 1 positions shown · non-contrast
Comparison: 03/15/2018

CLINICAL DATA: Intraoperative radiograph.

EXAM:
PORTABLE SPINE - 1 VIEW

[l-spine lat]
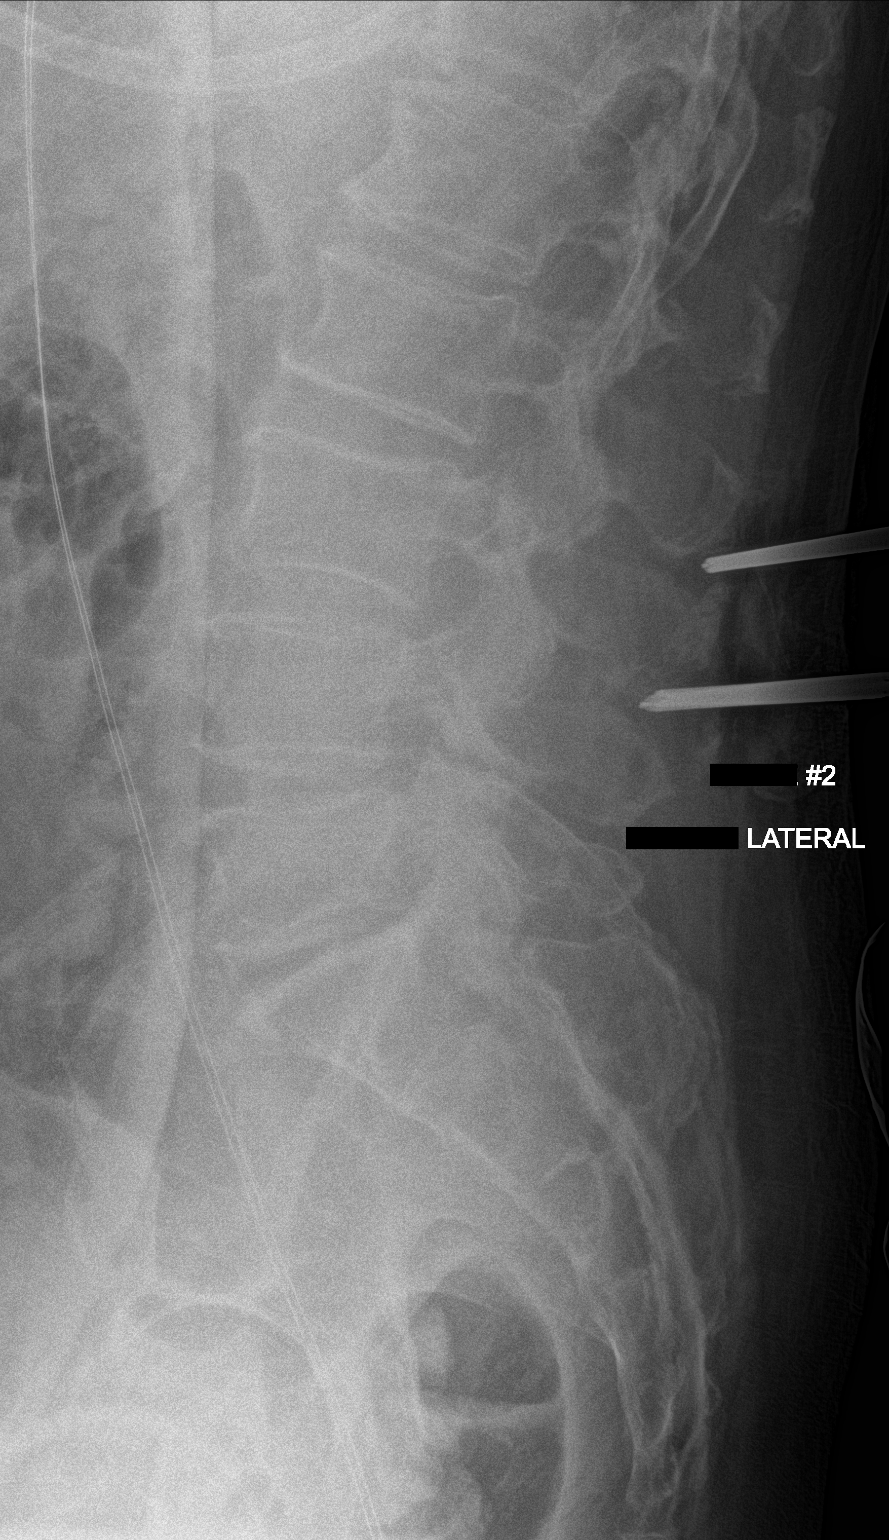

[1 of 1 positions shown; findings below may reference images not displayed]

FINDINGS: Single low resolution intraoperative lateral radiograph of the
lumbosacral spine demonstrates instruments within the posterior soft
tissues just inferior to the spinous processes of L2 and L3
vertebral bodies.
IMPRESSION: Surgical instruments within the posterior soft tissues just inferior
to the spinous processes of L2 and L3 vertebral bodies.

## 2019-06-18 ENCOUNTER — Telehealth: Payer: Self-pay | Admitting: Neurology

## 2019-06-18 NOTE — Telephone Encounter (Signed)
I called pt to discuss. No answer, left a message asking him to call me back. 

## 2019-06-18 NOTE — Telephone Encounter (Signed)
Pts wife Jeanett Schlein called in and stated that since the change in pts rOPINIRole (REQUIP) 1 MG tablet he has experience a lot of swelling and also has been sleeping a great amount.

## 2019-06-18 NOTE — Telephone Encounter (Signed)
I called wife back.  I relayed that she can cut tablets in 1/2 and give .5mg  po TID.  Call us in a week.  If swelling perpetually persists to seek pcp evaluation for other cause.  She verbalized understanding.  She will call back in a week to give [place] an update.

## 2019-06-18 NOTE — Telephone Encounter (Signed)
Please ask patient or patient's wife to reduce the Requip to half a pill 3 times a day (ie 0.5 mg tid).  Ropinirole can indeed cause swelling unless the swelling is from another cause.  It can also cause sleepiness.  Please have him update [place] in a week's time. In the meantime, if the swelling perpetually gets worse, he will have to see his primary care physician to rule out another cause of the swelling.

## 2019-06-18 NOTE — Telephone Encounter (Signed)
I spoke to wife of pt.  Pt started taking the ropinirole 1mg  po tid 06-01-19.  Noted more sleepiness, and feet and ankle swelling the last few days.  Compression stockings that he wears for r leg knee has not even helped.  Has been sleeping in recliner due to knee issues, has not made difference with swelling either.  Please advise.

## 2019-06-19 ENCOUNTER — Telehealth: Payer: Self-pay | Admitting: Internal Medicine

## 2019-06-19 NOTE — Telephone Encounter (Signed)
Response sent via mychart.

## 2019-06-19 NOTE — Telephone Encounter (Signed)
New message   Patient's wife wants to come to appt on 06/22/19 because the patient has Parkinson's disease.

## 2019-06-22 ENCOUNTER — Encounter: Payer: Self-pay | Admitting: Internal Medicine

## 2019-06-22 ENCOUNTER — Ambulatory Visit: Payer: Medicare HMO | Admitting: Internal Medicine

## 2019-06-22 ENCOUNTER — Other Ambulatory Visit: Payer: Self-pay

## 2019-06-22 VITALS — BP 136/84 | HR 82 | Ht 67.5 in | Wt 218.4 lb

## 2019-06-22 DIAGNOSIS — I442 Atrioventricular block, complete: Secondary | ICD-10-CM | POA: Diagnosis not present

## 2019-06-22 DIAGNOSIS — Z950 Presence of cardiac pacemaker: Secondary | ICD-10-CM | POA: Diagnosis not present

## 2019-06-22 DIAGNOSIS — I350 Nonrheumatic aortic (valve) stenosis: Secondary | ICD-10-CM

## 2019-06-22 DIAGNOSIS — I5032 Chronic diastolic (congestive) heart failure: Secondary | ICD-10-CM | POA: Insufficient documentation

## 2019-06-22 DIAGNOSIS — E119 Type 2 diabetes mellitus without complications: Secondary | ICD-10-CM | POA: Diagnosis not present

## 2019-06-22 LAB — CUP PACEART INCLINIC DEVICE CHECK
Battery Remaining Longevity: 129 mo
Battery Voltage: 2.99 V
Brady Statistic RA Percent Paced: 26 %
Brady Statistic RV Percent Paced: 99.8 %
Date Time Interrogation Session: 20201211145800
Implantable Lead Implant Date: 20050816
Implantable Lead Implant Date: 20050816
Implantable Lead Location: 753859
Implantable Lead Location: 753860
Implantable Pulse Generator Implant Date: 20171026
Lead Channel Impedance Value: 487.5 Ohm
Lead Channel Impedance Value: 737.5 Ohm
Lead Channel Pacing Threshold Amplitude: 0.5 V
Lead Channel Pacing Threshold Amplitude: 1.125 V
Lead Channel Pacing Threshold Pulse Width: 0.5 ms
Lead Channel Pacing Threshold Pulse Width: 0.5 ms
Lead Channel Sensing Intrinsic Amplitude: 3.1 mV
Lead Channel Setting Pacing Amplitude: 0.75 V
Lead Channel Setting Pacing Amplitude: 2.125
Lead Channel Setting Pacing Pulse Width: 0.5 ms
Lead Channel Setting Sensing Sensitivity: 4 mV
Pulse Gen Model: 2272
Pulse Gen Serial Number: 3182792

## 2019-06-22 NOTE — Progress Notes (Signed)
HPI [name] today for ongoing evaluation and management of complete heart block after aortic root and aortic valve replacement, status post pacemaker insertion. In the interim, he has been diagnosed with Parkinsons. His diabetes has been well-controlled.  He denies chest pain or shortness of breath. Minimal peripheral edema. He does have chronic knee pain and was considering knee replacement surgery until he was diagnosed with Parkinson's. He has not had syncope. Allergies  Allergen Reactions  . Bee Venom Shortness Of Breath and Swelling  . Codeine Phosphate Hives, Shortness Of Breath, Itching, Rash and Other (See Comments)    Tolerated multiple doses of IV morphine  . Metoprolol Tartrate Other (See Comments)    loss of vision. Can take the XL tablet not regular   . Sinemet [Carbidopa W-Levodopa]   . Tramadol     Pt has hallucinations   . Ramipril Rash and Cough     Current Outpatient Medications  Medication Sig Dispense Refill  . ACCU-CHEK FASTCLIX LANCETS MISC USE TO CHECK BLOOD SUGAR TWICE A DAY AND AS NEEDED 306 each 3  . acetaminophen (TYLENOL ARTHRITIS PAIN) 650 MG CR tablet Take 1,300 mg by mouth 2 (two) times daily.    Marland Kitchen albuterol (VENTOLIN HFA) 108 (90 Base) MCG/ACT inhaler Inhale 2 puffs into the lungs every 6 (six) hours as needed for wheezing or shortness of breath. 54 g 0  . Ascorbic Acid (VITAMIN C) 500 MG tablet Take 1,000 mg by mouth daily.     Marland Kitchen aspirin 81 MG tablet Take 81 mg by mouth every evening.     Marland Kitchen atorvastatin (LIPITOR) 40 MG tablet Take 1 tablet (40 mg total) by mouth daily at 6 PM. Please make annual appt with Dr. Lovena Le for future refills. [phone]. 90 tablet 0  . calcium carbonate (OS-CAL) 600 MG TABS Take 600 mg by mouth 2 (two) times daily with a meal.    . cetirizine (ZYRTEC) 10 MG tablet Take 10 mg by mouth daily.     . fluticasone (FLONASE) 50 MCG/ACT nasal spray Place 2 sprays into both nostrils daily. 16 g 6  . furosemide  (LASIX) 40 MG tablet TAKE 1 TABLET TWICE DAILY 180 tablet 3  . Glucosamine-Chondroitin 250-200 MG CAPS Take 2 capsules by mouth daily.     Marland Kitchen glucose blood (ACCU-CHEK SMARTVIEW) test strip USE TO CHECK BLOOD SUGAR TWICE A DAY AND PRN 300 each 3  . guaiFENesin-dextromethorphan (ROBITUSSIN DM) 100-10 MG/5ML syrup Take 5 mLs by mouth every 4 (four) hours as needed for cough.    . losartan (COZAAR) 100 MG tablet Take 1 tablet (100 mg total) by mouth daily. Please make annual appt with Dr. Lovena Le for future refills. [phone]. 90 tablet 0  . metFORMIN (GLUCOPHAGE) 1000 MG tablet TAKE 1 TABLET (1,000 MG TOTAL) BY MOUTH 2 (TWO) TIMES DAILY WITH A MEAL. 180 tablet 0  . metoprolol succinate (TOPROL-XL) 100 MG 24 hr tablet TAKE 1 TABLET EVERY DAY 90 tablet 3  . Misc Natural Products (TART CHERRY ADVANCED) CAPS Take 2 capsules by mouth daily.     . Multiple Vitamin (MULTIVITAMIN) tablet Take 1 tablet by mouth daily.      . nitroGLYCERIN (NITROSTAT) 0.4 MG SL tablet Place 1 tablet (0.4 mg total) under the tongue every 5 (five) minutes as needed. For chest pain 25 tablet 3  . Omega-3 Fatty Acids (FISH OIL) 1200 MG CAPS Take 1,200 mg by mouth daily.     . potassium chloride (K-DUR) 10  MEQ tablet Take 1 tablet (10 mEq total) by mouth 2 (two) times daily. 180 tablet 3  . Probiotic Product (PHILLIPS COLON HEALTH PO) Take 1 capsule by mouth daily.    Marland Kitchen rOPINIRole (REQUIP) 1 MG tablet Take 0.5 mg by mouth 3 (three) times daily.    . vitamin B-12 (CYANOCOBALAMIN) 1000 MCG tablet Take 1,000 mcg by mouth daily.    Marland Kitchen warfarin (COUMADIN) 10 MG tablet TAKE 1 TABLET DAILY OR AS DIRECTED BY COUMADIN CLINIC 90 tablet 1   No current facility-administered medications for this visit.     Past Medical History:  Diagnosis Date  . Acute renal failure ([place]) 02/09/2013  . Aortic stenosis 08/25/2010   a. [place] aortic root replacement with a St. Jude mechanical valve and Hemashield conduit 02/2004.  Marland Kitchen Arthritis   . Asthma   .  AV BLOCK, COMPLETE    a. s/p St Jude dual chamber pacemaker 02/2004.  Marland Kitchen CAD (coronary artery disease)    a. s/p CABGx2 (LIMA-dLAD, SVG-Cx). b. Low risk nuc 12/2012 without ischemia, EF 46% mild apical hypokinesia (EF 55% inf HK by echo).  . Carotid artery disease ([place])    a. 0-39% bilateral ICA stenosis, stable mild hard plaque in carotid bulbs. F/u 03/2014 recommended.  . Diabetes mellitus without complication ([place])    type 2   . DIVERTICULOSIS, COLON 10/17/2007  . Ejection fraction    a. EF 55% with inf HK, mild MR by echo 12/2012.  Marland Kitchen GERD (gastroesophageal reflux disease)    hxof   . GOUT 01/16/2007  . Headache    hx of migraines   . HEMORRHOIDS, INTERNAL 11/01/2008  . Hx of adenomatous polyp of colon 02/2019   no recall needed  . HYPERLIPIDEMIA 01/16/2007  . HYPERTENSION 01/16/2007  . Impaired glucose tolerance   . LEG EDEMA 11/27/2008  . Special screening for malignant neoplasm of prostate   . Warfarin anticoagulation    AVR    ROS:   All systems reviewed and negative except as noted in the HPI.   Past Surgical History:  Procedure Laterality Date  . AORTIC VALVE REPLACEMENT    . CARDIAC CATHETERIZATION  06/2003  . CIRCUMCISION    . COLONOSCOPY    . CORONARY ARTERY BYPASS GRAFT    . coronary stents      prior to bypass   . EP IMPLANTABLE DEVICE N/A 05/06/2016   Procedure: PPM Generator Changeout;  Surgeon: Evans Lance, MD;  Location: [facility];  Service: Cardiovascular;  Laterality: N/A;  . ESOPHAGOGASTRODUODENOSCOPY    . HERNIA REPAIR    . INGUINAL HERNIA REPAIR Bilateral 02/07/2013   Procedure: LAPAROSCOPIC BILATERAL INGUINAL HERNIA REPAIR;  Surgeon: Imogene Burn. Georgette Dover, MD;  Location: [facility];  Service: General;  Laterality: Bilateral;  . INSERTION OF MESH Bilateral 02/07/2013   Procedure: INSERTION OF MESH;  Surgeon: Imogene Burn. Georgette Dover, MD;  Location: [facility];  Service: General;  Laterality: Bilateral;  . LUMBAR LAMINECTOMY/DECOMPRESSION MICRODISCECTOMY N/A 03/15/2018    Procedure: Complete decompressive lumbar laminectomy for spinal stenosis of L4-L5 and foraminotomy for L4-L5 root on right;  Surgeon: [name], MD;  Location: [facility];  Service: Orthopedics;  Laterality: N/A;  . PACEMAKER INSERTION    . SHOULDER SURGERY Bilateral   . SKIN BIOPSY  05/04/2019   BASAL CELL CARCINOMA//MID CENTRAL NASAL     Family History  Problem Relation Age of Onset  . Heart attack Father   . Kidney cancer Father   . Hypertension Mother   .  Diabetes Mother   . Diabetes Brother   . Epilepsy Sister   . Diabetes Sister   . Diabetes Brother   . Colon cancer Neg Hx   . Rectal cancer Neg Hx   . Stomach cancer Neg Hx   . Esophageal cancer Neg Hx      Social History   Socioeconomic History  . Marital status: Married    Spouse name: Not on file  . Number of children: 3  . Years of education: Not on file  . Highest education level: Not on file  Occupational History  . Occupation: retired  Tobacco Use  . Smoking status: Former Smoker    Quit date: 07/12/1973    Years since quitting: 45.9  . Smokeless tobacco: Never Used  Substance and Sexual Activity  . Alcohol use: No  . Drug use: No  . Sexual activity: Not on file  Other Topics Concern  . Not on file  Social History Narrative   The patient is married and retired   He reports 3 grown children   Former smoker, no alcohol or tobacco or drug use at this time   Social Determinants of Radio broadcast assistant Strain:   . Difficulty of Paying Living Expenses: Not on file  Food Insecurity:   . Worried About Charity fundraiser in the Last Year: Not on file  . Ran Out of Food in the Last Year: Not on file  Transportation Needs:   . Lack of Transportation (Medical): Not on file  . Lack of Transportation (Non-Medical): Not on file  Physical Activity:   . Days of Exercise per Week: Not on file  . Minutes of Exercise per Session: Not on file  Stress:   . Feeling of Stress : Not on file  Social  Connections:   . Frequency of Communication with Friends and Family: Not on file  . Frequency of Social Gatherings with Friends and Family: Not on file  . Attends Religious Services: Not on file  . Active Member of Clubs or Organizations: Not on file  . Attends Archivist Meetings: Not on file  . Marital Status: Not on file  Intimate Partner Violence:   . Fear of Current or Ex-Partner: Not on file  . Emotionally Abused: Not on file  . Physically Abused: Not on file  . Sexually Abused: Not on file     BP 136/84   Pulse 82   Ht 5' 7.5" (1.715 m)   Wt 218 lb 6.4 oz (99.1 kg)   SpO2 94%   BMI 33.70 kg/m   Physical Exam:  Well appearing NAD HEENT: Unremarkable Neck:  No JVD, no thyromegally Lymphatics:  No adenopathy Back:  No CVA tenderness Lungs:  Clear with no wheezes HEART:  Regular rate rhythm, no murmurs, no rubs, no clicks Abd:  soft, positive bowel sounds, no organomegally, no rebound, no guarding Ext:  2 plus pulses, no edema, no cyanosis, no clubbing; persistent tremor Skin:  No rashes no nodules Neuro:  CN II through XII intact, motor grossly intact  DEVICE  Normal device function.  See PaceArt for details.   Assess/Plan: 1. CHB - he is asymptomatic, s/p PPM insertion. 2. PPM - his St. Jude DDD PM is working normally. We will recheck in several months. 3. HTN - his sbp is up a bit. He will maintain a low sodium diet. With his propensity for syncope due to parkinson's I would not want to drive his bp too  low.  Mikle Bosworth.D.

## 2019-06-22 NOTE — Patient Instructions (Signed)

## 2019-06-25 NOTE — Telephone Encounter (Signed)
I would recommend he continue taking the ropinirole at the current/lower dose.

## 2019-06-25 NOTE — Telephone Encounter (Signed)
Pts wife [name] returned call and stated pt is doing a lot better and the swelling has went down since the med change.

## 2019-06-25 NOTE — Telephone Encounter (Signed)
I called pt, spoke to pt's wife, and discussed this with her. Pt will continue the lower dose of ropinirole.

## 2019-07-16 ENCOUNTER — Other Ambulatory Visit: Payer: Self-pay

## 2019-07-16 ENCOUNTER — Ambulatory Visit (INDEPENDENT_AMBULATORY_CARE_PROVIDER_SITE_OTHER): Payer: Medicare HMO

## 2019-07-16 DIAGNOSIS — Z952 Presence of prosthetic heart valve: Secondary | ICD-10-CM | POA: Diagnosis not present

## 2019-07-16 DIAGNOSIS — Z09 Encounter for follow-up examination after completed treatment for conditions other than malignant neoplasm: Secondary | ICD-10-CM | POA: Diagnosis not present

## 2019-07-16 DIAGNOSIS — Z5181 Encounter for therapeutic drug level monitoring: Secondary | ICD-10-CM | POA: Diagnosis not present

## 2019-07-16 LAB — POCT INR: INR: 3.2 — AB (ref 2.0–3.0)

## 2019-07-16 NOTE — Patient Instructions (Signed)
Description   Take 1/2 tablet today, then resume same dosage 1 tablet every day. Recheck in 6 weeks. Call with any changes. # [phone] [id number].

## 2019-07-18 ENCOUNTER — Encounter: Payer: Self-pay | Admitting: Adult Health

## 2019-07-18 ENCOUNTER — Other Ambulatory Visit: Payer: Self-pay | Admitting: Internal Medicine

## 2019-07-20 MED ORDER — METFORMIN HCL 1000 MG PO TABS: 1000.0000 mg | ORAL_TABLET | Freq: Two times a day (BID) | ORAL | 0 refills | Status: DC

## 2019-07-23 ENCOUNTER — Other Ambulatory Visit: Payer: Self-pay

## 2019-07-23 MED ORDER — LOSARTAN POTASSIUM 100 MG PO TABS: 100.0000 mg | ORAL_TABLET | Freq: Every day | ORAL | 3 refills | Status: DC

## 2019-07-27 DIAGNOSIS — M13861 Other specified arthritis, right knee: Secondary | ICD-10-CM | POA: Diagnosis not present

## 2019-07-27 DIAGNOSIS — M13862 Other specified arthritis, left knee: Secondary | ICD-10-CM | POA: Diagnosis not present

## 2019-08-02 DIAGNOSIS — M7542 Impingement syndrome of left shoulder: Secondary | ICD-10-CM | POA: Diagnosis not present

## 2019-08-02 DIAGNOSIS — M25512 Pain in left shoulder: Secondary | ICD-10-CM | POA: Diagnosis not present

## 2019-08-09 ENCOUNTER — Ambulatory Visit (INDEPENDENT_AMBULATORY_CARE_PROVIDER_SITE_OTHER): Payer: Medicare HMO | Admitting: *Deleted

## 2019-08-09 DIAGNOSIS — I442 Atrioventricular block, complete: Secondary | ICD-10-CM

## 2019-08-09 LAB — CUP PACEART REMOTE DEVICE CHECK
Battery Remaining Longevity: 129 mo
Battery Remaining Percentage: 95.5 %
Battery Voltage: 2.98 V
Brady Statistic AP VP Percent: 26 %
Brady Statistic AP VS Percent: 1 %
Brady Statistic AS VP Percent: 73 %
Brady Statistic AS VS Percent: 1 %
Brady Statistic RA Percent Paced: 26 %
Brady Statistic RV Percent Paced: 99 %
Date Time Interrogation Session: 20210127074514
Implantable Lead Implant Date: 20050816
Implantable Lead Implant Date: 20050816
Implantable Lead Location: 753859
Implantable Lead Location: 753860
Implantable Pulse Generator Implant Date: 20171026
Lead Channel Impedance Value: 490 Ohm
Lead Channel Impedance Value: 780 Ohm
Lead Channel Pacing Threshold Amplitude: 0.5 V
Lead Channel Pacing Threshold Amplitude: 1 V
Lead Channel Pacing Threshold Pulse Width: 0.5 ms
Lead Channel Pacing Threshold Pulse Width: 0.5 ms
Lead Channel Sensing Intrinsic Amplitude: 12 mV
Lead Channel Sensing Intrinsic Amplitude: 3.1 mV
Lead Channel Setting Pacing Amplitude: 0.75 V
Lead Channel Setting Pacing Amplitude: 2 V
Lead Channel Setting Pacing Pulse Width: 0.5 ms
Lead Channel Setting Sensing Sensitivity: 4 mV
Pulse Gen Model: 2272
Pulse Gen Serial Number: 3182792

## 2019-08-09 NOTE — Progress Notes (Signed)
PPM Remote  

## 2019-08-15 ENCOUNTER — Other Ambulatory Visit: Payer: Self-pay | Admitting: Cardiology

## 2019-08-15 ENCOUNTER — Other Ambulatory Visit: Payer: Self-pay | Admitting: Adult Health

## 2019-08-27 ENCOUNTER — Other Ambulatory Visit: Payer: Self-pay

## 2019-08-27 ENCOUNTER — Ambulatory Visit (INDEPENDENT_AMBULATORY_CARE_PROVIDER_SITE_OTHER): Payer: Medicare HMO | Admitting: *Deleted

## 2019-08-27 DIAGNOSIS — Z5181 Encounter for therapeutic drug level monitoring: Secondary | ICD-10-CM | POA: Diagnosis not present

## 2019-08-27 DIAGNOSIS — Z09 Encounter for follow-up examination after completed treatment for conditions other than malignant neoplasm: Secondary | ICD-10-CM | POA: Diagnosis not present

## 2019-08-27 DIAGNOSIS — Z952 Presence of prosthetic heart valve: Secondary | ICD-10-CM | POA: Diagnosis not present

## 2019-08-27 LAB — POCT INR: INR: 3.1 — AB (ref 2.0–3.0)

## 2019-08-27 NOTE — Patient Instructions (Signed)
Description   Take 1/2 tablet today, then start taking 1 tablet every day except 1/2 tablet on Sundays. Recheck in 4 weeks. Call with any changes. # [phone] [id number].

## 2019-09-24 ENCOUNTER — Other Ambulatory Visit: Payer: Self-pay

## 2019-09-24 ENCOUNTER — Ambulatory Visit (INDEPENDENT_AMBULATORY_CARE_PROVIDER_SITE_OTHER): Payer: Medicare HMO | Admitting: *Deleted

## 2019-09-24 DIAGNOSIS — Z952 Presence of prosthetic heart valve: Secondary | ICD-10-CM | POA: Diagnosis not present

## 2019-09-24 DIAGNOSIS — Z5181 Encounter for therapeutic drug level monitoring: Secondary | ICD-10-CM

## 2019-09-24 DIAGNOSIS — Z09 Encounter for follow-up examination after completed treatment for conditions other than malignant neoplasm: Secondary | ICD-10-CM | POA: Diagnosis not present

## 2019-09-24 LAB — POCT INR: INR: 3.6 — AB (ref 2.0–3.0)

## 2019-09-24 NOTE — Patient Instructions (Signed)
Description   Hold today the continue to take 1 tablet every day except 1/2 tablet on Sundays. Recheck in 3 weeks. Call with any changes. # [phone] [id number].

## 2019-09-29 ENCOUNTER — Encounter: Payer: Self-pay | Admitting: Adult Health

## 2019-09-29 ENCOUNTER — Other Ambulatory Visit: Payer: Self-pay | Admitting: Adult Health

## 2019-10-01 ENCOUNTER — Encounter: Payer: Self-pay | Admitting: Neurology

## 2019-10-01 ENCOUNTER — Other Ambulatory Visit: Payer: Self-pay

## 2019-10-01 ENCOUNTER — Ambulatory Visit: Payer: Medicare HMO | Admitting: Neurology

## 2019-10-01 VITALS — BP 147/85 | HR 74 | Temp 97.4°F | Ht 67.0 in | Wt 205.0 lb

## 2019-10-01 DIAGNOSIS — G20 Parkinson's disease: Secondary | ICD-10-CM

## 2019-10-01 MED ORDER — ACCU-CHEK FASTCLIX LANCETS MISC: 0 refills | Status: DC

## 2019-10-01 MED ORDER — ROTIGOTINE 2 MG/24HR TD PT24: 1.0000 | MEDICATED_PATCH | Freq: Every day | TRANSDERMAL | 5 refills | Status: DC

## 2019-10-01 MED ORDER — ACCU-CHEK SMARTVIEW VI STRP: ORAL_STRIP | 0 refills | Status: DC

## 2019-10-01 NOTE — Progress Notes (Signed)
Subjective:    Patient ID: [name] is a 74 y.o. male.  HPI     Interim history:   [name] is a 74 year old right-handed gentleman with an underlying complex medical history of hypertension, hyperlipidemia, gout, diabetes, coronary artery disease, history of mitral valve regurgitation, aortic stenosis, carotid artery disease, spinal stenosis of the lumbar spine, status post lumbar spine surgery in September 2019 under Dr. Melvyn Novas, history of aortic valve replacement, status post cardiac pacemaker placement, status post CABG, knee arthritis bilaterally, status post lumbar laminectomy, and obesity, who presents for follow-up consultation of his parkinsonism, likely right-sided predominant Parkinson's disease.  The patient is accompanied by his wife again today. I last saw him on 05/31/2019, at which time he reported that he was able to take the ropinirole and saw some benefit.  I suggested we increase his dose from 0.75 mg 3 times daily to 1 mg pills 1 pill 3 times daily.   His wife called in early December reporting that he was more sleepy during the day and that he had more ankle swelling.  I suggested we reduce the ropinirole to half a pill 3 times daily, essentially 0.5 mg 3 times daily.  Today, 10/01/19: He reports that he is still shaking.  His wife reports that he had a fall a month or 2 ago on the deck.  He had gotten up at night as the dog needed to go out, he did not realize the ground was slightly frozen and slight and fell on the deck and skinned his left elbow.  He was checked out by his doctor, he also hurt his left shoulder, he had an x-ray through orthopedics and also had an injection into the left elbow in the interim.  He sleeps in the recliner, he has ongoing issues with bilateral knee pain and swelling.  He has vivid dreams at times and sometimes wakes up confused, some hallucinations are reported by his wife.  He has had some dream enactment behavior, knocked of the table  lab and some CDs are DVDs off the table that is next to the recliner.  Since the reduction in the Requip his swelling has gone down and sleepiness is less, she also believes that he had some nausea with it.  However, on the lower dose he does not see much benefit from it. He is not planning to take the Covid vaccine.  The patient's allergies, current medications, family history, past medical history, past social history, past surgical history and problem list were reviewed and updated as appropriate.    Previously:   I first met him on 03/07/2019 at the request of his primary care provider, at which time he reported an at least 1 year history of hand tremor.  Examination revealed parkinsonism with right-sided predominance.  He was advised to start a trial of Sinemet.  We talked about diagnostic testing including a DaTscan.  He has a pacemaker and cannot have an MRI.   His wife called in September reporting that he had nausea and diarrhea.  He was advised to taper off the Sinemet.   He reported back via MyChart message in October that his nausea and diarrhea subsided after he tapered off the Sinemet.  He requested advise regarding another medication.  I suggested we start him on Requip.  He declined and wanted to wait till his appointment.    03/07/2019: (He) reports a b/l hand tremor for the past year, started after his back surgery and first noticed  in the right hand.  He has had some balance issues as well, and further asking them, it is possible that he has had balance problems for the past 3+ years, maybe even 5 years.  His wife has noted a gradual slowing down and difficulty with fine motor skills.  He reports a family history of Parkinson's disease.  His brother died at 3 and had Parkinson's disease as well as complications of diabetes does understand.  He is 1 of a total of 13 children, this includes two stepchildren, one half brother and 3 half-sisters he reports.  No other sibling with  Parkinson's disease.  He reports a fall about 2 weeks ago, he feels like he tripped over his own feet.  He fell on his left side and bruised his face but also his left rib cage and left knee.  He did not seek medical attention at the time.  He has ongoing issues with bilateral knee pain, his right ankle is swollen, he has arthritis in both hands as well.  He has never seen a rheumatologist.  He sleeps in a recliner.  His wife does report that he is sleepy during the day.  He snores a little bit according to her.  She also endorses that he has significant constipation issues, may go 2 or even 3 days without a bowel movement.  He has been trying to eat prunes every day which has helped and she is giving him Dulcolax as needed.  He tries to hydrate well with water. I reviewed your office note from 02/28/2019.  He has received bilateral cortisone injections into the knees.  He has had falls.  He has had some gait changes including shuffling.  You noticed a tremor.  He had repeat knee steroid injections on 02/28/2019. He has a cane and a walker but does not typically use any 1 of them on a consistent basis.  He did not bring a walking aid today.   His Past Medical History Is Significant For: Past Medical History:  Diagnosis Date  . Acute renal failure ([place]) 02/09/2013  . Aortic stenosis 08/25/2010   a. [place] aortic root replacement with a St. Jude mechanical valve and Hemashield conduit 02/2004.  Marland Kitchen Arthritis   . Asthma   . AV BLOCK, COMPLETE    a. s/p St Jude dual chamber pacemaker 02/2004.  Marland Kitchen CAD (coronary artery disease)    a. s/p CABGx2 (LIMA-dLAD, SVG-Cx). b. Low risk nuc 12/2012 without ischemia, EF 46% mild apical hypokinesia (EF 55% inf HK by echo).  . Carotid artery disease ([place])    a. 0-39% bilateral ICA stenosis, stable mild hard plaque in carotid bulbs. F/u 03/2014 recommended.  . Diabetes mellitus without complication ([place])    type 2   . DIVERTICULOSIS, COLON 10/17/2007  . Ejection fraction    a.  EF 55% with inf HK, mild MR by echo 12/2012.  Marland Kitchen GERD (gastroesophageal reflux disease)    hxof   . GOUT 01/16/2007  . Headache    hx of migraines   . HEMORRHOIDS, INTERNAL 11/01/2008  . Hx of adenomatous polyp of colon 02/2019   no recall needed  . HYPERLIPIDEMIA 01/16/2007  . HYPERTENSION 01/16/2007  . Impaired glucose tolerance   . LEG EDEMA 11/27/2008  . Special screening for malignant neoplasm of prostate   . Warfarin anticoagulation    AVR    His Past Surgical History Is Significant For: Past Surgical History:  Procedure Laterality Date  . AORTIC VALVE REPLACEMENT    .  CARDIAC CATHETERIZATION  06/2003  . CIRCUMCISION    . COLONOSCOPY    . CORONARY ARTERY BYPASS GRAFT    . coronary stents      prior to bypass   . EP IMPLANTABLE DEVICE N/A 05/06/2016   Procedure: PPM Generator Changeout;  Surgeon: Evans Lance, MD;  Location: [facility];  Service: Cardiovascular;  Laterality: N/A;  . ESOPHAGOGASTRODUODENOSCOPY    . HERNIA REPAIR    . INGUINAL HERNIA REPAIR Bilateral 02/07/2013   Procedure: LAPAROSCOPIC BILATERAL INGUINAL HERNIA REPAIR;  Surgeon: Imogene Burn. Georgette Dover, MD;  Location: [facility];  Service: General;  Laterality: Bilateral;  . INSERTION OF MESH Bilateral 02/07/2013   Procedure: INSERTION OF MESH;  Surgeon: Imogene Burn. Georgette Dover, MD;  Location: [facility];  Service: General;  Laterality: Bilateral;  . LUMBAR LAMINECTOMY/DECOMPRESSION MICRODISCECTOMY N/A 03/15/2018   Procedure: Complete decompressive lumbar laminectomy for spinal stenosis of L4-L5 and foraminotomy for L4-L5 root on right;  Surgeon: [name], MD;  Location: [facility];  Service: Orthopedics;  Laterality: N/A;  . PACEMAKER INSERTION    . SHOULDER SURGERY Bilateral   . SKIN BIOPSY  05/04/2019   BASAL CELL CARCINOMA//MID CENTRAL NASAL    His Family History Is Significant For: Family History  Problem Relation Age of Onset  . Heart attack Father   . Kidney cancer Father   . Hypertension Mother   . Diabetes  Mother   . Diabetes Brother   . Epilepsy Sister   . Diabetes Sister   . Diabetes Brother   . Colon cancer Neg Hx   . Rectal cancer Neg Hx   . Stomach cancer Neg Hx   . Esophageal cancer Neg Hx     His Social History Is Significant For: Social History   Socioeconomic History  . Marital status: Married    Spouse name: Not on file  . Number of children: 3  . Years of education: Not on file  . Highest education level: Not on file  Occupational History  . Occupation: retired  Tobacco Use  . Smoking status: Former Smoker    Quit date: 07/12/1973    Years since quitting: 46.2  . Smokeless tobacco: Never Used  Substance and Sexual Activity  . Alcohol use: No  . Drug use: No  . Sexual activity: Not on file  Other Topics Concern  . Not on file  Social History Narrative   The patient is married and retired   He reports 3 grown children   Former smoker, no alcohol or tobacco or drug use at this time   Social Determinants of Radio broadcast assistant Strain:   . Difficulty of Paying Living Expenses:   Food Insecurity:   . Worried About Charity fundraiser in the Last Year:   . Arboriculturist in the Last Year:   Transportation Needs:   . Film/video editor (Medical):   Marland Kitchen Lack of Transportation (Non-Medical):   Physical Activity:   . Days of Exercise per Week:   . Minutes of Exercise per Session:   Stress:   . Feeling of Stress :   Social Connections:   . Frequency of Communication with Friends and Family:   . Frequency of Social Gatherings with Friends and Family:   . Attends Religious Services:   . Active Member of Clubs or Organizations:   . Attends Archivist Meetings:   Marland Kitchen Marital Status:     His Allergies Are:  Allergies  Allergen Reactions  .  Bee Venom Shortness Of Breath and Swelling  . Codeine Phosphate Hives, Shortness Of Breath, Itching, Rash and Other (See Comments)    Tolerated multiple doses of IV morphine  . Metoprolol Tartrate Other  (See Comments)    loss of vision. Can take the XL tablet not regular   . Sinemet [Carbidopa W-Levodopa]   . Tramadol     Pt has hallucinations   . Ramipril Rash and Cough  :   His Current Medications Are:  Outpatient Encounter Medications as of 10/01/2019  Medication Sig  . ACCU-CHEK FASTCLIX LANCETS MISC USE TO CHECK BLOOD SUGAR TWICE A DAY AND AS NEEDED  . acetaminophen (TYLENOL ARTHRITIS PAIN) 650 MG CR tablet Take 1,300 mg by mouth 2 (two) times daily.  Marland Kitchen albuterol (VENTOLIN HFA) 108 (90 Base) MCG/ACT inhaler Inhale 2 puffs into the lungs every 6 (six) hours as needed for wheezing or shortness of breath.  . Ascorbic Acid (VITAMIN C) 500 MG tablet Take 1,000 mg by mouth daily.   Marland Kitchen aspirin 81 MG tablet Take 81 mg by mouth every evening.   Marland Kitchen atorvastatin (LIPITOR) 40 MG tablet Take 1 tablet (40 mg total) by mouth daily at 6 PM.  . calcium carbonate (OS-CAL) 600 MG TABS Take 600 mg by mouth 2 (two) times daily with a meal.  . cetirizine (ZYRTEC) 10 MG tablet Take 10 mg by mouth daily.   . fluticasone (FLONASE) 50 MCG/ACT nasal spray Place 2 sprays into both nostrils daily.  . furosemide (LASIX) 40 MG tablet TAKE 1 TABLET TWICE DAILY  . Glucosamine-Chondroitin 250-200 MG CAPS Take 2 capsules by mouth daily.   Marland Kitchen glucose blood (ACCU-CHEK SMARTVIEW) test strip USE TO CHECK BLOOD SUGAR TWICE A DAY AND PRN  . guaiFENesin-dextromethorphan (ROBITUSSIN DM) 100-10 MG/5ML syrup Take 5 mLs by mouth every 4 (four) hours as needed for cough.  . losartan (COZAAR) 100 MG tablet Take 1 tablet (100 mg total) by mouth daily.  . metFORMIN (GLUCOPHAGE) 1000 MG tablet TAKE 1 TABLET (1,000 MG TOTAL) BY MOUTH 2 (TWO) TIMES DAILY WITH MEALS  . metoprolol succinate (TOPROL-XL) 100 MG 24 hr tablet TAKE 1 TABLET EVERY DAY  . Misc Natural Products (TART CHERRY ADVANCED) CAPS Take 2 capsules by mouth daily.   . Multiple Vitamin (MULTIVITAMIN) tablet Take 1 tablet by mouth daily.    . nitroGLYCERIN (NITROSTAT) 0.4 MG  SL tablet Place 1 tablet (0.4 mg total) under the tongue every 5 (five) minutes as needed. For chest pain  . Omega-3 Fatty Acids (FISH OIL) 1200 MG CAPS Take 1,200 mg by mouth daily.   . potassium chloride (K-DUR) 10 MEQ tablet Take 1 tablet (10 mEq total) by mouth 2 (two) times daily.  . Probiotic Product (PHILLIPS COLON HEALTH PO) Take 1 capsule by mouth daily.  Marland Kitchen rOPINIRole (REQUIP) 1 MG tablet Take 0.5 mg by mouth 3 (three) times daily.  . vitamin B-12 (CYANOCOBALAMIN) 1000 MCG tablet Take 1,000 mcg by mouth daily.  Marland Kitchen warfarin (COUMADIN) 10 MG tablet TAKE 1 TABLET DAILY OR AS DIRECTED BY COUMADIN CLINIC   No facility-administered encounter medications on file as of 10/01/2019.  :  Review of Systems:  Out of a complete 14 point review of systems, all are reviewed and negative with the exception of these symptoms as listed below: Review of Systems  Neurological:       Pt presents today for PD follow up.     Objective:  Neurological Exam  Physical Exam Physical Examination:  Vitals:   10/01/19 1017  BP: (!) 147/85  Pulse: 74  Temp: (!) 97.4 F (36.3 C)    General Examination: The patient is a very pleasant 74 y.o. male in no acute distress. He appears well-developed and well-nourished and well groomed.   HEENT:Normocephalic, atraumatic, pupils are equal, round and reactive to light, he has corrective eyeglasses, extraocular tracking is mildly impaired with saccadic eye movements noted, slight limitations of upgaze noted. No nystagmus. Face is symmetric with with moderate facial masking noted, he has moderate hypophonia. He has Moderate nuchal rigidity. He has mild excess moisture, no obvious sialorrhea. Tongue protrudes centrally in palate elevates symmetrically. There is mild mouth dryness noted. Airway examination reveals moderate airway crowding. Hearing is mildly impaired.No obvious lip, neck or jaw tremor, no voice tremor. No dysarthria. Speech is  hypophonic.  Chest:Clear to auscultation without wheezing, rhonchi or crackles noted.  Heart:S1+S2+0, regular and normal without murmurs, rubs or gallops noted.   Abdomen:Soft, non-tender and non-distended with normal bowel sounds appreciated on auscultation.  Extremities:There is1-2+ edema in the Lower extremities, he is wearing compression socks.  Skin: Warm and dry without trophic changes noted. Healed area of abrasion, L elbow.  Musculoskeletal: exam reveals:Significant swelling in his hands in the knuckles, particularly right middle finger, he has mild ulnar deviation on the right, arthritic changes in both hands and both knees, right knee swollen and tender, L Knee discomfort.  Neurologically:  Mental status: The patient is awake, alert and oriented in all 4 spheres.Hisimmediate and remote memory, attention, language skills and fund of knowledge are appropriate. There is no evidence of aphasia, agnosia, apraxia or anomia. Thought process is linear. Mood is normaland affect is normal.  Cranial nerves II - XII are as described above under HEENT exam. In addition: shoulder shrug is normalBut at rest, right shoulder is higher than left.  (On8/26/2020:On Archimedes spiral drawing, he has insecurity with the left hand which is his nondominant hand, slight insecurity noted with the right hand, handwriting is small, legible but slightly tremulous as well.)   He has a bilateral upper extremity resting tremor, right more consistent than left. He has a mild postural tremor bilaterally, no significant action tremor, no intention tremor.  On fine motor testing, he has moderate difficulty with finger taps, hand movements and rapid alternating padding on the right side, slightly better on the left. Foot taps and foot agility are moderately impaired on the right and slightly better on the left. He has no lower extremity resting tremor. He stands up with difficulty and has to  push himself up, requires 2 attempta and no assistance needed. He stands slightly wide-based, posture slightly stooped for age. He walks with decreased arm swing, decreased stride length and decreased pace, arm swing nearly absent on the right > L side. His balance is impaired, noticeable with difficulty turning. No obvious festination or freezing noted. He did not bring a walking aid. Cerebellar testing: No dysmetria or intention tremor on finger to nose testing. Heel to shinWas not attempted secondary to knee pain bilaterally. Sensory exam reveals no obvious numbness, he has intact sensation to light touch throughout.  Assessmentand Plan:   In summary,Jeremy W Friddleis a very pleasant 73 year oldmalewith an underlying complex medical history of hypertension, hyperlipidemia, gout, diabetes, coronary artery disease, history of mitral valve regurgitation, aortic stenosis, carotid artery disease, spinal stenosis of the lumbar spine, status post lumbar spine surgery in September 2019 under Dr. Melvyn Novas, history of aortic valve replacement, status post cardiac  pacemaker placement, status post CABG, knee arthritis bilaterally, status post lumbar laminectomy, and obesity, whopresentsfor Follow-up consultation of his parkinsonism, likely right-sided predominant Parkinson's diseasey. He could not tolerate Sinemet secondary to nausea and diarrhea. He started taking Requip low-dose in 10/20, but eventually started having swelling and sleepiness with it, now on 0.5 mg tid. When he started it, he felt it helped his tremor and dexterity as well as stiffness. At this juncture, he is advised to switch to Neupro patch, 2 mg/24 hours and monitor his swelling as well as sleepiness during the day. We talked about expectations and potential side effects.  He is advised FU in 3 mo to see the NP, to see if we can increase to the next dose, 4 mg patch at the time. I answered all the questions today and the patient  and his wife were in agreement. We talked about the importance of fall prevention. I spent 30 minutes in total face-to-face time and in reviewing records during pre-charting, more than 50% of which was spent in counseling and coordination of care, reviewing test results, reviewing medications and treatment regimen and/or in discussing or reviewing the diagnosis of PD, the prognosis and treatment options. Pertinent laboratory and imaging test results that were available during this visit with the patient were reviewed by me and considered in my medical decision making (see chart for details).

## 2019-10-01 NOTE — Patient Instructions (Addendum)
As discussed, we will switch you from ropinirole low-dose by mouth to a patch for Parkinson's disease called Neupro: (rotigotine patch) 2 mg/24 hour: Use 1 patch each night daily.  Once you have filled the prescription for the patches, you can stop the ropinirole.  Common side effects reported are: Sedation, sleepiness, nausea, vomiting, and rare side effects are confusion, hallucinations, swelling in legs, and abnormal behaviors, including impulse control problems, which can manifest as excessive eating, obsessions with food or gambling, or hypersexuality.  Fall risk is real! Please remember to stand up slowly and get your bearings first turn slowly, no bending down to pick anything, no heavy lifting, be extra careful at night and first thing in the morning. Also, be careful in the Bathroom and the kitchen.  Please use your walker at night when you have to get up.  Please try to hydrate well with water, change positions slowly. Please follow-up to see the nurse practitioner in about 3 months.  We will consider increasing your patch to 4 mg at the time.

## 2019-10-02 ENCOUNTER — Telehealth: Payer: Self-pay | Admitting: Neurology

## 2019-10-02 MED ORDER — PRAMIPEXOLE DIHYDROCHLORIDE 0.125 MG PO TABS: ORAL_TABLET | ORAL | 3 refills | Status: DC

## 2019-10-02 NOTE — Telephone Encounter (Addendum)
I called pt's wife, per DPR, and discussed this with her. She is agreeable to this plan and verbalized understanding of all the recommendations. She will call us if any side effects are noted. She had no further questions or concerns.

## 2019-10-02 NOTE — Telephone Encounter (Signed)
I called pt to discuss. No answer, left a message asking him to call me back. 

## 2019-10-02 NOTE — Telephone Encounter (Signed)
[name] wife Shukla,Jeanetta(wife on [place]) has called to report that this medication/patch rotigotine (NEUPRO) 2 MG/24HR would cost pt $95.00, pt is unable to afford that.  Wife is asking if there is something else pt can be put on for a lesser amount of money.

## 2019-10-02 NOTE — Addendum Note (Signed)
Addended by: Star Age on: 10/02/2019 01:26 PM   Modules accepted: Orders

## 2019-10-02 NOTE — Telephone Encounter (Signed)
Please call wife back:  Since Neupro is too costly and Requip caused side effects at a higher dose, we can try Mirapex (generic name: pramipexole) 0.125 mg: Take 1 pill tid for 1 week, then 2 pills tid for one week, then 3 pills tid thereafter.   Common side effects reported are: Sedation, sleepiness, nausea, vomiting, and rare side effects are confusion, hallucinations, swelling in legs, and abnormal behaviors, including impulse control problems, which can manifest as excessive eating, obsessions with food or gambling, or hypersexuality.  I have sent a prescription to the pharmacy.  Once they are able to fill the prescription he can stop the ropinirole.  He can start the Mirapex right away after stopping the ropinirole.

## 2019-10-02 NOTE — Telephone Encounter (Signed)
The [facility] price of neupro 2mg  patches is actually over $700 for 30 patches.  Is there another option for this pt?

## 2019-10-11 DIAGNOSIS — Z85828 Personal history of other malignant neoplasm of skin: Secondary | ICD-10-CM | POA: Diagnosis not present

## 2019-10-11 DIAGNOSIS — L821 Other seborrheic keratosis: Secondary | ICD-10-CM | POA: Diagnosis not present

## 2019-10-11 DIAGNOSIS — L814 Other melanin hyperpigmentation: Secondary | ICD-10-CM | POA: Diagnosis not present

## 2019-10-11 DIAGNOSIS — L570 Actinic keratosis: Secondary | ICD-10-CM | POA: Diagnosis not present

## 2019-10-11 DIAGNOSIS — D1801 Hemangioma of skin and subcutaneous tissue: Secondary | ICD-10-CM | POA: Diagnosis not present

## 2019-10-11 DIAGNOSIS — C44319 Basal cell carcinoma of skin of other parts of face: Secondary | ICD-10-CM | POA: Diagnosis not present

## 2019-10-11 HISTORY — PX: SKIN BIOPSY: SHX1

## 2019-10-15 ENCOUNTER — Other Ambulatory Visit: Payer: Self-pay

## 2019-10-15 ENCOUNTER — Ambulatory Visit (INDEPENDENT_AMBULATORY_CARE_PROVIDER_SITE_OTHER): Payer: Medicare HMO | Admitting: Pharmacist

## 2019-10-15 DIAGNOSIS — Z5181 Encounter for therapeutic drug level monitoring: Secondary | ICD-10-CM | POA: Diagnosis not present

## 2019-10-15 DIAGNOSIS — Z952 Presence of prosthetic heart valve: Secondary | ICD-10-CM | POA: Diagnosis not present

## 2019-10-15 DIAGNOSIS — Z09 Encounter for follow-up examination after completed treatment for conditions other than malignant neoplasm: Secondary | ICD-10-CM

## 2019-10-15 LAB — POCT INR: INR: 2.2 (ref 2.0–3.0)

## 2019-10-15 NOTE — Patient Instructions (Signed)
Description   Continue to take 1 tablet every day except 1/2 tablet on Sundays. Recheck in 4 weeks. Call with any changes. # [phone].

## 2019-10-16 ENCOUNTER — Ambulatory Visit (INDEPENDENT_AMBULATORY_CARE_PROVIDER_SITE_OTHER): Payer: Medicare HMO | Admitting: Adult Health

## 2019-10-16 ENCOUNTER — Encounter: Payer: Self-pay | Admitting: Adult Health

## 2019-10-16 VITALS — BP 118/70 | Temp 97.9°F | Wt 208.0 lb

## 2019-10-16 DIAGNOSIS — E119 Type 2 diabetes mellitus without complications: Secondary | ICD-10-CM | POA: Diagnosis not present

## 2019-10-16 LAB — POCT GLYCOSYLATED HEMOGLOBIN (HGB A1C): HbA1c, POC (controlled diabetic range): 5.8 % (ref 0.0–7.0)

## 2019-10-16 NOTE — Patient Instructions (Signed)
It was great seeing you today   Your A1c has improved to 5.8 - I am going to have you decrease the Metformin 500 mg BID   Continue to work on diet and stay active

## 2019-10-16 NOTE — Progress Notes (Signed)
Subjective:    Patient ID: [name], male    DOB: 09/16/1945, 74 y.o.   MRN: [id number]  HPI  74 year old male who  has a past medical history of Acute renal failure ([place]) (02/09/2013), Aortic stenosis (08/25/2010), Arthritis, Asthma, AV BLOCK, COMPLETE, CAD (coronary artery disease), Carotid artery disease ([place]), Diabetes mellitus without complication ([place]), DIVERTICULOSIS, COLON (10/17/2007), Ejection fraction, GERD (gastroesophageal reflux disease), GOUT (01/16/2007), Headache, HEMORRHOIDS, INTERNAL (11/01/2008), adenomatous polyp of colon (02/2019), HYPERLIPIDEMIA (01/16/2007), HYPERTENSION (01/16/2007), Impaired glucose tolerance, LEG EDEMA (11/27/2008), Special screening for malignant neoplasm of prostate, and Warfarin anticoagulation.  He presents to the office today for 6 month follow up regarding DM. He is currently prescribed Metformin 1000 mg BID. He does monitor his BS periodically at home and reports readings in the 120's or below. He denies symptoms of hypoglycemia   Lab Results  Component Value Date   HGBA1C 6.3 04/09/2019   He has been eating healthy and has been walking daily. He has been able to lose weight  Wt Readings from Last 3 Encounters:  10/16/19 208 lb (94.3 kg)  10/01/19 205 lb (93 kg)  06/22/19 218 lb 6.4 oz (99.1 kg)    Review of Systems See HPI   Past Medical History:  Diagnosis Date  . Acute renal failure ([place]) 02/09/2013  . Aortic stenosis 08/25/2010   a. [place] aortic root replacement with a St. Jude mechanical valve and Hemashield conduit 02/2004.  Marland Kitchen Arthritis   . Asthma   . AV BLOCK, COMPLETE    a. s/p St Jude dual chamber pacemaker 02/2004.  Marland Kitchen CAD (coronary artery disease)    a. s/p CABGx2 (LIMA-dLAD, SVG-Cx). b. Low risk nuc 12/2012 without ischemia, EF 46% mild apical hypokinesia (EF 55% inf HK by echo).  . Carotid artery disease ([place])    a. 0-39% bilateral ICA stenosis, stable mild hard plaque in carotid bulbs. F/u 03/2014 recommended.  . Diabetes  mellitus without complication ([place])    type 2   . DIVERTICULOSIS, COLON 10/17/2007  . Ejection fraction    a. EF 55% with inf HK, mild MR by echo 12/2012.  Marland Kitchen GERD (gastroesophageal reflux disease)    hxof   . GOUT 01/16/2007  . Headache    hx of migraines   . HEMORRHOIDS, INTERNAL 11/01/2008  . Hx of adenomatous polyp of colon 02/2019   no recall needed  . HYPERLIPIDEMIA 01/16/2007  . HYPERTENSION 01/16/2007  . Impaired glucose tolerance   . LEG EDEMA 11/27/2008  . Special screening for malignant neoplasm of prostate   . Warfarin anticoagulation    AVR    Social History   Socioeconomic History  . Marital status: Married    Spouse name: Not on file  . Number of children: 3  . Years of education: Not on file  . Highest education level: Not on file  Occupational History  . Occupation: retired  Tobacco Use  . Smoking status: Former Smoker    Quit date: 07/12/1973    Years since quitting: 46.2  . Smokeless tobacco: Never Used  Substance and Sexual Activity  . Alcohol use: No  . Drug use: No  . Sexual activity: Not on file  Other Topics Concern  . Not on file  Social History Narrative   The patient is married and retired   He reports 3 grown children   Former smoker, no alcohol or tobacco or drug use at this time   Social Determinants of Engineer, drilling  Resource Strain:   . Difficulty of Paying Living Expenses:   Food Insecurity:   . Worried About Charity fundraiser in the Last Year:   . Arboriculturist in the Last Year:   Transportation Needs:   . Film/video editor (Medical):   Marland Kitchen Lack of Transportation (Non-Medical):   Physical Activity:   . Days of Exercise per Week:   . Minutes of Exercise per Session:   Stress:   . Feeling of Stress :   Social Connections:   . Frequency of Communication with Friends and Family:   . Frequency of Social Gatherings with Friends and Family:   . Attends Religious Services:   . Active Member of Clubs or Organizations:   .  Attends Archivist Meetings:   Marland Kitchen Marital Status:   Intimate Partner Violence:   . Fear of Current or Ex-Partner:   . Emotionally Abused:   Marland Kitchen Physically Abused:   . Sexually Abused:     Past Surgical History:  Procedure Laterality Date  . AORTIC VALVE REPLACEMENT    . CARDIAC CATHETERIZATION  06/2003  . CIRCUMCISION    . COLONOSCOPY    . CORONARY ARTERY BYPASS GRAFT    . coronary stents      prior to bypass   . EP IMPLANTABLE DEVICE N/A 05/06/2016   Procedure: PPM Generator Changeout;  Surgeon: Evans Lance, MD;  Location: [facility];  Service: Cardiovascular;  Laterality: N/A;  . ESOPHAGOGASTRODUODENOSCOPY    . HERNIA REPAIR    . INGUINAL HERNIA REPAIR Bilateral 02/07/2013   Procedure: LAPAROSCOPIC BILATERAL INGUINAL HERNIA REPAIR;  Surgeon: Imogene Burn. Georgette Dover, MD;  Location: [facility];  Service: General;  Laterality: Bilateral;  . INSERTION OF MESH Bilateral 02/07/2013   Procedure: INSERTION OF MESH;  Surgeon: Imogene Burn. Georgette Dover, MD;  Location: [facility];  Service: General;  Laterality: Bilateral;  . LUMBAR LAMINECTOMY/DECOMPRESSION MICRODISCECTOMY N/A 03/15/2018   Procedure: Complete decompressive lumbar laminectomy for spinal stenosis of L4-L5 and foraminotomy for L4-L5 root on right;  Surgeon: [name], MD;  Location: [facility];  Service: Orthopedics;  Laterality: N/A;  . PACEMAKER INSERTION    . SHOULDER SURGERY Bilateral   . SKIN BIOPSY  05/04/2019   BASAL CELL CARCINOMA//MID CENTRAL NASAL    Family History  Problem Relation Age of Onset  . Heart attack Father   . Kidney cancer Father   . Hypertension Mother   . Diabetes Mother   . Diabetes Brother   . Epilepsy Sister   . Diabetes Sister   . Diabetes Brother   . Colon cancer Neg Hx   . Rectal cancer Neg Hx   . Stomach cancer Neg Hx   . Esophageal cancer Neg Hx     Allergies  Allergen Reactions  . Bee Venom Shortness Of Breath and Swelling  . Codeine Phosphate Hives, Shortness Of Breath, Itching,  Rash and Other (See Comments)    Tolerated multiple doses of IV morphine  . Metoprolol Tartrate Other (See Comments)    loss of vision. Can take the XL tablet not regular   . Sinemet [Carbidopa W-Levodopa]   . Tramadol     Pt has hallucinations   . Ramipril Rash and Cough    Current Outpatient Medications on File Prior to Visit  Medication Sig Dispense Refill  . Accu-Chek FastClix Lancets MISC USE TO CHECK BLOOD SUGAR TWICE A DAY AND AS NEEDED 306 each 0  . acetaminophen (TYLENOL ARTHRITIS PAIN) 650 MG  CR tablet Take 1,300 mg by mouth 2 (two) times daily.    Marland Kitchen albuterol (VENTOLIN HFA) 108 (90 Base) MCG/ACT inhaler Inhale 2 puffs into the lungs every 6 (six) hours as needed for wheezing or shortness of breath. 54 g 0  . Ascorbic Acid (VITAMIN C) 500 MG tablet Take 1,000 mg by mouth daily.     Marland Kitchen aspirin 81 MG tablet Take 81 mg by mouth every evening.     Marland Kitchen atorvastatin (LIPITOR) 40 MG tablet Take 1 tablet (40 mg total) by mouth daily at 6 PM. 90 tablet 3  . calcium carbonate (OS-CAL) 600 MG TABS Take 600 mg by mouth 2 (two) times daily with a meal.    . cetirizine (ZYRTEC) 10 MG tablet Take 10 mg by mouth daily.     . fluticasone (FLONASE) 50 MCG/ACT nasal spray Place 2 sprays into both nostrils daily. 16 g 6  . furosemide (LASIX) 40 MG tablet TAKE 1 TABLET TWICE DAILY 180 tablet 3  . glucose blood (ACCU-CHEK SMARTVIEW) test strip USE TO CHECK BLOOD SUGAR TWICE A DAY AND PRN 300 each 0  . guaiFENesin-dextromethorphan (ROBITUSSIN DM) 100-10 MG/5ML syrup Take 5 mLs by mouth every 4 (four) hours as needed for cough.    . losartan (COZAAR) 100 MG tablet Take 1 tablet (100 mg total) by mouth daily. 90 tablet 3  . metFORMIN (GLUCOPHAGE) 1000 MG tablet TAKE 1 TABLET TWICE DAILY WITH MEALS (Patient taking differently: Take 500 mg by mouth 2 (two) times daily with a meal. ) 180 tablet 0  . metoprolol succinate (TOPROL-XL) 100 MG 24 hr tablet TAKE 1 TABLET EVERY DAY 90 tablet 3  . Misc Natural  Products (TART CHERRY ADVANCED) CAPS Take 2 capsules by mouth daily.     . Multiple Vitamin (MULTIVITAMIN) tablet Take 1 tablet by mouth daily.      . nitroGLYCERIN (NITROSTAT) 0.4 MG SL tablet Place 1 tablet (0.4 mg total) under the tongue every 5 (five) minutes as needed. For chest pain 25 tablet 3  . Omega-3 Fatty Acids (FISH OIL) 1200 MG CAPS Take 1,200 mg by mouth daily.     . potassium chloride (K-DUR) 10 MEQ tablet Take 1 tablet (10 mEq total) by mouth 2 (two) times daily. 180 tablet 3  . pramipexole (MIRAPEX) 0.125 MG tablet Take 1 pill 3 times a day for 1 week, then 2 pills 3 times a day for 1 week, then 3 pills 3 times a day thereafter. 270 tablet 3  . Probiotic Product (PHILLIPS COLON HEALTH PO) Take 1 capsule by mouth daily.    . vitamin B-12 (CYANOCOBALAMIN) 1000 MCG tablet Take 1,000 mcg by mouth daily.    Marland Kitchen warfarin (COUMADIN) 10 MG tablet TAKE 1 TABLET DAILY OR AS DIRECTED BY COUMADIN CLINIC 90 tablet 1  . rOPINIRole (REQUIP) 1 MG tablet Take 0.5 mg by mouth 3 (three) times daily.     No current facility-administered medications on file prior to visit.    BP 118/70   Temp 97.9 F (36.6 C) (Temporal)   Wt 208 lb (94.3 kg)   BMI 32.58 kg/m       Objective:   Physical Exam Vitals and nursing note reviewed.  Constitutional:      Appearance: Normal appearance.  Cardiovascular:     Rate and Rhythm: Normal rate and regular rhythm.     Pulses: Normal pulses.     Heart sounds: Normal heart sounds.  Pulmonary:     Effort: Pulmonary effort  is normal.     Breath sounds: Normal breath sounds.  Skin:    Capillary Refill: Capillary refill takes less than 2 seconds.  Neurological:     General: No focal deficit present.     Mental Status: He is alert and oriented to person, place, and time.  Psychiatric:        Mood and Affect: Mood normal.        Behavior: Behavior normal.        Thought Content: Thought content normal.        Judgment: Judgment normal.         Assessment & Plan:  1. Diabetes mellitus without complication ([place]) - POCT A1C- 5. 8 - Has improved  - Congratulated on weight loss  - Will have him decrease metformin from 1000 mg to 500 mg BID - Follow up in June for CPE   [name], NP

## 2019-11-01 DIAGNOSIS — M13861 Other specified arthritis, right knee: Secondary | ICD-10-CM | POA: Diagnosis not present

## 2019-11-02 ENCOUNTER — Telehealth: Payer: Self-pay | Admitting: Neurology

## 2019-11-02 ENCOUNTER — Encounter: Payer: Self-pay | Admitting: Neurology

## 2019-11-02 NOTE — Telephone Encounter (Signed)
[name] wife [name] on [place] called stating that the pt's pramipexole (MIRAPEX) 0.125 MG tablet is swelling his feet and ankles and giving him hallucinations. She would like to discuss with provider or RN.

## 2019-11-05 ENCOUNTER — Telehealth: Payer: Self-pay | Admitting: Neurology

## 2019-11-05 NOTE — Telephone Encounter (Signed)
Please call patient or his wife, since he had side effects on Sinemet, ropinirole, and Mirapex, we can try the Neupro patch, I remember that it was costly.  If they would like to try it at this point, I can redo the prescription for the Neupro patch or they can call the pharmacy to pick up that prescription if it still available at the pharmacy.  I would be happy to rewrite it if he would like to consider it.

## 2019-11-05 NOTE — Telephone Encounter (Signed)
My chart message sent to the pt in regards to this message.

## 2019-11-05 NOTE — Telephone Encounter (Signed)
Patient sent my chart message on 11/02/2019 and 11/05/2019 in regards to his Pramipexole. Pt reports since starting the medication he has noticed swelling of his ankles/feet along with hallucinations.  Pt wanted MD to be notified he has d/c this med as of 4/22 and is feeling better. He reports swelling has decreased, sleeping has improved and hallucinations are in frequent.  He wanted to know if Dr. Rexene Alberts had any further recommendations?

## 2019-11-05 NOTE — Telephone Encounter (Signed)
We could try Sinemet CR, which is a long acting formulation of levodopa.  Some patients have less side effects from the long-acting version of carbidopa-levodopa.  If he is agreeable, please put in prescription for Sinemet CR, 25-100 mg strength 1 pill twice daily for 1 week, then 1 pill 3 times daily thereafter, 90 pills with 3 refills.

## 2019-11-05 NOTE — Telephone Encounter (Signed)
I have sent Dr. Guadelupe Sabin recommendation to the pt via my chart.

## 2019-11-05 NOTE — Telephone Encounter (Signed)
I have sent the pt a my chart message in regards to this message.

## 2019-11-06 DIAGNOSIS — Z85828 Personal history of other malignant neoplasm of skin: Secondary | ICD-10-CM | POA: Diagnosis not present

## 2019-11-06 DIAGNOSIS — C44319 Basal cell carcinoma of skin of other parts of face: Secondary | ICD-10-CM | POA: Diagnosis not present

## 2019-11-07 ENCOUNTER — Telehealth: Payer: Self-pay | Admitting: Internal Medicine

## 2019-11-07 NOTE — Telephone Encounter (Signed)
Returned a call to the wife and she was able to explain that we are still limiting visitors in the building. She stated that he has been doing fine but has been unsteady on his feet and she would like to be with him for the visit. Advised she may come to the appt on Monday since he is unsteady. She is aware that if anything changes she should update [place].

## 2019-11-07 NOTE — Telephone Encounter (Signed)
New Message    Pts wife is calling and is wondering if she can come with the pt to his coumadin appt. She says he has parkinson's     Please advise

## 2019-11-08 ENCOUNTER — Ambulatory Visit (INDEPENDENT_AMBULATORY_CARE_PROVIDER_SITE_OTHER): Payer: Medicare HMO | Admitting: *Deleted

## 2019-11-08 DIAGNOSIS — I442 Atrioventricular block, complete: Secondary | ICD-10-CM | POA: Diagnosis not present

## 2019-11-08 LAB — CUP PACEART REMOTE DEVICE CHECK
Battery Remaining Longevity: 128 mo
Battery Remaining Percentage: 95.5 %
Battery Voltage: 2.99 V
Brady Statistic AP VP Percent: 26 %
Brady Statistic AP VS Percent: 1 %
Brady Statistic AS VP Percent: 74 %
Brady Statistic AS VS Percent: 1 %
Brady Statistic RA Percent Paced: 25 %
Brady Statistic RV Percent Paced: 99 %
Date Time Interrogation Session: 20210429092958
Implantable Lead Implant Date: 20050816
Implantable Lead Implant Date: 20050816
Implantable Lead Location: 753859
Implantable Lead Location: 753860
Implantable Pulse Generator Implant Date: 20171026
Lead Channel Impedance Value: 490 Ohm
Lead Channel Impedance Value: 690 Ohm
Lead Channel Pacing Threshold Amplitude: 0.5 V
Lead Channel Pacing Threshold Amplitude: 0.875 V
Lead Channel Pacing Threshold Pulse Width: 0.5 ms
Lead Channel Pacing Threshold Pulse Width: 0.5 ms
Lead Channel Sensing Intrinsic Amplitude: 12 mV
Lead Channel Sensing Intrinsic Amplitude: 3.3 mV
Lead Channel Setting Pacing Amplitude: 0.75 V
Lead Channel Setting Pacing Amplitude: 1.875
Lead Channel Setting Pacing Pulse Width: 0.5 ms
Lead Channel Setting Sensing Sensitivity: 4 mV
Pulse Gen Model: 2272
Pulse Gen Serial Number: 3182792

## 2019-11-09 ENCOUNTER — Encounter: Payer: Self-pay | Admitting: Family Medicine

## 2019-11-09 NOTE — Progress Notes (Signed)
PPM Remote  

## 2019-11-12 ENCOUNTER — Other Ambulatory Visit: Payer: Self-pay

## 2019-11-12 ENCOUNTER — Ambulatory Visit (INDEPENDENT_AMBULATORY_CARE_PROVIDER_SITE_OTHER): Payer: Medicare HMO | Admitting: *Deleted

## 2019-11-12 DIAGNOSIS — Z952 Presence of prosthetic heart valve: Secondary | ICD-10-CM

## 2019-11-12 DIAGNOSIS — Z5181 Encounter for therapeutic drug level monitoring: Secondary | ICD-10-CM | POA: Diagnosis not present

## 2019-11-12 DIAGNOSIS — Z09 Encounter for follow-up examination after completed treatment for conditions other than malignant neoplasm: Secondary | ICD-10-CM | POA: Diagnosis not present

## 2019-11-12 LAB — POCT INR: INR: 1.9 — AB (ref 2.0–3.0)

## 2019-11-12 NOTE — Patient Instructions (Signed)
Description   Today take 1.5 tablets then continue to take 1 tablet every day except 1/2 tablet on Sundays. Recheck in 4 weeks. Call with any changes. # [phone].

## 2019-11-20 DIAGNOSIS — G629 Polyneuropathy, unspecified: Secondary | ICD-10-CM | POA: Diagnosis not present

## 2019-11-20 DIAGNOSIS — G20 Parkinson's disease: Secondary | ICD-10-CM | POA: Diagnosis not present

## 2019-11-28 ENCOUNTER — Telehealth: Payer: Self-pay | Admitting: Adult Health

## 2019-11-28 DIAGNOSIS — G20 Parkinson's disease: Secondary | ICD-10-CM | POA: Diagnosis not present

## 2019-11-28 NOTE — Chronic Care Management (AMB) (Signed)
  Chronic Care Management   Note  11/28/2019 Name: [name] MRN: [id number] DOB: 1946-05-25  [name] is a 74 y.o. year old male who is a primary care patient of [name], NP. I reached out to [name] by phone today in response to a referral sent by [name] PCP, [name], NP.   [name] was given information about Chronic Care Management services today including:  1. CCM service includes personalized support from designated clinical staff supervised by his physician, including individualized plan of care and coordination with other care providers 2. 24/7 contact phone numbers for assistance for urgent and routine care needs. 3. Service will only be billed when office clinical staff spend 20 minutes or more in a month to coordinate care. 4. Only one practitioner may furnish and bill the service in a calendar month. 5. The patient may stop CCM services at any time (effective at the end of the month) by phone call to the office staff.   Patient agreed to services and verbal consent obtained.   Follow up plan:   [name]

## 2019-12-02 ENCOUNTER — Other Ambulatory Visit: Payer: Self-pay | Admitting: Adult Health

## 2019-12-07 ENCOUNTER — Other Ambulatory Visit: Payer: Self-pay | Admitting: Adult Health

## 2019-12-12 ENCOUNTER — Other Ambulatory Visit: Payer: Self-pay

## 2019-12-12 ENCOUNTER — Ambulatory Visit (INDEPENDENT_AMBULATORY_CARE_PROVIDER_SITE_OTHER): Payer: Medicare HMO | Admitting: *Deleted

## 2019-12-12 DIAGNOSIS — Z09 Encounter for follow-up examination after completed treatment for conditions other than malignant neoplasm: Secondary | ICD-10-CM | POA: Diagnosis not present

## 2019-12-12 DIAGNOSIS — Z952 Presence of prosthetic heart valve: Secondary | ICD-10-CM | POA: Diagnosis not present

## 2019-12-12 DIAGNOSIS — Z5181 Encounter for therapeutic drug level monitoring: Secondary | ICD-10-CM | POA: Diagnosis not present

## 2019-12-12 LAB — POCT INR: INR: 2.6 (ref 2.0–3.0)

## 2019-12-12 NOTE — Patient Instructions (Signed)
Description   Continue taking 1 tablet every day except 1/2 tablet on Sundays. Recheck in 5 weeks. Call with any changes. # [phone].

## 2020-01-02 ENCOUNTER — Ambulatory Visit: Payer: Medicare HMO | Admitting: Adult Health

## 2020-01-08 ENCOUNTER — Telehealth: Payer: Medicare HMO

## 2020-01-09 ENCOUNTER — Telehealth: Payer: Self-pay | Admitting: Adult Health

## 2020-01-09 ENCOUNTER — Encounter: Payer: Self-pay | Admitting: Adult Health

## 2020-01-09 ENCOUNTER — Telehealth: Payer: Self-pay | Admitting: *Deleted

## 2020-01-09 ENCOUNTER — Ambulatory Visit (INDEPENDENT_AMBULATORY_CARE_PROVIDER_SITE_OTHER): Payer: Medicare HMO | Admitting: Adult Health

## 2020-01-09 ENCOUNTER — Other Ambulatory Visit: Payer: Self-pay

## 2020-01-09 VITALS — BP 120/84 | Temp 97.9°F | Ht 66.0 in | Wt 204.0 lb

## 2020-01-09 DIAGNOSIS — Z125 Encounter for screening for malignant neoplasm of prostate: Secondary | ICD-10-CM

## 2020-01-09 DIAGNOSIS — I10 Essential (primary) hypertension: Secondary | ICD-10-CM | POA: Diagnosis not present

## 2020-01-09 DIAGNOSIS — M170 Bilateral primary osteoarthritis of knee: Secondary | ICD-10-CM | POA: Diagnosis not present

## 2020-01-09 DIAGNOSIS — I442 Atrioventricular block, complete: Secondary | ICD-10-CM

## 2020-01-09 DIAGNOSIS — E785 Hyperlipidemia, unspecified: Secondary | ICD-10-CM

## 2020-01-09 DIAGNOSIS — Z0000 Encounter for general adult medical examination without abnormal findings: Secondary | ICD-10-CM | POA: Diagnosis not present

## 2020-01-09 DIAGNOSIS — Z7901 Long term (current) use of anticoagulants: Secondary | ICD-10-CM

## 2020-01-09 DIAGNOSIS — E119 Type 2 diabetes mellitus without complications: Secondary | ICD-10-CM

## 2020-01-09 DIAGNOSIS — Z950 Presence of cardiac pacemaker: Secondary | ICD-10-CM

## 2020-01-09 DIAGNOSIS — G20 Parkinson's disease: Secondary | ICD-10-CM

## 2020-01-09 DIAGNOSIS — Z952 Presence of prosthetic heart valve: Secondary | ICD-10-CM

## 2020-01-09 DIAGNOSIS — I2510 Atherosclerotic heart disease of native coronary artery without angina pectoris: Secondary | ICD-10-CM

## 2020-01-09 LAB — COMPREHENSIVE METABOLIC PANEL
ALT: 20 U/L (ref 0–53)
AST: 20 U/L (ref 0–37)
Albumin: 4.4 g/dL (ref 3.5–5.2)
Alkaline Phosphatase: 49 U/L (ref 39–117)
BUN: 17 mg/dL (ref 6–23)
CO2: 30 mEq/L (ref 19–32)
Calcium: 9.5 mg/dL (ref 8.4–10.5)
Chloride: 104 mEq/L (ref 96–112)
Creatinine, Ser: 0.83 mg/dL (ref 0.40–1.50)
GFR: 90.19 mL/min (ref >60.00)
Glucose, Bld: 99 mg/dL (ref 70–99)
Potassium: 4.1 mEq/L (ref 3.5–5.1)
Sodium: 140 mEq/L (ref 135–145)
Total Bilirubin: 0.7 mg/dL (ref 0.2–1.2)
Total Protein: 6.2 g/dL (ref 6.0–8.3)

## 2020-01-09 LAB — LIPID PANEL
Cholesterol: 130 mg/dL (ref 0–200)
HDL: 51.1 mg/dL (ref >39.00)
LDL Cholesterol: 60 mg/dL (ref 0–99)
NonHDL: 78.9
Total CHOL/HDL Ratio: 3
Triglycerides: 97 mg/dL (ref 0.0–149.0)
VLDL: 19.4 mg/dL (ref 0.0–40.0)

## 2020-01-09 LAB — CBC WITH DIFFERENTIAL/PLATELET
Basophils Absolute: 0 10*3/uL (ref 0.0–0.1)
Basophils Relative: 0.6 % (ref 0.0–3.0)
Eosinophils Absolute: 0.1 10*3/uL (ref 0.0–0.7)
Eosinophils Relative: 0.9 % (ref 0.0–5.0)
HCT: 41.9 % (ref 39.0–52.0)
Hemoglobin: 13.9 g/dL (ref 13.0–17.0)
Lymphocytes Relative: 25.4 % (ref 12.0–46.0)
Lymphs Abs: 1.9 10*3/uL (ref 0.7–4.0)
MCHC: 33.2 g/dL (ref 30.0–36.0)
MCV: 89 fl (ref 78.0–100.0)
Monocytes Absolute: 0.5 10*3/uL (ref 0.1–1.0)
Monocytes Relative: 7 % (ref 3.0–12.0)
Neutro Abs: 5 10*3/uL (ref 1.4–7.7)
Neutrophils Relative %: 66.1 % (ref 43.0–77.0)
Platelets: 197 10*3/uL (ref 150.0–400.0)
RBC: 4.71 Mil/uL (ref 4.22–5.81)
RDW: 14.7 % (ref 11.5–15.5)
WBC: 7.5 10*3/uL (ref 4.0–10.5)

## 2020-01-09 LAB — PSA: PSA: 1.06 ng/mL (ref 0.10–4.00)

## 2020-01-09 LAB — TSH: TSH: 1.72 u[IU]/mL (ref 0.35–4.50)

## 2020-01-09 LAB — HEMOGLOBIN A1C: Hgb A1c MFr Bld: 6.2 % (ref 4.6–6.5)

## 2020-01-09 NOTE — Telephone Encounter (Signed)
Pt was seen a few minutes ago and wanted me to let you know that he is taking 2 tablets by mouth 3 times daily of Carbidopa-levodopa.

## 2020-01-09 NOTE — Telephone Encounter (Signed)
Noted in chart.  Nothing further needed.

## 2020-01-09 NOTE — Patient Instructions (Signed)
It was great seeing you today  I am glad you are doing better.   We will follow up with you regarding your labs

## 2020-01-09 NOTE — Telephone Encounter (Signed)
Wife called and stated that the she needed to updated the pt's medlist. She states the pt is not taking Mirapex and Requip anymore per his provider and he started taking carbidopa-levodopa 25-100mg  2 pills by mouth three times a day. Updated pt's medlist per request.

## 2020-01-09 NOTE — Progress Notes (Signed)
Subjective:    Patient ID: [name], male    DOB: 1946/05/22, 74 y.o.   MRN: [id number]  HPI Patient presents for yearly preventative medicine examination. He is a pleasant 74 year old male who  has a past medical history of Acute renal failure ([place]) (02/09/2013), Aortic stenosis (08/25/2010), Arthritis, Asthma, AV BLOCK, COMPLETE, CAD (coronary artery disease), Carotid artery disease ([place]), Diabetes mellitus without complication ([place]), DIVERTICULOSIS, COLON (10/17/2007), Ejection fraction, GERD (gastroesophageal reflux disease), GOUT (01/16/2007), Headache, HEMORRHOIDS, INTERNAL (11/01/2008), adenomatous polyp of colon (02/2019), HYPERLIPIDEMIA (01/16/2007), HYPERTENSION (01/16/2007), Impaired glucose tolerance, LEG EDEMA (11/27/2008), Special screening for malignant neoplasm of prostate, and Warfarin anticoagulation.  DM II -controlled with Metformin 500 mg twice daily.  He does check his blood sugars at home and reports readings of 120 and below.  He denies any symptoms of hypoglycemia.  Lab Results  Component Value Date   HGBA1C 5.8 10/16/2019   Hyperlipidemia/CAD-prescribed Lipitor 40 mg daily and is on chronic anticoagulation with Coumadin.  Denies myalgia or fatigue  Lab Results  Component Value Date   CHOL 116 12/28/2018   HDL 47.10 12/28/2018   LDLCALC 52 12/28/2018   TRIG 86.0 12/28/2018   CHOLHDL 2 12/28/2018   HTN -currently controlled with losartan 100 mg daily, Lasix 40 mg, and Toprol 100 mg daily.  He denies episodes of dizziness, lightheadedness, chest pain, or shortness of breath  BP Readings from Last 3 Encounters:  01/09/20 120/84  10/16/19 118/70  10/01/19 (!) 147/85   Aortic Stenosis with mechanical valve replacement -mechanical valve replacement in 2005.  He is followed by cardiology in the Coumadin clinic.  Last echo was in 2017 and showed normal function and no concerning issues.  Complete AV block - s/p pacemaker.   Osteoarthritis -is followed by orthopedics  for bilateral knee pain.  He received steroid injections every 3 to 4 months.  Parkinson's Disease and essential Tremor. He is currently being seen by [facility]rology outside [facility] and was started on Sinemet IR 25-100 mg and Requip and Mirapex was stopped. Since starting Levodopa he has improved coordination and has not had any falls. He and his wife has discussed starting PT with the neurologist.   All immunizations and health maintenance protocols were reviewed with the patient and needed orders were placed.  Appropriate screening laboratory values were ordered for the patient including screening of hyperlipidemia, renal function and hepatic function. If indicated by BPH, a PSA was ordered.  Medication reconciliation,  past medical history, social history, problem list and allergies were reviewed in detail with the patient  Goals were established with regard to weight loss, exercise, and  diet in compliance with medications. He is walking more often and staying active as well as eating a healthier diet.  Wt Readings from Last 3 Encounters:  01/09/20 204 lb (92.5 kg)  10/16/19 208 lb (94.3 kg)  10/01/19 205 lb (93 kg)   He is up-to-date on routine colon cancer screening, as well as dental and vision screens.  Review of Systems  Constitutional: Negative.   HENT: Negative.   Eyes: Negative.   Respiratory: Negative.   Cardiovascular: Negative.   Gastrointestinal: Negative.   Endocrine: Negative.   Genitourinary: Negative.   Musculoskeletal: Positive for arthralgias.  Skin: Negative.   Allergic/Immunologic: Negative.   Neurological: Positive for tremors.  Hematological: Negative.   Psychiatric/Behavioral: Negative.   All other systems reviewed and are negative.  Past Medical History:  Diagnosis Date  . Acute renal failure ([place])  02/09/2013  . Aortic stenosis 08/25/2010   a. [place] aortic root replacement with a St. Jude mechanical valve and Hemashield conduit 02/2004.  Marland Kitchen  Arthritis   . Asthma   . AV BLOCK, COMPLETE    a. s/p St Jude dual chamber pacemaker 02/2004.  Marland Kitchen CAD (coronary artery disease)    a. s/p CABGx2 (LIMA-dLAD, SVG-Cx). b. Low risk nuc 12/2012 without ischemia, EF 46% mild apical hypokinesia (EF 55% inf HK by echo).  . Carotid artery disease ([place])    a. 0-39% bilateral ICA stenosis, stable mild hard plaque in carotid bulbs. F/u 03/2014 recommended.  . Diabetes mellitus without complication ([place])    type 2   . DIVERTICULOSIS, COLON 10/17/2007  . Ejection fraction    a. EF 55% with inf HK, mild MR by echo 12/2012.  Marland Kitchen GERD (gastroesophageal reflux disease)    hxof   . GOUT 01/16/2007  . Headache    hx of migraines   . HEMORRHOIDS, INTERNAL 11/01/2008  . Hx of adenomatous polyp of colon 02/2019   no recall needed  . HYPERLIPIDEMIA 01/16/2007  . HYPERTENSION 01/16/2007  . Impaired glucose tolerance   . LEG EDEMA 11/27/2008  . Special screening for malignant neoplasm of prostate   . Warfarin anticoagulation    AVR    Social History   Socioeconomic History  . Marital status: Married    Spouse name: Not on file  . Number of children: 3  . Years of education: Not on file  . Highest education level: Not on file  Occupational History  . Occupation: retired  Tobacco Use  . Smoking status: Former Smoker    Quit date: 07/12/1973    Years since quitting: 46.5  . Smokeless tobacco: Never Used  Vaping Use  . Vaping Use: Never used  Substance and Sexual Activity  . Alcohol use: No  . Drug use: No  . Sexual activity: Not on file  Other Topics Concern  . Not on file  Social History Narrative   The patient is married and retired   He reports 3 grown children   Former smoker, no alcohol or tobacco or drug use at this time   Social Determinants of Radio broadcast assistant Strain:   . Difficulty of Paying Living Expenses:   Food Insecurity:   . Worried About Charity fundraiser in the Last Year:   . Arboriculturist in the Last Year:     Transportation Needs:   . Film/video editor (Medical):   Marland Kitchen Lack of Transportation (Non-Medical):   Physical Activity:   . Days of Exercise per Week:   . Minutes of Exercise per Session:   Stress:   . Feeling of Stress :   Social Connections:   . Frequency of Communication with Friends and Family:   . Frequency of Social Gatherings with Friends and Family:   . Attends Religious Services:   . Active Member of Clubs or Organizations:   . Attends Archivist Meetings:   Marland Kitchen Marital Status:   Intimate Partner Violence:   . Fear of Current or Ex-Partner:   . Emotionally Abused:   Marland Kitchen Physically Abused:   . Sexually Abused:     Past Surgical History:  Procedure Laterality Date  . AORTIC VALVE REPLACEMENT    . CARDIAC CATHETERIZATION  06/2003  . CIRCUMCISION    . COLONOSCOPY    . CORONARY ARTERY BYPASS GRAFT    . coronary stents  prior to bypass   . EP IMPLANTABLE DEVICE N/A 05/06/2016   Procedure: PPM Generator Changeout;  Surgeon: Evans Lance, MD;  Location: [facility];  Service: Cardiovascular;  Laterality: N/A;  . ESOPHAGOGASTRODUODENOSCOPY    . HERNIA REPAIR    . INGUINAL HERNIA REPAIR Bilateral 02/07/2013   Procedure: LAPAROSCOPIC BILATERAL INGUINAL HERNIA REPAIR;  Surgeon: Imogene Burn. Georgette Dover, MD;  Location: [facility];  Service: General;  Laterality: Bilateral;  . INSERTION OF MESH Bilateral 02/07/2013   Procedure: INSERTION OF MESH;  Surgeon: Imogene Burn. Georgette Dover, MD;  Location: [facility];  Service: General;  Laterality: Bilateral;  . LUMBAR LAMINECTOMY/DECOMPRESSION MICRODISCECTOMY N/A 03/15/2018   Procedure: Complete decompressive lumbar laminectomy for spinal stenosis of L4-L5 and foraminotomy for L4-L5 root on right;  Surgeon: [name], MD;  Location: [facility];  Service: Orthopedics;  Laterality: N/A;  . PACEMAKER INSERTION    . SHOULDER SURGERY Bilateral   . SKIN BIOPSY  05/04/2019   BASAL CELL CARCINOMA//MID CENTRAL NASAL  . SKIN BIOPSY  10/11/2019    Superficial basal cell carcinoma - left superior central forehead shave    Family History  Problem Relation Age of Onset  . Heart attack Father   . Kidney cancer Father   . Hypertension Mother   . Diabetes Mother   . Diabetes Brother   . Epilepsy Sister   . Diabetes Sister   . Diabetes Brother   . Colon cancer Neg Hx   . Rectal cancer Neg Hx   . Stomach cancer Neg Hx   . Esophageal cancer Neg Hx     Allergies  Allergen Reactions  . Bee Venom Shortness Of Breath and Swelling  . Codeine Phosphate Hives, Shortness Of Breath, Itching, Rash and Other (See Comments)    Tolerated multiple doses of IV morphine  . Metoprolol Tartrate Other (See Comments)    loss of vision. Can take the XL tablet not regular   . Sinemet [Carbidopa W-Levodopa]   . Tramadol     Pt has hallucinations   . Ramipril Rash and Cough    Current Outpatient Medications on File Prior to Visit  Medication Sig Dispense Refill  . Accu-Chek FastClix Lancets MISC USE TO CHECK BLOOD SUGAR TWICE A DAY AND AS NEEDED 306 each 0  . acetaminophen (TYLENOL ARTHRITIS PAIN) 650 MG CR tablet Take 1,300 mg by mouth 2 (two) times daily.    Marland Kitchen albuterol (VENTOLIN HFA) 108 (90 Base) MCG/ACT inhaler Inhale 2 puffs into the lungs every 6 (six) hours as needed for wheezing or shortness of breath. 54 g 0  . Ascorbic Acid (VITAMIN C) 500 MG tablet Take 1,000 mg by mouth daily.     Marland Kitchen aspirin 81 MG tablet Take 81 mg by mouth every evening.     Marland Kitchen atorvastatin (LIPITOR) 40 MG tablet Take 1 tablet (40 mg total) by mouth daily at 6 PM. 90 tablet 3  . calcium carbonate (OS-CAL) 600 MG TABS Take 600 mg by mouth 2 (two) times daily with a meal.    . cetirizine (ZYRTEC) 10 MG tablet Take 10 mg by mouth daily.     . fluticasone (FLONASE) 50 MCG/ACT nasal spray Place 2 sprays into both nostrils daily. 16 g 6  . furosemide (LASIX) 40 MG tablet TAKE 1 TABLET TWICE DAILY 180 tablet 3  . glucose blood (ACCU-CHEK SMARTVIEW) test strip USE TO CHECK  BLOOD SUGAR TWICE A DAY AND PRN 300 each 0  . guaiFENesin-dextromethorphan (ROBITUSSIN DM) 100-10 MG/5ML syrup  Take 5 mLs by mouth every 4 (four) hours as needed for cough.    . losartan (COZAAR) 100 MG tablet Take 1 tablet (100 mg total) by mouth daily. 90 tablet 3  . metFORMIN (GLUCOPHAGE) 1000 MG tablet TAKE 1 TABLET TWICE DAILY WITH MEALS 180 tablet 0  . metoprolol succinate (TOPROL-XL) 100 MG 24 hr tablet TAKE 1 TABLET EVERY DAY 90 tablet 3  . Misc Natural Products (TART CHERRY ADVANCED) CAPS Take 2 capsules by mouth daily.     . Multiple Vitamin (MULTIVITAMIN) tablet Take 1 tablet by mouth daily.      . nitroGLYCERIN (NITROSTAT) 0.4 MG SL tablet Place 1 tablet (0.4 mg total) under the tongue every 5 (five) minutes as needed. For chest pain 25 tablet 3  . Omega-3 Fatty Acids (FISH OIL) 1200 MG CAPS Take 1,200 mg by mouth daily.     . potassium chloride (KLOR-CON) 10 MEQ tablet TAKE 1 TABLET TWICE DAILY 180 tablet 3  . Probiotic Product (PHILLIPS COLON HEALTH PO) Take 1 capsule by mouth daily.    . vitamin B-12 (CYANOCOBALAMIN) 1000 MCG tablet Take 1,000 mcg by mouth daily.    Marland Kitchen warfarin (COUMADIN) 10 MG tablet TAKE 1 TABLET DAILY OR AS DIRECTED BY COUMADIN CLINIC 90 tablet 1   No current facility-administered medications on file prior to visit.    BP 120/84   Temp 97.9 F (36.6 C)   Ht 5\' 6"  (1.676 m)   Wt 204 lb (92.5 kg)   BMI 32.93 kg/m       Objective:   Physical Exam Vitals and nursing note reviewed.  Constitutional:      General: He is not in acute distress.    Appearance: Normal appearance. He is well-developed and normal weight.  HENT:     Head: Normocephalic and atraumatic.     Right Ear: Tympanic membrane, ear canal and external ear normal. There is no impacted cerumen.     Left Ear: Tympanic membrane, ear canal and external ear normal. There is no impacted cerumen.     Nose: Nose normal. No congestion or rhinorrhea.     Mouth/Throat:     Mouth: Mucous membranes  are moist.     Pharynx: Oropharynx is clear. No oropharyngeal exudate or posterior oropharyngeal erythema.  Eyes:     General:        Right eye: No discharge.        Left eye: No discharge.     Extraocular Movements: Extraocular movements intact.     Conjunctiva/sclera: Conjunctivae normal.     Pupils: Pupils are equal, round, and reactive to light.  Neck:     Vascular: No carotid bruit.     Trachea: No tracheal deviation.  Cardiovascular:     Rate and Rhythm: Normal rate and regular rhythm.     Pulses: Normal pulses.     Heart sounds: Normal heart sounds. No murmur heard.  No friction rub. No gallop.   Pulmonary:     Effort: Pulmonary effort is normal. No respiratory distress.     Breath sounds: Normal breath sounds. No stridor. No wheezing, rhonchi or rales.  Chest:     Chest wall: No tenderness.  Abdominal:     General: Bowel sounds are normal. There is no distension.     Palpations: Abdomen is soft. There is no mass.     Tenderness: There is no abdominal tenderness. There is no right CVA tenderness, left CVA tenderness, guarding or rebound.  Hernia: No hernia is present.  Musculoskeletal:        General: No swelling, tenderness, deformity or signs of injury. Normal range of motion.     Right lower leg: No edema.     Left lower leg: No edema.  Lymphadenopathy:     Cervical: No cervical adenopathy.  Skin:    General: Skin is warm and dry.     Capillary Refill: Capillary refill takes less than 2 seconds.     Coloration: Skin is not jaundiced or pale.     Findings: No bruising, erythema, lesion or rash.  Neurological:     General: No focal deficit present.     Mental Status: He is alert and oriented to person, place, and time.     Cranial Nerves: No cranial nerve deficit.     Sensory: No sensory deficit.     Motor: Tremor present. No weakness.     Coordination: Coordination normal.     Gait: Gait normal.     Deep Tendon Reflexes: Reflexes normal.     Comments:  Bilateral upper extremity and facial tremors  Slow getting out of chair and off exam table.    Psychiatric:        Mood and Affect: Mood normal.        Behavior: Behavior normal.        Thought Content: Thought content normal.        Judgment: Judgment normal.       Assessment & Plan:  1. Routine general medical examination at a health care facility - Continue to stay active and eat healthy  - Follow up in one year or sooner if needed - CBC with Differential/Platelet - Comprehensive metabolic panel - Hemoglobin A1c - Lipid panel - TSH  2. Diabetes mellitus without complication ([place]) - Consider change in dose of Metformin  - CBC with Differential/Platelet - Comprehensive metabolic panel - Hemoglobin A1c - Lipid panel - TSH  3. Dyslipidemia - Continue current medications  - CBC with Differential/Platelet - Comprehensive metabolic panel - Hemoglobin A1c - Lipid panel - TSH  4. Coronary artery disease involving native coronary artery of native heart without angina pectoris - Continue current medications  - CBC with Differential/Platelet - Comprehensive metabolic panel - Hemoglobin A1c - Lipid panel - TSH  5. AV BLOCK, COMPLETE - Follow up with Cardiology as directed  6. Essential hypertension - well controlled. No change in medications  - CBC with Differential/Platelet - Comprehensive metabolic panel - Hemoglobin A1c - Lipid panel - TSH  7. Primary osteoarthritis of both knees - Follow up with orthopedics as directed  8. Prostate cancer screening  - PSA  9. Warfarin anticoagulation - Follow up with Coumadin Clinic as directed  10. Aortic valve replaced - Follow up with Cardiology as directed - CBC with Differential/Platelet - Comprehensive metabolic panel - Hemoglobin A1c - Lipid panel - TSH  11. PACEMAKER, PERMANENT - Follow up with Cardiology as directed  12. Parkinson's disease ([place]) - Continue with Levodopa and follow up with neurology    - I think PT would be a great idea for him    [name], NP

## 2020-01-10 ENCOUNTER — Telehealth: Payer: Medicare HMO

## 2020-01-16 ENCOUNTER — Ambulatory Visit (INDEPENDENT_AMBULATORY_CARE_PROVIDER_SITE_OTHER): Payer: Medicare HMO | Admitting: *Deleted

## 2020-01-16 ENCOUNTER — Other Ambulatory Visit: Payer: Self-pay

## 2020-01-16 DIAGNOSIS — Z5181 Encounter for therapeutic drug level monitoring: Secondary | ICD-10-CM | POA: Diagnosis not present

## 2020-01-16 DIAGNOSIS — Z952 Presence of prosthetic heart valve: Secondary | ICD-10-CM | POA: Diagnosis not present

## 2020-01-16 DIAGNOSIS — Z09 Encounter for follow-up examination after completed treatment for conditions other than malignant neoplasm: Secondary | ICD-10-CM

## 2020-01-16 LAB — POCT INR: INR: 2.2 (ref 2.0–3.0)

## 2020-01-16 NOTE — Patient Instructions (Signed)
Description   Continue taking Warfarin 1 tablet every day except 1/2 tablet on Sundays. Recheck in 6 weeks. Call with any changes. # [phone].

## 2020-01-23 ENCOUNTER — Other Ambulatory Visit: Payer: Self-pay | Admitting: Cardiology

## 2020-01-23 ENCOUNTER — Other Ambulatory Visit: Payer: Self-pay | Admitting: Adult Health

## 2020-01-24 DIAGNOSIS — M13861 Other specified arthritis, right knee: Secondary | ICD-10-CM | POA: Diagnosis not present

## 2020-01-24 DIAGNOSIS — M13862 Other specified arthritis, left knee: Secondary | ICD-10-CM | POA: Diagnosis not present

## 2020-02-07 ENCOUNTER — Ambulatory Visit (INDEPENDENT_AMBULATORY_CARE_PROVIDER_SITE_OTHER): Payer: Medicare HMO | Admitting: *Deleted

## 2020-02-07 DIAGNOSIS — I442 Atrioventricular block, complete: Secondary | ICD-10-CM | POA: Diagnosis not present

## 2020-02-07 LAB — CUP PACEART REMOTE DEVICE CHECK
Battery Remaining Longevity: 128 mo
Battery Remaining Percentage: 95.5 %
Battery Voltage: 2.98 V
Brady Statistic AP VP Percent: 27 %
Brady Statistic AP VS Percent: 1 %
Brady Statistic AS VP Percent: 72 %
Brady Statistic AS VS Percent: 1 %
Brady Statistic RA Percent Paced: 27 %
Brady Statistic RV Percent Paced: 99 %
Date Time Interrogation Session: 20210729040930
Implantable Lead Implant Date: 20050816
Implantable Lead Implant Date: 20050816
Implantable Lead Location: 753859
Implantable Lead Location: 753860
Implantable Pulse Generator Implant Date: 20171026
Lead Channel Impedance Value: 510 Ohm
Lead Channel Impedance Value: 730 Ohm
Lead Channel Pacing Threshold Amplitude: 0.5 V
Lead Channel Pacing Threshold Amplitude: 1 V
Lead Channel Pacing Threshold Pulse Width: 0.5 ms
Lead Channel Pacing Threshold Pulse Width: 0.5 ms
Lead Channel Sensing Intrinsic Amplitude: 12 mV
Lead Channel Sensing Intrinsic Amplitude: 3.2 mV
Lead Channel Setting Pacing Amplitude: 0.75 V
Lead Channel Setting Pacing Amplitude: 2 V
Lead Channel Setting Pacing Pulse Width: 0.5 ms
Lead Channel Setting Sensing Sensitivity: 4 mV
Pulse Gen Model: 2272
Pulse Gen Serial Number: 3182792

## 2020-02-08 DIAGNOSIS — M25512 Pain in left shoulder: Secondary | ICD-10-CM | POA: Diagnosis not present

## 2020-02-11 NOTE — Progress Notes (Signed)
Remote pacemaker transmission.   

## 2020-02-13 ENCOUNTER — Other Ambulatory Visit: Payer: Self-pay | Admitting: Orthopedic Surgery

## 2020-02-13 DIAGNOSIS — M25512 Pain in left shoulder: Secondary | ICD-10-CM

## 2020-02-14 DIAGNOSIS — G629 Polyneuropathy, unspecified: Secondary | ICD-10-CM | POA: Diagnosis not present

## 2020-02-14 DIAGNOSIS — G20 Parkinson's disease: Secondary | ICD-10-CM | POA: Diagnosis not present

## 2020-02-14 DIAGNOSIS — E1142 Type 2 diabetes mellitus with diabetic polyneuropathy: Secondary | ICD-10-CM | POA: Diagnosis not present

## 2020-02-19 ENCOUNTER — Other Ambulatory Visit: Payer: Self-pay

## 2020-02-19 ENCOUNTER — Ambulatory Visit
Admission: RE | Admit: 2020-02-19 | Discharge: 2020-02-19 | Disposition: A | Discharge status: Home / Self Care | Payer: Medicare HMO | Source: Ambulatory Visit | Attending: Orthopedic Surgery | Admitting: Orthopedic Surgery

## 2020-02-19 DIAGNOSIS — S46012A Strain of muscle(s) and tendon(s) of the rotator cuff of left shoulder, initial encounter: Secondary | ICD-10-CM | POA: Diagnosis not present

## 2020-02-19 DIAGNOSIS — M25512 Pain in left shoulder: Secondary | ICD-10-CM

## 2020-02-19 DIAGNOSIS — M25612 Stiffness of left shoulder, not elsewhere classified: Secondary | ICD-10-CM | POA: Diagnosis not present

## 2020-02-19 MED ORDER — IOPAMIDOL (ISOVUE-M 200) INJECTION 41%
1.0000 mL | Freq: Once | INTRAMUSCULAR | Status: AC
  Administered 2020-02-19: 1 mL via INTRA_ARTICULAR

## 2020-02-25 ENCOUNTER — Telehealth: Payer: Self-pay

## 2020-02-25 ENCOUNTER — Other Ambulatory Visit: Payer: Self-pay

## 2020-02-25 ENCOUNTER — Ambulatory Visit: Payer: Medicare HMO

## 2020-02-25 DIAGNOSIS — I2510 Atherosclerotic heart disease of native coronary artery without angina pectoris: Secondary | ICD-10-CM

## 2020-02-25 DIAGNOSIS — I10 Essential (primary) hypertension: Secondary | ICD-10-CM

## 2020-02-25 DIAGNOSIS — E785 Hyperlipidemia, unspecified: Secondary | ICD-10-CM

## 2020-02-25 DIAGNOSIS — Z952 Presence of prosthetic heart valve: Secondary | ICD-10-CM

## 2020-02-25 DIAGNOSIS — E119 Type 2 diabetes mellitus without complications: Secondary | ICD-10-CM

## 2020-02-25 NOTE — Chronic Care Management (AMB) (Signed)
Chronic Care Management Pharmacy  Name: [name]  MRN: [id number] DOB: May 10, 1946  Initial Questions: 1. Have you seen any other providers since your last visit? n/a 2. Any changes in your medicines or health? n/a  Chief Complaint/ HPI  [name],  74 y.o. , male presents for their Initial CCM visit with the clinical pharmacist via telephone due to COVID-19 Pandemic.  Patient and spouse were both presented for CCM visit. Patient reported no questions or concerns for medications. 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   He reports he weighs himself two to three times per week. Current weight: 197 lbs.   He reports wearing compression socks.   Spouse reports she manages patient's medications (she organizes them and orders refills). She reports she drives patient to office visits.   PCP : [name], NP  Their chronic conditions include: DM, HTN, HLD, CAD, Aortic valve replacement, heart failure, Parkinson's disease, Asthma,  Allergic rhinitis, Gout, Osteoarthritis   Office Visits: -01/09/20 [name], NP: Patient presented for annual exam.  No medication changes. Completed labs: A1C 6.5,  LDL 60, HDL 51.1, TGs 97  -10/16/19 [name], NP: Patient presented for diabetes follow up. A1C 5.8. Decreased metformin from 1000 mg to 500 mg BID.  Consult Visit: -02/08/20 Lacie Draft [facility]): pain of left shoulder joint diagnosis  -01/24/20 [name] [facility]): bilateral arthritis of knees diagnosis  -01/23/20 Melody Combs, RN (Neurology): Pt reduced dose of Sinemet to 25/100 mg to 1 tab TID and is feeling much better without any nausea and symptom control.   -01/16/20 Candance Hemphill RN (cardiology): INR 2.2 (goal 2-3), 5 mg every Sun and 10 mg all other days. Recheck in 6 weeks.  -11/28/19 Dr. Lula Olszewski (Neurology): Patient presented for evaluation of Parkinson's disease. Patient has some dysphagia for large pills, tremor in left hand and chin, and  is slow walking and has tripped. Mirapex and Requip previously stopped due to hallucinations and feet swelling. Retrial of Sinemet 25/100 mg up to 2 tablets TID with previous stomach upset.  -11/08/19 Cristopher Peru (Cardiology): atrioventricular block diagnosis  -11/06/19 Griselda Miner (Dermatology): history of malignant neoplasm of skin, basal cell carcinoma of other parts of face diagnosis  -10/01/19 Dr. Star Age (Neurology): Patient presented for Parkinson's disease follow up. Pt reported a fall and still shaking. Dose of Requip was previously decreased to 0.5 mg TID due to swelling and fatigue and recommended switch to Rotigotine 2 mg/24HR patch daily.   Medications: Outpatient Encounter Medications as of 02/25/2020  Medication Sig  . Accu-Chek FastClix Lancets MISC USE TO CHECK BLOOD SUGAR TWICE A DAY AND AS NEEDED  . acetaminophen (TYLENOL ARTHRITIS PAIN) 650 MG CR tablet Take 1,300 mg by mouth 2 (two) times daily.  Marland Kitchen albuterol (VENTOLIN HFA) 108 (90 Base) MCG/ACT inhaler Inhale 2 puffs into the lungs every 6 (six) hours as needed for wheezing or shortness of breath.  . Ascorbic Acid (VITAMIN C) 1000 MG tablet Take 1,000 mg by mouth daily.   Marland Kitchen aspirin 81 MG tablet Take 81 mg by mouth every evening.   Marland Kitchen atorvastatin (LIPITOR) 40 MG tablet Take 1 tablet (40 mg total) by mouth daily at 6 PM.  . calcium carbonate (OS-CAL) 600 MG TABS Take 600 mg by mouth 2 (two) times daily with a meal.  . carbidopa-levodopa (SINEMET IR) 25-100 MG tablet Take 1 tablet by mouth 3 (three) times daily.   . cetirizine (ZYRTEC) 10 MG tablet Take 10 mg by mouth daily.   Marland Kitchen  Cholecalciferol (VITAMIN D3) 125 MCG (5000 UT) TABS Take 1 tablet by mouth daily.  . fluticasone (FLONASE) 50 MCG/ACT nasal spray Place 2 sprays into both nostrils daily.  . furosemide (LASIX) 40 MG tablet TAKE 1 TABLET TWICE DAILY (Patient taking differently: Take 40 mg by mouth daily. )  . glucose blood (ACCU-CHEK SMARTVIEW) test strip TEST  BLOOD SUGAR TWICE DAILY AND AS NEEDED  . guaiFENesin-dextromethorphan (ROBITUSSIN DM) 100-10 MG/5ML syrup Take 5 mLs by mouth every 4 (four) hours as needed for cough.  . losartan (COZAAR) 100 MG tablet Take 1 tablet (100 mg total) by mouth daily.  . melatonin 5 MG TABS Take 5 mg by mouth at bedtime.  . metFORMIN (GLUCOPHAGE) 1000 MG tablet TAKE 1 TABLET TWICE DAILY WITH MEALS (Patient taking differently: 500 mg 2 (two) times daily with a meal. )  . metoprolol succinate (TOPROL-XL) 100 MG 24 hr tablet TAKE 1 TABLET EVERY DAY  . Misc Natural Products (TART CHERRY ADVANCED) CAPS Take 2 capsules by mouth daily.   . Multiple Vitamin (MULTIVITAMIN) tablet Take 1 tablet by mouth daily.    . nitroGLYCERIN (NITROSTAT) 0.4 MG SL tablet Place 1 tablet (0.4 mg total) under the tongue every 5 (five) minutes as needed. For chest pain  . Omega-3 Fatty Acids (FISH OIL) 1200 MG CAPS Take 1,200 mg by mouth daily.   . potassium chloride (KLOR-CON) 10 MEQ tablet TAKE 1 TABLET TWICE DAILY (Patient taking differently: Take 10 mEq by mouth every morning. )  . Probiotic Product (PHILLIPS COLON HEALTH PO) Take 1 capsule by mouth daily.  . vitamin B-12 (CYANOCOBALAMIN) 1000 MCG tablet Take 1,000 mcg by mouth daily.  Marland Kitchen warfarin (COUMADIN) 10 MG tablet TAKE 1 TABLET DAILY OR AS DIRECTED BY COUMADIN CLINIC   No facility-administered encounter medications on file as of 02/25/2020.     Current Diagnosis/Assessment:  Goals Addressed            This Visit's Progress   . Pharmacy Care Plan       CARE PLAN ENTRY (see longitudinal plan of care for additional care plan information)  Current Barriers:  . Chronic Disease Management support, education, and care coordination needs related to Hypertension, Hyperlipidemia, Diabetes, Coronary Artery Disease, and Aortic valve replacement    Hypertension BP Readings from Last 3 Encounters:  01/09/20 120/84  10/16/19 118/70  10/01/19 (!) 147/85   . Pharmacist Clinical  Goal(s): o Over the next 120 days, patient will work with PharmD and providers to maintain BP goal <130/80 . Current regimen:   Losartan 100 mg daily   Metoprolol succinate 100 mg daily . Interventions: o Discussed fall risk precautions.  . Patient self care activities - Over the next 120 days, patient will: o Check BP several times per month, document, and provide at future appointments o Ensure daily salt intake < 2300 mg/day  Hyperlipidemia Lab Results  Component Value Date/Time   LDLCALC 60 01/09/2020 10:42 AM   . Pharmacist Clinical Goal(s): o Over the next 120 days, patient will work with PharmD and providers to maintain LDL goal < 70 . Current regimen:  . Atorvastatin 40 mg daily at 6PM  . Fish oil 1,200 mg daily . Patient self care activities - Over the next 120 days, patient will: o Continue current medications as directed by providers.   Diabetes Lab Results  Component Value Date/Time   HGBA1C 6.2 01/09/2020 10:42 AM   HGBA1C 5.8 10/16/2019 01:10 PM   HGBA1C 6.3 04/09/2019 10:06 AM  HGBA1C 6.0 09/28/2018 08:22 AM   . Pharmacist Clinical Goal(s): o Over the next 120 days, patient will work with PharmD and providers to maintain A1c goal <7% . Current regimen:  o Metformin 500 mg twice daily . Interventions: o Discussed maintain a low carbohydrate diet, eating 45 grams of carbohydrates per meal (3 servings of carbohydrates per meal), 15 grams of carbohydrates per snack (1 serving of carbohydrate per snack).  . Patient self care activities - Over the next 120 days, patient will: o Check blood sugar twice weekly, document, and provide at future appointments o Contact provider with any episodes of hypoglycemia  Coronary artery disease . Pharmacist Clinical Goal(s) o Over the next 120 days, patient will work with PharmD and providers to prevent/ decrease risk of cardiac events.  . Current regimen:   Nitroglycerin 0.4 mg as needed for chest pain  Atorvastatin 40  mg daily  Aspirin 81 mg daily . Interventions: o Recommend verifying expiration date of nitroglycerin and replacing as needed.  . Patient self care activities - Over the next 120 days, patient will: o Continue current medications as directed by provider.   Aortic valve replacement . Pharmacist Clinical Goal(s) o Over the next 120 days, patient will work with PharmD and providers to maintain INR: 2.0 to 3.0  . Current regimen:  o Warfarin 27m, as directed by Coumadin clinic.  . Interventions: o We discussed:   o - monitoring for signs and symptoms for bleeding (coughing up blood, prolonged nose bleeds, black, tarry stools). . Patient self care activities o Patient will continue current medications as directed by providers.   Medication management . Pharmacist Clinical Goal(s): o Over the next 120 days, patient will work with PharmD and providers to maintain optimal medication adherence . [facility] Interventions o Comprehensive medication review performed. o Continue current medication management strategy . Patient self care activities - Over the next 120 days, patient will: o Take medications as prescribed o Report any questions or concerns to PharmD and/or provider(s)  Initial goal documentation       SDOH Interventions     Most Recent Value  SDOH Interventions  Financial Strain Interventions Intervention Not Indicated  Transportation Interventions Intervention Not Indicated       Diabetes   Recent Relevant Labs: Lab Results  Component Value Date/Time   HGBA1C 6.2 01/09/2020 10:42 AM   HGBA1C 5.8 10/16/2019 01:10 PM   HGBA1C 6.3 04/09/2019 10:06 AM   HGBA1C 6.0 09/28/2018 08:22 AM   MICROALBUR 0.8 10/11/2017 09:05 AM   MICROALBUR <0.7 10/11/2016 01:27 PM    Checking BG: 2x per week  Recent FBG Readings: 90   Patient has failed these meds in past: glipizide (weakness/shakiness with borderline hypoglycemia)   Patient  is currently controlled on the following medications:   Metformin 500 mg twice daily  Last diabetic Eye exam:  Lab Results  Component Value Date/Time   HMDIABEYEEXA No Retinopathy 06/25/2016 12:00 AM  -  Obtains once a year; last visit was 2 weeks   Last diabetic Foot exam: No results found for: HMDIABFOOTEX   We discussed: diet and exercise extensively . Maintain a low carbohydrate diet, eating 45 grams of carbohydrates per meal (3 servings of carbohydrates per meal), 15 grams of carbohydrates per snack (1 serving of carbohydrate per snack).   Plan Continue current medications   Hypertension   Denies dizziness/ lightheadedness/ orthostatic hypotension.   Office blood pressures are:  BP Readings from Last  3 Encounters:  01/09/20 120/84  10/16/19 118/70  10/01/19 (!) 147/85   Patient has failed these meds in the past: ramipril (rash, cough),   Patient checks BP at home several times per month  Patient home BP readings are ranging: unable to provide   Patient is currently controlled on the following medications:   Losartan 100 mg daily   Metoprolol succinate 100 mg daily   Plan Continue current medications   Hyperlipidemia   LDL goal < 70  Lipid Panel     Component Value Date/Time   CHOL 130 01/09/2020 1042   TRIG 97.0 01/09/2020 1042   TRIG 91 06/14/2006 0825   HDL 51.10 01/09/2020 1042   LDLCALC 60 01/09/2020 1042    Hepatic Function Latest Ref Rng & Units 01/09/2020 12/28/2018 03/09/2018  Total Protein 6.0 - 8.3 g/dL 6.2 5.9(L) 6.5  Albumin 3.5 - 5.2 g/dL 4.4 4.1 4.1  AST 0 - 37 U/L _0 ALT 0 - 53 U/L _1 Alk Phosphatase 39 - 117 U/L 49 51 41  Total Bilirubin 0.2 - 1.2 mg/dL 0.7 0.5 0.8  Bilirubin, Direct 0.0 - 0.3 mg/dL - - -     The 10-year ASCVD risk score Mikey Bussing DC Jr., et al., 2013) is: 38.7%   Values used to calculate the score:     Age: 2 years     Sex: Male     Is Non-Hispanic African American: No     Diabetic: Yes     Tobacco  smoker: No     Systolic Blood Pressure: 297 mmHg     Is BP treated: Yes     HDL Cholesterol: 51.1 mg/dL     Total Cholesterol: 130 mg/dL   Patient has failed these meds in past: pravastatin (switched to high intensity atorvastatin), rosuvastatin (cost)  Patient is currently controlled on the following medications:  . Atorvastatin 40 mg daily at 6PM  . Fish oil 1,200 mg daily  Plan Continue current medications    CAD   Reports never has needed to use nitroglycerin   Patient is currently controlled on the following medications:   Nitroglycerin 0.4 mg as needed for chest pain  Atorvastatin 40 mg daily  Aspirin 81 mg daily  Plan Continue current medications   Aortic Valve Replacement (2005)    Lab Results  Component Value Date   INR 2.2 01/16/2020   INR 2.6 12/12/2019   INR 1.9 (A) 11/12/2019   PROTIME 19.0 12/26/2008   PROTIME 25.1 12/16/2008   Patient has failed these meds in past: none Patient is currently controlled on the following medications:  . Warfarin 5 mg every Sun and 10 mg all other days  We discussed:   - monitoring for signs and symptoms for bleeding (coughing up blood, prolonged nose bleeds, black, tarry stools).  Plan Continue current medications   Heart Failure   Type: Diastolic  Last ejection fraction: 55%  Patient has failed these meds in past: none Patient is currently controlled on the following medications:  Furosemide 40 mg once daily   Metoprolol succinate 100 mg daily  Additional medications:  Potassium chloride 10 MEQ once daily  BMP Latest Ref Rng & Units 01/09/2020 12/28/2018 03/09/2018  Potassium 3.5 - 5.1 mEq/L 4.1 4.4 4.6    We discussed weighing daily; if you gain more than 3 pounds in one day or 5 pounds in one week call your doctor  Plan Continue current medications  Parkinson's Disease   -  Reports did not tolerate 2 tabs TID due to stomach upset and edema - Reports current dose is controlling shakiness and  no more edema   Patient has failed these meds in past: Requip (ankle swelling), Mirapex (hallucinations), Sinemet (stomach upset)  Patient is currently controlled on the following medications:  . Sinemet 25/100 mg, 1 tablet three times daily  Plan Continue current medications  Managed by neurology.   Asthma    - reports use: 4-5x/ weeks and mostly 1 puff before bedtime   Patient has failed these meds in past: none Patient is currently controlled on the following medications:  . Albuterol two puffs every 6 hours as needed for wheezing or shortness of breath  Plan Continue current medications   Allergic Rhinitis   Patient has failed these meds in past: none Patient is currently controlled on the following medications:  . Cetirizine 10 mg daily (morning)  . Flonase 2 sprays into both nostrils daily  Plan Continue current medications  Gout   - Reports no flare ups since being on this   Patient has failed these meds in past: unable to remember/ not found in chart  Patient is currently controlled on the following medications:   Tart cherry two tablets once daily  Plan Continue current medications  Osteoarthritis    Pain level: 0 out of 10    Patient has failed these meds in past: none  Patient is currently controlled on the following medications:  . Acetaminophen 62m, 2 tablets twice daily   Plan Continue current medications   Miscellaneous   Patient is currently on the following medications:  .Marland KitchenVitamin C 1000 mg once daily  . Vitamin B12 1000 mg daily (morning)  . Phillips colon health daily (breakfast) . Multivitamin daily (morning)  . Calcium carbonate 600 mg twice daily with meals . Vitamin D 5000 units, 1 tablet once daily   Plan Continue current medications   Vaccines   Reviewed and discussed patient's vaccination history.    Immunization History  Administered Date(s) Administered  . Fluad Quad(high Dose 65+) 03/22/2019  . Influenza Split  03/31/2011, 04/10/2012  . Influenza Whole 04/04/2008, 05/09/2009, 03/18/2010  . Influenza, High Dose Seasonal PF 04/28/2015, 04/06/2016, 04/12/2017, 03/30/2018  . Influenza,inj,Quad PF,6+ Mos 04/02/2013  . Influenza-Unspecified 04/05/2014  . Pneumococcal Conjugate-13 02/13/2014  . Pneumococcal Polysaccharide-23 10/07/2011  . Tdap 10/07/2011, 07/16/2012  . Zoster 10/07/2011   [facility] 10/11/2017 2nd dose: 12/13/2017   Plan Recommended patient receive COVID vaccines (patient declined vaccine).    Medication Management  Patient organizes medications: - spouse organizes weekly pill box (AM and PM) and organizes Sinemet separately  Primary Pharmacy: [facility] Adherence: No gaps in refill history per medication dispense history 08/29/19 - 02/25/20    Follow up: Follow up visit with PharmD in 4 months Will conduct general telephone calls for periodic check-ins before next visit.    AAnson Crofts PharmD Clinical Pharmacist [facility] at [place]([phone]

## 2020-02-25 NOTE — Patient Instructions (Addendum)
Visit Information  Goals Addressed            This Visit's Progress   . Pharmacy Care Plan       CARE PLAN ENTRY (see longitudinal plan of care for additional care plan information)  Current Barriers:  . Chronic Disease Management support, education, and care coordination needs related to Hypertension, Hyperlipidemia, Diabetes, Coronary Artery Disease, and Aortic valve replacement    Hypertension BP Readings from Last 3 Encounters:  01/09/20 120/84  10/16/19 118/70  10/01/19 (!) 147/85   . Pharmacist Clinical Goal(s): o Over the next 120 days, patient will work with PharmD and providers to maintain BP goal <130/80 . Current regimen:   Losartan 100 mg daily   Metoprolol succinate 100 mg daily . Interventions: o Discussed fall risk precautions.  . Patient self care activities - Over the next 120 days, patient will: o Check BP several times per month, document, and provide at future appointments o Ensure daily salt intake < 2300 mg/day  Hyperlipidemia Lab Results  Component Value Date/Time   LDLCALC 60 01/09/2020 10:42 AM   . Pharmacist Clinical Goal(s): o Over the next 120 days, patient will work with PharmD and providers to maintain LDL goal < 70 . Current regimen:  . Atorvastatin 40 mg daily at 6PM  . Fish oil 1,200 mg daily . Patient self care activities - Over the next 120 days, patient will: o Continue current medications as directed by providers.   Diabetes Lab Results  Component Value Date/Time   HGBA1C 6.2 01/09/2020 10:42 AM   HGBA1C 5.8 10/16/2019 01:10 PM   HGBA1C 6.3 04/09/2019 10:06 AM   HGBA1C 6.0 09/28/2018 08:22 AM   . Pharmacist Clinical Goal(s): o Over the next 120 days, patient will work with PharmD and providers to maintain A1c goal <7% . Current regimen:  o Metformin 500 mg twice daily . Interventions: o Discussed maintain a low carbohydrate diet, eating 45 grams of carbohydrates per meal (3 servings of carbohydrates per meal), 15 grams  of carbohydrates per snack (1 serving of carbohydrate per snack).  . Patient self care activities - Over the next 120 days, patient will: o Check blood sugar twice weekly, document, and provide at future appointments o Contact provider with any episodes of hypoglycemia  Coronary artery disease . Pharmacist Clinical Goal(s) o Over the next 120 days, patient will work with PharmD and providers to prevent/ decrease risk of cardiac events.  . Current regimen:   Nitroglycerin 0.4 mg as needed for chest pain  Atorvastatin 40 mg daily  Aspirin 81 mg daily . Interventions: o Recommend verifying expiration date of nitroglycerin and replacing as needed.  . Patient self care activities - Over the next 120 days, patient will: o Continue current medications as directed by provider.   Aortic valve replacement . Pharmacist Clinical Goal(s) o Over the next 120 days, patient will work with PharmD and providers to maintain INR: 2.0 to 3.0  . Current regimen:  o Warfarin 10mg , as directed by Coumadin clinic.  . Interventions: o We discussed:   o - monitoring for signs and symptoms for bleeding (coughing up blood, prolonged nose bleeds, black, tarry stools). . Patient self care activities o Patient will continue current medications as directed by providers.   Medication management . Pharmacist Clinical Goal(s): o Over the next 120 days, patient will work with PharmD and providers to maintain optimal medication adherence . Current pharmacy: [facility] . Interventions o Comprehensive medication review performed.  o Continue current medication management strategy . Patient self care activities - Over the next 120 days, patient will: o Take medications as prescribed o Report any questions or concerns to PharmD and/or provider(s)  Initial goal documentation        [name] was given information about Chronic Care Management services today including:  1. CCM service  includes personalized support from designated clinical staff supervised by his physician, including individualized plan of care and coordination with other care providers 2. 24/7 contact phone numbers for assistance for urgent and routine care needs. 3. Standard insurance, coinsurance, copays and deductibles apply for chronic care management only during months in which we provide at least 20 minutes of these services. Most insurances cover these services at 100%, however patients may be responsible for any copay, coinsurance and/or deductible if applicable. This service may help you avoid the need for more expensive face-to-face services. 4. Only one practitioner may furnish and bill the service in a calendar month. 5. The patient may stop CCM services at any time (effective at the end of the month) by phone call to the office staff.  Patient agreed to services and verbal consent obtained.   The patient verbalized understanding of instructions provided today and agreed to receive a mailed copy of patient instruction and/or educational materials. Telephone follow up appointment with pharmacy team member scheduled for:  06/20/2020   Anson Crofts, PharmD, BCACP Clinical Pharmacist [facility] [place] [phone]   Heart-Healthy Eating Plan Heart-healthy meal planning includes:  Eating less unhealthy fats.  Eating more healthy fats.  Making other changes in your diet. Talk with your doctor or a diet specialist (dietitian) to create an eating plan that is right for you. What is my plan? Your doctor may recommend an eating plan that includes:  Total fat: ______% or less of total calories a day.  Saturated fat: ______% or less of total calories a day.  Cholesterol: less than _________mg a day. What are tips for following this plan? Cooking Avoid frying your food. Try to bake, boil, grill, or broil it instead. You can also reduce fat by:  Removing the skin from  poultry.  Removing all visible fats from meats.  Steaming vegetables in water or broth. Meal planning   At meals, divide your plate into four equal parts: ? Fill one-half of your plate with vegetables and green salads. ? Fill one-fourth of your plate with whole grains. ? Fill one-fourth of your plate with lean protein foods.  Eat 4-5 servings of vegetables per day. A serving of vegetables is: ? 1 cup of raw or cooked vegetables. ? 2 cups of raw leafy greens.  Eat 4-5 servings of fruit per day. A serving of fruit is: ? 1 medium whole fruit. ?  cup of dried fruit. ?  cup of fresh, frozen, or canned fruit. ?  cup of 100% fruit juice.  Eat more foods that have soluble fiber. These are apples, broccoli, carrots, beans, peas, and barley. Try to get 20-30 g of fiber per day.  Eat 4-5 servings of nuts, legumes, and seeds per week: ? 1 serving of dried beans or legumes equals  cup after being cooked. ? 1 serving of nuts is  cup. ? 1 serving of seeds equals 1 tablespoon. General information  Eat more home-cooked food. Eat less restaurant, buffet, and fast food.  Limit or avoid alcohol.  Limit foods that are high in starch and sugar.  Avoid fried foods.  Lose weight if you are overweight.  Keep track of how much salt (sodium) you eat. This is important if you have high blood pressure. Ask your doctor to tell you more about this.  Try to add vegetarian meals each week. Fats  Choose healthy fats. These include olive oil and canola oil, flaxseeds, walnuts, almonds, and seeds.  Eat more omega-3 fats. These include salmon, mackerel, sardines, tuna, flaxseed oil, and ground flaxseeds. Try to eat fish at least 2 times each week.  Check food labels. Avoid foods with trans fats or high amounts of saturated fat.  Limit saturated fats. ? These are often found in animal products, such as meats, butter, and cream. ? These are also found in plant foods, such as palm oil, palm kernel  oil, and coconut oil.  Avoid foods with partially hydrogenated oils in them. These have trans fats. Examples are stick margarine, some tub margarines, cookies, crackers, and other baked goods. What foods can I eat? Fruits All fresh, canned (in natural juice), or frozen fruits. Vegetables Fresh or frozen vegetables (raw, steamed, roasted, or grilled). Green salads. Grains Most grains. Choose whole wheat and whole grains most of the time. Rice and pasta, including brown rice and pastas made with whole wheat. Meats and other proteins Lean, well-trimmed beef, veal, pork, and lamb. Chicken and [place] without skin. All fish and shellfish. Wild duck, rabbit, pheasant, and venison. Egg whites or low-cholesterol egg substitutes. Dried beans, peas, lentils, and tofu. Seeds and most nuts. Dairy Low-fat or nonfat cheeses, including ricotta and mozzarella. Skim or 1% milk that is liquid, powdered, or evaporated. Buttermilk that is made with low-fat milk. Nonfat or low-fat yogurt. Fats and oils Non-hydrogenated (trans-free) margarines. Vegetable oils, including soybean, sesame, sunflower, olive, peanut, safflower, corn, canola, and cottonseed. Salad dressings or mayonnaise made with a vegetable oil. Beverages Mineral water. Coffee and tea. Diet carbonated beverages. Sweets and desserts Sherbet, gelatin, and fruit ice. Small amounts of dark chocolate. Limit all sweets and desserts. Seasonings and condiments All seasonings and condiments. The items listed above may not be a complete list of foods and drinks you can eat. Contact a dietitian for more options. What foods should I avoid? Fruits Canned fruit in heavy syrup. Fruit in cream or butter sauce. Fried fruit. Limit coconut. Vegetables Vegetables cooked in cheese, cream, or butter sauce. Fried vegetables. Grains Breads that are made with saturated or trans fats, oils, or whole milk. Croissants. Sweet rolls. Donuts. High-fat crackers, such as cheese  crackers. Meats and other proteins Fatty meats, such as hot dogs, ribs, sausage, bacon, rib-eye roast or steak. High-fat deli meats, such as salami and bologna. Caviar. Domestic duck and goose. Organ meats, such as liver. Dairy Cream, sour cream, cream cheese, and creamed cottage cheese. Whole-milk cheeses. Whole or 2% milk that is liquid, evaporated, or condensed. Whole buttermilk. Cream sauce or high-fat cheese sauce. Yogurt that is made from whole milk. Fats and oils Meat fat, or shortening. Cocoa butter, hydrogenated oils, palm oil, coconut oil, palm kernel oil. Solid fats and shortenings, including bacon fat, salt pork, lard, and butter. Nondairy cream substitutes. Salad dressings with cheese or sour cream. Beverages Regular sodas and juice drinks with added sugar. Sweets and desserts Frosting. Pudding. Cookies. Cakes. Pies. Milk chocolate or white chocolate. Buttered syrups. Full-fat ice cream or ice cream drinks. The items listed above may not be a complete list of foods and drinks to avoid. Contact a dietitian for more information. Summary  Heart-healthy meal planning includes  eating less unhealthy fats, eating more healthy fats, and making other changes in your diet.  Eat a balanced diet. This includes fruits and vegetables, low-fat or nonfat dairy, lean protein, nuts and legumes, whole grains, and heart-healthy oils and fats. This information is not intended to replace advice given to you by your health care provider. Make sure you discuss any questions you have with your health care provider. Document Revised: 09/01/2017 Document Reviewed: 08/05/2017 Elsevier Patient Education  2020 [employer].

## 2020-02-27 ENCOUNTER — Other Ambulatory Visit: Payer: Self-pay

## 2020-02-27 ENCOUNTER — Ambulatory Visit (INDEPENDENT_AMBULATORY_CARE_PROVIDER_SITE_OTHER): Payer: Medicare HMO | Admitting: Pharmacist

## 2020-02-27 DIAGNOSIS — Z952 Presence of prosthetic heart valve: Secondary | ICD-10-CM | POA: Diagnosis not present

## 2020-02-27 DIAGNOSIS — Z5181 Encounter for therapeutic drug level monitoring: Secondary | ICD-10-CM | POA: Diagnosis not present

## 2020-02-27 DIAGNOSIS — Z7901 Long term (current) use of anticoagulants: Secondary | ICD-10-CM | POA: Diagnosis not present

## 2020-02-27 DIAGNOSIS — Z09 Encounter for follow-up examination after completed treatment for conditions other than malignant neoplasm: Secondary | ICD-10-CM

## 2020-02-27 LAB — POCT INR: INR: 2.2 (ref 2.0–3.0)

## 2020-02-27 NOTE — Patient Instructions (Addendum)
Description   Continue taking Warfarin 1 tablet every day except 1/2 tablet on Sundays. Recheck in 6 weeks. Call with any changes. # [phone].  Fax number: [phone]     

## 2020-02-29 DIAGNOSIS — M75122 Complete rotator cuff tear or rupture of left shoulder, not specified as traumatic: Secondary | ICD-10-CM | POA: Diagnosis not present

## 2020-02-29 DIAGNOSIS — M25512 Pain in left shoulder: Secondary | ICD-10-CM | POA: Diagnosis not present

## 2020-02-29 DIAGNOSIS — M19012 Primary osteoarthritis, left shoulder: Secondary | ICD-10-CM | POA: Diagnosis not present

## 2020-03-03 DIAGNOSIS — M25512 Pain in left shoulder: Secondary | ICD-10-CM | POA: Diagnosis not present

## 2020-03-11 NOTE — Progress Notes (Signed)
°  Chronic Care Management   Outreach Note  03/11/2020 Name: [name] MRN: [id number] DOB: 07-26-1945  Referred by: [name], NP Reason for referral : Chronic Care Management   An unsuccessful telephone outreach was attempted today. The patient was referred to the pharmacist for assistance with care management and care coordination.

## 2020-03-19 ENCOUNTER — Other Ambulatory Visit: Payer: Self-pay | Admitting: Adult Health

## 2020-03-19 ENCOUNTER — Other Ambulatory Visit: Payer: Self-pay | Admitting: Internal Medicine

## 2020-03-24 ENCOUNTER — Encounter: Payer: Self-pay | Admitting: Adult Health

## 2020-03-25 ENCOUNTER — Other Ambulatory Visit: Payer: Self-pay | Admitting: Adult Health

## 2020-03-25 MED ORDER — METFORMIN HCL 1000 MG PO TABS: 500.0000 mg | ORAL_TABLET | Freq: Two times a day (BID) | ORAL | 0 refills | Status: DC

## 2020-04-01 ENCOUNTER — Ambulatory Visit (INDEPENDENT_AMBULATORY_CARE_PROVIDER_SITE_OTHER): Payer: Medicare HMO | Admitting: *Deleted

## 2020-04-01 ENCOUNTER — Other Ambulatory Visit: Payer: Self-pay

## 2020-04-01 DIAGNOSIS — Z23 Encounter for immunization: Secondary | ICD-10-CM

## 2020-04-08 ENCOUNTER — Other Ambulatory Visit: Payer: Self-pay

## 2020-04-08 ENCOUNTER — Encounter: Payer: Self-pay | Admitting: Cardiology

## 2020-04-08 ENCOUNTER — Ambulatory Visit (INDEPENDENT_AMBULATORY_CARE_PROVIDER_SITE_OTHER): Payer: Medicare HMO | Admitting: Pharmacist

## 2020-04-08 ENCOUNTER — Ambulatory Visit: Payer: Medicare HMO | Admitting: Cardiology

## 2020-04-08 VITALS — BP 140/82 | HR 69 | Ht 66.0 in | Wt 203.2 lb

## 2020-04-08 DIAGNOSIS — I10 Essential (primary) hypertension: Secondary | ICD-10-CM

## 2020-04-08 DIAGNOSIS — Z5181 Encounter for therapeutic drug level monitoring: Secondary | ICD-10-CM | POA: Diagnosis not present

## 2020-04-08 DIAGNOSIS — Z952 Presence of prosthetic heart valve: Secondary | ICD-10-CM

## 2020-04-08 DIAGNOSIS — Z09 Encounter for follow-up examination after completed treatment for conditions other than malignant neoplasm: Secondary | ICD-10-CM | POA: Diagnosis not present

## 2020-04-08 DIAGNOSIS — I350 Nonrheumatic aortic (valve) stenosis: Secondary | ICD-10-CM

## 2020-04-08 LAB — POCT INR: INR: 2.5 (ref 2.0–3.0)

## 2020-04-08 NOTE — Patient Instructions (Signed)
Continue taking Warfarin 1 tablet every day except 1/2 tablet on Sundays. Recheck in 6 weeks. Call with any changes. # [phone].  Fax number: [phone]

## 2020-04-08 NOTE — Progress Notes (Signed)
Cardiology Office Note:    Date:  04/08/2020   ID:  [name], DOB 16-Apr-1946, MRN [id number]  PCP:  Jeremy Peng, NP  [facility] Cardiologist:  Candee Furbish, MD  [name] Electrophysiologist:  None   Referring MD: Jeremy Peng, NP     History of Present Illness:    [name] is a 74 y.o. male former patient of Dr. Ron Whitney.  Coronary disease prior CABG, pacemaker, aortic valve mechanical.  Dr. Lovena Whitney is monitoring his pacemaker.  Doing well.  Saint Jude pacer  Parkinson's.  Slowly progressive.  See below for further details.  Denies any fevers chills nausea vomiting syncope bleeding.  Past Medical History:  Diagnosis Date  . Acute renal failure ([place]) 02/09/2013  . Aortic stenosis 08/25/2010   a. Bentall aortic root replacement with a St. Jude mechanical valve and Hemashield conduit 02/2004.  Marland Kitchen Arthritis   . Asthma   . AV BLOCK, COMPLETE    a. s/p St Jude dual chamber pacemaker 02/2004.  Marland Kitchen CAD (coronary artery disease)    a. s/p CABGx2 (LIMA-dLAD, SVG-Cx). b. Low risk nuc 12/2012 without ischemia, EF 46% mild apical hypokinesia (EF 55% inf HK by echo).  . Carotid artery disease ([place])    a. 0-39% bilateral ICA stenosis, stable mild hard plaque in carotid bulbs. F/u 03/2014 recommended.  . Diabetes mellitus without complication ([place])    type 2   . DIVERTICULOSIS, COLON 10/17/2007  . Ejection fraction    a. EF 55% with inf HK, mild MR by echo 12/2012.  Marland Kitchen GERD (gastroesophageal reflux disease)    hxof   . GOUT 01/16/2007  . Headache    hx of migraines   . HEMORRHOIDS, INTERNAL 11/01/2008  . Hx of adenomatous polyp of colon 02/2019   no recall needed  . HYPERLIPIDEMIA 01/16/2007  . HYPERTENSION 01/16/2007  . Impaired glucose tolerance   . LEG EDEMA 11/27/2008  . Special screening for malignant neoplasm of prostate   . Warfarin anticoagulation    AVR    Past Surgical History:  Procedure Laterality Date  . AORTIC VALVE REPLACEMENT    . CARDIAC  CATHETERIZATION  06/2003  . CIRCUMCISION    . COLONOSCOPY    . CORONARY ARTERY BYPASS GRAFT    . coronary stents      prior to bypass   . EP IMPLANTABLE DEVICE N/A 05/06/2016   Procedure: PPM Generator Changeout;  Surgeon: Evans Lance, MD;  Location: [facility];  Service: Cardiovascular;  Laterality: N/A;  . ESOPHAGOGASTRODUODENOSCOPY    . HERNIA REPAIR    . INGUINAL HERNIA REPAIR Bilateral 02/07/2013   Procedure: LAPAROSCOPIC BILATERAL INGUINAL HERNIA REPAIR;  Surgeon: Imogene Burn. Jeremy Dover, MD;  Location: [facility];  Service: General;  Laterality: Bilateral;  . INSERTION OF MESH Bilateral 02/07/2013   Procedure: INSERTION OF MESH;  Surgeon: Imogene Burn. Jeremy Dover, MD;  Location: [facility];  Service: General;  Laterality: Bilateral;  . LUMBAR LAMINECTOMY/DECOMPRESSION MICRODISCECTOMY N/A 03/15/2018   Procedure: Complete decompressive lumbar laminectomy for spinal stenosis of L4-L5 and foraminotomy for L4-L5 root on right;  Surgeon: [name], MD;  Location: [facility];  Service: Orthopedics;  Laterality: N/A;  . PACEMAKER INSERTION    . SHOULDER SURGERY Bilateral   . SKIN BIOPSY  05/04/2019   BASAL CELL CARCINOMA//MID CENTRAL NASAL  . SKIN BIOPSY  10/11/2019   Superficial basal cell carcinoma - left superior central forehead shave    Current Medications: Current Meds  Medication Sig  .  Accu-Chek FastClix Lancets MISC USE TO CHECK BLOOD SUGAR TWICE A DAY AND AS NEEDED  . acetaminophen (TYLENOL ARTHRITIS PAIN) 650 MG CR tablet Take 1,300 mg by mouth 2 (two) times daily.  Marland Kitchen albuterol (VENTOLIN HFA) 108 (90 Base) MCG/ACT inhaler Inhale 2 puffs into the lungs every 6 (six) hours as needed for wheezing or shortness of breath.  . Ascorbic Acid (VITAMIN C) 1000 MG tablet Take 1,000 mg by mouth daily.   Marland Kitchen aspirin 81 MG tablet Take 81 mg by mouth every evening.   Marland Kitchen atorvastatin (LIPITOR) 40 MG tablet Take 1 tablet (40 mg total) by mouth daily at 6 PM.  . calcium carbonate (OS-CAL) 600 MG TABS Take  600 mg by mouth 2 (two) times daily with a meal.  . carbidopa-levodopa (SINEMET IR) 25-100 MG tablet Take 1 tablet by mouth 3 (three) times daily.   . cetirizine (ZYRTEC) 10 MG tablet Take 10 mg by mouth daily.   . Cholecalciferol (VITAMIN D3) 125 MCG (5000 UT) TABS Take 1 tablet by mouth daily.  . fluticasone (FLONASE) 50 MCG/ACT nasal spray Place 2 sprays into both nostrils daily.  . furosemide (LASIX) 40 MG tablet TAKE 1 TABLET TWICE DAILY (Patient taking differently: Take 40 mg by mouth daily. )  . glucose blood (ACCU-CHEK SMARTVIEW) test strip TEST BLOOD SUGAR TWICE DAILY AND AS NEEDED  . guaiFENesin-dextromethorphan (ROBITUSSIN DM) 100-10 MG/5ML syrup Take 5 mLs by mouth every 4 (four) hours as needed for cough.  . losartan (COZAAR) 100 MG tablet Take 1 tablet (100 mg total) by mouth daily. Please make yearly appt with Dr. Lovena Whitney for December for future refills. 1st attempt  . melatonin 5 MG TABS Take 5 mg by mouth at bedtime.  . metFORMIN (GLUCOPHAGE) 1000 MG tablet Take 0.5 tablets (500 mg total) by mouth 2 (two) times daily with a meal.  . metoprolol succinate (TOPROL-XL) 100 MG 24 hr tablet TAKE 1 TABLET EVERY DAY  . Misc Natural Products (TART CHERRY ADVANCED) CAPS Take 2 capsules by mouth daily.   . Multiple Vitamin (MULTIVITAMIN) tablet Take 1 tablet by mouth daily.    . nitroGLYCERIN (NITROSTAT) 0.4 MG SL tablet Place 1 tablet (0.4 mg total) under the tongue every 5 (five) minutes as needed. For chest pain  . Omega-3 Fatty Acids (FISH OIL) 1200 MG CAPS Take 1,200 mg by mouth daily.   . potassium chloride (KLOR-CON) 10 MEQ tablet TAKE 1 TABLET TWICE DAILY (Patient taking differently: Take 10 mEq by mouth every morning. )  . Probiotic Product (PHILLIPS COLON HEALTH PO) Take 1 capsule by mouth daily.  . vitamin B-12 (CYANOCOBALAMIN) 1000 MCG tablet Take 1,000 mcg by mouth daily.  Marland Kitchen warfarin (COUMADIN) 10 MG tablet TAKE 1 TABLET DAILY OR AS DIRECTED BY COUMADIN CLINIC     Allergies:    Bee venom, Codeine phosphate, Metoprolol tartrate, Pramipexole, Tramadol, and Ramipril   Social History   Socioeconomic History  . Marital status: Married    Spouse name: Not on file  . Number of children: 3  . Years of education: Not on file  . Highest education level: Not on file  Occupational History  . Occupation: retired  Tobacco Use  . Smoking status: Former Smoker    Quit date: 07/12/1973    Years since quitting: 46.7  . Smokeless tobacco: Never Used  Vaping Use  . Vaping Use: Never used  Substance and Sexual Activity  . Alcohol use: No  . Drug use: No  . Sexual activity:  Not on file  Other Topics Concern  . Not on file  Social History Narrative   The patient is married and retired   He reports 3 grown children   Former smoker, no alcohol or tobacco or drug use at this time   Social Determinants of Radio broadcast assistant Strain: Low Risk   . Difficulty of Paying Living Expenses: Not hard at all  Food Insecurity:   . Worried About Charity fundraiser in the Last Year: Not on file  . Ran Out of Food in the Last Year: Not on file  Transportation Needs: No Transportation Needs  . Lack of Transportation (Medical): No  . Lack of Transportation (Non-Medical): No  Physical Activity:   . Days of Exercise per Week: Not on file  . Minutes of Exercise per Session: Not on file  Stress:   . Feeling of Stress : Not on file  Social Connections:   . Frequency of Communication with Friends and Family: Not on file  . Frequency of Social Gatherings with Friends and Family: Not on file  . Attends Religious Services: Not on file  . Active Member of Clubs or Organizations: Not on file  . Attends Archivist Meetings: Not on file  . Marital Status: Not on file     Family History: The patient's family history includes Diabetes in his brother, brother, mother, and sister; Epilepsy in his sister; Heart attack in his father; Hypertension in his mother; Kidney cancer in  his father. There is no history of Colon cancer, Rectal cancer, Stomach cancer, or Esophageal cancer.  ROS:   Please see the history of present illness.     All other systems reviewed and are negative.  EKGs/Labs/Other Studies Reviewed:    The following studies were reviewed today: Prior echo 2017-normal functioning mechanical aortic valve  EKG:  EKG is  ordered today.  The ekg ordered today demonstrates ventricular pacing 69.  Recent Labs: 01/09/2020: ALT 20; BUN 17; Creatinine, Ser 0.83; Hemoglobin 13.9; Platelets 197.0; Potassium 4.1; Sodium 140; TSH 1.72  Recent Lipid Panel    Component Value Date/Time   CHOL 130 01/09/2020 1042   TRIG 97.0 01/09/2020 1042   TRIG 91 06/14/2006 0825   HDL 51.10 01/09/2020 1042   CHOLHDL 3 01/09/2020 1042   VLDL 19.4 01/09/2020 1042   LDLCALC 60 01/09/2020 1042    Physical Exam:    VS:  BP 140/82   Pulse 69   Ht 5\' 6"  (1.676 m)   Wt 203 lb 3.2 oz (92.2 kg)   SpO2 96%   BMI 32.80 kg/m     Wt Readings from Last 3 Encounters:  04/08/20 203 lb 3.2 oz (92.2 kg)  01/09/20 204 lb (92.5 kg)  10/16/19 208 lb (94.3 kg)     GEN:  Well nourished, well developed in no acute distress HEENT: Normal NECK: No JVD; No carotid bruits LYMPHATICS: No lymphadenopathy CARDIAC: Sharp S2 click RRR, no murmurs, rubs, gallops RESPIRATORY:  Clear to auscultation without rales, wheezing or rhonchi  ABDOMEN: Soft, non-tender, non-distended MUSCULOSKELETAL:  No edema; No deformity, wearing support stockings SKIN: Warm and dry NEUROLOGIC:  Alert and oriented x 3, parkinsonian tremor noted PSYCHIATRIC:  Normal affect   ASSESSMENT:    1. Nonrheumatic aortic valve stenosis   2. H/O mechanical aortic valve replacement   3. Essential hypertension    PLAN:    In order of problems listed above:  Aortic valve replacement -Mechanical 2005 Bentall procedure. -Check  echocardiogram.  Last 08/01/2015.  Chronic anticoagulation -Monitor closely at the Coumadin  clinic.  No bleeding.  Hemoglobin 13.9, creatinine 0.83.  Prior INR 2.2.  Close following with lab work every 6 weeks here in our clinic.  High risk medication.  Supervised. --Left arm bruising. Tendon injury.   Coronary artery disease -CABG at the time of aortic valve replacement in 2005.  Prior stress test 2013 reviewed nuclear stress test, no ischemia. -Currently on aspirin, atorvastatin 40 mg high intensity dose, losartan 100, Toprol 100, fish oil.  Hyperlipidemia -On high intensity statin no myalgias atorvastatin 40, LDL 60 from outside labs.  Carotid artery disease -Mild plaque.  Continue with aggressive secondary risk factor prevention.  Pacemaker -Dr. Lovena Whitney.  He does not have an escape rhythm.  Parkinson's disease -Seeing Dr. At [facility].  Slowly tapered medicine.  Doing better.  Did have a fall.  Her left arm.  Bruising.  Tendon rupture.  Conservative management.     Medication Adjustments/Labs and Tests Ordered: Current medicines are reviewed at length with the patient today.  Concerns regarding medicines are outlined above.  Orders Placed This Encounter  Procedures  . EKG 12-Lead  . ECHOCARDIOGRAM COMPLETE   No orders of the defined types were placed in this encounter.   Patient Instructions  Medication Instructions:  The current medical regimen is effective;  continue present plan and medications.  *If you need a refill on your cardiac medications before your next appointment, please call your pharmacy*  Testing/Procedures: Your physician has requested that you have an echocardiogram. Echocardiography is a painless test that uses sound waves to create images of your heart. It provides your doctor with information about the size and shape of your heart and how well your heart's chambers and valves are working. This procedure takes approximately one hour. There are no restrictions for this procedure.  Follow-Up: At [facility], you and your health needs are  our priority.  As part of our continuing mission to provide you with exceptional heart care, we have created designated Provider Care Teams.  These Care Teams include your primary Cardiologist (physician) and Advanced Practice Providers (APPs -  Physician Assistants and Nurse Practitioners) who all work together to provide you with the care you need, when you need it.  We recommend signing up for the patient portal called "MyChart".  Sign up information is provided on this After Visit Summary.  MyChart is used to connect with patients for Virtual Visits (Telemedicine).  Patients are able to view lab/test results, encounter notes, upcoming appointments, etc.  Non-urgent messages can be sent to your provider as well.   To learn more about what you can do with MyChart, go to [email or web address].    Your next appointment:   12 month(s)  The format for your next appointment:   In Person  Provider:   Candee Furbish, MD   Thank you for choosing [facility]!!         Signed, Candee Furbish, MD  04/08/2020 10:51 AM    [place]

## 2020-04-08 NOTE — Patient Instructions (Signed)
Medication Instructions:  The current medical regimen is effective;  continue present plan and medications.  *If you need a refill on your cardiac medications before your next appointment, please call your pharmacy*  Testing/Procedures: Your physician has requested that you have an echocardiogram. Echocardiography is a painless test that uses sound waves to create images of your heart. It provides your doctor with information about the size and shape of your heart and how well your heart's chambers and valves are working. This procedure takes approximately one hour. There are no restrictions for this procedure.  Follow-Up: At [facility], you and your health needs are our priority.  As part of our continuing mission to provide you with exceptional heart care, we have created designated Provider Care Teams.  These Care Teams include your primary Cardiologist (physician) and Advanced Practice Providers (APPs -  Physician Assistants and Nurse Practitioners) who all work together to provide you with the care you need, when you need it.  We recommend signing up for the patient portal called "MyChart".  Sign up information is provided on this After Visit Summary.  MyChart is used to connect with patients for Virtual Visits (Telemedicine).  Patients are able to view lab/test results, encounter notes, upcoming appointments, etc.  Non-urgent messages can be sent to your provider as well.   To learn more about what you can do with MyChart, go to [email or web address].    Your next appointment:   12 month(s)  The format for your next appointment:   In Person  Provider:   Mark Skains, MD   Thank you for choosing [facility]!!      

## 2020-04-29 ENCOUNTER — Ambulatory Visit (HOSPITAL_COMMUNITY): Payer: Medicare HMO | Attending: Cardiovascular Disease

## 2020-04-29 ENCOUNTER — Other Ambulatory Visit: Payer: Self-pay

## 2020-04-29 DIAGNOSIS — I10 Essential (primary) hypertension: Secondary | ICD-10-CM | POA: Diagnosis not present

## 2020-04-29 DIAGNOSIS — I350 Nonrheumatic aortic (valve) stenosis: Secondary | ICD-10-CM | POA: Insufficient documentation

## 2020-04-29 DIAGNOSIS — Z952 Presence of prosthetic heart valve: Secondary | ICD-10-CM | POA: Insufficient documentation

## 2020-04-29 LAB — ECHOCARDIOGRAM COMPLETE
AV Mean grad: 5.5 mmHg
AV Peak grad: 9.5 mmHg
Ao pk vel: 1.55 m/s
P 1/2 time: 470 msec
S' Lateral: 4.1 cm

## 2020-04-30 ENCOUNTER — Telehealth: Payer: Self-pay | Admitting: *Deleted

## 2020-04-30 DIAGNOSIS — Z9889 Other specified postprocedural states: Secondary | ICD-10-CM

## 2020-04-30 DIAGNOSIS — I77810 Thoracic aortic ectasia: Secondary | ICD-10-CM

## 2020-04-30 DIAGNOSIS — Z01812 Encounter for preprocedural laboratory examination: Secondary | ICD-10-CM

## 2020-04-30 NOTE — Telephone Encounter (Signed)
Patient notified. He would like to proceed with CTA. To be done at [facility] office.  Patient aware he will need BMP prior to CT

## 2020-04-30 NOTE — Telephone Encounter (Signed)
-----   Message from Jerline Pain, MD sent at 04/30/2020  6:43 AM EDT ----- Normal LV pump function.  Mechanical aortic valve functioning normally. Bentall procedure.  Ascending aorta measures 46 mm.  Moderately dilated.   Lets check a CTA of chest with contrast to evaluate ascending aorta post aortic root repair.  Candee Furbish, MD

## 2020-05-02 NOTE — Telephone Encounter (Signed)
Pt is scheduled for 05/12/2020

## 2020-05-08 ENCOUNTER — Other Ambulatory Visit: Payer: Self-pay

## 2020-05-08 ENCOUNTER — Other Ambulatory Visit: Payer: Medicare HMO | Admitting: *Deleted

## 2020-05-08 ENCOUNTER — Ambulatory Visit (INDEPENDENT_AMBULATORY_CARE_PROVIDER_SITE_OTHER): Payer: Medicare HMO

## 2020-05-08 DIAGNOSIS — Z01812 Encounter for preprocedural laboratory examination: Secondary | ICD-10-CM

## 2020-05-08 DIAGNOSIS — I442 Atrioventricular block, complete: Secondary | ICD-10-CM | POA: Diagnosis not present

## 2020-05-08 LAB — CUP PACEART REMOTE DEVICE CHECK
Battery Remaining Longevity: 127 mo
Battery Remaining Percentage: 95.5 %
Battery Voltage: 2.99 V
Brady Statistic AP VP Percent: 28 %
Brady Statistic AP VS Percent: 1 %
Brady Statistic AS VP Percent: 72 %
Brady Statistic AS VS Percent: 1 %
Brady Statistic RA Percent Paced: 27 %
Brady Statistic RV Percent Paced: 99 %
Date Time Interrogation Session: 20211028020013
Implantable Lead Implant Date: 20050816
Implantable Lead Implant Date: 20050816
Implantable Lead Location: 753859
Implantable Lead Location: 753860
Implantable Pulse Generator Implant Date: 20171026
Lead Channel Impedance Value: 480 Ohm
Lead Channel Impedance Value: 730 Ohm
Lead Channel Pacing Threshold Amplitude: 0.5 V
Lead Channel Pacing Threshold Amplitude: 0.875 V
Lead Channel Pacing Threshold Pulse Width: 0.5 ms
Lead Channel Pacing Threshold Pulse Width: 0.5 ms
Lead Channel Sensing Intrinsic Amplitude: 12 mV
Lead Channel Sensing Intrinsic Amplitude: 3 mV
Lead Channel Setting Pacing Amplitude: 0.75 V
Lead Channel Setting Pacing Amplitude: 1.875
Lead Channel Setting Pacing Pulse Width: 0.5 ms
Lead Channel Setting Sensing Sensitivity: 4 mV
Pulse Gen Model: 2272
Pulse Gen Serial Number: 3182792

## 2020-05-08 LAB — BASIC METABOLIC PANEL
BUN/Creatinine Ratio: 18 (ref 10–24)
BUN: 14 mg/dL (ref 8–27)
CO2: 25 mmol/L (ref 20–29)
Calcium: 9.3 mg/dL (ref 8.6–10.2)
Chloride: 104 mmol/L (ref 96–106)
Creatinine, Ser: 0.8 mg/dL (ref 0.76–1.27)
GFR calc Af Amer: 101 mL/min/{1.73_m2} (ref >59)
GFR calc non Af Amer: 87 mL/min/{1.73_m2} (ref >59)
Glucose: 114 mg/dL — ABNORMAL HIGH (ref 65–99)
Potassium: 4.2 mmol/L (ref 3.5–5.2)
Sodium: 142 mmol/L (ref 134–144)

## 2020-05-12 ENCOUNTER — Other Ambulatory Visit: Payer: Self-pay

## 2020-05-12 ENCOUNTER — Ambulatory Visit (INDEPENDENT_AMBULATORY_CARE_PROVIDER_SITE_OTHER)
Admission: RE | Admit: 2020-05-12 | Discharge: 2020-05-12 | Disposition: A | Discharge status: Home / Self Care | Payer: Medicare HMO | Source: Ambulatory Visit | Attending: Cardiology | Admitting: Cardiology

## 2020-05-12 DIAGNOSIS — Z9889 Other specified postprocedural states: Secondary | ICD-10-CM

## 2020-05-12 DIAGNOSIS — I77810 Thoracic aortic ectasia: Secondary | ICD-10-CM | POA: Diagnosis not present

## 2020-05-12 DIAGNOSIS — I712 Thoracic aortic aneurysm, without rupture: Secondary | ICD-10-CM | POA: Diagnosis not present

## 2020-05-12 MED ORDER — IOHEXOL 350 MG/ML SOLN
100.0000 mL | Freq: Once | INTRAVENOUS | Status: AC | PRN
  Administered 2020-05-12: 100 mL via INTRAVENOUS

## 2020-05-13 NOTE — Progress Notes (Signed)
Remote pacemaker transmission.   

## 2020-05-14 ENCOUNTER — Other Ambulatory Visit: Payer: Self-pay | Admitting: *Deleted

## 2020-05-14 DIAGNOSIS — I77810 Thoracic aortic ectasia: Secondary | ICD-10-CM

## 2020-05-14 DIAGNOSIS — Z952 Presence of prosthetic heart valve: Secondary | ICD-10-CM

## 2020-05-14 NOTE — Progress Notes (Signed)
Jerline Pain, MD  05/13/2020 6:49 AM EDT     Ascending aorta 45 mm. This is consistent with echocardiogram finding. Moderately dilated.  Repeat echocardiogram in 1 year. Continue with good blood pressure control. Candee Furbish, MD   Order placed for echo in 1 year (due 04/29/2021 or after)

## 2020-05-20 ENCOUNTER — Ambulatory Visit (INDEPENDENT_AMBULATORY_CARE_PROVIDER_SITE_OTHER): Payer: Medicare HMO

## 2020-05-20 ENCOUNTER — Other Ambulatory Visit: Payer: Self-pay

## 2020-05-20 DIAGNOSIS — Z09 Encounter for follow-up examination after completed treatment for conditions other than malignant neoplasm: Secondary | ICD-10-CM | POA: Diagnosis not present

## 2020-05-20 DIAGNOSIS — Z5181 Encounter for therapeutic drug level monitoring: Secondary | ICD-10-CM | POA: Diagnosis not present

## 2020-05-20 DIAGNOSIS — Z952 Presence of prosthetic heart valve: Secondary | ICD-10-CM | POA: Diagnosis not present

## 2020-05-20 LAB — POCT INR: INR: 2.7 (ref 2.0–3.0)

## 2020-05-20 NOTE — Patient Instructions (Signed)
Description   Continue taking Warfarin 1 tablet every day except 1/2 tablet on Sundays. Recheck in 6 weeks. Call with any changes. # [phone].  Fax number: [phone]

## 2020-05-22 DIAGNOSIS — G20 Parkinson's disease: Secondary | ICD-10-CM | POA: Diagnosis not present

## 2020-05-22 DIAGNOSIS — G629 Polyneuropathy, unspecified: Secondary | ICD-10-CM | POA: Diagnosis not present

## 2020-05-24 ENCOUNTER — Other Ambulatory Visit: Payer: Self-pay | Admitting: Internal Medicine

## 2020-05-24 ENCOUNTER — Other Ambulatory Visit: Payer: Self-pay | Admitting: Adult Health

## 2020-06-04 DIAGNOSIS — M25812 Other specified joint disorders, left shoulder: Secondary | ICD-10-CM | POA: Diagnosis not present

## 2020-06-08 ENCOUNTER — Other Ambulatory Visit: Payer: Self-pay | Admitting: Cardiology

## 2020-06-08 ENCOUNTER — Other Ambulatory Visit: Payer: Self-pay | Admitting: Adult Health

## 2020-06-11 ENCOUNTER — Ambulatory Visit (INDEPENDENT_AMBULATORY_CARE_PROVIDER_SITE_OTHER): Payer: Medicare HMO

## 2020-06-11 ENCOUNTER — Other Ambulatory Visit: Payer: Self-pay

## 2020-06-11 VITALS — BP 110/64 | HR 85 | Temp 98.2°F | Ht 66.0 in | Wt 203.0 lb

## 2020-06-11 DIAGNOSIS — Z0000 Encounter for general adult medical examination without abnormal findings: Secondary | ICD-10-CM | POA: Diagnosis not present

## 2020-06-11 NOTE — Progress Notes (Signed)
Subjective:   [name] is a 74 y.o. male who presents for Medicare Annual/Subsequent preventive examination.  Review of Systems    N/A  Cardiac Risk Factors include: advanced age (>52men, >73 women);male gender;diabetes mellitus     Objective:    Today's Vitals   06/11/20 1114 06/11/20 1123  BP: 110/64   Pulse: 85   Temp: 98.2 F (36.8 C)   TempSrc: Oral   SpO2: 98%   Weight: 203 lb (92.1 kg)   Height: 5\' 6"  (1.676 m)   PainSc:  3    Body mass index is 32.77 kg/m.  Advanced Directives 06/11/2020 03/15/2018 03/09/2018 05/06/2016 01/26/2016 05/02/2015 08/13/2014  Does Patient Have a Medical Advance Directive? No No No No No Yes No  Type of Advance Directive - - - - - - -  Copy of Healthcare Power of Attorney in Chart? - - - - - - -  Would patient like information on creating a medical advance directive? Yes (MAU/Ambulatory/Procedural Areas - Information given) No - Patient declined No - Patient declined Yes - Educational materials given - - No - patient declined information  Pre-existing out of facility DNR order (yellow form or pink MOST form) - - - - - - -    Current Medications (verified) Outpatient Encounter Medications as of 06/11/2020  Medication Sig  . Accu-Chek FastClix Lancets MISC USE TO CHECK BLOOD SUGAR TWICE A DAY AND AS NEEDED  . ACCU-CHEK SMARTVIEW test strip TEST BLOOD SUGAR TWICE DAILY AND AS NEEDED  . acetaminophen (TYLENOL ARTHRITIS PAIN) 650 MG CR tablet Take 1,300 mg by mouth 2 (two) times daily.  Marland Kitchen albuterol (VENTOLIN HFA) 108 (90 Base) MCG/ACT inhaler Inhale 2 puffs into the lungs every 6 (six) hours as needed for wheezing or shortness of breath.  . Ascorbic Acid (VITAMIN C) 1000 MG tablet Take 1,000 mg by mouth daily.   Marland Kitchen aspirin 81 MG tablet Take 81 mg by mouth every evening.   Marland Kitchen atorvastatin (LIPITOR) 40 MG tablet TAKE 1 TABLET EVERY DAY AT 6 PM.  . calcium carbonate (OS-CAL) 600 MG TABS Take 600 mg by mouth 2 (two) times daily with a meal.  .  carbidopa-levodopa (SINEMET IR) 25-100 MG tablet Take 1 tablet by mouth 3 (three) times daily.   . cetirizine (ZYRTEC) 10 MG tablet Take 10 mg by mouth daily.   . Cholecalciferol (VITAMIN D3) 125 MCG (5000 UT) TABS Take 1 tablet by mouth daily.  . fluticasone (FLONASE) 50 MCG/ACT nasal spray Place 2 sprays into both nostrils daily.  . furosemide (LASIX) 40 MG tablet TAKE 1 TABLET TWICE DAILY (Patient taking differently: Take 40 mg by mouth daily. )  . guaiFENesin-dextromethorphan (ROBITUSSIN DM) 100-10 MG/5ML syrup Take 5 mLs by mouth every 4 (four) hours as needed for cough.  . losartan (COZAAR) 100 MG tablet TAKE 1 TABLET EVERY DAY (NEED MD APPOINTMENT FOR REFILLS)  . melatonin 5 MG TABS Take 5 mg by mouth at bedtime.  . metFORMIN (GLUCOPHAGE) 1000 MG tablet TAKE 0.5 TABLETS (500 MG TOTAL) BY MOUTH 2 (TWO) TIMES DAILY WITH A MEAL.  . metoprolol succinate (TOPROL-XL) 100 MG 24 hr tablet TAKE 1 TABLET EVERY DAY  . Misc Natural Products (TART CHERRY ADVANCED) CAPS Take 2 capsules by mouth daily.   . Multiple Vitamin (MULTIVITAMIN) tablet Take 1 tablet by mouth daily.    . Omega-3 Fatty Acids (FISH OIL) 1200 MG CAPS Take 1,200 mg by mouth daily.   . potassium chloride (KLOR-CON) 10  MEQ tablet TAKE 1 TABLET TWICE DAILY (Patient taking differently: Take 10 mEq by mouth every morning. )  . Probiotic Product (PHILLIPS COLON HEALTH PO) Take 1 capsule by mouth daily.  . vitamin B-12 (CYANOCOBALAMIN) 1000 MCG tablet Take 1,000 mcg by mouth daily.  Marland Kitchen warfarin (COUMADIN) 10 MG tablet TAKE 1 TABLET DAILY OR AS DIRECTED BY COUMADIN CLINIC  . nitroGLYCERIN (NITROSTAT) 0.4 MG SL tablet Place 1 tablet (0.4 mg total) under the tongue every 5 (five) minutes as needed. For chest pain (Patient not taking: Reported on 06/11/2020)   No facility-administered encounter medications on file as of 06/11/2020.    Allergies (verified) Bee venom, Codeine phosphate, Metoprolol tartrate, Pramipexole, Tramadol, and Ramipril    History: Past Medical History:  Diagnosis Date  . Acute renal failure ([place]) 02/09/2013  . Aortic stenosis 08/25/2010   a. [place] aortic root replacement with a St. Jude mechanical valve and Hemashield conduit 02/2004.  Marland Kitchen Arthritis   . Asthma   . AV BLOCK, COMPLETE    a. s/p St Jude dual chamber pacemaker 02/2004.  Marland Kitchen CAD (coronary artery disease)    a. s/p CABGx2 (LIMA-dLAD, SVG-Cx). b. Low risk nuc 12/2012 without ischemia, EF 46% mild apical hypokinesia (EF 55% inf HK by echo).  . Carotid artery disease ([place])    a. 0-39% bilateral ICA stenosis, stable mild hard plaque in carotid bulbs. F/u 03/2014 recommended.  . Diabetes mellitus without complication ([place])    type 2   . DIVERTICULOSIS, COLON 10/17/2007  . Ejection fraction    a. EF 55% with inf HK, mild MR by echo 12/2012.  Marland Kitchen GERD (gastroesophageal reflux disease)    hxof   . GOUT 01/16/2007  . Headache    hx of migraines   . HEMORRHOIDS, INTERNAL 11/01/2008  . Hx of adenomatous polyp of colon 02/2019   no recall needed  . HYPERLIPIDEMIA 01/16/2007  . HYPERTENSION 01/16/2007  . Impaired glucose tolerance   . LEG EDEMA 11/27/2008  . Special screening for malignant neoplasm of prostate   . Warfarin anticoagulation    AVR   Past Surgical History:  Procedure Laterality Date  . AORTIC VALVE REPLACEMENT    . CARDIAC CATHETERIZATION  06/2003  . CIRCUMCISION    . COLONOSCOPY    . CORONARY ARTERY BYPASS GRAFT    . coronary stents      prior to bypass   . EP IMPLANTABLE DEVICE N/A 05/06/2016   Procedure: PPM Generator Changeout;  Surgeon: Evans Lance, MD;  Location: [facility];  Service: Cardiovascular;  Laterality: N/A;  . ESOPHAGOGASTRODUODENOSCOPY    . HERNIA REPAIR    . INGUINAL HERNIA REPAIR Bilateral 02/07/2013   Procedure: LAPAROSCOPIC BILATERAL INGUINAL HERNIA REPAIR;  Surgeon: Imogene Burn. Georgette Dover, MD;  Location: [facility];  Service: General;  Laterality: Bilateral;  . INSERTION OF MESH Bilateral 02/07/2013   Procedure:  INSERTION OF MESH;  Surgeon: Imogene Burn. Georgette Dover, MD;  Location: [facility];  Service: General;  Laterality: Bilateral;  . LUMBAR LAMINECTOMY/DECOMPRESSION MICRODISCECTOMY N/A 03/15/2018   Procedure: Complete decompressive lumbar laminectomy for spinal stenosis of L4-L5 and foraminotomy for L4-L5 root on right;  Surgeon: [name], MD;  Location: [facility];  Service: Orthopedics;  Laterality: N/A;  . PACEMAKER INSERTION    . SHOULDER SURGERY Bilateral   . SKIN BIOPSY  05/04/2019   BASAL CELL CARCINOMA//MID CENTRAL NASAL  . SKIN BIOPSY  10/11/2019   Superficial basal cell carcinoma - left superior central forehead shave   Family  History  Problem Relation Age of Onset  . Heart attack Father   . Kidney cancer Father   . Hypertension Mother   . Diabetes Mother   . Diabetes Brother   . Epilepsy Sister   . Diabetes Sister   . Diabetes Brother   . Colon cancer Neg Hx   . Rectal cancer Neg Hx   . Stomach cancer Neg Hx   . Esophageal cancer Neg Hx    Social History   Socioeconomic History  . Marital status: Married    Spouse name: Not on file  . Number of children: 3  . Years of education: Not on file  . Highest education level: Not on file  Occupational History  . Occupation: retired  Tobacco Use  . Smoking status: Former Smoker    Quit date: 07/12/1973    Years since quitting: 46.9  . Smokeless tobacco: Never Used  Vaping Use  . Vaping Use: Never used  Substance and Sexual Activity  . Alcohol use: No  . Drug use: No  . Sexual activity: Not on file  Other Topics Concern  . Not on file  Social History Narrative   The patient is married and retired   He reports 3 grown children   Former smoker, no alcohol or tobacco or drug use at this time   Social Determinants of Radio broadcast assistant Strain: Low Risk   . Difficulty of Paying Living Expenses: Not hard at all  Food Insecurity: No Food Insecurity  . Worried About Charity fundraiser in the Last Year: Never true  . Ran  Out of Food in the Last Year: Never true  Transportation Needs: No Transportation Needs  . Lack of Transportation (Medical): No  . Lack of Transportation (Non-Medical): No  Physical Activity: Sufficiently Active  . Days of Exercise per Week: 7 days  . Minutes of Exercise per Session: 30 min  Stress: No Stress Concern Present  . Feeling of Stress : Not at all  Social Connections: Moderately Integrated  . Frequency of Communication with Friends and Family: More than three times a week  . Frequency of Social Gatherings with Friends and Family: More than three times a week  . Attends Religious Services: More than 4 times per year  . Active Member of Clubs or Organizations: No  . Attends Archivist Meetings: Never  . Marital Status: Married    Tobacco Counseling Counseling given: Not Answered   Clinical Intake:  Pre-visit preparation completed: Yes  Pain : 0-10 Pain Score: 3  Pain Type: Chronic pain Pain Location: Shoulder Pain Orientation: Left Pain Descriptors / Indicators: Aching Pain Onset: More than a month ago Pain Frequency: Intermittent Pain Relieving Factors: Heating pad  Pain Relieving Factors: Heating pad  Nutritional Risks: None Diabetes: Yes CBG done?: No Did pt. bring in CBG monitor from home?: No  How often do you need to have someone help you when you read instructions, pamphlets, or other written materials from your doctor or pharmacy?: 1 - Never What is the last grade level you completed in school?: High School  Diabetic?yes Nutrition Risk Assessment:  Has the patient had any N/V/D within the last 2 months?  No  Does the patient have any non-healing wounds?  No  Has the patient had any unintentional weight loss or weight gain?  No   Diabetes:  Is the patient diabetic?  Yes  If diabetic, was a CBG obtained today?  No  Did the patient  bring in their glucometer from home?  No  How often do you monitor your CBG's? Patient states checks  glucose once a twice .   Financial Strains and Diabetes Management:  Are you having any financial strains with the device, your supplies or your medication? No .  Does the patient want to be seen by Chronic Care Management for management of their diabetes?  No  Would the patient like to be referred to a Nutritionist or for Diabetic Management?  No   Diabetic Exams:  Diabetic Eye Exam: Overdue for diabetic eye exam. Pt has been advised about the importance in completing this exam. Patient advised to call and schedule an eye exam. Diabetic Foot Exam: Completed 01/09/2020   Interpreter Needed?: No  Information entered by :: [place] of Daily Living In your present state of health, do you have any difficulty performing the following activities: 06/11/2020  Hearing? Y  Comment Patient has difficulty with hearing in bilateral ears  Vision? N  Difficulty concentrating or making decisions? N  Walking or climbing stairs? N  Dressing or bathing? N  Doing errands, shopping? Y  Preparing Food and eating ? N  Using the Toilet? N  In the past six months, have you accidently leaked urine? N  Do you have problems with loss of bowel control? N  Managing your Medications? N  Managing your Finances? N  Housekeeping or managing your Housekeeping? N  Some recent data might be hidden    Patient Care Team: [name], NP as PCP - General (Family Medicine) Jerline Pain, MD as PCP - Cardiology (Cardiology) [name], MD as Consulting Physician (Orthopedic Surgery) [name], MD as Consulting Physician (Dermatology) Earnie Larsson, [facility] as Pharmacist (Pharmacist)  Indicate any recent Medical Services you may have received from other than [facility] providers in the past year (date may be approximate).     Assessment:   This is a routine wellness examination for [name].  Hearing/Vision screen  Hearing Screening   125Hz  250Hz  500Hz  1000Hz  2000Hz  3000Hz  4000Hz   6000Hz  8000Hz   Right ear:           Left ear:           Vision Screening Comments: Patient states gets eyes examined once per year   Dietary issues and exercise activities discussed: Current Exercise Habits: Home exercise routine, Type of exercise: walking, Time (Minutes): 30, Frequency (Times/Week): 7, Weekly Exercise (Minutes/Week): 210, Intensity: Mild, Exercise limited by: neurologic condition(s)  Goals    . Exercise 3x per week (30 min per time)    . Pharmacy Care Plan     CARE PLAN ENTRY (see longitudinal plan of care for additional care plan information)  Current Barriers:  . Chronic Disease Management support, education, and care coordination needs related to Hypertension, Hyperlipidemia, Diabetes, Coronary Artery Disease, and Aortic valve replacement    Hypertension BP Readings from Last 3 Encounters:  01/09/20 120/84  10/16/19 118/70  10/01/19 (!) 147/85   . Pharmacist Clinical Goal(s): o Over the next 120 days, patient will work with PharmD and providers to maintain BP goal <130/80 . Current regimen:   Losartan 100 mg daily   Metoprolol succinate 100 mg daily . Interventions: o Discussed fall risk precautions.  . Patient self care activities - Over the next 120 days, patient will: o Check BP several times per month, document, and provide at future appointments o Ensure daily salt intake < 2300 mg/day  Hyperlipidemia Lab Results  Component Value Date/Time  [place] 60 01/09/2020 10:42 AM   . Pharmacist Clinical Goal(s): o Over the next 120 days, patient will work with PharmD and providers to maintain LDL goal < 70 . Current regimen:  . Atorvastatin 40 mg daily at 6PM  . Fish oil 1,200 mg daily . Patient self care activities - Over the next 120 days, patient will: o Continue current medications as directed by providers.   Diabetes Lab Results  Component Value Date/Time   HGBA1C 6.2 01/09/2020 10:42 AM   HGBA1C 5.8 10/16/2019 01:10 PM   HGBA1C 6.3  04/09/2019 10:06 AM   HGBA1C 6.0 09/28/2018 08:22 AM   . Pharmacist Clinical Goal(s): o Over the next 120 days, patient will work with PharmD and providers to maintain A1c goal <7% . Current regimen:  o Metformin 500 mg twice daily . Interventions: o Discussed maintain a low carbohydrate diet, eating 45 grams of carbohydrates per meal (3 servings of carbohydrates per meal), 15 grams of carbohydrates per snack (1 serving of carbohydrate per snack).  . Patient self care activities - Over the next 120 days, patient will: o Check blood sugar twice weekly, document, and provide at future appointments o Contact provider with any episodes of hypoglycemia  Coronary artery disease . Pharmacist Clinical Goal(s) o Over the next 120 days, patient will work with PharmD and providers to prevent/ decrease risk of cardiac events.  . Current regimen:   Nitroglycerin 0.4 mg as needed for chest pain  Atorvastatin 40 mg daily  Aspirin 81 mg daily . Interventions: o Recommend verifying expiration date of nitroglycerin and replacing as needed.  . Patient self care activities - Over the next 120 days, patient will: o Continue current medications as directed by provider.   Aortic valve replacement . Pharmacist Clinical Goal(s) o Over the next 120 days, patient will work with PharmD and providers to maintain INR: 2.0 to 3.0  . Current regimen:  o Warfarin 10mg , as directed by Coumadin clinic.  . Interventions: o We discussed:   o - monitoring for signs and symptoms for bleeding (coughing up blood, prolonged nose bleeds, black, tarry stools). . Patient self care activities o Patient will continue current medications as directed by providers.   Medication management . Pharmacist Clinical Goal(s): o Over the next 120 days, patient will work with PharmD and providers to maintain optimal medication adherence . Current pharmacy: [facility] . Interventions o Comprehensive medication  review performed. o Continue current medication management strategy . Patient self care activities - Over the next 120 days, patient will: o Take medications as prescribed o Report any questions or concerns to PharmD and/or provider(s)  Initial goal documentation       Depression Screen PHQ 2/9 Scores 06/11/2020 01/09/2020 10/11/2016 06/07/2016 05/02/2015 04/28/2015 02/19/2014  PHQ - 2 Score 0 0 0 0 0 0 0  PHQ- 9 Score 0 - - - - - -    Fall Risk Fall Risk  06/11/2020 01/09/2020 10/11/2016 06/07/2016 05/02/2015  Falls in the past year? 1 1 No No No  Number falls in past yr: 0 1 - - -  Injury with Fall? 1 1 - - -  Risk for fall due to : History of fall(s) - - - -  Follow up Falls evaluation completed;Falls prevention discussed - - - -    Any stairs in or around the home? Yes  If so, are there any without handrails? No  Home free of loose throw rugs in walkways, pet beds, electrical  cords, etc? Yes  Adequate lighting in your home to reduce risk of falls? Yes   ASSISTIVE DEVICES UTILIZED TO PREVENT FALLS:  Life alert? No  Use of a cane, walker or w/c? No  Grab bars in the bathroom? Yes  Shower chair or bench in shower? Yes  Elevated toilet seat or a handicapped toilet? No   TIMED UP AND GO:  Was the test performed? Yes .  Length of time to ambulate 10 feet: 8 sec.   Gait slow and steady without use of assistive device  Cognitive Function:  Cognitive screening not indicated based on direct observation.      Immunizations Immunization History  Administered Date(s) Administered  . Fluad Quad(high Dose 65+) 03/22/2019, 04/01/2020  . Influenza Split 03/31/2011, 04/10/2012  . Influenza Whole 04/04/2008, 05/09/2009, 03/18/2010  . Influenza, High Dose Seasonal PF 04/28/2015, 04/06/2016, 04/12/2017, 03/30/2018  . Influenza,inj,Quad PF,6+ Mos 04/02/2013  . Influenza-Unspecified 04/05/2014  . Pneumococcal Conjugate-13 02/13/2014  . Pneumococcal Polysaccharide-23 10/07/2011  . Tdap  10/07/2011, 07/16/2012  . Zoster 10/07/2011  . Zoster Recombinat (Shingrix) 10/11/2017, 12/12/2017    TDAP status: Up to date Flu Vaccine status: Up to date Pneumococcal vaccine status: Up to date Covid-19 vaccine status: Declined, Education has been provided regarding the importance of this vaccine but patient still declined. Advised may receive this vaccine at local pharmacy or Health Dept.or vaccine clinic. Aware to provide a copy of the vaccination record if obtained from local pharmacy or Health Dept. Verbalized acceptance and understanding.  Qualifies for Shingles Vaccine? Yes   Zostavax completed Yes   Shingrix Completed?: Yes  Screening Tests Health Maintenance  Topic Date Due  . COVID-19 Vaccine (1) Never done  . OPHTHALMOLOGY EXAM  06/25/2017  . HEMOGLOBIN A1C  07/10/2020  . FOOT EXAM  01/08/2021  . TETANUS/TDAP  07/16/2022  . INFLUENZA VACCINE  Completed  . Hepatitis C Screening  Completed  . PNA vac Low Risk Adult  Completed    Health Maintenance  Health Maintenance Due  Topic Date Due  . COVID-19 Vaccine (1) Never done  . OPHTHALMOLOGY EXAM  06/25/2017    Colorectal cancer screening: No longer required.   Lung Cancer Screening: (Low Dose CT Chest recommended if Age 41-80 years, 30 pack-year currently smoking OR have quit w/in 15years.) does not qualify.   Lung Cancer Screening Referral: N/A   Additional Screening:  Hepatitis C Screening: does qualify; Completed 10/11/2016  Vision Screening: Recommended annual ophthalmology exams for early detection of glaucoma and other disorders of the eye. Is the patient up to date with their annual eye exam?  Yes  Who is the provider or what is the name of the office in which the patient attends annual eye exams? Dr. Jorja Loa If pt is not established with a provider, would they like to be referred to a provider to establish care? No .   Dental Screening: Recommended annual dental exams for proper oral  hygiene  Community Resource Referral / Chronic Care Management: CRR required this visit?  No   CCM required this visit?  No      Plan:     I have personally reviewed and noted the following in the patient's chart:   . Medical and social history . Use of alcohol, tobacco or illicit drugs  . Current medications and supplements . Functional ability and status . Nutritional status . Physical activity . Advanced directives . List of other physicians . Hospitalizations, surgeries, and ER visits in previous 12 months .  Vitals . Screenings to include cognitive, depression, and falls . Referrals and appointments  In addition, I have reviewed and discussed with patient certain preventive protocols, quality metrics, and best practice recommendations. A written personalized care plan for preventive services as well as general preventive health recommendations were provided to patient.     Ofilia Neas, LPN   44/03/2009   Nurse Notes: None

## 2020-06-11 NOTE — Patient Instructions (Signed)
[name] , Thank you for taking time to come for your Medicare Wellness Visit. I appreciate your ongoing commitment to your health goals. Please review the following plan we discussed and let me know if I can assist you in the future.   Screening recommendations/referrals: Colonoscopy: No longer required  Recommended yearly ophthalmology/optometry visit for glaucoma screening and checkup Recommended yearly dental visit for hygiene and checkup  Vaccinations: Influenza vaccine: Up to date,next due fall 2022  Pneumococcal vaccine: Completed series  Tdap vaccine: Up to date, next due 07/16/2022 Shingles vaccine: Completed series     Advanced directives: Advance directive discussed with you today. Even though you declined this today please call our office should you change your mind and we can give you the proper paperwork for you to fill out.   Conditions/risks identified: None   Next appointment: 06/20/2020 @ 1:00 PM with Pharmacist via Telephone   Preventive Care 65 Years and Older, Male Preventive care refers to lifestyle choices and visits with your health care provider that can promote health and wellness. What does preventive care include?  A yearly physical exam. This is also called an annual well check.  Dental exams once or twice a year.  Routine eye exams. Ask your health care provider how often you should have your eyes checked.  Personal lifestyle choices, including:  Daily care of your teeth and gums.  Regular physical activity.  Eating a healthy diet.  Avoiding tobacco and drug use.  Limiting alcohol use.  Practicing safe sex.  Taking low doses of aspirin every day.  Taking vitamin and mineral supplements as recommended by your health care provider. What happens during an annual well check? The services and screenings done by your health care provider during your annual well check will depend on your age, overall health, lifestyle risk factors, and family  history of disease. Counseling  Your health care provider may ask you questions about your:  Alcohol use.  Tobacco use.  Drug use.  Emotional well-being.  Home and relationship well-being.  Sexual activity.  Eating habits.  History of falls.  Memory and ability to understand (cognition).  Work and work Statistician. Screening  You may have the following tests or measurements:  Height, weight, and BMI.  Blood pressure.  Lipid and cholesterol levels. These may be checked every 5 years, or more frequently if you are over 70 years old.  Skin check.  Lung cancer screening. You may have this screening every year starting at age 77 if you have a 30-pack-year history of smoking and currently smoke or have quit within the past 15 years.  Fecal occult blood test (FOBT) of the stool. You may have this test every year starting at age 57.  Flexible sigmoidoscopy or colonoscopy. You may have a sigmoidoscopy every 5 years or a colonoscopy every 10 years starting at age 66.  Prostate cancer screening. Recommendations will vary depending on your family history and other risks.  Hepatitis C blood test.  Hepatitis B blood test.  Sexually transmitted disease (STD) testing.  Diabetes screening. This is done by checking your blood sugar (glucose) after you have not eaten for a while (fasting). You may have this done every 1-3 years.  Abdominal aortic aneurysm (AAA) screening. You may need this if you are a current or former smoker.  Osteoporosis. You may be screened starting at age 73 if you are at high risk. Talk with your health care provider about your test results, treatment options, and if necessary,  the need for more tests. Vaccines  Your health care provider may recommend certain vaccines, such as:  Influenza vaccine. This is recommended every year.  Tetanus, diphtheria, and acellular pertussis (Tdap, Td) vaccine. You may need a Td booster every 10 years.  Zoster vaccine.  You may need this after age 84.  Pneumococcal 13-valent conjugate (PCV13) vaccine. One dose is recommended after age 69.  Pneumococcal polysaccharide (PPSV23) vaccine. One dose is recommended after age 97. Talk to your health care provider about which screenings and vaccines you need and how often you need them. This information is not intended to replace advice given to you by your health care provider. Make sure you discuss any questions you have with your health care provider. Document Released: 07/25/2015 Document Revised: 03/17/2016 Document Reviewed: 04/29/2015 Elsevier Interactive Patient Education  2017 [place] Prevention in the Home Falls can cause injuries. They can happen to people of all ages. There are many things you can do to make your home safe and to help prevent falls. What can I do on the outside of my home?  Regularly fix the edges of walkways and driveways and fix any cracks.  Remove anything that might make you trip as you walk through a door, such as a raised step or threshold.  Trim any bushes or trees on the path to your home.  Use bright outdoor lighting.  Clear any walking paths of anything that might make someone trip, such as rocks or tools.  Regularly check to see if handrails are loose or broken. Make sure that both sides of any steps have handrails.  Any raised decks and porches should have guardrails on the edges.  Have any leaves, snow, or ice cleared regularly.  Use sand or salt on walking paths during winter.  Clean up any spills in your garage right away. This includes oil or grease spills. What can I do in the bathroom?  Use night lights.  Install grab bars by the toilet and in the tub and shower. Do not use towel bars as grab bars.  Use non-skid mats or decals in the tub or shower.  If you need to sit down in the shower, use a plastic, non-slip stool.  Keep the floor dry. Clean up any water that spills on the floor as soon  as it happens.  Remove soap buildup in the tub or shower regularly.  Attach bath mats securely with double-sided non-slip rug tape.  Do not have throw rugs and other things on the floor that can make you trip. What can I do in the bedroom?  Use night lights.  Make sure that you have a light by your bed that is easy to reach.  Do not use any sheets or blankets that are too big for your bed. They should not hang down onto the floor.  Have a firm chair that has side arms. You can use this for support while you get dressed.  Do not have throw rugs and other things on the floor that can make you trip. What can I do in the kitchen?  Clean up any spills right away.  Avoid walking on wet floors.  Keep items that you use a lot in easy-to-reach places.  If you need to reach something above you, use a strong step stool that has a grab bar.  Keep electrical cords out of the way.  Do not use floor polish or wax that makes floors slippery. If you must use  wax, use non-skid floor wax.  Do not have throw rugs and other things on the floor that can make you trip. What can I do with my stairs?  Do not leave any items on the stairs.  Make sure that there are handrails on both sides of the stairs and use them. Fix handrails that are broken or loose. Make sure that handrails are as long as the stairways.  Check any carpeting to make sure that it is firmly attached to the stairs. Fix any carpet that is loose or worn.  Avoid having throw rugs at the top or bottom of the stairs. If you do have throw rugs, attach them to the floor with carpet tape.  Make sure that you have a light switch at the top of the stairs and the bottom of the stairs. If you do not have them, ask someone to add them for you. What else can I do to help prevent falls?  Wear shoes that:  Do not have high heels.  Have rubber bottoms.  Are comfortable and fit you well.  Are closed at the toe. Do not wear sandals.  If  you use a stepladder:  Make sure that it is fully opened. Do not climb a closed stepladder.  Make sure that both sides of the stepladder are locked into place.  Ask someone to hold it for you, if possible.  Clearly mark and make sure that you can see:  Any grab bars or handrails.  First and last steps.  Where the edge of each step is.  Use tools that help you move around (mobility aids) if they are needed. These include:  Canes.  Walkers.  Scooters.  Crutches.  Turn on the lights when you go into a dark area. Replace any light bulbs as soon as they burn out.  Set up your furniture so you have a clear path. Avoid moving your furniture around.  If any of your floors are uneven, fix them.  If there are any pets around you, be aware of where they are.  Review your medicines with your doctor. Some medicines can make you feel dizzy. This can increase your chance of falling. Ask your doctor what other things that you can do to help prevent falls. This information is not intended to replace advice given to you by your health care provider. Make sure you discuss any questions you have with your health care provider. Document Released: 04/24/2009 Document Revised: 12/04/2015 Document Reviewed: 08/02/2014 Elsevier Interactive Patient Education  2017 [employer].

## 2020-06-12 ENCOUNTER — Encounter: Payer: Self-pay | Admitting: Adult Health

## 2020-06-20 ENCOUNTER — Telehealth: Payer: Self-pay | Admitting: *Deleted

## 2020-06-20 ENCOUNTER — Ambulatory Visit: Payer: Medicare HMO | Admitting: Pharmacist

## 2020-06-20 DIAGNOSIS — E119 Type 2 diabetes mellitus without complications: Secondary | ICD-10-CM

## 2020-06-20 DIAGNOSIS — E785 Hyperlipidemia, unspecified: Secondary | ICD-10-CM

## 2020-06-20 NOTE — Patient Instructions (Signed)
Hi [name] and [name],  It was lovely to get to meet the two of you over the phone today! I want to encourage you to continue to keep up the good work with taking care of yourself through the foods you eat, checking your blood pressure and blood sugars, and taking your medications as prescribed.  Please give me a call or send me a message if you need anything before our follow up in 4 months!  Best, Maddie  [name], PharmD [facility] Clinical Pharmacist [place] [facility] [place]   Visit Information  Goals Addressed            This Visit's Progress   . Pharmacy Care Plan       CARE PLAN ENTRY (see longitudinal plan of care for additional care plan information)  Current Barriers:  . Chronic Disease Management support, education, and care coordination needs related to Hypertension, Hyperlipidemia, Diabetes, Coronary Artery Disease, and Aortic valve replacement    Hypertension BP Readings from Last 3 Encounters:  06/11/20 110/64  04/08/20 140/82  01/09/20 120/84   . Pharmacist Clinical Goal(s): o Over the next 120 days, patient will work with PharmD and providers to maintain BP goal <130/80 . Current regimen:   Losartan 100 mg daily   Metoprolol succinate 100 mg daily . Interventions: o Discussed fall risk precautions.  . Patient self care activities - Over the next 120 days, patient will: o Check BP several times per month, document, and provide at future appointments o Ensure daily salt intake < 2300 mg/day  Hyperlipidemia Lab Results  Component Value Date/Time   LDLCALC 60 01/09/2020 10:42 AM   . Pharmacist Clinical Goal(s): o Over the next 120 days, patient will work with PharmD and providers to maintain LDL goal < 70 . Current regimen:  . Atorvastatin 40 mg daily at 6PM  . Fish oil 1,200 mg daily . Patient self care activities - Over the next 120 days, patient will: o Continue current medications as directed by providers.    Diabetes Lab Results  Component Value Date/Time   HGBA1C 6.2 01/09/2020 10:42 AM   HGBA1C 5.8 10/16/2019 01:10 PM   HGBA1C 6.3 04/09/2019 10:06 AM   HGBA1C 6.0 09/28/2018 08:22 AM   . Pharmacist Clinical Goal(s): o Over the next 120 days, patient will work with PharmD and providers to maintain A1c goal <7% . Current regimen:  o Metformin 500 mg twice daily . Interventions: o Discussed maintain a low carbohydrate diet, eating 45 grams of carbohydrates per meal (3 servings of carbohydrates per meal), 15 grams of carbohydrates per snack (1 serving of carbohydrate per snack).  . Patient self care activities - Over the next 120 days, patient will: o Check blood sugar twice weekly, document, and provide at future appointments o Contact provider with any episodes of hypoglycemia  Coronary artery disease . Pharmacist Clinical Goal(s) o Over the next 120 days, patient will work with PharmD and providers to prevent/ decrease risk of cardiac events.  . Current regimen:   Nitroglycerin 0.4 mg as needed for chest pain  Atorvastatin 40 mg daily  Aspirin 81 mg daily . Interventions: o Recommend verifying expiration date of nitroglycerin and replacing as needed.  . Patient self care activities - Over the next 120 days, patient will: o Continue current medications as directed by provider.   Aortic valve replacement . Pharmacist Clinical Goal(s) o Over the next 120 days, patient will work with PharmD and providers to maintain INR: 2.0 to 3.0  .  Current regimen:  o Warfarin 10mg , as directed by Coumadin clinic.  . Interventions: o We discussed monitoring for signs and symptoms for bleeding (coughing up blood, prolonged nose bleeds, black, tarry stools). . Patient self care activities o Patient will continue current medications as directed by providers.   Medication management . Pharmacist Clinical Goal(s): o Over the next 120 days, patient will work with PharmD and providers to maintain  optimal medication adherence . Current pharmacy: [facility] . Interventions o Comprehensive medication review performed. o Continue current medication management strategy . Patient self care activities - Over the next 120 days, patient will: o Take medications as prescribed o Report any questions or concerns to PharmD and/or provider(s)  Please see past updates related to this goal by clicking on the "Past Updates" button in the selected goal         The patient verbalized understanding of instructions, educational materials, and care plan provided today and declined offer to receive copy of patient instructions, educational materials, and care plan.   Telephone follow up appointment with pharmacy team member scheduled for: 4 months  Viona Gilmore, [facility]

## 2020-06-20 NOTE — Chronic Care Management (AMB) (Signed)
Chronic Care Management Pharmacy  Name: [name]  MRN: [id number] DOB: 1946/02/19  Initial Questions: 1. Have you seen any other providers since your last visit? Yes  2. Any changes in your medicines or health? n/a  Chief Complaint/ HPI  [name],  74 y.o. , male presents for their Follow-Up CCM visit with the clinical pharmacist via telephone due to COVID-19 Pandemic.  PCP : [name], NP  Their chronic conditions include: DM, HTN, HLD, CAD, Aortic valve replacement, heart failure, Parkinson's disease, Asthma,  Allergic rhinitis, Gout, Osteoarthritis   Office Visits: -06/11/20 Ofilia Neas, LPN: Patient presented for medicare annual wellness exam.   -01/09/20 [name], NP: Patient presented for annual exam.  No medication changes. Completed labs: A1C 6.5,  LDL 60, HDL 51.1, TGs 97  -10/16/19 [name], NP: Patient presented for diabetes follow up. A1C 5.8. Decreased metformin from 1000 mg to 500 mg BID.  Consult Visit: -05/20/20 Ovidio Kin, RN (Cardiology): Patient presented for anti-coag visit. INR 2.7, goal 2-3. Warfarin dose of 5 mg on Sun and 10 mg on all other days.  -02/08/20 Lacie Draft [facility]): pain of left shoulder joint diagnosis  -01/24/20 [name] [facility]): bilateral arthritis of knees diagnosis  -01/23/20 Melody Combs, RN (Neurology): Pt reduced dose of Sinemet to 25/100 mg to 1 tab TID and is feeling much better without any nausea and symptom control.   -01/16/20 Candance Hemphill RN (cardiology): INR 2.2 (goal 2-3), 5 mg every Sun and 10 mg all other days. Recheck in 6 weeks.  -11/28/19 Dr. Lula Olszewski (Neurology): Patient presented for evaluation of Parkinson's disease. Patient has some dysphagia for large pills, tremor in left hand and chin, and is slow walking and has tripped. Mirapex and Requip previously stopped due to hallucinations and feet swelling. Retrial of Sinemet 25/100 mg up to 2 tablets TID with previous  stomach upset.  -11/08/19 Cristopher Peru (Cardiology): atrioventricular block diagnosis  -11/06/19 Griselda Miner (Dermatology): history of malignant neoplasm of skin, basal cell carcinoma of other parts of face diagnosis  -10/01/19 Dr. Star Age (Neurology): Patient presented for Parkinson's disease follow up. Pt reported a fall and still shaking. Dose of Requip was previously decreased to 0.5 mg TID due to swelling and fatigue and recommended switch to Rotigotine 2 mg/24HR patch daily.   Medications: Outpatient Encounter Medications as of 06/20/2020  Medication Sig  . Accu-Chek FastClix Lancets MISC USE TO CHECK BLOOD SUGAR TWICE A DAY AND AS NEEDED  . ACCU-CHEK SMARTVIEW test strip TEST BLOOD SUGAR TWICE DAILY AND AS NEEDED  . acetaminophen (TYLENOL ARTHRITIS PAIN) 650 MG CR tablet Take 1,300 mg by mouth 2 (two) times daily.  Marland Kitchen albuterol (VENTOLIN HFA) 108 (90 Base) MCG/ACT inhaler Inhale 2 puffs into the lungs every 6 (six) hours as needed for wheezing or shortness of breath.  . Ascorbic Acid (VITAMIN C) 1000 MG tablet Take 1,000 mg by mouth daily.   Marland Kitchen aspirin 81 MG tablet Take 81 mg by mouth every evening.   Marland Kitchen atorvastatin (LIPITOR) 40 MG tablet TAKE 1 TABLET EVERY DAY AT 6 PM.  . calcium carbonate (OS-CAL) 600 MG TABS Take 600 mg by mouth 2 (two) times daily with a meal.  . carbidopa-levodopa (SINEMET IR) 25-100 MG tablet Take 1 tablet by mouth 3 (three) times daily.   . cetirizine (ZYRTEC) 10 MG tablet Take 10 mg by mouth daily.   . Cholecalciferol (VITAMIN D3) 125 MCG (5000 UT) TABS Take 1 tablet by mouth daily.  Marland Kitchen  fluticasone (FLONASE) 50 MCG/ACT nasal spray Place 2 sprays into both nostrils daily.  . furosemide (LASIX) 40 MG tablet TAKE 1 TABLET TWICE DAILY (Patient taking differently: Take 40 mg by mouth daily. )  . guaiFENesin-dextromethorphan (ROBITUSSIN DM) 100-10 MG/5ML syrup Take 5 mLs by mouth every 4 (four) hours as needed for cough.  . losartan (COZAAR) 100 MG tablet TAKE 1  TABLET EVERY DAY (NEED MD APPOINTMENT FOR REFILLS)  . melatonin 5 MG TABS Take 5 mg by mouth at bedtime.  . metFORMIN (GLUCOPHAGE) 1000 MG tablet TAKE 0.5 TABLETS (500 MG TOTAL) BY MOUTH 2 (TWO) TIMES DAILY WITH A MEAL.  . metoprolol succinate (TOPROL-XL) 100 MG 24 hr tablet TAKE 1 TABLET EVERY DAY  . Misc Natural Products (TART CHERRY ADVANCED) CAPS Take 2 capsules by mouth daily.   . Multiple Vitamin (MULTIVITAMIN) tablet Take 1 tablet by mouth daily.    . nitroGLYCERIN (NITROSTAT) 0.4 MG SL tablet Place 1 tablet (0.4 mg total) under the tongue every 5 (five) minutes as needed. For chest pain (Patient not taking: Reported on 06/11/2020)  . Omega-3 Fatty Acids (FISH OIL) 1200 MG CAPS Take 1,200 mg by mouth daily.   . potassium chloride (KLOR-CON) 10 MEQ tablet TAKE 1 TABLET TWICE DAILY (Patient taking differently: Take 10 mEq by mouth every morning. )  . Probiotic Product (PHILLIPS COLON HEALTH PO) Take 1 capsule by mouth daily.  . vitamin B-12 (CYANOCOBALAMIN) 1000 MCG tablet Take 1,000 mcg by mouth daily.  Marland Kitchen warfarin (COUMADIN) 10 MG tablet TAKE 1 TABLET DAILY OR AS DIRECTED BY COUMADIN CLINIC   No facility-administered encounter medications on file as of 06/20/2020.     Current Diagnosis/Assessment:  Goals Addressed            This Visit's Progress   . Pharmacy Care Plan       CARE PLAN ENTRY (see longitudinal plan of care for additional care plan information)  Current Barriers:  . Chronic Disease Management support, education, and care coordination needs related to Hypertension, Hyperlipidemia, Diabetes, Coronary Artery Disease, and Aortic valve replacement    Hypertension BP Readings from Last 3 Encounters:  06/11/20 110/64  04/08/20 140/82  01/09/20 120/84   . Pharmacist Clinical Goal(s): o Over the next 120 days, patient will work with PharmD and providers to maintain BP goal <130/80 . Current regimen:   Losartan 100 mg daily   Metoprolol succinate 100 mg  daily . Interventions: o Discussed fall risk precautions.  . Patient self care activities - Over the next 120 days, patient will: o Check BP several times per month, document, and provide at future appointments o Ensure daily salt intake < 2300 mg/day  Hyperlipidemia Lab Results  Component Value Date/Time   LDLCALC 60 01/09/2020 10:42 AM   . Pharmacist Clinical Goal(s): o Over the next 120 days, patient will work with PharmD and providers to maintain LDL goal < 70 . Current regimen:  . Atorvastatin 40 mg daily at 6PM  . Fish oil 1,200 mg daily . Patient self care activities - Over the next 120 days, patient will: o Continue current medications as directed by providers.   Diabetes Lab Results  Component Value Date/Time   HGBA1C 6.2 01/09/2020 10:42 AM   HGBA1C 5.8 10/16/2019 01:10 PM   HGBA1C 6.3 04/09/2019 10:06 AM   HGBA1C 6.0 09/28/2018 08:22 AM   . Pharmacist Clinical Goal(s): o Over the next 120 days, patient will work with PharmD and providers to maintain A1c goal <7% .  Current regimen:  o Metformin 500 mg twice daily . Interventions: o Discussed maintain a low carbohydrate diet, eating 45 grams of carbohydrates per meal (3 servings of carbohydrates per meal), 15 grams of carbohydrates per snack (1 serving of carbohydrate per snack).  . Patient self care activities - Over the next 120 days, patient will: o Check blood sugar twice weekly, document, and provide at future appointments o Contact provider with any episodes of hypoglycemia  Coronary artery disease . Pharmacist Clinical Goal(s) o Over the next 120 days, patient will work with PharmD and providers to prevent/ decrease risk of cardiac events.  . Current regimen:   Nitroglycerin 0.4 mg as needed for chest pain  Atorvastatin 40 mg daily  Aspirin 81 mg daily . Interventions: o Recommend verifying expiration date of nitroglycerin and replacing as needed.  . Patient self care activities - Over the next 120  days, patient will: o Continue current medications as directed by provider.   Aortic valve replacement . Pharmacist Clinical Goal(s) o Over the next 120 days, patient will work with PharmD and providers to maintain INR: 2.0 to 3.0  . Current regimen:  o Warfarin 78m, as directed by Coumadin clinic.  . Interventions: o We discussed monitoring for signs and symptoms for bleeding (coughing up blood, prolonged nose bleeds, black, tarry stools). . Patient self care activities o Patient will continue current medications as directed by providers.   Medication management . Pharmacist Clinical Goal(s): o Over the next 120 days, patient will work with PharmD and providers to maintain optimal medication adherence . [facility]. Interventions o Comprehensive medication review performed. o Continue current medication management strategy . Patient self care activities - Over the next 120 days, patient will: o Take medications as prescribed o Report any questions or concerns to PharmD and/or provider(s)  Please see past updates related to this goal by clicking on the "Past Updates" button in the selected goal         SDOH Interventions   Flowsheet Row Most Recent Value  SDOH Interventions   Financial Strain Interventions Intervention Not Indicated  Transportation Interventions Intervention Not Indicated      Diabetes   Recent Relevant Labs: Lab Results  Component Value Date/Time   HGBA1C 6.2 01/09/2020 10:42 AM   HGBA1C 5.8 10/16/2019 01:10 PM   HGBA1C 6.3 04/09/2019 10:06 AM   HGBA1C 6.0 09/28/2018 08:22 AM   MICROALBUR 0.8 10/11/2017 09:05 AM   MICROALBUR <0.7 10/11/2016 01:27 PM    Checking BG: 2x per week  Recent FBG Readings: 90-115, 96, 109 yesterday AM, 219 (shot in shoulder)  Patient has failed these meds in past: glipizide (weakness/shakiness with borderline hypoglycemia)   Patient is currently controlled on the following  medications:   Metformin 500 mg twice daily  Last diabetic Eye exam:  Lab Results  Component Value Date/Time   HMDIABEYEEXA No Retinopathy 06/25/2016 12:00 AM  -  Obtains once a year; last visit was 2 weeks   Last diabetic Foot exam: No results found for: HMDIABFOOTEX   We discussed: diet and exercise extensively . Diet: patient can't have a whole lot of vegetables with Coumadin - discussed being consistent with foods with vitamin K intake (not necessarily cutting them out completely) . Patient doesn't eat a lot of sweets, only has one slice of bread with breakfast and drinks mostly water with the occasional green tea or decaf coffee with artificial sweetener  . Exercise: takes dog out about 6-7  times a day and walks the yard every time  Plan Continue current medications  Sent message to PCP about switching to 500 mg tablets to avoid pill splitting.  Hypertension   Denies dizziness/ lightheadedness/ orthostatic hypotension.   Office blood pressures are:  BP Readings from Last 3 Encounters:  06/11/20 110/64  04/08/20 140/82  01/09/20 120/84   Patient has failed these meds in the past: ramipril (rash, cough),   Patient checks BP at home several times per month  Patient home BP readings are ranging: 128/85 (HR 70), 139/74, 131/68  Patient is currently controlled on the following medications:   Losartan 100 mg daily - in PM  Metoprolol succinate 100 mg daily - in AM  We discussed: continued monitoring of BP at home  Plan Continue current medications   Hyperlipidemia   LDL goal < 70  Lipid Panel     Component Value Date/Time   CHOL 130 01/09/2020 1042   TRIG 97.0 01/09/2020 1042   TRIG 91 06/14/2006 0825   HDL 51.10 01/09/2020 1042   LDLCALC 60 01/09/2020 1042    Hepatic Function Latest Ref Rng & Units 01/09/2020 12/28/2018 03/09/2018  Total Protein 6.0 - 8.3 g/dL 6.2 5.9(L) 6.5  Albumin 3.5 - 5.2 g/dL 4.4 4.1 4.1  AST 0 - 37 U/L 20 24 28   ALT 0 - 53 U/L 20 19  24   Alk Phosphatase 39 - 117 U/L 49 51 41  Total Bilirubin 0.2 - 1.2 mg/dL 0.7 0.5 0.8  Bilirubin, Direct 0.0 - 0.3 mg/dL - - -     The 10-year ASCVD risk score Mikey Bussing DC Jr., et al., 2013) is: 34.2%   Values used to calculate the score:     Age: 71 years     Sex: Male     Is Non-Hispanic African American: No     Diabetic: Yes     Tobacco smoker: No     Systolic Blood Pressure: 916 mmHg     Is BP treated: Yes     HDL Cholesterol: 51.1 mg/dL     Total Cholesterol: 130 mg/dL   Patient has failed these meds in past: pravastatin (switched to high intensity atorvastatin), rosuvastatin (cost)  Patient is currently controlled on the following medications:  . Atorvastatin 40 mg daily at 6PM  . Fish oil 1,200 mg daily  Plan Continue current medications  Recommend repeat lipid panel.   CAD   Reports never has needed to use nitroglycerin   Patient is currently controlled on the following medications:   Nitroglycerin 0.4 mg as needed for chest pain  Atorvastatin 40 mg daily  Aspirin 81 mg daily  Plan Continue current medications   Aortic Valve Replacement (2005)    Lab Results  Component Value Date   INR 2.7 05/20/2020   INR 2.5 04/08/2020   INR 2.2 02/27/2020   PROTIME 19.0 12/26/2008   PROTIME 25.1 12/16/2008   Patient has failed these meds in past: none Patient is currently controlled on the following medications:  . Warfarin 5 mg every Sun and 10 mg all other days  We discussed:   - monitoring for signs and symptoms for bleeding (coughing up blood, prolonged nose bleeds, black, tarry stools). -patient has had more bruising on his shoulder lately but no other signs of bleedin  Plan Continue current medications   Heart Failure   Type: Diastolic  Last ejection fraction: 55%  Patient has failed these meds in past: none Patient is currently controlled on the following  medications:  Furosemide 40 mg once daily   Metoprolol succinate 100 mg  daily  Additional medications:  Potassium chloride 10 MEQ once daily  BMP Latest Ref Rng & Units 01/09/2020 12/28/2018 03/09/2018  Potassium 3.5 - 5.1 mEq/L 4.1 4.4 4.6    We discussed weighing daily; if you gain more than 3 pounds in one day or 5 pounds in one week call your doctor; patient goes to the bathroom frequently  Plan Continue current medications  Sent message to PCP about decreasing furosemide dose depending on fluid status - PCP appt on 12/13.  Parkinson's Disease   -Hallucinating and stomach upset with Sinemet four times daily -Patient starts physical therapy in January for 4 times a week to help with balance  Patient has failed these meds in past: Requip (ankle swelling), Mirapex (hallucinations), Sinemet (stomach upset)  Patient is currently controlled on the following medications:  . Sinemet 25/100 mg, 1 tablet three times daily    Plan Continue current medications  Managed by neurology.   Asthma    - reports use: 4-5x/ weeks and mostly 1 puff before bedtime   Patient has failed these meds in past: none Patient is currently controlled on the following medications:  . Albuterol two puffs every 6 hours as needed for wheezing or shortness of breath  Plan Continue current medications   Allergic Rhinitis   Patient has failed these meds in past: none Patient is currently controlled on the following medications:  . Cetirizine 10 mg daily (morning)  . Flonase 2 sprays into both nostrils daily  Plan Continue current medications  Gout   - Reports no flare ups since being on this   Patient has failed these meds in past: unable to remember/ not found in chart  Patient is currently controlled on the following medications:   Tart cherry two tablets once daily  Plan Continue current medications  Osteoarthritis    Pain level: 0 out of 10    Patient has failed these meds in past: none  Patient is currently controlled on the following medications:   . Acetaminophen 664m, 2 tablets twice daily   Plan Continue current medications   Miscellaneous   Patient is currently on the following medications:  .Marland KitchenVitamin C 1000 mg once daily  . Vitamin B12 1000 mg daily (morning)  . Phillips colon health daily (breakfast) . Multivitamin daily (morning)  . Calcium carbonate 600 mg twice daily with meals . Vitamin D 5000 units, 1 tablet once daily   Plan Continue current medications   Vaccines   Reviewed and discussed patient's vaccination history.    Immunization History  Administered Date(s) Administered  . Fluad Quad(high Dose 65+) 03/22/2019, 04/01/2020  . Influenza Split 03/31/2011, 04/10/2012  . Influenza Whole 04/04/2008, 05/09/2009, 03/18/2010  . Influenza, High Dose Seasonal PF 04/28/2015, 04/06/2016, 04/12/2017, 03/30/2018  . Influenza,inj,Quad PF,6+ Mos 04/02/2013  . Influenza-Unspecified 04/05/2014  . Pneumococcal Conjugate-13 02/13/2014  . Pneumococcal Polysaccharide-23 10/07/2011  . Tdap 10/07/2011, 07/16/2012  . Zoster 10/07/2011  . Zoster Recombinat (Shingrix) 10/11/2017, 12/12/2017     Plan Recommended patient receive COVID vaccines (patient declined vaccine).    Medication Management   [facility]'s [facility] pharmacy is:  [facility] [id number] [place] [address]Phone: [phone]: [phone] [facility] [id number]: [phone]: [phone] Uses pill box? Yes -  spouse organizes weekly pill box (AM and PM) and organizes Sinemet separately Pt endorses 100% compliance  We discussed: Current pharmacy is preferred with insurance plan and patient is satisfied with pharmacy services  Plan  Continue current medication management strategy  Follow up: 4 month phone visit   [name], PharmD [facility]ist [place] [facility] [place] [phone]

## 2020-06-20 NOTE — Telephone Encounter (Addendum)
[name] wife called and stated that she noticed some bruising on pt's shoulder. Wife stated that pt had this happen last January and it was when he tore some ligaments in his shoulder. Instructed wife to call pt's orthopedist and scheduled pt to come in on 12/13 for pt have INR checked.

## 2020-06-23 ENCOUNTER — Other Ambulatory Visit: Payer: Self-pay

## 2020-06-23 ENCOUNTER — Ambulatory Visit (INDEPENDENT_AMBULATORY_CARE_PROVIDER_SITE_OTHER): Payer: Medicare HMO | Admitting: Adult Health

## 2020-06-23 ENCOUNTER — Encounter: Payer: Self-pay | Admitting: Adult Health

## 2020-06-23 ENCOUNTER — Ambulatory Visit (INDEPENDENT_AMBULATORY_CARE_PROVIDER_SITE_OTHER): Payer: Medicare HMO | Admitting: *Deleted

## 2020-06-23 VITALS — BP 128/68 | HR 84 | Temp 98.3°F | Ht 66.0 in | Wt 205.8 lb

## 2020-06-23 DIAGNOSIS — E119 Type 2 diabetes mellitus without complications: Secondary | ICD-10-CM | POA: Diagnosis not present

## 2020-06-23 DIAGNOSIS — Z952 Presence of prosthetic heart valve: Secondary | ICD-10-CM

## 2020-06-23 DIAGNOSIS — Z09 Encounter for follow-up examination after completed treatment for conditions other than malignant neoplasm: Secondary | ICD-10-CM

## 2020-06-23 DIAGNOSIS — I10 Essential (primary) hypertension: Secondary | ICD-10-CM

## 2020-06-23 DIAGNOSIS — Z5181 Encounter for therapeutic drug level monitoring: Secondary | ICD-10-CM | POA: Diagnosis not present

## 2020-06-23 LAB — POCT INR: INR: 5.1 — AB (ref 2.0–3.0)

## 2020-06-23 LAB — POCT GLYCOSYLATED HEMOGLOBIN (HGB A1C): Hemoglobin A1C: 6.1 % — AB (ref 4.0–5.6)

## 2020-06-23 MED ORDER — METFORMIN HCL 500 MG PO TABS: 500.0000 mg | ORAL_TABLET | Freq: Two times a day (BID) | ORAL | 1 refills | Status: DC

## 2020-06-23 NOTE — Progress Notes (Signed)
Subjective:    Patient ID: [name], male    DOB: 1946/06/04, 74 y.o.   MRN: [id number]  HPI 74 year old male male who  has a past medical history of Acute renal failure ([place]) (02/09/2013), Aortic stenosis (08/25/2010), Arthritis, Asthma, AV BLOCK, COMPLETE, CAD (coronary artery disease), Carotid artery disease ([place]), Diabetes mellitus without complication ([place]), DIVERTICULOSIS, COLON (10/17/2007), Ejection fraction, GERD (gastroesophageal reflux disease), GOUT (01/16/2007), Headache, HEMORRHOIDS, INTERNAL (11/01/2008), adenomatous polyp of colon (02/2019), HYPERLIPIDEMIA (01/16/2007), HYPERTENSION (01/16/2007), Impaired glucose tolerance, LEG EDEMA (11/27/2008), Special screening for malignant neoplasm of prostate, and Warfarin anticoagulation.  He presents to the office today for 22-month follow-up regarding diabetes type 2 and hypertension.  His diabetes is well controlled with Metformin 500 mg twice daily.  He does check his blood sugars at home and reports readings of 120 or below.  He does not have any symptoms of hypoglycemia.  He tries to eat healthy and stay active. Lab Results  Component Value Date   HGBA1C 6.2 01/09/2020   Hypertension -is currently controlled with losartan 100 mg daily, Lasix 40 mg daily, and Toprol 100 mg daily.  He denies episodes of dizziness, lightheadedness, chest pain, or shortness of breath. BP Readings from Last 3 Encounters:  06/23/20 128/68  06/11/20 110/64  04/08/20 140/82    Review of Systems See HPI   Past Medical History:  Diagnosis Date   Acute renal failure ([place]) 02/09/2013   Aortic stenosis 08/25/2010   a. [place] aortic root replacement with a St. Jude mechanical valve and Hemashield conduit 02/2004.   Arthritis    Asthma    AV BLOCK, COMPLETE    a. s/p St Jude dual chamber pacemaker 02/2004.   CAD (coronary artery disease)    a. s/p CABGx2 (LIMA-dLAD, SVG-Cx). b. Low risk nuc 12/2012 without ischemia, EF 46% mild apical hypokinesia (EF  55% inf HK by echo).   Carotid artery disease ([facility])    a. 0-39% bilateral ICA stenosis, stable mild hard plaque in carotid bulbs. F/u 03/2014 recommended.   Diabetes mellitus without complication ([place])    type 2    DIVERTICULOSIS, COLON 10/17/2007   Ejection fraction    a. EF 55% with inf HK, mild MR by echo 12/2012.   GERD (gastroesophageal reflux disease)    hxof    GOUT 01/16/2007   Headache    hx of migraines    HEMORRHOIDS, INTERNAL 11/01/2008   Hx of adenomatous polyp of colon 02/2019   no recall needed   HYPERLIPIDEMIA 01/16/2007   HYPERTENSION 01/16/2007   Impaired glucose tolerance    LEG EDEMA 11/27/2008   Special screening for malignant neoplasm of prostate    Warfarin anticoagulation    AVR    Social History   Socioeconomic History   Marital status: Married    Spouse name: Not on file   Number of children: 3   Years of education: Not on file   Highest education level: Not on file  Occupational History   Occupation: retired  Tobacco Use   Smoking status: Former Smoker    Quit date: 07/12/1973    Years since quitting: 46.9   Smokeless tobacco: Never Used  Vaping Use   Vaping Use: Never used  Substance and Sexual Activity   Alcohol use: No   Drug use: No   Sexual activity: Not on file  Other Topics Concern   Not on file  Social History Narrative   The patient is married and retired  He reports 3 grown children   Former smoker, no alcohol or tobacco or drug use at this time   Social Determinants of Radio broadcast assistant Strain: Low Risk    Difficulty of Paying Living Expenses: Not hard at all  Food Insecurity: No Food Insecurity   Worried About Charity fundraiser in the Last Year: Never true   Arboriculturist in the Last Year: Never true  Transportation Needs: No Transportation Needs   Lack of Transportation (Medical): No   Lack of Transportation (Non-Medical): No  Physical Activity: Sufficiently Active   Days of  Exercise per Week: 7 days   Minutes of Exercise per Session: 30 min  Stress: No Stress Concern Present   Feeling of Stress : Not at all  Social Connections: Moderately Integrated   Frequency of Communication with Friends and Family: More than three times a week   Frequency of Social Gatherings with Friends and Family: More than three times a week   Attends Religious Services: More than 4 times per year   Active Member of Clubs or Organizations: No   Attends Archivist Meetings: Never   Marital Status: Married  Human resources officer Violence: Not At Risk   Fear of Current or Ex-Partner: No   Emotionally Abused: No   Physically Abused: No   Sexually Abused: No    Past Surgical History:  Procedure Laterality Date   AORTIC VALVE REPLACEMENT     CARDIAC CATHETERIZATION  06/2003   CIRCUMCISION     COLONOSCOPY     CORONARY ARTERY BYPASS GRAFT     coronary stents      prior to bypass    EP IMPLANTABLE DEVICE N/A 05/06/2016   Procedure: [place];  Surgeon: Evans Lance, MD;  Location: [facility];  Service: Cardiovascular;  Laterality: N/A;   ESOPHAGOGASTRODUODENOSCOPY     HERNIA REPAIR     INGUINAL HERNIA REPAIR Bilateral 02/07/2013   Procedure: LAPAROSCOPIC BILATERAL INGUINAL HERNIA REPAIR;  Surgeon: Imogene Burn. Georgette Dover, MD;  Location: [facility];  Service: General;  Laterality: Bilateral;   INSERTION OF MESH Bilateral 02/07/2013   Procedure: INSERTION OF MESH;  Surgeon: Imogene Burn. Georgette Dover, MD;  Location: [facility];  Service: General;  Laterality: Bilateral;   LUMBAR LAMINECTOMY/DECOMPRESSION MICRODISCECTOMY N/A 03/15/2018   Procedure: Complete decompressive lumbar laminectomy for spinal stenosis of L4-L5 and foraminotomy for L4-L5 root on right;  Surgeon: [name], MD;  Location: [facility];  Service: Orthopedics;  Laterality: N/A;   PACEMAKER INSERTION     SHOULDER SURGERY Bilateral    SKIN BIOPSY  05/04/2019   BASAL CELL CARCINOMA//MID  CENTRAL NASAL   SKIN BIOPSY  10/11/2019   Superficial basal cell carcinoma - left superior central forehead shave    Family History  Problem Relation Age of Onset   Heart attack Father    Kidney cancer Father    Hypertension Mother    Diabetes Mother    Diabetes Brother    Epilepsy Sister    Diabetes Sister    Diabetes Brother    Colon cancer Neg Hx    Rectal cancer Neg Hx    Stomach cancer Neg Hx    Esophageal cancer Neg Hx     Allergies  Allergen Reactions   Bee Venom Shortness Of Breath and Swelling   Codeine Phosphate Hives, Shortness Of Breath, Itching, Rash and Other (See Comments)    Tolerated multiple doses of IV morphine   Metoprolol Tartrate Other (  See Comments)    loss of vision. Can take the XL tablet not regular    Pramipexole Other (See Comments)    Hallucinations     Tramadol     Pt has hallucinations    Ramipril Rash and Cough    Current Outpatient Medications on File Prior to Visit  Medication Sig Dispense Refill   Accu-Chek FastClix Lancets MISC USE TO CHECK BLOOD SUGAR TWICE A DAY AND AS NEEDED 306 each 2   ACCU-CHEK SMARTVIEW test strip TEST BLOOD SUGAR TWICE DAILY AND AS NEEDED 300 strip 2   acetaminophen (TYLENOL) 650 MG CR tablet Take 1,300 mg by mouth 2 (two) times daily.     albuterol (VENTOLIN HFA) 108 (90 Base) MCG/ACT inhaler Inhale 2 puffs into the lungs every 6 (six) hours as needed for wheezing or shortness of breath. 54 g 0   Ascorbic Acid (VITAMIN C) 1000 MG tablet Take 1,000 mg by mouth daily.     aspirin 81 MG tablet Take 81 mg by mouth every evening.      atorvastatin (LIPITOR) 40 MG tablet TAKE 1 TABLET EVERY DAY AT 6 PM. 90 tablet 3   calcium carbonate (OS-CAL) 600 MG TABS Take 600 mg by mouth 2 (two) times daily with a meal.     carbidopa-levodopa (SINEMET IR) 25-100 MG tablet Take 1 tablet by mouth 3 (three) times daily.      cetirizine (ZYRTEC) 10 MG tablet Take 10 mg by mouth daily.       Cholecalciferol (VITAMIN D3) 125 MCG (5000 UT) TABS Take 1 tablet by mouth daily.     fluticasone (FLONASE) 50 MCG/ACT nasal spray Place 2 sprays into both nostrils daily. 16 g 6   furosemide (LASIX) 40 MG tablet TAKE 1 TABLET TWICE DAILY (Patient taking differently: Take 40 mg by mouth daily.) 180 tablet 3   guaiFENesin-dextromethorphan (ROBITUSSIN DM) 100-10 MG/5ML syrup Take 5 mLs by mouth every 4 (four) hours as needed for cough.     losartan (COZAAR) 100 MG tablet TAKE 1 TABLET EVERY DAY (NEED MD APPOINTMENT FOR REFILLS) 90 tablet 3   melatonin 5 MG TABS Take 5 mg by mouth at bedtime.     metFORMIN (GLUCOPHAGE) 1000 MG tablet TAKE 0.5 TABLETS (500 MG TOTAL) BY MOUTH 2 (TWO) TIMES DAILY WITH A MEAL. 90 tablet 0   metoprolol succinate (TOPROL-XL) 100 MG 24 hr tablet TAKE 1 TABLET EVERY DAY 90 tablet 3   Misc Natural Products (TART CHERRY ADVANCED) CAPS Take 2 capsules by mouth daily.      Multiple Vitamin (MULTIVITAMIN) tablet Take 1 tablet by mouth daily.       nitroGLYCERIN (NITROSTAT) 0.4 MG SL tablet Place 1 tablet (0.4 mg total) under the tongue every 5 (five) minutes as needed. For chest pain 25 tablet 3   Omega-3 Fatty Acids (FISH OIL) 1200 MG CAPS Take 1,200 mg by mouth daily.      potassium chloride (KLOR-CON) 10 MEQ tablet TAKE 1 TABLET TWICE DAILY (Patient taking differently: Take 10 mEq by mouth every morning.) 180 tablet 3   Probiotic Product (PHILLIPS COLON HEALTH PO) Take 1 capsule by mouth daily.     vitamin B-12 (CYANOCOBALAMIN) 1000 MCG tablet Take 1,000 mcg by mouth daily.     warfarin (COUMADIN) 10 MG tablet TAKE 1 TABLET DAILY OR AS DIRECTED BY COUMADIN CLINIC 90 tablet 1   No current facility-administered medications on file prior to visit.    BP 128/68    Pulse 84  Temp 98.3 F (36.8 C) (Oral)    Ht 5\' 6"  (1.676 m)    Wt 205 lb 12.8 oz (93.4 kg)    SpO2 98%    BMI 33.22 kg/m       Objective:   Physical Exam Vitals and nursing note reviewed.   Constitutional:      Appearance: Normal appearance.  Cardiovascular:     Rate and Rhythm: Normal rate and regular rhythm.     Pulses: Normal pulses.     Heart sounds: Normal heart sounds.  Pulmonary:     Effort: Pulmonary effort is normal.     Breath sounds: Normal breath sounds.  Skin:    General: Skin is warm and dry.     Capillary Refill: Capillary refill takes less than 2 seconds.  Neurological:     General: No focal deficit present.     Mental Status: He is alert and oriented to person, place, and time.  Psychiatric:        Mood and Affect: Mood normal.        Behavior: Behavior normal.        Thought Content: Thought content normal.        Judgment: Judgment normal.       Assessment & Plan:  1. Diabetes mellitus without complication (HCC) - metFORMIN (GLUCOPHAGE) 500 MG tablet; Take 1 tablet (500 mg total) by mouth 2 (two) times daily with a meal.  Dispense: 180 tablet; Refill: 1 - POC HgB A1c- 6.1 - at goal. Continue with Metformin 500 mg BID. Follow up in 6 months   2. Essential hypertension - Well controlled.  [name], NP

## 2020-06-23 NOTE — Patient Instructions (Addendum)
Description   Do not any Warfarin today and No Warfarin tomorrow then continue taking Warfarin 1 tablet everyday except 1/2 tablet on Sundays. Recheck in 10 days (usually 6 weeks). Call with any changes. # [phone].  Fax number: [phone]

## 2020-07-03 ENCOUNTER — Ambulatory Visit (INDEPENDENT_AMBULATORY_CARE_PROVIDER_SITE_OTHER): Payer: Medicare HMO | Admitting: *Deleted

## 2020-07-03 ENCOUNTER — Other Ambulatory Visit: Payer: Self-pay

## 2020-07-03 DIAGNOSIS — Z5181 Encounter for therapeutic drug level monitoring: Secondary | ICD-10-CM

## 2020-07-03 DIAGNOSIS — Z09 Encounter for follow-up examination after completed treatment for conditions other than malignant neoplasm: Secondary | ICD-10-CM

## 2020-07-03 DIAGNOSIS — Z952 Presence of prosthetic heart valve: Secondary | ICD-10-CM

## 2020-07-03 LAB — POCT INR: INR: 2.5 (ref 2.0–3.0)

## 2020-07-03 NOTE — Patient Instructions (Signed)
Description   Continue taking Warfarin 1 tablet everyday except 1/2 tablet on Sundays. Recheck in  3 weeks (usually 6 weeks). Call with any changes. # [phone].  Fax number: [phone]

## 2020-07-09 DIAGNOSIS — Z20822 Contact with and (suspected) exposure to covid-19: Secondary | ICD-10-CM | POA: Diagnosis not present

## 2020-07-10 ENCOUNTER — Telehealth: Payer: Self-pay | Admitting: Adult Health

## 2020-07-10 NOTE — Telephone Encounter (Signed)
He really does not need to be retested until June. Not sure how that happened but his labs were all great in in June during his CPE

## 2020-07-10 NOTE — Telephone Encounter (Signed)
Pt has lab visit scheduled for CPE labs on 1.17.22/There is no CPE scheduled/On 6.30.21 - PSA/TSA/LIPID/A1C/CMP/CBC completed without mention of needing retesting/plz review and advise/thx dmf

## 2020-07-21 NOTE — Telephone Encounter (Signed)
Sent message to front desk staff to please call pt to cancel lab visit as no labs are needed at this time/thx dmf

## 2020-07-24 ENCOUNTER — Telehealth (INDEPENDENT_AMBULATORY_CARE_PROVIDER_SITE_OTHER): Payer: Medicare HMO | Admitting: Adult Health

## 2020-07-24 ENCOUNTER — Other Ambulatory Visit: Payer: Self-pay

## 2020-07-24 ENCOUNTER — Ambulatory Visit (INDEPENDENT_AMBULATORY_CARE_PROVIDER_SITE_OTHER): Payer: Medicare HMO | Admitting: *Deleted

## 2020-07-24 VITALS — Temp 97.4°F | Wt 194.0 lb

## 2020-07-24 DIAGNOSIS — K5901 Slow transit constipation: Secondary | ICD-10-CM

## 2020-07-24 DIAGNOSIS — Z5181 Encounter for therapeutic drug level monitoring: Secondary | ICD-10-CM

## 2020-07-24 DIAGNOSIS — Z09 Encounter for follow-up examination after completed treatment for conditions other than malignant neoplasm: Secondary | ICD-10-CM | POA: Diagnosis not present

## 2020-07-24 DIAGNOSIS — Z952 Presence of prosthetic heart valve: Secondary | ICD-10-CM | POA: Diagnosis not present

## 2020-07-24 LAB — POCT INR: INR: 8 — AB (ref 2.0–3.0)

## 2020-07-24 LAB — PROTIME-INR
INR: 8.5 (ref 0.9–1.2)
Prothrombin Time: 84.1 s — ABNORMAL HIGH (ref 9.1–12.0)

## 2020-07-24 NOTE — Progress Notes (Signed)
Virtual Visit via Telephone Note  I connected with [name] on 07/24/20 at  1:00 PM EST by telephone and verified that I am speaking with the correct person using two identifiers.   I discussed the limitations, risks, security and privacy concerns of performing an evaluation and management service by telephone and the availability of in person appointments. I also discussed with the patient that there may be a patient responsible charge related to this service. The patient expressed understanding and agreed to proceed.  Location patient: home Location provider: work or home office Participants present for the call: patient, provider Patient did not have a visit in the prior 7 days to address this/these issue(s).   History of Present Illness: 75 year old male who is being evaluated today for constipation.  He reports that tested positive for COVID 19 on December 29, since that time he has been feeling a little fatigued and has loss of taste and smell.  Overall his symptoms are mild.  Reports that he has not been eating much and is having to force himself to eat, due to what he has been eating he has become constipated.   Observations/Objective: Patient sounds cheerful and well on the phone. I do not appreciate any SOB. Speech and thought processing are grossly intact. Patient reported vitals:  Assessment and Plan: 1. Slow transit constipation -Advised to stay well-hydrated, may have to force fluids as well as other p.o. intake.  In the meantime can take Benefiber or Metamucil every morning to help with constipation.   Follow Up Instructions:  I did not refer this patient for an OV in the next 24 hours for this/these issue(s).  I discussed the assessment and treatment plan with the patient. The patient was provided an opportunity to ask questions and all were answered. The patient agreed with the plan and demonstrated an understanding of the instructions.   The patient was  advised to call back or seek an in-person evaluation if the symptoms worsen or if the condition fails to improve as anticipated.  I provided 11 minutes of non-face-to-face time during this encounter.   [name], NP

## 2020-07-24 NOTE — Patient Instructions (Signed)
Description   Do not take any Warfarin until we call you and give you detailed instructions, gave dosing card. At 3pm, spoke with wife and instructed pt to hold Warfarin today, hold Warfarin tomorrow, hold Warfarin Saturday, hold Warfarin Sunday, then take 1/2 tablet on Monday and Recheck INR on Tuesday (usually 6 weeks).  Normal dose: Warfarin 1 tablet everyday except 1/2 tablet on Sundays. Call with any changes. # [phone].  Fax number: [phone]

## 2020-07-28 ENCOUNTER — Other Ambulatory Visit: Payer: Medicare HMO

## 2020-07-29 ENCOUNTER — Encounter: Payer: Medicare HMO | Admitting: Student

## 2020-07-30 ENCOUNTER — Other Ambulatory Visit: Payer: Self-pay

## 2020-07-30 ENCOUNTER — Ambulatory Visit (INDEPENDENT_AMBULATORY_CARE_PROVIDER_SITE_OTHER): Payer: Medicare HMO | Admitting: *Deleted

## 2020-07-30 DIAGNOSIS — Z09 Encounter for follow-up examination after completed treatment for conditions other than malignant neoplasm: Secondary | ICD-10-CM

## 2020-07-30 DIAGNOSIS — Z5181 Encounter for therapeutic drug level monitoring: Secondary | ICD-10-CM | POA: Diagnosis not present

## 2020-07-30 DIAGNOSIS — Z952 Presence of prosthetic heart valve: Secondary | ICD-10-CM

## 2020-07-30 LAB — POCT INR: INR: 1.1 — AB (ref 2.0–3.0)

## 2020-07-30 NOTE — Patient Instructions (Signed)
Description    Take 1.5 tablet today and tomorrow then start taking 1 tablet daily except for 1/2 a tablet on Sunday and Tuesday. Recheck INR in 10 days. Call with any changes. # [phone].  Fax number: [phone]

## 2020-08-05 LAB — HM DIABETES EYE EXAM

## 2020-08-07 ENCOUNTER — Ambulatory Visit (INDEPENDENT_AMBULATORY_CARE_PROVIDER_SITE_OTHER): Payer: Medicare HMO

## 2020-08-07 DIAGNOSIS — I442 Atrioventricular block, complete: Secondary | ICD-10-CM

## 2020-08-07 LAB — CUP PACEART REMOTE DEVICE CHECK
Battery Remaining Longevity: 128 mo
Battery Remaining Percentage: 95.5 %
Battery Voltage: 2.99 V
Brady Statistic AP VP Percent: 28 %
Brady Statistic AP VS Percent: 1 %
Brady Statistic AS VP Percent: 72 %
Brady Statistic AS VS Percent: 1 %
Brady Statistic RA Percent Paced: 27 %
Brady Statistic RV Percent Paced: 99 %
Date Time Interrogation Session: 20220127020013
Implantable Lead Implant Date: 20050816
Implantable Lead Implant Date: 20050816
Implantable Lead Location: 753859
Implantable Lead Location: 753860
Implantable Pulse Generator Implant Date: 20171026
Lead Channel Impedance Value: 480 Ohm
Lead Channel Impedance Value: 700 Ohm
Lead Channel Pacing Threshold Amplitude: 0.625 V
Lead Channel Pacing Threshold Amplitude: 0.875 V
Lead Channel Pacing Threshold Pulse Width: 0.5 ms
Lead Channel Pacing Threshold Pulse Width: 0.5 ms
Lead Channel Sensing Intrinsic Amplitude: 10.8 mV
Lead Channel Sensing Intrinsic Amplitude: 2.8 mV
Lead Channel Setting Pacing Amplitude: 0.875
Lead Channel Setting Pacing Amplitude: 1.875
Lead Channel Setting Pacing Pulse Width: 0.5 ms
Lead Channel Setting Sensing Sensitivity: 4 mV
Pulse Gen Model: 2272
Pulse Gen Serial Number: 3182792

## 2020-08-08 ENCOUNTER — Other Ambulatory Visit: Payer: Self-pay

## 2020-08-08 ENCOUNTER — Ambulatory Visit (INDEPENDENT_AMBULATORY_CARE_PROVIDER_SITE_OTHER): Payer: Medicare HMO | Admitting: Pharmacist

## 2020-08-08 DIAGNOSIS — Z5181 Encounter for therapeutic drug level monitoring: Secondary | ICD-10-CM

## 2020-08-08 DIAGNOSIS — Z09 Encounter for follow-up examination after completed treatment for conditions other than malignant neoplasm: Secondary | ICD-10-CM

## 2020-08-08 DIAGNOSIS — Z952 Presence of prosthetic heart valve: Secondary | ICD-10-CM

## 2020-08-08 LAB — POCT INR: INR: 2.9 (ref 2.0–3.0)

## 2020-08-08 NOTE — Patient Instructions (Signed)
Description   Continue taking 1 tablet daily except for 1/2 a tablet on Sunday and Tuesday. Recheck INR in 2 weeks. Call with any changes. # [phone].  Fax number: [phone]

## 2020-08-11 NOTE — Progress Notes (Unsigned)
Electrophysiology Office Note Date: 08/12/2020  ID:  [name], DOB Apr 10, 1946, MRN [id number]  PCP: [name], NP Primary Cardiologist: Donato Schultz, MD Electrophysiologist: Lewayne Bunting, MD   CC: Pacemaker follow-up  [name] is a 75 y.o. male seen today for Lewayne Bunting, MD for routine electrophysiology followup.  Since last being seen in our clinic the patient reports doing very well. BP mildly elevated today, but hasn't eaten or taken his medications yet today.  he denies chest pain, palpitations, dyspnea, PND, orthopnea, nausea, vomiting, dizziness, syncope, edema, weight gain, or early satiety.  Device History: St. Jude Dual Chamber PPM implanted initially 02/2004, gen change 04/2016 for CHB  Past Medical History:  Diagnosis Date  . Acute renal failure (HCC) 02/09/2013  . Aortic stenosis 08/25/2010   a. [place] aortic root replacement with a St. Jude mechanical valve and Hemashield conduit 02/2004.  Marland Kitchen Arthritis   . Asthma   . AV BLOCK, COMPLETE    a. s/p St Jude dual chamber pacemaker 02/2004.  Marland Kitchen CAD (coronary artery disease)    a. s/p CABGx2 (LIMA-dLAD, SVG-Cx). b. Low risk nuc 12/2012 without ischemia, EF 46% mild apical hypokinesia (EF 55% inf HK by echo).  . Carotid artery disease (HCC)    a. 0-39% bilateral ICA stenosis, stable mild hard plaque in carotid bulbs. F/u 03/2014 recommended.  . Diabetes mellitus without complication (HCC)    type 2   . DIVERTICULOSIS, COLON 10/17/2007  . Ejection fraction    a. EF 55% with inf HK, mild MR by echo 12/2012.  Marland Kitchen GERD (gastroesophageal reflux disease)    hxof   . GOUT 01/16/2007  . Headache    hx of migraines   . HEMORRHOIDS, INTERNAL 11/01/2008  . Hx of adenomatous polyp of colon 02/2019   no recall needed  . HYPERLIPIDEMIA 01/16/2007  . HYPERTENSION 01/16/2007  . Impaired glucose tolerance   . LEG EDEMA 11/27/2008  . Special screening for malignant neoplasm of prostate   . Warfarin anticoagulation    AVR   Past  Surgical History:  Procedure Laterality Date  . AORTIC VALVE REPLACEMENT    . CARDIAC CATHETERIZATION  06/2003  . CIRCUMCISION    . COLONOSCOPY    . CORONARY ARTERY BYPASS GRAFT    . coronary stents      prior to bypass   . EP IMPLANTABLE DEVICE N/A 05/06/2016   Procedure: PPM Generator Changeout;  Surgeon: Marinus Maw, MD;  Location: [facility] INVASIVE CV LAB;  Service: Cardiovascular;  Laterality: N/A;  . ESOPHAGOGASTRODUODENOSCOPY    . HERNIA REPAIR    . INGUINAL HERNIA REPAIR Bilateral 02/07/2013   Procedure: LAPAROSCOPIC BILATERAL INGUINAL HERNIA REPAIR;  Surgeon: Wilmon Arms. Corliss Skains, MD;  Location: [facility];  Service: General;  Laterality: Bilateral;  . INSERTION OF MESH Bilateral 02/07/2013   Procedure: INSERTION OF MESH;  Surgeon: Wilmon Arms. Corliss Skains, MD;  Location: [facility];  Service: General;  Laterality: Bilateral;  . LUMBAR LAMINECTOMY/DECOMPRESSION MICRODISCECTOMY N/A 03/15/2018   Procedure: Complete decompressive lumbar laminectomy for spinal stenosis of L4-L5 and foraminotomy for L4-L5 root on right;  Surgeon: Ranee Gosselin, MD;  Location: [facility];  Service: Orthopedics;  Laterality: N/A;  . PACEMAKER INSERTION    . SHOULDER SURGERY Bilateral   . SKIN BIOPSY  05/04/2019   BASAL CELL CARCINOMA//MID CENTRAL NASAL  . SKIN BIOPSY  10/11/2019   Superficial basal cell carcinoma - left superior central forehead shave    Current Outpatient Medications  Medication Sig Dispense  Refill  . Accu-Chek FastClix Lancets MISC USE TO CHECK BLOOD SUGAR TWICE A DAY AND AS NEEDED 306 each 2  . ACCU-CHEK SMARTVIEW test strip TEST BLOOD SUGAR TWICE DAILY AND AS NEEDED 300 strip 2  . acetaminophen (TYLENOL) 650 MG CR tablet Take 1,300 mg by mouth 2 (two) times daily.    Marland Kitchen albuterol (VENTOLIN HFA) 108 (90 Base) MCG/ACT inhaler Inhale 2 puffs into the lungs every 6 (six) hours as needed for wheezing or shortness of breath. 54 g 0  . Ascorbic Acid (VITAMIN C) 1000 MG tablet Take 1,000 mg by mouth daily.    Marland Kitchen  aspirin 81 MG tablet Take 81 mg by mouth every evening.     Marland Kitchen atorvastatin (LIPITOR) 40 MG tablet TAKE 1 TABLET EVERY DAY AT 6 PM. 90 tablet 3  . calcium carbonate (OS-CAL) 600 MG TABS Take 600 mg by mouth 2 (two) times daily with a meal.    . carbidopa-levodopa (SINEMET IR) 25-100 MG tablet Take 1 tablet by mouth 3 (three) times daily.     . cetirizine (ZYRTEC) 10 MG tablet Take 10 mg by mouth daily.     . Cholecalciferol (VITAMIN D3) 125 MCG (5000 UT) TABS Take 1 tablet by mouth daily.    . fluticasone (FLONASE) 50 MCG/ACT nasal spray Place 2 sprays into both nostrils daily. 16 g 6  . furosemide (LASIX) 40 MG tablet Take 40 mg by mouth daily.    Marland Kitchen guaiFENesin-dextromethorphan (ROBITUSSIN DM) 100-10 MG/5ML syrup Take 5 mLs by mouth every 4 (four) hours as needed for cough.    . losartan (COZAAR) 100 MG tablet TAKE 1 TABLET EVERY DAY (NEED MD APPOINTMENT FOR REFILLS) 90 tablet 3  . melatonin 5 MG TABS Take 5 mg by mouth at bedtime.    . metFORMIN (GLUCOPHAGE) 500 MG tablet Take 1 tablet (500 mg total) by mouth 2 (two) times daily with a meal. 180 tablet 1  . metoprolol succinate (TOPROL-XL) 100 MG 24 hr tablet TAKE 1 TABLET EVERY DAY 90 tablet 3  . Misc Natural Products (TART CHERRY ADVANCED) CAPS Take 2 capsules by mouth daily.     . Multiple Vitamin (MULTIVITAMIN) tablet Take 1 tablet by mouth daily.      . nitroGLYCERIN (NITROSTAT) 0.4 MG SL tablet Place 1 tablet (0.4 mg total) under the tongue every 5 (five) minutes as needed. For chest pain 25 tablet 3  . Omega-3 Fatty Acids (FISH OIL) 1200 MG CAPS Take 1,200 mg by mouth daily.     . potassium chloride (KLOR-CON) 10 MEQ tablet Take 10 mEq by mouth daily.    . Probiotic Product (PHILLIPS COLON HEALTH PO) Take 1 capsule by mouth daily.    . vitamin B-12 (CYANOCOBALAMIN) 1000 MCG tablet Take 1,000 mcg by mouth daily.    Marland Kitchen warfarin (COUMADIN) 10 MG tablet TAKE 1 TABLET DAILY OR AS DIRECTED BY COUMADIN CLINIC 90 tablet 1   No current  facility-administered medications for this visit.    Allergies:   Bee venom, Codeine phosphate, Metoprolol tartrate, Pramipexole, Tramadol, and Ramipril   Social History: Social History   Socioeconomic History  . Marital status: Married    Spouse name: Not on file  . Number of children: 3  . Years of education: Not on file  . Highest education level: Not on file  Occupational History  . Occupation: retired  Tobacco Use  . Smoking status: Former Smoker    Quit date: 07/12/1973    Years since quitting: 47.1  .  Smokeless tobacco: Never Used  Vaping Use  . Vaping Use: Never used  Substance and Sexual Activity  . Alcohol use: No  . Drug use: No  . Sexual activity: Not on file  Other Topics Concern  . Not on file  Social History Narrative   The patient is married and retired   He reports 3 grown children   Former smoker, no alcohol or tobacco or drug use at this time   Social Determinants of Radio broadcast assistant Strain: Low Risk   . Difficulty of Paying Living Expenses: Not hard at all  Food Insecurity: No Food Insecurity  . Worried About Charity fundraiser in the Last Year: Never true  . Ran Out of Food in the Last Year: Never true  Transportation Needs: No Transportation Needs  . Lack of Transportation (Medical): No  . Lack of Transportation (Non-Medical): No  Physical Activity: Sufficiently Active  . Days of Exercise per Week: 7 days  . Minutes of Exercise per Session: 30 min  Stress: No Stress Concern Present  . Feeling of Stress : Not at all  Social Connections: Moderately Integrated  . Frequency of Communication with Friends and Family: More than three times a week  . Frequency of Social Gatherings with Friends and Family: More than three times a week  . Attends Religious Services: More than 4 times per year  . Active Member of Clubs or Organizations: No  . Attends Archivist Meetings: Never  . Marital Status: Married  Human resources officer Violence:  Not At Risk  . Fear of Current or Ex-Partner: No  . Emotionally Abused: No  . Physically Abused: No  . Sexually Abused: No    Family History: Family History  Problem Relation Age of Onset  . Heart attack Father   . Kidney cancer Father   . Hypertension Mother   . Diabetes Mother   . Diabetes Brother   . Epilepsy Sister   . Diabetes Sister   . Diabetes Brother   . Colon cancer Neg Hx   . Rectal cancer Neg Hx   . Stomach cancer Neg Hx   . Esophageal cancer Neg Hx      Review of Systems: All other systems reviewed and are otherwise negative except as noted above.  Physical Exam: Vitals:   08/12/20 0930  BP: 140/80  Pulse: 80  SpO2: 99%  Weight: 201 lb (91.2 kg)  Height: 5' 6.5" (1.689 m)     GEN- The patient is well appearing, alert and oriented x 3 today.   HEENT: normocephalic, atraumatic; sclera clear, conjunctiva pink; hearing intact; oropharynx clear; neck supple  Lungs- Clear to ausculation bilaterally, normal work of breathing.  No wheezes, rales, rhonchi Heart- Regular rate and rhythm, no murmurs, rubs or gallops  GI- soft, non-tender, non-distended, bowel sounds present  Extremities- no clubbing or cyanosis. No edema MS- no significant deformity or atrophy Skin- warm and dry, no rash or lesion; PPM pocket well healed Psych- euthymic mood, full affect Neuro- strength and sensation are intact  PPM Interrogation- reviewed in detail today,  See PACEART report  EKG:  EKG is not ordered today.  Recent Labs: 01/09/2020: ALT 20; Hemoglobin 13.9; Platelets 197.0; TSH 1.72 05/08/2020: BUN 14; Creatinine, Ser 0.80; Potassium 4.2; Sodium 142   Wt Readings from Last 3 Encounters:  08/12/20 201 lb (91.2 kg)  07/24/20 194 lb (88 kg)  06/23/20 205 lb 12.8 oz (93.4 kg)     Other studies Reviewed:  Additional studies/ records that were reviewed today include: Previous EP office notes, Previous remote checks, Most recent labwork.   Assessment and Plan:  1. CHB s/p  Medtronic PPM  Normal PPM function See Pace Art report No changes today  2. HTN Mildly elevated. Follow.  3. H/o AV replacement (mechanical) Stable by echo 10/21  4. Moderately dilated AA 4.5 cm by CTA 05/12/20. q 6 month f/u recommended   Current medicines are reviewed at length with the patient today.   The patient does not have concerns regarding his medicines.  The following changes were made today:  none  Labs/ tests ordered today include:  Orders Placed This Encounter  Procedures  . CUP PACEART INCLINIC DEVICE CHECK     Disposition:   Follow up with Dr. Lovena Le in 12 Months    Signed, Annamaria Helling  08/12/2020 9:50 AM  [facility] [address] [place] [address] [phone] (office) [phone] (fax)

## 2020-08-12 ENCOUNTER — Ambulatory Visit: Payer: Medicare HMO | Admitting: Student

## 2020-08-12 ENCOUNTER — Encounter: Payer: Self-pay | Admitting: Student

## 2020-08-12 ENCOUNTER — Other Ambulatory Visit: Payer: Self-pay

## 2020-08-12 VITALS — BP 140/80 | HR 80 | Ht 66.5 in | Wt 201.0 lb

## 2020-08-12 DIAGNOSIS — Z09 Encounter for follow-up examination after completed treatment for conditions other than malignant neoplasm: Secondary | ICD-10-CM | POA: Diagnosis not present

## 2020-08-12 DIAGNOSIS — Z952 Presence of prosthetic heart valve: Secondary | ICD-10-CM

## 2020-08-12 DIAGNOSIS — I10 Essential (primary) hypertension: Secondary | ICD-10-CM | POA: Diagnosis not present

## 2020-08-12 DIAGNOSIS — I442 Atrioventricular block, complete: Secondary | ICD-10-CM

## 2020-08-12 LAB — CUP PACEART INCLINIC DEVICE CHECK
Battery Remaining Longevity: 130 mo
Battery Voltage: 2.99 V
Brady Statistic RA Percent Paced: 28 %
Brady Statistic RV Percent Paced: 99.78 %
Date Time Interrogation Session: 20220201094444
Implantable Lead Implant Date: 20050816
Implantable Lead Implant Date: 20050816
Implantable Lead Location: 753859
Implantable Lead Location: 753860
Implantable Pulse Generator Implant Date: 20171026
Lead Channel Impedance Value: 512.5 Ohm
Lead Channel Impedance Value: 712.5 Ohm
Lead Channel Pacing Threshold Amplitude: 0.5 V
Lead Channel Pacing Threshold Amplitude: 1 V
Lead Channel Pacing Threshold Pulse Width: 0.5 ms
Lead Channel Pacing Threshold Pulse Width: 0.5 ms
Lead Channel Sensing Intrinsic Amplitude: 12 mV
Lead Channel Sensing Intrinsic Amplitude: 3.1 mV
Lead Channel Setting Pacing Amplitude: 0.75 V
Lead Channel Setting Pacing Amplitude: 2 V
Lead Channel Setting Pacing Pulse Width: 0.5 ms
Lead Channel Setting Sensing Sensitivity: 4 mV
Pulse Gen Model: 2272
Pulse Gen Serial Number: 3182792

## 2020-08-12 NOTE — Patient Instructions (Signed)
Medication Instructions:  *If you need a refill on your cardiac medications before your next appointment, please call your pharmacy*  Follow-Up: At [facility], you and your health needs are our priority.  As part of our continuing mission to provide you with exceptional heart care, we have created designated Provider Care Teams.  These Care Teams include your primary Cardiologist (physician) and Advanced Practice Providers (APPs -  Physician Assistants and Nurse Practitioners) who all work together to provide you with the care you need, when you need it.  We recommend signing up for the patient portal called "MyChart".  Sign up information is provided on this After Visit Summary.  MyChart is used to connect with patients for Virtual Visits (Telemedicine).  Patients are able to view lab/test results, encounter notes, upcoming appointments, etc.  Non-urgent messages can be sent to your provider as well.   To learn more about what you can do with MyChart, go to [email or web address].    Your next appointment:   Your physician wants you to follow-up in: 1 YEAR with Dr. Lovena Le. You will receive a reminder letter in the mail two months in advance. If you don't receive a letter, please call our office to schedule the follow-up appointment.  Remote monitoring is used to monitor your Pacemaker from home. This monitoring reduces the number of office visits required to check your device to one time per year. It allows [place] to keep an eye on the functioning of your device to ensure it is working properly. You are scheduled for a device check from home on 11/06/20. You may send your transmission at any time that day. If you have a wireless device, the transmission will be sent automatically. After your physician reviews your transmission, you will receive a postcard with your next transmission date.  The format for your next appointment:   In Person with Cristopher Peru, MD

## 2020-08-14 DIAGNOSIS — M13861 Other specified arthritis, right knee: Secondary | ICD-10-CM | POA: Diagnosis not present

## 2020-08-14 DIAGNOSIS — M13862 Other specified arthritis, left knee: Secondary | ICD-10-CM | POA: Diagnosis not present

## 2020-08-17 NOTE — Progress Notes (Signed)
Remote pacemaker transmission.   

## 2020-08-22 ENCOUNTER — Other Ambulatory Visit: Payer: Self-pay

## 2020-08-22 ENCOUNTER — Ambulatory Visit (INDEPENDENT_AMBULATORY_CARE_PROVIDER_SITE_OTHER): Payer: Medicare HMO | Admitting: *Deleted

## 2020-08-22 DIAGNOSIS — Z952 Presence of prosthetic heart valve: Secondary | ICD-10-CM | POA: Diagnosis not present

## 2020-08-22 DIAGNOSIS — Z09 Encounter for follow-up examination after completed treatment for conditions other than malignant neoplasm: Secondary | ICD-10-CM

## 2020-08-22 DIAGNOSIS — Z5181 Encounter for therapeutic drug level monitoring: Secondary | ICD-10-CM | POA: Diagnosis not present

## 2020-08-22 LAB — POCT INR: INR: 1.4 — AB (ref 2.0–3.0)

## 2020-08-22 NOTE — Patient Instructions (Signed)
Description    Take 1.5 tablets of Warfarin today and tomorrow, then continue taking 1 tablet daily except for 1/2 a tablet on Sunday and Tuesday. Be consistent with your 1 serving of greens per week. Recheck INR in 10 days. Call with any changes. # [phone].  Fax number: [phone]

## 2020-09-01 ENCOUNTER — Other Ambulatory Visit: Payer: Self-pay

## 2020-09-01 ENCOUNTER — Ambulatory Visit (INDEPENDENT_AMBULATORY_CARE_PROVIDER_SITE_OTHER): Payer: Medicare HMO

## 2020-09-01 DIAGNOSIS — Z09 Encounter for follow-up examination after completed treatment for conditions other than malignant neoplasm: Secondary | ICD-10-CM | POA: Diagnosis not present

## 2020-09-01 DIAGNOSIS — Z952 Presence of prosthetic heart valve: Secondary | ICD-10-CM

## 2020-09-01 DIAGNOSIS — Z5181 Encounter for therapeutic drug level monitoring: Secondary | ICD-10-CM

## 2020-09-01 LAB — POCT INR: INR: 2.1 (ref 2.0–3.0)

## 2020-09-01 NOTE — Patient Instructions (Signed)
Description   Start taking 1 tablet daily except for 1/2 tablet on Sundays. Be consistent with your 1 serving of greens per week. Recheck INR in 2 weeks. Call with any changes. # [phone].  Fax number: [phone]

## 2020-09-15 ENCOUNTER — Ambulatory Visit (INDEPENDENT_AMBULATORY_CARE_PROVIDER_SITE_OTHER): Payer: Medicare HMO | Admitting: *Deleted

## 2020-09-15 ENCOUNTER — Other Ambulatory Visit: Payer: Self-pay

## 2020-09-15 DIAGNOSIS — Z5181 Encounter for therapeutic drug level monitoring: Secondary | ICD-10-CM | POA: Diagnosis not present

## 2020-09-15 DIAGNOSIS — Z952 Presence of prosthetic heart valve: Secondary | ICD-10-CM

## 2020-09-15 DIAGNOSIS — Z09 Encounter for follow-up examination after completed treatment for conditions other than malignant neoplasm: Secondary | ICD-10-CM

## 2020-09-15 LAB — POCT INR: INR: 2.8 (ref 2.0–3.0)

## 2020-09-15 NOTE — Patient Instructions (Signed)
Description   Continue taking Warfarin 1 tablet daily except for 1/2 tablet on Sundays. Be consistent with your 1 serving of greens per week. Recheck INR in 3 weeks. Call with any changes. # [phone].  Fax number: [phone]

## 2020-09-25 DIAGNOSIS — G20 Parkinson's disease: Secondary | ICD-10-CM | POA: Diagnosis not present

## 2020-09-25 DIAGNOSIS — E1142 Type 2 diabetes mellitus with diabetic polyneuropathy: Secondary | ICD-10-CM | POA: Diagnosis not present

## 2020-09-25 DIAGNOSIS — G629 Polyneuropathy, unspecified: Secondary | ICD-10-CM | POA: Diagnosis not present

## 2020-10-06 ENCOUNTER — Ambulatory Visit (INDEPENDENT_AMBULATORY_CARE_PROVIDER_SITE_OTHER): Payer: Medicare HMO | Admitting: *Deleted

## 2020-10-06 ENCOUNTER — Other Ambulatory Visit: Payer: Self-pay

## 2020-10-06 DIAGNOSIS — Z09 Encounter for follow-up examination after completed treatment for conditions other than malignant neoplasm: Secondary | ICD-10-CM | POA: Diagnosis not present

## 2020-10-06 DIAGNOSIS — Z952 Presence of prosthetic heart valve: Secondary | ICD-10-CM

## 2020-10-06 DIAGNOSIS — Z5181 Encounter for therapeutic drug level monitoring: Secondary | ICD-10-CM

## 2020-10-06 LAB — POCT INR: INR: 2.4 (ref 2.0–3.0)

## 2020-10-06 NOTE — Patient Instructions (Signed)
Description   Continue taking Warfarin 1 tablet daily except for 1/2 tablet on Sundays. Be consistent with your 1 serving of greens per week. Recheck INR in 5 weeks. Call with any changes. # [phone].  Fax number: [phone]

## 2020-10-11 ENCOUNTER — Other Ambulatory Visit: Payer: Self-pay | Admitting: Adult Health

## 2020-10-14 ENCOUNTER — Other Ambulatory Visit: Payer: Self-pay | Admitting: Adult Health

## 2020-10-14 DIAGNOSIS — E119 Type 2 diabetes mellitus without complications: Secondary | ICD-10-CM

## 2020-10-17 ENCOUNTER — Telehealth: Payer: Self-pay | Admitting: Pharmacist

## 2020-10-20 ENCOUNTER — Ambulatory Visit (INDEPENDENT_AMBULATORY_CARE_PROVIDER_SITE_OTHER): Payer: Medicare HMO | Admitting: Pharmacist

## 2020-10-20 DIAGNOSIS — E119 Type 2 diabetes mellitus without complications: Secondary | ICD-10-CM

## 2020-10-20 DIAGNOSIS — E785 Hyperlipidemia, unspecified: Secondary | ICD-10-CM | POA: Diagnosis not present

## 2020-10-20 DIAGNOSIS — I10 Essential (primary) hypertension: Secondary | ICD-10-CM | POA: Diagnosis not present

## 2020-10-20 NOTE — Progress Notes (Signed)
Chronic Care Management Pharmacy Note  11/03/2020 Name:  [name] MRN:  [id number] DOB:  08-09-1945  Subjective: [name] is an 75 y.o. year old male who is a primary patient of Nafziger, Tommi Rumps, NP.  The CCM team was consulted for assistance with disease management and care coordination needs.    Engaged with patient by telephone for follow up visit in response to provider referral for pharmacy case management and/or care coordination services.   Consent to Services:  The patient was given information about Chronic Care Management services, agreed to services, and gave verbal consent prior to initiation of services.  Please see initial visit note for detailed documentation.   Patient Care Team: [name], NP as PCP - General (Family Medicine) Jerline Pain, MD as PCP - Cardiology (Cardiology) Evans Lance, MD as PCP - Electrophysiology (Cardiology) [name], MD as Consulting Physician (Orthopedic Surgery) [name], MD as Consulting Physician (Dermatology) Viona Gilmore, [facility] as Pharmacist (Pharmacist)  Recent office visits: 07/24/20 [name], NP: Patient presented for video visit for constipation and post COVID infection. Recommended taking benefiber or metamucil daily.   06/11/20 Ofilia Neas, LPN: Patient presented for medicare annual wellness exam.   01/09/20 [name], NP: Patient presented for annual exam.  No medication changes. Completed labs: A1C 6.5,  LDL 60, HDL 51.1, TGs 97.  Recent consult visits: 10/06/20 Johny Shock, RN (cardiology): Patient presented for anti-coag visit. INR 2.4, goal 2-3. Continued warfarin 5 mg (10 mg x 0.5) every Sun; 10 mg (10 mg x 1) all other days. Recheck in 5 weeks.  08/14/20 [name], MD (emergeortho): Unable to access notes.  08/12/20 Barrington Ellison, PA-C (cardiology): Patient presented for pacemaker follow up.   01/23/20 Melody Combs, RN (Neurology): Pt reduced dose of Sinemet to  25/100 mg to 1 tab TID and is feeling much better without any nausea and symptom control.   11/06/19 Griselda Miner (Dermatology): history of malignant neoplasm of skin, basal cell carcinoma of other parts of face diagnosis.  Hospital visits: None in previous 6 months  Objective:  Lab Results  Component Value Date   CREATININE 0.80 05/08/2020   BUN 14 05/08/2020   GFR 90.19 01/09/2020   GFRNONAA 87 05/08/2020   GFRAA 101 05/08/2020   NA 142 05/08/2020   K 4.2 05/08/2020   CALCIUM 9.3 05/08/2020   CO2 25 05/08/2020   GLUCOSE 114 (H) 05/08/2020    Lab Results  Component Value Date/Time   HGBA1C 6.1 (A) 06/23/2020 01:43 PM   HGBA1C 6.2 01/09/2020 10:42 AM   HGBA1C 5.8 10/16/2019 01:10 PM   HGBA1C 6.3 04/09/2019 10:06 AM   HGBA1C 6.0 09/28/2018 08:22 AM   GFR 90.19 01/09/2020 10:42 AM   GFR 89.20 12/28/2018 10:04 AM   MICROALBUR 0.8 10/11/2017 09:05 AM   MICROALBUR <0.7 10/11/2016 01:27 PM    Last diabetic Eye exam:  Lab Results  Component Value Date/Time   HMDIABEYEEXA No Retinopathy 06/25/2016 12:00 AM    Last diabetic Foot exam: No results found for: HMDIABFOOTEX   Lab Results  Component Value Date   CHOL 130 01/09/2020   HDL 51.10 01/09/2020   LDLCALC 60 01/09/2020   TRIG 97.0 01/09/2020   CHOLHDL 3 01/09/2020    Hepatic Function Latest Ref Rng & Units 01/09/2020 12/28/2018 03/09/2018  Total Protein 6.0 - 8.3 g/dL 6.2 5.9(L) 6.5  Albumin 3.5 - 5.2 g/dL 4.4 4.1 4.1  AST 0 - 37 U/L 20 24 28  ALT 0 - 53 U/L 20 19 24   Alk Phosphatase 39 - 117 U/L 49 51 41  Total Bilirubin 0.2 - 1.2 mg/dL 0.7 0.5 0.8  Bilirubin, Direct 0.0 - 0.3 mg/dL - - -    Lab Results  Component Value Date/Time   TSH 1.72 01/09/2020 10:42 AM   TSH 1.47 12/28/2018 10:04 AM    CBC Latest Ref Rng & Units 01/09/2020 12/28/2018 03/09/2018  WBC 4.0 - 10.5 K/uL 7.5 8.7 7.7  Hemoglobin 13.0 - 17.0 g/dL 13.9 13.9 14.4  Hematocrit 39.0 - 52.0 % 41.9 42.3 43.8  Platelets 150.0 - 400.0 K/uL 197.0  213.0 198    No results found for: VD25OH  Clinical ASCVD: Yes  The 10-year ASCVD risk score Mikey Bussing DC Jr., et al., 2013) is: 50.5%   Values used to calculate the score:     Age: 11 years     Sex: Male     Is Non-Hispanic African American: No     Diabetic: Yes     Tobacco smoker: No     Systolic Blood Pressure: 035 mmHg     Is BP treated: Yes     HDL Cholesterol: 51.1 mg/dL     Total Cholesterol: 130 mg/dL    Depression screen [facility] 2/9 06/11/2020 01/09/2020 10/11/2016  Decreased Interest 0 0 0  Down, Depressed, Hopeless 0 0 0  PHQ - 2 Score 0 0 0  Altered sleeping 0 - -  Tired, decreased energy 0 - -  Change in appetite 0 - -  Feeling bad or failure about yourself  0 - -  Trouble concentrating 0 - -  Moving slowly or fidgety/restless 0 - -  Suicidal thoughts 0 - -  PHQ-9 Score 0 - -  Difficult doing work/chores Not difficult at all - -  Some recent data might be hidden      Social History   Tobacco Use  Smoking Status Former Smoker  . Quit date: 07/12/1973  . Years since quitting: 47.3  Smokeless Tobacco Never Used   BP Readings from Last 3 Encounters:  08/12/20 140/80  06/23/20 128/68  06/11/20 110/64   Pulse Readings from Last 3 Encounters:  08/12/20 80  06/23/20 84  06/11/20 85   Wt Readings from Last 3 Encounters:  08/12/20 201 lb (91.2 kg)  07/24/20 194 lb (88 kg)  06/23/20 205 lb 12.8 oz (93.4 kg)   BMI Readings from Last 3 Encounters:  08/12/20 31.96 kg/m  07/24/20 31.31 kg/m  06/23/20 33.22 kg/m    Assessment/Interventions: Review of patient past medical history, allergies, medications, health status, including review of consultants reports, laboratory and other test data, was performed as part of comprehensive evaluation and provision of chronic care management services.   SDOH:  (Social Determinants of Health) assessments and interventions performed: No  SDOH Screenings   Alcohol Screen: Low Risk   . Last Alcohol Screening Score (AUDIT): 0   Depression (PHQ2-9): Low Risk   . PHQ-2 Score: 0  Financial Resource Strain: Low Risk   . Difficulty of Paying Living Expenses: Not hard at all  Food Insecurity: No Food Insecurity  . Worried About Charity fundraiser in the Last Year: Never true  . Ran Out of Food in the Last Year: Never true  Housing: Low Risk   . Last Housing Risk Score: 0  Physical Activity: Sufficiently Active  . Days of Exercise per Week: 7 days  . Minutes of Exercise per Session: 30 min  Social Connections:  Moderately Integrated  . Frequency of Communication with Friends and Family: More than three times a week  . Frequency of Social Gatherings with Friends and Family: More than three times a week  . Attends Religious Services: More than 4 times per year  . Active Member of Clubs or Organizations: No  . Attends Archivist Meetings: Never  . Marital Status: Married  Stress: No Stress Concern Present  . Feeling of Stress : Not at all  Tobacco Use: Medium Risk  . Smoking Tobacco Use: Former Smoker  . Smokeless Tobacco Use: Never Used  Transportation Needs: No Transportation Needs  . Lack of Transportation (Medical): No  . Lack of Transportation (Non-Medical): No    CCM Care Plan  Allergies  Allergen Reactions  . Bee Venom Shortness Of Breath and Swelling  . Codeine Phosphate Hives, Shortness Of Breath, Itching, Rash and Other (See Comments)    Tolerated multiple doses of IV morphine  . Metoprolol Tartrate Other (See Comments)    loss of vision. Can take the XL tablet not regular   . Pramipexole Other (See Comments)    Hallucinations    . Tramadol     Pt has hallucinations   . Ramipril Rash and Cough    Medications Reviewed Today    Reviewed by Jeremy Johann, CMA (Certified Medical Assistant) on 08/12/20 at [place] List Status: <None>  Medication Order Taking? Sig Documenting Provider Last Dose Status Informant  Accu-Chek FastClix Lancets MISC [id number] Yes USE TO CHECK BLOOD  SUGAR TWICE A DAY AND AS NEEDED Nafziger, Tommi Rumps, NP Taking Active   ACCU-CHEK SMARTVIEW test strip [id number] Yes TEST BLOOD SUGAR TWICE DAILY AND AS NEEDED Nafziger, Tommi Rumps, NP Taking Active   acetaminophen (TYLENOL) 650 MG CR tablet [id number] Yes Take 1,300 mg by mouth 2 (two) times daily. [provider] Taking Active Spouse/Significant Other  albuterol (VENTOLIN HFA) 108 (90 Base) MCG/ACT inhaler [id number] Yes Inhale 2 puffs into the lungs every 6 (six) hours as needed for wheezing or shortness of breath. [name], NP Taking Active   Ascorbic Acid (VITAMIN C) 1000 MG tablet [id number] Yes Take 1,000 mg by mouth daily. [provider] Taking Active Spouse/Significant Other  aspirin 81 MG tablet [id number] Yes Take 81 mg by mouth every evening.  [provider] Taking Active Spouse/Significant Other  atorvastatin (LIPITOR) 40 MG tablet [id number] Yes TAKE 1 TABLET EVERY DAY AT 6 PM. Evans Lance, MD Taking Active   calcium carbonate (OS-CAL) 600 MG TABS [id number] Yes Take 600 mg by mouth 2 (two) times daily with a meal. [provider] Taking Active Spouse/Significant Other  carbidopa-levodopa (SINEMET IR) 25-100 MG tablet [id number] Yes Take 1 tablet by mouth 3 (three) times daily.  [provider] Taking Active   cetirizine (ZYRTEC) 10 MG tablet [id number] Yes Take 10 mg by mouth daily.  [provider] Taking Active Spouse/Significant Other  Cholecalciferol (VITAMIN D3) 125 MCG (5000 UT) TABS [id number] Yes Take 1 tablet by mouth daily. [provider] Taking Active   fluticasone (FLONASE) 50 MCG/ACT nasal spray [id number] Yes Place 2 sprays into both nostrils daily. Nafziger, Tommi Rumps, NP Taking Active   furosemide (LASIX) 40 MG tablet [id number] Yes Take 40 mg by mouth daily. [provider] Taking Active   guaiFENesin-dextromethorphan (ROBITUSSIN DM) 100-10 MG/5ML syrup [id number] Yes Take 5 mLs by mouth every 4 (four) hours as needed  for cough. [provider] Taking Active Spouse/Significant Other  losartan (COZAAR) 100 MG  tablet [id number] Yes TAKE 1 TABLET EVERY DAY (NEED MD APPOINTMENT FOR REFILLS) Evans Lance, MD Taking Active   melatonin 5 MG TABS [id number] Yes Take 5 mg by mouth at bedtime. [provider] Taking Active   metFORMIN (GLUCOPHAGE) 500 MG tablet [id number] Yes Take 1 tablet (500 mg total) by mouth 2 (two) times daily with a meal. Nafziger, Tommi Rumps, NP Taking Active   metoprolol succinate (TOPROL-XL) 100 MG 24 hr tablet [id number] Yes TAKE 1 TABLET EVERY DAY Nafziger, Tommi Rumps, NP Taking Active   Misc Natural Products (TART CHERRY ADVANCED) CAPS [id number] Yes Take 2 capsules by mouth daily.  [provider] Taking Active Spouse/Significant Other  Multiple Vitamin (MULTIVITAMIN) tablet [id number] Yes Take 1 tablet by mouth daily.   [provider] Taking Active Spouse/Significant Other  nitroGLYCERIN (NITROSTAT) 0.4 MG SL tablet [id number] Yes Place 1 tablet (0.4 mg total) under the tongue every 5 (five) minutes as needed. For chest pain Tommie Raymond, NP Taking Active   Omega-3 Fatty Acids (FISH OIL) 1200 MG CAPS [id number] Yes Take 1,200 mg by mouth daily.  [provider] Taking Active Spouse/Significant Other  potassium chloride (KLOR-CON) 10 MEQ tablet [id number] Yes Take 10 mEq by mouth daily. [provider] Taking Active   Probiotic Product ([place]) [id number] Yes Take 1 capsule by mouth daily. [provider] Taking Active Spouse/Significant Other  vitamin B-12 (CYANOCOBALAMIN) 1000 MCG tablet [id number] Yes Take 1,000 mcg by mouth daily. [provider] Taking Active Spouse/Significant Other  warfarin (COUMADIN) 10 MG tablet [id number] Yes TAKE 1 TABLET DAILY OR AS DIRECTED BY COUMADIN CLINIC Jerline Pain, MD Taking Active           Patient Active Problem List   Diagnosis Date Noted  . Chronic diastolic heart failure  ([place]) 06/22/2019  . Osteoarthritis of both knees 07/21/2018  . Spinal stenosis, lumbar region with neurogenic claudication 03/15/2018  . 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 Diabetes mellitus without complication ([place]) 14/48/1856  . Encounter for therapeutic drug monitoring 08/08/2013  . Aortic valve replaced 02/21/2013  . Mitral regurgitation 01/21/2013  . Recurrent right inguinal hernia 01/02/2013  . Left inguinal hernia 01/02/2013  . Carotid artery disease ([place])   . Warfarin anticoagulation   . Hx of CABG   . Aortic stenosis 08/25/2010  . AORTIC VALVE REPLACEMENT, HX OF 05/23/2009  . AV BLOCK, COMPLETE 11/01/2008  . PACEMAKER, PERMANENT 11/01/2008  . GOUT 01/16/2007  . CAD (coronary artery disease) 01/16/2007  . Dyslipidemia 01/16/2007  . Essential hypertension 01/16/2007    Immunization History  Administered Date(s) Administered  . Fluad Quad(high Dose 65+) 03/22/2019, 04/01/2020  . Influenza Split 03/31/2011, 04/10/2012  . Influenza Whole 04/04/2008, 05/09/2009, 03/18/2010  . Influenza, High Dose Seasonal PF 04/28/2015, 04/06/2016, 04/12/2017, 03/30/2018  . Influenza,inj,Quad PF,6+ Mos 04/02/2013  . Influenza-Unspecified 04/05/2014  . Pneumococcal Conjugate-13 02/13/2014  . Pneumococcal Polysaccharide-23 10/07/2011  . Tdap 10/07/2011, 07/16/2012  . Zoster 10/07/2011  . Zoster Recombinat (Shingrix) 10/11/2017, 12/12/2017   Pain in heel and has bone spurs - [facility]  Conditions to be addressed/monitored:  Hypertension, Hyperlipidemia, Diabetes, Heart Failure, Coronary Artery Disease, Asthma, Osteoarthritis, Gout, Allergic Rhinitis and Aortic valve replacement and Parkinson's disease  Care Plan : [place]  Updates made by Viona Gilmore, [place] since 11/03/2020 12:00 AM    Problem: Problem: Hypertension, Hyperlipidemia, Diabetes, Heart Failure, Coronary Artery Disease, Asthma, Osteoarthritis, Gout,  Allergic Rhinitis and Aortic valve  replacement and Parkinson's disease     Long-Range Goal: Patient-Specific Goal   Start Date: 10/20/2020  Expected End Date: 10/20/2021  This Visit's Progress: On track  Priority: High  Note:   Current Barriers:  . Unable to independently monitor therapeutic efficacy  Pharmacist Clinical Goal(s):  Marland Kitchen Patient will achieve adherence to monitoring guidelines and medication adherence to achieve therapeutic efficacy through collaboration with PharmD and provider.   Interventions: . 1:1 collaboration with [name], NP regarding development and update of comprehensive plan of care as evidenced by provider attestation and co-signature . Inter-disciplinary care team collaboration (see longitudinal plan of care) . Comprehensive medication review performed; medication list updated in electronic medical record  Hypertension (BP goal <130/80) -Not ideally controlled -Current treatment:  Losartan 100 mg daily - in PM  Metoprolol succinate 100 mg daily - in AM -Medications previously tried: ramipril (rash, cough) -Current home readings: 125-130/70s -Current dietary habits: did not discuss -Current exercise habits: walking dog about 6-7 times a day -Reports hypotensive/hypertensive symptoms -Educated on Daily salt intake goal < 2300 mg; Importance of home blood pressure monitoring; Proper BP monitoring technique; -Counseled to monitor BP at home at least weekly, document, and provide log at future appointments -Counseled on diet and exercise extensively Recommended to continue current medication  Hyperlipidemia: (LDL goal < 70) -Controlled -Current treatment:  Atorvastatin 40 mg daily at 6PM (not filled since 05/28/20 x 90 ds)  Fish oil 1,200 mg daily  -Medications previously tried: pravastatin (switched to high intensity atorvastatin), rosuvastatin (cost) -Current dietary patterns: eating more chicken and fish; one serving of greens per week; no  fried foods -Current exercise habits: walking dog -Educated on Cholesterol goals;  Benefits of statin for ASCVD risk reduction; -Counseled on diet and exercise extensively Recommended to continue current medication Recommended stopping fish oil given lower than effective dose and risk for bleeding  CAD (Goal: prevent heart attacks and strokes) -Controlled -Current treatment   Nitroglycerin 0.4 mg as needed for chest pain  Atorvastatin 40 mg daily  Aspirin 81 mg daily -Medications previously tried: none  -Recommended to continue current medication  Aortic valve replacement (Goal: prevent clot formation) -Controlled -Current treatment  . Warfarin 5 mg every Sun and 10 mg all other days -Medications previously tried: none  -Counseled on monitoring for signs of bleeding such as unexplained and excessive bleeding from a cut or injury, easy or excessive bruising, blood in urine or stools, and nosebleeds without a known cause   Diabetes (A1c goal <7%) -Controlled -Current medications: rrent medications:  Metformin 500 mg twice daily -Medications previously tried: glipizide (weakness/shakiness with borderline hypoglycemia) -Current home glucose readings: checks twice weekly . fasting glucose: 81, 80-103 (as high as it gets) . post prandial glucose: none -Denies hypoglycemic/hyperglycemic symptoms -Current meal patterns:  . breakfast: one slice of bread . lunch: did not discuss  . dinner: did not discuss  . snacks: not a lot of sweets . drinks: water, occasional tea or decaf coffee with artificial sweetener -Current exercise: takes dog out about 6-7 times a day and walks the yard every time -Educated on Exercise goal of 150 minutes per week; Carbohydrate counting and/or plate method -Counseled to check feet daily and get yearly eye exams -Counseled on diet and exercise extensively Recommended to continue current medication  Heart Failure (Goal: manage symptoms and prevent  exacerbations) -Controlled -Last ejection fraction: 55%  -HF type: Diastolic -NYHA Class: II (slight limitation of activity) -AHA HF Stage: B (Heart disease present - no  symptoms present) -Current treatment:  Furosemide 40 mg once daily  Metoprolol succinate 100 mg daily   Potassium chloride 10 MEQ once daily  -Medications previously tried: none  -Current home BP/HR readings: 120-130s -Current dietary habits: limits salt intake -Current exercise habits: walks dog daily -Educated on Importance of weighing daily; if you gain more than 3 pounds in one day or 5 pounds in one week, call cardiologist Importance of blood pressure control -Counseled on diet and exercise extensively Recommended to continue current medication  Parkinson's disease (Goal: minimize symptoms) -Controlled -Current treatment   Sinemet 25/100 mg, 1 tablet three times daily  -Medications previously tried: Sinemet 4 times daily (hallucinations and GI upset), Requip (ankle swelling), Mirapex (hallucinations) -Recommended to continue current medication   Asthma (Goal: control symptoms) -Controlled -Current treatment  . Albuterol two puffs every 6 hours as needed for wheezing or shortness of breath -Medications previously tried: none  -Pulmonary function testing: none -Patient denies consistent use of maintenance inhaler -Frequency of rescue inhaler use: 4-5 times a week and 1 puff before bedtime -Counseled on When to use rescue inhaler -Recommended to continue current medication  Allergic rhinitis (Goal: minimize symptoms) -Controlled -Current treatment   Cetirizine 10 mg daily (morning)   Flonase 2 sprays into both nostrils daily -Medications previously tried: none  -Recommended to continue current medication  Gout (Goal: prevent flare ups) -Controlled -Current treatment  . Tart cherry two tablets once daily -Medications previously tried: none  -Recommended to continue current  medication  Osteoarthritis (Goal: minimize pain) -Controlled -Current treatment  . Acetaminophen 652m, 2 tablets twice daily -Medications previously tried: none  -Recommended to continue current medication   Health Maintenance -Vaccine gaps: COVID vaccines (patient declined vaccine) -Current therapy:   Vitamin C 1000 mg once daily   Vitamin B12 1000 mg daily (morning)   Phillips colon health daily (breakfast)  Multivitamin daily (morning)   Calcium carbonate 600 mg twice daily with meals  Vitamin D 5000 units, 1 tablet once daily  -Educated on Cost vs benefit of each product must be carefully weighed by individual consumer -Patient is satisfied with current therapy and denies issues -Recommended to continue current medication Recommended vitamin D level with high dose of supplementation  Patient Goals/Self-Care Activities . Patient will:  - take medications as prescribed check blood pressure weekly, document, and provide at future appointments weigh daily, and contact provider if weight gain of > 3 lbs in one day or > 5 lbs in one week  Follow Up Plan: Telephone follow up appointment with care management team member scheduled for: 6 months       Medication Assistance: None required.  Patient affirms current coverage meets needs.  [facility] [facility] [place] [facility]  [facility] [address] [id number]: [phone]: [phone] [facility] [place]: [phone]: [phone] Uses pill box? Yes - spouse organizes weekly pill box (AM and PM) and organizes Sinemet separately Pt endorses 100% compliance  We discussed: Current pharmacy is preferred with insurance plan and patient is satisfied with pharmacy services Patient decided to: Continue current medication management strategy  Care  Plan and Follow Up Patient Decision:  Patient agrees to Care Plan and Follow-up.  Plan: Telephone follow up appointment with care management team member scheduled for:  6 months  MJeni Salles PharmD BEdinburg Regional Medical CenterClinical Pharmacist  Therapist, music at [place]

## 2020-10-22 DIAGNOSIS — M79672 Pain in left foot: Secondary | ICD-10-CM | POA: Diagnosis not present

## 2020-11-03 NOTE — Patient Instructions (Signed)
Hi Veniamin,  It was great to get to speak with you over the telephone again! Below is a summary of some of the topics we discussed.   Please reach out to me if you have any questions or need anything before our follow up!  Best, Maddie  [name], PharmD, [facility]  Visit Information  Goals Addressed   None    Patient Care Plan: CCM Pharmacy Care Plan    Problem Identified: Problem: Hypertension, Hyperlipidemia, Diabetes, Heart Failure, Coronary Artery Disease, Asthma, Osteoarthritis, Gout, Allergic Rhinitis and Aortic valve replacement and Parkinson's disease     Long-Range Goal: Patient-Specific Goal   Start Date: 10/20/2020  Expected End Date: 10/20/2021  This Visit's Progress: On track  Priority: High  Note:   Current Barriers:  . Unable to independently monitor therapeutic efficacy  Pharmacist Clinical Goal(s):  Marland Kitchen Patient will achieve adherence to monitoring guidelines and medication adherence to achieve therapeutic efficacy through collaboration with PharmD and provider.   Interventions: . 1:1 collaboration with [name], NP regarding development and update of comprehensive plan of care as evidenced by provider attestation and co-signature . Inter-disciplinary care team collaboration (see longitudinal plan of care) . Comprehensive medication review performed; medication list updated in electronic medical record  Hypertension (BP goal <130/80) -Not ideally controlled -Current treatment:  Losartan 100 mg daily - in PM  Metoprolol succinate 100 mg daily - in AM -Medications previously tried: ramipril (rash, cough) -Current home readings: 125-130/70s -Current dietary habits: did not discuss -Current exercise habits: walking dog about 6-7 times a day -Reports hypotensive/hypertensive symptoms -Educated on Daily salt intake goal < 2300 mg; Importance of home blood pressure monitoring; Proper BP  monitoring technique; -Counseled to monitor BP at home at least weekly, document, and provide log at future appointments -Counseled on diet and exercise extensively Recommended to continue current medication  Hyperlipidemia: (LDL goal < 70) -Controlled -Current treatment:  Atorvastatin 40 mg daily at 6PM (not filled since 05/28/20 x 90 ds)  Fish oil 1,200 mg daily  -Medications previously tried: pravastatin (switched to high intensity atorvastatin), rosuvastatin (cost) -Current dietary patterns: eating more chicken and fish; one serving of greens per week; no fried foods -Current exercise habits: walking dog -Educated on Cholesterol goals;  Benefits of statin for ASCVD risk reduction; -Counseled on diet and exercise extensively Recommended to continue current medication Recommended stopping fish oil given lower than effective dose and risk for bleeding  CAD (Goal: prevent heart attacks and strokes) -Controlled -Current treatment   Nitroglycerin 0.4 mg as needed for chest pain  Atorvastatin 40 mg daily  Aspirin 81 mg daily -Medications previously tried: none  -Recommended to continue current medication  Aortic valve replacement (Goal: prevent clot formation) -Controlled -Current treatment  . Warfarin 5 mg every Sun and 10 mg all other days -Medications previously tried: none  -Counseled on monitoring for signs of bleeding such as unexplained and excessive bleeding from a cut or injury, easy or excessive bruising, blood in urine or stools, and nosebleeds without a known cause   Diabetes (A1c goal <7%) -Controlled -Current medications: rrent medications:  Metformin 500 mg twice daily -Medications previously tried: glipizide (weakness/shakiness with borderline hypoglycemia) -Current home glucose readings: checks twice weekly . fasting glucose: 81, 80-103 (as high as it gets) . post prandial glucose: none -Denies hypoglycemic/hyperglycemic symptoms -Current meal  patterns:  . breakfast: one slice of bread . lunch: did not discuss  . dinner: did not discuss  .  snacks: not a lot of sweets . drinks: water, occasional tea or decaf coffee with artificial sweetener -Current exercise: takes dog out about 6-7 times a day and walks the yard every time -Educated on Exercise goal of 150 minutes per week; Carbohydrate counting and/or plate method -Counseled to check feet daily and get yearly eye exams -Counseled on diet and exercise extensively Recommended to continue current medication  Heart Failure (Goal: manage symptoms and prevent exacerbations) -Controlled -Last ejection fraction: 55%  -HF type: Diastolic -NYHA Class: II (slight limitation of activity) -AHA HF Stage: B (Heart disease present - no symptoms present) -Current treatment:  Furosemide 40 mg once daily  Metoprolol succinate 100 mg daily   Potassium chloride 10 MEQ once daily  -Medications previously tried: none  -Current home BP/HR readings: 120-130s -Current dietary habits: limits salt intake -Current exercise habits: walks dog daily -Educated on Importance of weighing daily; if you gain more than 3 pounds in one day or 5 pounds in one week, call cardiologist Importance of blood pressure control -Counseled on diet and exercise extensively Recommended to continue current medication  Parkinson's disease (Goal: minimize symptoms) -Controlled -Current treatment   Sinemet 25/100 mg, 1 tablet three times daily  -Medications previously tried: Sinemet 4 times daily (hallucinations and GI upset), Requip (ankle swelling), Mirapex (hallucinations) -Recommended to continue current medication   Asthma (Goal: control symptoms) -Controlled -Current treatment  . Albuterol two puffs every 6 hours as needed for wheezing or shortness of breath -Medications previously tried: none  -Pulmonary function testing: none -Patient denies consistent use of maintenance inhaler -Frequency of rescue  inhaler use: 4-5 times a week and 1 puff before bedtime -Counseled on When to use rescue inhaler -Recommended to continue current medication  Allergic rhinitis (Goal: minimize symptoms) -Controlled -Current treatment   Cetirizine 10 mg daily (morning)   Flonase 2 sprays into both nostrils daily -Medications previously tried: none  -Recommended to continue current medication  Gout (Goal: prevent flare ups) -Controlled -Current treatment  . Tart cherry two tablets once daily -Medications previously tried: none  -Recommended to continue current medication  Osteoarthritis (Goal: minimize pain) -Controlled -Current treatment  . Acetaminophen 650mg , 2 tablets twice daily -Medications previously tried: none  -Recommended to continue current medication   Health Maintenance -Vaccine gaps: COVID vaccines (patient declined vaccine) -Current therapy:   Vitamin C 1000 mg once daily   Vitamin B12 1000 mg daily (morning)   Phillips colon health daily (breakfast)  Multivitamin daily (morning)   Calcium carbonate 600 mg twice daily with meals  Vitamin D 5000 units, 1 tablet once daily  -Educated on Cost vs benefit of each product must be carefully weighed by individual consumer -Patient is satisfied with current therapy and denies issues -Recommended to continue current medication Recommended vitamin D level with high dose of supplementation  Patient Goals/Self-Care Activities . Patient will:  - take medications as prescribed check blood pressure weekly, document, and provide at future appointments weigh daily, and contact provider if weight gain of > 3 lbs in one day or > 5 lbs in one week  Follow Up Plan: Telephone follow up appointment with care management team member scheduled for: 6 months       Patient verbalizes understanding of instructions provided today and agrees to view in [place].  Telephone follow up appointment with pharmacy team member scheduled  for:  Viona Gilmore, [facility]

## 2020-11-06 ENCOUNTER — Ambulatory Visit (INDEPENDENT_AMBULATORY_CARE_PROVIDER_SITE_OTHER): Payer: Medicare HMO

## 2020-11-06 DIAGNOSIS — I442 Atrioventricular block, complete: Secondary | ICD-10-CM

## 2020-11-06 LAB — CUP PACEART REMOTE DEVICE CHECK
Battery Remaining Longevity: 126 mo
Battery Remaining Percentage: 95.5 %
Battery Voltage: 2.98 V
Brady Statistic AP VP Percent: 30 %
Brady Statistic AP VS Percent: 1 %
Brady Statistic AS VP Percent: 70 %
Brady Statistic AS VS Percent: 1 %
Brady Statistic RA Percent Paced: 29 %
Brady Statistic RV Percent Paced: 99 %
Date Time Interrogation Session: 20220428020017
Implantable Lead Implant Date: 20050816
Implantable Lead Implant Date: 20050816
Implantable Lead Location: 753859
Implantable Lead Location: 753860
Implantable Pulse Generator Implant Date: 20171026
Lead Channel Impedance Value: 460 Ohm
Lead Channel Impedance Value: 680 Ohm
Lead Channel Pacing Threshold Amplitude: 0.5 V
Lead Channel Pacing Threshold Amplitude: 0.875 V
Lead Channel Pacing Threshold Pulse Width: 0.5 ms
Lead Channel Pacing Threshold Pulse Width: 0.5 ms
Lead Channel Sensing Intrinsic Amplitude: 12 mV
Lead Channel Sensing Intrinsic Amplitude: 3 mV
Lead Channel Setting Pacing Amplitude: 0.75 V
Lead Channel Setting Pacing Amplitude: 1.875
Lead Channel Setting Pacing Pulse Width: 0.5 ms
Lead Channel Setting Sensing Sensitivity: 4 mV
Pulse Gen Model: 2272
Pulse Gen Serial Number: 3182792

## 2020-11-10 ENCOUNTER — Ambulatory Visit (INDEPENDENT_AMBULATORY_CARE_PROVIDER_SITE_OTHER): Payer: Medicare HMO | Admitting: *Deleted

## 2020-11-10 ENCOUNTER — Other Ambulatory Visit: Payer: Self-pay

## 2020-11-10 DIAGNOSIS — Z09 Encounter for follow-up examination after completed treatment for conditions other than malignant neoplasm: Secondary | ICD-10-CM

## 2020-11-10 DIAGNOSIS — Z5181 Encounter for therapeutic drug level monitoring: Secondary | ICD-10-CM

## 2020-11-10 DIAGNOSIS — Z952 Presence of prosthetic heart valve: Secondary | ICD-10-CM

## 2020-11-10 LAB — POCT INR: INR: 2.9 (ref 2.0–3.0)

## 2020-11-10 NOTE — Patient Instructions (Signed)
Description   Continue taking Warfarin 1 tablet daily except for 1/2 tablet on Sundays. Be consistent with your 1 serving of greens per week. Recheck INR in 6 weeks. Call with any changes. # [phone].  Fax number: [phone]

## 2020-11-10 NOTE — Chronic Care Management (AMB) (Signed)
I left the patient a message about his/her upcoming appointment on 04/11/20222 @ 1:00 pm with the clinical pharmacist. He was asked to please have all medication on hand to review with the pharmacist.   Neita Goodnight) Mare Ferrari, [place] Pharmacist Assistant [phone]

## 2020-11-21 DIAGNOSIS — C44519 Basal cell carcinoma of skin of other part of trunk: Secondary | ICD-10-CM | POA: Diagnosis not present

## 2020-11-21 DIAGNOSIS — D1801 Hemangioma of skin and subcutaneous tissue: Secondary | ICD-10-CM | POA: Diagnosis not present

## 2020-11-21 DIAGNOSIS — M25562 Pain in left knee: Secondary | ICD-10-CM | POA: Diagnosis not present

## 2020-11-21 DIAGNOSIS — Z85828 Personal history of other malignant neoplasm of skin: Secondary | ICD-10-CM | POA: Diagnosis not present

## 2020-11-21 DIAGNOSIS — D225 Melanocytic nevi of trunk: Secondary | ICD-10-CM | POA: Diagnosis not present

## 2020-11-21 DIAGNOSIS — M25561 Pain in right knee: Secondary | ICD-10-CM | POA: Diagnosis not present

## 2020-11-21 DIAGNOSIS — L814 Other melanin hyperpigmentation: Secondary | ICD-10-CM | POA: Diagnosis not present

## 2020-11-21 DIAGNOSIS — D692 Other nonthrombocytopenic purpura: Secondary | ICD-10-CM | POA: Diagnosis not present

## 2020-11-21 DIAGNOSIS — L570 Actinic keratosis: Secondary | ICD-10-CM | POA: Diagnosis not present

## 2020-11-22 DIAGNOSIS — M25562 Pain in left knee: Secondary | ICD-10-CM | POA: Diagnosis not present

## 2020-11-22 DIAGNOSIS — M25561 Pain in right knee: Secondary | ICD-10-CM | POA: Diagnosis not present

## 2020-11-25 ENCOUNTER — Encounter: Payer: Self-pay | Admitting: Adult Health

## 2020-11-25 MED ORDER — ALBUTEROL SULFATE HFA 108 (90 BASE) MCG/ACT IN AERS: 2.0000 | INHALATION_SPRAY | Freq: Four times a day (QID) | RESPIRATORY_TRACT | 0 refills | Status: DC | PRN

## 2020-11-26 NOTE — Progress Notes (Signed)
Remote pacemaker transmission.   

## 2020-12-22 ENCOUNTER — Ambulatory Visit (INDEPENDENT_AMBULATORY_CARE_PROVIDER_SITE_OTHER): Payer: Medicare HMO | Admitting: *Deleted

## 2020-12-22 ENCOUNTER — Other Ambulatory Visit: Payer: Self-pay

## 2020-12-22 DIAGNOSIS — Z5181 Encounter for therapeutic drug level monitoring: Secondary | ICD-10-CM

## 2020-12-22 DIAGNOSIS — Z952 Presence of prosthetic heart valve: Secondary | ICD-10-CM

## 2020-12-22 DIAGNOSIS — Z09 Encounter for follow-up examination after completed treatment for conditions other than malignant neoplasm: Secondary | ICD-10-CM

## 2020-12-22 LAB — POCT INR: INR: 4 — AB (ref 2.0–3.0)

## 2020-12-22 NOTE — Patient Instructions (Addendum)
Description   Do not take any Warfarin today then continue taking Warfarin 1 tablet daily except for 1/2 tablet on Sundays. Be consistent with your 1 serving of greens per week. Recheck INR in 2 weeks (normally 6 weeks). Call with any changes. # [phone].  Fax number: [phone]

## 2021-01-06 ENCOUNTER — Other Ambulatory Visit: Payer: Self-pay

## 2021-01-07 ENCOUNTER — Ambulatory Visit (INDEPENDENT_AMBULATORY_CARE_PROVIDER_SITE_OTHER): Payer: Medicare HMO | Admitting: Adult Health

## 2021-01-07 ENCOUNTER — Ambulatory Visit (INDEPENDENT_AMBULATORY_CARE_PROVIDER_SITE_OTHER): Payer: Medicare HMO | Admitting: *Deleted

## 2021-01-07 ENCOUNTER — Encounter: Payer: Self-pay | Admitting: Adult Health

## 2021-01-07 VITALS — BP 110/80 | HR 72 | Temp 98.7°F | Ht 65.0 in | Wt 201.0 lb

## 2021-01-07 DIAGNOSIS — Z952 Presence of prosthetic heart valve: Secondary | ICD-10-CM | POA: Diagnosis not present

## 2021-01-07 DIAGNOSIS — Z5181 Encounter for therapeutic drug level monitoring: Secondary | ICD-10-CM

## 2021-01-07 DIAGNOSIS — Z09 Encounter for follow-up examination after completed treatment for conditions other than malignant neoplasm: Secondary | ICD-10-CM

## 2021-01-07 DIAGNOSIS — R0781 Pleurodynia: Secondary | ICD-10-CM | POA: Diagnosis not present

## 2021-01-07 DIAGNOSIS — E119 Type 2 diabetes mellitus without complications: Secondary | ICD-10-CM

## 2021-01-07 DIAGNOSIS — I10 Essential (primary) hypertension: Secondary | ICD-10-CM

## 2021-01-07 LAB — POCT GLYCOSYLATED HEMOGLOBIN (HGB A1C): Hemoglobin A1C: 5.9 % — AB (ref 4.0–5.6)

## 2021-01-07 LAB — POCT INR: INR: 3 (ref 2.0–3.0)

## 2021-01-07 NOTE — Patient Instructions (Signed)
Your A1c dropped from 6.1 to 5.9. I am going to have you decrease your metformin to once daily in the morning   Lets follow up in 3 months for your physical exam

## 2021-01-07 NOTE — Progress Notes (Signed)
Subjective:    Patient ID: [name], male    DOB: 1945/08/27, 75 y.o.   MRN: [id number]  HPI  Patient presents for follow up regarding DM and HTN.   DM II -controlled with metformin 500 mg twice daily.  He does check his blood sugars at home periodically with readings in the 120s or below this.  He denies symptoms of hypoglycemia. This morning his blood sugar 89.  Lab Results  Component Value Date   HGBA1C 6.1 (A) 06/23/2020   HTN -controlled with losartan 100 mg daily, Lasix 40 mg daily, and Toprol 100 mg daily.  He denies dizziness, lightheadedness, chest pain, or shortness of breath BP Readings from Last 3 Encounters:  01/07/21 110/80  08/12/20 140/80  06/23/20 128/68    He also reports that he fell yesterday while in the yard. He was using a seeder and lost his footing. He fell onto his right side and reports rib pain. Has improved with heating pad. Did not hit his head.   Review of Systems See HPI   Past Medical History:  Diagnosis Date   Acute renal failure ([place]) 02/09/2013   Aortic stenosis 08/25/2010   a. [place] aortic root replacement with a St. Jude mechanical valve and Hemashield conduit 02/2004.   Arthritis    Asthma    AV BLOCK, COMPLETE    a. s/p St Jude dual chamber pacemaker 02/2004.   CAD (coronary artery disease)    a. s/p CABGx2 (LIMA-dLAD, SVG-Cx). b. Low risk nuc 12/2012 without ischemia, EF 46% mild apical hypokinesia (EF 55% inf HK by echo).   Carotid artery disease (HCC)    a. 0-39% bilateral ICA stenosis, stable mild hard plaque in carotid bulbs. F/u 03/2014 recommended.   Diabetes mellitus without complication ([place])    type 2    DIVERTICULOSIS, COLON 10/17/2007   Ejection fraction    a. EF 55% with inf HK, mild MR by echo 12/2012.   GERD (gastroesophageal reflux disease)    hxof    GOUT 01/16/2007   Headache    hx of migraines    HEMORRHOIDS, INTERNAL 11/01/2008   Hx of adenomatous polyp of colon 02/2019   no recall needed   HYPERLIPIDEMIA  01/16/2007   HYPERTENSION 01/16/2007   Impaired glucose tolerance    LEG EDEMA 11/27/2008   Special screening for malignant neoplasm of prostate    Warfarin anticoagulation    AVR    Social History   Socioeconomic History   Marital status: Married    Spouse name: Not on file   Number of children: 3   Years of education: Not on file   Highest education level: Not on file  Occupational History   Occupation: retired  Tobacco Use   Smoking status: Former    Pack years: 0.00    Types: Cigarettes    Quit date: 07/12/1973    Years since quitting: 47.5   Smokeless tobacco: Never  Vaping Use   Vaping Use: Never used  Substance and Sexual Activity   Alcohol use: No   Drug use: No   Sexual activity: Not on file  Other Topics Concern   Not on file  Social History Narrative   The patient is married and retired   He reports 3 grown children   Former smoker, no alcohol or tobacco or drug use at this time   Social Determinants of Radio broadcast assistant Strain: Low Risk    Difficulty of Paying Living Expenses: Not  hard at all  Food Insecurity: No Food Insecurity   Worried About Charity fundraiser in the Last Year: Never true   Ran Out of Food in the Last Year: Never true  Transportation Needs: No Transportation Needs   Lack of Transportation (Medical): No   Lack of Transportation (Non-Medical): No  Physical Activity: Sufficiently Active   Days of Exercise per Week: 7 days   Minutes of Exercise per Session: 30 min  Stress: No Stress Concern Present   Feeling of Stress : Not at all  Social Connections: Moderately Integrated   Frequency of Communication with Friends and Family: More than three times a week   Frequency of Social Gatherings with Friends and Family: More than three times a week   Attends Religious Services: More than 4 times per year   Active Member of Clubs or Organizations: No   Attends Archivist Meetings: Never   Marital Status: Married  Arboriculturist Violence: Not At Risk   Fear of Current or Ex-Partner: No   Emotionally Abused: No   Physically Abused: No   Sexually Abused: No    Past Surgical History:  Procedure Laterality Date   AORTIC VALVE REPLACEMENT     CARDIAC CATHETERIZATION  06/2003   CIRCUMCISION     COLONOSCOPY     CORONARY ARTERY BYPASS GRAFT     coronary stents      prior to bypass    EP IMPLANTABLE DEVICE N/A 05/06/2016   Procedure: [place];  Surgeon: Evans Lance, MD;  Location: [facility];  Service: Cardiovascular;  Laterality: N/A;   ESOPHAGOGASTRODUODENOSCOPY     HERNIA REPAIR     INGUINAL HERNIA REPAIR Bilateral 02/07/2013   Procedure: LAPAROSCOPIC BILATERAL INGUINAL HERNIA REPAIR;  Surgeon: Imogene Burn. Georgette Dover, MD;  Location: [facility];  Service: General;  Laterality: Bilateral;   INSERTION OF MESH Bilateral 02/07/2013   Procedure: INSERTION OF MESH;  Surgeon: Imogene Burn. Georgette Dover, MD;  Location: [facility];  Service: General;  Laterality: Bilateral;   LUMBAR LAMINECTOMY/DECOMPRESSION MICRODISCECTOMY N/A 03/15/2018   Procedure: Complete decompressive lumbar laminectomy for spinal stenosis of L4-L5 and foraminotomy for L4-L5 root on right;  Surgeon: [name], MD;  Location: [facility];  Service: Orthopedics;  Laterality: N/A;   PACEMAKER INSERTION     SHOULDER SURGERY Bilateral    SKIN BIOPSY  05/04/2019   BASAL CELL CARCINOMA//MID CENTRAL NASAL   SKIN BIOPSY  10/11/2019   Superficial basal cell carcinoma - left superior central forehead shave    Family History  Problem Relation Age of Onset   Heart attack Father    Kidney cancer Father    Hypertension Mother    Diabetes Mother    Diabetes Brother    Epilepsy Sister    Diabetes Sister    Diabetes Brother    Colon cancer Neg Hx    Rectal cancer Neg Hx    Stomach cancer Neg Hx    Esophageal cancer Neg Hx     Allergies  Allergen Reactions   Bee Venom Shortness Of Breath and Swelling   Codeine Phosphate Hives, Shortness Of  Breath, Itching, Rash and Other (See Comments)    Tolerated multiple doses of IV morphine   Metoprolol Tartrate Other (See Comments)    loss of vision. Can take the XL tablet not regular    Pramipexole Other (See Comments)    Hallucinations     Tramadol     Pt has hallucinations  Ramipril Rash and Cough    Current Outpatient Medications on File Prior to Visit  Medication Sig Dispense Refill   Accu-Chek FastClix Lancets MISC USE TO CHECK BLOOD SUGAR TWICE A DAY AND AS NEEDED 306 each 2   ACCU-CHEK SMARTVIEW test strip TEST BLOOD SUGAR TWICE DAILY AND AS NEEDED 300 strip 2   acetaminophen (TYLENOL) 650 MG CR tablet Take 1,300 mg by mouth 2 (two) times daily.     albuterol (VENTOLIN HFA) 108 (90 Base) MCG/ACT inhaler Inhale 2 puffs into the lungs every 6 (six) hours as needed for wheezing or shortness of breath. 54 g 0   Ascorbic Acid (VITAMIN C) 1000 MG tablet Take 1,000 mg by mouth daily.     aspirin 81 MG tablet Take 81 mg by mouth every evening.      atorvastatin (LIPITOR) 40 MG tablet TAKE 1 TABLET EVERY DAY AT 6 PM. 90 tablet 3   calcium carbonate (OS-CAL) 600 MG TABS Take 600 mg by mouth 2 (two) times daily with a meal.     carbidopa-levodopa (SINEMET IR) 25-100 MG tablet Take 1 tablet by mouth 3 (three) times daily.      cetirizine (ZYRTEC) 10 MG tablet Take 10 mg by mouth daily.      Cholecalciferol (VITAMIN D3) 125 MCG (5000 UT) TABS Take 1 tablet by mouth daily.     fluticasone (FLONASE) 50 MCG/ACT nasal spray Place 2 sprays into both nostrils daily. 16 g 6   furosemide (LASIX) 40 MG tablet TAKE 1 TABLET TWICE DAILY 180 tablet 1   guaiFENesin-dextromethorphan (ROBITUSSIN DM) 100-10 MG/5ML syrup Take 5 mLs by mouth every 4 (four) hours as needed for cough.     losartan (COZAAR) 100 MG tablet TAKE 1 TABLET EVERY DAY (NEED MD APPOINTMENT FOR REFILLS) 90 tablet 3   melatonin 5 MG TABS Take 5 mg by mouth at bedtime.     metFORMIN (GLUCOPHAGE) 500 MG tablet TAKE 1 TABLET TWICE DAILY  WITH A MEAL (Patient taking differently: Take 500 mg by mouth daily with breakfast.) 180 tablet 1   metoprolol succinate (TOPROL-XL) 100 MG 24 hr tablet TAKE 1 TABLET EVERY DAY 90 tablet 3   Misc Natural Products (TART CHERRY ADVANCED) CAPS Take 2 capsules by mouth daily.      Multiple Vitamin (MULTIVITAMIN) tablet Take 1 tablet by mouth daily.       nitroGLYCERIN (NITROSTAT) 0.4 MG SL tablet Place 1 tablet (0.4 mg total) under the tongue every 5 (five) minutes as needed. For chest pain 25 tablet 3   potassium chloride (KLOR-CON) 10 MEQ tablet TAKE 1 TABLET TWICE DAILY 180 tablet 0   Probiotic Product (PHILLIPS COLON HEALTH PO) Take 1 capsule by mouth daily.     vitamin B-12 (CYANOCOBALAMIN) 1000 MCG tablet Take 1,000 mcg by mouth daily.     warfarin (COUMADIN) 10 MG tablet TAKE 1 TABLET DAILY OR AS DIRECTED BY COUMADIN CLINIC 90 tablet 1   No current facility-administered medications on file prior to visit.    BP 110/80   Pulse 72   Temp 98.7 F (37.1 C) (Oral)   Ht 5\' 5"  (1.651 m)   Wt 201 lb (91.2 kg)   SpO2 96%   BMI 33.45 kg/m       Objective:   Physical Exam Vitals and nursing note reviewed.  Constitutional:      Appearance: Normal appearance.  Cardiovascular:     Rate and Rhythm: Normal rate and regular rhythm.     Pulses:  Normal pulses.     Heart sounds: Normal heart sounds.  Pulmonary:     Effort: Pulmonary effort is normal.     Breath sounds: Normal breath sounds.  Abdominal:     General: Abdomen is flat.     Palpations: Abdomen is soft.     Tenderness: There is abdominal tenderness.  Musculoskeletal:        General: Normal range of motion.  Skin:    General: Skin is warm and dry.  Neurological:     General: No focal deficit present.     Mental Status: He is alert and oriented to person, place, and time.  Psychiatric:        Mood and Affect: Mood normal.        Behavior: Behavior normal.      Assessment & Plan:  1. Diabetes mellitus without complication  ([place])  - POC HgB A1c- 5.9  - I will have him decrease Metformin from 500 mg BID to daily dosing.  - Follow up in three months for CPE   2. Rib pain on right side - discussed xray but decided against  - Continue with heating pad   3. Primary hypertension - Well controlled.  - No change in medications   [name], NP

## 2021-01-07 NOTE — Patient Instructions (Signed)
Description   Continue taking Warfarin 1 tablet daily except for 1/2 tablet on Sundays. Be consistent with your 1 serving of greens per week. Recheck INR in 4 weeks (normally 6 weeks). Call with any changes. # [phone].  Fax number: [phone]

## 2021-01-28 ENCOUNTER — Ambulatory Visit (INDEPENDENT_AMBULATORY_CARE_PROVIDER_SITE_OTHER): Payer: Medicare HMO | Admitting: *Deleted

## 2021-01-28 ENCOUNTER — Other Ambulatory Visit: Payer: Self-pay

## 2021-01-28 DIAGNOSIS — Z952 Presence of prosthetic heart valve: Secondary | ICD-10-CM

## 2021-01-28 DIAGNOSIS — Z09 Encounter for follow-up examination after completed treatment for conditions other than malignant neoplasm: Secondary | ICD-10-CM

## 2021-01-28 DIAGNOSIS — Z5181 Encounter for therapeutic drug level monitoring: Secondary | ICD-10-CM | POA: Diagnosis not present

## 2021-01-28 LAB — POCT INR: INR: 3.3 — AB (ref 2.0–3.0)

## 2021-01-28 NOTE — Patient Instructions (Signed)
Description   Do not take any Warfarin today then continue taking Warfarin 1 tablet daily except for 1/2 tablet on Sundays. Be consistent with your 1 serving of greens per week-7/20-adding another leafy in diet (total 3 per week). Recheck INR in 3 weeks (normally 6 weeks). Call with any changes. # [phone].  Fax number: [phone]

## 2021-02-02 ENCOUNTER — Other Ambulatory Visit: Payer: Self-pay | Admitting: Adult Health

## 2021-02-02 ENCOUNTER — Telehealth: Payer: Self-pay | Admitting: *Deleted

## 2021-02-02 NOTE — Telephone Encounter (Signed)
Wife called to report she forgot to have pt hold his warfarin dose last Wednesday. Advised to have pt take a 1/2 tonight & keep the regular warfarin dose the same. She verbalized understanding and states he has had some leafy veggies as well.

## 2021-02-05 ENCOUNTER — Ambulatory Visit (INDEPENDENT_AMBULATORY_CARE_PROVIDER_SITE_OTHER): Payer: Medicare HMO

## 2021-02-05 DIAGNOSIS — I442 Atrioventricular block, complete: Secondary | ICD-10-CM | POA: Diagnosis not present

## 2021-02-06 LAB — CUP PACEART REMOTE DEVICE CHECK
Battery Remaining Longevity: 71 mo
Battery Remaining Percentage: 57 %
Battery Voltage: 2.98 V
Brady Statistic AP VP Percent: 31 %
Brady Statistic AP VS Percent: 1 %
Brady Statistic AS VP Percent: 69 %
Brady Statistic AS VS Percent: 1 %
Brady Statistic RA Percent Paced: 30 %
Brady Statistic RV Percent Paced: 99 %
Date Time Interrogation Session: 20220728020015
Implantable Lead Implant Date: 20050816
Implantable Lead Implant Date: 20050816
Implantable Lead Location: 753859
Implantable Lead Location: 753860
Implantable Pulse Generator Implant Date: 20171026
Lead Channel Impedance Value: 480 Ohm
Lead Channel Impedance Value: 690 Ohm
Lead Channel Pacing Threshold Amplitude: 0.5 V
Lead Channel Pacing Threshold Amplitude: 1 V
Lead Channel Pacing Threshold Pulse Width: 0.5 ms
Lead Channel Pacing Threshold Pulse Width: 0.5 ms
Lead Channel Sensing Intrinsic Amplitude: 12 mV
Lead Channel Sensing Intrinsic Amplitude: 2.6 mV
Lead Channel Setting Pacing Amplitude: 0.75 V
Lead Channel Setting Pacing Amplitude: 2 V
Lead Channel Setting Pacing Pulse Width: 0.5 ms
Lead Channel Setting Sensing Sensitivity: 4 mV
Pulse Gen Model: 2272
Pulse Gen Serial Number: 3182792

## 2021-02-10 ENCOUNTER — Encounter: Payer: Self-pay | Admitting: Adult Health

## 2021-02-12 NOTE — Telephone Encounter (Signed)
PT wife called to provide her cell [phone] to be called at as she is going out right now in case Tillie Rung calls

## 2021-02-12 NOTE — Telephone Encounter (Signed)
PT called to advise that she missed [name] call again and is returning the missed call

## 2021-02-12 NOTE — Telephone Encounter (Signed)
PT wife called to advise that she is returning the missed call from [place].

## 2021-02-13 NOTE — Telephone Encounter (Signed)
PT wife called wanting to speak to [place] or Tommi Rumps about the medication for the PT. Wanting to get clarification on what is going on and I advise currently out of office and be back next week. PT wife states she will have her cell on her at all times next week: [phone].

## 2021-02-17 ENCOUNTER — Other Ambulatory Visit: Payer: Self-pay | Admitting: Adult Health

## 2021-02-17 DIAGNOSIS — M25561 Pain in right knee: Secondary | ICD-10-CM | POA: Diagnosis not present

## 2021-02-17 DIAGNOSIS — M25562 Pain in left knee: Secondary | ICD-10-CM | POA: Diagnosis not present

## 2021-02-17 MED ORDER — FUROSEMIDE 40 MG PO TABS: 40.0000 mg | ORAL_TABLET | Freq: Every day | ORAL | 1 refills | Status: DC

## 2021-02-17 MED ORDER — POTASSIUM CHLORIDE CRYS ER 10 MEQ PO TBCR: 10.0000 meq | EXTENDED_RELEASE_TABLET | Freq: Every day | ORAL | 1 refills | Status: DC

## 2021-02-20 ENCOUNTER — Ambulatory Visit (INDEPENDENT_AMBULATORY_CARE_PROVIDER_SITE_OTHER): Payer: Medicare HMO | Admitting: *Deleted

## 2021-02-20 ENCOUNTER — Other Ambulatory Visit: Payer: Self-pay

## 2021-02-20 DIAGNOSIS — Z952 Presence of prosthetic heart valve: Secondary | ICD-10-CM | POA: Diagnosis not present

## 2021-02-20 DIAGNOSIS — Z5181 Encounter for therapeutic drug level monitoring: Secondary | ICD-10-CM

## 2021-02-20 DIAGNOSIS — Z09 Encounter for follow-up examination after completed treatment for conditions other than malignant neoplasm: Secondary | ICD-10-CM

## 2021-02-20 LAB — POCT INR: INR: 1.5 — AB (ref 2.0–3.0)

## 2021-02-20 NOTE — Patient Instructions (Signed)
Description   Take 1.5 tablets of warfarin today and tomorrow, then continue to take 1 tablet daily except for 1/2 a tablet on Sundays.Be consistent with your 2-3 serving of greens per week.  Recheck INR in 1.5 weeks. [facility] [phone].

## 2021-03-02 ENCOUNTER — Other Ambulatory Visit: Payer: Self-pay

## 2021-03-02 ENCOUNTER — Ambulatory Visit (INDEPENDENT_AMBULATORY_CARE_PROVIDER_SITE_OTHER): Payer: Medicare HMO

## 2021-03-02 DIAGNOSIS — Z09 Encounter for follow-up examination after completed treatment for conditions other than malignant neoplasm: Secondary | ICD-10-CM | POA: Diagnosis not present

## 2021-03-02 DIAGNOSIS — Z5181 Encounter for therapeutic drug level monitoring: Secondary | ICD-10-CM | POA: Diagnosis not present

## 2021-03-02 DIAGNOSIS — Z952 Presence of prosthetic heart valve: Secondary | ICD-10-CM

## 2021-03-02 LAB — POCT INR: INR: 2.5 (ref 2.0–3.0)

## 2021-03-02 NOTE — Patient Instructions (Signed)
-   continue to take 1 tablet daily except for 1/2 a tablet on Sundays. - Be consistent with your 2-3 serving of greens per week.   - Recheck INR in 3 weeks.  [facility] [phone].

## 2021-03-04 NOTE — Progress Notes (Signed)
Remote pacemaker transmission.   

## 2021-03-18 ENCOUNTER — Other Ambulatory Visit: Payer: Self-pay | Admitting: Cardiology

## 2021-03-18 ENCOUNTER — Other Ambulatory Visit: Payer: Self-pay | Admitting: Adult Health

## 2021-03-18 ENCOUNTER — Other Ambulatory Visit: Payer: Self-pay | Admitting: Internal Medicine

## 2021-03-18 DIAGNOSIS — E119 Type 2 diabetes mellitus without complications: Secondary | ICD-10-CM

## 2021-03-23 ENCOUNTER — Other Ambulatory Visit: Payer: Self-pay

## 2021-03-23 ENCOUNTER — Ambulatory Visit (INDEPENDENT_AMBULATORY_CARE_PROVIDER_SITE_OTHER): Payer: Medicare HMO

## 2021-03-23 DIAGNOSIS — Z5181 Encounter for therapeutic drug level monitoring: Secondary | ICD-10-CM | POA: Diagnosis not present

## 2021-03-23 DIAGNOSIS — Z09 Encounter for follow-up examination after completed treatment for conditions other than malignant neoplasm: Secondary | ICD-10-CM | POA: Diagnosis not present

## 2021-03-23 DIAGNOSIS — Z952 Presence of prosthetic heart valve: Secondary | ICD-10-CM

## 2021-03-23 LAB — POCT INR: INR: 3.6 — AB (ref 2.0–3.0)

## 2021-03-23 NOTE — Patient Instructions (Signed)
Description   Take 0.5 tablet today and then continue to take 1 tablet daily except for 1/2 a tablet on Sundays. - Be consistent with your 2-3 serving of greens per week.   - Recheck INR in 3 weeks.  [facility] [phone].

## 2021-04-01 DIAGNOSIS — G629 Polyneuropathy, unspecified: Secondary | ICD-10-CM | POA: Diagnosis not present

## 2021-04-01 DIAGNOSIS — G20 Parkinson's disease: Secondary | ICD-10-CM | POA: Diagnosis not present

## 2021-04-09 ENCOUNTER — Other Ambulatory Visit: Payer: Self-pay

## 2021-04-09 ENCOUNTER — Encounter: Payer: Self-pay | Admitting: Adult Health

## 2021-04-09 ENCOUNTER — Ambulatory Visit (INDEPENDENT_AMBULATORY_CARE_PROVIDER_SITE_OTHER): Payer: Medicare HMO | Admitting: Adult Health

## 2021-04-09 VITALS — BP 160/88 | HR 76 | Temp 98.6°F | Ht 65.0 in | Wt 200.8 lb

## 2021-04-09 DIAGNOSIS — E785 Hyperlipidemia, unspecified: Secondary | ICD-10-CM

## 2021-04-09 DIAGNOSIS — I442 Atrioventricular block, complete: Secondary | ICD-10-CM | POA: Diagnosis not present

## 2021-04-09 DIAGNOSIS — M170 Bilateral primary osteoarthritis of knee: Secondary | ICD-10-CM

## 2021-04-09 DIAGNOSIS — Z952 Presence of prosthetic heart valve: Secondary | ICD-10-CM

## 2021-04-09 DIAGNOSIS — Z0000 Encounter for general adult medical examination without abnormal findings: Secondary | ICD-10-CM | POA: Diagnosis not present

## 2021-04-09 DIAGNOSIS — I10 Essential (primary) hypertension: Secondary | ICD-10-CM

## 2021-04-09 DIAGNOSIS — Z23 Encounter for immunization: Secondary | ICD-10-CM | POA: Diagnosis not present

## 2021-04-09 DIAGNOSIS — I2510 Atherosclerotic heart disease of native coronary artery without angina pectoris: Secondary | ICD-10-CM | POA: Diagnosis not present

## 2021-04-09 DIAGNOSIS — G20 Parkinson's disease: Secondary | ICD-10-CM

## 2021-04-09 DIAGNOSIS — E119 Type 2 diabetes mellitus without complications: Secondary | ICD-10-CM

## 2021-04-09 DIAGNOSIS — G20A1 Parkinson's disease without dyskinesia, without mention of fluctuations: Secondary | ICD-10-CM

## 2021-04-09 DIAGNOSIS — Z7901 Long term (current) use of anticoagulants: Secondary | ICD-10-CM

## 2021-04-09 LAB — CBC WITH DIFFERENTIAL/PLATELET
Basophils Absolute: 0 10*3/uL (ref 0.0–0.1)
Basophils Relative: 0.4 % (ref 0.0–3.0)
Eosinophils Absolute: 0 10*3/uL (ref 0.0–0.7)
Eosinophils Relative: 0.5 % (ref 0.0–5.0)
HCT: 44.1 % (ref 39.0–52.0)
Hemoglobin: 14.4 g/dL (ref 13.0–17.0)
Lymphocytes Relative: 24.5 % (ref 12.0–46.0)
Lymphs Abs: 2.2 10*3/uL (ref 0.7–4.0)
MCHC: 32.6 g/dL (ref 30.0–36.0)
MCV: 89.5 fl (ref 78.0–100.0)
Monocytes Absolute: 0.5 10*3/uL (ref 0.1–1.0)
Monocytes Relative: 5.8 % (ref 3.0–12.0)
Neutro Abs: 6.3 10*3/uL (ref 1.4–7.7)
Neutrophils Relative %: 68.8 % (ref 43.0–77.0)
Platelets: 200 10*3/uL (ref 150.0–400.0)
RBC: 4.93 Mil/uL (ref 4.22–5.81)
RDW: 14.6 % (ref 11.5–15.5)
WBC: 9.1 10*3/uL (ref 4.0–10.5)

## 2021-04-09 LAB — HEMOGLOBIN A1C: Hgb A1c MFr Bld: 6.4 % (ref 4.6–6.5)

## 2021-04-09 LAB — COMPREHENSIVE METABOLIC PANEL
ALT: 20 U/L (ref 0–53)
AST: 20 U/L (ref 0–37)
Albumin: 4.2 g/dL (ref 3.5–5.2)
Alkaline Phosphatase: 45 U/L (ref 39–117)
BUN: 15 mg/dL (ref 6–23)
CO2: 30 mEq/L (ref 19–32)
Calcium: 9.5 mg/dL (ref 8.4–10.5)
Chloride: 104 mEq/L (ref 96–112)
Creatinine, Ser: 0.97 mg/dL (ref 0.40–1.50)
GFR: 75.68 mL/min (ref >60.00)
Glucose, Bld: 93 mg/dL (ref 70–99)
Potassium: 3.9 mEq/L (ref 3.5–5.1)
Sodium: 141 mEq/L (ref 135–145)
Total Bilirubin: 0.8 mg/dL (ref 0.2–1.2)
Total Protein: 6.2 g/dL (ref 6.0–8.3)

## 2021-04-09 LAB — LIPID PANEL
Cholesterol: 142 mg/dL (ref 0–200)
HDL: 54 mg/dL (ref >39.00)
LDL Cholesterol: 63 mg/dL (ref 0–99)
NonHDL: 88.18
Total CHOL/HDL Ratio: 3
Triglycerides: 124 mg/dL (ref 0.0–149.0)
VLDL: 24.8 mg/dL (ref 0.0–40.0)

## 2021-04-09 LAB — TSH: TSH: 1.74 u[IU]/mL (ref 0.35–5.50)

## 2021-04-09 NOTE — Patient Instructions (Signed)
It was great seeing you today   We will follow up with you regarding your blood work   We will make plans for a follow up appointment when I get your blood work back

## 2021-04-09 NOTE — Progress Notes (Signed)
Subjective:    Patient ID: [name], male    DOB: 1946-04-21, 75 y.o.   MRN: [id number]  HPI Patient presents for yearly preventative medicine examination. He is a pleasant 75 year old male who  has a past medical history of Acute renal failure ([place]) (02/09/2013), Aortic stenosis (08/25/2010), Arthritis, Asthma, AV BLOCK, COMPLETE, CAD (coronary artery disease), Carotid artery disease ([place]), Diabetes mellitus without complication ([place]), DIVERTICULOSIS, COLON (10/17/2007), Ejection fraction, GERD (gastroesophageal reflux disease), GOUT (01/16/2007), Headache, HEMORRHOIDS, INTERNAL (11/01/2008), adenomatous polyp of colon (02/2019), HYPERLIPIDEMIA (01/16/2007), HYPERTENSION (01/16/2007), Impaired glucose tolerance, LEG EDEMA (11/27/2008), Special screening for malignant neoplasm of prostate, and Warfarin anticoagulation.  DM II-controlled with metformin 500 mg twice daily.  He does occasionally check his blood sugars at home with readings in the 120 or below.  He denies symptoms of hypoglycemia. Has his eye exam in January 2023.  Lab Results  Component Value Date   HGBA1C 5.9 (A) 01/07/2021   HTN -takes losartan 100 mg daily, Lasix 40 mg daily, and Toprol 100 mg daily.  He denies dizziness, lightheadedness, chest pain, or shortness of breath. He did not take his BP medications today  BP Readings from Last 3 Encounters:  04/09/21 (!) 160/88  01/07/21 110/80  08/12/20 140/80   Hyperlipidemia/CAD -prescribed Lipitor 40 mg daily.  He denies myalgia or fatigue Lab Results  Component Value Date   CHOL 130 01/09/2020   HDL 51.10 01/09/2020   LDLCALC 60 01/09/2020   TRIG 97.0 01/09/2020   CHOLHDL 3 01/09/2020   Aortic Stenosis with mechanical valve replacement/Complete AV block -mechanical valve replacement in 2005.  Is on chronic anticoagulation with Coumadin.  Last echo was in 2017 and showed normal function with no concerning issues.  Also with pacemaker.  Is followed by cardiology on a regular  basis  Osteoarthritis -noted by orthopedics for bilateral knee pain.  Receives steroid injections every 3 to 4 months.  Feels stable  Parkinson's disease and essential tremor-seen by neurology outside of the [facility] system.  Currently prescribed Sinemet IR 25-100 mg.   All immunizations and health maintenance protocols were reviewed with the patient and needed orders were placed.  Appropriate screening laboratory values were ordered for the patient including screening of hyperlipidemia, renal function and hepatic function. If indicated by BPH, a PSA was ordered.  Medication reconciliation,  past medical history, social history, problem list and allergies were reviewed in detail with the patient  Goals were established with regard to weight loss, exercise, and  diet in compliance with medications.  He tries to stay active with walking and tries to eat a heart healthy diet.    Review of Systems  Constitutional: Negative.   HENT: Negative.    Eyes: Negative.   Respiratory: Negative.    Cardiovascular: Negative.   Gastrointestinal: Negative.   Endocrine: Negative.   Genitourinary: Negative.   Musculoskeletal: Negative.   Skin: Negative.   Allergic/Immunologic: Negative.   Neurological:  Positive for tremors and numbness (mild- bilateral feet).  Hematological: Negative.   Psychiatric/Behavioral: Negative.    All other systems reviewed and are negative.  Past Medical History:  Diagnosis Date   Acute renal failure ([place]) 02/09/2013   Aortic stenosis 08/25/2010   a. [place] aortic root replacement with a St. Jude mechanical valve and Hemashield conduit 02/2004.   Arthritis    Asthma    AV BLOCK, COMPLETE    a. s/p St Jude dual chamber pacemaker 02/2004.   CAD (coronary artery disease)  a. s/p CABGx2 (LIMA-dLAD, SVG-Cx). b. Low risk nuc 12/2012 without ischemia, EF 46% mild apical hypokinesia (EF 55% inf HK by echo).   Carotid artery disease (H[facility])    a. 0-39% bilateral ICA stenosis,  stable mild hard plaque in carotid bulbs. F/u 03/2014 recommended.   Diabetes mellitus without complication ([place])    type 2    DIVERTICULOSIS, COLON 10/17/2007   Ejection fraction    a. EF 55% with inf HK, mild MR by echo 12/2012.   GERD (gastroesophageal reflux disease)    hxof    GOUT 01/16/2007   Headache    hx of migraines    HEMORRHOIDS, INTERNAL 11/01/2008   Hx of adenomatous polyp of colon 02/2019   no recall needed   HYPERLIPIDEMIA 01/16/2007   HYPERTENSION 01/16/2007   Impaired glucose tolerance    LEG EDEMA 11/27/2008   Special screening for malignant neoplasm of prostate    Warfarin anticoagulation    AVR    Social History   Socioeconomic History   Marital status: Married    Spouse name: Not on file   Number of children: 3   Years of education: Not on file   Highest education level: Not on file  Occupational History   Occupation: retired  Tobacco Use   Smoking status: Former    Types: Cigarettes    Quit date: 07/12/1973    Years since quitting: 47.7   Smokeless tobacco: Never  Vaping Use   Vaping Use: Never used  Substance and Sexual Activity   Alcohol use: No   Drug use: No   Sexual activity: Not on file  Other Topics Concern   Not on file  Social History Narrative   The patient is married and retired   He reports 3 grown children   Former smoker, no alcohol or tobacco or drug use at this time   Social Determinants of Radio broadcast assistant Strain: Low Risk    Difficulty of Paying Living Expenses: Not hard at all  Food Insecurity: No Food Insecurity   Worried About Charity fundraiser in the Last Year: Never true   Arboriculturist in the Last Year: Never true  Transportation Needs: No Transportation Needs   Lack of Transportation (Medical): No   Lack of Transportation (Non-Medical): No  Physical Activity: Sufficiently Active   Days of Exercise per Week: 7 days   Minutes of Exercise per Session: 30 min  Stress: No Stress Concern Present   Feeling of  Stress : Not at all  Social Connections: Moderately Integrated   Frequency of Communication with Friends and Family: More than three times a week   Frequency of Social Gatherings with Friends and Family: More than three times a week   Attends Religious Services: More than 4 times per year   Active Member of [employer] or Organizations: No   Attends Archivist Meetings: Never   Marital Status: Married  Human resources officer Violence: Not At Risk   Fear of Current or Ex-Partner: No   Emotionally Abused: No   Physically Abused: No   Sexually Abused: No    Past Surgical History:  Procedure Laterality Date   AORTIC VALVE REPLACEMENT     CARDIAC CATHETERIZATION  06/2003   CIRCUMCISION     COLONOSCOPY     CORONARY ARTERY BYPASS GRAFT     coronary stents      prior to bypass    EP IMPLANTABLE DEVICE N/A 05/06/2016   Procedure: PPM  Nature conservation officer;  Surgeon: Evans Lance, MD;  Location: [facility];  Service: Cardiovascular;  Laterality: N/A;   ESOPHAGOGASTRODUODENOSCOPY     HERNIA REPAIR     INGUINAL HERNIA REPAIR Bilateral 02/07/2013   Procedure: LAPAROSCOPIC BILATERAL INGUINAL HERNIA REPAIR;  Surgeon: Imogene Burn. Georgette Dover, MD;  Location: [facility];  Service: General;  Laterality: Bilateral;   INSERTION OF MESH Bilateral 02/07/2013   Procedure: INSERTION OF MESH;  Surgeon: Imogene Burn. Georgette Dover, MD;  Location: [facility];  Service: General;  Laterality: Bilateral;   LUMBAR LAMINECTOMY/DECOMPRESSION MICRODISCECTOMY N/A 03/15/2018   Procedure: Complete decompressive lumbar laminectomy for spinal stenosis of L4-L5 and foraminotomy for L4-L5 root on right;  Surgeon: [name], MD;  Location: [facility];  Service: Orthopedics;  Laterality: N/A;   PACEMAKER INSERTION     SHOULDER SURGERY Bilateral    SKIN BIOPSY  05/04/2019   BASAL CELL CARCINOMA//MID CENTRAL NASAL   SKIN BIOPSY  10/11/2019   Superficial basal cell carcinoma - left superior central forehead shave    Family History  Problem  Relation Age of Onset   Heart attack Father    Kidney cancer Father    Hypertension Mother    Diabetes Mother    Diabetes Brother    Epilepsy Sister    Diabetes Sister    Diabetes Brother    Colon cancer Neg Hx    Rectal cancer Neg Hx    Stomach cancer Neg Hx    Esophageal cancer Neg Hx     Allergies  Allergen Reactions   Bee Venom Shortness Of Breath and Swelling   Codeine Phosphate Hives, Shortness Of Breath, Itching, Rash and Other (See Comments)    Tolerated multiple doses of IV morphine   Metoprolol Tartrate Other (See Comments)    loss of vision. Can take the XL tablet not regular    Pramipexole Other (See Comments)    Hallucinations     Tramadol     Pt has hallucinations    Ramipril Rash and Cough    Current Outpatient Medications on File Prior to Visit  Medication Sig Dispense Refill   Accu-Chek FastClix Lancets MISC USE TO CHECK BLOOD SUGAR TWICE A DAY AND AS NEEDED 306 each 2   ACCU-CHEK SMARTVIEW test strip TEST BLOOD SUGAR TWICE DAILY AND AS NEEDED 300 strip 2   acetaminophen (TYLENOL) 650 MG CR tablet Take 1,300 mg by mouth 2 (two) times daily.     albuterol (VENTOLIN HFA) 108 (90 Base) MCG/ACT inhaler INHALE 2 PUFFS INTO THE LUNGS EVERY 6 HOURS AS NEEDED FOR WHEEZING OR SHORTNESS OF BREATH. 3 each 0   Ascorbic Acid (VITAMIN C) 1000 MG tablet Take 1,000 mg by mouth daily.     aspirin 81 MG tablet Take 81 mg by mouth every evening.      atorvastatin (LIPITOR) 40 MG tablet TAKE 1 TABLET EVERY DAY AT 6 PM. 90 tablet 3   calcium carbonate (OS-CAL) 600 MG TABS Take 600 mg by mouth 2 (two) times daily with a meal.     carbidopa-levodopa (SINEMET IR) 25-100 MG tablet Take 1 tablet by mouth 3 (three) times daily.      cetirizine (ZYRTEC) 10 MG tablet Take 10 mg by mouth daily.      Cholecalciferol (VITAMIN D3) 125 MCG (5000 UT) TABS Take 1 tablet by mouth daily.     fluticasone (FLONASE) 50 MCG/ACT nasal spray Place 2 sprays into both nostrils daily. 16 g 6    furosemide (LASIX) 40 MG  tablet Take 1 tablet (40 mg total) by mouth daily. 90 tablet 1   guaiFENesin-dextromethorphan (ROBITUSSIN DM) 100-10 MG/5ML syrup Take 5 mLs by mouth every 4 (four) hours as needed for cough.     losartan (COZAAR) 100 MG tablet TAKE 1 TABLET EVERY DAY (NEED MD APPOINTMENT FOR REFILLS) 90 tablet 1   melatonin 5 MG TABS Take 5 mg by mouth at bedtime.     metFORMIN (GLUCOPHAGE) 500 MG tablet TAKE 1 TABLET TWICE DAILY WITH A MEAL 180 tablet 1   metoprolol succinate (TOPROL-XL) 100 MG 24 hr tablet TAKE 1 TABLET EVERY DAY 90 tablet 3   Misc Natural Products (TART CHERRY ADVANCED) CAPS Take 2 capsules by mouth daily.      Multiple Vitamin (MULTIVITAMIN) tablet Take 1 tablet by mouth daily.       nitroGLYCERIN (NITROSTAT) 0.4 MG SL tablet Place 1 tablet (0.4 mg total) under the tongue every 5 (five) minutes as needed. For chest pain 25 tablet 3   potassium chloride (KLOR-CON) 10 MEQ tablet Take 1 tablet (10 mEq total) by mouth daily. 90 tablet 1   Probiotic Product (PHILLIPS COLON HEALTH PO) Take 1 capsule by mouth daily.     vitamin B-12 (CYANOCOBALAMIN) 1000 MCG tablet Take 1,000 mcg by mouth daily.     warfarin (COUMADIN) 10 MG tablet TAKE 1 TABLET DAILY OR AS DIRECTED BY COUMADIN CLINIC 90 tablet 1   No current facility-administered medications on file prior to visit.    BP (!) 160/88   Pulse 76   Temp 98.6 F (37 C) (Oral)   Ht 5\' 5"  (1.651 m)   Wt 200 lb 12.8 oz (91.1 kg)   SpO2 98%   BMI 33.41 kg/m        Objective:   Physical Exam Vitals and nursing note reviewed.  Constitutional:      General: He is not in acute distress.    Appearance: Normal appearance. He is well-developed. He is obese.  HENT:     Head: Normocephalic and atraumatic.     Right Ear: Tympanic membrane, ear canal and external ear normal. There is no impacted cerumen.     Left Ear: Tympanic membrane, ear canal and external ear normal. There is no impacted cerumen.     Nose: Nose normal.  No congestion or rhinorrhea.     Mouth/Throat:     Mouth: Mucous membranes are moist.     Dentition: Abnormal dentition.     Pharynx: Oropharynx is clear. No oropharyngeal exudate or posterior oropharyngeal erythema.  Eyes:     General:        Right eye: No discharge.        Left eye: No discharge.     Extraocular Movements: Extraocular movements intact.     Conjunctiva/sclera: Conjunctivae normal.     Pupils: Pupils are equal, round, and reactive to light.  Neck:     Vascular: No carotid bruit.     Trachea: No tracheal deviation.  Cardiovascular:     Rate and Rhythm: Normal rate and regular rhythm.     Pulses: Normal pulses.     Heart sounds: Normal heart sounds. No murmur heard.   No friction rub. No gallop.  Pulmonary:     Effort: Pulmonary effort is normal. No respiratory distress.     Breath sounds: Normal breath sounds. No stridor. No wheezing, rhonchi or rales.  Chest:     Chest wall: No tenderness.  Abdominal:     General: Bowel sounds  are normal. There is no distension.     Palpations: Abdomen is soft. There is no mass.     Tenderness: There is no abdominal tenderness. There is no right CVA tenderness, left CVA tenderness, guarding or rebound.     Hernia: No hernia is present.  Musculoskeletal:        General: No swelling, tenderness, deformity or signs of injury. Normal range of motion.     Right lower leg: No edema.     Left lower leg: No edema.  Lymphadenopathy:     Cervical: No cervical adenopathy.  Skin:    General: Skin is warm and dry.     Capillary Refill: Capillary refill takes less than 2 seconds.     Coloration: Skin is not jaundiced or pale.     Findings: No bruising, erythema, lesion or rash.  Neurological:     General: No focal deficit present.     Mental Status: He is alert and oriented to person, place, and time.     Cranial Nerves: No cranial nerve deficit.     Sensory: Sensation is intact. No sensory deficit.     Motor: Tremor (BLE tremor)  present. No weakness.     Coordination: Coordination normal.     Gait: Gait normal.     Deep Tendon Reflexes: Reflexes normal.  Psychiatric:        Mood and Affect: Mood normal.        Behavior: Behavior normal.        Thought Content: Thought content normal.        Judgment: Judgment normal.       Assessment & Plan:  1. Routine general medical examination at a health care facility - One year follow up  - Stay active and eat healthy  - CBC with Differential/Platelet; Future - Comprehensive metabolic panel; Future - Lipid panel; Future - TSH; Future - Hemoglobin A1c; Future - Hemoglobin A1c - TSH - Lipid panel - Comprehensive metabolic panel - CBC with Differential/Platelet  2. Diabetes mellitus without complication ([place]) - Consider dose change of metformin  - Likely follow up in 6 months  - CBC with Differential/Platelet; Future - Comprehensive metabolic panel; Future - Lipid panel; Future - TSH; Future - Hemoglobin A1c; Future - Hemoglobin A1c - TSH - Lipid panel - Comprehensive metabolic panel - CBC with Differential/Platelet  3. Dyslipidemia - Consider increase in statin  - CBC with Differential/Platelet; Future - Comprehensive metabolic panel; Future - Lipid panel; Future - TSH; Future - Hemoglobin A1c; Future - Hemoglobin A1c - TSH - Lipid panel - Comprehensive metabolic panel - CBC with Differential/Platelet  4. Essential hypertension - Controlled in past. Did not take BP meds prior to appointment  - CBC with Differential/Platelet; Future - Comprehensive metabolic panel; Future - Lipid panel; Future - TSH; Future - Hemoglobin A1c; Future - Hemoglobin A1c - TSH - Lipid panel - Comprehensive metabolic panel - CBC with Differential/Platelet  5. Coronary artery disease involving native coronary artery of native heart without angina pectoris - Continue statin  - Follow up with cardiology as directed - CBC with Differential/Platelet; Future -  Comprehensive metabolic panel; Future - Lipid panel; Future - TSH; Future - Hemoglobin A1c; Future - Hemoglobin A1c - TSH - Lipid panel - Comprehensive metabolic panel - CBC with Differential/Platelet  6. Aortic valve replaced - Continue anticoagulation  - Follow up with cardiology as directed - CBC with Differential/Platelet; Future - Comprehensive metabolic panel; Future - Lipid panel; Future - TSH; Future -  Hemoglobin A1c; Future - Hemoglobin A1c - TSH - Lipid panel - Comprehensive metabolic panel - CBC with Differential/Platelet  7. AV BLOCK, COMPLETE - Follow Cardiology plan of care - CBC with Differential/Platelet; Future - Comprehensive metabolic panel; Future - Lipid panel; Future - TSH; Future - Hemoglobin A1c; Future - Hemoglobin A1c - TSH - Lipid panel - Comprehensive metabolic panel - CBC with Differential/Platelet  8. Primary osteoarthritis of both knees - Follow up with orthopedics as directed - CBC with Differential/Platelet; Future - Comprehensive metabolic panel; Future - Lipid panel; Future - TSH; Future - Hemoglobin A1c; Future - Hemoglobin A1c - TSH - Lipid panel - Comprehensive metabolic panel - CBC with Differential/Platelet  9. Warfarin anticoagulation - Follow up with Coumadin clinic   10. Parkinson's disease ([place]) - Follow up with Neurology as directed - CBC with Differential/Platelet; Future - Comprehensive metabolic panel; Future - Lipid panel; Future - TSH; Future - Hemoglobin A1c; Future - Hemoglobin A1c - TSH - Lipid panel - Comprehensive metabolic panel - CBC with Differential/Platelet  11. Need for immunization against influenza  - Flu Vaccine QUAD High Dose(Fluad)  [name], NP

## 2021-04-10 ENCOUNTER — Ambulatory Visit (INDEPENDENT_AMBULATORY_CARE_PROVIDER_SITE_OTHER): Payer: Medicare HMO | Admitting: *Deleted

## 2021-04-10 DIAGNOSIS — Z09 Encounter for follow-up examination after completed treatment for conditions other than malignant neoplasm: Secondary | ICD-10-CM | POA: Diagnosis not present

## 2021-04-10 DIAGNOSIS — Z952 Presence of prosthetic heart valve: Secondary | ICD-10-CM

## 2021-04-10 DIAGNOSIS — Z5181 Encounter for therapeutic drug level monitoring: Secondary | ICD-10-CM | POA: Diagnosis not present

## 2021-04-10 LAB — POCT INR: INR: 2.9 (ref 2.0–3.0)

## 2021-04-10 NOTE — Patient Instructions (Signed)
Description   Continue taking 1 tablet daily except for 1/2 tablet on Sundays.Be consistent with your 2-3 serving of greens per week. Recheck INR in 4 weeks. Coumadin Clinic [phone].

## 2021-04-13 ENCOUNTER — Other Ambulatory Visit: Payer: Self-pay

## 2021-04-13 ENCOUNTER — Ambulatory Visit: Payer: Medicare HMO

## 2021-04-13 ENCOUNTER — Ambulatory Visit (HOSPITAL_COMMUNITY): Payer: Medicare HMO | Attending: Cardiovascular Disease

## 2021-04-13 DIAGNOSIS — I77810 Thoracic aortic ectasia: Secondary | ICD-10-CM | POA: Insufficient documentation

## 2021-04-13 DIAGNOSIS — Z952 Presence of prosthetic heart valve: Secondary | ICD-10-CM

## 2021-04-13 LAB — ECHOCARDIOGRAM COMPLETE
AR max vel: 1.96 cm2
AV Area VTI: 2.18 cm2
AV Area mean vel: 2.02 cm2
AV Mean grad: 6 mmHg
AV Peak grad: 11.2 mmHg
Ao pk vel: 1.67 m/s
Area-P 1/2: 3.48 cm2
P 1/2 time: 415 msec
S' Lateral: 3.9 cm

## 2021-04-16 ENCOUNTER — Telehealth (HOSPITAL_BASED_OUTPATIENT_CLINIC_OR_DEPARTMENT_OTHER): Payer: Self-pay | Admitting: *Deleted

## 2021-04-16 DIAGNOSIS — Z952 Presence of prosthetic heart valve: Secondary | ICD-10-CM

## 2021-04-16 DIAGNOSIS — I77810 Thoracic aortic ectasia: Secondary | ICD-10-CM

## 2021-04-16 NOTE — Telephone Encounter (Signed)
Mildly reduced pump function but overall stable.  Aortic mechanical valve stable.  Ascending aorta not well visualized on this echocardiogram as was seen on prior echo. Please check CT angio chest aorta with contrast.   Pt aware of the above results and orders.  Aware he will be contacted to be scheduled for CTA.

## 2021-04-17 ENCOUNTER — Telehealth: Payer: Self-pay | Admitting: Pharmacist

## 2021-04-17 ENCOUNTER — Other Ambulatory Visit: Payer: Self-pay | Admitting: Internal Medicine

## 2021-04-17 NOTE — Chronic Care Management (AMB) (Signed)
    Chronic Care Management Pharmacy Assistant   Name: [name]  MRN: [id number] DOB: [birth year]  04/20/2021 APPOINTMENT REMINDER  [name] was reminded to have all medications, supplements and any blood glucose and blood pressure readings available for review with [name], Pharm. D, at his telephone visit on 04/20/2021 at 1:00.   Questions: Have you had any recent office visit or specialist visit outside of [place]? Yes, his neurology physician Dr.Onasanya  Are there any concerns you would like to discuss during your office visit? No  Are you having any problems obtaining your medications? (Whether it pharmacy issues or cost) No  If patient has any PAP medications ask if they are having any problems getting their PAP medication or refill? No PAP meds.  Care Gaps: AWV - message sent to Ramond Craver to schedule Covid vaccine - never done Ophthalmology - overdue  Star Rating Drug: Atorvastatin 40 mg - last filled 01/28/2021 90DS at [facility] Losartan 100 mg - last filled 02/16/2021 90DS at [facility] Metformin 500 mg - last filled 02/16/2021 90DS at [facility]        Verified with patients wife.   Any gaps in medications fill history?  [place] Pharmacist Assistant [phone]

## 2021-04-20 ENCOUNTER — Ambulatory Visit (INDEPENDENT_AMBULATORY_CARE_PROVIDER_SITE_OTHER): Payer: Medicare HMO | Admitting: Pharmacist

## 2021-04-20 DIAGNOSIS — E119 Type 2 diabetes mellitus without complications: Secondary | ICD-10-CM

## 2021-04-20 DIAGNOSIS — I10 Essential (primary) hypertension: Secondary | ICD-10-CM

## 2021-04-20 NOTE — Progress Notes (Signed)
Chronic Care Management Pharmacy Note  04/20/2021 Name:  [name] MRN:  [id number] DOB:  [birth year]  Summary: BP not at goal < 130/80 per recent office readings LDL at goal < 70 A1c at goal < 7%   Recommendations/Changes made from today's visit: -Continued BP monitoring at home and recommended bring BP cuff to cardiology appt next week to verify accuracy -Recommended to continue without fish oil as triglycerides and HDL were WNL without it   Plan: BP assessment in 2-3 months  Subjective: [name] is an 75 y.o. year old male who is a primary patient of [name], NP.  The CCM team was consulted for assistance with disease management and care coordination needs.    Engaged with patient by telephone for follow up visit in response to provider referral for pharmacy case management and/or care coordination services.   Consent to Services:  The patient was given information about Chronic Care Management services, agreed to services, and gave verbal consent prior to initiation of services.  Please see initial visit note for detailed documentation.   Patient Care Team: [name], NP as PCP - General (Family Medicine) Jerline Pain, MD as PCP - Cardiology (Cardiology) Evans Lance, MD as PCP - Electrophysiology (Cardiology) [name], MD as Consulting Physician (Orthopedic Surgery) [name], MD as Consulting Physician (Dermatology) Viona Gilmore, [facility] as Pharmacist (Pharmacist)  Recent office visits: 04/09/21 [name], NP: Patient presented for annual exam. BP elevated in office. Administered influenza vaccine. Follow up in 6 months.   01/07/21 [name], NP: Patient presented for HTN and DM follow up. A1c deceased to 5.9%. Decreased metformin to 500 mg daily.  06/11/20 Ofilia Neas, LPN: Patient presented for medicare annual wellness exam.   Recent consult visits: 04/10/21 Derrel Nip, RN (cardiology): Patient presented  for anti-coag visit. INR 2.9, goal 2-3. Continued warfarin 5 mg (10 mg x 0.5) every Sun; 10 mg (10 mg x 1) all other days. Recheck in 4 weeks.  02/17/21 [name], MD (emergeortho): Unable to access notes.  08/12/20 Barrington Ellison, PA-C (cardiology): Patient presented for pacemaker follow up.   01/23/20 Melody Combs, RN (Neurology): Pt reduced dose of Sinemet to 25/100 mg to 1 tab TID and is feeling much better without any nausea and symptom control.   11/06/19 Griselda Miner (Dermatology): history of malignant neoplasm of skin, basal cell carcinoma of other parts of face diagnosis.  Hospital visits: None in previous 6 months  Objective:  Lab Results  Component Value Date   CREATININE 0.97 04/09/2021   BUN 15 04/09/2021   GFR 75.68 04/09/2021   GFRNONAA 87 05/08/2020   GFRAA 101 05/08/2020   NA 141 04/09/2021   K 3.9 04/09/2021   CALCIUM 9.5 04/09/2021   CO2 30 04/09/2021   GLUCOSE 93 04/09/2021    Lab Results  Component Value Date/Time   HGBA1C 6.4 04/09/2021 09:58 AM   HGBA1C 5.9 (A) 01/07/2021 10:34 AM   HGBA1C 6.1 (A) 06/23/2020 01:43 PM   HGBA1C 6.2 01/09/2020 10:42 AM   HGBA1C 5.8 10/16/2019 01:10 PM   HGBA1C 6.0 09/28/2018 08:22 AM   GFR 75.68 04/09/2021 09:58 AM   GFR 90.19 01/09/2020 10:42 AM   MICROALBUR 0.8 10/11/2017 09:05 AM   MICROALBUR <0.7 10/11/2016 01:27 PM    Last diabetic Eye exam:  Lab Results  Component Value Date/Time   HMDIABEYEEXA No Retinopathy 06/25/2016 12:00 AM    Last diabetic Foot exam: No results found for: HMDIABFOOTEX  Lab Results  Component Value Date   CHOL 142 04/09/2021   HDL 54.00 04/09/2021   LDLCALC 63 04/09/2021   TRIG 124.0 04/09/2021   CHOLHDL 3 04/09/2021    Hepatic Function Latest Ref Rng & Units 04/09/2021 01/09/2020 12/28/2018  Total Protein 6.0 - 8.3 g/dL 6.2 6.2 5.9(L)  Albumin 3.5 - 5.2 g/dL 4.2 4.4 4.1  AST 0 - 37 U/L _0 ALT 0 - 53 U/L _1 Alk Phosphatase 39 - 117 U/L 45 49 51  Total  Bilirubin 0.2 - 1.2 mg/dL 0.8 0.7 0.5  Bilirubin, Direct 0.0 - 0.3 mg/dL - - -    Lab Results  Component Value Date/Time   TSH 1.74 04/09/2021 09:58 AM   TSH 1.72 01/09/2020 10:42 AM    CBC Latest Ref Rng & Units 04/09/2021 01/09/2020 12/28/2018  WBC 4.0 - 10.5 K/uL 9.1 7.5 8.7  Hemoglobin 13.0 - 17.0 g/dL 14.4 13.9 13.9  Hematocrit 39.0 - 52.0 % 44.1 41.9 42.3  Platelets 150.0 - 400.0 K/uL 200.0 197.0 213.0    No results found for: VD25OH  Clinical ASCVD: Yes  The 10-year ASCVD risk score (Arnett DK, et al., 2019) is: 59.4%   Values used to calculate the score:     Age: 75 years     Sex: Male     Is Non-Hispanic African American: No     Diabetic: Yes     Tobacco smoker: No     Systolic Blood Pressure: 827 mmHg     Is BP treated: Yes     HDL Cholesterol: 54 mg/dL     Total Cholesterol: 142 mg/dL    Depression screen [facility] 2/9 06/11/2020 01/09/2020 10/11/2016  Decreased Interest 0 0 0  Down, Depressed, Hopeless 0 0 0  PHQ - 2 Score 0 0 0  Altered sleeping 0 - -  Tired, decreased energy 0 - -  Change in appetite 0 - -  Feeling bad or failure about yourself  0 - -  Trouble concentrating 0 - -  Moving slowly or fidgety/restless 0 - -  Suicidal thoughts 0 - -  PHQ-9 Score 0 - -  Difficult doing work/chores Not difficult at all - -  Some recent data might be hidden      Social History   Tobacco Use  Smoking Status Former   Types: Cigarettes   Quit date: 07/12/1973   Years since quitting: 47.8  Smokeless Tobacco Never   BP Readings from Last 3 Encounters:  04/09/21 (!) 160/88  01/07/21 110/80  08/12/20 140/80   Pulse Readings from Last 3 Encounters:  04/09/21 76  01/07/21 72  08/12/20 80   Wt Readings from Last 3 Encounters:  04/09/21 200 lb 12.8 oz (91.1 kg)  01/07/21 201 lb (91.2 kg)  08/12/20 201 lb (91.2 kg)   BMI Readings from Last 3 Encounters:  04/09/21 33.41 kg/m  01/07/21 33.45 kg/m  08/12/20 31.96 kg/m    Assessment/Interventions: Review of  patient past medical history, allergies, medications, health status, including review of consultants reports, laboratory and other test data, was performed as part of comprehensive evaluation and provision of chronic care management services.   SDOH:  (Social Determinants of Health) assessments and interventions performed: No  SDOH Screenings   Alcohol Screen: Low Risk    Last Alcohol Screening Score (AUDIT): 0  Depression (PHQ2-9): Low Risk    PHQ-2 Score: 0  Financial Resource Strain: Low Risk    Difficulty of Paying Living  Expenses: Not hard at all  Food Insecurity: No Food Insecurity   Worried About Charity fundraiser in the Last Year: Never true   Ran Out of Food in the Last Year: Never true  Housing: Low Risk    Last Housing Risk Score: 0  Physical Activity: Sufficiently Active   Days of Exercise per Week: 7 days   Minutes of Exercise per Session: 30 min  Social Connections: Moderately Integrated   Frequency of Communication with Friends and Family: More than three times a week   Frequency of Social Gatherings with Friends and Family: More than three times a week   Attends Religious Services: More than 4 times per year   Active Member of [employer] or Organizations: No   Attends Archivist Meetings: Never   Marital Status: Married  Stress: No Stress Concern Present   Feeling of Stress : Not at all  Tobacco Use: Medium Risk   Smoking Tobacco Use: Former   Smokeless Tobacco Use: Never  Transportation Needs: No Data processing manager (Medical): No   Lack of Transportation (Non-Medical): No    CCM Care Plan  Allergies  Allergen Reactions   Bee Venom Shortness Of Breath and Swelling   Codeine Phosphate Hives, Shortness Of Breath, Itching, Rash and Other (See Comments)    Tolerated multiple doses of IV morphine   Metoprolol Tartrate Other (See Comments)    loss of vision. Can take the XL tablet not regular    Pramipexole Other (See Comments)     Hallucinations     Tramadol     Pt has hallucinations    Ramipril Rash and Cough    Medications Reviewed Today     Reviewed by Franco Collet, CMA (Certified Medical Assistant) on 04/09/21 at [place] List Status: <None>   Medication Order Taking? Sig Documenting Provider Last Dose Status Informant  Accu-Chek FastClix Lancets MISC [id number] Yes USE TO CHECK BLOOD SUGAR TWICE A DAY AND AS NEEDED Nafziger, Tommi Rumps, NP Taking Active   ACCU-CHEK SMARTVIEW test strip [id number] Yes TEST BLOOD SUGAR TWICE DAILY AND AS NEEDED Nafziger, Tommi Rumps, NP Taking Active   acetaminophen (TYLENOL) 650 MG CR tablet [id number] Yes Take 1,300 mg by mouth 2 (two) times daily. [provider] Taking Active Spouse/Significant Other  albuterol (VENTOLIN HFA) 108 (90 Base) MCG/ACT inhaler [id number] Yes INHALE 2 PUFFS INTO THE LUNGS EVERY 6 HOURS AS NEEDED FOR WHEEZING OR SHORTNESS OF BREATH. [name], NP Taking Active   Ascorbic Acid (VITAMIN C) 1000 MG tablet [id number] Yes Take 1,000 mg by mouth daily. [provider] Taking Active Spouse/Significant Other  aspirin 81 MG tablet [id number] Yes Take 81 mg by mouth every evening.  [provider] Taking Active Spouse/Significant Other  atorvastatin (LIPITOR) 40 MG tablet [id number] Yes TAKE 1 TABLET EVERY DAY AT 6 PM. Evans Lance, MD Taking Active   calcium carbonate (OS-CAL) 600 MG TABS [id number] Yes Take 600 mg by mouth 2 (two) times daily with a meal. [provider] Taking Active Spouse/Significant Other  carbidopa-levodopa (SINEMET IR) 25-100 MG tablet [id number] Yes Take 1 tablet by mouth 3 (three) times daily.  [provider] Taking Active   cetirizine (ZYRTEC) 10 MG tablet [id number] Yes Take 10 mg by mouth daily.  [provider] Taking Active Spouse/Significant Other  Cholecalciferol (VITAMIN D3) 125 MCG (5000 UT) TABS [id number] Yes Take 1 tablet by mouth daily. [provider] Taking Active    fluticasone (  FLONASE) 50 MCG/ACT nasal spray [id number] Yes Place 2 sprays into both nostrils daily. Nafziger, Tommi Rumps, NP Taking Active   furosemide (LASIX) 40 MG tablet [id number] Yes Take 1 tablet (40 mg total) by mouth daily. Nafziger, Tommi Rumps, NP Taking Active   guaiFENesin-dextromethorphan [facility] DM) 100-10 MG/5ML syrup [id number] Yes Take 5 mLs by mouth every 4 (four) hours as needed for cough. [provider] Taking Active Spouse/Significant Other  losartan (COZAAR) 100 MG tablet [id number] Yes TAKE 1 TABLET EVERY DAY (NEED MD APPOINTMENT FOR REFILLS) Evans Lance, MD Taking Active   melatonin 5 MG TABS [id number] Yes Take 5 mg by mouth at bedtime. [provider] Taking Active   metFORMIN (GLUCOPHAGE) 500 MG tablet [id number] Yes TAKE 1 TABLET TWICE DAILY WITH A MEAL Nafziger, Cory, NP Taking Active   metoprolol succinate (TOPROL-XL) 100 MG 24 hr tablet [id number] Yes TAKE 1 TABLET EVERY DAY Nafziger, Tommi Rumps, NP Taking Active   Misc Natural Products (TART CHERRY ADVANCED) CAPS [id number] Yes Take 2 capsules by mouth daily.  [provider] Taking Active Spouse/Significant Other  Multiple Vitamin (MULTIVITAMIN) tablet [id number] Yes Take 1 tablet by mouth daily.   [provider] Taking Active Spouse/Significant Other  nitroGLYCERIN (NITROSTAT) 0.4 MG SL tablet [id number] Yes Place 1 tablet (0.4 mg total) under the tongue every 5 (five) minutes as needed. For chest pain Tommie Raymond, NP Taking Active   potassium chloride (KLOR-CON) 10 MEQ tablet [id number] Yes Take 1 tablet (10 mEq total) by mouth daily. [name], NP Taking Active   Probiotic Product ([place]) [id number] Yes Take 1 capsule by mouth daily. [provider] Taking Active Spouse/Significant Other  vitamin B-12 (CYANOCOBALAMIN) 1000 MCG tablet [id number] Yes Take 1,000 mcg by mouth daily. 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 02/58/5277   Diabetes mellitus without complication ([place]) 82/42/3536   Encounter for therapeutic drug monitoring 08/08/2013   Aortic valve replaced 02/21/2013   Mitral regurgitation 01/21/2013   Recurrent right inguinal hernia 01/02/2013   Left inguinal hernia 01/02/2013   Carotid artery disease ([place])    Warfarin anticoagulation    Hx of CABG    Aortic stenosis 08/25/2010   AORTIC VALVE REPLACEMENT, HX OF 05/23/2009   AV BLOCK, COMPLETE 11/01/2008   PACEMAKER, PERMANENT 11/01/2008   GOUT 01/16/2007   CAD (coronary artery disease) 01/16/2007   Dyslipidemia 01/16/2007   Essential hypertension 01/16/2007    Immunization History  Administered Date(s) Administered   Fluad Quad(high Dose 65+) 03/22/2019, 04/01/2020, 04/09/2021   Influenza Split 03/31/2011, 04/10/2012   Influenza Whole 04/04/2008, 05/09/2009, 03/18/2010   Influenza, High Dose Seasonal PF 04/28/2015, 04/06/2016, 04/12/2017, 03/30/2018   Influenza,inj,Quad PF,6+ Mos 04/02/2013   Influenza-Unspecified 04/05/2014   Pneumococcal Conjugate-13 02/13/2014   Pneumococcal Polysaccharide-23 10/07/2011   Tdap 10/07/2011, 07/16/2012   Zoster Recombinat (Shingrix) 10/11/2017, 12/12/2017   Zoster, Live 10/07/2011   Conditions to be addressed/monitored:  Hypertension, Hyperlipidemia, Diabetes, Heart Failure, Coronary Artery Disease, Asthma, Osteoarthritis, Gout, Allergic Rhinitis and Aortic valve replacement and Parkinson's disease  Conditions addressed this visit: Hypertension, hyperlipidemia, asthma  Care Plan :  [place]  Updates made by Viona Gilmore, [place] since 04/20/2021 12:00 AM     Problem: Problem: Hypertension, Hyperlipidemia, Diabetes, Heart Failure, Coronary Artery Disease, Asthma, Osteoarthritis, Gout, Allergic Rhinitis and Aortic valve replacement and Parkinson's disease      Long-Range Goal: Patient-Specific Goal   Start Date: 10/20/2020  Expected End Date: 10/20/2021  Recent Progress: On track  Priority: High  Note:   Current Barriers:  Unable to independently monitor therapeutic efficacy  Pharmacist Clinical Goal(s):  Patient will achieve adherence to monitoring guidelines and medication adherence to achieve therapeutic efficacy through collaboration with PharmD and provider.   Interventions: 1:1 collaboration with [name], NP regarding development and update of comprehensive plan of care as evidenced by provider attestation and co-signature Inter-disciplinary care team collaboration (see longitudinal plan of care) Comprehensive medication review performed; medication list updated in electronic medical record  Hypertension (BP goal <130/80) -Not ideally controlled -Current treatment: Losartan 100 mg daily - in PM Metoprolol succinate 100 mg daily - in AM -Medications previously tried: ramipril (rash, cough) -Current home readings: 137/78 HR 64, 126/72 -Current dietary habits: did not discuss -Current exercise habits: walking dog about 4-5 times a day -Reports hypotensive/hypertensive symptoms -Educated on Daily salt intake goal < 2300 mg; Importance of home blood pressure monitoring; Proper BP monitoring technique; -Counseled to monitor BP at home at least weekly, document, and provide log at future appointments -Counseled on diet and exercise extensively Recommended to continue current medication  Hyperlipidemia: (LDL goal < 70) -Controlled -Current treatment: Atorvastatin 40 mg daily at 6pm -Medications previously tried: pravastatin (switched  to high intensity atorvastatin), rosuvastatin (cost), fish oil  -Current dietary patterns: eating more chicken and fish; one serving of greens per week; no fried foods -Current exercise habits: walking dog -Educated on Cholesterol goals;  Benefits of statin for ASCVD risk reduction; -Counseled on diet and exercise extensively Recommended to continue current medication  CAD (Goal: prevent heart attacks and strokes) -Controlled -Current treatment  Nitroglycerin 0.4 mg as needed for chest pain Atorvastatin 40 mg daily Aspirin 81 mg daily -Medications previously tried: none  -Recommended to continue current medication  Aortic valve replacement (Goal: prevent clot formation) -Controlled -Current treatment  Warfarin 5 mg every Sun and 10 mg all other days -Medications previously tried: none  -Counseled on monitoring for signs of bleeding such as unexplained and excessive bleeding from a cut or injury, easy or excessive bruising, blood in urine or stools, and nosebleeds without a known cause   Diabetes (A1c goal <7%) -Controlled -Current medications: rrent medications: Metformin 500 mg 1 tablet daily  -Medications previously tried: glipizide (weakness/shakiness with borderline hypoglycemia) -Current home glucose readings: checks twice weekly fasting glucose: 116, 90-115 (normal) post prandial glucose: none -Denies hypoglycemic/hyperglycemic symptoms -Current meal patterns:  breakfast: one slice of bread lunch: did not discuss  dinner: did not discuss  snacks: not a lot of sweets drinks: water, occasional tea or decaf coffee with artificial sweetener -Current exercise: takes dog out about 4-5 times a day and walks the yard every time -Educated on Exercise goal of 150 minutes per week; Carbohydrate counting and/or plate method -Counseled to check feet daily and get yearly eye exams -Counseled on diet and exercise extensively Recommended to continue current medication  Heart  Failure (Goal: manage symptoms and prevent exacerbations) -Controlled -Last ejection fraction: 55%  -HF type: Diastolic -NYHA Class: II (slight limitation of activity) -AHA HF  Stage: B (Heart disease present - no symptoms present) -Current treatment: Furosemide 40 mg once daily Metoprolol succinate 100 mg daily  Potassium chloride 10 MEQ once daily  -Medications previously tried: none  -Current home BP/HR readings: 120-130s -Current dietary habits: limits salt intake -Current exercise habits: walks dog daily -Educated on Importance of weighing daily; if you gain more than 3 pounds in one day or 5 pounds in one week, call cardiologist Importance of blood pressure control -Counseled on diet and exercise extensively Recommended to continue current medication  Parkinson's disease (Goal: minimize symptoms) -Controlled -Current treatment  Sinemet 25/100 mg, 1 tablet three times daily  -Medications previously tried: Sinemet 4 times daily (hallucinations and GI upset), Requip (ankle swelling), Mirapex (hallucinations) -Recommended to continue current medication   Asthma (Goal: control symptoms) -Controlled -Current treatment  Albuterol two puffs every 6 hours as needed for wheezing or shortness of breath -Medications previously tried: none  -Pulmonary function testing: none -Patient denies consistent use of maintenance inhaler -Frequency of rescue inhaler use: once or twice a week -Counseled on When to use rescue inhaler Differences between maintenance and rescue inhalers -Recommended to continue current medication Consider Symbicort prescription if patient uses albuterol more frequently.  Allergic rhinitis (Goal: minimize symptoms) -Controlled -Current treatment  Cetirizine 10 mg daily (morning)  Flonase 2 sprays into both nostrils daily -Medications previously tried: none  -Recommended to continue current medication  Gout (Goal: prevent flare ups) -Controlled -Current  treatment  Tart cherry two tablets once daily -Medications previously tried: none  -Recommended to continue current medication  Osteoarthritis (Goal: minimize pain) -Controlled -Current treatment  Acetaminophen 639m, 2 tablets twice daily -Medications previously tried: none  -Recommended to continue current medication   Health Maintenance -Vaccine gaps: COVID vaccines (patient declined vaccine) -Current therapy:  Vitamin C 1000 mg once daily  Vitamin B12 1000 mg daily (morning)  Phillips colon health daily (breakfast) Multivitamin daily (morning)  Calcium carbonate 600 mg twice daily with meals Vitamin D 1000 units, 1 tablet once daily  -Educated on Cost vs benefit of each product must be carefully weighed by individual consumer -Patient is satisfied with current therapy and denies issues -Recommended to continue current medication  Patient Goals/Self-Care Activities Patient will:  - take medications as prescribed check blood pressure weekly, document, and provide at future appointments weigh daily, and contact provider if weight gain of > 3 lbs in one day or > 5 lbs in one week  Follow Up Plan: Telephone follow up appointment with care management team member scheduled for: 6 months        Medication Assistance: None required.  Patient affirms current coverage meets needs.  Compliance/Adherence/Medication fill history: Care Gaps: COVID vaccine, eye exam   Star-Rating Drugs: Atorvastatin 40 mg - [facility]21 [facility] - [facility]  [facility] [place] [address]Phone: [phone]: [phone] [place]: [phone] Fax: [phone] Uses pill box? Yes - spouse organizes weekly pill box (AM and PM) and organizes Sinemet separately Pt endorses 100% compliance  We discussed: Current pharmacy is preferred with insurance plan and patient is satisfied with pharmacy services Patient decided to: Continue current medication management strategy  Care Plan and Follow Up Patient Decision:  Patient agrees to Care Plan and Follow-up.  Plan: Telephone follow up appointment with care management team member scheduled for:  6 months  [name], PharmD [place] Pharmacist [place] [facility] [phone]

## 2021-04-20 NOTE — Patient Instructions (Addendum)
Hi Suheyb,  It was great to get catch up with you again!  Don't forget to bring your blood pressure cuff with you to cardiology next week just to check it out.  Please reach out to me if you have any questions or need anything before our follow up!  Best, Maddie  [name], PharmD, [place] [facility]   Visit Information   Goals Addressed   None    Patient Care Plan: CCM Pharmacy Care Plan     Problem Identified: Problem: Hypertension, Hyperlipidemia, Diabetes, Heart Failure, Coronary Artery Disease, Asthma, Osteoarthritis, Gout, Allergic Rhinitis and Aortic valve replacement and Parkinson's disease      Long-Range Goal: Patient-Specific Goal   Start Date: 10/20/2020  Expected End Date: 10/20/2021  Recent Progress: On track  Priority: High  Note:   Current Barriers:  Unable to independently monitor therapeutic efficacy  Pharmacist Clinical Goal(s):  Patient will achieve adherence to monitoring guidelines and medication adherence to achieve therapeutic efficacy through collaboration with PharmD and provider.   Interventions: 1:1 collaboration with [name], NP regarding development and update of comprehensive plan of care as evidenced by provider attestation and co-signature Inter-disciplinary care team collaboration (see longitudinal plan of care) Comprehensive medication review performed; medication list updated in electronic medical record  Hypertension (BP goal <130/80) -Not ideally controlled -Current treatment: Losartan 100 mg daily - in PM Metoprolol succinate 100 mg daily - in AM -Medications previously tried: ramipril (rash, cough) -Current home readings: 137/78 HR 64, 126/72 -Current dietary habits: did not discuss -Current exercise habits: walking dog about 4-5 times a day -Reports hypotensive/hypertensive symptoms -Educated on Daily salt intake goal < 2300 mg; Importance of home blood pressure  monitoring; Proper BP monitoring technique; -Counseled to monitor BP at home at least weekly, document, and provide log at future appointments -Counseled on diet and exercise extensively Recommended to continue current medication  Hyperlipidemia: (LDL goal < 70) -Controlled -Current treatment: Atorvastatin 40 mg daily at 6pm -Medications previously tried: pravastatin (switched to high intensity atorvastatin), rosuvastatin (cost), fish oil  -Current dietary patterns: eating more chicken and fish; one serving of greens per week; no fried foods -Current exercise habits: walking dog -Educated on Cholesterol goals;  Benefits of statin for ASCVD risk reduction; -Counseled on diet and exercise extensively Recommended to continue current medication  CAD (Goal: prevent heart attacks and strokes) -Controlled -Current treatment  Nitroglycerin 0.4 mg as needed for chest pain Atorvastatin 40 mg daily Aspirin 81 mg daily -Medications previously tried: none  -Recommended to continue current medication  Aortic valve replacement (Goal: prevent clot formation) -Controlled -Current treatment  Warfarin 5 mg every Sun and 10 mg all other days -Medications previously tried: none  -Counseled on monitoring for signs of bleeding such as unexplained and excessive bleeding from a cut or injury, easy or excessive bruising, blood in urine or stools, and nosebleeds without a known cause   Diabetes (A1c goal <7%) -Controlled -Current medications: rrent medications: Metformin 500 mg 1 tablet daily  -Medications previously tried: glipizide (weakness/shakiness with borderline hypoglycemia) -Current home glucose readings: checks twice weekly fasting glucose: 116, 90-115 (normal) post prandial glucose: none -Denies hypoglycemic/hyperglycemic symptoms -Current meal patterns:  breakfast: one slice of bread lunch: did not discuss  dinner: did not discuss  snacks: not a lot of sweets drinks: water,  occasional tea or decaf coffee with artificial sweetener -Current exercise: takes dog out about 4-5 times a day and walks the yard every time -Educated on  Exercise goal of 150 minutes per week; Carbohydrate counting and/or plate method -Counseled to check feet daily and get yearly eye exams -Counseled on diet and exercise extensively Recommended to continue current medication  Heart Failure (Goal: manage symptoms and prevent exacerbations) -Controlled -Last ejection fraction: 55%  -HF type: Diastolic -NYHA Class: II (slight limitation of activity) -AHA HF Stage: B (Heart disease present - no symptoms present) -Current treatment: Furosemide 40 mg once daily Metoprolol succinate 100 mg daily  Potassium chloride 10 MEQ once daily  -Medications previously tried: none  -Current home BP/HR readings: 120-130s -Current dietary habits: limits salt intake -Current exercise habits: walks dog daily -Educated on Importance of weighing daily; if you gain more than 3 pounds in one day or 5 pounds in one week, call cardiologist Importance of blood pressure control -Counseled on diet and exercise extensively Recommended to continue current medication  Parkinson's disease (Goal: minimize symptoms) -Controlled -Current treatment  Sinemet 25/100 mg, 1 tablet three times daily  -Medications previously tried: Sinemet 4 times daily (hallucinations and GI upset), Requip (ankle swelling), Mirapex (hallucinations) -Recommended to continue current medication   Asthma (Goal: control symptoms) -Controlled -Current treatment  Albuterol two puffs every 6 hours as needed for wheezing or shortness of breath -Medications previously tried: none  -Pulmonary function testing: none -Patient denies consistent use of maintenance inhaler -Frequency of rescue inhaler use: once or twice a week -Counseled on When to use rescue inhaler Differences between maintenance and rescue inhalers -Recommended to continue  current medication Consider Symbicort prescription if patient uses albuterol more frequently.  Allergic rhinitis (Goal: minimize symptoms) -Controlled -Current treatment  Cetirizine 10 mg daily (morning)  Flonase 2 sprays into both nostrils daily -Medications previously tried: none  -Recommended to continue current medication  Gout (Goal: prevent flare ups) -Controlled -Current treatment  Tart cherry two tablets once daily -Medications previously tried: none  -Recommended to continue current medication  Osteoarthritis (Goal: minimize pain) -Controlled -Current treatment  Acetaminophen 650mg , 2 tablets twice daily -Medications previously tried: none  -Recommended to continue current medication   Health Maintenance -Vaccine gaps: COVID vaccines (patient declined vaccine) -Current therapy:  Vitamin C 1000 mg once daily  Vitamin B12 1000 mg daily (morning)  Phillips colon health daily (breakfast) Multivitamin daily (morning)  Calcium carbonate 600 mg twice daily with meals Vitamin D 1000 units, 1 tablet once daily  -Educated on Cost vs benefit of each product must be carefully weighed by individual consumer -Patient is satisfied with current therapy and denies issues -Recommended to continue current medication  Patient Goals/Self-Care Activities Patient will:  - take medications as prescribed check blood pressure weekly, document, and provide at future appointments weigh daily, and contact provider if weight gain of > 3 lbs in one day or > 5 lbs in one week  Follow Up Plan: Telephone follow up appointment with care management team member scheduled for: 6 months       Patient verbalizes understanding of instructions provided today and agrees to view in [place].  Telephone follow up appointment with pharmacy team member scheduled for: 6 months  Viona Gilmore, [facility]

## 2021-04-28 ENCOUNTER — Encounter: Payer: Self-pay | Admitting: Cardiology

## 2021-04-28 ENCOUNTER — Ambulatory Visit: Payer: Medicare HMO | Admitting: Cardiology

## 2021-04-28 ENCOUNTER — Other Ambulatory Visit: Payer: Self-pay

## 2021-04-28 VITALS — BP 130/70 | HR 75 | Ht 66.0 in | Wt 201.4 lb

## 2021-04-28 DIAGNOSIS — Z7901 Long term (current) use of anticoagulants: Secondary | ICD-10-CM

## 2021-04-28 DIAGNOSIS — Z951 Presence of aortocoronary bypass graft: Secondary | ICD-10-CM | POA: Diagnosis not present

## 2021-04-28 DIAGNOSIS — I5032 Chronic diastolic (congestive) heart failure: Secondary | ICD-10-CM

## 2021-04-28 DIAGNOSIS — I10 Essential (primary) hypertension: Secondary | ICD-10-CM | POA: Diagnosis not present

## 2021-04-28 DIAGNOSIS — I2510 Atherosclerotic heart disease of native coronary artery without angina pectoris: Secondary | ICD-10-CM | POA: Diagnosis not present

## 2021-04-28 DIAGNOSIS — E785 Hyperlipidemia, unspecified: Secondary | ICD-10-CM | POA: Diagnosis not present

## 2021-04-28 DIAGNOSIS — Z952 Presence of prosthetic heart valve: Secondary | ICD-10-CM | POA: Diagnosis not present

## 2021-04-28 DIAGNOSIS — I6523 Occlusion and stenosis of bilateral carotid arteries: Secondary | ICD-10-CM

## 2021-04-28 DIAGNOSIS — Z950 Presence of cardiac pacemaker: Secondary | ICD-10-CM | POA: Diagnosis not present

## 2021-04-28 NOTE — Patient Instructions (Signed)
Medication Instructions:   Your physician recommends that you continue on your current medications as directed. Please refer to the Current Medication list given to you today.  *If you need a refill on your cardiac medications before your next appointment, please call your pharmacy*   Follow-Up: At [facility], you and your health needs are our priority.  As part of our continuing mission to provide you with exceptional heart care, we have created designated Provider Care Teams.  These Care Teams include your primary Cardiologist (physician) and Advanced Practice Providers (APPs -  Physician Assistants and Nurse Practitioners) who all work together to provide you with the care you need, when you need it.  We recommend signing up for the patient portal called "MyChart".  Sign up information is provided on this After Visit Summary.  MyChart is used to connect with patients for Virtual Visits (Telemedicine).  Patients are able to view lab/test results, encounter notes, upcoming appointments, etc.  Non-urgent messages can be sent to your provider as well.   To learn more about what you can do with MyChart, go to [email or web address].    Your next appointment:   1 year(s)  The format for your next appointment:   In Person  Provider:   Candee Furbish, MD

## 2021-04-28 NOTE — Assessment & Plan Note (Signed)
Mechanical AV, 2005. Stable, echo reviewed.

## 2021-04-28 NOTE — Progress Notes (Signed)
Cardiology Office Note:    Date:  04/28/2021   ID:  [name], DOB 1946/05/16, MRN [id number]  PCP:  [name], NP   [facility] Providers Cardiologist:  Candee Furbish, MD Electrophysiologist:  Cristopher Peru, MD     Referring MD: [name], NP     History of Present Illness:    [name] is a 75 y.o. male former patient of Dr. Ron Parker here for follow-up of CAD prior CABG pacemaker mechanical aortic valve.  Dr. Lovena Le has been monitoring his pacemaker and has been doing well, [facility].  His Parkinson's is slowly progressive.  Overall been doing quite well without any chest pain bleeding fevers chills nausea vomiting syncope.  Past Medical History:  Diagnosis Date   Acute renal failure ([place]) 02/09/2013   Aortic stenosis 08/25/2010   a. [place] aortic root replacement with a St. Jude mechanical valve and Hemashield conduit 02/2004.   Arthritis    Asthma    AV BLOCK, COMPLETE    a. s/p St Jude dual chamber pacemaker 02/2004.   CAD (coronary artery disease)    a. s/p CABGx2 (LIMA-dLAD, SVG-Cx). b. Low risk nuc 12/2012 without ischemia, EF 46% mild apical hypokinesia (EF 55% inf HK by echo).   Carotid artery disease (HCC)    a. 0-39% bilateral ICA stenosis, stable mild hard plaque in carotid bulbs. F/u 03/2014 recommended.   Diabetes mellitus without complication ([place])    type 2    DIVERTICULOSIS, COLON 10/17/2007   Ejection fraction    a. EF 55% with inf HK, mild MR by echo 12/2012.   GERD (gastroesophageal reflux disease)    hxof    GOUT 01/16/2007   Headache    hx of migraines    HEMORRHOIDS, INTERNAL 11/01/2008   Hx of adenomatous polyp of colon 02/2019   no recall needed   HYPERLIPIDEMIA 01/16/2007   HYPERTENSION 01/16/2007   Impaired glucose tolerance    LEG EDEMA 11/27/2008   Special screening for malignant neoplasm of prostate    Warfarin anticoagulation    AVR    Past Surgical History:  Procedure Laterality Date   AORTIC VALVE REPLACEMENT     CARDIAC  CATHETERIZATION  06/2003   CIRCUMCISION     COLONOSCOPY     CORONARY ARTERY BYPASS GRAFT     coronary stents      prior to bypass    EP IMPLANTABLE DEVICE N/A 05/06/2016   Procedure: [place];  Surgeon: Evans Lance, MD;  Location: [place] CV [facility];  Service: Cardiovascular;  Laterality: N/A;   ESOPHAGOGASTRODUODENOSCOPY     HERNIA REPAIR     INGUINAL HERNIA REPAIR Bilateral 02/07/2013   Procedure: LAPAROSCOPIC BILATERAL INGUINAL HERNIA REPAIR;  Surgeon: Imogene Burn. Georgette Dover, MD;  Location: [facility];  Service: General;  Laterality: Bilateral;   INSERTION OF MESH Bilateral 02/07/2013   Procedure: INSERTION OF MESH;  Surgeon: Imogene Burn. Georgette Dover, MD;  Location: [facility];  Service: General;  Laterality: Bilateral;   LUMBAR LAMINECTOMY/DECOMPRESSION MICRODISCECTOMY N/A 03/15/2018   Procedure: Complete decompressive lumbar laminectomy for spinal stenosis of L4-L5 and foraminotomy for L4-L5 root on right;  Surgeon: [name], MD;  Location: [facility];  Service: Orthopedics;  Laterality: N/A;   PACEMAKER INSERTION     SHOULDER SURGERY Bilateral    SKIN BIOPSY  05/04/2019   BASAL CELL CARCINOMA//MID CENTRAL NASAL   SKIN BIOPSY  10/11/2019   Superficial basal cell carcinoma - left superior central forehead shave    Current  Medications: Current Meds  Medication Sig   Accu-Chek FastClix Lancets MISC USE TO CHECK BLOOD SUGAR TWICE A DAY AND AS NEEDED   ACCU-CHEK SMARTVIEW test strip TEST BLOOD SUGAR TWICE DAILY AND AS NEEDED   acetaminophen (TYLENOL) 650 MG CR tablet Take 1,300 mg by mouth 2 (two) times daily.   albuterol (VENTOLIN HFA) 108 (90 Base) MCG/ACT inhaler INHALE 2 PUFFS INTO THE LUNGS EVERY 6 HOURS AS NEEDED FOR WHEEZING OR SHORTNESS OF BREATH.   Ascorbic Acid (VITAMIN C) 1000 MG tablet Take 1,000 mg by mouth daily.   aspirin 81 MG tablet Take 81 mg by mouth every evening.    atorvastatin (LIPITOR) 40 MG tablet TAKE 1 TABLET EVERY DAY AT 6 PM.   calcium carbonate (OS-CAL) 600  MG TABS Take 600 mg by mouth 2 (two) times daily with a meal.   carbidopa-levodopa (SINEMET IR) 25-100 MG tablet Take 1 tablet by mouth 3 (three) times daily.    cetirizine (ZYRTEC) 10 MG tablet Take 10 mg by mouth daily.    cholecalciferol (VITAMIN D) 25 MCG (1000 UNIT) tablet Take 1 tablet by mouth daily.   fluticasone (FLONASE) 50 MCG/ACT nasal spray Place 2 sprays into both nostrils daily.   furosemide (LASIX) 40 MG tablet Take 1 tablet (40 mg total) by mouth daily.   guaiFENesin-dextromethorphan (ROBITUSSIN DM) 100-10 MG/5ML syrup Take 5 mLs by mouth every 4 (four) hours as needed for cough.   losartan (COZAAR) 100 MG tablet TAKE 1 TABLET EVERY DAY (NEED MD APPOINTMENT FOR REFILLS)   melatonin 5 MG TABS Take 5 mg by mouth at bedtime.   metFORMIN (GLUCOPHAGE) 500 MG tablet Take 500 mg by mouth daily.   metoprolol succinate (TOPROL-XL) 100 MG 24 hr tablet TAKE 1 TABLET EVERY DAY   Misc Natural Products (TART CHERRY ADVANCED) CAPS Take 2 capsules by mouth daily.    Multiple Vitamin (MULTIVITAMIN) tablet Take 1 tablet by mouth daily.     nitroGLYCERIN (NITROSTAT) 0.4 MG SL tablet Place 1 tablet (0.4 mg total) under the tongue every 5 (five) minutes as needed. For chest pain   potassium chloride (KLOR-CON) 10 MEQ tablet Take 1 tablet (10 mEq total) by mouth daily.   Probiotic Product (PHILLIPS COLON HEALTH PO) Take 1 capsule by mouth daily.   vitamin B-12 (CYANOCOBALAMIN) 1000 MCG tablet Take 1,000 mcg by mouth daily.   warfarin (COUMADIN) 10 MG tablet TAKE 1 TABLET DAILY OR AS DIRECTED BY COUMADIN CLINIC     Allergies:   Bee venom, Codeine phosphate, Metoprolol tartrate, Pramipexole, Tramadol, and Ramipril   Social History   Socioeconomic History   Marital status: Married    Spouse name: Not on file   Number of children: 3   Years of education: Not on file   Highest education level: Not on file  Occupational History   Occupation: retired  Tobacco Use   Smoking status: Former     Types: Cigarettes    Quit date: 07/12/1973    Years since quitting: 47.8   Smokeless tobacco: Never  Vaping Use   Vaping Use: Never used  Substance and Sexual Activity   Alcohol use: No   Drug use: No   Sexual activity: Not on file  Other Topics Concern   Not on file  Social History Narrative   The patient is married and retired   He reports 3 grown children   Former smoker, no alcohol or tobacco or drug use at this time   Social Determinants of Health  Financial Resource Strain: Low Risk    Difficulty of Paying Living Expenses: Not hard at all  Food Insecurity: No Food Insecurity   Worried About Charity fundraiser in the Last Year: Never true   Ran Out of Food in the Last Year: Never true  Transportation Needs: No Transportation Needs   Lack of Transportation (Medical): No   Lack of Transportation (Non-Medical): No  Physical Activity: Sufficiently Active   Days of Exercise per Week: 7 days   Minutes of Exercise per Session: 30 min  Stress: No Stress Concern Present   Feeling of Stress : Not at all  Social Connections: Moderately Integrated   Frequency of Communication with Friends and Family: More than three times a week   Frequency of Social Gatherings with Friends and Family: More than three times a week   Attends Religious Services: More than 4 times per year   Active Member of [employer] or Organizations: No   Attends Music therapist: Never   Marital Status: Married     Family History: The patient's family history includes Diabetes in his brother, brother, mother, and sister; Epilepsy in his sister; Heart attack in his father; Hypertension in his mother; Kidney cancer in his father. There is no history of Colon cancer, Rectal cancer, Stomach cancer, or Esophageal cancer.  ROS:   Please see the history of present illness.     All other systems reviewed and are negative.  EKGs/Labs/Other Studies Reviewed:    The following studies were reviewed  today: ECHO 04/13/21:   1. Distal septal hypokinesis Inferior wall hypokinesis . Left ventricular  ejection fraction, by estimation, is 45 to 50%. The left ventricle has  mildly decreased function. The left ventricle demonstrates regional wall  motion abnormalities (see scoring  diagram/findings for description). The left ventricular internal cavity  size was mildly dilated. There is mild left ventricular hypertrophy. Left  ventricular diastolic parameters are consistent with Grade I diastolic  dysfunction (impaired relaxation).   2. Pacing wires in RA/RV. Right ventricular systolic function is normal.  The right ventricular size is normal. There is normal pulmonary artery  systolic pressure.   3. The mitral valve is abnormal. Mild mitral valve regurgitation. No  evidence of mitral stenosis.   4. Appears to be both central and PVL Post mechanical AVR replacement ST  Jude with Bental stable low gradients in systole . The aortic valve has  been repaired/replaced. Aortic valve regurgitation is mild. No aortic  stenosis is present. Procedure Date:  02/2004.   5. The inferior vena cava is normal in size with greater than 50%  respiratory variability, suggesting right atrial pressure of 3 mmHg.   05/12/20: Thoracic aortic aneurysm with maximal dimension of 4.5 cm  EKG:  EKG is  ordered today.  The ekg ordered today demonstrates SR 1st degree AVB, LBBB  Recent Labs: 04/09/2021: ALT 20; BUN 15; Creatinine, Ser 0.97; Hemoglobin 14.4; Platelets 200.0; Potassium 3.9; Sodium 141; TSH 1.74  Recent Lipid Panel    Component Value Date/Time   CHOL 142 04/09/2021 0958   TRIG 124.0 04/09/2021 0958   TRIG 91 06/14/2006 0825   HDL 54.00 04/09/2021 0958   CHOLHDL 3 04/09/2021 0958   VLDL 24.8 04/09/2021 0958   LDLCALC 63 04/09/2021 0958     Risk Assessment/Calculations:          Physical Exam:    VS:  BP 130/70   Pulse 75   Ht 5\' 6"  (1.676 m)   Wt  201 lb 6.4 oz (91.4 kg)   SpO2 97%    BMI 32.51 kg/m     Wt Readings from Last 3 Encounters:  04/28/21 201 lb 6.4 oz (91.4 kg)  04/09/21 200 lb 12.8 oz (91.1 kg)  01/07/21 201 lb (91.2 kg)     GEN:  Well nourished, well developed in no acute distress HEENT: Normal NECK: No JVD; No carotid bruits LYMPHATICS: No lymphadenopathy CARDIAC: RRR, Sharp S2, no murmurs, rubs, gallops RESPIRATORY:  Clear to auscultation without rales, wheezing or rhonchi  ABDOMEN: Soft, non-tender, non-distended MUSCULOSKELETAL:  No edema; No deformity  SKIN: Warm and dry NEUROLOGIC:  Alert and oriented x 3, Parkinson PSYCHIATRIC:  Normal affect   ASSESSMENT:    1. Coronary artery disease involving native coronary artery of native heart without angina pectoris   2. Essential hypertension   3. Chronic diastolic heart failure ([place])   4. Hx of aortic valve replacement, mechanical   5. Hx of CABG   6. Warfarin anticoagulation   7. Bilateral carotid artery stenosis   8. Dyslipidemia   9. PACEMAKER, PERMANENT    PLAN:    In order of problems listed above: Hx of aortic valve replacement, mechanical Mechanical AV, 2005. Stable, echo reviewed.   Hx of CABG 2005, stable. Sometimes cold sensitivity at scar.  Continue to monitor.  If symptoms were to become more worrisome he will let me know.   Warfarin anticoagulation Mechanical AVR  CAD (coronary artery disease) Stable, no angina. 2014 NUC normal  Carotid artery disease (HCC) Mild plaque. Statin.   Dyslipidemia On high intensity statin no myalgias atorvastatin 40, LDL 63 from outside labs.  PACEMAKER, PERMANENT Dr. Lovena Le.  He does not have an escape rhythm.     Medication Adjustments/Labs and Tests Ordered: Current medicines are reviewed at length with the patient today.  Concerns regarding medicines are outlined above.  Orders Placed This Encounter  Procedures   EKG 12-Lead   No orders of the defined types were placed in this encounter.   Patient Instructions   Medication Instructions:   Your physician recommends that you continue on your current medications as directed. Please refer to the Current Medication list given to you today.  *If you need a refill on your cardiac medications before your next appointment, please call your pharmacy*   Follow-Up: At [facility], you and your health needs are our priority.  As part of our continuing mission to provide you with exceptional heart care, we have created designated Provider Care Teams.  These Care Teams include your primary Cardiologist (physician) and Advanced Practice Providers (APPs -  Physician Assistants and Nurse Practitioners) who all work together to provide you with the care you need, when you need it.  We recommend signing up for the patient portal called "MyChart".  Sign up information is provided on this After Visit Summary.  MyChart is used to connect with patients for Virtual Visits (Telemedicine).  Patients are able to view lab/test results, encounter notes, upcoming appointments, etc.  Non-urgent messages can be sent to your provider as well.   To learn more about what you can do with MyChart, go to [email or web address].    Your next appointment:   1 year(s)  The format for your next appointment:   In Person  Provider:   Candee Furbish, MD      Signed, Candee Furbish, MD  04/28/2021 10:24 AM    [place]

## 2021-04-28 NOTE — Assessment & Plan Note (Signed)
2005, stable. Sometimes cold sensitivity at scar.  Continue to monitor.  If symptoms were to become more worrisome he will let me know.

## 2021-04-28 NOTE — Assessment & Plan Note (Signed)
Stable, no angina. 2014 NUC normal

## 2021-04-28 NOTE — Assessment & Plan Note (Signed)
Mild plaque. Statin.

## 2021-04-28 NOTE — Assessment & Plan Note (Signed)
Mechanical AVR

## 2021-04-28 NOTE — Assessment & Plan Note (Signed)
On high intensity statin no myalgias atorvastatin 40, LDL 63 from outside labs.

## 2021-04-28 NOTE — Assessment & Plan Note (Signed)
Dr. Lovena Le.  He does not have an escape rhythm.

## 2021-05-01 ENCOUNTER — Ambulatory Visit (INDEPENDENT_AMBULATORY_CARE_PROVIDER_SITE_OTHER)
Admission: RE | Admit: 2021-05-01 | Discharge: 2021-05-01 | Disposition: A | Discharge status: Home / Self Care | Payer: Medicare HMO | Source: Ambulatory Visit | Attending: Cardiology | Admitting: Cardiology

## 2021-05-01 ENCOUNTER — Other Ambulatory Visit: Payer: Self-pay

## 2021-05-01 DIAGNOSIS — I77810 Thoracic aortic ectasia: Secondary | ICD-10-CM

## 2021-05-01 DIAGNOSIS — Z952 Presence of prosthetic heart valve: Secondary | ICD-10-CM

## 2021-05-01 DIAGNOSIS — Z9889 Other specified postprocedural states: Secondary | ICD-10-CM | POA: Diagnosis not present

## 2021-05-01 MED ORDER — IOHEXOL 350 MG/ML SOLN
100.0000 mL | Freq: Once | INTRAVENOUS | Status: AC | PRN
  Administered 2021-05-01: 100 mL via INTRAVENOUS

## 2021-05-04 ENCOUNTER — Other Ambulatory Visit (HOSPITAL_BASED_OUTPATIENT_CLINIC_OR_DEPARTMENT_OTHER): Payer: Self-pay | Admitting: *Deleted

## 2021-05-04 DIAGNOSIS — I77810 Thoracic aortic ectasia: Secondary | ICD-10-CM

## 2021-05-04 DIAGNOSIS — Z8679 Personal history of other diseases of the circulatory system: Secondary | ICD-10-CM

## 2021-05-04 NOTE — Progress Notes (Signed)
Stable aorta 45 mm.  Repeat CT in one year  Candee Furbish, MD

## 2021-05-05 ENCOUNTER — Encounter: Payer: Self-pay | Admitting: Adult Health

## 2021-05-06 ENCOUNTER — Other Ambulatory Visit: Payer: Self-pay | Admitting: Adult Health

## 2021-05-06 NOTE — Telephone Encounter (Signed)
Please advise last note:   2. Diabetes mellitus without complication ([place]) - Consider dose change of metformin  - Likely follow up in 6 months

## 2021-05-07 ENCOUNTER — Ambulatory Visit (INDEPENDENT_AMBULATORY_CARE_PROVIDER_SITE_OTHER): Payer: Medicare HMO

## 2021-05-07 DIAGNOSIS — I5032 Chronic diastolic (congestive) heart failure: Secondary | ICD-10-CM | POA: Diagnosis not present

## 2021-05-07 LAB — CUP PACEART REMOTE DEVICE CHECK
Battery Remaining Longevity: 67 mo
Battery Remaining Percentage: 55 %
Battery Voltage: 2.98 V
Brady Statistic AP VP Percent: 32 %
Brady Statistic AP VS Percent: 1 %
Brady Statistic AS VP Percent: 68 %
Brady Statistic AS VS Percent: 1 %
Brady Statistic RA Percent Paced: 31 %
Brady Statistic RV Percent Paced: 99 %
Date Time Interrogation Session: 20221027020014
Implantable Lead Implant Date: 20050816
Implantable Lead Implant Date: 20050816
Implantable Lead Location: 753859
Implantable Lead Location: 753860
Implantable Pulse Generator Implant Date: 20171026
Lead Channel Impedance Value: 480 Ohm
Lead Channel Impedance Value: 680 Ohm
Lead Channel Pacing Threshold Amplitude: 0.5 V
Lead Channel Pacing Threshold Amplitude: 0.875 V
Lead Channel Pacing Threshold Pulse Width: 0.5 ms
Lead Channel Pacing Threshold Pulse Width: 0.5 ms
Lead Channel Sensing Intrinsic Amplitude: 12 mV
Lead Channel Sensing Intrinsic Amplitude: 2.6 mV
Lead Channel Setting Pacing Amplitude: 0.75 V
Lead Channel Setting Pacing Amplitude: 1.875
Lead Channel Setting Pacing Pulse Width: 0.5 ms
Lead Channel Setting Sensing Sensitivity: 4 mV
Pulse Gen Model: 2272
Pulse Gen Serial Number: 3182792

## 2021-05-08 ENCOUNTER — Ambulatory Visit (INDEPENDENT_AMBULATORY_CARE_PROVIDER_SITE_OTHER): Payer: Medicare HMO

## 2021-05-08 ENCOUNTER — Other Ambulatory Visit: Payer: Self-pay

## 2021-05-08 DIAGNOSIS — Z5181 Encounter for therapeutic drug level monitoring: Secondary | ICD-10-CM | POA: Diagnosis not present

## 2021-05-08 DIAGNOSIS — Z09 Encounter for follow-up examination after completed treatment for conditions other than malignant neoplasm: Secondary | ICD-10-CM

## 2021-05-08 DIAGNOSIS — Z952 Presence of prosthetic heart valve: Secondary | ICD-10-CM

## 2021-05-08 LAB — POCT INR: INR: 3.1 — AB (ref 2.0–3.0)

## 2021-05-08 NOTE — Patient Instructions (Signed)
Description   Take 1/2 tablet today, then resume same dosage 1 tablet daily except for 1/2 tablet on Sundays.  Be consistent with your 2-3 serving of greens per week.  Recheck INR in 4 weeks. Coumadin Clinic [phone].

## 2021-05-11 ENCOUNTER — Ambulatory Visit: Payer: Medicare HMO

## 2021-05-11 ENCOUNTER — Telehealth: Payer: Self-pay | Admitting: Adult Health

## 2021-05-11 ENCOUNTER — Telehealth: Payer: Self-pay

## 2021-05-11 DIAGNOSIS — I10 Essential (primary) hypertension: Secondary | ICD-10-CM | POA: Diagnosis not present

## 2021-05-11 DIAGNOSIS — E119 Type 2 diabetes mellitus without complications: Secondary | ICD-10-CM

## 2021-05-11 NOTE — Telephone Encounter (Signed)
Disregard made in error

## 2021-05-11 NOTE — Telephone Encounter (Signed)
Called patient for 100pm Mwv Patient states he completed this same visit with [employer] last month, he was concerned about doing it twice and receiving a bill.  Advised patient to call [employer] and confirm competition patient verbalized agreement and states he does not want to complete today's visit until he calls [employer].  L.Denora Wysocki,LPN

## 2021-05-11 NOTE — Telephone Encounter (Signed)
Left message for patient to call back and schedule Medicare Annual Wellness Visit (AWV) either virtually or in office. Left  my [name] number [phone]   Last AWV 03/25/20  please schedule at anytime with [facility] Nurse Health Advisor 1 or 2   This should be a 45 minute visit.

## 2021-05-11 NOTE — Progress Notes (Unsigned)
FALL RISK PREVENTION PERTAINING TO THE HOME:  Any stairs in or around the home? {YES/NO:21197} If so, are there any without handrails? {YES/NO:21197} Home free of loose throw rugs in walkways, pet beds, electrical cords, etc? {YES/NO:21197} Adequate lighting in your home to reduce risk of falls? {YES/NO:21197}  ASSISTIVE DEVICES UTILIZED TO PREVENT FALLS:  Life alert? {YES/NO:21197} Use of a cane, walker or w/c? {YES/NO:21197} Grab bars in the bathroom? {YES/NO:21197} Shower chair or bench in shower? {YES/NO:21197} Elevated toilet seat or a handicapped toilet? {YES/NO:21197}  TIMED UP AND GO:  Was the test performed? {YES/NO:21197}.  Length of time to ambulate 10 feet: *** sec.   {Appearance of QMGQ:6761950}  Cognitive Function:        Immunizations Immunization History  Administered Date(s) Administered   Fluad Quad(high Dose 65+) 03/22/2019, 04/01/2020, 04/09/2021   Influenza Split 03/31/2011, 04/10/2012   Influenza Whole 04/04/2008, 05/09/2009, 03/18/2010   Influenza, High Dose Seasonal PF 04/28/2015, 04/06/2016, 04/12/2017, 03/30/2018   Influenza,inj,Quad PF,6+ Mos 04/02/2013   Influenza-Unspecified 04/05/2014   Pneumococcal Conjugate-13 02/13/2014   Pneumococcal Polysaccharide-23 10/07/2011   Tdap 10/07/2011, 07/16/2012   Zoster Recombinat (Shingrix) 10/11/2017, 12/12/2017   Zoster, Live 10/07/2011    {TDAP status:2101805}  {Flu Vaccine status:2101806}  {Pneumococcal vaccine status:2101807}  {Covid-19 vaccine status:2101808}  Qualifies for Shingles Vaccine? {YES/NO:21197}  Zostavax completed {YES/NO:21197}  {Shingrix Completed?:2101804}  Screening Tests Health Maintenance  Topic Date Due   COVID-19 Vaccine (1) Never done   OPHTHALMOLOGY EXAM  06/25/2017   HEMOGLOBIN A1C  10/07/2021   FOOT EXAM  04/09/2022   TETANUS/TDAP  07/16/2022   Pneumonia Vaccine 80+ Years old  Completed   INFLUENZA VACCINE  Completed   Hepatitis C Screening  Completed    Zoster Vaccines- Shingrix  Completed   HPV VACCINES  Aged Out    Health Maintenance  Health Maintenance Due  Topic Date Due   COVID-19 Vaccine (1) Never done   OPHTHALMOLOGY EXAM  06/25/2017    {Colorectal cancer screening:2101809}  Lung Cancer Screening: (Low Dose CT Chest recommended if Age 79-80 years, 30 pack-year currently smoking OR have quit w/in 15years.) {DOES NOT does:27190::"does not"} qualify.   Lung Cancer Screening Referral: ***  Additional Screening:  Hepatitis C Screening: {DOES NOT does:27190::"does not"} qualify; Completed ***  Vision Screening: Recommended annual ophthalmology exams for early detection of glaucoma and other disorders of the eye. Is the patient up to date with their annual eye exam?  {YES/NO:21197} Who is the provider or what is the name of the office in which the patient attends annual eye exams? *** If pt is not established with a provider, would they like to be referred to a provider to establish care? {YES/NO:21197}.   Dental Screening: Recommended annual dental exams for proper oral hygiene  Community Resource Referral / Chronic Care Management: CRR required this visit?  {YES/NO:21197}  CCM required this visit?  {YES/NO:21197}     Plan:     I have personally reviewed and noted the following in the patient's chart:   Medical and social history Use of alcohol, tobacco or illicit drugs  Current medications and supplements including opioid prescriptions. {Opioid Prescriptions:[phone]} Functional ability and status Nutritional status Physical activity Advanced directives List of other physicians Hospitalizations, surgeries, and ER visits in previous 12 months Vitals Screenings to include cognitive, depression, and falls Referrals and appointments  In addition, I have reviewed and discussed with patient certain preventive protocols, quality metrics, and best practice recommendations. A written personalized care plan for  preventive services  as well as general preventive health recommendations were provided to patient.     Randel Pigg, LPN   [id number]   Nurse Notes: ***

## 2021-05-15 NOTE — Progress Notes (Signed)
Remote pacemaker transmission.   

## 2021-06-08 ENCOUNTER — Ambulatory Visit (INDEPENDENT_AMBULATORY_CARE_PROVIDER_SITE_OTHER): Payer: Medicare HMO | Admitting: *Deleted

## 2021-06-08 ENCOUNTER — Other Ambulatory Visit: Payer: Self-pay

## 2021-06-08 DIAGNOSIS — Z09 Encounter for follow-up examination after completed treatment for conditions other than malignant neoplasm: Secondary | ICD-10-CM

## 2021-06-08 DIAGNOSIS — Z5181 Encounter for therapeutic drug level monitoring: Secondary | ICD-10-CM

## 2021-06-08 DIAGNOSIS — Z952 Presence of prosthetic heart valve: Secondary | ICD-10-CM

## 2021-06-08 LAB — POCT INR: INR: 3.6 — AB (ref 2.0–3.0)

## 2021-06-08 NOTE — Patient Instructions (Addendum)
Description    Hold warfarin today and then START taking warfarin 1 tablet daily except for 1/2 a tablet on Sundays and Thursdays. Recheck  INR in 3 weeks. [facility] [phone]

## 2021-06-12 ENCOUNTER — Ambulatory Visit: Payer: Medicare HMO

## 2021-06-16 ENCOUNTER — Ambulatory Visit (INDEPENDENT_AMBULATORY_CARE_PROVIDER_SITE_OTHER): Payer: Medicare HMO

## 2021-06-16 ENCOUNTER — Other Ambulatory Visit: Payer: Self-pay | Admitting: Adult Health

## 2021-06-16 VITALS — BP 137/79 | Ht 66.0 in | Wt 196.2 lb

## 2021-06-16 DIAGNOSIS — Z0000 Encounter for general adult medical examination without abnormal findings: Secondary | ICD-10-CM

## 2021-06-16 NOTE — Telephone Encounter (Signed)
Patients wife called because patient's ACCU-check has broken. Patient and wife spoke with insurance who stated that they would need a new prescription for the device, plus test strips and needle cartridges and they would fill it no cost to the patient. Must include needle cartridges and test strips as they would be different from the new one their sending him   Please send new prescription to [facility], [place] Phone:  [phone]  Fax:  [phone]        Good callback number is [phone]    Please advise

## 2021-06-16 NOTE — Patient Instructions (Addendum)
[name] , Thank you for taking time to come for your Medicare Wellness Visit. I appreciate your ongoing commitment to your health goals. Please review the following plan we discussed and let me know if I can assist you in the future.   These are the goals we discussed:  Goals      Exercise 3x per week (30 min per time)        This is a list of the screening recommended for you and due dates:  Health Maintenance  Topic Date Due   Eye exam for diabetics  06/16/2021*   COVID-19 Vaccine (1) 07/02/2021*   Hemoglobin A1C  10/07/2021   Complete foot exam   04/09/2022   Tetanus Vaccine  07/16/2022   Pneumonia Vaccine  Completed   Flu Shot  Completed   Hepatitis C Screening: USPSTF Recommendation to screen - Ages 18-79 yo.  Completed   Zoster (Shingles) Vaccine  Completed   HPV Vaccine  Aged Out   Colon Cancer Screening  Discontinued  *Topic was postponed. The date shown is not the original due date.   Advanced directives: No  Conditions/risks identified: None  Next appointment: Follow up in one year for your annual wellness visit.   Preventive Care 52 Years and Older, Male Preventive care refers to lifestyle choices and visits with your health care provider that can promote health and wellness. What does preventive care include? A yearly physical exam. This is also called an annual well check. Dental exams once or twice a year. Routine eye exams. Ask your health care provider how often you should have your eyes checked. Personal lifestyle choices, including: Daily care of your teeth and gums. Regular physical activity. Eating a healthy diet. Avoiding tobacco and drug use. Limiting alcohol use. Practicing safe sex. Taking low doses of aspirin every day. Taking vitamin and mineral supplements as recommended by your health care provider. What happens during an annual well check? The services and screenings done by your health care provider during your annual well check will  depend on your age, overall health, lifestyle risk factors, and family history of disease. Counseling  Your health care provider may ask you questions about your: Alcohol use. Tobacco use. Drug use. Emotional well-being. Home and relationship well-being. Sexual activity. Eating habits. History of falls. Memory and ability to understand (cognition). Work and work Statistician. Screening  You may have the following tests or measurements: Height, weight, and BMI. Blood pressure. Lipid and cholesterol levels. These may be checked every 5 years, or more frequently if you are over 8 years old. Skin check. Lung cancer screening. You may have this screening every year starting at age 63 if you have a 30-pack-year history of smoking and currently smoke or have quit within the past 15 years. Fecal occult blood test (FOBT) of the stool. You may have this test every year starting at age 41. Flexible sigmoidoscopy or colonoscopy. You may have a sigmoidoscopy every 5 years or a colonoscopy every 10 years starting at age 72. Prostate cancer screening. Recommendations will vary depending on your family history and other risks. Hepatitis C blood test. Hepatitis B blood test. Sexually transmitted disease (STD) testing. Diabetes screening. This is done by checking your blood sugar (glucose) after you have not eaten for a while (fasting). You may have this done every 1-3 years. Abdominal aortic aneurysm (AAA) screening. You may need this if you are a current or former smoker. Osteoporosis. You may be screened starting at age 42 if  you are at high risk. Talk with your health care provider about your test results, treatment options, and if necessary, the need for more tests. Vaccines  Your health care provider may recommend certain vaccines, such as: Influenza vaccine. This is recommended every year. Tetanus, diphtheria, and acellular pertussis (Tdap, Td) vaccine. You may need a Td booster every 10  years. Zoster vaccine. You may need this after age 40. Pneumococcal 13-valent conjugate (PCV13) vaccine. One dose is recommended after age 81. Pneumococcal polysaccharide (PPSV23) vaccine. One dose is recommended after age 75. Talk to your health care provider about which screenings and vaccines you need and how often you need them. This information is not intended to replace advice given to you by your health care provider. Make sure you discuss any questions you have with your health care provider. Document Released: 07/25/2015 Document Revised: 03/17/2016 Document Reviewed: 04/29/2015 Elsevier Interactive Patient Education  2017 [place] Prevention in the Home Falls can cause injuries. They can happen to people of all ages. There are many things you can do to make your home safe and to help prevent falls. What can I do on the outside of my home? Regularly fix the edges of walkways and driveways and fix any cracks. Remove anything that might make you trip as you walk through a door, such as a raised step or threshold. Trim any bushes or trees on the path to your home. Use bright outdoor lighting. Clear any walking paths of anything that might make someone trip, such as rocks or tools. Regularly check to see if handrails are loose or broken. Make sure that both sides of any steps have handrails. Any raised decks and porches should have guardrails on the edges. Have any leaves, snow, or ice cleared regularly. Use sand or salt on walking paths during winter. Clean up any spills in your garage right away. This includes oil or grease spills. What can I do in the bathroom? Use night lights. Install grab bars by the toilet and in the tub and shower. Do not use towel bars as grab bars. Use non-skid mats or decals in the tub or shower. If you need to sit down in the shower, use a plastic, non-slip stool. Keep the floor dry. Clean up any water that spills on the floor as soon as it  happens. Remove soap buildup in the tub or shower regularly. Attach bath mats securely with double-sided non-slip rug tape. Do not have throw rugs and other things on the floor that can make you trip. What can I do in the bedroom? Use night lights. Make sure that you have a light by your bed that is easy to reach. Do not use any sheets or blankets that are too big for your bed. They should not hang down onto the floor. Have a firm chair that has side arms. You can use this for support while you get dressed. Do not have throw rugs and other things on the floor that can make you trip. What can I do in the kitchen? Clean up any spills right away. Avoid walking on wet floors. Keep items that you use a lot in easy-to-reach places. If you need to reach something above you, use a strong step stool that has a grab bar. Keep electrical cords out of the way. Do not use floor polish or wax that makes floors slippery. If you must use wax, use non-skid floor wax. Do not have throw rugs and other things on  the floor that can make you trip. What can I do with my stairs? Do not leave any items on the stairs. Make sure that there are handrails on both sides of the stairs and use them. Fix handrails that are broken or loose. Make sure that handrails are as long as the stairways. Check any carpeting to make sure that it is firmly attached to the stairs. Fix any carpet that is loose or worn. Avoid having throw rugs at the top or bottom of the stairs. If you do have throw rugs, attach them to the floor with carpet tape. Make sure that you have a light switch at the top of the stairs and the bottom of the stairs. If you do not have them, ask someone to add them for you. What else can I do to help prevent falls? Wear shoes that: Do not have high heels. Have rubber bottoms. Are comfortable and fit you well. Are closed at the toe. Do not wear sandals. If you use a stepladder: Make sure that it is fully opened.  Do not climb a closed stepladder. Make sure that both sides of the stepladder are locked into place. Ask someone to hold it for you, if possible. Clearly mark and make sure that you can see: Any grab bars or handrails. First and last steps. Where the edge of each step is. Use tools that help you move around (mobility aids) if they are needed. These include: Canes. Walkers. Scooters. Crutches. Turn on the lights when you go into a dark area. Replace any light bulbs as soon as they burn out. Set up your furniture so you have a clear path. Avoid moving your furniture around. If any of your floors are uneven, fix them. If there are any pets around you, be aware of where they are. Review your medicines with your doctor. Some medicines can make you feel dizzy. This can increase your chance of falling. Ask your doctor what other things that you can do to help prevent falls. This information is not intended to replace advice given to you by your health care provider. Make sure you discuss any questions you have with your health care provider. Document Released: 04/24/2009 Document Revised: 12/04/2015 Document Reviewed: 08/02/2014 Elsevier Interactive Patient Education  2017 [employer].

## 2021-06-16 NOTE — Progress Notes (Signed)
Subjective:   [name] is a 75 y.o. male who presents for Medicare Annual/Subsequent preventive examination.  Review of Systems    No ROS Cardiac Risk Factors include: advanced age (>95men, >84 women);diabetes mellitus;male gender    Objective:    Today's Vitals   06/16/21 1255  BP: 137/79  Weight: 196 lb 3.2 oz (89 kg)  Height: 5\' 6"  (1.676 m)   Body mass index is 31.67 kg/m.  Advanced Directives 06/16/2021 06/11/2020 03/15/2018 03/09/2018 05/06/2016 01/26/2016 05/02/2015  Does Patient Have a Medical Advance Directive? No No No No No No Yes  Type of Advance Directive - - - - - - -  Copy of [place] in Chart? - - - - - - -  Would patient like information on creating a medical advance directive? No - Patient declined Yes (MAU/Ambulatory/Procedural Areas - Information given) No - Patient declined No - Patient declined Yes - Educational materials given - -  Pre-existing out of facility DNR order (yellow form or pink MOST form) - - - - - - -    Current Medications (verified) Outpatient Encounter Medications as of 06/16/2021  Medication Sig   Accu-Chek FastClix Lancets MISC USE TO CHECK BLOOD SUGAR TWICE A DAY AND AS NEEDED   ACCU-CHEK SMARTVIEW test strip TEST BLOOD SUGAR TWICE DAILY AND AS NEEDED   acetaminophen (TYLENOL) 650 MG CR tablet Take 1,300 mg by mouth 2 (two) times daily.   albuterol (VENTOLIN HFA) 108 (90 Base) MCG/ACT inhaler INHALE 2 PUFFS INTO THE LUNGS EVERY 6 HOURS AS NEEDED FOR WHEEZING OR SHORTNESS OF BREATH.   Ascorbic Acid (VITAMIN C) 1000 MG tablet Take 1,000 mg by mouth daily.   aspirin 81 MG tablet Take 81 mg by mouth every evening.    atorvastatin (LIPITOR) 40 MG tablet TAKE 1 TABLET EVERY DAY AT 6 PM.   calcium carbonate (OS-CAL) 600 MG TABS Take 600 mg by mouth 2 (two) times daily with a meal.   carbidopa-levodopa (SINEMET IR) 25-100 MG tablet Take 1 tablet by mouth 3 (three) times daily.    cetirizine (ZYRTEC) 10 MG tablet Take 10  mg by mouth daily.    cholecalciferol (VITAMIN D) 25 MCG (1000 UNIT) tablet Take 1 tablet by mouth daily.   fluticasone (FLONASE) 50 MCG/ACT nasal spray Place 2 sprays into both nostrils daily.   furosemide (LASIX) 40 MG tablet Take 1 tablet (40 mg total) by mouth daily.   guaiFENesin-dextromethorphan (ROBITUSSIN DM) 100-10 MG/5ML syrup Take 5 mLs by mouth every 4 (four) hours as needed for cough.   losartan (COZAAR) 100 MG tablet TAKE 1 TABLET EVERY DAY (NEED MD APPOINTMENT FOR REFILLS)   melatonin 5 MG TABS Take 5 mg by mouth at bedtime.   metFORMIN (GLUCOPHAGE) 500 MG tablet Take 500 mg by mouth daily.   metoprolol succinate (TOPROL-XL) 100 MG 24 hr tablet TAKE 1 TABLET EVERY DAY   Misc Natural Products (TART CHERRY ADVANCED) CAPS Take 2 capsules by mouth daily.    Multiple Vitamin (MULTIVITAMIN) tablet Take 1 tablet by mouth daily.     nitroGLYCERIN (NITROSTAT) 0.4 MG SL tablet Place 1 tablet (0.4 mg total) under the tongue every 5 (five) minutes as needed. For chest pain   potassium chloride (KLOR-CON) 10 MEQ tablet Take 1 tablet (10 mEq total) by mouth daily.   Probiotic Product (PHILLIPS COLON HEALTH PO) Take 1 capsule by mouth daily.   vitamin B-12 (CYANOCOBALAMIN) 1000 MCG tablet Take 1,000 mcg by mouth daily.  warfarin (COUMADIN) 10 MG tablet TAKE 1 TABLET DAILY OR AS DIRECTED BY COUMADIN CLINIC   No facility-administered encounter medications on file as of 06/16/2021.    Allergies (verified) Bee venom, Codeine phosphate, Metoprolol tartrate, Pramipexole, Tramadol, and Ramipril   History: Past Medical History:  Diagnosis Date   Acute renal failure ([place]) 02/09/2013   Aortic stenosis 08/25/2010   a. [place] aortic root replacement with a St. Jude mechanical valve and Hemashield conduit 02/2004.   Arthritis    Asthma    AV BLOCK, COMPLETE    a. s/p St Jude dual chamber pacemaker 02/2004.   CAD (coronary artery disease)    a. s/p CABGx2 (LIMA-dLAD, SVG-Cx). b. Low risk nuc 12/2012  without ischemia, EF 46% mild apical hypokinesia (EF 55% inf HK by echo).   Carotid artery disease ([facility])    a. 0-39% bilateral ICA stenosis, stable mild hard plaque in carotid bulbs. F/u 03/2014 recommended.   Diabetes mellitus without complication ([place])    type 2    DIVERTICULOSIS, COLON 10/17/2007   Ejection fraction    a. EF 55% with inf HK, mild MR by echo 12/2012.   GERD (gastroesophageal reflux disease)    hxof    GOUT 01/16/2007   Headache    hx of migraines    HEMORRHOIDS, INTERNAL 11/01/2008   Hx of adenomatous polyp of colon 02/2019   no recall needed   HYPERLIPIDEMIA 01/16/2007   HYPERTENSION 01/16/2007   Impaired glucose tolerance    LEG EDEMA 11/27/2008   Special screening for malignant neoplasm of prostate    Warfarin anticoagulation    AVR   Past Surgical History:  Procedure Laterality Date   AORTIC VALVE REPLACEMENT     CARDIAC CATHETERIZATION  06/2003   CIRCUMCISION     COLONOSCOPY     CORONARY ARTERY BYPASS GRAFT     coronary stents      prior to bypass    EP IMPLANTABLE DEVICE N/A 05/06/2016   Procedure: [place];  Surgeon: Evans Lance, MD;  Location: [facility];  Service: Cardiovascular;  Laterality: N/A;   ESOPHAGOGASTRODUODENOSCOPY     HERNIA REPAIR     INGUINAL HERNIA REPAIR Bilateral 02/07/2013   Procedure: LAPAROSCOPIC BILATERAL INGUINAL HERNIA REPAIR;  Surgeon: Imogene Burn. Georgette Dover, MD;  Location: [facility];  Service: General;  Laterality: Bilateral;   INSERTION OF MESH Bilateral 02/07/2013   Procedure: INSERTION OF MESH;  Surgeon: Imogene Burn. Georgette Dover, MD;  Location: [facility];  Service: General;  Laterality: Bilateral;   LUMBAR LAMINECTOMY/DECOMPRESSION MICRODISCECTOMY N/A 03/15/2018   Procedure: Complete decompressive lumbar laminectomy for spinal stenosis of L4-L5 and foraminotomy for L4-L5 root on right;  Surgeon: [name], MD;  Location: [facility];  Service: Orthopedics;  Laterality: N/A;   PACEMAKER INSERTION     SHOULDER SURGERY Bilateral     SKIN BIOPSY  05/04/2019   BASAL CELL CARCINOMA//MID CENTRAL NASAL   SKIN BIOPSY  10/11/2019   Superficial basal cell carcinoma - left superior central forehead shave   Family History  Problem Relation Age of Onset   Heart attack Father    Kidney cancer Father    Hypertension Mother    Diabetes Mother    Diabetes Brother    Epilepsy Sister    Diabetes Sister    Diabetes Brother    Colon cancer Neg Hx    Rectal cancer Neg Hx    Stomach cancer Neg Hx    Esophageal cancer Neg Hx    Social  History   Socioeconomic History   Marital status: Married    Spouse name: Not on file   Number of children: 3   Years of education: Not on file   Highest education level: Not on file  Occupational History   Occupation: retired  Tobacco Use   Smoking status: Former    Types: Cigarettes    Quit date: 07/12/1973    Years since quitting: 47.9   Smokeless tobacco: Never  Vaping Use   Vaping Use: Never used  Substance and Sexual Activity   Alcohol use: No   Drug use: No   Sexual activity: Not on file  Other Topics Concern   Not on file  Social History Narrative   The patient is married and retired   He reports 3 grown children   Former smoker, no alcohol or tobacco or drug use at this time   Social Determinants of Radio broadcast assistant Strain: Low Risk    Difficulty of Paying Living Expenses: Not hard at all  Food Insecurity: No Food Insecurity   Worried About Charity fundraiser in the Last Year: Never true   Arboriculturist in the Last Year: Never true  Transportation Needs: No Transportation Needs   Lack of Transportation (Medical): No   Lack of Transportation (Non-Medical): No  Physical Activity: Sufficiently Active   Days of Exercise per Week: 5 days   Minutes of Exercise per Session: 60 min  Stress: No Stress Concern Present   Feeling of Stress : Not at all  Social Connections: Socially Integrated   Frequency of Communication with Friends and Family: More than  three times a week   Frequency of Social Gatherings with Friends and Family: More than three times a week   Attends Religious Services: More than 4 times per year   Active Member of [employer] or Organizations: Yes   Attends Music therapist: More than 4 times per year   Marital Status: Married     Clinical Intake: Nutrition Risk Assessment:  Has the patient had any N/V/D within the last 2 months?  No  Does the patient have any non-healing wounds?  No  Has the patient had any unintentional weight loss or weight gain?  No   Diabetes:  Is the patient diabetic?  Yes  If diabetic, was a CBG obtained today?  Yes  BS 97 taken by patient. Did the patient bring in their glucometer from home?  No  Audio Visit How often do you monitor your CBG's? Daily.   Financial Strains and Diabetes Management:  Are you having any financial strains with the device, your supplies or your medication? No .  Does the patient want to be seen by Chronic Care Management for management of their diabetes?  No  Would the patient like to be referred to a Nutritionist or for Diabetic Management?  No  Followed by PCP.  Diabetic Exams:  Diabetic Eye Exam: Completed Yes. [facility] by [facility]  Diabetic Foot Exam: Completed Yes. Pre-visit preparation completed: Yes Followed by PCP.  Diabetic? Yes  Diet: Regular  Activities of Daily Living In your present state of health, do you have any difficulty performing the following activities: 06/16/2021  Hearing? N  Vision? N  Comment Wears glasses. Followed by [facility].  Difficulty concentrating or making decisions? N  Walking or climbing stairs? N  Dressing or bathing? N  Doing errands, shopping? N  Preparing Food and eating ? N  Using the Toilet? N  In the past six months, have you accidently leaked urine? N  Do you have problems with loss of bowel control? N  Managing your Medications? N  Managing your Finances? N  Housekeeping or  managing your Housekeeping? N  Some recent data might be hidden    Patient Care Team: [name], NP as PCP - General (Family Medicine) Jerline Pain, MD as PCP - Cardiology (Cardiology) Evans Lance, MD as PCP - Electrophysiology (Cardiology) [name], MD as Consulting Physician (Orthopedic Surgery) [name], MD as Consulting Physician (Dermatology) Viona Gilmore, [facility] as Pharmacist (Pharmacist)  Indicate any recent Medical Services you may have received from other than [facility] providers in the past year (date may be approximate).     Assessment:   This is a routine wellness examination for Kelvyn.  Virtual Visit via Telephone Note  I connected with  Kebron Pulse Zurawski on 06/16/21 at  1:00 PM EST by telephone and verified that I am speaking with the correct person using two identifiers.  Location: Patient: Home Provider: Office Persons participating in the virtual visit: patient/Nurse Health Advisor   I discussed the limitations, risks, security and privacy concerns of performing an evaluation and management service by telephone and the availability of in person appointments. The patient expressed understanding and agreed to proceed.  Interactive audio and video telecommunications were attempted between this nurse and patient, however failed, due to patient having technical difficulties OR patient did not have access to video capability.  We continued and completed visit with audio only.  Some vital signs may be absent or patient reported.   Criselda Peaches, LPN   Hearing/Vision screen Hearing Screening - Comments:: No difficulty hearing. Vision Screening - Comments:: Wears glasses. Follow[facility] by [facility]  Dietary issues and exercise activities discussed: Current Exercise Habits: Home exercise routine, Type of exercise: walking, Time (Minutes): 30, Frequency (Times/Week): 5, Weekly Exercise (Minutes/Week): 150, Intensity: Moderate   Goals Addressed              This Visit's Progress    Exercise 3x per week (30 min per time)         Depression Screen PHQ 2/9 Scores 06/16/2021 06/11/2020 01/09/2020 10/11/2016 06/07/2016 05/02/2015 04/28/2015  PHQ - 2 Score 0 0 0 0 0 0 0  PHQ- 9 Score - 0 - - - - -    Fall Risk Fall Risk  06/16/2021 06/14/2021 05/07/2021 05/07/2021 06/11/2020  Falls in the past year? 1 1 1 1 1   Number falls in past yr: 1 1 1 1  0  Comment Patient fell injuries to forehead, Rt eye and left shoulder. Followed by PCP - - - -  Injury with Fall? 1 1 1 1 1   Comment Shoulder, rt eye and forehead. Followed by PCP - - - -  Risk for fall due to : - - - - History of fall(s)  Follow up - - - - Falls evaluation completed;Falls prevention discussed    FALL RISK PREVENTION PERTAINING TO THE HOME:  Any stairs in or around the home? Yes  If so, are there any without handrails? No  Home free of loose throw rugs in walkways, pet beds, electrical cords, etc? Yes  Adequate lighting in your home to reduce risk of falls? Yes   ASSISTIVE DEVICES UTILIZED TO PREVENT FALLS:  Life alert? No  Use of a cane, walker or w/c? Yes  Grab bars in the bathroom? Yes  Shower chair  or bench in shower? Yes  Elevated toilet seat or a handicapped toilet? No   TIMED UP AND GO:  Was the test performed? No . Audio Visit  Cognitive Function:   6CIT Screen 06/16/2021  What Year? 0 points  What month? 0 points  What time? 0 points  Count back from 20 0 points  Months in reverse 0 points  Repeat phrase 2 points  Total Score 2   Immunizations Immunization History  Administered Date(s) Administered   Fluad Quad(high Dose 65+) 03/22/2019, 04/01/2020, 04/09/2021   Influenza Split 03/31/2011, 04/10/2012   Influenza Whole 04/04/2008, 05/09/2009, 03/18/2010   Influenza, High Dose Seasonal PF 04/28/2015, 04/06/2016, 04/12/2017, 03/30/2018   Influenza,inj,Quad PF,6+ Mos 04/02/2013   Influenza-Unspecified 04/05/2014   Pneumococcal Conjugate-13  02/13/2014   Pneumococcal Polysaccharide-23 10/07/2011   Tdap 10/07/2011, 07/16/2012   Zoster Recombinat (Shingrix) 10/11/2017, 12/12/2017   Zoster, Live 10/07/2011     Screening Tests Health Maintenance  Topic Date Due   OPHTHALMOLOGY EXAM  06/16/2021 (Originally 06/25/2017)   COVID-19 Vaccine (1) 07/02/2021 (Originally 12/23/1946)   HEMOGLOBIN A1C  10/07/2021   FOOT EXAM  04/09/2022   TETANUS/TDAP  07/16/2022   Pneumonia Vaccine 40+ Years old  Completed   INFLUENZA VACCINE  Completed   Hepatitis C Screening  Completed   Zoster Vaccines- Shingrix  Completed   HPV VACCINES  Aged Out   COLONOSCOPY (Pts 45-68yrs Insurance coverage will need to be confirmed)  Discontinued    Health Maintenance  There are no preventive care reminders to display for this patient.  Additional Screening:  Vision Screening: Recommended annual ophthalmology exams for early detection of glaucoma and other disorders of the eye. Is the patient up to date with their annual eye exam?  Yes  Who is the provider or what is the name of the office in which the patient attends annual eye exams? Followed by [facility].  Dental Screening: Recommended annual dental exams for proper oral hygiene  Community Resource Referral / Chronic Care Management:  CRR required this visit?  No   CCM required this visit?  No      Plan:     I have personally reviewed and noted the following in the patient's chart:   Medical and social history Use of alcohol, tobacco or illicit drugs  Current medications and supplements including opioid prescriptions. Patient is not currently taking opioid prescriptions. Functional ability and status Nutritional status Physical activity Advanced directives List of other physicians Hospitalizations, surgeries, and ER visits in previous 12 months Vitals Screenings to include cognitive, depression, and falls Referrals and appointments  In addition, I have reviewed and  discussed with patient certain preventive protocols, quality metrics, and best practice recommendations. A written personalized care plan for preventive services as well as general preventive health recommendations were provided to patient.     Criselda Peaches, LPN   [id number]

## 2021-06-17 ENCOUNTER — Encounter: Payer: Self-pay | Admitting: Adult Health

## 2021-06-18 MED ORDER — ACCU-CHEK GUIDE W/DEVICE KIT: PACK | 0 refills | Status: DC

## 2021-06-18 MED ORDER — ACCU-CHEK FASTCLIX LANCETS MISC: 2 refills | Status: DC

## 2021-06-18 MED ORDER — ACCU-CHEK SMARTVIEW VI STRP: ORAL_STRIP | 2 refills | Status: DC

## 2021-06-18 NOTE — Telephone Encounter (Signed)
RX sent

## 2021-06-18 NOTE — Telephone Encounter (Signed)
Patient's wife called again to follow up on prescription being sent. I let her know that both messages had been sent, but I would send another message back to let them know that she was following up on it.     Please send to  [place], [place] Phone:  [phone]  Fax:  [phone]

## 2021-06-26 ENCOUNTER — Other Ambulatory Visit: Payer: Self-pay

## 2021-06-26 ENCOUNTER — Ambulatory Visit (INDEPENDENT_AMBULATORY_CARE_PROVIDER_SITE_OTHER): Payer: Medicare HMO

## 2021-06-26 DIAGNOSIS — Z952 Presence of prosthetic heart valve: Secondary | ICD-10-CM | POA: Diagnosis not present

## 2021-06-26 DIAGNOSIS — Z09 Encounter for follow-up examination after completed treatment for conditions other than malignant neoplasm: Secondary | ICD-10-CM

## 2021-06-26 DIAGNOSIS — Z5181 Encounter for therapeutic drug level monitoring: Secondary | ICD-10-CM | POA: Diagnosis not present

## 2021-06-26 LAB — POCT INR: INR: 3 (ref 2.0–3.0)

## 2021-06-26 NOTE — Patient Instructions (Signed)
Description   Continue taking warfarin 1 tablet daily except for 1/2 a tablet on Sundays and Thursdays. Recheck  INR in 4 weeks. [facility]in Clinic [phone]

## 2021-07-01 ENCOUNTER — Telehealth: Payer: Self-pay | Admitting: Pharmacist

## 2021-07-01 NOTE — Chronic Care Management (AMB) (Signed)
Chronic Care Management Pharmacy Assistant   Name: CY BRESEE  MRN: [id number] DOB: 09/02/1945  Reason for Encounter: Disease State / Hypertension Assessment Call   Conditions to be addressed/monitored: HTN  Recent office visits:  None  Recent consult visits:  04/28/2021 Candee Furbish MD (cardiology) - Patient was seen for coronary artery disease and additional issues. No medication changes. Follow up in 1 year.  Hospital visits:  None  Medications: Outpatient Encounter Medications as of 07/01/2021  Medication Sig   Accu-Chek FastClix Lancets MISC USE TO CHECK BLOOD SUGAR TWICE A DAY AND AS NEEDED   acetaminophen (TYLENOL) 650 MG CR tablet Take 1,300 mg by mouth 2 (two) times daily.   albuterol (VENTOLIN HFA) 108 (90 Base) MCG/ACT inhaler INHALE 2 PUFFS INTO THE LUNGS EVERY 6 HOURS AS NEEDED FOR WHEEZING OR SHORTNESS OF BREATH.   Ascorbic Acid (VITAMIN C) 1000 MG tablet Take 1,000 mg by mouth daily.   aspirin 81 MG tablet Take 81 mg by mouth every evening.    atorvastatin (LIPITOR) 40 MG tablet TAKE 1 TABLET EVERY DAY AT 6 PM.   Blood Glucose Monitoring Suppl (ACCU-CHEK GUIDE) w/Device KIT Use as directed   calcium carbonate (OS-CAL) 600 MG TABS Take 600 mg by mouth 2 (two) times daily with a meal.   carbidopa-levodopa (SINEMET IR) 25-100 MG tablet Take 1 tablet by mouth 3 (three) times daily.    cetirizine (ZYRTEC) 10 MG tablet Take 10 mg by mouth daily.    cholecalciferol (VITAMIN D) 25 MCG (1000 UNIT) tablet Take 1 tablet by mouth daily.   fluticasone (FLONASE) 50 MCG/ACT nasal spray Place 2 sprays into both nostrils daily.   furosemide (LASIX) 40 MG tablet Take 1 tablet (40 mg total) by mouth daily.   glucose blood (ACCU-CHEK SMARTVIEW) test strip Use as instructed   guaiFENesin-dextromethorphan (ROBITUSSIN DM) 100-10 MG/5ML syrup Take 5 mLs by mouth every 4 (four) hours as needed for cough.   losartan (COZAAR) 100 MG tablet TAKE 1 TABLET EVERY DAY (NEED MD  APPOINTMENT FOR REFILLS)   melatonin 5 MG TABS Take 5 mg by mouth at bedtime.   metFORMIN (GLUCOPHAGE) 500 MG tablet Take 500 mg by mouth daily.   metoprolol succinate (TOPROL-XL) 100 MG 24 hr tablet TAKE 1 TABLET EVERY DAY   Misc Natural Products (TART CHERRY ADVANCED) CAPS Take 2 capsules by mouth daily.    Multiple Vitamin (MULTIVITAMIN) tablet Take 1 tablet by mouth daily.     nitroGLYCERIN (NITROSTAT) 0.4 MG SL tablet Place 1 tablet (0.4 mg total) under the tongue every 5 (five) minutes as needed. For chest pain   potassium chloride (KLOR-CON) 10 MEQ tablet Take 1 tablet (10 mEq total) by mouth daily.   Probiotic Product (PHILLIPS COLON HEALTH PO) Take 1 capsule by mouth daily.   vitamin B-12 (CYANOCOBALAMIN) 1000 MCG tablet Take 1,000 mcg by mouth daily.   warfarin (COUMADIN) 10 MG tablet TAKE 1 TABLET DAILY OR AS DIRECTED BY COUMADIN CLINIC   No facility-administered encounter medications on file as of 07/01/2021.  Fill History: atorvastatin (LIPITOR) tablet 04/06/2021 90   albuterol (PROVENTIL HFA;VENTOLIN HFA) inhaler 02/03/2021 75   carbidopa-levodopa (SINEMET IR) tablet 25-100 mg 04/11/2021 90   furosemide (LASIX) tablet 04/25/2021 90   losartan (COZAAR) tablet 04/25/2021 90   metoprolol succinate (TOPROL-XL) 24 hr tablet 02/16/2021 90   potassium chloride (K-DUR,KLOR-CON) CR tablet 02/17/2021 90   warfarin (COUMADIN) tablet 03/20/2021 90   metFORMIN (GLUCOPHAGE) tablet 04/25/2021 90   Reviewed  chart prior to disease state call. Spoke with patient regarding BP  Recent Office Vitals: BP Readings from Last 3 Encounters:  06/16/21 137/79  04/28/21 130/70  04/09/21 (!) 160/88   Pulse Readings from Last 3 Encounters:  04/28/21 75  04/09/21 76  01/07/21 72    Wt Readings from Last 3 Encounters:  06/16/21 196 lb 3.2 oz (89 kg)  04/28/21 201 lb 6.4 oz (91.4 kg)  04/09/21 200 lb 12.8 oz (91.1 kg)     Kidney Function Lab Results  Component Value Date/Time    CREATININE 0.97 04/09/2021 09:58 AM   CREATININE 0.80 05/08/2020 10:15 AM   CREATININE 0.87 04/29/2016 10:11 AM   GFR 75.68 04/09/2021 09:58 AM   GFRNONAA 87 05/08/2020 10:15 AM   GFRAA 101 05/08/2020 10:15 AM    BMP Latest Ref Rng & Units 04/09/2021 05/08/2020 01/09/2020  Glucose 70 - 99 mg/dL 93 114(H) 99  BUN 6 - 23 mg/dL 15 14 17   Creatinine 0.40 - 1.50 mg/dL 0.97 0.80 0.83  BUN/Creat Ratio 10 - 24 - 18 -  Sodium 135 - 145 mEq/L 141 142 140  Potassium 3.5 - 5.1 mEq/L 3.9 4.2 4.1  Chloride 96 - 112 mEq/L 104 104 104  CO2 19 - 32 mEq/L 30 25 30   Calcium 8.4 - 10.5 mg/dL 9.5 9.3 9.5    Current antihypertensive regimen:  SINEMET IR 25/100 three times daily Losartan 100 mg daily Metoprolol 100 mg daily  How often are you checking your Blood Pressure? Patient states he is checking his blood pressures every other week.  Current home BP readings: Patient states his most recent reading was 149/79.  What recent interventions/DTPs have been made by any provider to improve Blood Pressure control since last CPP Visit: Patient states no new changes.  Any recent hospitalizations or ED visits since last visit with CPP? No recent hospital visits.  What diet changes have been made to improve Blood Pressure Control?  Patient is eating more greens. For breakfast he will have an egg, bacon, toast, oatmeal, tomato juice and decaf coffee. For lunch he will have a sandwich and for dinner he will have a Meat and Vegetable. Recently he has been eating vegetable soup as he currently had covid.  What exercise is being done to improve your Blood Pressure Control?  Patient is walking his dog 5-6 times daily  Adherence Review: Is the patient currently on ACE/ARB medication? Yes Does the patient have >5 day gap between last estimated fill dates? No  Care Gaps: AWV - scheduled 06/17/2022 Covid vaccine - never done Ophthalmology - overdue  Star Rating Drugs: Atorvastatin 40 mg - last filled  04/06/2021 90DS  Losartan 100 mg - last filled 04/25/2021 90DS  Metformin 500 mg - last filled 04/25/2021 [place] Pharmacist Assistant [phone]

## 2021-07-24 ENCOUNTER — Other Ambulatory Visit: Payer: Self-pay

## 2021-07-24 ENCOUNTER — Ambulatory Visit (INDEPENDENT_AMBULATORY_CARE_PROVIDER_SITE_OTHER): Payer: Medicare HMO

## 2021-07-24 DIAGNOSIS — Z09 Encounter for follow-up examination after completed treatment for conditions other than malignant neoplasm: Secondary | ICD-10-CM

## 2021-07-24 DIAGNOSIS — Z952 Presence of prosthetic heart valve: Secondary | ICD-10-CM

## 2021-07-24 DIAGNOSIS — Z5181 Encounter for therapeutic drug level monitoring: Secondary | ICD-10-CM

## 2021-07-24 DIAGNOSIS — Z7901 Long term (current) use of anticoagulants: Secondary | ICD-10-CM

## 2021-07-24 LAB — POCT INR: INR: 3.3 — AB (ref 2.0–3.0)

## 2021-07-24 NOTE — Patient Instructions (Signed)
Description   Only take 0.5 tablet today and then continue taking warfarin 1 tablet daily except for 1/2 a tablet on Sundays and Thursdays. Recheck INR in 4 weeks. Coumadin Clinic [phone]

## 2021-07-31 DIAGNOSIS — M13861 Other specified arthritis, right knee: Secondary | ICD-10-CM | POA: Diagnosis not present

## 2021-07-31 DIAGNOSIS — M13862 Other specified arthritis, left knee: Secondary | ICD-10-CM | POA: Diagnosis not present

## 2021-08-05 DIAGNOSIS — E119 Type 2 diabetes mellitus without complications: Secondary | ICD-10-CM | POA: Diagnosis not present

## 2021-08-05 LAB — HM DIABETES EYE EXAM

## 2021-08-06 ENCOUNTER — Ambulatory Visit (INDEPENDENT_AMBULATORY_CARE_PROVIDER_SITE_OTHER): Payer: Medicare HMO

## 2021-08-06 DIAGNOSIS — I442 Atrioventricular block, complete: Secondary | ICD-10-CM | POA: Diagnosis not present

## 2021-08-06 LAB — CUP PACEART REMOTE DEVICE CHECK
Battery Remaining Longevity: 65 mo
Battery Remaining Percentage: 52 %
Battery Voltage: 2.98 V
Brady Statistic AP VP Percent: 31 %
Brady Statistic AP VS Percent: 1 %
Brady Statistic AS VP Percent: 69 %
Brady Statistic AS VS Percent: 1 %
Brady Statistic RA Percent Paced: 30 %
Brady Statistic RV Percent Paced: 99 %
Date Time Interrogation Session: 20230126020014
Implantable Lead Implant Date: 20050816
Implantable Lead Implant Date: 20050816
Implantable Lead Location: 753859
Implantable Lead Location: 753860
Implantable Pulse Generator Implant Date: 20171026
Lead Channel Impedance Value: 490 Ohm
Lead Channel Impedance Value: 700 Ohm
Lead Channel Pacing Threshold Amplitude: 0.5 V
Lead Channel Pacing Threshold Amplitude: 1 V
Lead Channel Pacing Threshold Pulse Width: 0.5 ms
Lead Channel Pacing Threshold Pulse Width: 0.5 ms
Lead Channel Sensing Intrinsic Amplitude: 12 mV
Lead Channel Sensing Intrinsic Amplitude: 3 mV
Lead Channel Setting Pacing Amplitude: 0.75 V
Lead Channel Setting Pacing Amplitude: 2 V
Lead Channel Setting Pacing Pulse Width: 0.5 ms
Lead Channel Setting Sensing Sensitivity: 4 mV
Pulse Gen Model: 2272
Pulse Gen Serial Number: 3182792

## 2021-08-07 ENCOUNTER — Encounter: Payer: Self-pay | Admitting: Adult Health

## 2021-08-17 NOTE — Progress Notes (Signed)
Remote pacemaker transmission.   

## 2021-08-21 ENCOUNTER — Other Ambulatory Visit: Payer: Self-pay

## 2021-08-21 ENCOUNTER — Ambulatory Visit (INDEPENDENT_AMBULATORY_CARE_PROVIDER_SITE_OTHER): Payer: Medicare HMO

## 2021-08-21 DIAGNOSIS — Z5181 Encounter for therapeutic drug level monitoring: Secondary | ICD-10-CM

## 2021-08-21 DIAGNOSIS — Z952 Presence of prosthetic heart valve: Secondary | ICD-10-CM

## 2021-08-21 DIAGNOSIS — Z09 Encounter for follow-up examination after completed treatment for conditions other than malignant neoplasm: Secondary | ICD-10-CM | POA: Diagnosis not present

## 2021-08-21 DIAGNOSIS — Z7901 Long term (current) use of anticoagulants: Secondary | ICD-10-CM

## 2021-08-21 LAB — POCT INR: INR: 3.2 — AB (ref 2.0–3.0)

## 2021-08-21 NOTE — Patient Instructions (Signed)
-   take 1/2 tablet tonight, then - START  taking warfarin 1 tablet daily except for 1/2 a tablet on SUNDAYS, [place].  - Recheck INR in 4 weeks.  Coumadin Clinic [phone]

## 2021-08-27 DIAGNOSIS — G20 Parkinson's disease: Secondary | ICD-10-CM | POA: Diagnosis not present

## 2021-08-27 DIAGNOSIS — G629 Polyneuropathy, unspecified: Secondary | ICD-10-CM | POA: Diagnosis not present

## 2021-08-28 ENCOUNTER — Encounter: Payer: Self-pay | Admitting: Cardiology

## 2021-08-28 ENCOUNTER — Other Ambulatory Visit: Payer: Self-pay | Admitting: Adult Health

## 2021-08-28 MED ORDER — NITROGLYCERIN 0.4 MG SL SUBL: 0.4000 mg | SUBLINGUAL_TABLET | SUBLINGUAL | 2 refills | Status: DC | PRN

## 2021-09-01 ENCOUNTER — Ambulatory Visit (INDEPENDENT_AMBULATORY_CARE_PROVIDER_SITE_OTHER): Payer: Medicare HMO | Admitting: Family Medicine

## 2021-09-01 ENCOUNTER — Encounter: Payer: Self-pay | Admitting: Family Medicine

## 2021-09-01 VITALS — BP 128/80 | HR 92 | Temp 98.4°F | Resp 16 | Ht 66.0 in | Wt 201.0 lb

## 2021-09-01 DIAGNOSIS — S6991XA Unspecified injury of right wrist, hand and finger(s), initial encounter: Secondary | ICD-10-CM

## 2021-09-01 DIAGNOSIS — L03113 Cellulitis of right upper limb: Secondary | ICD-10-CM

## 2021-09-01 MED ORDER — SULFAMETHOXAZOLE-TRIMETHOPRIM 800-160 MG PO TABS: 1.0000 | ORAL_TABLET | Freq: Two times a day (BID) | ORAL | 0 refills | Status: AC

## 2021-09-01 NOTE — Progress Notes (Signed)
ACUTE VISIT Chief Complaint  Patient presents with   Possible infected hand    Started to look very red yesterday. Skinned hand at least twice, used band aids to cover.    HPI: [name] is a 76 y.o. right handed male with hx of DM II,CHF,HTN,Parkinson's disease,aortic valve disease s/p mechanical valve replacement on chronic anticoagulation,complete AV block s/p pacemaker placement here today with his wife complaining of edema and erythema right hand for a few weeks. About 2 weeks he hit something accidentally,small skin tear on dorsum of right hand. A few days ago he placed hand under car seat and scratched area.  Hand Injury  The incident occurred more than 1 week ago. The incident occurred at home. The injury mechanism is unknown. The pain is present in the right hand. The quality of the pain is described as aching. The pain does not radiate. The pain is mild. The pain has been Constant since the incident. Pertinent negatives include no chest pain, muscle weakness, numbness or tingling. The symptoms are aggravated by palpation and movement. He has tried nothing for the symptoms.  Intermittent bleeding. Problem is getting worse. Negative for fever,chills,changes in appetite,unusual fatigue, or MS changes.  His wife is applying neosporin.  Review of Systems  Constitutional:  Negative for activity change and chills.  Respiratory:  Negative for cough, shortness of breath and wheezing.   Cardiovascular:  Negative for chest pain.  Gastrointestinal:  Negative for abdominal pain, nausea and vomiting.  Genitourinary:  Negative for decreased urine volume, dysuria and hematuria.  Musculoskeletal:  Positive for gait problem.  Skin:  Positive for wound.  Neurological:  Negative for tingling, syncope and numbness.  Rest see pertinent positives and negatives per HPI.  Current Outpatient Medications on File Prior to Visit  Medication Sig Dispense Refill   Accu-Chek FastClix Lancets  MISC USE TO CHECK BLOOD SUGAR TWICE A DAY AND AS NEEDED 306 each 2   acetaminophen (TYLENOL) 650 MG CR tablet Take 1,300 mg by mouth 2 (two) times daily.     albuterol (VENTOLIN HFA) 108 (90 Base) MCG/ACT inhaler INHALE 2 PUFFS INTO THE LUNGS EVERY 6 HOURS AS NEEDED FOR WHEEZING OR SHORTNESS OF BREATH. 3 each 0   Ascorbic Acid (VITAMIN C) 1000 MG tablet Take 1,000 mg by mouth daily.     aspirin 81 MG tablet Take 81 mg by mouth every evening.      atorvastatin (LIPITOR) 40 MG tablet TAKE 1 TABLET EVERY DAY AT 6 PM. 90 tablet 1   Blood Glucose Monitoring Suppl (ACCU-CHEK GUIDE) w/Device KIT Use as directed 1 kit 0   calcium carbonate (OS-CAL) 600 MG TABS Take 600 mg by mouth 2 (two) times daily with a meal.     carbidopa-levodopa (SINEMET IR) 25-100 MG tablet Take 1 tablet by mouth 3 (three) times daily.      cetirizine (ZYRTEC) 10 MG tablet Take 10 mg by mouth daily.      cholecalciferol (VITAMIN D) 25 MCG (1000 UNIT) tablet Take 1 tablet by mouth daily.     fluticasone (FLONASE) 50 MCG/ACT nasal spray Place 2 sprays into both nostrils daily. 16 g 6   furosemide (LASIX) 40 MG tablet TAKE 1 TABLET EVERY DAY 90 tablet 1   glucose blood (ACCU-CHEK SMARTVIEW) test strip Use as instructed 300 strip 2   guaiFENesin-dextromethorphan (ROBITUSSIN DM) 100-10 MG/5ML syrup Take 5 mLs by mouth every 4 (four) hours as needed for cough.     losartan (COZAAR)  100 MG tablet TAKE 1 TABLET EVERY DAY (NEED MD APPOINTMENT FOR REFILLS) 90 tablet 1   melatonin 5 MG TABS Take 5 mg by mouth at bedtime.     metFORMIN (GLUCOPHAGE) 500 MG tablet Take 500 mg by mouth daily.     metoprolol succinate (TOPROL-XL) 100 MG 24 hr tablet TAKE 1 TABLET EVERY DAY 90 tablet 3   Misc Natural Products (TART CHERRY ADVANCED) CAPS Take 2 capsules by mouth daily.      Multiple Vitamin (MULTIVITAMIN) tablet Take 1 tablet by mouth daily.       nitroGLYCERIN (NITROSTAT) 0.4 MG SL tablet Place 1 tablet (0.4 mg total) under the tongue every 5  (five) minutes as needed. For chest pain 75 tablet 2   potassium chloride (KLOR-CON) 10 MEQ tablet TAKE 1 TABLET EVERY DAY 90 tablet 2   Probiotic Product (PHILLIPS COLON HEALTH PO) Take 1 capsule by mouth daily.     vitamin B-12 (CYANOCOBALAMIN) 1000 MCG tablet Take 1,000 mcg by mouth daily.     warfarin (COUMADIN) 10 MG tablet TAKE 1 TABLET DAILY OR AS DIRECTED BY COUMADIN CLINIC 90 tablet 1   No current facility-administered medications on file prior to visit.   Past Medical History:  Diagnosis Date   Acute renal failure ([place]) 02/09/2013   Aortic stenosis 08/25/2010   a. Bentall aortic root replacement with a St. Jude mechanical valve and Hemashield conduit 02/2004.   Arthritis    Asthma    AV BLOCK, COMPLETE    a. s/p St Jude dual chamber pacemaker 02/2004.   CAD (coronary artery disease)    a. s/p CABGx2 (LIMA-dLAD, SVG-Cx). b. Low risk nuc 12/2012 without ischemia, EF 46% mild apical hypokinesia (EF 55% inf HK by echo).   Carotid artery disease (HCC)    a. 0-39% bilateral ICA stenosis, stable mild hard plaque in carotid bulbs. F/u 03/2014 recommended.   Diabetes mellitus without complication ([place])    type 2    DIVERTICULOSIS, COLON 10/17/2007   Ejection fraction    a. EF 55% with inf HK, mild MR by echo 12/2012.   GERD (gastroesophageal reflux disease)    hxof    GOUT 01/16/2007   Headache    hx of migraines    HEMORRHOIDS, INTERNAL 11/01/2008   Hx of adenomatous polyp of colon 02/2019   no recall needed   HYPERLIPIDEMIA 01/16/2007   HYPERTENSION 01/16/2007   Impaired glucose tolerance    LEG EDEMA 11/27/2008   Special screening for malignant neoplasm of prostate    Warfarin anticoagulation    AVR   Allergies  Allergen Reactions   Bee Venom Shortness Of Breath and Swelling   Codeine Phosphate Hives, Shortness Of Breath, Itching, Rash and Other (See Comments)    Tolerated multiple doses of IV morphine   Metoprolol Tartrate Other (See Comments)    loss of vision. Can take the XL  tablet not regular    Pramipexole Other (See Comments)    Hallucinations     Tramadol     Pt has hallucinations    Ramipril Rash and Cough    Social History   Socioeconomic History   Marital status: Married    Spouse name: Not on file   Number of children: 3   Years of education: Not on file   Highest education level: Not on file  Occupational History   Occupation: retired  Tobacco Use   Smoking status: Former    Types: Cigarettes    Quit date: 07/12/1973  Years since quitting: 48.1   Smokeless tobacco: Never  Vaping Use   Vaping Use: Never used  Substance and Sexual Activity   Alcohol use: No   Drug use: No   Sexual activity: Not on file  Other Topics Concern   Not on file  Social History Narrative   The patient is married and retired   He reports 3 grown children   Former smoker, no alcohol or tobacco or drug use at this time   Social Determinants of Radio broadcast assistant Strain: Low Risk    Difficulty of Paying Living Expenses: Not hard at all  Food Insecurity: No Food Insecurity   Worried About Charity fundraiser in the Last Year: Never true   Arboriculturist in the Last Year: Never true  Transportation Needs: No Transportation Needs   Lack of Transportation (Medical): No   Lack of Transportation (Non-Medical): No  Physical Activity: Sufficiently Active   Days of Exercise per Week: 5 days   Minutes of Exercise per Session: 60 min  Stress: No Stress Concern Present   Feeling of Stress : Not at all  Social Connections: Socially Integrated   Frequency of Communication with Friends and Family: More than three times a week   Frequency of Social Gatherings with Friends and Family: More than three times a week   Attends Religious Services: More than 4 times per year   Active Member of [employer] or Organizations: Yes   Attends Archivist Meetings: More than 4 times per year   Marital Status: Married    Vitals:   09/01/21 1546  BP: 128/80   Pulse: 92  Resp: 16  Temp: 98.4 F (36.9 C)  SpO2: 97%   Body mass index is 32.44 kg/m.  Physical Exam Vitals and nursing note reviewed.  HENT:     Head: Normocephalic and atraumatic.  Eyes:     Conjunctiva/sclera: Conjunctivae normal.  Cardiovascular:     Rate and Rhythm: Normal rate and regular rhythm.     Heart sounds: No murmur heard. Pulmonary:     Effort: Pulmonary effort is normal. No respiratory distress.     Breath sounds: Normal breath sounds.  Musculoskeletal:     Right hand: Tenderness present. Decreased range of motion (Mild MCP joints.). Normal capillary refill. Normal pulse.       Hands:     Comments: Right hand: Edema and erythema extending from dorsum to proximal phalange of fingers.Mild erythema and local heat radial aspect of wrist.   Skin:    General: Skin is warm.     Findings: Abrasion, erythema and wound present.     Comments: See hand graphic.  Neurological:     Mental Status: He is alert and oriented to person, place, and time.     Motor: Tremor present.     Comments: Mildly unstable gait assisted by a cane.   ASSESSMENT AND PLAN:  Mr.Cassie was seen today for possible infected hand.  Diagnoses and all orders for this visit:  Injury of right hand, initial encounter I do not think imaging is needed at this time. Tdap up to date.  Cellulitis of right hand Wound right hand clean with soap and water here in the office. Covered with topical bacitracin oint , non sticky bandage,and wrapped with elastic adherent gauze. Right hand elevation. Instructed to keep wound clean with soap and water 1-2 times per day. Stop neosporin. Cover wound at night. Bactrim DS bid x 7 days.  INR in 48 hours, I sent message to Lisette Abu (coumadin clinic). Clearly instructed about warning signs. F/U in 1 weeks, before if needed.  -     sulfamethoxazole-trimethoprim (BACTRIM DS) 800-160 MG tablet; Take 1 tablet by mouth 2 (two) times daily for 7 days. Return  in about 1 week (around 09/08/2021).  Rudene Poulsen G. [place], MD  [facility]. [place] office.

## 2021-09-01 NOTE — Patient Instructions (Addendum)
A few things to remember from today's visit:  Cellulitis of right hand - Plan: sulfamethoxazole-trimethoprim (BACTRIM DS) 800-160 MG tablet  Do not use My Chart to request refills or for acute issues that need immediate attention.   Keep area clean with antibacterial soap and water, cover at night. Hand elevation. Bactrim for infection. INR/coumadin needs to be checked in 2 days.  Please be sure medication list is accurate. If a new problem present, please set up appointment sooner than planned today.

## 2021-09-04 ENCOUNTER — Ambulatory Visit (INDEPENDENT_AMBULATORY_CARE_PROVIDER_SITE_OTHER): Payer: Medicare HMO | Admitting: *Deleted

## 2021-09-04 ENCOUNTER — Other Ambulatory Visit: Payer: Self-pay

## 2021-09-04 DIAGNOSIS — Z09 Encounter for follow-up examination after completed treatment for conditions other than malignant neoplasm: Secondary | ICD-10-CM

## 2021-09-04 DIAGNOSIS — Z952 Presence of prosthetic heart valve: Secondary | ICD-10-CM

## 2021-09-04 DIAGNOSIS — Z7901 Long term (current) use of anticoagulants: Secondary | ICD-10-CM | POA: Diagnosis not present

## 2021-09-04 DIAGNOSIS — Z5181 Encounter for therapeutic drug level monitoring: Secondary | ICD-10-CM

## 2021-09-04 LAB — POCT INR: INR: 2.9 (ref 2.0–3.0)

## 2021-09-04 NOTE — Patient Instructions (Signed)
Description   -Take 1/2 a tablet of warfarin today and tomorrow -Then continue  taking warfarin 1 tablet daily except for 1/2 a tablet on SUNDAYS, [place].  - Recheck INR in 1 week while on bactrim (should finish bactrim on 2/28) Coumadin Clinic [phone]

## 2021-09-08 ENCOUNTER — Ambulatory Visit (INDEPENDENT_AMBULATORY_CARE_PROVIDER_SITE_OTHER): Payer: Medicare HMO | Admitting: Adult Health

## 2021-09-08 ENCOUNTER — Encounter: Payer: Self-pay | Admitting: Adult Health

## 2021-09-08 VITALS — BP 130/70 | HR 80 | Temp 98.4°F | Ht 66.0 in | Wt 203.0 lb

## 2021-09-08 DIAGNOSIS — L03113 Cellulitis of right upper limb: Secondary | ICD-10-CM | POA: Diagnosis not present

## 2021-09-08 MED ORDER — DOXYCYCLINE HYCLATE 100 MG PO CAPS: 100.0000 mg | ORAL_CAPSULE | Freq: Two times a day (BID) | ORAL | 0 refills | Status: DC

## 2021-09-08 NOTE — Progress Notes (Signed)
Subjective:    Patient ID: [name], male    DOB: 1945-09-04, 76 y.o.   MRN: [id number]  HPI 76 year old male who  has a past medical history of Acute renal failure ([place]) (02/09/2013), Aortic stenosis (08/25/2010), Arthritis, Asthma, AV BLOCK, COMPLETE, CAD (coronary artery disease), Carotid artery disease ([place]), Diabetes mellitus without complication ([place]), DIVERTICULOSIS, COLON (10/17/2007), Ejection fraction, GERD (gastroesophageal reflux disease), GOUT (01/16/2007), Headache, HEMORRHOIDS, INTERNAL (11/01/2008), adenomatous polyp of colon (02/2019), HYPERLIPIDEMIA (01/16/2007), HYPERTENSION (01/16/2007), Impaired glucose tolerance, LEG EDEMA (11/27/2008), Special screening for malignant neoplasm of prostate, and Warfarin anticoagulation.  He was seen by another provider roughly 1 week ago complaining of edema and erythema to the right hand for a few weeks.  About 2 weeks prior he hit something accidentally causing a small skin tear on the dorsum of his right hand.  3 days later he placed his hand under a car seat and scratched the area.  On exam he had edema and erythema extending from dorsum to the proximal phalange of the fingers.  Mild erythema and localized to the radial aspect of his wrist.  He was diagnosed with cellulitis prescribed Bactrim DS twice daily x7 days.  Today he is with his wife, reports that he has one day of antibiotics left. He continues to have pain, redness, and warmth with minimal purulent drainage noted to the dorsum of his right hand but appears improved.   Localized erythema to radial wrist has resolved   Review of Systems See HPI   Past Medical History:  Diagnosis Date   Acute renal failure ([place]) 02/09/2013   Aortic stenosis 08/25/2010   a. Bentall aortic root replacement with a St. Jude mechanical valve and Hemashield conduit 02/2004.   Arthritis    Asthma    AV BLOCK, COMPLETE    a. s/p St Jude dual chamber pacemaker 02/2004.   CAD (coronary artery disease)    a.  s/p CABGx2 (LIMA-dLAD, SVG-Cx). b. Low risk nuc 12/2012 without ischemia, EF 46% mild apical hypokinesia (EF 55% inf HK by echo).   Carotid artery disease (HCC)    a. 0-39% bilateral ICA stenosis, stable mild hard plaque in carotid bulbs. F/u 03/2014 recommended.   Diabetes mellitus without complication ([place])    type 2    DIVERTICULOSIS, COLON 10/17/2007   Ejection fraction    a. EF 55% with inf HK, mild MR by echo 12/2012.   GERD (gastroesophageal reflux disease)    hxof    GOUT 01/16/2007   Headache    hx of migraines    HEMORRHOIDS, INTERNAL 11/01/2008   Hx of adenomatous polyp of colon 02/2019   no recall needed   HYPERLIPIDEMIA 01/16/2007   HYPERTENSION 01/16/2007   Impaired glucose tolerance    LEG EDEMA 11/27/2008   Special screening for malignant neoplasm of prostate    Warfarin anticoagulation    AVR    Social History   Socioeconomic History   Marital status: Married    Spouse name: Not on file   Number of children: 3   Years of education: Not on file   Highest education level: Not on file  Occupational History   Occupation: retired  Tobacco Use   Smoking status: Former    Types: Cigarettes    Quit date: 07/12/1973    Years since quitting: 48.1   Smokeless tobacco: Never  Vaping Use   Vaping Use: Never used  Substance and Sexual Activity   Alcohol use: No   Drug  use: No   Sexual activity: Not on file  Other Topics Concern   Not on file  Social History Narrative   The patient is married and retired   He reports 3 grown children   Former smoker, no alcohol or tobacco or drug use at this time   Social Determinants of Radio broadcast assistant Strain: Low Risk    Difficulty of Paying Living Expenses: Not hard at all  Food Insecurity: No Food Insecurity   Worried About Charity fundraiser in the Last Year: Never true   Arboriculturist in the Last Year: Never true  Transportation Needs: No Transportation Needs   Lack of Transportation (Medical): No   Lack of  Transportation (Non-Medical): No  Physical Activity: Sufficiently Active   Days of Exercise per Week: 5 days   Minutes of Exercise per Session: 60 min  Stress: No Stress Concern Present   Feeling of Stress : Not at all  Social Connections: Socially Integrated   Frequency of Communication with Friends and Family: More than three times a week   Frequency of Social Gatherings with Friends and Family: More than three times a week   Attends Religious Services: More than 4 times per year   Active Member of Clubs or Organizations: Yes   Attends Music therapist: More than 4 times per year   Marital Status: Married  Human resources officer Violence: Not At Risk   Fear of Current or Ex-Partner: No   Emotionally Abused: No   Physically Abused: No   Sexually Abused: No    Past Surgical History:  Procedure Laterality Date   AORTIC VALVE REPLACEMENT     CARDIAC CATHETERIZATION  06/2003   CIRCUMCISION     COLONOSCOPY     CORONARY ARTERY BYPASS GRAFT     coronary stents      prior to bypass    EP IMPLANTABLE DEVICE N/A 05/06/2016   Procedure: [place];  Surgeon: Evans Lance, MD;  Location: [place] [facility];  Service: Cardiovascular;  Laterality: N/A;   ESOPHAGOGASTRODUODENOSCOPY     HERNIA REPAIR     INGUINAL HERNIA REPAIR Bilateral 02/07/2013   Procedure: LAPAROSCOPIC BILATERAL INGUINAL HERNIA REPAIR;  Surgeon: Imogene Burn. Georgette Dover, MD;  Location: [facility];  Service: General;  Laterality: Bilateral;   INSERTION OF MESH Bilateral 02/07/2013   Procedure: INSERTION OF MESH;  Surgeon: Imogene Burn. Georgette Dover, MD;  Location: [facility];  Service: General;  Laterality: Bilateral;   LUMBAR LAMINECTOMY/DECOMPRESSION MICRODISCECTOMY N/A 03/15/2018   Procedure: Complete decompressive lumbar laminectomy for spinal stenosis of L4-L5 and foraminotomy for L4-L5 root on right;  Surgeon: [name], MD;  Location: [facility];  Service: Orthopedics;  Laterality: N/A;   PACEMAKER INSERTION     SHOULDER  SURGERY Bilateral    SKIN BIOPSY  05/04/2019   BASAL CELL CARCINOMA//MID CENTRAL NASAL   SKIN BIOPSY  10/11/2019   Superficial basal cell carcinoma - left superior central forehead shave    Family History  Problem Relation Age of Onset   Heart attack Father    Kidney cancer Father    Hypertension Mother    Diabetes Mother    Diabetes Brother    Epilepsy Sister    Diabetes Sister    Diabetes Brother    Colon cancer Neg Hx    Rectal cancer Neg Hx    Stomach cancer Neg Hx    Esophageal cancer Neg Hx     Allergies  Allergen Reactions  Bee Venom Shortness Of Breath and Swelling   Codeine Phosphate Hives, Shortness Of Breath, Itching, Rash and Other (See Comments)    Tolerated multiple doses of IV morphine   Metoprolol Tartrate Other (See Comments)    loss of vision. Can take the XL tablet not regular    Pramipexole Other (See Comments)    Hallucinations     Tramadol     Pt has hallucinations    Ramipril Rash and Cough    Current Outpatient Medications on File Prior to Visit  Medication Sig Dispense Refill   Accu-Chek FastClix Lancets MISC USE TO CHECK BLOOD SUGAR TWICE A DAY AND AS NEEDED 306 each 2   acetaminophen (TYLENOL) 650 MG CR tablet Take 1,300 mg by mouth 2 (two) times daily.     albuterol (VENTOLIN HFA) 108 (90 Base) MCG/ACT inhaler INHALE 2 PUFFS INTO THE LUNGS EVERY 6 HOURS AS NEEDED FOR WHEEZING OR SHORTNESS OF BREATH. 3 each 0   Ascorbic Acid (VITAMIN C) 1000 MG tablet Take 1,000 mg by mouth daily.     aspirin 81 MG tablet Take 81 mg by mouth every evening.      atorvastatin (LIPITOR) 40 MG tablet TAKE 1 TABLET EVERY DAY AT 6 PM. 90 tablet 1   Blood Glucose Monitoring Suppl (ACCU-CHEK GUIDE) w/Device KIT Use as directed 1 kit 0   calcium carbonate (OS-CAL) 600 MG TABS Take 600 mg by mouth 2 (two) times daily with a meal.     carbidopa-levodopa (SINEMET IR) 25-100 MG tablet Take 1 tablet by mouth 3 (three) times daily.      cetirizine (ZYRTEC) 10 MG tablet  Take 10 mg by mouth daily.      cholecalciferol (VITAMIN D) 25 MCG (1000 UNIT) tablet Take 1 tablet by mouth daily.     fluticasone (FLONASE) 50 MCG/ACT nasal spray Place 2 sprays into both nostrils daily. 16 g 6   furosemide (LASIX) 40 MG tablet TAKE 1 TABLET EVERY DAY 90 tablet 1   glucose blood (ACCU-CHEK SMARTVIEW) test strip Use as instructed 300 strip 2   guaiFENesin-dextromethorphan (ROBITUSSIN DM) 100-10 MG/5ML syrup Take 5 mLs by mouth every 4 (four) hours as needed for cough.     losartan (COZAAR) 100 MG tablet TAKE 1 TABLET EVERY DAY (NEED MD APPOINTMENT FOR REFILLS) 90 tablet 1   melatonin 5 MG TABS Take 5 mg by mouth at bedtime.     metFORMIN (GLUCOPHAGE) 500 MG tablet Take 500 mg by mouth daily.     metoprolol succinate (TOPROL-XL) 100 MG 24 hr tablet TAKE 1 TABLET EVERY DAY 90 tablet 3   Misc Natural Products (TART CHERRY ADVANCED) CAPS Take 2 capsules by mouth daily.      Multiple Vitamin (MULTIVITAMIN) tablet Take 1 tablet by mouth daily.       nitroGLYCERIN (NITROSTAT) 0.4 MG SL tablet Place 1 tablet (0.4 mg total) under the tongue every 5 (five) minutes as needed. For chest pain 75 tablet 2   potassium chloride (KLOR-CON) 10 MEQ tablet TAKE 1 TABLET EVERY DAY 90 tablet 2   Probiotic Product (PHILLIPS COLON HEALTH PO) Take 1 capsule by mouth daily.     sulfamethoxazole-trimethoprim (BACTRIM DS) 800-160 MG tablet Take 1 tablet by mouth 2 (two) times daily for 7 days. 14 tablet 0   vitamin B-12 (CYANOCOBALAMIN) 1000 MCG tablet Take 1,000 mcg by mouth daily.     warfarin (COUMADIN) 10 MG tablet TAKE 1 TABLET DAILY OR AS DIRECTED BY COUMADIN CLINIC 90 tablet  1   No current facility-administered medications on file prior to visit.    There were no vitals taken for this visit.      Objective:   Physical Exam Vitals and nursing note reviewed.  Constitutional:      Appearance: Normal appearance.  Cardiovascular:     Rate and Rhythm: Normal rate and regular rhythm.     Heart  sounds: Normal heart sounds.  Musculoskeletal:       Hands:     Comments: On exam he had continued mild edema and  retreating erythema extending from dorsum to the proximal phalange of the fingers. Some minimal blood drainage noted.    Skin:    Capillary Refill: Capillary refill takes less than 2 seconds.  Neurological:     General: No focal deficit present.     Mental Status: He is alert and oriented to person, place, and time.  Psychiatric:        Mood and Affect: Mood normal.        Behavior: Behavior normal.        Thought Content: Thought content normal.        Judgment: Judgment normal.      Assessment & Plan:   1. Cellulitis of right hand - Will switch antibiotics for coverage against MRSA.  - Keep wound clean and wrap with bandage during the day  - Follow up in 7 days for reevaluation  - doxycycline (VIBRAMYCIN) 100 MG capsule; Take 1 capsule (100 mg total) by mouth 2 (two) times daily.  Dispense: 20 capsule; Refill: 0   [name], NP

## 2021-09-09 ENCOUNTER — Other Ambulatory Visit: Payer: Self-pay

## 2021-09-09 ENCOUNTER — Ambulatory Visit (INDEPENDENT_AMBULATORY_CARE_PROVIDER_SITE_OTHER): Payer: Medicare HMO

## 2021-09-09 DIAGNOSIS — Z5181 Encounter for therapeutic drug level monitoring: Secondary | ICD-10-CM

## 2021-09-09 DIAGNOSIS — Z7901 Long term (current) use of anticoagulants: Secondary | ICD-10-CM

## 2021-09-09 DIAGNOSIS — Z09 Encounter for follow-up examination after completed treatment for conditions other than malignant neoplasm: Secondary | ICD-10-CM | POA: Diagnosis not present

## 2021-09-09 DIAGNOSIS — Z952 Presence of prosthetic heart valve: Secondary | ICD-10-CM | POA: Diagnosis not present

## 2021-09-09 LAB — POCT INR: INR: 2.5 (ref 2.0–3.0)

## 2021-09-09 NOTE — Patient Instructions (Signed)
Description   ?-Take 1/2 a tablet of warfarin today, tomorrow, and Friday  ?-Then continue taking warfarin 1 tablet daily except for 1/2 a tablet on SUNDAYS, [place].  ?- Recheck INR in 1 week while on Doxy (should complete on 09/18/21)  ?Coumadin Clinic [phone] ?  ?   ?

## 2021-09-15 ENCOUNTER — Ambulatory Visit (INDEPENDENT_AMBULATORY_CARE_PROVIDER_SITE_OTHER): Payer: Medicare HMO | Admitting: Adult Health

## 2021-09-15 ENCOUNTER — Encounter: Payer: Self-pay | Admitting: Adult Health

## 2021-09-15 VITALS — BP 140/80 | HR 85 | Temp 98.0°F | Ht 66.0 in | Wt 203.0 lb

## 2021-09-15 DIAGNOSIS — L03113 Cellulitis of right upper limb: Secondary | ICD-10-CM

## 2021-09-15 NOTE — Progress Notes (Signed)
° °Subjective:  ° ° Patient ID: [name], male    DOB: 11/23/1945, 76 y.o.   MRN: [id number] ° °HPI ° °76-year-old male who is being evaluated today for 1 week follow-up regarding cellulitis on his right hand.  He was originally seen by another provider in the office after a skin tear on the dorsum of his right hand became infected.  At this time he was prescribed Bactrim DS twice daily x7 days.  When I saw him last week he was finishing up his antibiotic course but continued to have pain, redness, and warmth with some minimal purulent drainage noted from area of infection. ° °We extended his antibiotic course with doxycycline twice daily x10 days. I had him dress with his xeroform and kerlex.  ° °Today he and his wife reports significant improvement. No longer having any drainage swelling, redness, or warmth. Wound has healed completely.  ° °Review of Systems °See HPI  ° °Past Medical History:  °Diagnosis Date  ° Acute renal failure (HCC) 02/09/2013  ° Aortic stenosis 08/25/2010  ° a. [place] aortic root replacement with a St. Jude mechanical valve and Hemashield conduit 02/2004.  ° Arthritis   ° Asthma   ° AV BLOCK, COMPLETE   ° a. s/p St Jude dual chamber pacemaker 02/2004.  ° CAD (coronary artery disease)   ° a. s/p CABGx2 (LIMA-dLAD, SVG-Cx). b. Low risk nuc 12/2012 without ischemia, EF 46% mild apical hypokinesia (EF 55% inf HK by echo).  ° Carotid artery disease (HCC)   ° a. 0-39% bilateral ICA stenosis, stable mild hard plaque in carotid bulbs. F/u 03/2014 recommended.  ° Diabetes mellitus without complication (HCC)   ° type 2   ° DIVERTICULOSIS, COLON 10/17/2007  ° Ejection fraction   ° a. EF 55% with inf HK, mild MR by echo 12/2012.  ° GERD (gastroesophageal reflux disease)   ° hxof   ° GOUT 01/16/2007  ° Headache   ° hx of migraines   ° HEMORRHOIDS, INTERNAL 11/01/2008  ° Hx of adenomatous polyp of colon 02/2019  ° no recall needed  ° HYPERLIPIDEMIA 01/16/2007  ° HYPERTENSION 01/16/2007  ° Impaired glucose  tolerance   ° LEG EDEMA 11/27/2008  ° Special screening for malignant neoplasm of prostate   ° Warfarin anticoagulation   ° AVR  ° ° °Social History  ° °Socioeconomic History  ° Marital status: Married  °  Spouse name: Not on file  ° Number of children: 3  ° Years of education: Not on file  ° Highest education level: Not on file  °Occupational History  ° Occupation: retired  °Tobacco Use  ° Smoking status: Former  °  Types: Cigarettes  °  Quit date: 07/12/1973  °  Years since quitting: 48.2  ° Smokeless tobacco: Never  °Vaping Use  ° Vaping Use: Never used  °Substance and Sexual Activity  ° Alcohol use: No  ° Drug use: No  ° Sexual activity: Not on file  °Other Topics Concern  ° Not on file  °Social History Narrative  ° The patient is married and retired  ° He reports 3 grown children  ° Former smoker, no alcohol or tobacco or drug use at this time  ° °Social Determinants of Health  ° °Financial Resource Strain: Low Risk   ° Difficulty of Paying Living Expenses: Not hard at all  °Food Insecurity: No Food Insecurity  ° Worried About Running Out of Food in the Last Year: Never true  ° Ran   Ran Out of Food in the Last Year: Never true  Transportation Needs: No Transportation Needs   Lack of Transportation (Medical): No   Lack of Transportation (Non-Medical): No  Physical Activity: Unknown   Days of Exercise per Week: Patient refused   Minutes of Exercise per Session: 60 min  Stress: No Stress Concern Present   Feeling of Stress : Not at all  Social Connections: Socially Integrated   Frequency of Communication with Friends and Family: More than three times a week   Frequency of Social Gatherings with Friends and Family: More than three times a week   Attends Religious Services: More than 4 times per year   Active Member of Clubs or Organizations: No   Attends Music therapist: More than 4 times per year   Marital Status: Married  Human resources officer Violence: Not At Risk   Fear of Current or  Ex-Partner: No   Emotionally Abused: No   Physically Abused: No   Sexually Abused: No    Past Surgical History:  Procedure Laterality Date   AORTIC VALVE REPLACEMENT     CARDIAC CATHETERIZATION  06/2003   CIRCUMCISION     COLONOSCOPY     CORONARY ARTERY BYPASS GRAFT     coronary stents      prior to bypass    EP IMPLANTABLE DEVICE N/A 05/06/2016   Procedure: [place];  Surgeon: Evans Lance, MD;  Location: [place] [facility];  Service: Cardiovascular;  Laterality: N/A;   ESOPHAGOGASTRODUODENOSCOPY     HERNIA REPAIR     INGUINAL HERNIA REPAIR Bilateral 02/07/2013   Procedure: LAPAROSCOPIC BILATERAL INGUINAL HERNIA REPAIR;  Surgeon: Imogene Burn. Georgette Dover, MD;  Location: [facility];  Service: General;  Laterality: Bilateral;   INSERTION OF MESH Bilateral 02/07/2013   Procedure: INSERTION OF MESH;  Surgeon: Imogene Burn. Georgette Dover, MD;  Location: [facility];  Service: General;  Laterality: Bilateral;   LUMBAR LAMINECTOMY/DECOMPRESSION MICRODISCECTOMY N/A 03/15/2018   Procedure: Complete decompressive lumbar laminectomy for spinal stenosis of L4-L5 and foraminotomy for L4-L5 root on right;  Surgeon: [name], MD;  Location: [facility];  Service: Orthopedics;  Laterality: N/A;   PACEMAKER INSERTION     SHOULDER SURGERY Bilateral    SKIN BIOPSY  05/04/2019   BASAL CELL CARCINOMA//MID CENTRAL NASAL   SKIN BIOPSY  10/11/2019   Superficial basal cell carcinoma - left superior central forehead shave    Family History  Problem Relation Age of Onset   Heart attack Father    Kidney cancer Father    Hypertension Mother    Diabetes Mother    Diabetes Brother    Epilepsy Sister    Diabetes Sister    Diabetes Brother    Colon cancer Neg Hx    Rectal cancer Neg Hx    Stomach cancer Neg Hx    Esophageal cancer Neg Hx     Allergies  Allergen Reactions   Bee Venom Shortness Of Breath and Swelling   Codeine Phosphate Hives, Shortness Of Breath, Itching, Rash and Other (See Comments)     Tolerated multiple doses of IV morphine   Metoprolol Tartrate Other (See Comments)    loss of vision. Can take the XL tablet not regular    Pramipexole Other (See Comments)    Hallucinations     Tramadol     Pt has hallucinations    Ramipril Rash and Cough    Current Outpatient Medications on File Prior to Visit  Medication Sig Dispense Refill  Accu-Chek FastClix Lancets MISC USE TO CHECK BLOOD SUGAR TWICE A DAY AND AS NEEDED 306 each 2  ° acetaminophen (TYLENOL) 650 MG CR tablet Take 1,300 mg by mouth 2 (two) times daily.    ° albuterol (VENTOLIN HFA) 108 (90 Base) MCG/ACT inhaler INHALE 2 PUFFS INTO THE LUNGS EVERY 6 HOURS AS NEEDED FOR WHEEZING OR SHORTNESS OF BREATH. 3 each 0  ° Ascorbic Acid (VITAMIN C) 1000 MG tablet Take 1,000 mg by mouth daily.    ° aspirin 81 MG tablet Take 81 mg by mouth every evening.     ° atorvastatin (LIPITOR) 40 MG tablet TAKE 1 TABLET EVERY DAY AT 6 PM. 90 tablet 1  ° Blood Glucose Monitoring Suppl (ACCU-CHEK GUIDE) w/Device KIT Use as directed 1 kit 0  ° calcium carbonate (OS-CAL) 600 MG TABS Take 600 mg by mouth 2 (two) times daily with a meal.    ° carbidopa-levodopa (SINEMET IR) 25-100 MG tablet Take 1 tablet by mouth 3 (three) times daily.     ° cetirizine (ZYRTEC) 10 MG tablet Take 10 mg by mouth daily.     ° cholecalciferol (VITAMIN D) 25 MCG (1000 UNIT) tablet Take 1 tablet by mouth daily.    ° doxycycline (VIBRAMYCIN) 100 MG capsule Take 1 capsule (100 mg total) by mouth 2 (two) times daily. 20 capsule 0  ° fluticasone (FLONASE) 50 MCG/ACT nasal spray Place 2 sprays into both nostrils daily. 16 g 6  ° furosemide (LASIX) 40 MG tablet TAKE 1 TABLET EVERY DAY 90 tablet 1  ° glucose blood (ACCU-CHEK SMARTVIEW) test strip Use as instructed 300 strip 2  ° guaiFENesin-dextromethorphan (ROBITUSSIN DM) 100-10 MG/5ML syrup Take 5 mLs by mouth every 4 (four) hours as needed for cough.    ° losartan (COZAAR) 100 MG tablet TAKE 1 TABLET EVERY DAY (NEED MD APPOINTMENT FOR  REFILLS) 90 tablet 1  ° melatonin 5 MG TABS Take 5 mg by mouth at bedtime.    ° metFORMIN (GLUCOPHAGE) 500 MG tablet Take 500 mg by mouth daily.    ° metoprolol succinate (TOPROL-XL) 100 MG 24 hr tablet TAKE 1 TABLET EVERY DAY 90 tablet 3  ° Misc Natural Products (TART CHERRY ADVANCED) CAPS Take 2 capsules by mouth daily.     ° Multiple Vitamin (MULTIVITAMIN) tablet Take 1 tablet by mouth daily.      ° nitroGLYCERIN (NITROSTAT) 0.4 MG SL tablet Place 1 tablet (0.4 mg total) under the tongue every 5 (five) minutes as needed. For chest pain 75 tablet 2  ° potassium chloride (KLOR-CON) 10 MEQ tablet TAKE 1 TABLET EVERY DAY 90 tablet 2  ° Probiotic Product (PHILLIPS COLON HEALTH PO) Take 1 capsule by mouth daily.    ° vitamin B-12 (CYANOCOBALAMIN) 1000 MCG tablet Take 1,000 mcg by mouth daily.    ° warfarin (COUMADIN) 10 MG tablet TAKE 1 TABLET DAILY OR AS DIRECTED BY COUMADIN CLINIC 90 tablet 1  ° °No current facility-administered medications on file prior to visit.  ° ° °BP 140/80    Pulse 85    Temp 98 °F (36.7 °C) (Oral)    Ht 5' 6" (1.676 m)    Wt 203 lb (92.1 kg)    SpO2 97%    BMI 32.77 kg/m²  ° ° °   °Objective:  ° Physical Exam °Vitals and nursing note reviewed.  °Constitutional:   °   Appearance: Normal appearance.  °Cardiovascular:  °   Rate and Rhythm: Normal rate and regular rhythm.  °     Pulses: Normal pulses.     Heart sounds: Normal heart sounds.  Skin:    General: Skin is warm and dry.     Capillary Refill: Capillary refill takes less than 2 seconds.     Comments: Cellulitis has resolved completely. Has discoloration to the dorsal aspect of his hand from wound itself.   Neurological:     General: No focal deficit present.     Mental Status: He is alert and oriented to person, place, and time.  Psychiatric:        Mood and Affect: Mood normal.        Behavior: Behavior normal.        Thought Content: Thought content normal.      Assessment & Plan:  1. Cellulitis of right hand -  Resolved - Will have him finish out his last 2 days of abx  - Can keep hand undressed - Follow up as needed  [name], NP

## 2021-09-16 ENCOUNTER — Ambulatory Visit (INDEPENDENT_AMBULATORY_CARE_PROVIDER_SITE_OTHER): Payer: Medicare HMO | Admitting: *Deleted

## 2021-09-16 ENCOUNTER — Other Ambulatory Visit: Payer: Self-pay

## 2021-09-16 DIAGNOSIS — Z952 Presence of prosthetic heart valve: Secondary | ICD-10-CM | POA: Diagnosis not present

## 2021-09-16 DIAGNOSIS — Z5181 Encounter for therapeutic drug level monitoring: Secondary | ICD-10-CM

## 2021-09-16 DIAGNOSIS — Z09 Encounter for follow-up examination after completed treatment for conditions other than malignant neoplasm: Secondary | ICD-10-CM

## 2021-09-16 LAB — POCT INR: INR: 1.8 — AB (ref 2.0–3.0)

## 2021-09-16 NOTE — Patient Instructions (Signed)
Description   ?Today take 1.5 tablets then continue taking warfarin 1 tablet daily except for 1/2 a tablet on SUNDAYS, [place]. Resume normal leafy veggies and remain consistent. Recheck INR in 2 weeks. [facility] [phone] ?  ?  ?

## 2021-09-22 ENCOUNTER — Other Ambulatory Visit: Payer: Self-pay | Admitting: Cardiology

## 2021-09-22 ENCOUNTER — Other Ambulatory Visit: Payer: Self-pay | Admitting: Adult Health

## 2021-09-30 ENCOUNTER — Other Ambulatory Visit: Payer: Self-pay

## 2021-09-30 ENCOUNTER — Ambulatory Visit (INDEPENDENT_AMBULATORY_CARE_PROVIDER_SITE_OTHER): Payer: Medicare HMO | Admitting: *Deleted

## 2021-09-30 DIAGNOSIS — Z09 Encounter for follow-up examination after completed treatment for conditions other than malignant neoplasm: Secondary | ICD-10-CM | POA: Diagnosis not present

## 2021-09-30 DIAGNOSIS — Z952 Presence of prosthetic heart valve: Secondary | ICD-10-CM | POA: Diagnosis not present

## 2021-09-30 DIAGNOSIS — Z5181 Encounter for therapeutic drug level monitoring: Secondary | ICD-10-CM | POA: Diagnosis not present

## 2021-09-30 LAB — POCT INR: INR: 2.3 (ref 2.0–3.0)

## 2021-09-30 NOTE — Patient Instructions (Signed)
Description   Continue taking warfarin 1 tablet daily except for 1/2 tablet on SUNDAYS, TUESDAYS AND THURSDAYS. Resume normal leafy veggies and remain consistent. Recheck INR in 3 weeks. [facility] [phone]     

## 2021-10-02 ENCOUNTER — Other Ambulatory Visit: Payer: Self-pay | Admitting: Internal Medicine

## 2021-10-02 ENCOUNTER — Other Ambulatory Visit: Payer: Self-pay | Admitting: Adult Health

## 2021-10-13 DIAGNOSIS — H903 Sensorineural hearing loss, bilateral: Secondary | ICD-10-CM | POA: Diagnosis not present

## 2021-10-13 DIAGNOSIS — H9313 Tinnitus, bilateral: Secondary | ICD-10-CM | POA: Diagnosis not present

## 2021-10-16 ENCOUNTER — Telehealth: Payer: Medicare HMO

## 2021-10-16 ENCOUNTER — Telehealth: Payer: Self-pay | Admitting: Pharmacist

## 2021-10-16 NOTE — Chronic Care Management (AMB) (Signed)
? ? ?  Chronic Care Management ?Pharmacy Assistant  ? ?Name: [name]  MRN: [id number] DOB: 1946/05/25 ? ?10/20/2021 APPOINTMENT REMINDER ? ? ?[name], No answer, left message of appointment on 10/20/2021 at 1:00 via telephone visit with [name], Pharm D. Notified to have all medications, supplements, blood pressure and/or blood sugar logs available during appointment and to return call if need to reschedule. ? ?Care Gaps: ?AWV - scheduled 06/17/2022 ?Last BP - 140/80 on 09/15/2021 ?Last A1C - 6.4 on 04/09/2021 ?Covid vaccine - never done ?HGA1C - overdue ?  ?Star Rating Drugs: ?Atorvastatin 40 mg - last filled 08/28/2021 90DS at [facility] ?Losartan 100 mg - last filled 09/22/2021 90DS at [facility] ?Metformin 500 mg - last filled 10/02/2021 90DS at [facility] ? ? ?Any gaps in medications fill history? No ? ?Gennie Alma CMA  ?Clinical Pharmacist Assistant ?[phone] ? ?

## 2021-10-20 ENCOUNTER — Ambulatory Visit (INDEPENDENT_AMBULATORY_CARE_PROVIDER_SITE_OTHER): Payer: Medicare HMO | Admitting: Pharmacist

## 2021-10-20 DIAGNOSIS — I10 Essential (primary) hypertension: Secondary | ICD-10-CM

## 2021-10-20 DIAGNOSIS — E119 Type 2 diabetes mellitus without complications: Secondary | ICD-10-CM

## 2021-10-20 NOTE — Progress Notes (Signed)
? ?Chronic Care Management ?Pharmacy Note ? ?10/20/2021 ?Name:  [name] MRN:  [id number] DOB:  05-22-1946 ? ?Summary: ?BP not at goal < 130/80 per recent office readings ?LDL at goal < 70 ?A1c at goal < 7% ?  ?Recommendations/Changes made from today's visit: ?-Continued BP monitoring at home and recommended bring BP cuff to cardiology appt next week to verify accuracy ?-Recommended saline nasal spray for congestion ?-Scheduled DM follow up with PCP ?  ?Plan: ?BP assessment in 3 months ?Follow up in 6 months  ? ?Subjective: ?[name] is an 76 y.o. year old male who is a primary patient of Nafziger, Tommi Rumps, NP.  The CCM team was consulted for assistance with disease management and care coordination needs.   ? ?Engaged with patient by telephone for follow up visit in response to provider referral for pharmacy case management and/or care coordination services.  ? ?Consent to Services:  ?The patient was given information about Chronic Care Management services, agreed to services, and gave verbal consent prior to initiation of services.  Please see initial visit note for detailed documentation.  ? ?Patient Care Team: ?[name], NP as PCP - General (Family Medicine) ?Jerline Pain, MD as PCP - Cardiology (Cardiology) ?Evans Lance, MD as PCP - Electrophysiology (Cardiology) ?[name], MD as Consulting Physician (Orthopedic Surgery) ?[name], MD as Consulting Physician (Dermatology) ?Viona Gilmore, [facility] as Pharmacist (Pharmacist) ? ?Recent office visits: ?09/15/21 [name], NP: Patient presented for cellulitis of right hand. Plan to finish out antibiotics. ? ?09/08/21 [name], NP: Patient presented for cellulitis of right hand. Prescribed doxycycline for MRSA coverage. ? ?09/01/21 Betty Martinique, MD: Patient presented for right hand injury and infection. Prescribed Bactrim DS. ? ?06/16/21 Rolene Arbour, LPN: Patient presented for AWV. ? ?Recent consult visits: ?10/13/21 Ebbie Latus, DO (ENT): Patient presented for initial consult for tinnitus and hearing loss. D/c'd fish oil.  ? ?10/13/21 Johnnye Sima, AuD (ENT): Patient presented for hearing evaluation and tympanograms. ? ?09/30/21 Derrel Nip, RN (cardiology): Patient presented for anti-coag visit. INR 2.3, goal 2-3. Continued warfarin 5 mg (10 mg x 0.5) every Tues, Thurs and Sun; 10 mg (10 mg x 1) all other days. Recheck in 3 weeks. ? ?08/05/21 Madelin Headings (optometry): Patient presented for eye exam. ? ?04/28/2021 Candee Furbish MD (cardiology) - Patient was seen for coronary artery disease and additional issues. No medication changes. Follow up in 1 year. ? ?01/23/20 Melody Combs, RN (Neurology): Pt reduced dose of Sinemet to 25/100 mg to 1 tab TID and is feeling much better without any nausea and symptom control.  ? ?11/06/19 Griselda Miner (Dermatology): history of malignant neoplasm of skin, basal cell carcinoma of other parts of face diagnosis. ? ?Hospital visits: ?None in previous 6 months ? ?Objective: ? ?Lab Results  ?Component Value Date  ? CREATININE 0.97 04/09/2021  ? BUN 15 04/09/2021  ? GFR 75.68 04/09/2021  ? GFRNONAA 87 05/08/2020  ? GFRAA 101 05/08/2020  ? NA 141 04/09/2021  ? K 3.9 04/09/2021  ? CALCIUM 9.5 04/09/2021  ? CO2 30 04/09/2021  ? GLUCOSE 93 04/09/2021  ? ? ?Lab Results  ?Component Value Date/Time  ? HGBA1C 6.4 04/09/2021 09:58 AM  ? HGBA1C 5.9 (A) 01/07/2021 10:34 AM  ? HGBA1C 6.1 (A) 06/23/2020 01:43 PM  ? HGBA1C 6.2 01/09/2020 10:42 AM  ? HGBA1C 5.8 10/16/2019 01:10 PM  ? HGBA1C 6.0 09/28/2018 08:22 AM  ? GFR 75.68 04/09/2021 09:58 AM  ? GFR 90.19  01/09/2020 10:42 AM  ? MICROALBUR 0.8 10/11/2017 09:05 AM  ? MICROALBUR <0.7 10/11/2016 01:27 PM  ?  ?Last diabetic Eye exam:  ?Lab Results  ?Component Value Date/Time  ? HMDIABEYEEXA No Retinopathy 08/05/2021 10:36 AM  ?  ?Last diabetic Foot exam: No results found for: HMDIABFOOTEX  ? ?Lab Results  ?Component Value Date  ? CHOL 142 04/09/2021  ? HDL 54.00  04/09/2021  ? [place] 63 04/09/2021  ? TRIG 124.0 04/09/2021  ? CHOLHDL 3 04/09/2021  ? ? ? ?  Latest Ref Rng & Units 04/09/2021  ?  9:58 AM 01/09/2020  ? 10:42 AM 12/28/2018  ? 10:04 AM  ?Hepatic Function  ?Total Protein 6.0 - 8.3 g/dL 6.2   6.2   5.9    ?Albumin 3.5 - 5.2 g/dL 4.2   4.4   4.1    ?AST 0 - 37 U/L _0 ?ALT 0 - 53 U/L _1 ?Alk Phosphatase 39 - 117 U/L 45   49   51    ?Total Bilirubin 0.2 - 1.2 mg/dL 0.8   0.7   0.5    ? ? ?Lab Results  ?Component Value Date/Time  ? TSH 1.74 04/09/2021 09:58 AM  ? TSH 1.72 01/09/2020 10:42 AM  ? ? ? ?  Latest Ref Rng & Units 04/09/2021  ?  9:58 AM 01/09/2020  ? 10:42 AM 12/28/2018  ? 10:04 AM  ?CBC  ?WBC 4.0 - 10.5 K/uL 9.1   7.5   8.7    ?Hemoglobin 13.0 - 17.0 g/dL 14.4   13.9   13.9    ?Hematocrit 39.0 - 52.0 % 44.1   41.9   42.3    ?Platelets 150.0 - 400.0 K/uL 200.0   197.0   213.0    ? ? ?No results found for: VD25OH ? ?Clinical ASCVD: Yes  ?The 10-year ASCVD risk score (Arnett DK, et al., 2019) is: 51.4% ?  Values used to calculate the score: ?    Age: 55 years ?    Sex: Male ?    Is Non-Hispanic African American: No ?    Diabetic: Yes ?    Tobacco smoker: No ?    Systolic Blood Pressure: 707 mmHg ?    Is BP treated: Yes ?    HDL Cholesterol: 54 mg/dL ?    Total Cholesterol: 142 mg/dL   ? ? ?  06/16/2021  ?  1:02 PM 06/11/2020  ? 11:38 AM 01/09/2020  ?  9:43 AM  ?Depression screen PHQ 2/9  ?Decreased Interest 0 0 0  ?Down, Depressed, Hopeless 0 0 0  ?PHQ - 2 Score 0 0 0  ?Altered sleeping  0   ?Tired, decreased energy  0   ?Change in appetite  0   ?Feeling bad or failure about yourself   0   ?Trouble concentrating  0   ?Moving slowly or fidgety/restless  0   ?Suicidal thoughts  0   ?PHQ-9 Score  0   ?Difficult doing work/chores  Not difficult at all   ?  ? ? ?Social History  ? ?Tobacco Use  ?Smoking Status Former  ? Types: Cigarettes  ? Quit date: 07/12/1973  ? Years since quitting: 48.3  ?Smokeless Tobacco Never  ? ?BP Readings from Last 3  Encounters:  ?09/15/21 140/80  ?09/08/21 130/70  ?09/01/21 128/80  ? ?Pulse Readings from Last 3 Encounters:  ?09/15/21 85  ?  09/08/21 80  ?09/01/21 92  ? ?Wt Readings from Last 3 Encounters:  ?09/15/21 203 lb (92.1 kg)  ?09/08/21 203 lb (92.1 kg)  ?09/01/21 201 lb (91.2 kg)  ? ?BMI Readings from Last 3 Encounters:  ?09/15/21 32.77 kg/m?  ?09/08/21 32.77 kg/m?  ?09/01/21 32.44 kg/m?  ? ? ?Assessment/Interventions: Review of patient past medical history, allergies, medications, health status, including review of consultants reports, laboratory and other test data, was performed as part of comprehensive evaluation and provision of chronic care management services.  ? ?SDOH:  (Social Determinants of Health) assessments and interventions performed: No ? ?SDOH Screenings  ? ?Alcohol Screen: Not on file  ?Depression (PHQ2-9): Low Risk   ? PHQ-2 Score: 0  ?Financial Resource Strain: Low Risk   ? Difficulty of Paying Living Expenses: Not hard at all  ?Food Insecurity: No Food Insecurity  ? Worried About Charity fundraiser in the Last Year: Never true  ? Ran Out of Food in the Last Year: Never true  ?Housing: Low Risk   ? Last Housing Risk Score: 0  ?Physical Activity: Unknown  ? Days of Exercise per Week: Patient refused  ? Minutes of Exercise per Session: 60 min  ?Social Connections: Socially Integrated  ? Frequency of Communication with Friends and Family: More than three times a week  ? Frequency of Social Gatherings with Friends and Family: More than three times a week  ? Attends Religious Services: More than 4 times per year  ? Active Member of Clubs or Organizations: No  ? Attends Archivist Meetings: More than 4 times per year  ? Marital Status: Married  ?Stress: No Stress Concern Present  ? Feeling of Stress : Not at all  ?Tobacco Use: Medium Risk  ? Smoking Tobacco Use: Former  ? Smokeless Tobacco Use: Never  ? Passive Exposure: Not on file  ?Transportation Needs: No Transportation Needs  ? Lack of  Transportation (Medical): No  ? Lack of Transportation (Non-Medical): No  ? ? ?CCM Care Plan ? ?Allergies  ?Allergen Reactions  ? Bee Venom Shortness Of Breath and Swelling  ? Codeine Phosphate Hives, Shortness Of Bre

## 2021-10-20 NOTE — Patient Instructions (Signed)
Hi [name] and [name], ? ?It was great to catch up with you both! Keep up the good work with taking care of yourself and keeping track of taking your blood pressures and blood sugars regularly. Hopefully your A1c in a couple of weeks will be still good even with the change in metformin! ? ?Please reach out to me if you have any questions or need anything before our follow up! ? ?Best, ?Maddie ? ?[name], PharmD, BCACP ?Clinical Pharmacist ?Therapist, music at [facility] ?[phone] ? ? Visit Information ? ? Goals Addressed   ?None ?  ? ?Patient Care Plan: [name]  ?  ? ?Problem Identified: Problem: Hypertension, Hyperlipidemia, Diabetes, Heart Failure, Coronary Artery Disease, Asthma, Osteoarthritis, Gout, Allergic Rhinitis and Aortic valve replacement and Parkinson's disease   ?  ? ?Long-Range Goal: Patient-Specific Goal   ?Start Date: 10/20/2020  ?Expected End Date: 10/20/2021  ?Recent Progress: On track  ?Priority: High  ?Note:   ?Current Barriers:  ?Unable to independently monitor therapeutic efficacy ? ?Pharmacist Clinical Goal(s):  ?Patient will achieve adherence to monitoring guidelines and medication adherence to achieve therapeutic efficacy through collaboration with PharmD and provider.  ? ?Interventions: ?1:1 collaboration with [name], NP regarding development and update of comprehensive plan of care as evidenced by provider attestation and co-signature ?Inter-disciplinary care team collaboration (see longitudinal plan of care) ?Comprehensive medication review performed; medication list updated in electronic medical record ? ?Hypertension (BP goal <130/80) ?-Not ideally controlled ?-Current treatment: ?Losartan 100 mg daily - in PM - Appropriate, Effective, Safe, Accessible ?Metoprolol succinate 100 mg daily - in AM - Appropriate, Effective, Safe, Accessible ?-Medications previously tried: ramipril (rash, cough) ?-Current home readings: 110/58 HR 66, 125/65 HR 60; checking  it a couple times a day ?-Current dietary habits: did not discuss ?-Current exercise habits: walking dog about 4-5 times a day ?-Reports hypotensive/hypertensive symptoms ?-Educated on Daily salt intake goal < 2300 mg; ?Importance of home blood pressure monitoring; ?Proper BP monitoring technique; ?-Counseled to monitor BP at home at least weekly, document, and provide log at future appointments ?-Counseled on diet and exercise extensively ?Recommended to continue current medication ? ?Hyperlipidemia: (LDL goal < 70) ?-Controlled ?-Current treatment: ?Atorvastatin 40 mg daily at 6pm - Appropriate, Effective, Safe, Accessible ?-Medications previously tried: pravastatin (switched to high intensity atorvastatin), rosuvastatin (cost), fish oil  ?-Current dietary patterns: eating more chicken and fish; one serving of greens per week; no fried foods ?-Current exercise habits: walking dog ?-Educated on Cholesterol goals;  ?Benefits of statin for ASCVD risk reduction; ?-Counseled on diet and exercise extensively ?Recommended to continue current medication ? ?CAD (Goal: prevent heart attacks and strokes) ?-Controlled ?-Current treatment  ?Nitroglycerin 0.4 mg as needed for chest pain - Appropriate, Effective, Safe, Accessible ?Atorvastatin 40 mg daily - Appropriate, Effective, Safe, Accessible ?Aspirin 81 mg daily - Appropriate, Effective, Safe, Accessible ?-Medications previously tried: none  ?-Recommended to continue current medication ? ?Aortic valve replacement (Goal: prevent clot formation) ?-Controlled ?-Current treatment  ?Warfarin 5 mg every Sun and 10 mg all other days - Appropriate, Effective, Safe, Accessible ?-Medications previously tried: none  ?-Counseled on monitoring for signs of bleeding such as unexplained and excessive bleeding from a cut or injury, easy or excessive bruising, blood in urine or stools, and nosebleeds without a known cause ? ? ?Diabetes (A1c goal <7%) ?-Controlled ?-Current  medications: ?rrent medications: ?Metformin 500 mg 1 tablet daily - Appropriate, Effective, Safe, Accessible ?-Medications previously tried: glipizide (weakness/shakiness with borderline hypoglycemia) ?-Current home glucose  readings: checks twice weekly  ?fasting glucose: 114, 130 (normal) ?post prandial glucose: none ?-Denies hypoglycemic/hyperglycemic symptoms ?-Current meal patterns:  ?breakfast: one slice of bread ?lunch: did not discuss  ?dinner: did not discuss  ?snacks: not a lot of sweets ?drinks: water, occasional tea or decaf coffee with artificial sweetener ?-Current exercise: takes dog out about 4-5 times a day and walks the yard every time ?-Educated on Exercise goal of 150 minutes per week; ?Carbohydrate counting and/or plate method ?-Counseled to check feet daily and get yearly eye exams ?-Counseled on diet and exercise extensively ?Recommended to continue current medication ? ?Heart Failure (Goal: manage symptoms and prevent exacerbations) ?-Controlled ?-Last ejection fraction: 55%  ?-HF type: Diastolic ?-NYHA Class: II (slight limitation of activity) ?-AHA HF Stage: B (Heart disease present - no symptoms present) ?-Current treatment: ?Furosemide 40 mg once daily - Appropriate, Effective, Safe, Accessible ?Metoprolol succinate 100 mg daily - Appropriate, Effective, Safe, Accessible ?Potassium chloride 10 MEQ once daily - Appropriate, Effective, Safe, Accessible ?-Medications previously tried: none  ?-Current home BP/HR readings: 120-130s ?-Current dietary habits: limits salt intake ?-Current exercise habits: walks dog daily ?-Educated on Importance of weighing daily; if you gain more than 3 pounds in one day or 5 pounds in one week, call cardiologist ?Importance of blood pressure control ?-Counseled on diet and exercise extensively ?Recommended to continue current medication ? ?Parkinson's disease (Goal: minimize symptoms) ?-Controlled ?-Current treatment  ?Sinemet 25/100 mg, 1 tablet three times  daily - Appropriate, Effective, Safe, Accessible ?-Medications previously tried: Sinemet 4 times daily (hallucinations and GI upset), Requip (ankle swelling), Mirapex (hallucinations) ?-Recommended to continue current medication ? ?Asthma (Goal: control symptoms) ?-Controlled ?-Current treatment  ?Albuterol two puffs every 6 hours as needed for wheezing or shortness of breath - Appropriate, Effective, Safe, Accessible ?-Medications previously tried: none  ?-Pulmonary function testing: none ?-Patient denies consistent use of maintenance inhaler ?-Frequency of rescue inhaler use: using it once a week; gets worse during spring time and fall ?-Counseled on When to use rescue inhaler ?Differences between maintenance and rescue inhalers ?-Recommended to continue current medication ?Consider Symbicort prescription if patient uses albuterol more frequently. ? ?Allergic rhinitis (Goal: minimize symptoms) ?-Controlled ?-Current treatment  ?Cetirizine 10 mg daily (morning) - Appropriate, Effective, Safe, Accessible ?Flonase 2 sprays into both nostrils daily as needed - Appropriate, Effective, Safe, Accessible ?-Medications previously tried: none  ?-Recommended to continue current medication ? ?Gout (Goal: prevent flare ups) ?-Controlled ?-Current treatment  ?Tart cherry two tablets once daily - Appropriate, Effective, Safe, Accessible ?-Medications previously tried: none  ?-Recommended to continue current medication ? ?Osteoarthritis (Goal: minimize pain) ?-Controlled ?-Current treatment  ?Acetaminophen '650mg'$ , 2 tablets twice daily - Appropriate, Effective, Safe, Accessible ?-Medications previously tried: none  ?-Recommended to continue current medication ? ?Health Maintenance ?-Vaccine gaps: COVID vaccines (patient declined vaccine) ?-Current therapy:  ?Vitamin C 1000 mg once daily  ?Vitamin B12 1000 mg daily (morning)  ?Phillips colon health daily (breakfast) ?Multivitamin daily (morning)  ?Calcium carbonate 600 mg twice  daily with meals ?Vitamin D 1000 units, 1 tablet once daily  ?-Educated on Cost vs benefit of each product must be carefully weighed by individual consumer ?-Patient is satisfied with current therapy and denies issues ?-Re

## 2021-10-21 ENCOUNTER — Ambulatory Visit (INDEPENDENT_AMBULATORY_CARE_PROVIDER_SITE_OTHER): Payer: Medicare HMO | Admitting: *Deleted

## 2021-10-21 DIAGNOSIS — Z5181 Encounter for therapeutic drug level monitoring: Secondary | ICD-10-CM | POA: Diagnosis not present

## 2021-10-21 DIAGNOSIS — Z952 Presence of prosthetic heart valve: Secondary | ICD-10-CM

## 2021-10-21 DIAGNOSIS — Z09 Encounter for follow-up examination after completed treatment for conditions other than malignant neoplasm: Secondary | ICD-10-CM

## 2021-10-21 LAB — POCT INR: INR: 2 (ref 2.0–3.0)

## 2021-10-21 NOTE — Patient Instructions (Signed)
Description   ?Continue taking warfarin 1 tablet daily except for 1/2 tablet on SUNDAYS, [place]. Resume normal leafy veggies and remain consistent. Recheck INR in 4 weeks. [facility] [phone] ?  ? ? ?

## 2021-10-29 DIAGNOSIS — M13862 Other specified arthritis, left knee: Secondary | ICD-10-CM | POA: Diagnosis not present

## 2021-10-29 DIAGNOSIS — M13861 Other specified arthritis, right knee: Secondary | ICD-10-CM | POA: Diagnosis not present

## 2021-11-04 ENCOUNTER — Ambulatory Visit (INDEPENDENT_AMBULATORY_CARE_PROVIDER_SITE_OTHER): Payer: Medicare HMO | Admitting: Adult Health

## 2021-11-04 ENCOUNTER — Encounter: Payer: Self-pay | Admitting: Adult Health

## 2021-11-04 VITALS — BP 140/80 | HR 74 | Temp 98.1°F | Ht 66.0 in | Wt 203.0 lb

## 2021-11-04 DIAGNOSIS — E119 Type 2 diabetes mellitus without complications: Secondary | ICD-10-CM

## 2021-11-04 DIAGNOSIS — I10 Essential (primary) hypertension: Secondary | ICD-10-CM | POA: Diagnosis not present

## 2021-11-04 LAB — POCT GLYCOSYLATED HEMOGLOBIN (HGB A1C): Hemoglobin A1C: 6 % — AB (ref 4.0–5.6)

## 2021-11-04 NOTE — Progress Notes (Signed)
? ?Subjective:  ? ? Patient ID: [name], male    DOB: 12-12-1945, 76 y.o.   MRN: [id number] ? ?HPI ? ?76 year old male who  has a past medical history of Acute renal failure ([place]) (02/09/2013), Aortic stenosis (08/25/2010), Arthritis, Asthma, AV BLOCK, COMPLETE, CAD (coronary artery disease), Carotid artery disease ([place]), Diabetes mellitus without complication ([place]), DIVERTICULOSIS, COLON (10/17/2007), Ejection fraction, GERD (gastroesophageal reflux disease), GOUT (01/16/2007), Headache, HEMORRHOIDS, INTERNAL (11/01/2008), adenomatous polyp of colon (02/2019), HYPERLIPIDEMIA (01/16/2007), HYPERTENSION (01/16/2007), Impaired glucose tolerance, LEG EDEMA (11/27/2008), Special screening for malignant neoplasm of prostate, and Warfarin anticoagulation. ? ?He presents to the office today for 6 month follow up regarding DM and HTN  ? ?DM Type 2 -managed with metformin 500 mg once daily.   He does on occasion check his blood sugars at home with readings in the 120 or below.  He denies symptoms of hypoglycemia.  He is up-to-date on routine eye exams. ? ?Lab Results  ?Component Value Date  ? HGBA1C 6.4 04/09/2021  ? ?HTN -managed with losartan 100 mg daily, Lasix 40 mg daily, and Toprol 100 mg daily.  He denies dizziness, lightheadedness, chest pain, or shortness of breath. ?BP Readings from Last 3 Encounters:  ?11/04/21 140/80  ?09/15/21 140/80  ?09/08/21 130/70  ? ?Review of Systems ?See HPI  ? ?Past Medical History:  ?Diagnosis Date  ? Acute renal failure ([place]) 02/09/2013  ? Aortic stenosis 08/25/2010  ? a. [place] aortic root replacement with a St. Jude mechanical valve and Hemashield conduit 02/2004.  ? Arthritis   ? Asthma   ? AV BLOCK, COMPLETE   ? a. s/p St Jude dual chamber pacemaker 02/2004.  ? CAD (coronary artery disease)   ? a. s/p CABGx2 (LIMA-dLAD, SVG-Cx). b. Low risk nuc 12/2012 without ischemia, EF 46% mild apical hypokinesia (EF 55% inf HK by echo).  ? Carotid artery disease ([place])   ? a. 0-39% bilateral ICA  stenosis, stable mild hard plaque in carotid bulbs. F/u 03/2014 recommended.  ? Diabetes mellitus without complication ([place])   ? type 2   ? DIVERTICULOSIS, COLON 10/17/2007  ? Ejection fraction   ? a. EF 55% with inf HK, mild MR by echo 12/2012.  ? GERD (gastroesophageal reflux disease)   ? hxof   ? GOUT 01/16/2007  ? Headache   ? hx of migraines   ? HEMORRHOIDS, INTERNAL 11/01/2008  ? Hx of adenomatous polyp of colon 02/2019  ? no recall needed  ? HYPERLIPIDEMIA 01/16/2007  ? HYPERTENSION 01/16/2007  ? Impaired glucose tolerance   ? LEG EDEMA 11/27/2008  ? Special screening for malignant neoplasm of prostate   ? Warfarin anticoagulation   ? AVR  ? ? ?Social History  ? ?Socioeconomic History  ? Marital status: Married  ?  Spouse name: Not on file  ? Number of children: 3  ? Years of education: Not on file  ? Highest education level: Not on file  ?Occupational History  ? Occupation: retired  ?Tobacco Use  ? Smoking status: Former  ?  Types: Cigarettes  ?  Quit date: 07/12/1973  ?  Years since quitting: 48.3  ? Smokeless tobacco: Never  ?Vaping Use  ? Vaping Use: Never used  ?Substance and Sexual Activity  ? Alcohol use: No  ? Drug use: No  ? Sexual activity: Not on file  ?Other Topics Concern  ? Not on file  ?Social History Narrative  ? The patient is married and retired  ? He reports  3 grown children  ? Former smoker, no alcohol or tobacco or drug use at this time  ? ?Social Determinants of Health  ? ?Financial Resource Strain: Low Risk   ? Difficulty of Paying Living Expenses: Not hard at all  ?Food Insecurity: No Food Insecurity  ? Worried About Charity fundraiser in the Last Year: Never true  ? Ran Out of Food in the Last Year: Never true  ?Transportation Needs: No Transportation Needs  ? Lack of Transportation (Medical): No  ? Lack of Transportation (Non-Medical): No  ?Physical Activity: Unknown  ? Days of Exercise per Week: Patient refused  ? Minutes of Exercise per Session: 60 min  ?Stress: No Stress Concern Present  ?  Feeling of Stress : Not at all  ?Social Connections: Socially Integrated  ? Frequency of Communication with Friends and Family: More than three times a week  ? Frequency of Social Gatherings with Friends and Family: More than three times a week  ? Attends Religious Services: More than 4 times per year  ? Active Member of Clubs or Organizations: No  ? Attends Archivist Meetings: More than 4 times per year  ? Marital Status: Married  ?Intimate Partner Violence: Not At Risk  ? Fear of Current or Ex-Partner: No  ? Emotionally Abused: No  ? Physically Abused: No  ? Sexually Abused: No  ? ? ?Past Surgical History:  ?Procedure Laterality Date  ? AORTIC VALVE REPLACEMENT    ? CARDIAC CATHETERIZATION  06/2003  ? CIRCUMCISION    ? COLONOSCOPY    ? CORONARY ARTERY BYPASS GRAFT    ? coronary stents     ? prior to bypass   ? EP IMPLANTABLE DEVICE N/A 05/06/2016  ? Procedure: PPM Generator Changeout;  Surgeon: Evans Lance, MD;  Location: [facility];  Service: Cardiovascular;  Laterality: N/A;  ? ESOPHAGOGASTRODUODENOSCOPY    ? HERNIA REPAIR    ? INGUINAL HERNIA REPAIR Bilateral 02/07/2013  ? Procedure: LAPAROSCOPIC BILATERAL INGUINAL HERNIA REPAIR;  Surgeon: Imogene Burn. Georgette Dover, MD;  Location: [facility];  Service: General;  Laterality: Bilateral;  ? INSERTION OF MESH Bilateral 02/07/2013  ? Procedure: INSERTION OF MESH;  Surgeon: Imogene Burn. Georgette Dover, MD;  Location: [facility];  Service: General;  Laterality: Bilateral;  ? LUMBAR LAMINECTOMY/DECOMPRESSION MICRODISCECTOMY N/A 03/15/2018  ? Procedure: Complete decompressive lumbar laminectomy for spinal stenosis of L4-L5 and foraminotomy for L4-L5 root on right;  Surgeon: [name], MD;  Location: [facility];  Service: Orthopedics;  Laterality: N/A;  ? PACEMAKER INSERTION    ? SHOULDER SURGERY Bilateral   ? SKIN BIOPSY  05/04/2019  ? BASAL CELL CARCINOMA//MID CENTRAL NASAL  ? SKIN BIOPSY  10/11/2019  ? Superficial basal cell carcinoma - left superior central forehead shave   ? ? ?Family History  ?Problem Relation Age of Onset  ? Heart attack Father   ? Kidney cancer Father   ? Hypertension Mother   ? Diabetes Mother   ? Diabetes Brother   ? Epilepsy Sister   ? Diabetes Sister   ? Diabetes Brother   ? Colon cancer Neg Hx   ? Rectal cancer Neg Hx   ? Stomach cancer Neg Hx   ? Esophageal cancer Neg Hx   ? ? ?Allergies  ?Allergen Reactions  ? Bee Venom Shortness Of Breath and Swelling  ? Codeine Phosphate Hives, Shortness Of Breath, Itching, Rash and Other (See Comments)  ?  Tolerated multiple doses of IV morphine  ? Metoprolol  Tartrate Other (See Comments)  ?  loss of vision. Can take the XL tablet not regular   ? Pramipexole Other (See Comments)  ?  Hallucinations  ?  ? Tramadol   ?  Pt has hallucinations   ? Ramipril Rash and Cough  ? ? ?Current Outpatient Medications on File Prior to Visit  ?Medication Sig Dispense Refill  ? Accu-Chek FastClix Lancets MISC USE TO CHECK BLOOD SUGAR TWICE A DAY AND AS NEEDED 306 each 2  ? acetaminophen (TYLENOL) 650 MG CR tablet Take 1,300 mg by mouth 2 (two) times daily.    ? albuterol (VENTOLIN HFA) 108 (90 Base) MCG/ACT inhaler INHALE 2 PUFFS INTO THE LUNGS EVERY 6 HOURS AS NEEDED FOR WHEEZING OR SHORTNESS OF BREATH. 3 each 0  ? Ascorbic Acid (VITAMIN C) 1000 MG tablet Take 1,000 mg by mouth daily.    ? aspirin 81 MG tablet Take 81 mg by mouth every evening.     ? atorvastatin (LIPITOR) 40 MG tablet TAKE 1 TABLET EVERY DAY AT 6 PM. 90 tablet 1  ? Blood Glucose Monitoring Suppl (ACCU-CHEK GUIDE) w/Device KIT Use as directed 1 kit 0  ? calcium carbonate (OS-CAL) 600 MG TABS Take 600 mg by mouth 2 (two) times daily with a meal.    ? carbidopa-levodopa (SINEMET CR) 50-200 MG tablet Take 1 tablet by mouth at bedtime.    ? carbidopa-levodopa (SINEMET IR) 25-100 MG tablet Take 1 tablet by mouth 3 (three) times daily.     ? cetirizine (ZYRTEC) 10 MG tablet Take 10 mg by mouth daily.     ? cholecalciferol (VITAMIN D) 25 MCG (1000 UNIT) tablet Take 1 tablet by  mouth daily.    ? fluticasone (FLONASE) 50 MCG/ACT nasal spray Place 2 sprays into both nostrils daily. 16 g 6  ? furosemide (LASIX) 40 MG tablet TAKE 1 TABLET EVERY DAY 90 tablet 1  ? glucose blood (ACCU-CHEK SMA

## 2021-11-05 ENCOUNTER — Ambulatory Visit (INDEPENDENT_AMBULATORY_CARE_PROVIDER_SITE_OTHER): Payer: Medicare HMO

## 2021-11-05 DIAGNOSIS — I442 Atrioventricular block, complete: Secondary | ICD-10-CM

## 2021-11-05 LAB — CUP PACEART REMOTE DEVICE CHECK
Battery Remaining Longevity: 61 mo
Battery Remaining Percentage: 50 %
Battery Voltage: 2.98 V
Brady Statistic AP VP Percent: 29 %
Brady Statistic AP VS Percent: 1 %
Brady Statistic AS VP Percent: 70 %
Brady Statistic AS VS Percent: 1 %
Brady Statistic RA Percent Paced: 29 %
Brady Statistic RV Percent Paced: 99 %
Date Time Interrogation Session: 20230427020013
Implantable Lead Implant Date: 20050816
Implantable Lead Implant Date: 20050816
Implantable Lead Location: 753859
Implantable Lead Location: 753860
Implantable Pulse Generator Implant Date: 20171026
Lead Channel Impedance Value: 480 Ohm
Lead Channel Impedance Value: 700 Ohm
Lead Channel Pacing Threshold Amplitude: 0.5 V
Lead Channel Pacing Threshold Amplitude: 0.875 V
Lead Channel Pacing Threshold Pulse Width: 0.5 ms
Lead Channel Pacing Threshold Pulse Width: 0.5 ms
Lead Channel Sensing Intrinsic Amplitude: 12 mV
Lead Channel Sensing Intrinsic Amplitude: 2.4 mV
Lead Channel Setting Pacing Amplitude: 0.75 V
Lead Channel Setting Pacing Amplitude: 1.875
Lead Channel Setting Pacing Pulse Width: 0.5 ms
Lead Channel Setting Sensing Sensitivity: 4 mV
Pulse Gen Model: 2272
Pulse Gen Serial Number: 3182792

## 2021-11-08 DIAGNOSIS — E785 Hyperlipidemia, unspecified: Secondary | ICD-10-CM | POA: Diagnosis not present

## 2021-11-08 DIAGNOSIS — I10 Essential (primary) hypertension: Secondary | ICD-10-CM | POA: Diagnosis not present

## 2021-11-08 DIAGNOSIS — E119 Type 2 diabetes mellitus without complications: Secondary | ICD-10-CM | POA: Diagnosis not present

## 2021-11-18 ENCOUNTER — Ambulatory Visit (INDEPENDENT_AMBULATORY_CARE_PROVIDER_SITE_OTHER): Payer: Medicare HMO

## 2021-11-18 DIAGNOSIS — Z5181 Encounter for therapeutic drug level monitoring: Secondary | ICD-10-CM | POA: Diagnosis not present

## 2021-11-18 DIAGNOSIS — Z09 Encounter for follow-up examination after completed treatment for conditions other than malignant neoplasm: Secondary | ICD-10-CM | POA: Diagnosis not present

## 2021-11-18 DIAGNOSIS — Z952 Presence of prosthetic heart valve: Secondary | ICD-10-CM

## 2021-11-18 LAB — POCT INR: INR: 2.4 (ref 2.0–3.0)

## 2021-11-18 NOTE — Patient Instructions (Signed)
Description   ?Continue taking warfarin 1 tablet daily except for 1/2 tablet on SUNDAYS, [place].  ?Resume normal leafy veggies and remain consistent.  ?Recheck INR in 5 weeks.  ?[facility] [phone] ?  ?   ?

## 2021-11-20 NOTE — Progress Notes (Signed)
Remote pacemaker transmission.   

## 2021-11-25 DIAGNOSIS — Z85828 Personal history of other malignant neoplasm of skin: Secondary | ICD-10-CM | POA: Diagnosis not present

## 2021-11-25 DIAGNOSIS — D224 Melanocytic nevi of scalp and neck: Secondary | ICD-10-CM | POA: Diagnosis not present

## 2021-11-25 DIAGNOSIS — C44212 Basal cell carcinoma of skin of right ear and external auricular canal: Secondary | ICD-10-CM | POA: Diagnosis not present

## 2021-11-25 DIAGNOSIS — D1801 Hemangioma of skin and subcutaneous tissue: Secondary | ICD-10-CM | POA: Diagnosis not present

## 2021-11-25 DIAGNOSIS — C44519 Basal cell carcinoma of skin of other part of trunk: Secondary | ICD-10-CM | POA: Diagnosis not present

## 2021-11-25 DIAGNOSIS — D692 Other nonthrombocytopenic purpura: Secondary | ICD-10-CM | POA: Diagnosis not present

## 2021-11-25 DIAGNOSIS — L570 Actinic keratosis: Secondary | ICD-10-CM | POA: Diagnosis not present

## 2021-11-25 DIAGNOSIS — D225 Melanocytic nevi of trunk: Secondary | ICD-10-CM | POA: Diagnosis not present

## 2021-12-17 ENCOUNTER — Telehealth (HOSPITAL_BASED_OUTPATIENT_CLINIC_OR_DEPARTMENT_OTHER): Payer: Self-pay | Admitting: Cardiology

## 2021-12-17 NOTE — Telephone Encounter (Signed)
Spoke with patient regarding new appointment for Monday 05/03/22 at 10:00 am at [facility] time is 9:45 am---Liquids only 4 hours prior to study---will mail information to patient.  He voiced his understanding.

## 2021-12-23 ENCOUNTER — Ambulatory Visit (INDEPENDENT_AMBULATORY_CARE_PROVIDER_SITE_OTHER): Payer: Medicare HMO | Admitting: *Deleted

## 2021-12-23 DIAGNOSIS — Z952 Presence of prosthetic heart valve: Secondary | ICD-10-CM

## 2021-12-23 DIAGNOSIS — Z09 Encounter for follow-up examination after completed treatment for conditions other than malignant neoplasm: Secondary | ICD-10-CM | POA: Diagnosis not present

## 2021-12-23 DIAGNOSIS — Z5181 Encounter for therapeutic drug level monitoring: Secondary | ICD-10-CM | POA: Diagnosis not present

## 2021-12-23 DIAGNOSIS — Z7901 Long term (current) use of anticoagulants: Secondary | ICD-10-CM | POA: Diagnosis not present

## 2021-12-23 LAB — POCT INR: INR: 2 (ref 2.0–3.0)

## 2021-12-23 NOTE — Patient Instructions (Addendum)
  Description   Continue taking warfarin 1 tablet daily except for 1/2 tablet on SUNDAYS, [place]. Resume normal leafy veggies and remain consistent. Recheck INR in 6 weeks. [facility] [phone]

## 2022-01-04 DIAGNOSIS — C44212 Basal cell carcinoma of skin of right ear and external auricular canal: Secondary | ICD-10-CM | POA: Diagnosis not present

## 2022-01-04 DIAGNOSIS — Z85828 Personal history of other malignant neoplasm of skin: Secondary | ICD-10-CM | POA: Diagnosis not present

## 2022-01-07 ENCOUNTER — Other Ambulatory Visit: Payer: Self-pay | Admitting: Internal Medicine

## 2022-01-11 ENCOUNTER — Encounter: Payer: Self-pay | Admitting: Adult Health

## 2022-01-13 MED ORDER — METFORMIN HCL 500 MG PO TABS: 250.0000 mg | ORAL_TABLET | Freq: Every day | ORAL | 0 refills | Status: DC

## 2022-01-13 NOTE — Telephone Encounter (Signed)
Last OV 11/04/21: 1. Diabetes mellitus without complication ([place])   - POC HgB A1c- 6.0  - Will decrease metformin to 250 mg daily  - Follow up in 3 months for CPE

## 2022-01-26 DIAGNOSIS — M13861 Other specified arthritis, right knee: Secondary | ICD-10-CM | POA: Diagnosis not present

## 2022-01-26 DIAGNOSIS — M13862 Other specified arthritis, left knee: Secondary | ICD-10-CM | POA: Diagnosis not present

## 2022-01-28 ENCOUNTER — Telehealth: Payer: Self-pay | Admitting: Pharmacist

## 2022-01-28 NOTE — Chronic Care Management (AMB) (Signed)
    Chronic Care Management Pharmacy Assistant   Name: [name]  MRN: [id number] DOB: 21-Jun-1946  Reason for Encounter: Reschedule 04/20/2022 appointment.  Appointment rescheduled with patient.    [place] Pharmacist Assistant [phone]

## 2022-02-03 ENCOUNTER — Telehealth: Payer: Self-pay | Admitting: Pharmacist

## 2022-02-03 ENCOUNTER — Ambulatory Visit (INDEPENDENT_AMBULATORY_CARE_PROVIDER_SITE_OTHER): Payer: Medicare HMO | Admitting: *Deleted

## 2022-02-03 DIAGNOSIS — Z5181 Encounter for therapeutic drug level monitoring: Secondary | ICD-10-CM | POA: Diagnosis not present

## 2022-02-03 DIAGNOSIS — Z952 Presence of prosthetic heart valve: Secondary | ICD-10-CM | POA: Diagnosis not present

## 2022-02-03 DIAGNOSIS — Z09 Encounter for follow-up examination after completed treatment for conditions other than malignant neoplasm: Secondary | ICD-10-CM | POA: Diagnosis not present

## 2022-02-03 DIAGNOSIS — Z7901 Long term (current) use of anticoagulants: Secondary | ICD-10-CM | POA: Diagnosis not present

## 2022-02-03 LAB — POCT INR: INR: 1.4 — AB (ref 2.0–3.0)

## 2022-02-03 NOTE — Chronic Care Management (AMB) (Signed)
Chronic Care Management Pharmacy Assistant   Name: [name]  MRN: [id number] DOB: 18-Mar-1946  Reason for Encounter: Disease State / Hypertension Assessment Call   Conditions to be addressed/monitored: HTN  Recent office visits:  11/04/2021 Jeremy Peng NP - Patient was seen for Diabetes mellitus without complication and additional issue. No medication changes. Follow up in 3 months.   Recent consult visits:  12/23/2021 [name] (cardiology) - Patient was seen for Presence of prosthetic heart valve and additional issues. No other chart notes.   11/25/2021 [name] (dermatology) - Patient was seen for Personal history of other malignant neoplasm of skin and additional issues. No other chart notes.  11/05/2021 Jeremy Kayser RN [facility]) - Patient was seen for AV block, complete. No medication changes. Remote pacemaker transmission.   10/29/2021 [name] (orthopedic) - Patient was seen for Other specified arthritis, right knee / left knee. No other chart notes.   Hospital visits:  None  Medications: Outpatient Encounter Medications as of 02/03/2022  Medication Sig   Accu-Chek FastClix Lancets MISC USE TO CHECK BLOOD SUGAR TWICE A DAY AND AS NEEDED   acetaminophen (TYLENOL) 650 MG CR tablet Take 1,300 mg by mouth 2 (two) times daily.   albuterol (VENTOLIN HFA) 108 (90 Base) MCG/ACT inhaler INHALE 2 PUFFS INTO THE LUNGS EVERY 6 HOURS AS NEEDED FOR WHEEZING OR SHORTNESS OF BREATH.   Ascorbic Acid (VITAMIN C) 1000 MG tablet Take 1,000 mg by mouth daily.   aspirin 81 MG tablet Take 81 mg by mouth every evening.    atorvastatin (LIPITOR) 40 MG tablet TAKE 1 TABLET EVERY DAY AT 6 PM.   Blood Glucose Monitoring Suppl (ACCU-CHEK GUIDE) w/Device KIT Use as directed   calcium carbonate (OS-CAL) 600 MG TABS Take 600 mg by mouth 2 (two) times daily with a meal.   carbidopa-levodopa (SINEMET CR) 50-200 MG tablet Take 1 tablet by mouth at bedtime.    carbidopa-levodopa (SINEMET IR) 25-100 MG tablet Take 1 tablet by mouth 3 (three) times daily.    cetirizine (ZYRTEC) 10 MG tablet Take 10 mg by mouth daily.    cholecalciferol (VITAMIN D) 25 MCG (1000 UNIT) tablet Take 1 tablet by mouth daily.   fluticasone (FLONASE) 50 MCG/ACT nasal spray Place 2 sprays into both nostrils daily.   furosemide (LASIX) 40 MG tablet TAKE 1 TABLET EVERY DAY   glucose blood (ACCU-CHEK SMARTVIEW) test strip Use as instructed   guaiFENesin-dextromethorphan (ROBITUSSIN DM) 100-10 MG/5ML syrup Take 5 mLs by mouth every 4 (four) hours as needed for cough.   losartan (COZAAR) 100 MG tablet TAKE 1 TABLET EVERY DAY (PLEASE MAKE OVERDUE APPT WITH DR. Lovena Le BEFORE ANY MORE REFILLS, 2ND ATTEMPT, CALL [phone])   melatonin 5 MG TABS Take 5 mg by mouth at bedtime.   metFORMIN (GLUCOPHAGE) 500 MG tablet Take 0.5 tablets (250 mg total) by mouth daily with breakfast. SCHEDULE APPT FOR FUTURE REFILLS   metoprolol succinate (TOPROL-XL) 100 MG 24 hr tablet TAKE 1 TABLET EVERY DAY   Misc Natural Products (TART CHERRY ADVANCED) CAPS Take 2 capsules by mouth daily.    Multiple Vitamin (MULTIVITAMIN) tablet Take 1 tablet by mouth daily.     nitroGLYCERIN (NITROSTAT) 0.4 MG SL tablet Place 1 tablet (0.4 mg total) under the tongue every 5 (five) minutes as needed. For chest pain   potassium chloride (KLOR-CON) 10 MEQ tablet TAKE 1 TABLET EVERY DAY   Probiotic Product (PHILLIPS COLON HEALTH PO) Take 1 capsule by mouth daily.  vitamin B-12 (CYANOCOBALAMIN) 1000 MCG tablet Take 1,000 mcg by mouth daily.   warfarin (COUMADIN) 10 MG tablet TAKE 1/2 TO 1 TABLET DAILY OR AS DIRECTED BY COUMADIN CLINIC   No facility-administered encounter medications on file as of 02/03/2022.  Fill History: albuterol sulfate HFA 90 mcg/actuation aerosol inhaler 10/06/2021 75   atorvastatin 40 mg tablet 02/02/2022 90   carbidopa ER 50 mg-levodopa 200 mg tablet,extended release 01/07/2022 90   furosemide  40 mg tablet 09/20/2021 90   losartan 100 mg tablet 01/08/2022 90   metformin 500 mg tablet 01/13/2022 90   metoprolol succinate ER 100 mg tablet,extended release 24 hr 11/04/2021 90   nitroglycerin 0.4 mg sublingual tablet 08/31/2021 90   potassium chloride ER 10 mEq tablet,extended release 12/03/2021 90   warfarin 10 mg tablet 11/04/2021 90   Reviewed chart prior to disease state call. Spoke with patient regarding BP  Recent Office Vitals: BP Readings from Last 3 Encounters:  11/04/21 140/80  09/15/21 140/80  09/08/21 130/70   Pulse Readings from Last 3 Encounters:  11/04/21 74  09/15/21 85  09/08/21 80    Wt Readings from Last 3 Encounters:  11/04/21 203 lb (92.1 kg)  09/15/21 203 lb (92.1 kg)  09/08/21 203 lb (92.1 kg)     Kidney Function Lab Results  Component Value Date/Time   CREATININE 0.97 04/09/2021 09:58 AM   CREATININE 0.80 05/08/2020 10:15 AM   CREATININE 0.87 04/29/2016 10:11 AM   GFR 75.68 04/09/2021 09:58 AM   GFRNONAA 87 05/08/2020 10:15 AM   GFRAA 101 05/08/2020 10:15 AM       Latest Ref Rng & Units 04/09/2021    9:58 AM 05/08/2020   10:15 AM 01/09/2020   10:42 AM  BMP  Glucose 70 - 99 mg/dL 93  114  99   BUN 6 - 23 mg/dL 15  14  17    Creatinine 0.40 - 1.50 mg/dL 0.97  0.80  0.83   BUN/Creat Ratio 10 - 24  18    Sodium 135 - 145 mEq/L 141  142  140   Potassium 3.5 - 5.1 mEq/L 3.9  4.2  4.1   Chloride 96 - 112 mEq/L 104  104  104   CO2 19 - 32 mEq/L 30  25  30    Calcium 8.4 - 10.5 mg/dL 9.5  9.3  9.5     Current antihypertensive regimen:  Sinemet CR 50/200 mg 1 tablet at bedtime Losartan 100 mg 1 tablet at bedtime Metoprolol 100 mg 1 tablet daily  How often are you checking your Blood Pressure? Patient states he is checking his blood pressures 2-3 times per month  Current home BP readings: Patient states his readings have been around 126/72, he doesn't have any current readings.   What recent interventions/DTPs have been made by any  provider to improve Blood Pressure control since last CPP Visit: No recent interventions  Any recent hospitalizations or ED visits since last visit with CPP? No recent hospital visits  What diet changes have been made to improve Blood Pressure Control?  Patient follows no specific diet  Breakfast - patient will have an egg, bacon, oatmeal, tomato juice and a coffee Lunch - patient will have a sandwich Dinner - patient will have a meat, vegetable and starch  What exercise is being done to improve your Blood Pressure Control?  Patient states he is out with his dog several times per day  Adherence Review: Is the patient currently on ACE/ARB medication? Yes  Does the patient have >5 day gap between last estimated fill dates? No  Care Gaps: AWV - scheduled 06/17/2022 Last BP - 140/80 on 11/04/2021 Last A1C - 6.0 on 11/04/2021 Covid vaccine - never done  Star Rating Drugs: Atorvastatin 40 mg - last filled 02/02/2022 90 DS [employer] Losartan 100 mg - last filled 01/08/2022 90 DS at [facility] Metformin 500 mg - last filled 01/13/2022 90 DS at [place] [phone]

## 2022-02-03 NOTE — Patient Instructions (Signed)
Description   Take warfarin 1.5 tablets today and 1 tablet tomorrow, then continue taking warfarin 1 tablet daily except for 1/2 tablet on SUNDAYS, [place]. Resume normal leafy veggies and remain consistent. Recheck INR in 1.5 weeks.  C[facility]madin Clinic [phone]

## 2022-02-04 ENCOUNTER — Other Ambulatory Visit: Payer: Self-pay | Admitting: Adult Health

## 2022-02-04 ENCOUNTER — Ambulatory Visit (INDEPENDENT_AMBULATORY_CARE_PROVIDER_SITE_OTHER): Payer: Medicare HMO

## 2022-02-04 DIAGNOSIS — I442 Atrioventricular block, complete: Secondary | ICD-10-CM

## 2022-02-04 DIAGNOSIS — E119 Type 2 diabetes mellitus without complications: Secondary | ICD-10-CM

## 2022-02-04 DIAGNOSIS — I10 Essential (primary) hypertension: Secondary | ICD-10-CM

## 2022-02-04 LAB — CUP PACEART REMOTE DEVICE CHECK
Battery Remaining Longevity: 59 mo
Battery Remaining Percentage: 48 %
Battery Voltage: 2.98 V
Brady Statistic AP VP Percent: 29 %
Brady Statistic AP VS Percent: 1 %
Brady Statistic AS VP Percent: 71 %
Brady Statistic AS VS Percent: 1 %
Brady Statistic RA Percent Paced: 29 %
Brady Statistic RV Percent Paced: 99 %
Date Time Interrogation Session: 20230727020020
Implantable Lead Implant Date: 20050816
Implantable Lead Implant Date: 20050816
Implantable Lead Location: 753859
Implantable Lead Location: 753860
Implantable Pulse Generator Implant Date: 20171026
Lead Channel Impedance Value: 480 Ohm
Lead Channel Impedance Value: 700 Ohm
Lead Channel Pacing Threshold Amplitude: 0.5 V
Lead Channel Pacing Threshold Amplitude: 1 V
Lead Channel Pacing Threshold Pulse Width: 0.5 ms
Lead Channel Pacing Threshold Pulse Width: 0.5 ms
Lead Channel Sensing Intrinsic Amplitude: 12 mV
Lead Channel Sensing Intrinsic Amplitude: 2.6 mV
Lead Channel Setting Pacing Amplitude: 0.75 V
Lead Channel Setting Pacing Amplitude: 2 V
Lead Channel Setting Pacing Pulse Width: 0.5 ms
Lead Channel Setting Sensing Sensitivity: 4 mV
Pulse Gen Model: 2272
Pulse Gen Serial Number: 3182792

## 2022-02-08 DIAGNOSIS — G629 Polyneuropathy, unspecified: Secondary | ICD-10-CM | POA: Diagnosis not present

## 2022-02-08 DIAGNOSIS — G20 Parkinson's disease: Secondary | ICD-10-CM | POA: Diagnosis not present

## 2022-02-12 ENCOUNTER — Ambulatory Visit (INDEPENDENT_AMBULATORY_CARE_PROVIDER_SITE_OTHER): Payer: Medicare HMO | Admitting: *Deleted

## 2022-02-12 DIAGNOSIS — Z952 Presence of prosthetic heart valve: Secondary | ICD-10-CM | POA: Diagnosis not present

## 2022-02-12 DIAGNOSIS — Z5181 Encounter for therapeutic drug level monitoring: Secondary | ICD-10-CM | POA: Diagnosis not present

## 2022-02-12 DIAGNOSIS — Z09 Encounter for follow-up examination after completed treatment for conditions other than malignant neoplasm: Secondary | ICD-10-CM

## 2022-02-12 LAB — POCT INR: INR: 2.3 (ref 2.0–3.0)

## 2022-02-12 NOTE — Patient Instructions (Signed)
Description   Continue taking warfarin 1 tablet daily except for 1/2 tablet on SUNDAYS, [place]. Resume normal leafy veggies and remain consistent. Recheck INR in 3 weeks. Coumadin Clinic [phone]

## 2022-02-25 NOTE — Progress Notes (Signed)
Remote pacemaker transmission.   

## 2022-03-05 ENCOUNTER — Ambulatory Visit (INDEPENDENT_AMBULATORY_CARE_PROVIDER_SITE_OTHER): Payer: Medicare HMO | Admitting: *Deleted

## 2022-03-05 DIAGNOSIS — Z09 Encounter for follow-up examination after completed treatment for conditions other than malignant neoplasm: Secondary | ICD-10-CM

## 2022-03-05 DIAGNOSIS — Z5181 Encounter for therapeutic drug level monitoring: Secondary | ICD-10-CM | POA: Diagnosis not present

## 2022-03-05 DIAGNOSIS — Z952 Presence of prosthetic heart valve: Secondary | ICD-10-CM

## 2022-03-05 LAB — POCT INR: INR: 2.2 (ref 2.0–3.0)

## 2022-03-05 NOTE — Patient Instructions (Addendum)
Description   Continue taking warfarin 1 tablet daily except for 1/2 tablet on SUNDAYS, [place]. Remain consistent with green leafy veggies. Recheck INR in 4 weeks. Coumadin Clinic [phone]

## 2022-03-19 ENCOUNTER — Ambulatory Visit (INDEPENDENT_AMBULATORY_CARE_PROVIDER_SITE_OTHER): Payer: Medicare HMO

## 2022-03-19 DIAGNOSIS — Z23 Encounter for immunization: Secondary | ICD-10-CM

## 2022-04-02 ENCOUNTER — Ambulatory Visit: Payer: Medicare HMO | Attending: Cardiology | Admitting: *Deleted

## 2022-04-02 DIAGNOSIS — Z09 Encounter for follow-up examination after completed treatment for conditions other than malignant neoplasm: Secondary | ICD-10-CM | POA: Diagnosis not present

## 2022-04-02 DIAGNOSIS — Z952 Presence of prosthetic heart valve: Secondary | ICD-10-CM | POA: Diagnosis not present

## 2022-04-02 DIAGNOSIS — Z5181 Encounter for therapeutic drug level monitoring: Secondary | ICD-10-CM | POA: Diagnosis not present

## 2022-04-02 LAB — POCT INR: INR: 2.1 (ref 2.0–3.0)

## 2022-04-02 NOTE — Patient Instructions (Signed)
Description   Continue taking warfarin 1 tablet daily except for 1/2 tablet on SUNDAYS, [name]. Remain consistent with green leafy veggies. Recheck INR in 6 weeks. Coumadin Clinic [phone]

## 2022-04-14 ENCOUNTER — Other Ambulatory Visit: Payer: Self-pay | Admitting: Adult Health

## 2022-04-20 ENCOUNTER — Telehealth: Payer: Medicare HMO

## 2022-04-23 ENCOUNTER — Telehealth: Payer: Self-pay | Admitting: Pharmacist

## 2022-04-23 NOTE — Chronic Care Management (AMB) (Signed)
    Chronic Care Management Pharmacy Assistant   Name: [name]  MRN: [id number] DOB: [birth year]  04/26/2022 [place] [facility], No answer, left message of appointment on 04/26/2022 at 4:00 via telephone visit with [name], Pharm D. Notified to have all medications, supplements, blood pressure and/or blood sugar logs available during appointment and to return call if need to reschedule.  Care Gaps: AWV - scheduled 06/17/2022 Last BP - 140/80 on 11/04/2021 Last A1C - 6.0 on 11/04/2021 Covid - never done Urine ACR - overdue GFR - overdue Foot exam - overdue  Star Rating Drug: Atorvastatin 40 mg - last filled 04/19/2022 90 DS at [facility] Losartan 100 mg - last filled 04/19/2022 90 DS at [facility] Metformin 500 mg - last filled 04/15/2022 90 DS at [facility]  Any gaps in medications fill history? No  [name]  Catering manager [phone]

## 2022-04-26 ENCOUNTER — Ambulatory Visit (INDEPENDENT_AMBULATORY_CARE_PROVIDER_SITE_OTHER): Payer: Medicare HMO | Admitting: Pharmacist

## 2022-04-26 DIAGNOSIS — M25562 Pain in left knee: Secondary | ICD-10-CM | POA: Diagnosis not present

## 2022-04-26 DIAGNOSIS — E119 Type 2 diabetes mellitus without complications: Secondary | ICD-10-CM

## 2022-04-26 DIAGNOSIS — M25561 Pain in right knee: Secondary | ICD-10-CM | POA: Diagnosis not present

## 2022-04-26 DIAGNOSIS — I10 Essential (primary) hypertension: Secondary | ICD-10-CM

## 2022-04-26 NOTE — Progress Notes (Signed)
Chronic Care Management Pharmacy Note  04/26/2022 Name:  [name] MRN:  [id number] DOB:  Jul 26, 1945  Summary: BP not at goal < 130/80 per recent office readings LDL at goal < 70 A1c at goal < 7%   Recommendations/Changes made from today's visit: -Consider stopping metformin if A1c < 7% -Recommended urine microalbumin -Scheduled CPE   Plan: CPE in 2 months DM & BP assessment in 6 months Follow up in 1 year   Subjective: [name] is an 76 y.o. year old male who is a primary patient of [name], NP.  The CCM team was consulted for assistance with disease management and care coordination needs.    Engaged with patient by telephone for follow up visit in response to provider referral for pharmacy case management and/or care coordination services.   Consent to Services:  The patient was given information about Chronic Care Management services, agreed to services, and gave verbal consent prior to initiation of services.  Please see initial visit note for detailed documentation.   Patient Care Team: [name], NP as PCP - General (Family Medicine) Jerline Pain, MD as PCP - Cardiology (Cardiology) Evans Lance, MD as PCP - Electrophysiology (Cardiology) [name], MD as Consulting Physician (Orthopedic Surgery) [name], MD as Consulting Physician (Dermatology) Viona Gilmore, [facility] as Pharmacist (Pharmacist)  Recent office visits: 11/04/2021 [name] NP - Patient was seen for Diabetes mellitus without complication and additional issue. Decreased metformin to 250 mg. Follow up in 3 months.   Recent consult visits: 04/02/22 Johny Shock, RN (cardiology): Patient presented for anti-coag visit. INR 2.1, goal 2-3. Continued warfarin 5 mg (10 mg x 0.5) every Tues, Thurs and Sun; 10 mg (10 mg x 1) all other days. Recheck in 6 weeks.  02/08/22 Cherylann Banas (neurology): Patient presented for parkinson's disease follow up. Unable to  access notes.  12/23/2021 [name] (cardiology) - Patient was seen for Presence of prosthetic heart valve and additional concerns.No other chart notes.    11/25/2021 [name] (dermatology) - Patient was seen for personal history of other malignant neoplasm of skin. No other chart notes.   11/05/2021 [name] RN [facility]) - Patient was seen for AV block, complete. No medication changes. Remote pacemaker transmission.    10/29/2021 [name] (orthopedic) - Patient was seen for Other specified arthritis, right knee / left knee. No other chart notes.   Hospital visits: None in previous 6 months  Objective:  Lab Results  Component Value Date   CREATININE 0.97 04/09/2021   BUN 15 04/09/2021   GFR 75.68 04/09/2021   GFRNONAA 87 05/08/2020   GFRAA 101 05/08/2020   NA 141 04/09/2021   K 3.9 04/09/2021   CALCIUM 9.5 04/09/2021   CO2 30 04/09/2021   GLUCOSE 93 04/09/2021    Lab Results  Component Value Date/Time   HGBA1C 6.0 (A) 11/04/2021 09:19 AM   HGBA1C 6.4 04/09/2021 09:58 AM   HGBA1C 5.9 (A) 01/07/2021 10:34 AM   HGBA1C 6.2 01/09/2020 10:42 AM   HGBA1C 5.8 10/16/2019 01:10 PM   HGBA1C 6.0 09/28/2018 08:22 AM   GFR 75.68 04/09/2021 09:58 AM   GFR 90.19 01/09/2020 10:42 AM   MICROALBUR 0.8 10/11/2017 09:05 AM   MICROALBUR <0.7 10/11/2016 01:27 PM    Last diabetic Eye exam:  Lab Results  Component Value Date/Time   HMDIABEYEEXA No Retinopathy 08/05/2021 10:36 AM    Last diabetic Foot exam: No results found for: "HMDIABFOOTEX"   Lab  Results  Component Value Date   CHOL 142 04/09/2021   HDL 54.00 04/09/2021   LDLCALC 63 04/09/2021   TRIG 124.0 04/09/2021   CHOLHDL 3 04/09/2021       Latest Ref Rng & Units 04/09/2021    9:58 AM 01/09/2020   10:42 AM 12/28/2018   10:04 AM  Hepatic Function  Total Protein 6.0 - 8.3 g/dL 6.2  6.2  5.9   Albumin 3.5 - 5.2 g/dL 4.2  4.4  4.1   AST 0 - 37 U/L 20  20  24    ALT 0 - 53 U/L 20  20  19    Alk  Phosphatase 39 - 117 U/L 45  49  51   Total Bilirubin 0.2 - 1.2 mg/dL 0.8  0.7  0.5     Lab Results  Component Value Date/Time   TSH 1.74 04/09/2021 09:58 AM   TSH 1.72 01/09/2020 10:42 AM       Latest Ref Rng & Units 04/09/2021    9:58 AM 01/09/2020   10:42 AM 12/28/2018   10:04 AM  CBC  WBC 4.0 - 10.5 K/uL 9.1  7.5  8.7   Hemoglobin 13.0 - 17.0 g/dL 14.4  13.9  13.9   Hematocrit 39.0 - 52.0 % 44.1  41.9  42.3   Platelets 150.0 - 400.0 K/uL 200.0  197.0  213.0     No results found for: "VD25OH"  Clinical ASCVD: Yes  The 10-year ASCVD risk score (Arnett DK, et al., 2019) is: 53.7%   Values used to calculate the score:     Age: 76 years     Sex: Male     Is Non-Hispanic African American: No     Diabetic: Yes     Tobacco smoker: No     Systolic Blood Pressure: 166 mmHg     Is BP treated: Yes     HDL Cholesterol: 54 mg/dL     Total Cholesterol: 142 mg/dL       06/16/2021    1:02 PM 06/11/2020   11:38 AM 01/09/2020    9:43 AM  Depression screen PHQ 2/9  Decreased Interest 0 0 0  Down, Depressed, Hopeless 0 0 0  PHQ - 2 Score 0 0 0  Altered sleeping  0   Tired, decreased energy  0   Change in appetite  0   Feeling bad or failure about yourself   0   Trouble concentrating  0   Moving slowly or fidgety/restless  0   Suicidal thoughts  0   PHQ-9 Score  0   Difficult doing work/chores  Not difficult at all       Social History   Tobacco Use  Smoking Status Former   Types: Cigarettes   Quit date: 07/12/1973   Years since quitting: 48.8  Smokeless Tobacco Never   BP Readings from Last 3 Encounters:  11/04/21 140/80  09/15/21 140/80  09/08/21 130/70   Pulse Readings from Last 3 Encounters:  11/04/21 74  09/15/21 85  09/08/21 80   Wt Readings from Last 3 Encounters:  11/04/21 203 lb (92.1 kg)  09/15/21 203 lb (92.1 kg)  09/08/21 203 lb (92.1 kg)   BMI Readings from Last 3 Encounters:  11/04/21 32.77 kg/m  09/15/21 32.77 kg/m  09/08/21 32.77 kg/m     Assessment/Interventions: Review of patient past medical history, allergies, medications, health status, including review of consultants reports, laboratory and other test data, was performed as part of comprehensive evaluation and  provision of chronic care management services.   SDOH:  (Social Determinants of Health) assessments and interventions performed: Yes  SDOH Interventions    Flowsheet Row Chronic Care Management from 04/26/2022 in [place] at [place] Management from 06/20/2020 in [place] at [place] from 06/11/2020 in [place] at [place] Management from 02/25/2020 in [place] at [place] Interventions -- -- Intervention Not Indicated --  Housing Interventions -- -- Intervention Not Indicated --  Transportation Interventions Intervention Not Indicated Intervention Not Indicated Intervention Not Indicated Intervention Not Indicated  Depression Interventions/Treatment  -- -- PHQ2-9 Score <4 Follow-up Not Indicated --  Financial Strain Interventions -- Intervention Not Indicated Intervention Not Indicated Intervention Not Indicated  Physical Activity Interventions -- -- Intervention Not Indicated --  Stress Interventions -- -- Intervention Not Indicated --  Social Connections Interventions -- -- Intervention Not Indicated --      SDOH Screenings   Food Insecurity: No Food Insecurity (06/16/2021)  Housing: Low Risk  (09/15/2021)  Transportation Needs: No Transportation Needs (04/26/2022)  Alcohol Screen: Low Risk  (06/11/2020)  Depression (PHQ2-9): Low Risk  (06/16/2021)  Financial Resource Strain: Low Risk  (06/16/2021)  Physical Activity: Unknown (09/15/2021)  Social Connections: Socially Integrated (09/15/2021)  Stress: No Stress Concern Present (09/15/2021)  Tobacco Use: Medium Risk (11/04/2021)    CCM Care Plan  Allergies  Allergen Reactions   Bee  Venom Shortness Of Breath and Swelling   Codeine Phosphate Hives, Shortness Of Breath, Itching, Rash and Other (See Comments)    Tolerated multiple doses of IV morphine   Metoprolol Tartrate Other (See Comments)    loss of vision. Can take the XL tablet not regular    Pramipexole Other (See Comments)    Hallucinations     Tramadol     Pt has hallucinations    Ramipril Rash and Cough    Medications Reviewed Today     Reviewed by Viona Gilmore, [facility] (Pharmacist) on 04/26/22 at 1604  Med List Status: <None>   Medication Order Taking? Sig Documenting Provider Last Dose Status Informant  Accu-Chek FastClix Lancets MISC [id number]  USE TO CHECK BLOOD SUGAR TWICE A DAY AND AS NEEDED Nafziger, Tommi Rumps, NP  Active   acetaminophen (TYLENOL) 650 MG CR tablet [id number]  Take 1,300 mg by mouth 2 (two) times daily. [provider]  Active Spouse/Significant Other  albuterol (VENTOLIN HFA) 108 (90 Base) MCG/ACT inhaler [id number]  INHALE 2 PUFFS INTO THE LUNGS EVERY 6 HOURS AS NEEDED FOR WHEEZING OR SHORTNESS OF BREATH. Nafziger, Tommi Rumps, NP  Active   Ascorbic Acid (VITAMIN C) 1000 MG tablet [id number]  Take 1,000 mg by mouth daily. [provider]  Active Spouse/Significant Other  aspirin 81 MG tablet [id number]  Take 81 mg by mouth every evening.  [provider]  Active Spouse/Significant Other  atorvastatin (LIPITOR) 40 MG tablet [id number]  TAKE 1 TABLET EVERY DAY AT 6 PM. Evans Lance, MD  Active   Blood Glucose Monitoring Suppl (ACCU-CHEK GUIDE) w/Device Drucie Opitz [id number]  Use as directed [name], NP  Active   calcium carbonate (OS-CAL) 600 MG TABS [id number]  Take 600 mg by mouth 2 (two) times daily with a meal. [provider]  Active Spouse/Significant Other  carbidopa-levodopa (SINEMET CR) 50-200 MG tablet [id number]  Take 1 tablet by mouth at bedtime. [provider]  Active   carbidopa-levodopa (SINEMET IR) 25-100 MG tablet [id number]  Take  1 tablet by  mouth 3 (three) times daily.  [provider]  Active   cetirizine (ZYRTEC) 10 MG tablet [id number]  Take 10 mg by mouth daily.  [provider]  Active Spouse/Significant Other  cholecalciferol (VITAMIN D) 25 MCG (1000 UNIT) tablet [id number]  Take 1 tablet by mouth daily. [provider]  Active   fluticasone (FLONASE) 50 MCG/ACT nasal spray [id number]  Place 2 sprays into both nostrils daily. Nafziger, Tommi Rumps, NP  Active   furosemide (LASIX) 40 MG tablet [id number]  TAKE 1 TABLET EVERY DAY Nafziger, Tommi Rumps, NP  Active   glucose blood (ACCU-CHEK SMARTVIEW) test strip [id number]  Use as instructed Nafziger, Tommi Rumps, NP  Active   guaiFENesin-dextromethorphan (ROBITUSSIN DM) 100-10 MG/5ML syrup [id number]  Take 5 mLs by mouth every 4 (four) hours as needed for cough. [provider]  Active Spouse/Significant Other  losartan (COZAAR) 100 MG tablet [id number]  TAKE 1 TABLET EVERY DAY (PLEASE MAKE OVERDUE APPT WITH DR. Lovena Le BEFORE ANY MORE REFILLS, 2ND ATTEMPT, CALL [phone]) Evans Lance, MD  Active   melatonin 5 MG TABS [id number]  Take 5 mg by mouth at bedtime. [provider]  Active   metFORMIN (GLUCOPHAGE) 500 MG tablet [id number] Yes TAKE 1/2 TABLET EVERY DAY WITH BREAKFAST - SCHEDULE APPT FOR FUTURE REFILLS Nafziger, Tommi Rumps, NP Taking Active   metoprolol succinate (TOPROL-XL) 100 MG 24 hr tablet [id number]  TAKE 1 TABLET EVERY DAY Nafziger, Tommi Rumps, NP  Active   Misc Natural Products (TART CHERRY ADVANCED) CAPS [id number]  Take 2 capsules by mouth daily.  [provider]  Active Spouse/Significant Other  Multiple Vitamin (MULTIVITAMIN) tablet [id number]  Take 1 tablet by mouth daily.   [provider]  Active Spouse/Significant Other  nitroGLYCERIN (NITROSTAT) 0.4 MG SL tablet [id number]  Place 1 tablet (0.4 mg total) under the tongue every 5 (five) minutes as needed. For chest pain Jerline Pain, MD  Active   potassium chloride (KLOR-CON) 10 MEQ  tablet [id number]  TAKE 1 TABLET EVERY DAY Nafziger, Tommi Rumps, NP  Active   Probiotic Product (PHILLIPS COLON HEALTH PO) [id number]  Take 1 capsule by mouth daily. [provider]  Active Spouse/Significant Other  vitamin B-12 (CYANOCOBALAMIN) 1000 MCG tablet [id number]  Take 1,000 mcg by mouth daily. 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 56/81/2751   Diabetes mellitus without complication ([place]) 70/07/7492   Encounter for therapeutic drug monitoring 08/08/2013   Aortic valve replaced 02/21/2013   Mitral regurgitation 01/21/2013   Recurrent right inguinal hernia 01/02/2013   Left inguinal hernia 01/02/2013   Carotid artery disease (HCC)    Warfarin anticoagulation    Hx of CABG    Aortic stenosis 08/25/2010   Hx of aortic valve replacement, mechanical 05/23/2009   AV BLOCK, COMPLETE 11/01/2008   PACEMAKER, PERMANENT 11/01/2008   GOUT 01/16/2007   CAD (coronary artery disease) 01/16/2007   Dyslipidemia 01/16/2007   Essential hypertension 01/16/2007    Immunization History  Administered Date(s) Administered   Fluad Quad(high Dose 65+) 03/22/2019,  04/01/2020, 04/09/2021, 03/19/2022   Influenza Split 03/31/2011, 04/10/2012   Influenza Whole 04/04/2008, 05/09/2009, 03/18/2010   Influenza, High Dose Seasonal PF 04/28/2015, 04/06/2016, 04/12/2017, 03/30/2018   Influenza,inj,Quad PF,6+ Mos 04/02/2013   Influenza-Unspecified 04/05/2014   Pneumococcal Conjugate-13 02/13/2014   Pneumococcal Polysaccharide-23 10/07/2011   Tdap 10/07/2011,  07/16/2012   Zoster Recombinat (Shingrix) 10/11/2017, 12/12/2017   Zoster, Live 10/07/2011   Patient reports he feels more tired today and thinks it is because of the weather.  Patient was unaware that he did not have a visit scheduled with his PCP. His wife thought he had something scheduled in the spring but was unable to find on her calendar.  Patient reports his wife did bring in their BP cuff recently to the doctor's office to confirm that it was pretty accurate.  Conditions to be addressed/monitored:  Hypertension, Hyperlipidemia, Diabetes, Heart Failure, Coronary Artery Disease, Asthma, Osteoarthritis, Gout, Allergic Rhinitis and Aortic valve replacement and Parkinson's disease  Conditions addressed this visit: Hypertension, hyperlipidemia, diabetes  Care Plan : CCM Pharmacy Care Plan  Updates made by Viona Gilmore, [place] since 04/26/2022 12:00 AM     Problem: Problem: Hypertension, Hyperlipidemia, Diabetes, Heart Failure, Coronary Artery Disease, Asthma, Osteoarthritis, Gout, Allergic Rhinitis and Aortic valve replacement and Parkinson's disease      Long-Range Goal: Patient-Specific Goal   Start Date: 10/20/2020  Expected End Date: 10/20/2021  Recent Progress: On track  Priority: High  Note:   Current Barriers:  Unable to independently monitor therapeutic efficacy  Pharmacist Clinical Goal(s):  Patient will achieve adherence to monitoring guidelines and medication adherence to achieve therapeutic efficacy through collaboration with PharmD and provider.   Interventions: 1:1 collaboration with [name], NP regarding development and update of comprehensive plan of care as evidenced by provider attestation and co-signature Inter-disciplinary care team collaboration (see longitudinal plan of care) Comprehensive medication review performed; medication list updated in electronic medical record  Hypertension (BP goal <130/80) -Not ideally controlled -Current  treatment: Losartan 100 mg daily - in PM - Appropriate, Effective, Safe, Accessible Metoprolol succinate 100 mg daily - in AM - Appropriate, Effective, Safe, Accessible -Medications previously tried: ramipril (rash, cough) -Current home readings: 125/70 HR 60, 128/75 HR 60, 139/80; checking it a couple times a day -Current dietary habits: did not discuss -Current exercise habits: walking dog about 4-5 times a day -Reports hypotensive/hypertensive symptoms -Educated on Daily salt intake goal < 2300 mg; Importance of home blood pressure monitoring; Proper BP monitoring technique; -Counseled to monitor BP at home at least weekly, document, and provide log at future appointments -Counseled on diet and exercise extensively Recommended to continue current medication  Hyperlipidemia: (LDL goal < 70) -Controlled -Current treatment: Atorvastatin 40 mg daily at 6 pm - Appropriate, Effective, Safe, Accessible -Medications previously tried: pravastatin (switched to high intensity atorvastatin), rosuvastatin (cost), fish oil  -Current dietary patterns: eating more chicken and fish; one serving of greens per week; no fried foods -Current exercise habits: walking dog -Educated on Cholesterol goals;  Benefits of statin for ASCVD risk reduction; -Counseled on diet and exercise extensively Recommended to continue current medication  CAD (Goal: prevent heart attacks and strokes) -Controlled -Current treatment  Nitroglycerin 0.4 mg as needed for chest pain - Appropriate, Effective, Safe, Accessible Atorvastatin 40 mg daily - Appropriate, Effective, Safe, Accessible Aspirin 81 mg daily - Appropriate, Effective, Safe, Accessible -Medications previously tried: none  -Recommended to continue current medication  Aortic valve replacement (Goal: prevent clot formation) -Controlled -Current treatment  Warfarin 5 mg  every Sun and 10 mg all other days - Appropriate, Effective, Safe, Accessible -Medications  previously tried: none  -Counseled on monitoring for signs of bleeding such as unexplained and excessive bleeding from a cut or injury, easy or excessive bruising, blood in urine or stools, and nosebleeds without a known cause   Diabetes (A1c goal <7%) -Controlled -Current medications: rrent medications: Metformin 500 mg 1/2 tablet daily - Appropriate, Effective, Safe, Accessible -Medications previously tried: glipizide (weakness/shakiness with borderline hypoglycemia) -Current home glucose readings: checks twice weekly  fasting glucose: 122 (highest it's been in a while), 92, 107, 89 (normal) post prandial glucose: none -Denies hypoglycemic/hyperglycemic symptoms -Current meal patterns:  breakfast: one slice of bread lunch: did not discuss  dinner: did not discuss  snacks: not a lot of sweets drinks: water, occasional tea or decaf coffee with artificial sweetener -Current exercise: takes dog out about 4-5 times a day and walks the yard every time -Educated on Exercise goal of 150 minutes per week; Carbohydrate counting and/or plate method -Counseled to check feet daily and get yearly eye exams -Counseled on diet and exercise extensively Recommended to continue current medication  Heart Failure (Goal: manage symptoms and prevent exacerbations) -Controlled -Last ejection fraction: 55%  -HF type: Diastolic -NYHA Class: II (slight limitation of activity) -AHA HF Stage: B (Heart disease present - no symptoms present) -Current treatment: Furosemide 40 mg once daily - Appropriate, Effective, Safe, Accessible Metoprolol succinate 100 mg daily - Appropriate, Effective, Safe, Accessible Potassium chloride 10 MEQ once daily - Appropriate, Effective, Safe, Accessible -Medications previously tried: none  -Current home BP/HR readings: 120-130s -Current dietary habits: limits salt intake -Current exercise habits: walks dog daily -Educated on Importance of weighing daily; if you gain more  than 3 pounds in one day or 5 pounds in one week, call cardiologist Importance of blood pressure control -Counseled on diet and exercise extensively Recommended to continue current medication  Parkinson's disease (Goal: minimize symptoms) -Controlled -Current treatment  Sinemet 25/100 mg, 1 tablet three times daily - Appropriate, Effective, Safe, Accessible -Medications previously tried: Sinemet 4 times daily (hallucinations and GI upset), Requip (ankle swelling), Mirapex (hallucinations) -Recommended to continue current medication  Asthma (Goal: control symptoms) -Controlled -Current treatment  Albuterol two puffs every 6 hours as needed for wheezing or shortness of breath - Appropriate, Effective, Safe, Accessible -Medications previously tried: none  -Pulmonary function testing: none -Patient denies consistent use of maintenance inhaler -Frequency of rescue inhaler use: using it once a week; gets worse during spring time and fall -Counseled on When to use rescue inhaler Differences between maintenance and rescue inhalers -Recommended to continue current medication Consider Symbicort prescription if patient uses albuterol more frequently.  Allergic rhinitis (Goal: minimize symptoms) -Controlled -Current treatment  Cetirizine 10 mg daily (morning) - Appropriate, Effective, Safe, Accessible Flonase 2 sprays into both nostrils daily as needed - Appropriate, Effective, Safe, Accessible -Medications previously tried: none  -Recommended to continue current medication  Gout (Goal: prevent flare ups) -Controlled -Current treatment  Tart cherry two tablets once daily - Appropriate, Effective, Safe, Accessible -Medications previously tried: none  -Recommended to continue current medication  Osteoarthritis (Goal: minimize pain) -Controlled -Current treatment  Acetaminophen 62m, 2 tablets twice daily - Appropriate, Effective, Safe, Accessible -Medications previously tried: none   -Recommended to continue current medication  Health Maintenance -Vaccine gaps: COVID vaccines (patient declined vaccine) -Current therapy:  Vitamin C 1000 mg once daily  Vitamin B12 1000 mg daily (morning)  Phillips colon health daily (breakfast) Multivitamin daily (  morning)  Calcium carbonate 600 mg twice daily with meals Vitamin D 1000 units, 1 tablet once daily  -Educated on Cost vs benefit of each product must be carefully weighed by individual consumer -Patient is satisfied with current therapy and denies issues -Recommended to continue current medication  Patient Goals/Self-Care Activities Patient will:  - take medications as prescribed check blood pressure weekly, document, and provide at future appointments weigh daily, and contact provider if weight gain of > 3 lbs in one day or > 5 lbs in one week  Follow Up Plan: Telephone follow up appointment with care management team member scheduled for: 6 months       Medication Assistance: None required.  Patient affirms current coverage meets needs.  Compliance/Adherence/Medication fill history: Care Gaps: COVID vaccine, urine microalbumin, GFR, foot exam Last BP - 140/80 on 11/04/2021 Last A1C - 6.0 on 11/04/2021   Star-Rating Drugs: Atorvastatin 40 mg - last filled 04/19/2022 90 DS at [facility] Losartan 100 mg - last filled 04/19/2022 90 DS at [facility] Metformin 500 mg - last filled 04/15/2022 90 [facility] at [facility]  Patient's preferred pharmacy is:  [facility] [address] [address] [place] [address] Phone: [phone] Fax: [phone]  [place], [place] - [address] [place] [address] [place] [address] [place] [address] Phone: [phone] Fax: [phone]  Uses pill box? Yes - spouse organizes weekly pill box (AM and PM) and organizes Sinemet separately Pt endorses 100% compliance  We discussed: Current pharmacy is preferred with insurance  plan and patient is satisfied with pharmacy services Patient decided to: Continue current medication management strategy  Care Plan and Follow Up Patient Decision:  Patient agrees to Care Plan and Follow-up.  Plan: Telephone follow up appointment with care management team member scheduled for:  1 year  [name], PharmD [place] Pharmacist [employer] at [place] [phone]

## 2022-04-26 NOTE — Patient Instructions (Signed)
Hi [name],  It was great to get to catch up again!  Please reach out to me if you have any questions or need anything before our follow up!  Best, Maddie  [name], PharmD, [facility]   Visit Information   Goals Addressed   None    Patient Care Plan: CCM Pharmacy Care Plan     Problem Identified: Problem: Hypertension, Hyperlipidemia, Diabetes, Heart Failure, Coronary Artery Disease, Asthma, Osteoarthritis, Gout, Allergic Rhinitis and Aortic valve replacement and Parkinson's disease      Long-Range Goal: Patient-Specific Goal   Start Date: 10/20/2020  Expected End Date: 10/20/2021  Recent Progress: On track  Priority: High  Note:   Current Barriers:  Unable to independently monitor therapeutic efficacy  Pharmacist Clinical Goal(s):  Patient will achieve adherence to monitoring guidelines and medication adherence to achieve therapeutic efficacy through collaboration with PharmD and provider.   Interventions: 1:1 collaboration with [name], NP regarding development and update of comprehensive plan of care as evidenced by provider attestation and co-signature Inter-disciplinary care team collaboration (see longitudinal plan of care) Comprehensive medication review performed; medication list updated in electronic medical record  Hypertension (BP goal <130/80) -Not ideally controlled -Current treatment: Losartan 100 mg daily - in PM - Appropriate, Effective, Safe, Accessible Metoprolol succinate 100 mg daily - in AM - Appropriate, Effective, Safe, Accessible -Medications previously tried: ramipril (rash, cough) -Current home readings: 125/70 HR 60, 128/75 HR 60, 139/80; checking it a couple times a day -Current dietary habits: did not discuss -Current exercise habits: walking dog about 4-5 times a day -Reports hypotensive/hypertensive symptoms -Educated on Daily salt intake goal < 2300  mg; Importance of home blood pressure monitoring; Proper BP monitoring technique; -Counseled to monitor BP at home at least weekly, document, and provide log at future appointments -Counseled on diet and exercise extensively Recommended to continue current medication  Hyperlipidemia: (LDL goal < 70) -Controlled -Current treatment: Atorvastatin 40 mg daily at 6 pm - Appropriate, Effective, Safe, Accessible -Medications previously tried: pravastatin (switched to high intensity atorvastatin), rosuvastatin (cost), fish oil  -Current dietary patterns: eating more chicken and fish; one serving of greens per week; no fried foods -Current exercise habits: walking dog -Educated on Cholesterol goals;  Benefits of statin for ASCVD risk reduction; -Counseled on diet and exercise extensively Recommended to continue current medication  CAD (Goal: prevent heart attacks and strokes) -Controlled -Current treatment  Nitroglycerin 0.4 mg as needed for chest pain - Appropriate, Effective, Safe, Accessible Atorvastatin 40 mg daily - Appropriate, Effective, Safe, Accessible Aspirin 81 mg daily - Appropriate, Effective, Safe, Accessible -Medications previously tried: none  -Recommended to continue current medication  Aortic valve replacement (Goal: prevent clot formation) -Controlled -Current treatment  Warfarin 5 mg every Sun and 10 mg all other days - Appropriate, Effective, Safe, Accessible -Medications previously tried: none  -Counseled on monitoring for signs of bleeding such as unexplained and excessive bleeding from a cut or injury, easy or excessive bruising, blood in urine or stools, and nosebleeds without a known cause   Diabetes (A1c goal <7%) -Controlled -Current medications: rrent medications: Metformin 500 mg 1/2 tablet daily - Appropriate, Effective, Safe, Accessible -Medications previously tried: glipizide (weakness/shakiness with borderline hypoglycemia) -Current home glucose  readings: checks twice weekly  fasting glucose: 122 (highest it's been in a while), 92, 107, 89 (normal) post prandial glucose: none -Denies hypoglycemic/hyperglycemic symptoms -Current meal patterns:  breakfast: one slice of bread lunch: did not discuss  dinner:  did not discuss  snacks: not a lot of sweets drinks: water, occasional tea or decaf coffee with artificial sweetener -Current exercise: takes dog out about 4-5 times a day and walks the yard every time -Educated on Exercise goal of 150 minutes per week; Carbohydrate counting and/or plate method -Counseled to check feet daily and get yearly eye exams -Counseled on diet and exercise extensively Recommended to continue current medication  Heart Failure (Goal: manage symptoms and prevent exacerbations) -Controlled -Last ejection fraction: 55%  -HF type: Diastolic -NYHA Class: II (slight limitation of activity) -AHA HF Stage: B (Heart disease present - no symptoms present) -Current treatment: Furosemide 40 mg once daily - Appropriate, Effective, Safe, Accessible Metoprolol succinate 100 mg daily - Appropriate, Effective, Safe, Accessible Potassium chloride 10 MEQ once daily - Appropriate, Effective, Safe, Accessible -Medications previously tried: none  -Current home BP/HR readings: 120-130s -Current dietary habits: limits salt intake -Current exercise habits: walks dog daily -Educated on Importance of weighing daily; if you gain more than 3 pounds in one day or 5 pounds in one week, call cardiologist Importance of blood pressure control -Counseled on diet and exercise extensively Recommended to continue current medication  Parkinson's disease (Goal: minimize symptoms) -Controlled -Current treatment  Sinemet 25/100 mg, 1 tablet three times daily - Appropriate, Effective, Safe, Accessible -Medications previously tried: Sinemet 4 times daily (hallucinations and GI upset), Requip (ankle swelling), Mirapex  (hallucinations) -Recommended to continue current medication  Asthma (Goal: control symptoms) -Controlled -Current treatment  Albuterol two puffs every 6 hours as needed for wheezing or shortness of breath - Appropriate, Effective, Safe, Accessible -Medications previously tried: none  -Pulmonary function testing: none -Patient denies consistent use of maintenance inhaler -Frequency of rescue inhaler use: using it once a week; gets worse during spring time and fall -Counseled on When to use rescue inhaler Differences between maintenance and rescue inhalers -Recommended to continue current medication Consider Symbicort prescription if patient uses albuterol more frequently.  Allergic rhinitis (Goal: minimize symptoms) -Controlled -Current treatment  Cetirizine 10 mg daily (morning) - Appropriate, Effective, Safe, Accessible Flonase 2 sprays into both nostrils daily as needed - Appropriate, Effective, Safe, Accessible -Medications previously tried: none  -Recommended to continue current medication  Gout (Goal: prevent flare ups) -Controlled -Current treatment  Tart cherry two tablets once daily - Appropriate, Effective, Safe, Accessible -Medications previously tried: none  -Recommended to continue current medication  Osteoarthritis (Goal: minimize pain) -Controlled -Current treatment  Acetaminophen '650mg'$ , 2 tablets twice daily - Appropriate, Effective, Safe, Accessible -Medications previously tried: none  -Recommended to continue current medication  Health Maintenance -Vaccine gaps: COVID vaccines (patient declined vaccine) -Current therapy:  Vitamin C 1000 mg once daily  Vitamin B12 1000 mg daily (morning)  Phillips colon health daily (breakfast) Multivitamin daily (morning)  Calcium carbonate 600 mg twice daily with meals Vitamin D 1000 units, 1 tablet once daily  -Educated on Cost vs benefit of each product must be carefully weighed by individual consumer -Patient is  satisfied with current therapy and denies issues -Recommended to continue current medication  Patient Goals/Self-Care Activities Patient will:  - take medications as prescribed check blood pressure weekly, document, and provide at future appointments weigh daily, and contact provider if weight gain of > 3 lbs in one day or > 5 lbs in one week  Follow Up Plan: Telephone follow up appointment with care management team member scheduled for: 6 months       Patient verbalizes understanding of instructions and care plan provided today  and agrees to view in [place]. Active MyChart status and patient understanding of how to access instructions and care plan via MyChart confirmed with patient.    Telephone follow up appointment with pharmacy team member scheduled for: 1 year  Viona Gilmore, [facility]

## 2022-04-30 ENCOUNTER — Inpatient Hospital Stay: Admission: RE | Admit: 2022-04-30 | Payer: Medicare HMO | Source: Ambulatory Visit

## 2022-05-03 ENCOUNTER — Ambulatory Visit (HOSPITAL_COMMUNITY)
Admission: RE | Admit: 2022-05-03 | Discharge: 2022-05-03 | Disposition: A | Discharge status: Home / Self Care | Payer: Medicare HMO | Source: Ambulatory Visit | Attending: Cardiology | Admitting: Cardiology

## 2022-05-03 DIAGNOSIS — Z8679 Personal history of other diseases of the circulatory system: Secondary | ICD-10-CM | POA: Diagnosis not present

## 2022-05-03 DIAGNOSIS — I77810 Thoracic aortic ectasia: Secondary | ICD-10-CM | POA: Diagnosis not present

## 2022-05-03 DIAGNOSIS — J8410 Pulmonary fibrosis, unspecified: Secondary | ICD-10-CM | POA: Diagnosis not present

## 2022-05-03 DIAGNOSIS — Z9889 Other specified postprocedural states: Secondary | ICD-10-CM | POA: Diagnosis not present

## 2022-05-03 LAB — POCT I-STAT CREATININE: Creatinine, Ser: 0.9 mg/dL (ref 0.61–1.24)

## 2022-05-03 MED ORDER — IOHEXOL 350 MG/ML SOLN
75.0000 mL | Freq: Once | INTRAVENOUS | Status: AC | PRN
  Administered 2022-05-03: 75 mL via INTRAVENOUS

## 2022-05-06 ENCOUNTER — Ambulatory Visit (INDEPENDENT_AMBULATORY_CARE_PROVIDER_SITE_OTHER): Payer: Medicare HMO

## 2022-05-06 DIAGNOSIS — I442 Atrioventricular block, complete: Secondary | ICD-10-CM | POA: Diagnosis not present

## 2022-05-06 LAB — CUP PACEART REMOTE DEVICE CHECK
Battery Remaining Longevity: 55 mo
Battery Remaining Percentage: 45 %
Battery Voltage: 2.96 V
Brady Statistic AP VP Percent: 30 %
Brady Statistic AP VS Percent: 1 %
Brady Statistic AS VP Percent: 69 %
Brady Statistic AS VS Percent: 1 %
Brady Statistic RA Percent Paced: 30 %
Brady Statistic RV Percent Paced: 99 %
Date Time Interrogation Session: 20231026020014
Implantable Lead Connection Status: 753985
Implantable Lead Connection Status: 753985
Implantable Lead Implant Date: 20050816
Implantable Lead Implant Date: 20050816
Implantable Lead Location: 753859
Implantable Lead Location: 753860
Implantable Pulse Generator Implant Date: 20171026
Lead Channel Impedance Value: 480 Ohm
Lead Channel Impedance Value: 680 Ohm
Lead Channel Pacing Threshold Amplitude: 0.5 V
Lead Channel Pacing Threshold Amplitude: 1 V
Lead Channel Pacing Threshold Pulse Width: 0.5 ms
Lead Channel Pacing Threshold Pulse Width: 0.5 ms
Lead Channel Sensing Intrinsic Amplitude: 12 mV
Lead Channel Sensing Intrinsic Amplitude: 2.4 mV
Lead Channel Setting Pacing Amplitude: 0.75 V
Lead Channel Setting Pacing Amplitude: 2 V
Lead Channel Setting Pacing Pulse Width: 0.5 ms
Lead Channel Setting Sensing Sensitivity: 4 mV
Pulse Gen Model: 2272
Pulse Gen Serial Number: 3182792

## 2022-05-07 ENCOUNTER — Telehealth: Payer: Self-pay | Admitting: *Deleted

## 2022-05-07 DIAGNOSIS — I77810 Thoracic aortic ectasia: Secondary | ICD-10-CM

## 2022-05-07 NOTE — Telephone Encounter (Signed)
4.5 cm ascending aorta. Stable from prior CT.  Repeat in one year.  Candee Furbish, MD

## 2022-05-10 ENCOUNTER — Encounter: Payer: Self-pay | Admitting: Physician Assistant

## 2022-05-10 ENCOUNTER — Ambulatory Visit: Payer: Medicare HMO | Attending: Physician Assistant | Admitting: Physician Assistant

## 2022-05-10 VITALS — BP 132/72 | HR 66 | Ht 66.0 in | Wt 207.0 lb

## 2022-05-10 DIAGNOSIS — I2510 Atherosclerotic heart disease of native coronary artery without angina pectoris: Secondary | ICD-10-CM | POA: Diagnosis not present

## 2022-05-10 DIAGNOSIS — Z8679 Personal history of other diseases of the circulatory system: Secondary | ICD-10-CM | POA: Diagnosis not present

## 2022-05-10 DIAGNOSIS — I10 Essential (primary) hypertension: Secondary | ICD-10-CM

## 2022-05-10 DIAGNOSIS — I77810 Thoracic aortic ectasia: Secondary | ICD-10-CM

## 2022-05-10 DIAGNOSIS — Z7901 Long term (current) use of anticoagulants: Secondary | ICD-10-CM | POA: Diagnosis not present

## 2022-05-10 DIAGNOSIS — I5032 Chronic diastolic (congestive) heart failure: Secondary | ICD-10-CM

## 2022-05-10 DIAGNOSIS — Z9889 Other specified postprocedural states: Secondary | ICD-10-CM

## 2022-05-10 DIAGNOSIS — E785 Hyperlipidemia, unspecified: Secondary | ICD-10-CM | POA: Diagnosis not present

## 2022-05-10 DIAGNOSIS — Z952 Presence of prosthetic heart valve: Secondary | ICD-10-CM | POA: Diagnosis not present

## 2022-05-10 NOTE — Progress Notes (Signed)
Cardiology Office Note:    Date:  05/10/2022   ID:  [name], DOB 26-Aug-1945, MRN [id number]  PCP:  [name], NP  [facility] Cardiologist:  Candee Furbish, MD  [facility] Electrophysiologist:  Cristopher Peru, MD   Chief Complaint: yearly follow up   History of Present Illness:    [name] is a 76 y.o. male with a hx of  CAD (s/p CABG x 2 w/ LIMA-LAD and SVG-Cx in 2005), aortic stenosis (s/p Bentall aortic root replacement w/ St.Jude mechanical valve), CHB (s/p PPM 2005), Type 2 DM, HTN, HLD, and GERD seen for follow up.   Stress test 01/2013: low risk  Echo 01/2016: LVEF 55% Echo 04/2020: LVEF 50-55% and grade 1 DD Echo  04/2021: LVEF 45-50%, grade 1 DD, stable mechanical aortic valve  CT angio of chest aorta 05/03/2022: Unchanged fusiform aneurysmal dilation of the distal ascending thoracic aorta measuring up to 4.5 cm.  Patient is here for follow-up.  No complaints.  No regular exercise due to chronic arthritis but stays active doing yard work and walking the dog.  Denies chest pain, shortness of breath, orthopnea, PND, syncope, lower extremity edema or melena.   Past Medical History:  Diagnosis Date   Acute renal failure ([place]) 02/09/2013   Aortic stenosis 08/25/2010   a. Bentall aortic root replacement with a St. Jude mechanical valve and Hemashield conduit 02/2004.   Arthritis    Asthma    AV BLOCK, COMPLETE    a. s/p St Jude dual chamber pacemaker 02/2004.   CAD (coronary artery disease)    a. s/p CABGx2 (LIMA-dLAD, SVG-Cx). b. Low risk nuc 12/2012 without ischemia, EF 46% mild apical hypokinesia (EF 55% inf HK by echo).   Carotid artery disease (HCC)    a. 0-39% bilateral ICA stenosis, stable mild hard plaque in carotid bulbs. F/u 03/2014 recommended.   Diabetes mellitus without complication ([place])    type 2    DIVERTICULOSIS, COLON 10/17/2007   Ejection fraction    a. EF 55% with inf HK, mild MR by echo 12/2012.   GERD (gastroesophageal reflux disease)     hxof    GOUT 01/16/2007   Headache    hx of migraines    HEMORRHOIDS, INTERNAL 11/01/2008   Hx of adenomatous polyp of colon 02/2019   no recall needed   HYPERLIPIDEMIA 01/16/2007   HYPERTENSION 01/16/2007   Impaired glucose tolerance    LEG EDEMA 11/27/2008   Special screening for malignant neoplasm of prostate    Warfarin anticoagulation    AVR    Past Surgical History:  Procedure Laterality Date   AORTIC VALVE REPLACEMENT     CARDIAC CATHETERIZATION  06/2003   CIRCUMCISION     COLONOSCOPY     CORONARY ARTERY BYPASS GRAFT     coronary stents      prior to bypass    EP IMPLANTABLE DEVICE N/A 05/06/2016   Procedure: [place];  Surgeon: Evans Lance, MD;  Location: [place] [facility];  Service: Cardiovascular;  Laterality: N/A;   ESOPHAGOGASTRODUODENOSCOPY     HERNIA REPAIR     INGUINAL HERNIA REPAIR Bilateral 02/07/2013   Procedure: LAPAROSCOPIC BILATERAL INGUINAL HERNIA REPAIR;  Surgeon: Imogene Burn. Georgette Dover, MD;  Location: [facility];  Service: General;  Laterality: Bilateral;   INSERTION OF MESH Bilateral 02/07/2013   Procedure: INSERTION OF MESH;  Surgeon: Imogene Burn. Georgette Dover, MD;  Location: [facility];  Service: General;  Laterality: Bilateral;   LUMBAR LAMINECTOMY/DECOMPRESSION MICRODISCECTOMY  N/A 03/15/2018   Procedure: Complete decompressive lumbar laminectomy for spinal stenosis of L4-L5 and foraminotomy for L4-L5 root on right;  Surgeon: [name], MD;  Location: [facility];  Service: Orthopedics;  Laterality: N/A;   PACEMAKER INSERTION     SHOULDER SURGERY Bilateral    SKIN BIOPSY  05/04/2019   BASAL CELL CARCINOMA//MID CENTRAL NASAL   SKIN BIOPSY  10/11/2019   Superficial basal cell carcinoma - left superior central forehead shave    Current Medications: Current Meds  Medication Sig   Accu-Chek FastClix Lancets MISC USE TO CHECK BLOOD SUGAR TWICE A DAY AND AS NEEDED   acetaminophen (TYLENOL) 650 MG CR tablet Take 1,300 mg by mouth 2 (two) times daily.   albuterol  (VENTOLIN HFA) 108 (90 Base) MCG/ACT inhaler INHALE 2 PUFFS INTO THE LUNGS EVERY 6 HOURS AS NEEDED FOR WHEEZING OR SHORTNESS OF BREATH.   Ascorbic Acid (VITAMIN C) 1000 MG tablet Take 1,000 mg by mouth daily.   aspirin 81 MG tablet Take 81 mg by mouth every evening.    atorvastatin (LIPITOR) 40 MG tablet TAKE 1 TABLET EVERY DAY AT 6 PM.   Blood Glucose Monitoring Suppl (ACCU-CHEK GUIDE) w/Device KIT Use as directed   calcium carbonate (OS-CAL) 600 MG TABS Take 600 mg by mouth 2 (two) times daily with a meal.   carbidopa-levodopa (SINEMET CR) 50-200 MG tablet Take 1 tablet by mouth at bedtime.   carbidopa-levodopa (SINEMET IR) 25-100 MG tablet Take 1 tablet by mouth 3 (three) times daily.    cetirizine (ZYRTEC) 10 MG tablet Take 10 mg by mouth daily.    cholecalciferol (VITAMIN D) 25 MCG (1000 UNIT) tablet Take 1 tablet by mouth daily.   fluticasone (FLONASE) 50 MCG/ACT nasal spray Place 2 sprays into both nostrils daily.   furosemide (LASIX) 40 MG tablet TAKE 1 TABLET EVERY DAY   glucose blood (ACCU-CHEK SMARTVIEW) test strip Use as instructed   guaiFENesin-dextromethorphan (ROBITUSSIN DM) 100-10 MG/5ML syrup Take 5 mLs by mouth every 4 (four) hours as needed for cough.   losartan (COZAAR) 100 MG tablet TAKE 1 TABLET EVERY DAY (PLEASE MAKE OVERDUE APPT WITH DR. Lovena Le BEFORE ANY MORE REFILLS, 2ND ATTEMPT, CALL [phone])   melatonin 5 MG TABS Take 5 mg by mouth at bedtime.   metFORMIN (GLUCOPHAGE) 500 MG tablet TAKE 1/2 TABLET EVERY DAY WITH BREAKFAST - SCHEDULE APPT FOR FUTURE REFILLS   metoprolol succinate (TOPROL-XL) 100 MG 24 hr tablet TAKE 1 TABLET EVERY DAY   Misc Natural Products (TART CHERRY ADVANCED) CAPS Take 2 capsules by mouth daily.    Multiple Vitamin (MULTIVITAMIN) tablet Take 1 tablet by mouth daily.     nitroGLYCERIN (NITROSTAT) 0.4 MG SL tablet Place 1 tablet (0.4 mg total) under the tongue every 5 (five) minutes as needed. For chest pain   potassium chloride (KLOR-CON) 10  MEQ tablet TAKE 1 TABLET EVERY DAY   Probiotic Product (PHILLIPS COLON HEALTH PO) Take 1 capsule by mouth daily.   vitamin B-12 (CYANOCOBALAMIN) 1000 MCG tablet Take 1,000 mcg by mouth daily.   warfarin (COUMADIN) 10 MG tablet TAKE 1/2 TO 1 TABLET DAILY OR AS DIRECTED BY COUMADIN CLINIC     Allergies:   Bee venom, Codeine phosphate, Metoprolol tartrate, Pramipexole, Tramadol, and Ramipril   Social History   Socioeconomic History   Marital status: Married    Spouse name: Not on file   Number of children: 3   Years of education: Not on file   Highest education level: Not  on file  Occupational History   Occupation: retired  Tobacco Use   Smoking status: Former    Types: Cigarettes    Quit date: 07/12/1973    Years since quitting: 48.8   Smokeless tobacco: Never  Vaping Use   Vaping Use: Never used  Substance and Sexual Activity   Alcohol use: No   Drug use: No   Sexual activity: Not on file  Other Topics Concern   Not on file  Social History Narrative   The patient is married and retired   He reports 3 grown children   Former smoker, no alcohol or tobacco or drug use at this time   Social Determinants of Radio broadcast assistant Strain: Low Risk  (06/16/2021)   Overall Financial Resource Strain (CARDIA)    Difficulty of Paying Living Expenses: Not hard at all  Food Insecurity: No Food Insecurity (06/16/2021)   Hunger Vital Sign    Worried About Running Out of Food in the Last Year: Never true    [place] in the Last Year: Never true  Transportation Needs: No Transportation Needs (04/26/2022)   PRAPARE - Hydrologist (Medical): No    Lack of Transportation (Non-Medical): No  Physical Activity: Unknown (09/15/2021)   Exercise Vital Sign    Days of Exercise per Week: Patient refused    Minutes of Exercise per Session: 60 min  Stress: No Stress Concern Present (09/15/2021)   [place]    Feeling of Stress : Not at all  Social Connections: [place] (09/15/2021)   Social Connection and Isolation Panel [NHANES]    Frequency of Communication with Friends and Family: More than three times a week    Frequency of Social Gatherings with Friends and Family: More than three times a week    Attends Religious Services: More than 4 times per year    Active Member of [employer] or Organizations: No    Attends Music therapist: More than 4 times per year    Marital Status: Married     Family History: The patient's family history includes Diabetes in his brother, brother, mother, and sister; Epilepsy in his sister; Heart attack in his father; Hypertension in his mother; Kidney cancer in his father. There is no history of Colon cancer, Rectal cancer, Stomach cancer, or Esophageal cancer.    ROS:   Please see the history of present illness.    All other systems reviewed and are negative.   EKGs/Labs/Other Studies Reviewed:    The following studies were reviewed today:  Echo 04/2021 1. Distal septal hypokinesis Inferior wall hypokinesis . Left ventricular  ejection fraction, by estimation, is 45 to 50%. The left ventricle has  mildly decreased function. The left ventricle demonstrates regional wall  motion abnormalities (see scoring  diagram/findings for description). The left ventricular internal cavity  size was mildly dilated. There is mild left ventricular hypertrophy. Left  ventricular diastolic parameters are consistent with Grade I diastolic  dysfunction (impaired relaxation).   2. Pacing wires in RA/RV. Right ventricular systolic function is normal.  The right ventricular size is normal. There is normal pulmonary artery  systolic pressure.   3. The mitral valve is abnormal. Mild mitral valve regurgitation. No  evidence of mitral stenosis.   4. Appears to be both central and PVL Post mechanical AVR replacement ST  Jude with Bental stable  low gradients in systole . The  aortic valve has  been repaired/replaced. Aortic valve regurgitation is mild. No aortic  stenosis is present. Procedure Date:  02/2004.   5. The inferior vena cava is normal in size with greater than 50%  respiratory variability, suggesting right atrial pressure of 3 mmHg.   EKG:  EKG is  ordered today.  The ekg ordered today demonstrates paced rhythm  Recent Labs: 05/03/2022: Creatinine, Ser 0.90  Recent Lipid Panel    Component Value Date/Time   CHOL 142 04/09/2021 0958   TRIG 124.0 04/09/2021 0958   TRIG 91 06/14/2006 0825   HDL 54.00 04/09/2021 0958   CHOLHDL 3 04/09/2021 0958   VLDL 24.8 04/09/2021 0958   LDLCALC 63 04/09/2021 0958   Physical Exam:    VS:  BP (!) 140/70 (BP Location: Left Arm, Patient Position: Sitting, Cuff Size: Normal)   Pulse 66   Ht _0  (1.676 m)   Wt 207 lb (93.9 kg)   BMI 33.41 kg/m     Wt Readings from Last 3 Encounters:  05/10/22 207 lb (93.9 kg)  11/04/21 203 lb (92.1 kg)  09/15/21 203 lb (92.1 kg)     GEN:  Well nourished, well developed in no acute distress HEENT: Normal NECK: No JVD; No carotid bruits LYMPHATICS: No lymphadenopathy CARDIAC: RRR, no murmurs, rubs, gallops RESPIRATORY:  Clear to auscultation without rales, wheezing or rhonchi  ABDOMEN: Soft, non-tender, non-distended MUSCULOSKELETAL:  No edema; No deformity  SKIN: Warm and dry NEUROLOGIC:  Alert and oriented x 3 PSYCHIATRIC:  Normal affect   ASSESSMENT AND PLAN:    CAD s/p CABG No angina.  Continue aspirin, statin and beta-blocker.  2. Aortic stenosis s/p Bentall aortic root replacement w/ St.Jude mechanical valve in 2005 - CT angio of chest aorta 05/03/2022: Unchanged fusiform aneurysmal dilation of the distal ascending thoracic aorta measuring up to 4.5 cm. - On coumadin for anticoagulation    3. HTN -Blood pressure control on current medication.  No change.  4. HLD -Continue statin.  Medication Adjustments/Labs and  Tests Ordered: Current medicines are reviewed at length with the patient today.  Concerns regarding medicines are outlined above.  Orders Placed This Encounter  Procedures   EKG 12-Lead   No orders of the defined types were placed in this encounter.   Patient Instructions  Medication Instructions:  Your physician recommends that you continue on your current medications as directed. Please refer to the Current Medication list given to you today. *If you need a refill on your cardiac medications before your next appointment, please call your pharmacy*   Lab Work: None Ordered   Testing/Procedures: None Ordered   Follow-Up: At [facility], you and your health needs are our priority.  As part of our continuing mission to provide you with exceptional heart care, we have created designated Provider Care Teams.  These Care Teams include your primary Cardiologist (physician) and Advanced Practice Providers (APPs -  Physician Assistants and Nurse Practitioners) who all work together to provide you with the care you need, when you need it.  We recommend signing up for the patient portal called "MyChart".  Sign up information is provided on this After Visit Summary.  MyChart is used to connect with patients for Virtual Visits (Telemedicine).  Patients are able to view lab/test results, encounter notes, upcoming appointments, etc.  Non-urgent messages can be sent to your provider as well.   To learn more about what you can do with MyChart, go to [email or web address].    Your  next appointment:   12 month(s)  The format for your next appointment:   In Person  Provider:   Candee Furbish, MD     Other Instructions   Important Information About Sugar         Signed, Leanor Kail, [place]  05/10/2022 4:01 PM    [facility]

## 2022-05-10 NOTE — Patient Instructions (Addendum)
Medication Instructions:  Your physician recommends that you continue on your current medications as directed. Please refer to the Current Medication list given to you today. *If you need a refill on your cardiac medications before your next appointment, please call your pharmacy*   Lab Work: None Ordered   Testing/Procedures: None Ordered   Follow-Up: At [facility], you and your health needs are our priority.  As part of our continuing mission to provide you with exceptional heart care, we have created designated Provider Care Teams.  These Care Teams include your primary Cardiologist (physician) and Advanced Practice Providers (APPs -  Physician Assistants and Nurse Practitioners) who all work together to provide you with the care you need, when you need it.  We recommend signing up for the patient portal called "MyChart".  Sign up information is provided on this After Visit Summary.  MyChart is used to connect with patients for Virtual Visits (Telemedicine).  Patients are able to view lab/test results, encounter notes, upcoming appointments, etc.  Non-urgent messages can be sent to your provider as well.   To learn more about what you can do with MyChart, go to [email or web address].    Your next appointment:   12 month(s)  The format for your next appointment:   In Person  Provider:   Candee Furbish, MD     Other Instructions   Important Information About Sugar

## 2022-05-11 DIAGNOSIS — M1990 Unspecified osteoarthritis, unspecified site: Secondary | ICD-10-CM

## 2022-05-11 DIAGNOSIS — I110 Hypertensive heart disease with heart failure: Secondary | ICD-10-CM | POA: Diagnosis not present

## 2022-05-11 DIAGNOSIS — G20C Parkinsonism, unspecified: Secondary | ICD-10-CM

## 2022-05-11 DIAGNOSIS — E785 Hyperlipidemia, unspecified: Secondary | ICD-10-CM | POA: Diagnosis not present

## 2022-05-11 DIAGNOSIS — E1159 Type 2 diabetes mellitus with other circulatory complications: Secondary | ICD-10-CM

## 2022-05-11 DIAGNOSIS — J45909 Unspecified asthma, uncomplicated: Secondary | ICD-10-CM

## 2022-05-11 DIAGNOSIS — I5030 Unspecified diastolic (congestive) heart failure: Secondary | ICD-10-CM | POA: Diagnosis not present

## 2022-05-11 DIAGNOSIS — Z7984 Long term (current) use of oral hypoglycemic drugs: Secondary | ICD-10-CM | POA: Diagnosis not present

## 2022-05-11 DIAGNOSIS — I2510 Atherosclerotic heart disease of native coronary artery without angina pectoris: Secondary | ICD-10-CM | POA: Diagnosis not present

## 2022-05-14 ENCOUNTER — Ambulatory Visit: Payer: Medicare HMO | Attending: Cardiovascular Disease

## 2022-05-14 DIAGNOSIS — Z5181 Encounter for therapeutic drug level monitoring: Secondary | ICD-10-CM

## 2022-05-14 DIAGNOSIS — Z09 Encounter for follow-up examination after completed treatment for conditions other than malignant neoplasm: Secondary | ICD-10-CM | POA: Diagnosis not present

## 2022-05-14 DIAGNOSIS — Z952 Presence of prosthetic heart valve: Secondary | ICD-10-CM | POA: Diagnosis not present

## 2022-05-14 LAB — POCT INR: INR: 2.3 (ref 2.0–3.0)

## 2022-05-14 NOTE — Progress Notes (Signed)
Remote pacemaker transmission.   

## 2022-05-14 NOTE — Patient Instructions (Signed)
Continue taking warfarin 1 tablet daily except for 1/2 tablet on SUNDAYS, [place]. Remain consistent with green leafy veggies. Recheck INR in 6 weeks. Coumadin Clinic [phone]

## 2022-05-17 DIAGNOSIS — G20A1 Parkinson's disease without dyskinesia, without mention of fluctuations: Secondary | ICD-10-CM | POA: Diagnosis not present

## 2022-05-17 DIAGNOSIS — G629 Polyneuropathy, unspecified: Secondary | ICD-10-CM | POA: Diagnosis not present

## 2022-05-18 ENCOUNTER — Other Ambulatory Visit: Payer: Self-pay | Admitting: Adult Health

## 2022-05-18 DIAGNOSIS — I10 Essential (primary) hypertension: Secondary | ICD-10-CM

## 2022-05-18 DIAGNOSIS — E119 Type 2 diabetes mellitus without complications: Secondary | ICD-10-CM

## 2022-05-24 ENCOUNTER — Telehealth: Payer: Self-pay | Admitting: Cardiology

## 2022-05-24 NOTE — Telephone Encounter (Signed)
Pt spouse calls in to report pt CP/ discomfort, excessive sweating and SOB that lasted for about 30 min.  Reports pt has an artifical valve and when it's cold outside pt c/o discomfort when breathing in cold air.  Today pt in house c/o chest discomfort and sweating pt hair is soaked. Pt came on the line reports chest feels cold, drank black coffee and placed a heating pad to chest and is starting to feel better.   Wife reports BP left arm 170/106-87 right arm 172/107-89.  Pt has not taken morning meds but BP is never this high.   Pt did not take PRN NTG although reports has med.  Advised pt if chest discomfort occurs again to take a NTG.  Advised pt to report to ED for evaluation.

## 2022-05-24 NOTE — Telephone Encounter (Signed)
Pt c/o of Chest Pain: STAT if CP now or developed within 24 hours  1. Are you having CP right now? Yes   2. Are you experiencing any other symptoms (ex. SOB, nausea, vomiting, sweating)? Sweating, little SOB   3. How long have you been experiencing CP? Half hour   4. Is your CP continuous or coming and going? Continuous   5. Have you taken Nitroglycerin? No   Bp- 170/106 hr87  172/107 hr89  ?

## 2022-05-25 ENCOUNTER — Other Ambulatory Visit: Payer: Self-pay

## 2022-05-25 ENCOUNTER — Encounter (HOSPITAL_COMMUNITY): Payer: Self-pay | Admitting: Emergency Medicine

## 2022-05-25 ENCOUNTER — Inpatient Hospital Stay (HOSPITAL_COMMUNITY)
Admission: EM | Admit: 2022-05-25 | Discharge: 2022-05-29 | DRG: 324 | Disposition: A | Discharge status: Home / Self Care | Payer: Medicare HMO | Attending: Internal Medicine | Admitting: Internal Medicine

## 2022-05-25 ENCOUNTER — Other Ambulatory Visit: Payer: Self-pay | Admitting: Physician Assistant

## 2022-05-25 ENCOUNTER — Emergency Department (HOSPITAL_COMMUNITY): Payer: Medicare HMO

## 2022-05-25 DIAGNOSIS — I252 Old myocardial infarction: Secondary | ICD-10-CM

## 2022-05-25 DIAGNOSIS — Z950 Presence of cardiac pacemaker: Secondary | ICD-10-CM

## 2022-05-25 DIAGNOSIS — I110 Hypertensive heart disease with heart failure: Secondary | ICD-10-CM | POA: Diagnosis not present

## 2022-05-25 DIAGNOSIS — M109 Gout, unspecified: Secondary | ICD-10-CM | POA: Diagnosis present

## 2022-05-25 DIAGNOSIS — Z79899 Other long term (current) drug therapy: Secondary | ICD-10-CM

## 2022-05-25 DIAGNOSIS — I25110 Atherosclerotic heart disease of native coronary artery with unstable angina pectoris: Secondary | ICD-10-CM | POA: Diagnosis not present

## 2022-05-25 DIAGNOSIS — G20A1 Parkinson's disease without dyskinesia, without mention of fluctuations: Secondary | ICD-10-CM | POA: Diagnosis not present

## 2022-05-25 DIAGNOSIS — M1990 Unspecified osteoarthritis, unspecified site: Secondary | ICD-10-CM | POA: Diagnosis present

## 2022-05-25 DIAGNOSIS — Z833 Family history of diabetes mellitus: Secondary | ICD-10-CM | POA: Diagnosis not present

## 2022-05-25 DIAGNOSIS — I25700 Atherosclerosis of coronary artery bypass graft(s), unspecified, with unstable angina pectoris: Secondary | ICD-10-CM | POA: Diagnosis present

## 2022-05-25 DIAGNOSIS — Z7984 Long term (current) use of oral hypoglycemic drugs: Secondary | ICD-10-CM | POA: Diagnosis not present

## 2022-05-25 DIAGNOSIS — Z8249 Family history of ischemic heart disease and other diseases of the circulatory system: Secondary | ICD-10-CM

## 2022-05-25 DIAGNOSIS — E119 Type 2 diabetes mellitus without complications: Secondary | ICD-10-CM | POA: Diagnosis present

## 2022-05-25 DIAGNOSIS — Z7901 Long term (current) use of anticoagulants: Secondary | ICD-10-CM

## 2022-05-25 DIAGNOSIS — I214 Non-ST elevation (NSTEMI) myocardial infarction: Principal | ICD-10-CM | POA: Diagnosis present

## 2022-05-25 DIAGNOSIS — I10 Essential (primary) hypertension: Secondary | ICD-10-CM | POA: Diagnosis present

## 2022-05-25 DIAGNOSIS — I5022 Chronic systolic (congestive) heart failure: Secondary | ICD-10-CM | POA: Diagnosis not present

## 2022-05-25 DIAGNOSIS — Z7982 Long term (current) use of aspirin: Secondary | ICD-10-CM | POA: Diagnosis not present

## 2022-05-25 DIAGNOSIS — Z888 Allergy status to other drugs, medicaments and biological substances status: Secondary | ICD-10-CM

## 2022-05-25 DIAGNOSIS — K219 Gastro-esophageal reflux disease without esophagitis: Secondary | ICD-10-CM | POA: Diagnosis present

## 2022-05-25 DIAGNOSIS — I712 Thoracic aortic aneurysm, without rupture, unspecified: Secondary | ICD-10-CM | POA: Diagnosis present

## 2022-05-25 DIAGNOSIS — R079 Chest pain, unspecified: Secondary | ICD-10-CM | POA: Diagnosis not present

## 2022-05-25 DIAGNOSIS — E785 Hyperlipidemia, unspecified: Secondary | ICD-10-CM | POA: Diagnosis not present

## 2022-05-25 DIAGNOSIS — E669 Obesity, unspecified: Secondary | ICD-10-CM | POA: Diagnosis present

## 2022-05-25 DIAGNOSIS — Z86010 Personal history of colonic polyps: Secondary | ICD-10-CM

## 2022-05-25 DIAGNOSIS — Z951 Presence of aortocoronary bypass graft: Secondary | ICD-10-CM

## 2022-05-25 DIAGNOSIS — Z87891 Personal history of nicotine dependence: Secondary | ICD-10-CM | POA: Diagnosis not present

## 2022-05-25 DIAGNOSIS — Z91030 Bee allergy status: Secondary | ICD-10-CM

## 2022-05-25 DIAGNOSIS — I2510 Atherosclerotic heart disease of native coronary artery without angina pectoris: Secondary | ICD-10-CM | POA: Diagnosis not present

## 2022-05-25 DIAGNOSIS — Z885 Allergy status to narcotic agent status: Secondary | ICD-10-CM

## 2022-05-25 DIAGNOSIS — Z952 Presence of prosthetic heart valve: Secondary | ICD-10-CM

## 2022-05-25 DIAGNOSIS — Z6833 Body mass index (BMI) 33.0-33.9, adult: Secondary | ICD-10-CM

## 2022-05-25 DIAGNOSIS — Z955 Presence of coronary angioplasty implant and graft: Secondary | ICD-10-CM

## 2022-05-25 DIAGNOSIS — I442 Atrioventricular block, complete: Secondary | ICD-10-CM | POA: Diagnosis present

## 2022-05-25 DIAGNOSIS — Z85828 Personal history of other malignant neoplasm of skin: Secondary | ICD-10-CM

## 2022-05-25 LAB — PROTIME-INR
INR: 2.1 — ABNORMAL HIGH (ref 0.8–1.2)
Prothrombin Time: 23 seconds — ABNORMAL HIGH (ref 11.4–15.2)

## 2022-05-25 LAB — COMPREHENSIVE METABOLIC PANEL
ALT: 10 U/L (ref 0–44)
AST: 37 U/L (ref 15–41)
Albumin: 3.8 g/dL (ref 3.5–5.0)
Alkaline Phosphatase: 47 U/L (ref 38–126)
Anion gap: 9 (ref 5–15)
BUN: 14 mg/dL (ref 8–23)
CO2: 26 mmol/L (ref 22–32)
Calcium: 9.2 mg/dL (ref 8.9–10.3)
Chloride: 106 mmol/L (ref 98–111)
Creatinine, Ser: 0.84 mg/dL (ref 0.61–1.24)
GFR, Estimated: >60 mL/min (ref >60)
Glucose, Bld: 131 mg/dL — ABNORMAL HIGH (ref 70–99)
Potassium: 4 mmol/L (ref 3.5–5.1)
Sodium: 141 mmol/L (ref 135–145)
Total Bilirubin: 0.8 mg/dL (ref 0.3–1.2)
Total Protein: 6 g/dL — ABNORMAL LOW (ref 6.5–8.1)

## 2022-05-25 LAB — CBC
HCT: 44.6 % (ref 39.0–52.0)
Hemoglobin: 14.2 g/dL (ref 13.0–17.0)
MCH: 29.3 pg (ref 26.0–34.0)
MCHC: 31.8 g/dL (ref 30.0–36.0)
MCV: 92 fL (ref 80.0–100.0)
Platelets: 228 10*3/uL (ref 150–400)
RBC: 4.85 MIL/uL (ref 4.22–5.81)
RDW: 14.8 % (ref 11.5–15.5)
WBC: 7.8 10*3/uL (ref 4.0–10.5)
nRBC: 0 % (ref 0.0–0.2)

## 2022-05-25 LAB — TROPONIN I (HIGH SENSITIVITY)
Troponin I (High Sensitivity): 143 ng/L (ref <18)
Troponin I (High Sensitivity): 156 ng/L (ref <18)
Troponin I (High Sensitivity): 133 ng/L (ref <18)
Troponin I (High Sensitivity): 103 ng/L (ref <18)

## 2022-05-25 LAB — TSH: TSH: 0.716 u[IU]/mL (ref 0.350–4.500)

## 2022-05-25 LAB — HEMOGLOBIN A1C
Hgb A1c MFr Bld: 6.4 % — ABNORMAL HIGH (ref 4.8–5.6)
Mean Plasma Glucose: 136.98 mg/dL

## 2022-05-25 LAB — MAGNESIUM: Magnesium: 2 mg/dL (ref 1.7–2.4)

## 2022-05-25 LAB — CBG MONITORING, ED: Glucose-Capillary: 119 mg/dL — ABNORMAL HIGH (ref 70–99)

## 2022-05-25 MED ORDER — ACETAMINOPHEN 325 MG PO TABS
650.0000 mg | ORAL_TABLET | ORAL | Status: DC | PRN
  Administered 2022-05-27 – 2022-05-28 (×3): 650 mg via ORAL
  Filled 2022-05-25 (×3): qty 2

## 2022-05-25 MED ORDER — MELATONIN 5 MG PO TABS
5.0000 mg | ORAL_TABLET | Freq: Every day | ORAL | Status: DC
  Administered 2022-05-25 – 2022-05-28 (×4): 5 mg via ORAL
  Filled 2022-05-25 (×5): qty 1

## 2022-05-25 MED ORDER — CALCIUM CARBONATE 1250 (500 CA) MG PO TABS
1.0000 | ORAL_TABLET | Freq: Two times a day (BID) | ORAL | Status: DC
  Administered 2022-05-26 – 2022-05-29 (×3): 1250 mg via ORAL
  Filled 2022-05-25 (×4): qty 1

## 2022-05-25 MED ORDER — LORATADINE 10 MG PO TABS: 10.0000 mg | ORAL_TABLET | Freq: Every day | ORAL | Status: DC

## 2022-05-25 MED ORDER — ASPIRIN 81 MG PO CHEW
81.0000 mg | CHEWABLE_TABLET | Freq: Every evening | ORAL | Status: DC
  Administered 2022-05-26: 81 mg via ORAL
  Filled 2022-05-25 (×2): qty 1

## 2022-05-25 MED ORDER — INSULIN ASPART 100 UNIT/ML IJ SOLN
0.0000 [IU] | Freq: Three times a day (TID) | INTRAMUSCULAR | Status: DC
  Administered 2022-05-28: 2 [IU] via SUBCUTANEOUS

## 2022-05-25 MED ORDER — METOPROLOL SUCCINATE ER 100 MG PO TB24
100.0000 mg | ORAL_TABLET | Freq: Every day | ORAL | Status: DC
  Administered 2022-05-26 – 2022-05-28 (×3): 100 mg via ORAL
  Filled 2022-05-25 (×4): qty 1

## 2022-05-25 MED ORDER — LOSARTAN POTASSIUM 50 MG PO TABS
100.0000 mg | ORAL_TABLET | Freq: Every day | ORAL | Status: DC
  Administered 2022-05-25 – 2022-05-28 (×3): 100 mg via ORAL
  Filled 2022-05-25 (×5): qty 2

## 2022-05-25 MED ORDER — NITROGLYCERIN 0.4 MG SL SUBL
0.4000 mg | SUBLINGUAL_TABLET | SUBLINGUAL | Status: DC | PRN
  Administered 2022-05-26 – 2022-05-28 (×3): 0.4 mg via SUBLINGUAL
  Filled 2022-05-25 (×3): qty 1

## 2022-05-25 MED ORDER — ATORVASTATIN CALCIUM 40 MG PO TABS
40.0000 mg | ORAL_TABLET | Freq: Every day | ORAL | Status: DC
  Administered 2022-05-25 – 2022-05-26 (×2): 40 mg via ORAL
  Filled 2022-05-25 (×4): qty 1

## 2022-05-25 MED ORDER — VITAMIN B-12 1000 MCG PO TABS: 1000.0000 ug | ORAL_TABLET | Freq: Every day | ORAL | Status: DC

## 2022-05-25 MED ORDER — FLUTICASONE PROPIONATE 50 MCG/ACT NA SUSP
2.0000 | Freq: Every day | NASAL | Status: DC | PRN
  Filled 2022-05-25: qty 16

## 2022-05-25 MED ORDER — METOPROLOL SUCCINATE ER 25 MG PO TB24: 100.0000 mg | ORAL_TABLET | Freq: Every day | ORAL | Status: DC

## 2022-05-25 MED ORDER — CALCIUM CARBONATE 1250 (500 CA) MG PO TABS
600.0000 mg | ORAL_TABLET | Freq: Two times a day (BID) | ORAL | Status: DC
  Filled 2022-05-25 (×2): qty 0.5

## 2022-05-25 MED ORDER — POTASSIUM CHLORIDE CRYS ER 10 MEQ PO TBCR
10.0000 meq | EXTENDED_RELEASE_TABLET | Freq: Every day | ORAL | Status: DC
  Administered 2022-05-26 – 2022-05-29 (×4): 10 meq via ORAL
  Filled 2022-05-25 (×6): qty 1

## 2022-05-25 MED ORDER — ADULT MULTIVITAMIN W/MINERALS CH
1.0000 | ORAL_TABLET | Freq: Every day | ORAL | Status: DC
  Administered 2022-05-26 – 2022-05-29 (×3): 1 via ORAL
  Filled 2022-05-25 (×3): qty 1

## 2022-05-25 MED ORDER — VITAMIN D 25 MCG (1000 UNIT) PO TABS
1000.0000 [IU] | ORAL_TABLET | Freq: Every day | ORAL | Status: DC
  Administered 2022-05-25 – 2022-05-29 (×4): 1000 [IU] via ORAL
  Filled 2022-05-25 (×4): qty 1

## 2022-05-25 MED ORDER — CARBIDOPA-LEVODOPA ER 50-200 MG PO TBCR
1.0000 | EXTENDED_RELEASE_TABLET | Freq: Every day | ORAL | Status: DC
  Administered 2022-05-25 – 2022-05-28 (×4): 1 via ORAL
  Filled 2022-05-25 (×5): qty 1

## 2022-05-25 MED ORDER — VITAMIN C 500 MG PO TABS
1000.0000 mg | ORAL_TABLET | Freq: Every day | ORAL | Status: DC
  Administered 2022-05-26 – 2022-05-29 (×3): 1000 mg via ORAL
  Filled 2022-05-25 (×4): qty 2

## 2022-05-25 MED ORDER — ONDANSETRON HCL 4 MG/2ML IJ SOLN: 4.0000 mg | Freq: Four times a day (QID) | INTRAMUSCULAR | Status: DC | PRN

## 2022-05-25 MED ORDER — LORATADINE 10 MG PO TABS
10.0000 mg | ORAL_TABLET | Freq: Every day | ORAL | Status: DC
  Administered 2022-05-26 – 2022-05-29 (×3): 10 mg via ORAL
  Filled 2022-05-25 (×3): qty 1

## 2022-05-25 MED ORDER — FUROSEMIDE 20 MG PO TABS: 40.0000 mg | ORAL_TABLET | Freq: Every day | ORAL | Status: DC

## 2022-05-25 MED ORDER — FUROSEMIDE 40 MG PO TABS
40.0000 mg | ORAL_TABLET | Freq: Every day | ORAL | Status: DC
  Administered 2022-05-26: 40 mg via ORAL
  Filled 2022-05-25: qty 2
  Filled 2022-05-25: qty 1

## 2022-05-25 MED ORDER — ALBUTEROL SULFATE HFA 108 (90 BASE) MCG/ACT IN AERS: 2.0000 | INHALATION_SPRAY | Freq: Four times a day (QID) | RESPIRATORY_TRACT | Status: DC | PRN

## 2022-05-25 MED ORDER — VITAMIN C 500 MG PO TABS: 1000.0000 mg | ORAL_TABLET | Freq: Every day | ORAL | Status: DC

## 2022-05-25 MED ORDER — ASPIRIN 81 MG PO CHEW
324.0000 mg | CHEWABLE_TABLET | Freq: Once | ORAL | Status: AC
  Administered 2022-05-25: 324 mg via ORAL
  Filled 2022-05-25: qty 4

## 2022-05-25 MED ORDER — CARBIDOPA-LEVODOPA 25-100 MG PO TABS
1.0000 | ORAL_TABLET | Freq: Four times a day (QID) | ORAL | Status: DC
  Administered 2022-05-25 – 2022-05-29 (×13): 1 via ORAL
  Filled 2022-05-25 (×19): qty 1

## 2022-05-25 MED ORDER — VITAMIN B-12 1000 MCG PO TABS
1000.0000 ug | ORAL_TABLET | Freq: Every day | ORAL | Status: DC
  Administered 2022-05-26 – 2022-05-29 (×3): 1000 ug via ORAL
  Filled 2022-05-25 (×3): qty 1

## 2022-05-25 NOTE — ED Triage Notes (Signed)
Pt complains of cp/sweating that started yesterday. [name] pain went away yesterday. Pain came back this morning in his chest and into left arm and pt took 1 nitro, pain resolved.

## 2022-05-25 NOTE — H&P (Addendum)
Cardiology Admission History and Physical   Patient ID: [name] MRN: [id number]; DOB: October 21, 1945   Admission date: 05/25/2022  PCP:  [name], NP   [facility] Cardiologist:  Candee Furbish, MD  Electrophysiologist:  Cristopher Peru, MD  { Chief Complaint:  Chest pain   Patient Profile:   [name] is a 76 y.o. male with  hx of  CAD (s/p CABG x 2 w/ LIMA-LAD and SVG-Cx in 2005), aortic stenosis (s/p Bentall aortic root replacement w/ St.Jude mechanical valve), CHB (s/p PPM 2005), Type 2 DM, HTN, HLD, Parkinson disease and GERD   who is being seen 05/25/2022 for the evaluation of Chest pain.  Stress test 01/2013: low risk  Echo 01/2016: LVEF 55% Echo 04/2020: LVEF 50-55% and grade 1 DD Echo  04/2021: LVEF 45-50%, grade 1 DD, stable mechanical aortic valve  CT angio of chest aorta 05/03/2022: Unchanged fusiform aneurysmal dilation of the distal ascending thoracic aorta measuring up to 4.5 cm.  Patient was doing relatively well on cardiac standpoint when seen by me 05/10/2022.    History of Present Illness:   [name] usual state of health up until yesterday morning when he had upper sternal sharp chest pain with severe diaphoresis after waking up.  Blood pressure was 170/102.  No associated shortness of breath or radiation.  Symptoms spontaneously resolved within 90 minutes.  He had a recurrent episode this morning after waking up.  This time he took sublingual nitroglycerin with instant resolution of pain.  He came to ER for further evaluation.  He reports yesterday's episode was more intense and severe.  Patient reports typically he gets chest discomfort at upper chest while he goes outside in "cold".  However this does not occur during hot weather.  No regular exercise but does some yard work and walking with dog.  Limited activity due to chronic arthritic issue and Parkinson disease.  Sleeps in recliner chronically.  Hs-troponin 143>>156 INR  2.1 Scr 0.84 Chest x-ray with no active disease   Past Medical History:  Diagnosis Date   Acute renal failure ([place]) 02/09/2013   Aortic stenosis 08/25/2010   a. Bentall aortic root replacement with a St. Jude mechanical valve and Hemashield conduit 02/2004.   Arthritis    Asthma    AV BLOCK, COMPLETE    a. s/p St Jude dual chamber pacemaker 02/2004.   CAD (coronary artery disease)    a. s/p CABGx2 (LIMA-dLAD, SVG-Cx). b. Low risk nuc 12/2012 without ischemia, EF 46% mild apical hypokinesia (EF 55% inf HK by echo).   Carotid artery disease ([facility])    a. 0-39% bilateral ICA stenosis, stable mild hard plaque in carotid bulbs. F/u 03/2014 recommended.   Diabetes mellitus without complication ([place])    type 2    DIVERTICULOSIS, COLON 10/17/2007   Ejection fraction    a. EF 55% with inf HK, mild MR by echo 12/2012.   GERD (gastroesophageal reflux disease)    hxof    GOUT 01/16/2007   Headache    hx of migraines    HEMORRHOIDS, INTERNAL 11/01/2008   Hx of adenomatous polyp of colon 02/2019   no recall needed   HYPERLIPIDEMIA 01/16/2007   HYPERTENSION 01/16/2007   Impaired glucose tolerance    LEG EDEMA 11/27/2008   Special screening for malignant neoplasm of prostate    Warfarin anticoagulation    AVR    Past Surgical History:  Procedure Laterality Date   AORTIC VALVE REPLACEMENT  CARDIAC CATHETERIZATION  06/2003   CIRCUMCISION     COLONOSCOPY     CORONARY ARTERY BYPASS GRAFT     coronary stents      prior to bypass    EP IMPLANTABLE DEVICE N/A 05/06/2016   Procedure: PPM Generator Changeout;  Surgeon: Evans Lance, MD;  Location: [facility];  Service: Cardiovascular;  Laterality: N/A;   ESOPHAGOGASTRODUODENOSCOPY     HERNIA REPAIR     INGUINAL HERNIA REPAIR Bilateral 02/07/2013   Procedure: LAPAROSCOPIC BILATERAL INGUINAL HERNIA REPAIR;  Surgeon: Imogene Burn. Georgette Dover, MD;  Location: [facility];  Service: General;  Laterality: Bilateral;   INSERTION OF MESH Bilateral 02/07/2013    Procedure: INSERTION OF MESH;  Surgeon: Imogene Burn. Georgette Dover, MD;  Location: [facility];  Service: General;  Laterality: Bilateral;   LUMBAR LAMINECTOMY/DECOMPRESSION MICRODISCECTOMY N/A 03/15/2018   Procedure: Complete decompressive lumbar laminectomy for spinal stenosis of L4-L5 and foraminotomy for L4-L5 root on right;  Surgeon: [name], MD;  Location: [facility];  Service: Orthopedics;  Laterality: N/A;   PACEMAKER INSERTION     SHOULDER SURGERY Bilateral    SKIN BIOPSY  05/04/2019   BASAL CELL CARCINOMA//MID CENTRAL NASAL   SKIN BIOPSY  10/11/2019   Superficial basal cell carcinoma - left superior central forehead shave     Medications Prior to Admission: Prior to Admission medications   Medication Sig Start Date End Date Taking? Authorizing Provider  acetaminophen (TYLENOL) 650 MG CR tablet Take 1,300 mg by mouth 2 (two) times daily.   Yes [provider]  albuterol (VENTOLIN HFA) 108 (90 Base) MCG/ACT inhaler INHALE 2 PUFFS INTO THE LUNGS EVERY 6 HOURS AS NEEDED FOR WHEEZING OR SHORTNESS OF BREATH. Patient taking differently: Inhale 1-2 puffs into the lungs every 6 (six) hours as needed for wheezing or shortness of breath. 10/06/21  Yes Nafziger, Tommi Rumps, NP  Ascorbic Acid (VITAMIN C) 1000 MG tablet Take 1,000 mg by mouth daily.   Yes [provider]  aspirin 81 MG tablet Take 81 mg by mouth every evening.    Yes [provider]  atorvastatin (LIPITOR) 40 MG tablet TAKE 1 TABLET EVERY DAY AT 6 PM. Patient taking differently: Take 40 mg by mouth daily. 01/07/22  Yes Evans Lance, MD  calcium carbonate (OS-CAL) 600 MG TABS Take 600 mg by mouth 2 (two) times daily with a meal.   Yes [provider]  carbidopa-levodopa (SINEMET CR) 50-200 MG tablet Take 1 tablet by mouth at bedtime.   Yes [provider]  carbidopa-levodopa (SINEMET IR) 25-100 MG tablet Take 1 tablet by mouth 4 (four) times daily.   Yes [provider]  cetirizine (ZYRTEC) 10  MG tablet Take 10 mg by mouth daily.    Yes [provider]  cholecalciferol (VITAMIN D) 25 MCG (1000 UNIT) tablet Take 1,000 Units by mouth daily.   Yes [provider]  fluticasone (FLONASE) 50 MCG/ACT nasal spray Place 2 sprays into both nostrils daily. Patient taking differently: Place 2 sprays into both nostrils daily as needed for allergies or rhinitis. 12/28/18  Yes Nafziger, Tommi Rumps, NP  furosemide (LASIX) 40 MG tablet TAKE 1 TABLET EVERY DAY Patient taking differently: Take 40 mg by mouth daily. 08/31/21  Yes Nafziger, Tommi Rumps, NP  losartan (COZAAR) 100 MG tablet TAKE 1 TABLET EVERY DAY (PLEASE MAKE OVERDUE APPT WITH DR. Lovena Le BEFORE ANY MORE REFILLS, 2ND ATTEMPT, CALL [phone]) Patient taking differently: Take 100 mg by mouth daily. 01/07/22  Yes Evans Lance,  MD  melatonin 5 MG TABS Take 5 mg by mouth at bedtime.   Yes [provider]  metFORMIN (GLUCOPHAGE) 500 MG tablet TAKE 1/2 TABLET EVERY DAY WITH BREAKFAST - SCHEDULE APPT FOR FUTURE REFILLS Patient taking differently: Take 250 mg by mouth daily. 04/14/22  Yes Nafziger, Tommi Rumps, NP  Misc Natural Products (TART CHERRY ADVANCED) CAPS Take 2 capsules by mouth daily.    Yes [provider]  Multiple Vitamin (MULTIVITAMIN) tablet Take 1 tablet by mouth daily.     Yes [provider]  nitroGLYCERIN (NITROSTAT) 0.4 MG SL tablet Place 1 tablet (0.4 mg total) under the tongue every 5 (five) minutes as needed. For chest pain 08/28/21  Yes Jerline Pain, MD  potassium chloride (KLOR-CON) 10 MEQ tablet TAKE 1 TABLET EVERY DAY Patient taking differently: Take 10 mEq by mouth once. 08/31/21  Yes Nafziger, Tommi Rumps, NP  Probiotic Product (Elfrida PO) Take 1 capsule by mouth daily.   Yes [provider]  vitamin B-12 (CYANOCOBALAMIN) 1000 MCG tablet Take 1,000 mcg by mouth daily.   Yes [provider]  warfarin (COUMADIN) 10 MG tablet TAKE 1/2 TO 1 TABLET DAILY OR AS DIRECTED BY  COUMADIN CLINIC Patient taking differently: Take 5-10 mg by mouth at bedtime. Take 5 mg (1/2 tablet) by mouth on Sunday's, Tuesday's, and Thursday's. On the other days of the week take 10 mg (1 tablet) 09/22/21  Yes Jerline Pain, MD  Accu-Chek FastClix Lancets MISC USE TO CHECK BLOOD SUGAR TWICE A DAY AND AS NEEDED 06/18/21   Nafziger, Tommi Rumps, NP  Blood Glucose Monitoring Suppl (ACCU-CHEK GUIDE) w/Device KIT Use as directed 06/18/21   [name], NP  glucose blood (ACCU-CHEK SMARTVIEW) test strip Use as instructed 06/18/21   [name], NP  metoprolol succinate (TOPROL-XL) 100 MG 24 hr tablet TAKE 1 TABLET EVERY DAY Patient taking differently: Take 100 mg by mouth daily. 09/23/21   [name], NP     Allergies:    Allergies  Allergen Reactions   Bee Venom Shortness Of Breath and Swelling   Codeine Phosphate Hives, Shortness Of Breath, Itching, Rash and Other (See Comments)    Tolerated multiple doses of IV morphine   Metoprolol Tartrate Other (See Comments)    loss of vision. Can take the XL tablet not regular    Pramipexole Other (See Comments)    Hallucinations     Tramadol     Pt has hallucinations    Ramipril Rash and Cough    Social History:   Social History   Socioeconomic History   Marital status: Married    Spouse name: Not on file   Number of children: 3   Years of education: Not on file   Highest education level: Not on file  Occupational History   Occupation: retired  Tobacco Use   Smoking status: Former    Types: Cigarettes    Quit date: 07/12/1973    Years since quitting: 48.9   Smokeless tobacco: Never  Vaping Use   Vaping Use: Never used  Substance and Sexual Activity   Alcohol use: No   Drug use: No   Sexual activity: Not on file  Other Topics Concern   Not on file  Social History Narrative   The patient is married and retired   He reports 3 grown children   Former smoker, no alcohol or tobacco or drug use at this time   Social Determinants  of Radio broadcast assistant Strain:  Low Risk  (06/16/2021)   Overall Financial Resource Strain (CARDIA)    Difficulty of Paying Living Expenses: Not hard at all  Food Insecurity: No Food Insecurity (06/16/2021)   Hunger Vital Sign    Worried About Running Out of Food in the Last Year: Never true    Ran Out of Food in the Last Year: Never true  Transportation Needs: No Transportation Needs (04/26/2022)   PRAPARE - Hydrologist (Medical): No    Lack of Transportation (Non-Medical): No  Physical Activity: Unknown (09/15/2021)   Exercise Vital Sign    Days of Exercise per Week: Patient refused    Minutes of Exercise per Session: 60 min  Stress: No Stress Concern Present (09/15/2021)   [place]    Feeling of Stress : Not at all  Social Connections: [place] (09/15/2021)   Social Connection and Isolation Panel [NHANES]    Frequency of Communication with Friends and Family: More than three times a week    Frequency of Social Gatherings with Friends and Family: More than three times a week    Attends Religious Services: More than 4 times per year    Active Member of [employer] or Organizations: No    Attends Music therapist: More than 4 times per year    Marital Status: Married  Human resources officer Violence: Not At Risk (06/16/2021)   Humiliation, Afraid, Rape, and Kick questionnaire    Fear of Current or Ex-Partner: No    Emotionally Abused: No    Physically Abused: No    Sexually Abused: No    Family History:   The patient's family history includes Diabetes in his brother, brother, mother, and sister; Epilepsy in his sister; Heart attack in his father; Hypertension in his mother; Kidney cancer in his father. There is no history of Colon cancer, Rectal cancer, Stomach cancer, or Esophageal cancer.    ROS:  Please see the history of present illness.  All other ROS reviewed and  negative.     Physical Exam/Data:   Vitals:   05/25/22 1445 05/25/22 1523 05/25/22 1547 05/25/22 1615  BP: (!) 159/84 (!) 154/79 (!) 145/104 (!) 140/96  Pulse: 68 69 72 71  Resp: _0 Temp:   98 F (36.7 C)   TempSrc:      SpO2: 96% 98% 99% 97%   No intake or output data in the 24 hours ending 05/25/22 1645    05/10/2022    3:46 PM 11/04/2021    9:01 AM 09/15/2021    1:32 PM  Last 3 Weights  Weight (lbs) 207 lb 203 lb 203 lb  Weight (kg) 93.895 kg 92.08 kg 92.08 kg     There is no height or weight on file to calculate BMI.  General:  Well nourished, well developed, in no acute distress HEENT: normal Neck: no JVD Vascular: No carotid bruits; Distal pulses 2+ bilaterally   Cardiac:  normal S1, S2; RRR; Scrisp mechanical valve  Lungs:  clear to auscultation bilaterally, no wheezing, rhonchi or rales  Abd: soft, nontender, no hepatomegaly  Ext: Trace to 1 +  edema R> L Musculoskeletal:  No deformities, BUE and BLE strength normal and equal Skin: warm and dry  Neuro:  CNs 2-12 intact, no focal abnormalities noted Psych:  Normal affect    EKG:  The ECG that was done today  was personally reviewed and demonstrates AV sense rhythm  Relevant CV Studies:  Echo 04/13/2021 1. Distal septal hypokinesis Inferior wall hypokinesis . Left ventricular  ejection fraction, by estimation, is 45 to 50%. The left ventricle has  mildly decreased function. The left ventricle demonstrates regional wall  motion abnormalities (see scoring  diagram/findings for description). The left ventricular internal cavity  size was mildly dilated. There is mild left ventricular hypertrophy. Left  ventricular diastolic parameters are consistent with Grade I diastolic  dysfunction (impaired relaxation).   2. Pacing wires in RA/RV. Right ventricular systolic function is normal.  The right ventricular size is normal. There is normal pulmonary artery  systolic pressure.   3. The mitral valve is  abnormal. Mild mitral valve regurgitation. No  evidence of mitral stenosis.   4. Appears to be both central and PVL Post mechanical AVR replacement ST  Jude with Bental stable low gradients in systole . The aortic valve has  been repaired/replaced. Aortic valve regurgitation is mild. No aortic  stenosis is present. Procedure Date:  02/2004.   5. The inferior vena cava is normal in size with greater than 50%  respiratory variability, suggesting right atrial pressure of 3 mmHg.    Laboratory Data:  High Sensitivity Troponin:   Recent Labs  Lab 05/25/22 1214 05/25/22 1454  TROPONINIHS 143* 156*      Chemistry Recent Labs  Lab 05/25/22 1214  NA 141  K 4.0  CL 106  CO2 26  GLUCOSE 131*  BUN 14  CREATININE 0.84  CALCIUM 9.2  GFRNONAA >60  ANIONGAP 9    Recent Labs  Lab 05/25/22 1214  PROT 6.0*  ALBUMIN 3.8  AST 37  ALT 10  ALKPHOS 47  BILITOT 0.8   Lipids No results for input(s): "CHOL", "TRIG", "HDL", "LABVLDL", "LDLCALC", "CHOLHDL" in the last 168 hours. Hematology Recent Labs  Lab 05/25/22 1214  WBC 7.8  RBC 4.85  HGB 14.2  HCT 44.6  MCV 92.0  MCH 29.3  MCHC 31.8  RDW 14.8  PLT 228   Radiology/Studies:  DG Chest 1 View  Result Date: 05/25/2022 CLINICAL DATA:  Chest pain beginning today EXAM: CHEST  1 VIEW COMPARISON:  06/07/2016 FINDINGS: Previous median sternotomy, CABG and mitral valve replacement. Heart size remains normal. Tortuous aorta as seen previously. Dual lead pacemaker in place. The pulmonary vascularity is normal. The lungs are clear. No acute bone finding. IMPRESSION: No active disease. Previous CABG and mitral valve replacement. Dual lead pacemaker. Electronically Signed   By: Nelson Chimes M.D.   On: 05/25/2022 12:44     Assessment and Plan:   NSTEMI First episode yesterday after waking up.  Resolved within 90 minutes.  Similar episode today resolved after sublingual nitroglycerin.  Worst episode was yesterday.  His symptoms concerning  for unstable angina.  However, blood pressure was elevated yesterday @ 170/102 but normal today.  Elevated high sensitive troponin at 143>>156. Cycle troponin.  Update echocardiogram - Continue home ASA, statin and BB - Plan cath tomorrow or Thursday when INR less than 1.8  2. Aortic stenosis s/p Bentall aortic root replacement w/ St.Jude mechanical valve in 2005 - CT angio of chest aorta 05/03/2022: Unchanged fusiform aneurysmal dilation of the distal ascending thoracic aorta measuring up to 4.5 cm. - On coumadin for anticoagulation. INR 2.1. Hold Warfarin. Start heparin.  - Update echocardiogram   3. HTN -Blood pressure control today    4. HLD -Continue statin.  5. HFmrEF - Echo 04/2020: LVEF 50-55% and grade 1 DD - Echo  04/2021: LVEF  45-50%, grade 1 DD, stable mechanical aortic valve  - Continue Losartan and lasix  - Continue BB - Update echo   6. DM - SSI   Risk Assessment/Risk Scores:    TIMI Risk Score for Unstable Angina or Non-ST Elevation MI:   The patient's TIMI risk score is 6, which indicates a 41% risk of all cause mortality, new or recurrent myocardial infarction or need for urgent revascularization in the next 14 days.     Severity of Illness: The appropriate patient status for this patient is INPATIENT. Inpatient status is judged to be reasonable and necessary in order to provide the required intensity of service to ensure the patient's safety. The patient's presenting symptoms, physical exam findings, and initial radiographic and laboratory data in the context of their chronic comorbidities is felt to place them at high risk for further clinical deterioration. Furthermore, it is not anticipated that the patient will be medically stable for discharge from the hospital within 2 midnights of admission.   * I certify that at the point of admission it is my clinical judgment that the patient will require inpatient hospital care spanning beyond 2 midnights from the  point of admission due to high intensity of service, high risk for further deterioration and high frequency of surveillance required.*   For questions or updates, please contact [place] Please consult [email or web address] for contact info under     Signed, Leanor Kail, PA  05/25/2022 4:45 PM    ATTENDING ATTESTATION:  After conducting a review of all available clinical information with the care team, interviewing the patient, and performing a physical exam, I agree with the findings and plan described in this note.   GEN: No acute distress.   HEENT:  MMM, no JVD, no scleral icterus Cardiac: RRR, mechanical S2, no murmurs, rubs, or gallops.  Respiratory: Clear to auscultation bilaterally. GI: Soft, nontender, non-distended  MS: No edema; No deformity. Neuro:  Nonfocal; tremor of his right upper extremity and less so of his left upper extremity Vasc: +1 radial pulses bilaterally; palpable femoral pulses  The patient is a 76 year old male with a history of remote CABG consisting of a LIMA to LAD and vein graft to circumflex as well as mechanical aortic valve replacement with Bentall aortic root replacement, complete heart block status post permanent pacemaker, type 2 diabetes, hypertension, hyperlipidemia, and Parkinson's disease was evaluated for unstable angina.  The patient was in his normal state of health up until a few weeks ago.  He tells me he normally walks his dog outside without any issues.  He has noticed increasing exertional angina.  This is happened a few times now but earlier today it happened at rest.  His EKG demonstrates V pacing.  His troponins are elevated.  He currently is chest pain-free.  I think the best approach in this relatively functional 76 year old is coronary angiography for restratification and to relieve symptoms.  He is on Coumadin for mechanical aortic valve replacement.  We will place him on a heparin drip and wait for an INR of around 1.8.  He  does have a left radial pulse however he does have a tremor in this arm though it is less exuberant than in his right arm.  It may be that a femoral approach may need to be pursued.  I have reviewed the risks, indications, and alternatives to cardiac catheterization, possible angioplasty, and stenting with the patient. Risks include but are not limited to bleeding, infection, vascular injury, stroke, myocardial  infection, arrhythmia, kidney injury, radiation-related injury in the case of prolonged fluoroscopy use, emergency cardiac surgery, and death. The patient understands the risks of serious complication is 1-2 in 7510 with diagnostic cardiac cath and 1-2% or less with angioplasty/stenting.    Lenna Sciara, MD Pager [phone]

## 2022-05-25 NOTE — ED Notes (Signed)
Pacemaker interrogated. 

## 2022-05-25 NOTE — ED Provider Triage Note (Addendum)
Emergency Medicine Provider Triage Evaluation Note  [name] , a 76 y.o. male  was evaluated in triage.  Pt complains of chest pain starting at 9AM on 11/13 w/associated diaphoresis, that resolved after 1 hr, resolved on own. Then started having chest pain this AM, and was given nitroglycerin with relief of symptoms. No current chest pain, sob, nausea, or vomiting.   Review of Systems  Positive: Chest pain, diaphoresis Negative: SOB  Physical Exam  BP 130/71 (BP Location: Right Arm)   Pulse 71   Temp 98.5 F (36.9 C) (Oral)   Resp 16   SpO2 97%  Gen:   Awake, no distress   Resp:  Normal effort  MSK:   Moves extremities without difficulty   Medical Decision Making  Medically screening exam initiated at 12:07 PM.  Appropriate orders placed.  [name] was informed that the remainder of the evaluation will be completed by another provider, this initial triage assessment does not replace that evaluation, and the importance of remaining in the ED until their evaluation is complete.   Osvaldo Shipper, [place] 05/25/22 1210  Concern for possible NSTEMI--messaged charge RN, troponin 143 to get patient back ASAP.    Osvaldo Shipper, [place] 05/25/22 1349

## 2022-05-25 NOTE — ED Provider Notes (Addendum)
[facility] EMERGENCY DEPARTMENT Provider Note   CSN: [id number] Arrival date & time: 05/25/22  1128     History  Chief Complaint  Patient presents with   Chest Pain    [name] is a 76 y.o. male.   Chest Pain Patient presents with chest pain.  Anterior chest.  Dull.  Had episode yesterday and again today.  Has not really had pains like this before.  Does have previous coronary artery disease post CABG.  Also previous AV block with pacemaker.  Also mechanical aortic valve for aortic stenosis.  Developed pain.  Came on while he was sitting.  States he really does not do much activity.  States he has had episodes where he gets it when he goes outside with thought it was due to the cold weather.  States he took nitroglycerin today and it went away.  Has never had to take nitroglycerin prior to this.  Pain-free now.    Past Medical History:  Diagnosis Date   Acute renal failure ([place]) 02/09/2013   Aortic stenosis 08/25/2010   a. Bentall aortic root replacement with a St. Jude mechanical valve and Hemashield conduit 02/2004.   Arthritis    Asthma    AV BLOCK, COMPLETE    a. s/p St Jude dual chamber pacemaker 02/2004.   CAD (coronary artery disease)    a. s/p CABGx2 (LIMA-dLAD, SVG-Cx). b. Low risk nuc 12/2012 without ischemia, EF 46% mild apical hypokinesia (EF 55% inf HK by echo).   Carotid artery disease (HCC)    a. 0-39% bilateral ICA stenosis, stable mild hard plaque in carotid bulbs. F/u 03/2014 recommended.   Diabetes mellitus without complication ([place])    type 2    DIVERTICULOSIS, COLON 10/17/2007   Ejection fraction    a. EF 55% with inf HK, mild MR by echo 12/2012.   GERD (gastroesophageal reflux disease)    hxof    GOUT 01/16/2007   Headache    hx of migraines    HEMORRHOIDS, INTERNAL 11/01/2008   Hx of adenomatous polyp of colon 02/2019   no recall needed   HYPERLIPIDEMIA 01/16/2007   HYPERTENSION 01/16/2007   Impaired glucose tolerance    LEG EDEMA  11/27/2008   Special screening for malignant neoplasm of prostate    Warfarin anticoagulation    AVR    Home Medications Prior to Admission medications   Medication Sig Start Date End Date Taking? Authorizing Provider  Accu-Chek FastClix Lancets MISC USE TO CHECK BLOOD SUGAR TWICE A DAY AND AS NEEDED 06/18/21   Nafziger, Tommi Rumps, NP  acetaminophen (TYLENOL) 650 MG CR tablet Take 1,300 mg by mouth 2 (two) times daily.    [provider]  albuterol (VENTOLIN HFA) 108 (90 Base) MCG/ACT inhaler INHALE 2 PUFFS INTO THE LUNGS EVERY 6 HOURS AS NEEDED FOR WHEEZING OR SHORTNESS OF BREATH. 10/06/21   Nafziger, Tommi Rumps, NP  Ascorbic Acid (VITAMIN C) 1000 MG tablet Take 1,000 mg by mouth daily.    [provider]  aspirin 81 MG tablet Take 81 mg by mouth every evening.     [provider]  atorvastatin (LIPITOR) 40 MG tablet TAKE 1 TABLET EVERY DAY AT 6 PM. 01/07/22   Evans Lance, MD  Blood Glucose Monitoring Suppl (ACCU-CHEK GUIDE) w/Device KIT Use as directed 06/18/21   [name], NP  calcium carbonate (OS-CAL) 600 MG TABS Take 600 mg by mouth 2 (two) times daily with a meal.    [provider]  carbidopa-levodopa (SINEMET CR) 50-200 MG tablet Take 1 tablet by mouth at bedtime.    [provider]  carbidopa-levodopa (SINEMET IR) 25-100 MG tablet Take 1 tablet by mouth 3 (three) times daily.     [provider]  cetirizine (ZYRTEC) 10 MG tablet Take 10 mg by mouth daily.     [provider]  cholecalciferol (VITAMIN D) 25 MCG (1000 UNIT) tablet Take 1 tablet by mouth daily.    [provider]  fluticasone (FLONASE) 50 MCG/ACT nasal spray Place 2 sprays into both nostrils daily. 12/28/18   Nafziger, Tommi Rumps, NP  furosemide (LASIX) 40 MG tablet TAKE 1 TABLET EVERY DAY 08/31/21   Nafziger, Tommi Rumps, NP  glucose blood (ACCU-CHEK SMARTVIEW) test strip Use as instructed 06/18/21   Nafziger, Tommi Rumps, NP  guaiFENesin-dextromethorphan (ROBITUSSIN DM)  100-10 MG/5ML syrup Take 5 mLs by mouth every 4 (four) hours as needed for cough.    [provider]  losartan (COZAAR) 100 MG tablet TAKE 1 TABLET EVERY DAY (PLEASE MAKE OVERDUE APPT WITH DR. Lovena Le BEFORE ANY MORE REFILLS, 2ND ATTEMPT, CALL [phone]) 01/07/22   Evans Lance, MD  melatonin 5 MG TABS Take 5 mg by mouth at bedtime.    [provider]  metFORMIN (GLUCOPHAGE) 500 MG tablet TAKE 1/2 TABLET EVERY DAY WITH BREAKFAST - SCHEDULE APPT FOR FUTURE REFILLS 04/14/22   [name], NP  metoprolol succinate (TOPROL-XL) 100 MG 24 hr tablet TAKE 1 TABLET EVERY DAY 09/23/21   Nafziger, Tommi Rumps, NP  Misc Natural Products (TART CHERRY ADVANCED) CAPS Take 2 capsules by mouth daily.     [provider]  Multiple Vitamin (MULTIVITAMIN) tablet Take 1 tablet by mouth daily.      [provider]  nitroGLYCERIN (NITROSTAT) 0.4 MG SL tablet Place 1 tablet (0.4 mg total) under the tongue every 5 (five) minutes as needed. For chest pain 08/28/21   Jerline Pain, MD  potassium chloride (KLOR-CON) 10 MEQ tablet TAKE 1 TABLET EVERY DAY 08/31/21   Nafziger, Tommi Rumps, NP  Probiotic Product (PHILLIPS COLON HEALTH PO) Take 1 capsule by mouth daily.    [provider]  vitamin B-12 (CYANOCOBALAMIN) 1000 MCG tablet Take 1,000 mcg by mouth daily.    [provider]  warfarin (COUMADIN) 10 MG tablet TAKE 1/2 TO 1 TABLET DAILY OR AS DIRECTED BY COUMADIN CLINIC 09/22/21   Jerline Pain, MD      Allergies    Bee venom, Codeine phosphate, Metoprolol tartrate, Pramipexole, Tramadol, and Ramipril    Review of Systems   Review of Systems  Cardiovascular:  Positive for chest pain.    Physical Exam Updated Vital Signs BP (!) 159/84   Pulse 68   Temp 97.9 F (36.6 C)   Resp 16   SpO2 96%  Physical Exam Vitals and nursing note reviewed.  Constitutional:      Appearance: He is well-developed.  Cardiovascular:     Rate and Rhythm: Normal rate and regular rhythm.   Pulmonary:     Breath sounds: No wheezing or rhonchi.  Chest:     Chest wall: No tenderness.     Comments: Scar from previous median sternotomy. Abdominal:     Tenderness: There is no abdominal tenderness.  Musculoskeletal:     Right lower leg: No edema.     Left lower leg: No edema.  Skin:    General: Skin is warm.  Neurological:     Mental Status: He is alert.     ED  Results / Procedures / Treatments   Labs (all labs ordered are listed, but only abnormal results are displayed) Labs Reviewed  COMPREHENSIVE METABOLIC PANEL - Abnormal; Notable for the following components:      Result Value   Glucose, Bld 131 (*)    Total Protein 6.0 (*)    All other components within normal limits  TROPONIN I (HIGH SENSITIVITY) - Abnormal; Notable for the following components:   Troponin I (High Sensitivity) 143 (*)    All other components within normal limits  CBC  PROTIME-INR  CBG MONITORING, ED  TROPONIN I (HIGH SENSITIVITY)    EKG EKG Interpretation  Date/Time:  Tuesday May 25 2022 11:43:44 EST Ventricular Rate:  69 PR Interval:  248 QRS Duration: 188 QT Interval:  478 QTC Calculation: 512 R Axis:   261 Text Interpretation: Atrial-sensed ventricular-paced rhythm with prolonged AV conduction Abnormal ECG When compared with ECG of 27-Jan-2016 04:52, previous AV pacing Confirmed by Davonna Belling [phone]) on 05/25/2022 2:24:47 PM  Radiology DG Chest 1 View  Result Date: 05/25/2022 CLINICAL DATA:  Chest pain beginning today EXAM: CHEST  1 VIEW COMPARISON:  06/07/2016 FINDINGS: Previous median sternotomy, CABG and mitral valve replacement. Heart size remains normal. Tortuous aorta as seen previously. Dual lead pacemaker in place. The pulmonary vascularity is normal. The lungs are clear. No acute bone finding. IMPRESSION: No active disease. Previous CABG and mitral valve replacement. Dual lead pacemaker. Electronically Signed   By: Nelson Chimes M.D.   On: 05/25/2022 12:44     Procedures Procedures    Medications Ordered in ED Medications  aspirin chewable tablet 324 mg (324 mg Oral Given 05/25/22 1213)    ED Course/ Medical Decision Making/ A&P                           Medical Decision Making Amount and/or Complexity of Data Reviewed Labs: ordered.  Risk Decision regarding hospitalization.   Patient with chest pain.  Anterior chest.  Differential diagnoses long but does include coronary artery disease, unstable angina, aortic disease.  Previous aortic root repair.  Recent CT scan reviewed and was stable from prior.  EKG is paced.  Troponin is elevated 140.  Blood pressure reportedly elevated yesterday but more normal now.  With elevated troponin and chest pain with known coronary disease I feel he benefit from [facility].  He is already on Coumadin.  INR pending.  Has received aspirin.  Will discuss with cardiology for admission.  I have reviewed recent cardiology note and independently interpreted the chest x-ray.  CRITICAL CARE Performed by: Davonna Belling Total critical care time: 30 minutes Critical care time was exclusive of separately billable procedures and treating other patients. Critical care was necessary to treat or prevent imminent or life-threatening deterioration. Critical care was time spent personally by me on the following activities: development of treatment plan with patient and/or surrogate as well as nursing, discussions with consultants, evaluation of patient's response to treatment, examination of patient, obtaining history from patient or surrogate, ordering and performing treatments and interventions, ordering and review of laboratory studies, ordering and review of radiographic studies, pulse oximetry and re-evaluation of patient's condition.   Discussed with cardiology.  They will see patient.         Final Clinical Impression(s) / ED Diagnoses Final diagnoses:  NSTEMI (non-ST elevated  myocardial infarction) ([place])    Rx / DC Orders ED Discharge Orders     None  Davonna Belling, MD 05/25/22 1501    Davonna Belling, MD 05/25/22 1529

## 2022-05-25 NOTE — Progress Notes (Signed)
ANTICOAGULATION CONSULT NOTE - Initial Consult  Pharmacy Consult for Heparin Indication: chest pain/ACS  Allergies  Allergen Reactions   Bee Venom Shortness Of Breath and Swelling   Codeine Phosphate Hives, Shortness Of Breath, Itching, Rash and Other (See Comments)    Tolerated multiple doses of IV morphine   Metoprolol Tartrate Other (See Comments)    loss of vision. Can take the XL tablet not regular    Pramipexole Other (See Comments)    Hallucinations     Tramadol     Pt has hallucinations    Ramipril Rash and Cough    Patient Measurements:  Vital Signs: Temp: 98 F (36.7 C) (11/14 1547) Temp Source: Oral (11/14 1157) BP: 140/96 (11/14 1615) Pulse Rate: 71 (11/14 1615)  Labs: Recent Labs    05/25/22 1214 05/25/22 1454 05/25/22 1536  HGB 14.2  --   --   HCT 44.6  --   --   PLT 228  --   --   LABPROT  --   --  23.0*  INR  --   --  2.1*  CREATININE 0.84  --   --   TROPONINIHS 143* 156*  --     CrCl cannot be calculated (Unknown ideal weight.).   Medical History: Past Medical History:  Diagnosis Date   Acute renal failure ([place]) 02/09/2013   Aortic stenosis 08/25/2010   a. Bentall aortic root replacement with a St. Jude mechanical valve and Hemashield conduit 02/2004.   Arthritis    Asthma    AV BLOCK, COMPLETE    a. s/p St Jude dual chamber pacemaker 02/2004.   CAD (coronary artery disease)    a. s/p CABGx2 (LIMA-dLAD, SVG-Cx). b. Low risk nuc 12/2012 without ischemia, EF 46% mild apical hypokinesia (EF 55% inf HK by echo).   Carotid artery disease (HCC)    a. 0-39% bilateral ICA stenosis, stable mild hard plaque in carotid bulbs. F/u 03/2014 recommended.   Diabetes mellitus without complication ([place])    type 2    DIVERTICULOSIS, COLON 10/17/2007   Ejection fraction    a. EF 55% with inf HK, mild MR by echo 12/2012.   GERD (gastroesophageal reflux disease)    hxof    GOUT 01/16/2007   Headache    hx of migraines    HEMORRHOIDS, INTERNAL 11/01/2008   Hx of  adenomatous polyp of colon 02/2019   no recall needed   HYPERLIPIDEMIA 01/16/2007   HYPERTENSION 01/16/2007   Impaired glucose tolerance    LEG EDEMA 11/27/2008   Special screening for malignant neoplasm of prostate    Warfarin anticoagulation    AVR    Medications:  (Not in a hospital admission)  Scheduled:   vitamin C  1,000 mg Oral Daily   [START ON 05/26/2022] aspirin  81 mg Oral QPM   atorvastatin  40 mg Oral Daily   calcium carbonate  600 mg Oral BID WC   carbidopa-levodopa  1 tablet Oral QHS   carbidopa-levodopa  1 tablet Oral QID   cholecalciferol  1,000 Units Oral Daily   cyanocobalamin  1,000 mcg Oral Daily   furosemide  40 mg Oral Daily   losartan  100 mg Oral Daily   melatonin  5 mg Oral QHS   metoprolol succinate  100 mg Oral Daily   multivitamin  1 tablet Oral Daily   Infusions:  PRN: albuterol  Assessment: 67 yom with a history of CAD (s/p CABG), aortic stenosis (s/p Bentall aortic root replacement w/ St.Jude  mechanical valve), CHB (s/p PPM 2005), T2DM, HTN, HLD, Parkinson disease and GERD. Patient is presenting with chest pain. Heparin per pharmacy consult placed for chest pain/ACS.  Patient is on warfarin prior to arrival. Last dose 11/13. Home dose is 5 mg (10 mg x 0.5) every Sun, Tue, Thu; 10 mg (10 mg x 1) all other days. INR goal per last anticoag visit with cardiology is 2-3.  Hgb 14.2; plt 228 PT/INR 23/2.1  Goal of Therapy:  Heparin level 0.3-0.7 units/ml Monitor platelets by anticoagulation protocol: Yes   Plan:  Start heparin once INR < 2 Monitor as indicated via anti-Xa level Continue to monitor H&H and platelets  Lorelei Pont, PharmD, BCPS 05/25/2022 5:13 PM ED Clinical Pharmacist -  [phone]

## 2022-05-26 ENCOUNTER — Inpatient Hospital Stay (HOSPITAL_COMMUNITY): Payer: Medicare HMO

## 2022-05-26 DIAGNOSIS — I214 Non-ST elevation (NSTEMI) myocardial infarction: Secondary | ICD-10-CM | POA: Diagnosis not present

## 2022-05-26 DIAGNOSIS — R079 Chest pain, unspecified: Secondary | ICD-10-CM | POA: Diagnosis not present

## 2022-05-26 LAB — BASIC METABOLIC PANEL
Anion gap: 6 (ref 5–15)
BUN: 17 mg/dL (ref 8–23)
CO2: 30 mmol/L (ref 22–32)
Calcium: 9.1 mg/dL (ref 8.9–10.3)
Chloride: 105 mmol/L (ref 98–111)
Creatinine, Ser: 0.79 mg/dL (ref 0.61–1.24)
GFR, Estimated: >60 mL/min (ref >60)
Glucose, Bld: 122 mg/dL — ABNORMAL HIGH (ref 70–99)
Potassium: 4.5 mmol/L (ref 3.5–5.1)
Sodium: 141 mmol/L (ref 135–145)

## 2022-05-26 LAB — ECHOCARDIOGRAM COMPLETE
AR max vel: 1.04 cm2
AV Area VTI: 1.09 cm2
AV Area mean vel: 1.13 cm2
AV Mean grad: 6 mmHg
AV Peak grad: 12.4 mmHg
Ao pk vel: 1.76 m/s
Area-P 1/2: 2.32 cm2
Height: 66 in
S' Lateral: 3.6 cm
Weight: 3312 oz

## 2022-05-26 LAB — CBC
HCT: 44.2 % (ref 39.0–52.0)
Hemoglobin: 14.6 g/dL (ref 13.0–17.0)
MCH: 29.9 pg (ref 26.0–34.0)
MCHC: 33 g/dL (ref 30.0–36.0)
MCV: 90.6 fL (ref 80.0–100.0)
Platelets: 230 10*3/uL (ref 150–400)
RBC: 4.88 MIL/uL (ref 4.22–5.81)
RDW: 15 % (ref 11.5–15.5)
WBC: 8.3 10*3/uL (ref 4.0–10.5)
nRBC: 0 % (ref 0.0–0.2)

## 2022-05-26 LAB — GLUCOSE, CAPILLARY
Glucose-Capillary: 113 mg/dL — ABNORMAL HIGH (ref 70–99)
Glucose-Capillary: 130 mg/dL — ABNORMAL HIGH (ref 70–99)

## 2022-05-26 LAB — CBG MONITORING, ED
Glucose-Capillary: 120 mg/dL — ABNORMAL HIGH (ref 70–99)
Glucose-Capillary: 94 mg/dL (ref 70–99)

## 2022-05-26 LAB — PROTIME-INR
INR: 1.9 — ABNORMAL HIGH (ref 0.8–1.2)
INR: 2 — ABNORMAL HIGH (ref 0.8–1.2)
Prothrombin Time: 21.7 seconds — ABNORMAL HIGH (ref 11.4–15.2)
Prothrombin Time: 22.1 seconds — ABNORMAL HIGH (ref 11.4–15.2)

## 2022-05-26 LAB — LIPID PANEL
Cholesterol: 149 mg/dL (ref 0–200)
HDL: 48 mg/dL (ref >40)
LDL Cholesterol: 79 mg/dL (ref 0–99)
Total CHOL/HDL Ratio: 3.1 RATIO
Triglycerides: 110 mg/dL (ref <150)
VLDL: 22 mg/dL (ref 0–40)

## 2022-05-26 LAB — HEPARIN LEVEL (UNFRACTIONATED): Heparin Unfractionated: 0.27 IU/mL — ABNORMAL LOW (ref 0.30–0.70)

## 2022-05-26 MED ORDER — HEPARIN (PORCINE) 25000 UT/250ML-% IV SOLN
1400.0000 [IU]/h | INTRAVENOUS | Status: DC
  Administered 2022-05-26: 1250 [IU]/h via INTRAVENOUS
  Administered 2022-05-27: 1400 [IU]/h via INTRAVENOUS
  Filled 2022-05-26 (×2): qty 250

## 2022-05-26 NOTE — Progress Notes (Addendum)
Rounding Note    Patient Name: [name] Date of Encounter: 05/26/2022  [place] Cardiologist: Candee Furbish, MD   Subjective   Pt were comfortably laying in bed on supine. Pt denied repeat episode of since he is in the hospital.  Patient was comfortably communicating during history taking.  Patient suddenly developed chest pain.  Endorsed pain is sharp in nature, nonradiating, no associated nausea, or vomiting.  Reported chest pain 3 out of 10 which abruptly increased to 7 out of 10.  Patient's blood pressure was elevated with a reading of 154/104, heart rate in 70s.  His saturation was 100% on room air.  Sublingual nitroglycerin 0.4 mg x 1 given at 8:09 AM.  EKG was obtained. Chest pain gradually improved, rated 2 out of 10 at 8:16 AM.  Chest pain completely resolved at 8:19 AM.  His blood pressure improved 127/84, SPO2 98% and heart rate 73. Patient was pain-free, hemodynamically stable, no acute visible distress.  Inpatient Medications    Scheduled Meds:  vitamin C  1,000 mg Oral Daily   aspirin  81 mg Oral QPM   atorvastatin  40 mg Oral Daily   calcium carbonate  1 tablet Oral BID WC   carbidopa-levodopa  1 tablet Oral QHS   carbidopa-levodopa  1 tablet Oral QID   cholecalciferol  1,000 Units Oral Daily   cyanocobalamin  1,000 mcg Oral Daily   furosemide  40 mg Oral Daily   insulin aspart  0-15 Units Subcutaneous TID WC   loratadine  10 mg Oral Daily   losartan  100 mg Oral Daily   melatonin  5 mg Oral QHS   metoprolol succinate  100 mg Oral Daily   multivitamin with minerals  1 tablet Oral Daily   potassium chloride  10 mEq Oral Daily   Continuous Infusions:  PRN Meds: acetaminophen, albuterol, fluticasone, nitroGLYCERIN, ondansetron (ZOFRAN) IV   Vital Signs    Vitals:   05/26/22 0809 05/26/22 0818 05/26/22 0824 05/26/22 0858  BP: (!) 152/109 127/84    Pulse: 76 70    Resp: 18 17    Temp:   97.8 F (36.6 C)   TempSrc:   Oral   SpO2: 99%  95%  (!) 73%   No intake or output data in the 24 hours ending 05/26/22 0942    05/10/2022    3:46 PM 11/04/2021    9:01 AM 09/15/2021    1:32 PM  Last 3 Weights  Weight (lbs) 207 lb 203 lb 203 lb  Weight (kg) 93.895 kg 92.08 kg 92.08 kg      Telemetry    AV paced rhythm- Personally Reviewed  ECG    EKG done during active chest pain. EKG demonstrated AV paced rhythm. - Personally Reviewed  Physical Exam   Comfortably lying in bed.  He initially denied chest pain, however abrupt onset of sharp, nonradiating chest pain during history taking.  GEN: No acute distress.   Neck: No JVD Cardiac: RRR, no murmurs, rubs, or gallops. Aortic mechanical valve click appreciated to ausculation to left upper chest wall.  Respiratory: Clear to auscultation bilaterally.No wheeze, rales or rhonchi appreciated. GI: Soft, nontender, non-distended.  No guarding. Ext: No lower extremity edema noted Neuro:  Nonfocal  Psych: Normal affect.  Alert and oriented x3.  Normal mood and affect.  Labs    High Sensitivity Troponin:   Recent Labs  Lab 05/25/22 1214 05/25/22 1454 05/25/22 1713 05/25/22 1928  TROPONINIHS 143* 156* 133*  103*     Chemistry Recent Labs  Lab 05/25/22 1214 05/25/22 1713 05/26/22 0835  NA 141  --  141  K 4.0  --  4.5  CL 106  --  105  CO2 26  --  30  GLUCOSE 131*  --  122*  BUN 14  --  17  CREATININE 0.84  --  0.79  CALCIUM 9.2  --  9.1  MG  --  2.0  --   PROT 6.0*  --   --   ALBUMIN 3.8  --   --   AST 37  --   --   ALT 10  --   --   ALKPHOS 47  --   --   BILITOT 0.8  --   --   GFRNONAA >60  --  >60  ANIONGAP 9  --  6    Lipids No results for input(s): "CHOL", "TRIG", "HDL", "LABVLDL", "LDLCALC", "CHOLHDL" in the last 168 hours.  Hematology Recent Labs  Lab 05/25/22 1214 05/26/22 0835  WBC 7.8 8.3  RBC 4.85 4.88  HGB 14.2 14.6  HCT 44.6 44.2  MCV 92.0 90.6  MCH 29.3 29.9  MCHC 31.8 33.0  RDW 14.8 15.0  PLT 228 230   Thyroid  Recent Labs  Lab  05/25/22 1740  TSH 0.716    BNPNo results for input(s): "BNP", "PROBNP" in the last 168 hours.  DDimer No results for input(s): "DDIMER" in the last 168 hours.   Radiology    Radiology/Studies:  DG Chest 1 View   Result Date: 05/25/2022 CLINICAL DATA:  Chest pain beginning today EXAM: CHEST  1 VIEW COMPARISON:  06/07/2016 FINDINGS: Previous median sternotomy, CABG and mitral valve replacement. Heart size remains normal. Tortuous aorta as seen previously. Dual lead pacemaker in place. The pulmonary vascularity is normal. The lungs are clear. No acute bone finding. IMPRESSION: No active disease. Previous CABG and mitral valve replacement. Dual lead pacemaker. Electronically Signed   By: Nelson Chimes M.D.   On: 05/25/2022 12:44    Cardiac Studies   Echo 04/13/2021 1. Distal septal hypokinesis Inferior wall hypokinesis . Left ventricular  ejection fraction, by estimation, is 45 to 50%. The left ventricle has  mildly decreased function. The left ventricle demonstrates regional wall  motion abnormalities (see scoring  diagram/findings for description). The left ventricular internal cavity  size was mildly dilated. There is mild left ventricular hypertrophy. Left  ventricular diastolic parameters are consistent with Grade I diastolic  dysfunction (impaired relaxation).   2. Pacing wires in RA/RV. Right ventricular systolic function is normal.  The right ventricular size is normal. There is normal pulmonary artery  systolic pressure.   3. The mitral valve is abnormal. Mild mitral valve regurgitation. No  evidence of mitral stenosis.   4. Appears to be both central and PVL Post mechanical AVR replacement ST  Jude with Bental stable low gradients in systole . The aortic valve has  been repaired/replaced. Aortic valve regurgitation is mild. No aortic  stenosis is present. Procedure Date:  02/2004.   5. The inferior vena cava is normal in size with greater than 50%  respiratory variability,  suggesting right atrial pressure of 3 mmHg.     Patient Profile     Naszir Cott Whitney is a 76 y.o. male with  hx of  CAD (s/p CABG x 2 w/ LIMA-LAD and SVG-Cx in 2005), aortic stenosis (s/p Bentall aortic root replacement w/ St.Jude mechanical valve), on coumadin, complete heart block (  s/p PPM 2005), Type 2 DM, HTN, HLD, Parkinson disease and GERD  who is being seen 05/25/2022 for the evaluation of Chest pain.   Stress test 01/2013: low risk  Echo 01/2016: LVEF 55% Echo 04/2020: LVEF 50-55% and grade 1 DD Echo  04/2021: LVEF 45-50%, grade 1 DD, stable mechanical aortic valve   CT angio of chest aorta 05/03/2022: Unchanged fusiform aneurysmal dilation of the distal ascending thoracic aorta measuring up to 4.5 cm.    Assessment & Plan    1) NSTEMI #Chest pain Patient were chest pain-free since yesterday after he came to emergency department.  During my interview this morning patient acutely developed substernal chest pain, sharp in nature, nonradiating, no associated nausea, vomiting or diaphoresis.  Patient rated chest pain 2/10 which abruptly increased to 7 out of 10.  During the chest pain episode patient's blood pressure got elevated from 134/62 to 154/104.  Patient were given sublingual nitroglycerin 0.4x1 which gradually resolved the chest pain.  EKG was obtained during the incident of chest pain.  EKG was personally reviewed which demonstrated AV paced rhythm.  Patient endorsed his chest pain started to improve after 2 to 3 minutes and patient was pain-free within 10 minutes. Patient reported mild headache which also resolved.  Repeat blood pressure was 127/84, SPO2 98% on room air with heart rate of 73.  Presentation concerning with possible stenosis of the coronary arteries limiting coronary perfusion. -Continue aspirin 81 mg once daily -Continue metoprolol succinate 100 mg once daily -Continue atorvastatin 40 mg once daily -INR 1.9, plan for cardiac cath when INR less than 1.8. Repeat INT  at 13:00  2)  Aortic stenosis s/p Bentall aortic root replacement w/ St.Jude mechanical valve in 2005 -On Coumadin, INR 1.9 from 2.1 (Warfarin Held) ,repeat INR at 13:00 -Repeat Echo today  3) HTN:  - BP at goal  -Continue Losartan 100 mg once daily -Continue Metoprolol succinate 100 mg once daily  4) HLD: LDL 63 (03/2021) -Continue Atorvastatin 40 mg once daily -Update lipid panel  5) DM -SSI   6)  HFmrEF - Echo  04/2021: LVEF 45-50%, grade 1 DD, stable mechanical aortic valve  - Continue Losartan 100 mg once daily -Continue Furosemide 40 mg once daily. - Continue Metoprolol Succinate 100 mg once daily - Update echo   For questions or updates, please contact [place] Please consult [email or web address] for contact info under        Signed, Teola Bradley, MD  05/26/2022, 9:42 AM    [id number]  ATTENDING ATTESTATION:  After conducting a review of all available clinical information with the care team, interviewing the patient, and performing a physical exam, I agree with the findings and plan described in this note.   GEN: No acute distress.   HEENT:  MMM, no JVD, no scleral icterus Cardiac: RRR, no murmurs, rubs, or gallops.  Respiratory: Clear to auscultation bilaterally. GI: Soft, nontender, non-distended  MS: No edema; No deformity. Neuro:  Nonfocal  Vasc:  +2 radial pulses  Patient had another episode of angina responsive to NTG at rest.  INR this AM 1.9; awaiting repeat INR given possible need to convert to femoral approach given mild LUE tremor.  Continue current medical therapy with ASA, hep, BB; follow up TTE today.  Lenna Sciara, MD Pager [phone]

## 2022-05-26 NOTE — Progress Notes (Signed)
[place] for warfarin >> heparin Indication: chest pain/ACS  Allergies  Allergen Reactions   Bee Venom Shortness Of Breath and Swelling   Codeine Phosphate Hives, Shortness Of Breath, Itching, Rash and Other (See Comments)    Tolerated multiple doses of IV morphine   Metoprolol Tartrate Other (See Comments)    loss of vision. Can take the XL tablet not regular    Pramipexole Other (See Comments)    Hallucinations     Tramadol     Pt has hallucinations    Ramipril Rash and Cough    Patient Measurements: Heparin dosing weight: 84 kg  Vital Signs: Temp: 98.8 F (37.1 C) (11/15 2002) Temp Source: Oral (11/15 2002) BP: 128/73 (11/15 2002) Pulse Rate: 80 (11/15 2002)  Labs: Recent Labs    05/25/22 1214 05/25/22 1454 05/25/22 1536 05/25/22 1713 05/25/22 1928 05/26/22 0835 05/26/22 1300 05/26/22 1926  HGB 14.2  --   --   --   --  14.6  --   --   HCT 44.6  --   --   --   --  44.2  --   --   PLT 228  --   --   --   --  230  --   --   LABPROT  --   --  23.0*  --   --  21.7* 22.1*  --   INR  --   --  2.1*  --   --  1.9* 2.0*  --   HEPARINUNFRC  --   --   --   --   --   --   --  0.27*  CREATININE 0.84  --   --   --   --  0.79  --   --   TROPONINIHS 143* 156*  --  133* 103*  --   --   --      Estimated Creatinine Clearance: 82.9 mL/min (by C-G formula based on SCr of 0.79 mg/dL).   Medical History: Past Medical History:  Diagnosis Date   Acute renal failure ([place]) 02/09/2013   Aortic stenosis 08/25/2010   a. Bentall aortic root replacement with a St. Jude mechanical valve and Hemashield conduit 02/2004.   Arthritis    Asthma    AV BLOCK, COMPLETE    a. s/p St Jude dual chamber pacemaker 02/2004.   CAD (coronary artery disease)    a. s/p CABGx2 (LIMA-dLAD, SVG-Cx). b. Low risk nuc 12/2012 without ischemia, EF 46% mild apical hypokinesia (EF 55% inf HK by echo).   Carotid artery disease (HCC)    a. 0-39% bilateral ICA stenosis, stable  mild hard plaque in carotid bulbs. F/u 03/2014 recommended.   Diabetes mellitus without complication ([place])    type 2    DIVERTICULOSIS, COLON 10/17/2007   Ejection fraction    a. EF 55% with inf HK, mild MR by echo 12/2012.   GERD (gastroesophageal reflux disease)    hxof    GOUT 01/16/2007   Headache    hx of migraines    HEMORRHOIDS, INTERNAL 11/01/2008   Hx of adenomatous polyp of colon 02/2019   no recall needed   HYPERLIPIDEMIA 01/16/2007   HYPERTENSION 01/16/2007   Impaired glucose tolerance    LEG EDEMA 11/27/2008   Special screening for malignant neoplasm of prostate    Warfarin anticoagulation    AVR    Medications:  Medications Prior to Admission  Medication Sig Dispense Refill Last Dose  acetaminophen (TYLENOL) 650 MG CR tablet Take 1,300 mg by mouth 2 (two) times daily.   05/25/2022   albuterol (VENTOLIN HFA) 108 (90 Base) MCG/ACT inhaler INHALE 2 PUFFS INTO THE LUNGS EVERY 6 HOURS AS NEEDED FOR WHEEZING OR SHORTNESS OF BREATH. (Patient taking differently: Inhale 1-2 puffs into the lungs every 6 (six) hours as needed for wheezing or shortness of breath.) 3 each 0 05/25/2022   Ascorbic Acid (VITAMIN C) 1000 MG tablet Take 1,000 mg by mouth daily.   05/25/2022   aspirin 81 MG tablet Take 81 mg by mouth every evening.    05/25/2022   atorvastatin (LIPITOR) 40 MG tablet TAKE 1 TABLET EVERY DAY AT 6 PM. (Patient taking differently: Take 40 mg by mouth daily.) 90 tablet 1 05/24/2022   calcium carbonate (OS-CAL) 600 MG TABS Take 600 mg by mouth 2 (two) times daily with a meal.   05/25/2022   carbidopa-levodopa (SINEMET CR) 50-200 MG tablet Take 1 tablet by mouth at bedtime.   05/24/2022   carbidopa-levodopa (SINEMET IR) 25-100 MG tablet Take 1 tablet by mouth 4 (four) times daily.   05/25/2022   cetirizine (ZYRTEC) 10 MG tablet Take 10 mg by mouth daily.    05/25/2022   cholecalciferol (VITAMIN D) 25 MCG (1000 UNIT) tablet Take 1,000 Units by mouth daily.   05/24/2022   fluticasone  (FLONASE) 50 MCG/ACT nasal spray Place 2 sprays into both nostrils daily. (Patient taking differently: Place 2 sprays into both nostrils daily as needed for allergies or rhinitis.) 16 g 6 Unknown   furosemide (LASIX) 40 MG tablet TAKE 1 TABLET EVERY DAY (Patient taking differently: Take 40 mg by mouth daily.) 90 tablet 1 05/25/2022   losartan (COZAAR) 100 MG tablet TAKE 1 TABLET EVERY DAY (PLEASE MAKE OVERDUE APPT WITH DR. Lovena Le BEFORE ANY MORE REFILLS, 2ND ATTEMPT, CALL [phone]) (Patient taking differently: Take 100 mg by mouth daily.) 90 tablet 1 05/24/2022   melatonin 5 MG TABS Take 5 mg by mouth at bedtime.   05/24/2022   metFORMIN (GLUCOPHAGE) 500 MG tablet TAKE 1/2 TABLET EVERY DAY WITH BREAKFAST - SCHEDULE APPT FOR FUTURE REFILLS (Patient taking differently: Take 250 mg by mouth daily.) 45 tablet 0 05/25/2022   Misc Natural Products (TART CHERRY ADVANCED) CAPS Take 2 capsules by mouth daily.    05/25/2022   Multiple Vitamin (MULTIVITAMIN) tablet Take 1 tablet by mouth daily.     05/25/2022   nitroGLYCERIN (NITROSTAT) 0.4 MG SL tablet Place 1 tablet (0.4 mg total) under the tongue every 5 (five) minutes as needed. For chest pain 75 tablet 2 05/25/2022   potassium chloride (KLOR-CON) 10 MEQ tablet TAKE 1 TABLET EVERY DAY (Patient taking differently: Take 10 mEq by mouth once.) 90 tablet 2 05/25/2022   Probiotic Product ([place] PO) Take 1 capsule by mouth daily.   05/25/2022   vitamin B-12 (CYANOCOBALAMIN) 1000 MCG tablet Take 1,000 mcg by mouth daily.   05/25/2022   warfarin (COUMADIN) 10 MG tablet TAKE 1/2 TO 1 TABLET DAILY OR AS DIRECTED BY COUMADIN CLINIC (Patient taking differently: Take 5-10 mg by mouth at bedtime. Take 5 mg (1/2 tablet) by mouth on Sunday's, Tuesday's, and Thursday's. On the other days of the week take 10 mg (1 tablet)) 90 tablet 1 05/24/2022 at 1930   Accu-Chek FastClix Lancets MISC USE TO CHECK BLOOD SUGAR TWICE A DAY AND AS NEEDED 306 each 2    Blood  Glucose Monitoring Suppl (ACCU-CHEK GUIDE) w/Device KIT Use as directed  1 kit 0    glucose blood (ACCU-CHEK SMARTVIEW) test strip Use as instructed 300 strip 2    metoprolol succinate (TOPROL-XL) 100 MG 24 hr tablet TAKE 1 TABLET EVERY DAY (Patient taking differently: Take 100 mg by mouth daily.) 90 tablet 3  at 0900    Scheduled:   vitamin C  1,000 mg Oral Daily   aspirin  81 mg Oral QPM   atorvastatin  40 mg Oral Daily   calcium carbonate  1 tablet Oral BID WC   carbidopa-levodopa  1 tablet Oral QHS   carbidopa-levodopa  1 tablet Oral QID   cholecalciferol  1,000 Units Oral Daily   cyanocobalamin  1,000 mcg Oral Daily   furosemide  40 mg Oral Daily   insulin aspart  0-15 Units Subcutaneous TID WC   loratadine  10 mg Oral Daily   losartan  100 mg Oral Daily   melatonin  5 mg Oral QHS   metoprolol succinate  100 mg Oral Daily   multivitamin with minerals  1 tablet Oral Daily   potassium chloride  10 mEq Oral Daily   Infusions:   heparin 1,250 Units/hr (05/26/22 1244)   PRN: acetaminophen, albuterol, fluticasone, nitroGLYCERIN, ondansetron (ZOFRAN) IV  Assessment: 31 yom with a history of CAD (s/p CABG), aortic stenosis (s/p Bentall aortic root replacement w/ St.Jude mechanical valve), CHB (s/p PPM 2005), T2DM, HTN, HLD, Parkinson disease and GERD. Patient is presenting with chest pain. Heparin per pharmacy consult placed for chest pain/ACS.  Patient is on warfarin prior to arrival. Last dose 11/13. Home dose is 5 mg (10 mg x 0.5) every Sun, Tue, Thu; 10 mg (10 mg x 1) all other days. INR goal per last anticoag visit with cardiology is 2-3.  Plan to start heparin when INR <2. INR down to 1.9 today. CBC WNL. No bleed issues reported.  Heparin level came back at 0.27 tonight. We will increase rate and check level in AM.  Goal of Therapy:  Heparin level 0.3-0.7 units/ml Monitor platelets by anticoagulation protocol: Yes   Plan:  Increase heparin to 1400 units/hr Check AM  heparin level  Monitor daily CBC, s/sx bleeding F/u plans for cath when INR <1.8    Onnie Boer, PharmD, BCIDP, AAHIVP, CPP Infectious Disease Pharmacist 05/26/2022 8:36 PM

## 2022-05-26 NOTE — ED Notes (Signed)
Pt denies CP at this time 

## 2022-05-26 NOTE — ED Notes (Signed)
Pt having 7/10 mid sternal sharp non-radiating CP, w/ sweating. Cardiologist at bedside and instructed to give nitro

## 2022-05-26 NOTE — Progress Notes (Signed)
*  PRELIMINARY RESULTS* Echocardiogram 2D Echocardiogram has been performed.  [name] 05/26/2022, 4:18 PM

## 2022-05-26 NOTE — ED Notes (Signed)
Patient resting, no s/s of any distress. No verbal c/o pain or discomfort. Patient able to follow verbal commands without any issues. VSS Will continue to monitor

## 2022-05-26 NOTE — Progress Notes (Signed)
[place] for warfarin >> heparin Indication: chest pain/ACS  Allergies  Allergen Reactions   Bee Venom Shortness Of Breath and Swelling   Codeine Phosphate Hives, Shortness Of Breath, Itching, Rash and Other (See Comments)    Tolerated multiple doses of IV morphine   Metoprolol Tartrate Other (See Comments)    loss of vision. Can take the XL tablet not regular    Pramipexole Other (See Comments)    Hallucinations     Tramadol     Pt has hallucinations    Ramipril Rash and Cough    Patient Measurements: Heparin dosing weight: 84 kg  Vital Signs: Temp: 97.4 F (36.3 C) (11/15 0402) Temp Source: Oral (11/15 0018) BP: 112/61 (11/15 0729) Pulse Rate: 59 (11/15 0729)  Labs: Recent Labs    05/25/22 1214 05/25/22 1454 05/25/22 1536 05/25/22 1713 05/25/22 1928  HGB 14.2  --   --   --   --   HCT 44.6  --   --   --   --   PLT 228  --   --   --   --   LABPROT  --   --  23.0*  --   --   INR  --   --  2.1*  --   --   CREATININE 0.84  --   --   --   --   TROPONINIHS 143* 156*  --  133* 103*     CrCl cannot be calculated (Unknown ideal weight.).   Medical History: Past Medical History:  Diagnosis Date   Acute renal failure ([place]) 02/09/2013   Aortic stenosis 08/25/2010   a. Bentall aortic root replacement with a St. Jude mechanical valve and Hemashield conduit 02/2004.   Arthritis    Asthma    AV BLOCK, COMPLETE    a. s/p St Jude dual chamber pacemaker 02/2004.   CAD (coronary artery disease)    a. s/p CABGx2 (LIMA-dLAD, SVG-Cx). b. Low risk nuc 12/2012 without ischemia, EF 46% mild apical hypokinesia (EF 55% inf HK by echo).   Carotid artery disease (HCC)    a. 0-39% bilateral ICA stenosis, stable mild hard plaque in carotid bulbs. F/u 03/2014 recommended.   Diabetes mellitus without complication ([place])    type 2    DIVERTICULOSIS, COLON 10/17/2007   Ejection fraction    a. EF 55% with inf HK, mild MR by echo 12/2012.   GERD  (gastroesophageal reflux disease)    hxof    GOUT 01/16/2007   Headache    hx of migraines    HEMORRHOIDS, INTERNAL 11/01/2008   Hx of adenomatous polyp of colon 02/2019   no recall needed   HYPERLIPIDEMIA 01/16/2007   HYPERTENSION 01/16/2007   Impaired glucose tolerance    LEG EDEMA 11/27/2008   Special screening for malignant neoplasm of prostate    Warfarin anticoagulation    AVR    Medications:  (Not in a hospital admission)  Scheduled:   vitamin C  1,000 mg Oral Daily   aspirin  81 mg Oral QPM   atorvastatin  40 mg Oral Daily   calcium carbonate  1 tablet Oral BID WC   carbidopa-levodopa  1 tablet Oral QHS   carbidopa-levodopa  1 tablet Oral QID   cholecalciferol  1,000 Units Oral Daily   cyanocobalamin  1,000 mcg Oral Daily   furosemide  40 mg Oral Daily   insulin aspart  0-15 Units Subcutaneous TID WC   loratadine  10  mg Oral Daily   losartan  100 mg Oral Daily   melatonin  5 mg Oral QHS   metoprolol succinate  100 mg Oral Daily   multivitamin with minerals  1 tablet Oral Daily   potassium chloride  10 mEq Oral Daily   Infusions:  PRN: acetaminophen, albuterol, fluticasone, nitroGLYCERIN, ondansetron (ZOFRAN) IV  Assessment: 65 yom with a history of CAD (s/p CABG), aortic stenosis (s/p Bentall aortic root replacement w/ St.Jude mechanical valve), CHB (s/p PPM 2005), T2DM, HTN, HLD, Parkinson disease and GERD. Patient is presenting with chest pain. Heparin per pharmacy consult placed for chest pain/ACS.  Patient is on warfarin prior to arrival. Last dose 11/13. Home dose is 5 mg (10 mg x 0.5) every Sun, Tue, Thu; 10 mg (10 mg x 1) all other days. INR goal per last anticoag visit with cardiology is 2-3.  Plan to start heparin when INR <2. INR down to 1.9 today. CBC WNL. No bleed issues reported.  Goal of Therapy:  Heparin level 0.3-0.7 units/ml Monitor platelets by anticoagulation protocol: Yes   Plan:  No bolus with INR 1.9. Start heparin at 1250 units/hr Check 8hr  heparin level from start Monitor daily CBC, s/sx bleeding F/u plans for cath when INR <1.8 - Cardiology rechecking INR this afternoon   Arturo Morton, PharmD, BCPS Please check AMION for all [place] contact numbers Clinical Pharmacist 05/26/2022 7:42 AM

## 2022-05-26 NOTE — ED Notes (Signed)
Educated pt to hit the call light to notify staff ASAP if he starts having CP, shob or not feeling right. Pt verbalized understanding.

## 2022-05-26 NOTE — ED Notes (Signed)
Primary RN drawing AM labs at this time

## 2022-05-27 ENCOUNTER — Encounter (HOSPITAL_COMMUNITY)
Admission: EM | Disposition: A | Discharge status: Home / Self Care | Payer: Self-pay | Source: Home / Self Care | Attending: Internal Medicine

## 2022-05-27 DIAGNOSIS — E785 Hyperlipidemia, unspecified: Secondary | ICD-10-CM

## 2022-05-27 DIAGNOSIS — I214 Non-ST elevation (NSTEMI) myocardial infarction: Secondary | ICD-10-CM | POA: Diagnosis not present

## 2022-05-27 DIAGNOSIS — I2510 Atherosclerotic heart disease of native coronary artery without angina pectoris: Secondary | ICD-10-CM | POA: Diagnosis not present

## 2022-05-27 HISTORY — PX: LEFT HEART CATH AND CORS/GRAFTS ANGIOGRAPHY: CATH118250

## 2022-05-27 LAB — CBC
HCT: 42.2 % (ref 39.0–52.0)
Hemoglobin: 13.8 g/dL (ref 13.0–17.0)
MCH: 29.2 pg (ref 26.0–34.0)
MCHC: 32.7 g/dL (ref 30.0–36.0)
MCV: 89.4 fL (ref 80.0–100.0)
Platelets: 209 10*3/uL (ref 150–400)
RBC: 4.72 MIL/uL (ref 4.22–5.81)
RDW: 14.8 % (ref 11.5–15.5)
WBC: 8.3 10*3/uL (ref 4.0–10.5)
nRBC: 0 % (ref 0.0–0.2)

## 2022-05-27 LAB — LIPOPROTEIN A (LPA): Lipoprotein (a): 216 nmol/L — ABNORMAL HIGH (ref <75.0)

## 2022-05-27 LAB — GLUCOSE, CAPILLARY
Glucose-Capillary: 120 mg/dL — ABNORMAL HIGH (ref 70–99)
Glucose-Capillary: 107 mg/dL — ABNORMAL HIGH (ref 70–99)
Glucose-Capillary: 115 mg/dL — ABNORMAL HIGH (ref 70–99)
Glucose-Capillary: 122 mg/dL — ABNORMAL HIGH (ref 70–99)

## 2022-05-27 LAB — HEPARIN LEVEL (UNFRACTIONATED): Heparin Unfractionated: 0.68 IU/mL (ref 0.30–0.70)

## 2022-05-27 LAB — PROTIME-INR
INR: 1.5 — ABNORMAL HIGH (ref 0.8–1.2)
Prothrombin Time: 17.7 seconds — ABNORMAL HIGH (ref 11.4–15.2)

## 2022-05-27 SURGERY — LEFT HEART CATH AND CORS/GRAFTS ANGIOGRAPHY
Anesthesia: LOCAL

## 2022-05-27 MED ORDER — SODIUM CHLORIDE 0.9 % IV SOLN: 250.0000 mL | INTRAVENOUS | Status: DC | PRN

## 2022-05-27 MED ORDER — SODIUM CHLORIDE 0.9 % WEIGHT BASED INFUSION: 3.0000 mL/kg/h | INTRAVENOUS | Status: DC

## 2022-05-27 MED ORDER — SODIUM CHLORIDE 0.9% FLUSH: 3.0000 mL | INTRAVENOUS | Status: DC | PRN

## 2022-05-27 MED ORDER — HEPARIN (PORCINE) IN NACL 1000-0.9 UT/500ML-% IV SOLN
INTRAVENOUS | Status: DC | PRN
  Administered 2022-05-27 (×3): 500 mL

## 2022-05-27 MED ORDER — LIDOCAINE HCL (PF) 1 % IJ SOLN
INTRAMUSCULAR | Status: DC | PRN
  Administered 2022-05-27: 2 mL

## 2022-05-27 MED ORDER — HEPARIN (PORCINE) IN NACL 1000-0.9 UT/500ML-% IV SOLN
INTRAVENOUS | Status: AC
  Filled 2022-05-27: qty 1000

## 2022-05-27 MED ORDER — SODIUM CHLORIDE 0.9 % IV SOLN: INTRAVENOUS | Status: AC

## 2022-05-27 MED ORDER — VERAPAMIL HCL 2.5 MG/ML IV SOLN
INTRAVENOUS | Status: AC
  Filled 2022-05-27: qty 2

## 2022-05-27 MED ORDER — MIDAZOLAM HCL 2 MG/2ML IJ SOLN
INTRAMUSCULAR | Status: AC
  Filled 2022-05-27: qty 2

## 2022-05-27 MED ORDER — VERAPAMIL HCL 2.5 MG/ML IV SOLN
INTRAVENOUS | Status: DC | PRN
  Administered 2022-05-27: 10 mL via INTRA_ARTERIAL

## 2022-05-27 MED ORDER — LABETALOL HCL 5 MG/ML IV SOLN: 10.0000 mg | INTRAVENOUS | Status: AC | PRN

## 2022-05-27 MED ORDER — HEPARIN (PORCINE) 25000 UT/250ML-% IV SOLN
1400.0000 [IU]/h | INTRAVENOUS | Status: DC
  Administered 2022-05-28: 1400 [IU]/h via INTRAVENOUS

## 2022-05-27 MED ORDER — SODIUM CHLORIDE 0.9 % WEIGHT BASED INFUSION
1.0000 mL/kg/h | INTRAVENOUS | Status: DC
  Administered 2022-05-27: 1 mL/kg/h via INTRAVENOUS

## 2022-05-27 MED ORDER — NITROGLYCERIN 1 MG/10 ML FOR IR/CATH LAB
INTRA_ARTERIAL | Status: AC
  Filled 2022-05-27: qty 10

## 2022-05-27 MED ORDER — HEPARIN SODIUM (PORCINE) 1000 UNIT/ML IJ SOLN
INTRAMUSCULAR | Status: DC | PRN
  Administered 2022-05-27: 5000 [IU] via INTRAVENOUS

## 2022-05-27 MED ORDER — IOHEXOL 350 MG/ML SOLN
INTRAVENOUS | Status: DC | PRN
  Administered 2022-05-27: 120 mL

## 2022-05-27 MED ORDER — ONDANSETRON HCL 4 MG/2ML IJ SOLN: 4.0000 mg | Freq: Four times a day (QID) | INTRAMUSCULAR | Status: DC | PRN

## 2022-05-27 MED ORDER — SODIUM CHLORIDE 0.9% FLUSH: 3.0000 mL | Freq: Two times a day (BID) | INTRAVENOUS | Status: DC

## 2022-05-27 MED ORDER — HYDRALAZINE HCL 20 MG/ML IJ SOLN: 10.0000 mg | INTRAMUSCULAR | Status: AC | PRN

## 2022-05-27 MED ORDER — FENTANYL CITRATE (PF) 100 MCG/2ML IJ SOLN
INTRAMUSCULAR | Status: DC | PRN
  Administered 2022-05-27 (×3): 25 ug via INTRAVENOUS

## 2022-05-27 MED ORDER — MIDAZOLAM HCL 2 MG/2ML IJ SOLN
INTRAMUSCULAR | Status: DC | PRN
  Administered 2022-05-27: 1 mg via INTRAVENOUS
  Administered 2022-05-27: 2 mg via INTRAVENOUS

## 2022-05-27 MED ORDER — FENTANYL CITRATE (PF) 100 MCG/2ML IJ SOLN
INTRAMUSCULAR | Status: AC
  Filled 2022-05-27: qty 2

## 2022-05-27 MED ORDER — ACETAMINOPHEN 325 MG PO TABS: 650.0000 mg | ORAL_TABLET | ORAL | Status: DC | PRN

## 2022-05-27 MED ORDER — LIDOCAINE HCL (PF) 1 % IJ SOLN
INTRAMUSCULAR | Status: AC
  Filled 2022-05-27: qty 30

## 2022-05-27 MED ORDER — ASPIRIN 81 MG PO CHEW
81.0000 mg | CHEWABLE_TABLET | ORAL | Status: AC
  Administered 2022-05-27: 81 mg via ORAL
  Filled 2022-05-27: qty 1

## 2022-05-27 MED ORDER — HEPARIN SODIUM (PORCINE) 1000 UNIT/ML IJ SOLN
INTRAMUSCULAR | Status: AC
  Filled 2022-05-27: qty 10

## 2022-05-27 MED ORDER — SODIUM CHLORIDE 0.9% FLUSH
3.0000 mL | Freq: Two times a day (BID) | INTRAVENOUS | Status: DC
  Administered 2022-05-27 – 2022-05-28 (×2): 3 mL via INTRAVENOUS

## 2022-05-27 SURGICAL SUPPLY — 16 items
CATH INFINITI 5 FR AR1 MOD (CATHETERS) IMPLANT
CATH INFINITI 5 FR IM (CATHETERS) IMPLANT
CATH INFINITI 5 FR LCB (CATHETERS) IMPLANT
CATH INFINITI 5FR AL1 (CATHETERS) IMPLANT
CATH INFINITI 5FR MULTPACK ANG (CATHETERS) IMPLANT
DEVICE RAD COMP TR BAND LRG (VASCULAR PRODUCTS) IMPLANT
ELECT DEFIB PAD ADLT CADENCE (PAD) IMPLANT
GLIDESHEATH SLEND SS 6F .021 (SHEATH) IMPLANT
GUIDEWIRE INQWIRE 1.5J.035X260 (WIRE) IMPLANT
INQWIRE 1.5J .035X260CM (WIRE) ×1
KIT HEART LEFT (KITS) ×1 IMPLANT
MAT PREVALON FULL STRYKER (MISCELLANEOUS) IMPLANT
PACK CARDIAC CATHETERIZATION (CUSTOM PROCEDURE TRAY) ×1 IMPLANT
SHEATH PROBE COVER 6X72 (BAG) IMPLANT
TRANSDUCER W/STOPCOCK (MISCELLANEOUS) ×1 IMPLANT
TUBING CIL FLEX 10 FLL-RA (TUBING) ×1 IMPLANT

## 2022-05-27 NOTE — Progress Notes (Addendum)
Rounding Note    Patient Name: [name] Date of Encounter: 05/27/2022  [place] Cardiologist: Candee Furbish, MD   Subjective   Patient denies chest pain, shortness of breath, palpitations, dizziness.  He is comfortably laying in bed on supine. Patient is scheduled for cardiac cath today.  On heparin IV infusion. Vitals stable.  Inpatient Medications    Scheduled Meds:  vitamin C  1,000 mg Oral Daily   aspirin  81 mg Oral QPM   atorvastatin  40 mg Oral Daily   calcium carbonate  1 tablet Oral BID WC   carbidopa-levodopa  1 tablet Oral QHS   carbidopa-levodopa  1 tablet Oral QID   cholecalciferol  1,000 Units Oral Daily   cyanocobalamin  1,000 mcg Oral Daily   furosemide  40 mg Oral Daily   insulin aspart  0-15 Units Subcutaneous TID WC   loratadine  10 mg Oral Daily   losartan  100 mg Oral Daily   melatonin  5 mg Oral QHS   metoprolol succinate  100 mg Oral Daily   multivitamin with minerals  1 tablet Oral Daily   potassium chloride  10 mEq Oral Daily   sodium chloride flush  3 mL Intravenous Q12H   Continuous Infusions:  sodium chloride     sodium chloride 1 mL/kg/hr (05/27/22 0903)   heparin 1,400 Units/hr (05/27/22 0716)   PRN Meds: sodium chloride, acetaminophen, albuterol, fluticasone, nitroGLYCERIN, ondansetron (ZOFRAN) IV, sodium chloride flush   Vital Signs    Vitals:   05/26/22 1400 05/26/22 1441 05/26/22 2002 05/27/22 0422  BP: 120/66 136/78 128/73 (!) 160/91  Pulse: 70 71 80 99  Resp: '15  19 20  '$ Temp: 98.7 F (37.1 C) 98.5 F (36.9 C) 98.8 F (37.1 C) 98.4 F (36.9 C)  TempSrc: Oral Oral Oral Oral  SpO2: 97% 98% 97% 99%  Weight:      Height:        Intake/Output Summary (Last 24 hours) at 05/27/2022 0945 Last data filed at 05/27/2022 0768 Gross per 24 hour  Intake 187.65 ml  Output 600 ml  Net -412.35 ml      05/26/2022   10:00 AM 05/10/2022    3:46 PM 11/04/2021    9:01 AM  Last 3 Weights  Weight (lbs) 207 lb  207 lb 203 lb  Weight (kg) 93.895 kg 93.895 kg 92.08 kg      Telemetry    A paced rhythm- Personally Reviewed  ECG    No new EKG today  Physical Exam  Patient comfortably laying in bed on supine.  He is communicating with complete sentences.  No acute visible distress. GEN: No acute distress.   Neck: No JVD Cardiac: RRR, no murmurs, rubs, or gallops.  Respiratory: Clear to auscultation bilaterally.  No wheeze, Rales or rhonchi appreciated to auscultation GI: Soft, nontender, non-distended  MS: No lower extremity edema  neuro:  Nonfocal  Psych: Normal affect.  Alert and oriented x3  Labs    High Sensitivity Troponin:   Recent Labs  Lab 05/25/22 1214 05/25/22 1454 05/25/22 1713 05/25/22 1928  TROPONINIHS 143* 156* 133* 103*     Chemistry Recent Labs  Lab 05/25/22 1214 05/25/22 1713 05/26/22 0835  NA 141  --  141  K 4.0  --  4.5  CL 106  --  105  CO2 26  --  30  GLUCOSE 131*  --  122*  BUN 14  --  17  CREATININE 0.84  --  0.79  CALCIUM 9.2  --  9.1  MG  --  2.0  --   PROT 6.0*  --   --   ALBUMIN 3.8  --   --   AST 37  --   --   ALT 10  --   --   ALKPHOS 47  --   --   BILITOT 0.8  --   --   GFRNONAA >60  --  >60  ANIONGAP 9  --  6    Lipids  Recent Labs  Lab 05/26/22 0835  CHOL 149  TRIG 110  HDL 48  LDLCALC 79  CHOLHDL 3.1    Hematology Recent Labs  Lab 05/25/22 1214 05/26/22 0835 05/27/22 0535  WBC 7.8 8.3 8.3  RBC 4.85 4.88 4.72  HGB 14.2 14.6 13.8  HCT 44.6 44.2 42.2  MCV 92.0 90.6 89.4  MCH 29.3 29.9 29.2  MCHC 31.8 33.0 32.7  RDW 14.8 15.0 14.8  PLT 228 230 209   Thyroid  Recent Labs  Lab 05/25/22 1740  TSH 0.716    BNPNo results for input(s): "BNP", "PROBNP" in the last 168 hours.  DDimer No results for input(s): "DDIMER" in the last 168 hours.   Radiology    ECHOCARDIOGRAM COMPLETE  Result Date: 05/26/2022    ECHOCARDIOGRAM REPORT   Patient Name:   [name] [facility] Date of Exam: 05/26/2022 Medical Rec #:  [id number]        Height:       66.0 in Accession #:    [id number]      Weight:       207.0 lb Date of Birth:  08/14/1945      BSA:          2.029 m Patient Age:    5 years        BP:           136/78 mmHg Patient Gender: M               HR:           71 bpm. Exam Location:  Inpatient Procedure: 2D Echo, Cardiac Doppler and Color Doppler Indications:    Chest Pain R07.9  History:        Patient has prior history of Echocardiogram examinations, most                 recent 04/13/2021. Previous Myocardial Infarction and CAD, Prior                 CABG and Pacemaker; Risk Factors:Hypertension, Dyslipidemia and                 Diabetes. Bentall Aortic root replacement and hemishield                 conduit.                  Aortic Valve: mechanical valve is present in the aortic                 position.  Sonographer:    Alvino Chapel RCS Referring Phys: [id number] [place]  1. Left ventricular ejection fraction, by estimation, is 50 to 55%. The left ventricle has low normal function. The left ventricle demonstrates regional wall motion abnormalities (see scoring diagram/findings for description). There is mild left ventricular hypertrophy. Left ventricular diastolic parameters are consistent with Grade I diastolic dysfunction (impaired relaxation).  2. Right ventricular systolic function is normal. The right  ventricular size is normal.  3. The mitral valve is normal in structure. No evidence of mitral valve regurgitation. No evidence of mitral stenosis.  4. The aortic valve has been repaired/replaced. Aortic valve regurgitation is not visualized. No aortic stenosis is present. There is a mechanical valve present in the aortic position. Echo findings are consistent with normal structure and function of the aortic valve prosthesis.  5. Bentall. Aortic root/ascending aorta has been repaired/replaced.  6. The inferior vena cava is normal in size with greater than 50% respiratory variability, suggesting right atrial  pressure of 3 mmHg. Comparison(s): Prior images reviewed side by side. FINDINGS  Left Ventricle: Left ventricular ejection fraction, by estimation, is 50 to 55%. The left ventricle has low normal function. The left ventricle demonstrates regional wall motion abnormalities. The left ventricular internal cavity size was normal in size. There is mild left ventricular hypertrophy. Left ventricular diastolic parameters are consistent with Grade I diastolic dysfunction (impaired relaxation).  LV Wall Scoring: The apical septal segment is akinetic. Right Ventricle: The right ventricular size is normal. No increase in right ventricular wall thickness. Right ventricular systolic function is normal. Left Atrium: Left atrial size was normal in size. Right Atrium: Right atrial size was normal in size. Pericardium: There is no evidence of pericardial effusion. Mitral Valve: The mitral valve is normal in structure. No evidence of mitral valve regurgitation. No evidence of mitral valve stenosis. Tricuspid Valve: The tricuspid valve is normal in structure. Tricuspid valve regurgitation is not demonstrated. No evidence of tricuspid stenosis. Aortic Valve: The aortic valve has been repaired/replaced. Aortic valve regurgitation is not visualized. No aortic stenosis is present. Aortic valve mean gradient measures 6.0 mmHg. Aortic valve peak gradient measures 12.4 mmHg. Aortic valve area, by VTI  measures 1.09 cm. There is a mechanical valve present in the aortic position. Echo findings are consistent with normal structure and function of the aortic valve prosthesis. Pulmonic Valve: The pulmonic valve was normal in structure. Pulmonic valve regurgitation is not visualized. No evidence of pulmonic stenosis. Aorta: Bentall. The aortic root/ascending aorta has been repaired/replaced. Venous: The inferior vena cava is normal in size with greater than 50% respiratory variability, suggesting right atrial pressure of 3 mmHg. IAS/Shunts: No  atrial level shunt detected by color flow Doppler. Additional Comments: A device lead is visualized in the right ventricle.  LEFT VENTRICLE PLAX 2D LVIDd:         5.20 cm LVIDs:         3.60 cm LV PW:         1.30 cm LV IVS:        1.30 cm LVOT diam:     1.80 cm LV SV:         40 LV SV Index:   19 LVOT Area:     2.54 cm  RIGHT VENTRICLE RV S prime:     10.40 cm/s TAPSE (M-mode): 1.8 cm LEFT ATRIUM             Index        RIGHT ATRIUM           Index LA diam:        3.65 cm 1.80 cm/m   RA Area:     24.50 cm LA Vol (A2C):   67.0 ml 33.02 ml/m  RA Volume:   87.40 ml  43.07 ml/m LA Vol (A4C):   54.1 ml 26.66 ml/m LA Biplane Vol: 67.2 ml 33.12 ml/m  AORTIC VALVE AV Area (Vmax):  1.04 cm AV Area (Vmean):   1.13 cm AV Area (VTI):     1.09 cm AV Vmax:           176.00 cm/s AV Vmean:          115.000 cm/s AV VTI:            0.363 m AV Peak Grad:      12.4 mmHg AV Mean Grad:      6.0 mmHg LVOT Vmax:         71.60 cm/s LVOT Vmean:        51.250 cm/s LVOT VTI:          0.156 m LVOT/AV VTI ratio: 0.43  AORTA Ao Root diam: 3.30 cm MITRAL VALVE MV Area (PHT): 2.32 cm     SHUNTS MV Decel Time: 327 msec     Systemic VTI:  0.16 m MV E velocity: 74.40 cm/s   Systemic Diam: 1.80 cm MV A velocity: 102.00 cm/s MV E/A ratio:  0.73 Candee Furbish MD Electronically signed by Candee Furbish MD Signature Date/Time: 05/26/2022/4:40:27 PM    Final    DG Chest 1 View  Result Date: 05/25/2022 CLINICAL DATA:  Chest pain beginning today EXAM: CHEST  1 VIEW COMPARISON:  06/07/2016 FINDINGS: Previous median sternotomy, CABG and mitral valve replacement. Heart size remains normal. Tortuous aorta as seen previously. Dual lead pacemaker in place. The pulmonary vascularity is normal. The lungs are clear. No acute bone finding. IMPRESSION: No active disease. Previous CABG and mitral valve replacement. Dual lead pacemaker. Electronically Signed   By: Nelson Chimes M.D.   On: 05/25/2022 12:44    Cardiac Studies   [employer]  05/26/2022  ECHOCARDIOGRAM REPORT       Patient Name:   [name] [facility] Date of Exam: 05/26/2022  Medical Rec #:  [id number]       Height:       66.0 in  Accession #:    [id number]      Weight:       207.0 lb  Date of Birth:  Jul 08, 1946      BSA:          2.029 m  Patient Age:    31 years        BP:           136/78 mmHg  Patient Gender: M               HR:           71 bpm.  Exam Location:  Inpatient   Procedure: 2D Echo, Cardiac Doppler and Color Doppler   Indications:    Chest Pain R07.9    History:        Patient has prior history of Echocardiogram examinations,  most                 recent 04/13/2021. Previous Myocardial Infarction and CAD,  Prior                 CABG and Pacemaker; Risk Factors:Hypertension,  Dyslipidemia and                  Diabetes. Bentall Aortic root replacement and hemishield                  conduit.                    Aortic Valve: mechanical valve is present in the aortic  position.    Sonographer:    Alvino Chapel RCS  Referring Phys: [id number] [place]     1. Left ventricular ejection fraction, by estimation, is 50 to 55%. The  left ventricle has low normal function. The left ventricle demonstrates  regional wall motion abnormalities (see scoring diagram/findings for  description). There is mild left  ventricular hypertrophy. Left ventricular diastolic parameters are  consistent with Grade I diastolic dysfunction (impaired relaxation).   2. Right ventricular systolic function is normal. The right ventricular  size is normal.   3. The mitral valve is normal in structure. No evidence of mitral valve  regurgitation. No evidence of mitral stenosis.   4. The aortic valve has been repaired/replaced. Aortic valve  regurgitation is not visualized. No aortic stenosis is present. There is a  mechanical valve present in the aortic position. Echo findings are  consistent with normal structure and function of   the aortic valve prosthesis.   5. Bentall. Aortic root/ascending aorta has been repaired/replaced.   6. The inferior vena cava is normal in size with greater than 50%  respiratory variability, suggesting right atrial pressure of 3 mmHg.   Comparison(s): Prior images reviewed side by side.   FINDINGS   Left Ventricle: Left ventricular ejection fraction, by estimation, is 50  to 55%. The left ventricle has low normal function. The left ventricle  demonstrates regional wall motion abnormalities. The left ventricular  internal cavity size was normal in  size. There is mild left ventricular hypertrophy. Left ventricular  diastolic parameters are consistent with Grade I diastolic dysfunction  (impaired relaxation).     LV Wall Scoring:  The apical septal segment is akinetic.   Right Ventricle: The right ventricular size is normal. No increase in  right ventricular wall thickness. Right ventricular systolic function is  normal.   Left Atrium: Left atrial size was normal in size.   Right Atrium: Right atrial size was normal in size.   Pericardium: There is no evidence of pericardial effusion.   Mitral Valve: The mitral valve is normal in structure. No evidence of  mitral valve regurgitation. No evidence of mitral valve stenosis.   Tricuspid Valve: The tricuspid valve is normal in structure. Tricuspid  valve regurgitation is not demonstrated. No evidence of tricuspid  stenosis.   Aortic Valve: The aortic valve has been repaired/replaced. Aortic valve  regurgitation is not visualized. No aortic stenosis is present. Aortic  valve mean gradient measures 6.0 mmHg. Aortic valve peak gradient measures  12.4 mmHg. Aortic valve area, by VTI   measures 1.09 cm. There is a mechanical valve present in the aortic  position. Echo findings are consistent with normal structure and function  of the aortic valve prosthesis.   Pulmonic Valve: The pulmonic valve was normal in structure. Pulmonic  valve  regurgitation is not visualized. No evidence of pulmonic stenosis.   Aorta: Bentall. The aortic root/ascending aorta has been  repaired/replaced.   Venous: The inferior vena cava is normal in size with greater than 50%  respiratory variability, suggesting right atrial pressure of 3 mmHg.   IAS/Shunts: No atrial level shunt detected by color flow Doppler.   Additional Comments: A device lead is visualized in the right ventricle.     LEFT VENTRICLE  PLAX 2D  LVIDd:         5.20 cm  LVIDs:         3.60 cm  LV PW:  1.30 cm  LV IVS:        1.30 cm  LVOT diam:     1.80 cm  LV SV:         40  LV SV Index:   19  LVOT Area:     2.54 cm     RIGHT VENTRICLE  RV S prime:     10.40 cm/s  TAPSE (M-mode): 1.8 cm   LEFT ATRIUM             Index        RIGHT ATRIUM           Index  LA diam:        3.65 cm 1.80 cm/m   RA Area:     24.50 cm  LA Vol (A2C):   67.0 ml 33.02 ml/m  RA Volume:   87.40 ml  43.07 ml/m  LA Vol (A4C):   54.1 ml 26.66 ml/m  LA Biplane Vol: 67.2 ml 33.12 ml/m   AORTIC VALVE  AV Area (Vmax):    1.04 cm  AV Area (Vmean):   1.13 cm  AV Area (VTI):     1.09 cm  AV Vmax:           176.00 cm/s  AV Vmean:          115.000 cm/s  AV VTI:            0.363 m  AV Peak Grad:      12.4 mmHg  AV Mean Grad:      6.0 mmHg  LVOT Vmax:         71.60 cm/s  LVOT Vmean:        51.250 cm/s  LVOT VTI:          0.156 m  LVOT/AV VTI ratio: 0.43    AORTA  Ao Root diam: 3.30 cm   MITRAL VALVE  MV Area (PHT): 2.32 cm     SHUNTS  MV Decel Time: 327 msec     Systemic VTI:  0.16 m  MV E velocity: 74.40 cm/s   Systemic Diam: 1.80 cm  MV A velocity: 102.00 cm/s  MV E/A ratio:  0.73   Candee Furbish MD  Electronically signed by Candee Furbish MD  Signature Date/Time: 05/26/2022/4:40:27 PM    ECHO 04/13/2021  ECHOCARDIOGRAM REPORT     IMPRESSIONS     1. Distal septal hypokinesis Inferior wall hypokinesis . Left ventricular  ejection fraction, by estimation,  is 45 to 50%. The left ventricle has  mildly decreased function. The left ventricle demonstrates regional wall  motion abnormalities (see scoring diagram/findings for description).      Patient Profile     [name] is a 76 y.o. male with  hx of  CAD (s/p CABG x 2 w/ LIMA-LAD and SVG-Cx in 2005), aortic stenosis (s/p Bentall aortic root replacement w/ St.Jude mechanical valve), on coumadin, complete heart block (s/p PPM 2005), Type 2 DM, HTN, HLD, Parkinson disease and GERD  who is being seen 05/25/2022 for the evaluation of Chest pain.   Stress test 01/2013: low risk  Echo 01/2016: LVEF 55% Echo 04/2020: LVEF 50-55% and grade 1 DD Echo  04/2021: LVEF 45-50%, grade 1 DD, stable mechanical aortic valve    CT angio of chest aorta 05/03/2022: Unchanged fusiform aneurysmal dilation of the distal ascending thoracic aorta measuring up to 4.5 cm.    Assessment & Plan    1) NSTEMI #Chest pain Patient  history  of known CAD s/p CABG  x 2 w/ LIMA-LAD and SVG-Cx in 2005), aortic stenosis (s/p Bentall aortic root replacement w/ St.Jude mechanical valve). He presented with ongoing chest pain and mildly elevated troponin. On evaluation today he denies chest pain, palpitations, shortness of breath, or dizziness. He is currently on IV heparin infusion.  He is hemodynamically stable.  Telemetry reflecting a paced rhythm. INR 1.5 today.Scr 0.79. Patient is scheduled for cardiac cath today. -Continue aspirin 81 mg once daily -Continue metoprolol succinate 100 mg once daily -Continue atorvastatin 40 mg once daily    2)  Aortic stenosis s/p Bentall aortic root replacement w/ St.Jude mechanical valve in 2005 -INR 1.5 today -Echo (05/26/2022); Left ventricular ejection fraction, by estimation, is 50 to 55%.   3) HTN:  -Continue Losartan 100 mg once daily -Continue Metoprolol succinate 100 mg once daily   4) HLD: Lipid panel was repeated.   LDL 79 (05/26/2022),  63 (03/2021).  -Continue Atorvastatin  40 mg once daily    5) DM -SSI     6)  HFmrEF - Echo  04/2021: LVEF 45-50%, grade 1 DD, stable mechanical aortic valve  - Echo (05/26/2022); Left ventricular ejection fraction, by estimation, is 50 to 55% - Continue Losartan 100 mg once daily -Continue Furosemide 40 mg once daily. - Continue Metoprolol Succinate 100 mg once daily - Update echo      For questions or updates, please contact [place] Please consult [email or web address] for contact info under        Signed, Teola Bradley, MD  05/27/2022, 9:45 AM    [id number]  ATTENDING ATTESTATION:  After conducting a review of all available clinical information with the care team, interviewing the patient, and performing a physical exam, I agree with the findings and plan described in this note.   GEN: No acute distress.   HEENT:  MMM, no JVD, no scleral icterus Cardiac: RRR, no murmurs, rubs, or gallops.  Respiratory: Clear to auscultation bilaterally. GI: Soft, nontender, non-distended  MS: No edema; No deformity. Neuro:  Nonfocal  Vasc:  +2 radial pulses  Patient had an episode of chest pain this AM which resolved with NTG.    TTE reassuring.  Cont current medical therapy.  Coronary angiography today.  He has a good left radial pulse.  Lenna Sciara, MD Pager [phone]

## 2022-05-27 NOTE — Progress Notes (Addendum)
Heart Failure Navigator Progress Note  Assessed for Heart & Vascular TOC clinic readiness.  Patient does not meet criteria due to not acute heart failure and ECHO improved from previous to 55-60% from 45-50%.   Navigator available for reassessment of patient.   Earnestine Leys, BSN, Clinical cytogeneticist Only

## 2022-05-27 NOTE — Progress Notes (Signed)
  Transition of Care [facility]) Screening Note   Patient Details  Name: Ephraim Reichel Jarvie Date of Birth: Mar 01, 1946   Transition of Care [facility]) CM/SW Contact:    Milas Gain, [place] Phone Number: 05/27/2022, 1:11 PM    Transition of Care Department [facility]) has reviewed patient and no TOC needs have been identified at this time. We will continue to monitor patient advancement through interdisciplinary progression rounds. If new patient transition needs arise, please place a TOC consult.

## 2022-05-27 NOTE — Progress Notes (Signed)
[name] for warfarin >> heparin Indication: chest pain/ACS  Allergies  Allergen Reactions   Bee Venom Shortness Of Breath and Swelling   Codeine Phosphate Hives, Shortness Of Breath, Itching, Rash and Other (See Comments)    Tolerated multiple doses of IV morphine   Metoprolol Tartrate Other (See Comments)    loss of vision. Can take the XL tablet not regular    Pramipexole Other (See Comments)    Hallucinations     Tramadol     Pt has hallucinations    Ramipril Rash and Cough    Patient Measurements: Heparin dosing weight: 84 kg  Vital Signs: Temp: 98.4 F (36.9 C) (11/16 0422) Temp Source: Oral (11/16 0422) BP: 160/91 (11/16 0422) Pulse Rate: 99 (11/16 0422)  Labs: Recent Labs    05/25/22 1214 05/25/22 1454 05/25/22 1536 05/25/22 1713 05/25/22 1928 05/26/22 0835 05/26/22 1300 05/26/22 1926 05/27/22 0535  HGB 14.2  --   --   --   --  14.6  --   --  13.8  HCT 44.6  --   --   --   --  44.2  --   --  42.2  PLT 228  --   --   --   --  230  --   --  209  LABPROT  --   --    < >  --   --  21.7* 22.1*  --  17.7*  INR  --   --    < >  --   --  1.9* 2.0*  --  1.5*  HEPARINUNFRC  --   --   --   --   --   --   --  0.27* 0.68  CREATININE 0.84  --   --   --   --  0.79  --   --   --   TROPONINIHS 143* 156*  --  133* 103*  --   --   --   --    < > = values in this interval not displayed.     Estimated Creatinine Clearance: 82.9 mL/min (by C-G formula based on SCr of 0.79 mg/dL).   Medical History: Past Medical History:  Diagnosis Date   Acute renal failure ([place]) 02/09/2013   Aortic stenosis 08/25/2010   a. Bentall aortic root replacement with a St. Jude mechanical valve and Hemashield conduit 02/2004.   Arthritis    Asthma    AV BLOCK, COMPLETE    a. s/p St Jude dual chamber pacemaker 02/2004.   CAD (coronary artery disease)    a. s/p CABGx2 (LIMA-dLAD, SVG-Cx). b. Low risk nuc 12/2012 without ischemia, EF 46% mild apical  hypokinesia (EF 55% inf HK by echo).   Carotid artery disease ([facility])    a. 0-39% bilateral ICA stenosis, stable mild hard plaque in carotid bulbs. F/u 03/2014 recommended.   Diabetes mellitus without complication ([place])    type 2    DIVERTICULOSIS, COLON 10/17/2007   Ejection fraction    a. EF 55% with inf HK, mild MR by echo 12/2012.   GERD (gastroesophageal reflux disease)    hxof    GOUT 01/16/2007   Headache    hx of migraines    HEMORRHOIDS, INTERNAL 11/01/2008   Hx of adenomatous polyp of colon 02/2019   no recall needed   HYPERLIPIDEMIA 01/16/2007   HYPERTENSION 01/16/2007   Impaired glucose tolerance    LEG EDEMA 11/27/2008   Special screening for  malignant neoplasm of prostate    Warfarin anticoagulation    AVR    Medications:  Medications Prior to Admission  Medication Sig Dispense Refill Last Dose   acetaminophen (TYLENOL) 650 MG CR tablet Take 1,300 mg by mouth 2 (two) times daily.   05/25/2022   albuterol (VENTOLIN HFA) 108 (90 Base) MCG/ACT inhaler INHALE 2 PUFFS INTO THE LUNGS EVERY 6 HOURS AS NEEDED FOR WHEEZING OR SHORTNESS OF BREATH. (Patient taking differently: Inhale 1-2 puffs into the lungs every 6 (six) hours as needed for wheezing or shortness of breath.) 3 each 0 05/25/2022   Ascorbic Acid (VITAMIN C) 1000 MG tablet Take 1,000 mg by mouth daily.   05/25/2022   aspirin 81 MG tablet Take 81 mg by mouth every evening.    05/25/2022   atorvastatin (LIPITOR) 40 MG tablet TAKE 1 TABLET EVERY DAY AT 6 PM. (Patient taking differently: Take 40 mg by mouth daily.) 90 tablet 1 05/24/2022   calcium carbonate (OS-CAL) 600 MG TABS Take 600 mg by mouth 2 (two) times daily with a meal.   05/25/2022   carbidopa-levodopa (SINEMET CR) 50-200 MG tablet Take 1 tablet by mouth at bedtime.   05/24/2022   carbidopa-levodopa (SINEMET IR) 25-100 MG tablet Take 1 tablet by mouth 4 (four) times daily.   05/25/2022   cetirizine (ZYRTEC) 10 MG tablet Take 10 mg by mouth daily.    05/25/2022    cholecalciferol (VITAMIN D) 25 MCG (1000 UNIT) tablet Take 1,000 Units by mouth daily.   05/24/2022   fluticasone (FLONASE) 50 MCG/ACT nasal spray Place 2 sprays into both nostrils daily. (Patient taking differently: Place 2 sprays into both nostrils daily as needed for allergies or rhinitis.) 16 g 6 Unknown   furosemide (LASIX) 40 MG tablet TAKE 1 TABLET EVERY DAY (Patient taking differently: Take 40 mg by mouth daily.) 90 tablet 1 05/25/2022   losartan (COZAAR) 100 MG tablet TAKE 1 TABLET EVERY DAY (PLEASE MAKE OVERDUE APPT WITH DR. Lovena Le BEFORE ANY MORE REFILLS, 2ND ATTEMPT, CALL [phone]) (Patient taking differently: Take 100 mg by mouth daily.) 90 tablet 1 05/24/2022   melatonin 5 MG TABS Take 5 mg by mouth at bedtime.   05/24/2022   metFORMIN (GLUCOPHAGE) 500 MG tablet TAKE 1/2 TABLET EVERY DAY WITH BREAKFAST - SCHEDULE APPT FOR FUTURE REFILLS (Patient taking differently: Take 250 mg by mouth daily.) 45 tablet 0 05/25/2022   Misc Natural Products (TART CHERRY ADVANCED) CAPS Take 2 capsules by mouth daily.    05/25/2022   Multiple Vitamin (MULTIVITAMIN) tablet Take 1 tablet by mouth daily.     05/25/2022   nitroGLYCERIN (NITROSTAT) 0.4 MG SL tablet Place 1 tablet (0.4 mg total) under the tongue every 5 (five) minutes as needed. For chest pain 75 tablet 2 05/25/2022   potassium chloride (KLOR-CON) 10 MEQ tablet TAKE 1 TABLET EVERY DAY (Patient taking differently: Take 10 mEq by mouth once.) 90 tablet 2 05/25/2022   Probiotic Product ([place] PO) Take 1 capsule by mouth daily.   05/25/2022   vitamin B-12 (CYANOCOBALAMIN) 1000 MCG tablet Take 1,000 mcg by mouth daily.   05/25/2022   warfarin (COUMADIN) 10 MG tablet TAKE 1/2 TO 1 TABLET DAILY OR AS DIRECTED BY COUMADIN CLINIC (Patient taking differently: Take 5-10 mg by mouth at bedtime. Take 5 mg (1/2 tablet) by mouth on Sunday's, Tuesday's, and Thursday's. On the other days of the week take 10 mg (1 tablet)) 90 tablet 1 05/24/2022  at [place]  FastClix Lancets MISC USE TO CHECK BLOOD SUGAR TWICE A DAY AND AS NEEDED 306 each 2    Blood Glucose Monitoring Suppl (ACCU-CHEK GUIDE) w/Device KIT Use as directed 1 kit 0    glucose blood (ACCU-CHEK SMARTVIEW) test strip Use as instructed 300 strip 2    metoprolol succinate (TOPROL-XL) 100 MG 24 hr tablet TAKE 1 TABLET EVERY DAY (Patient taking differently: Take 100 mg by mouth daily.) 90 tablet 3  at 0900    Scheduled:   vitamin C  1,000 mg Oral Daily   aspirin  81 mg Oral QPM   atorvastatin  40 mg Oral Daily   calcium carbonate  1 tablet Oral BID WC   carbidopa-levodopa  1 tablet Oral QHS   carbidopa-levodopa  1 tablet Oral QID   cholecalciferol  1,000 Units Oral Daily   cyanocobalamin  1,000 mcg Oral Daily   furosemide  40 mg Oral Daily   insulin aspart  0-15 Units Subcutaneous TID WC   loratadine  10 mg Oral Daily   losartan  100 mg Oral Daily   melatonin  5 mg Oral QHS   metoprolol succinate  100 mg Oral Daily   multivitamin with minerals  1 tablet Oral Daily   potassium chloride  10 mEq Oral Daily   sodium chloride flush  3 mL Intravenous Q12H   Infusions:   sodium chloride     sodium chloride 1 mL/kg/hr (05/27/22 0903)   heparin 1,400 Units/hr (05/27/22 0716)   PRN: sodium chloride, acetaminophen, albuterol, fluticasone, nitroGLYCERIN, ondansetron (ZOFRAN) IV, sodium chloride flush  Assessment: 36 yom with a history of CAD (s/p CABG), aortic stenosis (s/p Bentall aortic root replacement w/ St.Jude mechanical valve), CHB (s/p PPM 2005), T2DM, HTN, HLD, Parkinson disease and GERD. Patient is presenting with chest pain. Heparin per pharmacy consult placed for chest pain/ACS.  Patient is on warfarin prior to arrival. Last dose 11/13. Home dose is 5 mg (10 mg x 0.5) every Sun, Tue, Thu; 10 mg (10 mg x 1) all other days. INR goal per last anticoag visit with cardiology is 2-3.  Heparin level is therapeutic at 0.68, INR down to 1.5, CBC stable. Cardiac cath  planned later.  Goal of Therapy:  Heparin level 0.3-0.7 units/ml Monitor platelets by anticoagulation protocol: Yes   Plan:  Continue heparin 1400 units/h Daily heparin level and CBC  Arrie Senate, PharmD, [place], [facility] Clinical Pharmacist [phone] Please check AMION for all [facility] numbers 05/27/2022

## 2022-05-27 NOTE — Progress Notes (Signed)
Patient started having chest pain when he woke up. He had one Nitroglycerin tablet at 0420 and felt better. Patient stated pain went away. EKG ran and strips saved.

## 2022-05-27 NOTE — H&P (View-Only) (Signed)
Rounding Note    Patient Name: [name] Date of Encounter: 05/27/2022  [place] Cardiologist: Candee Furbish, MD   Subjective   Patient denies chest pain, shortness of breath, palpitations, dizziness.  He is comfortably laying in bed on supine. Patient is scheduled for cardiac cath today.  On heparin IV infusion. Vitals stable.  Inpatient Medications    Scheduled Meds:  vitamin C  1,000 mg Oral Daily   aspirin  81 mg Oral QPM   atorvastatin  40 mg Oral Daily   calcium carbonate  1 tablet Oral BID WC   carbidopa-levodopa  1 tablet Oral QHS   carbidopa-levodopa  1 tablet Oral QID   cholecalciferol  1,000 Units Oral Daily   cyanocobalamin  1,000 mcg Oral Daily   furosemide  40 mg Oral Daily   insulin aspart  0-15 Units Subcutaneous TID WC   loratadine  10 mg Oral Daily   losartan  100 mg Oral Daily   melatonin  5 mg Oral QHS   metoprolol succinate  100 mg Oral Daily   multivitamin with minerals  1 tablet Oral Daily   potassium chloride  10 mEq Oral Daily   sodium chloride flush  3 mL Intravenous Q12H   Continuous Infusions:  sodium chloride     sodium chloride 1 mL/kg/hr (05/27/22 0903)   heparin 1,400 Units/hr (05/27/22 0716)   PRN Meds: sodium chloride, acetaminophen, albuterol, fluticasone, nitroGLYCERIN, ondansetron (ZOFRAN) IV, sodium chloride flush   Vital Signs    Vitals:   05/26/22 1400 05/26/22 1441 05/26/22 2002 05/27/22 0422  BP: 120/66 136/78 128/73 (!) 160/91  Pulse: 70 71 80 99  Resp: '15  19 20  '$ Temp: 98.7 F (37.1 C) 98.5 F (36.9 C) 98.8 F (37.1 C) 98.4 F (36.9 C)  TempSrc: Oral Oral Oral Oral  SpO2: 97% 98% 97% 99%  Weight:      Height:        Intake/Output Summary (Last 24 hours) at 05/27/2022 0945 Last data filed at 05/27/2022 4128 Gross per 24 hour  Intake 187.65 ml  Output 600 ml  Net -412.35 ml      05/26/2022   10:00 AM 05/10/2022    3:46 PM 11/04/2021    9:01 AM  Last 3 Weights  Weight (lbs) 207 lb  207 lb 203 lb  Weight (kg) 93.895 kg 93.895 kg 92.08 kg      Telemetry    A paced rhythm- Personally Reviewed  ECG    No new EKG today  Physical Exam  Patient comfortably laying in bed on supine.  He is communicating with complete sentences.  No acute visible distress. GEN: No acute distress.   Neck: No JVD Cardiac: RRR, no murmurs, rubs, or gallops.  Respiratory: Clear to auscultation bilaterally.  No wheeze, Rales or rhonchi appreciated to auscultation GI: Soft, nontender, non-distended  MS: No lower extremity edema  neuro:  Nonfocal  Psych: Normal affect.  Alert and oriented x3  Labs    High Sensitivity Troponin:   Recent Labs  Lab 05/25/22 1214 05/25/22 1454 05/25/22 1713 05/25/22 1928  TROPONINIHS 143* 156* 133* 103*     Chemistry Recent Labs  Lab 05/25/22 1214 05/25/22 1713 05/26/22 0835  NA 141  --  141  K 4.0  --  4.5  CL 106  --  105  CO2 26  --  30  GLUCOSE 131*  --  122*  BUN 14  --  17  CREATININE 0.84  --  0.79  CALCIUM 9.2  --  9.1  MG  --  2.0  --   PROT 6.0*  --   --   ALBUMIN 3.8  --   --   AST 37  --   --   ALT 10  --   --   ALKPHOS 47  --   --   BILITOT 0.8  --   --   GFRNONAA >60  --  >60  ANIONGAP 9  --  6    Lipids  Recent Labs  Lab 05/26/22 0835  CHOL 149  TRIG 110  HDL 48  LDLCALC 79  CHOLHDL 3.1    Hematology Recent Labs  Lab 05/25/22 1214 05/26/22 0835 05/27/22 0535  WBC 7.8 8.3 8.3  RBC 4.85 4.88 4.72  HGB 14.2 14.6 13.8  HCT 44.6 44.2 42.2  MCV 92.0 90.6 89.4  MCH 29.3 29.9 29.2  MCHC 31.8 33.0 32.7  RDW 14.8 15.0 14.8  PLT 228 230 209   Thyroid  Recent Labs  Lab 05/25/22 1740  TSH 0.716    BNPNo results for input(s): "BNP", "PROBNP" in the last 168 hours.  DDimer No results for input(s): "DDIMER" in the last 168 hours.   Radiology    ECHOCARDIOGRAM COMPLETE  Result Date: 05/26/2022    ECHOCARDIOGRAM REPORT   Patient Name:   [name] [facility] Date of Exam: 05/26/2022 Medical Rec #:  [id number]        Height:       66.0 in Accession #:    [id number]      Weight:       207.0 lb Date of Birth:  06/09/1946      BSA:          2.029 m Patient Age:    11 years        BP:           136/78 mmHg Patient Gender: M               HR:           71 bpm. Exam Location:  Inpatient Procedure: 2D Echo, Cardiac Doppler and Color Doppler Indications:    Chest Pain R07.9  History:        Patient has prior history of Echocardiogram examinations, most                 recent 04/13/2021. Previous Myocardial Infarction and CAD, Prior                 CABG and Pacemaker; Risk Factors:Hypertension, Dyslipidemia and                 Diabetes. Bentall Aortic root replacement and hemishield                 conduit.                  Aortic Valve: mechanical valve is present in the aortic                 position.  Sonographer:    Alvino Chapel RCS Referring Phys: [id number] [place]  1. Left ventricular ejection fraction, by estimation, is 50 to 55%. The left ventricle has low normal function. The left ventricle demonstrates regional wall motion abnormalities (see scoring diagram/findings for description). There is mild left ventricular hypertrophy. Left ventricular diastolic parameters are consistent with Grade I diastolic dysfunction (impaired relaxation).  2. Right ventricular systolic function is normal. The right  ventricular size is normal.  3. The mitral valve is normal in structure. No evidence of mitral valve regurgitation. No evidence of mitral stenosis.  4. The aortic valve has been repaired/replaced. Aortic valve regurgitation is not visualized. No aortic stenosis is present. There is a mechanical valve present in the aortic position. Echo findings are consistent with normal structure and function of the aortic valve prosthesis.  5. Bentall. Aortic root/ascending aorta has been repaired/replaced.  6. The inferior vena cava is normal in size with greater than 50% respiratory variability, suggesting right atrial  pressure of 3 mmHg. Comparison(s): Prior images reviewed side by side. FINDINGS  Left Ventricle: Left ventricular ejection fraction, by estimation, is 50 to 55%. The left ventricle has low normal function. The left ventricle demonstrates regional wall motion abnormalities. The left ventricular internal cavity size was normal in size. There is mild left ventricular hypertrophy. Left ventricular diastolic parameters are consistent with Grade I diastolic dysfunction (impaired relaxation).  LV Wall Scoring: The apical septal segment is akinetic. Right Ventricle: The right ventricular size is normal. No increase in right ventricular wall thickness. Right ventricular systolic function is normal. Left Atrium: Left atrial size was normal in size. Right Atrium: Right atrial size was normal in size. Pericardium: There is no evidence of pericardial effusion. Mitral Valve: The mitral valve is normal in structure. No evidence of mitral valve regurgitation. No evidence of mitral valve stenosis. Tricuspid Valve: The tricuspid valve is normal in structure. Tricuspid valve regurgitation is not demonstrated. No evidence of tricuspid stenosis. Aortic Valve: The aortic valve has been repaired/replaced. Aortic valve regurgitation is not visualized. No aortic stenosis is present. Aortic valve mean gradient measures 6.0 mmHg. Aortic valve peak gradient measures 12.4 mmHg. Aortic valve area, by VTI  measures 1.09 cm. There is a mechanical valve present in the aortic position. Echo findings are consistent with normal structure and function of the aortic valve prosthesis. Pulmonic Valve: The pulmonic valve was normal in structure. Pulmonic valve regurgitation is not visualized. No evidence of pulmonic stenosis. Aorta: Bentall. The aortic root/ascending aorta has been repaired/replaced. Venous: The inferior vena cava is normal in size with greater than 50% respiratory variability, suggesting right atrial pressure of 3 mmHg. IAS/Shunts: No  atrial level shunt detected by color flow Doppler. Additional Comments: A device lead is visualized in the right ventricle.  LEFT VENTRICLE PLAX 2D LVIDd:         5.20 cm LVIDs:         3.60 cm LV PW:         1.30 cm LV IVS:        1.30 cm LVOT diam:     1.80 cm LV SV:         40 LV SV Index:   19 LVOT Area:     2.54 cm  RIGHT VENTRICLE RV S prime:     10.40 cm/s TAPSE (M-mode): 1.8 cm LEFT ATRIUM             Index        RIGHT ATRIUM           Index LA diam:        3.65 cm 1.80 cm/m   RA Area:     24.50 cm LA Vol (A2C):   67.0 ml 33.02 ml/m  RA Volume:   87.40 ml  43.07 ml/m LA Vol (A4C):   54.1 ml 26.66 ml/m LA Biplane Vol: 67.2 ml 33.12 ml/m  AORTIC VALVE AV Area (Vmax):  1.04 cm AV Area (Vmean):   1.13 cm AV Area (VTI):     1.09 cm AV Vmax:           176.00 cm/s AV Vmean:          115.000 cm/s AV VTI:            0.363 m AV Peak Grad:      12.4 mmHg AV Mean Grad:      6.0 mmHg LVOT Vmax:         71.60 cm/s LVOT Vmean:        51.250 cm/s LVOT VTI:          0.156 m LVOT/AV VTI ratio: 0.43  AORTA Ao Root diam: 3.30 cm MITRAL VALVE MV Area (PHT): 2.32 cm     SHUNTS MV Decel Time: 327 msec     Systemic VTI:  0.16 m MV E velocity: 74.40 cm/s   Systemic Diam: 1.80 cm MV A velocity: 102.00 cm/s MV E/A ratio:  0.73 Candee Furbish MD Electronically signed by Candee Furbish MD Signature Date/Time: 05/26/2022/4:40:27 PM    Final    DG Chest 1 View  Result Date: 05/25/2022 CLINICAL DATA:  Chest pain beginning today EXAM: CHEST  1 VIEW COMPARISON:  06/07/2016 FINDINGS: Previous median sternotomy, CABG and mitral valve replacement. Heart size remains normal. Tortuous aorta as seen previously. Dual lead pacemaker in place. The pulmonary vascularity is normal. The lungs are clear. No acute bone finding. IMPRESSION: No active disease. Previous CABG and mitral valve replacement. Dual lead pacemaker. Electronically Signed   By: Nelson Chimes M.D.   On: 05/25/2022 12:44    Cardiac Studies   [facility]  05/26/2022  ECHOCARDIOGRAM REPORT       Patient Name:   [name] [facility] Date of Exam: 05/26/2022  Medical Rec #:  [id number]       Height:       66.0 in  Accession #:    [id number]      Weight:       207.0 lb  Date of Birth:  May 11, 1946      BSA:          2.029 m  Patient Age:    29 years        BP:           136/78 mmHg  Patient Gender: M               HR:           71 bpm.  Exam Location:  Inpatient   Procedure: 2D Echo, Cardiac Doppler and Color Doppler   Indications:    Chest Pain R07.9    History:        Patient has prior history of Echocardiogram examinations,  most                 recent 04/13/2021. Previous Myocardial Infarction and CAD,  Prior                 CABG and Pacemaker; Risk Factors:Hypertension,  Dyslipidemia and                  Diabetes. Bentall Aortic root replacement and hemishield                  conduit.                    Aortic Valve: mechanical valve is present in the aortic  position.    Sonographer:    Alvino Chapel RCS  Referring Phys: [id number] [place]     1. Left ventricular ejection fraction, by estimation, is 50 to 55%. The  left ventricle has low normal function. The left ventricle demonstrates  regional wall motion abnormalities (see scoring diagram/findings for  description). There is mild left  ventricular hypertrophy. Left ventricular diastolic parameters are  consistent with Grade I diastolic dysfunction (impaired relaxation).   2. Right ventricular systolic function is normal. The right ventricular  size is normal.   3. The mitral valve is normal in structure. No evidence of mitral valve  regurgitation. No evidence of mitral stenosis.   4. The aortic valve has been repaired/replaced. Aortic valve  regurgitation is not visualized. No aortic stenosis is present. There is a  mechanical valve present in the aortic position. Echo findings are  consistent with normal structure and function of   the aortic valve prosthesis.   5. Bentall. Aortic root/ascending aorta has been repaired/replaced.   6. The inferior vena cava is normal in size with greater than 50%  respiratory variability, suggesting right atrial pressure of 3 mmHg.   Comparison(s): Prior images reviewed side by side.   FINDINGS   Left Ventricle: Left ventricular ejection fraction, by estimation, is 50  to 55%. The left ventricle has low normal function. The left ventricle  demonstrates regional wall motion abnormalities. The left ventricular  internal cavity size was normal in  size. There is mild left ventricular hypertrophy. Left ventricular  diastolic parameters are consistent with Grade I diastolic dysfunction  (impaired relaxation).     LV Wall Scoring:  The apical septal segment is akinetic.   Right Ventricle: The right ventricular size is normal. No increase in  right ventricular wall thickness. Right ventricular systolic function is  normal.   Left Atrium: Left atrial size was normal in size.   Right Atrium: Right atrial size was normal in size.   Pericardium: There is no evidence of pericardial effusion.   Mitral Valve: The mitral valve is normal in structure. No evidence of  mitral valve regurgitation. No evidence of mitral valve stenosis.   Tricuspid Valve: The tricuspid valve is normal in structure. Tricuspid  valve regurgitation is not demonstrated. No evidence of tricuspid  stenosis.   Aortic Valve: The aortic valve has been repaired/replaced. Aortic valve  regurgitation is not visualized. No aortic stenosis is present. Aortic  valve mean gradient measures 6.0 mmHg. Aortic valve peak gradient measures  12.4 mmHg. Aortic valve area, by VTI   measures 1.09 cm. There is a mechanical valve present in the aortic  position. Echo findings are consistent with normal structure and function  of the aortic valve prosthesis.   Pulmonic Valve: The pulmonic valve was normal in structure. Pulmonic  valve  regurgitation is not visualized. No evidence of pulmonic stenosis.   Aorta: Bentall. The aortic root/ascending aorta has been  repaired/replaced.   Venous: The inferior vena cava is normal in size with greater than 50%  respiratory variability, suggesting right atrial pressure of 3 mmHg.   IAS/Shunts: No atrial level shunt detected by color flow Doppler.   Additional Comments: A device lead is visualized in the right ventricle.     LEFT VENTRICLE  PLAX 2D  LVIDd:         5.20 cm  LVIDs:         3.60 cm  LV PW:  1.30 cm  LV IVS:        1.30 cm  LVOT diam:     1.80 cm  LV SV:         40  LV SV Index:   19  LVOT Area:     2.54 cm     RIGHT VENTRICLE  RV S prime:     10.40 cm/s  TAPSE (M-mode): 1.8 cm   LEFT ATRIUM             Index        RIGHT ATRIUM           Index  LA diam:        3.65 cm 1.80 cm/m   RA Area:     24.50 cm  LA Vol (A2C):   67.0 ml 33.02 ml/m  RA Volume:   87.40 ml  43.07 ml/m  LA Vol (A4C):   54.1 ml 26.66 ml/m  LA Biplane Vol: 67.2 ml 33.12 ml/m   AORTIC VALVE  AV Area (Vmax):    1.04 cm  AV Area (Vmean):   1.13 cm  AV Area (VTI):     1.09 cm  AV Vmax:           176.00 cm/s  AV Vmean:          115.000 cm/s  AV VTI:            0.363 m  AV Peak Grad:      12.4 mmHg  AV Mean Grad:      6.0 mmHg  LVOT Vmax:         71.60 cm/s  LVOT Vmean:        51.250 cm/s  LVOT VTI:          0.156 m  LVOT/AV VTI ratio: 0.43    AORTA  Ao Root diam: 3.30 cm   MITRAL VALVE  MV Area (PHT): 2.32 cm     SHUNTS  MV Decel Time: 327 msec     Systemic VTI:  0.16 m  MV E velocity: 74.40 cm/s   Systemic Diam: 1.80 cm  MV A velocity: 102.00 cm/s  MV E/A ratio:  0.73   Candee Furbish MD  Electronically signed by Candee Furbish MD  Signature Date/Time: 05/26/2022/4:40:27 PM    ECHO 04/13/2021  ECHOCARDIOGRAM REPORT     IMPRESSIONS     1. Distal septal hypokinesis Inferior wall hypokinesis . Left ventricular  ejection fraction, by estimation,  is 45 to 50%. The left ventricle has  mildly decreased function. The left ventricle demonstrates regional wall  motion abnormalities (see scoring diagram/findings for description).      Patient Profile     [name] is a 76 y.o. male with  hx of  CAD (s/p CABG x 2 w/ LIMA-LAD and SVG-Cx in 2005), aortic stenosis (s/p Bentall aortic root replacement w/ St.Jude mechanical valve), on coumadin, complete heart block (s/p PPM 2005), Type 2 DM, HTN, HLD, Parkinson disease and GERD  who is being seen 05/25/2022 for the evaluation of Chest pain.   Stress test 01/2013: low risk  Echo 01/2016: LVEF 55% Echo 04/2020: LVEF 50-55% and grade 1 DD Echo  04/2021: LVEF 45-50%, grade 1 DD, stable mechanical aortic valve    CT angio of chest aorta 05/03/2022: Unchanged fusiform aneurysmal dilation of the distal ascending thoracic aorta measuring up to 4.5 cm.    Assessment & Plan    1) NSTEMI #Chest pain Patient  history  of known CAD s/p CABG  x 2 w/ LIMA-LAD and SVG-Cx in 2005), aortic stenosis (s/p Bentall aortic root replacement w/ St.Jude mechanical valve). He presented with ongoing chest pain and mildly elevated troponin. On evaluation today he denies chest pain, palpitations, shortness of breath, or dizziness. He is currently on IV heparin infusion.  He is hemodynamically stable.  Telemetry reflecting a paced rhythm. INR 1.5 today.Scr 0.79. Patient is scheduled for cardiac cath today. -Continue aspirin 81 mg once daily -Continue metoprolol succinate 100 mg once daily -Continue atorvastatin 40 mg once daily    2)  Aortic stenosis s/p Bentall aortic root replacement w/ St.Jude mechanical valve in 2005 -INR 1.5 today -Echo (05/26/2022); Left ventricular ejection fraction, by estimation, is 50 to 55%.   3) HTN:  -Continue Losartan 100 mg once daily -Continue Metoprolol succinate 100 mg once daily   4) HLD: Lipid panel was repeated.   LDL 79 (05/26/2022),  63 (03/2021).  -Continue Atorvastatin  40 mg once daily    5) DM -SSI     6)  HFmrEF - Echo  04/2021: LVEF 45-50%, grade 1 DD, stable mechanical aortic valve  - Echo (05/26/2022); Left ventricular ejection fraction, by estimation, is 50 to 55% - Continue Losartan 100 mg once daily -Continue Furosemide 40 mg once daily. - Continue Metoprolol Succinate 100 mg once daily - Update echo      For questions or updates, please contact [place] Please consult [email or web address] for contact info under        Signed, Teola Bradley, MD  05/27/2022, 9:45 AM    [id number]  ATTENDING ATTESTATION:  After conducting a review of all available clinical information with the care team, interviewing the patient, and performing a physical exam, I agree with the findings and plan described in this note.   GEN: No acute distress.   HEENT:  MMM, no JVD, no scleral icterus Cardiac: RRR, no murmurs, rubs, or gallops.  Respiratory: Clear to auscultation bilaterally. GI: Soft, nontender, non-distended  MS: No edema; No deformity. Neuro:  Nonfocal  Vasc:  +2 radial pulses  Patient had an episode of chest pain this AM which resolved with NTG.    TTE reassuring.  Cont current medical therapy.  Coronary angiography today.  He has a good left radial pulse.  Lenna Sciara, MD Pager [phone]

## 2022-05-27 NOTE — Interval H&P Note (Signed)
Cath Lab Visit (complete for each Cath Lab visit)  Clinical Evaluation Leading to the Procedure:   ACS: Yes.    Non-ACS:    Anginal Classification: CCS IV  Anti-ischemic medical therapy: Minimal Therapy (1 class of medications)  Non-Invasive Test Results: No non-invasive testing performed  Prior CABG: Previous CABG      History and Physical Interval Note:  05/27/2022 3:08 PM  [name]  has presented today for surgery, with the diagnosis of nstemi.  The various methods of treatment have been discussed with the patient and family. After consideration of risks, benefits and other options for treatment, the patient has consented to  Procedure(s): LEFT HEART CATH AND CORS/GRAFTS ANGIOGRAPHY (N/A) as a surgical intervention.  The patient's history has been reviewed, patient examined, no change in status, stable for surgery.  I have reviewed the patient's chart and labs.  Questions were answered to the patient's satisfaction.     Larae Grooms

## 2022-05-27 NOTE — Progress Notes (Signed)
ANTICOAGULATION CONSULT NOTE Pharmacy Consult for heparin Indication: mechanical valve Brief A/P:  s/p cath--resume heparin  Allergies  Allergen Reactions   Bee Venom Shortness Of Breath and Swelling   Codeine Phosphate Hives, Shortness Of Breath, Itching, Rash and Other (See Comments)    Tolerated multiple doses of IV morphine   Metoprolol Tartrate Other (See Comments)    loss of vision. Can take the XL tablet not regular    Pramipexole Other (See Comments)    Hallucinations     Tramadol     Pt has hallucinations    Ramipril Rash and Cough    Patient Measurements: Heparin dosing weight: 84 kg  Vital Signs: Temp: 98.5 F (36.9 C) (11/16 2036) Temp Source: Oral (11/16 2036) BP: 134/79 (11/16 2036) Pulse Rate: 76 (11/16 2036)  Labs: Recent Labs    05/25/22 1214 05/25/22 1454 05/25/22 1536 05/25/22 1713 05/25/22 1928 05/26/22 0835 05/26/22 1300 05/26/22 1926 05/27/22 0535  HGB 14.2  --   --   --   --  14.6  --   --  13.8  HCT 44.6  --   --   --   --  44.2  --   --  42.2  PLT 228  --   --   --   --  230  --   --  209  LABPROT  --   --    < >  --   --  21.7* 22.1*  --  17.7*  INR  --   --    < >  --   --  1.9* 2.0*  --  1.5*  HEPARINUNFRC  --   --   --   --   --   --   --  0.27* 0.68  CREATININE 0.84  --   --   --   --  0.79  --   --   --   TROPONINIHS 143* 156*  --  133* 103*  --   --   --   --    < > = values in this interval not displayed.     Estimated Creatinine Clearance: 82.9 mL/min (by C-G formula based on SCr of 0.79 mg/dL).  Assessment: 76 yo male with a history of AVR s/p cath and awaiting possible redo CABG for heparin.  TR band now removed.    Goal of Therapy:  Heparin level 0.3-0.7 units/ml Monitor platelets by anticoagulation protocol: Yes   Plan:  Restart heparin 1400 units/hr at 1 am Check heparin level in 8 hours.   Phillis Knack, PharmD, BCPS  05/27/2022

## 2022-05-28 ENCOUNTER — Encounter (HOSPITAL_COMMUNITY)
Admission: EM | Disposition: A | Discharge status: Home / Self Care | Payer: Self-pay | Source: Home / Self Care | Attending: Internal Medicine

## 2022-05-28 ENCOUNTER — Encounter (HOSPITAL_COMMUNITY): Payer: Self-pay | Admitting: Interventional Cardiology

## 2022-05-28 DIAGNOSIS — I2510 Atherosclerotic heart disease of native coronary artery without angina pectoris: Secondary | ICD-10-CM | POA: Diagnosis not present

## 2022-05-28 DIAGNOSIS — I214 Non-ST elevation (NSTEMI) myocardial infarction: Secondary | ICD-10-CM | POA: Diagnosis not present

## 2022-05-28 DIAGNOSIS — I25110 Atherosclerotic heart disease of native coronary artery with unstable angina pectoris: Secondary | ICD-10-CM

## 2022-05-28 HISTORY — PX: CORONARY LITHOTRIPSY: CATH118330

## 2022-05-28 HISTORY — PX: CORONARY STENT INTERVENTION: CATH118234

## 2022-05-28 HISTORY — PX: CORONARY ATHERECTOMY: CATH118238

## 2022-05-28 LAB — CBC
HCT: 39 % (ref 39.0–52.0)
Hemoglobin: 12.7 g/dL — ABNORMAL LOW (ref 13.0–17.0)
MCH: 29.2 pg (ref 26.0–34.0)
MCHC: 32.6 g/dL (ref 30.0–36.0)
MCV: 89.7 fL (ref 80.0–100.0)
Platelets: 187 10*3/uL (ref 150–400)
RBC: 4.35 MIL/uL (ref 4.22–5.81)
RDW: 14.6 % (ref 11.5–15.5)
WBC: 8.4 10*3/uL (ref 4.0–10.5)
nRBC: 0 % (ref 0.0–0.2)

## 2022-05-28 LAB — GLUCOSE, CAPILLARY
Glucose-Capillary: 115 mg/dL — ABNORMAL HIGH (ref 70–99)
Glucose-Capillary: 104 mg/dL — ABNORMAL HIGH (ref 70–99)
Glucose-Capillary: 149 mg/dL — ABNORMAL HIGH (ref 70–99)
Glucose-Capillary: 129 mg/dL — ABNORMAL HIGH (ref 70–99)

## 2022-05-28 LAB — BASIC METABOLIC PANEL
Anion gap: 11 (ref 5–15)
BUN: 14 mg/dL (ref 8–23)
CO2: 21 mmol/L — ABNORMAL LOW (ref 22–32)
Calcium: 9 mg/dL (ref 8.9–10.3)
Chloride: 106 mmol/L (ref 98–111)
Creatinine, Ser: 0.84 mg/dL (ref 0.61–1.24)
GFR, Estimated: >60 mL/min (ref >60)
Glucose, Bld: 98 mg/dL (ref 70–99)
Potassium: 4.4 mmol/L (ref 3.5–5.1)
Sodium: 138 mmol/L (ref 135–145)

## 2022-05-28 LAB — PROTIME-INR
INR: 1.4 — ABNORMAL HIGH (ref 0.8–1.2)
Prothrombin Time: 17.1 seconds — ABNORMAL HIGH (ref 11.4–15.2)

## 2022-05-28 LAB — HEPARIN LEVEL (UNFRACTIONATED): Heparin Unfractionated: 0.35 IU/mL (ref 0.30–0.70)

## 2022-05-28 SURGERY — CORONARY ATHERECTOMY
Anesthesia: LOCAL

## 2022-05-28 MED ORDER — HEPARIN SODIUM (PORCINE) 1000 UNIT/ML IJ SOLN
INTRAMUSCULAR | Status: DC | PRN
  Administered 2022-05-28: 3000 [IU] via INTRAVENOUS
  Administered 2022-05-28: 9000 [IU] via INTRAVENOUS

## 2022-05-28 MED ORDER — LIDOCAINE HCL (PF) 1 % IJ SOLN
INTRAMUSCULAR | Status: DC | PRN
  Administered 2022-05-28: 2 mL

## 2022-05-28 MED ORDER — SODIUM CHLORIDE 0.9% FLUSH: 3.0000 mL | INTRAVENOUS | Status: DC | PRN

## 2022-05-28 MED ORDER — ASPIRIN 81 MG PO CHEW: 81.0000 mg | CHEWABLE_TABLET | ORAL | Status: DC

## 2022-05-28 MED ORDER — CLOPIDOGREL BISULFATE 75 MG PO TABS: 600.0000 mg | ORAL_TABLET | Freq: Once | ORAL | Status: DC

## 2022-05-28 MED ORDER — NITROGLYCERIN 1 MG/10 ML FOR IR/CATH LAB
INTRA_ARTERIAL | Status: DC | PRN
  Administered 2022-05-28 (×2): 200 ug via INTRACORONARY

## 2022-05-28 MED ORDER — IOHEXOL 350 MG/ML SOLN
INTRAVENOUS | Status: DC | PRN
  Administered 2022-05-28: 140 mL

## 2022-05-28 MED ORDER — SODIUM CHLORIDE 0.9 % IV SOLN: 250.0000 mL | INTRAVENOUS | Status: DC | PRN

## 2022-05-28 MED ORDER — MIDAZOLAM HCL 2 MG/2ML IJ SOLN
INTRAMUSCULAR | Status: DC | PRN
  Administered 2022-05-28 (×2): 1 mg via INTRAVENOUS

## 2022-05-28 MED ORDER — HEPARIN (PORCINE) 25000 UT/250ML-% IV SOLN
1400.0000 [IU]/h | INTRAVENOUS | Status: DC
  Administered 2022-05-28: 1400 [IU]/h via INTRAVENOUS
  Filled 2022-05-28: qty 250

## 2022-05-28 MED ORDER — NITROGLYCERIN 1 MG/10 ML FOR IR/CATH LAB
INTRA_ARTERIAL | Status: AC
  Filled 2022-05-28: qty 10

## 2022-05-28 MED ORDER — LIDOCAINE HCL (PF) 1 % IJ SOLN
INTRAMUSCULAR | Status: AC
  Filled 2022-05-28: qty 30

## 2022-05-28 MED ORDER — CLOPIDOGREL BISULFATE 75 MG PO TABS
75.0000 mg | ORAL_TABLET | Freq: Every day | ORAL | Status: DC
  Administered 2022-05-29: 75 mg via ORAL
  Filled 2022-05-28: qty 1

## 2022-05-28 MED ORDER — VERAPAMIL HCL 2.5 MG/ML IV SOLN
INTRAVENOUS | Status: DC | PRN
  Administered 2022-05-28: 10 mL via INTRA_ARTERIAL

## 2022-05-28 MED ORDER — SODIUM CHLORIDE 0.9 % WEIGHT BASED INFUSION
1.0000 mL/kg/h | INTRAVENOUS | Status: AC
  Administered 2022-05-28: 1 mL/kg/h via INTRAVENOUS

## 2022-05-28 MED ORDER — FENTANYL CITRATE (PF) 100 MCG/2ML IJ SOLN
INTRAMUSCULAR | Status: DC | PRN
  Administered 2022-05-28 (×4): 25 ug via INTRAVENOUS

## 2022-05-28 MED ORDER — HEPARIN (PORCINE) IN NACL 1000-0.9 UT/500ML-% IV SOLN
INTRAVENOUS | Status: AC
  Filled 2022-05-28: qty 1000

## 2022-05-28 MED ORDER — FENTANYL CITRATE (PF) 100 MCG/2ML IJ SOLN
INTRAMUSCULAR | Status: AC
  Filled 2022-05-28: qty 2

## 2022-05-28 MED ORDER — VERAPAMIL HCL 2.5 MG/ML IV SOLN
INTRAVENOUS | Status: AC
  Filled 2022-05-28: qty 2

## 2022-05-28 MED ORDER — SODIUM CHLORIDE 0.9% FLUSH: 3.0000 mL | Freq: Two times a day (BID) | INTRAVENOUS | Status: DC

## 2022-05-28 MED ORDER — ASPIRIN 81 MG PO CHEW
81.0000 mg | CHEWABLE_TABLET | Freq: Every day | ORAL | Status: DC
  Administered 2022-05-29: 81 mg via ORAL
  Filled 2022-05-28: qty 1

## 2022-05-28 MED ORDER — HEPARIN (PORCINE) IN NACL 1000-0.9 UT/500ML-% IV SOLN
INTRAVENOUS | Status: DC | PRN
  Administered 2022-05-28 (×2): 500 mL

## 2022-05-28 MED ORDER — HEPARIN SODIUM (PORCINE) 1000 UNIT/ML IJ SOLN
INTRAMUSCULAR | Status: AC
  Filled 2022-05-28: qty 10

## 2022-05-28 MED ORDER — WARFARIN - PHARMACIST DOSING INPATIENT: Freq: Every day | Status: DC

## 2022-05-28 MED ORDER — HYDRALAZINE HCL 20 MG/ML IJ SOLN: 10.0000 mg | INTRAMUSCULAR | Status: AC | PRN

## 2022-05-28 MED ORDER — SODIUM CHLORIDE 0.9 % IV SOLN: INTRAVENOUS | Status: DC

## 2022-05-28 MED ORDER — CLOPIDOGREL BISULFATE 75 MG PO TABS
600.0000 mg | ORAL_TABLET | Freq: Once | ORAL | Status: AC
  Administered 2022-05-28: 600 mg via ORAL
  Filled 2022-05-28: qty 8

## 2022-05-28 MED ORDER — WARFARIN SODIUM 5 MG PO TABS
10.0000 mg | ORAL_TABLET | Freq: Once | ORAL | Status: AC
  Administered 2022-05-28: 10 mg via ORAL
  Filled 2022-05-28: qty 2

## 2022-05-28 MED ORDER — ATORVASTATIN CALCIUM 80 MG PO TABS
80.0000 mg | ORAL_TABLET | Freq: Every day | ORAL | Status: DC
  Administered 2022-05-28 – 2022-05-29 (×2): 80 mg via ORAL
  Filled 2022-05-28 (×2): qty 1

## 2022-05-28 MED ORDER — CLOPIDOGREL BISULFATE 75 MG PO TABS: 75.0000 mg | ORAL_TABLET | Freq: Every day | ORAL | Status: DC

## 2022-05-28 MED ORDER — MIDAZOLAM HCL 2 MG/2ML IJ SOLN
INTRAMUSCULAR | Status: AC
  Filled 2022-05-28: qty 2

## 2022-05-28 SURGICAL SUPPLY — 32 items
BALL SAPPHIRE NC24 3.75X12 (BALLOONS) ×1
BALLN EMERGE MR 2.0X15 (BALLOONS) ×1
BALLN SAPPHIRE 1.0X10 (BALLOONS) ×1
BALLN SCOREFLEX 2.50X15 (BALLOONS) ×1
BALLN ~~LOC~~ EMERGE MR 2.5X15 (BALLOONS) ×1
BALLOON EMERGE MR 2.0X15 (BALLOONS) IMPLANT
BALLOON SAPPHIRE 1.0X10 (BALLOONS) IMPLANT
BALLOON SAPPHIRE NC24 3.75X12 (BALLOONS) IMPLANT
BALLOON SCOREFLEX 2.50X15 (BALLOONS) IMPLANT
BALLOON ~~LOC~~ EMERGE MR 2.5X15 (BALLOONS) IMPLANT
CATH LAUNCHER 6FR EBU3.5 (CATHETERS) IMPLANT
CATH SHOCKWAVE 2.5X12 (CATHETERS) IMPLANT
CATH TELEPORT (CATHETERS) IMPLANT
CATHETER SHOCKWAVE 2.5X12 (CATHETERS) ×1
COVER SWIFTLINK CONNECTOR (BAG) IMPLANT
CROWN DIAMONDBACK CLASSIC 1.25 (BURR) IMPLANT
DEVICE RAD COMP TR BAND LRG (VASCULAR PRODUCTS) IMPLANT
ELECT DEFIB PAD ADLT CADENCE (PAD) IMPLANT
GLIDESHEATH SLEND SS 6F .021 (SHEATH) IMPLANT
GUIDEWIRE INQWIRE 1.5J.035X260 (WIRE) IMPLANT
INQWIRE 1.5J .035X260CM (WIRE) ×1
KIT ENCORE 26 ADVANTAGE (KITS) IMPLANT
KIT HEART LEFT (KITS) ×1 IMPLANT
PACK CARDIAC CATHETERIZATION (CUSTOM PROCEDURE TRAY) ×1 IMPLANT
SHEATH PROBE COVER 6X72 (BAG) IMPLANT
STENT ONYX FRONTIER 2.5X30 (Permanent Stent) IMPLANT
STENT ONYX FRONTIER 3.5X18 (Permanent Stent) IMPLANT
TRANSDUCER W/STOPCOCK (MISCELLANEOUS) ×1 IMPLANT
TUBING CIL FLEX 10 FLL-RA (TUBING) ×1 IMPLANT
WIRE ASAHI PROWATER 180CM (WIRE) IMPLANT
WIRE ASAHI PROWATER 300CM (WIRE) IMPLANT
WIRE VIPERWIRE COR FLEX .012 (WIRE) IMPLANT

## 2022-05-28 NOTE — Consult Note (Signed)
[address]       ,[place] [address]             [phone]           [name] [name] Medical Record #[id number] Date of Birth: [birth year]  No ref. provider found Jeremy Peng, NP  Chief Complaint:    Chief Complaint  Patient presents with   Chest Pain    History of Present Illness:     Pt sp Bental with mechanical valve and CABGx2 (LIMA to LAD, SVG to OM) presented with rest angina. Cath with severe OM disease with occluded SVG to OM and with patent LIMA. Pt also with significant Parkinsons disease. Have been asked to review surgical options for pt. Pt comfortable in bed but asking about discomfort in neck and back currently      Past Medical History:  Diagnosis Date   Acute renal failure ([place]) 02/09/2013   Aortic stenosis 08/25/2010   a. Bentall aortic root replacement with a St. Jude mechanical valve and Hemashield conduit 02/2004.   Arthritis    Asthma    AV BLOCK, COMPLETE    a. s/p St Jude dual chamber pacemaker 02/2004.   CAD (coronary artery disease)    a. s/p CABGx2 (LIMA-dLAD, SVG-Cx). b. Low risk nuc 12/2012 without ischemia, EF 46% mild apical hypokinesia (EF 55% inf HK by echo).   Carotid artery disease (HCC)    a. 0-39% bilateral ICA stenosis, stable mild hard plaque in carotid bulbs. F/u 03/2014 recommended.   Diabetes mellitus without complication ([place])    type 2    DIVERTICULOSIS, COLON 10/17/2007   Ejection fraction    a. EF 55% with inf HK, mild MR by echo 12/2012.   GERD (gastroesophageal reflux disease)    hxof    GOUT 01/16/2007   Headache    hx of migraines    HEMORRHOIDS, INTERNAL 11/01/2008   Hx of adenomatous polyp of colon 02/2019   no recall needed   HYPERLIPIDEMIA 01/16/2007   HYPERTENSION 01/16/2007   Impaired glucose tolerance    LEG EDEMA 11/27/2008   Special screening for malignant neoplasm of prostate    Warfarin anticoagulation    AVR    Past Surgical History:  Procedure Laterality Date   AORTIC  VALVE REPLACEMENT     CARDIAC CATHETERIZATION  06/2003   CIRCUMCISION     COLONOSCOPY     CORONARY ARTERY BYPASS GRAFT     coronary stents      prior to bypass    EP IMPLANTABLE DEVICE N/A 05/06/2016   Procedure: [place];  Surgeon: Jeremy Lance, MD;  Location: [facility];  Service: Cardiovascular;  Laterality: N/A;   ESOPHAGOGASTRODUODENOSCOPY     HERNIA REPAIR     INGUINAL HERNIA REPAIR Bilateral 02/07/2013   Procedure: LAPAROSCOPIC BILATERAL INGUINAL HERNIA REPAIR;  Surgeon: Jeremy Burn. Georgette Dover, MD;  Location: [facility];  Service: General;  Laterality: Bilateral;   INSERTION OF MESH Bilateral 02/07/2013   Procedure: INSERTION OF MESH;  Surgeon: Jeremy Burn. Georgette Dover, MD;  Location: [facility];  Service: General;  Laterality: Bilateral;   LEFT HEART CATH AND CORS/GRAFTS ANGIOGRAPHY N/A 05/27/2022   Procedure: LEFT HEART CATH AND CORS/GRAFTS ANGIOGRAPHY;  Surgeon: Jeremy Booze, MD;  Location: [facility];  Service: Cardiovascular;  Laterality: N/A;   LUMBAR LAMINECTOMY/DECOMPRESSION MICRODISCECTOMY N/A 03/15/2018   Procedure: Complete decompressive lumbar laminectomy for spinal stenosis of L4-L5 and foraminotomy for L4-L5  root on right;  Surgeon: Jeremy Maudlin, MD;  Location: [facility];  Service: Orthopedics;  Laterality: N/A;   PACEMAKER INSERTION     SHOULDER SURGERY Bilateral    SKIN BIOPSY  05/04/2019   BASAL CELL CARCINOMA//MID CENTRAL NASAL   SKIN BIOPSY  10/11/2019   Superficial basal cell carcinoma - left superior central forehead shave    Social History   Tobacco Use  Smoking Status Former   Types: Cigarettes   Quit date: 07/12/1973   Years since quitting: 48.9  Smokeless Tobacco Never    Social History   Substance and Sexual Activity  Alcohol Use No    Social History   Socioeconomic History   Marital status: Married    Spouse name: Not on file   Number of children: 3   Years of education: Not on file   Highest education level: Not on file   Occupational History   Occupation: retired  Tobacco Use   Smoking status: Former    Types: Cigarettes    Quit date: 07/12/1973    Years since quitting: 48.9   Smokeless tobacco: Never  Vaping Use   Vaping Use: Never used  Substance and Sexual Activity   Alcohol use: No   Drug use: No   Sexual activity: Not on file  Other Topics Concern   Not on file  Social History Narrative   The patient is married and retired   He reports 3 grown children   Former smoker, no alcohol or tobacco or drug use at this time   Social Determinants of Radio broadcast assistant Strain: Low Risk  (06/16/2021)   Overall Financial Resource Strain (CARDIA)    Difficulty of Paying Living Expenses: Not hard at all  Food Insecurity: No Food Insecurity (05/26/2022)   Hunger Vital Sign    Worried About Running Out of Food in the Last Year: Never true    Ran Out of Food in the Last Year: Never true  Transportation Needs: No Transportation Needs (05/26/2022)   PRAPARE - Hydrologist (Medical): No    Lack of Transportation (Non-Medical): No  Physical Activity: Unknown (09/15/2021)   Exercise Vital Sign    Days of Exercise per Week: Patient refused    Minutes of Exercise per Session: 60 min  Stress: No Stress Concern Present (09/15/2021)   [place]    Feeling of Stress : Not at all  Social Connections: [place] (09/15/2021)   Social Connection and Isolation Panel [NHANES]    Frequency of Communication with Friends and Family: More than three times a week    Frequency of Social Gatherings with Friends and Family: More than three times a week    Attends Religious Services: More than 4 times per year    Active Member of [employer] or Organizations: No    Attends Music therapist: More than 4 times per year    Marital Status: Married  Human resources officer Violence: Not At Risk (05/26/2022)   Humiliation,  Afraid, Rape, and Kick questionnaire    Fear of Current or Ex-Partner: No    Emotionally Abused: No    Physically Abused: No    Sexually Abused: No    Allergies  Allergen Reactions   Bee Venom Shortness Of Breath and Swelling   Codeine Phosphate Hives, Shortness Of Breath, Itching, Rash and Other (See Comments)    Tolerated multiple doses of IV morphine   Metoprolol Tartrate  Other (See Comments)    loss of vision. Can take the XL tablet not regular    Pramipexole Other (See Comments)    Hallucinations     Tramadol     Pt has hallucinations    Ramipril Rash and Cough    Current Facility-Administered Medications  Medication Dose Route Frequency Provider Last Rate Last Admin   0.9 %  sodium chloride infusion  250 mL Intravenous PRN Jeremy Booze, MD       acetaminophen (TYLENOL) tablet 650 mg  650 mg Oral Q4H PRN Jeremy Booze, MD   650 mg at 05/28/22 0637   albuterol (VENTOLIN HFA) 108 (90 Base) MCG/ACT inhaler 2 puff  2 puff Inhalation Q6H PRN Jeremy Booze, MD       ascorbic acid (VITAMIN C) tablet 1,000 mg  1,000 mg Oral Daily Larae Grooms S, MD   1,000 mg at 05/26/22 1252   aspirin chewable tablet 81 mg  81 mg Oral QPM Jeremy Booze, MD   81 mg at 05/26/22 1646   atorvastatin (LIPITOR) tablet 40 mg  40 mg Oral Daily Jeremy Booze, MD   40 mg at 05/26/22 1228   calcium carbonate (OS-CAL - dosed in mg of elemental calcium) tablet 1,250 mg  1 tablet Oral BID WC Jeremy Booze, MD   1,250 mg at 05/26/22 1644   carbidopa-levodopa (SINEMET CR) 50-200 MG per tablet controlled release 1 tablet  1 tablet Oral QHS Jeremy Booze, MD   1 tablet at 05/27/22 2110   carbidopa-levodopa (SINEMET IR) 25-100 MG per tablet immediate release 1 tablet  1 tablet Oral QID Jeremy Booze, MD   1 tablet at 05/27/22 1257   cholecalciferol (VITAMIN D3) 25 MCG (1000 UNIT) tablet 1,000 Units  1,000 Units Oral Daily Jeremy Booze, MD   1,000  Units at 05/26/22 1242   clopidogrel (PLAVIX) tablet 600 mg  600 mg Oral Once Multani, Bhupinder, MD       [START ON 05/29/2022] clopidogrel (PLAVIX) tablet 75 mg  75 mg Oral Daily Multani, Bhupinder, MD       cyanocobalamin (VITAMIN B12) tablet 1,000 mcg  1,000 mcg Oral Daily Larae Grooms S, MD   1,000 mcg at 05/26/22 1228   fluticasone (FLONASE) 50 MCG/ACT nasal spray 2 spray  2 spray Each Nare Daily PRN Jeremy Booze, MD       furosemide (LASIX) tablet 40 mg  40 mg Oral Daily Jeremy Booze, MD   40 mg at 05/26/22 1230   heparin ADULT infusion 100 units/mL (25000 units/275m)  1,400 Units/hr Intravenous Continuous TEarly Osmond MD 14 mL/hr at 05/28/22 0020 1,400 Units/hr at 05/28/22 0020   insulin aspart (novoLOG) injection 0-15 Units  0-15 Units Subcutaneous TID WC VJettie Booze MD       loratadine (CLARITIN) tablet 10 mg  10 mg Oral Daily VJettie Booze MD   10 mg at 05/26/22 1227   losartan (COZAAR) tablet 100 mg  100 mg Oral Daily VJettie Booze MD   100 mg at 05/26/22 1227   melatonin tablet 5 mg  5 mg Oral QHS VJettie Booze MD   5 mg at 05/27/22 2110   metoprolol succinate (TOPROL-XL) 24 hr tablet 100 mg  100 mg Oral Daily VJettie Booze MD   100 mg at 05/27/22 01914  multivitamin with minerals tablet 1 tablet  1 tablet Oral Daily VJettie Booze MD   1  tablet at 05/26/22 1228   nitroGLYCERIN (NITROSTAT) SL tablet 0.4 mg  0.4 mg Sublingual Q5 Min x 3 PRN Jeremy Booze, MD   0.4 mg at 05/28/22 0558   ondansetron (ZOFRAN) injection 4 mg  4 mg Intravenous Q6H PRN Jeremy Booze, MD       potassium chloride (KLOR-CON M) CR tablet 10 mEq  10 mEq Oral Daily Jeremy Booze, MD   10 mEq at 05/27/22 8588   sodium chloride flush (NS) 0.9 % injection 3 mL  3 mL Intravenous Q12H Jeremy Booze, MD   3 mL at 05/27/22 2111   sodium chloride flush (NS) 0.9 % injection 3 mL  3 mL Intravenous PRN Jeremy Booze,  MD         Family History  Problem Relation Age of Onset   Heart attack Father    Kidney cancer Father    Hypertension Mother    Diabetes Mother    Diabetes Brother    Epilepsy Sister    Diabetes Sister    Diabetes Brother    Colon cancer Neg Hx    Rectal cancer Neg Hx    Stomach cancer Neg Hx    Esophageal cancer Neg Hx        Physical Exam: BP (!) 104/57 (BP Location: Left Arm)   Pulse 71   Temp 98 F (36.7 C) (Oral)   Resp 15   Ht '5\' 6"'$  (1.676 m)   Wt 93.9 kg   SpO2 95%   BMI 33.41 kg/m  Comfortable in bed Has distant affect with significant tremulousness Card: RR    Diagnostic Studies & Laboratory data: I have personally reviewed the following studies and agree with the findings     Recent Radiology Findings:   CARDIAC CATHETERIZATION  Addendum Date: 05/27/2022     Mid RCA lesion is 25% stenosed.   Mid LM to Ost LAD lesion is 75% stenosed.  Mid LAD lesion is 100% stenosed.  Prox LAD to Mid LAD lesion is 90% stenosed.  Patent LIMA to LAD.   Prox Cx lesion is 90% stenosed.   2nd Mrg-1 lesion is 95% stenosed.   Mid Cx lesion is 25% stenosed.   SVG to OM appears occluded.   2nd Mrg-2 lesion is 50% stenosed.   Mechanical aortic valve.   Calcified left radial artery noted on ultrasound. Severe native two-vessel coronary artery disease including the LAD and circumflex.  Patent LIMA to LAD.  Occluded SVG to OM.  Very complex distal left main, prox circ and prox OM calcified lesion involving several bifurcations.  Plan for surgical consult to evaluate for redo CABG.   Result Date: 05/27/2022   Mid RCA lesion is 25% stenosed.   Mid LM to Ost LAD lesion is 75% stenosed.  Mid LAD lesion is 100% stenosed.  Prox LAD to Mid LAD lesion is 90% stenosed.  Patent LIMA to LAD.   Prox Cx lesion is 90% stenosed.   2nd Mrg lesion is 95% stenosed.   Mid Cx lesion is 25% stenosed.   SVG to OM appears occluded.   Mechanical aortic valve. Severe native two-vessel coronary artery disease  including the LAD and circumflex.  Patent LIMA to LAD.  Occluded SVG to OM.  Very complex distal left main, prox circ and prox OM calcified lesion involving several bifurcations.  Plan for surgical consult to evaluate for redo CABG.   ECHOCARDIOGRAM COMPLETE  Result Date: 05/26/2022    ECHOCARDIOGRAM REPORT  Patient Name:   [name] [facility] Date of Exam: 05/26/2022 Medical Rec #:  [id number]       Height:       66.0 in Accession #:    [id number]      Weight:       207.0 lb Date of Birth:  06-30-1946      BSA:          2.029 m Patient Age:    41 years        BP:           136/78 mmHg Patient Gender: M               HR:           71 bpm. Exam Location:  Inpatient Procedure: 2D Echo, Cardiac Doppler and Color Doppler Indications:    Chest Pain R07.9  History:        Patient has prior history of Echocardiogram examinations, most                 recent 04/13/2021. Previous Myocardial Infarction and CAD, Prior                 CABG and Pacemaker; Risk Factors:Hypertension, Dyslipidemia and                 Diabetes. Bentall Aortic root replacement and hemishield                 conduit.                  Aortic Valve: mechanical valve is present in the aortic                 position.  Sonographer:    Alvino Chapel RCS Referring Phys: [id number] [place]  1. Left ventricular ejection fraction, by estimation, is 50 to 55%. The left ventricle has low normal function. The left ventricle demonstrates regional wall motion abnormalities (see scoring diagram/findings for description). There is mild left ventricular hypertrophy. Left ventricular diastolic parameters are consistent with Grade I diastolic dysfunction (impaired relaxation).  2. Right ventricular systolic function is normal. The right ventricular size is normal.  3. The mitral valve is normal in structure. No evidence of mitral valve regurgitation. No evidence of mitral stenosis.  4. The aortic valve has been repaired/replaced. Aortic valve  regurgitation is not visualized. No aortic stenosis is present. There is a mechanical valve present in the aortic position. Echo findings are consistent with normal structure and function of the aortic valve prosthesis.  5. Bentall. Aortic root/ascending aorta has been repaired/replaced.  6. The inferior vena cava is normal in size with greater than 50% respiratory variability, suggesting right atrial pressure of 3 mmHg. Comparison(s): Prior images reviewed side by side. FINDINGS  Left Ventricle: Left ventricular ejection fraction, by estimation, is 50 to 55%. The left ventricle has low normal function. The left ventricle demonstrates regional wall motion abnormalities. The left ventricular internal cavity size was normal in size. There is mild left ventricular hypertrophy. Left ventricular diastolic parameters are consistent with Grade I diastolic dysfunction (impaired relaxation).  LV Wall Scoring: The apical septal segment is akinetic. Right Ventricle: The right ventricular size is normal. No increase in right ventricular wall thickness. Right ventricular systolic function is normal. Left Atrium: Left atrial size was normal in size. Right Atrium: Right atrial size was normal in size. Pericardium: There is no evidence of pericardial effusion. Mitral Valve: The mitral valve is normal  in structure. No evidence of mitral valve regurgitation. No evidence of mitral valve stenosis. Tricuspid Valve: The tricuspid valve is normal in structure. Tricuspid valve regurgitation is not demonstrated. No evidence of tricuspid stenosis. Aortic Valve: The aortic valve has been repaired/replaced. Aortic valve regurgitation is not visualized. No aortic stenosis is present. Aortic valve mean gradient measures 6.0 mmHg. Aortic valve peak gradient measures 12.4 mmHg. Aortic valve area, by VTI  measures 1.09 cm. There is a mechanical valve present in the aortic position. Echo findings are consistent with normal structure and function of  the aortic valve prosthesis. Pulmonic Valve: The pulmonic valve was normal in structure. Pulmonic valve regurgitation is not visualized. No evidence of pulmonic stenosis. Aorta: Bentall. The aortic root/ascending aorta has been repaired/replaced. Venous: The inferior vena cava is normal in size with greater than 50% respiratory variability, suggesting right atrial pressure of 3 mmHg. IAS/Shunts: No atrial level shunt detected by color flow Doppler. Additional Comments: A device lead is visualized in the right ventricle.  LEFT VENTRICLE PLAX 2D LVIDd:         5.20 cm LVIDs:         3.60 cm LV PW:         1.30 cm LV IVS:        1.30 cm LVOT diam:     1.80 cm LV SV:         40 LV SV Index:   19 LVOT Area:     2.54 cm  RIGHT VENTRICLE RV S prime:     10.40 cm/s TAPSE (M-mode): 1.8 cm LEFT ATRIUM             Index        RIGHT ATRIUM           Index LA diam:        3.65 cm 1.80 cm/m   RA Area:     24.50 cm LA Vol (A2C):   67.0 ml 33.02 ml/m  RA Volume:   87.40 ml  43.07 ml/m LA Vol (A4C):   54.1 ml 26.66 ml/m LA Biplane Vol: 67.2 ml 33.12 ml/m  AORTIC VALVE AV Area (Vmax):    1.04 cm AV Area (Vmean):   1.13 cm AV Area (VTI):     1.09 cm AV Vmax:           176.00 cm/s AV Vmean:          115.000 cm/s AV VTI:            0.363 m AV Peak Grad:      12.4 mmHg AV Mean Grad:      6.0 mmHg LVOT Vmax:         71.60 cm/s LVOT Vmean:        51.250 cm/s LVOT VTI:          0.156 m LVOT/AV VTI ratio: 0.43  AORTA Ao Root diam: 3.30 cm MITRAL VALVE MV Area (PHT): 2.32 cm     SHUNTS MV Decel Time: 327 msec     Systemic VTI:  0.16 m MV E velocity: 74.40 cm/s   Systemic Diam: 1.80 cm MV A velocity: 102.00 cm/s MV E/A ratio:  0.73 Candee Furbish MD Electronically signed by Candee Furbish MD Signature Date/Time: 05/26/2022/4:40:27 PM    Final       Recent Lab Findings: Lab Results  Component Value Date   WBC 8.4 05/28/2022   HGB 12.7 (L) 05/28/2022   HCT 39.0 05/28/2022   PLT 187  05/28/2022   GLUCOSE 122 (H) 05/26/2022   CHOL  149 05/26/2022   TRIG 110 05/26/2022   HDL 48 05/26/2022   LDLCALC 79 05/26/2022   ALT 10 05/25/2022   AST 37 05/25/2022   NA 141 05/26/2022   K 4.5 05/26/2022   CL 105 05/26/2022   CREATININE 0.79 05/26/2022   BUN 17 05/26/2022   CO2 30 05/26/2022   TSH 0.716 05/25/2022   INR 1.4 (H) 05/28/2022   HGBA1C 6.4 (H) 05/25/2022      Assessment / Plan:    Significant Rest angina with severe CAD sp LIMA to LAD, occluded SVG to OM. Mechanical AVR functioning well  Discussed issues at Cardiac conference this am. I believe the OM2 and Distal OM graftable vessels however with patent LIMA, all possible PCI options would be better pathway for his CAD secondary to high risk reoperative surgery would present with risk of losing LIMA and the fact that his age and parkinsons disease would make his recovery a difficult path.     I have spent 60 min in review of the records, viewing studies and in face to face with patient and in coordination of future care    Coralie Common 05/28/2022 8:46 AM

## 2022-05-28 NOTE — Progress Notes (Addendum)
[place] for warfarin and heparin Indication: St. Jude Mechanical valve  Allergies  Allergen Reactions   Bee Venom Shortness Of Breath and Swelling   Codeine Phosphate Hives, Shortness Of Breath, Itching, Rash and Other (See Comments)    Tolerated multiple doses of IV morphine   Metoprolol Tartrate Other (See Comments)    loss of vision. Can take the XL tablet not regular    Pramipexole Other (See Comments)    Hallucinations     Tramadol     Pt has hallucinations    Ramipril Rash and Cough    Patient Measurements: Heparin dosing weight: 84 kg  Vital Signs: Temp: 98 F (36.7 C) (11/17 0748) Temp Source: Oral (11/17 0748) BP: 104/57 (11/17 0748) Pulse Rate: 71 (11/17 0748)  Labs: Recent Labs    05/25/22 1214 05/25/22 1454 05/25/22 1536 05/25/22 1713 05/25/22 1928 05/26/22 0835 05/26/22 1300 05/26/22 1926 05/27/22 0535 05/28/22 0258  HGB 14.2  --   --   --   --  14.6  --   --  13.8 12.7*  HCT 44.6  --   --   --   --  44.2  --   --  42.2 39.0  PLT 228  --   --   --   --  230  --   --  209 187  LABPROT  --   --    < >  --   --  21.7* 22.1*  --  17.7* 17.1*  INR  --   --    < >  --   --  1.9* 2.0*  --  1.5* 1.4*  HEPARINUNFRC  --   --   --   --   --   --   --  0.27* 0.68  --   CREATININE 0.84  --   --   --   --  0.79  --   --   --   --   TROPONINIHS 143* 156*  --  133* 103*  --   --   --   --   --    < > = values in this interval not displayed.     Estimated Creatinine Clearance: 82.9 mL/min (by C-G formula based on SCr of 0.79 mg/dL).   Medical History: Past Medical History:  Diagnosis Date   Acute renal failure ([place]) 02/09/2013   Aortic stenosis 08/25/2010   a. Bentall aortic root replacement with a St. Jude mechanical valve and Hemashield conduit 02/2004.   Arthritis    Asthma    AV BLOCK, COMPLETE    a. s/p St Jude dual chamber pacemaker 02/2004.   CAD (coronary artery disease)    a. s/p CABGx2 (LIMA-dLAD, SVG-Cx). b.  Low risk nuc 12/2012 without ischemia, EF 46% mild apical hypokinesia (EF 55% inf HK by echo).   Carotid artery disease (HCC)    a. 0-39% bilateral ICA stenosis, stable mild hard plaque in carotid bulbs. F/u 03/2014 recommended.   Diabetes mellitus without complication ([place])    type 2    DIVERTICULOSIS, COLON 10/17/2007   Ejection fraction    a. EF 55% with inf HK, mild MR by echo 12/2012.   GERD (gastroesophageal reflux disease)    hxof    GOUT 01/16/2007   Headache    hx of migraines    HEMORRHOIDS, INTERNAL 11/01/2008   Hx of adenomatous polyp of colon 02/2019   no recall needed   HYPERLIPIDEMIA 01/16/2007  HYPERTENSION 01/16/2007   Impaired glucose tolerance    LEG EDEMA 11/27/2008   Special screening for malignant neoplasm of prostate    Warfarin anticoagulation    AVR    Assessment: 31 yom with a history of CAD (s/p CABG), aortic stenosis (s/p Bentall aortic root replacement w/ St.Jude mechanical valve), CHB (s/p PPM 2005), T2DM, HTN, HLD, Parkinson disease and GERD. Patient is presenting with chest pain. Heparin per pharmacy consult placed for chest pain/ACS.  Patient is on warfarin prior to arrival. Last dose 11/13. Home dose is 5 mg (10 mg x 0.5) every Sun, Tue, Thu; 10 mg (10 mg x 1) all other days. INR goal per last anticoag visit with cardiology is 2-3.  Heparin level was therapeutic at 0.35 prior to cath. INR down to 1.4, hemoglobin trending down but no bleeding issues noted. 3  Patient now s/p PCI. Plan per discussion with cardiology is to restart heparin and warfarin tonight. Continue aspirin for one month at discharge with long term plan for just warfarin and plavix.   Goal of Therapy:  INR goal 2-3 per outpatient records Heparin level 0.3-0.7 units/ml Monitor platelets by anticoagulation protocol: Yes   Plan:  Resume heparin at 1400 units/hr this evening Warfarin 10 mg tonight Daily heparin level, INR, and CBC  Erin Hearing PharmD., BCPS Clinical  Pharmacist 05/28/2022 9:55 AM

## 2022-05-28 NOTE — Progress Notes (Signed)
  Went to bedside to see patient after being notified that he was have chest pain. Per RN, patient reported sharp stabbing 7/10 chest pain all over. EKG showed a ventricular paced rhythm with no significant changes compared to prior tracings. He was given a dose of sublingual Nitro with improvement in pain. When I got to bedside, chest pain had completely resolved. Patient is a difficult historian. He did state this was similar to the pain that brought him in but when asked to point to where he was having pain, he pointed to more epigastric area. Family had bedside stated he also complained that this radiated to his back. After Nitro was given, he reported pain in his left hip/ back. He was given Tylenol for this and that is helping. Hip pain has not completely resolved. He has no bruising or signs of injury. No adventitious heart or lung sounds on exam.   Patient underwent LHC which showed severe native CAD with patent LIMA to LAD but occluded SVG to OM. He had very complex distal left main, proximal LCX, and proximal OM lesion involving several bifurcations. Therefore, plan is for surgical consult for consideration of redo CABG. BP is soft after sublingual Nitro so will hold off on starting patient on Nitro drip. Advised patient to let us know if he has any recurrent chest pain.  Of note, patient does have a known thoracic aortic aneurysm. Last chest CTA on 05/03/2022 showed unchanged fusiform aneurysmal dilation of the distal ascending aorta measuring up to 4.5 cm. Given reports of sharp chest pain radiating to back and the fact that patient is a poor historian, will discuss with rounding MD about whether any repeat imaging is warranted.  Darreld Mclean, PA-C 05/28/2022 7:45 AM

## 2022-05-28 NOTE — Interval H&P Note (Signed)
History and Physical Interval Note:  05/28/2022 10:55 AM  [name]  has presented today for surgery, with the diagnosis of CAD.  The various methods of treatment have been discussed with the patient and family. After consideration of risks, benefits and other options for treatment, the patient has consented to  Procedure(s): CORONARY ATHERECTOMY (N/A) as a surgical intervention.  The patient's history has been reviewed, patient examined, no change in status, stable for surgery.  I have reviewed the patient's chart and labs.  Questions were answered to the patient's satisfaction.   Cath Lab Visit (complete for each Cath Lab visit)  Clinical Evaluation Leading to the Procedure:   ACS: Yes.    Non-ACS:    Anginal Classification: CCS IV  Anti-ischemic medical therapy: Maximal Therapy (2 or more classes of medications)  Non-Invasive Test Results: No non-invasive testing performed  Prior CABG: Previous CABG        [name] [facility] 05/28/2022 10:55 AM

## 2022-05-28 NOTE — Progress Notes (Addendum)
Pt c/o  stabbing sharp cp 7/10" all over his chest" this morning 3/10. Nitro given with new pain to his left hip and back. No SOB reported, no diaphoresis noted. Tylenol given with applesauce per request. EKG done and placed in chart. MD notified

## 2022-05-28 NOTE — H&P (View-Only) (Signed)
Rounding Note    Patient Name: [name] Date of Encounter: 05/28/2022  [place] Cardiologist: Candee Furbish, MD   Subjective   Pt denies chest pain, palpitations, dizziness, or lightheadedness. He is comfortably laying in bed, no acute visible distress.  Pt having recurrent chest pain responsive to SL Nitro. Pt had another episode of chest pain this morning which improved with SL Nitro x 1. His BP is soft with reading of 108/57, SpO2 97% on RA. Telemetry reflects V paced rhythm. IV heparin infusion ongoing.  Family at bedside  Inpatient Medications    Scheduled Meds:  vitamin C  1,000 mg Oral Daily   aspirin  81 mg Oral QPM   atorvastatin  40 mg Oral Daily   calcium carbonate  1 tablet Oral BID WC   carbidopa-levodopa  1 tablet Oral QHS   carbidopa-levodopa  1 tablet Oral QID   cholecalciferol  1,000 Units Oral Daily   clopidogrel  600 mg Oral Once   [START ON 05/29/2022] clopidogrel  75 mg Oral Daily   cyanocobalamin  1,000 mcg Oral Daily   furosemide  40 mg Oral Daily   insulin aspart  0-15 Units Subcutaneous TID WC   loratadine  10 mg Oral Daily   losartan  100 mg Oral Daily   melatonin  5 mg Oral QHS   metoprolol succinate  100 mg Oral Daily   multivitamin with minerals  1 tablet Oral Daily   potassium chloride  10 mEq Oral Daily   sodium chloride flush  3 mL Intravenous Q12H   Continuous Infusions:  sodium chloride     heparin 1,400 Units/hr (05/28/22 0020)   PRN Meds: sodium chloride, acetaminophen, albuterol, fluticasone, nitroGLYCERIN, ondansetron (ZOFRAN) IV, sodium chloride flush   Vital Signs    Vitals:   05/28/22 0021 05/28/22 0507 05/28/22 0600 05/28/22 0748  BP: (!) 142/79 110/61 (!) 141/93 (!) 104/57  Pulse: 69 69  71  Resp: '18 16  15  '$ Temp: 98.2 F (36.8 C) 98.3 F (36.8 C)  98 F (36.7 C)  TempSrc: Oral Oral  Oral  SpO2: 97% 95%  95%  Weight:      Height:        Intake/Output Summary (Last 24 hours) at 05/28/2022  0857 Last data filed at 05/28/2022 0500 Gross per 24 hour  Intake 1102.23 ml  Output 635 ml  Net 467.23 ml      05/26/2022   10:00 AM 05/10/2022    3:46 PM 11/04/2021    9:01 AM  Last 3 Weights  Weight (lbs) 207 lb 207 lb 203 lb  Weight (kg) 93.895 kg 93.895 kg 92.08 kg      Telemetry    V Paced rhythm- Personally Reviewed  ECG    EKG repeated this morning also demonstrated V paced rhythm- Personally Reviewed  Physical Exam  Pt laying in bed on supine. Denies Chest pain. Comfortably communicating without acute visible distress.  GEN: No acute distress.   Neck: No JVD Cardiac: RRR, no murmurs, rubs, or gallops.  Respiratory: Clear to auscultation bilaterally.No wheezing, rales or rhonchi appreciated. GI: Soft, nontender, non-distended  MS: No lower extremities edema. Neuro:  Nonfocal  Psych: Normal affect. A&O x3.   Labs    High Sensitivity Troponin:   Recent Labs  Lab 05/25/22 1214 05/25/22 1454 05/25/22 1713 05/25/22 1928  TROPONINIHS 143* 156* 133* 103*     Chemistry Recent Labs  Lab 05/25/22 1214 05/25/22 1713 05/26/22 0835  NA  141  --  141  K 4.0  --  4.5  CL 106  --  105  CO2 26  --  30  GLUCOSE 131*  --  122*  BUN 14  --  17  CREATININE 0.84  --  0.79  CALCIUM 9.2  --  9.1  MG  --  2.0  --   PROT 6.0*  --   --   ALBUMIN 3.8  --   --   AST 37  --   --   ALT 10  --   --   ALKPHOS 47  --   --   BILITOT 0.8  --   --   GFRNONAA >60  --  >60  ANIONGAP 9  --  6    Lipids  Recent Labs  Lab 05/26/22 0835  CHOL 149  TRIG 110  HDL 48  LDLCALC 79  CHOLHDL 3.1    Hematology Recent Labs  Lab 05/26/22 0835 05/27/22 0535 05/28/22 0258  WBC 8.3 8.3 8.4  RBC 4.88 4.72 4.35  HGB 14.6 13.8 12.7*  HCT 44.2 42.2 39.0  MCV 90.6 89.4 89.7  MCH 29.9 29.2 29.2  MCHC 33.0 32.7 32.6  RDW 15.0 14.8 14.6  PLT 230 209 187   Thyroid  Recent Labs  Lab 05/25/22 1740  TSH 0.716    BNPNo results for input(s): "BNP", "PROBNP" in the last 168  hours.  DDimer No results for input(s): "DDIMER" in the last 168 hours.   Radiology    CARDIAC CATHETERIZATION  Addendum Date: 05/27/2022     Mid RCA lesion is 25% stenosed.   Mid LM to Ost LAD lesion is 75% stenosed.  Mid LAD lesion is 100% stenosed.  Prox LAD to Mid LAD lesion is 90% stenosed.  Patent LIMA to LAD.   Prox Cx lesion is 90% stenosed.   2nd Mrg-1 lesion is 95% stenosed.   Mid Cx lesion is 25% stenosed.   SVG to OM appears occluded.   2nd Mrg-2 lesion is 50% stenosed.   Mechanical aortic valve.   Calcified left radial artery noted on ultrasound. Severe native two-vessel coronary artery disease including the LAD and circumflex.  Patent LIMA to LAD.  Occluded SVG to OM.  Very complex distal left main, prox circ and prox OM calcified lesion involving several bifurcations.  Plan for surgical consult to evaluate for redo CABG.   Result Date: 05/27/2022   Mid RCA lesion is 25% stenosed.   Mid LM to Ost LAD lesion is 75% stenosed.  Mid LAD lesion is 100% stenosed.  Prox LAD to Mid LAD lesion is 90% stenosed.  Patent LIMA to LAD.   Prox Cx lesion is 90% stenosed.   2nd Mrg lesion is 95% stenosed.   Mid Cx lesion is 25% stenosed.   SVG to OM appears occluded.   Mechanical aortic valve. Severe native two-vessel coronary artery disease including the LAD and circumflex.  Patent LIMA to LAD.  Occluded SVG to OM.  Very complex distal left main, prox circ and prox OM calcified lesion involving several bifurcations.  Plan for surgical consult to evaluate for redo CABG.   ECHOCARDIOGRAM COMPLETE  Result Date: 05/26/2022    ECHOCARDIOGRAM REPORT   Patient Name:   [name] Date of Exam: 05/26/2022 Medical Rec #:  [id number]       Height:       66.0 in Accession #:    [id number]      Weight:  207.0 lb Date of Birth:  07-Jun-1946      BSA:          2.029 m Patient Age:    76 years        BP:           136/78 mmHg Patient Gender: M               HR:           71 bpm. Exam Location:  Inpatient  Procedure: 2D Echo, Cardiac Doppler and Color Doppler Indications:    Chest Pain R07.9  History:        Patient has prior history of Echocardiogram examinations, most                 recent 04/13/2021. Previous Myocardial Infarction and CAD, Prior                 CABG and Pacemaker; Risk Factors:Hypertension, Dyslipidemia and                 Diabetes. Bentall Aortic root replacement and hemishield                 conduit.                  Aortic Valve: mechanical valve is present in the aortic                 position.  Sonographer:    Alvino Chapel RCS Referring Phys: [id number] [place]  1. Left ventricular ejection fraction, by estimation, is 50 to 55%. The left ventricle has low normal function. The left ventricle demonstrates regional wall motion abnormalities (see scoring diagram/findings for description). There is mild left ventricular hypertrophy. Left ventricular diastolic parameters are consistent with Grade I diastolic dysfunction (impaired relaxation).  2. Right ventricular systolic function is normal. The right ventricular size is normal.  3. The mitral valve is normal in structure. No evidence of mitral valve regurgitation. No evidence of mitral stenosis.  4. The aortic valve has been repaired/replaced. Aortic valve regurgitation is not visualized. No aortic stenosis is present. There is a mechanical valve present in the aortic position. Echo findings are consistent with normal structure and function of the aortic valve prosthesis.  5. Bentall. Aortic root/ascending aorta has been repaired/replaced.  6. The inferior vena cava is normal in size with greater than 50% respiratory variability, suggesting right atrial pressure of 3 mmHg. Comparison(s): Prior images reviewed side by side. FINDINGS  Left Ventricle: Left ventricular ejection fraction, by estimation, is 50 to 55%. The left ventricle has low normal function. The left ventricle demonstrates regional wall motion abnormalities.  The left ventricular internal cavity size was normal in size. There is mild left ventricular hypertrophy. Left ventricular diastolic parameters are consistent with Grade I diastolic dysfunction (impaired relaxation).  LV Wall Scoring: The apical septal segment is akinetic. Right Ventricle: The right ventricular size is normal. No increase in right ventricular wall thickness. Right ventricular systolic function is normal. Left Atrium: Left atrial size was normal in size. Right Atrium: Right atrial size was normal in size. Pericardium: There is no evidence of pericardial effusion. Mitral Valve: The mitral valve is normal in structure. No evidence of mitral valve regurgitation. No evidence of mitral valve stenosis. Tricuspid Valve: The tricuspid valve is normal in structure. Tricuspid valve regurgitation is not demonstrated. No evidence of tricuspid stenosis. Aortic Valve: The aortic valve has been repaired/replaced. Aortic valve regurgitation is not visualized.  No aortic stenosis is present. Aortic valve mean gradient measures 6.0 mmHg. Aortic valve peak gradient measures 12.4 mmHg. Aortic valve area, by VTI  measures 1.09 cm. There is a mechanical valve present in the aortic position. Echo findings are consistent with normal structure and function of the aortic valve prosthesis. Pulmonic Valve: The pulmonic valve was normal in structure. Pulmonic valve regurgitation is not visualized. No evidence of pulmonic stenosis. Aorta: Bentall. The aortic root/ascending aorta has been repaired/replaced. Venous: The inferior vena cava is normal in size with greater than 50% respiratory variability, suggesting right atrial pressure of 3 mmHg. IAS/Shunts: No atrial level shunt detected by color flow Doppler. Additional Comments: A device lead is visualized in the right ventricle.  LEFT VENTRICLE PLAX 2D LVIDd:         5.20 cm LVIDs:         3.60 cm LV PW:         1.30 cm LV IVS:        1.30 cm LVOT diam:     1.80 cm LV SV:          40 LV SV Index:   19 LVOT Area:     2.54 cm  RIGHT VENTRICLE RV S prime:     10.40 cm/s TAPSE (M-mode): 1.8 cm LEFT ATRIUM             Index        RIGHT ATRIUM           Index LA diam:        3.65 cm 1.80 cm/m   RA Area:     24.50 cm LA Vol (A2C):   67.0 ml 33.02 ml/m  RA Volume:   87.40 ml  43.07 ml/m LA Vol (A4C):   54.1 ml 26.66 ml/m LA Biplane Vol: 67.2 ml 33.12 ml/m  AORTIC VALVE AV Area (Vmax):    1.04 cm AV Area (Vmean):   1.13 cm AV Area (VTI):     1.09 cm AV Vmax:           176.00 cm/s AV Vmean:          115.000 cm/s AV VTI:            0.363 m AV Peak Grad:      12.4 mmHg AV Mean Grad:      6.0 mmHg LVOT Vmax:         71.60 cm/s LVOT Vmean:        51.250 cm/s LVOT VTI:          0.156 m LVOT/AV VTI ratio: 0.43  AORTA Ao Root diam: 3.30 cm MITRAL VALVE MV Area (PHT): 2.32 cm     SHUNTS MV Decel Time: 327 msec     Systemic VTI:  0.16 m MV E velocity: 74.40 cm/s   Systemic Diam: 1.80 cm MV A velocity: 102.00 cm/s MV E/A ratio:  0.73 Candee Furbish MD Electronically signed by Candee Furbish MD Signature Date/Time: 05/26/2022/4:40:27 PM    Final     Cardiac Studies   Left heart cath: 05/27/2022    Mid RCA lesion is 25% stenosed.   Mid LM to Ost LAD lesion is 75% stenosed.  Mid LAD lesion is 100% stenosed.  Prox LAD to Mid LAD lesion is 90% stenosed.  Patent LIMA to LAD.   Prox Cx lesion is 90% stenosed.   2nd Mrg-1 lesion is 95% stenosed.   Mid Cx lesion is 25% stenosed.   SVG to OM  appears occluded.   2nd Mrg-2 lesion is 50% stenosed.   Mechanical aortic valve.   Calcified left radial artery noted on ultrasound.   Severe native two-vessel coronary artery disease including the LAD and circumflex.  Patent LIMA to LAD.  Occluded SVG to OM.  Very complex distal left main, prox circ and prox OM calcified lesion involving several bifurcations.      Diagnostic Dominance: Right Left Main  Mid LM to Ost LAD lesion is 75% stenosed. The lesion is severely calcified.    Left Anterior  Descending  Prox LAD to Mid LAD lesion is 90% stenosed. The lesion is calcified.  Mid LAD lesion is 100% stenosed.    Left Circumflex  Prox Cx lesion is 90% stenosed.  Mid Cx lesion is 25% stenosed.    Second Obtuse Marginal Branch  2nd Mrg-1 lesion is 95% stenosed. The lesion is severely calcified.  2nd Mrg-2 lesion is 50% stenosed.    Right Coronary Artery  Mid RCA lesion is 25% stenosed.    LIMA Graft To Dist LAD    Graft To Lat 2nd Mrg  Origin to Prox Graft lesion is 100% stenosed.    Intervention   No interventions have been documented.   Left Heart  Aortic Valve The patient has a mechanical prosthetic aortic valve .   Coronary Diagrams  Diagnostic Dominance: Right       Patient Profile  [name] is a 76 y.o. male with  hx of  CAD (s/p CABG x 2 w/ LIMA-LAD and SVG-Cx in 2005), aortic stenosis (s/p Bentall aortic root replacement w/ St.Jude mechanical valve), on coumadin, complete heart block (s/p PPM 2005), Type 2 DM, HTN, HLD, Parkinson disease and GERD  who is being evaluated for recurrent chest pain.   Assessment & Plan    1) NSTEMI #ACS/Unstable angina Patient  history of known CAD s/p CABG  x 2 w/ LIMA-LAD and SVG-Cx in 2005), aortic stenosis (s/p Bentall aortic root replacement w/ St.Jude mechanical valve). He is having recurrent chest pain responsive to SL nitroglycerin. He had another episode of chest pain this morning which was relieved by nitro 0.4 mg x 1. He is pain free since he was treated with SL nitro.   PCI on 05/27/22: Severe native two-vessel coronary artery disease including the LAD and circumflex. Occluded SVG to OM, reflecting  patient's presenting symptoms of recurrent chest pain.   Pt have known thoracic aortic aneurysm, last chest CTA on 05/03/2022 showed unchanged fusiform aneurysmal dilation of the distal ascending aorta measuring up to 4.5 cm. He was reporting chest pain radiating to the back this morning. After the physical exam,  mild Paraspinous TTP noted to mid back likely from prolong laying in the bed. His symptoms c/w musculoskeletal pain. I do not have concerns for worsening thoracic aortic aneurysm. His vitals sings remains stable.  Upon evaluation today he denies chest pain, palpitations, shortness of breath, or dizziness. He is currently on IV heparin infusion.  He is hemodynamically stable.  Telemetry reflecting a Ventricular paced rhythm.   -Patient is scheduled for PCI today. Keep NPO -Plavix 600 mg x 1 today and resume 75 mg once daily from tomorrow. -Continue Heparin Infusion -Continue aspirin 81 mg once daily -Continue metoprolol succinate 100 mg once daily -Continue atorvastatin 40 mg once daily     2)  Aortic stenosis s/p Bentall aortic root replacement w/ St.Jude mechanical valve in 2005 -INR 1.4 today -Echo (05/26/2022); Left ventricular ejection fraction, by estimation, is  50 to 55% which reflected improvement form 45-50 % on 04/2021)   3) HTN:  -Continue Losartan 100 mg once daily -Continue Metoprolol succinate 100 mg once daily   4) HLD: Lipid panel was repeated.   LDL 79 (05/26/2022),  63 (03/2021).  -Continue Atorvastatin 40 mg once daily   5) DM -SSI   6)  HFmrEF -  Echo (05/26/2022); Left ventricular ejection fraction, by estimation, is 50 to 55% which reflected improvement from 45-50% (04/2021)  -Continue Losartan 100 mg once daily -Continue Furosemide 40 mg once daily. - Continue Metoprolol Succinate 100 mg once daily   For questions or updates, please contact [place] Please consult [email or web address] for contact info under    Signed, Teola Bradley, MD  05/28/2022, 8:57 AM    [id number]  ATTENDING ATTESTATION:  After conducting a review of all available clinical information with the care team, interviewing the patient, and performing a physical exam, I agree with the findings and plan described in this note.   GEN: No acute distress.   HEENT:  MMM, no JVD, no  scleral icterus Cardiac: RRR, +mech S2, no murmurs, rubs, or gallops.  Respiratory: Clear to auscultation bilaterally. GI: Soft, nontender, non-distended  MS: No edema; No deformity. Neuro:  Nonfocal  Vasc:  +1 R radial pulse; +2 femoral pulses  Patient drowsy this AM but able to answer questions and can consent for procedure today.  Patient with recurrent rest pain responsive to NTG this AM.  Currently patient is chest pain free.  Reviewed in Heart Team meeting this AM with consensus opinion to pursue PCI with atherectomy of LM/Lcx/OM2.  Will load with plavix '600mg'$  today given need for obligate need for Coumadin with mechanical AVR.  Increase atorvastatin to '80mg'$ .  Right radial pulse +1 with good femoral pulses.  Discussed with Dr. [place].  Will plan on Coumadin and plavix only at discharge once INR >2.  Discussed with pharmacy, plan on administering Coumadin tonight if PCI without issues.  Cont hep coverage.  Lenna Sciara, MD Pager [phone]

## 2022-05-28 NOTE — Progress Notes (Addendum)
Rounding Note    Patient Name: [name] Date of Encounter: 05/28/2022  [place] Cardiologist: Candee Furbish, MD   Subjective   Pt denies chest pain, palpitations, dizziness, or lightheadedness. He is comfortably laying in bed, no acute visible distress.  Pt having recurrent chest pain responsive to SL Nitro. Pt had another episode of chest pain this morning which improved with SL Nitro x 1. His BP is soft with reading of 108/57, SpO2 97% on RA. Telemetry reflects V paced rhythm. IV heparin infusion ongoing.  Family at bedside  Inpatient Medications    Scheduled Meds:  vitamin C  1,000 mg Oral Daily   aspirin  81 mg Oral QPM   atorvastatin  40 mg Oral Daily   calcium carbonate  1 tablet Oral BID WC   carbidopa-levodopa  1 tablet Oral QHS   carbidopa-levodopa  1 tablet Oral QID   cholecalciferol  1,000 Units Oral Daily   clopidogrel  600 mg Oral Once   [START ON 05/29/2022] clopidogrel  75 mg Oral Daily   cyanocobalamin  1,000 mcg Oral Daily   furosemide  40 mg Oral Daily   insulin aspart  0-15 Units Subcutaneous TID WC   loratadine  10 mg Oral Daily   losartan  100 mg Oral Daily   melatonin  5 mg Oral QHS   metoprolol succinate  100 mg Oral Daily   multivitamin with minerals  1 tablet Oral Daily   potassium chloride  10 mEq Oral Daily   sodium chloride flush  3 mL Intravenous Q12H   Continuous Infusions:  sodium chloride     heparin 1,400 Units/hr (05/28/22 0020)   PRN Meds: sodium chloride, acetaminophen, albuterol, fluticasone, nitroGLYCERIN, ondansetron (ZOFRAN) IV, sodium chloride flush   Vital Signs    Vitals:   05/28/22 0021 05/28/22 0507 05/28/22 0600 05/28/22 0748  BP: (!) 142/79 110/61 (!) 141/93 (!) 104/57  Pulse: 69 69  71  Resp: '18 16  15  '$ Temp: 98.2 F (36.8 C) 98.3 F (36.8 C)  98 F (36.7 C)  TempSrc: Oral Oral  Oral  SpO2: 97% 95%  95%  Weight:      Height:        Intake/Output Summary (Last 24 hours) at 05/28/2022  0857 Last data filed at 05/28/2022 0500 Gross per 24 hour  Intake 1102.23 ml  Output 635 ml  Net 467.23 ml      05/26/2022   10:00 AM 05/10/2022    3:46 PM 11/04/2021    9:01 AM  Last 3 Weights  Weight (lbs) 207 lb 207 lb 203 lb  Weight (kg) 93.895 kg 93.895 kg 92.08 kg      Telemetry    V Paced rhythm- Personally Reviewed  ECG    EKG repeated this morning also demonstrated V paced rhythm- Personally Reviewed  Physical Exam  Pt laying in bed on supine. Denies Chest pain. Comfortably communicating without acute visible distress.  GEN: No acute distress.   Neck: No JVD Cardiac: RRR, no murmurs, rubs, or gallops.  Respiratory: Clear to auscultation bilaterally.No wheezing, rales or rhonchi appreciated. GI: Soft, nontender, non-distended  MS: No lower extremities edema. Neuro:  Nonfocal  Psych: Normal affect. A&O x3.   Labs    High Sensitivity Troponin:   Recent Labs  Lab 05/25/22 1214 05/25/22 1454 05/25/22 1713 05/25/22 1928  TROPONINIHS 143* 156* 133* 103*     Chemistry Recent Labs  Lab 05/25/22 1214 05/25/22 1713 05/26/22 0835  NA  141  --  141  K 4.0  --  4.5  CL 106  --  105  CO2 26  --  30  GLUCOSE 131*  --  122*  BUN 14  --  17  CREATININE 0.84  --  0.79  CALCIUM 9.2  --  9.1  MG  --  2.0  --   PROT 6.0*  --   --   ALBUMIN 3.8  --   --   AST 37  --   --   ALT 10  --   --   ALKPHOS 47  --   --   BILITOT 0.8  --   --   GFRNONAA >60  --  >60  ANIONGAP 9  --  6    Lipids  Recent Labs  Lab 05/26/22 0835  CHOL 149  TRIG 110  HDL 48  LDLCALC 79  CHOLHDL 3.1    Hematology Recent Labs  Lab 05/26/22 0835 05/27/22 0535 05/28/22 0258  WBC 8.3 8.3 8.4  RBC 4.88 4.72 4.35  HGB 14.6 13.8 12.7*  HCT 44.2 42.2 39.0  MCV 90.6 89.4 89.7  MCH 29.9 29.2 29.2  MCHC 33.0 32.7 32.6  RDW 15.0 14.8 14.6  PLT 230 209 187   Thyroid  Recent Labs  Lab 05/25/22 1740  TSH 0.716    BNPNo results for input(s): "BNP", "PROBNP" in the last 168  hours.  DDimer No results for input(s): "DDIMER" in the last 168 hours.   Radiology    CARDIAC CATHETERIZATION  Addendum Date: 05/27/2022     Mid RCA lesion is 25% stenosed.   Mid LM to Ost LAD lesion is 75% stenosed.  Mid LAD lesion is 100% stenosed.  Prox LAD to Mid LAD lesion is 90% stenosed.  Patent LIMA to LAD.   Prox Cx lesion is 90% stenosed.   2nd Mrg-1 lesion is 95% stenosed.   Mid Cx lesion is 25% stenosed.   SVG to OM appears occluded.   2nd Mrg-2 lesion is 50% stenosed.   Mechanical aortic valve.   Calcified left radial artery noted on ultrasound. Severe native two-vessel coronary artery disease including the LAD and circumflex.  Patent LIMA to LAD.  Occluded SVG to OM.  Very complex distal left main, prox circ and prox OM calcified lesion involving several bifurcations.  Plan for surgical consult to evaluate for redo CABG.   Result Date: 05/27/2022   Mid RCA lesion is 25% stenosed.   Mid LM to Ost LAD lesion is 75% stenosed.  Mid LAD lesion is 100% stenosed.  Prox LAD to Mid LAD lesion is 90% stenosed.  Patent LIMA to LAD.   Prox Cx lesion is 90% stenosed.   2nd Mrg lesion is 95% stenosed.   Mid Cx lesion is 25% stenosed.   SVG to OM appears occluded.   Mechanical aortic valve. Severe native two-vessel coronary artery disease including the LAD and circumflex.  Patent LIMA to LAD.  Occluded SVG to OM.  Very complex distal left main, prox circ and prox OM calcified lesion involving several bifurcations.  Plan for surgical consult to evaluate for redo CABG.   ECHOCARDIOGRAM COMPLETE  Result Date: 05/26/2022    ECHOCARDIOGRAM REPORT   Patient Name:   [name] Date of Exam: 05/26/2022 Medical Rec #:  [id number]       Height:       66.0 in Accession #:    [id number]      Weight:  207.0 lb Date of Birth:  [birth year]      BSA:          2.029 m Patient Age:    77 years        BP:           136/78 mmHg Patient Gender: M               HR:           71 bpm. Exam Location:  Inpatient  Procedure: 2D Echo, Cardiac Doppler and Color Doppler Indications:    Chest Pain R07.9  History:        Patient has prior history of Echocardiogram examinations, most                 recent 04/13/2021. Previous Myocardial Infarction and CAD, Prior                 CABG and Pacemaker; Risk Factors:Hypertension, Dyslipidemia and                 Diabetes. Bentall Aortic root replacement and hemishield                 conduit.                  Aortic Valve: mechanical valve is present in the aortic                 position.  Sonographer:    Alvino Chapel RCS Referring Phys: [id number] [place]  1. Left ventricular ejection fraction, by estimation, is 50 to 55%. The left ventricle has low normal function. The left ventricle demonstrates regional wall motion abnormalities (see scoring diagram/findings for description). There is mild left ventricular hypertrophy. Left ventricular diastolic parameters are consistent with Grade I diastolic dysfunction (impaired relaxation).  2. Right ventricular systolic function is normal. The right ventricular size is normal.  3. The mitral valve is normal in structure. No evidence of mitral valve regurgitation. No evidence of mitral stenosis.  4. The aortic valve has been repaired/replaced. Aortic valve regurgitation is not visualized. No aortic stenosis is present. There is a mechanical valve present in the aortic position. Echo findings are consistent with normal structure and function of the aortic valve prosthesis.  5. Bentall. Aortic root/ascending aorta has been repaired/replaced.  6. The inferior vena cava is normal in size with greater than 50% respiratory variability, suggesting right atrial pressure of 3 mmHg. Comparison(s): Prior images reviewed side by side. FINDINGS  Left Ventricle: Left ventricular ejection fraction, by estimation, is 50 to 55%. The left ventricle has low normal function. The left ventricle demonstrates regional wall motion abnormalities.  The left ventricular internal cavity size was normal in size. There is mild left ventricular hypertrophy. Left ventricular diastolic parameters are consistent with Grade I diastolic dysfunction (impaired relaxation).  LV Wall Scoring: The apical septal segment is akinetic. Right Ventricle: The right ventricular size is normal. No increase in right ventricular wall thickness. Right ventricular systolic function is normal. Left Atrium: Left atrial size was normal in size. Right Atrium: Right atrial size was normal in size. Pericardium: There is no evidence of pericardial effusion. Mitral Valve: The mitral valve is normal in structure. No evidence of mitral valve regurgitation. No evidence of mitral valve stenosis. Tricuspid Valve: The tricuspid valve is normal in structure. Tricuspid valve regurgitation is not demonstrated. No evidence of tricuspid stenosis. Aortic Valve: The aortic valve has been repaired/replaced. Aortic valve regurgitation is not visualized.  No aortic stenosis is present. Aortic valve mean gradient measures 6.0 mmHg. Aortic valve peak gradient measures 12.4 mmHg. Aortic valve area, by VTI  measures 1.09 cm. There is a mechanical valve present in the aortic position. Echo findings are consistent with normal structure and function of the aortic valve prosthesis. Pulmonic Valve: The pulmonic valve was normal in structure. Pulmonic valve regurgitation is not visualized. No evidence of pulmonic stenosis. Aorta: Bentall. The aortic root/ascending aorta has been repaired/replaced. Venous: The inferior vena cava is normal in size with greater than 50% respiratory variability, suggesting right atrial pressure of 3 mmHg. IAS/Shunts: No atrial level shunt detected by color flow Doppler. Additional Comments: A device lead is visualized in the right ventricle.  LEFT VENTRICLE PLAX 2D LVIDd:         5.20 cm LVIDs:         3.60 cm LV PW:         1.30 cm LV IVS:        1.30 cm LVOT diam:     1.80 cm LV SV:          40 LV SV Index:   19 LVOT Area:     2.54 cm  RIGHT VENTRICLE RV S prime:     10.40 cm/s TAPSE (M-mode): 1.8 cm LEFT ATRIUM             Index        RIGHT ATRIUM           Index LA diam:        3.65 cm 1.80 cm/m   RA Area:     24.50 cm LA Vol (A2C):   67.0 ml 33.02 ml/m  RA Volume:   87.40 ml  43.07 ml/m LA Vol (A4C):   54.1 ml 26.66 ml/m LA Biplane Vol: 67.2 ml 33.12 ml/m  AORTIC VALVE AV Area (Vmax):    1.04 cm AV Area (Vmean):   1.13 cm AV Area (VTI):     1.09 cm AV Vmax:           176.00 cm/s AV Vmean:          115.000 cm/s AV VTI:            0.363 m AV Peak Grad:      12.4 mmHg AV Mean Grad:      6.0 mmHg LVOT Vmax:         71.60 cm/s LVOT Vmean:        51.250 cm/s LVOT VTI:          0.156 m LVOT/AV VTI ratio: 0.43  AORTA Ao Root diam: 3.30 cm MITRAL VALVE MV Area (PHT): 2.32 cm     SHUNTS MV Decel Time: 327 msec     Systemic VTI:  0.16 m MV E velocity: 74.40 cm/s   Systemic Diam: 1.80 cm MV A velocity: 102.00 cm/s MV E/A ratio:  0.73 Candee Furbish MD Electronically signed by Candee Furbish MD Signature Date/Time: 05/26/2022/4:40:27 PM    Final     Cardiac Studies   Left heart cath: 05/27/2022    Mid RCA lesion is 25% stenosed.   Mid LM to Ost LAD lesion is 75% stenosed.  Mid LAD lesion is 100% stenosed.  Prox LAD to Mid LAD lesion is 90% stenosed.  Patent LIMA to LAD.   Prox Cx lesion is 90% stenosed.   2nd Mrg-1 lesion is 95% stenosed.   Mid Cx lesion is 25% stenosed.   SVG to OM  appears occluded.   2nd Mrg-2 lesion is 50% stenosed.   Mechanical aortic valve.   Calcified left radial artery noted on ultrasound.   Severe native two-vessel coronary artery disease including the LAD and circumflex.  Patent LIMA to LAD.  Occluded SVG to OM.  Very complex distal left main, prox circ and prox OM calcified lesion involving several bifurcations.      Diagnostic Dominance: Right Left Main  Mid LM to Ost LAD lesion is 75% stenosed. The lesion is severely calcified.    Left Anterior  Descending  Prox LAD to Mid LAD lesion is 90% stenosed. The lesion is calcified.  Mid LAD lesion is 100% stenosed.    Left Circumflex  Prox Cx lesion is 90% stenosed.  Mid Cx lesion is 25% stenosed.    Second Obtuse Marginal Branch  2nd Mrg-1 lesion is 95% stenosed. The lesion is severely calcified.  2nd Mrg-2 lesion is 50% stenosed.    Right Coronary Artery  Mid RCA lesion is 25% stenosed.    LIMA Graft To Dist LAD    Graft To Lat 2nd Mrg  Origin to Prox Graft lesion is 100% stenosed.    Intervention   No interventions have been documented.   Left Heart  Aortic Valve The patient has a mechanical prosthetic aortic valve .   Coronary Diagrams  Diagnostic Dominance: Right       Patient Profile  Denise Bramblett Cuthrell is a 76 y.o. male with  hx of  CAD (s/p CABG x 2 w/ LIMA-LAD and SVG-Cx in 2005), aortic stenosis (s/p Bentall aortic root replacement w/ St.Jude mechanical valve), on coumadin, complete heart block (s/p PPM 2005), Type 2 DM, HTN, HLD, Parkinson disease and GERD  who is being evaluated for recurrent chest pain.   Assessment & Plan    1) NSTEMI #ACS/Unstable angina Patient  history of known CAD s/p CABG  x 2 w/ LIMA-LAD and SVG-Cx in 2005), aortic stenosis (s/p Bentall aortic root replacement w/ St.Jude mechanical valve). He is having recurrent chest pain responsive to SL nitroglycerin. He had another episode of chest pain this morning which was relieved by nitro 0.4 mg x 1. He is pain free since he was treated with SL nitro.   PCI on 05/27/22: Severe native two-vessel coronary artery disease including the LAD and circumflex. Occluded SVG to OM, reflecting  patient's presenting symptoms of recurrent chest pain.   Pt have known thoracic aortic aneurysm, last chest CTA on 05/03/2022 showed unchanged fusiform aneurysmal dilation of the distal ascending aorta measuring up to 4.5 cm. He was reporting chest pain radiating to the back this morning. After the physical exam,  mild Paraspinous TTP noted to mid back likely from prolong laying in the bed. His symptoms c/w musculoskeletal pain. I do not have concerns for worsening thoracic aortic aneurysm. His vitals sings remains stable.  Upon evaluation today he denies chest pain, palpitations, shortness of breath, or dizziness. He is currently on IV heparin infusion.  He is hemodynamically stable.  Telemetry reflecting a Ventricular paced rhythm.   -Patient is scheduled for PCI today. Keep NPO -Plavix 600 mg x 1 today and resume 75 mg once daily from tomorrow. -Continue Heparin Infusion -Continue aspirin 81 mg once daily -Continue metoprolol succinate 100 mg once daily -Continue atorvastatin 40 mg once daily     2)  Aortic stenosis s/p Bentall aortic root replacement w/ St.Jude mechanical valve in 2005 -INR 1.4 today -Echo (05/26/2022); Left ventricular ejection fraction, by estimation, is  50 to 55% which reflected improvement form 45-50 % on 04/2021)   3) HTN:  -Continue Losartan 100 mg once daily -Continue Metoprolol succinate 100 mg once daily   4) HLD: Lipid panel was repeated.   LDL 79 (05/26/2022),  63 (03/2021).  -Continue Atorvastatin 40 mg once daily   5) DM -SSI   6)  HFmrEF -  Echo (05/26/2022); Left ventricular ejection fraction, by estimation, is 50 to 55% which reflected improvement from 45-50% (04/2021)  -Continue Losartan 100 mg once daily -Continue Furosemide 40 mg once daily. - Continue Metoprolol Succinate 100 mg once daily   For questions or updates, please contact [place] Please consult [email or web address] for contact info under    Signed, Teola Bradley, MD  05/28/2022, 8:57 AM    [id number]  ATTENDING ATTESTATION:  After conducting a review of all available clinical information with the care team, interviewing the patient, and performing a physical exam, I agree with the findings and plan described in this note.   GEN: No acute distress.   HEENT:  MMM, no JVD, no  scleral icterus Cardiac: RRR, +mech S2, no murmurs, rubs, or gallops.  Respiratory: Clear to auscultation bilaterally. GI: Soft, nontender, non-distended  MS: No edema; No deformity. Neuro:  Nonfocal  Vasc:  +1 R radial pulse; +2 femoral pulses  Patient drowsy this AM but able to answer questions and can consent for procedure today.  Patient with recurrent rest pain responsive to NTG this AM.  Currently patient is chest pain free.  Reviewed in Heart Team meeting this AM with consensus opinion to pursue PCI with atherectomy of LM/Lcx/OM2.  Will load with plavix '600mg'$  today given need for obligate need for Coumadin with mechanical AVR.  Increase atorvastatin to '80mg'$ .  Right radial pulse +1 with good femoral pulses.  Discussed with Dr. Martinique.  Will plan on Coumadin, plavix, and ASA x 1 month at discharge once INR >2.  Discussed with pharmacy, plan on administering Coumadin tonight if PCI without issues.  Cont hep coverage.  I have reviewed the risks, indications, and alternatives to cardiac catheterization, possible angioplasty, and stenting with the patient. Risks include but are not limited to bleeding, infection, vascular injury, stroke, myocardial infection, arrhythmia, kidney injury, radiation-related injury in the case of prolonged fluoroscopy use, emergency cardiac surgery, and death. The patient understands the risks of serious complication is 1-2 in 5361 with diagnostic cardiac cath and 1-2% or less with angioplasty/stenting.   Lenna Sciara, MD Pager [phone]

## 2022-05-28 NOTE — Care Management Important Message (Signed)
Important Message  Patient Details  Name: ADIEN KIMMEL MRN: [id number] Date of Birth: Aug 31, 1945   Medicare Important Message Given:  Yes     Shelda Altes 05/28/2022, 9:46 AM

## 2022-05-29 DIAGNOSIS — I214 Non-ST elevation (NSTEMI) myocardial infarction: Secondary | ICD-10-CM | POA: Diagnosis not present

## 2022-05-29 LAB — CBC
HCT: 35 % — ABNORMAL LOW (ref 39.0–52.0)
Hemoglobin: 12 g/dL — ABNORMAL LOW (ref 13.0–17.0)
MCH: 30.3 pg (ref 26.0–34.0)
MCHC: 34.3 g/dL (ref 30.0–36.0)
MCV: 88.4 fL (ref 80.0–100.0)
Platelets: 180 10*3/uL (ref 150–400)
RBC: 3.96 MIL/uL — ABNORMAL LOW (ref 4.22–5.81)
RDW: 14.5 % (ref 11.5–15.5)
WBC: 7.1 10*3/uL (ref 4.0–10.5)
nRBC: 0 % (ref 0.0–0.2)

## 2022-05-29 LAB — BASIC METABOLIC PANEL
Anion gap: 7 (ref 5–15)
BUN: 14 mg/dL (ref 8–23)
CO2: 25 mmol/L (ref 22–32)
Calcium: 8.5 mg/dL — ABNORMAL LOW (ref 8.9–10.3)
Chloride: 104 mmol/L (ref 98–111)
Creatinine, Ser: 0.84 mg/dL (ref 0.61–1.24)
GFR, Estimated: >60 mL/min (ref >60)
Glucose, Bld: 100 mg/dL — ABNORMAL HIGH (ref 70–99)
Potassium: 3.9 mmol/L (ref 3.5–5.1)
Sodium: 136 mmol/L (ref 135–145)

## 2022-05-29 LAB — POCT ACTIVATED CLOTTING TIME
Activated Clotting Time: 251 seconds
Activated Clotting Time: 305 seconds
Activated Clotting Time: 251 seconds

## 2022-05-29 LAB — GLUCOSE, CAPILLARY
Glucose-Capillary: 101 mg/dL — ABNORMAL HIGH (ref 70–99)
Glucose-Capillary: 113 mg/dL — ABNORMAL HIGH (ref 70–99)

## 2022-05-29 LAB — HEPARIN LEVEL (UNFRACTIONATED)
Heparin Unfractionated: 0.14 IU/mL — ABNORMAL LOW (ref 0.30–0.70)
Heparin Unfractionated: 0.51 IU/mL (ref 0.30–0.70)

## 2022-05-29 LAB — PROTIME-INR
INR: 1.4 — ABNORMAL HIGH (ref 0.8–1.2)
Prothrombin Time: 16.6 seconds — ABNORMAL HIGH (ref 11.4–15.2)

## 2022-05-29 MED ORDER — ATORVASTATIN CALCIUM 80 MG PO TABS: 80.0000 mg | ORAL_TABLET | Freq: Every day | ORAL | 1 refills | Status: DC

## 2022-05-29 MED ORDER — WARFARIN SODIUM 5 MG PO TABS: 10.0000 mg | ORAL_TABLET | Freq: Once | ORAL | Status: DC

## 2022-05-29 MED ORDER — CLOPIDOGREL BISULFATE 75 MG PO TABS: 75.0000 mg | ORAL_TABLET | Freq: Every day | ORAL | 1 refills | Status: DC

## 2022-05-29 NOTE — Progress Notes (Signed)
[facility] for warfarin and heparin Indication: St. Jude Mechanical valve  Allergies  Allergen Reactions   Bee Venom Shortness Of Breath and Swelling   Codeine Phosphate Hives, Shortness Of Breath, Itching, Rash and Other (See Comments)    Tolerated multiple doses of IV morphine   Metoprolol Tartrate Other (See Comments)    loss of vision. Can take the XL tablet not regular    Pramipexole Other (See Comments)    Hallucinations     Tramadol     Pt has hallucinations    Ramipril Rash and Cough    Patient Measurements: Heparin dosing weight: 84 kg  Vital Signs: Temp: 97.8 F (36.6 C) (11/18 0410) Temp Source: Oral (11/18 0410)  Labs: Recent Labs    05/26/22 0835 05/26/22 1300 05/27/22 0535 05/28/22 0258 05/28/22 1008 05/29/22 0144  HGB 14.6  --  13.8 12.7*  --  12.0*  HCT 44.2  --  42.2 39.0  --  35.0*  PLT 230  --  209 187  --  180  LABPROT 21.7*   < > 17.7* 17.1*  --  16.6*  INR 1.9*   < > 1.5* 1.4*  --  1.4*  HEPARINUNFRC  --    < > 0.68  --  0.35 0.14*  CREATININE 0.79  --   --   --  0.84 0.84   < > = values in this interval not displayed.     Estimated Creatinine Clearance: 79 mL/min (by C-G formula based on SCr of 0.84 mg/dL).   Medical History: Past Medical History:  Diagnosis Date   Acute renal failure ([place]) 02/09/2013   Aortic stenosis 08/25/2010   a. Bentall aortic root replacement with a St. Jude mechanical valve and Hemashield conduit 02/2004.   Arthritis    Asthma    AV BLOCK, COMPLETE    a. s/p St Jude dual chamber pacemaker 02/2004.   CAD (coronary artery disease)    a. s/p CABGx2 (LIMA-dLAD, SVG-Cx). b. Low risk nuc 12/2012 without ischemia, EF 46% mild apical hypokinesia (EF 55% inf HK by echo).   Carotid artery disease ([facility])    a. 0-39% bilateral ICA stenosis, stable mild hard plaque in carotid bulbs. F/u 03/2014 recommended.   Diabetes mellitus without complication ([place])    type 2    DIVERTICULOSIS, COLON  10/17/2007   Ejection fraction    a. EF 55% with inf HK, mild MR by echo 12/2012.   GERD (gastroesophageal reflux disease)    hxof    GOUT 01/16/2007   Headache    hx of migraines    HEMORRHOIDS, INTERNAL 11/01/2008   Hx of adenomatous polyp of colon 02/2019   no recall needed   HYPERLIPIDEMIA 01/16/2007   HYPERTENSION 01/16/2007   Impaired glucose tolerance    LEG EDEMA 11/27/2008   Special screening for malignant neoplasm of prostate    Warfarin anticoagulation    AVR    Assessment: 65 yom with a history of CAD (s/p CABG), aortic stenosis (s/p Bentall aortic root replacement w/ St.Jude mechanical valve), CHB (s/p PPM 2005), T2DM, HTN, HLD, Parkinson disease and GERD. Patient is presenting with chest pain. Heparin per pharmacy consult placed for chest pain/ACS.  Patient is on warfarin prior to arrival. Last dose 11/13. Home dose is 5 mg (half-tablet) every Sun, Tue, Thu; 10 mg (one tablet) all other days. INR goal per last anticoag visit with cardiology is 2-3. Per cardiology, plan to continue aspirin for one month at  discharge with long term plan for just warfarin and plavix.   Heparin level therapeutic this morning at 0.51, INR below goal at 1.4. Hgb 12, platelets are WNL. No issues with infusion reported, no signs/symptoms of bleeding noted.    Goal of Therapy:  INR goal 2-3 per outpatient records Heparin level 0.3-0.7 units/ml Monitor platelets by anticoagulation protocol: Yes   Plan:  Continue heparin infusion at 1400 units/hour Give Warfarin 10 mg x1 this afternoon Daily heparin level, INR, and CBC Monitor signs/symptoms of bleeding  Louanne Belton, PharmD, [facility] PGY1 Pharmacy Resident 05/29/2022 7:38 AM

## 2022-05-29 NOTE — Discharge Summary (Signed)
Discharge Summary    Patient ID: [name] MRN: [id number]; DOB: 04/17/1946  Admit date: 05/25/2022 Discharge date: 05/29/2022  PCP:  [name], NP   [facility] Cardiologist:  Candee Furbish, MD  Electrophysiologist:  Cristopher Peru, MD       Discharge Diagnoses    Principal Problem:   NSTEMI (non-ST elevated myocardial infarction) [facility]) Active Problems:   Hx of aortic valve replacement, mechanical   Hyperlipidemia LDL goal <70   Essential hypertension   Hx of CABG   Diagnostic Studies/Procedures    Echocardiogram: 05/26/2022 IMPRESSIONS     1. Left ventricular ejection fraction, by estimation, is 50 to 55%. The  left ventricle has low normal function. The left ventricle demonstrates  regional wall motion abnormalities (see scoring diagram/findings for  description). There is mild left  ventricular hypertrophy. Left ventricular diastolic parameters are  consistent with Grade I diastolic dysfunction (impaired relaxation).   2. Right ventricular systolic function is normal. The right ventricular  size is normal.   3. The mitral valve is normal in structure. No evidence of mitral valve  regurgitation. No evidence of mitral stenosis.   4. The aortic valve has been repaired/replaced. Aortic valve  regurgitation is not visualized. No aortic stenosis is present. There is a  mechanical valve present in the aortic position. Echo findings are  consistent with normal structure and function of  the aortic valve prosthesis.   5. Bentall. Aortic root/ascending aorta has been repaired/replaced.   6. The inferior vena cava is normal in size with greater than 50%  respiratory variability, suggesting right atrial pressure of 3 mmHg.   Comparison(s): Prior images reviewed side by side.    Cardiac Catheterization: 05/27/2022   Mid RCA lesion is 25% stenosed.   Mid LM to Ost LAD lesion is 75% stenosed.  Mid LAD lesion is 100% stenosed.  Prox LAD to Mid  LAD lesion is 90% stenosed.  Patent LIMA to LAD.   Prox Cx lesion is 90% stenosed.   2nd Mrg-1 lesion is 95% stenosed.   Mid Cx lesion is 25% stenosed.   SVG to OM appears occluded.   2nd Mrg-2 lesion is 50% stenosed.   Mechanical aortic valve.   Calcified left radial artery noted on ultrasound.   Severe native two-vessel coronary artery disease including the LAD and circumflex.  Patent LIMA to LAD.  Occluded SVG to OM.  Very complex distal left main, prox circ and prox OM calcified lesion involving several bifurcations.     Plan for surgical consult to evaluate for redo CABG.  Coronary Atherectomy: 05/28/2022   Prox Cx lesion is 90% stenosed.   Mid LM to Ost LAD lesion is 75% stenosed.   2nd Mrg-1 lesion is 95% stenosed.   2nd Mrg-2 lesion is 50% stenosed.   A drug-eluting stent was successfully placed using a STENT ONYX FRONTIER 2.5X30.   A stent was successfully placed.   A drug-eluting stent was successfully placed using a STENT ONYX FRONTIER 3.5X18.   Post intervention, there is a 0% residual stenosis.   Post intervention, there is a 0% residual stenosis.   Post intervention, there is a 0% residual stenosis.   Successful PCI of the left main, proximal LCx, OM to the mid vessel incorporating orbital atherectomy, Shockwave balloon lithotripsy and DES x 2 in overlapping fashion.   Plan; DAPT with ASA for one month, Plavix for 12 months. May resume Coumadin today.    History of Present Illness  [name] is a 76 y.o. male with past medical history of CAD (s/p CABGx2 in 2005 with LIMA-LAD and SVG-LCx), aortic stenosis (s/p Bentall aortic root replacement with St[employer] Jude mechanical valve), CHB (s/p PPM placement), ascending thoracic aortic aneurysm, Parkinson's disease, HTN, HLD and Type 2 DM who presented to [facility] ED on 05/25/2022 for evaluation of chest pain.  He reported developing sternal chest discomfort that morning along with associated diaphoresis. He was found to  have an NSTEMI with initial troponin values at 143 and 156. Given that he was on Coumadin prior to admission, this was held and he was started on Heparin once INR less than 2.  Hospital Course     Consultants: None  Follow-up echocardiogram showed his EF was low-normal at 50 to 55% with wall motion abnormalities noted. RV function was normal and his aortic valve prosthesis was functioning normally. Once his INR allowed, he underwent a cardiac catheterization on 05/27/2022 which showed severe native two-vessel CAD involving the LAD and LCx with patent LIMA to LAD and occluded SVG to OM. Given the very complex lesion involving the distal left main, proximal LCx and proximal OM, surgical consult was recommended to evaluate for redo CABG. He was evaluated by Dr. Lavonna Monarch and his case was discussed at conference and it was felt that PCI would be the best option for the patient as he would be at high-risk for surgery due to the risk of losing the LIMA along with his age and known Parkinson's disease. Therefore, he was scheduled for coronary atherectomy and this was performed on 05/28/2022 with successful PCI of the LM, proximal LCx and OM with shockwave balloon lithotripsy and DES x2. It was recommend to be on DAPT with ASA and Plavix for 1 month and then stop ASA. He was restarted on Coumadin following the procedure.  The following morning, he was evaluated by Dr. Quentin Ore and reported overall feeling well and denied any recurrent chest pain. He did ambulate with cardiac rehab without recurrent angina. INR was at 1.4 but he was not felt to require bridging given no history of CVA or valve thrombosis. He does have previously scheduled follow-up with Richardson Dopp, PA on 06/02/2022 and will keep that visit. A staff message has also been sent to the office to arrange for a follow-up Coumadin check as hopefully this can be performed on the same day.   Did the patient have an acute coronary syndrome (MI, NSTEMI,  STEMI, etc) this admission?:  Yes                               AHA/ACC Clinical Performance & Quality Measures: Aspirin prescribed? - Yes ADP Receptor Inhibitor (Plavix/Clopidogrel, Brilinta/Ticagrelor or Effient/Prasugrel) prescribed (includes medically managed patients)? - Yes Beta Blocker prescribed? - Yes High Intensity Statin (Lipitor 40-71m or Crestor 20-411m prescribed? - Yes EF assessed during THIS hospitalization? - Yes For EF <40%, was ACEI/ARB prescribed? - Not Applicable (EF >/= 4041%For EF <40%, Aldosterone Antagonist (Spironolactone or Eplerenone) prescribed? - Not Applicable (EF >/= 4042%Cardiac Rehab Phase II ordered (including medically managed patients)? - Yes      Discharge Vitals Blood pressure (!) 144/67, pulse 74, temperature 98 F (36.7 C), temperature source Oral, resp. rate 16, height _0  (1.676 m), weight 93.9 kg, SpO2 98 %.  Filed Weights   05/26/22 1000  Weight: 93.9 kg    Labs & Radiologic Studies  CBC Recent Labs    05/28/22 0258 05/29/22 0144  WBC 8.4 7.1  HGB 12.7* 12.0*  HCT 39.0 35.0*  MCV 89.7 88.4  PLT 187 517   Basic Metabolic Panel Recent Labs    05/28/22 1008 05/29/22 0144  NA 138 136  K 4.4 3.9  CL 106 104  CO2 21* 25  GLUCOSE 98 100*  BUN 14 14  CREATININE 0.84 0.84  CALCIUM 9.0 8.5*   Liver Function Tests No results for input(s): "AST", "ALT", "ALKPHOS", "BILITOT", "PROT", "ALBUMIN" in the last 72 hours. No results for input(s): "LIPASE", "AMYLASE" in the last 72 hours. High Sensitivity Troponin:   Recent Labs  Lab 05/25/22 1214 05/25/22 1454 05/25/22 1713 05/25/22 1928  TROPONINIHS 143* 156* 133* 103*    BNP Invalid input(s): "POCBNP" D-Dimer No results for input(s): "DDIMER" in the last 72 hours. Hemoglobin A1C No results for input(s): "HGBA1C" in the last 72 hours. Fasting Lipid Panel No results for input(s): "CHOL", "HDL", "LDLCALC", "TRIG", "CHOLHDL", "LDLDIRECT" in the last 72 hours. Thyroid  Function Tests No results for input(s): "TSH", "T4TOTAL", "T3FREE", "THYROIDAB" in the last 72 hours.  Invalid input(s): "FREET3" _____________   DG Chest 1 View  Result Date: 05/25/2022 CLINICAL DATA:  Chest pain beginning today EXAM: CHEST  1 VIEW COMPARISON:  06/07/2016 FINDINGS: Previous median sternotomy, CABG and mitral valve replacement. Heart size remains normal. Tortuous aorta as seen previously. Dual lead pacemaker in place. The pulmonary vascularity is normal. The lungs are clear. No acute bone finding. IMPRESSION: No active disease. Previous CABG and mitral valve replacement. Dual lead pacemaker. Electronically Signed   By: Nelson Chimes M.D.   On: 05/25/2022 12:44   CUP PACEART REMOTE DEVICE CHECK  Result Date: 05/06/2022 Scheduled remote reviewed. Normal device function.  The atrial arrhythmias are all less than one minute Next remote 91 days. Kathy Breach, RN, CCDS, [employer]  CT ANGIO CHEST AORTA W/CM & OR WO/CM  Result Date: 05/03/2022 CLINICAL DATA:  76 year old male with history of ascending aortic aneurysm status post Bentall. Follow-up study. EXAM: CT ANGIOGRAPHY CHEST WITH CONTRAST TECHNIQUE: Multidetector CT imaging of the chest was performed using the standard protocol during bolus administration of intravenous contrast. Multiplanar CT image reconstructions and MIPs were obtained to evaluate the vascular anatomy. RADIATION DOSE REDUCTION: This exam was performed according to the departmental dose-optimization program which includes automated exposure control, adjustment of the mA and/or kV according to patient size and/or use of iterative reconstruction technique. CONTRAST:  Seventy-five mL Omnipaque 350, intravenous COMPARISON:  05/01/2021 FINDINGS: Cardiovascular: Preferential opacification of the thoracic aorta. Scattered atherosclerotic calcifications about the aortic arch and descending thoracic aorta. Normal heart size. No pericardial effusion. Sinues of  Valsalva: Status post valve replacement without complicating features. Ascending Aorta: Status post tube graft repair proximal ascending thoracic aorta. Native distal ascending thoracic aorta measures up to 45 mm in maximum short axis diameter. Aortic Arch: 41 mm ,unchanged Descending aorta: 28 mm at the level of the carina ,unchanged Branch vessels: Conventional branching pattern. Mild scattered atherosclerotic changes without evidence of flow-limiting stenosis. Coronary arteries: Normal origins and courses. Postsurgical changes after coronary artery bypass. Severe atherosclerotic calcifications. Main pulmonary artery: 27 mm ,unchanged. No evidence of central pulmonary embolism. Pulmonary veins: No anomalous pulmonary venous return. No evidence of left atrial appendage thrombus. Upper abdominal vasculature: Within normal limits. Mediastinum/Nodes: Again seen are a few scattered calcified mediastinal lymph nodes. No enlarged mediastinal, hilar, or axillary lymph nodes. Thyroid gland, trachea, and  esophagus demonstrate no significant findings. Lungs/Pleura: No focal consolidations. No suspicious pulmonary nodules. Unchanged scattered punctate calcified granulomas. No pleural effusion or pneumothorax. Upper Abdomen: Markedly decreased attenuation of the hepatic parenchyma relative to the spleen. Scattered punctate calcifications throughout the liver and spleen. The remaining visualized upper abdomen is within normal limits. Musculoskeletal: No acute osseous abnormality. Similar appearing multilevel degenerative changes of the visualized thoracic spine. Intact well-healed median sternotomy. Review of the MIP images confirms the above findings. IMPRESSION: Vascular: 1. Unchanged fusiform aneurysmal dilation of the distal ascending thoracic aorta measuring up to 4.5 cm. Recommend semi-annual imaging followup by CTA or MRA and referral to cardiothoracic surgery if not already obtained. This recommendation follows 2010  ACCF/AHA/AATS/ACR/ASA/SCA/SCAI/SIR/STS/SVM Guidelines for the Diagnosis and Management of Patients With Thoracic Aortic Disease. Circulation. 2010; 121: T254-D826. Aortic aneurysm NOS (ICD10-I71.9) 2. Similar appearing postsurgical changes after aortic root repair and coronary artery bypass graft without complicating features. 3.  Aortic Atherosclerosis (ICD10-I70.0). Non-Vascular: 1. No acute intrathoracic abnormality. 2. Hepatic steatosis. 3. Similar appearing evidence of prior granulomatous disease. Ruthann Cancer, MD [facility] Electronically Signed   By: Ruthann Cancer M.D.   On: 05/03/2022 12:32    Disposition   Pt is being discharged home today in good condition.  Follow-up Plans & Appointments     Follow-up Information     Liliane Shi, PA-C Follow up on 06/02/2022.   Specialties: Cardiology, Physician Assistant Why: Keep scheduled Cardiology Follow-up for 06/02/2022 at 1:55 PM. Contact information: [address] [place] [address] [phone]                Discharge Instructions     Amb Referral to Cardiac Rehabilitation   Complete by: As directed    Diagnosis:  Coronary Stents NSTEMI     After initial evaluation and assessments completed: Virtual Based Care may be provided alone or in conjunction with Phase 2 Cardiac Rehab based on patient barriers.: Yes   Intensive Cardiac Rehabilitation (ICR) [place] location only OR Traditional Cardiac Rehabilitation (TCR) *If criteria for ICR are not met will enroll in TCR [facility] only): Yes   Diet - low sodium heart healthy   Complete by: As directed    Discharge instructions   Complete by: As directed    [place].  PLEASE ATTEND ALL SCHEDULED FOLLOW-UP APPOINTMENTS.   Activity: Increase activity slowly as tolerated. You may shower, but no soaking baths (or swimming) for  1 week. No driving for 1 week. No lifting over 10 lbs for 2 weeks. No sexual activity for 2 weeks.   You May Return to Work: in 1 week (if applicable)  Wound Care: You may wash cath site gently with soap and water. Keep cath site clean and dry. If you notice pain, swelling, bleeding or pus at your cath site, please call [phone].   Increase activity slowly   Complete by: As directed         Discharge Medications   Allergies as of 05/29/2022       Reactions   Bee Venom Shortness Of Breath, Swelling   Codeine Phosphate Hives, Shortness Of Breath, Itching, Rash, Other (See Comments)   Tolerated multiple doses of IV morphine   Metoprolol Tartrate Other (See Comments)   loss of vision. Can take the XL tablet not regular    Pramipexole Other (See Comments)   Hallucinations  Tramadol    Pt has hallucinations    Ramipril Rash, Cough        Medication List     TAKE these medications    Accu-Chek FastClix Lancets Misc USE TO CHECK BLOOD SUGAR TWICE A DAY AND AS NEEDED   Accu-Chek Guide w/Device Kit Use as directed   Accu-Chek SmartView test strip Generic drug: glucose blood Use as instructed   acetaminophen 650 MG CR tablet Commonly known as: TYLENOL Take 1,300 mg by mouth 2 (two) times daily.   albuterol 108 (90 Base) MCG/ACT inhaler Commonly known as: VENTOLIN HFA INHALE 2 PUFFS INTO THE LUNGS EVERY 6 HOURS AS NEEDED FOR WHEEZING OR SHORTNESS OF BREATH. What changed: how much to take   aspirin 81 MG tablet Take 81 mg by mouth every evening. Notes to patient: CAN STOP ASPIRIN ON 06/28/2022.   atorvastatin 80 MG tablet Commonly known as: LIPITOR Take 1 tablet (80 mg total) by mouth daily. What changed:  medication strength See the new instructions.   calcium carbonate 600 MG Tabs tablet Commonly known as: OS-CAL Take 600 mg by mouth 2 (two) times daily with a meal.   carbidopa-levodopa 25-100 MG tablet Commonly known as: SINEMET IR Take 1 tablet by  mouth 4 (four) times daily.   carbidopa-levodopa 50-200 MG tablet Commonly known as: SINEMET CR Take 1 tablet by mouth at bedtime.   cetirizine 10 MG tablet Commonly known as: ZYRTEC Take 10 mg by mouth daily.   cholecalciferol 25 MCG (1000 UNIT) tablet Commonly known as: VITAMIN D3 Take 1,000 Units by mouth daily.   clopidogrel 75 MG tablet Commonly known as: PLAVIX Take 1 tablet (75 mg total) by mouth daily. Start taking on: May 30, 2022   cyanocobalamin 1000 MCG tablet Commonly known as: VITAMIN B12 Take 1,000 mcg by mouth daily.   fluticasone 50 MCG/ACT nasal spray Commonly known as: FLONASE Place 2 sprays into both nostrils daily. What changed:  when to take this reasons to take this   furosemide 40 MG tablet Commonly known as: LASIX TAKE 1 TABLET EVERY DAY   losartan 100 MG tablet Commonly known as: COZAAR TAKE 1 TABLET EVERY DAY (PLEASE MAKE OVERDUE APPT WITH DR. Lovena Le BEFORE ANY MORE REFILLS, 2ND ATTEMPT, CALL [phone]) What changed: See the new instructions.   melatonin 5 MG Tabs Take 5 mg by mouth at bedtime.   metFORMIN 500 MG tablet Commonly known as: GLUCOPHAGE TAKE 1/2 TABLET EVERY DAY WITH BREAKFAST - SCHEDULE APPT FOR FUTURE REFILLS What changed: See the new instructions. Notes to patient: DO NOT RESTART UNTIL 05/31/2022.   metoprolol succinate 100 MG 24 hr tablet Commonly known as: TOPROL-XL TAKE 1 TABLET EVERY DAY   multivitamin tablet Take 1 tablet by mouth daily.   nitroGLYCERIN 0.4 MG SL tablet Commonly known as: NITROSTAT Place 1 tablet (0.4 mg total) under the tongue every 5 (five) minutes as needed. For chest pain   PHILLIPS COLON HEALTH PO Take 1 capsule by mouth daily.   potassium chloride 10 MEQ tablet Commonly known as: KLOR-CON TAKE 1 TABLET EVERY DAY What changed: when to take this   [place] Take 2 capsules by mouth daily.   vitamin C 1000 MG tablet Take 1,000 mg by mouth daily.    warfarin 10 MG tablet Commonly known as: COUMADIN Take as directed. If you are unsure how to take this medication, talk to your nurse or doctor. Original instructions: TAKE 1/2 TO 1 TABLET DAILY OR AS DIRECTED  BY COUMADIN CLINIC What changed:  how much to take how to take this when to take this additional instructions Notes to patient: 17m Sun/Tues/Thurs and 131mall other days           Outstanding Labs/Studies   INR Check (staff message has been sent to the office to arrange for this)  Duration of Discharge Encounter   Greater than 30 minutes including physician time.  Signed, BrErma HeritagePA-C 05/29/2022, 12:24 PM

## 2022-05-29 NOTE — Progress Notes (Signed)
0100: call from central telemetry monitoring, notifying this nurse that pt pulled off all leads. Upon entry to pt room pt is attempting to get out of bed with one of his PIV hanging off the bed.  Pt stated he had a "funny dream". Pt is able to answer all orientation questions. Pt had remained A&O x4 throughout shift, compliant with care plan. Pt re-educated on keeping ALL medical monitoring on to provide adequate and  medical proper care. Bed alarm set with pt door half way open. 0400: Pt bed alarm sounds,pt once again attempting to climb out of bed with gown completely off and all leads taken out. PT re oriented, repositioned in bed. All lights in room turned on with door fully open, bed alarm in place.

## 2022-05-29 NOTE — Progress Notes (Signed)
CARDIAC REHAB PHASE I     Pt eating breakfast feeling well today. Post MI/stent teaching including site care, restrictions, risk factors, heart healthy diabetic diet, MI booklet, antiplatelet therapy importance, exercise guidelines and CRP2 reviewed. All questions and concerns addressed. Will refer to AP for CRP2. Pt is hopeful for home today.    [phone] Vanessa Barbara, RN BSN 05/29/2022 8:32 AM

## 2022-05-29 NOTE — Progress Notes (Signed)
Rounding Note    Patient Name: [name] Date of Encounter: 05/29/2022  [place] Cardiologist: Candee Furbish, MD   Subjective   NAEO. S/P PCI to L coronary yesterday.  Inpatient Medications    Scheduled Meds:  vitamin C  1,000 mg Oral Daily   aspirin  81 mg Oral Daily   atorvastatin  80 mg Oral Daily   calcium carbonate  1 tablet Oral BID WC   carbidopa-levodopa  1 tablet Oral QHS   carbidopa-levodopa  1 tablet Oral QID   cholecalciferol  1,000 Units Oral Daily   clopidogrel  75 mg Oral Daily   cyanocobalamin  1,000 mcg Oral Daily   furosemide  40 mg Oral Daily   insulin aspart  0-15 Units Subcutaneous TID WC   loratadine  10 mg Oral Daily   losartan  100 mg Oral Daily   melatonin  5 mg Oral QHS   metoprolol succinate  100 mg Oral Daily   multivitamin with minerals  1 tablet Oral Daily   potassium chloride  10 mEq Oral Daily   sodium chloride flush  3 mL Intravenous Q12H   Warfarin - Pharmacist Dosing Inpatient   Does not apply q1600   Continuous Infusions:  sodium chloride     heparin 1,400 Units/hr (05/29/22 0700)   PRN Meds: sodium chloride, acetaminophen, albuterol, fluticasone, nitroGLYCERIN, ondansetron (ZOFRAN) IV, sodium chloride flush   Vital Signs    Vitals:   05/28/22 1325 05/28/22 1932 05/29/22 0410 05/29/22 0740  BP:  121/62  (!) 144/67  Pulse: (!) 0 79  74  Resp:  17  16  Temp:  98.2 F (36.8 C) 97.8 F (36.6 C) 98 F (36.7 C)  TempSrc:  Oral Oral Oral  SpO2:  96%  98%  Weight:      Height:        Intake/Output Summary (Last 24 hours) at 05/29/2022 1019 Last data filed at 05/29/2022 0740 Gross per 24 hour  Intake 128.38 ml  Output 850 ml  Net -721.62 ml      05/26/2022   10:00 AM 05/10/2022    3:46 PM 11/04/2021    9:01 AM  Last 3 Weights  Weight (lbs) 207 lb 207 lb 203 lb  Weight (kg) 93.895 kg 93.895 kg 92.08 kg      Telemetry    Personally Reviewed  ECG    Personally Reviewed  Physical Exam    GEN: No acute distress.   Neck: No JVD Cardiac: RRR, no murmurs, rubs, or gallops.  Respiratory: Clear to auscultation bilaterally. GI: Soft, nontender, non-distended  MS: No edema; No deformity. Neuro:  Nonfocal  Psych: Normal affect   Labs    High Sensitivity Troponin:   Recent Labs  Lab 05/25/22 1214 05/25/22 1454 05/25/22 1713 05/25/22 1928  TROPONINIHS 143* 156* 133* 103*     Chemistry Recent Labs  Lab 05/25/22 1214 05/25/22 1713 05/26/22 0835 05/28/22 1008 05/29/22 0144  NA 141  --  141 138 136  K 4.0  --  4.5 4.4 3.9  CL 106  --  105 106 104  CO2 26  --  30 21* 25  GLUCOSE 131*  --  122* 98 100*  BUN 14  --  '17 14 14  '$ CREATININE 0.84  --  0.79 0.84 0.84  CALCIUM 9.2  --  9.1 9.0 8.5*  MG  --  2.0  --   --   --   PROT 6.0*  --   --   --   --  ALBUMIN 3.8  --   --   --   --   AST 37  --   --   --   --   ALT 10  --   --   --   --   ALKPHOS 47  --   --   --   --   BILITOT 0.8  --   --   --   --   GFRNONAA >60  --  >60 >60 >60  ANIONGAP 9  --  '6 11 7    '$ Lipids  Recent Labs  Lab 05/26/22 0835  CHOL 149  TRIG 110  HDL 48  LDLCALC 79  CHOLHDL 3.1    Hematology Recent Labs  Lab 05/27/22 0535 05/28/22 0258 05/29/22 0144  WBC 8.3 8.4 7.1  RBC 4.72 4.35 3.96*  HGB 13.8 12.7* 12.0*  HCT 42.2 39.0 35.0*  MCV 89.4 89.7 88.4  MCH 29.2 29.2 30.3  MCHC 32.7 32.6 34.3  RDW 14.8 14.6 14.5  PLT 209 187 180   Thyroid  Recent Labs  Lab 05/25/22 1740  TSH 0.716    BNPNo results for input(s): "BNP", "PROBNP" in the last 168 hours.  DDimer No results for input(s): "DDIMER" in the last 168 hours.   Radiology    CARDIAC CATHETERIZATION  Result Date: 05/28/2022   Prox Cx lesion is 90% stenosed.   Mid LM to Ost LAD lesion is 75% stenosed.   2nd Mrg-1 lesion is 95% stenosed.   2nd Mrg-2 lesion is 50% stenosed.   A drug-eluting stent was successfully placed using a STENT ONYX FRONTIER 2.5X30.   A stent was successfully placed.   A drug-eluting stent  was successfully placed using a STENT ONYX FRONTIER 3.5X18.   Post intervention, there is a 0% residual stenosis.   Post intervention, there is a 0% residual stenosis.   Post intervention, there is a 0% residual stenosis. Successful PCI of the left main, proximal LCx, OM to the mid vessel incorporating orbital atherectomy, Shockwave balloon lithotripsy and DES x 2 in overlapping fashion. Plan; DAPT with ASA for one month, Plavix for 12 months. May resume Coumadin today.   CARDIAC CATHETERIZATION  Addendum Date: 05/27/2022     Mid RCA lesion is 25% stenosed.   Mid LM to Ost LAD lesion is 75% stenosed.  Mid LAD lesion is 100% stenosed.  Prox LAD to Mid LAD lesion is 90% stenosed.  Patent LIMA to LAD.   Prox Cx lesion is 90% stenosed.   2nd Mrg-1 lesion is 95% stenosed.   Mid Cx lesion is 25% stenosed.   SVG to OM appears occluded.   2nd Mrg-2 lesion is 50% stenosed.   Mechanical aortic valve.   Calcified left radial artery noted on ultrasound. Severe native two-vessel coronary artery disease including the LAD and circumflex.  Patent LIMA to LAD.  Occluded SVG to OM.  Very complex distal left main, prox circ and prox OM calcified lesion involving several bifurcations.  Plan for surgical consult to evaluate for redo CABG.   Result Date: 05/27/2022   Mid RCA lesion is 25% stenosed.   Mid LM to Ost LAD lesion is 75% stenosed.  Mid LAD lesion is 100% stenosed.  Prox LAD to Mid LAD lesion is 90% stenosed.  Patent LIMA to LAD.   Prox Cx lesion is 90% stenosed.   2nd Mrg lesion is 95% stenosed.   Mid Cx lesion is 25% stenosed.   SVG to OM appears occluded.  Mechanical aortic valve. Severe native two-vessel coronary artery disease including the LAD and circumflex.  Patent LIMA to LAD.  Occluded SVG to OM.  Very complex distal left main, prox circ and prox OM calcified lesion involving several bifurcations.  Plan for surgical consult to evaluate for redo CABG.      Assessment & Plan    #NSTEMI #CAD S/p PCI  05/28/2022 - cont plavix, aspirin Cont metop and atorvastatin  #AS s/p bentall Cont coumadin per pharmacy No indication for bridging given no stroke history, no valve thrombosis history    For questions or updates, please contact [place] Please consult [email or web address] for contact info under        Signed, Vickie Epley, MD  05/29/2022, 10:19 AM

## 2022-05-31 ENCOUNTER — Telehealth: Payer: Self-pay | Admitting: *Deleted

## 2022-05-31 NOTE — Telephone Encounter (Signed)
Received a msg:  Strader, [place] M, PA-C  P Cv Div Ch St Anticoag Good morning,  This patient needs an INR check next week. He does have a visit with Richardson Dopp, PA on 11/22 if it can be performed at that time?  Thanks, Latty with wife, [name] in reference to this and pt will have an INR done at [facility] to prevent multiple appts at two different locations. Wife was appreciative and verbalized they will wait to have INR done after Scott's appt.

## 2022-06-01 NOTE — Progress Notes (Unsigned)
Cardiology Clinic Note   Patient Name: [name] Date of Encounter: 06/02/2022  Primary Care Provider:  [name], NP Primary Cardiologist:  Candee Furbish, MD  Patient Profile    [name] is a 76 year-old male with a past medical history of CAD s/p CABG x 2 LIMA to LAD and SVG to Cx in 2005, NSTEMI 05/25/2022 s/p PCI LM/proximal LCx/OM, aortic stenosis s/p Bentall aortic root replacement with StJ mechanical valve, complete heart block s/p PPM 2005, hypertension, hyperlipidemia, T2DM, GERD and Parkinson disease who presents to the clinic today for hospital follow-up of PCI.   Past Medical History    Past Medical History:  Diagnosis Date   Acute renal failure ([place]) 02/09/2013   Aortic stenosis 08/25/2010   a. Bentall aortic root replacement with a St. Jude mechanical valve and Hemashield conduit 02/2004.   Arthritis    Asthma    AV BLOCK, COMPLETE    a. s/p St Jude dual chamber pacemaker 02/2004.   CAD (coronary artery disease)    a. s/p CABGx2 (LIMA-dLAD, SVG-Cx). b. Low risk nuc 12/2012 without ischemia, EF 46% mild apical hypokinesia (EF 55% inf HK by echo).   Carotid artery disease (HCC)    a. 0-39% bilateral ICA stenosis, stable mild hard plaque in carotid bulbs. F/u 03/2014 recommended.   Diabetes mellitus without complication ([place])    type 2    DIVERTICULOSIS, COLON 10/17/2007   Ejection fraction    a. EF 55% with inf HK, mild MR by echo 12/2012.   GERD (gastroesophageal reflux disease)    hxof    GOUT 01/16/2007   Headache    hx of migraines    HEMORRHOIDS, INTERNAL 11/01/2008   Hx of adenomatous polyp of colon 02/2019   no recall needed   HYPERLIPIDEMIA 01/16/2007   HYPERTENSION 01/16/2007   Impaired glucose tolerance    LEG EDEMA 11/27/2008   Special screening for malignant neoplasm of prostate    Warfarin anticoagulation    AVR   Past Surgical History:  Procedure Laterality Date   AORTIC VALVE REPLACEMENT     CARDIAC CATHETERIZATION  06/2003   CIRCUMCISION      COLONOSCOPY     CORONARY ARTERY BYPASS GRAFT     CORONARY ATHERECTOMY N/A 05/28/2022   Procedure: CORONARY ATHERECTOMY;  Surgeon: Martinique, Peter M, MD;  Location: [place] [facility];  Service: Cardiovascular;  Laterality: N/A;   CORONARY LITHOTRIPSY N/A 05/28/2022   Procedure: CORONARY LITHOTRIPSY;  Surgeon: Martinique, Peter M, MD;  Location: [place] [facility];  Service: Cardiovascular;  Laterality: N/A;   CORONARY STENT INTERVENTION N/A 05/28/2022   Procedure: CORONARY STENT INTERVENTION;  Surgeon: Martinique, Peter M, MD;  Location: [place] [facility];  Service: Cardiovascular;  Laterality: N/A;   coronary stents      prior to bypass    EP IMPLANTABLE DEVICE N/A 05/06/2016   Procedure: PPM Generator Changeout;  Surgeon: Evans Lance, MD;  Location: [place] [facility];  Service: Cardiovascular;  Laterality: N/A;   ESOPHAGOGASTRODUODENOSCOPY     HERNIA REPAIR     INGUINAL HERNIA REPAIR Bilateral 02/07/2013   Procedure: LAPAROSCOPIC BILATERAL INGUINAL HERNIA REPAIR;  Surgeon: Imogene Burn. Georgette Dover, MD;  Location: [facility];  Service: General;  Laterality: Bilateral;   INSERTION OF MESH Bilateral 02/07/2013   Procedure: INSERTION OF MESH;  Surgeon: Imogene Burn. Georgette Dover, MD;  Location: [facility];  Service: General;  Laterality: Bilateral;   LEFT HEART CATH AND CORS/GRAFTS ANGIOGRAPHY N/A 05/27/2022  Procedure: LEFT HEART CATH AND CORS/GRAFTS ANGIOGRAPHY;  Surgeon: Jettie Booze, MD;  Location: [place] [facility];  Service: Cardiovascular;  Laterality: N/A;   LUMBAR LAMINECTOMY/DECOMPRESSION MICRODISCECTOMY N/A 03/15/2018   Procedure: Complete decompressive lumbar laminectomy for spinal stenosis of L4-L5 and foraminotomy for L4-L5 root on right;  Surgeon: [name], MD;  Location: [facility];  Service: Orthopedics;  Laterality: N/A;   PACEMAKER INSERTION     SHOULDER SURGERY Bilateral    SKIN BIOPSY  05/04/2019   BASAL CELL CARCINOMA//MID CENTRAL NASAL   SKIN BIOPSY  10/11/2019   Superficial basal cell  carcinoma - left superior central forehead shave    Allergies  Allergies  Allergen Reactions   Bee Venom Shortness Of Breath and Swelling   Codeine Phosphate Hives, Shortness Of Breath, Itching, Rash and Other (See Comments)    Tolerated multiple doses of IV morphine   Metoprolol Tartrate Other (See Comments)    loss of vision. Can take the XL tablet not regular    Pramipexole Other (See Comments)    Hallucinations     Tramadol     Pt has hallucinations    Ramipril Rash and Cough    History of Present Illness    [name] has a past medical history of: CAD. LHC 06/25/2003: DES to OM1. Distal LM 20%, Mid LAD 50%. Proximal RCA 30%. Aortic stenosis.  CABG August 2005: LIMA to LAD, SV Cx.  LHC 05/27/2022: Mid RCA 25%, mid LM to ostial LAD 75%, mid LAD 100%, proximal to mid LAD 90%, proximal Cx 90%, 2nd marginal #1 95%, 2nd marginal #2 50%, mid Cx 25%, SVG to OM occluded, patent LIMA to LAD. CTS consult. Staged LHC 05/28/2022: Successful PCI of left main, proximal Cx, OM to mid vessel incorporating orbital atherectomy, Shockwave balloon lithotripsy, and DES x 2 in overlapping fashion. DAPT with ASA x 1 month, plavix 12 months, resume coumadin.  Echo 05/26/2022: EF 50-55%. Low normal left ventricular function. Regional wall motion abnormalities. Mild LVH. Grade I DD. Normal structure/function of aortic valve prosthesis.  Aortic valve replacement.  Mechanical aortic valve August 2005.  Complete heart block s/p PPM August 2005.  Generator change out 05/06/2016: Successful removal of previously implanted dual chamber pacemaker and insertion of new St.J dual chamber pacemaker.  Remote device check 05/06/2022: Normal device function.  Thoracic aorta dilatation.  Echo 04/13/2021: EF 45-50%. Mildly decreased left ventricular function. Grade I DD. Mild mitral regurgitation. Mechanical AVR St.J with Bental stable low gradients in systole. Mild aortic valve regurgitation.  CTA chest aorta  05/03/2022: Unchanged fusiform aneurysmal dilation of the distal ascending aorta measuring up to 4.5 cm. Recommend semi-annual imaging follow-up.  Hypertension.  Hyperlipidemia.  Lipid panel 05/26/2022: LDL 79, HDL 48, TG 110, total cholesterol 149.  LPa 05/26/2022: 216  T2DM. GERD. Parkinson disease.    Mr Catalina Antigua is a long time patient of cardiology since 2004 for the above stated cardiac history. He was last seen in the office by Robbie Lis, PA-C on 05/10/2022. He was doing well at that time and all medications were continued.   Most recently, patient was admitted through the ED for chest pain radiating to left arm on 05/25/2022. Pain began the night before then resolved on its own an hour later. Pain returned in the morning and was relieved with nitro. Troponin positive x 4 143>>156>>133>>103. Patient was brought to the cath lab on 05/27/2022 where he was found to severe native two-vessel CAD including the LAD and LCx. SVG to  OM occluded. Very complex distal LM, proximal Cx, and proximal OM calcified lesion involving several bifurcations. CTS was consulted regarding redo CABG. CTS felt reoperative surgery would risk losing LIMA and recovery would be complicated by age and diagnosis of Parkinson disease. Staged PCI was performed on 05/28/2022 with successful PCI of LM, proximal LCx, and OM to mid vessel incorporating orbital arthrectomy, balloon lithotripsy, and overlapping DES x 2. Patient was discharged 05/29/2022.   Today, patient is accompanied by his wife. He reports two episodes of brief chest pain since discharge from hospital on 05/29/2022. The first episode was on the day he came home and the second was one day ago. He reports sudden, sharp central chest pain similar to pain that brought him to the hospital but not as intense. Pain lasted for 10-15 minutes and resolved with placing a heating pad on his chest. He attributes this pain to being cold. He denies associated shortness of breath and  reports mild nausea. He has been up walking a little bit but has mostly been sitting since getting discharged. He has lower extremity edema right > left that is normal for him. Discussed cardiac rehab with patient. He has bad knees and a bad shoulder that cannot be repaired secondary to Parkinson disease so he is hesitant to participate. His wife would like to talk more about it.    Home Medications    Current Meds  Medication Sig   Accu-Chek FastClix Lancets MISC USE TO CHECK BLOOD SUGAR TWICE A DAY AND AS NEEDED   acetaminophen (TYLENOL) 650 MG CR tablet Take 1,300 mg by mouth 2 (two) times daily.   albuterol (VENTOLIN HFA) 108 (90 Base) MCG/ACT inhaler INHALE 2 PUFFS INTO THE LUNGS EVERY 6 HOURS AS NEEDED FOR WHEEZING OR SHORTNESS OF BREATH.   Ascorbic Acid (VITAMIN C) 1000 MG tablet Take 1,000 mg by mouth daily.   aspirin 81 MG tablet Take 81 mg by mouth every evening.    atorvastatin (LIPITOR) 80 MG tablet Take 1 tablet (80 mg total) by mouth daily.   Blood Glucose Monitoring Suppl (ACCU-CHEK GUIDE) w/Device KIT Use as directed   calcium carbonate (OS-CAL) 600 MG TABS Take 600 mg by mouth 2 (two) times daily with a meal.   carbidopa-levodopa (SINEMET CR) 50-200 MG tablet Take 1 tablet by mouth at bedtime.   carbidopa-levodopa (SINEMET IR) 25-100 MG tablet Take 1 tablet by mouth 4 (four) times daily.   cetirizine (ZYRTEC) 10 MG tablet Take 10 mg by mouth daily.    cholecalciferol (VITAMIN D) 25 MCG (1000 UNIT) tablet Take 1,000 Units by mouth daily.   clopidogrel (PLAVIX) 75 MG tablet Take 1 tablet (75 mg total) by mouth daily.   fluticasone (FLONASE) 50 MCG/ACT nasal spray Place 2 sprays into both nostrils daily.   furosemide (LASIX) 40 MG tablet TAKE 1 TABLET EVERY DAY   glucose blood (ACCU-CHEK SMARTVIEW) test strip Use as instructed   losartan (COZAAR) 100 MG tablet TAKE 1 TABLET EVERY DAY (PLEASE MAKE OVERDUE APPT WITH DR. Lovena Le BEFORE ANY MORE REFILLS, 2ND ATTEMPT, CALL  [phone])   melatonin 5 MG TABS Take 5 mg by mouth at bedtime.   metFORMIN (GLUCOPHAGE) 500 MG tablet TAKE 1/2 TABLET EVERY DAY WITH BREAKFAST - SCHEDULE APPT FOR FUTURE REFILLS   metoprolol succinate (TOPROL-XL) 100 MG 24 hr tablet TAKE 1 TABLET EVERY DAY   Misc Natural Products (TART CHERRY ADVANCED) CAPS Take 2 capsules by mouth daily.    Multiple Vitamin (MULTIVITAMIN) tablet Take 1  tablet by mouth daily.     nitroGLYCERIN (NITROSTAT) 0.4 MG SL tablet Place 1 tablet (0.4 mg total) under the tongue every 5 (five) minutes as needed. For chest pain   potassium chloride (KLOR-CON) 10 MEQ tablet TAKE 1 TABLET EVERY DAY   Probiotic Product (PHILLIPS COLON HEALTH PO) Take 1 capsule by mouth daily.   vitamin B-12 (CYANOCOBALAMIN) 1000 MCG tablet Take 1,000 mcg by mouth daily.   warfarin (COUMADIN) 10 MG tablet TAKE 1/2 TO 1 TABLET DAILY OR AS DIRECTED BY COUMADIN CLINIC    Family History    Family History  Problem Relation Age of Onset   Heart attack Father    Kidney cancer Father    Hypertension Mother    Diabetes Mother    Diabetes Brother    Epilepsy Sister    Diabetes Sister    Diabetes Brother    Colon cancer Neg Hx    Rectal cancer Neg Hx    Stomach cancer Neg Hx    Esophageal cancer Neg Hx    He indicated that his mother is deceased. He indicated that his father is deceased. He indicated that both of his sisters are alive. He indicated that only one of his two brothers is alive. He indicated that his maternal grandmother is deceased. He indicated that his maternal grandfather is deceased. He indicated that his paternal grandmother is deceased. He indicated that his paternal grandfather is deceased. He indicated that the status of his neg hx is unknown.   Social History    Social History   Socioeconomic History   Marital status: Married    Spouse name: Not on file   Number of children: 3   Years of education: Not on file   Highest education level: Not on file   Occupational History   Occupation: retired  Tobacco Use   Smoking status: Former    Types: Cigarettes    Quit date: 07/12/1973    Years since quitting: 48.9   Smokeless tobacco: Never  Vaping Use   Vaping Use: Never used  Substance and Sexual Activity   Alcohol use: No   Drug use: No   Sexual activity: Not on file  Other Topics Concern   Not on file  Social History Narrative   The patient is married and retired   He reports 3 grown children   Former smoker, no alcohol or tobacco or drug use at this time   Social Determinants of Radio broadcast assistant Strain: [place]  (06/16/2021)   Overall Financial Resource Strain (CARDIA)    Difficulty of Paying Living Expenses: Not hard at all  Food Insecurity: No Food Insecurity (05/26/2022)   Hunger Vital Sign    Worried About Running Out of Food in the Last Year: Never true    [place] in the Last Year: Never true  Transportation Needs: No Transportation Needs (05/26/2022)   PRAPARE - Hydrologist (Medical): No    Lack of Transportation (Non-Medical): No  Physical Activity: Unknown (09/15/2021)   Exercise Vital Sign    Days of Exercise per Week: Patient refused    Minutes of Exercise per Session: 60 min  Stress: No Stress Concern Present (09/15/2021)   [place]    Feeling of Stress : Not at all  Social Connections: [place] (09/15/2021)   Social Connection and Isolation Panel [NHANES]    Frequency of Communication with Friends and  Family: More than three times a week    Frequency of Social Gatherings with Friends and Family: More than three times a week    Attends Religious Services: More than 4 times per year    Active Member of [employer] or Organizations: No    Attends Music therapist: More than 4 times per year    Marital Status: Married  Human resources officer Violence: Not At Risk (05/26/2022)   Humiliation,  Afraid, Rape, and Kick questionnaire    Fear of Current or Ex-Partner: No    Emotionally Abused: No    Physically Abused: No    Sexually Abused: No     Review of Systems    General:  No chills, fever, night sweats or weight changes.  Cardiovascular:  No dyspnea on exertion, orthopnea, palpitations, paroxysmal nocturnal dyspnea. Two episodes of stabbing central chest pain since hospital discharge. Mild lower extremity edema.  Dermatological: No rash, lesions/masses Respiratory: No cough, dyspnea Urologic: No hematuria, dysuria Abdominal:   No nausea, vomiting, diarrhea, bright red blood per rectum, melena, or hematemesis Neurologic:  No visual changes, weakness, changes in mental status. All other systems reviewed and are otherwise negative except as noted above.  Physical Exam    VS:  BP (!) 110/56   Pulse 84   Ht _0  (1.676 m)   Wt 205 lb 6.4 oz (93.2 kg)   SpO2 96%   BMI 33.15 kg/m  , BMI Body mass index is 33.15 kg/m. GEN:  Well nourished, well developed, in no acute distress. HEENT: Normal. Neck: Supple, no JVD, carotid bruits, or masses. Cardiac: RRR, no murmurs, rubs, or gallops. Mechanical S2 click. No clubbing, cyanosis.  Radials/DP/PT 2+ and equal bilaterally. 2+ edema right lower extremity, 1+ left lower extremity. Compression socks in place.  Respiratory:  Respirations regular and unlabored, clear to auscultation bilaterally. GI: Soft, nontender, nondistended. MS: No deformity or atrophy. Skin: Warm and dry, no rash. Neuro: Strength and sensation are intact. Psych: Normal affect.  Accessory Clinical Findings    The following studies were reviewed for this visit: CTA Chest Aorta 05/03/2022: IMPRESSION: Vascular:   1. Unchanged fusiform aneurysmal dilation of the distal ascending thoracic aorta measuring up to 4.5 cm. Recommend semi-annual imaging followup by CTA or MRA and referral to cardiothoracic surgery if not already obtained. This recommendation  follows 2010 ACCF/AHA/AATS/ACR/ASA/SCA/SCAI/SIR/STS/SVM Guidelines for the Diagnosis and Management of Patients With Thoracic Aortic Disease. Circulation. 2010; 121: E100-F121. Aortic aneurysm NOS (ICD10-I71.9) 2. Similar appearing postsurgical changes after aortic root repair and coronary artery bypass graft without complicating features. 3.  Aortic Atherosclerosis (ICD10-I70.0).   Non-Vascular:   1. No acute intrathoracic abnormality. 2. Hepatic steatosis. 3. Similar appearing evidence of prior granulomatous disease.  Recent Labs: 05/25/2022: ALT 10; Magnesium 2.0; TSH 0.716 05/29/2022: BUN 14; Creatinine, Ser 0.84; Hemoglobin 12.0; Platelets 180; Potassium 3.9; Sodium 136   Recent Lipid Panel    Component Value Date/Time   CHOL 149 05/26/2022 0835   TRIG 110 05/26/2022 0835   TRIG 91 06/14/2006 0825   HDL 48 05/26/2022 0835   CHOLHDL 3.1 05/26/2022 0835   VLDL 22 05/26/2022 0835   LDLCALC 79 05/26/2022 0835         ECG personally reviewed by me today: A sensed, V paced rhythm with prolonged AV conduction, HR 84.  No significant changes from 05/28/2022.      Assessment & Plan   CAD s/p PCI 05/28/2022. Patient underwent successful PCI orbital arthrectomy of OM/proximal LCx/LM, shockwave balloon  lithotripsy to OM, DES from OM to proximal LCx and overlapping DES LM. Patient will be on DAPT with aspirin x 1 month then Plavix x 12 months. Coumadin was restarted following PCI. Patient reports two episodes of brief stabbing, central chest pain since discharge from hospital while at rest. He did not need to take NTG. Pain resolved after 10-15 minutes with heating pad to chest. No associated shortness of breath. Patient states pain feels similar to pain that brought him to the hospital but also states that he had this pain previously when he was cold. Question if this is reprofusion pain vs. anginal pain. He is instructed to continue to monitor symptoms and call office if he has more  episodes of chest pain. Patient and wife are in agreement with plan. Patient and wife understand he will continue aspirin and plavix for one month and then plavix only for one year. Patient and wife would like more information about cardiac rehab. Not sure if he wants to participate secondary to shoulder and knee pain.    Cardiac Rehabilitation Eligibility Assessment  The patient is ready to start cardiac rehabilitation from a cardiac standpoint.     Cardiac Rehabilitation Eligibility Assessment     Aortic valve replacement. Echo 11/17\11/2021 showed normal structure/function of aortic valve prosthesis. Mechanical S2 click heard on exam. Patient denies shortness of breath or increased lower extremity edema. Patient's INR 1.4 today. He is instructed to follow directives from anti-coag clinic.  Complete heart block s/p PPM. Last remote device check 05/06/2022 showed normal function. Next remote check January 2024.  Hypertension. BP today 110/56. Patient denies headaches or dizziness. Continue metoprolol and losartan.  Hyperlipidemia. Lipid panel 05/26/2022 LDL 79, not at goal. LPa 216. Atorvastatin increased to 80 mg daily. Will recheck lipid panel and LFTs when patient returns to office.    Disposition: Return in 6-8 weeks or sooner as needed. Fasting lipid panel and LFTs when he returns.    Justice Britain. Trysten Berti, NP-C     06/02/2022, 2:20 PM [employer] [address] Office [phone] Fax [phone]   I spent 14 minutes examining this patient, reviewing medications, and using patient centered shared decision making involving her cardiac care.  Prior to her visit I spent greater than 20 minutes reviewing her past medical history,  medications, and prior cardiac tests.

## 2022-06-02 ENCOUNTER — Ambulatory Visit (INDEPENDENT_AMBULATORY_CARE_PROVIDER_SITE_OTHER): Payer: Medicare HMO | Admitting: Pharmacist

## 2022-06-02 ENCOUNTER — Ambulatory Visit: Payer: Medicare HMO | Attending: Physician Assistant | Admitting: Student

## 2022-06-02 ENCOUNTER — Telehealth: Payer: Self-pay

## 2022-06-02 ENCOUNTER — Encounter: Payer: Self-pay | Admitting: Student

## 2022-06-02 VITALS — BP 110/56 | HR 84 | Ht 66.0 in | Wt 205.4 lb

## 2022-06-02 DIAGNOSIS — I5032 Chronic diastolic (congestive) heart failure: Secondary | ICD-10-CM | POA: Diagnosis not present

## 2022-06-02 DIAGNOSIS — E785 Hyperlipidemia, unspecified: Secondary | ICD-10-CM

## 2022-06-02 DIAGNOSIS — I10 Essential (primary) hypertension: Secondary | ICD-10-CM | POA: Diagnosis not present

## 2022-06-02 DIAGNOSIS — Z952 Presence of prosthetic heart valve: Secondary | ICD-10-CM | POA: Diagnosis not present

## 2022-06-02 DIAGNOSIS — Z950 Presence of cardiac pacemaker: Secondary | ICD-10-CM

## 2022-06-02 DIAGNOSIS — Z5181 Encounter for therapeutic drug level monitoring: Secondary | ICD-10-CM

## 2022-06-02 DIAGNOSIS — I442 Atrioventricular block, complete: Secondary | ICD-10-CM

## 2022-06-02 DIAGNOSIS — I2510 Atherosclerotic heart disease of native coronary artery without angina pectoris: Secondary | ICD-10-CM

## 2022-06-02 LAB — POCT INR: INR: 1.4 — AB (ref 2.0–3.0)

## 2022-06-02 NOTE — Patient Outreach (Signed)
  Care Coordination TOC Note Transition Care Management Unsuccessful Follow-up Telephone Call  Date of discharge and from where:  05/29/22-[facility]  Attempts:  1st Attempt  Reason for unsuccessful TCM follow-up call:  No answer/busy    Enzo Montgomery, RN,BSN,CCM [facility] Telephonic Care Management Coordinator Direct Phone: [phone] Toll Free: [phone] Fax: [phone]

## 2022-06-02 NOTE — Patient Instructions (Signed)
Medication Instructions:  Your physician recommends that you continue on your current medications as directed. Please refer to the Current Medication list given to you today.  *If you need a refill on your cardiac medications before your next appointment, please call your pharmacy*   Lab Work: None ordered  If you have labs (blood work) drawn today and your tests are completely normal, you will receive your results only by: [place] (if you have MyChart) OR A paper copy in the mail If you have any lab test that is abnormal or we need to change your treatment, we will call you to review the results.   Testing/Procedures: None ordered   Follow-Up: At [facility], you and your health needs are our priority.  As part of our continuing mission to provide you with exceptional heart care, we have created designated Provider Care Teams.  These Care Teams include your primary Cardiologist (physician) and Advanced Practice Providers (APPs -  Physician Assistants and Nurse Practitioners) who all work together to provide you with the care you need, when you need it.  We recommend signing up for the patient portal called "MyChart".  Sign up information is provided on this After Visit Summary.  MyChart is used to connect with patients for Virtual Visits (Telemedicine).  Patients are able to view lab/test results, encounter notes, upcoming appointments, etc.  Non-urgent messages can be sent to your provider as well.   To learn more about what you can do with MyChart, go to [email or web address].    Your next appointment:   6-8 week(s)  The format for your next appointment:   In Person  Provider:   Candee Furbish, MD  or Richardson Dopp, PA-C         Other Instructions   Important Information About Sugar

## 2022-06-02 NOTE — Patient Instructions (Signed)
Description   Take 1.5 tablets today and 1 tablet tomorrow, then continue taking warfarin 1 tablet daily except for 1/2 tablet on Sundays, Tuesdays, and Thursdays. Remain consistent with green leafy veggies. Recheck INR in 1 week. Coumadin Clinic [phone]

## 2022-06-07 ENCOUNTER — Telehealth: Payer: Self-pay | Admitting: *Deleted

## 2022-06-07 ENCOUNTER — Telehealth: Payer: Self-pay

## 2022-06-07 ENCOUNTER — Other Ambulatory Visit: Payer: Self-pay | Admitting: *Deleted

## 2022-06-07 DIAGNOSIS — E785 Hyperlipidemia, unspecified: Secondary | ICD-10-CM

## 2022-06-07 NOTE — Telephone Encounter (Signed)
Pt and wife both have been made aware pt needed to have some fasting blood work done. Orders have been put in / released for pt to have at the [place] office when he goes for his Coumadin appt 06/10/22.

## 2022-06-07 NOTE — Patient Outreach (Signed)
  Care Coordination TOC Note Transition Care Management Follow-up Telephone Call Date of discharge and from where: 05/29/22-[facility] Dx: NSTEMI How have you been since you were released from the hospital? Patient reports he has had good and bad days but overall doing okay. Denies any CP or cardiac issues. Any questions or concerns? No  Items Reviewed: Did the pt receive and understand the discharge instructions provided? Yes  Medications obtained and verified? Yes  Other? Yes  Any new allergies since your discharge? No  Dietary orders reviewed? Yes Do you have support at home? Yes   Home Care and Equipment/Supplies: Were home health services ordered? not applicable If so, what is the name of the agency? N/A  Has the agency set up a time to come to the patient's home? not applicable Were any new equipment or medical supplies ordered?  No What is the name of the medical supply agency? N/A Were you able to get the supplies/equipment? not applicable Do you have any questions related to the use of the equipment or supplies? No  Functional Questionnaire: (I = Independent and D = Dependent) ADLs: I  Bathing/Dressing- I  Meal Prep- I  Eating- I  Maintaining continence- I  Transferring/Ambulation- I  Managing Meds- I  Follow up appointments reviewed:  PCP Hospital f/u appt confirmed? Yes  Scheduled to see PCP on 06/17/22 @ [facility] f/u appt confirmed? Yes  Saw Margaret Pyle on 06/02/22 Are transportation arrangements needed? No  If their condition worsens, is the pt aware to call PCP or go to the Emergency Dept.? Yes Was the patient provided with contact information for the PCP's office or ED? Yes Was to pt encouraged to call back with questions or concerns? Yes  SDOH assessments and interventions completed:   Yes  Care Coordination Interventions Activated:  Yes   Care Coordination Interventions:  Education provided    Encounter Outcome:  Pt. Visit  Completed    Enzo Montgomery, RN,BSN,CCM [facility] Telephonic Care Management Coordinator Direct Phone: [phone] Toll Free: [phone] Fax: [phone]

## 2022-06-07 NOTE — Telephone Encounter (Signed)
-----   Message from Mayra Reel, NP sent at 06/02/2022  2:33 PM EST ----- Regarding: Labs Patient needs fasting lipid panel and LFTs at next office visit. Will you let the patient know?  Thank you!!  DW

## 2022-06-09 ENCOUNTER — Telehealth: Payer: Self-pay

## 2022-06-09 NOTE — Progress Notes (Signed)
  Chronic Care Management   Note  06/09/2022 Name: LEANDRE WIEN MRN: [id number] DOB: December 27, 1945  Verland Sprinkle Clugston is a 76 y.o. year old male who is a primary care patient of [name], NP. I reached out to June Vacha Jaskulski by phone today in response to a referral sent by Mr. Celester Lech Green's PCP.  Mr. Orestes Geiman Krichbaum  agreedto scheduling an appointment with the CCM RN Case Manager   Follow up plan: Patient agreed to scheduled appointment with RN Case Manager on 06/11/2022(date/time).   Noreene Larsson, [place], [place] [address] Direct Dial: [phone] [email or web address]

## 2022-06-10 ENCOUNTER — Ambulatory Visit: Payer: Medicare HMO | Attending: Cardiology

## 2022-06-10 DIAGNOSIS — Z09 Encounter for follow-up examination after completed treatment for conditions other than malignant neoplasm: Secondary | ICD-10-CM | POA: Diagnosis not present

## 2022-06-10 DIAGNOSIS — Z952 Presence of prosthetic heart valve: Secondary | ICD-10-CM

## 2022-06-10 DIAGNOSIS — Z5181 Encounter for therapeutic drug level monitoring: Secondary | ICD-10-CM | POA: Diagnosis not present

## 2022-06-10 DIAGNOSIS — E785 Hyperlipidemia, unspecified: Secondary | ICD-10-CM | POA: Diagnosis not present

## 2022-06-10 DIAGNOSIS — Z7901 Long term (current) use of anticoagulants: Secondary | ICD-10-CM

## 2022-06-10 LAB — HEPATIC FUNCTION PANEL
ALT: 18 IU/L (ref 0–44)
AST: 28 IU/L (ref 0–40)
Albumin: 4.5 g/dL (ref 3.8–4.8)
Alkaline Phosphatase: 65 IU/L (ref 44–121)
Bilirubin Total: 0.8 mg/dL (ref 0.0–1.2)
Bilirubin, Direct: 0.22 mg/dL (ref 0.00–0.40)
Total Protein: 6.4 g/dL (ref 6.0–8.5)

## 2022-06-10 LAB — LIPID PANEL
Chol/HDL Ratio: 2.9 ratio (ref 0.0–5.0)
Cholesterol, Total: 132 mg/dL (ref 100–199)
HDL: 46 mg/dL (ref >39)
LDL Chol Calc (NIH): 63 mg/dL (ref 0–99)
Triglycerides: 128 mg/dL (ref 0–149)
VLDL Cholesterol Cal: 23 mg/dL (ref 5–40)

## 2022-06-10 LAB — POCT INR: INR: 3.8 — AB (ref 2.0–3.0)

## 2022-06-10 NOTE — Patient Instructions (Signed)
Description   HOLD today's dose and then continue taking warfarin 1 tablet daily except for 1/2 tablet on Sundays, Tuesdays, and Thursdays. Remain consistent with green leafy veggies.  Recheck INR in 2 weeks.  [facility] [phone]

## 2022-06-11 ENCOUNTER — Ambulatory Visit (INDEPENDENT_AMBULATORY_CARE_PROVIDER_SITE_OTHER): Payer: Medicare HMO

## 2022-06-11 ENCOUNTER — Telehealth: Payer: Self-pay | Admitting: Pharmacist

## 2022-06-11 DIAGNOSIS — E119 Type 2 diabetes mellitus without complications: Secondary | ICD-10-CM

## 2022-06-11 DIAGNOSIS — I10 Essential (primary) hypertension: Secondary | ICD-10-CM

## 2022-06-11 NOTE — Chronic Care Management (AMB) (Unsigned)
  Chronic Care Management   CCM RN Visit Note  06/11/2022 Name: [name] MRN: [id number] DOB: 1946-03-13  Subjective: [name] is a 76 y.o. year old male who is a primary care patient of [name], NP. The patient was referred to the Chronic Care Management team for assistance with care management needs subsequent to provider initiation of CCM services and plan of care.    Today's Visit:  {CCMTELEPHONEFACETOFACE:21091510} for {CCMINITIALFOLLOWUPCHOICE:21091511}.        Goals Addressed             This Visit's Progress    Patient Stated       Patient Stated          Plan:{CM FOLLOW UP HXTA:56979}  SIG***

## 2022-06-11 NOTE — Patient Instructions (Signed)
Thank you for allowing the Chronic Care Management team to participate in your care.    Following is a copy of your full provider care plan:   Goals Addressed             This Visit's Progress    Goal: CCM (Diabetes) Expected Outcome: Monitor, Self-Manage and Reduce Symptoms of Diabetes       Current Barriers:  Chronic Disease Management support and education needs related to Diabetes  Planned Interventions: Reviewed plan for diabetes management. Reviewed medications. During previous PCP visit, metformin was decreased from '500mg'$  to 250 mg. Reports taking medications as prescribed.  Provided information regarding importance of consistent blood glucose monitoring. Reports monitoring as advise and maintaining a log. Reports fasting readings have been within range from the 90's to 110. Reading today was 99 mg/dl.  Reviewed s/sx of hypoglycemia and hyperglycemia along with recommended interventions.  Discussed nutritional intake and importance of complying with a diabetic diet. Reports adhering to recommended diet. Spouse reports monitoring intake of carbs and added sugars.  Discussed importance of completing recommended DM preventive care. Reports adhering to recommendations regarding daily foot care. Reports exams are completed by provider. Eye exam is up to date.  Discussed importance of completing ordered labs as prescribed.  Assessed social determinant of health barriers.   Lab Results  Component Value Date   HGBA1C 6.4 (H) 05/25/2022    Symptom Management: Take medications as prescribed   Attend all scheduled provider appointments Call pharmacy for medication refills 3-7 days in advance of running out of medications Call provider office for new concerns or questions  Continue monitoring blood sugars daily and maintain a log. Wash and dry feet carefully every day Check feet daily for cuts, sores or redness Trim toenails straight across Continue wearing comfortable, well-fitting  shoes Continue adhering to recommended ADA diet.  Follow Up Plan:   Will follow up next month       Goal: CCM (Hypertension) Expected Outcome: Monitor, Self-Manage and Reduce Symptoms of Hypertension       Current Barriers:  Chronic Disease Management support and education needs related to Hypertension Management  Planned Interventions: Reviewed medications and indications for use. Reports taking all medications as prescribed. Reports spouse is currently assisting with medication management. Denies current concerns r/t prescription cost. Provided information regarding established blood pressure parameters along with indications for notifying a provider. Currently monitoring BP every other day and maintaining a log. Reports recent systolic readings have ranged from 106 to 113. Reports diastolic readings have ranged in the 60s and 70s.  Discussed recent hospitalization from 11/14-11/18/23. Patient was admitted for chest pain and treated for NSTEMI. Reports doing well since discharge. Currently does not require home health services. Reports cardiac rehab was offered but he declined d/t knee and joint pain. Reports engaging in regular low impact walking since discharge. Denies episodes of chest discomfort, palpitations, or shortness of breath since discharge. Discussed compliance with recommended cardiac prudent diet. Spouse reports he is doing very well with nutritional intake. Reports consuming mostly bland diet since hospital discharge. Reports adhering to low sodium, low carbohydrate recommendations.  Encouraged to continue reading nutrition labels, monitoring sodium intake and avoid highly processed foods when possible. Reviewed pending appointments. Will complete PCP follow up on 06/17/22. Will complete outreach with the Coumadin clinic on 06/21/22. Will complete follow up with the Cardiology team on 07/20/21. Reviewed s/sx of heart attack, stroke and worsening symptoms that require immediate medical  attention.   BP Readings from  Last 3 Encounters:  06/02/22 (!) 110/56  05/29/22 (!) 144/67  05/10/22 132/72    Symptom Management: Take medications as prescribed   Attend all scheduled provider appointments Call pharmacy for medication refills 3-7 days in advance of running out of medications Continue monitoring BP and maintain a log Call doctor for signs and symptoms of high blood pressure Report new symptoms to your doctor Continue engaging in low impact activity as tolerated and use assistive device. Attend Cardiology follow up as scheduled.   Follow Up Plan:   Will follow up next month            Mr. Kniskern and spouse verbalized understanding of instructions and care plan provided today. Agree to view in MyChart.   A member of the care management team will follow up next month.  Horris Latino RN Care Manager/Chronic Care Management [phone]

## 2022-06-11 NOTE — Chronic Care Management (AMB) (Unsigned)
Chronic Care Management Pharmacy Assistant   Name: [name]  MRN: [id number] DOB: 1945/08/03  Reason for Encounter: Hospital Follow up  Recent consult visits:  06/02/2022 Mayra Reel NP(cardiology) - Patient was seen for Coronary artery disease involving native coronary artery of native heart without angina pectoris and additional concerns. No medication changes. Follow up in 6-8 weeks.   Hospital visits:  Admitted to [facility] on 05/25/2022 due to NSTEMI (non-ST elevated myocardial infarction) . Discharge date was 05/29/2022.   New?Medications Started at [facility] Discharge:?? Clopidogrel 75 mg daily  Medication Changes at Hospital Discharge: Atorvastatin to 80 mg daily  Medications Discontinued at Hospital Discharge: No medications discontinued  Medications that remain the same after Hospital Discharge:??  -All other medications will remain the same.    Medications: Outpatient Encounter Medications as of 06/11/2022  Medication Sig   Accu-Chek FastClix Lancets MISC USE TO CHECK BLOOD SUGAR TWICE A DAY AND AS NEEDED   acetaminophen (TYLENOL) 650 MG CR tablet Take 1,300 mg by mouth 2 (two) times daily.   albuterol (VENTOLIN HFA) 108 (90 Base) MCG/ACT inhaler INHALE 2 PUFFS INTO THE LUNGS EVERY 6 HOURS AS NEEDED FOR WHEEZING OR SHORTNESS OF BREATH.   Ascorbic Acid (VITAMIN C) 1000 MG tablet Take 1,000 mg by mouth daily.   aspirin 81 MG tablet Take 81 mg by mouth every evening.    atorvastatin (LIPITOR) 80 MG tablet Take 1 tablet (80 mg total) by mouth daily.   Blood Glucose Monitoring Suppl (ACCU-CHEK GUIDE) w/Device KIT Use as directed   calcium carbonate (OS-CAL) 600 MG TABS Take 600 mg by mouth 2 (two) times daily with a meal.   carbidopa-levodopa (SINEMET CR) 50-200 MG tablet Take 1 tablet by mouth at bedtime.   carbidopa-levodopa (SINEMET IR) 25-100 MG tablet Take 1 tablet by mouth 4 (four) times daily.   cetirizine (ZYRTEC) 10 MG tablet  Take 10 mg by mouth daily.    cholecalciferol (VITAMIN D) 25 MCG (1000 UNIT) tablet Take 1,000 Units by mouth daily.   clopidogrel (PLAVIX) 75 MG tablet Take 1 tablet (75 mg total) by mouth daily.   fluticasone (FLONASE) 50 MCG/ACT nasal spray Place 2 sprays into both nostrils daily.   furosemide (LASIX) 40 MG tablet TAKE 1 TABLET EVERY DAY   glucose blood (ACCU-CHEK SMARTVIEW) test strip Use as instructed   losartan (COZAAR) 100 MG tablet TAKE 1 TABLET EVERY DAY (PLEASE MAKE OVERDUE APPT WITH DR. Lovena Le BEFORE ANY MORE REFILLS, 2ND ATTEMPT, CALL [phone])   melatonin 5 MG TABS Take 5 mg by mouth at bedtime.   metFORMIN (GLUCOPHAGE) 500 MG tablet TAKE 1/2 TABLET EVERY DAY WITH BREAKFAST - SCHEDULE APPT FOR FUTURE REFILLS   metoprolol succinate (TOPROL-XL) 100 MG 24 hr tablet TAKE 1 TABLET EVERY DAY   Misc Natural Products (TART CHERRY ADVANCED) CAPS Take 2 capsules by mouth daily.    Multiple Vitamin (MULTIVITAMIN) tablet Take 1 tablet by mouth daily.     nitroGLYCERIN (NITROSTAT) 0.4 MG SL tablet Place 1 tablet (0.4 mg total) under the tongue every 5 (five) minutes as needed. For chest pain   potassium chloride (KLOR-CON) 10 MEQ tablet TAKE 1 TABLET EVERY DAY   Probiotic Product (PHILLIPS COLON HEALTH PO) Take 1 capsule by mouth daily.   vitamin B-12 (CYANOCOBALAMIN) 1000 MCG tablet Take 1,000 mcg by mouth daily.   warfarin (COUMADIN) 10 MG tablet TAKE 1/2 TO 1 TABLET DAILY OR AS DIRECTED BY COUMADIN CLINIC   No facility-administered  encounter medications on file as of 06/11/2022.   Notes:  Spoke with patient, he states he is feeling pretty good, slow to recover but making progress.  He denies any current issues or concerns and is not having any problems with his medications. Overall he states is feeling pretty good.   Care Gaps: AWV - scheduled 06/17/2022 Last BP - 110/56 on 06/02/2022 Last A1C - 6.4 on 05/25/2022 Covid - never done Urine ACR - overdue Foot exam - overdue AWV - due  soon  Star Rating Drugs: Atorvastatin 80 mg - last filled 05/29/2022 90 DS at [facility] Losartan 100 mg - last filled 04/19/2022 90 DS at [facility] Metformin 500 mg - last filled 04/15/2022 90 DS at [place] [phone]

## 2022-06-11 NOTE — Plan of Care (Unsigned)
Chronic Care Management Provider Comprehensive Care Plan    06/11/2022 Name: [name] MRN: [id number] DOB: [birth year]  Referral to Chronic Care Management (CCM) services was placed by Provider:  [name], NP on Date: 05/18/22.  Chronic Condition 1: HTN Provider Assessment and Plan   Essential hypertension - Well controlled. No change in medication    Expected Outcome/Goals Addressed This Visit (Provider CCM goals/Provider Assessment and plan  Goal: CCM (Hypertension) Expected Outcome: Monitor, Self-Manage and Reduce Symptoms of Hypertension  Symptom Management Condition 1: Take medications as prescribed   Attend all scheduled provider appointments Call pharmacy for medication refills 3-7 days in advance of running out of medications Continue monitoring BP and maintain a log Call doctor for signs and symptoms of high blood pressure Report new symptoms to your doctor Continue engaging in low impact activity as tolerated and use assistive device. Attend Cardiology follow up as scheduled.   Chronic Condition 2: DM Provider Assessment and Plan  Diabetes mellitus without complication (HCC) - POC HgB A1c- 6.0  - Will decrease metformin to 250 mg daily  - Follow up in 3 months for CPE    Expected Outcome/Goals Addressed This Visit (Provider CCM goals/Provider Assessment and plan  Goal: CCM (Diabetes) Expected Outcome: Monitor, Self-Manage and Reduce Symptoms of Diabetes  Symptom Management Condition 2: Take medications as prescribed   Attend all scheduled provider appointments Call pharmacy for medication refills 3-7 days in advance of running out of medications Call provider office for new concerns or questions  Continue monitoring blood sugars daily and maintain a log. Wash and dry feet carefully every day Check feet daily for cuts, sores or redness Trim toenails straight across Continue wearing comfortable, well-fitting shoes Continue adhering to recommended ADA  diet.  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 11/57/2620   Diabetes mellitus without complication ([place]) 35/59/7416   Encounter for therapeutic drug monitoring 08/08/2013   Aortic valve replaced 02/21/2013   Mitral regurgitation 01/21/2013   Recurrent right inguinal hernia 01/02/2013   Left inguinal hernia 01/02/2013   Carotid artery disease (HCC)    Warfarin anticoagulation    Hx of CABG    Aortic stenosis 08/25/2010   Hx of aortic valve replacement, mechanical 05/23/2009   AV BLOCK, COMPLETE 11/01/2008   PACEMAKER, PERMANENT 11/01/2008   GOUT 01/16/2007   CAD (coronary artery disease) 01/16/2007   Hyperlipidemia LDL goal <70 01/16/2007   Essential hypertension 01/16/2007    Medication Management  Current Outpatient Medications:    Accu-Chek FastClix Lancets MISC, USE TO CHECK BLOOD SUGAR TWICE A DAY AND AS NEEDED, Disp: 306 each, Rfl: 2   acetaminophen (TYLENOL) 650 MG CR tablet, Take 1,300 mg by mouth 2 (two) times daily., Disp: , Rfl:    albuterol (VENTOLIN HFA) 108 (90 Base) MCG/ACT inhaler, INHALE 2 PUFFS INTO THE LUNGS EVERY 6 HOURS AS NEEDED FOR WHEEZING OR SHORTNESS OF BREATH., Disp: 3 each, Rfl: 0   Ascorbic Acid (VITAMIN C) 1000 MG tablet, Take 1,000 mg by mouth daily., Disp: , Rfl:    aspirin 81 MG tablet, Take 81 mg by mouth every evening. , Disp: , Rfl:    atorvastatin (LIPITOR) 80 MG tablet, Take 1 tablet (80 mg total)  by mouth daily., Disp: 90 tablet, Rfl: 1   Blood Glucose Monitoring Suppl (ACCU-CHEK GUIDE) w/Device KIT, Use as directed, Disp: 1 kit, Rfl: 0   calcium carbonate (OS-CAL) 600 MG TABS, Take 600 mg by mouth 2 (two) times daily  with a meal., Disp: , Rfl:    carbidopa-levodopa (SINEMET CR) 50-200 MG tablet, Take 1 tablet by mouth at bedtime., Disp: , Rfl:    carbidopa-levodopa (SINEMET IR) 25-100 MG tablet, Take 1 tablet by mouth 4 (four) times daily., Disp: , Rfl:    cetirizine (ZYRTEC) 10 MG tablet, Take 10 mg by mouth daily. , Disp: , Rfl:    cholecalciferol (VITAMIN D) 25 MCG (1000 UNIT) tablet, Take 1,000 Units by mouth daily., Disp: , Rfl:    clopidogrel (PLAVIX) 75 MG tablet, Take 1 tablet (75 mg total) by mouth daily., Disp: 90 tablet, Rfl: 1   fluticasone (FLONASE) 50 MCG/ACT nasal spray, Place 2 sprays into both nostrils daily., Disp: 16 g, Rfl: 6   furosemide (LASIX) 40 MG tablet, TAKE 1 TABLET EVERY DAY, Disp: 90 tablet, Rfl: 1   glucose blood (ACCU-CHEK SMARTVIEW) test strip, Use as instructed, Disp: 300 strip, Rfl: 2   losartan (COZAAR) 100 MG tablet, TAKE 1 TABLET EVERY DAY (PLEASE MAKE OVERDUE APPT WITH DR. Lovena Le BEFORE ANY MORE REFILLS, 2ND ATTEMPT, CALL [phone]), Disp: 90 tablet, Rfl: 1   melatonin 5 MG TABS, Take 5 mg by mouth at bedtime., Disp: , Rfl:    metFORMIN (GLUCOPHAGE) 500 MG tablet, TAKE 1/2 TABLET EVERY DAY WITH BREAKFAST - SCHEDULE APPT FOR FUTURE REFILLS, Disp: 45 tablet, Rfl: 0   metoprolol succinate (TOPROL-XL) 100 MG 24 hr tablet, TAKE 1 TABLET EVERY DAY, Disp: 90 tablet, Rfl: 3   Misc Natural Products (TART CHERRY ADVANCED) CAPS, Take 2 capsules by mouth daily. , Disp: , Rfl:    Multiple Vitamin (MULTIVITAMIN) tablet, Take 1 tablet by mouth daily.  , Disp: , Rfl:    nitroGLYCERIN (NITROSTAT) 0.4 MG SL tablet, Place 1 tablet (0.4 mg total) under the tongue every 5 (five) minutes as needed. For chest pain, Disp: 75 tablet, Rfl: 2   potassium chloride (KLOR-CON) 10 MEQ tablet, TAKE 1 TABLET EVERY DAY, Disp: 90 tablet, Rfl: 2   Probiotic Product (PHILLIPS COLON HEALTH PO), Take 1 capsule by mouth daily., Disp: , Rfl:    vitamin B-12 (CYANOCOBALAMIN) 1000 MCG tablet, Take 1,000 mcg by  mouth daily., Disp: , Rfl:    warfarin (COUMADIN) 10 MG tablet, TAKE 1/2 TO 1 TABLET DAILY OR AS DIRECTED BY COUMADIN CLINIC, Disp: 90 tablet, Rfl: 1  Cognitive Assessment Identity Confirmed: : DOB; Name Cognitive Status: Normal   Functional Assessment Hearing Difficulty or Deaf: yes Hearing Management: Requires hearing aides Wear Glasses or Blind: yes Vision Management: Wears prescription glasses Concentrating, Remembering or Making Decisions Difficulty (CP): no Difficulty Communicating: no Difficulty Eating/Swallowing: no Walking or Climbing Stairs Difficulty: yes Walking or Climbing Stairs: ambulation difficulty, requires equipment Mobility Management: Impaired balance d/t Parkinson's. Has cane, walker and wheelchair to use as needed. Dressing/Bathing Difficulty: yes Dressing/Bathing: dressing difficulty, assistance 1 person Dressing/Bathing Management: Occassionally requires minimal assistance with dressing. Spouse available to assist when needed Doing Errands Independently Difficulty (such as shopping) (CP): no Change in Functional Status Since Onset of Current Illness/Injury: no  Caregiver Assessment  Primary Source of Support/Comfort: child(ren); spouse People in Home: spouse Name(s) of People in Home: [place]  [place] Family Caregiver if Needed: spouse Family Caregiver Names: Mel Almond Kirtley  Planned Interventions  HTN Reviewed medications and indications for use. Reports taking all medications as prescribed. Reports spouse is currently assisting with medication management. Denies current concerns r/t prescription cost. Provided information regarding established blood pressure parameters along with indications for notifying a provider. Currently monitoring BP every other day and maintaining a log. Reports recent systolic readings have ranged from 106 to 113. Reports diastolic readings have ranged in the 60s and 70s.  Discussed recent hospitalization from 11/14-11/18/23.  Patient was admitted for chest pain and treated for NSTEMI. Reports doing well since discharge. Currently does not require home health services. Reports cardiac rehab was offered but he declined d/t knee and joint pain. Reports engaging in regular low impact walking since discharge. Denies episodes of chest discomfort, palpitations, or shortness of breath since discharge. Discussed compliance with recommended cardiac prudent diet. Spouse reports he is doing very well with nutritional intake. Reports consuming mostly bland diet since hospital discharge. Reports adhering to low sodium, low carbohydrate recommendations.  Encouraged to continue reading nutrition labels, monitoring sodium intake and avoid highly processed foods when possible. Reviewed pending appointments. Will complete PCP follow up on 06/17/22. Will complete outreach with the Coumadin clinic on 06/21/22. Will complete follow up with the Cardiology team on 07/20/21. Reviewed s/sx of heart attack, stroke and worsening symptoms that require immediate medical attention.  DM Reviewed plan for diabetes management. Reviewed medications. During previous PCP visit, metformin was decreased from 580m to 250 mg. Reports taking medications as prescribed.  Provided information regarding importance of consistent blood glucose monitoring. Reports monitoring as advise and maintaining a log. Reports fasting readings have been within range from the 90's to 110. Reading today was 99 mg/dl.  Reviewed s/sx of hypoglycemia and hyperglycemia along with recommended interventions.  Discussed nutritional intake and importance of complying with a diabetic diet. Reports adhering to recommended diet. Spouse reports monitoring intake of carbs and added sugars.  Discussed importance of completing recommended DM preventive care. Reports adhering to recommendations regarding daily foot care. Reports exams are completed by provider. Eye exam is up to date.  Discussed  importance of completing ordered labs as prescribed.  Assessed social determinant of health barriers.   Interaction and coordination with outside resources, practitioners, and providers See CCM Referral  Care Plan: Available in MyChart

## 2022-06-17 ENCOUNTER — Encounter: Payer: Self-pay | Admitting: Adult Health

## 2022-06-17 ENCOUNTER — Other Ambulatory Visit: Payer: Self-pay | Admitting: Adult Health

## 2022-06-17 ENCOUNTER — Ambulatory Visit (INDEPENDENT_AMBULATORY_CARE_PROVIDER_SITE_OTHER): Payer: Medicare HMO

## 2022-06-17 ENCOUNTER — Ambulatory Visit (INDEPENDENT_AMBULATORY_CARE_PROVIDER_SITE_OTHER): Payer: Medicare HMO | Admitting: Adult Health

## 2022-06-17 VITALS — Ht 66.0 in | Wt 200.0 lb

## 2022-06-17 VITALS — BP 120/80 | HR 75 | Temp 98.1°F | Ht 66.0 in | Wt 200.0 lb

## 2022-06-17 DIAGNOSIS — E785 Hyperlipidemia, unspecified: Secondary | ICD-10-CM | POA: Diagnosis not present

## 2022-06-17 DIAGNOSIS — Z7901 Long term (current) use of anticoagulants: Secondary | ICD-10-CM

## 2022-06-17 DIAGNOSIS — I214 Non-ST elevation (NSTEMI) myocardial infarction: Secondary | ICD-10-CM

## 2022-06-17 DIAGNOSIS — I2510 Atherosclerotic heart disease of native coronary artery without angina pectoris: Secondary | ICD-10-CM | POA: Diagnosis not present

## 2022-06-17 DIAGNOSIS — I10 Essential (primary) hypertension: Secondary | ICD-10-CM

## 2022-06-17 DIAGNOSIS — M170 Bilateral primary osteoarthritis of knee: Secondary | ICD-10-CM | POA: Diagnosis not present

## 2022-06-17 DIAGNOSIS — Z952 Presence of prosthetic heart valve: Secondary | ICD-10-CM

## 2022-06-17 DIAGNOSIS — E119 Type 2 diabetes mellitus without complications: Secondary | ICD-10-CM

## 2022-06-17 DIAGNOSIS — G20B2 Parkinson's disease with dyskinesia, with fluctuations: Secondary | ICD-10-CM

## 2022-06-17 DIAGNOSIS — I442 Atrioventricular block, complete: Secondary | ICD-10-CM

## 2022-06-17 DIAGNOSIS — Z0000 Encounter for general adult medical examination without abnormal findings: Secondary | ICD-10-CM

## 2022-06-17 LAB — CBC WITH DIFFERENTIAL/PLATELET
Basophils Absolute: 0 10*3/uL (ref 0.0–0.1)
Basophils Relative: 0.5 % (ref 0.0–3.0)
Eosinophils Absolute: 0.1 10*3/uL (ref 0.0–0.7)
Eosinophils Relative: 0.9 % (ref 0.0–5.0)
HCT: 40.2 % (ref 39.0–52.0)
Hemoglobin: 13.5 g/dL (ref 13.0–17.0)
Lymphocytes Relative: 19.3 % (ref 12.0–46.0)
Lymphs Abs: 1.8 10*3/uL (ref 0.7–4.0)
MCHC: 33.6 g/dL (ref 30.0–36.0)
MCV: 88.3 fl (ref 78.0–100.0)
Monocytes Absolute: 0.7 10*3/uL (ref 0.1–1.0)
Monocytes Relative: 8 % (ref 3.0–12.0)
Neutro Abs: 6.7 10*3/uL (ref 1.4–7.7)
Neutrophils Relative %: 71.3 % (ref 43.0–77.0)
Platelets: 238 10*3/uL (ref 150.0–400.0)
RBC: 4.55 Mil/uL (ref 4.22–5.81)
RDW: 15.2 % (ref 11.5–15.5)
WBC: 9.3 10*3/uL (ref 4.0–10.5)

## 2022-06-17 LAB — MICROALBUMIN / CREATININE URINE RATIO
Creatinine,U: 210.5 mg/dL
Microalb Creat Ratio: 0.4 mg/g (ref 0.0–30.0)
Microalb, Ur: 0.8 mg/dL (ref 0.0–1.9)

## 2022-06-17 LAB — COMPREHENSIVE METABOLIC PANEL
ALT: 12 U/L (ref 0–53)
AST: 23 U/L (ref 0–37)
Albumin: 4.1 g/dL (ref 3.5–5.2)
Alkaline Phosphatase: 52 U/L (ref 39–117)
BUN: 20 mg/dL (ref 6–23)
CO2: 31 mEq/L (ref 19–32)
Calcium: 9.3 mg/dL (ref 8.4–10.5)
Chloride: 103 mEq/L (ref 96–112)
Creatinine, Ser: 0.92 mg/dL (ref 0.40–1.50)
GFR: 79.97 mL/min (ref >60.00)
Glucose, Bld: 101 mg/dL — ABNORMAL HIGH (ref 70–99)
Potassium: 3.7 mEq/L (ref 3.5–5.1)
Sodium: 140 mEq/L (ref 135–145)
Total Bilirubin: 0.8 mg/dL (ref 0.2–1.2)
Total Protein: 6.6 g/dL (ref 6.0–8.3)

## 2022-06-17 LAB — TSH: TSH: 1.04 u[IU]/mL (ref 0.35–5.50)

## 2022-06-17 MED ORDER — METFORMIN HCL 500 MG PO TABS: ORAL_TABLET | ORAL | 1 refills | Status: DC

## 2022-06-17 NOTE — Progress Notes (Signed)
Subjective:   [name] is a 76 y.o. male who presents for Medicare Annual/Subsequent preventive examination.  Review of Systems    Virtual Visit via Telephone Note  I connected with  [name] on 06/17/22 at  1:00 PM EST by telephone and verified that I am speaking with the correct person using two identifiers.  Location: Patient: Home Provider: Office Persons participating in the virtual visit: patient/Nurse Health Advisor   I discussed the limitations, risks, security and privacy concerns of performing an evaluation and management service by telephone and the availability of in person appointments. The patient expressed understanding and agreed to proceed.  Interactive audio and video telecommunications were attempted between this nurse and patient, however failed, due to patient having technical difficulties OR patient did not have access to video capability.  We continued and completed visit with audio only.  Some vital signs may be absent or patient reported.   Criselda Peaches, LPN  Cardiac Risk Factors include: advanced age (>87mn, >>29women);diabetes mellitus;male gender;hypertension     Objective:    Today's Vitals   06/17/22 1311 06/17/22 1312  Weight: 200 lb (90.7 kg)   Height: _0  (1.676 m)   PainSc:  0-No pain   Body mass index is 32.28 kg/m.     06/17/2022    1:22 PM 05/25/2022   11:55 AM 06/16/2021    1:15 PM 06/11/2020   11:32 AM 03/15/2018    1:55 PM 03/09/2018   11:34 AM 05/06/2016    6:24 AM  Advanced Directives  Does Patient Have a Medical Advance Directive? _1  No No  Would patient like information on creating a medical advance directive? No - Patient declined No - Patient declined No - Patient declined Yes (MAU/Ambulatory/Procedural Areas - Information given) No - Patient declined No - Patient declined Yes - Educational materials given    Current Medications (verified) Outpatient Encounter Medications as of 06/17/2022   Medication Sig   Accu-Chek FastClix Lancets MISC USE TO CHECK BLOOD SUGAR TWICE A DAY AND AS NEEDED   acetaminophen (TYLENOL) 650 MG CR tablet Take 1,300 mg by mouth 2 (two) times daily.   albuterol (VENTOLIN HFA) 108 (90 Base) MCG/ACT inhaler INHALE 2 PUFFS INTO THE LUNGS EVERY 6 HOURS AS NEEDED FOR WHEEZING OR SHORTNESS OF BREATH.   Ascorbic Acid (VITAMIN C) 1000 MG tablet Take 1,000 mg by mouth daily.   aspirin 81 MG tablet Take 81 mg by mouth every evening.    atorvastatin (LIPITOR) 80 MG tablet Take 1 tablet (80 mg total) by mouth daily.   Blood Glucose Monitoring Suppl (ACCU-CHEK GUIDE) w/Device KIT Use as directed   calcium carbonate (OS-CAL) 600 MG TABS Take 600 mg by mouth 2 (two) times daily with a meal.   carbidopa-levodopa (SINEMET CR) 50-200 MG tablet Take 1 tablet by mouth at bedtime.   carbidopa-levodopa (SINEMET IR) 25-100 MG tablet Take 1 tablet by mouth 4 (four) times daily.   cetirizine (ZYRTEC) 10 MG tablet Take 10 mg by mouth daily.    cholecalciferol (VITAMIN D) 25 MCG (1000 UNIT) tablet Take 1,000 Units by mouth daily.   clopidogrel (PLAVIX) 75 MG tablet Take 1 tablet (75 mg total) by mouth daily.   fluticasone (FLONASE) 50 MCG/ACT nasal spray Place 2 sprays into both nostrils daily.   furosemide (LASIX) 40 MG tablet TAKE 1 TABLET EVERY DAY   glucose blood (ACCU-CHEK SMARTVIEW) test strip Use as instructed   losartan (COZAAR) 100  MG tablet TAKE 1 TABLET EVERY DAY (PLEASE MAKE OVERDUE APPT WITH DR. Lovena Le BEFORE ANY MORE REFILLS, 2ND ATTEMPT, CALL [phone])   melatonin 5 MG TABS Take 5 mg by mouth at bedtime.   metFORMIN (GLUCOPHAGE) 500 MG tablet TAKE 1/2 TABLET EVERY DAY WITH BREAKFAST - SCHEDULE APPT FOR FUTURE REFILLS   metoprolol succinate (TOPROL-XL) 100 MG 24 hr tablet TAKE 1 TABLET EVERY DAY   Misc Natural Products (TART CHERRY ADVANCED) CAPS Take 2 capsules by mouth daily.    Multiple Vitamin (MULTIVITAMIN) tablet Take 1 tablet by mouth daily.      nitroGLYCERIN (NITROSTAT) 0.4 MG SL tablet Place 1 tablet (0.4 mg total) under the tongue every 5 (five) minutes as needed. For chest pain   potassium chloride (KLOR-CON) 10 MEQ tablet TAKE 1 TABLET EVERY DAY   Probiotic Product (PHILLIPS COLON HEALTH PO) Take 1 capsule by mouth daily.   vitamin B-12 (CYANOCOBALAMIN) 1000 MCG tablet Take 1,000 mcg by mouth daily.   warfarin (COUMADIN) 10 MG tablet TAKE 1/2 TO 1 TABLET DAILY OR AS DIRECTED BY COUMADIN CLINIC   [DISCONTINUED] metFORMIN (GLUCOPHAGE) 500 MG tablet TAKE 1/2 TABLET EVERY DAY WITH BREAKFAST - SCHEDULE APPT FOR FUTURE REFILLS   No facility-administered encounter medications on file as of 06/17/2022.    Allergies (verified) Bee venom, Codeine phosphate, Metoprolol tartrate, Pramipexole, Tramadol, and Ramipril   History: Past Medical History:  Diagnosis Date   Acute renal failure ([place]) 02/09/2013   Aortic stenosis 08/25/2010   a. [place] aortic root replacement with a St. Jude mechanical valve and Hemashield conduit 02/2004.   Arthritis    Asthma    AV BLOCK, COMPLETE    a. s/p St Jude dual chamber pacemaker 02/2004.   CAD (coronary artery disease)    a. s/p CABGx2 (LIMA-dLAD, SVG-Cx). b. Low risk nuc 12/2012 without ischemia, EF 46% mild apical hypokinesia (EF 55% inf HK by echo).   Carotid artery disease (HCC)    a. 0-39% bilateral ICA stenosis, stable mild hard plaque in carotid bulbs. F/u 03/2014 recommended.   Diabetes mellitus without complication ([place])    type 2    DIVERTICULOSIS, COLON 10/17/2007   Ejection fraction    a. EF 55% with inf HK, mild MR by echo 12/2012.   GERD (gastroesophageal reflux disease)    hxof    GOUT 01/16/2007   Headache    hx of migraines    HEMORRHOIDS, INTERNAL 11/01/2008   Hx of adenomatous polyp of colon 02/2019   no recall needed   HYPERLIPIDEMIA 01/16/2007   HYPERTENSION 01/16/2007   Impaired glucose tolerance    LEG EDEMA 11/27/2008   Special screening for malignant neoplasm of prostate     Warfarin anticoagulation    AVR   Past Surgical History:  Procedure Laterality Date   AORTIC VALVE REPLACEMENT     CARDIAC CATHETERIZATION  06/2003   CIRCUMCISION     COLONOSCOPY     CORONARY ARTERY BYPASS GRAFT     CORONARY ATHERECTOMY N/A 05/28/2022   Procedure: CORONARY ATHERECTOMY;  Surgeon: Martinique, Peter M, MD;  Location: [facility];  Service: Cardiovascular;  Laterality: N/A;   CORONARY LITHOTRIPSY N/A 05/28/2022   Procedure: CORONARY LITHOTRIPSY;  Surgeon: Martinique, Peter M, MD;  Location: [facility];  Service: Cardiovascular;  Laterality: N/A;   CORONARY STENT INTERVENTION N/A 05/28/2022   Procedure: CORONARY STENT INTERVENTION;  Surgeon: Martinique, Peter M, MD;  Location: [facility];  Service: Cardiovascular;  Laterality: N/A;  coronary stents      prior to bypass    EP IMPLANTABLE DEVICE N/A 05/06/2016   Procedure: PPM Generator Changeout;  Surgeon: Evans Lance, MD;  Location: [facility];  Service: Cardiovascular;  Laterality: N/A;   ESOPHAGOGASTRODUODENOSCOPY     HERNIA REPAIR     INGUINAL HERNIA REPAIR Bilateral 02/07/2013   Procedure: LAPAROSCOPIC BILATERAL INGUINAL HERNIA REPAIR;  Surgeon: Imogene Burn. Georgette Dover, MD;  Location: [facility];  Service: General;  Laterality: Bilateral;   INSERTION OF MESH Bilateral 02/07/2013   Procedure: INSERTION OF MESH;  Surgeon: Imogene Burn. Georgette Dover, MD;  Location: [facility];  Service: General;  Laterality: Bilateral;   LEFT HEART CATH AND CORS/GRAFTS ANGIOGRAPHY N/A 05/27/2022   Procedure: LEFT HEART CATH AND CORS/GRAFTS ANGIOGRAPHY;  Surgeon: Jettie Booze, MD;  Location: [facility];  Service: Cardiovascular;  Laterality: N/A;   LUMBAR LAMINECTOMY/DECOMPRESSION MICRODISCECTOMY N/A 03/15/2018   Procedure: Complete decompressive lumbar laminectomy for spinal stenosis of L4-L5 and foraminotomy for L4-L5 root on right;  Surgeon: [name], MD;  Location: [facility];  Service: Orthopedics;  Laterality: N/A;    PACEMAKER INSERTION     SHOULDER SURGERY Bilateral    SKIN BIOPSY  05/04/2019   BASAL CELL CARCINOMA//MID CENTRAL NASAL   SKIN BIOPSY  10/11/2019   Superficial basal cell carcinoma - left superior central forehead shave   Family History  Problem Relation Age of Onset   Heart attack Father    Kidney cancer Father    Hypertension Mother    Diabetes Mother    Diabetes Brother    Epilepsy Sister    Diabetes Sister    Diabetes Brother    Colon cancer Neg Hx    Rectal cancer Neg Hx    Stomach cancer Neg Hx    Esophageal cancer Neg Hx    Social History   Socioeconomic History   Marital status: Married    Spouse name: Not on file   Number of children: 3   Years of education: Not on file   Highest education level: Not on file  Occupational History   Occupation: retired  Tobacco Use   Smoking status: Former    Types: Cigarettes    Quit date: 07/12/1973    Years since quitting: 48.9   Smokeless tobacco: Never  Vaping Use   Vaping Use: Never used  Substance and Sexual Activity   Alcohol use: No   Drug use: No   Sexual activity: Not on file  Other Topics Concern   Not on file  Social History Narrative   The patient is married and retired   He reports 3 grown children   Former smoker, no alcohol or tobacco or drug use at this time   Social Determinants of Radio broadcast assistant Strain: Low Risk  (06/17/2022)   Overall Financial Resource Strain (CARDIA)    Difficulty of Paying Living Expenses: Not hard at all  Food Insecurity: No Food Insecurity (06/17/2022)   Hunger Vital Sign    Worried About Running Out of Food in the Last Year: Never true    Ran Out of Food in the Last Year: Never true  Transportation Needs: No Transportation Needs (06/17/2022)   PRAPARE - Hydrologist (Medical): No    Lack of Transportation (Non-Medical): No  Physical Activity: Inactive (06/17/2022)   Exercise Vital Sign    Days of Exercise per Week: 0 days     Minutes of Exercise per Session:  0 min  Stress: No Stress Concern Present (06/17/2022)   [place]    Feeling of Stress : Not at all  Social Connections: [place] (06/17/2022)   Social Connection and Isolation Panel [NHANES]    Frequency of Communication with Friends and Family: More than three times a week    Frequency of Social Gatherings with Friends and Family: More than three times a week    Attends Religious Services: More than 4 times per year    Active Member of [employer] or Organizations: Yes    Attends Music therapist: More than 4 times per year    Marital Status: Married    Tobacco Counseling Counseling given: Not Answered   Clinical Intake:  Pre-visit preparation completed: No  Pain : No/denies pain Pain Score: 0-No painNutrition Risk Assessment:  Has the patient had any N/V/D within the last 2 months?  No  Does the patient have any non-healing wounds?  No  Has the patient had any unintentional weight loss or weight gain?  No   Diabetes:  Is the patient diabetic?  Yes  If diabetic, was a CBG obtained today?  No  Did the patient bring in their glucometer from home?  No  How often do you monitor your CBG's? Every other day.   Financial Strains and Diabetes Management:  Are you having any financial strains with the device, your supplies or your medication? No .  Does the patient want to be seen by Chronic Care Management for management of their diabetes?  No  Would the patient like to be referred to a Nutritionist or for Diabetic Management?  No   Diabetic Exams:  Diabetic Eye Exam: Completed No. Overdue for diabetic eye exam. Pt has been advised about the importance in completing this exam. A referral has been placed today. Message sent to referral coordinator for scheduling purposes. Advised pt to expect a call from office referred to regarding appt.  Diabetic Foot Exam:  Completed No. Pt has been advised about the importance in completing this exam. Pt is scheduled for diabetic foot exam on Followed by PCP.       BMI - recorded: 32.28 Nutritional Status: BMI > 30  Obese Nutritional Risks: None Diabetes: Yes CBG done?: No Did pt. bring in CBG monitor from home?: No  How often do you need to have someone help you when you read instructions, pamphlets, or other written materials from your doctor or pharmacy?: 1 - Never  Diabetic?  Yes  Interpreter Needed?: No  Information entered by :: Rolene Arbour LPN   Activities of Daily Living    06/17/2022    1:21 PM 06/17/2022   10:06 AM  In your present state of health, do you have any difficulty performing the following activities:  Hearing? 1 1  Comment Wears hearing aids hearing aids  Vision? 0 1  Comment  due for eye exam  Difficulty concentrating or making decisions? 0 0  Walking or climbing stairs? 0 1  Dressing or bathing? 0 1  Doing errands, shopping? 0 1  Preparing Food and eating ? N   Using the Toilet? N   In the past six months, have you accidently leaked urine? N   Do you have problems with loss of bowel control? N   Managing your Medications? N   Managing your Finances? N   Housekeeping or managing your Housekeeping? N     Patient Care Team: [name],  NP as PCP - General (Family Medicine) Jerline Pain, MD as PCP - Cardiology (Cardiology) Evans Lance, MD as PCP - Electrophysiology (Cardiology) [name], MD as Consulting Physician (Orthopedic Surgery) [name], MD as Consulting Physician (Dermatology) Viona Gilmore, [facility] as Pharmacist (Pharmacist)  Indicate any recent Medical Services you may have received from other than [facility] providers in the past year (date may be approximate).     Assessment:   This is a routine wellness examination for [place].  Hearing/Vision screen Hearing Screening - Comments:: Wears hearing aids Vision Screening -  Comments:: Wears rx glasses - up to date with routine eye exams with  [facility]  Dietary issues and exercise activities discussed: Current Exercise Habits: The patient does not participate in regular exercise at present, Exercise limited by: orthopedic condition(s)   Goals Addressed               This Visit's Progress     No current goals (pt-stated)         Depression Screen    06/17/2022    1:19 PM 06/11/2022    2:20 PM 06/16/2021    1:02 PM 06/11/2020   11:38 AM 01/09/2020    9:43 AM 10/11/2016   12:46 PM 06/07/2016   11:00 AM  PHQ 2/9 Scores  PHQ - 2 Score 0 0 0 0 0 0 0  PHQ- 9 Score    0       Fall Risk    06/17/2022    1:21 PM 06/11/2022    2:20 PM 06/16/2021    1:06 PM 06/14/2021    5:09 PM 05/07/2021    9:47 AM  [place] in the past year? _0 Number falls in past yr: 0 _1 Comment   Patient fell injuries to forehead, Rt eye and left shoulder. Followed by PCP    Injury with Fall? 0 0 _2 Comment No Injury or medical attention needed  Shoulder, rt eye and forehead. Followed by PCP    Risk for fall due to : No Fall Risks History of fall(s);Impaired balance/gait;Medication side effect;Other (Comment)     Risk for fall due to: Comment  Dx of Parkinson's Disease     Follow up Falls prevention discussed Falls prevention discussed       FALL RISK PREVENTION PERTAINING TO THE HOME:  Any stairs in or around the home? Yes  If so, are there any without handrails? No  Home free of loose throw rugs in walkways, pet beds, electrical cords, etc? Yes  Adequate lighting in your home to reduce risk of falls? Yes   ASSISTIVE DEVICES UTILIZED TO PREVENT FALLS:  Life alert? No  Use of a cane, walker or w/c? Yes  Grab bars in the bathroom? Yes  Shower chair or bench in shower? Yes  Elevated toilet seat or a handicapped toilet? No   TIMED UP AND GO:  Was the test performed? No . Audio Visit  Cognitive Function:        06/17/2022     1:22 PM 06/16/2021    1:16 PM  6CIT Screen  What Year? 0 points 0 points  What month? 0 points 0 points  What time? 0 points 0 points  Count back from 20 0 points 0 points  Months in reverse 0 points 0 points  Repeat phrase 0 points  2 points  Total Score 0 points 2 points    Immunizations Immunization History  Administered Date(s) Administered   Fluad Quad(high Dose 65+) 03/22/2019, 04/01/2020, 04/09/2021, 03/19/2022   Influenza Split 03/31/2011, 04/10/2012   Influenza Whole 04/04/2008, 05/09/2009, 03/18/2010   Influenza, High Dose Seasonal PF 04/28/2015, 04/06/2016, 04/12/2017, 03/30/2018   Influenza,inj,Quad PF,6+ Mos 04/02/2013   Influenza-Unspecified 04/05/2014   Pneumococcal Conjugate-13 02/13/2014   Pneumococcal Polysaccharide-23 10/07/2011   Tdap 10/07/2011, 07/16/2012   Zoster Recombinat (Shingrix) 10/11/2017, 12/12/2017   Zoster, Live 10/07/2011    TDAP status: Up to date  Flu Vaccine status: Up to date  Pneumococcal vaccine status: Up to date  Covid-19 vaccine status: Declined, Education has been provided regarding the importance of this vaccine but patient still declined. Advised may receive this vaccine at local pharmacy or Health Dept.or vaccine clinic. Aware to provide a copy of the vaccination record if obtained from local pharmacy or Health Dept. Verbalized acceptance and understanding.  Qualifies for Shingles Vaccine? Yes   Zostavax completed Yes   Shingrix Completed?: Yes  Screening Tests Health Maintenance  Topic Date Due   Diabetic kidney evaluation - Urine ACR  10/12/2018   COVID-19 Vaccine (1) 07/03/2022 (Originally 12/23/1946)   DTaP/Tdap/Td (3 - Td or Tdap) 07/16/2022   OPHTHALMOLOGY EXAM  08/05/2022   HEMOGLOBIN A1C  11/23/2022   Diabetic kidney evaluation - GFR measurement  05/30/2023   FOOT EXAM  06/18/2023   Medicare Annual Wellness (AWV)  06/18/2023   Pneumonia Vaccine 87+ Years old  Completed   INFLUENZA VACCINE  Completed    Hepatitis C Screening  Completed   Zoster Vaccines- Shingrix  Completed   HPV VACCINES  Aged Out   COLONOSCOPY (Pts 45-21yr Insurance coverage will need to be confirmed)  Discontinued    Health Maintenance  Health Maintenance Due  Topic Date Due   Diabetic kidney evaluation - Urine ACR  10/12/2018    Colorectal cancer screening: No longer required.   Lung Cancer Screening: (Low Dose CT Chest recommended if Age 369-80years, 30 pack-year currently smoking OR have quit w/in 15years.) does not qualify.     Additional Screening:  Hepatitis C Screening: does qualify; Completed 10/11/16  Vision Screening: Recommended annual ophthalmology exams for early detection of glaucoma and other disorders of the eye. Is the patient up to date with their annual eye exam?  Yes  Who is the provider or what is the name of the office in which the patient attends annual eye exams? [facility] If pt is not established with a provider, would they like to be referred to a provider to establish care? No .   Dental Screening: Recommended annual dental exams for proper oral hygiene  Community Resource Referral / Chronic Care Management:  CRR required this visit?  No   CCM required this visit?  No      Plan:     I have personally reviewed and noted the following in the patient's chart:   Medical and social history Use of alcohol, tobacco or illicit drugs  Current medications and supplements including opioid prescriptions. Patient is not currently taking opioid prescriptions. Functional ability and status Nutritional status Physical activity Advanced directives List of other physicians Hospitalizations, surgeries, and ER visits in previous 12 months Vitals Screenings to include cognitive, depression, and falls Referrals and appointments  In addition, I have reviewed and discussed with patient certain preventive protocols, quality metrics, and best practice recommendations. A written  personalized care plan for preventive services  as well as general preventive health recommendations were provided to patient.     Criselda Peaches, LPN   06/12/4824   Nurse Notes: None

## 2022-06-17 NOTE — Patient Instructions (Addendum)
[name] , Thank you for taking time to come for your Medicare Wellness Visit. I appreciate your ongoing commitment to your health goals. Please review the following plan we discussed and let me know if I can assist you in the future.   These are the goals we discussed:  Goals       Exercise 3x per week (30 min per time)      Goal: CCM (Diabetes) Expected Outcome: Monitor, Self-Manage and Reduce Symptoms of Diabetes      Current Barriers:  Chronic Disease Management support and education needs related to Diabetes  Planned Interventions: Reviewed plan for diabetes management. Reviewed medications. During previous PCP visit, metformin was decreased from '500mg'$  to 250 mg. Reports taking medications as prescribed.  Provided information regarding importance of consistent blood glucose monitoring. Reports monitoring as advise and maintaining a log. Reports fasting readings have been within range from the 90's to 110. Reading today was 99 mg/dl.  Reviewed s/sx of hypoglycemia and hyperglycemia along with recommended interventions.  Discussed nutritional intake and importance of complying with a diabetic diet. Reports adhering to recommended diet. Spouse reports monitoring intake of carbs and added sugars.  Discussed importance of completing recommended DM preventive care. Reports adhering to recommendations regarding daily foot care. Reports exams are completed by provider. Eye exam is up to date.  Discussed importance of completing ordered labs as prescribed.  Assessed social determinant of health barriers.   Lab Results  Component Value Date   HGBA1C 6.4 (H) 05/25/2022    Symptom Management: Take medications as prescribed   Attend all scheduled provider appointments Call pharmacy for medication refills 3-7 days in advance of running out of medications Call provider office for new concerns or questions  Continue monitoring blood sugars daily and maintain a log. Wash and dry feet carefully  every day Check feet daily for cuts, sores or redness Trim toenails straight across Continue wearing comfortable, well-fitting shoes Continue adhering to recommended ADA diet.  Follow Up Plan:   Will follow up next month        Goal: CCM (Hypertension) Expected Outcome: Monitor, Self-Manage and Reduce Symptoms of Hypertension      Current Barriers:  Chronic Disease Management support and education needs related to Hypertension Management  Planned Interventions: Reviewed medications and indications for use. Reports taking all medications as prescribed. Reports spouse is currently assisting with medication management. Denies current concerns r/t prescription cost. Provided information regarding established blood pressure parameters along with indications for notifying a provider. Currently monitoring BP every other day and maintaining a log. Reports recent systolic readings have ranged from 106 to 113. Reports diastolic readings have ranged in the 60s and 70s.  Discussed recent hospitalization from 11/14-11/18/23. Patient was admitted for chest pain and treated for NSTEMI. Reports doing well since discharge. Currently does not require home health services. Reports cardiac rehab was offered but he declined d/t knee and joint pain. Reports engaging in regular low impact walking since discharge. Denies episodes of chest discomfort, palpitations, or shortness of breath since discharge. Discussed compliance with recommended cardiac prudent diet. Spouse reports he is doing very well with nutritional intake. Reports consuming mostly bland diet since hospital discharge. Reports adhering to low sodium, low carbohydrate recommendations.  Encouraged to continue reading nutrition labels, monitoring sodium intake and avoid highly processed foods when possible. Reviewed pending appointments. Will complete PCP follow up on 06/17/22. Will complete outreach with the Coumadin clinic on 06/21/22. Will complete follow  up with the Cardiology  team on 07/20/21. Reviewed s/sx of heart attack, stroke and worsening symptoms that require immediate medical attention.   BP Readings from Last 3 Encounters:  06/02/22 (!) 110/56  05/29/22 (!) 144/67  05/10/22 132/72    Symptom Management: Take medications as prescribed   Attend all scheduled provider appointments Call pharmacy for medication refills 3-7 days in advance of running out of medications Continue monitoring BP and maintain a log Call doctor for signs and symptoms of high blood pressure Report new symptoms to your doctor Continue engaging in low impact activity as tolerated and use assistive device. Attend Cardiology follow up as scheduled.   Follow Up Plan:   Will follow up next month        No current goals (pt-stated)        This is a list of the screening recommended for you and due dates:  Health Maintenance  Topic Date Due   Yearly kidney health urinalysis for diabetes  10/12/2018   COVID-19 Vaccine (1) 07/03/2022*   DTaP/Tdap/Td vaccine (3 - Td or Tdap) 07/16/2022   Eye exam for diabetics  08/05/2022   Hemoglobin A1C  11/23/2022   Yearly kidney function blood test for diabetes  05/30/2023   Complete foot exam   06/18/2023   Medicare Annual Wellness Visit  06/18/2023   Pneumonia Vaccine  Completed   Flu Shot  Completed   Hepatitis C Screening: USPSTF Recommendation to screen - Ages 18-79 yo.  Completed   Zoster (Shingles) Vaccine  Completed   HPV Vaccine  Aged Out   Colon Cancer Screening  Discontinued  *Topic was postponed. The date shown is not the original due date.    Advanced directives: Advance directive discussed with you today. Even though you declined this today, please call our office should you change your mind, and we can give you the proper paperwork for you to fill out.   Conditions/risks identified: None  Next appointment: Follow up in one year for your annual wellness visit.    Preventive Care 27 Years and  Older, Male  Preventive care refers to lifestyle choices and visits with your health care provider that can promote health and wellness. What does preventive care include? A yearly physical exam. This is also called an annual well check. Dental exams once or twice a year. Routine eye exams. Ask your health care provider how often you should have your eyes checked. Personal lifestyle choices, including: Daily care of your teeth and gums. Regular physical activity. Eating a healthy diet. Avoiding tobacco and drug use. Limiting alcohol use. Practicing safe sex. Taking low doses of aspirin every day. Taking vitamin and mineral supplements as recommended by your health care provider. What happens during an annual well check? The services and screenings done by your health care provider during your annual well check will depend on your age, overall health, lifestyle risk factors, and family history of disease. Counseling  Your health care provider may ask you questions about your: Alcohol use. Tobacco use. Drug use. Emotional well-being. Home and relationship well-being. Sexual activity. Eating habits. History of falls. Memory and ability to understand (cognition). Work and work Statistician. Screening  You may have the following tests or measurements: Height, weight, and BMI. Blood pressure. Lipid and cholesterol levels. These may be checked every 5 years, or more frequently if you are over 66 years old. Skin check. Lung cancer screening. You may have this screening every year starting at age 77 if you have a 30-pack-year history of smoking  and currently smoke or have quit within the past 15 years. Fecal occult blood test (FOBT) of the stool. You may have this test every year starting at age 54. Flexible sigmoidoscopy or colonoscopy. You may have a sigmoidoscopy every 5 years or a colonoscopy every 10 years starting at age 42. Prostate cancer screening. Recommendations will vary  depending on your family history and other risks. Hepatitis C blood test. Hepatitis B blood test. Sexually transmitted disease (STD) testing. Diabetes screening. This is done by checking your blood sugar (glucose) after you have not eaten for a while (fasting). You may have this done every 1-3 years. Abdominal aortic aneurysm (AAA) screening. You may need this if you are a current or former smoker. Osteoporosis. You may be screened starting at age 60 if you are at high risk. Talk with your health care provider about your test results, treatment options, and if necessary, the need for more tests. Vaccines  Your health care provider may recommend certain vaccines, such as: Influenza vaccine. This is recommended every year. Tetanus, diphtheria, and acellular pertussis (Tdap, Td) vaccine. You may need a Td booster every 10 years. Zoster vaccine. You may need this after age 14. Pneumococcal 13-valent conjugate (PCV13) vaccine. One dose is recommended after age 34. Pneumococcal polysaccharide (PPSV23) vaccine. One dose is recommended after age 11. Talk to your health care provider about which screenings and vaccines you need and how often you need them. This information is not intended to replace advice given to you by your health care provider. Make sure you discuss any questions you have with your health care provider. Document Released: 07/25/2015 Document Revised: 03/17/2016 Document Reviewed: 04/29/2015 Elsevier Interactive Patient Education  2017 [place] Prevention in the Home Falls can cause injuries. They can happen to people of all ages. There are many things you can do to make your home safe and to help prevent falls. What can I do on the outside of my home? Regularly fix the edges of walkways and driveways and fix any cracks. Remove anything that might make you trip as you walk through a door, such as a raised step or threshold. Trim any bushes or trees on the path to your  home. Use bright outdoor lighting. Clear any walking paths of anything that might make someone trip, such as rocks or tools. Regularly check to see if handrails are loose or broken. Make sure that both sides of any steps have handrails. Any raised decks and porches should have guardrails on the edges. Have any leaves, snow, or ice cleared regularly. Use sand or salt on walking paths during winter. Clean up any spills in your garage right away. This includes oil or grease spills. What can I do in the bathroom? Use night lights. Install grab bars by the toilet and in the tub and shower. Do not use towel bars as grab bars. Use non-skid mats or decals in the tub or shower. If you need to sit down in the shower, use a plastic, non-slip stool. Keep the floor dry. Clean up any water that spills on the floor as soon as it happens. Remove soap buildup in the tub or shower regularly. Attach bath mats securely with double-sided non-slip rug tape. Do not have throw rugs and other things on the floor that can make you trip. What can I do in the bedroom? Use night lights. Make sure that you have a light by your bed that is easy to reach. Do not use any  sheets or blankets that are too big for your bed. They should not hang down onto the floor. Have a firm chair that has side arms. You can use this for support while you get dressed. Do not have throw rugs and other things on the floor that can make you trip. What can I do in the kitchen? Clean up any spills right away. Avoid walking on wet floors. Keep items that you use a lot in easy-to-reach places. If you need to reach something above you, use a strong step stool that has a grab bar. Keep electrical cords out of the way. Do not use floor polish or wax that makes floors slippery. If you must use wax, use non-skid floor wax. Do not have throw rugs and other things on the floor that can make you trip. What can I do with my stairs? Do not leave any  items on the stairs. Make sure that there are handrails on both sides of the stairs and use them. Fix handrails that are broken or loose. Make sure that handrails are as long as the stairways. Check any carpeting to make sure that it is firmly attached to the stairs. Fix any carpet that is loose or worn. Avoid having throw rugs at the top or bottom of the stairs. If you do have throw rugs, attach them to the floor with carpet tape. Make sure that you have a light switch at the top of the stairs and the bottom of the stairs. If you do not have them, ask someone to add them for you. What else can I do to help prevent falls? Wear shoes that: Do not have high heels. Have rubber bottoms. Are comfortable and fit you well. Are closed at the toe. Do not wear sandals. If you use a stepladder: Make sure that it is fully opened. Do not climb a closed stepladder. Make sure that both sides of the stepladder are locked into place. Ask someone to hold it for you, if possible. Clearly mark and make sure that you can see: Any grab bars or handrails. First and last steps. Where the edge of each step is. Use tools that help you move around (mobility aids) if they are needed. These include: Canes. Walkers. Scooters. Crutches. Turn on the lights when you go into a dark area. Replace any light bulbs as soon as they burn out. Set up your furniture so you have a clear path. Avoid moving your furniture around. If any of your floors are uneven, fix them. If there are any pets around you, be aware of where they are. Review your medicines with your doctor. Some medicines can make you feel dizzy. This can increase your chance of falling. Ask your doctor what other things that you can do to help prevent falls. This information is not intended to replace advice given to you by your health care provider. Make sure you discuss any questions you have with your health care provider. Document Released: 04/24/2009  Document Revised: 12/04/2015 Document Reviewed: 08/02/2014 Elsevier Interactive Patient Education  2017 [employer].

## 2022-06-17 NOTE — Patient Instructions (Signed)
It was great seeing you today and I hope you have a happy birthday  We will follow up with you regarding your lab work   Please let me know if you need anything

## 2022-06-17 NOTE — Progress Notes (Signed)
Subjective:    Patient ID: [name], male    DOB: April 21, 1946, 76 y.o.   MRN: [id number]  HPI Patient presents for yearly preventative medicine examination. He is a pleasant 76 year old male who  has a past medical history of Acute renal failure ([place]) (02/09/2013), Aortic stenosis (08/25/2010), Arthritis, Asthma, AV BLOCK, COMPLETE, CAD (coronary artery disease), Carotid artery disease ([place]), Diabetes mellitus without complication ([place]), DIVERTICULOSIS, COLON (10/17/2007), Ejection fraction, GERD (gastroesophageal reflux disease), GOUT (01/16/2007), Headache, HEMORRHOIDS, INTERNAL (11/01/2008), adenomatous polyp of colon (02/2019), HYPERLIPIDEMIA (01/16/2007), HYPERTENSION (01/16/2007), Impaired glucose tolerance, LEG EDEMA (11/27/2008), Special screening for malignant neoplasm of prostate, and Warfarin anticoagulation.  H/o NSTEMI -on 05/25/2022.  Was brought to the Cath Lab on 05/27/2022 where he was found to have severe native two-vessel CAD including LAD and LCx.  As for VG to OM occluded.  Very complex distal LM, proximal CX, and proximal OM calcified lesion involving several bifurcations.  Plavix 75 mg was added to his regimen with aspirin 81 mg.  He was continued on Coumadin.  He will come off aspirin next week and continue with Plavix x 12 months.  He has not had any recent chest pain, shortness of breath, or palpitations.  He feels as though he is doing well and has no complaints.  Diabetes mellitus type 2-managed with metformin 250 mg daily.  Does monitor his blood sugars periodically with readings in the 120s or below.  He denies symptoms of hypoglycemia. Lab Results  Component Value Date   HGBA1C 6.4 (H) 05/25/2022   Hypertension - managed with losartan 100 mg daily, Lasix 40 mg daily, and Toprol 100 mg daily.  He denies dizziness, lightheadedness, chest pain, or shortness of breath BP Readings from Last 3 Encounters:  06/17/22 120/80  06/02/22 (!) 110/56  05/29/22 (!) 144/67    Hyperlipidemia/CAD-managed with Lipitor 80 mg daily.  He denies myalgia or fatigue.  He is on chronic anticoagulation with Plavix and Coumadin.  Lab Results  Component Value Date   CHOL 132 06/10/2022   HDL 46 06/10/2022   LDLCALC 63 06/10/2022   TRIG 128 06/10/2022   CHOLHDL 2.9 06/10/2022   Aortic stenosis with mechanical valve replacement/complete AV block-mechanical valve replacement in 2005.  He is on anticoagulation with Plavix  and Coumadin . He is followed by cardiology on a regular basis.  He does have a pacemaker  Osteoarthritis of bilateral knees-is managed by orthopedics.  He receives steroid injections every 3 to 4 months  Parkinson's disease and essential tremor-noted by neurology outside of the [facility] system.  He is currently prescribed Sinemet IR 25-100 mg.  All immunizations and health maintenance protocols were reviewed with the patient and needed orders were placed.  Appropriate screening laboratory values were ordered for the patient including screening of hyperlipidemia, renal function and hepatic function. If indicated by BPH, a PSA was ordered.  Medication reconciliation,  past medical history, social history, problem list and allergies were reviewed in detail with the patient  Goals were established with regard to weight loss, exercise, and  diet in compliance with medications  Review of Systems  Constitutional: Negative.   HENT: Negative.    Eyes: Negative.   Respiratory: Negative.    Cardiovascular: Negative.   Gastrointestinal: Negative.   Endocrine: Negative.   Genitourinary: Negative.   Musculoskeletal: Negative.   Skin: Negative.   Allergic/Immunologic: Negative.   Neurological:  Positive for tremors and numbness.  Hematological: Negative.   Psychiatric/Behavioral: Negative.    All  other systems reviewed and are negative.  Past Medical History:  Diagnosis Date   Acute renal failure ([place]) 02/09/2013   Aortic stenosis 08/25/2010   a. Bentall  aortic root replacement with a St. Jude mechanical valve and Hemashield conduit 02/2004.   Arthritis    Asthma    AV BLOCK, COMPLETE    a. s/p St Jude dual chamber pacemaker 02/2004.   CAD (coronary artery disease)    a. s/p CABGx2 (LIMA-dLAD, SVG-Cx). b. Low risk nuc 12/2012 without ischemia, EF 46% mild apical hypokinesia (EF 55% inf HK by echo).   Carotid artery disease (HCC)    a. 0-39% bilateral ICA stenosis, stable mild hard plaque in carotid bulbs. F/u 03/2014 recommended.   Diabetes mellitus without complication ([place])    type 2    DIVERTICULOSIS, COLON 10/17/2007   Ejection fraction    a. EF 55% with inf HK, mild MR by echo 12/2012.   GERD (gastroesophageal reflux disease)    hxof    GOUT 01/16/2007   Headache    hx of migraines    HEMORRHOIDS, INTERNAL 11/01/2008   Hx of adenomatous polyp of colon 02/2019   no recall needed   HYPERLIPIDEMIA 01/16/2007   HYPERTENSION 01/16/2007   Impaired glucose tolerance    LEG EDEMA 11/27/2008   Special screening for malignant neoplasm of prostate    Warfarin anticoagulation    AVR    Social History   Socioeconomic History   Marital status: Married    Spouse name: Not on file   Number of children: 3   Years of education: Not on file   Highest education level: Not on file  Occupational History   Occupation: retired  Tobacco Use   Smoking status: Former    Types: Cigarettes    Quit date: 07/12/1973    Years since quitting: 48.9   Smokeless tobacco: Never  Vaping Use   Vaping Use: Never used  Substance and Sexual Activity   Alcohol use: No   Drug use: No   Sexual activity: Not on file  Other Topics Concern   Not on file  Social History Narrative   The patient is married and retired   He reports 3 grown children   Former smoker, no alcohol or tobacco or drug use at this time   Social Determinants of Radio broadcast assistant Strain: Low Risk  (06/11/2022)   Overall Financial Resource Strain (CARDIA)    Difficulty of Paying  Living Expenses: Not very hard  Food Insecurity: No Food Insecurity (06/11/2022)   Hunger Vital Sign    Worried About Running Out of Food in the Last Year: Never true    Ran Out of Food in the Last Year: Never true  Transportation Needs: No Transportation Needs (06/11/2022)   PRAPARE - Hydrologist (Medical): No    Lack of Transportation (Non-Medical): No  Physical Activity: Sufficiently Active (06/11/2022)   Exercise Vital Sign    Days of Exercise per Week: 5 days    Minutes of Exercise per Session: 30 min  Stress: No Stress Concern Present (06/11/2022)   [place]    Feeling of Stress : Not at all  Social Connections: Moderately Integrated (06/11/2022)   Social Connection and Isolation Panel [NHANES]    Frequency of Communication with Friends and Family: More than three times a week    Frequency of Social Gatherings with Friends and Family: More than three  times a week    Attends Religious Services: More than 4 times per year    Active Member of Clubs or Organizations: No    Attends Archivist Meetings: Never    Marital Status: Married  Human resources officer Violence: Not At Risk (05/26/2022)   Humiliation, Afraid, Rape, and Kick questionnaire    Fear of Current or Ex-Partner: No    Emotionally Abused: No    Physically Abused: No    Sexually Abused: No    Past Surgical History:  Procedure Laterality Date   AORTIC VALVE REPLACEMENT     CARDIAC CATHETERIZATION  06/2003   CIRCUMCISION     COLONOSCOPY     CORONARY ARTERY BYPASS GRAFT     CORONARY ATHERECTOMY N/A 05/28/2022   Procedure: CORONARY ATHERECTOMY;  Surgeon: Martinique, Peter M, MD;  Location: [facility];  Service: Cardiovascular;  Laterality: N/A;   CORONARY LITHOTRIPSY N/A 05/28/2022   Procedure: CORONARY LITHOTRIPSY;  Surgeon: Martinique, Peter M, MD;  Location: [facility];  Service: Cardiovascular;  Laterality:  N/A;   CORONARY STENT INTERVENTION N/A 05/28/2022   Procedure: CORONARY STENT INTERVENTION;  Surgeon: Martinique, Peter M, MD;  Location: [facility];  Service: Cardiovascular;  Laterality: N/A;   coronary stents      prior to bypass    EP IMPLANTABLE DEVICE N/A 05/06/2016   Procedure: PPM Generator Changeout;  Surgeon: Evans Lance, MD;  Location: [facility];  Service: Cardiovascular;  Laterality: N/A;   ESOPHAGOGASTRODUODENOSCOPY     HERNIA REPAIR     INGUINAL HERNIA REPAIR Bilateral 02/07/2013   Procedure: LAPAROSCOPIC BILATERAL INGUINAL HERNIA REPAIR;  Surgeon: Imogene Burn. Georgette Dover, MD;  Location: [facility];  Service: General;  Laterality: Bilateral;   INSERTION OF MESH Bilateral 02/07/2013   Procedure: INSERTION OF MESH;  Surgeon: Imogene Burn. Georgette Dover, MD;  Location: [facility];  Service: General;  Laterality: Bilateral;   LEFT HEART CATH AND CORS/GRAFTS ANGIOGRAPHY N/A 05/27/2022   Procedure: LEFT HEART CATH AND CORS/GRAFTS ANGIOGRAPHY;  Surgeon: Jettie Booze, MD;  Location: [facility];  Service: Cardiovascular;  Laterality: N/A;   LUMBAR LAMINECTOMY/DECOMPRESSION MICRODISCECTOMY N/A 03/15/2018   Procedure: Complete decompressive lumbar laminectomy for spinal stenosis of L4-L5 and foraminotomy for L4-L5 root on right;  Surgeon: [name], MD;  Location: [facility];  Service: Orthopedics;  Laterality: N/A;   PACEMAKER INSERTION     SHOULDER SURGERY Bilateral    SKIN BIOPSY  05/04/2019   BASAL CELL CARCINOMA//MID CENTRAL NASAL   SKIN BIOPSY  10/11/2019   Superficial basal cell carcinoma - left superior central forehead shave    Family History  Problem Relation Age of Onset   Heart attack Father    Kidney cancer Father    Hypertension Mother    Diabetes Mother    Diabetes Brother    Epilepsy Sister    Diabetes Sister    Diabetes Brother    Colon cancer Neg Hx    Rectal cancer Neg Hx    Stomach cancer Neg Hx    Esophageal cancer Neg Hx     Allergies  Allergen  Reactions   Bee Venom Shortness Of Breath and Swelling   Codeine Phosphate Hives, Shortness Of Breath, Itching, Rash and Other (See Comments)    Tolerated multiple doses of IV morphine   Metoprolol Tartrate Other (See Comments)    loss of vision. Can take the XL tablet not regular    Pramipexole Other (See Comments)  Hallucinations     Tramadol     Pt has hallucinations    Ramipril Rash and Cough    Current Outpatient Medications on File Prior to Visit  Medication Sig Dispense Refill   Accu-Chek FastClix Lancets MISC USE TO CHECK BLOOD SUGAR TWICE A DAY AND AS NEEDED 306 each 2   acetaminophen (TYLENOL) 650 MG CR tablet Take 1,300 mg by mouth 2 (two) times daily.     albuterol (VENTOLIN HFA) 108 (90 Base) MCG/ACT inhaler INHALE 2 PUFFS INTO THE LUNGS EVERY 6 HOURS AS NEEDED FOR WHEEZING OR SHORTNESS OF BREATH. 3 each 0   Ascorbic Acid (VITAMIN C) 1000 MG tablet Take 1,000 mg by mouth daily.     aspirin 81 MG tablet Take 81 mg by mouth every evening.      atorvastatin (LIPITOR) 80 MG tablet Take 1 tablet (80 mg total) by mouth daily. 90 tablet 1   Blood Glucose Monitoring Suppl (ACCU-CHEK GUIDE) w/Device KIT Use as directed 1 kit 0   calcium carbonate (OS-CAL) 600 MG TABS Take 600 mg by mouth 2 (two) times daily with a meal.     carbidopa-levodopa (SINEMET CR) 50-200 MG tablet Take 1 tablet by mouth at bedtime.     carbidopa-levodopa (SINEMET IR) 25-100 MG tablet Take 1 tablet by mouth 4 (four) times daily.     cetirizine (ZYRTEC) 10 MG tablet Take 10 mg by mouth daily.      cholecalciferol (VITAMIN D) 25 MCG (1000 UNIT) tablet Take 1,000 Units by mouth daily.     clopidogrel (PLAVIX) 75 MG tablet Take 1 tablet (75 mg total) by mouth daily. 90 tablet 1   fluticasone (FLONASE) 50 MCG/ACT nasal spray Place 2 sprays into both nostrils daily. 16 g 6   furosemide (LASIX) 40 MG tablet TAKE 1 TABLET EVERY DAY 90 tablet 1   glucose blood (ACCU-CHEK SMARTVIEW) test strip Use as instructed 300  strip 2   losartan (COZAAR) 100 MG tablet TAKE 1 TABLET EVERY DAY (PLEASE MAKE OVERDUE APPT WITH DR. Lovena Le BEFORE ANY MORE REFILLS, 2ND ATTEMPT, CALL [phone]) 90 tablet 1   melatonin 5 MG TABS Take 5 mg by mouth at bedtime.     metFORMIN (GLUCOPHAGE) 500 MG tablet TAKE 1/2 TABLET EVERY DAY WITH BREAKFAST - SCHEDULE APPT FOR FUTURE REFILLS 45 tablet 0   metoprolol succinate (TOPROL-XL) 100 MG 24 hr tablet TAKE 1 TABLET EVERY DAY 90 tablet 3   Misc Natural Products (TART CHERRY ADVANCED) CAPS Take 2 capsules by mouth daily.      Multiple Vitamin (MULTIVITAMIN) tablet Take 1 tablet by mouth daily.       nitroGLYCERIN (NITROSTAT) 0.4 MG SL tablet Place 1 tablet (0.4 mg total) under the tongue every 5 (five) minutes as needed. For chest pain 75 tablet 2   potassium chloride (KLOR-CON) 10 MEQ tablet TAKE 1 TABLET EVERY DAY 90 tablet 2   Probiotic Product (PHILLIPS COLON HEALTH PO) Take 1 capsule by mouth daily.     vitamin B-12 (CYANOCOBALAMIN) 1000 MCG tablet Take 1,000 mcg by mouth daily.     warfarin (COUMADIN) 10 MG tablet TAKE 1/2 TO 1 TABLET DAILY OR AS DIRECTED BY COUMADIN CLINIC 90 tablet 1   No current facility-administered medications on file prior to visit.    BP 120/80   Temp 98.1 F (36.7 C) (Oral)   Ht _0  (1.676 m)   Wt 200 lb (90.7 kg)   BMI 32.28 kg/m  Objective:   Physical Exam Vitals and nursing note reviewed.  Constitutional:      General: He is not in acute distress.    Appearance: Normal appearance. He is well-developed and normal weight.  HENT:     Head: Normocephalic and atraumatic.     Right Ear: Tympanic membrane, ear canal and external ear normal. There is no impacted cerumen.     Left Ear: Tympanic membrane, ear canal and external ear normal. There is no impacted cerumen.     Nose: Nose normal. No congestion or rhinorrhea.     Mouth/Throat:     Mouth: Mucous membranes are moist.     Pharynx: Oropharynx is clear. No oropharyngeal exudate or  posterior oropharyngeal erythema.  Eyes:     General:        Right eye: No discharge.        Left eye: No discharge.     Extraocular Movements: Extraocular movements intact.     Conjunctiva/sclera: Conjunctivae normal.     Pupils: Pupils are equal, round, and reactive to light.  Neck:     Vascular: No carotid bruit.     Trachea: No tracheal deviation.  Cardiovascular:     Rate and Rhythm: Normal rate and regular rhythm.     Pulses: Normal pulses.     Heart sounds: Normal heart sounds. No murmur heard.    No friction rub. No gallop.  Pulmonary:     Effort: Pulmonary effort is normal. No respiratory distress.     Breath sounds: Normal breath sounds. No stridor. No wheezing, rhonchi or rales.  Chest:     Chest wall: No tenderness.  Abdominal:     General: Bowel sounds are normal. There is no distension.     Palpations: Abdomen is soft. There is no mass.     Tenderness: There is no abdominal tenderness. There is no right CVA tenderness, left CVA tenderness, guarding or rebound.     Hernia: No hernia is present.  Musculoskeletal:        General: No swelling, tenderness, deformity or signs of injury. Normal range of motion.     Right lower leg: No edema.     Left lower leg: No edema.  Lymphadenopathy:     Cervical: No cervical adenopathy.  Skin:    General: Skin is warm and dry.     Capillary Refill: Capillary refill takes Jeremy than 2 seconds.     Coloration: Skin is not jaundiced or pale.     Findings: No bruising, erythema, lesion or rash.  Neurological:     General: No focal deficit present.     Mental Status: He is alert and oriented to person, place, and time.     Cranial Nerves: No cranial nerve deficit.     Sensory: No sensory deficit.     Motor: Weakness, tremor and abnormal muscle tone present.     Coordination: Coordination normal.     Gait: Gait abnormal (walks with cane).     Deep Tendon Reflexes: Reflexes normal.  Psychiatric:        Mood and Affect: Mood normal.         Behavior: Behavior normal.        Thought Content: Thought content normal.        Judgment: Judgment normal.       Assessment & Plan:  1. Routine general medical examination at a health care facility - Follow up in one year or sooner if needed  2. NSTEMI (non-ST  elevated myocardial infarction) [facility]) - Per cardiology  3. Diabetes mellitus without complication ([place]) - Continue with current medication therapy. Most recent A1c done during hospital admission  - Follow up in 6 months  - CBC with Differential/Platelet; Future - Comprehensive metabolic panel; Future - TSH; Future - Microalbumin/Creatinine Ratio, Urine; Future - Microalbumin/Creatinine Ratio, Urine - TSH - Comprehensive metabolic panel - CBC with Differential/Platelet  4. Essential hypertension - Controlled. No change in medications  - CBC with Differential/Platelet; Future - Comprehensive metabolic panel; Future - TSH; Future - Microalbumin/Creatinine Ratio, Urine; Future - Microalbumin/Creatinine Ratio, Urine - TSH - Comprehensive metabolic panel - CBC with Differential/Platelet  5. Dyslipidemia - Continue statin  - CBC with Differential/Platelet; Future - Comprehensive metabolic panel; Future - TSH; Future - TSH - Comprehensive metabolic panel - CBC with Differential/Platelet  6. Coronary artery disease involving native coronary artery of native heart without angina pectoris - Continue statin, ASA, Plavix, and Coumadin  - CBC with Differential/Platelet; Future - Comprehensive metabolic panel; Future - TSH; Future - TSH - Comprehensive metabolic panel - CBC with Differential/Platelet  7. Aortic valve replaced - Per cardiology  - CBC with Differential/Platelet; Future - Comprehensive metabolic panel; Future - TSH; Future - TSH - Comprehensive metabolic panel - CBC with Differential/Platelet  8. AV BLOCK, COMPLETE - Per Cardiology  - CBC with Differential/Platelet; Future -  Comprehensive metabolic panel; Future - TSH; Future - TSH - Comprehensive metabolic panel - CBC with Differential/Platelet  9. Primary osteoarthritis of both knees - Continue with orthopedics   10. Warfarin anticoagulation   11. Parkinson's disease with dyskinesia and fluctuating manifestations - Per Neurology   [name], NP

## 2022-06-18 ENCOUNTER — Other Ambulatory Visit: Payer: Self-pay | Admitting: Adult Health

## 2022-06-21 ENCOUNTER — Ambulatory Visit: Payer: Medicare HMO | Attending: Cardiology | Admitting: *Deleted

## 2022-06-21 DIAGNOSIS — Z09 Encounter for follow-up examination after completed treatment for conditions other than malignant neoplasm: Secondary | ICD-10-CM | POA: Diagnosis not present

## 2022-06-21 DIAGNOSIS — Z5181 Encounter for therapeutic drug level monitoring: Secondary | ICD-10-CM

## 2022-06-21 DIAGNOSIS — Z952 Presence of prosthetic heart valve: Secondary | ICD-10-CM | POA: Diagnosis not present

## 2022-06-21 LAB — POCT INR: INR: 2.5 (ref 2.0–3.0)

## 2022-06-21 NOTE — Patient Instructions (Addendum)
Description   Continue taking warfarin 1 tablet daily except for 1/2 tablet on Sundays, Tuesdays, and Thursdays. Remain consistent with green leafy veggies.  Recheck INR in 4 weeks.  Coumadin Clinic [phone]

## 2022-07-11 DIAGNOSIS — E1159 Type 2 diabetes mellitus with other circulatory complications: Secondary | ICD-10-CM | POA: Diagnosis not present

## 2022-07-11 DIAGNOSIS — I10 Essential (primary) hypertension: Secondary | ICD-10-CM

## 2022-07-19 NOTE — Progress Notes (Unsigned)
Cardiology Office Note:    Date:  07/20/2022   ID:  [name], DOB 07/21/1945, MRN [id number]  PCP:  [name], NP  [facility] Cardiologist:  Candee Furbish, MD Electrophysiologist:  Cristopher Peru, MD     Referring MD: [name], NP   Chief Complaint:  F/u for CAD    Patient Profile: Coronary artery disease  S/p CABG in 2005 NSTEMI >> CABG could not be done >>PCI - S/p orbital atherectomy and 3.5 x 19 mm DES to mLM to oLAD and 2.5 x 30 mm DES to pLCx and OM2 on 05/28/22 Cath - mid LM 75; pLAD 90; mLAD 100; pLCx 90; mLCx 25; OM2 95, 50; mRCA 25; L-LAD ok; S-OM2 100 Aortic stenosis S/p Bentall with aortic root replacement and St Jude mechanical AVR  TTE 05/26/22: EF 50-55, apical AK, mild LVH, Gr 1 DD, normal RVSF, normally functioning mech AVR, RAP 3 Complete heart block S/p Pacemaker Thoracic aortic aneurysm  CT 05/03/22: dist desc Ao 4.5 cm  Hypertension  Hyperlipidemia Diabetes mellitus  Parkinson's Disease Carotid artery disease Carotid US 04/15/14: Bilat 1-39     History of Present Illness:   [name] is a 77 y.o. male with the above problem list.  He was last seen in clinic by Mayra Reel, NP on 06/02/22 for post hospital f/u after recent NSTEMI and 2 v PCI.  He had a couple episodes of recurrent chest pain after DC. He returns for f/u.   He is here today with his wife. He notes that he has been doing well w/o any symptoms until this morning. He was getting ready and developed substernal chest pain that is described as cold or an ache. It lasted about 15 mins. He took NTG with relief. He has had a chest ache from time to time (when going outside in the cold weather) since his CABG/AVR. His symptoms this morning were like his angina but not as severe. He has not had shortness of breath, syncope. He sleeps in a recliner chronically. He has mainly dependent edema.      EKG:  A sensed, V paced, HR 73    Reviewed and updated this  encounter:   Tobacco  Allergies  Meds  Problems  Med Hx  Surg Hx  Fam Hx     Review of Systems  Constitutional: Negative for fever.  Respiratory:  Positive for cough (non-productive; chronic).   Gastrointestinal:  Negative for hematochezia.  Genitourinary:  Negative for hematuria.    Labs/Other Test Reviewed:   Recent Labs: 05/25/2022: Magnesium 2.0 06/17/2022: ALT 12; BUN 20; Creatinine, Ser 0.92; Hemoglobin 13.5; Platelets 238.0; Potassium 3.7; Sodium 140; TSH 1.04  Recent Lipid Panel Recent Labs    05/26/22 0835 06/10/22 1051  CHOL 149 132  TRIG 110 128  HDL 48 46  VLDL 22  --   LDLCALC 79 63    Risk Assessment/Calculations/Metrics:             Physical Exam:   VS:  BP (!) 103/58   Pulse 73   Ht 5' 6.5" (1.689 m)   Wt 206 lb (93.4 kg)   SpO2 93%   BMI 32.75 kg/m    Wt Readings from Last 3 Encounters:  07/20/22 206 lb (93.4 kg)  06/17/22 200 lb (90.7 kg)  06/17/22 200 lb (90.7 kg)    Constitutional:      Appearance: Healthy appearance. Not in distress.  Neck:     Vascular: JVD normal.  Pulmonary:     Breath sounds: Normal breath sounds. No wheezing. No rales.  Cardiovascular:     Normal rate. Regular rhythm. Normal S1. S2. Mechanical       Murmurs: There is no murmur.  Edema:    Peripheral edema present.    Pretibial: bilateral 1+ edema of the pretibial area.    Ankle: bilateral 1+ edema of the ankle. Abdominal:     Palpations: Abdomen is soft.         ASSESSMENT & PLAN:   Coronary artery disease involving native coronary artery of native heart with angina pectoris ([place]) History of CABG in 2005 and non-STEMI in November 2023 treated with a DES to the mid left main to ostial LAD and DES to the proximal LCx to the OM2.  He has had chronic chest symptoms since his bypass and valve surgery in 2005.  He had some recurrent symptoms after his recent PCI.  Most recently, he had chest discomfort this morning prior to coming here today.  This lasted about 15  minutes and he did take nitroglycerin with relief.  His electrocardiogram is not interpretable as he is V paced.  We discussed the pros and cons of obtaining a high-sensitivity troponin while he is here today.  I noted that he would have to go to the emergency room if his troponin is elevated.  He declines to have this done today.  We discussed the risks of not doing this while he is here today.  He understands this and is willing to proceed without checking a troponin.  He understands that if he has recurrent symptoms after leaving here today or in the next couple of days, he should go to the emergency room.  Continue Lipitor 80 mg daily, Plavix 75 mg daily, Toprol-XL 100 mg daily, prn Nitroglycerin.  Follow-up in 4 months.  Aortic stenosis S/p mechanical AVR in 2005.  AVR functioning normally on echocardiogram in November 2023.  Continue SBE prophylaxis.  Continue Coumadin with monitoring by our Coumadin clinic.  Essential hypertension Blood pressure controlled on current regimen which includes Toprol-XL 100 mg daily, losartan 100 mg daily.  Continue current therapy.  Hyperlipidemia LDL goal <70 LDL optimal on most recent lab work.  Continue current Rx with with Lipitor 80 mg daily.  s/p Pacemaker F/u with EP as planned.   Thoracic aortic aneurysm (HCC) Hx of Benatall with aortic root replacement in 2005. CT in 04/2022 with descending aorta 4.5 cm. This is followed with serial CT scans.            Dispo:  Return in about 4 months (around 11/18/2022) for Routine Follow Up w/ Dr. Marlou Porch.   Signed, Richardson Dopp, PA-C  07/20/2022 12:27 PM    [address], [place], [place]  [address] Phone: [phone]; Fax: [phone]

## 2022-07-20 ENCOUNTER — Ambulatory Visit (INDEPENDENT_AMBULATORY_CARE_PROVIDER_SITE_OTHER): Payer: Medicare HMO | Admitting: Pharmacist

## 2022-07-20 ENCOUNTER — Encounter: Payer: Self-pay | Admitting: Physician Assistant

## 2022-07-20 ENCOUNTER — Ambulatory Visit: Payer: Medicare HMO

## 2022-07-20 ENCOUNTER — Ambulatory Visit: Payer: Medicare HMO | Attending: Physician Assistant | Admitting: Physician Assistant

## 2022-07-20 VITALS — BP 103/58 | HR 73 | Ht 66.5 in | Wt 206.0 lb

## 2022-07-20 DIAGNOSIS — I10 Essential (primary) hypertension: Secondary | ICD-10-CM | POA: Diagnosis not present

## 2022-07-20 DIAGNOSIS — Z952 Presence of prosthetic heart valve: Secondary | ICD-10-CM | POA: Diagnosis not present

## 2022-07-20 DIAGNOSIS — E785 Hyperlipidemia, unspecified: Secondary | ICD-10-CM

## 2022-07-20 DIAGNOSIS — Z950 Presence of cardiac pacemaker: Secondary | ICD-10-CM

## 2022-07-20 DIAGNOSIS — I25119 Atherosclerotic heart disease of native coronary artery with unspecified angina pectoris: Secondary | ICD-10-CM | POA: Diagnosis not present

## 2022-07-20 DIAGNOSIS — Z5181 Encounter for therapeutic drug level monitoring: Secondary | ICD-10-CM

## 2022-07-20 DIAGNOSIS — I712 Thoracic aortic aneurysm, without rupture, unspecified: Secondary | ICD-10-CM | POA: Insufficient documentation

## 2022-07-20 DIAGNOSIS — I2510 Atherosclerotic heart disease of native coronary artery without angina pectoris: Secondary | ICD-10-CM

## 2022-07-20 DIAGNOSIS — I7123 Aneurysm of the descending thoracic aorta, without rupture: Secondary | ICD-10-CM

## 2022-07-20 DIAGNOSIS — I350 Nonrheumatic aortic (valve) stenosis: Secondary | ICD-10-CM | POA: Diagnosis not present

## 2022-07-20 LAB — POCT INR: INR: 2.6 (ref 2.0–3.0)

## 2022-07-20 NOTE — Assessment & Plan Note (Signed)
Blood pressure controlled on current regimen which includes Toprol-XL 100 mg daily, losartan 100 mg daily.  Continue current therapy.

## 2022-07-20 NOTE — Assessment & Plan Note (Signed)
LDL optimal on most recent lab work.  Continue current Rx with with Lipitor 80 mg daily.

## 2022-07-20 NOTE — Assessment & Plan Note (Signed)
S/p mechanical AVR in 2005.  AVR functioning normally on echocardiogram in November 2023.  Continue SBE prophylaxis.  Continue Coumadin with monitoring by our Coumadin clinic.

## 2022-07-20 NOTE — Patient Instructions (Signed)
Continue taking warfarin 1 tablet daily except for 1/2 tablet on Sundays, Tuesdays, and Thursdays. Remain consistent with green leafy veggies.  Recheck INR in 4 weeks.  [facility] [phone]

## 2022-07-20 NOTE — Assessment & Plan Note (Signed)
History of CABG in 2005 and non-STEMI in November 2023 treated with a DES to the mid left main to ostial LAD and DES to the proximal LCx to the OM2.  He has had chronic chest symptoms since his bypass and valve surgery in 2005.  He had some recurrent symptoms after his recent PCI.  Most recently, he had chest discomfort this morning prior to coming here today.  This lasted about 15 minutes and he did take nitroglycerin with relief.  His electrocardiogram is not interpretable as he is V paced.  We discussed the pros and cons of obtaining a high-sensitivity troponin while he is here today.  I noted that he would have to go to the emergency room if his troponin is elevated.  He declines to have this done today.  We discussed the risks of not doing this while he is here today.  He understands this and is willing to proceed without checking a troponin.  He understands that if he has recurrent symptoms after leaving here today or in the next couple of days, he should go to the emergency room.  Continue Lipitor 80 mg daily, Plavix 75 mg daily, Toprol-XL 100 mg daily, prn Nitroglycerin.  Follow-up in 4 months.

## 2022-07-20 NOTE — Assessment & Plan Note (Signed)
F/u with EP as planned.

## 2022-07-20 NOTE — Assessment & Plan Note (Signed)
Hx of Benatall with aortic root replacement in 2005. CT in 04/2022 with descending aorta 4.5 cm. This is followed with serial CT scans.

## 2022-07-20 NOTE — Patient Instructions (Signed)
Medication Instructions:  Your physician recommends that you continue on your current medications as directed. Please refer to the Current Medication list given to you today.  *If you need a refill on your cardiac medications before your next appointment, please call your pharmacy*   Lab Work: None ordered  If you have labs (blood work) drawn today and your tests are completely normal, you will receive your results only by: [place] (if you have MyChart) OR A paper copy in the mail If you have any lab test that is abnormal or we need to change your treatment, we will call you to review the results.   Testing/Procedures: None ordered   Follow-Up: At [facility], you and your health needs are our priority.  As part of our continuing mission to provide you with exceptional heart care, we have created designated Provider Care Teams.  These Care Teams include your primary Cardiologist (physician) and Advanced Practice Providers (APPs -  Physician Assistants and Nurse Practitioners) who all work together to provide you with the care you need, when you need it.  We recommend signing up for the patient portal called "MyChart".  Sign up information is provided on this After Visit Summary.  MyChart is used to connect with patients for Virtual Visits (Telemedicine).  Patients are able to view lab/test results, encounter notes, upcoming appointments, etc.  Non-urgent messages can be sent to your provider as well.   To learn more about what you can do with MyChart, go to [email or web address].    Your next appointment:   4 month(s)  The format for your next appointment:   In Person  Provider:   Candee Furbish, MD     Other Instructions   Important Information About Sugar

## 2022-07-30 DIAGNOSIS — M25562 Pain in left knee: Secondary | ICD-10-CM | POA: Diagnosis not present

## 2022-07-30 DIAGNOSIS — M25561 Pain in right knee: Secondary | ICD-10-CM | POA: Diagnosis not present

## 2022-08-05 ENCOUNTER — Ambulatory Visit (INDEPENDENT_AMBULATORY_CARE_PROVIDER_SITE_OTHER): Payer: Medicare HMO

## 2022-08-05 DIAGNOSIS — I442 Atrioventricular block, complete: Secondary | ICD-10-CM | POA: Diagnosis not present

## 2022-08-05 LAB — CUP PACEART REMOTE DEVICE CHECK
Battery Remaining Longevity: 52 mo
Battery Remaining Percentage: 43 %
Battery Voltage: 2.96 V
Brady Statistic AP VP Percent: 30 %
Brady Statistic AP VS Percent: 1 %
Brady Statistic AS VP Percent: 70 %
Brady Statistic AS VS Percent: 1 %
Brady Statistic RA Percent Paced: 29 %
Brady Statistic RV Percent Paced: 99 %
Date Time Interrogation Session: 20240125020016
Implantable Lead Connection Status: 753985
Implantable Lead Connection Status: 753985
Implantable Lead Implant Date: 20050816
Implantable Lead Implant Date: 20050816
Implantable Lead Location: 753859
Implantable Lead Location: 753860
Implantable Pulse Generator Implant Date: 20171026
Lead Channel Impedance Value: 460 Ohm
Lead Channel Impedance Value: 690 Ohm
Lead Channel Pacing Threshold Amplitude: 0.5 V
Lead Channel Pacing Threshold Amplitude: 1.125 V
Lead Channel Pacing Threshold Pulse Width: 0.5 ms
Lead Channel Pacing Threshold Pulse Width: 0.5 ms
Lead Channel Sensing Intrinsic Amplitude: 12 mV
Lead Channel Sensing Intrinsic Amplitude: 2.4 mV
Lead Channel Setting Pacing Amplitude: 0.75 V
Lead Channel Setting Pacing Amplitude: 2.125
Lead Channel Setting Pacing Pulse Width: 0.5 ms
Lead Channel Setting Sensing Sensitivity: 4 mV
Pulse Gen Model: 2272
Pulse Gen Serial Number: 3182792

## 2022-08-08 ENCOUNTER — Other Ambulatory Visit: Payer: Self-pay | Admitting: Adult Health

## 2022-08-08 ENCOUNTER — Other Ambulatory Visit: Payer: Self-pay | Admitting: Cardiology

## 2022-08-09 DIAGNOSIS — H25813 Combined forms of age-related cataract, bilateral: Secondary | ICD-10-CM | POA: Diagnosis not present

## 2022-08-09 LAB — HM DIABETES EYE EXAM

## 2022-08-10 DIAGNOSIS — G629 Polyneuropathy, unspecified: Secondary | ICD-10-CM | POA: Diagnosis not present

## 2022-08-17 ENCOUNTER — Ambulatory Visit: Payer: Medicare HMO | Attending: Internal Medicine | Admitting: *Deleted

## 2022-08-17 DIAGNOSIS — Z952 Presence of prosthetic heart valve: Secondary | ICD-10-CM | POA: Diagnosis not present

## 2022-08-17 DIAGNOSIS — Z09 Encounter for follow-up examination after completed treatment for conditions other than malignant neoplasm: Secondary | ICD-10-CM

## 2022-08-17 DIAGNOSIS — Z5181 Encounter for therapeutic drug level monitoring: Secondary | ICD-10-CM | POA: Diagnosis not present

## 2022-08-17 LAB — POCT INR: INR: 2.4 (ref 2.0–3.0)

## 2022-08-17 NOTE — Patient Instructions (Signed)
Description   Continue taking warfarin 1 tablet daily except for 1/2 tablet on Sundays, Tuesdays, and Thursdays. Remain consistent with green leafy veggies. Recheck INR in 6 weeks.  C[facility]madin Clinic [phone]

## 2022-08-24 NOTE — Progress Notes (Signed)
Remote pacemaker transmission.   

## 2022-09-13 ENCOUNTER — Telehealth: Payer: Self-pay | Admitting: *Deleted

## 2022-09-13 DIAGNOSIS — H25811 Combined forms of age-related cataract, right eye: Secondary | ICD-10-CM | POA: Diagnosis not present

## 2022-09-13 DIAGNOSIS — H25812 Combined forms of age-related cataract, left eye: Secondary | ICD-10-CM | POA: Diagnosis not present

## 2022-09-13 DIAGNOSIS — H35372 Puckering of macula, left eye: Secondary | ICD-10-CM | POA: Diagnosis not present

## 2022-09-13 DIAGNOSIS — H25813 Combined forms of age-related cataract, bilateral: Secondary | ICD-10-CM | POA: Diagnosis not present

## 2022-09-13 NOTE — Telephone Encounter (Signed)
   Patient Name: [name]  DOB: August 13, 1945 MRN: [id number]  Primary Cardiologist: Candee Furbish, MD  Chart reviewed as part of pre-operative protocol coverage. Cataract extractions are recognized in guidelines as low risk surgeries that do not typically require specific preoperative testing or holding of blood thinner therapy. Therefore, given past medical history and time since last visit, based on ACC/AHA guidelines, [name] would be at acceptable risk for the planned procedure without further cardiovascular testing.   I will route this recommendation to the requesting party via Epic fax function and remove from pre-op pool.  Please call with questions.  Deberah Pelton, NP 09/13/2022, 1:28 PM

## 2022-09-13 NOTE — Telephone Encounter (Signed)
   Pre-operative Risk Assessment    Patient Name: [name]  DOB: 1946-06-11 MRN: [id number]      Request for Surgical Clearance    Procedure:   CATARACT EXTRACTION BY PE, IOL-LEFT  EYE  Date of Surgery:  Clearance 09/30/22                                 Surgeon:  DR. Julian Reil Surgeon's Group or Practice Name:  [facility]  Phone number:  [phone] [phone] Fax number:  [phone]   Type of Clearance Requested:   - Medical ; PER THE CLEARANCE REQUEST NO MEDICATIONS ARE NEEDING TO BE HELD   Type of Anesthesia:  Local    Additional requests/questions:    Jiles Prows   09/13/2022, 1:02 PM

## 2022-09-21 ENCOUNTER — Encounter: Payer: Self-pay | Admitting: Cardiology

## 2022-09-21 MED ORDER — ATORVASTATIN CALCIUM 80 MG PO TABS: 80.0000 mg | ORAL_TABLET | Freq: Every day | ORAL | 3 refills | Status: DC

## 2022-09-21 MED ORDER — CLOPIDOGREL BISULFATE 75 MG PO TABS: 75.0000 mg | ORAL_TABLET | Freq: Every day | ORAL | 3 refills | Status: DC

## 2022-09-28 ENCOUNTER — Ambulatory Visit: Payer: Medicare HMO | Attending: Cardiovascular Disease | Admitting: *Deleted

## 2022-09-28 DIAGNOSIS — Z952 Presence of prosthetic heart valve: Secondary | ICD-10-CM

## 2022-09-28 DIAGNOSIS — Z09 Encounter for follow-up examination after completed treatment for conditions other than malignant neoplasm: Secondary | ICD-10-CM | POA: Diagnosis not present

## 2022-09-28 DIAGNOSIS — Z5181 Encounter for therapeutic drug level monitoring: Secondary | ICD-10-CM | POA: Diagnosis not present

## 2022-09-28 LAB — POCT INR: INR: 2.8 (ref 2.0–3.0)

## 2022-09-28 NOTE — Patient Instructions (Signed)
Description   Continue taking warfarin 1 tablet daily except for 1/2 tablet on Sundays, Tuesdays, and Thursdays. Remain consistent with green leafy veggies. Recheck INR in 6 weeks.  Coumadin Clinic [phone]     

## 2022-09-29 ENCOUNTER — Other Ambulatory Visit: Payer: Self-pay | Admitting: Cardiology

## 2022-09-30 DIAGNOSIS — H269 Unspecified cataract: Secondary | ICD-10-CM | POA: Diagnosis not present

## 2022-09-30 DIAGNOSIS — H25812 Combined forms of age-related cataract, left eye: Secondary | ICD-10-CM | POA: Diagnosis not present

## 2022-10-14 DIAGNOSIS — H269 Unspecified cataract: Secondary | ICD-10-CM | POA: Diagnosis not present

## 2022-10-14 DIAGNOSIS — H25811 Combined forms of age-related cataract, right eye: Secondary | ICD-10-CM | POA: Diagnosis not present

## 2022-10-21 ENCOUNTER — Telehealth: Payer: Self-pay

## 2022-10-21 NOTE — Progress Notes (Signed)
Care Management & Coordination Services Pharmacy Team  Reason for Encounter: Hypertension and Diabetes  Contacted patient to discuss hypertension disease state. Spoke with family wife on 10/21/2022     Current antihypertensive regimen:  Sinemet CR 50/200 mg 1 tablet at bedtime Losartan 100 mg 1 tablet at bedtime Metoprolol 100 mg 1 tablet daily Current antihyperglycemic regimen:  Metformin 500 mg 1/2 tablet daily  Patient verbally confirms he is taking the above medications as directed. Yes  How often are you checking your Blood Pressure? Patients wife is checking 3 times per week  he checks his blood pressure at various times, most readings are in the mornings.   Current home BP readings:  DATE:             BP                10/21/2022 123/72 10/18/2022 120/79 10/15/2022 118/69 10/12/2022 98/56  Wrist or arm cuff: Arm cuff  OTC medications including pseudoephedrine or NSAIDs? Patients wife denies  Any readings above 180/100? Patients wife denies  Patient denies hypoglycemic symptoms, including None  Patient denies hyperglycemic symptoms, including none  How often are you checking your blood sugar? once daily  What are your blood sugars ranging?  Fasting: 90-120  During the week, how often does your blood glucose drop below 70? Never  Are you checking your feet daily/regularly? Yes, patients wife checks daily  What recent interventions/DTPs have been made by any provider to improve Hypertension and Diabetic control since last CPP Visit: No recent interventions  Any recent hospitalizations or ED visits since last visit with CPP? No recent hospital visits  What diet changes have been made to improve Hypertension and Diabetic Control?  Patient follows no specific diet  Breakfast - patient will have an egg, bacon, oatmeal, tomato juice  Lunch - patient will have a sandwich Dinner - patient will have a meat, vegetable and starch Caffeine intake - coffee in the  AM Salt intake- low sodium  What exercise is being done to improve your Hypertension and Diabetic Control?  Patient states he is out with his dog several times per day   Adherence Review: Is the patient currently on a STATIN medication? Yes Is the patient currently on ACE/ARB medication? Yes Does the patient have >5 day gap between last estimated fill dates? No  Care Gaps: AWV - scheduled 06/17/2022 Last eye exam - 08/09/2022 Last foot exam - 06/17/2022 Last BP - 103/58 on 07/20/2022 Last A1C - 6.4 on 05/26/2023 Covid - never done Tdap - overdue   Star Rating Drug: Atorvastatin 80 mg - last [facility]21/2022 [facility] Losartan 100 mg - last filled 09/29/2022 [facility] Metformin 500 mg - last [facility]/29/2022 [facility]  Chart Updates: Recent office visits:  None  Recent consult visits:  03/05/2022 Peter [place] (card) - Patient was seen for Presence of prosthetic heart valve and additional concerns. No additional chart notes.   02/08/2022 Oleh Geninlukayode Onasanya (neurology) - Patient was seen for parkinson's disease and and additional concern. No additional chart notes.   Hospital visits:  None  Medications: Outpatient Encounter Medications as of 10/21/2022  Medication Sig   ACCU-CHEK GUIDE test strip USE AS INSTRUCTED   Accu-Chek Softclix Lancets lancets USE TO CHECK BLOOD SUGAR TWICE A DAY AND AS NEEDED   acetaminophen (TYLENOL) 650 MG CR tablet Take 1,300 mg by mouth 2 (two) times daily.   albuterol (VENTOLIN HFA) 108 (90 Base) MCG/ACT  inhaler INHALE 2 PUFFS INTO THE LUNGS EVERY 6 HOURS AS NEEDED FOR WHEEZING OR SHORTNESS OF BREATH.   Ascorbic Acid (VITAMIN C) 1000 MG tablet Take 1,000 mg by mouth daily.   atorvastatin (LIPITOR) 80 MG tablet Take 1 tablet (80 mg total) by mouth daily.   Blood Glucose Monitoring Suppl (ACCU-CHEK GUIDE) w/Device KIT Use as directed   calcium carbonate (OS-CAL) 600 MG TABS Take 600 mg by mouth 2 (two) times daily with a meal.    carbidopa-levodopa (SINEMET CR) 50-200 MG tablet Take 1 tablet by mouth at bedtime.   carbidopa-levodopa (SINEMET IR) 25-100 MG tablet Take 1 tablet by mouth 4 (four) times daily.   cetirizine (ZYRTEC) 10 MG tablet Take 10 mg by mouth daily.    cholecalciferol (VITAMIN D) 25 MCG (1000 UNIT) tablet Take 1,000 Units by mouth daily.   clopidogrel (PLAVIX) 75 MG tablet Take 1 tablet (75 mg total) by mouth daily.   fluticasone (FLONASE) 50 MCG/ACT nasal spray Place 2 sprays into both nostrils daily.   furosemide (LASIX) 40 MG tablet TAKE 1 TABLET EVERY DAY   losartan (COZAAR) 100 MG tablet TAKE 1 TABLET EVERY DAY (PLEASE MAKE OVERDUE APPT WITH DR. Ladona Ridgel BEFORE ANY MORE REFILLS, 2ND ATTEMPT, CALL [phone])   melatonin 5 MG TABS Take 5 mg by mouth at bedtime.   metFORMIN (GLUCOPHAGE) 500 MG tablet TAKE 1/2 TABLET EVERY DAY WITH BREAKFAST - SCHEDULE APPT FOR FUTURE REFILLS   metoprolol succinate (TOPROL-XL) 100 MG 24 hr tablet TAKE 1 TABLET EVERY DAY   Misc Natural Products (TART CHERRY ADVANCED) CAPS Take 2 capsules by mouth daily.    Multiple Vitamin (MULTIVITAMIN) tablet Take 1 tablet by mouth daily.     nitroGLYCERIN (NITROSTAT) 0.4 MG SL tablet PLACE 1 TABLET (0.4 MG TOTAL) UNDER THE TONGUE EVERY 5 (FIVE) MINUTES AS NEEDED. FOR CHEST PAIN   potassium chloride (KLOR-CON) 10 MEQ tablet TAKE 1 TABLET EVERY DAY   Probiotic Product (PHILLIPS COLON HEALTH PO) Take 1 capsule by mouth daily.   vitamin B-12 (CYANOCOBALAMIN) 1000 MCG tablet Take 1,000 mcg by mouth daily.   warfarin (COUMADIN) 10 MG tablet TAKE 1/2 TO 1 TABLET DAILY OR AS DIRECTED BY COUMADIN CLINIC   No facility-administered encounter medications on file as of 10/21/2022.  Fill History:  Dispensed Days Supply Quantity Provider Pharmacy  albuterol sulfate HFA 90 mcg/actuation aerosol inhaler 10/06/2021 75 20.1 g      Dispensed Days Supply Quantity Provider Pharmacy  atorvastatin 80 mg tablet 09/21/2022 90 90 tablet      Dispensed  Days Supply Quantity Provider Pharmacy  carbidopa 25 mg-levodopa 100 mg tablet 08/08/2022 90 360 tablet      Dispensed Days Supply Quantity Provider Pharmacy  CLOPIDOGREL 75MG     TAB 08/15/2022 90 90 each      Dispensed Days Supply Quantity Provider Pharmacy  furosemide 40 mg tablet 09/02/2022 90 90 tablet      Dispensed Days Supply Quantity Provider Pharmacy  losartan 100 mg tablet 09/29/2022 90 90 tablet      Dispensed Days Supply Quantity Provider Pharmacy  metformin 500 mg tablet 09/29/2022 90 45 tablet      Dispensed Days Supply Quantity Provider Pharmacy  metoprolol succinate ER 100 mg tablet,extended release 24 hr 06/26/2022 90 90 tablet      Dispensed Days Supply Quantity Provider Pharmacy  potassium chloride ER 10 mEq tablet,extended release 09/02/2022 90 90 tablet      Dispensed Days Supply Quantity Provider Pharmacy  warfarin 10 mg  tablet 09/02/2022 90 90 tablet      Recent Office Vitals: BP Readings from Last 3 Encounters:  07/20/22 (!) 103/58  06/17/22 120/80  06/02/22 (!) 110/56   Pulse Readings from Last 3 Encounters:  07/20/22 73  06/17/22 75  06/02/22 84    Wt Readings from Last 3 Encounters:  07/20/22 206 lb (93.4 kg)  06/17/22 200 lb (90.7 kg)  06/17/22 200 lb (90.7 kg)     Kidney Function Lab Results  Component Value Date/Time   CREATININE 0.92 06/17/2022 10:37 AM   CREATININE 0.84 05/29/2022 01:44 AM   CREATININE 0.87 04/29/2016 10:11 AM   GFR 79.97 06/17/2022 10:37 AM   GFRNONAA >60 05/29/2022 01:44 AM   GFRAA 101 05/08/2020 10:15 AM       Latest Ref Rng & Units 06/17/2022   10:37 AM 05/29/2022    1:44 AM 05/28/2022   10:08 AM  BMP  Glucose 70 - 99 mg/dL 165  790  98   BUN 6 - 23 mg/dL 20  14  14    Creatinine 0.40 - 1.50 mg/dL 3.83  3.38  3.29   Sodium 135 - 145 mEq/L 140  136  138   Potassium 3.5 - 5.1 mEq/L 3.7  3.9  4.4   Chloride 96 - 112 mEq/L 103  104  106   CO2 19 - 32 mEq/L 31  25  21    Calcium 8.4 - 10.5 mg/dL 9.3  8.5   9.0    Inetta Fermo [facility]  Clinical Pharmacist Assistant [phone]

## 2022-10-25 ENCOUNTER — Other Ambulatory Visit: Payer: Self-pay | Admitting: Internal Medicine

## 2022-10-25 ENCOUNTER — Other Ambulatory Visit: Payer: Self-pay | Admitting: Adult Health

## 2022-11-01 DIAGNOSIS — G20A2 Parkinson's disease without dyskinesia, with fluctuations: Secondary | ICD-10-CM | POA: Diagnosis not present

## 2022-11-01 DIAGNOSIS — G629 Polyneuropathy, unspecified: Secondary | ICD-10-CM | POA: Diagnosis not present

## 2022-11-02 DIAGNOSIS — M13861 Other specified arthritis, right knee: Secondary | ICD-10-CM | POA: Diagnosis not present

## 2022-11-02 DIAGNOSIS — M13862 Other specified arthritis, left knee: Secondary | ICD-10-CM | POA: Diagnosis not present

## 2022-11-04 ENCOUNTER — Ambulatory Visit (INDEPENDENT_AMBULATORY_CARE_PROVIDER_SITE_OTHER): Payer: Medicare HMO

## 2022-11-04 DIAGNOSIS — I442 Atrioventricular block, complete: Secondary | ICD-10-CM

## 2022-11-04 LAB — CUP PACEART REMOTE DEVICE CHECK
Battery Remaining Longevity: 49 mo
Battery Remaining Percentage: 40 %
Battery Voltage: 2.96 V
Brady Statistic AP VP Percent: 31 %
Brady Statistic AP VS Percent: 1 %
Brady Statistic AS VP Percent: 69 %
Brady Statistic AS VS Percent: 1 %
Brady Statistic RA Percent Paced: 30 %
Brady Statistic RV Percent Paced: 99 %
Date Time Interrogation Session: 20240425020013
Implantable Lead Connection Status: 753985
Implantable Lead Connection Status: 753985
Implantable Lead Implant Date: 20050816
Implantable Lead Implant Date: 20050816
Implantable Lead Location: 753859
Implantable Lead Location: 753860
Implantable Pulse Generator Implant Date: 20171026
Lead Channel Impedance Value: 460 Ohm
Lead Channel Impedance Value: 690 Ohm
Lead Channel Pacing Threshold Amplitude: 0.5 V
Lead Channel Pacing Threshold Amplitude: 0.875 V
Lead Channel Pacing Threshold Pulse Width: 0.5 ms
Lead Channel Pacing Threshold Pulse Width: 0.5 ms
Lead Channel Sensing Intrinsic Amplitude: 11.7 mV
Lead Channel Sensing Intrinsic Amplitude: 2.4 mV
Lead Channel Setting Pacing Amplitude: 0.75 V
Lead Channel Setting Pacing Amplitude: 1.875
Lead Channel Setting Pacing Pulse Width: 0.5 ms
Lead Channel Setting Sensing Sensitivity: 4 mV
Pulse Gen Model: 2272
Pulse Gen Serial Number: 3182792

## 2022-11-09 ENCOUNTER — Ambulatory Visit: Payer: Medicare HMO | Attending: Cardiology | Admitting: *Deleted

## 2022-11-09 DIAGNOSIS — Z952 Presence of prosthetic heart valve: Secondary | ICD-10-CM | POA: Diagnosis not present

## 2022-11-09 DIAGNOSIS — Z5181 Encounter for therapeutic drug level monitoring: Secondary | ICD-10-CM | POA: Diagnosis not present

## 2022-11-09 LAB — POCT INR: INR: 1.9 — AB (ref 2.0–3.0)

## 2022-11-09 NOTE — Patient Instructions (Signed)
Description   Today take 1 tablet then continue taking warfarin 1 tablet daily except for 1/2 tablet on Sundays, Tuesdays, and Thursdays. Remain consistent with green leafy veggies. Recheck INR in 6 weeks.  Coumadin Clinic [phone] or [phone]

## 2022-11-19 ENCOUNTER — Encounter: Payer: Self-pay | Admitting: Cardiology

## 2022-11-19 ENCOUNTER — Ambulatory Visit: Payer: Medicare HMO | Attending: Cardiology | Admitting: Cardiology

## 2022-11-19 VITALS — BP 101/62 | HR 71 | Ht 66.5 in | Wt 190.0 lb

## 2022-11-19 DIAGNOSIS — I25119 Atherosclerotic heart disease of native coronary artery with unspecified angina pectoris: Secondary | ICD-10-CM

## 2022-11-19 DIAGNOSIS — E785 Hyperlipidemia, unspecified: Secondary | ICD-10-CM | POA: Diagnosis not present

## 2022-11-19 DIAGNOSIS — Z952 Presence of prosthetic heart valve: Secondary | ICD-10-CM

## 2022-11-19 DIAGNOSIS — G20B1 Parkinson's disease with dyskinesia, without mention of fluctuations: Secondary | ICD-10-CM | POA: Diagnosis not present

## 2022-11-19 DIAGNOSIS — I7123 Aneurysm of the descending thoracic aorta, without rupture: Secondary | ICD-10-CM

## 2022-11-19 MED ORDER — METOPROLOL SUCCINATE ER 50 MG PO TB24: 50.0000 mg | ORAL_TABLET | Freq: Every day | ORAL | 3 refills | Status: DC

## 2022-11-19 MED ORDER — LOSARTAN POTASSIUM 25 MG PO TABS: 25.0000 mg | ORAL_TABLET | Freq: Every day | ORAL | 3 refills | Status: DC

## 2022-11-19 NOTE — Patient Instructions (Signed)
Medication Instructions:  Please decrease Losartan to 25 mg a day and Metoprolol to 50 mg a day. Continue all other medications as listed.  *If you need a refill on your cardiac medications before your next appointment, please call your pharmacy*   Follow-Up: At [facility], you and your health needs are our priority.  As part of our continuing mission to provide you with exceptional heart care, we have created designated Provider Care Teams.  These Care Teams include your primary Cardiologist (physician) and Advanced Practice Providers (APPs -  Physician Assistants and Nurse Practitioners) who all work together to provide you with the care you need, when you need it.  We recommend signing up for the patient portal called "MyChart".  Sign up information is provided on this After Visit Summary.  MyChart is used to connect with patients for Virtual Visits (Telemedicine).  Patients are able to view lab/test results, encounter notes, upcoming appointments, etc.  Non-urgent messages can be sent to your provider as well.   To learn more about what you can do with MyChart, go to [email or web address].    Your next appointment:   1 year(s)  Provider:   Donato Schultz, MD

## 2022-11-19 NOTE — Progress Notes (Signed)
Cardiology Office Note:    Date:  11/19/2022   ID:  [name], DOB 14-Oct-1945, MRN [id number]  PCP:  Jeremy Frees, NP   [facility] Providers Cardiologist:  Jeremy Schultz, MD Electrophysiologist:  Jeremy Bunting, MD     Referring MD: Jeremy Frees, NP     History of Present Illness:    [name] is a 77 y.o. male former patient of Dr. Myrtis Whitney here for follow-up of CAD prior CABG pacemaker mechanical aortic valve.  Dr. Ladona Whitney has been monitoring his pacemaker and has been doing well, [facility].  His Parkinson's is slowly progressive.  Walking with cane, slow gait, tremor noted.  Has overall been feeling fairly well.  No return of chest discomfort.  His blood pressure has been fairly soft recently.  Denies any syncope bleeding.  No orthopnea.  Past Medical History:  Diagnosis Date   Acute renal failure (HCC) 02/09/2013   Aortic stenosis 08/25/2010   a. [place] aortic root replacement with a St. Jude mechanical valve and Hemashield conduit 02/2004.   Arthritis    Asthma    AV BLOCK, COMPLETE    a. s/p St Jude dual chamber pacemaker 02/2004.   CAD (coronary artery disease)    a. s/p CABGx2 (LIMA-dLAD, SVG-Cx). b. Low risk nuc 12/2012 without ischemia, EF 46% mild apical hypokinesia (EF 55% inf HK by echo).   Carotid artery disease (HCC)    a. 0-39% bilateral ICA stenosis, stable mild hard plaque in carotid bulbs. F/u 03/2014 recommended.   Diabetes mellitus without complication (HCC)    type 2    DIVERTICULOSIS, COLON 10/17/2007   Ejection fraction    a. EF 55% with inf HK, mild MR by echo 12/2012.   GERD (gastroesophageal reflux disease)    hxof    GOUT 01/16/2007   Headache    hx of migraines    HEMORRHOIDS, INTERNAL 11/01/2008   Hx of adenomatous polyp of colon 02/2019   no recall needed   HYPERLIPIDEMIA 01/16/2007   HYPERTENSION 01/16/2007   Impaired glucose tolerance    LEG EDEMA 11/27/2008   Special screening for malignant neoplasm of prostate    Warfarin  anticoagulation    AVR    Past Surgical History:  Procedure Laterality Date   AORTIC VALVE REPLACEMENT     CARDIAC CATHETERIZATION  06/2003   CIRCUMCISION     COLONOSCOPY     CORONARY ARTERY BYPASS GRAFT     CORONARY ATHERECTOMY N/A 05/28/2022   Procedure: CORONARY ATHERECTOMY;  Surgeon: Whitney, Jeremy M, MD;  Location: [facility] INVASIVE CV LAB;  Service: Cardiovascular;  Laterality: N/A;   CORONARY LITHOTRIPSY N/A 05/28/2022   Procedure: CORONARY LITHOTRIPSY;  Surgeon: Whitney, Jeremy M, MD;  Location[facility] INVASIVE CV LAB;  Service: Cardiovascular;  Laterality: N/A;   CORONARY STENT INTERVENTION N/A 05/28/2022   Procedure: CORONARY STENT INTERVENTION;  Surgeon: Whitney, Jeremy M, MD;  Location: [facility] INVASIVE CV LAB;  Service: Cardiovascular;  Laterality: N/A;   coronary stents      prior to bypass    EP IMPLANTABLE DEVICE N/A 05/06/2016   Procedure: PPM Generator Changeout;  Surgeon: Jeremy Maw, MD;  Location: [facility] INVASIVE CV LAB;  Service: Cardiovascular;  Laterality: N/A;   ESOPHAGOGASTRODUODENOSCOPY     HERNIA REPAIR     INGUINAL HERNIA REPAIR Bilateral 02/07/2013   Procedure: LAPAROSCOPIC BILATERAL INGUINAL HERNIA REPAIR;  Surgeon: Jeremy Arms. Corliss Drury Ardizzone, MD;  Location: [facility] ORS;  Service: General;  Laterality: Bilateral;   INSERTION OF MESH Bilateral  02/07/2013   Procedure: INSERTION OF MESH;  Surgeon: Jeremy Arms. Corliss , MD;  Location: [facility] ORS;  Service: General;  Laterality: Bilateral;   LEFT HEART CATH AND CORS/GRAFTS ANGIOGRAPHY N/A 05/27/2022   Procedure: LEFT HEART CATH AND CORS/GRAFTS ANGIOGRAPHY;  Surgeon: Jeremy Crafts, MD;  Location: [facility] INVASIVE CV LAB;  Service: Cardiovascular;  Laterality: N/A;   LUMBAR LAMINECTOMY/DECOMPRESSION MICRODISCECTOMY N/A 03/15/2018   Procedure: Complete decompressive lumbar laminectomy for spinal stenosis of L4-L5 and foraminotomy for L4-L5 root on right;  Surgeon: Jeremy Gosselin, MD;  Location: [facility] ORS;  Service: Orthopedics;  Laterality: N/A;   PACEMAKER  INSERTION     SHOULDER SURGERY Bilateral    SKIN BIOPSY  05/04/2019   BASAL CELL CARCINOMA//MID CENTRAL NASAL   SKIN BIOPSY  10/11/2019   Superficial basal cell carcinoma - left superior central forehead shave    Current Medications: Current Meds  Medication Sig   ACCU-CHEK GUIDE test strip USE AS INSTRUCTED   Accu-Chek Softclix Lancets lancets USE TO CHECK BLOOD SUGAR TWICE A DAY AND AS NEEDED   acetaminophen (TYLENOL) 650 MG CR tablet Take 1,300 mg by mouth 2 (two) times daily.   albuterol (VENTOLIN HFA) 108 (90 Base) MCG/ACT inhaler INHALE 2 PUFFS INTO THE LUNGS EVERY 6 HOURS AS NEEDED FOR WHEEZING OR SHORTNESS OF BREATH.   Ascorbic Acid (VITAMIN C) 1000 MG tablet Take 1,000 mg by mouth daily.   atorvastatin (LIPITOR) 80 MG tablet Take 1 tablet (80 mg total) by mouth daily.   Blood Glucose Monitoring Suppl (ACCU-CHEK GUIDE) w/Device KIT Use as directed   calcium carbonate (OS-CAL) 600 MG TABS Take 600 mg by mouth 2 (two) times daily with a meal.   carbidopa-levodopa (SINEMET CR) 50-200 MG tablet Take 1 tablet by mouth at bedtime.   carbidopa-levodopa (SINEMET IR) 25-100 MG tablet Take 1 tablet by mouth 4 (four) times daily.   cetirizine (ZYRTEC) 10 MG tablet Take 10 mg by mouth daily.    cholecalciferol (VITAMIN D) 25 MCG (1000 UNIT) tablet Take 1,000 Units by mouth daily.   clopidogrel (PLAVIX) 75 MG tablet Take 1 tablet (75 mg total) by mouth daily.   fluticasone (FLONASE) 50 MCG/ACT nasal spray Place 2 sprays into both nostrils daily.   furosemide (LASIX) 40 MG tablet TAKE 1 TABLET EVERY DAY   losartan (COZAAR) 25 MG tablet Take 1 tablet (25 mg total) by mouth daily.   melatonin 5 MG TABS Take 5 mg by mouth at bedtime.   metFORMIN (GLUCOPHAGE) 500 MG tablet TAKE 1/2 TABLET EVERY DAY WITH BREAKFAST (SCHEDULE APPT FOR FUTURE REFILLS)   metoprolol succinate (TOPROL-XL) 50 MG 24 hr tablet Take 1 tablet (50 mg total) by mouth daily. Take with or immediately following a meal.   Misc  Natural Products (TART CHERRY ADVANCED) CAPS Take 2 capsules by mouth daily.    Multiple Vitamin (MULTIVITAMIN) tablet Take 1 tablet by mouth daily.     nitroGLYCERIN (NITROSTAT) 0.4 MG SL tablet PLACE 1 TABLET (0.4 MG TOTAL) UNDER THE TONGUE EVERY 5 (FIVE) MINUTES AS NEEDED. FOR CHEST PAIN   potassium chloride (KLOR-CON) 10 MEQ tablet TAKE 1 TABLET EVERY DAY   Probiotic Product (PHILLIPS COLON HEALTH PO) Take 1 capsule by mouth daily.   vitamin B-12 (CYANOCOBALAMIN) 1000 MCG tablet Take 1,000 mcg by mouth daily.   warfarin (COUMADIN) 10 MG tablet TAKE 1/2 TO 1 TABLET DAILY OR AS DIRECTED BY COUMADIN CLINIC   [DISCONTINUED] losartan (COZAAR) 100 MG tablet TAKE 1 TABLET EVERY DAY   [  DISCONTINUED] metoprolol succinate (TOPROL-XL) 100 MG 24 hr tablet TAKE 1 TABLET EVERY DAY     Allergies:   Bee venom, Codeine phosphate, Metoprolol tartrate, Pramipexole, Tramadol, and Ramipril   Social History   Socioeconomic History   Marital status: Married    Spouse name: Not on file   Number of children: 3   Years of education: Not on file   Highest education level: Not on file  Occupational History   Occupation: retired  Tobacco Use   Smoking status: Former    Types: Cigarettes    Quit date: 07/12/1973    Years since quitting: 49.3   Smokeless tobacco: Never  Vaping Use   Vaping Use: Never used  Substance and Sexual Activity   Alcohol use: No   Drug use: No   Sexual activity: Not on file  Other Topics Concern   Not on file  Social History Narrative   The patient is married and retired   He reports 3 grown children   Former smoker, no alcohol or tobacco or drug use at this time   Social Determinants of Corporate investment banker Strain: Low Risk  (06/17/2022)   Overall Financial Resource Strain (CARDIA)    Difficulty of Paying Living Expenses: Not hard at all  Food Insecurity: No Food Insecurity (06/17/2022)   Hunger Vital Sign    Worried About Running Out of Food in the Last Year: Never  true    Ran Out of Food in the Last Year: Never true  Transportation Needs: No Transportation Needs (06/17/2022)   PRAPARE - Administrator, Civil Service (Medical): No    Lack of Transportation (Non-Medical): No  Physical Activity: Inactive (06/17/2022)   Exercise Vital Sign    Days of Exercise per Week: 0 days    Minutes of Exercise per Session: 0 min  Stress: No Stress Concern Present (06/17/2022)   [employer] of Occupational Health - Occupational Stress Questionnaire    Feeling of Stress : Not at all  Social Connections: Socially Integrated (06/17/2022)   Social Connection and Isolation Panel [NHANES]    Frequency of Communication with Friends and Family: More than three times a week    Frequency of Social Gatherings with Friends and Family: More than three times a week    Attends Religious Services: More than 4 times per year    Active Member of [employer] or Organizations: Yes    Attends Engineer, structural: More than 4 times per year    Marital Status: Married     Family History: The patient's family history includes Diabetes in his brother, brother, mother, and sister; Epilepsy in his sister; Heart attack in his father; Hypertension in his mother; Kidney cancer in his father. There is no history of Colon cancer, Rectal cancer, Stomach cancer, or Esophageal cancer.  ROS:   Please see the history of present illness.     All other systems reviewed and are negative.  EKGs/Labs/Other Studies Reviewed:    The following studies were reviewed today: ECHO 04/13/21:   1. Distal septal hypokinesis Inferior wall hypokinesis . Left ventricular  ejection fraction, by estimation, is 45 to 50%. The left ventricle has  mildly decreased function. The left ventricle demonstrates regional wall  motion abnormalities (see scoring  diagram/findings for description). The left ventricular internal cavity  size was mildly dilated. There is mild left ventricular hypertrophy.  Left  ventricular diastolic parameters are consistent with Grade I diastolic  dysfunction (impaired relaxation).  2. Pacing wires in RA/RV. Right ventricular systolic function is normal.  The right ventricular size is normal. There is normal pulmonary artery  systolic pressure.   3. The mitral valve is abnormal. Mild mitral valve regurgitation. No  evidence of mitral stenosis.   4. Appears to be both central and PVL Post mechanical AVR replacement ST  Jude with Bental stable low gradients in systole . The aortic valve has  been repaired/replaced. Aortic valve regurgitation is mild. No aortic  stenosis is present. Procedure Date:  02/2004.   5. The inferior vena cava is normal in size with greater than 50%  respiratory variability, suggesting right atrial pressure of 3 mmHg.   05/12/20: Thoracic aortic aneurysm with maximal dimension of 4.5 cm  EKG:  EKG is  ordered today.  The ekg ordered today demonstrates SR 1st degree AVB, LBBB  Recent Labs: 05/25/2022: Magnesium 2.0 06/17/2022: ALT 12; BUN 20; Creatinine, Whitney 0.92; Hemoglobin 13.5; Platelets 238.0; Potassium 3.7; Sodium 140; TSH 1.04  Recent Lipid Panel    Component Value Date/Time   CHOL 132 06/10/2022 1051   TRIG 128 06/10/2022 1051   TRIG 91 06/14/2006 0825   HDL 46 06/10/2022 1051   CHOLHDL 2.9 06/10/2022 1051   CHOLHDL 3.1 05/26/2022 0835   VLDL 22 05/26/2022 0835   LDLCALC 63 06/10/2022 1051     Risk Assessment/Calculations:          Physical Exam:    VS:  BP 101/62 (BP Location: Left Arm, Patient Position: Sitting, Cuff Size: Normal)   Pulse 71   Ht 5' 6.5" (1.689 Whitney)   Wt 190 lb (86.2 kg)   BMI 30.21 kg/Whitney     Wt Readings from Last 3 Encounters:  11/19/22 190 lb (86.2 kg)  07/20/22 206 lb (93.4 kg)  06/17/22 200 lb (90.7 kg)     GEN:  Well nourished, well developed in no acute distress HEENT: Normal NECK: No JVD; No carotid bruits LYMPHATICS: No lymphadenopathy CARDIAC: RRR, Sharp S2, no murmurs,  rubs, gallops RESPIRATORY:  Clear to auscultation without rales, wheezing or rhonchi  ABDOMEN: Soft, non-tender, non-distended MUSCULOSKELETAL:  No edema; No deformity  SKIN: Warm and dry NEUROLOGIC:  Alert and oriented x 3, Parkinson tremor noted PSYCHIATRIC:  Normal affect   ASSESSMENT:    1. Coronary artery disease involving native coronary artery of native heart with angina pectoris (HCC)   2. Hx of aortic valve replacement, mechanical   3. Hyperlipidemia LDL goal <70   4. Aneurysm of descending thoracic aorta without rupture (HCC)   5. Parkinson's disease with dyskinesia without fluctuating manifestations     PLAN:    In order of problems listed above:  Coronary artery disease involving native coronary artery of native heart with angina pectoris (HCC) History of CABG in 2005 and non-STEMI in November 2023 treated with a DES to the mid left main to ostial LAD and DES to the proximal LCx to the OM2.  He has had chronic chest symptoms since his bypass and valve surgery in 2005.  He had some recurrent symptoms after his recent PCI.  Continue Lipitor 80 mg daily, Plavix 75 mg daily, prn Nitroglycerin.     Aortic stenosis S/p mechanical AVR in 2005.  AVR functioning normally on echocardiogram in November 2023.  Continue SBE prophylaxis.  Continue Coumadin with monitoring by our Coumadin clinic. No changes.    Essential hypertension Blood pressure has been fairly soft recently.  This can sometimes be seen with progression of Parkinson's.  We will pull back on his medications.  Losartan 25 down from 100 and Toprol 50 down from 100.  Weight is also down about 15 pounds.  Parkinson's disease On Sinemet, tremor noted on exam.   Hyperlipidemia LDL goal <70 LDL optimal on most recent lab work.  Continue current Rx with with Lipitor 80 mg daily.LDL 63.   s/p Pacemaker F/u with EP as planned.    Thoracic aortic aneurysm (HCC) Hx of Benatall with aortic root replacement in 2005. CT in  04/2022 with descending aorta 4.5 cm. This is followed with serial CT scans. Stable.        Medication Adjustments/Labs and Tests Ordered: Current medicines are reviewed at length with the patient today.  Concerns regarding medicines are outlined above.  No orders of the defined types were placed in this encounter.  Meds ordered this encounter  Medications   losartan (COZAAR) 25 MG tablet    Sig: Take 1 tablet (25 mg total) by mouth daily.    Dispense:  90 tablet    Refill:  3    PLS d/c any other RX on file for Losartan - this is a dose decrease   metoprolol succinate (TOPROL-XL) 50 MG 24 hr tablet    Sig: Take 1 tablet (50 mg total) by mouth daily. Take with or immediately following a meal.    Dispense:  90 tablet    Refill:  3    Pls d/c any other rx on file for Metoprolol.  This is a dose decrease     Patient Instructions  Medication Instructions:  Please decrease Losartan to 25 mg a day and Metoprolol to 50 mg a day. Continue all other medications as listed.  *If you need a refill on your cardiac medications before your next appointment, please call your pharmacy*   Follow-Up: At [facility], you and your health needs are our priority.  As part of our continuing mission to provide you with exceptional heart care, we have created designated Provider Care Teams.  These Care Teams include your primary Cardiologist (physician) and Advanced Practice Providers (APPs -  Physician Assistants and Nurse Practitioners) who all work together to provide you with the care you need, when you need it.  We recommend signing up for the patient portal called "MyChart".  Sign up information is provided on this After Visit Summary.  MyChart is used to connect with patients for Virtual Visits (Telemedicine).  Patients are able to view lab/test results, encounter notes, upcoming appointments, etc.  Non-urgent messages can be sent to your provider as well.   To learn more about what you can  do with MyChart, go to [email or web address].    Your next appointment:   1 year(s)  Provider:   Donato Schultz, MD        Signed, Jeremy Schultz, MD  11/19/2022 12:29 PM    [facility]

## 2022-11-29 DIAGNOSIS — L570 Actinic keratosis: Secondary | ICD-10-CM | POA: Diagnosis not present

## 2022-11-29 DIAGNOSIS — C44311 Basal cell carcinoma of skin of nose: Secondary | ICD-10-CM | POA: Diagnosis not present

## 2022-11-29 DIAGNOSIS — L814 Other melanin hyperpigmentation: Secondary | ICD-10-CM | POA: Diagnosis not present

## 2022-11-29 DIAGNOSIS — D225 Melanocytic nevi of trunk: Secondary | ICD-10-CM | POA: Diagnosis not present

## 2022-11-29 DIAGNOSIS — Z85828 Personal history of other malignant neoplasm of skin: Secondary | ICD-10-CM | POA: Diagnosis not present

## 2022-11-29 DIAGNOSIS — C44319 Basal cell carcinoma of skin of other parts of face: Secondary | ICD-10-CM | POA: Diagnosis not present

## 2022-11-29 DIAGNOSIS — L821 Other seborrheic keratosis: Secondary | ICD-10-CM | POA: Diagnosis not present

## 2022-11-30 NOTE — Progress Notes (Signed)
Remote pacemaker transmission.   

## 2022-12-11 ENCOUNTER — Other Ambulatory Visit: Payer: Self-pay | Admitting: Adult Health

## 2022-12-21 ENCOUNTER — Ambulatory Visit: Payer: Medicare HMO | Attending: Cardiology

## 2022-12-21 DIAGNOSIS — Z952 Presence of prosthetic heart valve: Secondary | ICD-10-CM | POA: Diagnosis not present

## 2022-12-21 DIAGNOSIS — Z09 Encounter for follow-up examination after completed treatment for conditions other than malignant neoplasm: Secondary | ICD-10-CM

## 2022-12-21 DIAGNOSIS — Z5181 Encounter for therapeutic drug level monitoring: Secondary | ICD-10-CM

## 2022-12-21 LAB — POCT INR: INR: 2.6 (ref 2.0–3.0)

## 2022-12-21 NOTE — Patient Instructions (Signed)
continue taking warfarin 1 tablet daily except for 1/2 tablet on Sundays, Tuesdays, and Thursdays. Remain consistent with green leafy veggies. Recheck INR in 6 weeks.  Coumadin Clinic [phone] or [phone]

## 2022-12-31 ENCOUNTER — Inpatient Hospital Stay (HOSPITAL_BASED_OUTPATIENT_CLINIC_OR_DEPARTMENT_OTHER)
Admission: EM | Admit: 2022-12-31 | Discharge: 2023-01-06 | DRG: 321 | Disposition: A | Discharge status: Home / Self Care | Payer: Medicare HMO | Attending: Internal Medicine | Admitting: Internal Medicine

## 2022-12-31 ENCOUNTER — Other Ambulatory Visit: Payer: Self-pay

## 2022-12-31 ENCOUNTER — Telehealth: Payer: Self-pay | Admitting: Cardiology

## 2022-12-31 ENCOUNTER — Emergency Department (HOSPITAL_BASED_OUTPATIENT_CLINIC_OR_DEPARTMENT_OTHER): Payer: Medicare HMO

## 2022-12-31 ENCOUNTER — Encounter (HOSPITAL_BASED_OUTPATIENT_CLINIC_OR_DEPARTMENT_OTHER): Payer: Self-pay | Admitting: Emergency Medicine

## 2022-12-31 DIAGNOSIS — I25810 Atherosclerosis of coronary artery bypass graft(s) without angina pectoris: Secondary | ICD-10-CM | POA: Diagnosis present

## 2022-12-31 DIAGNOSIS — J45909 Unspecified asthma, uncomplicated: Secondary | ICD-10-CM | POA: Diagnosis present

## 2022-12-31 DIAGNOSIS — I442 Atrioventricular block, complete: Secondary | ICD-10-CM | POA: Diagnosis present

## 2022-12-31 DIAGNOSIS — Z86010 Personal history of colonic polyps: Secondary | ICD-10-CM

## 2022-12-31 DIAGNOSIS — I340 Nonrheumatic mitral (valve) insufficiency: Secondary | ICD-10-CM | POA: Diagnosis not present

## 2022-12-31 DIAGNOSIS — Z7901 Long term (current) use of anticoagulants: Secondary | ICD-10-CM | POA: Diagnosis not present

## 2022-12-31 DIAGNOSIS — T82855A Stenosis of coronary artery stent, initial encounter: Principal | ICD-10-CM | POA: Diagnosis present

## 2022-12-31 DIAGNOSIS — Z888 Allergy status to other drugs, medicaments and biological substances status: Secondary | ICD-10-CM | POA: Diagnosis not present

## 2022-12-31 DIAGNOSIS — Z952 Presence of prosthetic heart valve: Secondary | ICD-10-CM | POA: Diagnosis not present

## 2022-12-31 DIAGNOSIS — I25710 Atherosclerosis of autologous vein coronary artery bypass graft(s) with unstable angina pectoris: Secondary | ICD-10-CM | POA: Diagnosis present

## 2022-12-31 DIAGNOSIS — I5033 Acute on chronic diastolic (congestive) heart failure: Secondary | ICD-10-CM | POA: Diagnosis not present

## 2022-12-31 DIAGNOSIS — Z85828 Personal history of other malignant neoplasm of skin: Secondary | ICD-10-CM | POA: Diagnosis not present

## 2022-12-31 DIAGNOSIS — Z950 Presence of cardiac pacemaker: Secondary | ICD-10-CM | POA: Diagnosis not present

## 2022-12-31 DIAGNOSIS — Z7902 Long term (current) use of antithrombotics/antiplatelets: Secondary | ICD-10-CM | POA: Diagnosis not present

## 2022-12-31 DIAGNOSIS — Y831 Surgical operation with implant of artificial internal device as the cause of abnormal reaction of the patient, or of later complication, without mention of misadventure at the time of the procedure: Secondary | ICD-10-CM | POA: Diagnosis present

## 2022-12-31 DIAGNOSIS — I200 Unstable angina: Secondary | ICD-10-CM | POA: Diagnosis present

## 2022-12-31 DIAGNOSIS — I25110 Atherosclerotic heart disease of native coronary artery with unstable angina pectoris: Secondary | ICD-10-CM | POA: Diagnosis present

## 2022-12-31 DIAGNOSIS — I6523 Occlusion and stenosis of bilateral carotid arteries: Secondary | ICD-10-CM | POA: Diagnosis present

## 2022-12-31 DIAGNOSIS — E785 Hyperlipidemia, unspecified: Secondary | ICD-10-CM | POA: Diagnosis present

## 2022-12-31 DIAGNOSIS — Z7984 Long term (current) use of oral hypoglycemic drugs: Secondary | ICD-10-CM | POA: Diagnosis not present

## 2022-12-31 DIAGNOSIS — Z79899 Other long term (current) drug therapy: Secondary | ICD-10-CM | POA: Diagnosis not present

## 2022-12-31 DIAGNOSIS — Z955 Presence of coronary angioplasty implant and graft: Secondary | ICD-10-CM

## 2022-12-31 DIAGNOSIS — I252 Old myocardial infarction: Secondary | ICD-10-CM

## 2022-12-31 DIAGNOSIS — E119 Type 2 diabetes mellitus without complications: Secondary | ICD-10-CM | POA: Diagnosis not present

## 2022-12-31 DIAGNOSIS — Z87891 Personal history of nicotine dependence: Secondary | ICD-10-CM

## 2022-12-31 DIAGNOSIS — I110 Hypertensive heart disease with heart failure: Secondary | ICD-10-CM | POA: Diagnosis present

## 2022-12-31 DIAGNOSIS — I7121 Aneurysm of the ascending aorta, without rupture: Secondary | ICD-10-CM | POA: Diagnosis present

## 2022-12-31 DIAGNOSIS — Z820 Family history of epilepsy and other diseases of the nervous system: Secondary | ICD-10-CM

## 2022-12-31 DIAGNOSIS — M109 Gout, unspecified: Secondary | ICD-10-CM | POA: Diagnosis present

## 2022-12-31 DIAGNOSIS — Z885 Allergy status to narcotic agent status: Secondary | ICD-10-CM

## 2022-12-31 DIAGNOSIS — I10 Essential (primary) hypertension: Secondary | ICD-10-CM | POA: Diagnosis present

## 2022-12-31 DIAGNOSIS — D649 Anemia, unspecified: Secondary | ICD-10-CM | POA: Diagnosis present

## 2022-12-31 DIAGNOSIS — E1151 Type 2 diabetes mellitus with diabetic peripheral angiopathy without gangrene: Secondary | ICD-10-CM | POA: Diagnosis present

## 2022-12-31 DIAGNOSIS — H11431 Conjunctival hyperemia, right eye: Secondary | ICD-10-CM | POA: Diagnosis present

## 2022-12-31 DIAGNOSIS — R0602 Shortness of breath: Secondary | ICD-10-CM | POA: Diagnosis not present

## 2022-12-31 DIAGNOSIS — G20A1 Parkinson's disease without dyskinesia, without mention of fluctuations: Secondary | ICD-10-CM | POA: Diagnosis present

## 2022-12-31 DIAGNOSIS — R079 Chest pain, unspecified: Secondary | ICD-10-CM | POA: Diagnosis present

## 2022-12-31 DIAGNOSIS — K219 Gastro-esophageal reflux disease without esophagitis: Secondary | ICD-10-CM | POA: Diagnosis present

## 2022-12-31 DIAGNOSIS — Z8249 Family history of ischemic heart disease and other diseases of the circulatory system: Secondary | ICD-10-CM

## 2022-12-31 DIAGNOSIS — Z833 Family history of diabetes mellitus: Secondary | ICD-10-CM

## 2022-12-31 DIAGNOSIS — I440 Atrioventricular block, first degree: Secondary | ICD-10-CM | POA: Diagnosis present

## 2022-12-31 DIAGNOSIS — I2582 Chronic total occlusion of coronary artery: Secondary | ICD-10-CM | POA: Diagnosis present

## 2022-12-31 DIAGNOSIS — I2510 Atherosclerotic heart disease of native coronary artery without angina pectoris: Secondary | ICD-10-CM | POA: Diagnosis not present

## 2022-12-31 DIAGNOSIS — Z8051 Family history of malignant neoplasm of kidney: Secondary | ICD-10-CM

## 2022-12-31 LAB — CBC
HCT: 42 % (ref 39.0–52.0)
Hemoglobin: 13.9 g/dL (ref 13.0–17.0)
MCH: 30.2 pg (ref 26.0–34.0)
MCHC: 33.1 g/dL (ref 30.0–36.0)
MCV: 91.3 fL (ref 80.0–100.0)
Platelets: 224 K/uL (ref 150–400)
RBC: 4.6 MIL/uL (ref 4.22–5.81)
RDW: 15 % (ref 11.5–15.5)
WBC: 8.2 K/uL (ref 4.0–10.5)
nRBC: 0 % (ref 0.0–0.2)

## 2022-12-31 LAB — BASIC METABOLIC PANEL WITH GFR
Anion gap: 6 (ref 5–15)
BUN: 17 mg/dL (ref 8–23)
CO2: 30 mmol/L (ref 22–32)
Calcium: 9.7 mg/dL (ref 8.9–10.3)
Chloride: 102 mmol/L (ref 98–111)
Creatinine, Ser: 0.85 mg/dL (ref 0.61–1.24)
GFR, Estimated: >60 mL/min
Glucose, Bld: 145 mg/dL — ABNORMAL HIGH (ref 70–99)
Potassium: 3.9 mmol/L (ref 3.5–5.1)
Sodium: 138 mmol/L (ref 135–145)

## 2022-12-31 LAB — TROPONIN I (HIGH SENSITIVITY): Troponin I (High Sensitivity): 17 ng/L (ref <18)

## 2022-12-31 LAB — PROTIME-INR
INR: 1.9 — ABNORMAL HIGH (ref 0.8–1.2)
Prothrombin Time: 21.7 s — ABNORMAL HIGH (ref 11.4–15.2)

## 2022-12-31 MED ORDER — NITROGLYCERIN 0.4 MG SL SUBL
0.4000 mg | SUBLINGUAL_TABLET | SUBLINGUAL | Status: DC | PRN
  Administered 2022-12-31 – 2023-01-03 (×12): 0.4 mg via SUBLINGUAL
  Filled 2022-12-31 (×10): qty 1

## 2022-12-31 MED ORDER — HEPARIN (PORCINE) 25000 UT/250ML-% IV SOLN
1100.0000 [IU]/h | INTRAVENOUS | Status: DC
  Administered 2022-12-31: 900 [IU]/h via INTRAVENOUS
  Administered 2023-01-01 – 2023-01-02 (×2): 1100 [IU]/h via INTRAVENOUS
  Filled 2022-12-31 (×3): qty 250

## 2022-12-31 MED ORDER — ASPIRIN 81 MG PO CHEW
324.0000 mg | CHEWABLE_TABLET | Freq: Once | ORAL | Status: AC
  Administered 2022-12-31: 324 mg via ORAL
  Filled 2022-12-31: qty 4

## 2022-12-31 NOTE — ED Notes (Addendum)
Patient complains of 8/10 chest pain, Dr. Charm Barges notified, please see [facility]

## 2022-12-31 NOTE — ED Triage Notes (Addendum)
Pt presents to ED PVO. Pt c/o CP intermittent all week. Pt reports that today he had to take 2 nitros today which is unusual for home. Pt reports no CP since second nitroglycerin today  Pt has extensive cardiac hx. Taking Plavix and coumadin. St. Jude Visual merchandiser.

## 2022-12-31 NOTE — Progress Notes (Signed)
   Patient Name: [name], [name] DOB: [birth year] MRN: [id number] Transferring facility: [facility] Requesting provider: butler, md Reason for transfer: CP 77 yo WM with hx of CAD s/p CABG, s/p PPM, s/p TAVR, with chest pain. took several NTGs. CP improved. EDP discussed case with cardiology fellow who recommended starting IV heparin. pt has PCI/atherectomy in 05/2022 of Left main, prox Left Cx, OM. Going to: [facility] Admission Status:  OBS Bed Type: card/tele To Do: cardiology fellow to consult on patient arrival to [facility]  [facility] will assume care on arrival to accepting facility. Until arrival, medical  decision making responsibilities remain with the EDP.  However, [facility] available 24/7 for questions and assistance.   Nursing staff please page [facility] Admits and Consults [phone]) as soon as the patient arrives to the hospital.  Carollee Herter, DO [facility]

## 2022-12-31 NOTE — ED Notes (Signed)
Patient states he doesn't need another nitroglycerin tablet at  this time.

## 2022-12-31 NOTE — Progress Notes (Signed)
   Patient Name: [name], [name] DOB: 06/15/1946 MRN: [id number] Transferring facility: [facility] Requesting provider: butler, md Reason for transfer: CP 76 yo WM with hx of CAD s/p CABG, s/p PPM, s/p TAVR, with chest pain. took several NTGs. CP improved. EDP discussed case with cardiology fellow who recommended starting IV heparin. pt has PCI/atherectomy in 05/2022 of Left main, prox Left Cx, OM. Going to: [facility] Admission Status:  OBS Bed Type: card/tele To Do: cardiology fellow to consult on patient arrival to [facility]  [facility] will assume care on arrival to accepting facility. Until arrival, medical  decision making responsibilities remain with the EDP.  However, [facility] available 24/7 for questions and assistance.   Nursing staff please page [facility] Admits and Consults ([phone]) as soon as the patient arrives to the hospital.  Jeremy Wichert, DO [facility]  

## 2022-12-31 NOTE — ED Notes (Signed)
Report given to  Jacklyn Shell, RN

## 2022-12-31 NOTE — ED Provider Notes (Signed)
[place] EMERGENCY DEPARTMENT AT [facility] Provider Note   CSN: [id number] Arrival date & time: 12/31/22  2126     History {Add pertinent medical, surgical, social history, OB history to HPI:1} Chief Complaint  Patient presents with   Chest Pain    [name] is a 77 y.o. male.  He has a history of coronary disease with stent, aortic valve replacement on warfarin, diabetes vascular disease.  He is here with increasing episodes of chest pain over the last few weeks.  He typically takes 3 nitro a month.  HPI     Home Medications Prior to Admission medications   Medication Sig Start Date End Date Taking? Authorizing Provider  ACCU-CHEK GUIDE test strip USE AS INSTRUCTED 06/22/22   [name], NP  Accu-Chek Softclix Lancets lancets USE TO CHECK BLOOD SUGAR TWICE A DAY AND AS NEEDED 06/22/22   Nafziger, Kandee Keen, NP  acetaminophen (TYLENOL) 650 MG CR tablet Take 1,300 mg by mouth 2 (two) times daily.    [provider]  albuterol (VENTOLIN HFA) 108 (90 Base) MCG/ACT inhaler INHALE 2 PUFFS INTO THE LUNGS EVERY 6 HOURS AS NEEDED FOR WHEEZING OR SHORTNESS OF BREATH. 12/14/22   Nafziger, Kandee Keen, NP  Ascorbic Acid (VITAMIN C) 1000 MG tablet Take 1,000 mg by mouth daily.    [provider]  atorvastatin (LIPITOR) 80 MG tablet Take 1 tablet (80 mg total) by mouth daily. 09/21/22   Jake Bathe, MD  Blood Glucose Monitoring Suppl (ACCU-CHEK GUIDE) w/Device KIT Use as directed 06/18/21   [name], NP  calcium carbonate (OS-CAL) 600 MG TABS Take 600 mg by mouth 2 (two) times daily with a meal.    [provider]  carbidopa-levodopa (SINEMET CR) 50-200 MG tablet Take 1 tablet by mouth at bedtime.    [provider]  carbidopa-levodopa (SINEMET IR) 25-100 MG tablet Take 1 tablet by mouth 4 (four) times daily.    [provider]  cetirizine (ZYRTEC) 10 MG tablet Take 10 mg by mouth daily.     [provider]  cholecalciferol  (VITAMIN D) 25 MCG (1000 UNIT) tablet Take 1,000 Units by mouth daily.    [provider]  clopidogrel (PLAVIX) 75 MG tablet Take 1 tablet (75 mg total) by mouth daily. 09/21/22   Jake Bathe, MD  fluticasone (FLONASE) 50 MCG/ACT nasal spray Place 2 sprays into both nostrils daily. 12/28/18   Nafziger, Kandee Keen, NP  furosemide (LASIX) 40 MG tablet TAKE 1 TABLET EVERY DAY 08/10/22   Nafziger, Kandee Keen, NP  losartan (COZAAR) 25 MG tablet Take 1 tablet (25 mg total) by mouth daily. 11/19/22   Jake Bathe, MD  melatonin 5 MG TABS Take 5 mg by mouth at bedtime.    [provider]  metFORMIN (GLUCOPHAGE) 500 MG tablet TAKE 1/2 TABLET EVERY DAY WITH BREAKFAST (SCHEDULE APPT FOR FUTURE REFILLS) 10/25/22   [name], NP  metoprolol succinate (TOPROL-XL) 50 MG 24 hr tablet Take 1 tablet (50 mg total) by mouth daily. Take with or immediately following a meal. 11/19/22   Jake Bathe, MD  Misc Natural Products (TART CHERRY ADVANCED) CAPS Take 2 capsules by mouth daily.     [provider]  Multiple Vitamin (MULTIVITAMIN) tablet Take 1 tablet by mouth daily.      [provider]  nitroGLYCERIN (NITROSTAT) 0.4 MG SL tablet PLACE 1 TABLET (0.4 MG TOTAL) UNDER THE TONGUE EVERY 5 (FIVE) MINUTES AS NEEDED. FOR CHEST PAIN 09/30/22  Jake Bathe, MD  potassium chloride (KLOR-CON) 10 MEQ tablet TAKE 1 TABLET EVERY DAY 08/10/22   Nafziger, Kandee Keen, NP  Probiotic Product (PHILLIPS COLON HEALTH PO) Take 1 capsule by mouth daily.    [provider]  vitamin B-12 (CYANOCOBALAMIN) 1000 MCG tablet Take 1,000 mcg by mouth daily.    [provider]  warfarin (COUMADIN) 10 MG tablet TAKE 1/2 TO 1 TABLET DAILY OR AS DIRECTED BY COUMADIN CLINIC 08/09/22   Jake Bathe, MD      Allergies    Bee venom, Codeine phosphate, Metoprolol tartrate, Pramipexole, Tramadol, and Ramipril    Review of Systems   Review of Systems  Physical Exam Updated Vital Signs BP (!) 142/78   Pulse  75   Temp 98.3 F (36.8 C) (Oral)   Resp 16   SpO2 96%  Physical Exam  ED Results / Procedures / Treatments   Labs (all labs ordered are listed, but only abnormal results are displayed) Labs Reviewed  BASIC METABOLIC PANEL - Abnormal; Notable for the following components:      Result Value   Glucose, Bld 145 (*)    All other components within normal limits  PROTIME-INR - Abnormal; Notable for the following components:   Prothrombin Time 21.7 (*)    INR 1.9 (*)    All other components within normal limits  CBC  TROPONIN I (HIGH SENSITIVITY)    EKG EKG Interpretation  Date/Time:  Friday December 31 2022 21:36:36 EDT Ventricular Rate:  84 PR Interval:  230 QRS Duration: 186 QT Interval:  436 QTC Calculation: 515 R Axis:   -88 Text Interpretation: Atrial-sensed ventricular-paced rhythm with prolonged AV conduction Abnormal ECG When compared with ECG of 29-May-2022 05:18, Vent. rate has increased BY  13 BPM Confirmed by Meridee Score [phone]) on 12/31/2022 9:37:46 PM  Radiology No results found.  Procedures Procedures  {Document cardiac monitor, telemetry assessment procedure when appropriate:1}  Medications Ordered in ED Medications - No data to display  ED Course/ Medical Decision Making/ A&P   {   Click here for ABCD2, HEART and other calculatorsREFRESH Note before signing :1}                          Medical Decision Making Amount and/or Complexity of Data Reviewed Labs: ordered. Radiology: ordered.   ***  {Document critical care time when appropriate:1} {Document review of labs and clinical decision tools ie heart score, Chads2Vasc2 etc:1}  {Document your independent review of radiology images, and any outside records:1} {Document your discussion with family members, caretakers, and with consultants:1} {Document social determinants of health affecting pt's care:1} {Document your decision making why or why not admission, treatments were needed:1} Final  Clinical Impression(s) / ED Diagnoses Final diagnoses:  None    Rx / DC Orders ED Discharge Orders     None

## 2022-12-31 NOTE — Telephone Encounter (Signed)
Patient's wife called in reporting patient having chest pain.  She reports he has been having some chronic episodes of chest pain for quite some time.  Mostly when he wakes up in the morning will take a sublingual nitroglycerin with resolution of symptoms.  This was discussed at his last office visit back last month.  States today they were out at the store.  He was walking and developed centralized chest pain, commented to her that he did not feel well. This is unlike him. Did take a sublingual nitroglycerin with essentially resolution in symptoms.  I discussed with her given the change in his symptoms with now exertional angina would be best that they be evaluated in the ED given his extensive cardiac history.  She voiced understanding and thanked me for call back.

## 2022-12-31 NOTE — Progress Notes (Signed)
ANTICOAGULATION CONSULT NOTE - Initial Consult  Pharmacy Consult for heparin  Indication: chest pain/ACS  Allergies  Allergen Reactions   Bee Venom Shortness Of Breath and Swelling   Codeine Phosphate Hives, Shortness Of Breath, Itching, Rash and Other (See Comments)    Tolerated multiple doses of IV morphine   Metoprolol Tartrate Other (See Comments)    loss of vision. Can take the XL tablet not regular    Pramipexole Other (See Comments)    Hallucinations     Tramadol     Pt has hallucinations    Ramipril Rash and Cough    Patient Measurements: Weight: 86.2 kg (190 lb 0.6 oz) Heparin Dosing Weight: 82.7kg   Vital Signs: Temp: 98.3 F (36.8 C) (06/21 2137) Temp Source: Oral (06/21 2137) BP: 142/78 (06/21 2211) Pulse Rate: 75 (06/21 2211)  Labs: Recent Labs    12/31/22 2141  HGB 13.9  HCT 42.0  PLT 224  LABPROT 21.7*  INR 1.9*  CREATININE 0.85  TROPONINIHS 17    Estimated Creatinine Clearance: 74.5 mL/min (by C-G formula based on SCr of 0.85 mg/dL).   Medical History: Past Medical History:  Diagnosis Date   Acute renal failure (HCC) 02/09/2013   Aortic stenosis 08/25/2010   a. [place] aortic root replacement with a St. Jude mechanical valve and Hemashield conduit 02/2004.   Arthritis    Asthma    AV BLOCK, COMPLETE    a. s/p St Jude dual chamber pacemaker 02/2004.   CAD (coronary artery disease)    a. s/p CABGx2 (LIMA-dLAD, SVG-Cx). b. Low risk nuc 12/2012 without ischemia, EF 46% mild apical hypokinesia (EF 55% inf HK by echo).   Carotid artery disease (HCC)    a. 0-39% bilateral ICA stenosis, stable mild hard plaque in carotid bulbs. F/u 03/2014 recommended.   Diabetes mellitus without complication (HCC)    type 2    DIVERTICULOSIS, COLON 10/17/2007   Ejection fraction    a. EF 55% with inf HK, mild MR by echo 12/2012.   GERD (gastroesophageal reflux disease)    hxof    GOUT 01/16/2007   Headache    hx of migraines    HEMORRHOIDS, INTERNAL 11/01/2008   Hx  of adenomatous polyp of colon 02/2019   no recall needed   HYPERLIPIDEMIA 01/16/2007   HYPERTENSION 01/16/2007   Impaired glucose tolerance    LEG EDEMA 11/27/2008   Special screening for malignant neoplasm of prostate    Warfarin anticoagulation    AVR    Assessment: Patient admitted with CC of chest pain. Patent with extensive cardiac history on warfarin for mechanical aortic valve. Last dose today 20:15. INR today subtherapeutic at 1.9.   Goal of Therapy:  INR goal 2-3 per outpatient notes  Heparin level 0.3-0.7 units/ml Monitor platelets by anticoagulation protocol: Yes   Plan:  No bolus with patient recent full systemic anticoagulation.  Start heparin infusion at 900 units/hr Check anti-Xa level in 8 hours and daily while on heparin Continue to monitor H&H and platelets  Estill Batten, PharmD, BCCCP  12/31/2022,10:38 PM

## 2023-01-01 ENCOUNTER — Observation Stay (HOSPITAL_BASED_OUTPATIENT_CLINIC_OR_DEPARTMENT_OTHER): Payer: Medicare HMO

## 2023-01-01 ENCOUNTER — Encounter (HOSPITAL_COMMUNITY): Payer: Self-pay | Admitting: Internal Medicine

## 2023-01-01 DIAGNOSIS — Z885 Allergy status to narcotic agent status: Secondary | ICD-10-CM | POA: Diagnosis not present

## 2023-01-01 DIAGNOSIS — Z950 Presence of cardiac pacemaker: Secondary | ICD-10-CM | POA: Diagnosis not present

## 2023-01-01 DIAGNOSIS — I110 Hypertensive heart disease with heart failure: Secondary | ICD-10-CM | POA: Diagnosis present

## 2023-01-01 DIAGNOSIS — Z888 Allergy status to other drugs, medicaments and biological substances status: Secondary | ICD-10-CM | POA: Diagnosis not present

## 2023-01-01 DIAGNOSIS — E119 Type 2 diabetes mellitus without complications: Secondary | ICD-10-CM

## 2023-01-01 DIAGNOSIS — I7121 Aneurysm of the ascending aorta, without rupture: Secondary | ICD-10-CM | POA: Diagnosis present

## 2023-01-01 DIAGNOSIS — R0602 Shortness of breath: Secondary | ICD-10-CM | POA: Diagnosis not present

## 2023-01-01 DIAGNOSIS — I2510 Atherosclerotic heart disease of native coronary artery without angina pectoris: Secondary | ICD-10-CM | POA: Diagnosis not present

## 2023-01-01 DIAGNOSIS — I25110 Atherosclerotic heart disease of native coronary artery with unstable angina pectoris: Secondary | ICD-10-CM | POA: Diagnosis present

## 2023-01-01 DIAGNOSIS — E1151 Type 2 diabetes mellitus with diabetic peripheral angiopathy without gangrene: Secondary | ICD-10-CM | POA: Diagnosis present

## 2023-01-01 DIAGNOSIS — G20A1 Parkinson's disease without dyskinesia, without mention of fluctuations: Secondary | ICD-10-CM | POA: Diagnosis present

## 2023-01-01 DIAGNOSIS — E785 Hyperlipidemia, unspecified: Secondary | ICD-10-CM | POA: Diagnosis present

## 2023-01-01 DIAGNOSIS — I340 Nonrheumatic mitral (valve) insufficiency: Secondary | ICD-10-CM

## 2023-01-01 DIAGNOSIS — Z7901 Long term (current) use of anticoagulants: Secondary | ICD-10-CM | POA: Diagnosis not present

## 2023-01-01 DIAGNOSIS — J45909 Unspecified asthma, uncomplicated: Secondary | ICD-10-CM | POA: Diagnosis present

## 2023-01-01 DIAGNOSIS — I2582 Chronic total occlusion of coronary artery: Secondary | ICD-10-CM | POA: Diagnosis present

## 2023-01-01 DIAGNOSIS — Z85828 Personal history of other malignant neoplasm of skin: Secondary | ICD-10-CM | POA: Diagnosis not present

## 2023-01-01 DIAGNOSIS — T82855A Stenosis of coronary artery stent, initial encounter: Secondary | ICD-10-CM | POA: Diagnosis present

## 2023-01-01 DIAGNOSIS — Z8249 Family history of ischemic heart disease and other diseases of the circulatory system: Secondary | ICD-10-CM | POA: Diagnosis not present

## 2023-01-01 DIAGNOSIS — I442 Atrioventricular block, complete: Secondary | ICD-10-CM | POA: Diagnosis present

## 2023-01-01 DIAGNOSIS — I200 Unstable angina: Secondary | ICD-10-CM | POA: Diagnosis not present

## 2023-01-01 DIAGNOSIS — Z7902 Long term (current) use of antithrombotics/antiplatelets: Secondary | ICD-10-CM | POA: Diagnosis not present

## 2023-01-01 DIAGNOSIS — Y831 Surgical operation with implant of artificial internal device as the cause of abnormal reaction of the patient, or of later complication, without mention of misadventure at the time of the procedure: Secondary | ICD-10-CM | POA: Diagnosis present

## 2023-01-01 DIAGNOSIS — Z87891 Personal history of nicotine dependence: Secondary | ICD-10-CM | POA: Diagnosis not present

## 2023-01-01 DIAGNOSIS — I10 Essential (primary) hypertension: Secondary | ICD-10-CM

## 2023-01-01 DIAGNOSIS — Z79899 Other long term (current) drug therapy: Secondary | ICD-10-CM | POA: Diagnosis not present

## 2023-01-01 DIAGNOSIS — D649 Anemia, unspecified: Secondary | ICD-10-CM | POA: Diagnosis present

## 2023-01-01 DIAGNOSIS — Z952 Presence of prosthetic heart valve: Secondary | ICD-10-CM | POA: Diagnosis not present

## 2023-01-01 DIAGNOSIS — I25710 Atherosclerosis of autologous vein coronary artery bypass graft(s) with unstable angina pectoris: Secondary | ICD-10-CM | POA: Diagnosis present

## 2023-01-01 DIAGNOSIS — I5033 Acute on chronic diastolic (congestive) heart failure: Secondary | ICD-10-CM | POA: Diagnosis not present

## 2023-01-01 DIAGNOSIS — Z7984 Long term (current) use of oral hypoglycemic drugs: Secondary | ICD-10-CM | POA: Diagnosis not present

## 2023-01-01 DIAGNOSIS — R079 Chest pain, unspecified: Secondary | ICD-10-CM | POA: Diagnosis present

## 2023-01-01 DIAGNOSIS — I6523 Occlusion and stenosis of bilateral carotid arteries: Secondary | ICD-10-CM | POA: Diagnosis present

## 2023-01-01 LAB — CBC
HCT: 39.5 % (ref 39.0–52.0)
Hemoglobin: 13.1 g/dL (ref 13.0–17.0)
MCH: 30.5 pg (ref 26.0–34.0)
MCHC: 33.2 g/dL (ref 30.0–36.0)
MCV: 92.1 fL (ref 80.0–100.0)
Platelets: 170 10*3/uL (ref 150–400)
RBC: 4.29 MIL/uL (ref 4.22–5.81)
RDW: 15 % (ref 11.5–15.5)
WBC: 7.3 10*3/uL (ref 4.0–10.5)
nRBC: 0 % (ref 0.0–0.2)

## 2023-01-01 LAB — COMPREHENSIVE METABOLIC PANEL
ALT: 22 U/L (ref 0–44)
AST: 33 U/L (ref 15–41)
Albumin: 3.4 g/dL — ABNORMAL LOW (ref 3.5–5.0)
Alkaline Phosphatase: 38 U/L (ref 38–126)
Anion gap: 8 (ref 5–15)
BUN: 15 mg/dL (ref 8–23)
CO2: 26 mmol/L (ref 22–32)
Calcium: 8.7 mg/dL — ABNORMAL LOW (ref 8.9–10.3)
Chloride: 105 mmol/L (ref 98–111)
Creatinine, Ser: 0.74 mg/dL (ref 0.61–1.24)
GFR, Estimated: >60 mL/min (ref >60)
Glucose, Bld: 102 mg/dL — ABNORMAL HIGH (ref 70–99)
Potassium: 4.3 mmol/L (ref 3.5–5.1)
Sodium: 139 mmol/L (ref 135–145)
Total Bilirubin: 1.2 mg/dL (ref 0.3–1.2)
Total Protein: 5.4 g/dL — ABNORMAL LOW (ref 6.5–8.1)

## 2023-01-01 LAB — ECHOCARDIOGRAM COMPLETE
AR max vel: 2.4 cm2
AV Area VTI: 2.54 cm2
AV Area mean vel: 2.33 cm2
AV Mean grad: 5 mmHg
AV Peak grad: 9.2 mmHg
Ao pk vel: 1.52 m/s
Area-P 1/2: 3.34 cm2
Height: 66.5 in
S' Lateral: 3.8 cm
Weight: 2969.6 oz

## 2023-01-01 LAB — HEPARIN LEVEL (UNFRACTIONATED)
Heparin Unfractionated: 0.16 IU/mL — ABNORMAL LOW (ref 0.30–0.70)
Heparin Unfractionated: 0.38 IU/mL (ref 0.30–0.70)

## 2023-01-01 LAB — TROPONIN I (HIGH SENSITIVITY): Troponin I (High Sensitivity): 20 ng/L — ABNORMAL HIGH (ref <18)

## 2023-01-01 LAB — APTT: aPTT: 53 seconds — ABNORMAL HIGH (ref 24–36)

## 2023-01-01 LAB — PROTIME-INR
INR: 2 — ABNORMAL HIGH (ref 0.8–1.2)
Prothrombin Time: 22.9 seconds — ABNORMAL HIGH (ref 11.4–15.2)

## 2023-01-01 MED ORDER — LOSARTAN POTASSIUM 25 MG PO TABS: 25.0000 mg | ORAL_TABLET | Freq: Every day | ORAL | Status: DC

## 2023-01-01 MED ORDER — CLOPIDOGREL BISULFATE 75 MG PO TABS
75.0000 mg | ORAL_TABLET | Freq: Every day | ORAL | Status: DC
  Administered 2023-01-01 – 2023-01-06 (×6): 75 mg via ORAL
  Filled 2023-01-01 (×6): qty 1

## 2023-01-01 MED ORDER — ONDANSETRON HCL 4 MG/2ML IJ SOLN: 4.0000 mg | Freq: Four times a day (QID) | INTRAMUSCULAR | Status: DC | PRN

## 2023-01-01 MED ORDER — NITROGLYCERIN 0.4 MG SL SUBL: 0.4000 mg | SUBLINGUAL_TABLET | SUBLINGUAL | Status: DC | PRN

## 2023-01-01 MED ORDER — ORAL CARE MOUTH RINSE: 15.0000 mL | OROMUCOSAL | Status: DC | PRN

## 2023-01-01 MED ORDER — HYDRALAZINE HCL 20 MG/ML IJ SOLN: 10.0000 mg | Freq: Four times a day (QID) | INTRAMUSCULAR | Status: DC | PRN

## 2023-01-01 MED ORDER — ACETAMINOPHEN 650 MG RE SUPP: 650.0000 mg | Freq: Four times a day (QID) | RECTAL | Status: DC | PRN

## 2023-01-01 MED ORDER — CARBIDOPA-LEVODOPA ER 50-200 MG PO TBCR
1.0000 | EXTENDED_RELEASE_TABLET | Freq: Every day | ORAL | Status: DC
  Administered 2023-01-01 – 2023-01-05 (×5): 1 via ORAL
  Filled 2023-01-01 (×8): qty 1

## 2023-01-01 MED ORDER — MORPHINE SULFATE (PF) 2 MG/ML IV SOLN
2.0000 mg | INTRAVENOUS | Status: DC | PRN
  Administered 2023-01-03 (×2): 2 mg via INTRAVENOUS
  Filled 2023-01-01 (×2): qty 1

## 2023-01-01 MED ORDER — ONDANSETRON HCL 4 MG PO TABS: 4.0000 mg | ORAL_TABLET | Freq: Four times a day (QID) | ORAL | Status: DC | PRN

## 2023-01-01 MED ORDER — ATORVASTATIN CALCIUM 80 MG PO TABS
80.0000 mg | ORAL_TABLET | Freq: Every day | ORAL | Status: DC
  Administered 2023-01-01 – 2023-01-06 (×5): 80 mg via ORAL
  Filled 2023-01-01 (×6): qty 1

## 2023-01-01 MED ORDER — ASPIRIN 81 MG PO CHEW
81.0000 mg | CHEWABLE_TABLET | Freq: Every day | ORAL | Status: DC
  Administered 2023-01-01 – 2023-01-06 (×5): 81 mg via ORAL
  Filled 2023-01-01 (×6): qty 1

## 2023-01-01 MED ORDER — LACTATED RINGERS IV SOLN: INTRAVENOUS | Status: AC

## 2023-01-01 MED ORDER — MELATONIN 5 MG PO TABS
5.0000 mg | ORAL_TABLET | Freq: Every day | ORAL | Status: DC
  Administered 2023-01-01 – 2023-01-05 (×5): 5 mg via ORAL
  Filled 2023-01-01 (×5): qty 1

## 2023-01-01 MED ORDER — ACETAMINOPHEN 325 MG PO TABS
650.0000 mg | ORAL_TABLET | Freq: Four times a day (QID) | ORAL | Status: DC | PRN
  Administered 2023-01-01 – 2023-01-03 (×4): 650 mg via ORAL
  Filled 2023-01-01 (×4): qty 2

## 2023-01-01 MED ORDER — PERFLUTREN LIPID MICROSPHERE
1.0000 mL | INTRAVENOUS | Status: AC | PRN
  Administered 2023-01-01: 2 mL via INTRAVENOUS

## 2023-01-01 MED ORDER — METOPROLOL SUCCINATE ER 50 MG PO TB24
50.0000 mg | ORAL_TABLET | Freq: Every day | ORAL | Status: DC
  Administered 2023-01-01 – 2023-01-06 (×5): 50 mg via ORAL
  Filled 2023-01-01 (×6): qty 1

## 2023-01-01 NOTE — Assessment & Plan Note (Signed)
-   A1c 6.4. - Currently patient is NPO. - Continue IV fluid LR 75 cc/h. - Once diet will be resumed start sliding scale SSI.

## 2023-01-01 NOTE — Assessment & Plan Note (Signed)
Continue Lipitor 80 mg daily.

## 2023-01-01 NOTE — Progress Notes (Signed)
PROGRESS NOTE    [name]  AYT:[id number] DOB: 11-09-1945 DOA: 12/31/2022 PCP: [name], NP   Brief Narrative:  This is a 77 year old man medical history significant for CAD s/p CABG in 2015, multiple cath most recent on 07/28/2021, aortic stenosis s/p aortic root replacement with St Jude mechanical AVR, complete heart block s/p pacemaker placement, hypertension, hyperlipidemia, non-insulin-dependent diabetes mellitus type 2 and Parkinson disease presented to emergency department at [facility] with complaining of several days of chest pain worse with exertion but ongoing even at rest, not subsiding with sublingual nitroglycerin at home.  Assessment & Plan:   Principal Problem:   Unstable angina (HCC) Active Problems:   CAD (coronary artery disease), autologous vein bypass graft   HTN (hypertension)   History of permanent cardiac pacemaker placement   History of transcatheter aortic valve replacement (TAVR)   HLD (hyperlipidemia)   DM type 2 (diabetes mellitus, type 2) (HCC)   Parkinson disease  Acute unstable angina (HCC) -Patient loaded with 81 mg aspirin -Continue nitro, morphine per protocol, oxygen as appropriate -Continue heparin drip, metoprolol, atorvastatin, Plavix  -Cardiology consulted, continue telemetry -Echocardiogram pending -Further workup per cardiology -tentative plan for cardiac catheterization Monday 01/03/2023    Hypertension, well-controlled -Continue metoprolol, hold losartan -PRN hydralazine   Heart failure, grade 1 diastolic dysfunction, not in acute exacerbation -Most recent echo November 2023 preserved EF -Continue metoprolol, ARB held  CAD (coronary artery disease), autologous vein bypass graft - Continue Toprol-XL 50 mg daily, Lipitor 80 mg daily and aspirin. - Continue tele   Parkinson disease - Carbidopa levodopa 50/200 mg at bedtime.   DM type 2 (diabetes mellitus, type 2) (HCC), well-controlled -Continue diabetic  diet -A1c 6.4. -Continue sliding scale, hypoglycemic protocol   HLD (hyperlipidemia) - Continue Lipitor 80 mg daily  DVT prophylaxis: SCDs Start: 01/01/23 0141 Code Status:   Code Status: Full Code Family Communication: Multiple family members at bedside  Status is: Inpt  Dispo: The patient is from: Home              Anticipated d/c is to: Home              Anticipated d/c date is: 48h              Patient currently NOT medically stable for discharge, awaiting cardiac evaluation and catheterization  Consultants:  Cardiology  Procedures:  Cardiac cath planned 6/24  Antimicrobials:  None  Subjective: No acute issues or events overnight, chest pain ongoing but somewhat improved from prior.  Worse with exertion but again continues at rest.  Otherwise denies nausea vomiting diarrhea constipation headache fevers chills shortness of breath.  Objective: Vitals:   12/31/22 2315 01/01/23 0122 01/01/23 0410 01/01/23 0532  BP:  (!) 144/72 124/63 (!) 144/86  Pulse: 76 93 75 87  Resp: 19  16   Temp:  98.1 F (36.7 C) 98 F (36.7 C)   TempSrc:  Oral Oral   SpO2: 97% 95% 94% 96%  Weight:  84.2 kg    Height:  5' 6.5" (1.689 m)      Intake/Output Summary (Last 24 hours) at 01/01/2023 0727 Last data filed at 01/01/2023 3557 Gross per 24 hour  Intake 32.76 ml  Output 50 ml  Net -17.24 ml   Filed Weights   12/31/22 2211 01/01/23 0122  Weight: 86.2 kg 84.2 kg    Examination: General:  Pleasantly resting in bed, No acute distress. HEENT:  Normocephalic atraumatic.  Sclerae nonicteric, noninjected.  Extraocular movements intact bilaterally. Neck:  Without mass or deformity.  Trachea is midline. Lungs:  Clear to auscultate bilaterally without rhonchi, wheeze, or rales. Heart: Holosystolic murmur PMI left sternal border without rubs or gallops. Abdomen:  Soft, nontender, nondistended.  Without guarding or rebound. Extremities: Without cyanosis, clubbing, edema, or obvious  deformity. Skin:  Warm and dry, no erythema  Data Reviewed: I have personally reviewed following labs and imaging studies  CBC: Recent Labs  Lab 12/31/22 2141  WBC 8.2  HGB 13.9  HCT 42.0  MCV 91.3  PLT 224   Basic Metabolic Panel: Recent Labs  Lab 12/31/22 2141  NA 138  K 3.9  CL 102  CO2 30  GLUCOSE 145*  BUN 17  CREATININE 0.85  CALCIUM 9.7   GFR: Estimated Creatinine Clearance: 73.7 mL/min (by C-G formula based on SCr of 0.85 mg/dL).  Coagulation Profile: Recent Labs  Lab 12/31/22 2141  INR 1.9*   No results found for this or any previous visit (from the past 240 hour(s)).   Radiology Studies: DG Chest Port 1 View  Result Date: 12/31/2022 CLINICAL DATA:  Chest pain. EXAM: PORTABLE CHEST 1 VIEW COMPARISON:  May 25, 2022 FINDINGS: There is stable dual lead AICD positioning. Multiple sternal wires and vascular clips are noted. The heart size and mediastinal contours are within normal limits. An artificial aortic valve is seen. Mild, chronic appearing increased lung markings are seen without evidence of an acute infiltrate, pleural effusion or pneumothorax. A radiopaque surgical screw is seen overlying the left humeral head. Multilevel degenerative changes are seen throughout the thoracic spine. IMPRESSION: 1. Evidence of prior median sternotomy/CABG and artificial aortic valve placement. 2. No acute or active cardiopulmonary disease. Electronically Signed   By: Aram Candela M.D.   On: 12/31/2022 22:29    Scheduled Meds:  aspirin  81 mg Oral Daily   atorvastatin  80 mg Oral Daily   carbidopa-levodopa  1 tablet Oral QHS   clopidogrel  75 mg Oral Daily   melatonin  5 mg Oral QHS   metoprolol succinate  50 mg Oral Daily   Continuous Infusions:  heparin 900 Units/hr (12/31/22 2332)   lactated ringers 75 mL/hr at 01/01/23 0312     LOS: 0 days   Time spent:  Azucena Fallen, DO [facility]  If 7PM-7AM, please contact  night-coverage [email or web address]  01/01/2023, 7:27 AM

## 2023-01-01 NOTE — Assessment & Plan Note (Addendum)
-   Patient has several episodes of chest pain at rest over the course of last 2 to 3 days not resolving with sublingual nitroglycerin. - Troponin 17 and 21 without any delta change.  EKG showed atrial and ventricular paced rhythm. - Cardiology evaluated patient recommended heparin drip, continue aspirin 81 mg daily and keep patient NPO. -Continue heparin drip per pharmacy consult -Continue nitroglycerin 0.4 mg sublingual every 5-minute as needed continue morphine 2 mg every 2 hours as needed for severe pain. - Continue [address] oxygen to keep O2 sat above 92% -Continue metoprolol succinate 50 mg daily - Continue Lipitor 80 mg daily - Obtaining echocardiogram. -Continue telemonitoring

## 2023-01-01 NOTE — Consult Note (Signed)
Cardiology Consultation   Patient ID: [name] MRN: [id number]; DOB: 1945-10-15  Admit date: 12/31/2022 Date of Consult: 01/01/2023  PCP:  [name], NP   [facility] Providers Cardiologist:  Donato Schultz, MD  Electrophysiologist:  Lewayne Bunting, MD       Patient Profile:   [name] is a 77 y.o. male with a hx of  of CAD (s/p CABGx2 in 2005 with LIMA-LAD and SVG-LCx), aortic stenosis (s/p Bentall aortic root replacement with St. Jude mechanical valve), CHB (s/p PPM placement), ascending thoracic aortic aneurysm, Parkinson's disease, HTN, HLD and Type 2 DM  who is being seen 01/01/2023 for the evaluation of chest pain at the request of IM.  History of Present Illness:   [name] has been having worsening chest discomfort over the past few days, He usually requires 1 or 2 sublingual NTG per month and over the past 2 weeks he has been using at least one every morning. The pain is described as retrosternal pressure like sensation, 7/10, non radiating, triggered mostly after waking up and relieving with sublingual NTG. The patient does not ambulate much due to Parkinson's and the exertional component is difficult to assess. Most of his episodes have happened at rest. The total duration of the pain is usually between 10-15 min and resembles his prior ischemic pain. Because of this reason his wife convinced him to go to the ER. Last year he had similar symptoms and underwent PCI to his Lcx. He felt much better after the procedure. Upon arrival to the ED his vitals were stable. Initial workup showed normal Cr, INR of 1.9 and troponins of 17 and 20. No cardiopulmonary issues on CXR and ECG showing V paced rhythm with downsloping  ST changes in V2. Patient was transferred for further evaluation  Past Medical History:  Diagnosis Date   Acute renal failure (HCC) 02/09/2013   Aortic stenosis 08/25/2010   a. Bentall aortic root replacement with a St. Jude mechanical valve  and Hemashield conduit 02/2004.   Arthritis    Asthma    AV BLOCK, COMPLETE    a. s/p St Jude dual chamber pacemaker 02/2004.   CAD (coronary artery disease)    a. s/p CABGx2 (LIMA-dLAD, SVG-Cx). b. Low risk nuc 12/2012 without ischemia, EF 46% mild apical hypokinesia (EF 55% inf HK by echo).   Carotid artery disease (HCC)    a. 0-39% bilateral ICA stenosis, stable mild hard plaque in carotid bulbs. F/u 03/2014 recommended.   Diabetes mellitus without complication (HCC)    type 2    DIVERTICULOSIS, COLON 10/17/2007   Ejection fraction    a. EF 55% with inf HK, mild MR by echo 12/2012.   GERD (gastroesophageal reflux disease)    hxof    GOUT 01/16/2007   Headache    hx of migraines    HEMORRHOIDS, INTERNAL 11/01/2008   Hx of adenomatous polyp of colon 02/2019   no recall needed   HYPERLIPIDEMIA 01/16/2007   HYPERTENSION 01/16/2007   Impaired glucose tolerance    LEG EDEMA 11/27/2008   Special screening for malignant neoplasm of prostate    Warfarin anticoagulation    AVR    Past Surgical History:  Procedure Laterality Date   AORTIC VALVE REPLACEMENT     CARDIAC CATHETERIZATION  06/2003   CIRCUMCISION     COLONOSCOPY     CORONARY ARTERY BYPASS GRAFT     CORONARY ATHERECTOMY N/A 05/28/2022   Procedure: CORONARY ATHERECTOMY;  Surgeon: Swaziland, Peter M, MD;  Location: [facility] INVASIVE CV LAB;  Service: Cardiovascular;  Laterality: N/A;   CORONARY LITHOTRIPSY N/A 05/28/2022   Procedure: CORONARY LITHOTRIPSY;  Surgeon: Swaziland, Peter M, MD;  Location: [facility] INVASIVE CV LAB;  Service: Cardiovascular;  Laterality: N/A;   CORONARY STENT INTERVENTION N/A 05/28/2022   Procedure: CORONARY STENT INTERVENTION;  Surgeon: Swaziland, Peter M, MD;  Location: [facility] INVASIVE CV LAB;  Service: Cardiovascular;  Laterality: N/A;   coronary stents      prior to bypass    EP IMPLANTABLE DEVICE N/A 05/06/2016   Procedure: PPM Generator Changeout;  Surgeon: Marinus Maw, MD;  Location: [facility] INVASIVE CV LAB;  Service:  Cardiovascular;  Laterality: N/A;   ESOPHAGOGASTRODUODENOSCOPY     HERNIA REPAIR     INGUINAL HERNIA REPAIR Bilateral 02/07/2013   Procedure: LAPAROSCOPIC BILATERAL INGUINAL HERNIA REPAIR;  Surgeon: Wilmon Arms. Corliss Skains, MD;  Location: [facility] ORS;  Service: General;  Laterality: Bilateral;   INSERTION OF MESH Bilateral 02/07/2013   Procedure: INSERTION OF MESH;  Surgeon: Wilmon Arms. Corliss Skains, MD;  Location: [facility] ORS;  Service: General;  Laterality: Bilateral;   LEFT HEART CATH AND CORS/GRAFTS ANGIOGRAPHY N/A 05/27/2022   Procedure: LEFT HEART CATH AND CORS/GRAFTS ANGIOGRAPHY;  Surgeon: Corky Crafts, MD;  Location: [facility] INVASIVE CV LAB;  Service: Cardiovascular;  Laterality: N/A;   LUMBAR LAMINECTOMY/DECOMPRESSION MICRODISCECTOMY N/A 03/15/2018   Procedure: Complete decompressive lumbar laminectomy for spinal stenosis of L4-L5 and foraminotomy for L4-L5 root on right;  Surgeon: Ranee Gosselin, MD;  Location: [facility] ORS;  Service: Orthopedics;  Laterality: N/A;   PACEMAKER INSERTION     SHOULDER SURGERY Bilateral    SKIN BIOPSY  05/04/2019   BASAL CELL CARCINOMA//MID CENTRAL NASAL   SKIN BIOPSY  10/11/2019   Superficial basal cell carcinoma - left superior central forehead shave     Home Medications:  Prior to Admission medications   Medication Sig Start Date End Date Taking? Authorizing Provider  ACCU-CHEK GUIDE test strip USE AS INSTRUCTED 06/22/22  Yes Nafziger, Kandee Keen, NP  Accu-Chek Softclix Lancets lancets USE TO CHECK BLOOD SUGAR TWICE A DAY AND AS NEEDED 06/22/22  Yes Nafziger, Kandee Keen, NP  acetaminophen (TYLENOL) 650 MG CR tablet Take 1,300 mg by mouth 2 (two) times daily.   Yes [provider]  atorvastatin (LIPITOR) 80 MG tablet Take 1 tablet (80 mg total) by mouth daily. 09/21/22  Yes Jake Bathe, MD  Blood Glucose Monitoring Suppl (ACCU-CHEK GUIDE) w/Device KIT Use as directed 06/18/21  Yes Nafziger, Kandee Keen, NP  carbidopa-levodopa (SINEMET CR) 50-200 MG tablet Take 1 tablet by mouth at  bedtime.   Yes [provider]  clopidogrel (PLAVIX) 75 MG tablet Take 1 tablet (75 mg total) by mouth daily. 09/21/22  Yes Jake Bathe, MD  furosemide (LASIX) 40 MG tablet TAKE 1 TABLET EVERY DAY 08/10/22  Yes Nafziger, Kandee Keen, NP  losartan (COZAAR) 25 MG tablet Take 1 tablet (25 mg total) by mouth daily. 11/19/22  Yes Jake Bathe, MD  melatonin 5 MG TABS Take 5 mg by mouth at bedtime.   Yes [provider]  metFORMIN (GLUCOPHAGE) 500 MG tablet TAKE 1/2 TABLET EVERY DAY WITH BREAKFAST (SCHEDULE APPT FOR FUTURE REFILLS) 10/25/22  Yes Nafziger, Kandee Keen, NP  metoprolol succinate (TOPROL-XL) 50 MG 24 hr tablet Take 1 tablet (50 mg total) by mouth daily. Take with or immediately following a meal. 11/19/22  Yes Jake Bathe, MD  nitroGLYCERIN (NITROSTAT) 0.4 MG SL tablet PLACE 1 TABLET (  0.4 MG TOTAL) UNDER THE TONGUE EVERY 5 (FIVE) MINUTES AS NEEDED. FOR CHEST PAIN 09/30/22  Yes Jake Bathe, MD  vitamin B-12 (CYANOCOBALAMIN) 1000 MCG tablet Take 1,000 mcg by mouth daily.   Yes [provider]  warfarin (COUMADIN) 10 MG tablet TAKE 1/2 TO 1 TABLET DAILY OR AS DIRECTED BY COUMADIN CLINIC 08/09/22  Yes Jake Bathe, MD  albuterol (VENTOLIN HFA) 108 (90 Base) MCG/ACT inhaler INHALE 2 PUFFS INTO THE LUNGS EVERY 6 HOURS AS NEEDED FOR WHEEZING OR SHORTNESS OF BREATH. 12/14/22   Nafziger, Kandee Keen, NP  Ascorbic Acid (VITAMIN C) 1000 MG tablet Take 1,000 mg by mouth daily.    [provider]  calcium carbonate (OS-CAL) 600 MG TABS Take 600 mg by mouth 2 (two) times daily with a meal.    [provider]  carbidopa-levodopa (SINEMET IR) 25-100 MG tablet Take 1 tablet by mouth 4 (four) times daily.    [provider]  cetirizine (ZYRTEC) 10 MG tablet Take 10 mg by mouth daily.     [provider]  cholecalciferol (VITAMIN D) 25 MCG (1000 UNIT) tablet Take 1,000 Units by mouth daily.    [provider]  fluticasone (FLONASE) 50 MCG/ACT nasal spray  Place 2 sprays into both nostrils daily. 12/28/18   Nafziger, Kandee Keen, NP  Misc Natural Products (TART CHERRY ADVANCED) CAPS Take 2 capsules by mouth daily.     [provider]  Multiple Vitamin (MULTIVITAMIN) tablet Take 1 tablet by mouth daily.      [provider]  potassium chloride (KLOR-CON) 10 MEQ tablet TAKE 1 TABLET EVERY DAY 08/10/22   Nafziger, Kandee Keen, NP  Probiotic Product (PHILLIPS COLON HEALTH PO) Take 1 capsule by mouth daily.    [provider]    Inpatient Medications: Scheduled Meds:  atorvastatin  80 mg Oral Daily   carbidopa-levodopa  1 tablet Oral QHS   losartan  25 mg Oral Daily   melatonin  5 mg Oral QHS   metoprolol succinate  50 mg Oral Daily   Continuous Infusions:  heparin 900 Units/hr (12/31/22 2332)   PRN Meds: acetaminophen **OR** acetaminophen, hydrALAZINE, morphine injection, nitroGLYCERIN, ondansetron **OR** ondansetron (ZOFRAN) IV, mouth rinse  Allergies:    Allergies  Allergen Reactions   Bee Venom Shortness Of Breath and Swelling   Codeine Phosphate Hives, Shortness Of Breath, Itching, Rash and Other (See Comments)    Tolerated multiple doses of IV morphine   Metoprolol Tartrate Other (See Comments)    loss of vision. Can take the XL tablet not regular    Pramipexole Other (See Comments)    Hallucinations     Tramadol     Pt has hallucinations    Ramipril Rash and Cough    Social History:   Social History   Socioeconomic History   Marital status: Married    Spouse name: Not on file   Number of children: 3   Years of education: Not on file   Highest education level: Not on file  Occupational History   Occupation: retired  Tobacco Use   Smoking status: Former    Types: Cigarettes    Quit date: 07/12/1973    Years since quitting: 49.5   Smokeless tobacco: Never  Vaping Use   Vaping Use: Never used  Substance and Sexual Activity   Alcohol use: No   Drug use: No   Sexual activity: Not on file  Other Topics  Concern   Not on file  Social History Narrative  The patient is married and retired   He reports 3 grown children   Former smoker, no alcohol or tobacco or drug use at this time   Social Determinants of Corporate investment banker Strain: Low Risk  (06/17/2022)   Overall Financial Resource Strain (CARDIA)    Difficulty of Paying Living Expenses: Not hard at all  Food Insecurity: No Food Insecurity (01/01/2023)   Hunger Vital Sign    Worried About Running Out of Food in the Last Year: Never true    Ran Out of Food in the Last Year: Never true  Transportation Needs: No Transportation Needs (01/01/2023)   PRAPARE - Administrator, Civil Service (Medical): No    Lack of Transportation (Non-Medical): No  Physical Activity: Inactive (06/17/2022)   Exercise Vital Sign    Days of Exercise per Week: 0 days    Minutes of Exercise per Session: 0 min  Stress: No Stress Concern Present (06/17/2022)   [employer] of Occupational Health - Occupational Stress Questionnaire    Feeling of Stress : Not at all  Social Connections: Socially Integrated (06/17/2022)   Social Connection and Isolation Panel [NHANES]    Frequency of Communication with Friends and Family: More than three times a week    Frequency of Social Gatherings with Friends and Family: More than three times a week    Attends Religious Services: More than 4 times per year    Active Member of [employer] or Organizations: Yes    Attends Engineer, structural: More than 4 times per year    Marital Status: Married  Catering manager Violence: Not At Risk (01/01/2023)   Humiliation, Afraid, Rape, and Kick questionnaire    Fear of Current or Ex-Partner: No    Emotionally Abused: No    Physically Abused: No    Sexually Abused: No    Family History:   Family History  Problem Relation Age of Onset   Heart attack Father    Kidney cancer Father    Hypertension Mother    Diabetes Mother    Diabetes Brother    Epilepsy  Sister    Diabetes Sister    Diabetes Brother    Colon cancer Neg Hx    Rectal cancer Neg Hx    Stomach cancer Neg Hx    Esophageal cancer Neg Hx      ROS:  Please see the history of present illness.   All other ROS reviewed and negative.     Physical Exam/Data:   Vitals:   12/31/22 2211 12/31/22 2300 12/31/22 2315 01/01/23 0122  BP: (!) 142/78 (!) 152/88  (!) 144/72  Pulse: 75 75 76 93  Resp: 16 18 19    Temp:    98.1 F (36.7 C)  TempSrc:    Oral  SpO2: 96% 98% 97% 95%  Weight: 86.2 kg   84.2 kg  Height: 5' 6.5" (1.689 m)   5' 6.5" (1.689 m)   No intake or output data in the 24 hours ending 01/01/23 0159    01/01/2023    1:22 AM 12/31/2022   10:11 PM 11/19/2022   11:40 AM  Last 3 Weights  Weight (lbs) 185 lb 9.6 oz 190 lb 0.6 oz 190 lb  Weight (kg) 84.188 kg 86.2 kg 86.183 kg     Body mass index is 29.51 kg/m.  General:  Well nourished, well developed, in no acute distres HEENT: normal Neck: no JVD Vascular: No carotid bruits; Distal pulses 2+ bilaterally  Cardiac:  normal S1, S2; RRR; no murmur Lungs:  clear to auscultation bilaterally, no wheezing, rhonchi or rales  Abd: soft, nontender, no hepatomegaly  Ext: no edema Musculoskeletal:  No deformities, BUE and BLE strength normal and equal Skin: warm and dry  Neuro:  CNs 2-12 intact, no focal abnormalities noted Psych:  Normal affect   EKG:  The EKG was personally reviewed and demonstrates:  V paced Telemetry:  Telemetry was personally reviewed and demonstrates:  V paced  Relevant CV Studies:  Laboratory Data:  High Sensitivity Troponin:   Recent Labs  Lab 12/31/22 2141 12/31/22 2339  TROPONINIHS 17 20*     Chemistry Recent Labs  Lab 12/31/22 2141  NA 138  K 3.9  CL 102  CO2 30  GLUCOSE 145*  BUN 17  CREATININE 0.85  CALCIUM 9.7  GFRNONAA >60  ANIONGAP 6    No results for input(s): "PROT", "ALBUMIN", "AST", "ALT", "ALKPHOS", "BILITOT" in the last 168 hours. Lipids No results for  input(s): "CHOL", "TRIG", "HDL", "LABVLDL", "LDLCALC", "CHOLHDL" in the last 168 hours.  Hematology Recent Labs  Lab 12/31/22 2141  WBC 8.2  RBC 4.60  HGB 13.9  HCT 42.0  MCV 91.3  MCH 30.2  MCHC 33.1  RDW 15.0  PLT 224   Thyroid No results for input(s): "TSH", "FREET4" in the last 168 hours.  BNPNo results for input(s): "BNP", "PROBNP" in the last 168 hours.  DDimer No results for input(s): "DDIMER" in the last 168 hours.   Radiology/Studies:  DG Chest Port 1 View  Result Date: 12/31/2022 CLINICAL DATA:  Chest pain. EXAM: PORTABLE CHEST 1 VIEW COMPARISON:  May 25, 2022 FINDINGS: There is stable dual lead AICD positioning. Multiple sternal wires and vascular clips are noted. The heart size and mediastinal contours are within normal limits. An artificial aortic valve is seen. Mild, chronic appearing increased lung markings are seen without evidence of an acute infiltrate, pleural effusion or pneumothorax. A radiopaque surgical screw is seen overlying the left humeral head. Multilevel degenerative changes are seen throughout the thoracic spine. IMPRESSION: 1. Evidence of prior median sternotomy/CABG and artificial aortic valve placement. 2. No acute or active cardiopulmonary disease. Electronically Signed   By: Aram Candela M.D.   On: 12/31/2022 22:29     Assessment and Plan:   77 y/o with PMHx as detailed above, who was admitted with unstable angina.  Unstable angina CAD s/p CABG and recent PCI in 2023 s/p Bentall aortic root replacement with St. Jude mechanical valve CHB s/p PPM placement Parkinson's disease HTN HLD DM  Plan: - Patient loaded with ASA. Continue 81 mg daily. If no intervention we can drop the ASA again - Continue heparin drip - Recheck INR in the morning - Keep NPO for now for possible LHC/PCI - Sublingual NTG PRN - Continue Toprol 50 mg daily - Continue Atorvastatin 80 mg daily - Patient on plavix at home after his last PCI. Plan was to  continue for 1 year (05/2023). Continue.  - Telemetry monitoring   Risk Assessment/Risk Scores:     TIMI Risk Score for Unstable Angina or Non-ST Elevation MI:   The patient's TIMI risk score is  , which indicates a  % risk of all cause mortality, new or recurrent myocardial infarction or need for urgent revascularization in the next 14 days.          For questions or updates, please contact [facility] Please consult [email or web address] for contact info under  Signed, Regan Rakers, MD  01/01/2023 1:59 AM

## 2023-01-01 NOTE — Assessment & Plan Note (Signed)
-   Continue Toprol-XL 50 mg daily, Lipitor 80 mg daily and aspirin. - Continue telemonitoring.

## 2023-01-01 NOTE — Assessment & Plan Note (Addendum)
Last echo 05/26/2022 showed preserved EF 50 to 55%, mild left ventricular hypertrophy, grade 1 diastolic dysfunction. - Continue Toprol-XL 50 mg daily. -Hold losartan in case patient going to have any cath in the morning. -As needed hydralazine on board.

## 2023-01-01 NOTE — Progress Notes (Signed)
ANTICOAGULATION CONSULT NOTE  Pharmacy Consult for heparin  Indication: chest pain/ACS  Allergies  Allergen Reactions   Bee Venom Shortness Of Breath and Swelling   Codeine Phosphate Hives, Shortness Of Breath, Itching, Rash and Other (See Comments)    Tolerated multiple doses of IV morphine   Metoprolol Tartrate Other (See Comments)    loss of vision. Can take the XL tablet not regular    Pramipexole Other (See Comments)    Hallucinations     Tramadol     Pt has hallucinations    Ramipril Rash and Cough    Patient Measurements: Height: 5' 6.5" (168.9 cm) Weight: 84.2 kg (185 lb 9.6 oz) IBW/kg (Calculated) : 64.95 Heparin Dosing Weight: 82.7kg   Vital Signs: Temp: 97.9 F (36.6 C) (06/22 1423) Temp Source: Oral (06/22 1423) BP: 132/75 (06/22 1423)  Labs: Recent Labs    12/31/22 2141 12/31/22 2339 01/01/23 0804 01/01/23 1728  HGB 13.9  --  13.1  --   HCT 42.0  --  39.5  --   PLT 224  --  170  --   APTT  --   --  53*  --   LABPROT 21.7*  --  22.9*  --   INR 1.9*  --  2.0*  --   HEPARINUNFRC  --   --  0.16* 0.38  CREATININE 0.85  --  0.74  --   TROPONINIHS 17 20*  --   --      Estimated Creatinine Clearance: 78.3 mL/min (by C-G formula based on SCr of 0.74 mg/dL).   Medical History: Past Medical History:  Diagnosis Date   Acute renal failure (HCC) 02/09/2013   Aortic stenosis 08/25/2010   a. Bentall aortic root replacement with a St. Jude mechanical valve and Hemashield conduit 02/2004.   Arthritis    Asthma    AV BLOCK, COMPLETE    a. s/p St Jude dual chamber pacemaker 02/2004.   CAD (coronary artery disease)    a. s/p CABGx2 (LIMA-dLAD, SVG-Cx). b. Low risk nuc 12/2012 without ischemia, EF 46% mild apical hypokinesia (EF 55% inf HK by echo).   Carotid artery disease (HCC)    a. 0-39% bilateral ICA stenosis, stable mild hard plaque in carotid bulbs. F/u 03/2014 recommended.   Diabetes mellitus without complication (HCC)    type 2    DIVERTICULOSIS, COLON  10/17/2007   Ejection fraction    a. EF 55% with inf HK, mild MR by echo 12/2012.   GERD (gastroesophageal reflux disease)    hxof    GOUT 01/16/2007   Headache    hx of migraines    HEMORRHOIDS, INTERNAL 11/01/2008   Hx of adenomatous polyp of colon 02/2019   no recall needed   HYPERLIPIDEMIA 01/16/2007   HYPERTENSION 01/16/2007   Impaired glucose tolerance    LEG EDEMA 11/27/2008   Special screening for malignant neoplasm of prostate    Warfarin anticoagulation    AVR    Assessment: Patient admitted with CC of chest pain. Patient with extensive cardiac history on warfarin for mechanical aortic valve. Last dose 12/31/22 at 20:15.   INR originally subtherapeutic at 1.9, now up to 2. Per cardiology, continue heparin and recheck INR in am.  Heparin level therapeutic at 0.38 this evening on heparin drip rate 1100 uts/hr. Hgb & plt wnl.  No issues with infusion or bleeding noted.   Goal of Therapy:  INR goal 2-3 per outpatient notes  Heparin level 0.3-0.7 units/ml Monitor platelets by anticoagulation  protocol: Yes   Plan:  Continue heparin to 1100 unit/hr  Check heparin level and CBC daily Monitor for s/sx of bleeding  F/u cardiology plans for possible LHC/PCI  F/u warfarin resumption once procedures complete     Leota Sauers Pharm.D. CPP, BCPS Clinical Pharmacist [phone] 01/01/2023 7:10 PM

## 2023-01-01 NOTE — Assessment & Plan Note (Signed)
Patient has history of complete heart block status post pacemaker misplacement. - Continue Toprol-XL 50 mg daily. - Continue telemonitoring.

## 2023-01-01 NOTE — Assessment & Plan Note (Signed)
-   Carbidopa levodopa 50/200 mg at bedtime.

## 2023-01-01 NOTE — Progress Notes (Signed)
Brief cardiology progress note:  77 yo man with PMH CAD with prior CABG and PCI, history of Bentall with mechanical AVR admitted for chest pain. hsTn 17, 20.  INR 1.9.  Patient without current chest pain. Wife at bedside. No urgent plans for cath over the weekend. INR 1.9. Continue heparin. Plan for cath on 6/24. Diet order placed. Instructed to call with any new symptoms.  Jodelle Red, MD, PhD, [facility]  [facility]  [place]  Heart & Vascular at [facility] [address] [place], [place] [address] [phone]

## 2023-01-01 NOTE — H&P (Addendum)
History and Physical    [name] ZOX:[id number] DOB: 08-31-1945 DOA: 12/31/2022  DOS: the patient was seen and examined on 01/01/2023  PCP: [name], NP   Patient coming from: Home   Chief Complaint:  Chief Complaint  Patient presents with   Chest Pain    HPI:  This is a 77 year old man medical history significant for CAD s/p CABG in 2015, multiple cath most recent on 07/28/2021, aortic stenosis s/p aortic root replacement with St Jude mechanical AVR, complete heart block s/p pacemaker placement, hypertension, hyperlipidemia, non-insulin-dependent diabetes mellitus type 2 and Parkinson disease presented to emergency department at [facility] with complaining of several days of chest pain not subsiding with sublingual nitroglycerin at home. During my evaluation patient reported that he is having ongoing chest pain at rest for last 2 to 3 days.  Since this morning his patient is ongoing and his wife requested him to come to the ED for evaluation. He denies any tingling, and numbness along the left side of the arm, headache, nausea.  Patient denies any other complaint at this time.   ED Course: Initial presentation to ED blood pressure 130/81, heart rate 89, respiratory to 18 on room air. EKG showed atrial and ventricular paced rhythm with prolonged AV conduction. High-sensitivity troponin 17 and 20 without any delta change. BMP unremarkable. CBC unremarkable. PT 21.7 INR 1.9 both are slightly elevated.  On heparin drip. Chest x-ray-no acute or active cardiopulmonary disease.  Evidence of prior medial sternectomy/CABG and artificial aortic valve placement.  Cardiology already has been evaluated patient in the emergency department and recommended heparin drip, aspirin and keep the patient n.p.o. for further evaluation.  Review of Systems:  Review of Systems  Constitutional: Negative.  Negative for chills and fever.  HENT:  Negative for tinnitus.   Eyes:   Negative for blurred vision and double vision.  Respiratory:  Negative for cough.   Cardiovascular:  Positive for chest pain. Negative for palpitations, orthopnea and leg swelling.  Gastrointestinal:  Negative for heartburn and nausea.  Musculoskeletal:  Negative for neck pain.  Neurological:  Positive for headaches. Negative for dizziness and tingling.       Complaining of mild headache after taking nitroglycerin  Psychiatric/Behavioral:  The patient is not nervous/anxious.     Past Medical History:  Diagnosis Date   Acute renal failure (HCC) 02/09/2013   Aortic stenosis 08/25/2010   a. Bentall aortic root replacement with a St. Jude mechanical valve and Hemashield conduit 02/2004.   Arthritis    Asthma    AV BLOCK, COMPLETE    a. s/p St Jude dual chamber pacemaker 02/2004.   CAD (coronary artery disease)    a. s/p CABGx2 (LIMA-dLAD, SVG-Cx). b. Low risk nuc 12/2012 without ischemia, EF 46% mild apical hypokinesia (EF 55% inf HK by echo).   Carotid artery disease (HCC)    a. 0-39% bilateral ICA stenosis, stable mild hard plaque in carotid bulbs. F/u 03/2014 recommended.   Diabetes mellitus without complication (HCC)    type 2    DIVERTICULOSIS, COLON 10/17/2007   Ejection fraction    a. EF 55% with inf HK, mild MR by echo 12/2012.   GERD (gastroesophageal reflux disease)    hxof    GOUT 01/16/2007   Headache    hx of migraines    HEMORRHOIDS, INTERNAL 11/01/2008   Hx of adenomatous polyp of colon 02/2019   no recall needed   HYPERLIPIDEMIA 01/16/2007   HYPERTENSION 01/16/2007   Impaired  glucose tolerance    LEG EDEMA 11/27/2008   Special screening for malignant neoplasm of prostate    Warfarin anticoagulation    AVR    Past Surgical History:  Procedure Laterality Date   AORTIC VALVE REPLACEMENT     CARDIAC CATHETERIZATION  06/2003   CIRCUMCISION     COLONOSCOPY     CORONARY ARTERY BYPASS GRAFT     CORONARY ATHERECTOMY N/A 05/28/2022   Procedure: CORONARY ATHERECTOMY;  Surgeon:  Swaziland, Peter M, MD;  Location: [facility] INVASIVE CV LAB;  Service: Cardiovascular;  Laterality: N/A;   CORONARY LITHOTRIPSY N/A 05/28/2022   Procedure: CORONARY LITHOTRIPSY;  Surgeon: Swaziland, Peter M, MD;  Location: [facility] INVASIVE CV LAB;  Service: Cardiovascular;  Laterality: N/A;   CORONARY STENT INTERVENTION N/A 05/28/2022   Procedure: CORONARY STENT INTERVENTION;  Surgeon: Swaziland, Peter M, MD;  Location: [facility] INVASIVE CV LAB;  Service: Cardiovascular;  Laterality: N/A;   coronary stents      prior to bypass    EP IMPLANTABLE DEVICE N/A 05/06/2016   Procedure: PPM Generator Changeout;  Surgeon: Marinus Maw, MD;  Location: [facility] INVASIVE CV LAB;  Service: Cardiovascular;  Laterality: N/A;   ESOPHAGOGASTRODUODENOSCOPY     HERNIA REPAIR     INGUINAL HERNIA REPAIR Bilateral 02/07/2013   Procedure: LAPAROSCOPIC BILATERAL INGUINAL HERNIA REPAIR;  Surgeon: Wilmon Arms. Corliss Skains, MD;  Location: [facility] ORS;  Service: General;  Laterality: Bilateral;   INSERTION OF MESH Bilateral 02/07/2013   Procedure: INSERTION OF MESH;  Surgeon: Wilmon Arms. Corliss Skains, MD;  Location: [facility] ORS;  Service: General;  Laterality: Bilateral;   LEFT HEART CATH AND CORS/GRAFTS ANGIOGRAPHY N/A 05/27/2022   Procedure: LEFT HEART CATH AND CORS/GRAFTS ANGIOGRAPHY;  Surgeon: Corky Crafts, MD;  Location: [facility] INVASIVE CV LAB;  Service: Cardiovascular;  Laterality: N/A;   LUMBAR LAMINECTOMY/DECOMPRESSION MICRODISCECTOMY N/A 03/15/2018   Procedure: Complete decompressive lumbar laminectomy for spinal stenosis of L4-L5 and foraminotomy for L4-L5 root on right;  Surgeon: Ranee Gosselin, MD;  Location: [facility] ORS;  Service: Orthopedics;  Laterality: N/A;   PACEMAKER INSERTION     SHOULDER SURGERY Bilateral    SKIN BIOPSY  05/04/2019   BASAL CELL CARCINOMA//MID CENTRAL NASAL   SKIN BIOPSY  10/11/2019   Superficial basal cell carcinoma - left superior central forehead shave     reports that he quit smoking about 49 years ago. His smoking use included  cigarettes. He has never used smokeless tobacco. He reports that he does not drink alcohol and does not use drugs.  Allergies  Allergen Reactions   Bee Venom Shortness Of Breath and Swelling   Codeine Phosphate Hives, Shortness Of Breath, Itching, Rash and Other (See Comments)    Tolerated multiple doses of IV morphine   Metoprolol Tartrate Other (See Comments)    loss of vision. Can take the XL tablet not regular    Pramipexole Other (See Comments)    Hallucinations     Tramadol     Pt has hallucinations    Ramipril Rash and Cough    Family History  Problem Relation Age of Onset   Heart attack Father    Kidney cancer Father    Hypertension Mother    Diabetes Mother    Diabetes Brother    Epilepsy Sister    Diabetes Sister    Diabetes Brother    Colon cancer Neg Hx    Rectal cancer Neg Hx    Stomach cancer Neg Hx    Esophageal cancer Neg Hx  Prior to Admission medications   Medication Sig Start Date End Date Taking? Authorizing Provider  ACCU-CHEK GUIDE test strip USE AS INSTRUCTED 06/22/22  Yes Nafziger, Kandee Keen, NP  Accu-Chek Softclix Lancets lancets USE TO CHECK BLOOD SUGAR TWICE A DAY AND AS NEEDED 06/22/22  Yes Nafziger, Kandee Keen, NP  acetaminophen (TYLENOL) 650 MG CR tablet Take 1,300 mg by mouth 2 (two) times daily.   Yes [provider]  atorvastatin (LIPITOR) 80 MG tablet Take 1 tablet (80 mg total) by mouth daily. 09/21/22  Yes Jake Bathe, MD  Blood Glucose Monitoring Suppl (ACCU-CHEK GUIDE) w/Device KIT Use as directed 06/18/21  Yes Nafziger, Kandee Keen, NP  carbidopa-levodopa (SINEMET CR) 50-200 MG tablet Take 1 tablet by mouth at bedtime.   Yes [provider]  clopidogrel (PLAVIX) 75 MG tablet Take 1 tablet (75 mg total) by mouth daily. 09/21/22  Yes Jake Bathe, MD  furosemide (LASIX) 40 MG tablet TAKE 1 TABLET EVERY DAY 08/10/22  Yes Nafziger, Kandee Keen, NP  losartan (COZAAR) 25 MG tablet Take 1 tablet (25 mg total) by mouth daily. 11/19/22  Yes Jake Bathe, MD  melatonin 5 MG TABS Take 5 mg by mouth at bedtime.   Yes [provider]  metFORMIN (GLUCOPHAGE) 500 MG tablet TAKE 1/2 TABLET EVERY DAY WITH BREAKFAST (SCHEDULE APPT FOR FUTURE REFILLS) 10/25/22  Yes Nafziger, Kandee Keen, NP  metoprolol succinate (TOPROL-XL) 50 MG 24 hr tablet Take 1 tablet (50 mg total) by mouth daily. Take with or immediately following a meal. 11/19/22  Yes Skains, Veverly Fells, MD  nitroGLYCERIN (NITROSTAT) 0.4 MG SL tablet PLACE 1 TABLET (0.4 MG TOTAL) UNDER THE TONGUE EVERY 5 (FIVE) MINUTES AS NEEDED. FOR CHEST PAIN 09/30/22  Yes Jake Bathe, MD  vitamin B-12 (CYANOCOBALAMIN) 1000 MCG tablet Take 1,000 mcg by mouth daily.   Yes [provider]  warfarin (COUMADIN) 10 MG tablet TAKE 1/2 TO 1 TABLET DAILY OR AS DIRECTED BY COUMADIN CLINIC 08/09/22  Yes Jake Bathe, MD  albuterol (VENTOLIN HFA) 108 (90 Base) MCG/ACT inhaler INHALE 2 PUFFS INTO THE LUNGS EVERY 6 HOURS AS NEEDED FOR WHEEZING OR SHORTNESS OF BREATH. 12/14/22   Nafziger, Kandee Keen, NP  Ascorbic Acid (VITAMIN C) 1000 MG tablet Take 1,000 mg by mouth daily.    [provider]  calcium carbonate (OS-CAL) 600 MG TABS Take 600 mg by mouth 2 (two) times daily with a meal.    [provider]  carbidopa-levodopa (SINEMET IR) 25-100 MG tablet Take 1 tablet by mouth 4 (four) times daily.    [provider]  cetirizine (ZYRTEC) 10 MG tablet Take 10 mg by mouth daily.     [provider]  cholecalciferol (VITAMIN D) 25 MCG (1000 UNIT) tablet Take 1,000 Units by mouth daily.    [provider]  fluticasone (FLONASE) 50 MCG/ACT nasal spray Place 2 sprays into both nostrils daily. 12/28/18   Nafziger, Kandee Keen, NP  Misc Natural Products (TART CHERRY ADVANCED) CAPS Take 2 capsules by mouth daily.     [provider]  Multiple Vitamin (MULTIVITAMIN) tablet Take 1 tablet by mouth daily.      [provider]  potassium chloride (KLOR-CON) 10 MEQ tablet TAKE 1 TABLET  EVERY DAY 08/10/22   Nafziger, Kandee Keen, NP  Probiotic Product (PHILLIPS COLON HEALTH PO) Take 1 capsule by mouth daily.    [provider]    Physical Exam: Vitals:   12/31/22 2211 12/31/22 2300 12/31/22 2315 01/01/23 0122  BP: Marland Kitchen)  142/78 (!) 152/88  (!) 144/72  Pulse: 75 75 76 93  Resp: 16 18 19    Temp:    98.1 F (36.7 C)  TempSrc:    Oral  SpO2: 96% 98% 97% 95%  Weight: 86.2 kg   84.2 kg  Height: 5' 6.5" (1.689 m)   5' 6.5" (1.689 m)    Physical Exam Constitutional:      General: He is not in acute distress.    Appearance: He is not ill-appearing.  Eyes:     Pupils: Pupils are equal, round, and reactive to light.  Cardiovascular:     Rate and Rhythm: Normal rate and regular rhythm.     Heart sounds: Normal heart sounds.  Pulmonary:     Effort: Pulmonary effort is normal.     Breath sounds: Normal breath sounds.  Chest:     Chest wall: No tenderness.  Abdominal:     General: Bowel sounds are normal.     Palpations: Abdomen is soft.  Musculoskeletal:     Right lower leg: No edema.  Skin:    General: Skin is warm.     Capillary Refill: Capillary refill takes less than 2 seconds.  Neurological:     Mental Status: He is alert and oriented to person, place, and time.  Psychiatric:        Mood and Affect: Mood normal.      Labs on Admission: I have personally reviewed following labs and imaging studies  CBC: Recent Labs  Lab 12/31/22 2141  WBC 8.2  HGB 13.9  HCT 42.0  MCV 91.3  PLT 224   Basic Metabolic Panel: Recent Labs  Lab 12/31/22 2141  NA 138  K 3.9  CL 102  CO2 30  GLUCOSE 145*  BUN 17  CREATININE 0.85  CALCIUM 9.7   GFR: Estimated Creatinine Clearance: 73.7 mL/min (by C-G formula based on SCr of 0.85 mg/dL). Liver Function Tests: No results for input(s): "AST", "ALT", "ALKPHOS", "BILITOT", "PROT", "ALBUMIN" in the last 168 hours. No results for input(s): "LIPASE", "AMYLASE" in the last 168 hours. No results for input(s): "AMMONIA"  in the last 168 hours. Coagulation Profile: Recent Labs  Lab 12/31/22 2141  INR 1.9*   Cardiac Enzymes: Recent Labs  Lab 12/31/22 2141 12/31/22 2339  TROPONINIHS 17 20*   BNP (last 3 results) No results for input(s): "BNP" in the last 8760 hours. HbA1C: No results for input(s): "HGBA1C" in the last 72 hours. CBG: No results for input(s): "GLUCAP" in the last 168 hours. Lipid Profile: No results for input(s): "CHOL", "HDL", "LDLCALC", "TRIG", "CHOLHDL", "LDLDIRECT" in the last 72 hours. Thyroid Function Tests: No results for input(s): "TSH", "T4TOTAL", "FREET4", "T3FREE", "THYROIDAB" in the last 72 hours. Anemia Panel: No results for input(s): "VITAMINB12", "FOLATE", "FERRITIN", "TIBC", "IRON", "RETICCTPCT" in the last 72 hours. Urine analysis:    Component Value Date/Time   COLORURINE YELLOW 01/26/2016 2031   APPEARANCEUR CLEAR 01/26/2016 2031   LABSPEC 1.016 01/26/2016 2031   PHURINE 6.0 01/26/2016 2031   GLUCOSEU NEGATIVE 01/26/2016 2031   HGBUR NEGATIVE 01/26/2016 2031   HGBUR negative 09/17/2009 0800   BILIRUBINUR NEGATIVE 01/26/2016 2031   BILIRUBINUR n 04/23/2015 1002   KETONESUR NEGATIVE 01/26/2016 2031   PROTEINUR NEGATIVE 01/26/2016 2031   UROBILINOGEN 0.2 04/23/2015 1002   UROBILINOGEN 0.2 02/14/2013 0910   NITRITE NEGATIVE 01/26/2016 2031   LEUKOCYTESUR NEGATIVE 01/26/2016 2031    Radiological Exams on Admission: I have personally reviewed images DG Chest [facility]  Result Date: 12/31/2022 CLINICAL DATA:  Chest pain. EXAM: PORTABLE CHEST 1 VIEW COMPARISON:  May 25, 2022 FINDINGS: There is stable dual lead AICD positioning. Multiple sternal wires and vascular clips are noted. The heart size and mediastinal contours are within normal limits. An artificial aortic valve is seen. Mild, chronic appearing increased lung markings are seen without evidence of an acute infiltrate, pleural effusion or pneumothorax. A radiopaque surgical screw is seen overlying  the left humeral head. Multilevel degenerative changes are seen throughout the thoracic spine. IMPRESSION: 1. Evidence of prior median sternotomy/CABG and artificial aortic valve placement. 2. No acute or active cardiopulmonary disease. Electronically Signed   By: Aram Candela M.D.   On: 12/31/2022 22:29    EKG: My personal interpretation of EKG shows: Atrial and ventricular paced rhythm.    Assessment/Plan Principal Problem:   Unstable angina (HCC) Active Problems:   CAD (coronary artery disease), autologous vein bypass graft   HTN (hypertension)   History of permanent cardiac pacemaker placement   History of transcatheter aortic valve replacement (TAVR)   HLD (hyperlipidemia)   DM type 2 (diabetes mellitus, type 2) (HCC)   Parkinson disease    Assessment and Plan: * Unstable angina (HCC) - Patient has several episodes of chest pain at rest over the course of last 2 to 3 days not resolving with sublingual nitroglycerin. - Troponin 17 and 21 without any delta change.  EKG showed atrial and ventricular paced rhythm. - Cardiology evaluated patient recommended heparin drip, continue aspirin 81 mg daily and keep patient NPO. -Continue heparin drip per pharmacy consult -Continue nitroglycerin 0.4 mg sublingual every 5-minute as needed continue morphine 2 mg every 2 hours as needed for severe pain. - Continue  oxygen to keep O2 sat above 92% -Continue metoprolol succinate 50 mg daily - Continue Lipitor 80 mg daily - Obtaining echocardiogram. -Continue telemonitoring   History of permanent cardiac pacemaker placement Patient has history of complete heart block status post pacemaker misplacement. - Continue Toprol-XL 50 mg daily. - Continue telemonitoring.  HTN (hypertension) Last echo 05/26/2022 showed preserved EF 50 to 55%, mild left ventricular hypertrophy, grade 1 diastolic dysfunction. - Continue Toprol-XL 50 mg daily. -Hold losartan in case patient going to have any  cath in the morning. -As needed hydralazine on board.  CAD (coronary artery disease), autologous vein bypass graft - Continue Toprol-XL 50 mg daily, Lipitor 80 mg daily and aspirin. - Continue telemonitoring.  Parkinson disease - Carbidopa levodopa 50/200 mg at bedtime.  DM type 2 (diabetes mellitus, type 2) (HCC) - A1c 6.4. - Currently patient is NPO. - Continue IV fluid LR 75 cc/h. - Once diet will be resumed start sliding scale SSI.  HLD (hyperlipidemia) - Continue Lipitor 80 mg daily     DVT prophylaxis: IV heparin gtts Code Status: Full Code Disposition Plan: Plan to discharge home in 1 to 2 days. Consults: Cardiology Admission status: Inpatient, Telemetry bed   Tereasa Coop, MD [facility] 01/01/2023, 3:02 AM    If 7PM-7AM, please contact night-coverage [email or web address] Password [email or web address]

## 2023-01-01 NOTE — Progress Notes (Signed)
ANTICOAGULATION CONSULT NOTE  Pharmacy Consult for heparin  Indication: chest pain/ACS  Allergies  Allergen Reactions   Bee Venom Shortness Of Breath and Swelling   Codeine Phosphate Hives, Shortness Of Breath, Itching, Rash and Other (See Comments)    Tolerated multiple doses of IV morphine   Metoprolol Tartrate Other (See Comments)    loss of vision. Can take the XL tablet not regular    Pramipexole Other (See Comments)    Hallucinations     Tramadol     Pt has hallucinations    Ramipril Rash and Cough    Patient Measurements: Height: 5' 6.5" (168.9 cm) Weight: 84.2 kg (185 lb 9.6 oz) IBW/kg (Calculated) : 64.95 Heparin Dosing Weight: 82.7kg   Vital Signs: Temp: 98.2 F (36.8 C) (06/22 0734) Temp Source: Oral (06/22 0734) BP: 130/81 (06/22 0734) Pulse Rate: 87 (06/22 0532)  Labs: Recent Labs    12/31/22 2141 12/31/22 2339 01/01/23 0804  HGB 13.9  --  13.1  HCT 42.0  --  39.5  PLT 224  --  170  APTT  --   --  53*  LABPROT 21.7*  --  22.9*  INR 1.9*  --  2.0*  HEPARINUNFRC  --   --  0.16*  CREATININE 0.85  --  0.74  TROPONINIHS 17 20*  --      Estimated Creatinine Clearance: 78.3 mL/min (by C-G formula based on SCr of 0.74 mg/dL).   Medical History: Past Medical History:  Diagnosis Date   Acute renal failure (HCC) 02/09/2013   Aortic stenosis 08/25/2010   a. Bentall aortic root replacement with a St. Jude mechanical valve and Hemashield conduit 02/2004.   Arthritis    Asthma    AV BLOCK, COMPLETE    a. s/p St Jude dual chamber pacemaker 02/2004.   CAD (coronary artery disease)    a. s/p CABGx2 (LIMA-dLAD, SVG-Cx). b. Low risk nuc 12/2012 without ischemia, EF 46% mild apical hypokinesia (EF 55% inf HK by echo).   Carotid artery disease (HCC)    a. 0-39% bilateral ICA stenosis, stable mild hard plaque in carotid bulbs. F/u 03/2014 recommended.   Diabetes mellitus without complication (HCC)    type 2    DIVERTICULOSIS, COLON 10/17/2007   Ejection fraction     a. EF 55% with inf HK, mild MR by echo 12/2012.   GERD (gastroesophageal reflux disease)    hxof    GOUT 01/16/2007   Headache    hx of migraines    HEMORRHOIDS, INTERNAL 11/01/2008   Hx of adenomatous polyp of colon 02/2019   no recall needed   HYPERLIPIDEMIA 01/16/2007   HYPERTENSION 01/16/2007   Impaired glucose tolerance    LEG EDEMA 11/27/2008   Special screening for malignant neoplasm of prostate    Warfarin anticoagulation    AVR    Assessment: Patient admitted with CC of chest pain. Patient with extensive cardiac history on warfarin for mechanical aortic valve. Last dose 12/31/22 at 20:15.   INR originally subtherapeutic at 1.9, now up to 2. Per cardiology, continue heparin and recheck INR in am. Heparin level subtherapeutic at 0.16, Hgb & plt wnl.  No issues with infusion or bleeding noted.   Goal of Therapy:  INR goal 2-3 per outpatient notes  Heparin level 0.3-0.7 units/ml Monitor platelets by anticoagulation protocol: Yes   Plan:  Increase heparin to 1100 unit/hr  Check 6 hour heparin level  Check heparin level and CBC daily Monitor for s/sx of bleeding  F/u  cardiology plans for possible LHC/PCI  F/u warfarin resumption once procedures complete   Andreas Ohm, PharmD Pharmacy Resident  01/01/2023 10:28 AM

## 2023-01-02 DIAGNOSIS — Z950 Presence of cardiac pacemaker: Secondary | ICD-10-CM

## 2023-01-02 DIAGNOSIS — Z952 Presence of prosthetic heart valve: Secondary | ICD-10-CM

## 2023-01-02 DIAGNOSIS — I25710 Atherosclerosis of autologous vein coronary artery bypass graft(s) with unstable angina pectoris: Secondary | ICD-10-CM

## 2023-01-02 LAB — CBC
HCT: 37 % — ABNORMAL LOW (ref 39.0–52.0)
Hemoglobin: 12.3 g/dL — ABNORMAL LOW (ref 13.0–17.0)
MCH: 30.2 pg (ref 26.0–34.0)
MCHC: 33.2 g/dL (ref 30.0–36.0)
MCV: 90.9 fL (ref 80.0–100.0)
Platelets: 171 10*3/uL (ref 150–400)
RBC: 4.07 MIL/uL — ABNORMAL LOW (ref 4.22–5.81)
RDW: 14.9 % (ref 11.5–15.5)
WBC: 8.4 10*3/uL (ref 4.0–10.5)
nRBC: 0 % (ref 0.0–0.2)

## 2023-01-02 LAB — BASIC METABOLIC PANEL
Anion gap: 8 (ref 5–15)
BUN: 13 mg/dL (ref 8–23)
CO2: 24 mmol/L (ref 22–32)
Calcium: 8.5 mg/dL — ABNORMAL LOW (ref 8.9–10.3)
Chloride: 105 mmol/L (ref 98–111)
Creatinine, Ser: 0.8 mg/dL (ref 0.61–1.24)
GFR, Estimated: >60 mL/min (ref >60)
Glucose, Bld: 94 mg/dL (ref 70–99)
Potassium: 3.6 mmol/L (ref 3.5–5.1)
Sodium: 137 mmol/L (ref 135–145)

## 2023-01-02 LAB — PROTIME-INR
INR: 1.1 (ref 0.8–1.2)
Prothrombin Time: 14.6 seconds (ref 11.4–15.2)

## 2023-01-02 LAB — HEPARIN LEVEL (UNFRACTIONATED): Heparin Unfractionated: 0.46 IU/mL (ref 0.30–0.70)

## 2023-01-02 MED ORDER — SODIUM CHLORIDE 0.9 % IV SOLN: 250.0000 mL | INTRAVENOUS | Status: DC | PRN

## 2023-01-02 MED ORDER — SODIUM CHLORIDE 0.9 % WEIGHT BASED INFUSION
1.0000 mL/kg/h | INTRAVENOUS | Status: DC
  Administered 2023-01-03: 1 mL/kg/h via INTRAVENOUS

## 2023-01-02 MED ORDER — SODIUM CHLORIDE 0.9% FLUSH: 3.0000 mL | INTRAVENOUS | Status: DC | PRN

## 2023-01-02 MED ORDER — ALBUTEROL SULFATE (2.5 MG/3ML) 0.083% IN NEBU
2.5000 mg | INHALATION_SOLUTION | Freq: Four times a day (QID) | RESPIRATORY_TRACT | Status: DC | PRN
  Administered 2023-01-03: 2.5 mg via RESPIRATORY_TRACT
  Filled 2023-01-02: qty 3

## 2023-01-02 MED ORDER — ASPIRIN 81 MG PO CHEW
81.0000 mg | CHEWABLE_TABLET | ORAL | Status: AC
  Administered 2023-01-03: 81 mg via ORAL

## 2023-01-02 MED ORDER — SODIUM CHLORIDE 0.9 % WEIGHT BASED INFUSION
3.0000 mL/kg/h | INTRAVENOUS | Status: DC
  Administered 2023-01-03: 3 mL/kg/h via INTRAVENOUS

## 2023-01-02 MED ORDER — SODIUM CHLORIDE 0.9% FLUSH
3.0000 mL | Freq: Two times a day (BID) | INTRAVENOUS | Status: DC
  Administered 2023-01-02 – 2023-01-05 (×4): 3 mL via INTRAVENOUS

## 2023-01-02 NOTE — Progress Notes (Signed)
PROGRESS NOTE    [name]  ZOX:[id number] DOB: December 29, 1945 DOA: 12/31/2022 PCP: [name], NP   Brief Narrative:  This is a 77 year old man medical history significant for CAD s/p CABG in 2015, multiple cath most recent on 07/28/2021, aortic stenosis s/p aortic root replacement with St Jude mechanical AVR, complete heart block s/p pacemaker placement, hypertension, hyperlipidemia, non-insulin-dependent diabetes mellitus type 2 and Parkinson disease presented to emergency department at [facility] with complaining of several days of chest pain worse with exertion but ongoing even at rest, not subsiding with sublingual nitroglycerin at home.  Assessment & Plan:   Principal Problem:   Unstable angina (HCC) Active Problems:   CAD (coronary artery disease), autologous vein bypass graft   HTN (hypertension)   History of permanent cardiac pacemaker placement   History of transcatheter aortic valve replacement (TAVR)   HLD (hyperlipidemia)   DM type 2 (diabetes mellitus, type 2) (HCC)   Parkinson disease  Acute unstable angina (HCC) -Patient loaded with 81 mg aspirin -Continue nitro, morphine per protocol, oxygen as appropriate -Continue heparin drip, metoprolol, atorvastatin, Plavix  -Cardiology consulted, continue telemetry -Echocardiogram pending -Further workup per cardiology -tentative plan for cardiac catheterization Monday 01/03/2023    Hypertension, well-controlled -Continue metoprolol, hold losartan -PRN hydralazine   Heart failure, grade 1 diastolic dysfunction, not in acute exacerbation -Most recent echo November 2023 preserved EF -Continue metoprolol, ARB held  CAD (coronary artery disease), autologous vein bypass graft - Continue Toprol-XL 50 mg daily, Lipitor 80 mg daily and aspirin. - Continue tele   Parkinson disease - Appears to be at baseline per niece at bedside - Carbidopa levodopa 50/200 mg at bedtime.   DM type 2 (diabetes mellitus,  type 2) (HCC), well-controlled -Continue diabetic diet -A1c 6.4. -Continue sliding scale, hypoglycemic protocol   HLD (hyperlipidemia) - Continue Lipitor 80 mg daily  DVT prophylaxis: SCDs Start: 01/01/23 0141 Code Status:   Code Status: Full Code Family Communication: Niece at bedside - wife admitted to hospital recently  Status is: Inpt  Dispo: The patient is from: Home              Anticipated d/c is to: Home              Anticipated d/c date is: 24-48h              Patient currently NOT medically stable for discharge, awaiting cardiac evaluation and catheterization  Consultants:  Cardiology  Procedures:  Cardiac cath planned 6/24  Antimicrobials:  None  Subjective: No acute issues or events overnight, chest pain resolving. Denies symptoms with exertion today.  Otherwise denies nausea vomiting diarrhea constipation headache fevers chills shortness of breath.  Objective: Vitals:   01/01/23 1423 01/01/23 1926 01/02/23 0500 01/02/23 0535  BP: 132/75 133/68 139/84 134/68  Pulse:  69 63   Resp: 18 16 17    Temp: 97.9 F (36.6 C) 98 F (36.7 C) 97.7 F (36.5 C)   TempSrc: Oral Oral Oral   SpO2:  96% 96%   Weight:   85 kg   Height:        Intake/Output Summary (Last 24 hours) at 01/02/2023 0735 Last data filed at 01/01/2023 2200 Gross per 24 hour  Intake 1710.07 ml  Output 200 ml  Net 1510.07 ml    Filed Weights   12/31/22 2211 01/01/23 0122 01/02/23 0500  Weight: 86.2 kg 84.2 kg 85 kg    Examination: General:  Pleasantly resting in bed, No acute distress. HEENT:  Normocephalic atraumatic.  Sclerae nonicteric, noninjected.  Extraocular movements intact bilaterally. Neck:  Without mass or deformity.  Trachea is midline. Lungs:  Clear to auscultate bilaterally without rhonchi, wheeze, or rales. Heart: Holosystolic murmur PMI left sternal border without rubs or gallops. Abdomen:  Soft, nontender, nondistended.  Without guarding or rebound. Extremities: Without  cyanosis, clubbing, edema, or obvious deformity. Skin:  Warm and dry, no erythema  Data Reviewed: I have personally reviewed following labs and imaging studies  CBC: Recent Labs  Lab 12/31/22 2141 01/01/23 0804  WBC 8.2 7.3  HGB 13.9 13.1  HCT 42.0 39.5  MCV 91.3 92.1  PLT 224 170    Basic Metabolic Panel: Recent Labs  Lab 12/31/22 2141 01/01/23 0804  NA 138 139  K 3.9 4.3  CL 102 105  CO2 30 26  GLUCOSE 145* 102*  BUN 17 15  CREATININE 0.85 0.74  CALCIUM 9.7 8.7*    GFR: Estimated Creatinine Clearance: 78.6 mL/min (by C-G formula based on SCr of 0.74 mg/dL).  Coagulation Profile: Recent Labs  Lab 12/31/22 2141 01/01/23 0804  INR 1.9* 2.0*    No results found for this or any previous visit (from the past 240 hour(s)).   Radiology Studies: ECHOCARDIOGRAM COMPLETE  Result Date: 01/01/2023    ECHOCARDIOGRAM REPORT   Patient Name:   [name] [facility] Date of Exam: 01/01/2023 Medical Rec #:  [id number]       Height:       66.5 in Accession #:    [id number]      Weight:       185.6 lb Date of Birth:  1945/11/22      BSA:          1.948 m Patient Age:    23 years        BP:           130/81 mmHg Patient Gender: M               HR:           74 bpm. Exam Location:  Inpatient Procedure: 2D Echo, Cardiac Doppler, Color Doppler and Intracardiac            Opacification Agent Indications:    Chest Pain R07.9  History:        Patient has prior history of Echocardiogram examinations, most                 recent 05/26/2022. CAD and Previous Myocardial Infarction,                 Pacemaker and Prior CABG, Carotid Disease, Aortic Valve Disease;                 Risk Factors:Diabetes, Hypertension and Former Smoker.                 Aortic Valve: St. Jude mechanical valve is present in the aortic                 position. Procedure Date: 2005.  Sonographer:    Dondra Prader RVT RCS Referring Phys: [phone] [facility]  Sonographer Comments: Suboptimal apical window. IMPRESSIONS  1. Left  ventricular ejection fraction, by estimation, is 50 to 55%. The left ventricle has low normal function. The left ventricle demonstrates regional wall motion abnormalities (see scoring diagram/findings for description). The left ventricular internal cavity size was mildly dilated. Left ventricular diastolic parameters are indeterminate. There is mild hypokinesis of the left ventricular, mid-apical inferior  wall. There is hypokinesis of the left ventricular, apical septal wall.  2. Right ventricular systolic function is normal. The right ventricular size is normal.  3. Right atrial size was moderately dilated.  4. The mitral valve is grossly normal. Mild mitral valve regurgitation. No evidence of mitral stenosis.  5. The aortic valve has been repaired/replaced. Aortic valve regurgitation is trivial. There is a St. Jude mechanical valve present in the aortic position. Procedure Date: 2005.  6. Aortic root/ascending aorta has been repaired/replaced.  7. The inferior vena cava is normal in size with greater than 50% respiratory variability, suggesting right atrial pressure of 3 mmHg. Comparison(s): No significant change from prior study. Conclusion(s)/Recommendation(s): No left ventricular mural or apical thrombus/thrombi. FINDINGS  Left Ventricle: Left ventricular ejection fraction, by estimation, is 50 to 55%. The left ventricle has low normal function. The left ventricle demonstrates regional wall motion abnormalities. Mild hypokinesis of the left ventricular, mid-apical inferior wall. Definity contrast agent was given IV to delineate the left ventricular endocardial borders. The left ventricular internal cavity size was mildly dilated. There is borderline left ventricular hypertrophy. Abnormal (paradoxical) septal motion, consistent with RV pacemaker. Left ventricular diastolic parameters are indeterminate. Right Ventricle: The right ventricular size is normal. No increase in right ventricular wall thickness. Right  ventricular systolic function is normal. Left Atrium: Left atrial size was normal in size. Right Atrium: Right atrial size was moderately dilated. Pericardium: There is no evidence of pericardial effusion. Mitral Valve: The mitral valve is grossly normal. Mild mitral valve regurgitation. No evidence of mitral valve stenosis. Tricuspid Valve: The tricuspid valve is normal in structure. Tricuspid valve regurgitation is trivial. No evidence of tricuspid stenosis. Aortic Valve: The aortic valve has been repaired/replaced. Aortic valve regurgitation is trivial. Aortic valve mean gradient measures 5.0 mmHg. Aortic valve peak gradient measures 9.2 mmHg. Aortic valve area, by VTI measures 2.54 cm. There is a St. Jude  mechanical valve present in the aortic position. Procedure Date: 2005. Pulmonic Valve: The pulmonic valve was not well visualized. Pulmonic valve regurgitation is not visualized. No evidence of pulmonic stenosis. Aorta: The aortic root/ascending aorta has been repaired/replaced. Venous: The inferior vena cava is normal in size with greater than 50% respiratory variability, suggesting right atrial pressure of 3 mmHg. IAS/Shunts: The atrial septum is grossly normal. Additional Comments: A device lead is visualized in the right ventricle.  LEFT VENTRICLE PLAX 2D LVIDd:         5.30 cm   Diastology LVIDs:         3.80 cm   LV e' medial:    8.38 cm/s LV PW:         0.90 cm   LV E/e' medial:  10.1 LV IVS:        1.10 cm   LV e' lateral:   6.42 cm/s LVOT diam:     1.80 cm   LV E/e' lateral: 13.1 LV SV:         79 LV SV Index:   41 LVOT Area:     2.54 cm  RIGHT VENTRICLE            IVC RV Basal diam:  3.50 cm    IVC diam: 1.50 cm RV S prime:     8.40 cm/s TAPSE (M-mode): 2.0 cm LEFT ATRIUM           Index        RIGHT ATRIUM           Index LA  diam:      4.00 cm 2.05 cm/m   RA Area:     23.00 cm LA Vol (A4C): 33.2 ml 17.07 ml/m  RA Volume:   77.50 ml  39.78 ml/m  AORTIC VALVE                     PULMONIC VALVE  AV Area (Vmax):    2.40 cm      PV Vmax:       0.92 m/s AV Area (Vmean):   2.33 cm      PV Peak grad:  3.4 mmHg AV Area (VTI):     2.54 cm AV Vmax:           151.67 cm/s AV Vmean:          101.967 cm/s AV VTI:            0.313 m AV Peak Grad:      9.2 mmHg AV Mean Grad:      5.0 mmHg LVOT Vmax:         143.00 cm/s LVOT Vmean:        93.300 cm/s LVOT VTI:          0.312 m LVOT/AV VTI ratio: 1.00  AORTA Ao Root diam: 3.20 cm Ao Asc diam:  3.80 cm MITRAL VALVE MV Area (PHT): 3.34 cm    SHUNTS MV Decel Time: 227 msec    Systemic VTI:  0.31 m MV E velocity: 84.40 cm/s  Systemic Diam: 1.80 cm MV A velocity: 97.10 cm/s MV E/A ratio:  0.87 Jodelle Red MD Electronically signed by Jodelle Red MD Signature Date/Time: 01/01/2023/2:52:48 PM    Final    DG Chest Port 1 View  Result Date: 12/31/2022 CLINICAL DATA:  Chest pain. EXAM: PORTABLE CHEST 1 VIEW COMPARISON:  May 25, 2022 FINDINGS: There is stable dual lead AICD positioning. Multiple sternal wires and vascular clips are noted. The heart size and mediastinal contours are within normal limits. An artificial aortic valve is seen. Mild, chronic appearing increased lung markings are seen without evidence of an acute infiltrate, pleural effusion or pneumothorax. A radiopaque surgical screw is seen overlying the left humeral head. Multilevel degenerative changes are seen throughout the thoracic spine. IMPRESSION: 1. Evidence of prior median sternotomy/CABG and artificial aortic valve placement. 2. No acute or active cardiopulmonary disease. Electronically Signed   By: Aram Candela M.D.   On: 12/31/2022 22:29    Scheduled Meds:  aspirin  81 mg Oral Daily   atorvastatin  80 mg Oral Daily   carbidopa-levodopa  1 tablet Oral QHS   clopidogrel  75 mg Oral Daily   melatonin  5 mg Oral QHS   metoprolol succinate  50 mg Oral Daily   Continuous Infusions:  heparin 1,100 Units/hr (01/01/23 2153)     LOS: 1 day   Time spent:   Azucena Fallen, DO [facility]  If 7PM-7AM, please contact night-coverage [email or web address]  01/02/2023, 7:35 AM

## 2023-01-02 NOTE — Progress Notes (Signed)
Pt reported chest pain 8.5/10.  EKG obtained.  Relief with 2 SL NTG.  BP 134/68.  Will continue to monitor closely.

## 2023-01-02 NOTE — Progress Notes (Signed)
Pt c/o chest pain 8.5/10. Nitroglycerin given, with relief, EKG obtained. Reports no pain at this time.

## 2023-01-02 NOTE — Progress Notes (Signed)
ANTICOAGULATION CONSULT NOTE  Pharmacy Consult for heparin  Indication: chest pain/ACS  Allergies  Allergen Reactions   Bee Venom Shortness Of Breath and Swelling   Codeine Phosphate Hives, Shortness Of Breath, Itching, Rash and Other (See Comments)    Tolerated multiple doses of IV morphine   Metoprolol Tartrate Other (See Comments)    loss of vision. Can take the XL tablet not regular    Pramipexole Other (See Comments)    Hallucinations     Tramadol     Pt has hallucinations    Ramipril Rash and Cough    Patient Measurements: Height: 5' 6.5" (168.9 cm) Weight: 85 kg (187 lb 4.8 oz) IBW/kg (Calculated) : 64.95 Heparin Dosing Weight: 82.7kg   Vital Signs: Temp: 97.7 F (36.5 C) (06/23 0500) Temp Source: Oral (06/23 0500) BP: 134/68 (06/23 0535) Pulse Rate: 63 (06/23 0500)  Labs: Recent Labs    12/31/22 2141 12/31/22 2339 01/01/23 0804 01/01/23 1728 01/02/23 0658  HGB 13.9  --  13.1  --  12.3*  HCT 42.0  --  39.5  --  37.0*  PLT 224  --  170  --  171  APTT  --   --  53*  --   --   LABPROT 21.7*  --  22.9*  --   --   INR 1.9*  --  2.0*  --   --   HEPARINUNFRC  --   --  0.16* 0.38 0.46  CREATININE 0.85  --  0.74  --  0.80  TROPONINIHS 17 20*  --   --   --      Estimated Creatinine Clearance: 78.6 mL/min (by C-G formula based on SCr of 0.8 mg/dL).   Medical History: Past Medical History:  Diagnosis Date   Acute renal failure (HCC) 02/09/2013   Aortic stenosis 08/25/2010   a. [place] aortic root replacement with a St. Jude mechanical valve and Hemashield conduit 02/2004.   Arthritis    Asthma    AV BLOCK, COMPLETE    a. s/p St Jude dual chamber pacemaker 02/2004.   CAD (coronary artery disease)    a. s/p CABGx2 (LIMA-dLAD, SVG-Cx). b. Low risk nuc 12/2012 without ischemia, EF 46% mild apical hypokinesia (EF 55% inf HK by echo).   Carotid artery disease (HCC)    a. 0-39% bilateral ICA stenosis, stable mild hard plaque in carotid bulbs. F/u 03/2014 recommended.    Diabetes mellitus without complication (HCC)    type 2    DIVERTICULOSIS, COLON 10/17/2007   Ejection fraction    a. EF 55% with inf HK, mild MR by echo 12/2012.   GERD (gastroesophageal reflux disease)    hxof    GOUT 01/16/2007   Headache    hx of migraines    HEMORRHOIDS, INTERNAL 11/01/2008   Hx of adenomatous polyp of colon 02/2019   no recall needed   HYPERLIPIDEMIA 01/16/2007   HYPERTENSION 01/16/2007   Impaired glucose tolerance    LEG EDEMA 11/27/2008   Special screening for malignant neoplasm of prostate    Warfarin anticoagulation    AVR    Assessment: Patient admitted with CC of chest pain. Patient with extensive cardiac history on warfarin for mechanical aortic valve. Last dose 12/31/22 at 20:15.   INR 2> 1.1. Heparin level therapeutic at 0.46 on heparin drip rate 1100 uts/hr. Hgb 12.3 & plt wnl.  No issues with infusion or bleeding noted. Cath planned for tomorrow, 6/24.   Goal of Therapy:  INR goal 2-3  per outpatient notes  Heparin level 0.3-0.7 units/ml Monitor platelets by anticoagulation protocol: Yes   Plan:  Continue heparin to 1100 unit/hr  Check heparin level and CBC daily Monitor for s/sx of bleeding  F/u warfarin resumption once procedures complete   Andreas Ohm, PharmD Pharmacy Resident  01/02/2023 8:54 AM

## 2023-01-02 NOTE — Progress Notes (Addendum)
Rounding Note    Patient Name: [name] Date of Encounter: 01/02/2023  [facility] Cardiologist: Donato Schultz, MD   Subjective   Had 8.5/10 chest pain this AM, relieved with 2 nitroglycerin. He notes this was when he was sat up to use the bathroom. No other chest pain.   Inpatient Medications    Scheduled Meds:  aspirin  81 mg Oral Daily   atorvastatin  80 mg Oral Daily   carbidopa-levodopa  1 tablet Oral QHS   clopidogrel  75 mg Oral Daily   melatonin  5 mg Oral QHS   metoprolol succinate  50 mg Oral Daily   Continuous Infusions:  heparin 1,100 Units/hr (01/01/23 2153)   PRN Meds: acetaminophen **OR** acetaminophen, albuterol, hydrALAZINE, morphine injection, nitroGLYCERIN, ondansetron **OR** ondansetron (ZOFRAN) IV, mouth rinse   Vital Signs    Vitals:   01/01/23 1423 01/01/23 1926 01/02/23 0500 01/02/23 0535  BP: 132/75 133/68 139/84 134/68  Pulse:  69 63   Resp: 18 16 17    Temp: 97.9 F (36.6 C) 98 F (36.7 C) 97.7 F (36.5 C)   TempSrc: Oral Oral Oral   SpO2:  96% 96%   Weight:   85 kg   Height:        Intake/Output Summary (Last 24 hours) at 01/02/2023 0908 Last data filed at 01/02/2023 0700 Gross per 24 hour  Intake 1808.99 ml  Output 200 ml  Net 1608.99 ml      01/02/2023    5:00 AM 01/01/2023    1:22 AM 12/31/2022   10:11 PM  Last 3 Weights  Weight (lbs) 187 lb 4.8 oz 185 lb 9.6 oz 190 lb 0.6 oz  Weight (kg) 84.959 kg 84.188 kg 86.2 kg      Telemetry    V paced, with intermittent a pacing and a sensing - Personally Reviewed  ECG    V paced, with intermittent a pacing and a sensing - Personally Reviewed  Physical Exam   GEN: Well nourished, well developed in no acute distress NECK: No JVD CARDIAC: regular rhythm, normal S1 and S2, no rubs or gallops. No murmur. Crisp mechanical valve click VASCULAR: Radial pulses 2+ bilaterally.  RESPIRATORY:  Clear to auscultation without rales, wheezing or rhonchi  ABDOMEN: Soft,  non-tender, non-distended MUSCULOSKELETAL:  Moves all 4 limbs independently SKIN: Warm and dry, no edema NEUROLOGIC:  No focal neuro deficits noted. PSYCHIATRIC:  Normal affect    Labs    High Sensitivity Troponin:   Recent Labs  Lab 12/31/22 2141 12/31/22 2339  TROPONINIHS 17 20*     Chemistry Recent Labs  Lab 12/31/22 2141 01/01/23 0804 01/02/23 0658  NA 138 139 137  K 3.9 4.3 3.6  CL 102 105 105  CO2 30 26 24   GLUCOSE 145* 102* 94  BUN 17 15 13   CREATININE 0.85 0.74 0.80  CALCIUM 9.7 8.7* 8.5*  PROT  --  5.4*  --   ALBUMIN  --  3.4*  --   AST  --  33  --   ALT  --  22  --   ALKPHOS  --  38  --   BILITOT  --  1.2  --   GFRNONAA >60 >60 >60  ANIONGAP 6 8 8     Lipids No results for input(s): "CHOL", "TRIG", "HDL", "LABVLDL", "LDLCALC", "CHOLHDL" in the last 168 hours.  Hematology Recent Labs  Lab 12/31/22 2141 01/01/23 0804 01/02/23 0658  WBC 8.2 7.3 8.4  RBC 4.60  4.29 4.07*  HGB 13.9 13.1 12.3*  HCT 42.0 39.5 37.0*  MCV 91.3 92.1 90.9  MCH 30.2 30.5 30.2  MCHC 33.1 33.2 33.2  RDW 15.0 15.0 14.9  PLT 224 170 171   Thyroid No results for input(s): "TSH", "FREET4" in the last 168 hours.  BNPNo results for input(s): "BNP", "PROBNP" in the last 168 hours.  DDimer No results for input(s): "DDIMER" in the last 168 hours.   Radiology    ECHOCARDIOGRAM COMPLETE  Result Date: 01/01/2023    ECHOCARDIOGRAM REPORT   Patient Name:   [name] [facility] Date of Exam: 01/01/2023 Medical Rec #:  [id number]       Height:       66.5 in Accession #:    [id number]      Weight:       185.6 lb Date of Birth:  1945/12/09      BSA:          1.948 m Patient Age:    77 years        BP:           130/81 mmHg Patient Gender: M               HR:           74 bpm. Exam Location:  Inpatient Procedure: 2D Echo, Cardiac Doppler, Color Doppler and Intracardiac            Opacification Agent Indications:    Chest Pain R07.9  History:        Patient has prior history of Echocardiogram  examinations, most                 recent 05/26/2022. CAD and Previous Myocardial Infarction,                 Pacemaker and Prior CABG, Carotid Disease, Aortic Valve Disease;                 Risk Factors:Diabetes, Hypertension and Former Smoker.                 Aortic Valve: St. Jude mechanical valve is present in the aortic                 position. Procedure Date: 2005.  Sonographer:    Dondra Prader RVT RCS Referring Phys: [phone] [facility]  Sonographer Comments: Suboptimal apical window. IMPRESSIONS  1. Left ventricular ejection fraction, by estimation, is 50 to 55%. The left ventricle has low normal function. The left ventricle demonstrates regional wall motion abnormalities (see scoring diagram/findings for description). The left ventricular internal cavity size was mildly dilated. Left ventricular diastolic parameters are indeterminate. There is mild hypokinesis of the left ventricular, mid-apical inferior wall. There is hypokinesis of the left ventricular, apical septal wall.  2. Right ventricular systolic function is normal. The right ventricular size is normal.  3. Right atrial size was moderately dilated.  4. The mitral valve is grossly normal. Mild mitral valve regurgitation. No evidence of mitral stenosis.  5. The aortic valve has been repaired/replaced. Aortic valve regurgitation is trivial. There is a St. Jude mechanical valve present in the aortic position. Procedure Date: 2005.  6. Aortic root/ascending aorta has been repaired/replaced.  7. The inferior vena cava is normal in size with greater than 50% respiratory variability, suggesting right atrial pressure of 3 mmHg. Comparison(s): No significant change from prior study. Conclusion(s)/Recommendation(s): No left ventricular mural or apical thrombus/thrombi. FINDINGS  Left Ventricle: Left ventricular  ejection fraction, by estimation, is 50 to 55%. The left ventricle has low normal function. The left ventricle demonstrates regional wall motion  abnormalities. Mild hypokinesis of the left ventricular, mid-apical inferior wall. Definity contrast agent was given IV to delineate the left ventricular endocardial borders. The left ventricular internal cavity size was mildly dilated. There is borderline left ventricular hypertrophy. Abnormal (paradoxical) septal motion, consistent with RV pacemaker. Left ventricular diastolic parameters are indeterminate. Right Ventricle: The right ventricular size is normal. No increase in right ventricular wall thickness. Right ventricular systolic function is normal. Left Atrium: Left atrial size was normal in size. Right Atrium: Right atrial size was moderately dilated. Pericardium: There is no evidence of pericardial effusion. Mitral Valve: The mitral valve is grossly normal. Mild mitral valve regurgitation. No evidence of mitral valve stenosis. Tricuspid Valve: The tricuspid valve is normal in structure. Tricuspid valve regurgitation is trivial. No evidence of tricuspid stenosis. Aortic Valve: The aortic valve has been repaired/replaced. Aortic valve regurgitation is trivial. Aortic valve mean gradient measures 5.0 mmHg. Aortic valve peak gradient measures 9.2 mmHg. Aortic valve area, by VTI measures 2.54 cm. There is a St. Jude  mechanical valve present in the aortic position. Procedure Date: 2005. Pulmonic Valve: The pulmonic valve was not well visualized. Pulmonic valve regurgitation is not visualized. No evidence of pulmonic stenosis. Aorta: The aortic root/ascending aorta has been repaired/replaced. Venous: The inferior vena cava is normal in size with greater than 50% respiratory variability, suggesting right atrial pressure of 3 mmHg. IAS/Shunts: The atrial septum is grossly normal. Additional Comments: A device lead is visualized in the right ventricle.  LEFT VENTRICLE PLAX 2D LVIDd:         5.30 cm   Diastology LVIDs:         3.80 cm   LV e' medial:    8.38 cm/s LV PW:         0.90 cm   LV E/e' medial:  10.1 LV  IVS:        1.10 cm   LV e' lateral:   6.42 cm/s LVOT diam:     1.80 cm   LV E/e' lateral: 13.1 LV SV:         79 LV SV Index:   41 LVOT Area:     2.54 cm  RIGHT VENTRICLE            IVC RV Basal diam:  3.50 cm    IVC diam: 1.50 cm RV S prime:     8.40 cm/s TAPSE (M-mode): 2.0 cm LEFT ATRIUM           Index        RIGHT ATRIUM           Index LA diam:      4.00 cm 2.05 cm/m   RA Area:     23.00 cm LA Vol (A4C): 33.2 ml 17.07 ml/m  RA Volume:   77.50 ml  39.78 ml/m  AORTIC VALVE                     PULMONIC VALVE AV Area (Vmax):    2.40 cm      PV Vmax:       0.92 m/s AV Area (Vmean):   2.33 cm      PV Peak grad:  3.4 mmHg AV Area (VTI):     2.54 cm AV Vmax:           151.67 cm/s AV Vmean:  101.967 cm/s AV VTI:            0.313 m AV Peak Grad:      9.2 mmHg AV Mean Grad:      5.0 mmHg LVOT Vmax:         143.00 cm/s LVOT Vmean:        93.300 cm/s LVOT VTI:          0.312 m LVOT/AV VTI ratio: 1.00  AORTA Ao Root diam: 3.20 cm Ao Asc diam:  3.80 cm MITRAL VALVE MV Area (PHT): 3.34 cm    SHUNTS MV Decel Time: 227 msec    Systemic VTI:  0.31 m MV E velocity: 84.40 cm/s  Systemic Diam: 1.80 cm MV A velocity: 97.10 cm/s MV E/A ratio:  0.87 Jodelle Red MD Electronically signed by Jodelle Red MD Signature Date/Time: 01/01/2023/2:52:48 PM    Final    DG Chest Port 1 View  Result Date: 12/31/2022 CLINICAL DATA:  Chest pain. EXAM: PORTABLE CHEST 1 VIEW COMPARISON:  May 25, 2022 FINDINGS: There is stable dual lead AICD positioning. Multiple sternal wires and vascular clips are noted. The heart size and mediastinal contours are within normal limits. An artificial aortic valve is seen. Mild, chronic appearing increased lung markings are seen without evidence of an acute infiltrate, pleural effusion or pneumothorax. A radiopaque surgical screw is seen overlying the left humeral head. Multilevel degenerative changes are seen throughout the thoracic spine. IMPRESSION: 1. Evidence of prior  median sternotomy/CABG and artificial aortic valve placement. 2. No acute or active cardiopulmonary disease. Electronically Signed   By: Aram Candela M.D.   On: 12/31/2022 22:29    Cardiac Studies   Echo as above  Patient Profile     77 y.o. male with PMH CAD with prior CABG and PCI, history of Bentall with mechanical AVR admitted for chest pain   Assessment & Plan    Chest pain CAD with prior CABG and PCI -continue aspirin, clopidogrel, statin, heparin -echo as above, EF 50-55% with stable rWMA -cath 6/24 planned  History of Bentall with mechanical AVR -on heparin -INR today pending  Permanent pacemaker -v paced, with both a pacing and a sensing seen  For questions or updates, please contact [facility] Please consult [email or web address] for contact info under        Signed, Jodelle Red, MD  01/02/2023, 9:08 AM

## 2023-01-03 ENCOUNTER — Inpatient Hospital Stay (HOSPITAL_COMMUNITY): Payer: Medicare HMO

## 2023-01-03 ENCOUNTER — Encounter (HOSPITAL_COMMUNITY)
Admission: EM | Disposition: A | Discharge status: Home / Self Care | Payer: Self-pay | Source: Home / Self Care | Attending: Internal Medicine

## 2023-01-03 DIAGNOSIS — I200 Unstable angina: Secondary | ICD-10-CM | POA: Diagnosis not present

## 2023-01-03 DIAGNOSIS — I2510 Atherosclerotic heart disease of native coronary artery without angina pectoris: Secondary | ICD-10-CM

## 2023-01-03 HISTORY — PX: CORONARY STENT INTERVENTION: CATH118234

## 2023-01-03 HISTORY — PX: LEFT HEART CATH AND CORS/GRAFTS ANGIOGRAPHY: CATH118250

## 2023-01-03 LAB — POCT ACTIVATED CLOTTING TIME
Activated Clotting Time: 250 seconds
Activated Clotting Time: 385 seconds
Activated Clotting Time: 177 seconds
Activated Clotting Time: 159 seconds

## 2023-01-03 LAB — CBC
HCT: 36.9 % — ABNORMAL LOW (ref 39.0–52.0)
HCT: 42.3 % (ref 39.0–52.0)
Hemoglobin: 12.2 g/dL — ABNORMAL LOW (ref 13.0–17.0)
Hemoglobin: 13.9 g/dL (ref 13.0–17.0)
MCH: 30 pg (ref 26.0–34.0)
MCH: 30.9 pg (ref 26.0–34.0)
MCHC: 33.1 g/dL (ref 30.0–36.0)
MCHC: 32.9 g/dL (ref 30.0–36.0)
MCV: 90.7 fL (ref 80.0–100.0)
MCV: 94 fL (ref 80.0–100.0)
Platelets: 183 10*3/uL (ref 150–400)
Platelets: 188 10*3/uL (ref 150–400)
RBC: 4.07 MIL/uL — ABNORMAL LOW (ref 4.22–5.81)
RBC: 4.5 MIL/uL (ref 4.22–5.81)
RDW: 14.9 % (ref 11.5–15.5)
RDW: 14.7 % (ref 11.5–15.5)
WBC: 7.5 10*3/uL (ref 4.0–10.5)
WBC: 17.5 10*3/uL — ABNORMAL HIGH (ref 4.0–10.5)
nRBC: 0 % (ref 0.0–0.2)
nRBC: 0 % (ref 0.0–0.2)

## 2023-01-03 LAB — BASIC METABOLIC PANEL
Anion gap: 6 (ref 5–15)
BUN: 14 mg/dL (ref 8–23)
CO2: 25 mmol/L (ref 22–32)
Calcium: 8.5 mg/dL — ABNORMAL LOW (ref 8.9–10.3)
Chloride: 104 mmol/L (ref 98–111)
Creatinine, Ser: 0.75 mg/dL (ref 0.61–1.24)
GFR, Estimated: >60 mL/min (ref >60)
Glucose, Bld: 101 mg/dL — ABNORMAL HIGH (ref 70–99)
Potassium: 3.8 mmol/L (ref 3.5–5.1)
Sodium: 135 mmol/L (ref 135–145)

## 2023-01-03 LAB — BRAIN NATRIURETIC PEPTIDE: B Natriuretic Peptide: 210.8 pg/mL — ABNORMAL HIGH (ref 0.0–100.0)

## 2023-01-03 LAB — CREATININE, SERUM
Creatinine, Ser: 0.88 mg/dL (ref 0.61–1.24)
GFR, Estimated: >60 mL/min (ref >60)

## 2023-01-03 LAB — HEPARIN LEVEL (UNFRACTIONATED): Heparin Unfractionated: 0.5 IU/mL (ref 0.30–0.70)

## 2023-01-03 LAB — TROPONIN I (HIGH SENSITIVITY)
Troponin I (High Sensitivity): 776 ng/L (ref <18)
Troponin I (High Sensitivity): 3684 ng/L (ref <18)

## 2023-01-03 SURGERY — LEFT HEART CATH AND CORS/GRAFTS ANGIOGRAPHY
Anesthesia: LOCAL

## 2023-01-03 MED ORDER — HEPARIN SODIUM (PORCINE) 1000 UNIT/ML IJ SOLN
INTRAMUSCULAR | Status: DC | PRN
  Administered 2023-01-03: 9000 [IU] via INTRAVENOUS

## 2023-01-03 MED ORDER — HEPARIN SODIUM (PORCINE) 5000 UNIT/ML IJ SOLN: 5000.0000 [IU] | Freq: Three times a day (TID) | INTRAMUSCULAR | Status: DC

## 2023-01-03 MED ORDER — HEPARIN SODIUM (PORCINE) 1000 UNIT/ML IJ SOLN
INTRAMUSCULAR | Status: AC
  Filled 2023-01-03: qty 10

## 2023-01-03 MED ORDER — MIDAZOLAM HCL 2 MG/2ML IJ SOLN
INTRAMUSCULAR | Status: DC | PRN
  Administered 2023-01-03: 1 mg via INTRAVENOUS

## 2023-01-03 MED ORDER — HEPARIN (PORCINE) IN NACL 1000-0.9 UT/500ML-% IV SOLN
INTRAVENOUS | Status: DC | PRN
  Administered 2023-01-03 (×3): 500 mL

## 2023-01-03 MED ORDER — SODIUM CHLORIDE 0.9% FLUSH: 3.0000 mL | INTRAVENOUS | Status: DC | PRN

## 2023-01-03 MED ORDER — GUAIFENESIN ER 600 MG PO TB12
600.0000 mg | ORAL_TABLET | Freq: Two times a day (BID) | ORAL | Status: DC
  Administered 2023-01-03 – 2023-01-06 (×6): 600 mg via ORAL
  Filled 2023-01-03 (×6): qty 1

## 2023-01-03 MED ORDER — SODIUM CHLORIDE 0.9% FLUSH
3.0000 mL | Freq: Two times a day (BID) | INTRAVENOUS | Status: DC
  Administered 2023-01-04 – 2023-01-06 (×3): 3 mL via INTRAVENOUS

## 2023-01-03 MED ORDER — LIDOCAINE HCL (PF) 1 % IJ SOLN
INTRAMUSCULAR | Status: AC
  Filled 2023-01-03: qty 30

## 2023-01-03 MED ORDER — HEPARIN (PORCINE) 25000 UT/250ML-% IV SOLN
1200.0000 [IU]/h | INTRAVENOUS | Status: DC
  Administered 2023-01-04: 1200 [IU]/h via INTRAVENOUS
  Filled 2023-01-03: qty 250

## 2023-01-03 MED ORDER — SODIUM CHLORIDE 0.9 % IV SOLN: 250.0000 mL | INTRAVENOUS | Status: DC | PRN

## 2023-01-03 MED ORDER — NITROGLYCERIN 1 MG/10 ML FOR IR/CATH LAB
INTRA_ARTERIAL | Status: AC
  Filled 2023-01-03: qty 10

## 2023-01-03 MED ORDER — ACETAMINOPHEN 325 MG PO TABS
650.0000 mg | ORAL_TABLET | Freq: Once | ORAL | Status: AC
  Administered 2023-01-03: 650 mg via ORAL
  Filled 2023-01-03: qty 2

## 2023-01-03 MED ORDER — FUROSEMIDE 10 MG/ML IJ SOLN
40.0000 mg | Freq: Once | INTRAMUSCULAR | Status: AC
  Administered 2023-01-03: 40 mg via INTRAVENOUS
  Filled 2023-01-03: qty 4

## 2023-01-03 MED ORDER — FENTANYL CITRATE (PF) 100 MCG/2ML IJ SOLN
INTRAMUSCULAR | Status: DC | PRN
  Administered 2023-01-03: 50 ug via INTRAVENOUS
  Administered 2023-01-03: 25 ug via INTRAVENOUS

## 2023-01-03 MED ORDER — VERAPAMIL HCL 2.5 MG/ML IV SOLN
INTRAVENOUS | Status: AC
  Filled 2023-01-03: qty 2

## 2023-01-03 MED ORDER — MIDAZOLAM HCL 2 MG/2ML IJ SOLN
INTRAMUSCULAR | Status: AC
  Filled 2023-01-03: qty 2

## 2023-01-03 MED ORDER — SODIUM CHLORIDE 0.9 % WEIGHT BASED INFUSION: 1.0000 mL/kg/h | INTRAVENOUS | Status: DC

## 2023-01-03 MED ORDER — BENZONATATE 100 MG PO CAPS
100.0000 mg | ORAL_CAPSULE | Freq: Two times a day (BID) | ORAL | Status: DC
  Administered 2023-01-03 – 2023-01-06 (×6): 100 mg via ORAL
  Filled 2023-01-03 (×6): qty 1

## 2023-01-03 MED ORDER — LIDOCAINE HCL (PF) 1 % IJ SOLN
INTRAMUSCULAR | Status: DC | PRN
  Administered 2023-01-03: 15 mL

## 2023-01-03 MED ORDER — IOHEXOL 350 MG/ML SOLN
INTRAVENOUS | Status: DC | PRN
  Administered 2023-01-03: 100 mL

## 2023-01-03 MED ORDER — FENTANYL CITRATE (PF) 100 MCG/2ML IJ SOLN
INTRAMUSCULAR | Status: AC
  Filled 2023-01-03: qty 2

## 2023-01-03 SURGICAL SUPPLY — 26 items
BALL SAPPHIRE NC24 2.50X12 (BALLOONS) ×1
BALL SAPPHIRE NC24 3.0X15 (BALLOONS) ×1
BALL SAPPHIRE NC24 3.25X22 (BALLOONS) ×1
BALLN SAPPHIRE 2.5X15 (BALLOONS) ×1
BALLOON SAPPHIRE 2.5X15 (BALLOONS) IMPLANT
BALLOON SAPPHIRE NC24 2.50X12 (BALLOONS) IMPLANT
BALLOON SAPPHIRE NC24 3.0X15 (BALLOONS) IMPLANT
BALLOON SAPPHIRE NC24 3.25X22 (BALLOONS) IMPLANT
CATH INFINITI 5 FR IM (CATHETERS) IMPLANT
CATH INFINITI 5FR MULTPACK ANG (CATHETERS) IMPLANT
CATH LAUNCHER 6FR EBU 4 SH (CATHETERS) IMPLANT
ELECT DEFIB PAD ADLT CADENCE (PAD) IMPLANT
GUIDEWIRE TIGER .035X300 (WIRE) IMPLANT
KIT ENCORE 26 ADVANTAGE (KITS) IMPLANT
KIT HEART LEFT (KITS) ×1 IMPLANT
KIT MICROPUNCTURE NIT STIFF (SHEATH) IMPLANT
PACK CARDIAC CATHETERIZATION (CUSTOM PROCEDURE TRAY) ×1 IMPLANT
SHEATH PINNACLE 5F 10CM (SHEATH) IMPLANT
SHEATH PINNACLE 6F 10CM (SHEATH) IMPLANT
STENT SYNERGY XD 3.0X28 (Permanent Stent) IMPLANT
SYNERGY XD 3.0X28 (Permanent Stent) ×1 IMPLANT
TRANSDUCER W/STOPCOCK (MISCELLANEOUS) ×1 IMPLANT
TUBING CIL FLEX 10 FLL-RA (TUBING) ×1 IMPLANT
WIRE ASAHI PROWATER 180CM (WIRE) IMPLANT
WIRE EMERALD 3MM-J .035X150CM (WIRE) IMPLANT
WIRE RUNTHROUGH IZANAI 014 180 (WIRE) IMPLANT

## 2023-01-03 NOTE — Progress Notes (Signed)
ANTICOAGULATION CONSULT NOTE  Pharmacy Consult for heparin  Indication: chest pain/ACS  Allergies  Allergen Reactions   Bee Venom Shortness Of Breath and Swelling   Codeine Phosphate Hives, Shortness Of Breath, Itching, Rash and Other (See Comments)    Tolerated multiple doses of IV morphine   Metoprolol Tartrate Other (See Comments)    loss of vision. Can take the XL tablet not regular    Pramipexole Other (See Comments)    Hallucinations     Tramadol     Pt has hallucinations    Ramipril Rash and Cough    Patient Measurements: Height: 5' 6.5" (168.9 cm) Weight: 84.6 kg (186 lb 6.4 oz) IBW/kg (Calculated) : 64.95 Heparin Dosing Weight: 82.7kg   Vital Signs: Temp: 97.6 F (36.4 C) (06/24 2024) Temp Source: Oral (06/24 2024) BP: 146/80 (06/24 2024) Pulse Rate: 95 (06/24 2024)  Labs: Recent Labs    12/31/22 2141 12/31/22 2141 12/31/22 2339 01/01/23 0804 01/01/23 1728 01/02/23 0658 01/03/23 0306  HGB 13.9  --   --  13.1  --  12.3* 12.2*  HCT 42.0  --   --  39.5  --  37.0* 36.9*  PLT 224  --   --  170  --  171 183  APTT  --   --   --  53*  --   --   --   LABPROT 21.7*  --   --  22.9*  --  14.6  --   INR 1.9*  --   --  2.0*  --  1.1  --   HEPARINUNFRC  --    < >  --  0.16* 0.38 0.46 0.50  CREATININE 0.85  --   --  0.74  --  0.80 0.75  TROPONINIHS 17  --  20*  --   --   --   --    < > = values in this interval not displayed.     Estimated Creatinine Clearance: 78.4 mL/min (by C-G formula based on SCr of 0.75 mg/dL).   Medical History: Past Medical History:  Diagnosis Date   Acute renal failure (HCC) 02/09/2013   Aortic stenosis 08/25/2010   a. Bentall aortic root replacement with a St. Jude mechanical valve and Hemashield conduit 02/2004.   Arthritis    Asthma    AV BLOCK, COMPLETE    a. s/p St Jude dual chamber pacemaker 02/2004.   CAD (coronary artery disease)    a. s/p CABGx2 (LIMA-dLAD, SVG-Cx). b. Low risk nuc 12/2012 without ischemia, EF 46% mild apical  hypokinesia (EF 55% inf HK by echo).   Carotid artery disease (HCC)    a. 0-39% bilateral ICA stenosis, stable mild hard plaque in carotid bulbs. F/u 03/2014 recommended.   Diabetes mellitus without complication (HCC)    type 2    DIVERTICULOSIS, COLON 10/17/2007   Ejection fraction    a. EF 55% with inf HK, mild MR by echo 12/2012.   GERD (gastroesophageal reflux disease)    hxof    GOUT 01/16/2007   Headache    hx of migraines    HEMORRHOIDS, INTERNAL 11/01/2008   Hx of adenomatous polyp of colon 02/2019   no recall needed   HYPERLIPIDEMIA 01/16/2007   HYPERTENSION 01/16/2007   Impaired glucose tolerance    LEG EDEMA 11/27/2008   Special screening for malignant neoplasm of prostate    Warfarin anticoagulation    AVR    Assessment: Patient admitted with CC of chest pain. Patient with extensive  cardiac history on warfarin for mechanical aortic valve. Last dose 12/31/22 at 20:15. Heparin drip started with plans for cath.  Patient is now s/p cath. Per communication with Dr. Elayne Guerin and Flavia Shipper, NP - will plan to resume heparin 6hr after sheath is pulled.  Goal of Therapy:  INR goal 2-3 per outpatient notes  Heparin level 0.3-0.7 units/ml Monitor platelets by anticoagulation protocol: Yes   Plan:  6hr after sheath removal, resume heparin at 1100 unit/hr (HL was therapeutic on this rate this AM) Plan to check heparin level 8hrs after restarting F/u plans for resuming warfarin  Loralee Pacas, PharmD, BCPS 01/03/2023  Please check AMION for all [facility] phone numbers After 10:00 PM, call Main Pharmacy [phone]

## 2023-01-03 NOTE — TOC CM/SW Note (Signed)
Transition of Care [facility]) - Inpatient Brief Assessment   Patient Details  Name: [name] MRN: [id number] Date of Birth: Jan 16, 1946  Transition of Care [facility]) CM/SW Contact:    Gala Lewandowsky, RN Phone Number: 01/03/2023, 2:36 PM   Clinical Narrative: Patient presented for chest pain-LHC. Plan for home on Plavix. Case Manager will continue to follow for additional transition of care needs.   Transition of Care Asessment: Insurance and Status: Insurance coverage has been reviewed Patient has primary care physician: Yes     Prior/Current Home Services: No current home services Social Determinants of Health Reivew: SDOH reviewed no interventions necessary Readmission risk has been reviewed: Yes Transition of care needs: no transition of care needs at this time

## 2023-01-03 NOTE — Interval H&P Note (Signed)
History and Physical Interval Note:  01/03/2023 5:10 PM  Nial Hawe Bergum  has presented today for surgery, with the diagnosis of unstable angina.  The various methods of treatment have been discussed with the patient and family. After consideration of risks, benefits and other options for treatment, the patient has consented to  Procedure(s): LEFT HEART CATH AND CORS/GRAFTS ANGIOGRAPHY (N/A) as a surgical intervention.  The patient's history has been reviewed, patient examined, no change in status, stable for surgery.  I have reviewed the patient's chart and labs.  Questions were answered to the patient's satisfaction.    Cath Lab Visit (complete for each Cath Lab visit)  Clinical Evaluation Leading to the Procedure:   ACS: Yes.    Non-ACS:    Anginal Classification: CCS IV  Anti-ischemic medical therapy: Minimal Therapy (1 class of medications)  Non-Invasive Test Results: No non-invasive testing performed  Prior CABG: Previous CABG       Theron Arista [facility] 01/03/2023 5:10 PM

## 2023-01-03 NOTE — Progress Notes (Signed)
Pt c/o CP 8/10 after ambulating to bathroom.  Relieved with 1 SL NTG. No acute EKG changes noted.  VSS.

## 2023-01-03 NOTE — Progress Notes (Signed)
ACT 159.  44fr sheath removed intact from right groin. Manual pressure applied to site x 35 min. No bleeding or hematoma noted. 4x4 gauze and tegaderm dressing was applied. Post activity and precautions explained; patient verbalized understanding. Noreene Larsson, RN at bedside. Care released.Mamie Levers

## 2023-01-03 NOTE — Progress Notes (Addendum)
Progress Note  Patient Name: [name] Date of Encounter: 01/03/2023  Primary Cardiologist: Donato Schultz, MD  Subjective   Had CP overnight requiring 3 SL NTG. Basically requiring 3 SL NTG a day since 6/22. No CP currently. Has mild HA likely from SL NTG. No other focal complaints though right eye has some scleral injection. Denies eye pain. He did not notice until sister pointed it out.   Inpatient Medications    Scheduled Meds:  aspirin  81 mg Oral Daily   atorvastatin  80 mg Oral Daily   carbidopa-levodopa  1 tablet Oral QHS   clopidogrel  75 mg Oral Daily   melatonin  5 mg Oral QHS   metoprolol succinate  50 mg Oral Daily   sodium chloride flush  3 mL Intravenous Q12H   Continuous Infusions:  sodium chloride     sodium chloride 1 mL/kg/hr (01/03/23 0549)   heparin 1,100 Units/hr (01/02/23 2203)   PRN Meds: sodium chloride, acetaminophen **OR** acetaminophen, albuterol, hydrALAZINE, morphine injection, nitroGLYCERIN, ondansetron **OR** ondansetron (ZOFRAN) IV, mouth rinse, sodium chloride flush   Vital Signs    Vitals:   01/03/23 0429 01/03/23 0440 01/03/23 0553 01/03/23 0737  BP: (!) 145/77  (!) 154/90 (!) 145/99  Pulse: 63  76 65  Resp: 16   20  Temp: 97.9 F (36.6 C)   97.9 F (36.6 C)  TempSrc: Oral   Oral  SpO2: 97%  93% 95%  Weight:  84.6 kg    Height:        Intake/Output Summary (Last 24 hours) at 01/03/2023 0959 Last data filed at 01/03/2023 0700 Gross per 24 hour  Intake 866.86 ml  Output --  Net 866.86 ml      01/03/2023    4:40 AM 01/02/2023    5:00 AM 01/01/2023    1:22 AM  Last 3 Weights  Weight (lbs) 186 lb 6.4 oz 187 lb 4.8 oz 185 lb 9.6 oz  Weight (kg) 84.55 kg 84.959 kg 84.188 kg     Telemetry    A sensed V paced - Personally Reviewed  ECG    atrial sensed, V paced rhythm 72bpm - Personally Reviewed  Physical Exam   GEN: No acute distress.  HEENT: Normocephalic, atraumatic, sclera nonicteric but with R scleral injection  without acute eye pain Neck: No JVD or bruits. Cardiac: RRR, crisp valve click, no rubs, acute murmurs or gallops.  Respiratory: Clear to auscultation bilaterally. Breathing is unlabored. GI: Soft, nontender, non-distended, BS +x 4. MS: no deformity. Extremities: No clubbing or cyanosis. No edema. Distal pedal pulses are 2+ and equal bilaterally. Neuro:  AAOx3. Follows commands. Baseline UE tremor Psych:  Responds to questions appropriately with a normal affect.  Labs    High Sensitivity Troponin:   Recent Labs  Lab 12/31/22 2141 12/31/22 2339  TROPONINIHS 17 20*      Cardiac EnzymesNo results for input(s): "TROPONINI" in the last 168 hours. No results for input(s): "TROPIPOC" in the last 168 hours.   Chemistry Recent Labs  Lab 01/01/23 0804 01/02/23 0658 01/03/23 0306  NA 139 137 135  K 4.3 3.6 3.8  CL 105 105 104  CO2 26 24 25   GLUCOSE 102* 94 101*  BUN 15 13 14   CREATININE 0.74 0.80 0.75  CALCIUM 8.7* 8.5* 8.5*  PROT 5.4*  --   --   ALBUMIN 3.4*  --   --   AST 33  --   --   ALT 22  --   --  ALKPHOS 38  --   --   BILITOT 1.2  --   --   GFRNONAA >60 >60 >60  ANIONGAP 8 8 6      Hematology Recent Labs  Lab 01/01/23 0804 01/02/23 0658 01/03/23 0306  WBC 7.3 8.4 7.5  RBC 4.29 4.07* 4.07*  HGB 13.1 12.3* 12.2*  HCT 39.5 37.0* 36.9*  MCV 92.1 90.9 90.7  MCH 30.5 30.2 30.0  MCHC 33.2 33.2 33.1  RDW 15.0 14.9 14.9  PLT 170 171 183    BNPNo results for input(s): "BNP", "PROBNP" in the last 168 hours.   DDimer No results for input(s): "DDIMER" in the last 168 hours.   Radiology    ECHOCARDIOGRAM COMPLETE  Result Date: 01/01/2023    ECHOCARDIOGRAM REPORT   Patient Name:   [facility] Date of Exam: 01/01/2023 Medical Rec #:  [id number]       Height:       66.5 in Accession #:    [id number]      Weight:       185.6 lb Date of Birth:  06/17/1946      BSA:          1.948 m Patient Age:    77 years        BP:           130/81 mmHg Patient Gender: M                HR:           74 bpm. Exam Location:  Inpatient Procedure: 2D Echo, Cardiac Doppler, Color Doppler and Intracardiac            Opacification Agent Indications:    Chest Pain R07.9  History:        Patient has prior history of Echocardiogram examinations, most                 recent 05/26/2022. CAD and Previous Myocardial Infarction,                 Pacemaker and Prior CABG, Carotid Disease, Aortic Valve Disease;                 Risk Factors:Diabetes, Hypertension and Former Smoker.                 Aortic Valve: St. Jude mechanical valve is present in the aortic                 position. Procedure Date: 2005.  Sonographer:    Dondra Prader RVT RCS Referring Phys: [phone] [facility]  Sonographer Comments: Suboptimal apical window. IMPRESSIONS  1. Left ventricular ejection fraction, by estimation, is 50 to 55%. The left ventricle has low normal function. The left ventricle demonstrates regional wall motion abnormalities (see scoring diagram/findings for description). The left ventricular internal cavity size was mildly dilated. Left ventricular diastolic parameters are indeterminate. There is mild hypokinesis of the left ventricular, mid-apical inferior wall. There is hypokinesis of the left ventricular, apical septal wall.  2. Right ventricular systolic function is normal. The right ventricular size is normal.  3. Right atrial size was moderately dilated.  4. The mitral valve is grossly normal. Mild mitral valve regurgitation. No evidence of mitral stenosis.  5. The aortic valve has been repaired/replaced. Aortic valve regurgitation is trivial. There is a St. Jude mechanical valve present in the aortic position. Procedure Date: 2005.  6. Aortic root/ascending aorta has been repaired/replaced.  7. The inferior vena  cava is normal in size with greater than 50% respiratory variability, suggesting right atrial pressure of 3 mmHg. Comparison(s): No significant change from prior study.  Conclusion(s)/Recommendation(s): No left ventricular mural or apical thrombus/thrombi. FINDINGS  Left Ventricle: Left ventricular ejection fraction, by estimation, is 50 to 55%. The left ventricle has low normal function. The left ventricle demonstrates regional wall motion abnormalities. Mild hypokinesis of the left ventricular, mid-apical inferior wall. Definity contrast agent was given IV to delineate the left ventricular endocardial borders. The left ventricular internal cavity size was mildly dilated. There is borderline left ventricular hypertrophy. Abnormal (paradoxical) septal motion, consistent with RV pacemaker. Left ventricular diastolic parameters are indeterminate. Right Ventricle: The right ventricular size is normal. No increase in right ventricular wall thickness. Right ventricular systolic function is normal. Left Atrium: Left atrial size was normal in size. Right Atrium: Right atrial size was moderately dilated. Pericardium: There is no evidence of pericardial effusion. Mitral Valve: The mitral valve is grossly normal. Mild mitral valve regurgitation. No evidence of mitral valve stenosis. Tricuspid Valve: The tricuspid valve is normal in structure. Tricuspid valve regurgitation is trivial. No evidence of tricuspid stenosis. Aortic Valve: The aortic valve has been repaired/replaced. Aortic valve regurgitation is trivial. Aortic valve mean gradient measures 5.0 mmHg. Aortic valve peak gradient measures 9.2 mmHg. Aortic valve area, by VTI measures 2.54 cm. There is a St. Jude  mechanical valve present in the aortic position. Procedure Date: 2005. Pulmonic Valve: The pulmonic valve was not well visualized. Pulmonic valve regurgitation is not visualized. No evidence of pulmonic stenosis. Aorta: The aortic root/ascending aorta has been repaired/replaced. Venous: The inferior vena cava is normal in size with greater than 50% respiratory variability, suggesting right atrial pressure of 3 mmHg. IAS/Shunts:  The atrial septum is grossly normal. Additional Comments: A device lead is visualized in the right ventricle.  LEFT VENTRICLE PLAX 2D LVIDd:         5.30 cm   Diastology LVIDs:         3.80 cm   LV e' medial:    8.38 cm/s LV PW:         0.90 cm   LV E/e' medial:  10.1 LV IVS:        1.10 cm   LV e' lateral:   6.42 cm/s LVOT diam:     1.80 cm   LV E/e' lateral: 13.1 LV SV:         79 LV SV Index:   41 LVOT Area:     2.54 cm  RIGHT VENTRICLE            IVC RV Basal diam:  3.50 cm    IVC diam: 1.50 cm RV S prime:     8.40 cm/s TAPSE (M-mode): 2.0 cm LEFT ATRIUM           Index        RIGHT ATRIUM           Index LA diam:      4.00 cm 2.05 cm/m   RA Area:     23.00 cm LA Vol (A4C): 33.2 ml 17.07 ml/m  RA Volume:   77.50 ml  39.78 ml/m  AORTIC VALVE                     PULMONIC VALVE AV Area (Vmax):    2.40 cm      PV Vmax:       0.92 m/s AV Area (Vmean):  2.33 cm      PV Peak grad:  3.4 mmHg AV Area (VTI):     2.54 cm AV Vmax:           151.67 cm/s AV Vmean:          101.967 cm/s AV VTI:            0.313 m AV Peak Grad:      9.2 mmHg AV Mean Grad:      5.0 mmHg LVOT Vmax:         143.00 cm/s LVOT Vmean:        93.300 cm/s LVOT VTI:          0.312 m LVOT/AV VTI ratio: 1.00  AORTA Ao Root diam: 3.20 cm Ao Asc diam:  3.80 cm MITRAL VALVE MV Area (PHT): 3.34 cm    SHUNTS MV Decel Time: 227 msec    Systemic VTI:  0.31 m MV E velocity: 84.40 cm/s  Systemic Diam: 1.80 cm MV A velocity: 97.10 cm/s MV E/A ratio:  0.87 Jodelle Red MD Electronically signed by Jodelle Red MD Signature Date/Time: 01/01/2023/2:52:48 PM    Final     Cardiac Studies   2d echo 01/01/23   1. Left ventricular ejection fraction, by estimation, is 50 to 55%. The  left ventricle has low normal function. The left ventricle demonstrates  regional wall motion abnormalities (see scoring diagram/findings for  description). The left ventricular  internal cavity size was mildly dilated. Left ventricular diastolic  parameters  are indeterminate. There is mild hypokinesis of the left  ventricular, mid-apical inferior wall. There is hypokinesis of the left  ventricular, apical septal wall.   2. Right ventricular systolic function is normal. The right ventricular  size is normal.   3. Right atrial size was moderately dilated.   4. The mitral valve is grossly normal. Mild mitral valve regurgitation.  No evidence of mitral stenosis.   5. The aortic valve has been repaired/replaced. Aortic valve  regurgitation is trivial. There is a St. Jude mechanical valve present in  the aortic position. Procedure Date: 2005.   6. Aortic root/ascending aorta has been repaired/replaced.   7. The inferior vena cava is normal in size with greater than 50%  respiratory variability, suggesting right atrial pressure of 3 mmHg.   Comparison(s): No significant change from prior study.   Conclusion(s)/Recommendation(s): No left ventricular mural or apical  thrombus/thrombi.   Patient Profile     77 y.o. male with CAD (s/p CABGx2 in 2005 with LIMA-LAD and SVG-LCx, NSTEMI 05/2022 s/p successful PCI of the left main, proximal LCx, OM to the mid vessel incorporating orbital atherectomy, Shockwave balloon lithotripsy and DES x 2 in overlapping fashion), aortic stenosis (s/p Bentall aortic root replacement with St. Jude mechanical valve), CHB (s/p PPM placement), ascending thoracic aortic aneurysm, Parkinson's disease, HTN, HLD and Type 2 DM admitted with chest pain.  Assessment & Plan    1. Chest pain, prior h/o CAD with prior CABG 2005 and last NSTEMI 05/2022 s/p PCI as above - hsTroponin low/flat at 17->20 - continue ASA, Plavix (on Plavix PTA given concomitant Coumadin) - continue atorvastatin - continue metoprolol - continue IV heparin - for cath today - I updated lab to reflect that he has grafts, updated consent order as well, sent FYI to nursing staff to update consent - may benefit from addition of long acting nitrate given  frequency of SL NTG  2. H/o aortic stenosis (s/p Bentall aortic root replacement 2005  with St. Jude mechanical valve) with ascending thoracic aortic aneurysm - coumadin held, on IV heparin for cath with INR 1.1 today - last chest CT 04/2022 with unchanged fusiform aneurysmal dilation of the distal ascending thoracic aorta measuring up to 4.5 cm, recommending 6 month follow-up, Dr. Anne Fu recommended re-eval 04/2023  3. CHB s/p PPM placement - continue OP EP f/u, overdue, last OV 2022, will need to get back on track as OP  4. HTN - mildly elevated BP noted,  ?resume home losartan - was held in case of cath but has normal renal function - continue metoprolol otherwise  5. Mild anemia - Hgb 13's on admission, here 12.2 today - similar ranges seen in the past - follow, no active bleeding reported  6. R eye injection - relayed to IM team to review  For questions or updates, please contact  [facility] Please consult [email or web address] for contact info under Cardiology/STEMI.  Signed, Laurann Montana, PA-C 01/03/2023, 9:59 AM     Personally seen and examined. Agree with APP above with the following comments:  Briefly 77 yo M with Parkinson's, CAD s/p CABG, AS s/p St. Jude and Bentall, CHB s/p PPM, with unstable angina Has needed 3 nitros this AM.  Current chest pain has improved No  SOB, no Palpitations.  Exam notable for  R subconjunctival injection R femoral +2.  Rolling hand tremor bilateral arms Mechanical heart sounds. Rest as [id number]  Tele: AS VP  For key conditions including  Acute on Chronic Combined HF (LVEF decreased from 2023) Unstable Angina S/p AV, PPM, and CABG Parkinson's  Would recommend  - will proceed with Cath, if no intervention targets will start Imdur and transition back to coumadin Risks and benefits of cardiac catheterization have been discussed with the patient.  These include bleeding, infection, kidney damage, stroke, heart attack, death.  The  patient understands these risks and is willing to proceed.  Access recommendations: R femoral given neuro issues Procedural considerations LAD territory intervention may improved LVEF  Riley Lam, MD FASE [facility] Cardiologist [facility]  [address] [place], [address] [place], [place] [address] [phone]  11:29 AM

## 2023-01-03 NOTE — Progress Notes (Signed)
PROGRESS NOTE    [name]  ZOX:[id number] DOB: 14-Oct-1945 DOA: 12/31/2022 PCP: [name], NP   Brief Narrative:  This is a 77 year old man medical history significant for CAD s/p CABG in 2015, multiple cath most recent on 07/28/2021, aortic stenosis s/p aortic root replacement with St Jude mechanical AVR, complete heart block s/p pacemaker placement, hypertension, hyperlipidemia, non-insulin-dependent diabetes mellitus type 2 and Parkinson disease presented to emergency department at [facility] with complaining of several days of chest pain worse with exertion but ongoing even at rest, not subsiding with sublingual nitroglycerin at home.  Assessment & Plan:   Principal Problem:   Unstable angina (HCC) Active Problems:   CAD (coronary artery disease), autologous vein bypass graft   HTN (hypertension)   History of permanent cardiac pacemaker placement   History of transcatheter aortic valve replacement (TAVR)   HLD (hyperlipidemia)   DM type 2 (diabetes mellitus, type 2) (HCC)   Parkinson disease  Acute unstable angina (HCC) -Patient loaded with 81 mg aspirin -Continue nitro, morphine per protocol, oxygen as appropriate -Continue heparin drip, metoprolol, atorvastatin, Plavix  -Cardiology consulted, continue telemetry -Echocardiogram completed, EF 50-55% notable left ventricular wall motion abnormality, dilation with indeterminate diastolic parameters and noted hypokinesis in multiple walls of left ventricle. -Further workup per cardiology -cardiac cath planned for later today    Hypertension, well-controlled -Continue metoprolol, hold losartan -PRN hydralazine   Heart failure, grade 1 diastolic dysfunction, not in acute exacerbation -Most recent echo November 2023 preserved EF -Continue metoprolol, ARB held  CAD (coronary artery disease), autologous vein bypass graft - Continue Toprol-XL 50 mg daily, Lipitor 80 mg daily and aspirin. - Continue tele    Parkinson disease - Appears to be at baseline per family at bedside - Carbidopa levodopa 50/200 mg at bedtime.   DM type 2 (diabetes mellitus, type 2) (HCC), well-controlled -Continue diabetic diet -A1c 6.4. -Continue sliding scale, hypoglycemic protocol   HLD (hyperlipidemia) - Continue Lipitor 80 mg daily  DVT prophylaxis: SCDs Start: 01/01/23 0141 Code Status:   Code Status: Full Code Family Communication: Sister at bedside - wife admitted to hospital recently  Status is: Inpt  Dispo: The patient is from: Home              Anticipated d/c is to: Home              Anticipated d/c date is: 24-48h              Patient currently NOT medically stable for discharge, awaiting cardiac evaluation and catheterization  Consultants:  Cardiology  Procedures:  Cardiac cath planned 6/24  Antimicrobials:  None  Subjective: No acute issues or events overnight, chest pain resolving. Denies symptoms with exertion today.  Otherwise denies nausea vomiting diarrhea constipation headache fevers chills shortness of breath.  Objective: Vitals:   01/03/23 0429 01/03/23 0440 01/03/23 0553 01/03/23 0737  BP: (!) 145/77  (!) 154/90 (!) 145/99  Pulse: 63  76 65  Resp: 16   20  Temp: 97.9 F (36.6 C)   97.9 F (36.6 C)  TempSrc: Oral   Oral  SpO2: 97%  93% 95%  Weight:  84.6 kg    Height:        Intake/Output Summary (Last 24 hours) at 01/03/2023 0759 Last data filed at 01/03/2023 0700 Gross per 24 hour  Intake 866.86 ml  Output --  Net 866.86 ml    Filed Weights   01/01/23 0122 01/02/23 0500 01/03/23 0440  Weight:  84.2 kg 85 kg 84.6 kg    Examination: General:  Pleasantly resting in bed, No acute distress. HEENT:  Normocephalic atraumatic.  Sclerae nonicteric, noninjected.  Extraocular movements intact bilaterally. Neck:  Without mass or deformity.  Trachea is midline. Lungs:  Clear to auscultate bilaterally without rhonchi, wheeze, or rales. Heart: Holosystolic murmur PMI  left sternal border without rubs or gallops. Abdomen:  Soft, nontender, nondistended.  Without guarding or rebound. Extremities: Without cyanosis, clubbing, edema, or obvious deformity. Skin:  Warm and dry, no erythema  Data Reviewed: I have personally reviewed following labs and imaging studies  CBC: Recent Labs  Lab 12/31/22 2141 01/01/23 0804 01/02/23 0658 01/03/23 0306  WBC 8.2 7.3 8.4 7.5  HGB 13.9 13.1 12.3* 12.2*  HCT 42.0 39.5 37.0* 36.9*  MCV 91.3 92.1 90.9 90.7  PLT 224 170 171 183    Basic Metabolic Panel: Recent Labs  Lab 12/31/22 2141 01/01/23 0804 01/02/23 0658 01/03/23 0306  NA 138 139 137 135  K 3.9 4.3 3.6 3.8  CL 102 105 105 104  CO2 30 26 24 25   GLUCOSE 145* 102* 94 101*  BUN 17 15 13 14   CREATININE 0.85 0.74 0.80 0.75  CALCIUM 9.7 8.7* 8.5* 8.5*    GFR: Estimated Creatinine Clearance: 78.4 mL/min (by C-G formula based on SCr of 0.75 mg/dL).  Coagulation Profile: Recent Labs  Lab 12/31/22 2141 01/01/23 0804 01/02/23 0658  INR 1.9* 2.0* 1.1    No results found for this or any previous visit (from the past 240 hour(s)).   Radiology Studies: ECHOCARDIOGRAM COMPLETE  Result Date: 01/01/2023    ECHOCARDIOGRAM REPORT   Patient Name:   SHARROD ACHILLE [facility] Date of Exam: 01/01/2023 Medical Rec #:  [id number]       Height:       66.5 in Accession #:    [id number]      Weight:       185.6 lb Date of Birth:  1946/04/23      BSA:          1.948 m Patient Age:    39 years        BP:           130/81 mmHg Patient Gender: M               HR:           74 bpm. Exam Location:  Inpatient Procedure: 2D Echo, Cardiac Doppler, Color Doppler and Intracardiac            Opacification Agent Indications:    Chest Pain R07.9  History:        Patient has prior history of Echocardiogram examinations, most                 recent 05/26/2022. CAD and Previous Myocardial Infarction,                 Pacemaker and Prior CABG, Carotid Disease, Aortic Valve Disease;                  Risk Factors:Diabetes, Hypertension and Former Smoker.                 Aortic Valve: St. Jude mechanical valve is present in the aortic                 position. Procedure Date: 2005.  Sonographer:    Dondra Prader RVT RCS Referring Phys: [id number] [facility]  Sonographer Comments: Suboptimal apical window.  IMPRESSIONS  1. Left ventricular ejection fraction, by estimation, is 50 to 55%. The left ventricle has low normal function. The left ventricle demonstrates regional wall motion abnormalities (see scoring diagram/findings for description). The left ventricular internal cavity size was mildly dilated. Left ventricular diastolic parameters are indeterminate. There is mild hypokinesis of the left ventricular, mid-apical inferior wall. There is hypokinesis of the left ventricular, apical septal wall.  2. Right ventricular systolic function is normal. The right ventricular size is normal.  3. Right atrial size was moderately dilated.  4. The mitral valve is grossly normal. Mild mitral valve regurgitation. No evidence of mitral stenosis.  5. The aortic valve has been repaired/replaced. Aortic valve regurgitation is trivial. There is a St. Jude mechanical valve present in the aortic position. Procedure Date: 2005.  6. Aortic root/ascending aorta has been repaired/replaced.  7. The inferior vena cava is normal in size with greater than 50% respiratory variability, suggesting right atrial pressure of 3 mmHg. Comparison(s): No significant change from prior study. Conclusion(s)/Recommendation(s): No left ventricular mural or apical thrombus/thrombi. FINDINGS  Left Ventricle: Left ventricular ejection fraction, by estimation, is 50 to 55%. The left ventricle has low normal function. The left ventricle demonstrates regional wall motion abnormalities. Mild hypokinesis of the left ventricular, mid-apical inferior wall. Definity contrast agent was given IV to delineate the left ventricular endocardial borders. The left  ventricular internal cavity size was mildly dilated. There is borderline left ventricular hypertrophy. Abnormal (paradoxical) septal motion, consistent with RV pacemaker. Left ventricular diastolic parameters are indeterminate. Right Ventricle: The right ventricular size is normal. No increase in right ventricular wall thickness. Right ventricular systolic function is normal. Left Atrium: Left atrial size was normal in size. Right Atrium: Right atrial size was moderately dilated. Pericardium: There is no evidence of pericardial effusion. Mitral Valve: The mitral valve is grossly normal. Mild mitral valve regurgitation. No evidence of mitral valve stenosis. Tricuspid Valve: The tricuspid valve is normal in structure. Tricuspid valve regurgitation is trivial. No evidence of tricuspid stenosis. Aortic Valve: The aortic valve has been repaired/replaced. Aortic valve regurgitation is trivial. Aortic valve mean gradient measures 5.0 mmHg. Aortic valve peak gradient measures 9.2 mmHg. Aortic valve area, by VTI measures 2.54 cm. There is a St. Jude  mechanical valve present in the aortic position. Procedure Date: 2005. Pulmonic Valve: The pulmonic valve was not well visualized. Pulmonic valve regurgitation is not visualized. No evidence of pulmonic stenosis. Aorta: The aortic root/ascending aorta has been repaired/replaced. Venous: The inferior vena cava is normal in size with greater than 50% respiratory variability, suggesting right atrial pressure of 3 mmHg. IAS/Shunts: The atrial septum is grossly normal. Additional Comments: A device lead is visualized in the right ventricle.  LEFT VENTRICLE PLAX 2D LVIDd:         5.30 cm   Diastology LVIDs:         3.80 cm   LV e' medial:    8.38 cm/s LV PW:         0.90 cm   LV E/e' medial:  10.1 LV IVS:        1.10 cm   LV e' lateral:   6.42 cm/s LVOT diam:     1.80 cm   LV E/e' lateral: 13.1 LV SV:         79 LV SV Index:   41 LVOT Area:     2.54 cm  RIGHT VENTRICLE             IVC RV  Basal diam:  3.50 cm    IVC diam: 1.50 cm RV S prime:     8.40 cm/s TAPSE (M-mode): 2.0 cm LEFT ATRIUM           Index        RIGHT ATRIUM           Index LA diam:      4.00 cm 2.05 cm/m   RA Area:     23.00 cm LA Vol (A4C): 33.2 ml 17.07 ml/m  RA Volume:   77.50 ml  39.78 ml/m  AORTIC VALVE                     PULMONIC VALVE AV Area (Vmax):    2.40 cm      PV Vmax:       0.92 m/s AV Area (Vmean):   2.33 cm      PV Peak grad:  3.4 mmHg AV Area (VTI):     2.54 cm AV Vmax:           151.67 cm/s AV Vmean:          101.967 cm/s AV VTI:            0.313 m AV Peak Grad:      9.2 mmHg AV Mean Grad:      5.0 mmHg LVOT Vmax:         143.00 cm/s LVOT Vmean:        93.300 cm/s LVOT VTI:          0.312 m LVOT/AV VTI ratio: 1.00  AORTA Ao Root diam: 3.20 cm Ao Asc diam:  3.80 cm MITRAL VALVE MV Area (PHT): 3.34 cm    SHUNTS MV Decel Time: 227 msec    Systemic VTI:  0.31 m MV E velocity: 84.40 cm/s  Systemic Diam: 1.80 cm MV A velocity: 97.10 cm/s MV E/A ratio:  0.87 Jodelle Red MD Electronically signed by Jodelle Red MD Signature Date/Time: 01/01/2023/2:52:48 PM    Final     Scheduled Meds:  aspirin  81 mg Oral Daily   atorvastatin  80 mg Oral Daily   carbidopa-levodopa  1 tablet Oral QHS   clopidogrel  75 mg Oral Daily   melatonin  5 mg Oral QHS   metoprolol succinate  50 mg Oral Daily   sodium chloride flush  3 mL Intravenous Q12H   Continuous Infusions:  sodium chloride     sodium chloride 1 mL/kg/hr (01/03/23 0549)   heparin 1,100 Units/hr (01/02/23 2203)     LOS: 2 days   Time spent:  Azucena Fallen, DO [facility]  If 7PM-7AM, please contact night-coverage [email or web address]  01/03/2023, 7:59 AM

## 2023-01-03 NOTE — Progress Notes (Addendum)
Subjective    Called to see pt due to dyspnea and chest pain.  Pt reports a 2 wk h/o intermittent c/p that has been constant since late on the evening of 6/23.  He is s/p cath and PCI/DES to the LCX.  Upon arrival to the floor he reported ongoing chest pain, unchanged since last night/earlier today prior to cath.  He was also noted by nsg staff to be more sob w/ wheezing and crackles on exam.  Currently on O2 @ 4lpm via [place] w/ O2 sats in the low to mid 90's.  Objective    Vitals:   01/03/23 1711 01/03/23 1900  BP:  119/75  Pulse:  99  Resp:    Temp:    SpO2: 98% 92%   Pleasant, NAD, AAOx3.  Resting hand tremors.  Lungs diminished breath sounds w/ faint insp/exp wheezing and bibasilar crackles. Cor RRR, mech S2. Abd soft, nt/nd/bs+x4. Ext no edema.  R femoral cath site w/ sheath in place, no bleeding/bruit/hematoma.  DP 2+ bilat.  Lab Results  Component Value Date   WBC 7.5 01/03/2023   HGB 12.2 (L) 01/03/2023   HCT 36.9 (L) 01/03/2023   MCV 90.7 01/03/2023   PLT 183 01/03/2023    Lab Results  Component Value Date   CREATININE 0.75 01/03/2023   BUN 14 01/03/2023   NA 135 01/03/2023   K 3.8 01/03/2023   CL 104 01/03/2023   CO2 25 01/03/2023   Recent Labs  Lab 12/31/22 2141 12/31/22 2339  TROPONINIHS 17 20*     BNP    Component Value Date/Time   BNP 102.9 (H) 01/26/2016 1422  _____________   ECG 6.24.2024 1643   A sensed, V paced, 98 _____________   Tele  A sensed, V paced, 90's _____________  2D Echocardiogram 6.22.2024   1. Left ventricular ejection fraction, by estimation, is 50 to 55%. The  left ventricle has low normal function. The left ventricle demonstrates  regional wall motion abnormalities (see scoring diagram/findings for  description). The left ventricular  internal cavity size was mildly dilated. Left ventricular diastolic  parameters are indeterminate. There is mild hypokinesis of the left  ventricular, mid-apical inferior wall. There is  hypokinesis of the left  ventricular, apical septal wall.   2. Right ventricular systolic function is normal. The right ventricular  size is normal.   3. Right atrial size was moderately dilated.   4. The mitral valve is grossly normal. Mild mitral valve regurgitation.  No evidence of mitral stenosis.   5. The aortic valve has been repaired/replaced. Aortic valve  regurgitation is trivial. There is a St. Jude mechanical valve present in  the aortic position. Procedure Date: 2005.   6. Aortic root/ascending aorta has been repaired/replaced.   7. The inferior vena cava is normal in size with greater than 50%  respiratory variability, suggesting right atrial pressure of 3 mmHg.  _____________   Cardiac Catheterization/PCI 6.24.2024  Diagnostic Dominance: Right  Intervention    Assessment & Plan    1.  Dyspnea/Acute HFpEF:  pt returned from cath labs w/ O2 sats in the low-90's on RA.  EF 50-55% by echo earlier this admission.  +863.9 ml today and + 3322.5 L since admission.  Takes lasix 40mg  daily @ home, but this has been on hold.  LVEDP not eval during cath today.  On exam, breath sounds are diminished w/ faint insp/exp wheezing and bibasilar crackles.  KVO IVF.  Lasix 40 mg IV x 1  now.  F/u CXR.  2.  CAD/precordial chest pain:  Pt w/ a 2 wk h/o chest pain, which has been constant over the past >24 hrs.  Sl worse w/ palpation over the precordium.  Hemodynamically stable.  F/u ecg and hsTrop.  Cont asa, plavix, ? blocker, and statin.  Nicolasa Ducking, NP 01/03/2023, 7:52 PM

## 2023-01-03 NOTE — Progress Notes (Signed)
ANTICOAGULATION CONSULT NOTE  Pharmacy Consult for heparin  Indication: chest pain/ACS  Allergies  Allergen Reactions   Bee Venom Shortness Of Breath and Swelling   Codeine Phosphate Hives, Shortness Of Breath, Itching, Rash and Other (See Comments)    Tolerated multiple doses of IV morphine   Metoprolol Tartrate Other (See Comments)    loss of vision. Can take the XL tablet not regular    Pramipexole Other (See Comments)    Hallucinations     Tramadol     Pt has hallucinations    Ramipril Rash and Cough    Patient Measurements: Height: 5' 6.5" (168.9 cm) Weight: 84.6 kg (186 lb 6.4 oz) IBW/kg (Calculated) : 64.95 Heparin Dosing Weight: 82.7kg   Vital Signs: Temp: 97.9 F (36.6 C) (06/24 0429) Temp Source: Oral (06/24 0429) BP: 154/90 (06/24 0553) Pulse Rate: 76 (06/24 0553)  Labs: Recent Labs    12/31/22 2141 12/31/22 2141 12/31/22 2339 01/01/23 0804 01/01/23 1728 01/02/23 0658 01/03/23 0306  HGB 13.9  --   --  13.1  --  12.3* 12.2*  HCT 42.0  --   --  39.5  --  37.0* 36.9*  PLT 224  --   --  170  --  171 183  APTT  --   --   --  53*  --   --   --   LABPROT 21.7*  --   --  22.9*  --  14.6  --   INR 1.9*  --   --  2.0*  --  1.1  --   HEPARINUNFRC  --    < >  --  0.16* 0.38 0.46 0.50  CREATININE 0.85  --   --  0.74  --  0.80 0.75  TROPONINIHS 17  --  20*  --   --   --   --    < > = values in this interval not displayed.     Estimated Creatinine Clearance: 78.4 mL/min (by C-G formula based on SCr of 0.75 mg/dL).   Medical History: Past Medical History:  Diagnosis Date   Acute renal failure (HCC) 02/09/2013   Aortic stenosis 08/25/2010   a. [place] aortic root replacement with a St. Jude mechanical valve and Hemashield conduit 02/2004.   Arthritis    Asthma    AV BLOCK, COMPLETE    a. s/p St Jude dual chamber pacemaker 02/2004.   CAD (coronary artery disease)    a. s/p CABGx2 (LIMA-dLAD, SVG-Cx). b. Low risk nuc 12/2012 without ischemia, EF 46% mild apical  hypokinesia (EF 55% inf HK by echo).   Carotid artery disease (HCC)    a. 0-39% bilateral ICA stenosis, stable mild hard plaque in carotid bulbs. F/u 03/2014 recommended.   Diabetes mellitus without complication (HCC)    type 2    DIVERTICULOSIS, COLON 10/17/2007   Ejection fraction    a. EF 55% with inf HK, mild MR by echo 12/2012.   GERD (gastroesophageal reflux disease)    hxof    GOUT 01/16/2007   Headache    hx of migraines    HEMORRHOIDS, INTERNAL 11/01/2008   Hx of adenomatous polyp of colon 02/2019   no recall needed   HYPERLIPIDEMIA 01/16/2007   HYPERTENSION 01/16/2007   Impaired glucose tolerance    LEG EDEMA 11/27/2008   Special screening for malignant neoplasm of prostate    Warfarin anticoagulation    AVR    Assessment: Patient admitted with CC of chest pain. Patient with extensive  cardiac history on warfarin for mechanical aortic valve. Last dose 12/31/22 at 20:15. Heparin drip started with plans for cath.  Heparin level is therapeutic at 0.5, CBC stable. Cath planned today.  Goal of Therapy:  INR goal 2-3 per outpatient notes  Heparin level 0.3-0.7 units/ml Monitor platelets by anticoagulation protocol: Yes   Plan:  Continue heparin to 1100 unit/hr  F/U anticoag plans post/cath  Fredonia Highland, PharmD, BCPS, [facility] Clinical Pharmacist [phone] Please check AMION for all [facility] numbers 01/03/2023

## 2023-01-03 NOTE — H&P (View-Only) (Signed)
Progress Note  Patient Name: [name] Date of Encounter: 01/03/2023  Primary Cardiologist: Mark Skains, MD  Subjective   Had CP overnight requiring 3 SL NTG. Basically requiring 3 SL NTG a day since 6/22. No CP currently. Has mild HA likely from SL NTG. No other focal complaints though right eye has some scleral injection. Denies eye pain. He did not notice until sister pointed it out.   Inpatient Medications    Scheduled Meds:  aspirin  81 mg Oral Daily   atorvastatin  80 mg Oral Daily   carbidopa-levodopa  1 tablet Oral QHS   clopidogrel  75 mg Oral Daily   melatonin  5 mg Oral QHS   metoprolol succinate  50 mg Oral Daily   sodium chloride flush  3 mL Intravenous Q12H   Continuous Infusions:  sodium chloride     sodium chloride 1 mL/kg/hr (01/03/23 0549)   heparin 1,100 Units/hr (01/02/23 2203)   PRN Meds: sodium chloride, acetaminophen **OR** acetaminophen, albuterol, hydrALAZINE, morphine injection, nitroGLYCERIN, ondansetron **OR** ondansetron (ZOFRAN) IV, mouth rinse, sodium chloride flush   Vital Signs    Vitals:   01/03/23 0429 01/03/23 0440 01/03/23 0553 01/03/23 0737  BP: (!) 145/77  (!) 154/90 (!) 145/99  Pulse: 63  76 65  Resp: 16   20  Temp: 97.9 F (36.6 C)   97.9 F (36.6 C)  TempSrc: Oral   Oral  SpO2: 97%  93% 95%  Weight:  84.6 kg    Height:        Intake/Output Summary (Last 24 hours) at 01/03/2023 0959 Last data filed at 01/03/2023 0700 Gross per 24 hour  Intake 866.86 ml  Output --  Net 866.86 ml      01/03/2023    4:40 AM 01/02/2023    5:00 AM 01/01/2023    1:22 AM  Last 3 Weights  Weight (lbs) 186 lb 6.4 oz 187 lb 4.8 oz 185 lb 9.6 oz  Weight (kg) 84.55 kg 84.959 kg 84.188 kg     Telemetry    A sensed V paced - Personally Reviewed  ECG    atrial sensed, V paced rhythm 72bpm - Personally Reviewed  Physical Exam   GEN: No acute distress.  HEENT: Normocephalic, atraumatic, sclera nonicteric but with R scleral injection  without acute eye pain Neck: No JVD or bruits. Cardiac: RRR, crisp valve click, no rubs, acute murmurs or gallops.  Respiratory: Clear to auscultation bilaterally. Breathing is unlabored. GI: Soft, nontender, non-distended, BS +x 4. MS: no deformity. Extremities: No clubbing or cyanosis. No edema. Distal pedal pulses are 2+ and equal bilaterally. Neuro:  AAOx3. Follows commands. Baseline UE tremor Psych:  Responds to questions appropriately with a normal affect.  Labs    High Sensitivity Troponin:   Recent Labs  Lab 12/31/22 2141 12/31/22 2339  TROPONINIHS 17 20*      Cardiac EnzymesNo results for input(s): "TROPONINI" in the last 168 hours. No results for input(s): "TROPIPOC" in the last 168 hours.   Chemistry Recent Labs  Lab 01/01/23 0804 01/02/23 0658 01/03/23 0306  NA 139 137 135  K 4.3 3.6 3.8  CL 105 105 104  CO2 26 24 25  GLUCOSE 102* 94 101*  BUN 15 13 14  CREATININE 0.74 0.80 0.75  CALCIUM 8.7* 8.5* 8.5*  PROT 5.4*  --   --   ALBUMIN 3.4*  --   --   AST 33  --   --   ALT 22  --   --     ALKPHOS 38  --   --   BILITOT 1.2  --   --   GFRNONAA >60 >60 >60  ANIONGAP 8 8 6     Hematology Recent Labs  Lab 01/01/23 0804 01/02/23 0658 01/03/23 0306  WBC 7.3 8.4 7.5  RBC 4.29 4.07* 4.07*  HGB 13.1 12.3* 12.2*  HCT 39.5 37.0* 36.9*  MCV 92.1 90.9 90.7  MCH 30.5 30.2 30.0  MCHC 33.2 33.2 33.1  RDW 15.0 14.9 14.9  PLT 170 171 183    BNPNo results for input(s): "BNP", "PROBNP" in the last 168 hours.   DDimer No results for input(s): "DDIMER" in the last 168 hours.   Radiology    ECHOCARDIOGRAM COMPLETE  Result Date: 01/01/2023    ECHOCARDIOGRAM REPORT   Patient Name:   [name] Date of Exam: 01/01/2023 Medical Rec #:  [id number]       Height:       66.5 in Accession #:    [id number]      Weight:       185.6 lb Date of Birth:  03/13/1946      BSA:          1.948 m Patient Age:    77 years        BP:           130/81 mmHg Patient Gender: M                HR:           74 bpm. Exam Location:  Inpatient Procedure: 2D Echo, Cardiac Doppler, Color Doppler and Intracardiac            Opacification Agent Indications:    Chest Pain R07.9  History:        Patient has prior history of Echocardiogram examinations, most                 recent 05/26/2022. CAD and Previous Myocardial Infarction,                 Pacemaker and Prior CABG, Carotid Disease, Aortic Valve Disease;                 Risk Factors:Diabetes, Hypertension and Former Smoker.                 Aortic Valve: St. Jude mechanical valve is present in the aortic                 position. Procedure Date: 2005.  Sonographer:    Gina Gealow RVT RCS Referring Phys: [id number] SUBRINA SUNDIL  Sonographer Comments: Suboptimal apical window. IMPRESSIONS  1. Left ventricular ejection fraction, by estimation, is 50 to 55%. The left ventricle has low normal function. The left ventricle demonstrates regional wall motion abnormalities (see scoring diagram/findings for description). The left ventricular internal cavity size was mildly dilated. Left ventricular diastolic parameters are indeterminate. There is mild hypokinesis of the left ventricular, mid-apical inferior wall. There is hypokinesis of the left ventricular, apical septal wall.  2. Right ventricular systolic function is normal. The right ventricular size is normal.  3. Right atrial size was moderately dilated.  4. The mitral valve is grossly normal. Mild mitral valve regurgitation. No evidence of mitral stenosis.  5. The aortic valve has been repaired/replaced. Aortic valve regurgitation is trivial. There is a St. Jude mechanical valve present in the aortic position. Procedure Date: 2005.  6. Aortic root/ascending aorta has been repaired/replaced.  7. The inferior vena   cava is normal in size with greater than 50% respiratory variability, suggesting right atrial pressure of 3 mmHg. Comparison(s): No significant change from prior study.  Conclusion(s)/Recommendation(s): No left ventricular mural or apical thrombus/thrombi. FINDINGS  Left Ventricle: Left ventricular ejection fraction, by estimation, is 50 to 55%. The left ventricle has low normal function. The left ventricle demonstrates regional wall motion abnormalities. Mild hypokinesis of the left ventricular, mid-apical inferior wall. Definity contrast agent was given IV to delineate the left ventricular endocardial borders. The left ventricular internal cavity size was mildly dilated. There is borderline left ventricular hypertrophy. Abnormal (paradoxical) septal motion, consistent with RV pacemaker. Left ventricular diastolic parameters are indeterminate. Right Ventricle: The right ventricular size is normal. No increase in right ventricular wall thickness. Right ventricular systolic function is normal. Left Atrium: Left atrial size was normal in size. Right Atrium: Right atrial size was moderately dilated. Pericardium: There is no evidence of pericardial effusion. Mitral Valve: The mitral valve is grossly normal. Mild mitral valve regurgitation. No evidence of mitral valve stenosis. Tricuspid Valve: The tricuspid valve is normal in structure. Tricuspid valve regurgitation is trivial. No evidence of tricuspid stenosis. Aortic Valve: The aortic valve has been repaired/replaced. Aortic valve regurgitation is trivial. Aortic valve mean gradient measures 5.0 mmHg. Aortic valve peak gradient measures 9.2 mmHg. Aortic valve area, by VTI measures 2.54 cm. There is a St. Jude  mechanical valve present in the aortic position. Procedure Date: 2005. Pulmonic Valve: The pulmonic valve was not well visualized. Pulmonic valve regurgitation is not visualized. No evidence of pulmonic stenosis. Aorta: The aortic root/ascending aorta has been repaired/replaced. Venous: The inferior vena cava is normal in size with greater than 50% respiratory variability, suggesting right atrial pressure of 3 mmHg. IAS/Shunts:  The atrial septum is grossly normal. Additional Comments: A device lead is visualized in the right ventricle.  LEFT VENTRICLE PLAX 2D LVIDd:         5.30 cm   Diastology LVIDs:         3.80 cm   LV e' medial:    8.38 cm/s LV PW:         0.90 cm   LV E/e' medial:  10.1 LV IVS:        1.10 cm   LV e' lateral:   6.42 cm/s LVOT diam:     1.80 cm   LV E/e' lateral: 13.1 LV SV:         79 LV SV Index:   41 LVOT Area:     2.54 cm  RIGHT VENTRICLE            IVC RV Basal diam:  3.50 cm    IVC diam: 1.50 cm RV S prime:     8.40 cm/s TAPSE (M-mode): 2.0 cm LEFT ATRIUM           Index        RIGHT ATRIUM           Index LA diam:      4.00 cm 2.05 cm/m   RA Area:     23.00 cm LA Vol (A4C): 33.2 ml 17.07 ml/m  RA Volume:   77.50 ml  39.78 ml/m  AORTIC VALVE                     PULMONIC VALVE AV Area (Vmax):    2.40 cm      PV Vmax:       0.92 m/s AV Area (Vmean):     2.33 cm      PV Peak grad:  3.4 mmHg AV Area (VTI):     2.54 cm AV Vmax:           151.67 cm/s AV Vmean:          101.967 cm/s AV VTI:            0.313 m AV Peak Grad:      9.2 mmHg AV Mean Grad:      5.0 mmHg LVOT Vmax:         143.00 cm/s LVOT Vmean:        93.300 cm/s LVOT VTI:          0.312 m LVOT/AV VTI ratio: 1.00  AORTA Ao Root diam: 3.20 cm Ao Asc diam:  3.80 cm MITRAL VALVE MV Area (PHT): 3.34 cm    SHUNTS MV Decel Time: 227 msec    Systemic VTI:  0.31 m MV E velocity: 84.40 cm/s  Systemic Diam: 1.80 cm MV A velocity: 97.10 cm/s MV E/A ratio:  0.87 Bridgette Christopher MD Electronically signed by Bridgette Christopher MD Signature Date/Time: 01/01/2023/2:52:48 PM    Final     Cardiac Studies   2d echo 01/01/23   1. Left ventricular ejection fraction, by estimation, is 50 to 55%. The  left ventricle has low normal function. The left ventricle demonstrates  regional wall motion abnormalities (see scoring diagram/findings for  description). The left ventricular  internal cavity size was mildly dilated. Left ventricular diastolic  parameters  are indeterminate. There is mild hypokinesis of the left  ventricular, mid-apical inferior wall. There is hypokinesis of the left  ventricular, apical septal wall.   2. Right ventricular systolic function is normal. The right ventricular  size is normal.   3. Right atrial size was moderately dilated.   4. The mitral valve is grossly normal. Mild mitral valve regurgitation.  No evidence of mitral stenosis.   5. The aortic valve has been repaired/replaced. Aortic valve  regurgitation is trivial. There is a St. Jude mechanical valve present in  the aortic position. Procedure Date: 2005.   6. Aortic root/ascending aorta has been repaired/replaced.   7. The inferior vena cava is normal in size with greater than 50%  respiratory variability, suggesting right atrial pressure of 3 mmHg.   Comparison(s): No significant change from prior study.   Conclusion(s)/Recommendation(s): No left ventricular mural or apical  thrombus/thrombi.   Patient Profile     76 y.o. male with CAD (s/p CABGx2 in 2005 with LIMA-LAD and SVG-LCx, NSTEMI 05/2022 s/p successful PCI of the left main, proximal LCx, OM to the mid vessel incorporating orbital atherectomy, Shockwave balloon lithotripsy and DES x 2 in overlapping fashion), aortic stenosis (s/p Bentall aortic root replacement with St. Jude mechanical valve), CHB (s/p PPM placement), ascending thoracic aortic aneurysm, Parkinson's disease, HTN, HLD and Type 2 DM admitted with chest pain.  Assessment & Plan    1. Chest pain, prior h/o CAD with prior CABG 2005 and last NSTEMI 05/2022 s/p PCI as above - hsTroponin low/flat at 17->20 - continue ASA, Plavix (on Plavix PTA given concomitant Coumadin) - continue atorvastatin - continue metoprolol - continue IV heparin - for cath today - I updated lab to reflect that he has grafts, updated consent order as well, sent FYI to nursing staff to update consent - may benefit from addition of long acting nitrate given  frequency of SL NTG  2. H/o aortic stenosis (s/p Bentall aortic root replacement 2005   with St. Jude mechanical valve) with ascending thoracic aortic aneurysm - coumadin held, on IV heparin for cath with INR 1.1 today - last chest CT 04/2022 with unchanged fusiform aneurysmal dilation of the distal ascending thoracic aorta measuring up to 4.5 cm, recommending 6 month follow-up, Dr. Skains recommended re-eval 04/2023  3. CHB s/p PPM placement - continue OP EP f/u, overdue, last OV 2022, will need to get back on track as OP  4. HTN - mildly elevated BP noted,  ?resume home losartan - was held in case of cath but has normal renal function - continue metoprolol otherwise  5. Mild anemia - Hgb 13's on admission, here 12.2 today - similar ranges seen in the past - follow, no active bleeding reported  6. R eye injection - relayed to IM team to review  For questions or updates, please contact [facility] Please consult [email or web address] for contact info under Cardiology/STEMI.  Signed, Dayna N Dunn, PA-C 01/03/2023, 9:59 AM     Personally seen and examined. Agree with APP above with the following comments:  Briefly 76 yo M with Parkinson's, CAD s/p CABG, AS s/p St. Jude and Bentall, CHB s/p PPM, with unstable angina Has needed 3 nitros this AM.  Current chest pain has improved No  SOB, no Palpitations.  Exam notable for  R subconjunctival injection R femoral +2.  Rolling hand tremor bilateral arms Mechanical heart sounds. Rest as abov3  Tele: AS VP  For key conditions including  Acute on Chronic Combined HF (LVEF decreased from 2023) Unstable Angina S/p AV, PPM, and CABG Parkinson's  Would recommend  - will proceed with Cath, if no intervention targets will start Imdur and transition back to coumadin Risks and benefits of cardiac catheterization have been discussed with the patient.  These include bleeding, infection, kidney damage, stroke, heart attack, death.  The  patient understands these risks and is willing to proceed.  Access recommendations: R femoral given neuro issues Procedural considerations LAD territory intervention may improved LVEF  Alayia Meggison, MD FASE FACC Cardiologist [facility]  [address], [address] [place], [place] [address] [phone]  11:29 AM  

## 2023-01-04 ENCOUNTER — Other Ambulatory Visit (HOSPITAL_COMMUNITY): Payer: Self-pay

## 2023-01-04 ENCOUNTER — Encounter (HOSPITAL_COMMUNITY): Payer: Self-pay | Admitting: Cardiology

## 2023-01-04 DIAGNOSIS — I200 Unstable angina: Secondary | ICD-10-CM | POA: Diagnosis not present

## 2023-01-04 LAB — CBC
HCT: 39.1 % (ref 39.0–52.0)
Hemoglobin: 13 g/dL (ref 13.0–17.0)
MCH: 30.3 pg (ref 26.0–34.0)
MCHC: 33.2 g/dL (ref 30.0–36.0)
MCV: 91.1 fL (ref 80.0–100.0)
Platelets: 184 10*3/uL (ref 150–400)
RBC: 4.29 MIL/uL (ref 4.22–5.81)
RDW: 15 % (ref 11.5–15.5)
WBC: 13.6 10*3/uL — ABNORMAL HIGH (ref 4.0–10.5)
nRBC: 0 % (ref 0.0–0.2)

## 2023-01-04 LAB — BASIC METABOLIC PANEL
Anion gap: 10 (ref 5–15)
BUN: 12 mg/dL (ref 8–23)
CO2: 22 mmol/L (ref 22–32)
Calcium: 8.3 mg/dL — ABNORMAL LOW (ref 8.9–10.3)
Chloride: 104 mmol/L (ref 98–111)
Creatinine, Ser: 0.88 mg/dL (ref 0.61–1.24)
GFR, Estimated: >60 mL/min (ref >60)
Glucose, Bld: 101 mg/dL — ABNORMAL HIGH (ref 70–99)
Potassium: 3.6 mmol/L (ref 3.5–5.1)
Sodium: 136 mmol/L (ref 135–145)

## 2023-01-04 MED ORDER — ENOXAPARIN SODIUM 80 MG/0.8ML IJ SOSY
80.0000 mg | PREFILLED_SYRINGE | Freq: Once | INTRAMUSCULAR | Status: AC
  Administered 2023-01-04: 80 mg via SUBCUTANEOUS
  Filled 2023-01-04: qty 0.8

## 2023-01-04 MED ORDER — ISOSORBIDE MONONITRATE ER 30 MG PO TB24
15.0000 mg | ORAL_TABLET | Freq: Every day | ORAL | Status: DC
  Administered 2023-01-04 – 2023-01-06 (×3): 15 mg via ORAL
  Filled 2023-01-04 (×3): qty 1

## 2023-01-04 MED ORDER — WARFARIN SODIUM 5 MG PO TABS
12.0000 mg | ORAL_TABLET | Freq: Once | ORAL | Status: AC
  Administered 2023-01-04: 12 mg via ORAL
  Filled 2023-01-04: qty 1

## 2023-01-04 MED ORDER — WARFARIN - PHARMACIST DOSING INPATIENT: Freq: Every day | Status: DC

## 2023-01-04 MED ORDER — FUROSEMIDE 40 MG PO TABS
40.0000 mg | ORAL_TABLET | Freq: Every day | ORAL | Status: DC
  Administered 2023-01-04 – 2023-01-06 (×3): 40 mg via ORAL
  Filled 2023-01-04 (×3): qty 1

## 2023-01-04 MED ORDER — ENOXAPARIN SODIUM 80 MG/0.8ML IJ SOSY
80.0000 mg | PREFILLED_SYRINGE | Freq: Two times a day (BID) | INTRAMUSCULAR | Status: DC
  Administered 2023-01-04 – 2023-01-06 (×4): 80 mg via SUBCUTANEOUS
  Filled 2023-01-04 (×4): qty 0.8

## 2023-01-04 MED ORDER — LIDOCAINE 5 % EX PTCH
3.0000 | MEDICATED_PATCH | Freq: Every day | CUTANEOUS | Status: DC
  Administered 2023-01-04: 3 via TRANSDERMAL
  Administered 2023-01-05: 1 via TRANSDERMAL
  Filled 2023-01-04 (×3): qty 3

## 2023-01-04 NOTE — Progress Notes (Signed)
ANTICOAGULATION CONSULT NOTE  Pharmacy Consult for heparin  Indication: chest pain/ACS  Allergies  Allergen Reactions   Bee Venom Shortness Of Breath and Swelling   Codeine Phosphate Hives, Shortness Of Breath, Itching, Rash and Other (See Comments)    Tolerated multiple doses of IV morphine   Metoprolol Tartrate Other (See Comments)    loss of vision. Can take the XL tablet not regular    Pramipexole Other (See Comments)    Hallucinations     Tramadol     Pt has hallucinations    Ramipril Rash and Cough    Patient Measurements: Height: 5' 6.5" (168.9 cm) Weight: 83.9 kg (184 lb 14.4 oz) IBW/kg (Calculated) : 64.95 Heparin Dosing Weight: 82.7kg   Vital Signs: Temp: 98.4 F (36.9 C) (06/25 0738) Temp Source: Oral (06/25 0738) BP: 102/60 (06/25 0738) Pulse Rate: 90 (06/25 0738)  Labs: Recent Labs    01/01/23 1728 01/02/23 0658 01/02/23 0658 01/03/23 0306 01/03/23 2006 01/03/23 2227 01/04/23 0157  HGB  --  12.3*   < > 12.2* 13.9  --  13.0  HCT  --  37.0*   < > 36.9* 42.3  --  39.1  PLT  --  171   < > 183 188  --  184  LABPROT  --  14.6  --   --   --   --   --   INR  --  1.1  --   --   --   --   --   HEPARINUNFRC 0.38 0.46  --  0.50  --   --   --   CREATININE  --  0.80   < > 0.75 0.88  --  0.88  TROPONINIHS  --   --   --   --  776* 3,684*  --    < > = values in this interval not displayed.     Estimated Creatinine Clearance: 71 mL/min (by C-G formula based on SCr of 0.88 mg/dL).   Medical History: Past Medical History:  Diagnosis Date   Acute renal failure (HCC) 02/09/2013   Aortic stenosis 08/25/2010   a. Bentall aortic root replacement with a St. Jude mechanical valve and Hemashield conduit 02/2004.   Arthritis    Asthma    AV BLOCK, COMPLETE    a. s/p St Jude dual chamber pacemaker 02/2004.   CAD (coronary artery disease)    a. s/p CABGx2 (LIMA-dLAD, SVG-Cx). b. Low risk nuc 12/2012 without ischemia, EF 46% mild apical hypokinesia (EF 55% inf HK by echo).    Carotid artery disease (HCC)    a. 0-39% bilateral ICA stenosis, stable mild hard plaque in carotid bulbs. F/u 03/2014 recommended.   Diabetes mellitus without complication (HCC)    type 2    DIVERTICULOSIS, COLON 10/17/2007   Ejection fraction    a. EF 55% with inf HK, mild MR by echo 12/2012.   GERD (gastroesophageal reflux disease)    hxof    GOUT 01/16/2007   Headache    hx of migraines    HEMORRHOIDS, INTERNAL 11/01/2008   Hx of adenomatous polyp of colon 02/2019   no recall needed   HYPERLIPIDEMIA 01/16/2007   HYPERTENSION 01/16/2007   Impaired glucose tolerance    LEG EDEMA 11/27/2008   Special screening for malignant neoplasm of prostate    Warfarin anticoagulation    AVR    Assessment: Patient admitted with CC of chest pain. Patient with extensive cardiac history on warfarin for mechanical aortic valve.  Last dose 6/21. Heparin drip started once INR dropped with plans for cath and resumed post/cath.  Home warfarin dose is 5mg  Tues/Thurs/Sun, 10mg  AODs.  Discussed with cardiology - will switch heparin infusion to enoxaparin injections and resume warfarin tonight.   Goal of Therapy:  INR goal 2-3 per outpatient notes  Heparin level 0.3-0.7 units/ml Monitor platelets by anticoagulation protocol: Yes   Plan:  Stop heparin Warfarin 12mg  x1 tonight Enoxaparin 80mg  SQ BID Daily INR   Fredonia Highland, PharmD, BCPS, [facility] Clinical Pharmacist [phone] Please check AMION for all [facility] Pharmacy numbers 01/04/2023

## 2023-01-04 NOTE — Progress Notes (Signed)
PROGRESS NOTE    [name]  IEP:[id number] DOB: 05-18-1946 DOA: 12/31/2022 PCP: [name], NP   Brief Narrative:  This is a 77 year old man medical history significant for CAD s/p CABG in 2015, multiple cath most recent on 07/28/2021, aortic stenosis s/p aortic root replacement with St Jude mechanical AVR, complete heart block s/p pacemaker placement, hypertension, hyperlipidemia, non-insulin-dependent diabetes mellitus type 2 and Parkinson disease presented to emergency department at [facility] with complaining of several days of chest pain worse with exertion but ongoing even at rest, not subsiding with sublingual nitroglycerin at home.  Assessment & Plan:   Principal Problem:   Unstable angina (HCC) Active Problems:   CAD (coronary artery disease), autologous vein bypass graft   HTN (hypertension)   History of permanent cardiac pacemaker placement   History of transcatheter aortic valve replacement (TAVR)   HLD (hyperlipidemia)   DM type 2 (diabetes mellitus, type 2) (HCC)   Parkinson disease  Acute unstable angina (HCC), resolved -Patient loaded with 81 mg aspirin -Cardiac catheterization 6/24 indicative of multivessel disease requiring drug-eluting stent placement (see cath report for full details) -Echocardiogram completed, EF 50-55% notable left ventricular wall motion abnormality, dilation with indeterminate diastolic parameters and noted hypokinesis in multiple walls of left ventricle. -Dual antiplatelet therapy recommended indefinitely, cardiology recommend watching overnight for transition from heparin back to patient's Coumadin -we discussed possibly transitioning to Lovenox at home but patient states this has been cost prohibitive in the past.    Hypertension, well-controlled -Continue metoprolol, hold losartan -PRN hydralazine   Heart failure, grade 1 diastolic dysfunction, not in acute exacerbation -Most recent echo November 2023 preserved  EF -Continue metoprolol, ARB held  CAD (coronary artery disease), autologous vein bypass graft - Continue Toprol-XL 50 mg daily, Lipitor 80 mg daily and aspirin. - Continue tele   Parkinson disease - Appears to be at baseline per family at bedside - Carbidopa levodopa 50/200 mg at bedtime.   DM type 2 (diabetes mellitus, type 2) (HCC), well-controlled -Continue diabetic diet -A1c 6.4. -Continue sliding scale, hypoglycemic protocol   HLD (hyperlipidemia) - Continue Lipitor 80 mg daily  DVT prophylaxis: SCDs Start: 01/01/23 0141 warfarin (COUMADIN) tablet 12 mg Code Status:   Code Status: Full Code Family Communication: Sister at bedside - wife admitted to hospital recently  Status is: Inpt  Dispo: The patient is from: Home              Anticipated d/c is to: Home              Anticipated d/c date is: 24-48h              Patient currently NOT medically stable for discharge, awaiting cardiac evaluation and catheterization  Consultants:  Cardiology  Procedures:  Cardiac cath planned 6/24  Antimicrobials:  None  Subjective: No acute issues or events overnight, chest pain resolving. Denies symptoms with exertion today.  Otherwise denies nausea vomiting diarrhea constipation headache fevers chills shortness of breath.  Objective: Vitals:   01/04/23 0308 01/04/23 0417 01/04/23 0431 01/04/23 0738  BP: 107/61 109/62  102/60  Pulse: 80 76  90  Resp:  16  16  Temp:  (!) 97.4 F (36.3 C)  98.4 F (36.9 C)  TempSrc:  Oral  Oral  SpO2: 97% 97%  95%  Weight:   83.9 kg   Height:        Intake/Output Summary (Last 24 hours) at 01/04/2023 1519 Last data filed at 01/04/2023 0650 Gross per  24 hour  Intake 1135.06 ml  Output 1900 ml  Net -764.94 ml    Filed Weights   01/02/23 0500 01/03/23 0440 01/04/23 0431  Weight: 85 kg 84.6 kg 83.9 kg    Examination: General:  Pleasantly resting in bed, No acute distress. HEENT:  Normocephalic atraumatic.  Sclerae nonicteric,  noninjected.  Extraocular movements intact bilaterally. Neck:  Without mass or deformity.  Trachea is midline. Lungs:  Clear to auscultate bilaterally without rhonchi, wheeze, or rales. Heart: Holosystolic murmur PMI left sternal border without rubs or gallops. Abdomen:  Soft, nontender, nondistended.  Without guarding or rebound. Extremities: Without cyanosis, clubbing, edema, or obvious deformity. Skin:  Warm and dry, no erythema  Data Reviewed: I have personally reviewed following labs and imaging studies  CBC: Recent Labs  Lab 01/01/23 0804 01/02/23 0658 01/03/23 0306 01/03/23 2006 01/04/23 0157  WBC 7.3 8.4 7.5 17.5* 13.6*  HGB 13.1 12.3* 12.2* 13.9 13.0  HCT 39.5 37.0* 36.9* 42.3 39.1  MCV 92.1 90.9 90.7 94.0 91.1  PLT 170 171 183 188 184    Basic Metabolic Panel: Recent Labs  Lab 12/31/22 2141 01/01/23 0804 01/02/23 0658 01/03/23 0306 01/03/23 2006 01/04/23 0157  NA 138 139 137 135  --  136  K 3.9 4.3 3.6 3.8  --  3.6  CL 102 105 105 104  --  104  CO2 30 26 24 25   --  22  GLUCOSE 145* 102* 94 101*  --  101*  BUN 17 15 13 14   --  12  CREATININE 0.85 0.74 0.80 0.75 0.88 0.88  CALCIUM 9.7 8.7* 8.5* 8.5*  --  8.3*    GFR: Estimated Creatinine Clearance: 71 mL/min (by C-G formula based on SCr of 0.88 mg/dL).  Coagulation Profile: Recent Labs  Lab 12/31/22 2141 01/01/23 0804 01/02/23 0658  INR 1.9* 2.0* 1.1    No results found for this or any previous visit (from the past 240 hour(s)).   Radiology Studies: DG CHEST PORT 1 VIEW  Result Date: 01/03/2023 CLINICAL DATA:  Shortness of breath. EXAM: PORTABLE CHEST 1 VIEW COMPARISON:  Chest radiograph dated 12/31/2022. FINDINGS: No focal consolidation, pleural effusion, or pneumothorax. The cardiac silhouette is within normal limits. Median sternotomy wires and mechanical cardiac valve. Right pectoral pacemaker device. No acute osseous pathology. IMPRESSION: No active disease. Electronically Signed   By: Elgie Collard M.D.   On: 01/03/2023 20:43   CARDIAC CATHETERIZATION  Result Date: 01/03/2023   Mid LAD lesion is 100% stenosed.   Prox LAD to Mid LAD lesion is 90% stenosed.   Origin to Prox Graft lesion is 100% stenosed.   Prox RCA to Mid RCA lesion is 35% stenosed.   2nd Mrg-2 lesion is 25% stenosed.   Mid Cx lesion is 90% stenosed.   Prox Cx to Mid Cx lesion is 90% stenosed.   Non-stenotic Mid LM to Ost LAD lesion was previously treated.   Non-stenotic 2nd Mrg-1 lesion was previously treated.   A drug-eluting stent was successfully placed using a SYNERGY XD 3.0X28.   Post intervention, there is a 0% residual stenosis.   Post intervention, there is a 0% residual stenosis.   Recommend dual antiplatelet therapy with Aspirin 81mg  daily and Clopidogrel 75mg  daily long-term (beyond 12 months) because of multiple stents. 2 vessel obstructive CAD. 100% LAD. This fills by LIMA. Patent left main stent. Focal severe in stent restenosis of the proximal LCx prior to OM1. 90% new stenosis in mid LCx after OM1. Known  occlusion of SVG to LCx Patent LIMA to LAD Successful PCI of the proximal to mid LCx with DES across the first OM essentially resulting in bifurcation stenting with prior stents. Plan: DAPT indefinitely. Possible DC tomorrow depending on clinical course   Scheduled Meds:  aspirin  81 mg Oral Daily   atorvastatin  80 mg Oral Daily   benzonatate  100 mg Oral BID   carbidopa-levodopa  1 tablet Oral QHS   clopidogrel  75 mg Oral Daily   enoxaparin (LOVENOX) injection  80 mg Subcutaneous Q12H   furosemide  40 mg Oral Daily   guaiFENesin  600 mg Oral BID   isosorbide mononitrate  15 mg Oral Daily   lidocaine  3 patch Transdermal Daily   melatonin  5 mg Oral QHS   metoprolol succinate  50 mg Oral Daily   sodium chloride flush  3 mL Intravenous Q12H   sodium chloride flush  3 mL Intravenous Q12H   warfarin  12 mg Oral ONCE-1600   Warfarin - Pharmacist Dosing Inpatient   Does not apply q1600   Continuous  Infusions:  sodium chloride       LOS: 3 days   Time spent:  Azucena Fallen, DO [facility]  If 7PM-7AM, please contact night-coverage [email or web address]  01/04/2023, 3:19 PM

## 2023-01-04 NOTE — Progress Notes (Signed)
CARDIAC REHAB PHASE I   PRE:  Rate/Rhythm: 75 paced   BP:  Sitting: 122/66      SaO2: 96 RA  MODE:  Ambulation: 100 ft   POST:  Rate/Rhythm: 87  BP:  Sitting: 142/97      SaO2: 95 RA  Pt resting in bed, feeling well today. Ambulated in hall with steady assist and pt holding/pushing IV pole. Tolerated well with no CP,dizziness and mild SOB(similar to baseline). Returned to bed with call bell and bedside table in reach. Post stent education including site care, restrictions, risk factors, antiplatelet therapy importance, exercise guidelines, NTG use, heart healthy diabetic diet and CRP2 reviewed. All questions and concerns addressed. Will refer to AP for CRP2. Will continue to follow.   [id number]    Woodroe Chen, RN BSN 01/04/2023 11:11 AM

## 2023-01-04 NOTE — Plan of Care (Signed)
  Problem: Education: Goal: Knowledge of General Education information will improve Description: Including pain rating scale, medication(s)/side effects and non-pharmacologic comfort measures Outcome: Progressing   Problem: Clinical Measurements: Goal: Ability to maintain clinical measurements within normal limits will improve Outcome: Progressing   Problem: Health Behavior/Discharge Planning: Goal: Ability to manage health-related needs will improve Outcome: Progressing   Problem: Clinical Measurements: Goal: Will remain free from infection Outcome: Progressing   Problem: Clinical Measurements: Goal: Respiratory complications will improve Outcome: Progressing   Problem: Clinical Measurements: Goal: Cardiovascular complication will be avoided Outcome: Progressing   Problem: Activity: Goal: Risk for activity intolerance will decrease Outcome: Progressing   Problem: Nutrition: Goal: Adequate nutrition will be maintained Outcome: Progressing   Problem: Education: Goal: Understanding of CV disease, CV risk reduction, and recovery process will improve Outcome: Progressing

## 2023-01-04 NOTE — Progress Notes (Addendum)
Patient Name: [name] Date of Encounter: 01/04/2023 [facility] Cardiologist: Donato Schultz, MD   Interval Summary  .    77 y.o. male with CAD (s/p CABGx2 in 2005 with LIMA-LAD and SVG-LCx, NSTEMI 05/2022 s/p successful PCI of the left main, proximal LCx, OM to the mid vessel incorporating orbital atherectomy, Shockwave balloon lithotripsy and DES x 2 in overlapping fashion), aortic stenosis (s/p Bentall aortic root replacement with St. Jude mechanical valve), CHB (s/p PPM placement), ascending thoracic aortic aneurysm, Parkinson's disease, HTN, HLD and Type 2 DM admitted with chest pain.  Cardiac cath 01/03/23.   Today no further pain or SOB feels much better.   Vital Signs .    Vitals:   01/04/23 0308 01/04/23 0417 01/04/23 0431 01/04/23 0738  BP: 107/61 109/62  102/60  Pulse: 80 76  90  Resp:  16  16  Temp:  (!) 97.4 F (36.3 C)  98.4 F (36.9 C)  TempSrc:  Oral  Oral  SpO2: 97% 97%  95%  Weight:   83.9 kg   Height:        Intake/Output Summary (Last 24 hours) at 01/04/2023 1030 Last data filed at 01/04/2023 0650 Gross per 24 hour  Intake 1135.06 ml  Output 1900 ml  Net -764.94 ml      01/04/2023    4:31 AM 01/03/2023    4:40 AM 01/02/2023    5:00 AM  Last 3 Weights  Weight (lbs) 184 lb 14.4 oz 186 lb 6.4 oz 187 lb 4.8 oz  Weight (kg) 83.87 kg 84.55 kg 84.959 kg      Telemetry/ECG    SR occ PVC - Personally Reviewed  EKG  SR with V paced 1st degree AV block PR 250 ms  Cardiac cath 01/03/23    Mid LAD lesion is 100% stenosed.   Prox LAD to Mid LAD lesion is 90% stenosed.   Origin to Prox Graft lesion is 100% stenosed.   Prox RCA to Mid RCA lesion is 35% stenosed.   2nd Mrg-2 lesion is 25% stenosed.   Mid Cx lesion is 90% stenosed.   Prox Cx to Mid Cx lesion is 90% stenosed.   Non-stenotic Mid LM to Ost LAD lesion was previously treated.   Non-stenotic 2nd Mrg-1 lesion was previously treated.   A drug-eluting stent was successfully placed  using a SYNERGY XD 3.0X28.   Post intervention, there is a 0% residual stenosis.   Post intervention, there is a 0% residual stenosis.   Recommend dual antiplatelet therapy with Aspirin 81mg  daily and Clopidogrel 75mg  daily long-term (beyond 12 months) because of multiple stents.   2 vessel obstructive CAD. 100% LAD. This fills by LIMA. Patent left main stent. Focal severe in stent restenosis of the proximal LCx prior to OM1. 90% new stenosis in mid LCx after OM1. Known occlusion of SVG to LCx Patent LIMA to LAD Successful PCI of the proximal to mid LCx with DES across the first OM essentially resulting in bifurcation stenting with prior stents.    Plan: DAPT indefinitely. Possible DC tomorrow depending on clinical course  Diagnostic Dominance: Right  Intervention       Physical Exam .   GEN: No acute distress.   Neck: No JVD Cardiac: RRR, no murmurs, rubs, or gallops.  Respiratory: Clear to auscultation bilaterally. GI: Soft, nontender, non-distended  MS: No edema  Assessment & Plan .     Chest pain, prior h/o CAD with prior CABG 2005 and last  NSTEMI 05/2022 s/p PCI as above/post procedure complications - hsTroponin low/flat at 17->20  now post procedure and chest pain 776-->3,684 - continue ASA, Plavix (on Plavix PTA given concomitant Coumadin) - continue atorvastatin - continue metoprolol - continue IV heparin though if no further procedures change to lovenox - cath yesterday - Successful PCI of the proximal to mid LCx with DES across the first OM essentially resulting in bifurcation stenting with prior stents --developed chest pain and dyspnea post procedure.  This was similar to pre cath.  Sp02 was decreased.  +3322 since admit.  IV lasix given and PCXR stable NAD  (neg 631 today and since admit +1693) - may benefit from addition of long acting nitrate given frequency of SL NTG- BP is labile will hold adding for now   Acute HFpEF  EF 50-55% post cath  --BNP 210 last  pm --CXR stable given IV lasix. Was neg 631 for today but since admit is +1693  few fine rales  --resume home lasix 40 mg daily.   H/o aortic stenosis (s/p Bentall aortic root replacement 2005 with St. Jude mechanical valve) with ascending thoracic aortic aneurysm  --coumadin held  --if no plans for recheck with cath with elevated hs troponin, may be from procedure alone will change to lovenox coumadin crossover   CHB s/p PPM placement - continue OP EP f/u, overdue, last OV 2022, will need to get back on track as OP   HTN - mildly elevated BP noted,  ?resume home losartan - was held in case of cath but has normal renal function resume today vs tomorrow with labile BP - continue metoprolol otherwise  Mild anemia and R eye injection per IM    For questions or updates, please contact [facility] Please consult [email or web address] for contact info under        Signed, Nada Boozer, NP   Personally seen and examined. Agree with APP above with the following comments: 77 yo M with Parkinsons and prior CABG with chest pain. Post procedure, has worsening CP and SOB: given IV lasix. Today CP has resolved and SOB is resolved.   Exam notable for crisp heart sounds.  Parkinsonian UE movement.  No femoral hematoma or bruit.  Clear to auscultation.  Rest as above. Tele: V pacing; 1st HB noted Would recommend  - His on on triple therapy; heparin-> Lovenox; he needs f/u within a month to decide on de-escalation to plavix coumadin given his anemia but his complex CAD - Starting home lasix and holding home ARB - adding low dose imdur 15 mg PO daily - if doing well tomorrow; we could consider discharge with lovenox and coumadin clinic follow up  Riley Lam, MD FASE [facility] Cardiologist [facility]  [address], [address] [place], [place] [address] [phone]  12:26 PM

## 2023-01-04 NOTE — Progress Notes (Signed)
Pt reported midsternal CP/SOB with wheezes and rales upon returning from cath lab at change of shift.  Also noted to be on oxygen at 3 liters with sats 90-92%.  Nebulizer given by previous Charity fundraiser.  IVF KVO'd and Ward Givens, NP notified with lasix ordered and given.  Pt  has diuresed > 1.5 L since lasix administered.  Trops post PCI 776, [id number].  No c/o CP at present, pt reported 8/10 left side/back pain that he states started after being moved to cath table and has persisted.  No masses or bruising appreciated.  PRN medication administered and Cardiology notified of post PCI trops and reports of pain.  New orders received.  Will continue to monitor closely.

## 2023-01-04 NOTE — TOC Benefit Eligibility Note (Signed)
Pharmacy Patient Advocate Encounter  Insurance verification completed.    The patient is insured through [facility] Medicare Part D  Ran test claim for enoxaparin (Lovenox) 80 mg/0.8 ml inj and the current 30 day co-pay is $95.00.   This test claim was processed through [facility]- copay amounts may vary at other pharmacies due to pharmacy/plan contracts, or as the patient moves through the different stages of their insurance plan.    Roland Earl, CPHT Pharmacy Patient Advocate Specialist [facility] Patient Advocate Team Direct Number: [phone]  Fax: [phone]

## 2023-01-05 DIAGNOSIS — I200 Unstable angina: Secondary | ICD-10-CM | POA: Diagnosis not present

## 2023-01-05 LAB — BASIC METABOLIC PANEL
Anion gap: 6 (ref 5–15)
BUN: 14 mg/dL (ref 8–23)
CO2: 25 mmol/L (ref 22–32)
Calcium: 8.2 mg/dL — ABNORMAL LOW (ref 8.9–10.3)
Chloride: 103 mmol/L (ref 98–111)
Creatinine, Ser: 0.85 mg/dL (ref 0.61–1.24)
GFR, Estimated: >60 mL/min (ref >60)
Glucose, Bld: 103 mg/dL — ABNORMAL HIGH (ref 70–99)
Potassium: 3.5 mmol/L (ref 3.5–5.1)
Sodium: 134 mmol/L — ABNORMAL LOW (ref 135–145)

## 2023-01-05 LAB — CBC
HCT: 34.3 % — ABNORMAL LOW (ref 39.0–52.0)
Hemoglobin: 11.4 g/dL — ABNORMAL LOW (ref 13.0–17.0)
MCH: 30.3 pg (ref 26.0–34.0)
MCHC: 33.2 g/dL (ref 30.0–36.0)
MCV: 91.2 fL (ref 80.0–100.0)
Platelets: 164 10*3/uL (ref 150–400)
RBC: 3.76 MIL/uL — ABNORMAL LOW (ref 4.22–5.81)
RDW: 15 % (ref 11.5–15.5)
WBC: 8.3 10*3/uL (ref 4.0–10.5)
nRBC: 0 % (ref 0.0–0.2)

## 2023-01-05 LAB — PROTIME-INR
INR: 1.8 — ABNORMAL HIGH (ref 0.8–1.2)
Prothrombin Time: 21.2 seconds — ABNORMAL HIGH (ref 11.4–15.2)

## 2023-01-05 MED ORDER — ONDANSETRON HCL 4 MG/2ML IJ SOLN: 4.0000 mg | Freq: Four times a day (QID) | INTRAMUSCULAR | Status: DC | PRN

## 2023-01-05 MED ORDER — HYDRALAZINE HCL 20 MG/ML IJ SOLN: 10.0000 mg | INTRAMUSCULAR | Status: DC | PRN

## 2023-01-05 MED ORDER — WARFARIN SODIUM 5 MG PO TABS
10.0000 mg | ORAL_TABLET | Freq: Once | ORAL | Status: AC
  Administered 2023-01-05: 10 mg via ORAL
  Filled 2023-01-05: qty 2

## 2023-01-05 MED ORDER — SENNOSIDES-DOCUSATE SODIUM 8.6-50 MG PO TABS: 1.0000 | ORAL_TABLET | Freq: Every evening | ORAL | Status: DC | PRN

## 2023-01-05 MED ORDER — METOPROLOL TARTRATE 5 MG/5ML IV SOLN: 5.0000 mg | INTRAVENOUS | Status: DC | PRN

## 2023-01-05 MED ORDER — GUAIFENESIN 100 MG/5ML PO LIQD: 5.0000 mL | ORAL | Status: DC | PRN

## 2023-01-05 MED ORDER — TRAZODONE HCL 50 MG PO TABS: 50.0000 mg | ORAL_TABLET | Freq: Every evening | ORAL | Status: DC | PRN

## 2023-01-05 NOTE — Progress Notes (Signed)
CARDIAC REHAB PHASE I   Pt has just completed walk in hallway with sister, ambulating independently. Pt reports tolerating well with no CP, SOB or dizziness. Reviewed education provided yesterday. No questions or concerns. Awaiting goal INR prior to discharge home. Pt is eager to go home as soon as possible. Will continue to follow.   [id number] Woodroe Chen, RN BSN 01/05/2023 10:51 AM

## 2023-01-05 NOTE — Care Management Important Message (Signed)
Important Message  Patient Details  Name: [name] MRN: [id number] Date of Birth: [birth year]   Medicare Important Message Given:  Yes     Renie Ora 01/05/2023, 8:14 AM

## 2023-01-05 NOTE — Plan of Care (Signed)
  Problem: Education: Goal: Knowledge of General Education information will improve Description: Including pain rating scale, medication(s)/side effects and non-pharmacologic comfort measures Outcome: Progressing   Problem: Health Behavior/Discharge Planning: Goal: Ability to manage health-related needs will improve Outcome: Progressing   Problem: Clinical Measurements: Goal: Ability to maintain clinical measurements within normal limits will improve Outcome: Progressing   Problem: Clinical Measurements: Goal: Will remain free from infection Outcome: Progressing   Problem: Clinical Measurements: Goal: Cardiovascular complication will be avoided Outcome: Progressing   Problem: Activity: Goal: Risk for activity intolerance will decrease Outcome: Progressing   Problem: Safety: Goal: Ability to remain free from injury will improve Outcome: Progressing   Problem: Education: Goal: Understanding of CV disease, CV risk reduction, and recovery process will improve Outcome: Progressing   Problem: Cardiovascular: Goal: Vascular access site(s) Level 0-1 will be maintained Outcome: Progressing

## 2023-01-05 NOTE — Progress Notes (Signed)
PROGRESS NOTE    [name]  JOA:[id number] DOB: [birth year] DOA: 12/31/2022 PCP: [name], NP   Brief Narrative:  77 year old man medical history significant for CAD s/p CABG in 2015, multiple cath most recent on 07/28/2021, aortic stenosis s/p aortic root replacement with St Jude mechanical AVR, complete heart block s/p pacemaker placement, hypertension, hyperlipidemia, non-insulin-dependent diabetes mellitus type 2 and Parkinson disease presented to emergency department at [facility] with complaining of several days of chest pain worse with exertion but ongoing even at rest, not subsiding with sublingual nitroglycerin at home.    Assessment & Plan:  Principal Problem:   Unstable angina (HCC) Active Problems:   CAD (coronary artery disease), autologous vein bypass graft   HTN (hypertension)   History of permanent cardiac pacemaker placement   History of transcatheter aortic valve replacement (TAVR)   HLD (hyperlipidemia)   DM type 2 (diabetes mellitus, type 2) (HCC)   Parkinson disease      Acute unstable angina (HCC), resolved Currently chest pain-free.  Status post left circumflex intervention.  Seen by cardiology, currently planning on dual antiplatelet therapy-aspirin and Plavix.  Also getting Lovenox to Coumadin bridge.  -Cardiac catheterization 6/24 indicative of multivessel disease requiring drug-eluting stent placement  -Echocardiogram completed, EF 50-55% notable left ventricular wall motion abnormality, dilation with indeterminate diastolic parameters and noted hypokinesis in multiple walls of left ventricle.     Hypertension, well-controlled On Toprol XL, Losartan on Hold IV prn   Heart failure, grade 1 diastolic dysfunction, not in acute exacerbation; ef 55% -Most recent echo November 2023 preserved EF -Continue metoprolol, ARB held   CAD (coronary artery disease), autologous vein bypass graft - Continue Toprol-XL 50 mg daily, Lipitor 80  mg daily and aspirin. - Continue tele   Parkinson disease - Appears to be at baseline per family at bedside - Carbidopa levodopa 50/200 mg at bedtime.   DM type 2 (diabetes mellitus, type 2) (HCC), well-controlled -Continue diabetic diet -A1c 6.4. -Continue sliding scale, hypoglycemic protocol   HLD (hyperlipidemia) - Continue Lipitor 80 mg daily   DVT prophylaxis: Coumadin/Lovenox  Code Status:   Code Status: Full Code Family Communication: Family at bedside sleeping   Status is: Inpt   Dispo: The patient is from: Home              Anticipated d/c is to: Home              Anticipated d/c date is: 24-48h              Pending therapeutic INR. Unable to afford lovenox.       Diet Orders (From admission, onward)     Start     Ordered   01/03/23 1930  Diet Heart Room service appropriate? Yes; Fluid consistency: Thin  Diet effective now       Question Answer Comment  Room service appropriate? Yes   Fluid consistency: Thin      01/03/23 1930            Subjective: Patient has not any complaints at this time.   Examination:  General exam: Appears calm and comfortable  Respiratory system: Clear to auscultation. Respiratory effort normal. Cardiovascular system: S1 & S2 heard, RRR. No JVD, murmurs, rubs, gallops or clicks. No pedal edema. Gastrointestinal system: Abdomen is nondistended, soft and nontender. No organomegaly or masses felt. Normal bowel sounds heard. Central nervous system: Alert and oriented. No focal neurological deficits. Extremities: Symmetric 5 x 5 power. Skin: No  rashes, lesions or ulcers Psychiatry: Judgement and insight appear normal. Mood & affect appropriate.  Objective: Vitals:   01/04/23 1817 01/05/23 0034 01/05/23 0552 01/05/23 0822  BP: (!) 112/54 115/63 136/70 123/65  Pulse: 85 76 72 76  Resp: 16 16 16 18   Temp: 97.6 F (36.4 C) 97.9 F (36.6 C) 98 F (36.7 C) 98.3 F (36.8 C)  TempSrc: Oral Oral Oral Oral  SpO2: 90% 94% 100%  93%  Weight:      Height:       No intake or output data in the 24 hours ending 01/05/23 0936 Filed Weights   01/02/23 0500 01/03/23 0440 01/04/23 0431  Weight: 85 kg 84.6 kg 83.9 kg    Scheduled Meds:  aspirin  81 mg Oral Daily   atorvastatin  80 mg Oral Daily   benzonatate  100 mg Oral BID   carbidopa-levodopa  1 tablet Oral QHS   clopidogrel  75 mg Oral Daily   enoxaparin (LOVENOX) injection  80 mg Subcutaneous Q12H   furosemide  40 mg Oral Daily   guaiFENesin  600 mg Oral BID   isosorbide mononitrate  15 mg Oral Daily   lidocaine  3 patch Transdermal Daily   melatonin  5 mg Oral QHS   metoprolol succinate  50 mg Oral Daily   sodium chloride flush  3 mL Intravenous Q12H   sodium chloride flush  3 mL Intravenous Q12H   warfarin  10 mg Oral ONCE-1600   Warfarin - Pharmacist Dosing Inpatient   Does not apply q1600   Continuous Infusions:  sodium chloride      Nutritional status     Body mass index is 29.4 kg/m.  Data Reviewed:   CBC: Recent Labs  Lab 01/02/23 0658 01/03/23 0306 01/03/23 2006 01/04/23 0157 01/05/23 0246  WBC 8.4 7.5 17.5* 13.6* 8.3  HGB 12.3* 12.2* 13.9 13.0 11.4*  HCT 37.0* 36.9* 42.3 39.1 34.3*  MCV 90.9 90.7 94.0 91.1 91.2  PLT 171 183 188 184 164   Basic Metabolic Panel: Recent Labs  Lab 01/01/23 0804 01/02/23 0658 01/03/23 0306 01/03/23 2006 01/04/23 0157 01/05/23 0246  NA 139 137 135  --  136 134*  K 4.3 3.6 3.8  --  3.6 3.5  CL 105 105 104  --  104 103  CO2 26 24 25   --  22 25  GLUCOSE 102* 94 101*  --  101* 103*  BUN 15 13 14   --  12 14  CREATININE 0.74 0.80 0.75 0.88 0.88 0.85  CALCIUM 8.7* 8.5* 8.5*  --  8.3* 8.2*   GFR: Estimated Creatinine Clearance: 73.5 mL/min (by C-G formula based on SCr of 0.85 mg/dL). Liver Function Tests: Recent Labs  Lab 01/01/23 0804  AST 33  ALT 22  ALKPHOS 38  BILITOT 1.2  PROT 5.4*  ALBUMIN 3.4*   No results for input(s): "LIPASE", "AMYLASE" in the last 168 hours. No results  for input(s): "AMMONIA" in the last 168 hours. Coagulation Profile: Recent Labs  Lab 12/31/22 2141 01/01/23 0804 01/02/23 0658 01/05/23 0246  INR 1.9* 2.0* 1.1 1.8*   Cardiac Enzymes: No results for input(s): "CKTOTAL", "CKMB", "CKMBINDEX", "TROPONINI" in the last 168 hours. BNP (last 3 results) No results for input(s): "PROBNP" in the last 8760 hours. HbA1C: No results for input(s): "HGBA1C" in the last 72 hours. CBG: No results for input(s): "GLUCAP" in the last 168 hours. Lipid Profile: No results for input(s): "CHOL", "HDL", "LDLCALC", "TRIG", "CHOLHDL", "LDLDIRECT" in the last  72 hours. Thyroid Function Tests: No results for input(s): "TSH", "T4TOTAL", "FREET4", "T3FREE", "THYROIDAB" in the last 72 hours. Anemia Panel: No results for input(s): "VITAMINB12", "FOLATE", "FERRITIN", "TIBC", "IRON", "RETICCTPCT" in the last 72 hours. Sepsis Labs: No results for input(s): "PROCALCITON", "LATICACIDVEN" in the last 168 hours.  No results found for this or any previous visit (from the past 240 hour(s)).       Radiology Studies: DG CHEST PORT 1 VIEW  Result Date: 01/03/2023 CLINICAL DATA:  Shortness of breath. EXAM: PORTABLE CHEST 1 VIEW COMPARISON:  Chest radiograph dated 12/31/2022. FINDINGS: No focal consolidation, pleural effusion, or pneumothorax. The cardiac silhouette is within normal limits. Median sternotomy wires and mechanical cardiac valve. Right pectoral pacemaker device. No acute osseous pathology. IMPRESSION: No active disease. Electronically Signed   By: Elgie Collard M.D.   On: 01/03/2023 20:43   CARDIAC CATHETERIZATION  Result Date: 01/03/2023   Mid LAD lesion is 100% stenosed.   Prox LAD to Mid LAD lesion is 90% stenosed.   Origin to Prox Graft lesion is 100% stenosed.   Prox RCA to Mid RCA lesion is 35% stenosed.   2nd Mrg-2 lesion is 25% stenosed.   Mid Cx lesion is 90% stenosed.   Prox Cx to Mid Cx lesion is 90% stenosed.   Non-stenotic Mid LM to Ost LAD  lesion was previously treated.   Non-stenotic 2nd Mrg-1 lesion was previously treated.   A drug-eluting stent was successfully placed using a SYNERGY XD 3.0X28.   Post intervention, there is a 0% residual stenosis.   Post intervention, there is a 0% residual stenosis.   Recommend dual antiplatelet therapy with Aspirin 81mg  daily and Clopidogrel 75mg  daily long-term (beyond 12 months) because of multiple stents. 2 vessel obstructive CAD. 100% LAD. This fills by LIMA. Patent left main stent. Focal severe in stent restenosis of the proximal LCx prior to OM1. 90% new stenosis in mid LCx after OM1. Known occlusion of SVG to LCx Patent LIMA to LAD Successful PCI of the proximal to mid LCx with DES across the first OM essentially resulting in bifurcation stenting with prior stents. Plan: DAPT indefinitely. Possible DC tomorrow depending on clinical course          LOS: 4 days   Time spent= 35 mins    Laityn Bensen Joline Maxcy, MD [facility]  If 7PM-7AM, please contact night-coverage  01/05/2023, 9:36 AM

## 2023-01-05 NOTE — Progress Notes (Signed)
ANTICOAGULATION CONSULT NOTE  Pharmacy Consult for warfarin  Indication: chest pain/ACS  Allergies  Allergen Reactions   Bee Venom Shortness Of Breath and Swelling   Codeine Phosphate Hives, Shortness Of Breath, Itching, Rash and Other (See Comments)    Tolerated multiple doses of IV morphine   Metoprolol Tartrate Other (See Comments)    loss of vision. Can take the XL tablet not regular    Pramipexole Other (See Comments)    Hallucinations     Tramadol     Pt has hallucinations    Ramipril Rash and Cough    Patient Measurements: Height: 5' 6.5" (168.9 cm) Weight: 83.9 kg (184 lb 14.4 oz) IBW/kg (Calculated) : 64.95 Heparin Dosing Weight: 82.7kg   Vital Signs: Temp: 98 F (36.7 C) (06/26 0552) Temp Source: Oral (06/26 0552) BP: 136/70 (06/26 0552) Pulse Rate: 72 (06/26 0552)  Labs: Recent Labs    01/03/23 0306 01/03/23 2006 01/03/23 2227 01/04/23 0157 01/05/23 0246  HGB 12.2* 13.9  --  13.0 11.4*  HCT 36.9* 42.3  --  39.1 34.3*  PLT 183 188  --  184 164  LABPROT  --   --   --   --  21.2*  INR  --   --   --   --  1.8*  HEPARINUNFRC 0.50  --   --   --   --   CREATININE 0.75 0.88  --  0.88 0.85  TROPONINIHS  --  776* 3,684*  --   --      Estimated Creatinine Clearance: 73.5 mL/min (by C-G formula based on SCr of 0.85 mg/dL).   Medical History: Past Medical History:  Diagnosis Date   Acute renal failure (HCC) 02/09/2013   Aortic stenosis 08/25/2010   a. [place] aortic root replacement with a St. Jude mechanical valve and Hemashield conduit 02/2004.   Arthritis    Asthma    AV BLOCK, COMPLETE    a. s/p St Jude dual chamber pacemaker 02/2004.   CAD (coronary artery disease)    a. s/p CABGx2 (LIMA-dLAD, SVG-Cx). b. Low risk nuc 12/2012 without ischemia, EF 46% mild apical hypokinesia (EF 55% inf HK by echo).   Carotid artery disease (HCC)    a. 0-39% bilateral ICA stenosis, stable mild hard plaque in carotid bulbs. F/u 03/2014 recommended.   Diabetes mellitus  without complication (HCC)    type 2    DIVERTICULOSIS, COLON 10/17/2007   Ejection fraction    a. EF 55% with inf HK, mild MR by echo 12/2012.   GERD (gastroesophageal reflux disease)    hxof    GOUT 01/16/2007   Headache    hx of migraines    HEMORRHOIDS, INTERNAL 11/01/2008   Hx of adenomatous polyp of colon 02/2019   no recall needed   HYPERLIPIDEMIA 01/16/2007   HYPERTENSION 01/16/2007   Impaired glucose tolerance    LEG EDEMA 11/27/2008   Special screening for malignant neoplasm of prostate    Warfarin anticoagulation    AVR    Assessment: Patient admitted with CC of chest pain. Patient with extensive cardiac history on warfarin for mechanical aortic valve. Last dose 6/21. Heparin drip started once INR dropped with plans for cath and resumed post/cath.  Home warfarin dose is 5mg  Tues/Thurs/Sun, 10mg  AODs.  H/H down slightly, INR 1.8, Cr ok.   Goal of Therapy:  INR goal 2-3 per outpatient notes  Anti-Xa level 0.6-1 units/ml 4hrs after LMWH dose given Monitor platelets by anticoagulation protocol: Yes  Plan:  Warfarin 10mg  x1 tonight Enoxaparin 80mg  SQ BID Daily INR   Fredonia Highland, PharmD, [place], [facility] Clinical Pharmacist [phone] Please check AMION for all [facility] numbers 01/05/2023

## 2023-01-05 NOTE — Progress Notes (Signed)
Progress Note  Patient Name: Levaughn Puccinelli Horine Date of Encounter: 01/05/2023 Primary Cardiologist: Donato Schultz, MD   Subjective   Overnight no symptoms or hypotension. Patient notes that his Lovenox would be ~ $100 and is outside of what he could pay for  Vital Signs    Vitals:   01/04/23 1817 01/05/23 0034 01/05/23 0552 01/05/23 0822  BP: (!) 112/54 115/63 136/70 123/65  Pulse: 85 76 72 76  Resp: 16 16 16 18   Temp: 97.6 F (36.4 C) 97.9 F (36.6 C) 98 F (36.7 C) 98.3 F (36.8 C)  TempSrc: Oral Oral Oral Oral  SpO2: 90% 94% 100% 93%  Weight:      Height:       No intake or output data in the 24 hours ending 01/05/23 0856 Filed Weights   01/02/23 0500 01/03/23 0440 01/04/23 0431  Weight: 85 kg 84.6 kg 83.9 kg    Physical Exam   GEN: No acute distress.   Neck: No JVD Cardiac: RRR, no rubs, or gallops. Crisp heart sounds, no R fem hematoma Respiratory: Clear to auscultation bilaterally. GI: Soft, nontender, non-distended  MS: No edema Neuro: Rolling tremor  Labs   Telemetry: VP pacing with rare PVCs   Chemistry Recent Labs  Lab 01/01/23 0804 01/02/23 0658 01/03/23 0306 01/03/23 2006 01/04/23 0157 01/05/23 0246  NA 139   < > 135  --  136 134*  K 4.3   < > 3.8  --  3.6 3.5  CL 105   < > 104  --  104 103  CO2 26   < > 25  --  22 25  GLUCOSE 102*   < > 101*  --  101* 103*  BUN 15   < > 14  --  12 14  CREATININE 0.74   < > 0.75 0.88 0.88 0.85  CALCIUM 8.7*   < > 8.5*  --  8.3* 8.2*  PROT 5.4*  --   --   --   --   --   ALBUMIN 3.4*  --   --   --   --   --   AST 33  --   --   --   --   --   ALT 22  --   --   --   --   --   ALKPHOS 38  --   --   --   --   --   BILITOT 1.2  --   --   --   --   --   GFRNONAA >60   < > >60 >60 >60 >60  ANIONGAP 8   < > 6  --  10 6   < > = values in this interval not displayed.     Hematology Recent Labs  Lab 01/03/23 2006 01/04/23 0157 01/05/23 0246  WBC 17.5* 13.6* 8.3  RBC 4.50 4.29 3.76*  HGB 13.9 13.0 11.4*   HCT 42.3 39.1 34.3*  MCV 94.0 91.1 91.2  MCH 30.9 30.3 30.3  MCHC 32.9 33.2 33.2  RDW 14.7 15.0 15.0  PLT 188 184 164    Cardiac EnzymesNo results for input(s): "TROPONINI" in the last 168 hours. No results for input(s): "TROPIPOC" in the last 168 hours.   BNP Recent Labs  Lab 01/03/23 2021  BNP 210.8*     DDimer No results for input(s): "DDIMER" in the last 168 hours.   Cardiac Studies   Cardiac Studies & Procedures   CARDIAC  CATHETERIZATION  CARDIAC CATHETERIZATION 01/03/2023  Narrative   Mid LAD lesion is 100% stenosed.   Prox LAD to Mid LAD lesion is 90% stenosed.   Origin to Prox Graft lesion is 100% stenosed.   Prox RCA to Mid RCA lesion is 35% stenosed.   2nd Mrg-2 lesion is 25% stenosed.   Mid Cx lesion is 90% stenosed.   Prox Cx to Mid Cx lesion is 90% stenosed.   Non-stenotic Mid LM to Ost LAD lesion was previously treated.   Non-stenotic 2nd Mrg-1 lesion was previously treated.   A drug-eluting stent was successfully placed using a SYNERGY XD 3.0X28.   Post intervention, there is a 0% residual stenosis.   Post intervention, there is a 0% residual stenosis.   Recommend dual antiplatelet therapy with Aspirin 81mg  daily and Clopidogrel 75mg  daily long-term (beyond 12 months) because of multiple stents.  2 vessel obstructive CAD. 100% LAD. This fills by LIMA. Patent left main stent. Focal severe in stent restenosis of the proximal LCx prior to OM1. 90% new stenosis in mid LCx after OM1. Known occlusion of SVG to LCx Patent LIMA to LAD Successful PCI of the proximal to mid LCx with DES across the first OM essentially resulting in bifurcation stenting with prior stents.  Plan: DAPT indefinitely. Possible DC tomorrow depending on clinical course  Findings Coronary Findings Diagnostic  Dominance: Right  Left Main Non-stenotic Mid LM to Ost LAD lesion was previously treated. The lesion is severely calcified.  Left Anterior Descending Prox LAD to Mid LAD  lesion is 90% stenosed. The lesion is calcified. Mid LAD lesion is 100% stenosed.  Left Circumflex Prox Cx to Mid Cx lesion is 90% stenosed. The lesion was previously treated . Mid Cx lesion is 90% stenosed.  Second Obtuse Marginal Branch Non-stenotic 2nd Mrg-1 lesion was previously treated. The lesion is severely calcified. 2nd Mrg-2 lesion is 25% stenosed.  Right Coronary Artery Prox RCA to Mid RCA lesion is 35% stenosed.  LIMA Graft To Dist LAD  Graft To Lat 2nd Mrg Origin to Prox Graft lesion is 100% stenosed.  Intervention  Prox Cx to Mid Cx lesion Stent (Also treats lesions: Mid Cx) CATH LAUNCHER 6FR EBU 4 SH guide catheter was inserted. Lesion crossed with guidewire using a WIRE ASAHI PROWATER 180CM. Pre-stent angioplasty was performed using a BALLN SAPPHIRE 2.5X15. A drug-eluting stent was successfully placed using a SYNERGY XD 3.0X28. Stent strut is well apposed. Post-stent angioplasty was performed using a BALL SAPPHIRE NC24 3.25X22. Maximum pressure:  18 atm. Post-Intervention Lesion Assessment The intervention was successful. Pre-interventional TIMI flow is 3. Post-intervention TIMI flow is 3. No complications occurred at this lesion. There is a 0% residual stenosis post intervention.  Mid Cx lesion Stent (Also treats lesions: Prox Cx to Mid Cx) See details in Prox Cx to Mid Cx lesion. Post-Intervention Lesion Assessment The intervention was successful. Pre-interventional TIMI flow is 3. Post-intervention TIMI flow is 3. No complications occurred at this lesion. There is a 0% residual stenosis post intervention.   CARDIAC CATHETERIZATION  CARDIAC CATHETERIZATION 05/28/2022  Narrative   Prox Cx lesion is 90% stenosed.   Mid LM to Ost LAD lesion is 75% stenosed.   2nd Mrg-1 lesion is 95% stenosed.   2nd Mrg-2 lesion is 50% stenosed.   A drug-eluting stent was successfully placed using a STENT ONYX FRONTIER 2.5X30.   A stent was successfully placed.   A  drug-eluting stent was successfully placed using a STENT ONYX FRONTIER 3.5X18.  Post intervention, there is a 0% residual stenosis.   Post intervention, there is a 0% residual stenosis.   Post intervention, there is a 0% residual stenosis.  Successful PCI of the left main, proximal LCx, OM to the mid vessel incorporating orbital atherectomy, Shockwave balloon lithotripsy and DES x 2 in overlapping fashion.  Plan; DAPT with ASA for one month, Plavix for 12 months. May resume Coumadin today.  Findings Coronary Findings Diagnostic  Dominance: Right  Left Main Mid LM to Ost LAD lesion is 75% stenosed. The lesion is severely calcified.  Left Anterior Descending Prox LAD to Mid LAD lesion is 90% stenosed. The lesion is calcified. Mid LAD lesion is 100% stenosed.  Left Circumflex Prox Cx lesion is 90% stenosed. Mid Cx lesion is 25% stenosed.  Second Obtuse Marginal Branch 2nd Mrg-1 lesion is 95% stenosed. The lesion is severely calcified. 2nd Mrg-2 lesion is 50% stenosed.  Right Coronary Artery Mid RCA lesion is 25% stenosed.  LIMA Graft To Dist LAD  Graft To Lat 2nd Mrg Origin to Prox Graft lesion is 100% stenosed.  Intervention  Mid LM to Ost LAD lesion Stent CATH LAUNCHER 6FR EBU3.5 guide catheter was inserted. Lesion crossed with guidewire using a WIRE ASAHI PROWATER 180CM. Pre-stent angioplasty was not performed. A drug-eluting stent was successfully placed using a STENT ONYX FRONTIER 3.5X18. Stent strut is well apposed. Stent overlaps previously placed stent. Post-stent angioplasty was performed using a BALL SAPPHIRE NC24 3.75X12. Maximum pressure:  18 atm. Atherectomy Orbital atherectomy was performed using a CROWN DIAMONDBACK CLASSIC 1.25. 2 passes taken. Post-Intervention Lesion Assessment The intervention was successful. Pre-interventional TIMI flow is 3. Post-intervention TIMI flow is 3. No complications occurred at this lesion. There is a 0% residual stenosis post  intervention.  Prox Cx lesion Stent A stent was successfully placed. Post-Intervention Lesion Assessment The intervention was successful. Pre-interventional TIMI flow is 3. Post-intervention TIMI flow is 3. No complications occurred at this lesion. There is a 0% residual stenosis post intervention.  2nd Mrg-1 lesion Stent A drug-eluting stent was successfully placed using a STENT ONYX FRONTIER 2.5X30. Stent strut is well apposed. Post-stent angioplasty was performed using a BALLN [place] EMERGE MR 2.5X15. Atherectomy Orbital atherectomy was performed using a CROWN DIAMONDBACK CLASSIC 1.25. 8 passes taken. Atherectomy Using a CATHETER SHOCKWAVE 2.5X12. 6 passes taken. Post-Intervention Lesion Assessment The intervention was successful. Pre-interventional TIMI flow is 1. Post-intervention TIMI flow is 3. No complications occurred at this lesion. There is a 0% residual stenosis post intervention.     ECHOCARDIOGRAM  ECHOCARDIOGRAM COMPLETE 01/01/2023  Narrative ECHOCARDIOGRAM REPORT    Patient Name:   ARYE WEYENBERG [facility] Date of Exam: 01/01/2023 Medical Rec #:  [id number]       Height:       66.5 in Accession #:    [id number]      Weight:       185.6 lb Date of Birth:  October 01, 1945      BSA:          1.948 m Patient Age:    57 years        BP:           130/81 mmHg Patient Gender: M               HR:           74 bpm. Exam Location:  Inpatient  Procedure: 2D Echo, Cardiac Doppler, Color Doppler and Intracardiac Opacification Agent  Indications:    Chest Pain R07.9  History:        Patient has prior history of Echocardiogram examinations, most recent 05/26/2022. CAD and Previous Myocardial Infarction, Pacemaker and Prior CABG, Carotid Disease, Aortic Valve Disease; Risk Factors:Diabetes, Hypertension and Former Smoker. Aortic Valve: St. Jude mechanical valve is present in the aortic position. Procedure Date: 2005.  Sonographer:    Dondra Prader RVT RCS Referring Phys: [phone] [facility]   Sonographer Comments: Suboptimal apical window. IMPRESSIONS   1. Left ventricular ejection fraction, by estimation, is 50 to 55%. The left ventricle has low normal function. The left ventricle demonstrates regional wall motion abnormalities (see scoring diagram/findings for description). The left ventricular internal cavity size was mildly dilated. Left ventricular diastolic parameters are indeterminate. There is mild hypokinesis of the left ventricular, mid-apical inferior wall. There is hypokinesis of the left ventricular, apical septal wall. 2. Right ventricular systolic function is normal. The right ventricular size is normal. 3. Right atrial size was moderately dilated. 4. The mitral valve is grossly normal. Mild mitral valve regurgitation. No evidence of mitral stenosis. 5. The aortic valve has been repaired/replaced. Aortic valve regurgitation is trivial. There is a St. Jude mechanical valve present in the aortic position. Procedure Date: 2005. 6. Aortic root/ascending aorta has been repaired/replaced. 7. The inferior vena cava is normal in size with greater than 50% respiratory variability, suggesting right atrial pressure of 3 mmHg.  Comparison(s): No significant change from prior study.  Conclusion(s)/Recommendation(s): No left ventricular mural or apical thrombus/thrombi.  FINDINGS Left Ventricle: Left ventricular ejection fraction, by estimation, is 50 to 55%. The left ventricle has low normal function. The left ventricle demonstrates regional wall motion abnormalities. Mild hypokinesis of the left ventricular, mid-apical inferior wall. Definity contrast agent was given IV to delineate the left ventricular endocardial borders. The left ventricular internal cavity size was mildly dilated. There is borderline left ventricular hypertrophy. Abnormal (paradoxical) septal motion, consistent with RV pacemaker. Left ventricular diastolic parameters are indeterminate.  Right  Ventricle: The right ventricular size is normal. No increase in right ventricular wall thickness. Right ventricular systolic function is normal.  Left Atrium: Left atrial size was normal in size.  Right Atrium: Right atrial size was moderately dilated.  Pericardium: There is no evidence of pericardial effusion.  Mitral Valve: The mitral valve is grossly normal. Mild mitral valve regurgitation. No evidence of mitral valve stenosis.  Tricuspid Valve: The tricuspid valve is normal in structure. Tricuspid valve regurgitation is trivial. No evidence of tricuspid stenosis.  Aortic Valve: The aortic valve has been repaired/replaced. Aortic valve regurgitation is trivial. Aortic valve mean gradient measures 5.0 mmHg. Aortic valve peak gradient measures 9.2 mmHg. Aortic valve area, by VTI measures 2.54 cm. There is a St. Jude mechanical valve present in the aortic position. Procedure Date: 2005.  Pulmonic Valve: The pulmonic valve was not well visualized. Pulmonic valve regurgitation is not visualized. No evidence of pulmonic stenosis.  Aorta: The aortic root/ascending aorta has been repaired/replaced.  Venous: The inferior vena cava is normal in size with greater than 50% respiratory variability, suggesting right atrial pressure of 3 mmHg.  IAS/Shunts: The atrial septum is grossly normal.  Additional Comments: A device lead is visualized in the right ventricle.   LEFT VENTRICLE PLAX 2D LVIDd:         5.30 cm   Diastology LVIDs:         3.80 cm   LV e' medial:    8.38 cm/s LV PW:         0.90  cm   LV E/e' medial:  10.1 LV IVS:        1.10 cm   LV e' lateral:   6.42 cm/s LVOT diam:     1.80 cm   LV E/e' lateral: 13.1 LV SV:         79 LV SV Index:   41 LVOT Area:     2.54 cm   RIGHT VENTRICLE            IVC RV Basal diam:  3.50 cm    IVC diam: 1.50 cm RV S prime:     8.40 cm/s TAPSE (M-mode): 2.0 cm  LEFT ATRIUM           Index        RIGHT ATRIUM           Index LA diam:       4.00 cm 2.05 cm/m   RA Area:     23.00 cm LA Vol (A4C): 33.2 ml 17.07 ml/m  RA Volume:   77.50 ml  39.78 ml/m AORTIC VALVE                     PULMONIC VALVE AV Area (Vmax):    2.40 cm      PV Vmax:       0.92 m/s AV Area (Vmean):   2.33 cm      PV Peak grad:  3.4 mmHg AV Area (VTI):     2.54 cm AV Vmax:           151.67 cm/s AV Vmean:          101.967 cm/s AV VTI:            0.313 m AV Peak Grad:      9.2 mmHg AV Mean Grad:      5.0 mmHg LVOT Vmax:         143.00 cm/s LVOT Vmean:        93.300 cm/s LVOT VTI:          0.312 m LVOT/AV VTI ratio: 1.00  AORTA Ao Root diam: 3.20 cm Ao Asc diam:  3.80 cm  MITRAL VALVE MV Area (PHT): 3.34 cm    SHUNTS MV Decel Time: 227 msec    Systemic VTI:  0.31 m MV E velocity: 84.40 cm/s  Systemic Diam: 1.80 cm MV A velocity: 97.10 cm/s MV E/A ratio:  0.87  Jodelle Red MD Electronically signed by Jodelle Red MD Signature Date/Time: 01/01/2023/2:52:48 PM    Final             Assessment & Plan   Chest pain, prior h/o CAD with prior CABG 2005 and last NSTEMI 05/2022 s/p PCI as above/post procedure complications - s/p LCX territory intervention- continue ASA, Plavix (on Plavix PTA given concomitant Coumadin) - needs cardiology visit near one month to discuss risk and benefits of de-escalating his triple therapy given his burden of disease - continue atorvastatin - continue metoprolol - lovenox is not affordable to him; hopefully INR at goal tomorrow so he can discharge - tolerating low dose IMDUR with no symptoms   Acute on Chronic HFpEF   - needed IV lasix post cath --resume home lasix 40 mg daily.    H/o aortic stenosis (s/p Bentall aortic root replacement 2005 with St. Jude mechanical valve) with ascending thoracic aortic aneurysm  - INR is improving    CHB s/p PPM placement - needs EP f/u    HTN - continue  current meds; outpatient ARB if needed (not planned for outpatient DC back on ARB     Riley Lam, MD FASE [facility] Cardiologist [facility]  [address] [place], [address] [place], [place] [address] [phone]  8:56 AM

## 2023-01-06 DIAGNOSIS — I200 Unstable angina: Secondary | ICD-10-CM | POA: Diagnosis not present

## 2023-01-06 LAB — MAGNESIUM: Magnesium: 2 mg/dL (ref 1.7–2.4)

## 2023-01-06 LAB — CBC
HCT: 34.3 % — ABNORMAL LOW (ref 39.0–52.0)
Hemoglobin: 11 g/dL — ABNORMAL LOW (ref 13.0–17.0)
MCH: 29.6 pg (ref 26.0–34.0)
MCHC: 32.1 g/dL (ref 30.0–36.0)
MCV: 92.5 fL (ref 80.0–100.0)
Platelets: 160 10*3/uL (ref 150–400)
RBC: 3.71 MIL/uL — ABNORMAL LOW (ref 4.22–5.81)
RDW: 14.9 % (ref 11.5–15.5)
WBC: 6.5 10*3/uL (ref 4.0–10.5)
nRBC: 0 % (ref 0.0–0.2)

## 2023-01-06 LAB — BASIC METABOLIC PANEL
Anion gap: 6 (ref 5–15)
BUN: 12 mg/dL (ref 8–23)
CO2: 27 mmol/L (ref 22–32)
Calcium: 8.3 mg/dL — ABNORMAL LOW (ref 8.9–10.3)
Chloride: 103 mmol/L (ref 98–111)
Creatinine, Ser: 0.92 mg/dL (ref 0.61–1.24)
GFR, Estimated: >60 mL/min (ref >60)
Glucose, Bld: 110 mg/dL — ABNORMAL HIGH (ref 70–99)
Potassium: 3.6 mmol/L (ref 3.5–5.1)
Sodium: 136 mmol/L (ref 135–145)

## 2023-01-06 LAB — PROTIME-INR
INR: 2.2 — ABNORMAL HIGH (ref 0.8–1.2)
Prothrombin Time: 24.8 seconds — ABNORMAL HIGH (ref 11.4–15.2)

## 2023-01-06 MED ORDER — ISOSORBIDE MONONITRATE ER 30 MG PO TB24: 15.0000 mg | ORAL_TABLET | Freq: Every day | ORAL | 0 refills | Status: DC

## 2023-01-06 MED ORDER — POTASSIUM CHLORIDE CRYS ER 20 MEQ PO TBCR
40.0000 meq | EXTENDED_RELEASE_TABLET | Freq: Once | ORAL | Status: AC
  Administered 2023-01-06: 40 meq via ORAL
  Filled 2023-01-06: qty 2

## 2023-01-06 MED ORDER — WARFARIN SODIUM 5 MG PO TABS: 5.0000 mg | ORAL_TABLET | Freq: Once | ORAL | Status: DC

## 2023-01-06 MED ORDER — ASPIRIN 81 MG PO CHEW: 81.0000 mg | CHEWABLE_TABLET | Freq: Every day | ORAL | 0 refills | Status: DC

## 2023-01-06 NOTE — Progress Notes (Addendum)
ANTICOAGULATION CONSULT NOTE  Pharmacy Consult for warfarin  Indication: chest pain/ACS>mAVR  Allergies  Allergen Reactions   Bee Venom Shortness Of Breath and Swelling   Codeine Phosphate Hives, Shortness Of Breath, Itching, Rash and Other (See Comments)    Tolerated multiple doses of IV morphine   Metoprolol Tartrate Other (See Comments)    loss of vision. Can take the XL tablet not regular    Pramipexole Other (See Comments)    Hallucinations     Tramadol     Pt has hallucinations    Ramipril Rash and Cough    Patient Measurements: Height: 5' 6.5" (168.9 cm) Weight: 83.9 kg (184 lb 14.4 oz) IBW/kg (Calculated) : 64.95 Heparin Dosing Weight: 82.7kg   Vital Signs: Temp: 97.6 F (36.4 C) (06/27 0801) Temp Source: Oral (06/27 0801) BP: 139/83 (06/27 0801) Pulse Rate: 73 (06/27 0801)  Labs: Recent Labs    01/03/23 2006 01/03/23 2227 01/04/23 0157 01/05/23 0246 01/06/23 0254  HGB 13.9  --  13.0 11.4* 11.0*  HCT 42.3  --  39.1 34.3* 34.3*  PLT 188  --  184 164 160  LABPROT  --   --   --  21.2* 24.8*  INR  --   --   --  1.8* 2.2*  CREATININE 0.88  --  0.88 0.85 0.92  TROPONINIHS 776* 3,684*  --   --   --      Estimated Creatinine Clearance: 68 mL/min (by C-G formula based on SCr of 0.92 mg/dL).   Medical History: Past Medical History:  Diagnosis Date   Acute renal failure (HCC) 02/09/2013   Aortic stenosis 08/25/2010   a. Bentall aortic root replacement with a St. Jude mechanical valve and Hemashield conduit 02/2004.   Arthritis    Asthma    AV BLOCK, COMPLETE    a. s/p St Jude dual chamber pacemaker 02/2004.   CAD (coronary artery disease)    a. s/p CABGx2 (LIMA-dLAD, SVG-Cx). b. Low risk nuc 12/2012 without ischemia, EF 46% mild apical hypokinesia (EF 55% inf HK by echo).   Carotid artery disease (HCC)    a. 0-39% bilateral ICA stenosis, stable mild hard plaque in carotid bulbs. F/u 03/2014 recommended.   Diabetes mellitus without complication (HCC)    type  2    DIVERTICULOSIS, COLON 10/17/2007   Ejection fraction    a. EF 55% with inf HK, mild MR by echo 12/2012.   GERD (gastroesophageal reflux disease)    hxof    GOUT 01/16/2007   Headache    hx of migraines    HEMORRHOIDS, INTERNAL 11/01/2008   Hx of adenomatous polyp of colon 02/2019   no recall needed   HYPERLIPIDEMIA 01/16/2007   HYPERTENSION 01/16/2007   Impaired glucose tolerance    LEG EDEMA 11/27/2008   Special screening for malignant neoplasm of prostate    Warfarin anticoagulation    AVR    Assessment: Patient admitted with CC of chest pain. Patient with extensive cardiac history on warfarin for mechanical aortic valve. Last dose 6/21. Heparin drip started once INR dropped with plans for cath and resumed post/cath.  Home warfarin dose is 5mg  Tues/Thurs/Sun, 10mg  AODs.  INR is therapeutic at 2.2. Hgb 11, plt 160. Remains on enoxparin 80 mg every 12 hours - plan for discharge today. No s/sx of bleeding.   Goal of Therapy:  INR goal 2-3 per outpatient notes  Anti-Xa level 0.6-1 units/ml 4hrs after LMWH dose given Monitor platelets by anticoagulation protocol: Yes  Plan:  Warfarin 5 mg daily as per PTA regimen (ordered inpatient if would remain in hospital at timing of dose) - continue PTA regimen at discharge  Enoxaparin 80mg  SQ BID - discontinue at discharge  Daily INR  Thank you for allowing pharmacy to participate in this patient's care,  Sherron Monday, PharmD, BCCCP Clinical Pharmacist  Phone: [phone] 01/06/2023 9:01 AM  Please check AMION for all [facility] Pharmacy phone numbers After 10:00 PM, call Main Pharmacy [phone]

## 2023-01-06 NOTE — Progress Notes (Addendum)
Progress Note  Patient Name: [name] Date of Encounter: 01/06/2023  Primary Cardiologist: Donato Schultz, MD  Subjective   No CP or SOB. No acute complaints today.  Inpatient Medications    Scheduled Meds:  aspirin  81 mg Oral Daily   atorvastatin  80 mg Oral Daily   benzonatate  100 mg Oral BID   carbidopa-levodopa  1 tablet Oral QHS   clopidogrel  75 mg Oral Daily   enoxaparin (LOVENOX) injection  80 mg Subcutaneous Q12H   furosemide  40 mg Oral Daily   guaiFENesin  600 mg Oral BID   isosorbide mononitrate  15 mg Oral Daily   lidocaine  3 patch Transdermal Daily   melatonin  5 mg Oral QHS   metoprolol succinate  50 mg Oral Daily   sodium chloride flush  3 mL Intravenous Q12H   sodium chloride flush  3 mL Intravenous Q12H   Warfarin - Pharmacist Dosing Inpatient   Does not apply q1600   Continuous Infusions:  sodium chloride     PRN Meds: sodium chloride, acetaminophen **OR** acetaminophen, albuterol, guaiFENesin, hydrALAZINE, metoprolol tartrate, morphine injection, nitroGLYCERIN, ondansetron (ZOFRAN) IV, ondansetron **OR** [DISCONTINUED] ondansetron (ZOFRAN) IV, mouth rinse, senna-docusate, sodium chloride flush, traZODone   Vital Signs    Vitals:   01/05/23 0822 01/05/23 1230 01/05/23 2100 01/06/23 0315  BP: 123/65 112/60 114/62 121/64  Pulse: 76 74 71 68  Resp: 18 18 18 18   Temp: 98.3 F (36.8 C) 98.2 F (36.8 C) 98.4 F (36.9 C) 97.6 F (36.4 C)  TempSrc: Oral Oral Oral Oral  SpO2: 93% 95% 95% 93%  Weight:      Height:       No intake or output data in the 24 hours ending 01/06/23 0731    01/04/2023    4:31 AM 01/03/2023    4:40 AM 01/02/2023    5:00 AM  Last 3 Weights  Weight (lbs) 184 lb 14.4 oz 186 lb 6.4 oz 187 lb 4.8 oz  Weight (kg) 83.87 kg 84.55 kg 84.959 kg     Telemetry    A sensed V Paced - Personally Reviewed  ECG    No new tracings - Personally Reviewed  Physical Exam   GEN: No acute distress.  HEENT: Normocephalic,  atraumatic, sclera non-icteric. Neck: No JVD or bruits. Cardiac: RRR no murmurs, rubs, or gallops.  Respiratory: Clear to auscultation bilaterally. Breathing is unlabored. GI: Soft, nontender, non-distended, BS +x 4. MS: no deformity. Extremities: No clubbing or cyanosis. No edema. Distal pedal pulses are 2+ and equal bilaterally. Right groin with mild ecchymosis and 2 focal areas of small hematoma, 1 smaller area of skin tear towards external groin skin crease. No oozing or bruits Neuro:  AAOx3. Follows commands. Psych:  Responds to questions appropriately with a normal affect.  Labs    High Sensitivity Troponin:   Recent Labs  Lab 12/31/22 2141 12/31/22 2339 01/03/23 2006 01/03/23 2227  TROPONINIHS 17 20* 776* 3,684*      Cardiac EnzymesNo results for input(s): "TROPONINI" in the last 168 hours. No results for input(s): "TROPIPOC" in the last 168 hours.   Chemistry Recent Labs  Lab 01/01/23 0804 01/02/23 0658 01/04/23 0157 01/05/23 0246 01/06/23 0254  NA 139   < > 136 134* 136  K 4.3   < > 3.6 3.5 3.6  CL 105   < > 104 103 103  CO2 26   < > 22 25 27   GLUCOSE 102*   < >  101* 103* 110*  BUN 15   < > 12 14 12   CREATININE 0.74   < > 0.88 0.85 0.92  CALCIUM 8.7*   < > 8.3* 8.2* 8.3*  PROT 5.4*  --   --   --   --   ALBUMIN 3.4*  --   --   --   --   AST 33  --   --   --   --   ALT 22  --   --   --   --   ALKPHOS 38  --   --   --   --   BILITOT 1.2  --   --   --   --   GFRNONAA >60   < > >60 >60 >60  ANIONGAP 8   < > 10 6 6    < > = values in this interval not displayed.     Hematology Recent Labs  Lab 01/04/23 0157 01/05/23 0246 01/06/23 0254  WBC 13.6* 8.3 6.5  RBC 4.29 3.76* 3.71*  HGB 13.0 11.4* 11.0*  HCT 39.1 34.3* 34.3*  MCV 91.1 91.2 92.5  MCH 30.3 30.3 29.6  MCHC 33.2 33.2 32.1  RDW 15.0 15.0 14.9  PLT 184 164 160    BNP Recent Labs  Lab 01/03/23 2021  BNP 210.8*     DDimer No results for input(s): "DDIMER" in the last 168 hours.    Radiology    No results found.  Cardiac Studies   2d echo 01/01/23   1. Left ventricular ejection fraction, by estimation, is 50 to 55%. The  left ventricle has low normal function. The left ventricle demonstrates  regional wall motion abnormalities (see scoring diagram/findings for  description). The left ventricular  internal cavity size was mildly dilated. Left ventricular diastolic  parameters are indeterminate. There is mild hypokinesis of the left  ventricular, mid-apical inferior wall. There is hypokinesis of the left  ventricular, apical septal wall.   2. Right ventricular systolic function is normal. The right ventricular  size is normal.   3. Right atrial size was moderately dilated.   4. The mitral valve is grossly normal. Mild mitral valve regurgitation.  No evidence of mitral stenosis.   5. The aortic valve has been repaired/replaced. Aortic valve  regurgitation is trivial. There is a St. Jude mechanical valve present in  the aortic position. Procedure Date: 2005.   6. Aortic root/ascending aorta has been repaired/replaced.   7. The inferior vena cava is normal in size with greater than 50%  respiratory variability, suggesting right atrial pressure of 3 mmHg.   Comparison(s): No significant change from prior study.   Conclusion(s)/Recommendation(s): No left ventricular mural or apical  thrombus/thrombi.   Patient Profile     77 y.o. male with CAD (s/p CABGx2 in 2005 with LIMA-LAD and SVG-LCx, NSTEMI 05/2022 s/p successful PCI of the left main, proximal LCx, OM to the mid vessel incorporating orbital atherectomy, Shockwave balloon lithotripsy and DES x 2 in overlapping fashion), aortic stenosis (s/p Bentall aortic root replacement with St. Jude mechanical valve), CHB (s/p PPM placement), ascending thoracic aortic aneurysm, Parkinson's disease, HTN, HLD and Type 2 DM admitted with chest pain concerning for unstable angina.   Assessment & Plan    1. Chest pain,  prior h/o CAD with prior CABG 2005 and last NSTEMI 05/2022 s/p PCI as above - hsTroponin low/flat at 17->20 - s/p cath 01/03/23 with focal severe ISR of the proximal LCx prior to OM1/90% mLCx, treated  with PCI/DES of the proximal to mid LCx across the first OM essentially resulting in bifurcation stenting with prior stents, patent LIMA-LAD, known occlusion of SVG-LCx - plan for DAPT indefinitely per cath note - but patient is also on Coumadin so will clarify rec with MD - continue atorvastatin - continue metoprolol - low dose nitrate added this admission - will ask MD to eval cath site given bruising - no oozing or bruit, will also ask nurse to apply small gauze to area of skin tear that was under tegaderm   2. H/o aortic stenosis (s/p Bentall aortic root replacement 2005 with St. Jude mechanical valve) with ascending thoracic aortic aneurysm - Coumadin was held for cath, then placed on Lovenox/Coumadin bridge post-cath - patient remained inpatient due to not being able to afford Lovenox at home - INR today is 2.2 - await pharmacy recs - has INR check 01/14/23 already, await input whether this will suffice - last chest CT 04/2022 with unchanged fusiform aneurysmal dilation of the distal ascending thoracic aorta measuring up to 4.5 cm, recommending 6 month follow-up, Dr. Anne Fu recommended re-eval 04/2023, continue to review as outpatient   3. CHB s/p PPM placement - continue OP EP f/u, overdue, last OV 2022, will need to get back on track as OP   4. HTN - controlled on home Lasix, metoprolol, new Imdur - will defer timing of resumption of losartan to MD (has DM)   5. Mild anemia - Hgb 13's on admission, today 11 - similar ranges seen in the past - follow, no active bleeding reported   6. R eye injection - per IM  Has INR check 01/14/23 Had clinic follow-up 01/24/23 with APP but per discussion with Dr. Izora Ribas, really needs to see MD for post-hospital follow-up so scheduled on Dr.  Anne Fu' next DOD day, 01/19/23 Recommend to review scheduling overdue EP f/u at time of hospital f/u  For questions or updates, please contact [facility] Please consult [email or web address] for contact info under Cardiology/STEMI.  Signed, Laurann Montana, PA-C 01/06/2023, 7:31 AM    Personally seen and examined. Agree with APP above with the following comments: No further angina. Mild bruising but over all doing well Patient notes no further symptoms.  Exam notable for  - slight skin tears around R fem with slight bruis but no significant hematoma and good proximal and distal pulses; pill rolling tremor.  Labs notable for INR above 2 Tele: Largely AS VP Would recommend  - short term triple therapy is reasonable; he is s/p mechanical valve and had recent PCI with significant disease burden; long term this may not be tolerated well given anemia and Parkinson disease (without frequent falls).  Will reach out to patients primary cardiologist for long term recs, presently doing well on current therapy  Riley Lam, MD FASE [facility] Cardiologist [facility]  [address] [place], [address] [place], [place] [address] [phone]  11:19 AM

## 2023-01-06 NOTE — Plan of Care (Signed)
Problem: Education: Goal: Knowledge of General Education information will improve Description: Including pain rating scale, medication(s)/side effects and non-pharmacologic comfort measures 01/06/2023 0901 by Herma Carson, RN Outcome: Adequate for Discharge 01/06/2023 0831 by Herma Carson, RN Outcome: Progressing   Problem: Health Behavior/Discharge Planning: Goal: Ability to manage health-related needs will improve 01/06/2023 0901 by Herma Carson, RN Outcome: Adequate for Discharge 01/06/2023 0831 by Herma Carson, RN Outcome: Progressing   Problem: Clinical Measurements: Goal: Ability to maintain clinical measurements within normal limits will improve 01/06/2023 0901 by Herma Carson, RN Outcome: Adequate for Discharge 01/06/2023 0831 by Herma Carson, RN Outcome: Progressing Goal: Will remain free from infection 01/06/2023 0901 by Herma Carson, RN Outcome: Adequate for Discharge 01/06/2023 0831 by Herma Carson, RN Outcome: Progressing Goal: Diagnostic test results will improve 01/06/2023 0901 by Herma Carson, RN Outcome: Adequate for Discharge 01/06/2023 0831 by Herma Carson, RN Outcome: Progressing Goal: Respiratory complications will improve 01/06/2023 0901 by Herma Carson, RN Outcome: Adequate for Discharge 01/06/2023 0831 by Herma Carson, RN Outcome: Progressing Goal: Cardiovascular complication will be avoided 01/06/2023 0901 by Herma Carson, RN Outcome: Adequate for Discharge 01/06/2023 0831 by Herma Carson, RN Outcome: Progressing   Problem: Activity: Goal: Risk for activity intolerance will decrease 01/06/2023 0901 by Herma Carson, RN Outcome: Adequate for Discharge 01/06/2023 0831 by Herma Carson, RN Outcome: Progressing   Problem: Nutrition: Goal: Adequate nutrition will be maintained 01/06/2023 0901 by Herma Carson, RN Outcome: Adequate for Discharge 01/06/2023 0831 by Herma Carson, RN Outcome: Progressing    Problem: Coping: Goal: Level of anxiety will decrease 01/06/2023 0901 by Herma Carson, RN Outcome: Adequate for Discharge 01/06/2023 0831 by Herma Carson, RN Outcome: Progressing   Problem: Elimination: Goal: Will not experience complications related to bowel motility 01/06/2023 0901 by Herma Carson, RN Outcome: Adequate for Discharge 01/06/2023 0831 by Herma Carson, RN Outcome: Progressing Goal: Will not experience complications related to urinary retention 01/06/2023 0901 by Herma Carson, RN Outcome: Adequate for Discharge 01/06/2023 0831 by Herma Carson, RN Outcome: Progressing   Problem: Pain Managment: Goal: General experience of comfort will improve 01/06/2023 0901 by Herma Carson, RN Outcome: Adequate for Discharge 01/06/2023 0831 by Herma Carson, RN Outcome: Progressing   Problem: Safety: Goal: Ability to remain free from injury will improve 01/06/2023 0901 by Herma Carson, RN Outcome: Adequate for Discharge 01/06/2023 0831 by Herma Carson, RN Outcome: Progressing   Problem: Skin Integrity: Goal: Risk for impaired skin integrity will decrease 01/06/2023 0901 by Herma Carson, RN Outcome: Adequate for Discharge 01/06/2023 0831 by Herma Carson, RN Outcome: Progressing   Problem: Education: Goal: Understanding of CV disease, CV risk reduction, and recovery process will improve 01/06/2023 0901 by Herma Carson, RN Outcome: Adequate for Discharge 01/06/2023 0831 by Herma Carson, RN Outcome: Progressing Goal: Individualized Educational Video(s) 01/06/2023 0901 by Herma Carson, RN Outcome: Adequate for Discharge 01/06/2023 0831 by Herma Carson, RN Outcome: Progressing   Problem: Activity: Goal: Ability to return to baseline activity level will improve 01/06/2023 0901 by Herma Carson, RN Outcome: Adequate for Discharge 01/06/2023 0831 by Herma Carson, RN Outcome: Progressing   Problem: Cardiovascular: Goal: Ability to  achieve and maintain adequate cardiovascular perfusion will improve 01/06/2023 0901 by Herma Carson, RN Outcome: Adequate for Discharge 01/06/2023 0831 by Herma Carson, RN Outcome: Progressing Goal: Vascular access  site(s) Level 0-1 will be maintained 01/06/2023 0901 by Herma Carson, RN Outcome: Adequate for Discharge 01/06/2023 0831 by Herma Carson, RN Outcome: Progressing   Problem: Health Behavior/Discharge Planning: Goal: Ability to safely manage health-related needs after discharge will improve 01/06/2023 0901 by Herma Carson, RN Outcome: Adequate for Discharge 01/06/2023 0831 by Herma Carson, RN Outcome: Progressing

## 2023-01-06 NOTE — Progress Notes (Signed)
Pt and family denies questions.  CRP2 order in.

## 2023-01-06 NOTE — Plan of Care (Signed)

## 2023-01-06 NOTE — Discharge Summary (Signed)
Physician Discharge Summary  [name] ZOX:[id number] DOB: 1945-11-28 DOA: 12/31/2022  PCP: [name], NP  Admit date: 12/31/2022 Discharge date: 01/06/2023  Admitted From: Home Disposition:  Home  Recommendations for Outpatient Follow-up:  Follow up with PCP in 1-2 weeks Please obtain BMP/CBC in one week your next doctors visit.  Outpatient Cardiology follow up  Follow up INR outpatient, on Coumadin.  Started on ASA and Imdur. For now discontinued Losartan.    Discharge Condition: Stable CODE STATUS: Full  Diet recommendation: Cardiac  Brief/Interim Summary: 77 year old man medical history significant for CAD s/p CABG in 2015, multiple cath most recent on 07/28/2021, aortic stenosis s/p aortic root replacement with St Jude mechanical AVR, complete heart block s/p pacemaker placement, hypertension, hyperlipidemia, non-insulin-dependent diabetes mellitus type 2 and Parkinson disease presented to emergency department at [facility] with complaining of several days of chest pain worse with exertion but ongoing even at rest, not subsiding with sublingual nitroglycerin at home.  Upon admission patient underwent cardiac catheterization 6/24 showing multivessel disease requiring drug-eluting stent placement.  Echocardiogram showed EF of 50-55%.  Patient was started on aspirin and Plavix and was advised to continue Coumadin as well.  Initially stayed in the hospital for Lovenox to Coumadin bridge.  Today INR is therapeutic and medically stable for discharge with recommendations as stated above.     Assessment & Plan:  Principal Problem:   Unstable angina (HCC) Active Problems:   CAD (coronary artery disease), autologous vein bypass graft   HTN (hypertension)   History of permanent cardiac pacemaker placement   History of transcatheter aortic valve replacement (TAVR)   HLD (hyperlipidemia)   DM type 2 (diabetes mellitus, type 2) (HCC)   Parkinson disease         Acute unstable angina (HCC), resolved Currently chest pain-free.  Status post left circumflex intervention.  Seen by cardiology, currently planning on dual antiplatelet therapy-aspirin and Plavix.  INR today therapeutic, continue Coumadin.  Recheck INR in next 3 to 5 days outpatient   -Cardiac catheterization 6/24 indicative of multivessel disease requiring drug-eluting stent placement  -Echocardiogram completed, EF 50-55% notable left ventricular wall motion abnormality, dilation with indeterminate diastolic parameters and noted hypokinesis in multiple walls of left ventricle.      Hypertension, well-controlled On Toprol XL, Losartan on Hold Added Imdur   Heart failure, grade 1 diastolic dysfunction, not in acute exacerbation; ef 55% -Most recent echo November 2023 preserved EF -Continue metoprolol, ARB held.  Added Imdur   CAD (coronary artery disease), autologous vein bypass graft - Continue Toprol-XL 50 mg daily, Lipitor 80 mg daily and aspirin.   Parkinson disease - Appears to be at baseline per family at bedside - Carbidopa levodopa 50/200 mg at bedtime.   DM type 2 (diabetes mellitus, type 2) (HCC), well-controlled -Continue diabetic diet -A1c 6.4.    HLD (hyperlipidemia) - Continue Lipitor 80 mg daily      Discharge Diagnoses:  Principal Problem:   Unstable angina (HCC) Active Problems:   CAD (coronary artery disease), autologous vein bypass graft   HTN (hypertension)   History of permanent cardiac pacemaker placement   History of transcatheter aortic valve replacement (TAVR)   HLD (hyperlipidemia)   DM type 2 (diabetes mellitus, type 2) (HCC)   Parkinson disease      Consultations: Cardiology  Subjective: Feels well wishes to go home.  Discharge Exam: Vitals:   01/06/23 0315 01/06/23 0801  BP: 121/64 139/83  Pulse: 68 73  Resp: 18 16  Temp: 97.6 F (36.4 C) 97.6 F (36.4 C)  SpO2: 93% 100%   Vitals:   01/05/23 1230 01/05/23 2100 01/06/23  0315 01/06/23 0801  BP: 112/60 114/62 121/64 139/83  Pulse: 74 71 68 73  Resp: 18 18 18 16   Temp: 98.2 F (36.8 C) 98.4 F (36.9 C) 97.6 F (36.4 C) 97.6 F (36.4 C)  TempSrc: Oral Oral Oral Oral  SpO2: 95% 95% 93% 100%  Weight:      Height:        General: Pt is alert, awake, not in acute distress Cardiovascular: RRR, S1/S2 +, no rubs, no gallops Respiratory: CTA bilaterally, no wheezing, no rhonchi Abdominal: Soft, NT, ND, bowel sounds + Extremities: no edema, no cyanosis  Discharge Instructions  Discharge Instructions     Amb Referral to Cardiac Rehabilitation   Complete by: As directed    Diagnosis: Coronary Stents   After initial evaluation and assessments completed: Virtual Based Care may be provided alone or in conjunction with Phase 2 Cardiac Rehab based on patient barriers.: Yes   Intensive Cardiac Rehabilitation (ICR) [facility] location only OR Traditional Cardiac Rehabilitation (TCR) *If criteria for ICR are not met will enroll in TCR [facility] [place] only): Yes      Allergies as of 01/06/2023       Reactions   Bee Venom Shortness Of Breath, Swelling   Codeine Phosphate Hives, Shortness Of Breath, Itching, Rash, Other (See Comments)   Tolerated multiple doses of IV morphine   Metoprolol Tartrate Other (See Comments)   loss of vision. Can take the XL tablet not regular    Pramipexole Other (See Comments)   Hallucinations    Tramadol    Pt has hallucinations    Ramipril Rash, Cough        Medication List     STOP taking these medications    losartan 25 MG tablet Commonly known as: COZAAR       TAKE these medications    Accu-Chek Guide test strip Generic drug: glucose blood USE AS INSTRUCTED   Accu-Chek Softclix Lancets lancets USE TO CHECK BLOOD SUGAR TWICE A DAY AND AS NEEDED   acetaminophen 650 MG CR tablet Commonly known as: TYLENOL Take 1,300 mg by mouth 2 (two) times daily.   albuterol 108 (90 Base) MCG/ACT inhaler Commonly known as: VENTOLIN  HFA INHALE 2 PUFFS INTO THE LUNGS EVERY 6 HOURS AS NEEDED FOR WHEEZING OR SHORTNESS OF BREATH.   aspirin 81 MG chewable tablet Chew 1 tablet (81 mg total) by mouth daily.   atorvastatin 80 MG tablet Commonly known as: LIPITOR Take 1 tablet (80 mg total) by mouth daily.   calcium carbonate 600 MG Tabs tablet Commonly known as: OS-CAL Take 600 mg by mouth 2 (two) times daily with a meal.   carbidopa-levodopa 25-100 MG tablet Commonly known as: SINEMET IR Take 1 tablet by mouth 4 (four) times daily.   carbidopa-levodopa 50-200 MG tablet Commonly known as: SINEMET CR Take 1 tablet by mouth at bedtime.   cetirizine 10 MG tablet Commonly known as: ZYRTEC Take 10 mg by mouth daily.   cholecalciferol 25 MCG (1000 UNIT) tablet Commonly known as: VITAMIN D3 Take 1,000 Units by mouth every evening.   clopidogrel 75 MG tablet Commonly known as: PLAVIX Take 1 tablet (75 mg total) by mouth daily.   cyanocobalamin 1000 MCG tablet Commonly known as: VITAMIN B12 Take 1,000 mcg by mouth daily.   fluticasone 50 MCG/ACT nasal spray Commonly known as: FLONASE Place 2  sprays into both nostrils daily. What changed:  when to take this reasons to take this   furosemide 40 MG tablet Commonly known as: LASIX TAKE 1 TABLET EVERY DAY   isosorbide mononitrate 30 MG 24 hr tablet Commonly known as: IMDUR Take 0.5 tablets (15 mg total) by mouth daily.   melatonin 5 MG Tabs Take 5 mg by mouth at bedtime.   metFORMIN 500 MG tablet Commonly known as: GLUCOPHAGE TAKE 1/2 TABLET EVERY DAY WITH BREAKFAST (SCHEDULE APPT FOR FUTURE REFILLS) What changed:  how much to take how to take this when to take this   metoprolol succinate 50 MG 24 hr tablet Commonly known as: TOPROL-XL Take 1 tablet (50 mg total) by mouth daily. Take with or immediately following a meal.   multivitamin tablet Take 1 tablet by mouth daily.   nitroGLYCERIN 0.4 MG SL tablet Commonly known as: NITROSTAT PLACE 1  TABLET (0.4 MG TOTAL) UNDER THE TONGUE EVERY 5 (FIVE) MINUTES AS NEEDED. FOR CHEST PAIN What changed: reasons to take this   PHILLIPS COLON HEALTH PO Take 1 capsule by mouth daily.   potassium chloride 10 MEQ tablet Commonly known as: KLOR-CON TAKE 1 TABLET EVERY DAY   Tart Cherry Advanced Caps Take 2 capsules by mouth daily.   vitamin C 1000 MG tablet Take 1,000 mg by mouth daily.   warfarin 10 MG tablet Commonly known as: COUMADIN Take as directed. If you are unsure how to take this medication, talk to your nurse or doctor. Original instructions: TAKE 1/2 TO 1 TABLET DAILY OR AS DIRECTED BY COUMADIN CLINIC What changed: See the new instructions.        Follow-up Information     [facility] Follow up on 01/14/2023.   Specialty: Cardiology Why: [facility] location - Coumadin clinic follow-up arranged on January 14, 2023 at 11:15 AM to recheck your coumadin level. Contact information: [address] [place] [address] [phone]        [name], NP Follow up.   Specialty: Family Medicine Contact information: [address] [place] [address] [phone]         Jake Bathe, MD Follow up.   Specialty: Cardiology Why: [facility] - [address] location - cardiology follow-up with Dr. Anne Fu on Wednesday January 19, 2023 at 2:30 PM (Arrive by 2:15 PM). Contact information: [address] [place] [address] [phone]                Allergies  Allergen Reactions   Bee Venom Shortness Of Breath and Swelling   Codeine Phosphate Hives, Shortness Of Breath, Itching, Rash and Other (See Comments)    Tolerated multiple doses of IV morphine   Metoprolol Tartrate Other (See Comments)    loss of vision. Can take the XL tablet not regular    Pramipexole Other (See Comments)    Hallucinations     Tramadol     Pt has hallucinations    Ramipril Rash and Cough     You were cared for by a hospitalist during your hospital stay. If you have any questions about your discharge medications or the care you received while you were in the hospital after you are discharged, you can call the unit and asked to speak with the hospitalist on call if the hospitalist that took care of you is not available. Once you are discharged, your primary care physician will handle any further medical issues. Please  note that no refills for any discharge medications will be authorized once you are discharged, as it is imperative that you return to your primary care physician (or establish a relationship with a primary care physician if you do not have one) for your aftercare needs so that they can reassess your need for medications and monitor your lab values.  You were cared for by a hospitalist during your hospital stay. If you have any questions about your discharge medications or the care you received while you were in the hospital after you are discharged, you can call the unit and asked to speak with the hospitalist on call if the hospitalist that took care of you is not available. Once you are discharged, your primary care physician will handle any further medical issues. Please note that NO REFILLS for any discharge medications will be authorized once you are discharged, as it is imperative that you return to your primary care physician (or establish a relationship with a primary care physician if you do not have one) for your aftercare needs so that they can reassess your need for medications and monitor your lab values.  Please request your Prim.MD to go over all Hospital Tests and Procedure/Radiological results at the follow up, please get all Hospital records sent to your Prim MD by signing hospital release before you go home.  Get CBC, CMP, 2 view Chest X ray checked  by Primary MD during your next visit or SNF MD in 5-7 days ( we routinely change or add medications that can  affect your baseline labs and fluid status, therefore we recommend that you get the mentioned basic workup next visit with your PCP, your PCP may decide not to get them or add new tests based on their clinical decision)  On your next visit with your primary care physician please Get Medicines reviewed and adjusted.  If you experience worsening of your admission symptoms, develop shortness of breath, life threatening emergency, suicidal or homicidal thoughts you must seek medical attention immediately by calling 911 or calling your MD immediately  if symptoms less severe.  You Must read complete instructions/literature along with all the possible adverse reactions/side effects for all the Medicines you take and that have been prescribed to you. Take any new Medicines after you have completely understood and accpet all the possible adverse reactions/side effects.   Do not drive, operate heavy machinery, perform activities at heights, swimming or participation in water activities or provide baby sitting services if your were admitted for syncope or siezures until you have seen by Primary MD or a Neurologist and advised to do so again.  Do not drive when taking Pain medications.   Procedures/Studies: DG CHEST PORT 1 VIEW  Result Date: 01/03/2023 CLINICAL DATA:  Shortness of breath. EXAM: PORTABLE CHEST 1 VIEW COMPARISON:  Chest radiograph dated 12/31/2022. FINDINGS: No focal consolidation, pleural effusion, or pneumothorax. The cardiac silhouette is within normal limits. Median sternotomy wires and mechanical cardiac valve. Right pectoral pacemaker device. No acute osseous pathology. IMPRESSION: No active disease. Electronically Signed   By: Elgie Collard M.D.   On: 01/03/2023 20:43   CARDIAC CATHETERIZATION  Result Date: 01/03/2023   Mid LAD lesion is 100% stenosed.   Prox LAD to Mid LAD lesion is 90% stenosed.   Origin to Prox Graft lesion is 100% stenosed.   Prox RCA to Mid RCA lesion is 35%  stenosed.   2nd Mrg-2 lesion is 25% stenosed.   Mid Cx lesion is 90%  stenosed.   Prox Cx to Mid Cx lesion is 90% stenosed.   Non-stenotic Mid LM to Ost LAD lesion was previously treated.   Non-stenotic 2nd Mrg-1 lesion was previously treated.   A drug-eluting stent was successfully placed using a SYNERGY XD 3.0X28.   Post intervention, there is a 0% residual stenosis.   Post intervention, there is a 0% residual stenosis.   Recommend dual antiplatelet therapy with Aspirin 81mg  daily and Clopidogrel 75mg  daily long-term (beyond 12 months) because of multiple stents. 2 vessel obstructive CAD. 100% LAD. This fills by LIMA. Patent left main stent. Focal severe in stent restenosis of the proximal LCx prior to OM1. 90% new stenosis in mid LCx after OM1. Known occlusion of SVG to LCx Patent LIMA to LAD Successful PCI of the proximal to mid LCx with DES across the first OM essentially resulting in bifurcation stenting with prior stents. Plan: DAPT indefinitely. Possible DC tomorrow depending on clinical course  ECHOCARDIOGRAM COMPLETE  Result Date: 01/01/2023    ECHOCARDIOGRAM REPORT   Patient Name:   [name] [facility] Date of Exam: 01/01/2023 Medical Rec #:  [id number]       Height:       66.5 in Accession #:    [id number]      Weight:       185.6 lb Date of Birth:  1945-10-11      BSA:          1.948 m Patient Age:    77 years        BP:           130/81 mmHg Patient Gender: M               HR:           74 bpm. Exam Location:  Inpatient Procedure: 2D Echo, Cardiac Doppler, Color Doppler and Intracardiac            Opacification Agent Indications:    Chest Pain R07.9  History:        Patient has prior history of Echocardiogram examinations, most                 recent 05/26/2022. CAD and Previous Myocardial Infarction,                 Pacemaker and Prior CABG, Carotid Disease, Aortic Valve Disease;                 Risk Factors:Diabetes, Hypertension and Former Smoker.                 Aortic Valve: St. Jude mechanical  valve is present in the aortic                 position. Procedure Date: 2005.  Sonographer:    Dondra Prader RVT RCS Referring Phys: [phone] [facility]  Sonographer Comments: Suboptimal apical window. IMPRESSIONS  1. Left ventricular ejection fraction, by estimation, is 50 to 55%. The left ventricle has low normal function. The left ventricle demonstrates regional wall motion abnormalities (see scoring diagram/findings for description). The left ventricular internal cavity size was mildly dilated. Left ventricular diastolic parameters are indeterminate. There is mild hypokinesis of the left ventricular, mid-apical inferior wall. There is hypokinesis of the left ventricular, apical septal wall.  2. Right ventricular systolic function is normal. The right ventricular size is normal.  3. Right atrial size was moderately dilated.  4. The mitral valve is grossly normal. Mild mitral valve regurgitation. No  evidence of mitral stenosis.  5. The aortic valve has been repaired/replaced. Aortic valve regurgitation is trivial. There is a St. Jude mechanical valve present in the aortic position. Procedure Date: 2005.  6. Aortic root/ascending aorta has been repaired/replaced.  7. The inferior vena cava is normal in size with greater than 50% respiratory variability, suggesting right atrial pressure of 3 mmHg. Comparison(s): No significant change from prior study. Conclusion(s)/Recommendation(s): No left ventricular mural or apical thrombus/thrombi. FINDINGS  Left Ventricle: Left ventricular ejection fraction, by estimation, is 50 to 55%. The left ventricle has low normal function. The left ventricle demonstrates regional wall motion abnormalities. Mild hypokinesis of the left ventricular, mid-apical inferior wall. Definity contrast agent was given IV to delineate the left ventricular endocardial borders. The left ventricular internal cavity size was mildly dilated. There is borderline left ventricular hypertrophy. Abnormal  (paradoxical) septal motion, consistent with RV pacemaker. Left ventricular diastolic parameters are indeterminate. Right Ventricle: The right ventricular size is normal. No increase in right ventricular wall thickness. Right ventricular systolic function is normal. Left Atrium: Left atrial size was normal in size. Right Atrium: Right atrial size was moderately dilated. Pericardium: There is no evidence of pericardial effusion. Mitral Valve: The mitral valve is grossly normal. Mild mitral valve regurgitation. No evidence of mitral valve stenosis. Tricuspid Valve: The tricuspid valve is normal in structure. Tricuspid valve regurgitation is trivial. No evidence of tricuspid stenosis. Aortic Valve: The aortic valve has been repaired/replaced. Aortic valve regurgitation is trivial. Aortic valve mean gradient measures 5.0 mmHg. Aortic valve peak gradient measures 9.2 mmHg. Aortic valve area, by VTI measures 2.54 cm. There is a St. Jude  mechanical valve present in the aortic position. Procedure Date: 2005. Pulmonic Valve: The pulmonic valve was not well visualized. Pulmonic valve regurgitation is not visualized. No evidence of pulmonic stenosis. Aorta: The aortic root/ascending aorta has been repaired/replaced. Venous: The inferior vena cava is normal in size with greater than 50% respiratory variability, suggesting right atrial pressure of 3 mmHg. IAS/Shunts: The atrial septum is grossly normal. Additional Comments: A device lead is visualized in the right ventricle.  LEFT VENTRICLE PLAX 2D LVIDd:         5.30 cm   Diastology LVIDs:         3.80 cm   LV e' medial:    8.38 cm/s LV PW:         0.90 cm   LV E/e' medial:  10.1 LV IVS:        1.10 cm   LV e' lateral:   6.42 cm/s LVOT diam:     1.80 cm   LV E/e' lateral: 13.1 LV SV:         79 LV SV Index:   41 LVOT Area:     2.54 cm  RIGHT VENTRICLE            IVC RV Basal diam:  3.50 cm    IVC diam: 1.50 cm RV S prime:     8.40 cm/s TAPSE (M-mode): 2.0 cm LEFT ATRIUM            Index        RIGHT ATRIUM           Index LA diam:      4.00 cm 2.05 cm/m   RA Area:     23.00 cm LA Vol (A4C): 33.2 ml 17.07 ml/m  RA Volume:   77.50 ml  39.78 ml/m  AORTIC VALVE  PULMONIC VALVE AV Area (Vmax):    2.40 cm      PV Vmax:       0.92 m/s AV Area (Vmean):   2.33 cm      PV Peak grad:  3.4 mmHg AV Area (VTI):     2.54 cm AV Vmax:           151.67 cm/s AV Vmean:          101.967 cm/s AV VTI:            0.313 m AV Peak Grad:      9.2 mmHg AV Mean Grad:      5.0 mmHg LVOT Vmax:         143.00 cm/s LVOT Vmean:        93.300 cm/s LVOT VTI:          0.312 m LVOT/AV VTI ratio: 1.00  AORTA Ao Root diam: 3.20 cm Ao Asc diam:  3.80 cm MITRAL VALVE MV Area (PHT): 3.34 cm    SHUNTS MV Decel Time: 227 msec    Systemic VTI:  0.31 m MV E velocity: 84.40 cm/s  Systemic Diam: 1.80 cm MV A velocity: 97.10 cm/s MV E/A ratio:  0.87 Jodelle Red MD Electronically signed by Jodelle Red MD Signature Date/Time: 01/01/2023/2:52:48 PM    Final    DG Chest Port 1 View  Result Date: 12/31/2022 CLINICAL DATA:  Chest pain. EXAM: PORTABLE CHEST 1 VIEW COMPARISON:  May 25, 2022 FINDINGS: There is stable dual lead AICD positioning. Multiple sternal wires and vascular clips are noted. The heart size and mediastinal contours are within normal limits. An artificial aortic valve is seen. Mild, chronic appearing increased lung markings are seen without evidence of an acute infiltrate, pleural effusion or pneumothorax. A radiopaque surgical screw is seen overlying the left humeral head. Multilevel degenerative changes are seen throughout the thoracic spine. IMPRESSION: 1. Evidence of prior median sternotomy/CABG and artificial aortic valve placement. 2. No acute or active cardiopulmonary disease. Electronically Signed   By: Aram Candela M.D.   On: 12/31/2022 22:29     The results of significant diagnostics from this hospitalization (including imaging, microbiology, ancillary  and laboratory) are listed below for reference.     Microbiology: No results found for this or any previous visit (from the past 240 hour(s)).   Labs: BNP (last 3 results) Recent Labs    01/03/23 2021  BNP 210.8*   Basic Metabolic Panel: Recent Labs  Lab 01/02/23 0658 01/03/23 0306 01/03/23 2006 01/04/23 0157 01/05/23 0246 01/06/23 0254  NA 137 135  --  136 134* 136  K 3.6 3.8  --  3.6 3.5 3.6  CL 105 104  --  104 103 103  CO2 24 25  --  22 25 27   GLUCOSE 94 101*  --  101* 103* 110*  BUN 13 14  --  12 14 12   CREATININE 0.80 0.75 0.88 0.88 0.85 0.92  CALCIUM 8.5* 8.5*  --  8.3* 8.2* 8.3*  MG  --   --   --   --   --  2.0   Liver Function Tests: Recent Labs  Lab 01/01/23 0804  AST 33  ALT 22  ALKPHOS 38  BILITOT 1.2  PROT 5.4*  ALBUMIN 3.4*   No results for input(s): "LIPASE", "AMYLASE" in the last 168 hours. No results for input(s): "AMMONIA" in the last 168 hours. CBC: Recent Labs  Lab 01/03/23 0306 01/03/23 2006 01/04/23 0157 01/05/23 0246  01/06/23 0254  WBC 7.5 17.5* 13.6* 8.3 6.5  HGB 12.2* 13.9 13.0 11.4* 11.0*  HCT 36.9* 42.3 39.1 34.3* 34.3*  MCV 90.7 94.0 91.1 91.2 92.5  PLT 183 188 184 164 160   Cardiac Enzymes: No results for input(s): "CKTOTAL", "CKMB", "CKMBINDEX", "TROPONINI" in the last 168 hours. BNP: Invalid input(s): "POCBNP" CBG: No results for input(s): "GLUCAP" in the last 168 hours. D-Dimer No results for input(s): "DDIMER" in the last 72 hours. Hgb A1c No results for input(s): "HGBA1C" in the last 72 hours. Lipid Profile No results for input(s): "CHOL", "HDL", "LDLCALC", "TRIG", "CHOLHDL", "LDLDIRECT" in the last 72 hours. Thyroid function studies No results for input(s): "TSH", "T4TOTAL", "T3FREE", "THYROIDAB" in the last 72 hours.  Invalid input(s): "FREET3" Anemia work up No results for input(s): "VITAMINB12", "FOLATE", "FERRITIN", "TIBC", "IRON", "RETICCTPCT" in the last 72 hours. Urinalysis    Component Value  Date/Time   COLORURINE YELLOW 01/26/2016 2031   APPEARANCEUR CLEAR 01/26/2016 2031   LABSPEC 1.016 01/26/2016 2031   PHURINE 6.0 01/26/2016 2031   GLUCOSEU NEGATIVE 01/26/2016 2031   HGBUR NEGATIVE 01/26/2016 2031   HGBUR negative 09/17/2009 0800   BILIRUBINUR NEGATIVE 01/26/2016 2031   BILIRUBINUR n 04/23/2015 1002   KETONESUR NEGATIVE 01/26/2016 2031   PROTEINUR NEGATIVE 01/26/2016 2031   UROBILINOGEN 0.2 04/23/2015 1002   UROBILINOGEN 0.2 02/14/2013 0910   NITRITE NEGATIVE 01/26/2016 2031   LEUKOCYTESUR NEGATIVE 01/26/2016 2031   Sepsis Labs Recent Labs  Lab 01/03/23 2006 01/04/23 0157 01/05/23 0246 01/06/23 0254  WBC 17.5* 13.6* 8.3 6.5   Microbiology No results found for this or any previous visit (from the past 240 hour(s)).   Time coordinating discharge:  I have spent 35 minutes face to face with the patient and on the ward discussing the patients care, assessment, plan and disposition with other care givers. >50% of the time was devoted counseling the patient about the risks and benefits of treatment/Discharge disposition and coordinating care.   SIGNED:   Dimple Nanas, MD  [facility] 01/06/2023, 11:27 AM   If 7PM-7AM, please contact night-coverage

## 2023-01-06 NOTE — Plan of Care (Signed)
Problem: Education: Goal: Knowledge of General Education information will improve Description: Including pain rating scale, medication(s)/side effects and non-pharmacologic comfort measures 01/06/2023 0901 by Wolfe Camarena L, RN Outcome: Adequate for Discharge 01/06/2023 0831 by Jaelah Hauth L, RN Outcome: Progressing   Problem: Health Behavior/Discharge Planning: Goal: Ability to manage health-related needs will improve 01/06/2023 0901 by Devonna Oboyle L, RN Outcome: Adequate for Discharge 01/06/2023 0831 by Grayling Schranz L, RN Outcome: Progressing   Problem: Clinical Measurements: Goal: Ability to maintain clinical measurements within normal limits will improve 01/06/2023 0901 by Johari Pinney L, RN Outcome: Adequate for Discharge 01/06/2023 0831 by Denny Mccree L, RN Outcome: Progressing Goal: Will remain free from infection 01/06/2023 0901 by Zoii Florer L, RN Outcome: Adequate for Discharge 01/06/2023 0831 by Bryna Razavi L, RN Outcome: Progressing Goal: Diagnostic test results will improve 01/06/2023 0901 by Athira Janowicz L, RN Outcome: Adequate for Discharge 01/06/2023 0831 by Machel Violante L, RN Outcome: Progressing Goal: Respiratory complications will improve 01/06/2023 0901 by Thelma Viana L, RN Outcome: Adequate for Discharge 01/06/2023 0831 by Tahirah Sara L, RN Outcome: Progressing Goal: Cardiovascular complication will be avoided 01/06/2023 0901 by Navayah Sok L, RN Outcome: Adequate for Discharge 01/06/2023 0831 by Jamarria Real L, RN Outcome: Progressing   Problem: Activity: Goal: Risk for activity intolerance will decrease 01/06/2023 0901 by Royalti Schauf L, RN Outcome: Adequate for Discharge 01/06/2023 0831 by Eyoel Throgmorton L, RN Outcome: Progressing   Problem: Nutrition: Goal: Adequate nutrition will be maintained 01/06/2023 0901 by Cele Mote L, RN Outcome: Adequate for Discharge 01/06/2023 0831 by Clytie Shetley L, RN Outcome: Progressing    Problem: Coping: Goal: Level of anxiety will decrease 01/06/2023 0901 by Kamal Jurgens L, RN Outcome: Adequate for Discharge 01/06/2023 0831 by Magdelena Kinsella L, RN Outcome: Progressing   Problem: Elimination: Goal: Will not experience complications related to bowel motility 01/06/2023 0901 by Ilario Dhaliwal L, RN Outcome: Adequate for Discharge 01/06/2023 0831 by Wright Gravely L, RN Outcome: Progressing Goal: Will not experience complications related to urinary retention 01/06/2023 0901 by Dean Goldner L, RN Outcome: Adequate for Discharge 01/06/2023 0831 by Fenix Ruppe L, RN Outcome: Progressing   Problem: Pain Managment: Goal: General experience of comfort will improve 01/06/2023 0901 by Roddie Riegler L, RN Outcome: Adequate for Discharge 01/06/2023 0831 by Taimane Stimmel L, RN Outcome: Progressing   Problem: Safety: Goal: Ability to remain free from injury will improve 01/06/2023 0901 by Oscar Hank L, RN Outcome: Adequate for Discharge 01/06/2023 0831 by Angelita Harnack L, RN Outcome: Progressing   Problem: Skin Integrity: Goal: Risk for impaired skin integrity will decrease 01/06/2023 0901 by Shandi Godfrey L, RN Outcome: Adequate for Discharge 01/06/2023 0831 by Yonas Bunda L, RN Outcome: Progressing   Problem: Education: Goal: Understanding of CV disease, CV risk reduction, and recovery process will improve 01/06/2023 0901 by Magdalen Cabana L, RN Outcome: Adequate for Discharge 01/06/2023 0831 by Mairen Wallenstein L, RN Outcome: Progressing Goal: Individualized Educational Video(s) 01/06/2023 0901 by Brylee Berk L, RN Outcome: Adequate for Discharge 01/06/2023 0831 by Amye Grego L, RN Outcome: Progressing   Problem: Activity: Goal: Ability to return to baseline activity level will improve 01/06/2023 0901 by Dawsyn Zurn L, RN Outcome: Adequate for Discharge 01/06/2023 0831 by Victorine Mcnee L, RN Outcome: Progressing   Problem: Cardiovascular: Goal: Ability to  achieve and maintain adequate cardiovascular perfusion will improve 01/06/2023 0901 by Yochanan Eddleman L, RN Outcome: Adequate for Discharge 01/06/2023 0831 by Dennise Bamber L, RN Outcome: Progressing Goal: Vascular access   site(s) Level 0-1 will be maintained 01/06/2023 0901 by Serin Thornell L, RN Outcome: Adequate for Discharge 01/06/2023 0831 by Lylia Karn L, RN Outcome: Progressing   Problem: Health Behavior/Discharge Planning: Goal: Ability to safely manage health-related needs after discharge will improve 01/06/2023 0901 by Afiya Ferrebee L, RN Outcome: Adequate for Discharge 01/06/2023 0831 by Mikal Wisman L, RN Outcome: Progressing   

## 2023-01-07 ENCOUNTER — Telehealth: Payer: Self-pay

## 2023-01-07 NOTE — Transitions of Care (Post Inpatient/ED Visit) (Signed)
01/07/2023  Name: [name] MRN: [id number] DOB: [birth year]  Today's TOC FU Call Status: Today's TOC FU Call Status:: Successful TOC FU Call Competed TOC FU Call Complete Date: 01/07/23 (Call completed with wife. Pt currently eating his meal.)  Transition Care Management Follow-up Telephone Call Date of Discharge: 01/06/23 Discharge Facility: [facility]) Type of Discharge: Inpatient Admission Primary Inpatient Discharge Diagnosis:: "unstable angina" How have you been since you were released from the hospital?: Better Any questions or concerns?: No  Items Reviewed: Did you receive and understand the discharge instructions provided?: Yes Medications obtained,verified, and reconciled?: Yes (Medications Reviewed) Any new allergies since your discharge?: No Dietary orders reviewed?: Yes Type of Diet Ordered:: low salt/heart healthy/carb modified Do you have support at home?: Yes People in Home: spouse Name of Support/Comfort Primary Source: Jeanetta  Medications Reviewed Today: Medications Reviewed Today     Reviewed by Charlyn Minerva, RN (Registered Nurse) on 01/07/23 at 1157  Med List Status: <None>   Medication Order Taking? Sig Documenting Provider Last Dose Status Informant  ACCU-CHEK GUIDE test strip [id number] Yes USE AS INSTRUCTED Nafziger, Kandee Keen, NP Taking Active Spouse/Significant Other  Accu-Chek Softclix Lancets lancets [id number] Yes USE TO CHECK BLOOD SUGAR TWICE A DAY AND AS NEEDED Nafziger, Kandee Keen, NP Taking Active Spouse/Significant Other  acetaminophen (TYLENOL) 650 MG CR tablet [id number] Yes Take 1,300 mg by mouth 2 (two) times daily. [provider] Taking Active Spouse/Significant Other  albuterol (VENTOLIN HFA) 108 (90 Base) MCG/ACT inhaler [id number] Yes INHALE 2 PUFFS INTO THE LUNGS EVERY 6 HOURS AS NEEDED FOR WHEEZING OR SHORTNESS OF BREATH.  Patient taking differently: Inhale 2 puffs into the lungs every 6 (six) hours as needed for  wheezing or shortness of breath.   Nafziger, Kandee Keen, NP Taking Active Spouse/Significant Other  Ascorbic Acid (VITAMIN C) 1000 MG tablet [id number] Yes Take 1,000 mg by mouth daily. [provider] Taking Active Spouse/Significant Other  aspirin 81 MG chewable tablet [id number] Yes Chew 1 tablet (81 mg total) by mouth daily. Dimple Nanas, MD Taking Active   atorvastatin (LIPITOR) 80 MG tablet [id number] Yes Take 1 tablet (80 mg total) by mouth daily. Jake Bathe, MD Taking Active Spouse/Significant Other  calcium carbonate (OS-CAL) 600 MG TABS [id number] Yes Take 600 mg by mouth 2 (two) times daily with a meal. [provider] Taking Active Spouse/Significant Other  carbidopa-levodopa (SINEMET CR) 50-200 MG tablet [id number] Yes Take 1 tablet by mouth at bedtime. [provider] Taking Active Spouse/Significant Other  carbidopa-levodopa (SINEMET IR) 25-100 MG tablet [id number] Yes Take 1 tablet by mouth 4 (four) times daily. [provider] Taking Active Spouse/Significant Other  cetirizine (ZYRTEC) 10 MG tablet [id number] Yes Take 10 mg by mouth daily.  [provider] Taking Active Spouse/Significant Other  cholecalciferol (VITAMIN D) 25 MCG (1000 UNIT) tablet [id number] Yes Take 1,000 Units by mouth every evening. [provider] Taking Active Spouse/Significant Other  clopidogrel (PLAVIX) 75 MG tablet [id number] Yes Take 1 tablet (75 mg total) by mouth daily. Jake Bathe, MD Taking Active Spouse/Significant Other  fluticasone (FLONASE) 50 MCG/ACT nasal spray [id number] Yes Place 2 sprays into both nostrils daily.  Patient taking differently: Place 2 sprays into both nostrils daily as needed for allergies or rhinitis.   Nafziger, Kandee Keen, NP Taking Active Spouse/Significant Other  furosemide (LASIX) 40 MG tablet [id number] Yes TAKE 1 TABLET EVERY DAY  Patient taking differently: Take 40 mg by mouth daily.   [name], NP Taking  Active  Spouse/Significant Other  isosorbide mononitrate (IMDUR) 30 MG 24 hr tablet [id number] Yes Take 0.5 tablets (15 mg total) by mouth daily. Dimple Nanas, MD Taking Active   melatonin 5 MG TABS [id number] Yes Take 5 mg by mouth at bedtime. [provider] Taking Active Spouse/Significant Other  metFORMIN (GLUCOPHAGE) 500 MG tablet [id number] Yes TAKE 1/2 TABLET EVERY DAY WITH BREAKFAST (SCHEDULE APPT FOR FUTURE REFILLS)  Patient taking differently: Take 250 mg by mouth daily with breakfast. TAKE 1/2 TABLET EVERY DAY WITH BREAKFAST (SCHEDULE APPT FOR FUTURE REFILLS)   [name], NP Taking Active Spouse/Significant Other  metoprolol succinate (TOPROL-XL) 50 MG 24 hr tablet [id number] Yes Take 1 tablet (50 mg total) by mouth daily. Take with or immediately following a meal. Jake Bathe, MD Taking Active Spouse/Significant Other  Misc Natural Products [facility] ADVANCED) CAPS [id number] Yes Take 2 capsules by mouth daily.  [provider] Taking Active Spouse/Significant Other  Multiple Vitamin (MULTIVITAMIN) tablet [id number] Yes Take 1 tablet by mouth daily.   [provider] Taking Active Spouse/Significant Other  nitroGLYCERIN (NITROSTAT) 0.4 MG SL tablet [id number]  PLACE 1 TABLET (0.4 MG TOTAL) UNDER THE TONGUE EVERY 5 (FIVE) MINUTES AS NEEDED. FOR CHEST PAIN  Patient taking differently: Place 0.4 mg under the tongue every 5 (five) minutes as needed for chest pain. For chest pain   Jake Bathe, MD  Active Spouse/Significant Other  potassium chloride (KLOR-CON) 10 MEQ tablet [id number] Yes TAKE 1 TABLET EVERY DAY  Patient taking differently: Take 10 mEq by mouth daily.   [name], NP Taking Active Spouse/Significant Other  Probiotic Product (PHILLIPS COLON HEALTH PO) [id number] Yes Take 1 capsule by mouth daily. [provider] Taking Active Spouse/Significant Other  vitamin B-12 (CYANOCOBALAMIN) 1000 MCG tablet [id number] Yes Take 1,000 mcg by mouth  daily. [provider] Taking Active Spouse/Significant Other  warfarin (COUMADIN) 10 MG tablet [id number] Yes TAKE 1/2 TO 1 TABLET DAILY OR AS DIRECTED BY COUMADIN CLINIC  Patient taking differently: Take 10 mg by mouth. Take 1/2 Sunday, Tuesday and Thursday and full tablet on all other days   Jake Bathe, MD Taking Active Spouse/Significant Other            Home Care and Equipment/Supplies: Were Home Health Services Ordered?: NA Any new equipment or medical supplies ordered?: NA  Functional Questionnaire: Do you need assistance with bathing/showering or dressing?: No Do you need assistance with meal preparation?: No Do you need assistance with eating?: No Do you have difficulty maintaining continence: No Do you need assistance with getting out of bed/getting out of a chair/moving?: No Do you have difficulty managing or taking your medications?: No  Follow up appointments reviewed: PCP Follow-up appointment confirmed?: Yes Date of PCP follow-up appointment?: 01/20/23 Follow-up Provider: [facility] Follow-up appointment confirmed?: Yes Date of Specialist follow-up appointment?: 01/19/23 Follow-Up Specialty Provider:: Dr. Anne Fu,  [facility]-01/14/23 Do you need transportation to your follow-up appointment?: No (wife confirms she is able to drive him to appts) Do you understand care options if your condition(s) worsen?: Yes-patient verbalized understanding  SDOH Interventions Today    Flowsheet Row Most Recent Value  SDOH Interventions   Food Insecurity Interventions Intervention Not Indicated  Transportation Interventions Intervention Not Indicated      TOC Interventions Today    Flowsheet Row Most Recent Value  TOC Interventions   TOC Interventions Discussed/Reviewed TOC Interventions Discussed, Arranged PCP follow up less than 12 days/Care Guide scheduled  Interventions Today    Flowsheet Row Most Recent Value  General  Interventions   General Interventions Discussed/Reviewed General Interventions Discussed, Doctor Visits, Durable Medical Equipment (DME)  Doctor Visits Discussed/Reviewed Doctor Visits Discussed, Specialist, PCP  Durable Medical Equipment (DME) Glucomoter  [wife states they have not checked cbgs since returning home but will do so shortly]  PCP/Specialist Visits Compliance with follow-up visit  Education Interventions   Education Provided Provided Education  Provided Verbal Education On Nutrition, When to see the doctor, Medication, Blood Sugar Monitoring, Other  Nutrition Interventions   Nutrition Discussed/Reviewed Nutrition Discussed, Adding fruits and vegetables, Decreasing salt, Fluid intake, Increasing proteins, Decreasing fats  Pharmacy Interventions   Pharmacy Dicussed/Reviewed Pharmacy Topics Discussed, Medications and their functions  Safety Interventions   Safety Discussed/Reviewed Safety Discussed       Alessandra Grout [facility] Care Management Care Management Community Coordinator Direct Phone: [phone] Toll Free: [phone] Fax: [phone]

## 2023-01-14 ENCOUNTER — Ambulatory Visit: Payer: Medicare HMO | Attending: Cardiovascular Disease

## 2023-01-14 DIAGNOSIS — Z5181 Encounter for therapeutic drug level monitoring: Secondary | ICD-10-CM | POA: Diagnosis not present

## 2023-01-14 DIAGNOSIS — Z09 Encounter for follow-up examination after completed treatment for conditions other than malignant neoplasm: Secondary | ICD-10-CM

## 2023-01-14 DIAGNOSIS — Z952 Presence of prosthetic heart valve: Secondary | ICD-10-CM | POA: Diagnosis not present

## 2023-01-14 LAB — POCT INR: INR: 1.6 — AB (ref 2.0–3.0)

## 2023-01-14 NOTE — Patient Instructions (Signed)
TAKE 2 TABLETS TONIGHT ONLY THEN continue taking warfarin 1 tablet daily except for 1/2 tablet on Sundays, Tuesdays, and Thursdays. Remain consistent with green leafy veggies. Recheck INR in 3 weeks.  [facility] [phone] or [phone]

## 2023-01-19 ENCOUNTER — Encounter: Payer: Self-pay | Admitting: Cardiology

## 2023-01-19 ENCOUNTER — Ambulatory Visit: Payer: Medicare HMO | Attending: Cardiology | Admitting: Cardiology

## 2023-01-19 VITALS — BP 100/58 | HR 83 | Ht 66.5 in | Wt 184.6 lb

## 2023-01-19 DIAGNOSIS — I25119 Atherosclerotic heart disease of native coronary artery with unspecified angina pectoris: Secondary | ICD-10-CM

## 2023-01-19 DIAGNOSIS — Z952 Presence of prosthetic heart valve: Secondary | ICD-10-CM

## 2023-01-19 NOTE — Patient Instructions (Signed)
Medication Instructions:  Please discontinue Asprin August 1st. Continue all other medications as listed.  *If you need a refill on your cardiac medications before your next appointment, please call your pharmacy*  Follow-Up: At [facility], you and your health needs are our priority.  As part of our continuing mission to provide you with exceptional heart care, we have created designated Provider Care Teams.  These Care Teams include your primary Cardiologist (physician) and Advanced Practice Providers (APPs -  Physician Assistants and Nurse Practitioners) who all work together to provide you with the care you need, when you need it.  We recommend signing up for the patient portal called "MyChart".  Sign up information is provided on this After Visit Summary.  MyChart is used to connect with patients for Virtual Visits (Telemedicine).  Patients are able to view lab/test results, encounter notes, upcoming appointments, etc.  Non-urgent messages can be sent to your provider as well.   To learn more about what you can do with MyChart, go to [email or web address].    Your next appointment:   6 month(s)  Provider:   Donato Schultz, MD

## 2023-01-19 NOTE — Progress Notes (Signed)
  Cardiology Office Note:  .   Date:  01/19/2023  ID:  [name], DOB 08/05/1945, MRN [id number] PCP: [name], NP  [facility] Providers Cardiologist:  Donato Schultz, MD Electrophysiologist:  Lewayne Bunting, MD    History of Present Illness: .   [name] is a 77 y.o. male here for postcatheterization follow-up.  Overall feels well.  Not complaining of any further chest discomfort.  Not taking any nitroglycerin.  ROS: Parkinsonian tremor noted  Studies Reviewed: Marland Kitchen        Cath 01/03/23 with focal severe ISR of the proximal LCx prior to OM1/90% mLCx, treated with PCI/DES of the proximal to mid LCx across the first OM essentially resulting in bifurcation stenting with prior stents, patent LIMA-LAD, known occlusion of SVG-LCx   Risk Assessment/Calculations:             Physical Exam:   VS:  BP (!) 100/58   Pulse 83   Ht 5' 6.5" (1.689 m)   Wt 184 lb 9.6 oz (83.7 kg)   SpO2 96%   BMI 29.35 kg/m    Wt Readings from Last 3 Encounters:  01/19/23 184 lb 9.6 oz (83.7 kg)  01/04/23 184 lb 14.4 oz (83.9 kg)  11/19/22 190 lb (86.2 kg)    GEN: Well nourished, well developed in no acute distress NECK: No JVD; No carotid bruits CARDIAC: RRR, S2 click, no murmurs, rubs, gallops RESPIRATORY:  Clear to auscultation without rales, wheezing or rhonchi  ABDOMEN: Soft, non-tender, non-distended EXTREMITIES:  No edema; No deformity   ASSESSMENT AND PLAN: .    Coronary artery disease - In-stent restenosis circumflex, stent placed late June 2024. - Lets continue aspirin until August 1 and then continue Plavix and warfarin lifelong.  Mechanical aortic valve -Coumadin, continue with close follow-up.  Parkinson's disease - Tremor noted on exam.  Hyperlipidemia - Continue with atorvastatin 80 mg a day.  Last LDL 63.     Cardiac Rehabilitation Eligibility Assessment        Dispo: 6 months with APP  Signed, Donato Schultz, MD

## 2023-01-20 ENCOUNTER — Ambulatory Visit (INDEPENDENT_AMBULATORY_CARE_PROVIDER_SITE_OTHER): Payer: Medicare HMO | Admitting: Adult Health

## 2023-01-20 ENCOUNTER — Encounter: Payer: Self-pay | Admitting: Adult Health

## 2023-01-20 VITALS — BP 120/68 | HR 84 | Temp 98.6°F | Ht 66.5 in | Wt 184.0 lb

## 2023-01-20 DIAGNOSIS — E785 Hyperlipidemia, unspecified: Secondary | ICD-10-CM | POA: Diagnosis not present

## 2023-01-20 DIAGNOSIS — I25810 Atherosclerosis of coronary artery bypass graft(s) without angina pectoris: Secondary | ICD-10-CM | POA: Diagnosis not present

## 2023-01-20 DIAGNOSIS — I5032 Chronic diastolic (congestive) heart failure: Secondary | ICD-10-CM | POA: Diagnosis not present

## 2023-01-20 DIAGNOSIS — I10 Essential (primary) hypertension: Secondary | ICD-10-CM

## 2023-01-20 DIAGNOSIS — I200 Unstable angina: Secondary | ICD-10-CM | POA: Diagnosis not present

## 2023-01-20 DIAGNOSIS — G20A1 Parkinson's disease without dyskinesia, without mention of fluctuations: Secondary | ICD-10-CM

## 2023-01-20 DIAGNOSIS — E1169 Type 2 diabetes mellitus with other specified complication: Secondary | ICD-10-CM

## 2023-01-20 LAB — BASIC METABOLIC PANEL
BUN: 15 mg/dL (ref 6–23)
CO2: 31 mEq/L (ref 19–32)
Calcium: 9.5 mg/dL (ref 8.4–10.5)
Chloride: 103 mEq/L (ref 96–112)
Creatinine, Ser: 0.86 mg/dL (ref 0.40–1.50)
GFR: 82.9 mL/min (ref >60.00)
Glucose, Bld: 161 mg/dL — ABNORMAL HIGH (ref 70–99)
Potassium: 4 mEq/L (ref 3.5–5.1)
Sodium: 139 mEq/L (ref 135–145)

## 2023-01-20 LAB — CBC
HCT: 41.4 % (ref 39.0–52.0)
Hemoglobin: 13.8 g/dL (ref 13.0–17.0)
MCHC: 33.3 g/dL (ref 30.0–36.0)
MCV: 92.3 fl (ref 78.0–100.0)
Platelets: 253 10*3/uL (ref 150.0–400.0)
RBC: 4.48 Mil/uL (ref 4.22–5.81)
RDW: 14.9 % (ref 11.5–15.5)
WBC: 8.1 10*3/uL (ref 4.0–10.5)

## 2023-01-20 LAB — HEMOGLOBIN A1C: Hgb A1c MFr Bld: 6.1 % (ref 4.6–6.5)

## 2023-01-20 NOTE — Progress Notes (Signed)
Subjective:    Patient ID: [name], male    DOB: 11-Oct-1945, 77 y.o.   MRN: [id number]  HPI 77 year old male who  has a past medical history of Acute renal failure (HCC) (02/09/2013), Aortic stenosis (08/25/2010), Arthritis, Asthma, AV BLOCK, COMPLETE, CAD (coronary artery disease), Carotid artery disease (HCC), Diabetes mellitus without complication (HCC), DIVERTICULOSIS, COLON (10/17/2007), Ejection fraction, GERD (gastroesophageal reflux disease), GOUT (01/16/2007), Headache, HEMORRHOIDS, INTERNAL (11/01/2008), adenomatous polyp of colon (02/2019), HYPERLIPIDEMIA (01/16/2007), HYPERTENSION (01/16/2007), Impaired glucose tolerance, LEG EDEMA (11/27/2008), Special screening for malignant neoplasm of prostate, and Warfarin anticoagulation.  He presents to the office today for TCM visit   Admit Date 12/31/2022 Discharge Date 01/06/2023  Presented to the emergency room at [facility] with several days of chest pain that was worse on exertion but ongoing even at rest, not subsiding with sublingual nitroglycerin at home.  On admission patient underwent cardiac catheterization on 01/03/2023 showing multivessel disease requiring a drug-eluting stent placement.  His echocardiogram showed an EF of 50 to 55%.  He was started on aspirin and Plavix and was advised to continue Coumadin as well.  He initially stayed in the hospital for Lovenox to Coumadin bridge.  On the day of discharge his INR was stable  Hospital Course  Acute Unstable Angina  -Status post left circumflex intervention.  Cardiology plan on dual antiplatelet therapy with aspirin and Plavix and continue Coumadin. -His cardiac catheterization was indicative of multivessel disease requiring drug-eluting stent placement.  June above his echocardiogram showed an EF of 50 to 55%, notable for left ventricular wall motion abnormality, dilation with an indeterminate diastolic parameters and noted hypokinesis in multiple walls of left  ventricle  Hypertension  -Maintained on Toprol XL.  Losartan on hold.  Added Imdur  Heart failure, grade 1 diastolic dysfunction -Continue metoprolol, ARB held.  Added Imdur  CAD -Continue Toprol 50 mg daily, Lipitor 80 mg daily, Plavix and aspirin  Parkinson's Disease -Baseline through admission.  Carbidopa/levodopa 50/200 mg at bedtime continued  DM type 2  - A1c - 6.4  - Continue Diabetic diet  HLD  - Continue Lipitor 80 mg daily   He has been seen by cardiology for follow-up after being discharged from the hospital.  Overall feels well.  No complaints of further chest pain.  He has not had to take any nitroglycerin since being discharged   Review of Systems  Constitutional: Negative.   HENT: Negative.    Eyes: Negative.   Respiratory: Negative.    Cardiovascular: Negative.   Gastrointestinal: Negative.   Endocrine: Negative.   Genitourinary: Negative.   Musculoskeletal: Negative.   Skin: Negative.   Allergic/Immunologic: Negative.   Neurological: Negative.   Hematological: Negative.   Psychiatric/Behavioral: Negative.    All other systems reviewed and are negative.  Past Medical History:  Diagnosis Date   Acute renal failure (HCC) 02/09/2013   Aortic stenosis 08/25/2010   a. Bentall aortic root replacement with a St. Jude mechanical valve and Hemashield conduit 02/2004.   Arthritis    Asthma    AV BLOCK, COMPLETE    a. s/p St Jude dual chamber pacemaker 02/2004.   CAD (coronary artery disease)    a. s/p CABGx2 (LIMA-dLAD, SVG-Cx). b. Low risk nuc 12/2012 without ischemia, EF 46% mild apical hypokinesia (EF 55% inf HK by echo).   Carotid artery disease (HCC)    a. 0-39% bilateral ICA stenosis, stable mild hard plaque in carotid bulbs. F/u 03/2014 recommended.   Diabetes  mellitus without complication (HCC)    type 2    DIVERTICULOSIS, COLON 10/17/2007   Ejection fraction    a. EF 55% with inf HK, mild MR by echo 12/2012.   GERD (gastroesophageal reflux disease)     hxof    GOUT 01/16/2007   Headache    hx of migraines    HEMORRHOIDS, INTERNAL 11/01/2008   Hx of adenomatous polyp of colon 02/2019   no recall needed   HYPERLIPIDEMIA 01/16/2007   HYPERTENSION 01/16/2007   Impaired glucose tolerance    LEG EDEMA 11/27/2008   Special screening for malignant neoplasm of prostate    Warfarin anticoagulation    AVR    Social History   Socioeconomic History   Marital status: Married    Spouse name: Not on file   Number of children: 3   Years of education: Not on file   Highest education level: Not on file  Occupational History   Occupation: retired  Tobacco Use   Smoking status: Former    Current packs/day: 0.00    Types: Cigarettes    Quit date: 07/12/1973    Years since quitting: 49.5   Smokeless tobacco: Never  Vaping Use   Vaping status: Never Used  Substance and Sexual Activity   Alcohol use: No   Drug use: No   Sexual activity: Not on file  Other Topics Concern   Not on file  Social History Narrative   The patient is married and retired   He reports 3 grown children   Former smoker, no alcohol or tobacco or drug use at this time   Social Determinants of Corporate investment banker Strain: Low Risk  (06/17/2022)   Overall Financial Resource Strain (CARDIA)    Difficulty of Paying Living Expenses: Not hard at all  Food Insecurity: No Food Insecurity (01/07/2023)   Hunger Vital Sign    Worried About Running Out of Food in the Last Year: Never true    Ran Out of Food in the Last Year: Never true  Transportation Needs: No Transportation Needs (01/07/2023)   PRAPARE - Administrator, Civil Service (Medical): No    Lack of Transportation (Non-Medical): No  Physical Activity: Inactive (06/17/2022)   Exercise Vital Sign    Days of Exercise per Week: 0 days    Minutes of Exercise per Session: 0 min  Stress: No Stress Concern Present (06/17/2022)   [employer] of Occupational Health - Occupational Stress Questionnaire     Feeling of Stress : Not at all  Social Connections: Socially Integrated (06/17/2022)   Social Connection and Isolation Panel [NHANES]    Frequency of Communication with Friends and Family: More than three times a week    Frequency of Social Gatherings with Friends and Family: More than three times a week    Attends Religious Services: More than 4 times per year    Active Member of [employer] or Organizations: Yes    Attends Banker Meetings: More than 4 times per year    Marital Status: Married  Catering manager Violence: Not At Risk (01/01/2023)   Humiliation, Afraid, Rape, and Kick questionnaire    Fear of Current or Ex-Partner: No    Emotionally Abused: No    Physically Abused: No    Sexually Abused: No    Past Surgical History:  Procedure Laterality Date   AORTIC VALVE REPLACEMENT     CARDIAC CATHETERIZATION  06/2003   CIRCUMCISION     COLONOSCOPY  CORONARY ARTERY BYPASS GRAFT     CORONARY ATHERECTOMY N/A 05/28/2022   Procedure: CORONARY ATHERECTOMY;  Surgeon: Swaziland, Peter M, MD;  Location: [facility] INVASIVE CV LAB;  Service: Cardiovascular;  Laterality: N/A;   CORONARY LITHOTRIPSY N/A 05/28/2022   Procedure: CORONARY LITHOTRIPSY;  Surgeon: Swaziland, Peter M, MD;  Location: [facility] INVASIVE CV LAB;  Service: Cardiovascular;  Laterality: N/A;   CORONARY STENT INTERVENTION N/A 05/28/2022   Procedure: CORONARY STENT INTERVENTION;  Surgeon: Swaziland, Peter M, MD;  Location: [facility] INVASIVE CV LAB;  Service: Cardiovascular;  Laterality: N/A;   CORONARY STENT INTERVENTION N/A 01/03/2023   Procedure: CORONARY STENT INTERVENTION;  Surgeon: Swaziland, Peter M, MD;  Location: [facility] INVASIVE CV LAB;  Service: Cardiovascular;  Laterality: N/A;   coronary stents      prior to bypass    EP IMPLANTABLE DEVICE N/A 05/06/2016   Procedure: PPM Generator Changeout;  Surgeon: Marinus Maw, MD;  Location: [facility] INVASIVE CV LAB;  Service: Cardiovascular;  Laterality: N/A;   ESOPHAGOGASTRODUODENOSCOPY     HERNIA  REPAIR     INGUINAL HERNIA REPAIR Bilateral 02/07/2013   Procedure: LAPAROSCOPIC BILATERAL INGUINAL HERNIA REPAIR;  Surgeon: Wilmon Arms. Corliss Skains, MD;  Location: [facility] ORS;  Service: General;  Laterality: Bilateral;   INSERTION OF MESH Bilateral 02/07/2013   Procedure: INSERTION OF MESH;  Surgeon: Wilmon Arms. Corliss Skains, MD;  Location: [facility] OR[facility];  Service: General;  Laterality: Bilateral;   LEFT HEART CATH AND CORS/GRAFTS ANGIOGRAPHY N/A 05/27/2022   Procedure: LEFT HEART CATH AND CORS/GRAFTS ANGIOGRAPHY;  Surgeon: Corky Crafts, MD;  Location: [facility] INVASIVE CV LAB;  Service: Cardiovascular;  Laterality: N/A;   LEFT HEART CATH AND CORS/GRAFTS ANGIOGRAPHY N/A 01/03/2023   Procedure: LEFT HEART CATH AND CORS/GRAFTS ANGIOGRAPHY;  Surgeon: Swaziland, Peter M, MD;  Location: [facility] INVASIVE CV LAB;  Service: Cardiovascular;  Laterality: N/A;   LUMBAR LAMINECTOMY/DECOMPRESSION MICRODISCECTOMY N/A 03/15/2018   Procedure: Complete decompressive lumbar laminectomy for spinal stenosis of L4-L5 and foraminotomy for L4-L5 root on right;  Surgeon: Ranee Gosselin, MD;  Location: [facility];  Service: Orthopedics;  Laterality: N/A;   PACEMAKER INSERTION     SHOULDER SURGERY Bilateral    SKIN BIOPSY  05/04/2019   BASAL CELL CARCINOMA//MID CENTRAL NASAL   SKIN BIOPSY  10/11/2019   Superficial basal cell carcinoma - left superior central forehead shave    Family History  Problem Relation Age of Onset   Heart attack Father    Kidney cancer Father    Hypertension Mother    Diabetes Mother    Diabetes Brother    Epilepsy Sister    Diabetes Sister    Diabetes Brother    Colon cancer Neg Hx    Rectal cancer Neg Hx    Stomach cancer Neg Hx    Esophageal cancer Neg Hx     Allergies  Allergen Reactions   Bee Venom Shortness Of Breath and Swelling   Codeine Phosphate Hives, Shortness Of Breath, Itching, Rash and Other (See Comments)    Tolerated multiple doses of IV morphine   Metoprolol Tartrate Other (See Comments)    loss of  vision. Can take the XL tablet not regular    Pramipexole Other (See Comments)    Hallucinations     Tramadol     Pt has hallucinations    Ramipril Rash and Cough    Current Outpatient Medications on File Prior to Visit  Medication Sig Dispense Refill   ACCU-CHEK GUIDE test strip USE AS INSTRUCTED 300 strip 3  Accu-Chek Softclix Lancets lancets USE TO CHECK BLOOD SUGAR TWICE A DAY AND AS NEEDED 300 each 3   acetaminophen (TYLENOL) 650 MG CR tablet Take 1,300 mg by mouth 2 (two) times daily.     albuterol (VENTOLIN HFA) 108 (90 Base) MCG/ACT inhaler INHALE 2 PUFFS INTO THE LUNGS EVERY 6 HOURS AS NEEDED FOR WHEEZING OR SHORTNESS OF BREATH. (Patient taking differently: Inhale 2 puffs into the lungs every 6 (six) hours as needed for wheezing or shortness of breath.) 3 each 0   Ascorbic Acid (VITAMIN C) 1000 MG tablet Take 1,000 mg by mouth daily.     aspirin 81 MG chewable tablet Chew 1 tablet (81 mg total) by mouth daily. 30 tablet 0   atorvastatin (LIPITOR) 80 MG tablet Take 1 tablet (80 mg total) by mouth daily. 90 tablet 3   calcium carbonate (OS-CAL) 600 MG TABS Take 600 mg by mouth 2 (two) times daily with a meal.     carbidopa-levodopa (SINEMET CR) 50-200 MG tablet Take 1 tablet by mouth at bedtime.     carbidopa-levodopa (SINEMET IR) 25-100 MG tablet Take 1 tablet by mouth 4 (four) times daily.     cetirizine (ZYRTEC) 10 MG tablet Take 10 mg by mouth daily.      cholecalciferol (VITAMIN D) 25 MCG (1000 UNIT) tablet Take 1,000 Units by mouth every evening.     clopidogrel (PLAVIX) 75 MG tablet Take 1 tablet (75 mg total) by mouth daily. 90 tablet 3   fluticasone (FLONASE) 50 MCG/ACT nasal spray Place 2 sprays into both nostrils daily. (Patient taking differently: Place 2 sprays into both nostrils daily as needed for allergies or rhinitis.) 16 g 6   furosemide (LASIX) 40 MG tablet TAKE 1 TABLET EVERY DAY (Patient taking differently: Take 40 mg by mouth daily.) 90 tablet 3   isosorbide  mononitrate (IMDUR) 30 MG 24 hr tablet Take 0.5 tablets (15 mg total) by mouth daily. 30 tablet 0   melatonin 5 MG TABS Take 5 mg by mouth at bedtime.     metFORMIN (GLUCOPHAGE) 500 MG tablet TAKE 1/2 TABLET EVERY DAY WITH BREAKFAST (SCHEDULE APPT FOR FUTURE REFILLS) (Patient taking differently: Take 250 mg by mouth daily with breakfast. TAKE 1/2 TABLET EVERY DAY WITH BREAKFAST (SCHEDULE APPT FOR FUTURE REFILLS)) 45 tablet 3   metoprolol succinate (TOPROL-XL) 50 MG 24 hr tablet Take 1 tablet (50 mg total) by mouth daily. Take with or immediately following a meal. 90 tablet 3   Misc Natural Products (TART CHERRY ADVANCED) CAPS Take 2 capsules by mouth daily.      Multiple Vitamin (MULTIVITAMIN) tablet Take 1 tablet by mouth daily.       nitroGLYCERIN (NITROSTAT) 0.4 MG SL tablet PLACE 1 TABLET (0.4 MG TOTAL) UNDER THE TONGUE EVERY 5 (FIVE) MINUTES AS NEEDED. FOR CHEST PAIN (Patient taking differently: Place 0.4 mg under the tongue every 5 (five) minutes as needed for chest pain. For chest pain) 75 tablet 2   potassium chloride (KLOR-CON) 10 MEQ tablet TAKE 1 TABLET EVERY DAY (Patient taking differently: Take 10 mEq by mouth daily.) 90 tablet 3   Probiotic Product (PHILLIPS COLON HEALTH PO) Take 1 capsule by mouth daily.     vitamin B-12 (CYANOCOBALAMIN) 1000 MCG tablet Take 1,000 mcg by mouth daily.     warfarin (COUMADIN) 10 MG tablet TAKE 1/2 TO 1 TABLET DAILY OR AS DIRECTED BY COUMADIN CLINIC (Patient taking differently: Take 10 mg by mouth. Take 1/2 Sunday, Tuesday and Thursday and  full tablet on all other days) 90 tablet 3   No current facility-administered medications on file prior to visit.    BP 120/68   Pulse 84   Temp 98.6 F (37 C) (Oral)   Ht 5' 6.5" (1.689 m)   Wt 184 lb (83.5 kg)   SpO2 96%   BMI 29.25 kg/m       Objective:   Physical Exam Vitals and nursing note reviewed.  Constitutional:      Appearance: Normal appearance.  Cardiovascular:     Rate and Rhythm: Normal  rate and regular rhythm.     Heart sounds: Normal heart sounds.  Pulmonary:     Effort: Pulmonary effort is normal.     Breath sounds: Normal breath sounds.  Musculoskeletal:        General: Normal range of motion.  Skin:    General: Skin is warm and dry.  Neurological:     Mental Status: He is alert and oriented to person, place, and time.     Motor: Tremor present.  Psychiatric:        Mood and Affect: Mood normal.        Behavior: Behavior normal.        Thought Content: Thought content normal.        Judgment: Judgment normal.       Assessment & Plan:  1. Unstable angina (HCC) - Reviewed labs, discharge instructions, imaging, and medication changes with the patient. All questions answered to the best of my ability.  - Angina has resolved  - Basic Metabolic Panel; Future - CBC; Future  2. Primary hypertension - Controlled. Continue with Metoprolol and Imdur  - Basic Metabolic Panel; Future - CBC; Future  3. Chronic diastolic heart failure (HCC) - Continue with Toprol and Lasix  - Basic Metabolic Panel; Future - CBC; Future  4. Coronary artery disease involving autologous vein coronary bypass graft without angina pectoris - Per Cardiology  - Basic Metabolic Panel; Future - CBC; Future  5. Parkinson's disease, unspecified whether dyskinesia present, unspecified whether manifestations fluctuate - Continue with current medications  - Basic Metabolic Panel; Future - CBC; Future  6. Type 2 diabetes mellitus with other specified complication, without long-term current use of insulin (HCC) - Consider adding agent  - Basic Metabolic Panel; Future - Hemoglobin A1c; Future - CBC; Future  7. Hyperlipidemia, unspecified hyperlipidemia type - Continue with statin  - Basic Metabolic Panel; Future - CBC; Future  [name], NP

## 2023-01-24 ENCOUNTER — Ambulatory Visit: Payer: Medicare HMO | Admitting: Nurse Practitioner

## 2023-02-01 ENCOUNTER — Ambulatory Visit: Payer: Medicare HMO | Attending: Cardiovascular Disease | Admitting: *Deleted

## 2023-02-01 DIAGNOSIS — Z952 Presence of prosthetic heart valve: Secondary | ICD-10-CM | POA: Diagnosis not present

## 2023-02-01 DIAGNOSIS — Z5181 Encounter for therapeutic drug level monitoring: Secondary | ICD-10-CM

## 2023-02-01 DIAGNOSIS — Z09 Encounter for follow-up examination after completed treatment for conditions other than malignant neoplasm: Secondary | ICD-10-CM

## 2023-02-01 LAB — POCT INR: INR: 1.9 — AB (ref 2.0–3.0)

## 2023-02-01 NOTE — Patient Instructions (Signed)
Description   TAKE 1 TABLETS TONIGHT ONLY THEN continue taking warfarin 1 tablet daily except for 1/2 tablet on Sundays, Tuesdays, and Thursdays. Remain consistent with green leafy veggies. Recheck INR in 3 weeks.  [facility] [phone] or [phone]

## 2023-02-03 ENCOUNTER — Ambulatory Visit (INDEPENDENT_AMBULATORY_CARE_PROVIDER_SITE_OTHER): Payer: Medicare HMO

## 2023-02-03 DIAGNOSIS — I442 Atrioventricular block, complete: Secondary | ICD-10-CM | POA: Diagnosis not present

## 2023-02-03 LAB — CUP PACEART REMOTE DEVICE CHECK
Battery Remaining Longevity: 47 mo
Battery Remaining Percentage: 38 %
Battery Voltage: 2.96 V
Brady Statistic AP VP Percent: 30 %
Brady Statistic AP VS Percent: 1 %
Brady Statistic AS VP Percent: 70 %
Brady Statistic AS VS Percent: 1 %
Brady Statistic RA Percent Paced: 30 %
Brady Statistic RV Percent Paced: 99 %
Date Time Interrogation Session: 20240725020015
Implantable Lead Connection Status: 753985
Implantable Lead Connection Status: 753985
Implantable Lead Implant Date: 20050816
Implantable Lead Implant Date: 20050816
Implantable Lead Location: 753859
Implantable Lead Location: 753860
Implantable Pulse Generator Implant Date: 20171026
Lead Channel Impedance Value: 490 Ohm
Lead Channel Impedance Value: 690 Ohm
Lead Channel Pacing Threshold Amplitude: 0.5 V
Lead Channel Pacing Threshold Amplitude: 0.875 V
Lead Channel Pacing Threshold Pulse Width: 0.5 ms
Lead Channel Pacing Threshold Pulse Width: 0.5 ms
Lead Channel Sensing Intrinsic Amplitude: 12 mV
Lead Channel Sensing Intrinsic Amplitude: 3.3 mV
Lead Channel Setting Pacing Amplitude: 0.75 V
Lead Channel Setting Pacing Amplitude: 1.875
Lead Channel Setting Pacing Pulse Width: 0.5 ms
Lead Channel Setting Sensing Sensitivity: 4 mV
Pulse Gen Model: 2272
Pulse Gen Serial Number: 3182792

## 2023-02-10 DIAGNOSIS — Z85828 Personal history of other malignant neoplasm of skin: Secondary | ICD-10-CM | POA: Diagnosis not present

## 2023-02-10 DIAGNOSIS — C4401 Basal cell carcinoma of skin of lip: Secondary | ICD-10-CM | POA: Diagnosis not present

## 2023-02-12 ENCOUNTER — Encounter: Payer: Self-pay | Admitting: Cardiology

## 2023-02-12 ENCOUNTER — Other Ambulatory Visit: Payer: Self-pay | Admitting: Adult Health

## 2023-02-14 MED ORDER — ISOSORBIDE MONONITRATE ER 30 MG PO TB24: 15.0000 mg | ORAL_TABLET | Freq: Every day | ORAL | 3 refills | Status: DC

## 2023-02-14 NOTE — Telephone Encounter (Signed)
Spoke with cath lab and they will get information together and mail to patient.

## 2023-02-14 NOTE — Telephone Encounter (Signed)
Spoke with cath lab.  There are no replacement stent cards.  Information is in cath report available in my chart.  Cath lab can make a handwritten note with stent information if patient would like

## 2023-02-14 NOTE — Progress Notes (Signed)
Remote pacemaker transmission.   

## 2023-02-17 ENCOUNTER — Telehealth: Payer: Self-pay

## 2023-02-17 NOTE — Telephone Encounter (Signed)
Receieved fax stating that a new prescription as needed for the patient's Imdur 30 mg with directions to cut the tablet and this medication is not designed to be split and cutting may alter its release.  Please clarify the directions and strength.

## 2023-02-17 NOTE — Telephone Encounter (Signed)
Pt has been taking 1/2 tablet since this medication was started in the hospital in June.  Has had no issues.  Pharmacy aware to fill for #45 take 1/2 tab daily.

## 2023-02-22 ENCOUNTER — Ambulatory Visit: Payer: Medicare HMO | Attending: Cardiovascular Disease | Admitting: *Deleted

## 2023-02-22 DIAGNOSIS — Z5181 Encounter for therapeutic drug level monitoring: Secondary | ICD-10-CM | POA: Diagnosis not present

## 2023-02-22 DIAGNOSIS — Z952 Presence of prosthetic heart valve: Secondary | ICD-10-CM

## 2023-02-22 DIAGNOSIS — Z09 Encounter for follow-up examination after completed treatment for conditions other than malignant neoplasm: Secondary | ICD-10-CM

## 2023-02-22 LAB — POCT INR: POC INR: 2.3

## 2023-02-22 NOTE — Patient Instructions (Signed)
Description   Continue taking warfarin 1 tablet daily except for 1/2 tablet on Sundays, Tuesdays, and Thursdays. Remain consistent with green leafy veggies. Recheck INR in 4 weeks.  [facility] [phone] or [phone]

## 2023-03-17 ENCOUNTER — Ambulatory Visit (INDEPENDENT_AMBULATORY_CARE_PROVIDER_SITE_OTHER): Payer: Medicare HMO

## 2023-03-17 DIAGNOSIS — Z23 Encounter for immunization: Secondary | ICD-10-CM | POA: Diagnosis not present

## 2023-03-22 ENCOUNTER — Ambulatory Visit: Payer: Medicare HMO | Attending: Cardiology

## 2023-03-22 DIAGNOSIS — Z952 Presence of prosthetic heart valve: Secondary | ICD-10-CM | POA: Diagnosis not present

## 2023-03-22 DIAGNOSIS — Z5181 Encounter for therapeutic drug level monitoring: Secondary | ICD-10-CM

## 2023-03-22 DIAGNOSIS — Z09 Encounter for follow-up examination after completed treatment for conditions other than malignant neoplasm: Secondary | ICD-10-CM

## 2023-03-22 LAB — POCT INR: INR: 2.1 (ref 2.0–3.0)

## 2023-03-22 NOTE — Patient Instructions (Signed)
Continue taking warfarin 1 tablet daily except for 1/2 tablet on Sundays, Tuesdays, and Thursdays. Remain consistent with green leafy veggies. Recheck INR in 6 weeks.  [facility] [phone] or [phone]

## 2023-03-29 ENCOUNTER — Telehealth: Payer: Self-pay | Admitting: *Deleted

## 2023-03-29 DIAGNOSIS — Z8679 Personal history of other diseases of the circulatory system: Secondary | ICD-10-CM

## 2023-03-29 DIAGNOSIS — Z0189 Encounter for other specified special examinations: Secondary | ICD-10-CM

## 2023-03-29 DIAGNOSIS — I7123 Aneurysm of the descending thoracic aorta, without rupture: Secondary | ICD-10-CM

## 2023-03-29 DIAGNOSIS — I77810 Thoracic aortic ectasia: Secondary | ICD-10-CM

## 2023-03-29 NOTE — Telephone Encounter (Signed)
-----   Message from Tobey Bride sent at 03/29/2023  4:27 PM EDT ----- Regarding: Labs Patient is scheduled for a CT angio chest at [facility] on oct 4th.  He will go to the [facility] in [place] next week to get his labs drawn.  Please put in a BMET order for [facility].  Thank you

## 2023-03-29 NOTE — Telephone Encounter (Signed)
Order for BMET placed in the system and released so that [facility] can draw this on the pt next week, for upcoming CT ANGIO Chest.  This is needed per CT protocol.

## 2023-04-04 DIAGNOSIS — Z0189 Encounter for other specified special examinations: Secondary | ICD-10-CM | POA: Diagnosis not present

## 2023-04-04 DIAGNOSIS — Z8679 Personal history of other diseases of the circulatory system: Secondary | ICD-10-CM | POA: Diagnosis not present

## 2023-04-04 DIAGNOSIS — I7123 Aneurysm of the descending thoracic aorta, without rupture: Secondary | ICD-10-CM | POA: Diagnosis not present

## 2023-04-04 DIAGNOSIS — I77810 Thoracic aortic ectasia: Secondary | ICD-10-CM | POA: Diagnosis not present

## 2023-04-04 DIAGNOSIS — Z9889 Other specified postprocedural states: Secondary | ICD-10-CM | POA: Diagnosis not present

## 2023-04-05 LAB — BASIC METABOLIC PANEL
BUN/Creatinine Ratio: 17 (ref 10–24)
BUN: 14 mg/dL (ref 8–27)
CO2: 25 mmol/L (ref 20–29)
Calcium: 8.8 mg/dL (ref 8.6–10.2)
Chloride: 102 mmol/L (ref 96–106)
Creatinine, Ser: 0.84 mg/dL (ref 0.76–1.27)
Glucose: 125 mg/dL — ABNORMAL HIGH (ref 70–99)
Potassium: 3.9 mmol/L (ref 3.5–5.2)
Sodium: 142 mmol/L (ref 134–144)
eGFR: 89 mL/min/{1.73_m2} (ref >59)

## 2023-04-07 DIAGNOSIS — G20A2 Parkinson's disease without dyskinesia, with fluctuations: Secondary | ICD-10-CM | POA: Diagnosis not present

## 2023-04-07 DIAGNOSIS — G629 Polyneuropathy, unspecified: Secondary | ICD-10-CM | POA: Diagnosis not present

## 2023-04-11 DIAGNOSIS — M13842 Other specified arthritis, left hand: Secondary | ICD-10-CM | POA: Diagnosis not present

## 2023-04-11 DIAGNOSIS — M13861 Other specified arthritis, right knee: Secondary | ICD-10-CM | POA: Diagnosis not present

## 2023-04-15 ENCOUNTER — Ambulatory Visit (HOSPITAL_COMMUNITY)
Admission: RE | Admit: 2023-04-15 | Discharge: 2023-04-15 | Disposition: A | Discharge status: Home / Self Care | Payer: Medicare HMO | Source: Ambulatory Visit | Attending: Cardiology | Admitting: Cardiology

## 2023-04-15 DIAGNOSIS — I517 Cardiomegaly: Secondary | ICD-10-CM | POA: Diagnosis not present

## 2023-04-15 DIAGNOSIS — I771 Stricture of artery: Secondary | ICD-10-CM | POA: Diagnosis not present

## 2023-04-15 DIAGNOSIS — I2510 Atherosclerotic heart disease of native coronary artery without angina pectoris: Secondary | ICD-10-CM | POA: Diagnosis not present

## 2023-04-15 DIAGNOSIS — I77810 Thoracic aortic ectasia: Secondary | ICD-10-CM | POA: Diagnosis not present

## 2023-04-15 DIAGNOSIS — I7121 Aneurysm of the ascending aorta, without rupture: Secondary | ICD-10-CM | POA: Diagnosis not present

## 2023-04-15 MED ORDER — IOHEXOL 350 MG/ML SOLN
75.0000 mL | Freq: Once | INTRAVENOUS | Status: AC | PRN
  Administered 2023-04-15: 75 mL via INTRAVENOUS

## 2023-04-18 ENCOUNTER — Other Ambulatory Visit: Payer: Self-pay | Admitting: Cardiology

## 2023-04-18 ENCOUNTER — Other Ambulatory Visit: Payer: Self-pay | Admitting: Adult Health

## 2023-04-28 ENCOUNTER — Other Ambulatory Visit: Payer: Self-pay | Admitting: Cardiology

## 2023-04-28 NOTE — Telephone Encounter (Signed)
Refill for Atorvastatin has been sent to patient pharmacy.

## 2023-04-29 ENCOUNTER — Other Ambulatory Visit: Payer: Self-pay

## 2023-04-29 NOTE — Patient Instructions (Addendum)
Thank you for allowing the Care Management team to participate in your care. It was great speaking with you today!  We will follow up via telephone on May 30, 2023 at 1 pm. Please do not hesitate to contact me if you require assistance prior to our next outreach.   [facility]  [facility] RN Care Manager Direct Dial: [phone] Website: [email or web address]

## 2023-04-29 NOTE — Patient Outreach (Signed)
Care Management   Visit Note  04/29/2023 Name: [name] MRN: [id number] DOB: May 11, 1946  Subjective: [name] is a 77 y.o. year old male who is a primary care patient of [name], NP. The Care Management team was consulted for assistance.      Engaged with patient via telephone today.  Assessment:  Outpatient Encounter Medications as of 04/29/2023  Medication Sig   albuterol (VENTOLIN HFA) 108 (90 Base) MCG/ACT inhaler INHALE 2 PUFFS INTO THE LUNGS EVERY 6 HOURS AS NEEDED FOR WHEEZING OR SHORTNESS OF BREATH.   Ascorbic Acid (VITAMIN C) 1000 MG tablet Take 1,000 mg by mouth daily.   atorvastatin (LIPITOR) 80 MG tablet TAKE 1 TABLET EVERY DAY   calcium carbonate (OS-CAL) 600 MG TABS Take 600 mg by mouth 2 (two) times daily with a meal.   carbidopa-levodopa (SINEMET CR) 50-200 MG tablet Take 1 tablet by mouth at bedtime.   carbidopa-levodopa (SINEMET IR) 25-100 MG tablet Take 1 tablet by mouth 4 (four) times daily.   cetirizine (ZYRTEC) 10 MG tablet Take 10 mg by mouth daily.    cholecalciferol (VITAMIN D) 25 MCG (1000 UNIT) tablet Take 1,000 Units by mouth every evening.   clopidogrel (PLAVIX) 75 MG tablet Take 1 tablet (75 mg total) by mouth daily.   fluticasone (FLONASE) 50 MCG/ACT nasal spray Place 2 sprays into both nostrils daily. (Patient taking differently: Place 2 sprays into both nostrils daily as needed for allergies or rhinitis.)   furosemide (LASIX) 40 MG tablet TAKE 1 TABLET EVERY DAY   isosorbide mononitrate (IMDUR) 30 MG 24 hr tablet Take 0.5 tablets (15 mg total) by mouth daily.   melatonin 5 MG TABS Take 5 mg by mouth at bedtime.   metFORMIN (GLUCOPHAGE) 500 MG tablet TAKE 1/2 TABLET EVERY DAY WITH BREAKFAST (SCHEDULE APPT FOR FUTURE REFILLS) (Patient taking differently: Take 250 mg by mouth daily with breakfast. TAKE 1/2 TABLET EVERY DAY WITH BREAKFAST (SCHEDULE APPT FOR FUTURE REFILLS))   metoprolol succinate (TOPROL-XL) 50 MG 24 hr tablet Take 1  tablet (50 mg total) by mouth daily. Take with or immediately following a meal.   Misc Natural Products (TART CHERRY ADVANCED) CAPS Take 2 capsules by mouth daily.    Multiple Vitamin (MULTIVITAMIN) tablet Take 1 tablet by mouth daily.     potassium chloride (KLOR-CON) 10 MEQ tablet TAKE 1 TABLET EVERY DAY   Probiotic Product (PHILLIPS COLON HEALTH PO) Take 1 capsule by mouth daily.   vitamin B-12 (CYANOCOBALAMIN) 1000 MCG tablet Take 1,000 mcg by mouth daily.   warfarin (COUMADIN) 10 MG tablet TAKE 1/2 TO 1 TABLET DAILY OR AS DIRECTED BY COUMADIN CLINIC   ACCU-CHEK GUIDE test strip USE AS INSTRUCTED   Accu-Chek Softclix Lancets lancets USE TO CHECK BLOOD SUGAR TWICE A DAY AND AS NEEDED   acetaminophen (TYLENOL) 650 MG CR tablet Take 1,300 mg by mouth 2 (two) times daily.   aspirin 81 MG chewable tablet Chew 1 tablet (81 mg total) by mouth daily. (Patient not taking: Reported on 04/29/2023)   nitroGLYCERIN (NITROSTAT) 0.4 MG SL tablet PLACE 1 TABLET (0.4 MG TOTAL) UNDER THE TONGUE EVERY 5 (FIVE) MINUTES AS NEEDED. FOR CHEST PAIN (Patient taking differently: Place 0.4 mg under the tongue every 5 (five) minutes as needed for chest pain. For chest pain)   No facility-administered encounter medications on file as of 04/29/2023.       Interventions:    Goals Addressed  This Visit's Progress    Care Management       Current Barriers:  Care Management support and education needs related to CAD, HTN, CHF, HLD, Type 2 DM and Parkinsons.  Planned Interventions: CAD, HTN, HLD and CHF. Reviewed medications with spouse/caregiver.  Reports patient is taking all medications as prescribed. She continues to assist with medication management. Reports adjusting coumadin doses as instructed. Denies current concerns regarding prescription cost. Reviewed role and benefits of statin for ASCVD risk reduction. Reports tolerating statin well. Reviewed established blood pressure parameters. Reports  monitoring consistently. Systolic readings have ranged from the 110s to 130s. Diastolic readings have ranged in the 70s to low 80s. Reading today was 118/70. Advised to continue monitoring and maintain a log. Reviewed indications for notifying a provider. Reviewed weights. Reports monitoring consistently. Weight today was 178 lbs. Reminded of need to notify the Cardiology team for weight gain greater than 3 lbs overnight or 5 lbs within a week. Reviewed symptoms. Denies chest pain, palpitations, headaches, lightheadedness, or dizziness. Denies sudden visual changes.  Reports occasional, minimal swelling in lower extremities. Denies abdominal swelling or tightness. Reports some fatigue and dyspnea with activity but overall doing well. Encouraged to continue monitoring symptoms daily and contact clinic with changes. Discussed nutritional intake. Advised to closely monitor sodium consumption and avoid highly processed foods when possible. Reviewed compliance with recommended cardiac prudent diet. He continues doing very well with nutritional intake. Reports reading nutrition labels and limiting sodium, cholesterol, and saturated fats.  Reviewed safety and fall prevention measures. Advised to continue using assistive device with all ambulation to decrease risks of falls. Reviewed pending appointments: Will continue regular outreach with the Coumadin clinic. Pending follow up with Dr. Anne Fu on 07/26/23. Contacted Primary Care clinic today to schedule follow up with PCP on 06/23/23. Reviewed s/sx of heart attack, stroke and CHF exacerbation. Reviewed worsening symptoms that require immediate medical attention.   BP Readings from Last 3 Encounters:  01/20/23 120/68  01/19/23 (!) 100/58  01/06/23 139/83      Lab Results  Component Value Date   CHOL 132 06/10/2022   HDL 46 06/10/2022   LDLCALC 63 06/10/2022   TRIG 128 06/10/2022   CHOLHDL 2.9 06/10/2022    Wt Readings from Last 3 Encounters:   01/20/23 184 lb (83.5 kg)  01/19/23 184 lb 9.6 oz (83.7 kg)  01/04/23 184 lb 14.4 oz (83.9 kg)     Patient Goals/Self-Care  Take medications as prescribed   Attend all scheduled provider appointments Call pharmacy for medication refills 3-7 days in advance of running out of medications Continue monitoring BP and maintain a log Notify provider for BP readings outside of established range Continue weighing daily. Contact provider for weight gain greater than 2 lbs overnight or 5 lbs in a week Report new symptoms to your doctor Continue engaging in low impact activity as tolerated and use assistive device.  ------------------------------------------------------------------------------------------------------------ Planned Interventions: Diabetes Reviewed plan for diabetes management. Reviewed medications. Reports taking metformin as prescribed. A1C is currently within goal. Reviewed blood glucose readings. Reports monitoring fasting levels consistently and keeping a log. Reports fasting readings have ranged from the 90s to 110s. Reading today was 115 mg/dl. He currently receives injections for knee pain at [facility]. Last injection was administered on 04/11/23. Reviewed side effects of steroid injections and temporary increase in blood glucose levels.  Reviewed s/sx of hypoglycemia and hyperglycemia along with recommended interventions. Denies hypoglycemic or hyperglycemic episodes. Reports compliance with recommended diabetic diet. Reports  consuming fruits, vegetables, and leans proteins. Reports occasional consuming sweets but closely monitoring intake of carbs and added sugars.  Reviewed DM preventive care. Eye exam and foot exam are up to date. Spouse continues to assist with daily foot care.  Advised to complete labs as prescribed.  Contacted clinic scheduler to schedule follow up with PCP on 06/23/23. Reviewed instructions r/t pending labs. Aware of need to fast 8 hours prior to  appointment.  Lab Results  Component Value Date   HGBA1C 6.1 01/20/2023     Patient Goals/Self Care: Take medications as prescribed   Attend scheduled medical appointments Call pharmacy for medication refills 3-7 days in advance of running out of medications Call provider office for new concerns or questions  Continue monitoring blood sugars daily and maintain a log. Wash and dry feet carefully every day Check feet daily for cuts, sores or redness Trim toenails straight across Continue wearing comfortable, well-fitting shoes Continue adhering to recommended ADA diet.           PLAN Will follow up next month.   [facility]  [facility], [facility] RN Care Manager Direct Dial: [phone] Website: [email or web address]

## 2023-05-03 ENCOUNTER — Ambulatory Visit: Payer: Medicare HMO | Attending: Cardiology

## 2023-05-03 DIAGNOSIS — Z5181 Encounter for therapeutic drug level monitoring: Secondary | ICD-10-CM

## 2023-05-03 DIAGNOSIS — Z952 Presence of prosthetic heart valve: Secondary | ICD-10-CM | POA: Diagnosis not present

## 2023-05-03 DIAGNOSIS — Z09 Encounter for follow-up examination after completed treatment for conditions other than malignant neoplasm: Secondary | ICD-10-CM

## 2023-05-03 LAB — POCT INR: INR: 1.9 — AB (ref 2.0–3.0)

## 2023-05-03 NOTE — Patient Instructions (Signed)
TAKE 1 TABLET TODAY ONLY THEN Continue taking warfarin 1 tablet daily except for 1/2 tablet on Sundays, Tuesdays, and Thursdays. Remain consistent with green leafy veggies. Recheck INR in 6 weeks.  Coumadin Clinic [phone] or [phone]

## 2023-05-05 ENCOUNTER — Ambulatory Visit (INDEPENDENT_AMBULATORY_CARE_PROVIDER_SITE_OTHER): Payer: Medicare HMO

## 2023-05-05 DIAGNOSIS — I442 Atrioventricular block, complete: Secondary | ICD-10-CM | POA: Diagnosis not present

## 2023-05-05 LAB — CUP PACEART REMOTE DEVICE CHECK
Battery Remaining Longevity: 44 mo
Battery Remaining Percentage: 35 %
Battery Voltage: 2.95 V
Brady Statistic AP VP Percent: 30 %
Brady Statistic AP VS Percent: 1 %
Brady Statistic AS VP Percent: 69 %
Brady Statistic AS VS Percent: 1 %
Brady Statistic RA Percent Paced: 30 %
Brady Statistic RV Percent Paced: 99 %
Date Time Interrogation Session: 20241024020013
Implantable Lead Connection Status: 753985
Implantable Lead Connection Status: 753985
Implantable Lead Implant Date: 20050816
Implantable Lead Implant Date: 20050816
Implantable Lead Location: 753859
Implantable Lead Location: 753860
Implantable Pulse Generator Implant Date: 20171026
Lead Channel Impedance Value: 480 Ohm
Lead Channel Impedance Value: 680 Ohm
Lead Channel Pacing Threshold Amplitude: 0.5 V
Lead Channel Pacing Threshold Amplitude: 1 V
Lead Channel Pacing Threshold Pulse Width: 0.5 ms
Lead Channel Pacing Threshold Pulse Width: 0.5 ms
Lead Channel Sensing Intrinsic Amplitude: 12 mV
Lead Channel Sensing Intrinsic Amplitude: 2.5 mV
Lead Channel Setting Pacing Amplitude: 0.75 V
Lead Channel Setting Pacing Amplitude: 2 V
Lead Channel Setting Pacing Pulse Width: 0.5 ms
Lead Channel Setting Sensing Sensitivity: 4 mV
Pulse Gen Model: 2272
Pulse Gen Serial Number: 3182792

## 2023-05-06 ENCOUNTER — Encounter: Payer: Self-pay | Admitting: Cardiology

## 2023-05-06 MED ORDER — ASPIRIN 81 MG PO CHEW: 81.0000 mg | CHEWABLE_TABLET | Freq: Every day | ORAL | Status: AC

## 2023-05-06 NOTE — Telephone Encounter (Signed)
Spoke with wife and advised of Dr Anne Fu orders.  She states understanding.  Medication list updated.  She will call back if no improvement or symptoms worsen.

## 2023-05-06 NOTE — Telephone Encounter (Signed)
Spoke with pt's wife, DPR who reports pt has had a problem over the last 2 weeks with increased bleeding time.  She reports it is not unusual for pt to have small skin tears on his arm from bumping into something but recently he is bleeding through the band-aids and she is having to use additional gauze under the band-aid for pressure.  Pt currently has 3 wounds total on both arms.  No other bleeding noted at this time.  She is concerned his Coumadin and or Plavix needs to be adjusted.  Pt was seen in Coumadin clinic 05/03/2023 with INR of 1.9. [name] wife advised will forward to Dr Anne Fu for review and recommendation.  Pt verbalizes understanding and thanked Charity fundraiser for the call.

## 2023-05-24 NOTE — Progress Notes (Signed)
Remote pacemaker transmission.   

## 2023-05-30 ENCOUNTER — Other Ambulatory Visit: Payer: Self-pay

## 2023-05-30 NOTE — Patient Instructions (Signed)
Thank you for allowing the Care Management team to participate in your care.  It was great speaking with you today!

## 2023-05-30 NOTE — Patient Outreach (Unsigned)
  Care Management   Visit Note  05/30/2023 Name: [name] MRN: [id number] DOB: 03-13-1946  Subjective: [name] is a 77 y.o. year old male who is a primary care patient of [name], NP. The Care Management team was consulted for assistance.     Engaged with patient and spouse via telephone.  Assessment:  Outpatient Encounter Medications as of 05/30/2023  Medication Sig   ACCU-CHEK GUIDE test strip USE AS INSTRUCTED   Accu-Chek Softclix Lancets lancets USE TO CHECK BLOOD SUGAR TWICE A DAY AND AS NEEDED   acetaminophen (TYLENOL) 650 MG CR tablet Take 1,300 mg by mouth 2 (two) times daily.   albuterol (VENTOLIN HFA) 108 (90 Base) MCG/ACT inhaler INHALE 2 PUFFS INTO THE LUNGS EVERY 6 HOURS AS NEEDED FOR WHEEZING OR SHORTNESS OF BREATH.   Ascorbic Acid (VITAMIN C) 1000 MG tablet Take 1,000 mg by mouth daily.   aspirin 81 MG chewable tablet Chew 1 tablet (81 mg total) by mouth daily.   atorvastatin (LIPITOR) 80 MG tablet TAKE 1 TABLET EVERY DAY   calcium carbonate (OS-CAL) 600 MG TABS Take 600 mg by mouth 2 (two) times daily with a meal.   carbidopa-levodopa (SINEMET CR) 50-200 MG tablet Take 1 tablet by mouth at bedtime.   carbidopa-levodopa (SINEMET IR) 25-100 MG tablet Take 1 tablet by mouth 4 (four) times daily.   cetirizine (ZYRTEC) 10 MG tablet Take 10 mg by mouth daily.    cholecalciferol (VITAMIN D) 25 MCG (1000 UNIT) tablet Take 1,000 Units by mouth every evening.   fluticasone (FLONASE) 50 MCG/ACT nasal spray Place 2 sprays into both nostrils daily. (Patient taking differently: Place 2 sprays into both nostrils daily as needed for allergies or rhinitis.)   furosemide (LASIX) 40 MG tablet TAKE 1 TABLET EVERY DAY   isosorbide mononitrate (IMDUR) 30 MG 24 hr tablet Take 0.5 tablets (15 mg total) by mouth daily.   melatonin 5 MG TABS Take 5 mg by mouth at bedtime.   metFORMIN (GLUCOPHAGE) 500 MG tablet TAKE 1/2 TABLET EVERY DAY WITH BREAKFAST (SCHEDULE APPT FOR  FUTURE REFILLS) (Patient taking differently: Take 250 mg by mouth daily with breakfast. TAKE 1/2 TABLET EVERY DAY WITH BREAKFAST (SCHEDULE APPT FOR FUTURE REFILLS))   metoprolol succinate (TOPROL-XL) 50 MG 24 hr tablet Take 1 tablet (50 mg total) by mouth daily. Take with or immediately following a meal.   Misc Natural Products (TART CHERRY ADVANCED) CAPS Take 2 capsules by mouth daily.    Multiple Vitamin (MULTIVITAMIN) tablet Take 1 tablet by mouth daily.     nitroGLYCERIN (NITROSTAT) 0.4 MG SL tablet PLACE 1 TABLET (0.4 MG TOTAL) UNDER THE TONGUE EVERY 5 (FIVE) MINUTES AS NEEDED. FOR CHEST PAIN (Patient taking differently: Place 0.4 mg under the tongue every 5 (five) minutes as needed for chest pain. For chest pain)   potassium chloride (KLOR-CON) 10 MEQ tablet TAKE 1 TABLET EVERY DAY   Probiotic Product (PHILLIPS COLON HEALTH PO) Take 1 capsule by mouth daily.   vitamin B-12 (CYANOCOBALAMIN) 1000 MCG tablet Take 1,000 mcg by mouth daily.   warfarin (COUMADIN) 10 MG tablet TAKE 1/2 TO 1 TABLET DAILY OR AS DIRECTED BY COUMADIN CLINIC   No facility-administered encounter medications on file as of 05/30/2023.    Interventions:

## 2023-06-14 ENCOUNTER — Ambulatory Visit: Payer: Medicare HMO | Attending: Cardiology

## 2023-06-14 DIAGNOSIS — Z952 Presence of prosthetic heart valve: Secondary | ICD-10-CM

## 2023-06-14 DIAGNOSIS — Z5181 Encounter for therapeutic drug level monitoring: Secondary | ICD-10-CM | POA: Diagnosis not present

## 2023-06-14 DIAGNOSIS — Z09 Encounter for follow-up examination after completed treatment for conditions other than malignant neoplasm: Secondary | ICD-10-CM

## 2023-06-14 LAB — POCT INR: INR: 1.6 — AB (ref 2.0–3.0)

## 2023-06-14 NOTE — Patient Instructions (Signed)
TAKE 1.5 TABLETS TODAY ONLY THEN INCREASE TO 1 tablet daily except for 1/2 tablet on  Tuesdays, and Thursdays. Remain consistent with green leafy veggies. Recheck INR in 4 weeks.  Coumadin Clinic [phone] or [phone]

## 2023-06-22 ENCOUNTER — Ambulatory Visit: Payer: Medicare HMO

## 2023-06-22 VITALS — Ht 66.5 in | Wt 180.0 lb

## 2023-06-22 DIAGNOSIS — Z0000 Encounter for general adult medical examination without abnormal findings: Secondary | ICD-10-CM

## 2023-06-22 NOTE — Progress Notes (Signed)
Subjective:   [name] is a 77 y.o. male who presents for Medicare Annual/Subsequent preventive examination.  Visit Complete: Virtual I connected with  [name] on 06/22/23 by a audio enabled telemedicine application and verified that I am speaking with the correct person using two identifiers.  Patient Location: Home  Provider Location: Home Office  I discussed the limitations of evaluation and management by telemedicine. The patient expressed understanding and agreed to proceed.  Vital Signs: Because this visit was a virtual/telehealth visit, some criteria may be missing or patient reported. Any vitals not documented were not able to be obtained and vitals that have been documented are patient reported.  Patient Medicare AWV questionnaire was completed by the patient on 06/22/23; I have confirmed that all information answered by patient is correct and no changes since this date.  Cardiac Risk Factors include: advanced age (>45men, >60 women);diabetes mellitus;hypertension     Objective:    Today's Vitals   06/22/23 1506  Weight: 180 lb (81.6 kg)  Height: 5' 6.5" (1.689 m)   Body mass index is 28.62 kg/m.     06/22/2023    3:13 PM 12/31/2022    9:39 PM 06/17/2022    1:22 PM 05/25/2022   11:55 AM 06/16/2021    1:15 PM 06/11/2020   11:32 AM 03/15/2018    1:55 PM  Advanced Directives  Does Patient Have a Medical Advance Directive? No No No No No No No  Would patient like information on creating a medical advance directive? No - Patient declined No - Patient declined No - Patient declined No - Patient declined No - Patient declined Yes (MAU/Ambulatory/Procedural Areas - Information given) No - Patient declined    Current Medications (verified) Outpatient Encounter Medications as of 06/22/2023  Medication Sig   ACCU-CHEK GUIDE test strip USE AS INSTRUCTED   Accu-Chek Softclix Lancets lancets USE TO CHECK BLOOD SUGAR TWICE A DAY AND AS NEEDED   acetaminophen  (TYLENOL) 650 MG CR tablet Take 1,300 mg by mouth 2 (two) times daily.   albuterol (VENTOLIN HFA) 108 (90 Base) MCG/ACT inhaler INHALE 2 PUFFS INTO THE LUNGS EVERY 6 HOURS AS NEEDED FOR WHEEZING OR SHORTNESS OF BREATH.   Ascorbic Acid (VITAMIN C) 1000 MG tablet Take 1,000 mg by mouth daily.   aspirin 81 MG chewable tablet Chew 1 tablet (81 mg total) by mouth daily.   atorvastatin (LIPITOR) 80 MG tablet TAKE 1 TABLET EVERY DAY   calcium carbonate (OS-CAL) 600 MG TABS Take 600 mg by mouth 2 (two) times daily with a meal.   carbidopa-levodopa (SINEMET CR) 50-200 MG tablet Take 1 tablet by mouth at bedtime.   carbidopa-levodopa (SINEMET IR) 25-100 MG tablet Take 1 tablet by mouth 4 (four) times daily.   cetirizine (ZYRTEC) 10 MG tablet Take 10 mg by mouth daily.    cholecalciferol (VITAMIN D) 25 MCG (1000 UNIT) tablet Take 1,000 Units by mouth every evening.   fluticasone (FLONASE) 50 MCG/ACT nasal spray Place 2 sprays into both nostrils daily. (Patient taking differently: Place 2 sprays into both nostrils daily as needed for allergies or rhinitis.)   furosemide (LASIX) 40 MG tablet TAKE 1 TABLET EVERY DAY   isosorbide mononitrate (IMDUR) 30 MG 24 hr tablet Take 0.5 tablets (15 mg total) by mouth daily.   melatonin 5 MG TABS Take 5 mg by mouth at bedtime.   metFORMIN (GLUCOPHAGE) 500 MG tablet TAKE 1/2 TABLET EVERY DAY WITH BREAKFAST (SCHEDULE APPT FOR FUTURE REFILLS) (Patient  taking differently: Take 250 mg by mouth daily with breakfast. TAKE 1/2 TABLET EVERY DAY WITH BREAKFAST (SCHEDULE APPT FOR FUTURE REFILLS))   metoprolol succinate (TOPROL-XL) 50 MG 24 hr tablet Take 1 tablet (50 mg total) by mouth daily. Take with or immediately following a meal.   Misc Natural Products (TART CHERRY ADVANCED) CAPS Take 2 capsules by mouth daily.    Multiple Vitamin (MULTIVITAMIN) tablet Take 1 tablet by mouth daily.     nitroGLYCERIN (NITROSTAT) 0.4 MG SL tablet PLACE 1 TABLET (0.4 MG TOTAL) UNDER THE TONGUE  EVERY 5 (FIVE) MINUTES AS NEEDED. FOR CHEST PAIN (Patient taking differently: Place 0.4 mg under the tongue every 5 (five) minutes as needed for chest pain. For chest pain)   potassium chloride (KLOR-CON) 10 MEQ tablet TAKE 1 TABLET EVERY DAY   Probiotic Product (PHILLIPS COLON HEALTH PO) Take 1 capsule by mouth daily.   vitamin B-12 (CYANOCOBALAMIN) 1000 MCG tablet Take 1,000 mcg by mouth daily.   warfarin (COUMADIN) 10 MG tablet TAKE 1/2 TO 1 TABLET DAILY OR AS DIRECTED BY COUMADIN CLINIC   No facility-administered encounter medications on file as of 06/22/2023.    Allergies (verified) Bee venom, Codeine phosphate, Metoprolol tartrate, Pramipexole, Tramadol, and Ramipril   History: Past Medical History:  Diagnosis Date   Acute renal failure (HCC) 02/09/2013   Aortic stenosis 08/25/2010   a. [place] aortic root replacement with a St. Jude mechanical valve and Hemashield conduit 02/2004.   Arthritis    Asthma    AV BLOCK, COMPLETE    a. s/p St Jude dual chamber pacemaker 02/2004.   CAD (coronary artery disease)    a. s/p CABGx2 (LIMA-dLAD, SVG-Cx). b. Low risk nuc 12/2012 without ischemia, EF 46% mild apical hypokinesia (EF 55% inf HK by echo).   Carotid artery disease (HCC)    a. 0-39% bilateral ICA stenosis, stable mild hard plaque in carotid bulbs. F/u 03/2014 recommended.   Diabetes mellitus without complication (HCC)    type 2    DIVERTICULOSIS, COLON 10/17/2007   Ejection fraction    a. EF 55% with inf HK, mild MR by echo 12/2012.   GERD (gastroesophageal reflux disease)    hxof    GOUT 01/16/2007   Headache    hx of migraines    HEMORRHOIDS, INTERNAL 11/01/2008   Hx of adenomatous polyp of colon 02/2019   no recall needed   HYPERLIPIDEMIA 01/16/2007   HYPERTENSION 01/16/2007   Impaired glucose tolerance    LEG EDEMA 11/27/2008   Special screening for malignant neoplasm of prostate    Warfarin anticoagulation    AVR   Past Surgical History:  Procedure Laterality Date   AORTIC  VALVE REPLACEMENT     CARDIAC CATHETERIZATION  06/2003   CIRCUMCISION     COLONOSCOPY     CORONARY ARTERY BYPASS GRAFT     CORONARY ATHERECTOMY N/A 05/28/2022   Procedure: CORONARY ATHERECTOMY;  Surgeon: Swaziland, Peter M, MD;  Location: [facility] INVASIVE CV LAB;  Service: Cardiovascular;  Laterality: N/A;   CORONARY LITHOTRIPSY N/A 05/28/2022   Procedure: CORONARY LITHOTRIPSY;  Surgeon: Swaziland, Peter M, MD;  Location: [facility] INVASIVE CV LAB;  Service: Cardiovascular;  Laterality: N/A;   CORONARY STENT INTERVENTION N/A 05/28/2022   Procedure: CORONARY STENT INTERVENTION;  Surgeon: Swaziland, Peter M, MD;  Location: [facility] INVASIVE CV LAB;  Service: Cardiovascular;  Laterality: N/A;   CORONARY STENT INTERVENTION N/A 01/03/2023   Procedure: CORONARY STENT INTERVENTION;  Surgeon: Swaziland, Peter M, MD;  Location: [facility] INVASIVE  CV LAB;  Service: Cardiovascular;  Laterality: N/A;   coronary stents      prior to bypass    EP IMPLANTABLE DEVICE N/A 05/06/2016   Procedure: PPM Generator Changeout;  Surgeon: Marinus Maw, MD;  Location: [facility] INVASIVE CV LAB;  Service: Cardiovascular;  Laterality: N/A;   ESOPHAGOGASTRODUODENOSCOPY     HERNIA REPAIR     INGUINAL HERNIA REPAIR Bilateral 02/07/2013   Procedure: LAPAROSCOPIC BILATERAL INGUINAL HERNIA REPAIR;  Surgeon: Wilmon Arms. Corliss Skains, MD;  Location: [facility] ORS;  Service: General;  Laterality: Bilateral;   INSERTION OF MESH Bilateral 02/07/2013   Procedure: INSERTION OF MESH;  Surgeon: Wilmon Arms. Corliss Skains, MD;  Location: [facility] ORS;  Service: General;  Laterality: Bilateral;   LEFT HEART CATH AND CORS/GRAFTS ANGIOGRAPHY N/A 05/27/2022   Procedure: LEFT HEART CATH AND CORS/GRAFTS ANGIOGRAPHY;  Surgeon: Corky Crafts, MD;  Location: [facility] INVASIVE CV LAB;  Service: Cardiovascular;  Laterality: N/A;   LEFT HEART CATH AND CORS/GRAFTS ANGIOGRAPHY N/A 01/03/2023   Procedure: LEFT HEART CATH AND CORS/GRAFTS ANGIOGRAPHY;  Surgeon: Swaziland, Peter M, MD;  Location: [facility] INVASIVE CV LAB;  Service:  Cardiovascular;  Laterality: N/A;   LUMBAR LAMINECTOMY/DECOMPRESSION MICRODISCECTOMY N/A 03/15/2018   Procedure: Complete decompressive lumbar laminectomy for spinal stenosis of L4-L5 and foraminotomy for L4-L5 root on right;  Surgeon: Ranee Gosselin, MD;  Location: [facility] ORS;  Service: Orthopedics;  Laterality: N/A;   PACEMAKER INSERTION     SHOULDER SURGERY Bilateral    SKIN BIOPSY  05/04/2019   BASAL CELL CARCINOMA//MID CENTRAL NASAL   SKIN BIOPSY  10/11/2019   Superficial basal cell carcinoma - left superior central forehead shave   Family History  Problem Relation Age of Onset   Heart attack Father    Kidney cancer Father    Hypertension Mother    Diabetes Mother    Diabetes Brother    Epilepsy Sister    Diabetes Sister    Diabetes Brother    Colon cancer Neg Hx    Rectal cancer Neg Hx    Stomach cancer Neg Hx    Esophageal cancer Neg Hx    Social History   Socioeconomic History   Marital status: Married    Spouse name: Not on file   Number of children: 3   Years of education: Not on file   Highest education level: 12th grade  Occupational History   Occupation: retired  Tobacco Use   Smoking status: Former    Current packs/day: 0.00    Types: Cigarettes    Quit date: 07/12/1973    Years since quitting: 49.9   Smokeless tobacco: Never  Vaping Use   Vaping status: Never Used  Substance and Sexual Activity   Alcohol use: No   Drug use: No   Sexual activity: Not on file  Other Topics Concern   Not on file  Social History Narrative   The patient is married and retired   He reports 3 grown children   Former smoker, no alcohol or tobacco or drug use at this time   Social Determinants of Corporate investment banker Strain: Low Risk  (06/22/2023)   Overall Financial Resource Strain (CARDIA)    Difficulty of Paying Living Expenses: Not hard at all  Food Insecurity: No Food Insecurity (06/22/2023)   Hunger Vital Sign    Worried About Running Out of Food in the Last  Year: Never true    Ran Out of Food in the Last Year: Never true  Transportation Needs: No  Transportation Needs (06/22/2023)   PRAPARE - Administrator, Civil Service (Medical): No    Lack of Transportation (Non-Medical): No  Physical Activity: Inactive (06/22/2023)   Exercise Vital Sign    Days of Exercise per Week: 0 days    Minutes of Exercise per Session: 0 min  Stress: No Stress Concern Present (06/22/2023)   [employer] of Occupational Health - Occupational Stress Questionnaire    Feeling of Stress : Not at all  Social Connections: Moderately Isolated (06/22/2023)   Social Connection and Isolation Panel [NHANES]    Frequency of Communication with Friends and Family: More than three times a week    Frequency of Social Gatherings with Friends and Family: Once a week    Attends Religious Services: Never    Database administrator or Organizations: No    Attends Engineer, structural: Never    Marital Status: Married    Tobacco Counseling Counseling given: Not Answered   Clinical Intake:  Pre-visit preparation completed: Yes  Pain : No/denies pain     BMI - recorded: 28.62 Nutritional Status: BMI 25 -29 Overweight Nutritional Risks: None Diabetes: Yes CBG done?: Yes (CBG 111 Per patient) CBG resulted in Enter/ Edit results?: Yes Did pt. bring in CBG monitor from home?: No  How often do you need to have someone help you when you read instructions, pamphlets, or other written materials from your doctor or pharmacy?: 1 - Never  Interpreter Needed?: No  Information entered by :: Theresa Mulligan LPN   Activities of Daily Living    06/22/2023    8:45 AM 01/01/2023    1:00 AM  In your present state of health, do you have any difficulty performing the following activities:  Hearing? 0 1  Vision? 0 0  Difficulty concentrating or making decisions? 0 0  Walking or climbing stairs? 1 1  Comment Uses a Cane   Dressing or bathing? 0 1  Doing  errands, shopping? 0 1  Preparing Food and eating ? N   Using the Toilet? N   In the past six months, have you accidently leaked urine? N   Do you have problems with loss of bowel control? N   Managing your Medications? N   Managing your Finances? N   Housekeeping or managing your Housekeeping? N     Patient Care Team: [name], NP as PCP - General (Family Medicine) Jake Bathe, MD as PCP - Cardiology (Cardiology) Marinus Maw, MD as PCP - Electrophysiology (Cardiology) Ranee Gosselin, MD as Consulting Physician (Orthopedic Surgery) Aris Lot, MD as Consulting Physician (Dermatology) Juanell Fairly, RN as Case Manager Rema Jasmine, MD as Referring Physician (Neurology)  Indicate any recent Medical Services you may have received from other than [facility] providers in the past year (date may be approximate).     Assessment:   This is a routine wellness examination for Jamonta.  Hearing/Vision screen Hearing Screening - Comments:: Denies hearing difficulties   Vision Screening - Comments:: Wears rx glasses - up to date with routine eye exams with  [facility]   Goals Addressed               This Visit's Progress     Stay active (pt-stated)         Depression Screen    06/22/2023    3:17 PM 05/30/2023    1:32 PM 06/17/2022    1:19 PM 06/11/2022    2:20 PM 06/16/2021  1:02 PM 06/11/2020   11:38 AM 01/09/2020    9:43 AM  PHQ 2/9 Scores  PHQ - 2 Score 0 0 0 0 0 0 0  PHQ- 9 Score      0     Fall Risk    06/22/2023    3:12 PM 06/22/2023    8:45 AM 04/29/2023    4:24 PM 06/17/2022    1:21 PM 06/11/2022    2:20 PM  Fall Risk   Falls in the past year? 1 1 1 1 1   Number falls in past yr: 0 0 1 0 1  Injury with Fall? 0 1 0 0 0  Comment    No Injury or medical attention needed   Risk for fall due to : No Fall Risks  History of fall(s);Impaired balance/gait;Medication side effect;Other (Comment);Impaired mobility No Fall Risks History of  fall(s);Impaired balance/gait;Medication side effect;Other (Comment)  Risk for fall due to: Comment   Impaired mobility r/t Parkinsons Disease  Dx of Parkinson's Disease  Follow up Falls prevention discussed  Falls prevention discussed Falls prevention discussed Falls prevention discussed    MEDICARE RISK AT HOME: Medicare Risk at Home Any stairs in or around the home?: Yes If so, are there any without handrails?: No Home free of loose throw rugs in walkways, pet beds, electrical cords, etc?: Yes Adequate lighting in your home to reduce risk of falls?: Yes Life alert?: No Use of a cane, walker or w/c?: Yes Grab bars in the bathroom?: Yes Shower chair or bench in shower?: Yes Elevated toilet seat or a handicapped toilet?: No  TIMED UP AND GO:  Was the test performed?  No    Cognitive Function:        06/22/2023    3:13 PM 06/17/2022    1:22 PM 06/16/2021    1:16 PM  6CIT Screen  What Year? 0 points 0 points 0 points  What month? 0 points 0 points 0 points  What time? 0 points 0 points 0 points  Count back from 20 0 points 0 points 0 points  Months in reverse 0 points 0 points 0 points  Repeat phrase 0 points 0 points 2 points  Total Score 0 points 0 points 2 points    Immunizations Immunization History  Administered Date(s) Administered   Fluad Quad(high Dose 65+) 03/22/2019, 04/01/2020, 04/09/2021, 03/19/2022   Fluad Trivalent(High Dose 65+) 03/17/2023   Influenza Split 03/31/2011, 04/10/2012   Influenza Whole 04/04/2008, 05/09/2009, 03/18/2010   Influenza, High Dose Seasonal PF 04/28/2015, 04/06/2016, 04/12/2017, 03/30/2018   Influenza,inj,Quad PF,6+ Mos 04/02/2013   Influenza-Unspecified 04/05/2014   Pneumococcal Conjugate-13 02/13/2014   Pneumococcal Polysaccharide-23 10/07/2011   Tdap 10/07/2011, 07/16/2012   Zoster Recombinant(Shingrix) 10/11/2017, 12/12/2017   Zoster, Live 10/07/2011    TDAP status: Due, Education has been provided regarding the importance  of this vaccine. Advised may receive this vaccine at local pharmacy or Health Dept. Aware to provide a copy of the vaccination record if obtained from local pharmacy or Health Dept. Verbalized acceptance and understanding.  Flu Vaccine status: Up to date  Pneumococcal vaccine status: Up to date  Covid-19 vaccine status: Declined, Education has been provided regarding the importance of this vaccine but patient still declined. Advised may receive this vaccine at local pharmacy or Health Dept.or vaccine clinic. Aware to provide a copy of the vaccination record if obtained from local pharmacy or Health Dept. Verbalized acceptance and understanding.  Qualifies for Shingles Vaccine? Yes   Zostavax completed Yes  Shingrix Completed?: Yes  Screening Tests Health Maintenance  Topic Date Due   DTaP/Tdap/Td (3 - Td or Tdap) 07/16/2022   COVID-19 Vaccine (1 - 2023-24 season) Never done   Diabetic kidney evaluation - Urine ACR  06/18/2023   FOOT EXAM  06/18/2023   HEMOGLOBIN A1C  07/23/2023   OPHTHALMOLOGY EXAM  08/10/2023   Diabetic kidney evaluation - eGFR measurement  04/03/2024   Medicare Annual Wellness (AWV)  06/21/2024   Pneumonia Vaccine 29+ Years old  Completed   INFLUENZA VACCINE  Completed   Hepatitis C Screening  Completed   Zoster Vaccines- Shingrix  Completed   HPV VACCINES  Aged Out   Colonoscopy  Discontinued    Health Maintenance  Health Maintenance Due  Topic Date Due   DTaP/Tdap/Td (3 - Td or Tdap) 07/16/2022   COVID-19 Vaccine (1 - 2023-24 season) Never done   Diabetic kidney evaluation - Urine ACR  06/18/2023   FOOT EXAM  06/18/2023         Additional Screening:  Hepatitis C Screening: does qualify; Completed 10/11/16  Vision Screening: Recommended annual ophthalmology exams for early detection of glaucoma and other disorders of the eye. Is the patient up to date with their annual eye exam?  Yes  Who is the provider or what is the name of the office in which  the patient attends annual eye exams? [facility] If pt is not established with a provider, would they like to be referred to a provider to establish care? No .   Dental Screening: Recommended annual dental exams for proper oral hygiene  Diabetic Foot Exam: Diabetic Foot Exam: Overdue, Pt has been advised about the importance in completing this exam. Pt is scheduled for diabetic foot exam on Deferred.  Community Resource Referral / Chronic Care Management:  CRR required this visit?  No   CCM required this visit?  No     Plan:     I have personally reviewed and noted the following in the patient's chart:   Medical and social history Use of alcohol, tobacco or illicit drugs  Current medications and supplements including opioid prescriptions. Patient is not currently taking opioid prescriptions. Functional ability and status Nutritional status Physical activity Advanced directives List of other physicians Hospitalizations, surgeries, and ER visits in previous 12 months Vitals Screenings to include cognitive, depression, and falls Referrals and appointments  In addition, I have reviewed and discussed with patient certain preventive protocols, quality metrics, and best practice recommendations. A written personalized care plan for preventive services as well as general preventive health recommendations were provided to patient.     Tillie Rung, LPN   [id number]/56/[id number]   After Visit Summary: (MyChart) Due to this being a telephonic visit, the after visit summary with patients personalized plan was offered to patient via MyChart   Nurse Notes: None

## 2023-06-22 NOTE — Patient Instructions (Addendum)
[name] , Thank you for taking time to come for your Medicare Wellness Visit. I appreciate your ongoing commitment to your health goals. Please review the following plan we discussed and let me know if I can assist you in the future.   Referrals/Orders/Follow-Ups/Clinician Recommendations:   This is a list of the screening recommended for you and due dates:  Health Maintenance  Topic Date Due   DTaP/Tdap/Td vaccine (3 - Td or Tdap) 07/16/2022   COVID-19 Vaccine (1 - 2023-24 season) Never done   Yearly kidney health urinalysis for diabetes  06/18/2023   Complete foot exam   06/18/2023   Hemoglobin A1C  07/23/2023   Eye exam for diabetics  08/10/2023   Yearly kidney function blood test for diabetes  04/03/2024   Medicare Annual Wellness Visit  06/21/2024   Pneumonia Vaccine  Completed   Flu Shot  Completed   Hepatitis C Screening  Completed   Zoster (Shingles) Vaccine  Completed   HPV Vaccine  Aged Out   Colon Cancer Screening  Discontinued    Advanced directives: (Declined) Advance directive discussed with you today. Even though you declined this today, please call our office should you change your mind, and we can give you the proper paperwork for you to fill out.  Next Medicare Annual Wellness Visit scheduled for next year: Yes

## 2023-06-23 ENCOUNTER — Ambulatory Visit (INDEPENDENT_AMBULATORY_CARE_PROVIDER_SITE_OTHER): Payer: Medicare HMO | Admitting: Adult Health

## 2023-06-23 VITALS — BP 110/78 | HR 82 | Temp 98.1°F | Ht 66.5 in | Wt 183.0 lb

## 2023-06-23 DIAGNOSIS — Z952 Presence of prosthetic heart valve: Secondary | ICD-10-CM

## 2023-06-23 DIAGNOSIS — G20A1 Parkinson's disease without dyskinesia, without mention of fluctuations: Secondary | ICD-10-CM | POA: Diagnosis not present

## 2023-06-23 DIAGNOSIS — Z0000 Encounter for general adult medical examination without abnormal findings: Secondary | ICD-10-CM

## 2023-06-23 DIAGNOSIS — I214 Non-ST elevation (NSTEMI) myocardial infarction: Secondary | ICD-10-CM | POA: Diagnosis not present

## 2023-06-23 DIAGNOSIS — E785 Hyperlipidemia, unspecified: Secondary | ICD-10-CM

## 2023-06-23 DIAGNOSIS — I10 Essential (primary) hypertension: Secondary | ICD-10-CM | POA: Diagnosis not present

## 2023-06-23 DIAGNOSIS — Z7984 Long term (current) use of oral hypoglycemic drugs: Secondary | ICD-10-CM | POA: Diagnosis not present

## 2023-06-23 DIAGNOSIS — E119 Type 2 diabetes mellitus without complications: Secondary | ICD-10-CM | POA: Diagnosis not present

## 2023-06-23 DIAGNOSIS — I25810 Atherosclerosis of coronary artery bypass graft(s) without angina pectoris: Secondary | ICD-10-CM

## 2023-06-23 DIAGNOSIS — M170 Bilateral primary osteoarthritis of knee: Secondary | ICD-10-CM

## 2023-06-23 LAB — COMPREHENSIVE METABOLIC PANEL
ALT: 9 U/L (ref 0–53)
AST: 27 U/L (ref 0–37)
Albumin: 4.1 g/dL (ref 3.5–5.2)
Alkaline Phosphatase: 45 U/L (ref 39–117)
BUN: 17 mg/dL (ref 6–23)
CO2: 31 meq/L (ref 19–32)
Calcium: 9.1 mg/dL (ref 8.4–10.5)
Chloride: 103 meq/L (ref 96–112)
Creatinine, Ser: 0.79 mg/dL (ref 0.40–1.50)
GFR: 84.8 mL/min (ref >60.00)
Glucose, Bld: 101 mg/dL — ABNORMAL HIGH (ref 70–99)
Potassium: 3.9 meq/L (ref 3.5–5.1)
Sodium: 141 meq/L (ref 135–145)
Total Bilirubin: 0.9 mg/dL (ref 0.2–1.2)
Total Protein: 5.9 g/dL — ABNORMAL LOW (ref 6.0–8.3)

## 2023-06-23 LAB — CBC
HCT: 42.3 % (ref 39.0–52.0)
Hemoglobin: 14 g/dL (ref 13.0–17.0)
MCHC: 33.2 g/dL (ref 30.0–36.0)
MCV: 90.3 fL (ref 78.0–100.0)
Platelets: 192 10*3/uL (ref 150.0–400.0)
RBC: 4.68 Mil/uL (ref 4.22–5.81)
RDW: 16.5 % — ABNORMAL HIGH (ref 11.5–15.5)
WBC: 7.5 10*3/uL (ref 4.0–10.5)

## 2023-06-23 LAB — LIPID PANEL
Cholesterol: 128 mg/dL (ref 0–200)
HDL: 53 mg/dL (ref >39.00)
LDL Cholesterol: 63 mg/dL (ref 0–99)
NonHDL: 75.34
Total CHOL/HDL Ratio: 2
Triglycerides: 63 mg/dL (ref 0.0–149.0)
VLDL: 12.6 mg/dL (ref 0.0–40.0)

## 2023-06-23 LAB — MICROALBUMIN / CREATININE URINE RATIO
Creatinine,U: 218.9 mg/dL
Microalb Creat Ratio: 0.6 mg/g (ref 0.0–30.0)
Microalb, Ur: 1.2 mg/dL (ref 0.0–1.9)

## 2023-06-23 LAB — TSH: TSH: 1.27 u[IU]/mL (ref 0.35–5.50)

## 2023-06-23 LAB — HEMOGLOBIN A1C: Hgb A1c MFr Bld: 6.5 % (ref 4.6–6.5)

## 2023-06-23 MED ORDER — POTASSIUM CHLORIDE ER 10 MEQ PO TBCR: 10.0000 meq | EXTENDED_RELEASE_TABLET | Freq: Every day | ORAL | 3 refills | Status: DC

## 2023-06-23 MED ORDER — FUROSEMIDE 40 MG PO TABS: 40.0000 mg | ORAL_TABLET | Freq: Every day | ORAL | 3 refills | Status: DC

## 2023-06-23 MED ORDER — METFORMIN HCL 500 MG PO TABS: ORAL_TABLET | ORAL | 3 refills | Status: DC

## 2023-06-23 NOTE — Patient Instructions (Signed)
It was great seeing you today  ° °We will follow up with you regarding your lab work  ° °Please let me know if you need anything  ° °I will see you back in 6 months  °

## 2023-06-23 NOTE — Progress Notes (Signed)
Subjective:    Patient ID: [name], male    DOB: August 14, 1945, 77 y.o.   MRN: [id number]  HPI Patient presents for yearly preventative medicine examination. He is pleasant 77 year old male who  has a past medical history of Acute renal failure (HCC) (02/09/2013), Aortic stenosis (08/25/2010), Arthritis, Asthma, AV BLOCK, COMPLETE, CAD (coronary artery disease), Carotid artery disease (HCC), Diabetes mellitus without complication (HCC), DIVERTICULOSIS, COLON (10/17/2007), Ejection fraction, GERD (gastroesophageal reflux disease), GOUT (01/16/2007), Headache, HEMORRHOIDS, INTERNAL (11/01/2008), adenomatous polyp of colon (02/2019), HYPERLIPIDEMIA (01/16/2007), HYPERTENSION (01/16/2007), Impaired glucose tolerance, LEG EDEMA (11/27/2008), Special screening for malignant neoplasm of prostate, and Warfarin anticoagulation.  H/o NSTEMI -on 05/25/2022.  Was brought to the Cath Lab on 05/27/2022 where he was found to have severe native two-vessel CAD including LAD and LCx.  As for VG to OM occluded.  Very complex distal LM, proximal CX, and proximal OM calcified lesion involving several bifurcations.  Plavix 75 mg was added to his regimen with aspirin 81 mg.  He was continued on Coumadin. He has come off Plavix about two months ago due to bleeding and bruising. He was placed back on 81 mg ASA.   He has not had any recent chest pain, shortness of breath, or palpitations.  He feels as though he is doing well and has no complaints.  Diabetes mellitus type 2-managed with metformin 250 mg daily.  Does monitor his blood sugars periodically with readings in the 120s or below.  He denies symptoms of hypoglycemia. Lab Results  Component Value Date   HGBA1C 6.1 01/20/2023   HGBA1C 6.4 (H) 05/25/2022   HGBA1C 6.0 (A) 11/04/2021   Hypertension - managed with losartan 100 mg daily, Lasix 40 mg daily, and Toprol 100 mg daily.  He denies dizziness, lightheadedness, chest pain, or shortness of breath BP Readings from Last 3  Encounters:  06/23/23 110/78  01/20/23 120/68  01/19/23 (!) 100/58   Hyperlipidemia/CAD-managed with Lipitor 80 mg daily.  He denies myalgia or fatigue.  He is on chronic anticoagulation with Plavix and Coumadin.  Lab Results  Component Value Date   CHOL 132 06/10/2022   HDL 46 06/10/2022   LDLCALC 63 06/10/2022   TRIG 128 06/10/2022   CHOLHDL 2.9 06/10/2022   Aortic stenosis with mechanical valve replacement/complete AV block-mechanical valve replacement in 2005.  He is on anticoagulation with Plavix  and Coumadin . He is followed by cardiology on a regular basis.  He does have a pacemaker  Osteoarthritis of bilateral knees-is managed by orthopedics.  He receives steroid injections every 3 to 4 months  Parkinson's disease and essential tremor-noted by neurology outside of the [facility] system.  He is currently prescribed Sinemet IR 25-100 mg.   All immunizations and health maintenance protocols were reviewed with the patient and needed orders were placed.  Appropriate screening laboratory values were ordered for the patient including screening of hyperlipidemia, renal function and hepatic function.  Medication reconciliation,  past medical history, social history, problem list and allergies were reviewed in detail with the patient  Goals were established with regard to weight loss, exercise, and  diet in compliance with medications Wt Readings from Last 3 Encounters:  06/23/23 183 lb (83 kg)  06/22/23 180 lb (81.6 kg)  01/20/23 184 lb (83.5 kg)   Review of Systems  Constitutional: Negative.   HENT: Negative.    Eyes: Negative.   Respiratory: Negative.    Cardiovascular: Negative.   Gastrointestinal: Negative.   Endocrine: Negative.  Genitourinary: Negative.   Musculoskeletal:  Positive for arthralgias and gait problem.  Skin: Negative.   Allergic/Immunologic: Negative.   Neurological:  Positive for tremors.  Hematological: Negative.   Psychiatric/Behavioral: Negative.     All other systems reviewed and are negative.  Past Medical History:  Diagnosis Date   Acute renal failure (HCC) 02/09/2013   Aortic stenosis 08/25/2010   a. Bentall aortic root replacement with a St. Jude mechanical valve and Hemashield conduit 02/2004.   Arthritis    Asthma    AV BLOCK, COMPLETE    a. s/p St Jude dual chamber pacemaker 02/2004.   CAD (coronary artery disease)    a. s/p CABGx2 (LIMA-dLAD, SVG-Cx). b. Low risk nuc 12/2012 without ischemia, EF 46% mild apical hypokinesia (EF 55% inf HK by echo).   Carotid artery disease (HCC)    a. 0-39% bilateral ICA stenosis, stable mild hard plaque in carotid bulbs. F/u 03/2014 recommended.   Diabetes mellitus without complication (HCC)    type 2    DIVERTICULOSIS, COLON 10/17/2007   Ejection fraction    a. EF 55% with inf HK, mild MR by echo 12/2012.   GERD (gastroesophageal reflux disease)    hxof    GOUT 01/16/2007   Headache    hx of migraines    HEMORRHOIDS, INTERNAL 11/01/2008   Hx of adenomatous polyp of colon 02/2019   no recall needed   HYPERLIPIDEMIA 01/16/2007   HYPERTENSION 01/16/2007   Impaired glucose tolerance    LEG EDEMA 11/27/2008   Special screening for malignant neoplasm of prostate    Warfarin anticoagulation    AVR    Social History   Socioeconomic History   Marital status: Married    Spouse name: Not on file   Number of children: 3   Years of education: Not on file   Highest education level: 12th grade  Occupational History   Occupation: retired  Tobacco Use   Smoking status: Former    Current packs/day: 0.00    Types: Cigarettes    Quit date: 07/12/1973    Years since quitting: 49.9   Smokeless tobacco: Never  Vaping Use   Vaping status: Never Used  Substance and Sexual Activity   Alcohol use: No   Drug use: No   Sexual activity: Not on file  Other Topics Concern   Not on file  Social History Narrative   The patient is married and retired   He reports 3 grown children   Former smoker, no alcohol  or tobacco or drug use at this time   Social Drivers of Corporate investment banker Strain: Low Risk  (06/22/2023)   Overall Financial Resource Strain (CARDIA)    Difficulty of Paying Living Expenses: Not hard at all  Food Insecurity: No Food Insecurity (06/22/2023)   Hunger Vital Sign    Worried About Running Out of Food in the Last Year: Never true    Ran Out of Food in the Last Year: Never true  Transportation Needs: No Transportation Needs (06/22/2023)   PRAPARE - Administrator, Civil Service (Medical): No    Lack of Transportation (Non-Medical): No  Physical Activity: Inactive (06/22/2023)   Exercise Vital Sign    Days of Exercise per Week: 0 days    Minutes of Exercise per Session: 0 min  Stress: No Stress Concern Present (06/22/2023)   [employer] of Occupational Health - Occupational Stress Questionnaire    Feeling of Stress : Not at all  Social  Connections: Moderately Isolated (06/22/2023)   Social Connection and Isolation Panel [NHANES]    Frequency of Communication with Friends and Family: More than three times a week    Frequency of Social Gatherings with Friends and Family: Once a week    Attends Religious Services: Never    Database administrator or Organizations: No    Attends Banker Meetings: Never    Marital Status: Married  Catering manager Violence: Not At Risk (06/22/2023)   Humiliation, Afraid, Rape, and Kick questionnaire    Fear of Current or Ex-Partner: No    Emotionally Abused: No    Physically Abused: No    Sexually Abused: No    Past Surgical History:  Procedure Laterality Date   AORTIC VALVE REPLACEMENT     CARDIAC CATHETERIZATION  06/2003   CIRCUMCISION     COLONOSCOPY     CORONARY ARTERY BYPASS GRAFT     CORONARY ATHERECTOMY N/A 05/28/2022   Procedure: CORONARY ATHERECTOMY;  Surgeon: Swaziland, Peter M, MD;  Location: [facility] INVASIVE CV LAB;  Service: Cardiovascular;  Laterality: N/A;   CORONARY LITHOTRIPSY N/A  05/28/2022   Procedure: CORONARY LITHOTRIPSY;  Surgeon: Swaziland, Peter M, MD;  Location: [facility] INVASIVE CV LAB;  Service: Cardiovascular;  Laterality: N/A;   CORONARY STENT INTERVENTION N/A 05/28/2022   Procedure: CORONARY STENT INTERVENTION;  Surgeon: Swaziland, Peter M, MD;  Location: [facility] INVASIVE CV LAB;  Service: Cardiovascular;  Laterality: N/A;   CORONARY STENT INTERVENTION N/A 01/03/2023   Procedure: CORONARY STENT INTERVENTION;  Surgeon: Swaziland, Peter M, MD;  Location: [facility] INVASIVE CV LAB;  Service: Cardiovascular;  Laterality: N/A;   coronary stents      prior to bypass    EP IMPLANTABLE DEVICE N/A 05/06/2016   Procedure: PPM Generator Changeout;  Surgeon: Marinus Maw, MD;  Location: [facility] INVASIVE CV LAB;  Service: Cardiovascular;  Laterality: N/A;   ESOPHAGOGASTRODUODENOSCOPY     HERNIA REPAIR     INGUINAL HERNIA REPAIR Bilateral 02/07/2013   Procedure: LAPAROSCOPIC BILATERAL INGUINAL HERNIA REPAIR;  Surgeon: Wilmon Arms. Corliss Skains, MD;  Location: [facility] ORS;  Service: General;  Laterality: Bilateral;   INSERTION OF MESH Bilateral 02/07/2013   Procedure: INSERTION OF MESH;  Surgeon: Wilmon Arms. Corliss Skains, MD;  Location: [facility] ORS;  Service: General;  Laterality: Bilateral;   LEFT HEART CATH AND CORS/GRAFTS ANGIOGRAPHY N/A 05/27/2022   Procedure: LEFT HEART CATH AND CORS/GRAFTS ANGIOGRAPHY;  Surgeon: Corky Crafts, MD;  Location: [facility] INVASIVE CV LAB;  Service: Cardiovascular;  Laterality: N/A;   LEFT HEART CATH AND CORS/GRAFTS ANGIOGRAPHY N/A 01/03/2023   Procedure: LEFT HEART CATH AND CORS/GRAFTS ANGIOGRAPHY;  Surgeon: Swaziland, Peter M, MD;  Location: [facility] INVASIVE CV LAB;  Service: Cardiovascular;  Laterality: N/A;   LUMBAR LAMINECTOMY/DECOMPRESSION MICRODISCECTOMY N/A 03/15/2018   Procedure: Complete decompressive lumbar laminectomy for spinal stenosis of L4-L5 and foraminotomy for L4-L5 root on right;  Surgeon: Ranee Gosselin, MD;  Location: [facility] ORS;  Service: Orthopedics;  Laterality: N/A;   PACEMAKER  INSERTION     SHOULDER SURGERY Bilateral    SKIN BIOPSY  05/04/2019   BASAL CELL CARCINOMA//MID CENTRAL NASAL   SKIN BIOPSY  10/11/2019   Superficial basal cell carcinoma - left superior central forehead shave    Family History  Problem Relation Age of Onset   Heart attack Father    Kidney cancer Father    Hypertension Mother    Diabetes Mother    Diabetes Brother    Epilepsy Sister    Diabetes Sister  Diabetes Brother    Colon cancer Neg Hx    Rectal cancer Neg Hx    Stomach cancer Neg Hx    Esophageal cancer Neg Hx     Allergies  Allergen Reactions   Bee Venom Shortness Of Breath and Swelling   Codeine Phosphate Hives, Shortness Of Breath, Itching, Rash and Other (See Comments)    Tolerated multiple doses of IV morphine   Metoprolol Tartrate Other (See Comments)    loss of vision. Can take the XL tablet not regular    Pramipexole Other (See Comments)    Hallucinations     Tramadol     Pt has hallucinations    Ramipril Rash and Cough    Current Outpatient Medications on File Prior to Visit  Medication Sig Dispense Refill   ACCU-CHEK GUIDE test strip USE AS INSTRUCTED 300 strip 3   Accu-Chek Softclix Lancets lancets USE TO CHECK BLOOD SUGAR TWICE A DAY AND AS NEEDED 300 each 3   acetaminophen (TYLENOL) 650 MG CR tablet Take 1,300 mg by mouth 2 (two) times daily.     albuterol (VENTOLIN HFA) 108 (90 Base) MCG/ACT inhaler INHALE 2 PUFFS INTO THE LUNGS EVERY 6 HOURS AS NEEDED FOR WHEEZING OR SHORTNESS OF BREATH. 3 each 0   Ascorbic Acid (VITAMIN C) 1000 MG tablet Take 1,000 mg by mouth daily.     aspirin 81 MG chewable tablet Chew 1 tablet (81 mg total) by mouth daily.     atorvastatin (LIPITOR) 80 MG tablet TAKE 1 TABLET EVERY DAY 90 tablet 3   calcium carbonate (OS-CAL) 600 MG TABS Take 600 mg by mouth 2 (two) times daily with a meal.     carbidopa-levodopa (SINEMET CR) 50-200 MG tablet Take 1 tablet by mouth at bedtime.     carbidopa-levodopa (SINEMET IR) 25-100 MG  tablet Take 1 tablet by mouth 4 (four) times daily.     cetirizine (ZYRTEC) 10 MG tablet Take 10 mg by mouth daily.      cholecalciferol (VITAMIN D) 25 MCG (1000 UNIT) tablet Take 1,000 Units by mouth every evening.     fluticasone (FLONASE) 50 MCG/ACT nasal spray Place 2 sprays into both nostrils daily. (Patient taking differently: Place 2 sprays into both nostrils daily as needed for allergies or rhinitis.) 16 g 6   furosemide (LASIX) 40 MG tablet TAKE 1 TABLET EVERY DAY 30 tablet 0   isosorbide mononitrate (IMDUR) 30 MG 24 hr tablet Take 0.5 tablets (15 mg total) by mouth daily. 90 tablet 3   melatonin 5 MG TABS Take 5 mg by mouth at bedtime.     metFORMIN (GLUCOPHAGE) 500 MG tablet TAKE 1/2 TABLET EVERY DAY WITH BREAKFAST (SCHEDULE APPT FOR FUTURE REFILLS) (Patient taking differently: Take 250 mg by mouth daily with breakfast. TAKE 1/2 TABLET EVERY DAY WITH BREAKFAST (SCHEDULE APPT FOR FUTURE REFILLS)) 45 tablet 3   metoprolol succinate (TOPROL-XL) 50 MG 24 hr tablet Take 1 tablet (50 mg total) by mouth daily. Take with or immediately following a meal. 90 tablet 3   Misc Natural Products (TART CHERRY ADVANCED) CAPS Take 2 capsules by mouth daily.      Multiple Vitamin (MULTIVITAMIN) tablet Take 1 tablet by mouth daily.       nitroGLYCERIN (NITROSTAT) 0.4 MG SL tablet PLACE 1 TABLET (0.4 MG TOTAL) UNDER THE TONGUE EVERY 5 (FIVE) MINUTES AS NEEDED. FOR CHEST PAIN (Patient taking differently: Place 0.4 mg under the tongue every 5 (five) minutes as needed for chest pain. For  chest pain) 75 tablet 2   potassium chloride (KLOR-CON) 10 MEQ tablet TAKE 1 TABLET EVERY DAY 30 tablet 0   Probiotic Product (PHILLIPS COLON HEALTH PO) Take 1 capsule by mouth daily.     vitamin B-12 (CYANOCOBALAMIN) 1000 MCG tablet Take 1,000 mcg by mouth daily.     warfarin (COUMADIN) 10 MG tablet TAKE 1/2 TO 1 TABLET DAILY OR AS DIRECTED BY COUMADIN CLINIC 90 tablet 3   No current facility-administered medications on file  prior to visit.    BP 110/78   Pulse 82   Temp 98.1 F (36.7 C) (Oral)   Ht 5' 6.5" (1.689 m)   Wt 183 lb (83 kg)   SpO2 96%   BMI 29.09 kg/m       Objective:   Physical Exam Vitals and nursing note reviewed.  Constitutional:      General: He is not in acute distress.    Appearance: Normal appearance. He is not ill-appearing.  HENT:     Head: Normocephalic and atraumatic.     Right Ear: Tympanic membrane, ear canal and external ear normal. There is no impacted cerumen.     Left Ear: Tympanic membrane, ear canal and external ear normal. There is no impacted cerumen.     Nose: Nose normal. No congestion or rhinorrhea.     Mouth/Throat:     Mouth: Mucous membranes are moist.     Pharynx: Oropharynx is clear.  Eyes:     Extraocular Movements: Extraocular movements intact.     Conjunctiva/sclera: Conjunctivae normal.     Pupils: Pupils are equal, round, and reactive to light.  Neck:     Vascular: No carotid bruit.  Cardiovascular:     Rate and Rhythm: Normal rate and regular rhythm.     Pulses: Normal pulses.     Heart sounds: No murmur heard.    No friction rub. No gallop.  Pulmonary:     Effort: Pulmonary effort is normal.     Breath sounds: Normal breath sounds.  Abdominal:     General: Abdomen is flat. Bowel sounds are normal. There is no distension.     Palpations: Abdomen is soft. There is no mass.     Tenderness: There is no abdominal tenderness. There is no guarding or rebound.     Hernia: No hernia is present.  Musculoskeletal:        General: Normal range of motion.     Cervical back: Normal range of motion and neck supple.  Lymphadenopathy:     Cervical: No cervical adenopathy.  Skin:    General: Skin is warm and dry.     Capillary Refill: Capillary refill takes less than 2 seconds.  Neurological:     General: No focal deficit present.     Mental Status: He is alert and oriented to person, place, and time.     Motor: Tremor present.     Gait: Gait  abnormal.  Psychiatric:        Mood and Affect: Mood normal.        Behavior: Behavior normal.        Thought Content: Thought content normal.        Judgment: Judgment normal.       Assessment & Plan:  1. Routine general medical examination at a health care facility (Primary) Today patient counseled on age appropriate routine health concerns for screening and prevention, each reviewed and up to date or declined. Immunizations reviewed and up to date or  declined. Labs ordered and reviewed. Risk factors for depression reviewed and negative. Hearing function and visual acuity are intact. ADLs screened and addressed as needed. Functional ability and level of safety reviewed and appropriate. Education, counseling and referrals performed based on assessed risks today. Patient provided with a copy of personalized plan for preventive services.   2. NSTEMI (non-ST elevated myocardial infarction) [facility]) - Per cardiology  - Lipid panel; Future - TSH; Future - CBC; Future - Comprehensive metabolic panel; Future  3. Diabetes mellitus treated with oral medication (HCC) - Consider dose change of synthroid  - Lipid panel; Future - TSH; Future - CBC; Future - Comprehensive metabolic panel; Future - Hemoglobin A1c; Future - Microalbumin/Creatinine Ratio, Urine; Future - metFORMIN (GLUCOPHAGE) 500 MG tablet; TAKE 1/2 TABLET EVERY DAY WITH BREAKFAST  Dispense: 45 tablet; Refill: 3  4. Primary hypertension - well controlled. No change in medication  - Lipid panel; Future - TSH; Future - CBC; Future - Comprehensive metabolic panel; Future - furosemide (LASIX) 40 MG tablet; Take 1 tablet (40 mg total) by mouth daily.  Dispense: 90 tablet; Refill: 3 - potassium chloride (KLOR-CON) 10 MEQ tablet; Take 1 tablet (10 mEq total) by mouth daily.  Dispense: 90 tablet; Refill: 3  5. Hyperlipidemia, unspecified hyperlipidemia type - Continue statin and asa - Lipid panel; Future - TSH; Future - CBC;  Future - Comprehensive metabolic panel; Future  6. Coronary artery disease involving autologous vein coronary bypass graft without angina pectoris - Continue statin and asa  - Lipid panel; Future - TSH; Future - CBC; Future - Comprehensive metabolic panel; Future  7. Aortic valve replaced - Per Cardiology  - Lipid panel; Future - TSH; Future - CBC; Future - Comprehensive metabolic panel; Future  8. Primary osteoarthritis of both knees - Continue with steroid injections as needed - Lipid panel; Future - TSH; Future - CBC; Future - Comprehensive metabolic panel; Future  9. Parkinson's disease, unspecified whether dyskinesia present, unspecified whether manifestations fluctuate (HCC) - Follow up with Neurology as directed - Lipid panel; Future - TSH; Future - CBC; Future - Comprehensive metabolic panel; Future  [name], NP

## 2023-06-29 ENCOUNTER — Other Ambulatory Visit: Payer: Self-pay

## 2023-06-29 NOTE — Patient Outreach (Signed)
  Care Management   Visit Note  06/29/2023 Name: [name] MRN: [id number] DOB: 12-16-1945  Subjective: [name] is a 77 y.o. year old male who is a primary care patient of [name], NP. The Care Management team was consulted for assistance.      Engaged with [name] via telephone.  Assessment:   Review of patient past medical history, allergies, medications, health status, including review of consultants reports, laboratory and other test data, was performed as part of  evaluation and provision of care management services.    Outpatient Encounter Medications as of 06/29/2023  Medication Sig   acetaminophen (TYLENOL) 650 MG CR tablet Take 1,300 mg by mouth 2 (two) times daily.   albuterol (VENTOLIN HFA) 108 (90 Base) MCG/ACT inhaler INHALE 2 PUFFS INTO THE LUNGS EVERY 6 HOURS AS NEEDED FOR WHEEZING OR SHORTNESS OF BREATH.   Ascorbic Acid (VITAMIN C) 1000 MG tablet Take 1,000 mg by mouth daily.   aspirin 81 MG chewable tablet Chew 1 tablet (81 mg total) by mouth daily.   atorvastatin (LIPITOR) 80 MG tablet TAKE 1 TABLET EVERY DAY   calcium carbonate (OS-CAL) 600 MG TABS Take 600 mg by mouth 2 (two) times daily with a meal.   carbidopa-levodopa (SINEMET CR) 50-200 MG tablet Take 1 tablet by mouth at bedtime.   carbidopa-levodopa (SINEMET IR) 25-100 MG tablet Take 1 tablet by mouth 4 (four) times daily.   cetirizine (ZYRTEC) 10 MG tablet Take 10 mg by mouth daily.    cholecalciferol (VITAMIN D) 25 MCG (1000 UNIT) tablet Take 1,000 Units by mouth every evening.   fluticasone (FLONASE) 50 MCG/ACT nasal spray Place 2 sprays into both nostrils daily. (Patient taking differently: Place 2 sprays into both nostrils daily as needed for allergies or rhinitis.)   furosemide (LASIX) 40 MG tablet Take 1 tablet (40 mg total) by mouth daily.   isosorbide mononitrate (IMDUR) 30 MG 24 hr tablet Take 0.5 tablets (15 mg total) by mouth daily.   melatonin 5 MG TABS Take 5 mg by mouth  at bedtime.   metFORMIN (GLUCOPHAGE) 500 MG tablet TAKE 1/2 TABLET EVERY DAY WITH BREAKFAST   metoprolol succinate (TOPROL-XL) 50 MG 24 hr tablet Take 1 tablet (50 mg total) by mouth daily. Take with or immediately following a meal.   Misc Natural Products (TART CHERRY ADVANCED) CAPS Take 2 capsules by mouth daily.    Multiple Vitamin (MULTIVITAMIN) tablet Take 1 tablet by mouth daily.     nitroGLYCERIN (NITROSTAT) 0.4 MG SL tablet PLACE 1 TABLET (0.4 MG TOTAL) UNDER THE TONGUE EVERY 5 (FIVE) MINUTES AS NEEDED. FOR CHEST PAIN (Patient taking differently: Place 0.4 mg under the tongue every 5 (five) minutes as needed for chest pain. For chest pain)   potassium chloride (KLOR-CON) 10 MEQ tablet Take 1 tablet (10 mEq total) by mouth daily.   Probiotic Product (PHILLIPS COLON HEALTH PO) Take 1 capsule by mouth daily.   vitamin B-12 (CYANOCOBALAMIN) 1000 MCG tablet Take 1,000 mcg by mouth daily.   warfarin (COUMADIN) 10 MG tablet TAKE 1/2 TO 1 TABLET DAILY OR AS DIRECTED BY COUMADIN CLINIC   [DISCONTINUED] ACCU-CHEK GUIDE test strip USE AS INSTRUCTED   [DISCONTINUED] Accu-Chek Softclix Lancets lancets USE TO CHECK BLOOD SUGAR TWICE A DAY AND AS NEEDED   No facility-administered encounter medications on file as of 06/29/2023.

## 2023-07-07 ENCOUNTER — Other Ambulatory Visit: Payer: Self-pay | Admitting: Adult Health

## 2023-07-12 ENCOUNTER — Ambulatory Visit: Payer: Medicare HMO | Attending: Cardiovascular Disease

## 2023-07-12 DIAGNOSIS — Z952 Presence of prosthetic heart valve: Secondary | ICD-10-CM | POA: Diagnosis not present

## 2023-07-12 DIAGNOSIS — Z5181 Encounter for therapeutic drug level monitoring: Secondary | ICD-10-CM | POA: Diagnosis not present

## 2023-07-12 DIAGNOSIS — Z09 Encounter for follow-up examination after completed treatment for conditions other than malignant neoplasm: Secondary | ICD-10-CM

## 2023-07-12 LAB — POCT INR: INR: 2.5 (ref 2.0–3.0)

## 2023-07-12 NOTE — Patient Instructions (Signed)
 Description   Continue on same dosage of Warfarin 1 tablet daily except for 1/2 tablet on Tuesdays and Thursdays. Remain consistent with green leafy veggies. Recheck INR in 4 weeks.  Coumadin Clinic [phone] or [phone]

## 2023-07-18 ENCOUNTER — Other Ambulatory Visit: Payer: Self-pay | Admitting: Cardiology

## 2023-07-18 ENCOUNTER — Other Ambulatory Visit: Payer: Self-pay | Admitting: Adult Health

## 2023-07-26 ENCOUNTER — Ambulatory Visit: Payer: Medicare HMO | Attending: Cardiology | Admitting: Cardiology

## 2023-07-26 ENCOUNTER — Other Ambulatory Visit: Payer: Self-pay

## 2023-07-26 ENCOUNTER — Encounter: Payer: Self-pay | Admitting: Cardiology

## 2023-07-26 VITALS — BP 120/70 | HR 82 | Ht 66.75 in | Wt 184.4 lb

## 2023-07-26 DIAGNOSIS — I77810 Thoracic aortic ectasia: Secondary | ICD-10-CM

## 2023-07-26 DIAGNOSIS — G20B1 Parkinson's disease with dyskinesia, without mention of fluctuations: Secondary | ICD-10-CM | POA: Diagnosis not present

## 2023-07-26 DIAGNOSIS — I7123 Aneurysm of the descending thoracic aorta, without rupture: Secondary | ICD-10-CM

## 2023-07-26 DIAGNOSIS — Z9889 Other specified postprocedural states: Secondary | ICD-10-CM

## 2023-07-26 DIAGNOSIS — Z8679 Personal history of other diseases of the circulatory system: Secondary | ICD-10-CM | POA: Diagnosis not present

## 2023-07-26 NOTE — Progress Notes (Signed)
 Per verbal from Dr. Anne Fu, order CT angio for October 2025.

## 2023-07-26 NOTE — Patient Instructions (Addendum)
 Medication Instructions:  Your physician recommends that you continue on your current medications as directed. Please refer to the Current Medication list given to you today.  *If you need a refill on your cardiac medications before your next appointment, please call your pharmacy*  Lab Work: None ordered today. If you have labs (blood work) drawn today and your tests are completely normal, you will receive your results only by: MyChart Message (if you have MyChart) OR A paper copy in the mail If you have any lab test that is abnormal or we need to change your treatment, we will call you to review the results.  Testing/Procedures: None ordered today.  Follow-Up: At [facility], you and your health needs are our priority.  As part of our continuing mission to provide you with exceptional heart care, we have created designated Provider Care Teams.  These Care Teams include your primary Cardiologist (physician) and Advanced Practice Providers (APPs -  Physician Assistants and Nurse Practitioners) who all work together to provide you with the care you need, when you need it.  We recommend signing up for the patient portal called "MyChart".  Sign up information is provided on this After Visit Summary.  MyChart is used to connect with patients for Virtual Visits (Telemedicine).  Patients are able to view lab/test results, encounter notes, upcoming appointments, etc.  Non-urgent messages can be sent to your provider as well.   To learn more about what you can do with MyChart, go to [email or web address].    Your next appointment:   1 year(s)  The format for your next appointment:   In Person  Provider:   Dorothye Gathers, MD {

## 2023-07-26 NOTE — Progress Notes (Signed)
 Cardiology Office Note:  .   Date:  07/26/2023  ID:  [name], DOB 1946/03/31, MRN [id number] PCP: Merna Huxley, NP  [facility] Providers Cardiologist:  Oneil Parchment, MD Electrophysiologist:  Danelle Birmingham, MD     History of Present Illness: .   [name] is a 78 y.o. male Discussed with the use of AI scribe   History of Present Illness   The patient, a 78 year old with a history of coronary artery disease, mechanical aortic valve replacement, Parkinson's disease, and hyperlipidemia, presents for a follow-up visit. The patient had a cardiac catheterization on 01/03/23, which revealed focal severe instant restenosis of the proximal left circumflex. 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 The patient is on lifelong Plavix  and warfarin therapy, with close follow-up and stable hemoglobin levels.  The patient's Parkinson's disease is evident with a noted tremor on examination. Hyperlipidemia is managed with atorvastatin  80mg , resulting in an LDL of 63. A CT scan of the chest on 04/15/23 measured the ascending thoracic aorta at 46mm, indicating moderate dilation.  The patient also has a pacemaker, implanted in 2017, for complete heart block. The patient inquired about the battery life of the pacemaker, with the last transmission in October indicating a remaining longevity of approximately three years and eight months.  The patient also reported a history of bruising and prolonged bleeding while on Plavix . The patient's caregiver noted that the patient's arms were dark with bruises and that minor injuries would not stop bleeding. The patient was taken off Plavix  and switched back to baby aspirin , which led to an improvement in the bruising.  The patient's overall condition appears stable, with no new symptoms or complications reported during the visit.          ROS: No CP  Studies Reviewed: .        Results   LABS LDL: 63  RADIOLOGY CT  chest: Ascending thoracic aorta 46 mm (04/15/2023)  DIAGNOSTIC Cardiac catheterization: Focal severe in-stent restenosis of the proximal left circumflex (01/03/2023) Pacemaker transmission: Remaining longevity 3 years 8 months (04/2023)     Risk Assessment/Calculations:            Physical Exam:   VS:  BP 120/70   Pulse 82   Ht 5' 6.75 (1.695 m)   Wt 184 lb 6.4 oz (83.6 kg)   SpO2 95%   BMI 29.10 kg/m    Wt Readings from Last 3 Encounters:  07/26/23 184 lb 6.4 oz (83.6 kg)  06/23/23 183 lb (83 kg)  06/22/23 180 lb (81.6 kg)    GEN: Well nourished, well developed in no acute distress NECK: No JVD; No carotid bruits CARDIAC: S2 click RRR, no murmurs, no rubs, no gallops RESPIRATORY:  Clear to auscultation without rales, wheezing or rhonchi  ABDOMEN: Soft, non-tender, non-distended EXTREMITIES:  No edema; No deformity   ASSESSMENT AND PLAN: .    Assessment and Plan    Coronary Artery Disease (CAD)   Follow-up for CAD with focal severe in-stent restenosis of the proximal left circumflex treated on 01/03/2023. Known occlusion of SVG to left circumflex. On lifelong Plavix  and warfarin. Mechanical aortic valve on Coumadin  with well-managed hemoglobin. Discussed bleeding risks with anticoagulation therapy, especially given bruising and prolonged bleeding history. Benefits include thromboembolic event prevention. Alternatives considered but current regimen necessary.   - Continue Plavix  and warfarin lifelong   - Close follow-up for mechanical aortic valve    Ascending Thoracic Aortic  Aneurysm   CT chest on 04/15/2023 showed ascending thoracic aorta measuring 46 mm, moderately dilated. Measurements stable. Discussed importance of monitoring to prevent complications such as rupture. Plan to repeat CT scan in October 2025.   - Repeat CT scan in October 2025 to monitor aortic size    Complete Heart Block with Pacemaker   Pacemaker placed in 2017. Battery longevity check in October  2024 showed approximately 3 years and 8 months remaining. Discussed need for pacemaker replacement in approximately 3 years to ensure continued cardiac function.   - Plan for pacemaker replacement in approximately 3 years    Hyperlipidemia   Managed with atorvastatin  80 mg. LDL well-controlled at 63 mg/dL.   - Continue atorvastatin  80 mg    Parkinson's Disease   Chronic condition with noted tremor on exam. Managed with carbidopa -levodopa .   - Continue carbidopa -levodopa     General Health Maintenance   Discussed importance of avoiding falls and injuries, especially given anticoagulation therapy and Parkinson's disease. Emphasized need to avoid icy and slippery conditions to prevent falls.   - Advise to avoid icy and slippery conditions to prevent falls    Goals of Care   Discussed advanced directives and goals of care. He and family have planned for end-of-life care, including burial arrangements. Emphasized importance of these preparations in guiding future medical decisions.   - Document advanced directives and ensure they are accessible in medical records.    Follow-up   - Follow-up in one year   - Repeat CT scan in October 2025 to monitor aortic size.               Signed, Oneil Parchment, MD

## 2023-07-29 ENCOUNTER — Other Ambulatory Visit: Payer: Self-pay

## 2023-07-29 NOTE — Patient Outreach (Addendum)
 Care Management   Visit Note  07/29/2023 Name: [name] MRN: [id number] DOB: 28-Mar-1946  Subjective: [name] is a 78 y.o. year old male who is a primary care patient of Alto Atta, NP. The Care Management team was consulted for assistance.      Engaged with [name] and his spouse [name] via telephone.  Assessment:  Review of patient past medical history, allergies, medications, health status, including review of consultants reports, laboratory and other test data, was performed as part of  evaluation and provision of care management services.    Outpatient Encounter Medications as of 07/29/2023  Medication Sig   ACCU-CHEK GUIDE TEST test strip USE AS INSTRUCTED   Accu-Chek Softclix Lancets lancets USE TO CHECK BLOOD SUGAR TWICE A DAY AND AS NEEDED   acetaminophen (TYLENOL) 650 MG CR tablet Take 1,300 mg by mouth 2 (two) times daily.   albuterol (VENTOLIN HFA) 108 (90 Base) MCG/ACT inhaler INHALE 2 PUFFS INTO THE LUNGS EVERY 6 HOURS AS NEEDED FOR WHEEZING OR SHORTNESS OF BREATH.   Ascorbic Acid (VITAMIN C) 1000 MG tablet Take 1,000 mg by mouth daily.   aspirin 81 MG chewable tablet Chew 1 tablet (81 mg total) by mouth daily.   atorvastatin (LIPITOR) 80 MG tablet TAKE 1 TABLET EVERY DAY   calcium carbonate (OS-CAL) 600 MG TABS Take 600 mg by mouth 2 (two) times daily with a meal.   carbidopa-levodopa (SINEMET CR) 50-200 MG tablet Take 1 tablet by mouth at bedtime.   carbidopa-levodopa (SINEMET IR) 25-100 MG tablet Take 1 tablet by mouth 4 (four) times daily.   cetirizine (ZYRTEC) 10 MG tablet Take 10 mg by mouth daily.    cholecalciferol (VITAMIN D) 25 MCG (1000 UNIT) tablet Take 1,000 Units by mouth every evening.   fluticasone (FLONASE) 50 MCG/ACT nasal spray Place 2 sprays into both nostrils daily. (Patient taking differently: Place 2 sprays into both nostrils daily as needed for allergies or rhinitis.)   furosemide (LASIX) 40 MG tablet Take 1 tablet (40 mg  total) by mouth daily.   isosorbide mononitrate (IMDUR) 30 MG 24 hr tablet Take 0.5 tablets (15 mg total) by mouth daily.   melatonin 5 MG TABS Take 5 mg by mouth at bedtime.   metFORMIN (GLUCOPHAGE) 500 MG tablet TAKE 1/2 TABLET EVERY DAY WITH BREAKFAST   metoprolol succinate (TOPROL-XL) 50 MG 24 hr tablet TAKE 1 TABLET (50 MG TOTAL) BY MOUTH DAILY. TAKE WITH OR IMMEDIATELY FOLLOWING A MEAL.   Misc Natural Products (TART CHERRY ADVANCED) CAPS Take 2 capsules by mouth daily.    Multiple Vitamin (MULTIVITAMIN) tablet Take 1 tablet by mouth daily.     nitroGLYCERIN (NITROSTAT) 0.4 MG SL tablet PLACE 1 TABLET (0.4 MG TOTAL) UNDER THE TONGUE EVERY 5 (FIVE) MINUTES AS NEEDED. FOR CHEST PAIN (Patient taking differently: Place 0.4 mg under the tongue every 5 (five) minutes as needed for chest pain. For chest pain)   potassium chloride (KLOR-CON) 10 MEQ tablet Take 1 tablet (10 mEq total) by mouth daily.   Probiotic Product (PHILLIPS COLON HEALTH PO) Take 1 capsule by mouth daily.   vitamin B-12 (CYANOCOBALAMIN) 1000 MCG tablet Take 1,000 mcg by mouth daily.   warfarin (COUMADIN) 10 MG tablet TAKE 1/2 TO 1 TABLET DAILY OR AS DIRECTED BY COUMADIN CLINIC   No facility-administered encounter medications on file as of 07/29/2023.     Goals       Care Management      Current Barriers:  Care  Management support and education needs related to CAD, HTN, CHF, HLD, Type 2 DM and Parkinsons.  Planned Interventions: CAD, HTN, HLD and CHF. Reviewed medications with spouse/caregiver.  Reports patient is taking all medications as prescribed. She continues to assist with medication management. Reports adjusting coumadin doses as instructed. Denies current concerns regarding prescription cost. Reviewed role and benefits of statin for ASCVD risk reduction. Reports tolerating statin well. Reviewed established blood pressure parameters. Reports monitoring consistently. Systolic readings have ranged from the 110s to 130s.  Diastolic readings have ranged in the 70s to low 80s. Reading today was 118/70. Advised to continue monitoring and maintain a log. Reviewed indications for notifying a provider. Reviewed weights. Reports monitoring consistently. Weight today was 178 lbs. Reminded of need to notify the Cardiology team for weight gain greater than 3 lbs overnight or 5 lbs within a week. Reviewed symptoms. Denies chest pain, palpitations, headaches, lightheadedness, or dizziness. Denies sudden visual changes.  Reports occasional, minimal swelling in lower extremities. Denies abdominal swelling or tightness. Reports some fatigue and dyspnea with activity but overall doing well. Encouraged to continue monitoring symptoms daily and contact clinic with changes. Discussed nutritional intake. Advised to closely monitor sodium consumption and avoid highly processed foods when possible. Reviewed compliance with recommended cardiac prudent diet. He continues doing very well with nutritional intake. Reports reading nutrition labels and limiting sodium, cholesterol, and saturated fats.  Reviewed safety and fall prevention measures. Advised to continue using assistive device with all ambulation to decrease risks of falls. Reviewed pending appointments: Will continue regular outreach with the Coumadin clinic. Pending follow up with [name] on 07/26/23. Contacted Primary Care clinic today to schedule follow up with PCP on 06/23/23. Reviewed s/sx of heart attack, stroke and CHF exacerbation. Reviewed worsening symptoms that require immediate medical attention.   BP Readings from Last 3 Encounters:  07/26/23 120/70  06/23/23 110/78  01/20/23 120/68    Wt Readings from Last 3 Encounters:  06/23/23 183 lb (83 kg)  06/22/23 180 lb (81.6 kg)  01/20/23 184 lb (83.5 kg)       Lab Results  Component Value Date   CHOL 132 06/10/2022   HDL 46 06/10/2022   LDLCALC 63 06/10/2022   TRIG 128 06/10/2022   CHOLHDL 2.9 06/10/2022      Patient Goals/Self-Care  Take medications as prescribed   Attend all scheduled provider appointments Call pharmacy for medication refills 3-7 days in advance of running out of medications Continue monitoring BP and maintain a log Notify provider for BP readings outside of established range Continue weighing daily. Contact provider for weight gain greater than 2 lbs overnight or 5 lbs in a week Report new symptoms to your doctor Continue engaging in low impact activity as tolerated and use assistive device.  ------------------------------------------------------------------------------------------------------------ Planned Interventions: Diabetes Reviewed plan for diabetes management. Reviewed medications. Reports taking metformin as prescribed. A1C is currently within goal. Reviewed blood glucose readings. Reports monitoring fasting levels consistently and keeping a log. Reports fasting readings have ranged from the 90s to 110s. Reading today was 115 mg/dl. He currently receives injections for knee pain at [facility]. Last injection was administered on 04/11/23. Reviewed side effects of steroid injections and temporary increase in blood glucose levels.  Reviewed s/sx of hypoglycemia and hyperglycemia along with recommended interventions. Denies hypoglycemic or hyperglycemic episodes. Reports compliance with recommended diabetic diet. Reports consuming fruits, vegetables, and leans proteins. Reports occasional consuming sweets but closely monitoring intake of carbs and added sugars.  Reviewed DM  preventive care. Eye exam and foot exam are up to date. Spouse continues to assist with daily foot care.  Advised to complete labs as prescribed.  Contacted clinic scheduler to schedule follow up with PCP on 06/23/23. Reviewed instructions r/t pending labs. Aware of need to fast 8 hours prior to appointment.  Lab Results  Component Value Date   HGBA1C 6.1 01/20/2023     Patient Goals/Self  Care: Take medications as prescribed   Attend scheduled medical appointments Call pharmacy for medication refills 3-7 days in advance of running out of medications Call provider office for new concerns or questions  Continue monitoring blood sugars daily and maintain a log. Wash and dry feet carefully every day Check feet daily for cuts, sores or redness Trim toenails straight across Continue wearing comfortable, well-fitting shoes Continue adhering to recommended ADA diet.        Exercise 3x per week (30 min per time)      No current goals (pt-stated)      Stay active (pt-stated)          [facility] RN Care Manager Direct Dial: [phone]  Fax: [phone] Website: [email or web address]

## 2023-08-04 ENCOUNTER — Ambulatory Visit: Payer: Medicare HMO

## 2023-08-04 DIAGNOSIS — I442 Atrioventricular block, complete: Secondary | ICD-10-CM

## 2023-08-04 LAB — CUP PACEART REMOTE DEVICE CHECK
Battery Remaining Longevity: 41 mo
Battery Remaining Percentage: 33 %
Battery Voltage: 2.95 V
Brady Statistic AP VP Percent: 30 %
Brady Statistic AP VS Percent: 1 %
Brady Statistic AS VP Percent: 70 %
Brady Statistic AS VS Percent: 1 %
Brady Statistic RA Percent Paced: 30 %
Brady Statistic RV Percent Paced: 99 %
Date Time Interrogation Session: 20250123020016
Implantable Lead Connection Status: 753985
Implantable Lead Connection Status: 753985
Implantable Lead Implant Date: 20050816
Implantable Lead Implant Date: 20050816
Implantable Lead Location: 753859
Implantable Lead Location: 753860
Implantable Pulse Generator Implant Date: 20171026
Lead Channel Impedance Value: 480 Ohm
Lead Channel Impedance Value: 630 Ohm
Lead Channel Pacing Threshold Amplitude: 0.5 V
Lead Channel Pacing Threshold Amplitude: 1 V
Lead Channel Pacing Threshold Pulse Width: 0.5 ms
Lead Channel Pacing Threshold Pulse Width: 0.5 ms
Lead Channel Sensing Intrinsic Amplitude: 12 mV
Lead Channel Sensing Intrinsic Amplitude: 2.4 mV
Lead Channel Setting Pacing Amplitude: 0.75 V
Lead Channel Setting Pacing Amplitude: 2 V
Lead Channel Setting Pacing Pulse Width: 0.5 ms
Lead Channel Setting Sensing Sensitivity: 4 mV
Pulse Gen Model: 2272
Pulse Gen Serial Number: 3182792

## 2023-08-05 ENCOUNTER — Encounter: Payer: Self-pay | Admitting: Internal Medicine

## 2023-08-08 DIAGNOSIS — G20A2 Parkinson's disease without dyskinesia, with fluctuations: Secondary | ICD-10-CM | POA: Diagnosis not present

## 2023-08-08 DIAGNOSIS — G629 Polyneuropathy, unspecified: Secondary | ICD-10-CM | POA: Diagnosis not present

## 2023-08-09 ENCOUNTER — Ambulatory Visit: Payer: Medicare HMO | Attending: Cardiology

## 2023-08-09 DIAGNOSIS — Z952 Presence of prosthetic heart valve: Secondary | ICD-10-CM

## 2023-08-09 DIAGNOSIS — Z5181 Encounter for therapeutic drug level monitoring: Secondary | ICD-10-CM | POA: Diagnosis not present

## 2023-08-09 DIAGNOSIS — Z09 Encounter for follow-up examination after completed treatment for conditions other than malignant neoplasm: Secondary | ICD-10-CM

## 2023-08-09 LAB — POCT INR: INR: 2.2 (ref 2.0–3.0)

## 2023-08-09 NOTE — Patient Instructions (Signed)
Continue on same dosage of Warfarin 1 tablet daily except for 1/2 tablet on Tuesdays and Thursdays. Remain consistent with green leafy veggies. Recheck INR in 6 weeks.  Coumadin Clinic [phone] or [phone]

## 2023-09-14 NOTE — Progress Notes (Signed)
 Remote pacemaker transmission.

## 2023-09-20 ENCOUNTER — Ambulatory Visit: Payer: Medicare HMO | Attending: Cardiovascular Disease | Admitting: *Deleted

## 2023-09-20 DIAGNOSIS — Z09 Encounter for follow-up examination after completed treatment for conditions other than malignant neoplasm: Secondary | ICD-10-CM

## 2023-09-20 DIAGNOSIS — M25562 Pain in left knee: Secondary | ICD-10-CM | POA: Diagnosis not present

## 2023-09-20 DIAGNOSIS — Z952 Presence of prosthetic heart valve: Secondary | ICD-10-CM

## 2023-09-20 DIAGNOSIS — M25561 Pain in right knee: Secondary | ICD-10-CM | POA: Diagnosis not present

## 2023-09-20 DIAGNOSIS — Z5181 Encounter for therapeutic drug level monitoring: Secondary | ICD-10-CM

## 2023-09-20 LAB — POCT INR: INR: 2.2 (ref 2.0–3.0)

## 2023-09-20 NOTE — Patient Instructions (Addendum)
 Description   Continue taking Warfarin 1 tablet daily except for 1/2 tablet on Tuesdays and Thursdays. Remain consistent with green leafy veggies. Recheck INR in 6 weeks.  Coumadin Clinic [phone] or [phone]

## 2023-09-22 ENCOUNTER — Other Ambulatory Visit: Payer: Self-pay | Admitting: Cardiology

## 2023-10-06 ENCOUNTER — Other Ambulatory Visit: Payer: Self-pay | Admitting: Cardiology

## 2023-10-06 ENCOUNTER — Other Ambulatory Visit: Payer: Self-pay | Admitting: Adult Health

## 2023-11-01 ENCOUNTER — Ambulatory Visit: Attending: Cardiology | Admitting: *Deleted

## 2023-11-01 DIAGNOSIS — Z09 Encounter for follow-up examination after completed treatment for conditions other than malignant neoplasm: Secondary | ICD-10-CM

## 2023-11-01 DIAGNOSIS — Z5181 Encounter for therapeutic drug level monitoring: Secondary | ICD-10-CM

## 2023-11-01 DIAGNOSIS — Z952 Presence of prosthetic heart valve: Secondary | ICD-10-CM

## 2023-11-01 LAB — POCT INR: INR: 2.2 (ref 2.0–3.0)

## 2023-11-01 NOTE — Patient Instructions (Addendum)
 Description   Continue taking Warfarin 1 tablet daily except for 1/2 tablet on Tuesdays and Thursdays. Remain consistent with green leafy veggies. Recheck INR in 6 weeks.  Coumadin Clinic [phone] or [phone]

## 2023-11-02 DIAGNOSIS — G629 Polyneuropathy, unspecified: Secondary | ICD-10-CM | POA: Diagnosis not present

## 2023-11-02 DIAGNOSIS — G20A2 Parkinson's disease without dyskinesia, with fluctuations: Secondary | ICD-10-CM | POA: Diagnosis not present

## 2023-11-03 ENCOUNTER — Ambulatory Visit (INDEPENDENT_AMBULATORY_CARE_PROVIDER_SITE_OTHER): Payer: Medicare HMO

## 2023-11-03 DIAGNOSIS — I5032 Chronic diastolic (congestive) heart failure: Secondary | ICD-10-CM

## 2023-11-03 LAB — CUP PACEART REMOTE DEVICE CHECK
Battery Remaining Longevity: 38 mo
Battery Remaining Percentage: 30 %
Battery Voltage: 2.93 V
Brady Statistic AP VP Percent: 30 %
Brady Statistic AP VS Percent: 1 %
Brady Statistic AS VP Percent: 69 %
Brady Statistic AS VS Percent: 1 %
Brady Statistic RA Percent Paced: 30 %
Brady Statistic RV Percent Paced: 99 %
Date Time Interrogation Session: 20250424020014
Implantable Lead Connection Status: 753985
Implantable Lead Connection Status: 753985
Implantable Lead Implant Date: 20050816
Implantable Lead Implant Date: 20050816
Implantable Lead Location: 753859
Implantable Lead Location: 753860
Implantable Pulse Generator Implant Date: 20171026
Lead Channel Impedance Value: 490 Ohm
Lead Channel Impedance Value: 660 Ohm
Lead Channel Pacing Threshold Amplitude: 0.5 V
Lead Channel Pacing Threshold Amplitude: 1.125 V
Lead Channel Pacing Threshold Pulse Width: 0.5 ms
Lead Channel Pacing Threshold Pulse Width: 0.5 ms
Lead Channel Sensing Intrinsic Amplitude: 12 mV
Lead Channel Sensing Intrinsic Amplitude: 3.4 mV
Lead Channel Setting Pacing Amplitude: 0.75 V
Lead Channel Setting Pacing Amplitude: 2.125
Lead Channel Setting Pacing Pulse Width: 0.5 ms
Lead Channel Setting Sensing Sensitivity: 4 mV
Pulse Gen Model: 2272
Pulse Gen Serial Number: 3182792

## 2023-11-08 ENCOUNTER — Encounter: Payer: Self-pay | Admitting: Internal Medicine

## 2023-11-10 DIAGNOSIS — E119 Type 2 diabetes mellitus without complications: Secondary | ICD-10-CM | POA: Diagnosis not present

## 2023-11-28 ENCOUNTER — Other Ambulatory Visit: Payer: Self-pay

## 2023-11-28 NOTE — Patient Instructions (Addendum)
 Thank you for allowing the Chronic Care Management team to participate in your care. It was great speaking with you today.  Keep up the great work!!  Reminders: Please attend your PCP appointment as scheduled on December 22, 2023.   Our next telephone outreach is scheduled for January 06, 2024 at 1100. Please do not hesitate to contact me if you require assistance prior to our next outreach.   Roxie Cord [facility] RN Care Manager Direct Dial: [phone]  Fax: [phone] Website: [email or web address]

## 2023-11-28 NOTE — Patient Outreach (Signed)
 Complex Care Management   Visit Note  11/28/2023  Name:  [name] MRN: [id number] DOB: [birth year]  Situation: Referral received for Complex Care Management related to Heart Failure, HTN, HLD, DM and Parkinson's Disease. I obtained verbal consent from Patient.  Visit completed with [name] and his spouse/caregiver via phone today.  Background:   Past Medical History:  Diagnosis Date   Acute renal failure (HCC) 02/09/2013   Aortic stenosis 08/25/2010   a. Bentall aortic root replacement with a St. Jude mechanical valve and Hemashield conduit 02/2004.   Arthritis    Asthma    AV BLOCK, COMPLETE    a. s/p St Jude dual chamber pacemaker 02/2004.   CAD (coronary artery disease)    a. s/p CABGx2 (LIMA-dLAD, SVG-Cx). b. Low risk nuc 12/2012 without ischemia, EF 46% mild apical hypokinesia (EF 55% inf HK by echo).   Carotid artery disease (HCC)    a. 0-39% bilateral ICA stenosis, stable mild hard plaque in carotid bulbs. F/u 03/2014 recommended.   Diabetes mellitus without complication (HCC)    type 2    DIVERTICULOSIS, COLON 10/17/2007   Ejection fraction    a. EF 55% with inf HK, mild MR by echo 12/2012.   GERD (gastroesophageal reflux disease)    hxof    GOUT 01/16/2007   Headache    hx of migraines    HEMORRHOIDS, INTERNAL 11/01/2008   Hx of adenomatous polyp of colon 02/2019   no recall needed   HYPERLIPIDEMIA 01/16/2007   HYPERTENSION 01/16/2007   Impaired glucose tolerance    LEG EDEMA 11/27/2008   Special screening for malignant neoplasm of prostate    Warfarin anticoagulation    AVR       Assessment: Patient Reported Symptoms: Cognitive Cognitive Status: Alert and oriented to person, place, and time, Normal speech and language skills Cognitive/Intellectual Conditions Management [RPT]: None reported or documented in medical history or problem list Health Maintenance Behaviors: Hobbies, Spiritual practice(s) Healing Pattern: Unsure Health Facilitated by: Rest, Pain  control, Healthy diet  Neurological Neurological Review of Symptoms: No symptoms reported Neurological Conditions: Parkinson's disease Neurological Management Strategies: Coping strategies, Medical device, Medication therapy, Routine screening Neurological Self-Management Outcome: 5 (very good)  HEENT HEENT Symptoms Reported: No symptoms reported HEENT Conditions: Vision problem(s) Vision Problems: cataract(s) HEENT Management Strategies: Medical device, Routine screening HEENT Self-Management Outcome: 4 (good) Vision problem(s)  Cardiovascular Cardiovascular Symptoms Reported: No symptoms reported Does patient have uncontrolled Hypertension?: No Cardiovascular Conditions: High blood cholesterol, Hypertension, Coronary artery disease, Heart failure, Valvular disease (History of NSTEMi,Has a Pacemaker) Cardiovascular Management Strategies: Adequate rest, Coping strategies, Diet modification, Fluid modification, Medical device, Medication therapy, Routine screening, Weight management Do You Have a Working Readable Scale?: Yes Weight: 177 lb (80.3 kg) Cardiovascular Self-Management Outcome: 4 (good)  Respiratory Respiratory Symptoms Reported: No symptoms reported  Endocrine Patient reports the following symptoms related to hypoglycemia or hyperglycemia : No symptoms reported Is patient diabetic?: Yes Is patient checking blood sugars at home?: Yes Endocrine Conditions: Diabetes Endocrine Management Strategies: Diet modification, Medical device, Medication therapy, Routine screening Endocrine Self-Management Outcome: 4 (good)  Gastrointestinal Gastrointestinal Symptoms Reported: No symptoms reported Nutrition Risk Screen (CP): No indicators present  Genitourinary Genitourinary Symptoms Reported: No symptoms reported  Integumentary Integumentary Symptoms Reported: No symptoms reported Skin Management Strategies: Routine screening  Musculoskeletal Musculoskelatal Symptoms Reviewed: Unsteady  gait Additional Musculoskeletal Details: Reports unsteady gait due to Parkinson's Disease Musculoskeletal Conditions: Unsteady gait (Chronic Knee pain. Currently being managed by [facility]) Musculoskeletal  Management Strategies: Coping strategies, Medical device, Medication therapy, Routine screening Musculoskeletal Self-Management Outcome: 4 (good) Falls in the past year?: No Number of falls in past year: 1 or less Was there an injury with Fall?: No Fall Risk Category Calculator: 0 Patient Fall Risk Level: Low Fall Risk Patient at Risk for Falls Due to: Impaired balance/gait, Medication side effect, Other (Comment) (Parkinson's Disease) Fall risk Follow up: Falls prevention discussed  Psychosocial Psychosocial Symptoms Reported: No symptoms reported Behavioral Management Strategies: Support system Behavioral Health Self-Management Outcome: 5 (very good) Major Change/Loss/Stressor/Fears (CP): Denies Techniques to Cope with Loss/Stress/Change: Diversional activities Quality of Family Relationships: supportive Do you feel physically threatened by others?: No      11/28/2023   11:30 AM  Depression screen PHQ 2/9  Decreased Interest 0  Down, Depressed, Hopeless 0  PHQ - 2 Score 0    Vitals:   11/28/23 1115  BP: 136/80    Medications Reviewed Today     Reviewed by Roxie Cord, RN (Registered Nurse) on 11/28/23 at 1110  Med List Status: <None>   Medication Order Taking? Sig Documenting Provider Last Dose Status Informant  ACCU-CHEK GUIDE TEST test strip [id number]  USE AS INSTRUCTED Alto Atta, NP  Active   Accu-Chek Softclix Lancets lancets [id number]  USE TO CHECK BLOOD SUGAR TWICE A DAY AND AS NEEDED Nafziger, Randel Buss, NP  Active   acetaminophen  (TYLENOL ) 650 MG CR tablet [id number] Yes Take 1,300 mg by mouth 2 (two) times daily. [provider] Taking Active Spouse/Significant Other  albuterol  (VENTOLIN  HFA) 108 (90 Base) MCG/ACT inhaler [id number] Yes INHALE  2 PUFFS INTO THE LUNGS EVERY 6 HOURS AS NEEDED FOR WHEEZING OR SHORTNESS OF BREATH. Nafziger, Randel Buss, NP Taking Active   Ascorbic Acid  (VITAMIN C ) 1000 MG tablet [id number] Yes Take 1,000 mg by mouth daily. [provider] Taking Active Spouse/Significant Other  aspirin  81 MG chewable tablet [id number] Yes Chew 1 tablet (81 mg total) by mouth daily. Hugh Madura, MD Taking Active   atorvastatin  (LIPITOR) 80 MG tablet [id number] Yes TAKE 1 TABLET EVERY DAY Skains, Myrtle Atta, MD Taking Active   calcium  carbonate (OS-CAL) 600 MG TABS [id number]  Take 600 mg by mouth 2 (two) times daily with a meal. [provider]  Active Spouse/Significant Other  carbidopa -levodopa  (SINEMET  CR) 50-200 MG tablet [id number]  Take 1 tablet by mouth at bedtime. [provider]  Active Spouse/Significant Other  carbidopa -levodopa  (SINEMET  IR) 25-100 MG tablet [id number]  Take 1 tablet by mouth 4 (four) times daily. [provider]  Active Spouse/Significant Other  cetirizine (ZYRTEC) 10 MG tablet [id number]  Take 10 mg by mouth daily.  [provider]  Active Spouse/Significant Other  cholecalciferol  (VITAMIN D ) 25 MCG (1000 UNIT) tablet [id number]  Take 1,000 Units by mouth every evening. [provider]  Active Spouse/Significant Other  fluticasone  (FLONASE ) 50 MCG/ACT nasal spray [id number]  Place 2 sprays into both nostrils daily.  Patient taking differently: Place 2 sprays into both nostrils daily as needed for allergies or rhinitis.   Alto Atta, NP  Active Spouse/Significant Other           Med Note Dessa Floss, Jaquasha Carnevale N   Mon Nov 28, 2023 11:07 AM) Reports taking as needed  furosemide  (LASIX ) 40 MG tablet [id number] Yes Take 1 tablet (40 mg total) by mouth daily. Nafziger, Randel Buss, NP Taking Active   isosorbide  mononitrate (IMDUR ) 30 MG 24 hr tablet [id number] Yes TAKE 1/2 TABLET EVERY DAY Hugh Madura, MD Taking Active  melatonin 5 MG TABS [id number]  Take 5 mg by mouth at  bedtime. [provider]  Active Spouse/Significant Other           Med Note Jeraline Moment Nov 28, 2023 11:08 AM) Reports taking 10 mg at bedtime  metFORMIN  (GLUCOPHAGE ) 500 MG tablet [id number] Yes TAKE 1/2 TABLET EVERY DAY WITH BREAKFAST Nafziger, Randel Buss, NP Taking Active   metoprolol  succinate (TOPROL -XL) 50 MG 24 hr tablet [id number] Yes TAKE 1 TABLET (50 MG TOTAL) BY MOUTH DAILY. TAKE WITH OR IMMEDIATELY FOLLOWING A MEAL. Hugh Madura, MD Taking Active   Misc Natural Products [facility] ADVANCED) CAPS [id number] Yes Take 2 capsules by mouth daily.  [provider] Taking Active Spouse/Significant Other  Multiple Vitamin (MULTIVITAMIN) tablet [id number] Yes Take 1 tablet by mouth daily.   [provider] Taking Active Spouse/Significant Other  nitroGLYCERIN  (NITROSTAT ) 0.4 MG SL tablet [id number] Yes PLACE 1 TABLET (0.4 MG TOTAL) UNDER THE TONGUE EVERY 5 (FIVE) MINUTES AS NEEDED FOR CHEST PAIN Hugh Madura, MD Taking Active   potassium chloride  (KLOR-CON ) 10 MEQ tablet [id number] Yes Take 1 tablet (10 mEq total) by mouth daily. Alto Atta, NP Taking Active   Probiotic Product (PHILLIPS COLON HEALTH PO) [id number]  Take 1 capsule by mouth daily. [provider]  Active Spouse/Significant Other  vitamin B-12 (CYANOCOBALAMIN ) 1000 MCG tablet [id number] Yes Take 1,000 mcg by mouth daily. [provider] Taking Active Spouse/Significant Other  warfarin (COUMADIN ) 10 MG tablet [id number] Yes TAKE 1/2 TO 1 TABLET DAILY OR AS DIRECTED BY COUMADIN  CLINIC Hugh Madura, MD Taking Active             Recommendation:   PCP Follow-up on December 22, 2023 as scheduled  Follow Up Plan:   Telephone follow up appointment with Nurse Case Manager on January 06 2024   [facility] Population [facility] RN Care Manager Direct Dial: [phone]  Fax: [phone] Website: [email or web address]

## 2023-11-29 DIAGNOSIS — L821 Other seborrheic keratosis: Secondary | ICD-10-CM | POA: Diagnosis not present

## 2023-11-29 DIAGNOSIS — D225 Melanocytic nevi of trunk: Secondary | ICD-10-CM | POA: Diagnosis not present

## 2023-11-29 DIAGNOSIS — L570 Actinic keratosis: Secondary | ICD-10-CM | POA: Diagnosis not present

## 2023-11-29 DIAGNOSIS — D692 Other nonthrombocytopenic purpura: Secondary | ICD-10-CM | POA: Diagnosis not present

## 2023-11-29 DIAGNOSIS — L720 Epidermal cyst: Secondary | ICD-10-CM | POA: Diagnosis not present

## 2023-11-29 DIAGNOSIS — Z85828 Personal history of other malignant neoplasm of skin: Secondary | ICD-10-CM | POA: Diagnosis not present

## 2023-12-02 ENCOUNTER — Other Ambulatory Visit: Payer: Self-pay | Admitting: Adult Health

## 2023-12-02 ENCOUNTER — Other Ambulatory Visit: Payer: Self-pay

## 2023-12-13 ENCOUNTER — Ambulatory Visit: Attending: Cardiology

## 2023-12-13 DIAGNOSIS — Z952 Presence of prosthetic heart valve: Secondary | ICD-10-CM

## 2023-12-13 DIAGNOSIS — Z5181 Encounter for therapeutic drug level monitoring: Secondary | ICD-10-CM | POA: Diagnosis not present

## 2023-12-13 DIAGNOSIS — Z09 Encounter for follow-up examination after completed treatment for conditions other than malignant neoplasm: Secondary | ICD-10-CM

## 2023-12-13 LAB — POCT INR: INR: 2.5 (ref 2.0–3.0)

## 2023-12-13 NOTE — Progress Notes (Signed)
 Remote pacemaker transmission.

## 2023-12-13 NOTE — Patient Instructions (Signed)
 Continue taking Warfarin 1 tablet daily except for 1/2 tablet on Tuesdays and Thursdays. Remain consistent with green leafy veggies. Recheck INR in 6 weeks.  Coumadin  Clinic [phone] or [phone]

## 2023-12-22 ENCOUNTER — Encounter: Payer: Self-pay | Admitting: Adult Health

## 2023-12-22 ENCOUNTER — Ambulatory Visit: Payer: Medicare HMO | Admitting: Adult Health

## 2023-12-22 VITALS — BP 120/66 | HR 82 | Temp 97.7°F | Ht 66.75 in | Wt 182.0 lb

## 2023-12-22 DIAGNOSIS — I10 Essential (primary) hypertension: Secondary | ICD-10-CM

## 2023-12-22 DIAGNOSIS — Z7984 Long term (current) use of oral hypoglycemic drugs: Secondary | ICD-10-CM

## 2023-12-22 DIAGNOSIS — E119 Type 2 diabetes mellitus without complications: Secondary | ICD-10-CM

## 2023-12-22 LAB — POCT GLYCOSYLATED HEMOGLOBIN (HGB A1C): Hemoglobin A1C: 5.8 % — AB (ref 4.0–5.6)

## 2023-12-22 NOTE — Progress Notes (Signed)
 Subjective:    Patient ID: [name], male    DOB: 10/07/1945, 78 y.o.   MRN: [id number]  HPI  78 year old male who  has a past medical history of Acute renal failure (HCC) (02/09/2013), Aortic stenosis (08/25/2010), Arthritis, Asthma, AV BLOCK, COMPLETE, CAD (coronary artery disease), Carotid artery disease (HCC), Diabetes mellitus without complication (HCC), DIVERTICULOSIS, COLON (10/17/2007), Ejection fraction, GERD (gastroesophageal reflux disease), GOUT (01/16/2007), Headache, HEMORRHOIDS, INTERNAL (11/01/2008), adenomatous polyp of colon (02/2019), HYPERLIPIDEMIA (01/16/2007), HYPERTENSION (01/16/2007), Impaired glucose tolerance, LEG EDEMA (11/27/2008), Special screening for malignant neoplasm of prostate, and Warfarin anticoagulation.  He presents to the office today for follow up regarding HTN and DM  Diabetes mellitus type 2-managed with metformin  250 mg daily.  Does monitor his blood sugars periodically with readings in the 90's.  He denies symptoms of hypoglycemia. Lab Results  Component Value Date   HGBA1C 6.5 06/23/2023   HGBA1C 6.1 01/20/2023   HGBA1C 6.4 (H) 05/25/2022    Hypertension - managed with losartan  100 mg daily, Lasix  40 mg daily, and Toprol  100 mg daily.  He denies dizziness, lightheadedness, chest pain, or shortness of breath BP Readings from Last 3 Encounters:  12/22/23 120/66  11/28/23 136/80  07/26/23 120/70    Review of Systems See HPI   Past Medical History:  Diagnosis Date   Acute renal failure (HCC) 02/09/2013   Aortic stenosis 08/25/2010   a. Bentall aortic root replacement with a St. Jude mechanical valve and Hemashield conduit 02/2004.   Arthritis    Asthma    AV BLOCK, COMPLETE    a. s/p St Jude dual chamber pacemaker 02/2004.   CAD (coronary artery disease)    a. s/p CABGx2 (LIMA-dLAD, SVG-Cx). b. Low risk nuc 12/2012 without ischemia, EF 46% mild apical hypokinesia (EF 55% inf HK by echo).   Carotid artery disease (HCC)    a. 0-39% bilateral ICA  stenosis, stable mild hard plaque in carotid bulbs. F/u 03/2014 recommended.   Diabetes mellitus without complication (HCC)    type 2    DIVERTICULOSIS, COLON 10/17/2007   Ejection fraction    a. EF 55% with inf HK, mild MR by echo 12/2012.   GERD (gastroesophageal reflux disease)    hxof    GOUT 01/16/2007   Headache    hx of migraines    HEMORRHOIDS, INTERNAL 11/01/2008   Hx of adenomatous polyp of colon 02/2019   no recall needed   HYPERLIPIDEMIA 01/16/2007   HYPERTENSION 01/16/2007   Impaired glucose tolerance    LEG EDEMA 11/27/2008   Special screening for malignant neoplasm of prostate    Warfarin anticoagulation    AVR    Social History   Socioeconomic History   Marital status: Married    Spouse name: Not on file   Number of children: 3   Years of education: Not on file   Highest education level: 12th grade  Occupational History   Occupation: retired  Tobacco Use   Smoking status: Former    Current packs/day: 0.00    Types: Cigarettes    Quit date: 07/12/1973    Years since quitting: 50.4   Smokeless tobacco: Never  Vaping Use   Vaping status: Never Used  Substance and Sexual Activity   Alcohol use: No   Drug use: No   Sexual activity: Not on file  Other Topics Concern   Not on file  Social History Narrative   The patient is married and retired   He reports 3  grown children   Former smoker, no alcohol or tobacco or drug use at this time   Social Drivers of [employer]: Low Risk  (12/21/2023)   Overall Financial Resource Strain (CARDIA)    Difficulty of Paying Living Expenses: Not hard at all  Food Insecurity: No Food Insecurity (12/21/2023)   Hunger Vital Sign    Worried About Running Out of Food in the Last Year: Never true    Ran Out of Food in the Last Year: Never true  Transportation Needs: No Transportation Needs (12/21/2023)   PRAPARE - Administrator, Civil Service (Medical): No    Lack of Transportation (Non-Medical): No   Physical Activity: Inactive (12/21/2023)   Exercise Vital Sign    Days of Exercise per Week: 0 days    Minutes of Exercise per Session: 0 min  Stress: No Stress Concern Present (12/21/2023)   [employer] of Occupational Health - Occupational Stress Questionnaire    Feeling of Stress : Not at all  Social Connections: Moderately Isolated (12/21/2023)   Social Connection and Isolation Panel    Frequency of Communication with Friends and Family: More than three times a week    Frequency of Social Gatherings with Friends and Family: Once a week    Attends Religious Services: Never    Database administrator or Organizations: No    Attends Banker Meetings: Never    Marital Status: Married  Catering manager Violence: Not At Risk (11/28/2023)   Humiliation, Afraid, Rape, and Kick questionnaire    Fear of Current or Ex-Partner: No    Emotionally Abused: No    Physically Abused: No    Sexually Abused: No    Past Surgical History:  Procedure Laterality Date   AORTIC VALVE REPLACEMENT     CARDIAC CATHETERIZATION  06/2003   CIRCUMCISION     COLONOSCOPY     CORONARY ARTERY BYPASS GRAFT     CORONARY ATHERECTOMY N/A 05/28/2022   Procedure: CORONARY ATHERECTOMY;  Surgeon: Swaziland, Peter M, MD;  Location: [facility] INVASIVE CV LAB;  Service: Cardiovascular;  Laterality: N/A;   CORONARY LITHOTRIPSY N/A 05/28/2022   Procedure: CORONARY LITHOTRIPSY;  Surgeon: Swaziland, Peter M, MD;  Location: [facility] INVASIVE CV LAB;  Service: Cardiovascular;  Laterality: N/A;   CORONARY STENT INTERVENTION N/A 05/28/2022   Procedure: CORONARY STENT INTERVENTION;  Surgeon: Swaziland, Peter M, MD;  Location: [facility] INVASIVE CV LAB;  Service: Cardiovascular;  Laterality: N/A;   CORONARY STENT INTERVENTION N/A 01/03/2023   Procedure: CORONARY STENT INTERVENTION;  Surgeon: Swaziland, Peter M, MD;  Location: [facility] INVASIVE CV LAB;  Service: Cardiovascular;  Laterality: N/A;   coronary stents      prior to bypass    EP IMPLANTABLE  DEVICE N/A 05/06/2016   Procedure: PPM Generator Changeout;  Surgeon: Tammie Fall, MD;  Location: [facility] INVASIVE CV LAB;  Service: Cardiovascular;  Laterality: N/A;   ESOPHAGOGASTRODUODENOSCOPY     HERNIA REPAIR     INGUINAL HERNIA REPAIR Bilateral 02/07/2013   Procedure: LAPAROSCOPIC BILATERAL INGUINAL HERNIA REPAIR;  Surgeon: Kari Otto. Eli Grizzle, MD;  Location: [facility] ORS;  Service: General;  Laterality: Bilateral;   INSERTION OF MESH Bilateral 02/07/2013   Procedure: INSERTION OF MESH;  Surgeon: Kari Otto. Eli Grizzle, MD;  Location: [facility] ORS;  Service: General;  Laterality: Bilateral;   LEFT HEART CATH AND CORS/GRAFTS ANGIOGRAPHY N/A 05/27/2022   Procedure: LEFT HEART CATH AND CORS/GRAFTS ANGIOGRAPHY;  Surgeon: Lucendia Rusk, MD;  Location: [facility]  INVASIVE CV LAB;  Service: Cardiovascular;  Laterality: N/A;   LEFT HEART CATH AND CORS/GRAFTS ANGIOGRAPHY N/A 01/03/2023   Procedure: LEFT HEART CATH AND CORS/GRAFTS ANGIOGRAPHY;  Surgeon: Swaziland, Peter M, MD;  Location: [facility] INVASIVE CV LAB;  Service: Cardiovascular;  Laterality: N/A;   LUMBAR LAMINECTOMY/DECOMPRESSION MICRODISCECTOMY N/A 03/15/2018   Procedure: Complete decompressive lumbar laminectomy for spinal stenosis of L4-L5 and foraminotomy for L4-L5 root on right;  Surgeon: Hazle Lites, MD;  Location: [facility] ORS;  Service: Orthopedics;  Laterality: N/A;   PACEMAKER INSERTION     SHOULDER SURGERY Bilateral    SKIN BIOPSY  05/04/2019   BASAL CELL CARCINOMA//MID CENTRAL NASAL   SKIN BIOPSY  10/11/2019   Superficial basal cell carcinoma - left superior central forehead shave    Family History  Problem Relation Age of Onset   Heart attack Father    Kidney cancer Father    Hypertension Mother    Diabetes Mother    Diabetes Brother    Epilepsy Sister    Diabetes Sister    Diabetes Brother    Colon cancer Neg Hx    Rectal cancer Neg Hx    Stomach cancer Neg Hx    Esophageal cancer Neg Hx     Allergies  Allergen Reactions   Bee Venom Shortness Of  Breath and Swelling   Codeine Phosphate Hives, Shortness Of Breath, Itching, Rash and Other (See Comments)    Tolerated multiple doses of IV morphine    Metoprolol  Tartrate Other (See Comments)    loss of vision. Can take the XL tablet not regular    Pramipexole  Other (See Comments)    Hallucinations     Tramadol      Pt has hallucinations    Ramipril  Rash and Cough    Current Outpatient Medications on File Prior to Visit  Medication Sig Dispense Refill   ACCU-CHEK GUIDE TEST test strip USE AS INSTRUCTED 300 strip 3   Accu-Chek Softclix Lancets lancets USE TO CHECK BLOOD SUGAR TWICE A DAY AND AS NEEDED 300 each 3   acetaminophen  (TYLENOL ) 650 MG CR tablet Take 1,300 mg by mouth 2 (two) times daily.     albuterol  (VENTOLIN  HFA) 108 (90 Base) MCG/ACT inhaler INHALE 2 PUFFS INTO THE LUNGS EVERY 6 HOURS AS NEEDED FOR WHEEZING OR SHORTNESS OF BREATH. 3 each 0   Ascorbic Acid  (VITAMIN C ) 1000 MG tablet Take 1,000 mg by mouth daily.     aspirin  81 MG chewable tablet Chew 1 tablet (81 mg total) by mouth daily.     atorvastatin  (LIPITOR) 80 MG tablet TAKE 1 TABLET EVERY DAY 90 tablet 3   calcium  carbonate (OS-CAL) 600 MG TABS Take 600 mg by mouth 2 (two) times daily with a meal.     carbidopa -levodopa  (SINEMET  CR) 50-200 MG tablet Take 1 tablet by mouth at bedtime.     carbidopa -levodopa  (SINEMET  IR) 25-100 MG tablet Take 1 tablet by mouth 4 (four) times daily.     cetirizine (ZYRTEC) 10 MG tablet Take 10 mg by mouth daily.      cholecalciferol  (VITAMIN D ) 25 MCG (1000 UNIT) tablet Take 1,000 Units by mouth every evening.     fluticasone  (FLONASE ) 50 MCG/ACT nasal spray Place 2 sprays into both nostrils daily. 16 g 6   furosemide  (LASIX ) 40 MG tablet Take 1 tablet (40 mg total) by mouth daily. 90 tablet 3   isosorbide  mononitrate (IMDUR ) 30 MG 24 hr tablet TAKE 1/2 TABLET EVERY DAY 45 tablet 3   melatonin  5 MG TABS Take 5 mg by mouth at bedtime.     metFORMIN  (GLUCOPHAGE ) 500 MG tablet TAKE 1/2  TABLET EVERY DAY WITH BREAKFAST 45 tablet 3   metoprolol  succinate (TOPROL -XL) 50 MG 24 hr tablet TAKE 1 TABLET (50 MG TOTAL) BY MOUTH DAILY. TAKE WITH OR IMMEDIATELY FOLLOWING A MEAL. 90 tablet 1   Misc Natural Products (TART CHERRY ADVANCED) CAPS Take 2 capsules by mouth daily.      Multiple Vitamin (MULTIVITAMIN) tablet Take 1 tablet by mouth daily.       nitroGLYCERIN  (NITROSTAT ) 0.4 MG SL tablet PLACE 1 TABLET (0.4 MG TOTAL) UNDER THE TONGUE EVERY 5 (FIVE) MINUTES AS NEEDED FOR CHEST PAIN 75 tablet 2   potassium chloride  (KLOR-CON ) 10 MEQ tablet Take 1 tablet (10 mEq total) by mouth daily. 90 tablet 3   Probiotic Product (PHILLIPS COLON HEALTH PO) Take 1 capsule by mouth daily.     vitamin B-12 (CYANOCOBALAMIN ) 1000 MCG tablet Take 1,000 mcg by mouth daily.     warfarin (COUMADIN ) 10 MG tablet TAKE 1/2 TO 1 TABLET DAILY OR AS DIRECTED BY COUMADIN  CLINIC 90 tablet 3   No current facility-administered medications on file prior to visit.    BP 120/66 (BP Location: Left Arm, Patient Position: Sitting)   Pulse 82   Temp 97.7 F (36.5 C) (Oral)   Ht 5' 6.75 (1.695 m)   Wt 182 lb (82.6 kg)   SpO2 98%   BMI 28.72 kg/m       Objective:   Physical Exam Vitals and nursing note reviewed.  Constitutional:      Appearance: Normal appearance. He is obese.   Cardiovascular:     Rate and Rhythm: Normal rate and regular rhythm.     Pulses: Normal pulses.     Heart sounds: Normal heart sounds.  Pulmonary:     Effort: Pulmonary effort is normal.     Breath sounds: Normal breath sounds.   Skin:    General: Skin is warm and dry.   Neurological:     General: No focal deficit present.     Mental Status: He is alert and oriented to person, place, and time.   Psychiatric:        Mood and Affect: Mood normal.        Behavior: Behavior normal.        Thought Content: Thought content normal.        Judgment: Judgment normal.        Assessment & Plan:  1. Diabetes mellitus treated  with oral medication (HCC) (Primary)  - POC HgB A1c- 5.8  - Will have him stop Metformin  250 mg daily.  - Follow up in 3 months   2. Primary hypertension - Well controlled.  - No change in medication   Alto Atta, NP

## 2023-12-22 NOTE — Patient Instructions (Addendum)
 Your A1c was 5.8 - lets try coming off the Metformin  for three months

## 2023-12-25 ENCOUNTER — Encounter (INDEPENDENT_AMBULATORY_CARE_PROVIDER_SITE_OTHER): Payer: Self-pay | Admitting: Cardiology

## 2023-12-25 DIAGNOSIS — I25718 Atherosclerosis of autologous vein coronary artery bypass graft(s) with other forms of angina pectoris: Secondary | ICD-10-CM | POA: Diagnosis not present

## 2023-12-26 ENCOUNTER — Other Ambulatory Visit (HOSPITAL_COMMUNITY): Payer: Self-pay

## 2023-12-26 MED ORDER — ISOSORBIDE MONONITRATE ER 30 MG PO TB24
30.0000 mg | ORAL_TABLET | Freq: Every day | ORAL | 3 refills | Status: DC
  Filled 2023-12-26: qty 90, 90d supply, fill #0

## 2023-12-26 NOTE — Telephone Encounter (Signed)
 Please see the MyChart message reply(ies) for my assessment and plan.    Sorry to hear that he is having more chest pain symptoms.  I reviewed his last cardiac catheterization and stents placed one year ago.   Let's try to increase his Imdur  or isosorbide  to a full tablet or 30 mg once a day. This is an antianginal medication and can help with chest pain.   This patient gave consent for this Medical Advice Message and is aware that it may result in a bill to [employer], as well as the possibility of receiving a bill for a co-payment or deductible. They are an established patient, but are not seeking medical advice exclusively about a problem treated during an in person or video visit in the last seven days. I did not recommend an in person or video visit within seven days of my reply.    I spent a total of 8 minutes cumulative time within 7 days through [employer].  Dorothye Gathers, MD

## 2023-12-26 NOTE — Telephone Encounter (Signed)
 Needed ntg Sun, Sat and 3 days last week for chest pain.  One ntg per episode has worked for the remainder of the day.  No other symptoms.  He puts a heating pad on chest and that may help sometimes.  Occurs during rest as well as activity like showering.  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  She does not recall any low readings.   Adv that we will forward to Dr. Renna Cary and his nurse and we will call her back w his recommendations.

## 2024-01-06 ENCOUNTER — Other Ambulatory Visit: Payer: Self-pay

## 2024-01-06 NOTE — Patient Outreach (Unsigned)
 Complex Care Management   Visit Note  01/06/2024  Name:  [name] MRN: [id number] DOB: [birth year]  Situation: Referral received for Complex Care Management related to Heart Failure, Hypertension, HLD and Diabetes. I obtained verbal consent from Patient.  Visit completed with [name] and his spouse via telephone today.  Background:   Past Medical History:  Diagnosis Date   Acute renal failure (HCC) 02/09/2013   Aortic stenosis 08/25/2010   a. Bentall aortic root replacement with a St. Jude mechanical valve and Hemashield conduit 02/2004.   Arthritis    Asthma    AV BLOCK, COMPLETE    a. s/p St Jude dual chamber pacemaker 02/2004.   CAD (coronary artery disease)    a. s/p CABGx2 (LIMA-dLAD, SVG-Cx). b. Low risk nuc 12/2012 without ischemia, EF 46% mild apical hypokinesia (EF 55% inf HK by echo).   Carotid artery disease (HCC)    a. 0-39% bilateral ICA stenosis, stable mild hard plaque in carotid bulbs. F/u 03/2014 recommended.   Diabetes mellitus without complication (HCC)    type 2    DIVERTICULOSIS, COLON 10/17/2007   Ejection fraction    a. EF 55% with inf HK, mild MR by echo 12/2012.   GERD (gastroesophageal reflux disease)    hxof    GOUT 01/16/2007   Headache    hx of migraines    HEMORRHOIDS, INTERNAL 11/01/2008   Hx of adenomatous polyp of colon 02/2019   no recall needed   HYPERLIPIDEMIA 01/16/2007   HYPERTENSION 01/16/2007   Impaired glucose tolerance    LEG EDEMA 11/27/2008   Special screening for malignant neoplasm of prostate    Warfarin anticoagulation    AVR    Assessment: Patient Reported Symptoms: Cognitive Cognitive Status: Alert and oriented to person, place, and time, Normal speech and language skills  Neurological Neurological Review of Symptoms: No symptoms reported  HEENT HEENT Symptoms Reported: No symptoms reported  Cardiovascular Cardiovascular Symptoms Reported: No symptoms reported Does patient have uncontrolled Hypertension?: No Weight: 177 lb  (80.3 kg)  Respiratory Respiratory Symptoms Reported: No symptoms reported  Endocrine Patient reports the following symptoms related to hypoglycemia or hyperglycemia : No symptoms reported Is patient diabetic?: Yes Is patient checking blood sugars at home?: Yes  Gastrointestinal Gastrointestinal Symptoms Reported: No symptoms reported  Genitourinary Genitourinary Symptoms Reported: No symptoms reported  Integumentary Integumentary Symptoms Reported: No symptoms reported  Musculoskeletal Additional Musculoskeletal Details: Reports unsteady gait due to Parkinson's Disease  Psychosocial Psychosocial Symptoms Reported: No symptoms reported Quality of Family Relationships: supportive Do you feel physically threatened by others?: No      01/06/2024   11:58 AM  Depression screen PHQ 2/9  Decreased Interest 0  Down, Depressed, Hopeless 0  PHQ - 2 Score 0    Vitals:   01/06/24 1137  BP: 123/78    Medications Reviewed Today     Reviewed by Karoline Lima, RN (Registered Nurse) on 01/06/24 at 1144  Med List Status: <None>   Medication Order Taking? Sig Documenting Provider Last Dose Status Informant  ACCU-CHEK GUIDE TEST test strip [id number]  USE AS INSTRUCTED Nafziger, Darleene, NP  Active   Accu-Chek Softclix Lancets lancets [id number]  USE TO CHECK BLOOD SUGAR TWICE A DAY AND AS NEEDED Nafziger, Darleene, NP  Active   acetaminophen  (TYLENOL ) 650 MG CR tablet [id number]  Take 1,300 mg by mouth 2 (two) times daily. [provider]  Active Spouse/Significant Other  albuterol  (VENTOLIN  HFA) 108 (90 Base) MCG/ACT inhaler [id number]  INHALE 2  PUFFS INTO THE LUNGS EVERY 6 HOURS AS NEEDED FOR WHEEZING OR SHORTNESS OF BREATH. Nafziger, Darleene, NP  Active   Ascorbic Acid  (VITAMIN C ) 1000 MG tablet [id number]  Take 1,000 mg by mouth daily. [provider]  Active Spouse/Significant Other  aspirin  81 MG chewable tablet [id number]  Chew 1 tablet (81 mg total) by mouth daily. Jeffrie Oneil BROCKS, MD   Active   atorvastatin  (LIPITOR) 80 MG tablet [id number]  TAKE 1 TABLET EVERY DAY Jeffrie Oneil BROCKS, MD  Active   calcium  carbonate (OS-CAL) 600 MG TABS [id number]  Take 600 mg by mouth 2 (two) times daily with a meal. [provider]  Active Spouse/Significant Other  carbidopa -levodopa  (SINEMET  CR) 50-200 MG tablet [id number]  Take 1 tablet by mouth at bedtime. [provider]  Active Spouse/Significant Other  carbidopa -levodopa  (SINEMET  IR) 25-100 MG tablet [id number]  Take 1 tablet by mouth 4 (four) times daily. [provider]  Active Spouse/Significant Other  cetirizine (ZYRTEC) 10 MG tablet [id number]  Take 10 mg by mouth daily.  [provider]  Active Spouse/Significant Other  cholecalciferol  (VITAMIN D ) 25 MCG (1000 UNIT) tablet [id number]  Take 1,000 Units by mouth every evening. [provider]  Active Spouse/Significant Other  fluticasone  (FLONASE ) 50 MCG/ACT nasal spray [id number]  Place 2 sprays into both nostrils daily. Merna Darleene, NP  Active Spouse/Significant Other           Med Note HALLIE, Javonni Macke N   Mon Nov 28, 2023 11:07 AM) Reports taking as needed  furosemide  (LASIX ) 40 MG tablet [id number]  Take 1 tablet (40 mg total) by mouth daily. Nafziger, Darleene, NP  Active   isosorbide  mononitrate (IMDUR ) 30 MG 24 hr tablet [id number]  Take 1 tablet (30 mg total) by mouth daily. Jeffrie Oneil BROCKS, MD  Active   melatonin 5 MG TABS [id number]  Take 5 mg by mouth at bedtime. [provider]  Active Spouse/Significant Other           Med Note HALLIE JACKSON LOISE Pablo Nov 28, 2023 11:08 AM) Reports taking 10 mg at bedtime  metFORMIN  (GLUCOPHAGE ) 500 MG tablet [id number]  TAKE 1/2 TABLET EVERY DAY WITH BREAKFAST Nafziger, Darleene, NP  Active   metoprolol  succinate (TOPROL -XL) 50 MG 24 hr tablet [id number]  TAKE 1 TABLET (50 MG TOTAL) BY MOUTH DAILY. TAKE WITH OR IMMEDIATELY FOLLOWING A MEAL. Jeffrie Oneil BROCKS, MD  Active   Misc Natural Products [facility]  ADVANCED) CAPS [id number]  Take 2 capsules by mouth daily.  [provider]  Active Spouse/Significant Other  Multiple Vitamin (MULTIVITAMIN) tablet [id number]  Take 1 tablet by mouth daily.   [provider]  Active Spouse/Significant Other  nitroGLYCERIN  (NITROSTAT ) 0.4 MG SL tablet [id number]  PLACE 1 TABLET (0.4 MG TOTAL) UNDER THE TONGUE EVERY 5 (FIVE) MINUTES AS NEEDED FOR CHEST PAIN Jeffrie Oneil BROCKS, MD  Active   potassium chloride  (KLOR-CON ) 10 MEQ tablet [id number]  Take 1 tablet (10 mEq total) by mouth daily. Merna Darleene, NP  Active   Probiotic Product (PHILLIPS COLON HEALTH PO) [id number]  Take 1 capsule by mouth daily. [provider]  Active Spouse/Significant Other  vitamin B-12 (CYANOCOBALAMIN ) 1000 MCG tablet [id number]  Take 1,000 mcg by mouth daily. [provider]  Active Spouse/Significant Other  warfarin (COUMADIN ) 10 MG tablet [id number]  TAKE 1/2 TO 1 TABLET DAILY OR AS DIRECTED BY COUMADIN  CLINIC Jeffrie Oneil BROCKS, MD  Active  Recommendation:   Continue Current Plan of Care  Follow Up Plan:   Telephone follow up appointment with Nurse Case Manager   Jackson Acron [facility] RN Care Manager Direct Dial: [phone]  Fax: [phone] Website: [email or web address]

## 2024-01-09 NOTE — Patient Instructions (Signed)
 Thank you for allowing the Complex Care Management team to participate in your care. It was great speaking with you!  Keep up the great work!!  We will follow up on February 06, 2024 at 1100. Please do not hesitate to contact me if you require assistance prior to our next outreach.    Jackson Acron [facility] Population Health RN Care Manager Direct Dial: [phone]  Fax: [phone] Website: [email or web address]

## 2024-01-24 ENCOUNTER — Telehealth: Payer: Self-pay | Admitting: *Deleted

## 2024-01-24 ENCOUNTER — Ambulatory Visit

## 2024-01-24 ENCOUNTER — Encounter: Payer: Self-pay | Admitting: Cardiology

## 2024-01-24 DIAGNOSIS — G629 Polyneuropathy, unspecified: Secondary | ICD-10-CM | POA: Diagnosis not present

## 2024-01-24 DIAGNOSIS — G20A2 Parkinson's disease without dyskinesia, with fluctuations: Secondary | ICD-10-CM | POA: Diagnosis not present

## 2024-01-24 MED ORDER — ISOSORBIDE MONONITRATE ER 30 MG PO TB24: 30.0000 mg | ORAL_TABLET | Freq: Every day | ORAL | 3 refills | Status: DC

## 2024-01-24 NOTE — Telephone Encounter (Signed)
 Spoke with wife and she states she forgot the appointment today and was able to reschedule to this Friday 7/18 at 130pm.

## 2024-01-27 ENCOUNTER — Ambulatory Visit: Attending: Cardiology

## 2024-01-27 DIAGNOSIS — Z5181 Encounter for therapeutic drug level monitoring: Secondary | ICD-10-CM | POA: Diagnosis not present

## 2024-01-27 DIAGNOSIS — Z09 Encounter for follow-up examination after completed treatment for conditions other than malignant neoplasm: Secondary | ICD-10-CM

## 2024-01-27 DIAGNOSIS — Z952 Presence of prosthetic heart valve: Secondary | ICD-10-CM | POA: Diagnosis not present

## 2024-01-27 LAB — POCT INR: INR: 2.3 (ref 2.0–3.0)

## 2024-01-27 NOTE — Progress Notes (Signed)
 Inr 2.3  Please see anticoagulation encounter

## 2024-01-27 NOTE — Patient Instructions (Signed)
 Continue taking Warfarin 1 tablet daily except for 1/2 tablet on Tuesdays and Thursdays. Remain consistent with green leafy veggies. Recheck INR in 6 weeks.  Coumadin  Clinic [phone] or [phone]

## 2024-02-02 ENCOUNTER — Ambulatory Visit: Payer: Medicare HMO

## 2024-02-02 DIAGNOSIS — I442 Atrioventricular block, complete: Secondary | ICD-10-CM

## 2024-02-03 LAB — CUP PACEART REMOTE DEVICE CHECK
Battery Remaining Longevity: 35 mo
Battery Remaining Percentage: 28 %
Battery Voltage: 2.93 V
Brady Statistic AP VP Percent: 31 %
Brady Statistic AP VS Percent: 1 %
Brady Statistic AS VP Percent: 68 %
Brady Statistic AS VS Percent: 1 %
Brady Statistic RA Percent Paced: 31 %
Brady Statistic RV Percent Paced: 99 %
Date Time Interrogation Session: 20250724020014
Implantable Lead Connection Status: 753985
Implantable Lead Connection Status: 753985
Implantable Lead Implant Date: 20050816
Implantable Lead Implant Date: 20050816
Implantable Lead Location: 753859
Implantable Lead Location: 753860
Implantable Pulse Generator Implant Date: 20171026
Lead Channel Impedance Value: 490 Ohm
Lead Channel Impedance Value: 650 Ohm
Lead Channel Pacing Threshold Amplitude: 0.5 V
Lead Channel Pacing Threshold Amplitude: 1 V
Lead Channel Pacing Threshold Pulse Width: 0.5 ms
Lead Channel Pacing Threshold Pulse Width: 0.5 ms
Lead Channel Sensing Intrinsic Amplitude: 12 mV
Lead Channel Sensing Intrinsic Amplitude: 2.9 mV
Lead Channel Setting Pacing Amplitude: 0.75 V
Lead Channel Setting Pacing Amplitude: 2 V
Lead Channel Setting Pacing Pulse Width: 0.5 ms
Lead Channel Setting Sensing Sensitivity: 4 mV
Pulse Gen Model: 2272
Pulse Gen Serial Number: 3182792

## 2024-02-06 ENCOUNTER — Ambulatory Visit: Payer: Self-pay | Admitting: Internal Medicine

## 2024-02-06 ENCOUNTER — Telehealth: Payer: Self-pay

## 2024-02-08 ENCOUNTER — Other Ambulatory Visit: Payer: Self-pay | Admitting: Cardiology

## 2024-02-09 NOTE — Telephone Encounter (Signed)
 Rx Refill

## 2024-02-14 DIAGNOSIS — M25562 Pain in left knee: Secondary | ICD-10-CM | POA: Diagnosis not present

## 2024-02-14 DIAGNOSIS — M25561 Pain in right knee: Secondary | ICD-10-CM | POA: Diagnosis not present

## 2024-02-15 ENCOUNTER — Encounter: Payer: Self-pay | Admitting: Cardiology

## 2024-02-15 NOTE — Telephone Encounter (Signed)
 Called and spoke with patient and his wife.  Mrs. Broadus states patient has been doing better overall since increasing dose of Imdur , but last night he had an episode of chest pain. He reports the chest pain woke him up and he had pain going down both of his arms, he took a nitroglycerin  with relief in about 8-10 minutes. About 2 hours later his chest began to hurt again and he took another nitroglycerin , no radiating pain this time. He denies any SOB, and denies chest pain at this time.  Mrs. Yan also states drinking warm water seemed to help relieved patient's chest pain last night. She would like to know if there is anything they need to be concerned about or need to do differently.  Advised Mr. and Mrs. Tooker on ED precautions and NTG use, both verbalized understanding.  Will forward to Dr. Jeffrie to review and advise.

## 2024-02-25 ENCOUNTER — Other Ambulatory Visit: Payer: Self-pay | Admitting: Cardiology

## 2024-02-25 MED ORDER — ISOSORBIDE MONONITRATE ER 60 MG PO TB24: 60.0000 mg | ORAL_TABLET | Freq: Every day | ORAL | 3 refills | Status: AC

## 2024-02-27 ENCOUNTER — Other Ambulatory Visit: Payer: Self-pay

## 2024-03-02 NOTE — Patient Instructions (Signed)
 Thank you for allowing the Complex Care Management team to participate in your care. It was great speaking with you.   Keep up the great work with managing your care!!  Reminders: -Please attend your PCP appointment as scheduled on March 23, 2024  We will follow up on March 30, 2024 at 1100. Please do not hesitate to contact me if you require assistance prior to our next outreach.    Jackson Acron [facility] Population Health RN Care Manager Direct Dial: [phone]  Fax: [phone] Website: [email or web address]

## 2024-03-02 NOTE — Patient Outreach (Signed)
 Complex Care Management   Visit Note    Name:  [name] MRN: [id number] DOB: 04-Jul-1946  Situation: Referral received for Complex Care Management related to CHF and Diabetes I obtained verbal consent from Patient.  Visit completed with Mr. Denio and his spouse via telephone today.  Background:   Past Medical History:  Diagnosis Date   Acute renal failure (HCC) 02/09/2013   Aortic stenosis 08/25/2010   a. Bentall aortic root replacement with a St. Jude mechanical valve and Hemashield conduit 02/2004.   Arthritis    Asthma    AV BLOCK, COMPLETE    a. s/p St Jude dual chamber pacemaker 02/2004.   CAD (coronary artery disease)    a. s/p CABGx2 (LIMA-dLAD, SVG-Cx). b. Low risk nuc 12/2012 without ischemia, EF 46% mild apical hypokinesia (EF 55% inf HK by echo).   Carotid artery disease (HCC)    a. 0-39% bilateral ICA stenosis, stable mild hard plaque in carotid bulbs. F/u 03/2014 recommended.   Diabetes mellitus without complication (HCC)    type 2    DIVERTICULOSIS, COLON 10/17/2007   Ejection fraction    a. EF 55% with inf HK, mild MR by echo 12/2012.   GERD (gastroesophageal reflux disease)    hxof    GOUT 01/16/2007   Headache    hx of migraines    HEMORRHOIDS, INTERNAL 11/01/2008   Hx of adenomatous polyp of colon 02/2019   no recall needed   HYPERLIPIDEMIA 01/16/2007   HYPERTENSION 01/16/2007   Impaired glucose tolerance    LEG EDEMA 11/27/2008   Special screening for malignant neoplasm of prostate    Warfarin anticoagulation    AVR    Assessment: Patient Reported Symptoms: Cognitive Cognitive Status: Alert and oriented to person, place, and time, Normal speech and language skills Cognitive/Intellectual Conditions Management [RPT]: None reported or documented in medical history or problem list Health Maintenance Behaviors: Hobbies, Healthy diet, Annual physical exam, Spiritual practice(s) Healing Pattern: Average Health Facilitated by: Pain control, Rest, Healthy diet   Neurological Neurological Review of Symptoms: No symptoms reported Neurological Management Strategies: Coping strategies, Medication therapy, Medical device Neurological Self-Management Outcome: 5 (very good) Neurological Comment: Reports completing follow up outreach with Neurology/  HEENT HEENT Symptoms Reported: No symptoms reported HEENT Management Strategies: Medical device, Routine screening HEENT Self-Management Outcome: 4 (good)  Cardiovascular Cardiovascular Symptoms Reported: No symptoms reported Does patient have uncontrolled Hypertension?: No Weight: 172 lb (78 kg) Cardiovascular Self-Management Outcome: 5 (very good)  Respiratory Respiratory Symptoms Reported: No symptoms reported Respiratory Self-Management Outcome: 4 (good)  Endocrine Endocrine Symptoms Reported: No symptoms reported Is patient diabetic?: Yes Is patient checking blood sugars at home?: Yes List most recent blood sugar readings, include date and time of day: Reports fasting reading today of 105 mg/dl Endocrine Self-Management Outcome: 5 (very good)  Gastrointestinal Gastrointestinal Symptoms Reported: No symptoms reported Gastrointestinal Self-Management Outcome: 4 (good) Nutrition Risk Screen (CP): No indicators present  Genitourinary Genitourinary Symptoms Reported: No symptoms reported Genitourinary Self-Management Outcome: 4 (good)  Integumentary Integumentary Symptoms Reported: No symptoms reported Skin Management Strategies: Routine screening Skin Self-Management Outcome: 4 (good)  Musculoskeletal Musculoskelatal Symptoms Reviewed: Unsteady gait Additional Musculoskeletal Details: Reports unsteady gait due to Parkinson's Disease Musculoskeletal Management Strategies: Coping strategies, Medical device, Medication therapy, Routine screening Musculoskeletal Self-Management Outcome: 4 (good)  Psychosocial Psychosocial Symptoms Reported: No symptoms reported Behavioral Health Self-Management Outcome: 5  (very good) Major Change/Loss/Stressor/Fears (CP): Denies Quality of Family Relationships: helpful, involved, supportive Do you feel physically threatened by others?:  No     There were no vitals filed for this visit.  Medications Reviewed Today     Reviewed by Karoline Lima, RN (Registered Nurse) on 02/27/24 at 1213  Med List Status: <None>   Medication Order Taking? Sig Documenting Provider Last Dose Status Informant  ACCU-CHEK GUIDE TEST test strip [id number]  USE AS INSTRUCTED Nafziger, Darleene, NP  Active   Accu-Chek Softclix Lancets lancets [id number]  USE TO CHECK BLOOD SUGAR TWICE A DAY AND AS NEEDED Nafziger, Darleene, NP  Active   acetaminophen  (TYLENOL ) 650 MG CR tablet [id number]  Take 1,300 mg by mouth 2 (two) times daily. [provider]  Active Spouse/Significant Other  albuterol  (VENTOLIN  HFA) 108 (90 Base) MCG/ACT inhaler [id number]  INHALE 2 PUFFS INTO THE LUNGS EVERY 6 HOURS AS NEEDED FOR WHEEZING OR SHORTNESS OF BREATH. Nafziger, Darleene, NP  Active   Ascorbic Acid  (VITAMIN C ) 1000 MG tablet [id number]  Take 1,000 mg by mouth daily. [provider]  Active Spouse/Significant Other  aspirin  81 MG chewable tablet [id number]  Chew 1 tablet (81 mg total) by mouth daily. Jeffrie Oneil BROCKS, MD  Active   atorvastatin  (LIPITOR) 80 MG tablet [id number]  TAKE 1 TABLET EVERY DAY Jeffrie Oneil BROCKS, MD  Active   calcium  carbonate (OS-CAL) 600 MG TABS [id number]  Take 600 mg by mouth 2 (two) times daily with a meal. [provider]  Active Spouse/Significant Other  carbidopa -levodopa  (SINEMET  CR) 50-200 MG tablet [id number]  Take 1 tablet by mouth at bedtime. [provider]  Active Spouse/Significant Other  carbidopa -levodopa  (SINEMET  IR) 25-100 MG tablet [id number]  Take 1 tablet by mouth 4 (four) times daily. [provider]  Active Spouse/Significant Other  cetirizine (ZYRTEC) 10 MG tablet [id number]  Take 10 mg by mouth daily.  [provider]  Active  Spouse/Significant Other  cholecalciferol  (VITAMIN D ) 25 MCG (1000 UNIT) tablet [id number]  Take 1,000 Units by mouth every evening. [provider]  Active Spouse/Significant Other  fluticasone  (FLONASE ) 50 MCG/ACT nasal spray [id number]  Place 2 sprays into both nostrils daily. Merna Darleene, NP  Active Spouse/Significant Other           Med Note HALLIE, Knolan Simien N   Mon Nov 28, 2023 11:07 AM) Reports taking as needed  furosemide  (LASIX ) 40 MG tablet [id number]  Take 1 tablet (40 mg total) by mouth daily. Nafziger, Darleene, NP  Active   isosorbide  mononitrate (IMDUR ) 60 MG 24 hr tablet [id number]  Take 1 tablet (60 mg total) by mouth daily. Jeffrie Oneil BROCKS, MD  Active   melatonin 5 MG TABS [id number]  Take 5 mg by mouth at bedtime. [provider]  Active Spouse/Significant Other           Med Note HALLIE LIMA LOISE Pablo Nov 28, 2023 11:08 AM) Reports taking 10 mg at bedtime  metFORMIN  (GLUCOPHAGE ) 500 MG tablet [id number]  TAKE 1/2 TABLET EVERY DAY WITH BREAKFAST  Patient not taking: Reported on 02/27/2024   Nafziger, Cory, NP  Active   metoprolol  succinate (TOPROL -XL) 50 MG 24 hr tablet [id number]  TAKE 1 TABLET (50 MG TOTAL) BY MOUTH DAILY. TAKE WITH OR IMMEDIATELY FOLLOWING A MEAL. Jeffrie Oneil BROCKS, MD  Active   Misc Natural Products [facility] ADVANCED) CAPS [id number]  Take 2 capsules by mouth daily.  [provider]  Active Spouse/Significant Other  Multiple Vitamin (MULTIVITAMIN) tablet [id number]  Take 1 tablet by mouth daily.   [provider]  Active Spouse/Significant Other  nitroGLYCERIN  (NITROSTAT ) 0.4 MG SL tablet [id number]  PLACE 1 TABLET (0.4 MG TOTAL) UNDER THE TONGUE EVERY 5 (FIVE) MINUTES AS NEEDED FOR CHEST PAIN Jeffrie Oneil BROCKS, MD  Active   potassium chloride  (KLOR-CON ) 10 MEQ tablet [id number]  Take 1 tablet (10 mEq total) by mouth daily. Merna Huxley, NP  Active   Probiotic Product (PHILLIPS COLON HEALTH PO) [id number]  Take 1 capsule by mouth daily.  [provider]  Active Spouse/Significant Other  vitamin B-12 (CYANOCOBALAMIN ) 1000 MCG tablet [id number]  Take 1,000 mcg by mouth daily. [provider]  Active Spouse/Significant Other  warfarin (COUMADIN ) 10 MG tablet [id number]  TAKE 1/2 TO 1 TABLET DAILY OR AS DIRECTED BY COUMADIN  CLINIC Jeffrie Oneil BROCKS, MD  Active             Recommendation:   PCP Follow-up on March 23, 2024 Continue Current Plan of Care  Follow Up Plan:   Telephone follow up appointment with Nurse Case Manager on March 30, 2024    Jackson Acron [facility] RN Care Manager Direct Dial: [phone]  Fax: [phone] Website: [email or web address]

## 2024-03-09 ENCOUNTER — Ambulatory Visit: Attending: Cardiology | Admitting: *Deleted

## 2024-03-09 DIAGNOSIS — Z952 Presence of prosthetic heart valve: Secondary | ICD-10-CM | POA: Diagnosis not present

## 2024-03-09 DIAGNOSIS — Z09 Encounter for follow-up examination after completed treatment for conditions other than malignant neoplasm: Secondary | ICD-10-CM

## 2024-03-09 DIAGNOSIS — Z5181 Encounter for therapeutic drug level monitoring: Secondary | ICD-10-CM

## 2024-03-09 LAB — POCT INR: INR: 2.1 (ref 2.0–3.0)

## 2024-03-09 NOTE — Progress Notes (Addendum)
 Description   INR-2.1;Continue taking Warfarin 1 tablet daily except for 1/2 tablet on Tuesdays and Thursdays. Remain consistent with green leafy veggies. Recheck INR in 6 weeks.  Coumadin  Clinic [phone]

## 2024-03-09 NOTE — Patient Instructions (Addendum)
 Description   INR-2.1;Continue taking Warfarin 1 tablet daily except for 1/2 tablet on Tuesdays and Thursdays. Remain consistent with green leafy veggies. Recheck INR in 6 weeks.  Coumadin  Clinic [phone]

## 2024-03-23 ENCOUNTER — Ambulatory Visit: Admitting: Adult Health

## 2024-03-23 ENCOUNTER — Encounter: Payer: Self-pay | Admitting: Adult Health

## 2024-03-23 VITALS — BP 136/80 | HR 73 | Temp 98.1°F | Ht 66.75 in | Wt 180.0 lb

## 2024-03-23 DIAGNOSIS — E119 Type 2 diabetes mellitus without complications: Secondary | ICD-10-CM

## 2024-03-23 DIAGNOSIS — Z23 Encounter for immunization: Secondary | ICD-10-CM

## 2024-03-23 DIAGNOSIS — I10 Essential (primary) hypertension: Secondary | ICD-10-CM

## 2024-03-23 DIAGNOSIS — Z7984 Long term (current) use of oral hypoglycemic drugs: Secondary | ICD-10-CM

## 2024-03-23 LAB — POCT GLYCOSYLATED HEMOGLOBIN (HGB A1C): Hemoglobin A1C: 6 % — AB (ref 4.0–5.6)

## 2024-03-23 NOTE — Patient Instructions (Addendum)
 Your A1c was 6.0   Please follow up with me after Dec 12th for your annual exam.

## 2024-03-23 NOTE — Progress Notes (Signed)
 Subjective:    Patient ID: [name], male    DOB: 1946/05/06, 78 y.o.   MRN: [id number]  HPI  78 year old male who  has a past medical history of Acute renal failure (HCC) (02/09/2013), Aortic stenosis (08/25/2010), Arthritis, Asthma, AV BLOCK, COMPLETE, CAD (coronary artery disease), Carotid artery disease (HCC), Diabetes mellitus without complication (HCC), DIVERTICULOSIS, COLON (10/17/2007), Ejection fraction, GERD (gastroesophageal reflux disease), GOUT (01/16/2007), Headache, HEMORRHOIDS, INTERNAL (11/01/2008), adenomatous polyp of colon (02/2019), HYPERLIPIDEMIA (01/16/2007), HYPERTENSION (01/16/2007), Impaired glucose tolerance, LEG EDEMA (11/27/2008), Special screening for malignant neoplasm of prostate, and Warfarin anticoagulation.  He presents to the office today for follow up regarding HTN and DM  Diabetes mellitus type 2-- Currently diet controlled. I had him stop his Metformin  three months ago. He has been checking his BS with readings between 80-120. He had steroid injections in his knees done in early August and his blood sugar went up to 235 for a day.  Does monitor his blood sugars periodically with readings in the 90's.  He denies symptoms of hypoglycemia. Lab Results  Component Value Date   HGBA1C 5.8 (A) 12/22/2023   HGBA1C 6.5 06/23/2023   HGBA1C 6.1 01/20/2023   Wt Readings from Last 3 Encounters:  03/23/24 180 lb (81.6 kg)  02/27/24 172 lb (78 kg)  01/06/24 177 lb (80.3 kg)   Hypertension - managed with losartan  100 mg daily, Lasix  40 mg daily, and Toprol  100 mg daily.  He denies dizziness, lightheadedness, chest pain, or shortness of breath BP Readings from Last 3 Encounters:  03/23/24 136/80  01/06/24 123/78  12/22/23 120/66    Review of Systems See HPI   Past Medical History:  Diagnosis Date   Acute renal failure (HCC) 02/09/2013   Aortic stenosis 08/25/2010   a. Bentall aortic root replacement with a St. Jude mechanical valve and Hemashield conduit 02/2004.    Arthritis    Asthma    AV BLOCK, COMPLETE    a. s/p St Jude dual chamber pacemaker 02/2004.   CAD (coronary artery disease)    a. s/p CABGx2 (LIMA-dLAD, SVG-Cx). b. Low risk nuc 12/2012 without ischemia, EF 46% mild apical hypokinesia (EF 55% inf HK by echo).   Carotid artery disease (HCC)    a. 0-39% bilateral ICA stenosis, stable mild hard plaque in carotid bulbs. F/u 03/2014 recommended.   Diabetes mellitus without complication (HCC)    type 2    DIVERTICULOSIS, COLON 10/17/2007   Ejection fraction    a. EF 55% with inf HK, mild MR by echo 12/2012.   GERD (gastroesophageal reflux disease)    hxof    GOUT 01/16/2007   Headache    hx of migraines    HEMORRHOIDS, INTERNAL 11/01/2008   Hx of adenomatous polyp of colon 02/2019   no recall needed   HYPERLIPIDEMIA 01/16/2007   HYPERTENSION 01/16/2007   Impaired glucose tolerance    LEG EDEMA 11/27/2008   Special screening for malignant neoplasm of prostate    Warfarin anticoagulation    AVR    Social History   Socioeconomic History   Marital status: Married    Spouse name: Not on file   Number of children: 3   Years of education: Not on file   Highest education level: 12th grade  Occupational History   Occupation: retired  Tobacco Use   Smoking status: Former    Current packs/day: 0.00    Types: Cigarettes    Quit date: 07/12/1973    Years since  quitting: 50.7   Smokeless tobacco: Never  Vaping Use   Vaping status: Never Used  Substance and Sexual Activity   Alcohol use: No   Drug use: No   Sexual activity: Not on file  Other Topics Concern   Not on file  Social History Narrative   The patient is married and retired   He reports 3 grown children   Former smoker, no alcohol or tobacco or drug use at this time   Social Drivers of Corporate investment banker Strain: Low Risk  (12/21/2023)   Overall Financial Resource Strain (CARDIA)    Difficulty of Paying Living Expenses: Not hard at all  Food Insecurity: No Food Insecurity  (12/21/2023)   Hunger Vital Sign    Worried About Running Out of Food in the Last Year: Never true    Ran Out of Food in the Last Year: Never true  Transportation Needs: No Transportation Needs (12/21/2023)   PRAPARE - Administrator, Civil Service (Medical): No    Lack of Transportation (Non-Medical): No  Physical Activity: Inactive (12/21/2023)   Exercise Vital Sign    Days of Exercise per Week: 0 days    Minutes of Exercise per Session: 0 min  Stress: No Stress Concern Present (12/21/2023)   [employer] of Occupational Health - Occupational Stress Questionnaire    Feeling of Stress : Not at all  Social Connections: Moderately Isolated (12/21/2023)   Social Connection and Isolation Panel    Frequency of Communication with Friends and Family: More than three times a week    Frequency of Social Gatherings with Friends and Family: Once a week    Attends Religious Services: Never    Database administrator or Organizations: No    Attends Banker Meetings: Never    Marital Status: Married  Catering manager Violence: Not At Risk (11/28/2023)   Humiliation, Afraid, Rape, and Kick questionnaire    Fear of Current or Ex-Partner: No    Emotionally Abused: No    Physically Abused: No    Sexually Abused: No    Past Surgical History:  Procedure Laterality Date   AORTIC VALVE REPLACEMENT     CARDIAC CATHETERIZATION  06/2003   CIRCUMCISION     COLONOSCOPY     CORONARY ARTERY BYPASS GRAFT     CORONARY ATHERECTOMY N/A 05/28/2022   Procedure: CORONARY ATHERECTOMY;  Surgeon: Swaziland, Peter M, MD;  Location: [facility];  Service: Cardiovascular;  Laterality: N/A;   CORONARY LITHOTRIPSY N/A 05/28/2022   Procedure: CORONARY LITHOTRIPSY;  Surgeon: Swaziland, Peter M, MD;  Location: [facility] INVASIVE CV LAB;  Service: Cardiovascular;  Laterality: N/A;   CORONARY STENT INTERVENTION N/A 05/28/2022   Procedure: CORONARY STENT INTERVENTION;  Surgeon: Swaziland, Peter M, MD;   Location: [facility] INVASIVE CV LAB;  Service: Cardiovascular;  Laterality: N/A;   CORONARY STENT INTERVENTION N/A 01/03/2023   Procedure: CORONARY STENT INTERVENTION;  Surgeon: Swaziland, Peter M, MD;  Location: [facility] INVASIVE CV LAB;  Service: Cardiovascular;  Laterality: N/A;   coronary stents      prior to bypass    EP IMPLANTABLE DEVICE N/A 05/06/2016   Procedure: PPM Generator Changeout;  Surgeon: Danelle LELON Birmingham, MD;  Location: [facility];  Service: Cardiovascular;  Laterality: N/A;   ESOPHAGOGASTRODUODENOSCOPY     HERNIA REPAIR     INGUINAL HERNIA REPAIR Bilateral 02/07/2013   Procedure: LAPAROSCOPIC BILATERAL INGUINAL HERNIA REPAIR;  Surgeon: Donnice POUR. Belinda, MD;  Location: [facility];  Service: General;  Laterality: Bilateral;   INSERTION OF MESH Bilateral 02/07/2013   Procedure: INSERTION OF MESH;  Surgeon: Donnice POUR. Belinda, MD;  Location: [facility];  Service: General;  Laterality: Bilateral;   LEFT HEART CATH AND CORS/GRAFTS ANGIOGRAPHY N/A 05/27/2022   Procedure: LEFT HEART CATH AND CORS/GRAFTS ANGIOGRAPHY;  Surgeon: Dann Candyce RAMAN, MD;  Location: [facility];  Service: Cardiovascular;  Laterality: N/A;   LEFT HEART CATH AND CORS/GRAFTS ANGIOGRAPHY N/A 01/03/2023   Procedure: LEFT HEART CATH AND CORS/GRAFTS ANGIOGRAPHY;  Surgeon: Swaziland, Peter M, MD;  Location: [facility] INVASIVE CV LAB;  Service: Cardiovascular;  Laterality: N/A;   LUMBAR LAMINECTOMY/DECOMPRESSION MICRODISCECTOMY N/A 03/15/2018   Procedure: Complete decompressive lumbar laminectomy for spinal stenosis of L4-L5 and foraminotomy for L4-L5 root on right;  Surgeon: Heide Ingle, MD;  Location: [facility];  Service: Orthopedics;  Laterality: N/A;   PACEMAKER INSERTION     SHOULDER SURGERY Bilateral    SKIN BIOPSY  05/04/2019   BASAL CELL CARCINOMA//MID CENTRAL NASAL   SKIN BIOPSY  10/11/2019   Superficial basal cell carcinoma - left superior central forehead shave    Family History  Problem Relation Age of Onset   Heart  attack Father    Kidney cancer Father    Hypertension Mother    Diabetes Mother    Diabetes Brother    Epilepsy Sister    Diabetes Sister    Diabetes Brother    Colon cancer Neg Hx    Rectal cancer Neg Hx    Stomach cancer Neg Hx    Esophageal cancer Neg Hx     Allergies  Allergen Reactions   Bee Venom Shortness Of Breath and Swelling   Codeine Phosphate Hives, Shortness Of Breath, Itching, Rash and Other (See Comments)    Tolerated multiple doses of IV morphine    Metoprolol  Tartrate Other (See Comments)    loss of vision. Can take the XL tablet not regular    Pramipexole  Other (See Comments)    Hallucinations     Tramadol      Pt has hallucinations    Ramipril  Rash and Cough    Current Outpatient Medications on File Prior to Visit  Medication Sig Dispense Refill   ACCU-CHEK GUIDE TEST test strip USE AS INSTRUCTED 300 strip 3   Accu-Chek Softclix Lancets lancets USE TO CHECK BLOOD SUGAR TWICE A DAY AND AS NEEDED 300 each 3   acetaminophen  (TYLENOL ) 650 MG CR tablet Take 1,300 mg by mouth 2 (two) times daily.     albuterol  (VENTOLIN  HFA) 108 (90 Base) MCG/ACT inhaler INHALE 2 PUFFS INTO THE LUNGS EVERY 6 HOURS AS NEEDED FOR WHEEZING OR SHORTNESS OF BREATH. 3 each 0   Ascorbic Acid  (VITAMIN C ) 1000 MG tablet Take 1,000 mg by mouth daily.     aspirin  81 MG chewable tablet Chew 1 tablet (81 mg total) by mouth daily.     atorvastatin  (LIPITOR) 80 MG tablet TAKE 1 TABLET EVERY DAY 90 tablet 3   calcium  carbonate (OS-CAL) 600 MG TABS Take 600 mg by mouth 2 (two) times daily with a meal.     carbidopa -levodopa  (SINEMET  CR) 50-200 MG tablet Take 1 tablet by mouth at bedtime.     carbidopa -levodopa  (SINEMET  IR) 25-100 MG tablet Take 1 tablet by mouth 4 (four) times daily.     cetirizine (ZYRTEC) 10 MG tablet Take 10 mg by mouth daily.      cholecalciferol  (VITAMIN D ) 25 MCG (1000 UNIT) tablet Take 1,000 Units  by mouth every evening.     fluticasone  (FLONASE ) 50 MCG/ACT nasal spray  Place 2 sprays into both nostrils daily. 16 g 6   furosemide  (LASIX ) 40 MG tablet Take 1 tablet (40 mg total) by mouth daily. 90 tablet 3   isosorbide  mononitrate (IMDUR ) 60 MG 24 hr tablet Take 1 tablet (60 mg total) by mouth daily. (Patient taking differently: Take 60 mg by mouth daily. Taking BID) 90 tablet 3   metFORMIN  (GLUCOPHAGE ) 500 MG tablet TAKE 1/2 TABLET EVERY DAY WITH BREAKFAST 45 tablet 3   metoprolol  succinate (TOPROL -XL) 50 MG 24 hr tablet TAKE 1 TABLET (50 MG TOTAL) BY MOUTH DAILY. TAKE WITH OR IMMEDIATELY FOLLOWING A MEAL. 90 tablet 3   Misc Natural Products (TART CHERRY ADVANCED) CAPS Take 2 capsules by mouth daily.      Multiple Vitamin (MULTIVITAMIN) tablet Take 1 tablet by mouth daily.       nitroGLYCERIN  (NITROSTAT ) 0.4 MG SL tablet PLACE 1 TABLET (0.4 MG TOTAL) UNDER THE TONGUE EVERY 5 (FIVE) MINUTES AS NEEDED FOR CHEST PAIN 75 tablet 2   potassium chloride  (KLOR-CON ) 10 MEQ tablet Take 1 tablet (10 mEq total) by mouth daily. 90 tablet 3   Probiotic Product (PHILLIPS COLON HEALTH PO) Take 1 capsule by mouth daily.     vitamin B-12 (CYANOCOBALAMIN ) 1000 MCG tablet Take 1,000 mcg by mouth daily.     warfarin (COUMADIN ) 10 MG tablet TAKE 1/2 TO 1 TABLET DAILY OR AS DIRECTED BY COUMADIN  CLINIC 90 tablet 3   melatonin 5 MG TABS Take 5 mg by mouth at bedtime. (Patient not taking: Reported on 03/23/2024)     No current facility-administered medications on file prior to visit.    BP 136/80   Pulse 73   Temp 98.1 F (36.7 C) (Oral)   Ht 5' 6.75 (1.695 m)   Wt 180 lb (81.6 kg)   SpO2 98%   BMI 28.40 kg/m       Objective:   Physical Exam Vitals and nursing note reviewed.  Constitutional:      Appearance: Normal appearance. He is obese.  Cardiovascular:     Rate and Rhythm: Normal rate and regular rhythm.     Pulses: Normal pulses.     Heart sounds: Normal heart sounds.  Pulmonary:     Effort: Pulmonary effort is normal.     Breath sounds: Normal breath sounds.   Skin:    General: Skin is warm and dry.  Neurological:     General: No focal deficit present.     Mental Status: He is alert and oriented to person, place, and time.  Psychiatric:        Mood and Affect: Mood normal.        Behavior: Behavior normal.        Thought Content: Thought content normal.        Judgment: Judgment normal.        Assessment & Plan:  1. Diabetes mellitus treated with oral medication (HCC) (Primary)  - POC HgB A1c- 6.0  - Will keep him off Metformin   - Follow up in 3 months  - Continue to eat healthy and exercise  2. Primary hypertension - Well controlled. No change in medication   3. Need for influenza vaccination  - Flu vaccine HIGH DOSE PF(Fluzone Trivalent)   Milarose Savich, NP

## 2024-03-30 ENCOUNTER — Other Ambulatory Visit: Payer: Self-pay

## 2024-03-30 NOTE — Patient Outreach (Signed)
 Complex Care Management   Visit Note  03/30/2024  Name:  [name] MRN: [id number] DOB: 10-Aug-1945  Situation: Referral received for Complex Care Management related to CHF and Diabetes I obtained verbal consent from Patient.  Visit completed with [name] and his spouse via telephone.  Background:   Past Medical History:  Diagnosis Date   Acute renal failure (HCC) 02/09/2013   Aortic stenosis 08/25/2010   a. Bentall aortic root replacement with a St. Jude mechanical valve and Hemashield conduit 02/2004.   Arthritis    Asthma    AV BLOCK, COMPLETE    a. s/p St Jude dual chamber pacemaker 02/2004.   CAD (coronary artery disease)    a. s/p CABGx2 (LIMA-dLAD, SVG-Cx). b. Low risk nuc 12/2012 without ischemia, EF 46% mild apical hypokinesia (EF 55% inf HK by echo).   Carotid artery disease (HCC)    a. 0-39% bilateral ICA stenosis, stable mild hard plaque in carotid bulbs. F/u 03/2014 recommended.   Diabetes mellitus without complication (HCC)    type 2    DIVERTICULOSIS, COLON 10/17/2007   Ejection fraction    a. EF 55% with inf HK, mild MR by echo 12/2012.   GERD (gastroesophageal reflux disease)    hxof    GOUT 01/16/2007   Headache    hx of migraines    HEMORRHOIDS, INTERNAL 11/01/2008   Hx of adenomatous polyp of colon 02/2019   no recall needed   HYPERLIPIDEMIA 01/16/2007   HYPERTENSION 01/16/2007   Impaired glucose tolerance    LEG EDEMA 11/27/2008   Special screening for malignant neoplasm of prostate    Warfarin anticoagulation    AVR    Assessment: Patient Reported Symptoms:  Cognitive Cognitive Status: Alert and oriented to person, place, and time, Normal speech and language skills Cognitive/Intellectual Conditions Management [RPT]: None reported or documented in medical history or problem list Health Maintenance Behaviors: Hobbies, Healthy diet, Annual physical exam, Spiritual practice(s) Healing Pattern: Average Health Facilitated by: Pain control, Rest, Healthy diet   Neurological Neurological Review of Symptoms: No symptoms reported Neurological Management Strategies: Coping strategies, Medication therapy, Medical device Neurological Self-Management Outcome: 5 (very good)  HEENT HEENT Symptoms Reported: No symptoms reported HEENT Management Strategies: Medical device, Routine screening HEENT Self-Management Outcome: 4 (good)  Cardiovascular Cardiovascular Symptoms Reported: No symptoms reported Does patient have uncontrolled Hypertension?: No Cardiovascular Management Strategies: Adequate rest, Coping strategies, Diet modification, Fluid modification, Medical device, Medication therapy, Routine screening, Weight management Do You Have a Working Readable Scale?: Yes Weight: 174 lb (78.9 kg) Cardiovascular Self-Management Outcome: 5 (very good)  Respiratory Respiratory Symptoms Reported: No symptoms reported Respiratory Self-Management Outcome: 5 (very good)  Endocrine Endocrine Symptoms Reported: No symptoms reported Is patient diabetic?: Yes Is patient checking blood sugars at home?: Yes List most recent blood sugar readings, include date and time of day: Reports fasting reading of 99 mg/dl Endocrine Self-Management Outcome: 5 (very good)  Gastrointestinal Gastrointestinal Symptoms Reported: No symptoms reported Gastrointestinal Self-Management Outcome: 4 (good)  Genitourinary Genitourinary Symptoms Reported: No symptoms reported Genitourinary Self-Management Outcome: 4 (good)  Integumentary Integumentary Symptoms Reported: No symptoms reported Skin Management Strategies: Routine screening Skin Self-Management Outcome: 4 (good)  Musculoskeletal Musculoskelatal Symptoms Reviewed: Unsteady gait Additional Musculoskeletal Details: Uses an assistive device as needed with ambulation. Reports unsteady gait due to Parkinson's Disease Musculoskeletal Management Strategies: Coping strategies, Medical device, Medication therapy, Routine  screening Musculoskeletal Self-Management Outcome: 4 (good)  Psychosocial Psychosocial Symptoms Reported: No symptoms reported Quality of Family Relationships: helpful, involved,  supportive Do you feel physically threatened by others?: No    03/30/2024    PHQ2-9 Depression Screening   Little interest or pleasure in doing things Not at all  Feeling down, depressed, or hopeless Not at all  PHQ-2 - Total Score 0    There were no vitals filed for this visit.  Medications Reviewed Today     Reviewed by Karoline Lima, RN (Registered Nurse) on 03/30/24 at 1147  Med List Status: <None>   Medication Order Taking? Sig Documenting Provider Last Dose Status Informant  ACCU-CHEK GUIDE TEST test strip [id number]  USE AS INSTRUCTED Nafziger, Darleene, NP  Active   Accu-Chek Softclix Lancets lancets [id number]  USE TO CHECK BLOOD SUGAR TWICE A DAY AND AS NEEDED Nafziger, Darleene, NP  Active   acetaminophen  (TYLENOL ) 650 MG CR tablet [id number]  Take 1,300 mg by mouth 2 (two) times daily. [provider]  Active Spouse/Significant Other  albuterol  (VENTOLIN  HFA) 108 (90 Base) MCG/ACT inhaler [id number]  INHALE 2 PUFFS INTO THE LUNGS EVERY 6 HOURS AS NEEDED FOR WHEEZING OR SHORTNESS OF BREATH. Nafziger, Darleene, NP  Active   Ascorbic Acid  (VITAMIN C ) 1000 MG tablet [id number]  Take 1,000 mg by mouth daily. [provider]  Active Spouse/Significant Other  aspirin  81 MG chewable tablet [id number]  Chew 1 tablet (81 mg total) by mouth daily. Jeffrie Oneil BROCKS, MD  Active   atorvastatin  (LIPITOR) 80 MG tablet [id number]  TAKE 1 TABLET EVERY DAY Jeffrie Oneil BROCKS, MD  Active   calcium  carbonate (OS-CAL) 600 MG TABS [id number]  Take 600 mg by mouth 2 (two) times daily with a meal. [provider]  Active Spouse/Significant Other  carbidopa -levodopa  (SINEMET  CR) 50-200 MG tablet [id number]  Take 1 tablet by mouth at bedtime. [provider]  Active Spouse/Significant Other  carbidopa -levodopa   (SINEMET  IR) 25-100 MG tablet [id number]  Take 1 tablet by mouth 4 (four) times daily. [provider]  Active Spouse/Significant Other  cetirizine (ZYRTEC) 10 MG tablet [id number]  Take 10 mg by mouth daily.  [provider]  Active Spouse/Significant Other  cholecalciferol  (VITAMIN D ) 25 MCG (1000 UNIT) tablet [id number]  Take 1,000 Units by mouth every evening. [provider]  Active Spouse/Significant Other  fluticasone  (FLONASE ) 50 MCG/ACT nasal spray [id number]  Place 2 sprays into both nostrils daily. Merna Darleene, NP  Active Spouse/Significant Other           Med Note HALLIE, Aliyanna Wassmer N   Mon Nov 28, 2023 11:07 AM) Reports taking as needed  furosemide  (LASIX ) 40 MG tablet [id number]  Take 1 tablet (40 mg total) by mouth daily. Nafziger, Darleene, NP  Active   isosorbide  mononitrate (IMDUR ) 60 MG 24 hr tablet [id number]  Take 1 tablet (60 mg total) by mouth daily.  Patient taking differently: Take 60 mg by mouth daily. Taking BID   Jeffrie Oneil BROCKS, MD  Active   melatonin 5 MG TABS [id number]  Take 5 mg by mouth at bedtime.  Patient not taking: Reported on 03/23/2024   [provider]  Active Spouse/Significant Other           Med Note HALLIE, Marley Charlot N   Mon Nov 28, 2023 11:08 AM) Reports taking 10 mg at bedtime  metoprolol  succinate (TOPROL -XL) 50 MG 24 hr tablet [id number]  TAKE 1 TABLET (50 MG TOTAL) BY MOUTH DAILY. TAKE WITH OR IMMEDIATELY FOLLOWING A MEAL. Jeffrie Oneil BROCKS, MD  Active   Misc Natural Products [facility] ADVANCED) CAPS [id number]  Take 2 capsules by mouth daily.  [provider]  Active Spouse/Significant Other  Multiple Vitamin (MULTIVITAMIN) tablet [id number]  Take 1 tablet by mouth daily.   [provider]  Active Spouse/Significant Other  nitroGLYCERIN  (NITROSTAT ) 0.4 MG SL tablet [id number]  PLACE 1 TABLET (0.4 MG TOTAL) UNDER THE TONGUE EVERY 5 (FIVE) MINUTES AS NEEDED FOR CHEST PAIN Jeffrie Oneil BROCKS, MD  Active   potassium  chloride (KLOR-CON ) 10 MEQ tablet [id number]  Take 1 tablet (10 mEq total) by mouth daily. Merna Huxley, NP  Active   Probiotic Product (PHILLIPS COLON HEALTH PO) [id number]  Take 1 capsule by mouth daily. [provider]  Active Spouse/Significant Other  vitamin B-12 (CYANOCOBALAMIN ) 1000 MCG tablet [id number]  Take 1,000 mcg by mouth daily. [provider]  Active Spouse/Significant Other  warfarin (COUMADIN ) 10 MG tablet [id number]  TAKE 1/2 TO 1 TABLET DAILY OR AS DIRECTED BY COUMADIN  CLINIC Jeffrie Oneil BROCKS, MD  Active             Recommendation:   Continue Current Plan of Care  Follow Up Plan:   Patient has met all care management goals. Care Management case will be closed. Patient has been provided contact information should new needs arise.    Jackson Acron [facility] Population Health RN Care Manager Direct Dial: [phone]  Fax: [phone] Website: [email or web address]

## 2024-03-31 NOTE — Patient Instructions (Signed)
 Thank you for allowing the Complex Care Management team to participate in your care. It was great speaking with you today!  Congratulations on meeting your Care Management goals. Keep up the great work managing your care! Please do not hesitate to call if your health needs change and you require additional outreach.   Jackson Acron [facility] Population Health RN Care Manager Direct Dial: [phone]  Fax: [phone] Website: [email or web address]

## 2024-04-04 ENCOUNTER — Ambulatory Visit
Admission: EM | Admit: 2024-04-04 | Discharge: 2024-04-04 | Disposition: A | Discharge status: Home / Self Care | Attending: Family Medicine | Admitting: Family Medicine

## 2024-04-04 ENCOUNTER — Encounter: Payer: Self-pay | Admitting: Emergency Medicine

## 2024-04-04 ENCOUNTER — Ambulatory Visit (INDEPENDENT_AMBULATORY_CARE_PROVIDER_SITE_OTHER)

## 2024-04-04 DIAGNOSIS — M7989 Other specified soft tissue disorders: Secondary | ICD-10-CM | POA: Diagnosis not present

## 2024-04-04 DIAGNOSIS — S92345A Nondisplaced fracture of fourth metatarsal bone, left foot, initial encounter for closed fracture: Secondary | ICD-10-CM

## 2024-04-04 DIAGNOSIS — M79672 Pain in left foot: Secondary | ICD-10-CM

## 2024-04-04 DIAGNOSIS — M7732 Calcaneal spur, left foot: Secondary | ICD-10-CM | POA: Diagnosis not present

## 2024-04-04 DIAGNOSIS — M19072 Primary osteoarthritis, left ankle and foot: Secondary | ICD-10-CM | POA: Diagnosis not present

## 2024-04-04 DIAGNOSIS — M85872 Other specified disorders of bone density and structure, left ankle and foot: Secondary | ICD-10-CM | POA: Diagnosis not present

## 2024-04-04 NOTE — ED Triage Notes (Signed)
 Dropped a kitchen chair on left foot 1 week ago. Top of foot red and swollen and bruising noted to lateral side of foot.

## 2024-04-04 NOTE — Discharge Instructions (Signed)
 We have placed you in a boot to immobilize the area and try to be as nonweightbearing as possible on this foot.  Ice, elevate, over-the-counter pain relievers as needed and follow-up as soon as possible with orthopedics for a recheck.

## 2024-04-04 NOTE — ED Provider Notes (Signed)
 [facility]    CSN: [id number] Arrival date & time: 04/04/24  1604      History   Chief Complaint No chief complaint on file.   HPI [name] is a 78 y.o. male.   Patient presenting today with 1 week history of dorsal left foot pain, bruising, swelling after dropping a chair onto the area.  He states his pain has been minimal and denies decreased range of motion, numbness or tingling beyond baseline neuropathy symptoms, skin injury bleeding or drainage.  So far icing and elevating with minimal relief.    Past Medical History:  Diagnosis Date   Acute renal failure 02/09/2013   Aortic stenosis 08/25/2010   a. [place] aortic root replacement with a St. Jude mechanical valve and Hemashield conduit 02/2004.   Arthritis    Asthma    AV BLOCK, COMPLETE    a. s/p St Jude dual chamber pacemaker 02/2004.   CAD (coronary artery disease)    a. s/p CABGx2 (LIMA-dLAD, SVG-Cx). b. Low risk nuc 12/2012 without ischemia, EF 46% mild apical hypokinesia (EF 55% inf HK by echo).   Carotid artery disease    a. 0-39% bilateral ICA stenosis, stable mild hard plaque in carotid bulbs. F/u 03/2014 recommended.   Diabetes mellitus without complication (HCC)    type 2    DIVERTICULOSIS, COLON 10/17/2007   Ejection fraction    a. EF 55% with inf HK, mild MR by echo 12/2012.   GERD (gastroesophageal reflux disease)    hxof    GOUT 01/16/2007   Headache    hx of migraines    HEMORRHOIDS, INTERNAL 11/01/2008   Hx of adenomatous polyp of colon 02/2019   no recall needed   HYPERLIPIDEMIA 01/16/2007   HYPERTENSION 01/16/2007   Impaired glucose tolerance    LEG EDEMA 11/27/2008   Special screening for malignant neoplasm of prostate    Warfarin anticoagulation    AVR    Patient Active Problem List   Diagnosis Date Noted   Parkinson disease (HCC) 01/01/2023   Unstable angina (HCC) 12/31/2022   Thoracic aortic aneurysm 07/20/2022   NSTEMI (non-ST elevated myocardial infarction) (HCC) 05/25/2022    Chronic diastolic heart failure (HCC) 06/22/2019   Osteoarthritis of both knees 07/21/2018   Spinal stenosis, lumbar region with neurogenic claudication 03/15/2018   Non insulin  dependent diabetes mellitus with ophthalmic complication 01/26/2016   DM type 2 (diabetes mellitus, type 2) (HCC) 06/09/2015   Encounter for therapeutic drug monitoring 08/08/2013   History of transcatheter aortic valve replacement (TAVR) 02/21/2013   Mitral regurgitation 01/21/2013   Recurrent right inguinal hernia 01/02/2013   Left inguinal hernia 01/02/2013   Carotid artery disease    Warfarin anticoagulation    Hx of CABG    Aortic stenosis 08/25/2010   Hx of aortic valve replacement, mechanical 05/23/2009   AV BLOCK, COMPLETE 11/01/2008   History of permanent cardiac pacemaker placement 11/01/2008   GOUT 01/16/2007   CAD (coronary artery disease), autologous vein bypass graft 01/16/2007   HLD (hyperlipidemia) 01/16/2007   HTN (hypertension) 01/16/2007    Past Surgical History:  Procedure Laterality Date   AORTIC VALVE REPLACEMENT     CARDIAC CATHETERIZATION  06/2003   CIRCUMCISION     COLONOSCOPY     CORONARY ARTERY BYPASS GRAFT     CORONARY ATHERECTOMY N/A 05/28/2022   Procedure: CORONARY ATHERECTOMY;  Surgeon: Swaziland, Peter M, MD;  Location: [facility] INVASIVE CV LAB;  Service: Cardiovascular;  Laterality: N/A;   CORONARY LITHOTRIPSY  N/A 05/28/2022   Procedure: CORONARY LITHOTRIPSY;  Surgeon: Swaziland, Peter M, MD;  Location: [facility] INVASIVE CV LAB;  Service: Cardiovascular;  Laterality: N/A;   CORONARY STENT INTERVENTION N/A 05/28/2022   Procedure: CORONARY STENT INTERVENTION;  Surgeon: Swaziland, Peter M, MD;  Location: [facility] INVASIVE CV LAB;  Service: Cardiovascular;  Laterality: N/A;   CORONARY STENT INTERVENTION N/A 01/03/2023   Procedure: CORONARY STENT INTERVENTION;  Surgeon: Swaziland, Peter M, MD;  Location[facility] INVASIVE CV LAB;  Service: Cardiovascular;  Laterality: N/A;   coronary stents      prior to  bypass    EP IMPLANTABLE DEVICE N/A 05/06/2016   Procedure: PPM Generator Changeout;  Surgeon: Danelle LELON Birmingham, MD;  Location: [facility] INVASIVE CV LAB;  Service: Cardiovascular;  Laterality: N/A;   ESOPHAGOGASTRODUODENOSCOPY     HERNIA REPAIR     INGUINAL HERNIA REPAIR Bilateral 02/07/2013   Procedure: LAPAROSCOPIC BILATERAL INGUINAL HERNIA REPAIR;  Surgeon: Donnice POUR. Belinda, MD;  Location: [facility];  Service: General;  Laterality: Bilateral;   INSERTION OF MESH Bilateral 02/07/2013   Procedure: INSERTION OF MESH;  Surgeon: Donnice POUR. Belinda, MD;  Location: [facility];  Service: General;  Laterality: Bilateral;   LEFT HEART CATH AND CORS/GRAFTS ANGIOGRAPHY N/A 05/27/2022   Procedure: LEFT HEART CATH AND CORS/GRAFTS ANGIOGRAPHY;  Surgeon: Dann Candyce RAMAN, MD;  Location: [facility] INVASIVE CV LAB;  Service: Cardiovascular;  Laterality: N/A;   LEFT HEART CATH AND CORS/GRAFTS ANGIOGRAPHY N/A 01/03/2023   Procedure: LEFT HEART CATH AND CORS/GRAFTS ANGIOGRAPHY;  Surgeon: Swaziland, Peter M, MD;  Location: [facility] INVASIVE CV LAB;  Service: Cardiovascular;  Laterality: N/A;   LUMBAR LAMINECTOMY/DECOMPRESSION MICRODISCECTOMY N/A 03/15/2018   Procedure: Complete decompressive lumbar laminectomy for spinal stenosis of L4-L5 and foraminotomy for L4-L5 root on right;  Surgeon: Heide Ingle, MD;  Location: [facility];  Service: Orthopedics;  Laterality: N/A;   PACEMAKER INSERTION     SHOULDER SURGERY Bilateral    SKIN BIOPSY  05/04/2019   BASAL CELL CARCINOMA//MID CENTRAL NASAL   SKIN BIOPSY  10/11/2019   Superficial basal cell carcinoma - left superior central forehead shave       Home Medications    Prior to Admission medications   Medication Sig Start Date End Date Taking? Authorizing Provider  ACCU-CHEK GUIDE TEST test strip USE AS INSTRUCTED 07/07/23   Merna Huxley, NP  Accu-Chek Softclix Lancets lancets USE TO CHECK BLOOD SUGAR TWICE A DAY AND AS NEEDED 07/07/23   Nafziger, Huxley, NP  acetaminophen  (TYLENOL ) 650 MG CR  tablet Take 1,300 mg by mouth 2 (two) times daily.    [provider]  albuterol  (VENTOLIN  HFA) 108 (90 Base) MCG/ACT inhaler INHALE 2 PUFFS INTO THE LUNGS EVERY 6 HOURS AS NEEDED FOR WHEEZING OR SHORTNESS OF BREATH. 12/02/23   Nafziger, Huxley, NP  Ascorbic Acid  (VITAMIN C ) 1000 MG tablet Take 1,000 mg by mouth daily.    [provider]  aspirin  81 MG chewable tablet Chew 1 tablet (81 mg total) by mouth daily. 05/06/23   Jeffrie Oneil BROCKS, MD  atorvastatin  (LIPITOR) 80 MG tablet TAKE 1 TABLET EVERY DAY 04/28/23   Jeffrie Oneil BROCKS, MD  calcium  carbonate (OS-CAL) 600 MG TABS Take 600 mg by mouth 2 (two) times daily with a meal.    [provider]  carbidopa -levodopa  (SINEMET  CR) 50-200 MG tablet Take 1 tablet by mouth at bedtime.    [provider]  carbidopa -levodopa  (SINEMET  IR) 25-100 MG tablet Take 1 tablet by mouth 4 (four) times daily.  [provider]  cetirizine (ZYRTEC) 10 MG tablet Take 10 mg by mouth daily.     [provider]  cholecalciferol  (VITAMIN D ) 25 MCG (1000 UNIT) tablet Take 1,000 Units by mouth every evening.    [provider]  fluticasone  (FLONASE ) 50 MCG/ACT nasal spray Place 2 sprays into both nostrils daily. 12/28/18   Nafziger, Darleene, NP  furosemide  (LASIX ) 40 MG tablet Take 1 tablet (40 mg total) by mouth daily. 06/23/23   Nafziger, Darleene, NP  isosorbide  mononitrate (IMDUR ) 60 MG 24 hr tablet Take 1 tablet (60 mg total) by mouth daily. 02/25/24   Jeffrie Oneil BROCKS, MD  melatonin 5 MG TABS Take 5 mg by mouth at bedtime. Patient taking differently: Take 10 mg by mouth at bedtime.    [provider]  metoprolol  succinate (TOPROL -XL) 50 MG 24 hr tablet TAKE 1 TABLET (50 MG TOTAL) BY MOUTH DAILY. TAKE WITH OR IMMEDIATELY FOLLOWING A MEAL. 02/09/24   Jeffrie Oneil BROCKS, MD  Misc Natural Products [facility] ADVANCED) CAPS Take 2 capsules by mouth daily.     [provider]  Multiple Vitamin (MULTIVITAMIN) tablet  Take 1 tablet by mouth daily.      [provider]  nitroGLYCERIN  (NITROSTAT ) 0.4 MG SL tablet PLACE 1 TABLET (0.4 MG TOTAL) UNDER THE TONGUE EVERY 5 (FIVE) MINUTES AS NEEDED FOR CHEST PAIN 10/06/23   Jeffrie Oneil BROCKS, MD  potassium chloride  (KLOR-CON ) 10 MEQ tablet Take 1 tablet (10 mEq total) by mouth daily. 06/23/23   Nafziger, Darleene, NP  Probiotic Product (PHILLIPS COLON HEALTH PO) Take 1 capsule by mouth daily.    [provider]  vitamin B-12 (CYANOCOBALAMIN ) 1000 MCG tablet Take 1,000 mcg by mouth daily.    [provider]  warfarin (COUMADIN ) 10 MG tablet TAKE 1/2 TO 1 TABLET DAILY OR AS DIRECTED BY COUMADIN  CLINIC 04/18/23   Jeffrie Oneil BROCKS, MD    Family History Family History  Problem Relation Age of Onset   Heart attack Father    Kidney cancer Father    Hypertension Mother    Diabetes Mother    Diabetes Brother    Epilepsy Sister    Diabetes Sister    Diabetes Brother    Colon cancer Neg Hx    Rectal cancer Neg Hx    Stomach cancer Neg Hx    Esophageal cancer Neg Hx     Social History Social History   Tobacco Use   Smoking status: Former    Current packs/day: 0.00    Types: Cigarettes    Quit date: 07/12/1973    Years since quitting: 50.7   Smokeless tobacco: Never  Vaping Use   Vaping status: Never Used  Substance Use Topics   Alcohol use: No   Drug use: No     Allergies   Bee venom, Codeine phosphate, Metoprolol  tartrate, Pramipexole , Tramadol , and Ramipril    Review of Systems Review of Systems Per HPI  Physical Exam Triage Vital Signs ED Triage Vitals  Encounter Vitals Group     BP 04/04/24 1650 104/61     Girls Systolic BP Percentile --      Girls Diastolic BP Percentile --      Boys Systolic BP Percentile --      Boys Diastolic BP Percentile --      Pulse Rate 04/04/24 1650 74     Resp 04/04/24 1650 16     Temp 04/04/24 1650 98.3 F (36.8 C)     Temp  Source 04/04/24 1650 Oral     SpO2 04/04/24 1650 95 %     Weight --       Height --      Head Circumference --      Peak Flow --      Pain Score 04/04/24 1653 0     Pain Loc --      Pain Education --      Exclude from Growth Chart --    No data found.  Updated Vital Signs BP 104/61 (BP Location: Left Arm)   Pulse 74   Temp 98.3 F (36.8 C) (Oral)   Resp 16   SpO2 95%   Visual Acuity Right Eye Distance:   Left Eye Distance:   Bilateral Distance:    Right Eye Near:   Left Eye Near:    Bilateral Near:     Physical Exam Vitals and nursing note reviewed.  Constitutional:      Appearance: Normal appearance.  HENT:     Head: Atraumatic.  Eyes:     Extraocular Movements: Extraocular movements intact.     Conjunctiva/sclera: Conjunctivae normal.  Cardiovascular:     Rate and Rhythm: Normal rate.  Pulmonary:     Effort: Pulmonary effort is normal.  Musculoskeletal:        General: Swelling, tenderness and signs of injury present. Normal range of motion.     Cervical back: Normal range of motion and neck supple.     Comments: Hyperpigmentation and bruising to the dorsal left foot, diffuse edema to the area.  No significant tenderness to palpation but patient does note when asked that he has significant neuropathy to the feet.  Range of motion appears intact, no bony deformity palpable  Skin:    General: Skin is warm and dry.     Findings: Bruising present.  Neurological:     Mental Status: He is oriented to person, place, and time.     Comments: Left lower extremity neurovascularly intact  Psychiatric:        Mood and Affect: Mood normal.        Thought Content: Thought content normal.        Judgment: Judgment normal.      UC Treatments / Results  Labs (all labs ordered are listed, but only abnormal results are displayed) Labs Reviewed - No data to display  EKG   Radiology DG Foot Complete Left Result Date: 04/04/2024 CLINICAL DATA:  Dropped kitchen chair on foot 1 week ago. EXAM: LEFT FOOT - COMPLETE 3+ VIEW COMPARISON:  The  bones are diffusely osteopenic. FINDINGS: There is dorsal soft tissue swelling. Peripheral vascular calcifications are present. The bones are diffusely osteopenic. There is oblique linear lucency through the fourth metatarsal head worrisome for nondisplaced fracture. There is no dislocation. There are mild-to-moderate degenerative changes of the first metatarsophalangeal joint. Plantar and posterior calcaneal spurs are present. IMPRESSION: 1. Oblique linear lucency through the fourth metatarsal head worrisome for nondisplaced fracture. 2. Dorsal soft tissue swelling. 3. Osteopenia. Electronically Signed   By: Greig Pique M.D.   On: 04/04/2024 17:27    Procedures Procedures (including critical care time)  Medications Ordered in UC Medications - No data to display  Initial Impression / Assessment and Plan / UC Course  I have reviewed the triage vital signs and the nursing notes.  Pertinent labs & imaging results that were available during my care of the patient were reviewed by me and considered in my medical decision making (see  chart for details).     X-ray today showing a possible nondisplaced fracture of the fourth metatarsal head.  Placed in a boot, discussed to be nonweightbearing as best as possible.  Patient walks with a cane at baseline, discussed some safe possibilities for nonweightbearing.  Follow-up with orthopedics as soon as possible, RICE protocol, over-the-counter pain relievers additionally.  Final Clinical Impressions(s) / UC Diagnoses   Final diagnoses:  Left foot pain  Closed nondisplaced fracture of fourth metatarsal bone of left foot, initial encounter     Discharge Instructions      We have placed you in a boot to immobilize the area and try to be as nonweightbearing as possible on this foot.  Ice, elevate, over-the-counter pain relievers as needed and follow-up as soon as possible with orthopedics for a recheck.    ED Prescriptions   None    PDMP not  reviewed this encounter.   Stuart Vernell Norris, PA-C 04/04/24 1749

## 2024-04-10 DIAGNOSIS — M79675 Pain in left toe(s): Secondary | ICD-10-CM | POA: Diagnosis not present

## 2024-04-10 DIAGNOSIS — L03116 Cellulitis of left lower limb: Secondary | ICD-10-CM | POA: Diagnosis not present

## 2024-04-11 ENCOUNTER — Ambulatory Visit (HOSPITAL_COMMUNITY)
Admission: RE | Admit: 2024-04-11 | Discharge: 2024-04-11 | Disposition: A | Discharge status: Home / Self Care | Source: Ambulatory Visit | Attending: Cardiovascular Disease | Admitting: Cardiovascular Disease

## 2024-04-11 DIAGNOSIS — I7123 Aneurysm of the descending thoracic aorta, without rupture: Secondary | ICD-10-CM | POA: Diagnosis not present

## 2024-04-11 DIAGNOSIS — I77810 Thoracic aortic ectasia: Secondary | ICD-10-CM | POA: Insufficient documentation

## 2024-04-11 DIAGNOSIS — I700 Atherosclerosis of aorta: Secondary | ICD-10-CM | POA: Diagnosis not present

## 2024-04-11 LAB — POCT I-STAT CREATININE: Creatinine, Ser: 0.9 mg/dL (ref 0.61–1.24)

## 2024-04-11 MED ORDER — IOHEXOL 350 MG/ML SOLN
75.0000 mL | Freq: Once | INTRAVENOUS | Status: AC | PRN
  Administered 2024-04-11: 75 mL via INTRAVENOUS

## 2024-04-12 NOTE — Progress Notes (Signed)
 Remote PPM Transmission

## 2024-04-18 DIAGNOSIS — G629 Polyneuropathy, unspecified: Secondary | ICD-10-CM | POA: Diagnosis not present

## 2024-04-18 DIAGNOSIS — G20A2 Parkinson's disease without dyskinesia, with fluctuations: Secondary | ICD-10-CM | POA: Diagnosis not present

## 2024-04-20 ENCOUNTER — Ambulatory Visit: Payer: Self-pay | Admitting: Cardiology

## 2024-04-20 ENCOUNTER — Ambulatory Visit: Attending: Cardiology | Admitting: *Deleted

## 2024-04-20 DIAGNOSIS — I77810 Thoracic aortic ectasia: Secondary | ICD-10-CM

## 2024-04-20 DIAGNOSIS — Z952 Presence of prosthetic heart valve: Secondary | ICD-10-CM | POA: Diagnosis not present

## 2024-04-20 DIAGNOSIS — Z5181 Encounter for therapeutic drug level monitoring: Secondary | ICD-10-CM | POA: Diagnosis not present

## 2024-04-20 DIAGNOSIS — Z09 Encounter for follow-up examination after completed treatment for conditions other than malignant neoplasm: Secondary | ICD-10-CM

## 2024-04-20 DIAGNOSIS — Z8679 Personal history of other diseases of the circulatory system: Secondary | ICD-10-CM

## 2024-04-20 LAB — POCT INR: INR: 2.2 (ref 2.0–3.0)

## 2024-04-20 NOTE — Patient Instructions (Signed)
 Description   INR-2.2; Continue taking Warfarin 1 tablet daily except for 1/2 tablet on Tuesdays and Thursdays. Remain consistent with green leafy veggies. Recheck INR in 6 weeks.  Coumadin  Clinic [phone]

## 2024-04-20 NOTE — Progress Notes (Signed)
 Description   INR-2.2; Continue taking Warfarin 1 tablet daily except for 1/2 tablet on Tuesdays and Thursdays. Remain consistent with green leafy veggies. Recheck INR in 6 weeks.  Coumadin  Clinic [phone]

## 2024-04-24 DIAGNOSIS — L03116 Cellulitis of left lower limb: Secondary | ICD-10-CM | POA: Diagnosis not present

## 2024-04-24 DIAGNOSIS — L97529 Non-pressure chronic ulcer of other part of left foot with unspecified severity: Secondary | ICD-10-CM | POA: Diagnosis not present

## 2024-04-24 DIAGNOSIS — M79672 Pain in left foot: Secondary | ICD-10-CM | POA: Diagnosis not present

## 2024-04-24 DIAGNOSIS — M79675 Pain in left toe(s): Secondary | ICD-10-CM | POA: Diagnosis not present

## 2024-04-24 DIAGNOSIS — M7989 Other specified soft tissue disorders: Secondary | ICD-10-CM | POA: Diagnosis not present

## 2024-04-24 DIAGNOSIS — M170 Bilateral primary osteoarthritis of knee: Secondary | ICD-10-CM | POA: Diagnosis not present

## 2024-05-02 ENCOUNTER — Encounter: Payer: Self-pay | Admitting: Cardiology

## 2024-05-03 ENCOUNTER — Ambulatory Visit

## 2024-05-03 ENCOUNTER — Ambulatory Visit: Payer: Self-pay | Admitting: Internal Medicine

## 2024-05-03 DIAGNOSIS — I442 Atrioventricular block, complete: Secondary | ICD-10-CM

## 2024-05-03 LAB — CUP PACEART REMOTE DEVICE CHECK
Battery Remaining Longevity: 31 mo
Battery Remaining Percentage: 25 %
Battery Voltage: 2.92 V
Brady Statistic AP VP Percent: 32 %
Brady Statistic AP VS Percent: 1 %
Brady Statistic AS VP Percent: 68 %
Brady Statistic AS VS Percent: 1 %
Brady Statistic RA Percent Paced: 31 %
Brady Statistic RV Percent Paced: 99 %
Date Time Interrogation Session: 20251023020014
Implantable Lead Connection Status: 753985
Implantable Lead Connection Status: 753985
Implantable Lead Implant Date: 20050816
Implantable Lead Implant Date: 20050816
Implantable Lead Location: 753859
Implantable Lead Location: 753860
Implantable Pulse Generator Implant Date: 20171026
Lead Channel Impedance Value: 460 Ohm
Lead Channel Impedance Value: 650 Ohm
Lead Channel Pacing Threshold Amplitude: 0.5 V
Lead Channel Pacing Threshold Amplitude: 0.875 V
Lead Channel Pacing Threshold Pulse Width: 0.5 ms
Lead Channel Pacing Threshold Pulse Width: 0.5 ms
Lead Channel Sensing Intrinsic Amplitude: 12 mV
Lead Channel Sensing Intrinsic Amplitude: 2.2 mV
Lead Channel Setting Pacing Amplitude: 0.75 V
Lead Channel Setting Pacing Amplitude: 1.875
Lead Channel Setting Pacing Pulse Width: 0.5 ms
Lead Channel Setting Sensing Sensitivity: 4 mV
Pulse Gen Model: 2272
Pulse Gen Serial Number: 3182792

## 2024-05-04 NOTE — Progress Notes (Signed)
 Remote PPM Transmission

## 2024-05-07 ENCOUNTER — Other Ambulatory Visit: Payer: Self-pay | Admitting: Adult Health

## 2024-05-07 ENCOUNTER — Other Ambulatory Visit: Payer: Self-pay | Admitting: Cardiology

## 2024-05-07 DIAGNOSIS — I10 Essential (primary) hypertension: Secondary | ICD-10-CM

## 2024-05-08 DIAGNOSIS — M79672 Pain in left foot: Secondary | ICD-10-CM | POA: Diagnosis not present

## 2024-05-08 DIAGNOSIS — M7989 Other specified soft tissue disorders: Secondary | ICD-10-CM | POA: Diagnosis not present

## 2024-05-08 DIAGNOSIS — L03116 Cellulitis of left lower limb: Secondary | ICD-10-CM | POA: Diagnosis not present

## 2024-05-24 DIAGNOSIS — Z950 Presence of cardiac pacemaker: Secondary | ICD-10-CM | POA: Diagnosis not present

## 2024-05-24 DIAGNOSIS — I25119 Atherosclerotic heart disease of native coronary artery with unspecified angina pectoris: Secondary | ICD-10-CM | POA: Diagnosis not present

## 2024-05-24 DIAGNOSIS — J45909 Unspecified asthma, uncomplicated: Secondary | ICD-10-CM | POA: Diagnosis not present

## 2024-05-24 DIAGNOSIS — Z885 Allergy status to narcotic agent status: Secondary | ICD-10-CM | POA: Diagnosis not present

## 2024-05-24 DIAGNOSIS — E876 Hypokalemia: Secondary | ICD-10-CM | POA: Diagnosis not present

## 2024-05-24 DIAGNOSIS — Z9181 History of falling: Secondary | ICD-10-CM | POA: Diagnosis not present

## 2024-05-24 DIAGNOSIS — Z8249 Family history of ischemic heart disease and other diseases of the circulatory system: Secondary | ICD-10-CM | POA: Diagnosis not present

## 2024-05-24 DIAGNOSIS — E1142 Type 2 diabetes mellitus with diabetic polyneuropathy: Secondary | ICD-10-CM | POA: Diagnosis not present

## 2024-05-24 DIAGNOSIS — R32 Unspecified urinary incontinence: Secondary | ICD-10-CM | POA: Diagnosis not present

## 2024-05-24 DIAGNOSIS — R600 Localized edema: Secondary | ICD-10-CM | POA: Diagnosis not present

## 2024-05-24 DIAGNOSIS — N529 Male erectile dysfunction, unspecified: Secondary | ICD-10-CM | POA: Diagnosis not present

## 2024-05-24 DIAGNOSIS — G20A1 Parkinson's disease without dyskinesia, without mention of fluctuations: Secondary | ICD-10-CM | POA: Diagnosis not present

## 2024-05-24 DIAGNOSIS — Z85828 Personal history of other malignant neoplasm of skin: Secondary | ICD-10-CM | POA: Diagnosis not present

## 2024-05-24 DIAGNOSIS — Z7901 Long term (current) use of anticoagulants: Secondary | ICD-10-CM | POA: Diagnosis not present

## 2024-05-24 DIAGNOSIS — Z888 Allergy status to other drugs, medicaments and biological substances status: Secondary | ICD-10-CM | POA: Diagnosis not present

## 2024-05-24 DIAGNOSIS — I252 Old myocardial infarction: Secondary | ICD-10-CM | POA: Diagnosis not present

## 2024-05-24 DIAGNOSIS — Z7982 Long term (current) use of aspirin: Secondary | ICD-10-CM | POA: Diagnosis not present

## 2024-05-24 DIAGNOSIS — E785 Hyperlipidemia, unspecified: Secondary | ICD-10-CM | POA: Diagnosis not present

## 2024-05-24 DIAGNOSIS — I10 Essential (primary) hypertension: Secondary | ICD-10-CM | POA: Diagnosis not present

## 2024-06-01 ENCOUNTER — Ambulatory Visit: Attending: Cardiology | Admitting: *Deleted

## 2024-06-01 DIAGNOSIS — Z952 Presence of prosthetic heart valve: Secondary | ICD-10-CM

## 2024-06-01 DIAGNOSIS — Z5181 Encounter for therapeutic drug level monitoring: Secondary | ICD-10-CM | POA: Diagnosis not present

## 2024-06-01 DIAGNOSIS — Z09 Encounter for follow-up examination after completed treatment for conditions other than malignant neoplasm: Secondary | ICD-10-CM

## 2024-06-01 LAB — POCT INR: INR: 2.2 (ref 2.0–3.0)

## 2024-06-01 NOTE — Progress Notes (Signed)
 Description   INR-2.2; Continue taking Warfarin 1 tablet daily except for 1/2 tablet on Tuesdays and Thursdays. Remain consistent with green leafy veggies. Recheck INR in 7 weeks.  Coumadin  Clinic [phone]

## 2024-06-01 NOTE — Patient Instructions (Signed)
 Description   INR-2.2; Continue taking Warfarin 1 tablet daily except for 1/2 tablet on Tuesdays and Thursdays. Remain consistent with green leafy veggies. Recheck INR in 7 weeks.  Coumadin  Clinic [phone]

## 2024-06-06 ENCOUNTER — Other Ambulatory Visit: Payer: Self-pay | Admitting: Cardiology

## 2024-06-06 NOTE — Telephone Encounter (Signed)
 Refill request for warfarin:  Last INR was 2.2 on 06/01/24 Next INR due 07/20/24 LOV was 07/26/23  Refill approved.

## 2024-06-22 ENCOUNTER — Encounter: Payer: Self-pay | Admitting: Cardiology

## 2024-06-26 ENCOUNTER — Ambulatory Visit: Payer: Self-pay | Admitting: Adult Health

## 2024-06-26 ENCOUNTER — Ambulatory Visit: Admitting: Adult Health

## 2024-06-26 VITALS — BP 138/88 | HR 86 | Temp 98.8°F | Ht 66.75 in | Wt 181.0 lb

## 2024-06-26 DIAGNOSIS — M170 Bilateral primary osteoarthritis of knee: Secondary | ICD-10-CM

## 2024-06-26 DIAGNOSIS — G20A1 Parkinson's disease without dyskinesia, without mention of fluctuations: Secondary | ICD-10-CM

## 2024-06-26 DIAGNOSIS — E119 Type 2 diabetes mellitus without complications: Secondary | ICD-10-CM

## 2024-06-26 DIAGNOSIS — Z0000 Encounter for general adult medical examination without abnormal findings: Secondary | ICD-10-CM

## 2024-06-26 DIAGNOSIS — E785 Hyperlipidemia, unspecified: Secondary | ICD-10-CM | POA: Diagnosis not present

## 2024-06-26 DIAGNOSIS — I10 Essential (primary) hypertension: Secondary | ICD-10-CM

## 2024-06-26 DIAGNOSIS — Z952 Presence of prosthetic heart valve: Secondary | ICD-10-CM

## 2024-06-26 DIAGNOSIS — I25810 Atherosclerosis of coronary artery bypass graft(s) without angina pectoris: Secondary | ICD-10-CM

## 2024-06-26 LAB — CBC
HCT: 41.6 % (ref 39.0–52.0)
Hemoglobin: 13.8 g/dL (ref 13.0–17.0)
MCHC: 33.2 g/dL (ref 30.0–36.0)
MCV: 89.1 fl (ref 78.0–100.0)
Platelets: 157 K/uL (ref 150.0–400.0)
RBC: 4.67 Mil/uL (ref 4.22–5.81)
RDW: 15.3 % (ref 11.5–15.5)
WBC: 6 K/uL (ref 4.0–10.5)

## 2024-06-26 LAB — MICROALBUMIN / CREATININE URINE RATIO
Creatinine,U: 111.4 mg/dL
Microalb Creat Ratio: 12.2 mg/g (ref 0.0–30.0)
Microalb, Ur: 1.4 mg/dL (ref 0.7–1.9)

## 2024-06-26 LAB — LIPID PANEL
Cholesterol: 123 mg/dL (ref 28–200)
HDL: 49.8 mg/dL (ref >39.00)
LDL Cholesterol: 56 mg/dL (ref 10–99)
NonHDL: 73.59
Total CHOL/HDL Ratio: 2
Triglycerides: 89 mg/dL (ref 10.0–149.0)
VLDL: 17.8 mg/dL (ref 0.0–40.0)

## 2024-06-26 LAB — COMPREHENSIVE METABOLIC PANEL WITH GFR
ALT: 9 U/L (ref 3–53)
AST: 29 U/L (ref 5–37)
Albumin: 4.2 g/dL (ref 3.5–5.2)
Alkaline Phosphatase: 48 U/L (ref 39–117)
BUN: 16 mg/dL (ref 6–23)
CO2: 31 meq/L (ref 19–32)
Calcium: 9.2 mg/dL (ref 8.4–10.5)
Chloride: 102 meq/L (ref 96–112)
Creatinine, Ser: 0.75 mg/dL (ref 0.40–1.50)
GFR: 85.53 mL/min (ref >60.00)
Glucose, Bld: 93 mg/dL (ref 70–99)
Potassium: 3.6 meq/L (ref 3.5–5.1)
Sodium: 140 meq/L (ref 135–145)
Total Bilirubin: 0.9 mg/dL (ref 0.2–1.2)
Total Protein: 6.2 g/dL (ref 6.0–8.3)

## 2024-06-26 LAB — HEMOGLOBIN A1C: Hgb A1c MFr Bld: 6.1 % (ref 4.6–6.5)

## 2024-06-26 LAB — TSH: TSH: 1.9 u[IU]/mL (ref 0.35–5.50)

## 2024-06-26 NOTE — Patient Instructions (Signed)
 It was great seeing you today   We will follow up with you regarding your lab work   Please let me know if you need anything

## 2024-06-26 NOTE — Progress Notes (Signed)
 Subjective:    Patient ID: Len Azeez Dassow, male    DOB: 1946/01/23, 78 y.o.   MRN: [id number]  HPI Patient presents for yearly preventative medicine examination. He is a pleasant 78 year old male who  has a past medical history of Acute renal failure (02/09/2013), Aortic stenosis (08/25/2010), Arthritis, Asthma, AV BLOCK, COMPLETE, CAD (coronary artery disease), Carotid artery disease, Diabetes mellitus without complication (HCC), DIVERTICULOSIS, COLON (10/17/2007), Ejection fraction, GERD (gastroesophageal reflux disease), GOUT (01/16/2007), Headache, HEMORRHOIDS, INTERNAL (11/01/2008), adenomatous polyp of colon (02/2019), HYPERLIPIDEMIA (01/16/2007), HYPERTENSION (01/16/2007), Impaired glucose tolerance, LEG EDEMA (11/27/2008), Special screening for malignant neoplasm of prostate, and Warfarin anticoagulation.  Diabetes mellitus type 2-- Currently diet controlled. I had him stop his Metformin  about six months ago. He has been checking his BS with readings between 80-120.  Does monitor his blood sugars periodically with readings in the 90's.  He denies symptoms of hypoglycemia. Lab Results  Component Value Date   HGBA1C 6.0 (A) 03/23/2024   HGBA1C 5.8 (A) 12/22/2023   HGBA1C 6.5 06/23/2023   Wt Readings from Last 3 Encounters:  06/26/24 181 lb (82.1 kg)  03/30/24 174 lb (78.9 kg)  03/23/24 180 lb (81.6 kg)   Hypertension - managed with losartan  100 mg daily, Lasix  40 mg daily, and Toprol  100 mg daily.  He denies dizziness, lightheadedness, chest pain, or shortness of breath BP Readings from Last 3 Encounters:  06/26/24 138/88  04/04/24 104/61  03/23/24 136/80   Hyperlipidemia/CAD-managed with Lipitor 80 mg daily.  He denies myalgia or fatigue.  He is on chronic anticoagulation with Plavix  and Coumadin .  Lab Results  Component Value Date   CHOL 128 06/23/2023   HDL 53.00 06/23/2023   LDLCALC 63 06/23/2023   TRIG 63.0 06/23/2023   CHOLHDL 2 06/23/2023    Aortic stenosis with mechanical  valve replacement/complete AV block-mechanical valve replacement in 2005.  He is on anticoagulation with Plavix   and Coumadin  . He is followed by cardiology on a regular basis.  He does have a pacemaker  Osteoarthritis of bilateral knees-is managed by orthopedics.  He receives steroid injections every 3 to 4 months  Parkinson's disease and essential tremor-noted by neurology outside of the [facility] system.  He is currently prescribed Sinemet  IR 25-100 mg.   All immunizations and health maintenance protocols were reviewed with the patient and needed orders were placed.  Appropriate screening laboratory values were ordered for the patient including screening of hyperlipidemia, renal function and hepatic function.   Medication reconciliation,  past medical history, social history, problem list and allergies were reviewed in detail with the patient  Goals were established with regard to weight loss, exercise, and  diet in compliance with medications Wt Readings from Last 3 Encounters:  06/26/24 181 lb (82.1 kg)  03/30/24 174 lb (78.9 kg)  03/23/24 180 lb (81.6 kg)    Review of Systems  Constitutional: Negative.   HENT: Negative.    Eyes: Negative.   Respiratory: Negative.    Cardiovascular: Negative.   Gastrointestinal: Negative.   Endocrine: Negative.   Genitourinary: Negative.   Musculoskeletal: Negative.   Skin: Negative.   Allergic/Immunologic: Negative.   Neurological:  Positive for tremors.  Hematological: Negative.   Psychiatric/Behavioral: Negative.    All other systems reviewed and are negative.  Past Medical History:  Diagnosis Date   Acute renal failure 02/09/2013   Aortic stenosis 08/25/2010   a. Bentall aortic root replacement with a St. Jude mechanical valve and Hemashield conduit 02/2004.  Arthritis    Asthma    AV BLOCK, COMPLETE    a. s/p St Jude dual chamber pacemaker 02/2004.   CAD (coronary artery disease)    a. s/p CABGx2 (LIMA-dLAD, SVG-Cx). b. Low risk nuc  12/2012 without ischemia, EF 46% mild apical hypokinesia (EF 55% inf HK by echo).   Carotid artery disease    a. 0-39% bilateral ICA stenosis, stable mild hard plaque in carotid bulbs. F/u 03/2014 recommended.   Diabetes mellitus without complication (HCC)    type 2    DIVERTICULOSIS, COLON 10/17/2007   Ejection fraction    a. EF 55% with inf HK, mild MR by echo 12/2012.   GERD (gastroesophageal reflux disease)    hxof    GOUT 01/16/2007   Headache    hx of migraines    HEMORRHOIDS, INTERNAL 11/01/2008   Hx of adenomatous polyp of colon 02/2019   no recall needed   HYPERLIPIDEMIA 01/16/2007   HYPERTENSION 01/16/2007   Impaired glucose tolerance    LEG EDEMA 11/27/2008   Special screening for malignant neoplasm of prostate    Warfarin anticoagulation    AVR    Social History   Socioeconomic History   Marital status: Married    Spouse name: Not on file   Number of children: 3   Years of education: Not on file   Highest education level: 12th grade  Occupational History   Occupation: retired  Tobacco Use   Smoking status: Former    Current packs/day: 0.00    Types: Cigarettes    Quit date: 07/12/1973    Years since quitting: 50.9   Smokeless tobacco: Never  Vaping Use   Vaping status: Never Used  Substance and Sexual Activity   Alcohol use: No   Drug use: No   Sexual activity: Not on file  Other Topics Concern   Not on file  Social History Narrative   The patient is married and retired   He reports 3 grown children   Former smoker, no alcohol or tobacco or drug use at this time   Social Drivers of Health   Tobacco Use: Medium Risk (06/26/2024)   Patient History    Smoking Tobacco Use: Former    Smokeless Tobacco Use: Never    Passive Exposure: Not on Actuary Strain: Low Risk (06/26/2024)   Overall Financial Resource Strain (CARDIA)    Difficulty of Paying Living Expenses: Not hard at all  Food Insecurity: No Food Insecurity (06/26/2024)   Epic     Worried About Radiation Protection Practitioner of Food in the Last Year: Never true    Ran Out of Food in the Last Year: Never true  Transportation Needs: No Transportation Needs (06/26/2024)   Epic    Lack of Transportation (Medical): No    Lack of Transportation (Non-Medical): No  Physical Activity: Inactive (06/26/2024)   Exercise Vital Sign    Days of Exercise per Week: 0 days    Minutes of Exercise per Session: Not on file  Stress: No Stress Concern Present (06/26/2024)   [employer] of Occupational Health - Occupational Stress Questionnaire    Feeling of Stress: Not at all  Social Connections: Socially Isolated (06/26/2024)   Social Connection and Isolation Panel    Frequency of Communication with Friends and Family: Once a week    Frequency of Social Gatherings with Friends and Family: Once a week    Attends Religious Services: Patient declined    Database Administrator or Organizations:  No    Attends Banker Meetings: Not on file    Marital Status: Married  Intimate Partner Violence: Not At Risk (11/28/2023)   Humiliation, Afraid, Rape, and Kick questionnaire    Fear of Current or Ex-Partner: No    Emotionally Abused: No    Physically Abused: No    Sexually Abused: No  Depression (PHQ2-9): Low Risk (03/30/2024)   Depression (PHQ2-9)    PHQ-2 Score: 0  Alcohol Screen: Low Risk (06/17/2022)   Alcohol Screen    Last Alcohol Screening Score (AUDIT): 0  Housing: Low Risk (06/26/2024)   Epic    Unable to Pay for Housing in the Last Year: No    Number of Times Moved in the Last Year: 0    Homeless in the Last Year: No  Utilities: Not At Risk (11/28/2023)   AHC Utilities    Threatened with loss of utilities: No  Health Literacy: Adequate Health Literacy (11/28/2023)   B1300 Health Literacy    Frequency of need for help with medical instructions: Rarely    Past Surgical History:  Procedure Laterality Date   AORTIC VALVE REPLACEMENT     CARDIAC CATHETERIZATION  06/2003    CIRCUMCISION     COLONOSCOPY     CORONARY ARTERY BYPASS GRAFT     CORONARY ATHERECTOMY N/A 05/28/2022   Procedure: CORONARY ATHERECTOMY;  Surgeon: Jordan, Peter M, MD;  Location: [facility] INVASIVE CV LAB;  Service: Cardiovascular;  Laterality: N/A;   CORONARY LITHOTRIPSY N/A 05/28/2022   Procedure: CORONARY LITHOTRIPSY;  Surgeon: Jordan, Peter M, MD;  Location: [facility] INVASIVE CV LAB;  Service: Cardiovascular;  Laterality: N/A;   CORONARY STENT INTERVENTION N/A 05/28/2022   Procedure: CORONARY STENT INTERVENTION;  Surgeon: Jordan, Peter M, MD;  Location: [facility] INVASIVE CV LAB;  Service: Cardiovascular;  Laterality: N/A;   CORONARY STENT INTERVENTION N/A 01/03/2023   Procedure: CORONARY STENT INTERVENTION;  Surgeon: Jordan, Peter M, MD;  Location: [facility] INVASIVE CV LAB;  Service: Cardiovascular;  Laterality: N/A;   coronary stents      prior to bypass    EP IMPLANTABLE DEVICE N/A 05/06/2016   Procedure: PPM Generator Changeout;  Surgeon: Danelle LELON Birmingham, MD;  Location: [facility] INVASIVE CV LAB;  Service: Cardiovascular;  Laterality: N/A;   ESOPHAGOGASTRODUODENOSCOPY     HERNIA REPAIR     INGUINAL HERNIA REPAIR Bilateral 02/07/2013   Procedure: LAPAROSCOPIC BILATERAL INGUINAL HERNIA REPAIR;  Surgeon: Donnice POUR. Belinda, MD;  Location: [facility] ORS;  Service: General;  Laterality: Bilateral;   INSERTION OF MESH Bilateral 02/07/2013   Procedure: INSERTION OF MESH;  Surgeon: Donnice POUR. Belinda, MD;  Location: [facility] ORS;  Service: General;  Laterality: Bilateral;   LEFT HEART CATH AND CORS/GRAFTS ANGIOGRAPHY N/A 05/27/2022   Procedure: LEFT HEART CATH AND CORS/GRAFTS ANGIOGRAPHY;  Surgeon: Dann Candyce RAMAN, MD;  Location: [facility] INVASIVE CV LAB;  Service: Cardiovascular;  Laterality: N/A;   LEFT HEART CATH AND CORS/GRAFTS ANGIOGRAPHY N/A 01/03/2023   Procedure: LEFT HEART CATH AND CORS/GRAFTS ANGIOGRAPHY;  Surgeon: Jordan, Peter M, MD;  Location: [facility] INVASIVE CV LAB;  Service: Cardiovascular;  Laterality: N/A;   LUMBAR  LAMINECTOMY/DECOMPRESSION MICRODISCECTOMY N/A 03/15/2018   Procedure: Complete decompressive lumbar laminectomy for spinal stenosis of L4-L5 and foraminotomy for L4-L5 root on right;  Surgeon: Heide Ingle, MD;  Location: [facility] OR[facility];  Service: Orthopedics;  Laterality: N/A;   PACEMAKER INSERTION     SHOULDER SURGERY Bilateral    SKIN BIOPSY  05/04/2019   BASAL CELL CARCINOMA//MID CENTRAL NASAL  SKIN BIOPSY  10/11/2019   Superficial basal cell carcinoma - left superior central forehead shave    Family History  Problem Relation Age of Onset   Heart attack Father    Kidney cancer Father    Hypertension Mother    Diabetes Mother    Diabetes Brother    Epilepsy Sister    Diabetes Sister    Diabetes Brother    Colon cancer Neg Hx    Rectal cancer Neg Hx    Stomach cancer Neg Hx    Esophageal cancer Neg Hx     Allergies[1]  Medications Ordered Prior to Encounter[2]  BP 138/88   Pulse 86   Temp 98.8 F (37.1 C) (Oral)   Ht 5' 6.75 (1.695 m)   Wt 181 lb (82.1 kg)   SpO2 97%   BMI 28.56 kg/m       Objective:   Physical Exam Vitals and nursing note reviewed.  Constitutional:      General: He is not in acute distress.    Appearance: Normal appearance. He is not ill-appearing.  HENT:     Head: Normocephalic and atraumatic.     Right Ear: Tympanic membrane, ear canal and external ear normal. There is no impacted cerumen.     Left Ear: Tympanic membrane, ear canal and external ear normal. There is no impacted cerumen.     Nose: Nose normal. No congestion or rhinorrhea.     Mouth/Throat:     Mouth: Mucous membranes are moist.     Pharynx: Oropharynx is clear.  Eyes:     Extraocular Movements: Extraocular movements intact.     Conjunctiva/sclera: Conjunctivae normal.     Pupils: Pupils are equal, round, and reactive to light.  Neck:     Vascular: No carotid bruit.  Cardiovascular:     Rate and Rhythm: Normal rate and regular rhythm.     Pulses: Normal pulses.     Heart  sounds: No murmur heard.    No friction rub. No gallop.  Pulmonary:     Effort: Pulmonary effort is normal.     Breath sounds: Normal breath sounds.  Abdominal:     General: Abdomen is flat. Bowel sounds are normal. There is no distension.     Palpations: Abdomen is soft. There is no mass.     Tenderness: There is no abdominal tenderness. There is no guarding or rebound.     Hernia: No hernia is present.  Musculoskeletal:        General: Normal range of motion.     Cervical back: Normal range of motion and neck supple.  Lymphadenopathy:     Cervical: No cervical adenopathy.  Skin:    General: Skin is warm and dry.     Capillary Refill: Capillary refill takes less than 2 seconds.  Neurological:     General: No focal deficit present.     Mental Status: He is alert and oriented to person, place, and time.     Motor: Tremor present.  Psychiatric:        Mood and Affect: Mood normal.        Behavior: Behavior normal.        Thought Content: Thought content normal.        Judgment: Judgment normal.           Assessment & Plan:  1. Routine general medical examination at a health care facility (Primary) Today patient counseled on age appropriate routine health concerns for screening and prevention,  each reviewed and up to date or declined. Immunizations reviewed and up to date or declined. Labs ordered and reviewed. Risk factors for depression reviewed and negative. Hearing function and visual acuity are intact. ADLs screened and addressed as needed. Functional ability and level of safety reviewed and appropriate. Education, counseling and referrals performed based on assessed risks today. Patient provided with a copy of personalized plan for preventive services. - Follow up in one year or sooner if needed  2. Diet-controlled diabetes mellitus (HCC)  BS stable.  - Likely six month follow up  - Lipid panel; Future - TSH; Future - CBC; Future - Comprehensive metabolic panel with  GFR; Future - Hemoglobin A1c; Future - Microalbumin/Creatinine Ratio, Urine; Future  3. Primary hypertension - Controlled. No change in therapy - Lipid panel; Future - TSH; Future - CBC; Future - Comprehensive metabolic panel with GFR; Future  4. Hyperlipidemia, unspecified hyperlipidemia type - Consider increase in statin  - Lipid panel; Future - TSH; Future - CBC; Future - Comprehensive metabolic panel with GFR; Future  5. Coronary artery disease involving autologous vein coronary bypass graft without angina pectoris - per Cardiology  - Lipid panel; Future - TSH; Future - CBC; Future - Comprehensive metabolic panel with GFR; Future  6. Aortic valve replaced - Controlled.  - Lipid panel; Future - TSH; Future - CBC; Future - Comprehensive metabolic panel with GFR; Future  7. Primary osteoarthritis of both knees - Per Orthopedics  - Lipid panel; Future - TSH; Future - CBC; Future - Comprehensive metabolic panel with GFR; Future  8. Parkinson's disease, unspecified whether dyskinesia present, unspecified whether manifestations fluctuate (HCC) - Continue with plan of care by Neurology  - Lipid panel; Future - TSH; Future - CBC; Future - Comprehensive metabolic panel with GFR; Future  Darleene Shape, NP     [1]  Allergies Allergen Reactions   Bee Venom Shortness Of Breath and Swelling   Codeine Phosphate Hives, Shortness Of Breath, Itching, Rash and Other (See Comments)    Tolerated multiple doses of IV morphine    Metoprolol  Tartrate Other (See Comments)    loss of vision. Can take the XL tablet not regular    Pramipexole  Other (See Comments)    Hallucinations     Tramadol      Pt has hallucinations    Ramipril  Rash and Cough  [2]  Current Outpatient Medications on File Prior to Visit  Medication Sig Dispense Refill   ACCU-CHEK GUIDE TEST test strip USE AS INSTRUCTED 300 strip 3   Accu-Chek Softclix Lancets lancets USE TO CHECK BLOOD SUGAR TWICE A DAY AND  AS NEEDED 300 each 3   acetaminophen  (TYLENOL ) 650 MG CR tablet Take 1,300 mg by mouth 2 (two) times daily.     albuterol  (VENTOLIN  HFA) 108 (90 Base) MCG/ACT inhaler INHALE 2 PUFFS INTO THE LUNGS EVERY 6 HOURS AS NEEDED FOR WHEEZING OR SHORTNESS OF BREATH. 3 each 0   Ascorbic Acid  (VITAMIN C ) 1000 MG tablet Take 1,000 mg by mouth daily.     aspirin  81 MG chewable tablet Chew 1 tablet (81 mg total) by mouth daily.     atorvastatin  (LIPITOR) 80 MG tablet TAKE 1 TABLET EVERY DAY 90 tablet 1   calcium  carbonate (OS-CAL) 600 MG TABS Take 600 mg by mouth 2 (two) times daily with a meal.     carbidopa -levodopa  (SINEMET  CR) 50-200 MG tablet Take 1 tablet by mouth at bedtime.     carbidopa -levodopa  (SINEMET  IR) 25-100 MG tablet Take 1  tablet by mouth 4 (four) times daily.     cetirizine (ZYRTEC) 10 MG tablet Take 10 mg by mouth daily.      cholecalciferol  (VITAMIN D ) 25 MCG (1000 UNIT) tablet Take 1,000 Units by mouth every evening.     fluticasone  (FLONASE ) 50 MCG/ACT nasal spray Place 2 sprays into both nostrils daily. 16 g 6   furosemide  (LASIX ) 40 MG tablet TAKE 1 TABLET EVERY DAY 90 tablet 0   isosorbide  mononitrate (IMDUR ) 60 MG 24 hr tablet Take 1 tablet (60 mg total) by mouth daily. 90 tablet 3   melatonin 5 MG TABS Take 5 mg by mouth at bedtime. (Patient taking differently: Take 10 mg by mouth at bedtime.)     metoprolol  succinate (TOPROL -XL) 50 MG 24 hr tablet TAKE 1 TABLET (50 MG TOTAL) BY MOUTH DAILY. TAKE WITH OR IMMEDIATELY FOLLOWING A MEAL. 90 tablet 3   Misc Natural Products (TART CHERRY ADVANCED) CAPS Take 2 capsules by mouth daily.      Multiple Vitamin (MULTIVITAMIN) tablet Take 1 tablet by mouth daily.       nitroGLYCERIN  (NITROSTAT ) 0.4 MG SL tablet PLACE 1 TABLET (0.4 MG TOTAL) UNDER THE TONGUE EVERY 5 (FIVE) MINUTES AS NEEDED FOR CHEST PAIN 75 tablet 2   potassium chloride  (KLOR-CON ) 10 MEQ tablet TAKE 1 TABLET EVERY DAY 90 tablet 0   Probiotic Product (PHILLIPS COLON HEALTH PO)  Take 1 capsule by mouth daily.     vitamin B-12 (CYANOCOBALAMIN ) 1000 MCG tablet Take 1,000 mcg by mouth daily.     warfarin (COUMADIN ) 10 MG tablet TAKE 1/2 TO 1 TABLET DAILY OR AS DIRECTED BY COUMADIN  CLINIC 90 tablet 3   No current facility-administered medications on file prior to visit.

## 2024-06-27 ENCOUNTER — Ambulatory Visit: Payer: Medicare HMO

## 2024-06-27 VITALS — Ht 66.5 in | Wt 177.0 lb

## 2024-06-27 DIAGNOSIS — Z0000 Encounter for general adult medical examination without abnormal findings: Secondary | ICD-10-CM

## 2024-06-27 NOTE — Patient Instructions (Addendum)
 Mr. Delatte,  Thank you for taking the time for your Medicare Wellness Visit. I appreciate your continued commitment to your health goals. Please review the care plan we discussed, and feel free to reach out if I can assist you further.  Please note that Annual Wellness Visits do not include a physical exam. Some assessments may be limited, especially if the visit was conducted virtually. If needed, we may recommend an in-person follow-up with your provider.  Ongoing Care Seeing your primary care provider every 3 to 6 months helps us  monitor your health and provide consistent, personalized care.   Referrals If a referral was made during today's visit and you haven't received any updates within two weeks, please contact the referred provider directly to check on the status.  Recommended Screenings:  Health Maintenance  Topic Date Due   DTaP/Tdap/Td vaccine (3 - Td or Tdap) 07/16/2022   COVID-19 Vaccine (1 - 2025-26 season) Never done   Complete foot exam   06/22/2024   Eye exam for diabetics  11/09/2024   Hemoglobin A1C  12/25/2024   Yearly kidney function blood test for diabetes  06/26/2025   Yearly kidney health urinalysis for diabetes  06/26/2025   Medicare Annual Wellness Visit  06/27/2025   Pneumococcal Vaccine for age over 40  Completed   Flu Shot  Completed   Zoster (Shingles) Vaccine  Completed   Meningitis B Vaccine  Aged Out   Colon Cancer Screening  Discontinued   Hepatitis C Screening  Discontinued       06/27/2024   11:34 AM  Advanced Directives  Does Patient Have a Medical Advance Directive? No  Would patient like information on creating a medical advance directive? No - Patient declined    Vision: Annual vision screenings are recommended for early detection of glaucoma, cataracts, and diabetic retinopathy. These exams can also reveal signs of chronic conditions such as diabetes and high blood pressure.  Dental: Annual dental screenings help detect early signs  of oral cancer, gum disease, and other conditions linked to overall health, including heart disease and diabetes.  Please see the attached documents for additional preventive care recommendations.

## 2024-06-27 NOTE — Progress Notes (Signed)
 Chief Complaint  Patient presents with   Medicare Wellness     Subjective:   [name] is a 78 y.o. male who presents for a Medicare Annual Wellness Visit.  Visit info / Clinical Intake: Medicare Wellness Visit Type:: Subsequent Annual Wellness Visit Persons participating in visit and providing information:: patient Medicare Wellness Visit Mode:: Telephone If telephone:: video declined Since this visit was completed virtually, some vitals may be partially provided or unavailable. Missing vitals are due to the limitations of the virtual format.: Documented vitals are patient reported If Telephone or Video please confirm:: I connected with patient using audio/video enable telemedicine. I verified patient identity with two identifiers, discussed telehealth limitations, and patient agreed to proceed. Patient Location:: Home Provider Location:: Office Interpreter Needed?: No Pre-visit prep was completed: yes AWV questionnaire completed by patient prior to visit?: no Living arrangements:: lives with spouse/significant other Patient's Overall Health Status Rating: (!) fair Typical amount of pain: none Does pain affect daily life?: no Are you currently prescribed opioids?: no  Dietary Habits and Nutritional Risks How many meals a day?: 2 Eats fruit and vegetables daily?: yes Most meals are obtained by: preparing own meals In the last 2 weeks, have you had any of the following?: none Diabetic:: (!) yes Any non-healing wounds?: no How often do you check your BS?: 3 Would you like to be referred to a Nutritionist or for Diabetic Management? : no  Functional Status Activities of Daily Living (to include ambulation/medication): Independent Ambulation: Independent with device- listed below Home Assistive Devices/Equipment: Eyeglasses; Cane Medication Administration: Independent Home Management (perform basic housework or laundry): Independent Manage your own finances?:  yes Primary transportation is: family / friends (Wife) Concerns about vision?: no *vision screening is required for WTM* Concerns about hearing?: (!) yes Uses hearing aids?: (!) yes Hear whispered voice?: (!) no *in-person visit only*  Fall Screening Falls in the past year?: 1 Number of falls in past year: 0 Was there an injury with Fall?: 1 (Injury to rt hand.No medical attention needed) Fall Risk Category Calculator: 2 Patient Fall Risk Level: Moderate Fall Risk  Fall Risk Patient at Risk for Falls Due to: Impaired balance/gait Fall risk Follow up: Falls evaluation completed; Falls prevention discussed  Home and Transportation Safety: All rugs have non-skid backing?: yes All stairs or steps have railings?: yes Grab bars in the bathtub or shower?: yes Have non-skid surface in bathtub or shower?: yes Good home lighting?: yes Regular seat belt use?: yes Hospital stays in the last year:: no  Cognitive Assessment Difficulty concentrating, remembering, or making decisions? : no Will 6CIT or Mini Cog be Completed: yes What year is it?: 0 points What month is it?: 0 points Give patient an address phrase to remember (5 components): [address] [place]  About what time is it?: 0 points Count backwards from 20 to 1: 0 points Say the months of the year in reverse: 0 points Repeat the address phrase from earlier: 8 points 6 CIT Score: 8 points  Advance Directives (For Healthcare) Does Patient Have a Medical Advance Directive?: No Would patient like information on creating a medical advance directive?: No - Patient declined  Reviewed/Updated  Reviewed/Updated: Reviewed All (Medical, Surgical, Family, Medications, Allergies, Care Teams, Patient Goals)    Allergies (verified) Bee venom, Codeine phosphate, Metoprolol  tartrate, Pramipexole , Tramadol , and Ramipril    Current Medications (verified) Outpatient Encounter Medications as of 06/27/2024  Medication Sig    ACCU-CHEK GUIDE TEST test strip USE AS INSTRUCTED  Accu-Chek Softclix Lancets lancets USE TO CHECK BLOOD SUGAR TWICE A DAY AND AS NEEDED   acetaminophen  (TYLENOL ) 650 MG CR tablet Take 1,300 mg by mouth 2 (two) times daily.   albuterol  (VENTOLIN  HFA) 108 (90 Base) MCG/ACT inhaler INHALE 2 PUFFS INTO THE LUNGS EVERY 6 HOURS AS NEEDED FOR WHEEZING OR SHORTNESS OF BREATH.   Ascorbic Acid  (VITAMIN C ) 1000 MG tablet Take 1,000 mg by mouth daily.   aspirin  81 MG chewable tablet Chew 1 tablet (81 mg total) by mouth daily.   atorvastatin  (LIPITOR) 80 MG tablet TAKE 1 TABLET EVERY DAY   calcium  carbonate (OS-CAL) 600 MG TABS Take 600 mg by mouth 2 (two) times daily with a meal.   carbidopa -levodopa  (SINEMET  CR) 50-200 MG tablet Take 1 tablet by mouth at bedtime.   carbidopa -levodopa  (SINEMET  IR) 25-100 MG tablet Take 1 tablet by mouth 4 (four) times daily.   cetirizine (ZYRTEC) 10 MG tablet Take 10 mg by mouth daily.    cholecalciferol  (VITAMIN D ) 25 MCG (1000 UNIT) tablet Take 1,000 Units by mouth every evening.   fluticasone  (FLONASE ) 50 MCG/ACT nasal spray Place 2 sprays into both nostrils daily.   furosemide  (LASIX ) 40 MG tablet TAKE 1 TABLET EVERY DAY   isosorbide  mononitrate (IMDUR ) 60 MG 24 hr tablet Take 1 tablet (60 mg total) by mouth daily.   melatonin 5 MG TABS Take 5 mg by mouth at bedtime. (Patient taking differently: Take 10 mg by mouth at bedtime.)   metoprolol  succinate (TOPROL -XL) 50 MG 24 hr tablet TAKE 1 TABLET (50 MG TOTAL) BY MOUTH DAILY. TAKE WITH OR IMMEDIATELY FOLLOWING A MEAL.   Misc Natural Products (TART CHERRY ADVANCED) CAPS Take 2 capsules by mouth daily.    Multiple Vitamin (MULTIVITAMIN) tablet Take 1 tablet by mouth daily.     nitroGLYCERIN  (NITROSTAT ) 0.4 MG SL tablet PLACE 1 TABLET (0.4 MG TOTAL) UNDER THE TONGUE EVERY 5 (FIVE) MINUTES AS NEEDED FOR CHEST PAIN   potassium chloride  (KLOR-CON ) 10 MEQ tablet TAKE 1 TABLET EVERY DAY   Probiotic Product (PHILLIPS COLON  HEALTH PO) Take 1 capsule by mouth daily.   vitamin B-12 (CYANOCOBALAMIN ) 1000 MCG tablet Take 1,000 mcg by mouth daily.   warfarin (COUMADIN ) 10 MG tablet TAKE 1/2 TO 1 TABLET DAILY OR AS DIRECTED BY COUMADIN  CLINIC   No facility-administered encounter medications on file as of 06/27/2024.    History: Past Medical History:  Diagnosis Date   Acute renal failure 02/09/2013   Aortic stenosis 08/25/2010   a. [place] aortic root replacement with a St. Jude mechanical valve and Hemashield conduit 02/2004.   Arthritis    Asthma    AV BLOCK, COMPLETE    a. s/p St Jude dual chamber pacemaker 02/2004.   CAD (coronary artery disease)    a. s/p CABGx2 (LIMA-dLAD, SVG-Cx). b. Low risk nuc 12/2012 without ischemia, EF 46% mild apical hypokinesia (EF 55% inf HK by echo).   Carotid artery disease    a. 0-39% bilateral ICA stenosis, stable mild hard plaque in carotid bulbs. F/u 03/2014 recommended.   Diabetes mellitus without complication (HCC)    type 2    DIVERTICULOSIS, COLON 10/17/2007   Ejection fraction    a. EF 55% with inf HK, mild MR by echo 12/2012.   GERD (gastroesophageal reflux disease)    hxof    GOUT 01/16/2007   Headache    hx of migraines    HEMORRHOIDS, INTERNAL 11/01/2008   Hx of adenomatous polyp of colon 02/2019  no recall needed   HYPERLIPIDEMIA 01/16/2007   HYPERTENSION 01/16/2007   Impaired glucose tolerance    LEG EDEMA 11/27/2008   Special screening for malignant neoplasm of prostate    Warfarin anticoagulation    AVR   Past Surgical History:  Procedure Laterality Date   AORTIC VALVE REPLACEMENT     CARDIAC CATHETERIZATION  06/2003   CIRCUMCISION     COLONOSCOPY     CORONARY ARTERY BYPASS GRAFT     CORONARY ATHERECTOMY N/A 05/28/2022   Procedure: CORONARY ATHERECTOMY;  Surgeon: Jordan, Peter M, MD;  Location: [facility] INVASIVE CV LAB;  Service: Cardiovascular;  Laterality: N/A;   CORONARY LITHOTRIPSY N/A 05/28/2022   Procedure: CORONARY LITHOTRIPSY;  Surgeon: Jordan, Peter M,  MD;  Location: [facility] INVASIVE CV LAB;  Service: Cardiovascular;  Laterality: N/A;   CORONARY STENT INTERVENTION N/A 05/28/2022   Procedure: CORONARY STENT INTERVENTION;  Surgeon: Jordan, Peter M, MD;  Location: [facility] INVASIVE CV LAB;  Service: Cardiovascular;  Laterality: N/A;   CORONARY STENT INTERVENTION N/A 01/03/2023   Procedure: CORONARY STENT INTERVENTION;  Surgeon: Jordan, Peter M, MD;  Location: [facility] INVASIVE CV LAB;  Service: Cardiovascular;  Laterality: N/A;   coronary stents      prior to bypass    EP IMPLANTABLE DEVICE N/A 05/06/2016   Procedure: PPM Generator Changeout;  Surgeon: Danelle LELON Birmingham, MD;  Location: [facility] INVASIVE CV LAB;  Service: Cardiovascular;  Laterality: N/A;   ESOPHAGOGASTRODUODENOSCOPY     HERNIA REPAIR     INGUINAL HERNIA REPAIR Bilateral 02/07/2013   Procedure: LAPAROSCOPIC BILATERAL INGUINAL HERNIA REPAIR;  Surgeon: Donnice POUR. Belinda, MD;  Location: [facility] ORS;  Service: General;  Laterality: Bilateral;   INSERTION OF MESH Bilateral 02/07/2013   Procedure: INSERTION OF MESH;  Surgeon: Donnice POUR. Belinda, MD;  Location: [facility] ORS;  Service: General;  Laterality: Bilateral;   LEFT HEART CATH AND CORS/GRAFTS ANGIOGRAPHY N/A 05/27/2022   Procedure: LEFT HEART CATH AND CORS/GRAFTS ANGIOGRAPHY;  Surgeon: Dann Candyce RAMAN, MD;  Location: [facility] INVASIVE CV LAB;  Service: Cardiovascular;  Laterality: N/A;   LEFT HEART CATH AND CORS/GRAFTS ANGIOGRAPHY N/A 01/03/2023   Procedure: LEFT HEART CATH AND CORS/GRAFTS ANGIOGRAPHY;  Surgeon: Jordan, Peter M, MD;  Location: [facility] INVASIVE CV LAB;  Service: Cardiovascular;  Laterality: N/A;   LUMBAR LAMINECTOMY/DECOMPRESSION MICRODISCECTOMY N/A 03/15/2018   Procedure: Complete decompressive lumbar laminectomy for spinal stenosis of L4-L5 and foraminotomy for L4-L5 root on right;  Surgeon: Heide Ingle, MD;  Location: [facility] OR[facility];  Service: Orthopedics;  Laterality: N/A;   PACEMAKER INSERTION     SHOULDER SURGERY Bilateral    SKIN BIOPSY  05/04/2019   BASAL  CELL CARCINOMA//MID CENTRAL NASAL   SKIN BIOPSY  10/11/2019   Superficial basal cell carcinoma - left superior central forehead shave   Family History  Problem Relation Age of Onset   Heart attack Father    Kidney cancer Father    Hypertension Mother    Diabetes Mother    Diabetes Brother    Epilepsy Sister    Diabetes Sister    Diabetes Brother    Colon cancer Neg Hx    Rectal cancer Neg Hx    Stomach cancer Neg Hx    Esophageal cancer Neg Hx    Social History   Occupational History   Occupation: retired  Tobacco Use   Smoking status: Former    Current packs/day: 0.00    Types: Cigarettes    Quit date: 07/12/1973    Years since quitting: 50.9  Smokeless tobacco: Never  Vaping Use   Vaping status: Never Used  Substance and Sexual Activity   Alcohol use: No   Drug use: No   Sexual activity: Not on file   Tobacco Counseling Counseling given: No  SDOH Screenings   Food Insecurity: No Food Insecurity (06/27/2024)  Housing: Low Risk (06/27/2024)  Transportation Needs: No Transportation Needs (06/27/2024)  Utilities: Not At Risk (06/27/2024)  Alcohol Screen: Low Risk (06/17/2022)  Depression (PHQ2-9): Low Risk (06/27/2024)  Financial Resource Strain: Low Risk (06/26/2024)  Physical Activity: Inactive (06/27/2024)  Social Connections: Socially Isolated (06/27/2024)  Stress: No Stress Concern Present (06/27/2024)  Tobacco Use: Medium Risk (06/27/2024)  Health Literacy: Adequate Health Literacy (06/27/2024)   See flowsheets for full screening details  Depression Screen PHQ 2 & 9 Depression Scale- Over the past 2 weeks, how often have you been bothered by any of the following problems? Little interest or pleasure in doing things: 0 Feeling down, depressed, or hopeless (PHQ Adolescent also includes...irritable): 0 PHQ-2 Total Score: 0     Goals Addressed               This Visit's Progress     Remain active (pt-stated)               Objective:     Today's Vitals   06/27/24 1134  Weight: 177 lb (80.3 kg)  Height: 5' 6.5 (1.689 m)   Body mass index is 28.14 kg/m.  Hearing/Vision screen Hearing Screening - Comments:: Wears Hearing Aids Vision Screening - Comments:: Wears rx glasses - up to date with routine eye exams with  [facility] Immunizations and Health Maintenance Health Maintenance  Topic Date Due   DTaP/Tdap/Td (3 - Td or Tdap) 07/16/2022   COVID-19 Vaccine (1 - 2025-26 season) Never done   FOOT EXAM  06/22/2024   OPHTHALMOLOGY EXAM  11/09/2024   HEMOGLOBIN A1C  12/25/2024   Diabetic kidney evaluation - eGFR measurement  06/26/2025   Diabetic kidney evaluation - Urine ACR  06/26/2025   Medicare Annual Wellness (AWV)  06/27/2025   Pneumococcal Vaccine: 50+ Years  Completed   Influenza Vaccine  Completed   Zoster Vaccines- Shingrix  Completed   Meningococcal B Vaccine  Aged Out   Colonoscopy  Discontinued   Hepatitis C Screening  Discontinued        Assessment/Plan:  This is a routine wellness examination for [name].  Patient Care Team: Merna Huxley, NP as PCP - General (Family Medicine) Jeffrie Oneil BROCKS, MD as PCP - Cardiology (Cardiology) Waddell Danelle ORN, MD as PCP - Electrophysiology (Cardiology) Heide Ingle, MD as Consulting Physician (Orthopedic Surgery) Lynnell Nottingham, MD as Consulting Physician (Dermatology) Onasanya, Olukayode O, MD as Referring Physician (Neurology)  I have personally reviewed and noted the following in the patients chart:   Medical and social history Use of alcohol, tobacco or illicit drugs  Current medications and supplements including opioid prescriptions. Functional ability and status Nutritional status Physical activity Advanced directives List of other physicians Hospitalizations, surgeries, and ER visits in previous 12 months Vitals Screenings to include cognitive, depression, and falls Referrals and appointments  No orders of the defined types were  placed in this encounter.  In addition, I have reviewed and discussed with patient certain preventive protocols, quality metrics, and best practice recommendations. A written personalized care plan for preventive services as well as general preventive health recommendations were provided to patient.   Jeremy ORN Blush, LPN   [id number]   Return in  53 weeks (on 07/03/2025).  After Visit Summary: (MyChart) Due to this being a telephonic visit, the after visit summary with patients personalized plan was offered to patient via MyChart   Nurse Notes: No voiced or noted concerns at this time

## 2024-06-27 NOTE — Telephone Encounter (Signed)
 [id number]

## 2024-06-30 ENCOUNTER — Encounter: Payer: Self-pay | Admitting: Adult Health

## 2024-07-03 ENCOUNTER — Telehealth: Payer: Self-pay

## 2024-07-03 NOTE — Telephone Encounter (Signed)
 Entered in error

## 2024-07-20 ENCOUNTER — Ambulatory Visit: Attending: Cardiology

## 2024-07-20 DIAGNOSIS — Z09 Encounter for follow-up examination after completed treatment for conditions other than malignant neoplasm: Secondary | ICD-10-CM

## 2024-07-20 DIAGNOSIS — Z5181 Encounter for therapeutic drug level monitoring: Secondary | ICD-10-CM | POA: Diagnosis not present

## 2024-07-20 DIAGNOSIS — Z952 Presence of prosthetic heart valve: Secondary | ICD-10-CM | POA: Diagnosis not present

## 2024-07-20 LAB — POCT INR: INR: 2.3 (ref 2.0–3.0)

## 2024-07-20 NOTE — Progress Notes (Signed)
"   INR 2.3 Please see anticoagulation encounter Continue taking Warfarin 1 tablet daily except for 1/2 tablet on Tuesdays and Thursdays. Remain consistent with green leafy veggies. Recheck INR in 7 weeks.  Coumadin  Clinic [phone] "

## 2024-07-20 NOTE — Patient Instructions (Signed)
 Continue taking Warfarin 1 tablet daily except for 1/2 tablet on Tuesdays and Thursdays. Remain consistent with green leafy veggies. Recheck INR in 7 weeks.  Coumadin  Clinic [phone]

## 2024-07-27 ENCOUNTER — Other Ambulatory Visit: Payer: Self-pay | Admitting: Cardiology

## 2024-07-27 ENCOUNTER — Other Ambulatory Visit: Payer: Self-pay | Admitting: Adult Health

## 2024-07-27 DIAGNOSIS — I10 Essential (primary) hypertension: Secondary | ICD-10-CM

## 2024-08-02 ENCOUNTER — Ambulatory Visit

## 2024-08-02 DIAGNOSIS — I442 Atrioventricular block, complete: Secondary | ICD-10-CM | POA: Diagnosis not present

## 2024-08-02 LAB — CUP PACEART REMOTE DEVICE CHECK
Battery Remaining Longevity: 29 mo
Battery Remaining Percentage: 23 %
Battery Voltage: 2.9 V
Brady Statistic AP VP Percent: 33 %
Brady Statistic AP VS Percent: 1 %
Brady Statistic AS VP Percent: 67 %
Brady Statistic AS VS Percent: 1 %
Brady Statistic RA Percent Paced: 32 %
Brady Statistic RV Percent Paced: 99 %
Date Time Interrogation Session: 20260122020015
Implantable Lead Connection Status: 753985
Implantable Lead Connection Status: 753985
Implantable Lead Implant Date: 20050816
Implantable Lead Implant Date: 20050816
Implantable Lead Location: 753859
Implantable Lead Location: 753860
Implantable Pulse Generator Implant Date: 20171026
Lead Channel Impedance Value: 490 Ohm
Lead Channel Impedance Value: 610 Ohm
Lead Channel Pacing Threshold Amplitude: 0.5 V
Lead Channel Pacing Threshold Amplitude: 1 V
Lead Channel Pacing Threshold Pulse Width: 0.5 ms
Lead Channel Pacing Threshold Pulse Width: 0.5 ms
Lead Channel Sensing Intrinsic Amplitude: 12 mV
Lead Channel Sensing Intrinsic Amplitude: 2.9 mV
Lead Channel Setting Pacing Amplitude: 0.75 V
Lead Channel Setting Pacing Amplitude: 2 V
Lead Channel Setting Pacing Pulse Width: 0.5 ms
Lead Channel Setting Sensing Sensitivity: 4 mV
Pulse Gen Model: 2272
Pulse Gen Serial Number: 3182792

## 2024-08-04 NOTE — Progress Notes (Signed)
 Remote PPM Transmission

## 2024-08-12 ENCOUNTER — Ambulatory Visit: Payer: Self-pay | Admitting: Student in an Organized Health Care Education/Training Program

## 2024-09-07 ENCOUNTER — Ambulatory Visit

## 2024-11-01 ENCOUNTER — Encounter

## 2024-12-19 ENCOUNTER — Ambulatory Visit: Admitting: Adult Health

## 2025-01-31 ENCOUNTER — Encounter

## 2025-05-02 ENCOUNTER — Encounter

## 2025-07-03 ENCOUNTER — Ambulatory Visit
